# Patient Record
Sex: Male | Born: 1945
Health system: Southern US, Community
[De-identification: ages and names within clinical notes are randomized; demographics above are authoritative.]

## PROBLEM LIST (undated history)

## (undated) DIAGNOSIS — M1990 Unspecified osteoarthritis, unspecified site: Secondary | ICD-10-CM

## (undated) DIAGNOSIS — M109 Gout, unspecified: Secondary | ICD-10-CM

## (undated) DIAGNOSIS — Z8619 Personal history of other infectious and parasitic diseases: Secondary | ICD-10-CM

## (undated) DIAGNOSIS — H1850 Unspecified hereditary corneal dystrophies: Secondary | ICD-10-CM

## (undated) DIAGNOSIS — E119 Type 2 diabetes mellitus without complications: Secondary | ICD-10-CM

## (undated) DIAGNOSIS — E785 Hyperlipidemia, unspecified: Secondary | ICD-10-CM

## (undated) DIAGNOSIS — G629 Polyneuropathy, unspecified: Secondary | ICD-10-CM

## (undated) DIAGNOSIS — K219 Gastro-esophageal reflux disease without esophagitis: Secondary | ICD-10-CM

## (undated) DIAGNOSIS — I509 Heart failure, unspecified: Secondary | ICD-10-CM

## (undated) DIAGNOSIS — H18509 Unspecified hereditary corneal dystrophies, unspecified eye: Secondary | ICD-10-CM

## (undated) DIAGNOSIS — I219 Acute myocardial infarction, unspecified: Secondary | ICD-10-CM

## (undated) DIAGNOSIS — K5090 Crohn's disease, unspecified, without complications: Secondary | ICD-10-CM

## (undated) DIAGNOSIS — G4730 Sleep apnea, unspecified: Secondary | ICD-10-CM

## (undated) DIAGNOSIS — H18459 Nodular corneal degeneration, unspecified eye: Secondary | ICD-10-CM

## (undated) DIAGNOSIS — I2510 Atherosclerotic heart disease of native coronary artery without angina pectoris: Secondary | ICD-10-CM

## (undated) DIAGNOSIS — S301XXA Contusion of abdominal wall, initial encounter: Secondary | ICD-10-CM

## (undated) DIAGNOSIS — I701 Atherosclerosis of renal artery: Secondary | ICD-10-CM

## (undated) DIAGNOSIS — K635 Polyp of colon: Secondary | ICD-10-CM

## (undated) DIAGNOSIS — U071 COVID-19: Secondary | ICD-10-CM

## (undated) DIAGNOSIS — M4800 Spinal stenosis, site unspecified: Secondary | ICD-10-CM

## (undated) DIAGNOSIS — K279 Peptic ulcer, site unspecified, unspecified as acute or chronic, without hemorrhage or perforation: Secondary | ICD-10-CM

## (undated) DIAGNOSIS — Z87442 Personal history of urinary calculi: Secondary | ICD-10-CM

## (undated) DIAGNOSIS — I4891 Unspecified atrial fibrillation: Secondary | ICD-10-CM

## (undated) DIAGNOSIS — I499 Cardiac arrhythmia, unspecified: Secondary | ICD-10-CM

## (undated) DIAGNOSIS — N200 Calculus of kidney: Secondary | ICD-10-CM

## (undated) DIAGNOSIS — I10 Essential (primary) hypertension: Secondary | ICD-10-CM

## (undated) DIAGNOSIS — B019 Varicella without complication: Secondary | ICD-10-CM

## (undated) HISTORY — PX: TONSILLECTOMY: SUR1361

## (undated) HISTORY — PX: RENAL ARTERY STENT: SHX2321

## (undated) HISTORY — PX: JOINT REPLACEMENT: SHX530

## (undated) HISTORY — PX: CARDIAC CATHETERIZATION: SHX172

## (undated) HISTORY — PX: KNEE ARTHROSCOPY: SUR90

## (undated) HISTORY — DX: Varicella without complication: B01.9

## (undated) HISTORY — PX: CARDIOVERSION: SHX1299

## (undated) HISTORY — DX: Atherosclerotic heart disease of native coronary artery without angina pectoris: I25.10

## (undated) HISTORY — PX: CORONARY ANGIOPLASTY: SHX604

## (undated) HISTORY — PX: BACK SURGERY: SHX140

## (undated) HISTORY — DX: Contusion of abdominal wall, initial encounter: S30.1XXA

## (undated) HISTORY — PX: CARDIAC SURGERY: SHX584

## (undated) HISTORY — PX: CHOLECYSTECTOMY: SHX55

## (undated) HISTORY — DX: Polyp of colon: K63.5

## (undated) HISTORY — PX: CARDIAC ELECTROPHYSIOLOGY STUDY AND ABLATION: SHX1294

## (undated) HISTORY — DX: Calculus of kidney: N20.0

## (undated) HISTORY — PX: EYE SURGERY: SHX253

## (undated) HISTORY — DX: COVID-19: U07.1

## (undated) HISTORY — PX: FRACTURE SURGERY: SHX138

## (undated) HISTORY — DX: Type 2 diabetes mellitus without complications: E11.9

## (undated) HISTORY — PX: OTHER SURGICAL HISTORY: SHX169

## (undated) HISTORY — PX: CORONARY ARTERY BYPASS GRAFT: SHX141

---

## 2000-09-13 DIAGNOSIS — E119 Type 2 diabetes mellitus without complications: Secondary | ICD-10-CM

## 2000-09-13 HISTORY — DX: Type 2 diabetes mellitus without complications: E11.9

## 2003-09-14 HISTORY — PX: OTHER SURGICAL HISTORY: SHX169

## 2004-09-13 HISTORY — PX: ROTATOR CUFF REPAIR: SHX139

## 2009-12-17 ENCOUNTER — Ambulatory Visit: Payer: Self-pay | Admitting: Unknown Physician Specialty

## 2010-06-03 ENCOUNTER — Ambulatory Visit: Payer: Self-pay | Admitting: Gastroenterology

## 2010-09-13 DIAGNOSIS — H18459 Nodular corneal degeneration, unspecified eye: Secondary | ICD-10-CM

## 2010-09-13 HISTORY — DX: Nodular corneal degeneration, unspecified eye: H18.459

## 2010-09-13 HISTORY — PX: ABLATION: SHX5711

## 2010-11-16 ENCOUNTER — Ambulatory Visit: Payer: Self-pay

## 2010-12-13 ENCOUNTER — Ambulatory Visit: Payer: Self-pay

## 2011-05-10 ENCOUNTER — Encounter: Payer: Self-pay | Admitting: Internal Medicine

## 2011-05-12 ENCOUNTER — Encounter: Payer: Self-pay | Admitting: Internal Medicine

## 2011-05-15 ENCOUNTER — Encounter: Payer: Self-pay | Admitting: Internal Medicine

## 2011-06-14 ENCOUNTER — Encounter: Payer: Self-pay | Admitting: Internal Medicine

## 2011-06-30 DIAGNOSIS — I10 Essential (primary) hypertension: Secondary | ICD-10-CM | POA: Insufficient documentation

## 2011-06-30 DIAGNOSIS — I2510 Atherosclerotic heart disease of native coronary artery without angina pectoris: Secondary | ICD-10-CM | POA: Insufficient documentation

## 2011-06-30 DIAGNOSIS — E785 Hyperlipidemia, unspecified: Secondary | ICD-10-CM | POA: Insufficient documentation

## 2011-06-30 DIAGNOSIS — I4891 Unspecified atrial fibrillation: Secondary | ICD-10-CM | POA: Insufficient documentation

## 2011-07-01 DIAGNOSIS — I701 Atherosclerosis of renal artery: Secondary | ICD-10-CM | POA: Insufficient documentation

## 2011-07-15 ENCOUNTER — Encounter: Payer: Self-pay | Admitting: Internal Medicine

## 2011-07-29 DIAGNOSIS — Z7901 Long term (current) use of anticoagulants: Secondary | ICD-10-CM | POA: Insufficient documentation

## 2011-08-14 ENCOUNTER — Encounter: Payer: Self-pay | Admitting: Internal Medicine

## 2012-09-08 ENCOUNTER — Emergency Department: Payer: Self-pay | Admitting: Emergency Medicine

## 2013-04-23 ENCOUNTER — Ambulatory Visit: Payer: Self-pay

## 2013-04-23 LAB — CREATININE, SERUM: Creatinine: 1.16 mg/dL (ref 0.60–1.30)

## 2013-05-01 HISTORY — PX: OTHER SURGICAL HISTORY: SHX169

## 2013-05-03 HISTORY — PX: OTHER SURGICAL HISTORY: SHX169

## 2013-05-04 DIAGNOSIS — M109 Gout, unspecified: Secondary | ICD-10-CM | POA: Insufficient documentation

## 2013-07-03 ENCOUNTER — Other Ambulatory Visit: Payer: Self-pay | Admitting: Rheumatology

## 2013-07-03 LAB — SYNOVIAL CELL COUNT + DIFF, W/ CRYSTALS
Basophil: 0 %
Eosinophil: 0 %
Neutrophils: 7 %
Nucleated Cell Count: 470 /mm3
Other Cells BF: 0 %
Other Mononuclear Cells: 27 %

## 2014-06-18 ENCOUNTER — Ambulatory Visit: Payer: Self-pay | Admitting: Rheumatology

## 2014-06-18 LAB — SYNOVIAL CELL COUNT + DIFF, W/ CRYSTALS
Basophil: 0 %
Eosinophil: 0 %
Lymphocytes: 24 %
NEUTROS PCT: 52 %
Nucleated Cell Count: 94 /mm3
OTHER MONONUCLEAR CELLS: 24 %
Other Cells BF: 0 %

## 2014-07-15 DIAGNOSIS — E1142 Type 2 diabetes mellitus with diabetic polyneuropathy: Secondary | ICD-10-CM | POA: Insufficient documentation

## 2014-10-11 ENCOUNTER — Ambulatory Visit: Payer: Self-pay | Admitting: Psychiatry

## 2015-01-18 ENCOUNTER — Emergency Department
Admission: EM | Admit: 2015-01-18 | Discharge: 2015-01-18 | Disposition: A | Discharge status: Home / Self Care | Payer: Medicare Other | Attending: Emergency Medicine | Admitting: Emergency Medicine

## 2015-01-18 ENCOUNTER — Emergency Department: Payer: Medicare Other

## 2015-01-18 ENCOUNTER — Encounter: Payer: Self-pay | Admitting: Emergency Medicine

## 2015-01-18 DIAGNOSIS — Z794 Long term (current) use of insulin: Secondary | ICD-10-CM | POA: Insufficient documentation

## 2015-01-18 DIAGNOSIS — R202 Paresthesia of skin: Secondary | ICD-10-CM | POA: Diagnosis not present

## 2015-01-18 DIAGNOSIS — Z7901 Long term (current) use of anticoagulants: Secondary | ICD-10-CM | POA: Insufficient documentation

## 2015-01-18 DIAGNOSIS — R002 Palpitations: Secondary | ICD-10-CM

## 2015-01-18 DIAGNOSIS — Z79899 Other long term (current) drug therapy: Secondary | ICD-10-CM | POA: Diagnosis not present

## 2015-01-18 DIAGNOSIS — Z791 Long term (current) use of non-steroidal anti-inflammatories (NSAID): Secondary | ICD-10-CM | POA: Insufficient documentation

## 2015-01-18 DIAGNOSIS — Z7952 Long term (current) use of systemic steroids: Secondary | ICD-10-CM | POA: Diagnosis not present

## 2015-01-18 DIAGNOSIS — Z7982 Long term (current) use of aspirin: Secondary | ICD-10-CM | POA: Insufficient documentation

## 2015-01-18 HISTORY — DX: Acute myocardial infarction, unspecified: I21.9

## 2015-01-18 LAB — CBC
HEMATOCRIT: 37.7 % — AB (ref 40.0–52.0)
HEMOGLOBIN: 12.8 g/dL — AB (ref 13.0–18.0)
MCH: 33.8 pg (ref 26.0–34.0)
MCHC: 34 g/dL (ref 32.0–36.0)
MCV: 99.4 fL (ref 80.0–100.0)
Platelets: 214 10*3/uL (ref 150–440)
RBC: 3.8 MIL/uL — AB (ref 4.40–5.90)
RDW: 14.1 % (ref 11.5–14.5)
WBC: 9.2 10*3/uL (ref 3.8–10.6)

## 2015-01-18 LAB — BASIC METABOLIC PANEL
Anion gap: 12 (ref 5–15)
BUN: 23 mg/dL — ABNORMAL HIGH (ref 6–20)
CHLORIDE: 97 mmol/L — AB (ref 101–111)
CO2: 25 mmol/L (ref 22–32)
Calcium: 9 mg/dL (ref 8.9–10.3)
Creatinine, Ser: 0.98 mg/dL (ref 0.61–1.24)
GFR calc Af Amer: >60 mL/min (ref >60)
GFR calc non Af Amer: >60 mL/min (ref >60)
Glucose, Bld: 172 mg/dL — ABNORMAL HIGH (ref 65–99)
Potassium: 3.8 mmol/L (ref 3.5–5.1)
Sodium: 134 mmol/L — ABNORMAL LOW (ref 135–145)

## 2015-01-18 LAB — PROTIME-INR
INR: 2.32
Prothrombin Time: 25.6 seconds — ABNORMAL HIGH (ref 11.4–15.0)

## 2015-01-18 LAB — TROPONIN I: Troponin I: <0.03 ng/mL (ref <0.031)

## 2015-01-18 MED ORDER — ASPIRIN 81 MG PO CHEW
CHEWABLE_TABLET | ORAL | Status: AC
  Administered 2015-01-18: 324 mg via ORAL
  Filled 2015-01-18: qty 4

## 2015-01-18 MED ORDER — ASPIRIN 81 MG PO CHEW
324.0000 mg | CHEWABLE_TABLET | Freq: Once | ORAL | Status: AC
  Administered 2015-01-18: 324 mg via ORAL

## 2015-01-18 NOTE — ED Provider Notes (Signed)
[facility] Emergency Department Provider Note    ____________________________________________  Time seen: 5 AM  I have reviewed the triage vital signs and the nursing notes.   HISTORY  Chief Complaint Palpitations and Tingling       HPI Chase Cabrera is a 69 y.o. male who states she went to bed at 2 AM this morning, while getting into bed he noticed that his heart felt like it was pounding slightly hard but not fast. He then developed a feeling of tingling in both hands and around his lips. He denies chest pain or trouble breathing. He feels improved now in the ER. Patient does state that he had a prior MI approximately 10 years ago where he had tingling in his left arm, but he is also had MI where he felt pressure in his chest and does not feel those symptoms today.He denies trouble with shortness of breath while lying flat, he denies swelling in the legs. He occasionally feels winded while walking, but this is not unusual for him.  Patient notes that he follows up regularly with cardiology at [facility].   Patient denies fever, cough. No pain.  Past Medical History  Diagnosis Date  . Myocardial infarction    non-ST elevation MI, atrial fibrillation status post ablation, diabetes  There are no active problems to display for this patient.   History reviewed. No pertinent past surgical history.  Current Outpatient Rx  Name  Route  Sig  Dispense  Refill  . aspirin EC 81 MG tablet   Oral   Take 1 tablet by mouth daily.         Marland Kitchen loratadine (CLARITIN) 10 MG tablet   Oral   Take 1 tablet by mouth daily before breakfast.         . Multiple Vitamin (MULTI-VITAMINS) TABS   Oral   Take 1 tablet by mouth daily.         . nitroGLYCERIN (NITROSTAT) 0.4 MG SL tablet   Sublingual   Place 1 tablet under the tongue every 5 (five) minutes x 3 doses as needed. For chest pain.  Call 911 if no relief after 3 tablets         . vitamin C  (ASCORBIC ACID) 500 MG tablet   Oral   Take 1,000 mg by mouth daily.         Marland Kitchen amLODipine (NORVASC) 10 MG tablet   Oral   Take 1 tablet by mouth daily.         . calcium carbonate (OS-CAL - DOSED IN MG OF ELEMENTAL CALCIUM) 1250 (500 CA) MG tablet   Oral   Take 500 mg by mouth 2 (two) times daily.         . carvedilol (COREG) 25 MG tablet            1   . colchicine 0.6 MG tablet   Oral   Take 1 tablet by mouth daily.      0   . diclofenac sodium (VOLTAREN) 1 % GEL   Topical   Apply 2 g topically 4 (four) times daily.         Marland Kitchen diltiazem (CARDIZEM) 30 MG tablet   Oral   Take 30 mg by mouth daily as needed. For afib         . furosemide (LASIX) 20 MG tablet   Oral   Take 1 tablet by mouth daily.      0   . gabapentin (  NEURONTIN) 300 MG capsule   Oral   Take 1 capsule by mouth 3 (three) times daily.         Marland Kitchen HUMALOG MIX 75/25 KWIKPEN (75-25) 100 UNIT/ML Kwikpen   Subcutaneous   Inject 20-40 Units into the skin 2 (two) times daily. 20 units subcutaneous before breakfast and 40 units before supper.      0     Dispense as written.   . hydrALAZINE (APRESOLINE) 100 MG tablet   Oral   Take 1 tablet by mouth 3 (three) times daily.         . hydrochlorothiazide (HYDRODIURIL) 25 MG tablet   Oral   Take 1 tablet by mouth daily.         Marland Kitchen HYDROcodone-acetaminophen (NORCO/VICODIN) 5-325 MG per tablet   Oral   Take 1 tablet by mouth every 6 (six) hours as needed. pain      0   . isosorbide mononitrate (IMDUR) 120 MG 24 hr tablet   Oral   Take 1 tablet by mouth daily.         Marland Kitchen JANUVIA 100 MG tablet   Oral   Take 1 tablet by mouth daily.           Dispense as written.   Marland Kitchen LYRICA 75 MG capsule   Oral   Take 75 mg by mouth 2 (two) times daily.      0     Dispense as written.   . metFORMIN (GLUCOPHAGE) 1000 MG tablet   Oral   Take 1 tablet by mouth 2 (two) times daily.      0   . Omega-3 Fatty Acids (FISH OIL) 1000 MG CAPS    Oral   Take 2 capsules by mouth 2 (two) times daily.         Marland Kitchen omeprazole (PRILOSEC) 40 MG capsule   Oral   Take 1 capsule by mouth 2 (two) times daily.         . potassium chloride (K-DUR,KLOR-CON) 10 MEQ tablet               . pravastatin (PRAVACHOL) 40 MG tablet   Oral   Take 40 mg by mouth daily.      0   . predniSONE (DELTASONE) 10 MG tablet   Oral   Take 10-60 mg by mouth daily. 6 tabs by mouth, day 1, then Decrease by 34m (1 tab) daily until goned      0   . probenecid (BENEMID) 500 MG tablet   Oral   Take 1 tablet by mouth 2 (two) times daily.      0   . tamsulosin (FLOMAX) 0.4 MG CAPS capsule   Oral   Take 1 capsule by mouth daily.         . valACYclovir (VALTREX) 1000 MG tablet   Oral   Take 1,000 mg by mouth 3 (three) times daily.      0   . warfarin (COUMADIN) 3 MG tablet   Oral   Take 1 tablet by mouth daily.      0     Allergies Allopurinol; Atenolol; Atorvastatin; Ramipril; Simvastatin; and Valsartan  History reviewed. No pertinent family history.  Social History History  Substance Use Topics  . Smoking status: Never Smoker   . Smokeless tobacco: Never Used  . Alcohol Use: No    Review of Systems  Constitutional: Negative for fever. Eyes: Negative for visual changes. ENT: Negative for sore throat. Cardiovascular: Negative  for chest pain. Respiratory: Negative for shortness of breath. Gastrointestinal: Negative for abdominal pain, vomiting and diarrhea. Genitourinary: Negative for dysuria. Musculoskeletal: Negative for back pain. Skin: Negative for rash. Neurological: Negative for headaches, focal weakness. Felt tingling in both hands which lasted approximately one hour and around his lips, this is resolved. There is no weakness in any one arm or leg or in his face. No problems speaking. No headache.  10-point ROS otherwise negative.  ____________________________________________   PHYSICAL EXAM:  VITAL SIGNS: ED  Triage Vitals  Enc Vitals Group     BP 01/18/15 0415 144/69 mmHg     Pulse Rate 01/18/15 0415 55     Resp 01/18/15 0415 17     Temp 01/18/15 0415 97.6 F (36.4 C)     Temp Source 01/18/15 0415 Oral     SpO2 01/18/15 0415 93 %     Weight 01/18/15 0415 190 lb (86.183 kg)     Height 01/18/15 0415 5' 8"  (1.727 m)     Head Cir --      Peak Flow --      Pain Score 01/18/15 0416 0     Pain Loc --      Pain Edu? --      Excl. in [place]? --      Constitutional: Alert and oriented. Well appearing and in no distress. Eyes: Conjunctivae are normal. PERRL. Normal extraocular movements. ENT   Head: Normocephalic and atraumatic.    Neck: No stridor. Cardiovascular: Normal rate, regular rhythm. Normal and symmetric distal pulses are present in all extremities. No murmurs, rubs, or gallops. Respiratory: Normal respiratory effort without tachypnea nor retractions. Breath sounds are clear and equal bilaterally. No wheezes/rales/rhonchi. Gastrointestinal: Soft and nontender. No distention. No abdominal bruits. There is no CVA tenderness. Genitourinary:  Musculoskeletal: Nontender with normal range of motion in all extremities. No joint effusions.  No lower extremity tenderness nor edema. Neurologic:  Normal speech and language. No gross focal neurologic deficits are appreciated. Speech is normal. No gait instability. Skin:  Skin is warm, dry and intact. No rash noted. Psychiatric: Mood and affect are normal. Speech and behavior are normal. Patient exhibits appropriate insight and judgment.  ____________________________________________    LABS (pertinent positives/negatives)  Labs Reviewed  CBC - Abnormal; Notable for the following:    RBC 3.80 (*)    Hemoglobin 12.8 (*)    HCT 37.7 (*)    All other components within normal limits  BASIC METABOLIC PANEL - Abnormal; Notable for the following:    Sodium 134 (*)    Chloride 97 (*)    Glucose, Bld 172 (*)    BUN 23 (*)    All other  components within normal limits  PROTIME-INR - Abnormal; Notable for the following:    Prothrombin Time 25.6 (*)    All other components within normal limits  TROPONIN I  TROPONIN I     ____________________________________________   EKG   Date: 01/18/2015  Rate: 58  Rhythm: normal sinus rhythm  QRS Axis: normal  Intervals: normal  ST/T Wave abnormalities: normal  Conduction Disutrbances: none  Narrative Interpretation: unremarkable      ____________________________________________    RADIOLOGY  Cardiomegaly, clear lungs  ____________________________________________   PROCEDURES  Procedure(s) performed: None  Critical Care performed: No  ____________________________________________   INITIAL IMPRESSION / ASSESSMENT AND PLAN / ED COURSE  Pertinent labs & imaging results that were available during my care of the patient were reviewed by me and considered in  my medical decision making (see chart for details).  Patient presents with palpitations. In the ER his symptoms are improved. He denies rapid heart rate or other associated symptoms other than a feeling of tingling in bilateral arms and face. His neurologic and cardiac exam at present are normal. He doesn't along history of cardiac disease, but these seem very atypical for ACS symptoms. Given his history I did discuss with on-call Dr. Cipriano Bunker at [facility] for cardiology. Patient does have a history of non-ST elevation MI, atrial fibrillation, and is a long-standing [facility] cardiology patient. After our discussion and given the patient's symptoms normal EKG and first negative troponin decision was made that it would be reasonable to check a second troponin, if negative we will discharge the patient given he has no chest pain or other ACS symptoms at this time. His palpitations are resolved.  I discussed with the patient, he is comfortable with this plan and will follow return precautions which I have discussed with him. In  addition he will call [facility] cardiology tomorrow to set up follow-up appointment this week in the clinic.  ____________________________________________   FINAL CLINICAL IMPRESSION(S) / ED DIAGNOSES  Final diagnoses:  None     Delman Kitten, MD 01/18/15 [phone]

## 2015-01-18 NOTE — ED Notes (Signed)
Pt to rm 3 via EMS.  EMS reports pt c/o heart palpitations and tingling in fingers since 2am.  Pt hx of 6 MIs.  EKG performed.

## 2015-01-18 NOTE — Discharge Instructions (Signed)
As we have discussed please follow up with [facility] cardiology by calling to set up an appointment for this week tomorrow.  Please return to the ER right away if he developed chest pain, difficulty breathing, rapid heart rate, weakness, nausea, vomiting or other symptoms which are concerning.  Palpitations A palpitation is the feeling that your heartbeat is irregular or is faster than normal. It may feel like your heart is fluttering or skipping a beat. Palpitations are usually not a serious problem. However, in some cases, you may need further medical evaluation. CAUSES  Palpitations can be caused by:  Smoking.  Caffeine or other stimulants, such as diet pills or energy drinks.  Alcohol.  Stress and anxiety.  Strenuous physical activity.  Fatigue.  Certain medicines.  Heart disease, especially if you have a history of irregular heart rhythms (arrhythmias), such as atrial fibrillation, atrial flutter, or supraventricular tachycardia.  An improperly working pacemaker or defibrillator. DIAGNOSIS  To find the cause of your palpitations, your health care provider will take your medical history and perform a physical exam. Your health care provider may also have you take a test called an ambulatory electrocardiogram (ECG). An ECG records your heartbeat patterns over a 24-hour period. You may also have other tests, such as:  Transthoracic echocardiogram (TTE). During echocardiography, sound waves are used to evaluate how blood flows through your heart.  Transesophageal echocardiogram (TEE).  Cardiac monitoring. This allows your health care provider to monitor your heart rate and rhythm in real time.  Holter monitor. This is a portable device that records your heartbeat and can help diagnose heart arrhythmias. It allows your health care provider to track your heart activity for several days, if needed.  Stress tests by exercise or by giving medicine that makes the heart beat  faster. TREATMENT  Treatment of palpitations depends on the cause of your symptoms and can vary greatly. Most cases of palpitations do not require any treatment other than time, relaxation, and monitoring your symptoms. Other causes, such as atrial fibrillation, atrial flutter, or supraventricular tachycardia, usually require further treatment. HOME CARE INSTRUCTIONS   Avoid:  Caffeinated coffee, tea, soft drinks, diet pills, and energy drinks.  Chocolate.  Alcohol.  Stop smoking if you smoke.  Reduce your stress and anxiety. Things that can help you relax include:  A method of controlling things in your body, such as your heartbeats, with your mind (biofeedback).  Yoga.  Meditation.  Physical activity such as swimming, jogging, or walking.  Get plenty of rest and sleep. SEEK MEDICAL CARE IF:   You continue to have a fast or irregular heartbeat beyond 24 hours.  Your palpitations occur more often. SEEK IMMEDIATE MEDICAL CARE IF:  You have chest pain or shortness of breath.  You have a severe headache.  You feel dizzy or you faint. MAKE SURE YOU:  Understand these instructions.  Will watch your condition.  Will get help right away if you are not doing well or get worse. Document Released: 08/27/2000 Document Revised: 09/04/2013 Document Reviewed: 10/29/2011 [employer] Patient Information 2015 [place], [place]. This information is not intended to replace advice given to you by your health care provider. Make sure you discuss any questions you have with your health care provider.

## 2015-01-18 NOTE — ED Provider Notes (Signed)
Patient's second troponin has not yet returned.  I have signed out plan of care to follow-up on second troponin with Dr. Rip Harbour. If negative and patient remains asymptomatic we will discharge the patient to follow-up with his cardiology team at [facility].  Delman Kitten, MD 01/18/15 [phone]

## 2015-03-14 HISTORY — PX: OTHER SURGICAL HISTORY: SHX169

## 2015-03-28 HISTORY — PX: CORONARY ANGIOPLASTY WITH STENT PLACEMENT: SHX49

## 2015-05-08 ENCOUNTER — Emergency Department
Admission: EM | Admit: 2015-05-08 | Discharge: 2015-05-09 | Disposition: A | Discharge status: Home / Self Care | Payer: Medicare Other | Attending: Student | Admitting: Student

## 2015-05-08 ENCOUNTER — Encounter: Payer: Self-pay | Admitting: Emergency Medicine

## 2015-05-08 DIAGNOSIS — Z79899 Other long term (current) drug therapy: Secondary | ICD-10-CM | POA: Diagnosis not present

## 2015-05-08 DIAGNOSIS — Z79811 Long term (current) use of aromatase inhibitors: Secondary | ICD-10-CM | POA: Diagnosis not present

## 2015-05-08 DIAGNOSIS — Y9389 Activity, other specified: Secondary | ICD-10-CM | POA: Insufficient documentation

## 2015-05-08 DIAGNOSIS — Z791 Long term (current) use of non-steroidal anti-inflammatories (NSAID): Secondary | ICD-10-CM | POA: Diagnosis not present

## 2015-05-08 DIAGNOSIS — Z7982 Long term (current) use of aspirin: Secondary | ICD-10-CM | POA: Diagnosis not present

## 2015-05-08 DIAGNOSIS — E119 Type 2 diabetes mellitus without complications: Secondary | ICD-10-CM | POA: Insufficient documentation

## 2015-05-08 DIAGNOSIS — X58XXXA Exposure to other specified factors, initial encounter: Secondary | ICD-10-CM | POA: Insufficient documentation

## 2015-05-08 DIAGNOSIS — Z794 Long term (current) use of insulin: Secondary | ICD-10-CM | POA: Diagnosis not present

## 2015-05-08 DIAGNOSIS — Z7901 Long term (current) use of anticoagulants: Secondary | ICD-10-CM | POA: Insufficient documentation

## 2015-05-08 DIAGNOSIS — S01512A Laceration without foreign body of oral cavity, initial encounter: Secondary | ICD-10-CM | POA: Diagnosis present

## 2015-05-08 DIAGNOSIS — Y9289 Other specified places as the place of occurrence of the external cause: Secondary | ICD-10-CM | POA: Insufficient documentation

## 2015-05-08 DIAGNOSIS — Y998 Other external cause status: Secondary | ICD-10-CM | POA: Diagnosis not present

## 2015-05-08 HISTORY — DX: Unspecified osteoarthritis, unspecified site: M19.90

## 2015-05-08 LAB — CBC WITH DIFFERENTIAL/PLATELET
BASOS ABS: 0 10*3/uL (ref 0–0.1)
BASOS PCT: 1 %
Eosinophils Absolute: 0.2 10*3/uL (ref 0–0.7)
Eosinophils Relative: 4 %
HEMATOCRIT: 38.2 % — AB (ref 40.0–52.0)
HEMOGLOBIN: 13 g/dL (ref 13.0–18.0)
Lymphocytes Relative: 28 %
Lymphs Abs: 1.8 10*3/uL (ref 1.0–3.6)
MCH: 32.5 pg (ref 26.0–34.0)
MCHC: 34 g/dL (ref 32.0–36.0)
MCV: 95.4 fL (ref 80.0–100.0)
Monocytes Absolute: 0.8 10*3/uL (ref 0.2–1.0)
Monocytes Relative: 12 %
NEUTROS ABS: 3.8 10*3/uL (ref 1.4–6.5)
NEUTROS PCT: 57 %
Platelets: 214 10*3/uL (ref 150–440)
RBC: 4 MIL/uL — ABNORMAL LOW (ref 4.40–5.90)
RDW: 13.8 % (ref 11.5–14.5)
WBC: 6.7 10*3/uL (ref 3.8–10.6)

## 2015-05-08 LAB — PROTIME-INR
INR: 1.89
Prothrombin Time: 21.9 seconds — ABNORMAL HIGH (ref 11.4–15.0)

## 2015-05-08 LAB — APTT: APTT: 35 s (ref 24–36)

## 2015-05-08 MED ORDER — LIDOCAINE-EPINEPHRINE (PF) 1 %-1:200000 IJ SOLN
INTRAMUSCULAR | Status: AC
  Administered 2015-05-08: 5 mL
  Filled 2015-05-08: qty 30

## 2015-05-08 MED ORDER — LIDOCAINE-EPINEPHRINE 2 %-1:100000 IJ SOLN
30.0000 mL | Freq: Once | INTRAMUSCULAR | Status: AC
  Administered 2015-05-09: 30 mL via INTRADERMAL

## 2015-05-08 NOTE — ED Notes (Signed)
Labs collected via right AC using butterfly needle. Patient tolerated well. Bandage applied.

## 2015-05-08 NOTE — Discharge Instructions (Signed)
Return if you develop recurrent uncontrolled bleeding from your tongue, if your tongue becomes very swollen, if your stitches are draining pus, you're having fevers, you have difficulty breathing, chest pain, vomiting, or for any other concerns.

## 2015-05-08 NOTE — ED Provider Notes (Signed)
[facility] Emergency Department Provider Note  ____________________________________________  Time seen: Approximately 9:23 PM  I have reviewed the triage vital signs and the nursing notes.   HISTORY  Chief Complaint Facial Laceration    HPI Chase Cabrera is a 69 y.o. male with history of coronary artery disease on warfarin, diabetes who presents for evaluation of tongue laceration. Patient was eating pizza at approximately 12:30 PM when he bit his tongue. He has had bleeding from the tongue ever since. This happened suddenly, bleeding has been constant. Currently his symptoms are moderate. No modifying factors. He is otherwise been in his usual state of health. No chest pain, no difficulty breathing, no recent illness.   Past Medical History  Diagnosis Date  . Myocardial infarction   . Arthritis   . Diabetes mellitus without complication   . Cancer     There are no active problems to display for this patient.   Past Surgical History  Procedure Laterality Date  . Cardiac surgery    . Gallblader      Current Outpatient Rx  Name  Route  Sig  Dispense  Refill  . amLODipine (NORVASC) 10 MG tablet   Oral   Take 1 tablet by mouth daily.         Marland Kitchen aspirin EC 81 MG tablet   Oral   Take 1 tablet by mouth daily.         . calcium carbonate (OS-CAL - DOSED IN MG OF ELEMENTAL CALCIUM) 1250 (500 CA) MG tablet   Oral   Take 500 mg by mouth 2 (two) times daily.         . carvedilol (COREG) 25 MG tablet            1   . colchicine 0.6 MG tablet   Oral   Take 1 tablet by mouth daily.      0   . diclofenac sodium (VOLTAREN) 1 % GEL   Topical   Apply 2 g topically 4 (four) times daily.         Marland Kitchen diltiazem (CARDIZEM) 30 MG tablet   Oral   Take 30 mg by mouth daily as needed. For afib         . furosemide (LASIX) 20 MG tablet   Oral   Take 1 tablet by mouth daily.      0   . gabapentin (NEURONTIN) 300 MG capsule   Oral   Take  1 capsule by mouth 3 (three) times daily.         Marland Kitchen HUMALOG MIX 75/25 KWIKPEN (75-25) 100 UNIT/ML Kwikpen   Subcutaneous   Inject 20-40 Units into the skin 2 (two) times daily. 20 units subcutaneous before breakfast and 40 units before supper.      0     Dispense as written.   . hydrALAZINE (APRESOLINE) 100 MG tablet   Oral   Take 1 tablet by mouth 3 (three) times daily.         . hydrochlorothiazide (HYDRODIURIL) 25 MG tablet   Oral   Take 1 tablet by mouth daily.         Marland Kitchen HYDROcodone-acetaminophen (NORCO/VICODIN) 5-325 MG per tablet   Oral   Take 1 tablet by mouth every 6 (six) hours as needed. pain      0   . isosorbide mononitrate (IMDUR) 120 MG 24 hr tablet   Oral   Take 1 tablet by mouth daily.         Marland Kitchen  JANUVIA 100 MG tablet   Oral   Take 1 tablet by mouth daily.           Dispense as written.   . loratadine (CLARITIN) 10 MG tablet   Oral   Take 1 tablet by mouth daily before breakfast.         . LYRICA 75 MG capsule   Oral   Take 75 mg by mouth 2 (two) times daily.      0     Dispense as written.   . metFORMIN (GLUCOPHAGE) 1000 MG tablet   Oral   Take 1 tablet by mouth 2 (two) times daily.      0   . Multiple Vitamin (MULTI-VITAMINS) TABS   Oral   Take 1 tablet by mouth daily.         Marland Kitchen EXPIRED: nitroGLYCERIN (NITROSTAT) 0.4 MG SL tablet   Sublingual   Place 1 tablet under the tongue every 5 (five) minutes x 3 doses as needed. For chest pain.  Call 911 if no relief after 3 tablets         . Omega-3 Fatty Acids (FISH OIL) 1000 MG CAPS   Oral   Take 2 capsules by mouth 2 (two) times daily.         Marland Kitchen omeprazole (PRILOSEC) 40 MG capsule   Oral   Take 1 capsule by mouth 2 (two) times daily.         . potassium chloride (K-DUR,KLOR-CON) 10 MEQ tablet               . pravastatin (PRAVACHOL) 40 MG tablet   Oral   Take 40 mg by mouth daily.      0   . predniSONE (DELTASONE) 10 MG tablet   Oral   Take 10-60 mg by  mouth daily. 6 tabs by mouth, day 1, then Decrease by 81m (1 tab) daily until goned      0   . probenecid (BENEMID) 500 MG tablet   Oral   Take 1 tablet by mouth 2 (two) times daily.      0   . tamsulosin (FLOMAX) 0.4 MG CAPS capsule   Oral   Take 1 capsule by mouth daily.         . valACYclovir (VALTREX) 1000 MG tablet   Oral   Take 1,000 mg by mouth 3 (three) times daily.      0   . vitamin C (ASCORBIC ACID) 500 MG tablet   Oral   Take 1,000 mg by mouth daily.         .Marland Kitchenwarfarin (COUMADIN) 3 MG tablet   Oral   Take 1 tablet by mouth daily.      0     Allergies Allopurinol; Atenolol; Atorvastatin; Ramipril; Simvastatin; and Valsartan  No family history on file.  Social History Social History  Substance Use Topics  . Smoking status: Never Smoker   . Smokeless tobacco: Never Used  . Alcohol Use: No    Review of Systems Constitutional: No fever/chills Eyes: No visual changes. ENT: No sore throat. Cardiovascular: Denies chest pain. Respiratory: Denies shortness of breath. Gastrointestinal: No abdominal pain.  No nausea, no vomiting.  No diarrhea.  No constipation. Genitourinary: Negative for dysuria. Musculoskeletal: Negative for back pain. Skin: Negative for rash. Neurological: Negative for headaches, focal weakness or numbness.  10-point ROS otherwise negative.  ____________________________________________   PHYSICAL EXAM:  VITAL SIGNS: ED Triage Vitals  Enc Vitals Group  BP 05/08/15 2123 155/66 mmHg     Pulse Rate 05/08/15 2123 59     Resp --      Temp 05/08/15 2123 98.2 F (36.8 C)     Temp Source 05/08/15 2123 Oral     SpO2 05/08/15 2123 94 %     Weight 05/08/15 2123 185 lb (83.915 kg)     Height 05/08/15 2123 5' 8"  (1.727 m)     Head Cir --      Peak Flow --      Pain Score 05/08/15 2124 0     Pain Loc --      Pain Edu? --      Excl. in [place]? --     Constitutional: Alert and oriented. Well appearing and in no acute  distress. Eyes: Conjunctivae are normal. PERRL. EOMI. Head: Atraumatic. Nose: No congestion/rhinnorhea. Mouth/Throat: many superficial tiny small bleeding tongue lacerations, there is a 0.5 cm deeper tongue laceration in the midline but no lacerations are through and through Neck: No stridor.  Cardiovascular: Normal rate, regular rhythm. Grossly normal heart sounds.  Good peripheral circulation. Respiratory: Normal respiratory effort.  No retractions. Lungs CTAB. Gastrointestinal: Soft and nontender. No distention. No abdominal bruits. No CVA tenderness. Genitourinary: deferred Musculoskeletal: No lower extremity tenderness nor edema.  No joint effusions. Neurologic:  Normal speech and language. No gross focal neurologic deficits are appreciated. No gait instability. Skin:  Skin is warm, dry and intact. No rash noted. Psychiatric: Mood and affect are normal. Speech and behavior are normal.  ____________________________________________   LABS (all labs ordered are listed, but only abnormal results are displayed)  Labs Reviewed  CBC WITH DIFFERENTIAL/PLATELET - Abnormal; Notable for the following:    RBC 4.00 (*)    HCT 38.2 (*)    All other components within normal limits  PROTIME-INR - Abnormal; Notable for the following:    Prothrombin Time 21.9 (*)    All other components within normal limits  APTT   ____________________________________________  EKG  none ____________________________________________  RADIOLOGY  none ____________________________________________   PROCEDURES  Procedure(s) performed:   LACERATION REPAIR Performed by: Joanne Gavel Authorized by: Loura Pardon A Consent: Verbal consent obtained. Risks and benefits: risks, benefits and alternatives were discussed Consent given by: patient Patient identity confirmed: provided demographic data Prepped and Draped in normal sterile fashion Wound explored  Laceration Location: tongue  Laceration  Length: multiple lacs less than 0.25 cm, 1 larger lac 0.5 cm  No Foreign Bodies seen or palpated  Anesthesia: local infiltration  Local anesthetic: lidocaine 1% with epinephrine  Anesthetic total: 4 ml  Irrigation method: syringe Amount of cleaning: standard  Skin closure: 4-0 and 5-0 vicryl  Number of sutures: 10  Technique: simple interrupted  Patient tolerance: Patient tolerated the procedure well with no immediate complications.  Critical Care performed: No  ____________________________________________   INITIAL IMPRESSION / ASSESSMENT AND PLAN / ED COURSE  Pertinent labs & imaging results that were available during my care of the patient were reviewed by me and considered in my medical decision making (see chart for details).  Hasnain Manheim is a 70 y.o. male with history of coronary artery disease on warfarin, diabetes who presents for evaluation of tongue laceration. On exam, he has steady oozing of blood from several superficial tongue lacerations as well as a deeper tongue laceration though none are through and through. I placed multiple simple interrupted stitches with 4-0 and 5-0 Vicryl suture. Bleeding controlled at this time. We'll check CBC,  PT/INR, PTT.  ----------------------------------------- 11:53 PM on 05/08/2015 -----------------------------------------  Patient is resting comfortably. No bleeding from his tongue at this time. Hemoglobin 13.0. INR not supratherapeutic at 1.89. Discussed return precautions and he is comfortable with the discharge plan. Will have him follow-up with ENT as needed. ____________________________________________   FINAL CLINICAL IMPRESSION(S) / ED DIAGNOSES  Final diagnoses:  Tongue laceration, initial encounter      Joanne Gavel, MD 05/08/15 2355

## 2015-05-08 NOTE — ED Notes (Signed)
PT EATING PIZZA FOR LUNCH AROUND NOON AND BIT HIS TONGUE. HE IS ON BLOOD THINNER. BLEEDING STEADLIY SINCE NOON.

## 2015-05-12 ENCOUNTER — Ambulatory Visit: Payer: Medicare Other

## 2015-06-05 ENCOUNTER — Encounter: Payer: Medicare Other | Attending: Internal Medicine | Admitting: *Deleted

## 2015-06-05 VITALS — Ht 66.5 in | Wt 190.4 lb

## 2015-06-05 DIAGNOSIS — Z9889 Other specified postprocedural states: Secondary | ICD-10-CM | POA: Diagnosis not present

## 2015-06-05 DIAGNOSIS — Z9861 Coronary angioplasty status: Secondary | ICD-10-CM | POA: Insufficient documentation

## 2015-06-05 LAB — GLUCOSE, CAPILLARY: GLUCOSE-CAPILLARY: 235 mg/dL — AB (ref 65–99)

## 2015-06-06 NOTE — Patient Instructions (Signed)
Patient Instructions  Patient Details  Name: Chase Cabrera MRN: [id number] Date of Birth: 03-Jun-1946 Referring Provider:  Concha Pyo, MD  Below are the personal goals you chose as well as exercise and nutrition goals. Our goal is to help you keep on track towards obtaining and maintaining your goals. We will be discussing your progress on these goals with you throughout the program.  Initial Exercise Prescription:     Initial Exercise Prescription - 06/05/15 1400    Date of Initial Exercise Prescription   Date 06/05/15   Treadmill   MPH 0.8   Grade 0   Minutes 10   Recumbant Bike   Level 1   RPM 40   Watts 20   Minutes 10   NuStep   Level 2   Watts 40   Minutes 10   Arm Ergometer   Level 1   Watts 10   Minutes 10   REL-XR   Level 1   Watts 40   Minutes 10   Prescription Details   Frequency (times per week) 3   Duration Progress to 30 minutes of continuous aerobic without signs/symptoms of physical distress   Intensity   THRR REST +  30   Ratings of Perceived Exertion 11-15   Perceived Dyspnea 2-4   Progression Continue progressive overload as per policy without signs/symptoms or physical distress.   Resistance Training   Training Prescription Yes   Weight 2   Reps 10-15      Exercise Goals: Frequency: Be able to perform aerobic exercise three times per week working toward 3-5 days per week.  Intensity: Work with a perceived exertion of 11 (fairly light) - 15 (hard) as tolerated. Follow your new exercise prescription and watch for changes in prescription as you progress with the program. Changes will be reviewed with you when they are made.  Duration: You should be able to do 30 minutes of continuous aerobic exercise in addition to a 5 minute warm-up and a 5 minute cool-down routine.  Nutrition Goals: Your personal nutrition goals will be established when you do your nutrition analysis with the dietician.  The following are nutrition guidelines to  follow: Cholesterol < 238m/day Sodium < 15051mday Fiber: Men over 50 yrs - 30 grams per day  Personal Goals:     Personal Goals and Risk Factors at Admission - 06/06/15 1145    Personal Goals and Risk Factors on Admission    Weight Management Yes   Intervention Learn and follow the exercise and diet guidelines while in the program. Utilize the nutrition and education classes to help gain knowledge of the diet and exercise expectations in the program   Increase Aerobic Exercise and Physical Activity Yes   Intervention While in program, learn and follow the exercise prescription taught. Start at a low level workload and increase workload after able to maintain previous level for 30 minutes. Increase time before increasing intensity.   Understand more about Heart/Pulmonary Disease. Yes   Intervention While in program utilize professionals for any questions, and attend the education sessions. Great websites to use are www.americanheart.org or www.lung.org for reliable information.   Increase knowledge of respiratory medications and ability to use respiratory devices properly.  Yes   Diabetes Yes   Goal Blood glucose control identified by blood glucose values, HgbA1C. Participant verbalizes understanding of the signs/symptoms of hyper/hypo glycemia, proper foot care and importance of medication and nutrition plan for blood glucose control.   Intervention Provide nutrition & aerobic exercise along  with prescribed medications to achieve blood glucose in normal ranges: Fasting 65-99 mg/dL   Hypertension Yes   Goal Participant will see blood pressure controlled within the values of 140/74m/Hg or within value directed by their physician.   Intervention Provide nutrition & aerobic exercise along with prescribed medications to achieve BP 140/90 or less.      Tobacco Use Initial Evaluation: History  Smoking status  . Never Smoker   Smokeless tobacco  . Never Used    Copy of goals given to  participant.

## 2015-06-06 NOTE — Progress Notes (Signed)
Cardiac Individual Treatment Plan  Patient Details  Name: Chase Cabrera MRN: [id number] Date of Birth: Nov 22, 1945 Referring Quantez Schnyder:  Concha Pyo, MD  Initial Encounter Date: Date: 06/05/15  Visit Diagnosis: No diagnosis found.  Patient's Home Medications on Admission:  Current outpatient prescriptions:  .  amLODipine (NORVASC) 10 MG tablet, Take 1 tablet by mouth daily., Disp: , Rfl:  .  aspirin EC 81 MG tablet, Take 1 tablet by mouth daily., Disp: , Rfl:  .  calcium carbonate (OS-CAL - DOSED IN MG OF ELEMENTAL CALCIUM) 1250 (500 CA) MG tablet, Take 500 mg by mouth 2 (two) times daily., Disp: , Rfl:  .  carvedilol (COREG) 25 MG tablet, , Disp: , Rfl: 1 .  colchicine 0.6 MG tablet, Take 1 tablet by mouth daily., Disp: , Rfl: 0 .  diclofenac sodium (VOLTAREN) 1 % GEL, Apply 2 g topically 4 (four) times daily., Disp: , Rfl:  .  diltiazem (CARDIZEM) 30 MG tablet, Take 30 mg by mouth daily as needed. For afib, Disp: , Rfl:  .  furosemide (LASIX) 20 MG tablet, Take 1 tablet by mouth daily., Disp: , Rfl: 0 .  gabapentin (NEURONTIN) 300 MG capsule, Take 1 capsule by mouth 3 (three) times daily., Disp: , Rfl:  .  HUMALOG MIX 75/25 KWIKPEN (75-25) 100 UNIT/ML Kwikpen, Inject 20-40 Units into the skin 2 (two) times daily. 20 units subcutaneous before breakfast and 40 units before supper., Disp: , Rfl: 0 .  hydrALAZINE (APRESOLINE) 100 MG tablet, Take 1 tablet by mouth 3 (three) times daily., Disp: , Rfl:  .  hydrochlorothiazide (HYDRODIURIL) 25 MG tablet, Take 1 tablet by mouth daily., Disp: , Rfl:  .  HYDROcodone-acetaminophen (NORCO/VICODIN) 5-325 MG per tablet, Take 1 tablet by mouth every 6 (six) hours as needed. pain, Disp: , Rfl: 0 .  isosorbide mononitrate (IMDUR) 120 MG 24 hr tablet, Take 1 tablet by mouth daily., Disp: , Rfl:  .  JANUVIA 100 MG tablet, Take 1 tablet by mouth daily., Disp: , Rfl:  .  loratadine (CLARITIN) 10 MG tablet, Take 1 tablet by mouth daily before breakfast.,  Disp: , Rfl:  .  LYRICA 75 MG capsule, Take 75 mg by mouth 2 (two) times daily., Disp: , Rfl: 0 .  metFORMIN (GLUCOPHAGE) 1000 MG tablet, Take 1 tablet by mouth 2 (two) times daily., Disp: , Rfl: 0 .  Multiple Vitamin (MULTI-VITAMINS) TABS, Take 1 tablet by mouth daily., Disp: , Rfl:  .  nitroGLYCERIN (NITROSTAT) 0.4 MG SL tablet, Place 1 tablet under the tongue every 5 (five) minutes x 3 doses as needed. For chest pain.  Call 911 if no relief after 3 tablets, Disp: , Rfl:  .  Omega-3 Fatty Acids (FISH OIL) 1000 MG CAPS, Take 2 capsules by mouth 2 (two) times daily., Disp: , Rfl:  .  omeprazole (PRILOSEC) 40 MG capsule, Take 1 capsule by mouth 2 (two) times daily., Disp: , Rfl:  .  potassium chloride (K-DUR,KLOR-CON) 10 MEQ tablet, , Disp: , Rfl:  .  pravastatin (PRAVACHOL) 40 MG tablet, Take 40 mg by mouth daily., Disp: , Rfl: 0 .  predniSONE (DELTASONE) 10 MG tablet, Take 10-60 mg by mouth daily. 6 tabs by mouth, day 1, then Decrease by 8m (1 tab) daily until goned, Disp: , Rfl: 0 .  probenecid (BENEMID) 500 MG tablet, Take 1 tablet by mouth 2 (two) times daily., Disp: , Rfl: 0 .  tamsulosin (FLOMAX) 0.4 MG CAPS capsule, Take 1 capsule by mouth daily.,  Disp: , Rfl:  .  valACYclovir (VALTREX) 1000 MG tablet, Take 1,000 mg by mouth 3 (three) times daily., Disp: , Rfl: 0 .  vitamin C (ASCORBIC ACID) 500 MG tablet, Take 1,000 mg by mouth daily., Disp: , Rfl:  .  warfarin (COUMADIN) 3 MG tablet, Take 1 tablet by mouth daily., Disp: , Rfl: 0  Past Medical History: Past Medical History  Diagnosis Date  . Myocardial infarction   . Arthritis   . Diabetes mellitus without complication   . Cancer     Tobacco Use: History  Smoking status  . Never Smoker   Smokeless tobacco  . Never Used    Labs: Recent Review Flowsheet Data    There is no flowsheet data to display.       Exercise Target Goals: Date: 06/05/15  Exercise Program Goal: Individual exercise prescription set with THRR,  safety & activity barriers. Participant demonstrates ability to understand and report RPE using BORG scale, to self-measure pulse accurately, and to acknowledge the importance of the exercise prescription.  Exercise Prescription Goal: Starting with aerobic activity 30 plus minutes a day, 3 days per week for initial exercise prescription. Provide home exercise prescription and guidelines that participant acknowledges understanding prior to discharge.  Activity Barriers & Risk Stratification:     Activity Barriers & Risk Stratification - 06/06/15 1146    Activity Barriers & Risk Stratification   Activity Barriers Arthritis;Joint Problems   Risk Stratification High      6 Minute Walk:     6 Minute Walk      06/05/15 1440       6 Minute Walk   Phase Initial     Distance 512 feet     Walk Time 4 minutes     Resting HR 54 bpm     Resting BP 122/52 mmHg     Max Ex. HR 86 bpm     Max Ex. BP 140/58 mmHg     RPE 11     Symptoms Yes (comment)     Comments SOB        Initial Exercise Prescription:     Initial Exercise Prescription - 06/05/15 1400    Date of Initial Exercise Prescription   Date 06/05/15   Treadmill   MPH 0.8   Grade 0   Minutes 10   Recumbant Bike   Level 1   RPM 40   Watts 20   Minutes 10   NuStep   Level 2   Watts 40   Minutes 10   Arm Ergometer   Level 1   Watts 10   Minutes 10   REL-XR   Level 1   Watts 40   Minutes 10   Prescription Details   Frequency (times per week) 3   Duration Progress to 30 minutes of continuous aerobic without signs/symptoms of physical distress   Intensity   THRR REST +  30   Ratings of Perceived Exertion 11-15   Perceived Dyspnea 2-4   Progression Continue progressive overload as per policy without signs/symptoms or physical distress.   Resistance Training   Training Prescription Yes   Weight 2   Reps 10-15      Exercise Prescription Changes:   Discharge Exercise Prescription (Final Exercise  Prescription Changes):   Nutrition:  Target Goals: Understanding of nutrition guidelines, daily intake of sodium <1557m, cholesterol <2050m calories 30% from fat and 7% or less from saturated fats, daily to have 5 or more servings of fruits  and vegetables.  Biometrics:     Pre Biometrics - 06/05/15 1443    Pre Biometrics   Height 5' 6.5" (1.689 m)   Weight 190 lb 6.4 oz (86.365 kg)   Waist Circumference 43.5 inches   Hip Circumference 47 inches   Waist to Hip Ratio 0.93 %   BMI (Calculated) 30.3       Nutrition Therapy Plan and Nutrition Goals:     Nutrition Therapy & Goals - 06/06/15 1512    Nutrition Therapy   Drug/Food Interactions Statins/Certain Fruits;Coumadin/Vit K   Intervention Plan   Intervention Using nutrition plan and personal goals to gain a healthy nutrition lifestyle. Add exercise as prescribed.      Nutrition Discharge: Rate Your Plate Scores:   Nutrition Goals Re-Evaluation:   Psychosocial: Target Goals: Acknowledge presence or absence of depression, maximize coping skills, provide positive support system. Participant is able to verbalize types and ability to use techniques and skills needed for reducing stress and depression.  Initial Review & Psychosocial Screening:     Initial Psych Review & Screening - 06/06/15 [place]? Yes   Barriers   Psychosocial barriers to participate in program There are no identifiable barriers or psychosocial needs.      Quality of Life Scores:     Quality of Life - 06/06/15 1144    Quality of Life Scores   Health/Function Pre 22.17 %   Socioeconomic Pre 25.21 %   Psych/Spiritual Pre 28.21 %   Family Pre 28 %   GLOBAL Pre 24.9 %      PHQ-9:     Recent Review Flowsheet Data    Depression screen [facility] 2/9 06/06/2015   Decreased Interest 0   Down, Depressed, Hopeless 0   PHQ - 2 Score 0   Altered sleeping 2   Tired, decreased energy 0   Change in appetite 0    Feeling bad or failure about yourself  0   Trouble concentrating 0   Moving slowly or fidgety/restless 0   Suicidal thoughts 0   PHQ-9 Score 2   Difficult doing work/chores Not difficult at all      Psychosocial Evaluation and Intervention:   Psychosocial Re-Evaluation:   Vocational Rehabilitation: Provide vocational rehab assistance to qualifying candidates.   Vocational Rehab Evaluation & Intervention:     Vocational Rehab - 06/06/15 1510    Initial Vocational Rehab Evaluation & Intervention   Assessment shows need for Vocational Rehabilitation No      Education: Education Goals: Education classes will be provided on a weekly basis, covering required topics. Participant will state understanding/return demonstration of topics presented.  Learning Barriers/Preferences:     Learning Barriers/Preferences - 06/06/15 1510    Learning Barriers/Preferences   Learning Barriers None   Learning Preferences Group Instruction      Education Topics: General Nutrition Guidelines/Fats and Fiber: -Group instruction provided by verbal, written material, models and posters to present the general guidelines for heart healthy nutrition. Gives an explanation and review of dietary fats and fiber.   Controlling Sodium/Reading Food Labels: -Group verbal and written material supporting the discussion of sodium use in heart healthy nutrition. Review and explanation with models, verbal and written materials for utilization of the food label.   Exercise Physiology & Risk Factors: - Group verbal and written instruction with models to review the exercise physiology of the cardiovascular system and associated critical values. Details cardiovascular disease risk factors and the goals  associated with each risk factor.   Aerobic Exercise & Resistance Training: - Gives group verbal and written discussion on the health impact of inactivity. On the components of aerobic and resistive training  programs and the benefits of this training and how to safely progress through these programs.   Flexibility, Balance, General Exercise Guidelines: - Provides group verbal and written instruction on the benefits of flexibility and balance training programs. Provides general exercise guidelines with specific guidelines to those with heart or lung disease. Demonstration and skill practice provided.   Stress Management: - Provides group verbal and written instruction about the health risks of elevated stress, cause of high stress, and healthy ways to reduce stress.   Depression: - Provides group verbal and written instruction on the correlation between heart/lung disease and depressed mood, treatment options, and the stigmas associated with seeking treatment.   Anatomy & Physiology of the Heart: - Group verbal and written instruction and models provide basic cardiac anatomy and physiology, with the coronary electrical and arterial systems. Review of: AMI, Angina, Valve disease, Heart Failure, Cardiac Arrhythmia, Pacemakers, and the ICD.   Cardiac Procedures: - Group verbal and written instruction and models to describe the testing methods done to diagnose heart disease. Reviews the outcomes of the test results. Describes the treatment choices: Medical Management, Angioplasty, or Coronary Bypass Surgery.   Cardiac Medications: - Group verbal and written instruction to review commonly prescribed medications for heart disease. Reviews the medication, class of the drug, and side effects. Includes the steps to properly store meds and maintain the prescription regimen.   Go Sex-Intimacy & Heart Disease, Get SMART - Goal Setting: - Group verbal and written instruction through game format to discuss heart disease and the return to sexual intimacy. Provides group verbal and written material to discuss and apply goal setting through the application of the S.M.A.R.T. Method.   Other Matters of the  Heart: - Provides group verbal, written materials and models to describe Heart Failure, Angina, Valve Disease, and Diabetes in the realm of heart disease. Includes description of the disease process and treatment options available to the cardiac patient.   Exercise & Equipment Safety: - Individual verbal instruction and demonstration of equipment use and safety with use of the equipment.          Cardiac Rehab from 06/05/2015 in [facility] Cardiac Rehab   Date  06/06/15   Educator  C. Enterkin,RN      Infection Prevention: - Provides verbal and written material to individual with discussion of infection control including proper hand washing and proper equipment cleaning during exercise session.      Cardiac Rehab from 06/05/2015 in [facility] Cardiac Rehab   Date  06/06/15   Educator  C. EnterkinRN      Falls Prevention: - Provides verbal and written material to individual with discussion of falls prevention and safety.      Cardiac Rehab from 06/05/2015 in [facility] Cardiac Rehab   Date  06/06/15   Educator  C. Enterkin,RN   Instruction Review Code  1- partially meets, needs review/practice      Diabetes: - Individual verbal and written instruction to review signs/symptoms of diabetes, desired ranges of glucose level fasting, after meals and with exercise. Advice that pre and post exercise glucose checks will be done for 3 sessions at entry of program.      Cardiac Rehab from 06/05/2015 in [facility] Cardiac Rehab   Date  06/06/15   Educator  Zena   Instruction Review  Code  1- partially meets, needs review/practice       Knowledge Questionnaire Score:     Knowledge Questionnaire Score - 06/06/15 1504    Knowledge Questionnaire Score   Pre Score 20      Personal Goals and Risk Factors at Admission:     Personal Goals and Risk Factors at Admission - 06/06/15 1145    Personal Goals and Risk Factors on Admission    Weight Management Yes   Intervention Learn and follow the exercise  and diet guidelines while in the program. Utilize the nutrition and education classes to help gain knowledge of the diet and exercise expectations in the program   Increase Aerobic Exercise and Physical Activity Yes   Intervention While in program, learn and follow the exercise prescription taught. Start at a low level workload and increase workload after able to maintain previous level for 30 minutes. Increase time before increasing intensity.   Understand more about Heart/Pulmonary Disease. Yes   Intervention While in program utilize professionals for any questions, and attend the education sessions. Great websites to use are www.americanheart.org or www.lung.org for reliable information.   Increase knowledge of respiratory medications and ability to use respiratory devices properly.  Yes   Diabetes Yes   Goal Blood glucose control identified by blood glucose values, HgbA1C. Participant verbalizes understanding of the signs/symptoms of hyper/hypo glycemia, proper foot care and importance of medication and nutrition plan for blood glucose control.   Intervention Provide nutrition & aerobic exercise along with prescribed medications to achieve blood glucose in normal ranges: Fasting 65-99 mg/dL   Hypertension Yes   Goal Participant will see blood pressure controlled within the values of 140/59m/Hg or within value directed by their physician.   Intervention Provide nutrition & aerobic exercise along with prescribed medications to achieve BP 140/90 or less.      Personal Goals and Risk Factors Review:    Personal Goals Discharge (Final Personal Goals and Risk Factors Review):     Comments:Ready to start Cardiac Rehab except Ger or his wife has MD appt on 9/28, and 9/29 so will start 06/16/2015.

## 2015-06-09 ENCOUNTER — Encounter: Payer: Medicare Other | Attending: Internal Medicine

## 2015-06-09 DIAGNOSIS — Z9889 Other specified postprocedural states: Secondary | ICD-10-CM | POA: Insufficient documentation

## 2015-06-16 ENCOUNTER — Encounter: Payer: Medicare Other | Attending: Internal Medicine | Admitting: *Deleted

## 2015-06-16 DIAGNOSIS — Z794 Long term (current) use of insulin: Secondary | ICD-10-CM | POA: Diagnosis not present

## 2015-06-16 DIAGNOSIS — Z9889 Other specified postprocedural states: Secondary | ICD-10-CM | POA: Insufficient documentation

## 2015-06-16 DIAGNOSIS — Z792 Long term (current) use of antibiotics: Secondary | ICD-10-CM | POA: Insufficient documentation

## 2015-06-16 DIAGNOSIS — Z79899 Other long term (current) drug therapy: Secondary | ICD-10-CM | POA: Diagnosis not present

## 2015-06-16 DIAGNOSIS — Z9861 Coronary angioplasty status: Secondary | ICD-10-CM | POA: Diagnosis not present

## 2015-06-16 LAB — GLUCOSE, CAPILLARY
Glucose-Capillary: 182 mg/dL — ABNORMAL HIGH (ref 65–99)
Glucose-Capillary: 163 mg/dL — ABNORMAL HIGH (ref 65–99)

## 2015-06-18 ENCOUNTER — Encounter: Payer: Medicare Other | Admitting: *Deleted

## 2015-06-18 DIAGNOSIS — Z9861 Coronary angioplasty status: Secondary | ICD-10-CM

## 2015-06-18 LAB — GLUCOSE, CAPILLARY
Glucose-Capillary: 190 mg/dL — ABNORMAL HIGH (ref 65–99)
Glucose-Capillary: 93 mg/dL (ref 65–99)

## 2015-06-18 NOTE — Progress Notes (Signed)
Daily Session Note  Patient Details  Name: Chase Cabrera MRN: [id number] Date of Birth: 05/01/1946 Referring Provider:  Concha Pyo, MD  Encounter Date: 06/18/2015  Check In:     Session Check In - 06/18/15 1808    Check-In   Staff Present Diane Joya Gaskins RN, BSN;Renne Cornick Dillard Essex MS, ACSM CEP Exercise Physiologist;Carroll Enterkin RN, BSN   ER physicians immediately available to respond to emergencies See telemetry face sheet for immediately available ER MD   Medication changes reported     No   Fall or balance concerns reported    No   Warm-up and Cool-down Performed on first and last piece of equipment   VAD Patient? No   Pain Assessment   Currently in Pain? No/denies   Multiple Pain Sites No           Exercise Prescription Changes - 06/18/15 1800    Response to Exercise   Comments Third day of exercise. Patient was oriented to the gym and the equipment functions and settings. Procedures and policies of the gym were outlined and explained. The patient's individual exercise prescription and treatment plan were reviewed with them. All starting workloads were established based on the results of the functional testing  done at the initial intake visit. The plan for exercise progression was also introduced and progression will be customized based on the patient's performance and goals.    Duration Progress to 30 minutes of continuous aerobic without signs/symptoms of physical distress   Intensity Rest + 30   Progression Continue progressive overload as per policy without signs/symptoms or physical distress.   Resistance Training   Training Prescription Yes   Weight 2   Reps 10-15      Goals Met:  Independence with exercise equipment Exercise tolerated well Personal goals reviewed No report of cardiac concerns or symptoms Strength training completed today  Goals Unmet:  Not Applicable  Goals Comments: Did well with all exercise goals.    Dr. Emily Filbert is Medical  Director for [facility].

## 2015-06-18 NOTE — Progress Notes (Signed)
Cardiac Individual Treatment Plan  Patient Details  Name: Chase Cabrera MRN: [id number] Date of Birth: 1946-08-02 Referring Provider:  Concha Pyo, MD  Initial Encounter Date:    Visit Diagnosis: S/P angioplasty with stent  Patient's Home Medications on Admission:  Current outpatient prescriptions:  .  amLODipine (NORVASC) 10 MG tablet, Take 1 tablet by mouth daily., Disp: , Rfl:  .  aspirin EC 81 MG tablet, Take 1 tablet by mouth daily., Disp: , Rfl:  .  calcium carbonate (OS-CAL - DOSED IN MG OF ELEMENTAL CALCIUM) 1250 (500 CA) MG tablet, Take 500 mg by mouth 2 (two) times daily., Disp: , Rfl:  .  carvedilol (COREG) 25 MG tablet, , Disp: , Rfl: 1 .  colchicine 0.6 MG tablet, Take 1 tablet by mouth daily., Disp: , Rfl: 0 .  diclofenac sodium (VOLTAREN) 1 % GEL, Apply 2 g topically 4 (four) times daily., Disp: , Rfl:  .  diltiazem (CARDIZEM) 30 MG tablet, Take 30 mg by mouth daily as needed. For afib, Disp: , Rfl:  .  furosemide (LASIX) 20 MG tablet, Take 1 tablet by mouth daily., Disp: , Rfl: 0 .  gabapentin (NEURONTIN) 300 MG capsule, Take 1 capsule by mouth 3 (three) times daily., Disp: , Rfl:  .  HUMALOG MIX 75/25 KWIKPEN (75-25) 100 UNIT/ML Kwikpen, Inject 20-40 Units into the skin 2 (two) times daily. 20 units subcutaneous before breakfast and 40 units before supper., Disp: , Rfl: 0 .  hydrALAZINE (APRESOLINE) 100 MG tablet, Take 1 tablet by mouth 3 (three) times daily., Disp: , Rfl:  .  hydrochlorothiazide (HYDRODIURIL) 25 MG tablet, Take 1 tablet by mouth daily., Disp: , Rfl:  .  HYDROcodone-acetaminophen (NORCO/VICODIN) 5-325 MG per tablet, Take 1 tablet by mouth every 6 (six) hours as needed. pain, Disp: , Rfl: 0 .  isosorbide mononitrate (IMDUR) 120 MG 24 hr tablet, Take 1 tablet by mouth daily., Disp: , Rfl:  .  JANUVIA 100 MG tablet, Take 1 tablet by mouth daily., Disp: , Rfl:  .  loratadine (CLARITIN) 10 MG tablet, Take 1 tablet by mouth daily before breakfast., Disp: ,  Rfl:  .  LYRICA 75 MG capsule, Take 75 mg by mouth 2 (two) times daily., Disp: , Rfl: 0 .  metFORMIN (GLUCOPHAGE) 1000 MG tablet, Take 1 tablet by mouth 2 (two) times daily., Disp: , Rfl: 0 .  Multiple Vitamin (MULTI-VITAMINS) TABS, Take 1 tablet by mouth daily., Disp: , Rfl:  .  nitroGLYCERIN (NITROSTAT) 0.4 MG SL tablet, Place 1 tablet under the tongue every 5 (five) minutes x 3 doses as needed. For chest pain.  Call 911 if no relief after 3 tablets, Disp: , Rfl:  .  Omega-3 Fatty Acids (FISH OIL) 1000 MG CAPS, Take 2 capsules by mouth 2 (two) times daily., Disp: , Rfl:  .  omeprazole (PRILOSEC) 40 MG capsule, Take 1 capsule by mouth 2 (two) times daily., Disp: , Rfl:  .  potassium chloride (K-DUR,KLOR-CON) 10 MEQ tablet, , Disp: , Rfl:  .  pravastatin (PRAVACHOL) 40 MG tablet, Take 40 mg by mouth daily., Disp: , Rfl: 0 .  predniSONE (DELTASONE) 10 MG tablet, Take 10-60 mg by mouth daily. 6 tabs by mouth, day 1, then Decrease by 15m (1 tab) daily until goned, Disp: , Rfl: 0 .  probenecid (BENEMID) 500 MG tablet, Take 1 tablet by mouth 2 (two) times daily., Disp: , Rfl: 0 .  tamsulosin (FLOMAX) 0.4 MG CAPS capsule, Take 1 capsule by mouth  daily., Disp: , Rfl:  .  valACYclovir (VALTREX) 1000 MG tablet, Take 1,000 mg by mouth 3 (three) times daily., Disp: , Rfl: 0 .  vitamin C (ASCORBIC ACID) 500 MG tablet, Take 1,000 mg by mouth daily., Disp: , Rfl:  .  warfarin (COUMADIN) 3 MG tablet, Take 1 tablet by mouth daily., Disp: , Rfl: 0  Past Medical History: Past Medical History  Diagnosis Date  . Myocardial infarction   . Arthritis   . Diabetes mellitus without complication   . Cancer     Tobacco Use: History  Smoking status  . Never Smoker   Smokeless tobacco  . Never Used    Labs: Recent Review Flowsheet Data    There is no flowsheet data to display.       Exercise Target Goals:    Exercise Program Goal: Individual exercise prescription set with THRR, safety & activity  barriers. Participant demonstrates ability to understand and report RPE using BORG scale, to self-measure pulse accurately, and to acknowledge the importance of the exercise prescription.  Exercise Prescription Goal: Starting with aerobic activity 30 plus minutes a day, 3 days per week for initial exercise prescription. Provide home exercise prescription and guidelines that participant acknowledges understanding prior to discharge.  Activity Barriers & Risk Stratification:     Activity Barriers & Risk Stratification - 06/06/15 1146    Activity Barriers & Risk Stratification   Activity Barriers Arthritis;Joint Problems   Risk Stratification High      6 Minute Walk:     6 Minute Walk      06/05/15 1440       6 Minute Walk   Phase Initial     Distance 512 feet     Walk Time 4 minutes     Resting HR 54 bpm     Resting BP 122/52 mmHg     Max Ex. HR 86 bpm     Max Ex. BP 140/58 mmHg     RPE 11     Symptoms Yes (comment)     Comments SOB        Initial Exercise Prescription:     Initial Exercise Prescription - 06/05/15 1400    Date of Initial Exercise Prescription   Date 06/05/15   Treadmill   MPH 0.8   Grade 0   Minutes 10   Recumbant Bike   Level 1   RPM 40   Watts 20   Minutes 10   NuStep   Level 2   Watts 40   Minutes 10   Arm Ergometer   Level 1   Watts 10   Minutes 10   REL-XR   Level 1   Watts 40   Minutes 10   Prescription Details   Frequency (times per week) 3   Duration Progress to 30 minutes of continuous aerobic without signs/symptoms of physical distress   Intensity   THRR REST +  30   Ratings of Perceived Exertion 11-15   Perceived Dyspnea 2-4   Progression Continue progressive overload as per policy without signs/symptoms or physical distress.   Resistance Training   Training Prescription Yes   Weight 2   Reps 10-15      Exercise Prescription Changes:   Discharge Exercise Prescription (Final Exercise Prescription  Changes):   Nutrition:  Target Goals: Understanding of nutrition guidelines, daily intake of sodium <1551m, cholesterol <2060m calories 30% from fat and 7% or less from saturated fats, daily to have 5 or more servings of  fruits and vegetables.  Biometrics:     Pre Biometrics - 06/05/15 1443    Pre Biometrics   Height 5' 6.5" (1.689 m)   Weight 190 lb 6.4 oz (86.365 kg)   Waist Circumference 43.5 inches   Hip Circumference 47 inches   Waist to Hip Ratio 0.93 %   BMI (Calculated) 30.3       Nutrition Therapy Plan and Nutrition Goals:     Nutrition Therapy & Goals - 06/06/15 1512    Nutrition Therapy   Drug/Food Interactions Statins/Certain Fruits;Coumadin/Vit K   Intervention Plan   Intervention Using nutrition plan and personal goals to gain a healthy nutrition lifestyle. Add exercise as prescribed.      Nutrition Discharge: Rate Your Plate Scores:   Nutrition Goals Re-Evaluation:     Nutrition Goals Re-Evaluation      06/18/15 1359           Personal Goal #1 Re-Evaluation   Personal Goal #1 WAs in Cardiac Rehab 3 years ago cont to try to eat healthy.        Goal Progress Seen Yes          Psychosocial: Target Goals: Acknowledge presence or absence of depression, maximize coping skills, provide positive support system. Participant is able to verbalize types and ability to use techniques and skills needed for reducing stress and depression.  Initial Review & Psychosocial Screening:     Initial Psych Review & Screening - 06/06/15 [place]? Yes   Barriers   Psychosocial barriers to participate in program There are no identifiable barriers or psychosocial needs.      Quality of Life Scores:     Quality of Life - 06/06/15 1144    Quality of Life Scores   Health/Function Pre 22.17 %   Socioeconomic Pre 25.21 %   Psych/Spiritual Pre 28.21 %   Family Pre 28 %   GLOBAL Pre 24.9 %      PHQ-9:     Recent Review  Flowsheet Data    Depression screen [facility] 2/9 06/06/2015   Decreased Interest 0   Down, Depressed, Hopeless 0   PHQ - 2 Score 0   Altered sleeping 2   Tired, decreased energy 0   Change in appetite 0   Feeling bad or failure about yourself  0   Trouble concentrating 0   Moving slowly or fidgety/restless 0   Suicidal thoughts 0   PHQ-9 Score 2   Difficult doing work/chores Not difficult at all      Psychosocial Evaluation and Intervention:   Psychosocial Re-Evaluation:     Psychosocial Re-Evaluation      06/18/15 1400           Psychosocial Re-Evaluation   Interventions Encouraged to attend Cardiac Rehabilitation for the exercise       Comments Angell said he knew he needed to exerise more and things just kept coming up.           Vocational Rehabilitation: Provide vocational rehab assistance to qualifying candidates.   Vocational Rehab Evaluation & Intervention:     Vocational Rehab - 06/06/15 1510    Initial Vocational Rehab Evaluation & Intervention   Assessment shows need for Vocational Rehabilitation No      Education: Education Goals: Education classes will be provided on a weekly basis, covering required topics. Participant will state understanding/return demonstration of topics presented.  Learning Barriers/Preferences:     Learning Barriers/Preferences -  06/06/15 1510    Learning Barriers/Preferences   Learning Barriers None   Learning Preferences Group Instruction      Education Topics: General Nutrition Guidelines/Fats and Fiber: -Group instruction provided by verbal, written material, models and posters to present the general guidelines for heart healthy nutrition. Gives an explanation and review of dietary fats and fiber.   Controlling Sodium/Reading Food Labels: -Group verbal and written material supporting the discussion of sodium use in heart healthy nutrition. Review and explanation with models, verbal and written materials for  utilization of the food label.   Exercise Physiology & Risk Factors: - Group verbal and written instruction with models to review the exercise physiology of the cardiovascular system and associated critical values. Details cardiovascular disease risk factors and the goals associated with each risk factor.   Aerobic Exercise & Resistance Training: - Gives group verbal and written discussion on the health impact of inactivity. On the components of aerobic and resistive training programs and the benefits of this training and how to safely progress through these programs.   Flexibility, Balance, General Exercise Guidelines: - Provides group verbal and written instruction on the benefits of flexibility and balance training programs. Provides general exercise guidelines with specific guidelines to those with heart or lung disease. Demonstration and skill practice provided.   Stress Management: - Provides group verbal and written instruction about the health risks of elevated stress, cause of high stress, and healthy ways to reduce stress.   Depression: - Provides group verbal and written instruction on the correlation between heart/lung disease and depressed mood, treatment options, and the stigmas associated with seeking treatment.   Anatomy & Physiology of the Heart: - Group verbal and written instruction and models provide basic cardiac anatomy and physiology, with the coronary electrical and arterial systems. Review of: AMI, Angina, Valve disease, Heart Failure, Cardiac Arrhythmia, Pacemakers, and the ICD.   Cardiac Procedures: - Group verbal and written instruction and models to describe the testing methods done to diagnose heart disease. Reviews the outcomes of the test results. Describes the treatment choices: Medical Management, Angioplasty, or Coronary Bypass Surgery.   Cardiac Medications: - Group verbal and written instruction to review commonly prescribed medications for heart  disease. Reviews the medication, class of the drug, and side effects. Includes the steps to properly store meds and maintain the prescription regimen.   Go Sex-Intimacy & Heart Disease, Get SMART - Goal Setting: - Group verbal and written instruction through game format to discuss heart disease and the return to sexual intimacy. Provides group verbal and written material to discuss and apply goal setting through the application of the S.M.A.R.T. Method.   Other Matters of the Heart: - Provides group verbal, written materials and models to describe Heart Failure, Angina, Valve Disease, and Diabetes in the realm of heart disease. Includes description of the disease process and treatment options available to the cardiac patient.   Exercise & Equipment Safety: - Individual verbal instruction and demonstration of equipment use and safety with use of the equipment.          Cardiac Rehab from 06/05/2015 in [facility] Cardiac Rehab   Date  06/06/15   Educator  C. Travares Nelles,RN      Infection Prevention: - Provides verbal and written material to individual with discussion of infection control including proper hand washing and proper equipment cleaning during exercise session.      Cardiac Rehab from 06/05/2015 in [facility] Cardiac Rehab   Date  06/06/15   Educator  C.  EnterkinRN      Falls Prevention: - Provides verbal and written material to individual with discussion of falls prevention and safety.      Cardiac Rehab from 06/05/2015 in [facility] Cardiac Rehab   Date  06/06/15   Educator  C. Magnum Lunde,RN   Instruction Review Code  1- partially meets, needs review/practice      Diabetes: - Individual verbal and written instruction to review signs/symptoms of diabetes, desired ranges of glucose level fasting, after meals and with exercise. Advice that pre and post exercise glucose checks will be done for 3 sessions at entry of program.      Cardiac Rehab from 06/05/2015 in [facility] Cardiac Rehab   Date   06/06/15   Educator  Victoria   Instruction Review Code  1- partially meets, needs review/practice       Knowledge Questionnaire Score:     Knowledge Questionnaire Score - 06/06/15 1504    Knowledge Questionnaire Score   Pre Score 20      Personal Goals and Risk Factors at Admission:     Personal Goals and Risk Factors at Admission - 06/06/15 1145    Personal Goals and Risk Factors on Admission    Weight Management Yes   Intervention Learn and follow the exercise and diet guidelines while in the program. Utilize the nutrition and education classes to help gain knowledge of the diet and exercise expectations in the program   Increase Aerobic Exercise and Physical Activity Yes   Intervention While in program, learn and follow the exercise prescription taught. Start at a low level workload and increase workload after able to maintain previous level for 30 minutes. Increase time before increasing intensity.   Understand more about Heart/Pulmonary Disease. Yes   Intervention While in program utilize professionals for any questions, and attend the education sessions. Great websites to use are www.americanheart.org or www.lung.org for reliable information.   Increase knowledge of respiratory medications and ability to use respiratory devices properly.  Yes   Diabetes Yes   Goal Blood glucose control identified by blood glucose values, HgbA1C. Participant verbalizes understanding of the signs/symptoms of hyper/hypo glycemia, proper foot care and importance of medication and nutrition plan for blood glucose control.   Intervention Provide nutrition & aerobic exercise along with prescribed medications to achieve blood glucose in normal ranges: Fasting 65-99 mg/dL   Hypertension Yes   Goal Participant will see blood pressure controlled within the values of 140/75m/Hg or within value directed by their physician.   Intervention Provide nutrition & aerobic exercise along with prescribed  medications to achieve BP 140/90 or less.      Personal Goals and Risk Factors Review:    Personal Goals Discharge (Final Personal Goals and Risk Factors Review):     Comments: RWanelikes the new [employer] got since he said it is a smoother ride and easier on his legs.

## 2015-06-19 DIAGNOSIS — Z9861 Coronary angioplasty status: Secondary | ICD-10-CM

## 2015-06-19 LAB — GLUCOSE, CAPILLARY
Glucose-Capillary: 176 mg/dL — ABNORMAL HIGH (ref 65–99)
Glucose-Capillary: 169 mg/dL — ABNORMAL HIGH (ref 65–99)

## 2015-06-19 NOTE — Progress Notes (Signed)
Daily Session Note  Patient Details  Name: Chase Cabrera MRN: [id number] Date of Birth: 11/16/1945 Referring Provider:  Concha Pyo, MD  Encounter Date: 06/19/2015  Check In:     Session Check In - 06/19/15 1623    Check-In   Staff Present [name] RN;Diane Joya Gaskins RN, BSN;Jonte Shiller BS, ACSM EP-C, Exercise Physiologist   ER physicians immediately available to respond to emergencies See telemetry face sheet for immediately available ER MD   Medication changes reported     No   Fall or balance concerns reported    No   Warm-up and Cool-down Performed on first and last piece of equipment   VAD Patient? No   Pain Assessment   Currently in Pain? No/denies           Exercise Prescription Changes - 06/18/15 1800    Response to Exercise   Comments Third day of exercise. Patient was oriented to the gym and the equipment functions and settings. Procedures and policies of the gym were outlined and explained. The patient's individual exercise prescription and treatment plan were reviewed with them. All starting workloads were established based on the results of the functional testing  done at the initial intake visit. The plan for exercise progression was also introduced and progression will be customized based on the patient's performance and goals.    Duration Progress to 30 minutes of continuous aerobic without signs/symptoms of physical distress   Intensity Rest + 30   Progression Continue progressive overload as per policy without signs/symptoms or physical distress.   Resistance Training   Training Prescription Yes   Weight 2   Reps 10-15      Goals Met:  Proper associated with RPD/PD & O2 Sat Exercise tolerated well No report of cardiac concerns or symptoms Strength training completed today  Goals Unmet:  Not Applicable  Goals Comments:    Dr. Emily Filbert is Medical Director for [facility].

## 2015-06-23 ENCOUNTER — Encounter: Payer: Self-pay | Admitting: *Deleted

## 2015-06-23 DIAGNOSIS — Z9861 Coronary angioplasty status: Secondary | ICD-10-CM

## 2015-06-23 LAB — GLUCOSE, CAPILLARY
Glucose-Capillary: 131 mg/dL — ABNORMAL HIGH (ref 65–99)
Glucose-Capillary: 140 mg/dL — ABNORMAL HIGH (ref 65–99)

## 2015-06-23 NOTE — Progress Notes (Signed)
Daily Session Note  Patient Details  Name: Chase Cabrera MRN: [id number] Date of Birth: Mar 22, 1946 Referring Provider:  Concha Pyo, MD  Encounter Date: 06/23/2015  Check In:     Session Check In - 06/23/15 1740    Check-In   Staff Present Gerlene Burdock RN, BSN;Steven Way BS, ACSM EP-C, Exercise Physiologist;Other   ER physicians immediately available to respond to emergencies See telemetry face sheet for immediately available ER MD   Medication changes reported     No   Fall or balance concerns reported    No   Warm-up and Cool-down Performed on first and last piece of equipment   VAD Patient? No   Pain Assessment   Currently in Pain? No/denies           Exercise Prescription Changes - 06/23/15 0700    Exercise Review   Progression Yes   Response to Exercise   Blood Pressure (Admit) 122/60 mmHg   Blood Pressure (Exercise) 132/78 mmHg   Blood Pressure (Exit) 116/62 mmHg   Heart Rate (Admit) 62 bpm   Heart Rate (Exercise) 63 bpm   Heart Rate (Exit) 59 bpm   Rating of Perceived Exertion (Exercise) 12   Symptoms None   Comments Reviewed individualized exercise prescription and made increases per departmental policy. Exercise increases were discussed with the patient and they were able to perform the new work loads without issue (no signs or symptoms).    Duration Progress to 30 minutes of continuous aerobic without signs/symptoms of physical distress   Intensity Rest + 30   Progression Continue progressive overload as per policy without signs/symptoms or physical distress.   Resistance Training   Training Prescription Yes   Weight 2   Reps 10-15   Interval Training   Interval Training No   NuStep   Level 2   Watts 30   Minutes 20   Recumbant Elliptical   Level 4  BioStep   Watts 30   Minutes 25      Goals Met:  Proper associated with RPD/PD & O2 Sat Exercise tolerated well No report of cardiac concerns or symptoms Strength training completed  today  Goals Unmet:  Not Applicable  Goals Comments:   Dr. Emily Filbert is Medical Director for [facility] and [facility].

## 2015-06-25 NOTE — Addendum Note (Signed)
Addended by: Gerlene Burdock on: 06/25/2015 06:21 PM   Modules accepted: Orders

## 2015-06-25 NOTE — Progress Notes (Signed)
Cardiac Individual Treatment Plan  Patient Details  Name: Chase Cabrera MRN: [id number] Date of Birth: 1946/03/02 Referring Provider:  Concha Pyo, MD  Initial Encounter Date:    Visit Diagnosis: S/P angioplasty with stent  Patient's Home Medications on Admission:  Current outpatient prescriptions:  .  amLODipine (NORVASC) 10 MG tablet, Take 1 tablet by mouth daily., Disp: , Rfl:  .  aspirin EC 81 MG tablet, Take 1 tablet by mouth daily., Disp: , Rfl:  .  calcium carbonate (OS-CAL - DOSED IN MG OF ELEMENTAL CALCIUM) 1250 (500 CA) MG tablet, Take 500 mg by mouth 2 (two) times daily., Disp: , Rfl:  .  carvedilol (COREG) 25 MG tablet, , Disp: , Rfl: 1 .  colchicine 0.6 MG tablet, Take 1 tablet by mouth daily., Disp: , Rfl: 0 .  diclofenac sodium (VOLTAREN) 1 % GEL, Apply 2 g topically 4 (four) times daily., Disp: , Rfl:  .  diltiazem (CARDIZEM) 30 MG tablet, Take 30 mg by mouth daily as needed. For afib, Disp: , Rfl:  .  furosemide (LASIX) 20 MG tablet, Take 1 tablet by mouth daily., Disp: , Rfl: 0 .  gabapentin (NEURONTIN) 300 MG capsule, Take 1 capsule by mouth 3 (three) times daily., Disp: , Rfl:  .  HUMALOG MIX 75/25 KWIKPEN (75-25) 100 UNIT/ML Kwikpen, Inject 20-40 Units into the skin 2 (two) times daily. 20 units subcutaneous before breakfast and 40 units before supper., Disp: , Rfl: 0 .  hydrALAZINE (APRESOLINE) 100 MG tablet, Take 1 tablet by mouth 3 (three) times daily., Disp: , Rfl:  .  hydrochlorothiazide (HYDRODIURIL) 25 MG tablet, Take 1 tablet by mouth daily., Disp: , Rfl:  .  HYDROcodone-acetaminophen (NORCO/VICODIN) 5-325 MG per tablet, Take 1 tablet by mouth every 6 (six) hours as needed. pain, Disp: , Rfl: 0 .  isosorbide mononitrate (IMDUR) 120 MG 24 hr tablet, Take 1 tablet by mouth daily., Disp: , Rfl:  .  JANUVIA 100 MG tablet, Take 1 tablet by mouth daily., Disp: , Rfl:  .  loratadine (CLARITIN) 10 MG tablet, Take 1 tablet by mouth daily before breakfast., Disp: ,  Rfl:  .  LYRICA 75 MG capsule, Take 75 mg by mouth 2 (two) times daily., Disp: , Rfl: 0 .  metFORMIN (GLUCOPHAGE) 1000 MG tablet, Take 1 tablet by mouth 2 (two) times daily., Disp: , Rfl: 0 .  Multiple Vitamin (MULTI-VITAMINS) TABS, Take 1 tablet by mouth daily., Disp: , Rfl:  .  nitroGLYCERIN (NITROSTAT) 0.4 MG SL tablet, Place 1 tablet under the tongue every 5 (five) minutes x 3 doses as needed. For chest pain.  Call 911 if no relief after 3 tablets, Disp: , Rfl:  .  Omega-3 Fatty Acids (FISH OIL) 1000 MG CAPS, Take 2 capsules by mouth 2 (two) times daily., Disp: , Rfl:  .  omeprazole (PRILOSEC) 40 MG capsule, Take 1 capsule by mouth 2 (two) times daily., Disp: , Rfl:  .  potassium chloride (K-DUR,KLOR-CON) 10 MEQ tablet, , Disp: , Rfl:  .  pravastatin (PRAVACHOL) 40 MG tablet, Take 40 mg by mouth daily., Disp: , Rfl: 0 .  predniSONE (DELTASONE) 10 MG tablet, Take 10-60 mg by mouth daily. 6 tabs by mouth, day 1, then Decrease by 54m (1 tab) daily until goned, Disp: , Rfl: 0 .  probenecid (BENEMID) 500 MG tablet, Take 1 tablet by mouth 2 (two) times daily., Disp: , Rfl: 0 .  tamsulosin (FLOMAX) 0.4 MG CAPS capsule, Take 1 capsule by mouth  daily., Disp: , Rfl:  .  valACYclovir (VALTREX) 1000 MG tablet, Take 1,000 mg by mouth 3 (three) times daily., Disp: , Rfl: 0 .  vitamin C (ASCORBIC ACID) 500 MG tablet, Take 1,000 mg by mouth daily., Disp: , Rfl:  .  warfarin (COUMADIN) 3 MG tablet, Take 1 tablet by mouth daily., Disp: , Rfl: 0  Past Medical History: Past Medical History  Diagnosis Date  . Myocardial infarction   . Arthritis   . Diabetes mellitus without complication   . Cancer     Tobacco Use: History  Smoking status  . Never Smoker   Smokeless tobacco  . Never Used    Labs: Recent Review Flowsheet Data    There is no flowsheet data to display.       Exercise Target Goals:    Exercise Program Goal: Individual exercise prescription set with THRR, safety & activity  barriers. Participant demonstrates ability to understand and report RPE using BORG scale, to self-measure pulse accurately, and to acknowledge the importance of the exercise prescription.  Exercise Prescription Goal: Starting with aerobic activity 30 plus minutes a day, 3 days per week for initial exercise prescription. Provide home exercise prescription and guidelines that participant acknowledges understanding prior to discharge.  Activity Barriers & Risk Stratification:     Activity Barriers & Risk Stratification - 06/06/15 1146    Activity Barriers & Risk Stratification   Activity Barriers Arthritis;Joint Problems   Risk Stratification High      6 Minute Walk:     6 Minute Walk      06/05/15 1440       6 Minute Walk   Phase Initial     Distance 512 feet     Walk Time 4 minutes     Resting HR 54 bpm     Resting BP 122/52 mmHg     Max Ex. HR 86 bpm     Max Ex. BP 140/58 mmHg     RPE 11     Symptoms Yes (comment)     Comments SOB        Initial Exercise Prescription:     Initial Exercise Prescription - 06/05/15 1400    Date of Initial Exercise Prescription   Date 06/05/15   Treadmill   MPH 0.8   Grade 0   Minutes 10   Recumbant Bike   Level 1   RPM 40   Watts 20   Minutes 10   NuStep   Level 2   Watts 40   Minutes 10   Arm Ergometer   Level 1   Watts 10   Minutes 10   REL-XR   Level 1   Watts 40   Minutes 10   Prescription Details   Frequency (times per week) 3   Duration Progress to 30 minutes of continuous aerobic without signs/symptoms of physical distress   Intensity   THRR REST +  30   Ratings of Perceived Exertion 11-15   Perceived Dyspnea 2-4   Progression Continue progressive overload as per policy without signs/symptoms or physical distress.   Resistance Training   Training Prescription Yes   Weight 2   Reps 10-15      Exercise Prescription Changes:     Exercise Prescription Changes      06/18/15 1800 06/23/15 0700          Exercise Review   Progression  Yes      Response to Exercise   Blood Pressure (Admit)  122/60 mmHg      Blood Pressure (Exercise)  132/78 mmHg      Blood Pressure (Exit)  116/62 mmHg      Heart Rate (Admit)  62 bpm      Heart Rate (Exercise)  63 bpm      Heart Rate (Exit)  59 bpm      Rating of Perceived Exertion (Exercise)  12      Symptoms  None      Comments Third day of exercise. Patient was oriented to the gym and the equipment functions and settings. Procedures and policies of the gym were outlined and explained. The patient's individual exercise prescription and treatment plan were reviewed with them. All starting workloads were established based on the results of the functional testing  done at the initial intake visit. The plan for exercise progression was also introduced and progression will be customized based on the patient's performance and goals.  Reviewed individualized exercise prescription and made increases per departmental policy. Exercise increases were discussed with the patient and they were able to perform the new work loads without issue (no signs or symptoms).       Duration Progress to 30 minutes of continuous aerobic without signs/symptoms of physical distress Progress to 30 minutes of continuous aerobic without signs/symptoms of physical distress      Intensity Rest + 30 Rest + 30      Progression Continue progressive overload as per policy without signs/symptoms or physical distress. Continue progressive overload as per policy without signs/symptoms or physical distress.      Resistance Training   Training Prescription Yes Yes      Weight 2 2      Reps 10-15 10-15      Interval Training   Interval Training  No      NuStep   Level  2      Watts  30      Minutes  20      Recumbant Elliptical   Level  4  BioStep      Watts  30      Minutes  25         Discharge Exercise Prescription (Final Exercise Prescription Changes):     Exercise Prescription  Changes - 06/23/15 0700    Exercise Review   Progression Yes   Response to Exercise   Blood Pressure (Admit) 122/60 mmHg   Blood Pressure (Exercise) 132/78 mmHg   Blood Pressure (Exit) 116/62 mmHg   Heart Rate (Admit) 62 bpm   Heart Rate (Exercise) 63 bpm   Heart Rate (Exit) 59 bpm   Rating of Perceived Exertion (Exercise) 12   Symptoms None   Comments Reviewed individualized exercise prescription and made increases per departmental policy. Exercise increases were discussed with the patient and they were able to perform the new work loads without issue (no signs or symptoms).    Duration Progress to 30 minutes of continuous aerobic without signs/symptoms of physical distress   Intensity Rest + 30   Progression Continue progressive overload as per policy without signs/symptoms or physical distress.   Resistance Training   Training Prescription Yes   Weight 2   Reps 10-15   Interval Training   Interval Training No   NuStep   Level 2   Watts 30   Minutes 20   Recumbant Elliptical   Level 4  BioStep   Watts 30   Minutes 25      Nutrition:  Target Goals: Understanding  of nutrition guidelines, daily intake of sodium <1562m, cholesterol <2083m calories 30% from fat and 7% or less from saturated fats, daily to have 5 or more servings of fruits and vegetables.  Biometrics:     Pre Biometrics - 06/05/15 1443    Pre Biometrics   Height 5' 6.5" (1.689 m)   Weight 190 lb 6.4 oz (86.365 kg)   Waist Circumference 43.5 inches   Hip Circumference 47 inches   Waist to Hip Ratio 0.93 %   BMI (Calculated) 30.3       Nutrition Therapy Plan and Nutrition Goals:     Nutrition Therapy & Goals - 06/06/15 1512    Nutrition Therapy   Drug/Food Interactions Statins/Certain Fruits;Coumadin/Vit K   Intervention Plan   Intervention Using nutrition plan and personal goals to gain a healthy nutrition lifestyle. Add exercise as prescribed.      Nutrition Discharge: Rate Your Plate  Scores:   Nutrition Goals Re-Evaluation:     Nutrition Goals Re-Evaluation      06/18/15 1359           Personal Goal #1 Re-Evaluation   Personal Goal #1 WAs in Cardiac Rehab 3 years ago cont to try to eat healthy.        Goal Progress Seen Yes          Psychosocial: Target Goals: Acknowledge presence or absence of depression, maximize coping skills, provide positive support system. Participant is able to verbalize types and ability to use techniques and skills needed for reducing stress and depression.  Initial Review & Psychosocial Screening:     Initial Psych Review & Screening - 06/06/15 15ProspectYes   Barriers   Psychosocial barriers to participate in program There are no identifiable barriers or psychosocial needs.      Quality of Life Scores:     Quality of Life - 06/06/15 1144    Quality of Life Scores   Health/Function Pre 22.17 %   Socioeconomic Pre 25.21 %   Psych/Spiritual Pre 28.21 %   Family Pre 28 %   GLOBAL Pre 24.9 %      PHQ-9:     Recent Review Flowsheet Data    [facility]   Decreased Interest 0   Down, Depressed, Hopeless 0   PHQ - 2 Score 0   Altered sleeping 2   Tired, decreased energy 0   Change in appetite 0   Feeling bad or failure about yourself  0   Trouble concentrating 0   Moving slowly or fidgety/restless 0   Suicidal thoughts 0   PHQ-9 Score 2   Difficult doing work/chores Not difficult at all      Psychosocial Evaluation and Intervention:   Psychosocial Re-Evaluation:     Psychosocial Re-Evaluation      06/18/15 1400           Psychosocial Re-Evaluation   Interventions Encouraged to attend Cardiac Rehabilitation for the exercise       Comments RiGibranaid he knew he needed to exerise more and things just kept coming up.           Vocational Rehabilitation: Provide vocational rehab assistance to qualifying candidates.   Vocational Rehab  Evaluation & Intervention:     Vocational Rehab - 06/06/15 1510    Initial Vocational Rehab Evaluation & Intervention   Assessment shows need for Vocational Rehabilitation No      Education: Education Goals:  Education classes will be provided on a weekly basis, covering required topics. Participant will state understanding/return demonstration of topics presented.  Learning Barriers/Preferences:     Learning Barriers/Preferences - 06/06/15 1510    Learning Barriers/Preferences   Learning Barriers None   Learning Preferences Group Instruction      Education Topics: General Nutrition Guidelines/Fats and Fiber: -Group instruction provided by verbal, written material, models and posters to present the general guidelines for heart healthy nutrition. Gives an explanation and review of dietary fats and fiber.          Cardiac Rehab from 06/23/2015 in [facility]   Date  06/23/15   Educator  PI   Instruction Review Code  2- meets goals/outcomes      Controlling Sodium/Reading Food Labels: -Group verbal and written material supporting the discussion of sodium use in heart healthy nutrition. Review and explanation with models, verbal and written materials for utilization of the food label.   Exercise Physiology & Risk Factors: - Group verbal and written instruction with models to review the exercise physiology of the cardiovascular system and associated critical values. Details cardiovascular disease risk factors and the goals associated with each risk factor.   Aerobic Exercise & Resistance Training: - Gives group verbal and written discussion on the health impact of inactivity. On the components of aerobic and resistive training programs and the benefits of this training and how to safely progress through these programs.   Flexibility, Balance, General Exercise Guidelines: - Provides group verbal and written instruction on the benefits of flexibility and balance training  programs. Provides general exercise guidelines with specific guidelines to those with heart or lung disease. Demonstration and skill practice provided.   Stress Management: - Provides group verbal and written instruction about the health risks of elevated stress, cause of high stress, and healthy ways to reduce stress.   Depression: - Provides group verbal and written instruction on the correlation between heart/lung disease and depressed mood, treatment options, and the stigmas associated with seeking treatment.   Anatomy & Physiology of the Heart: - Group verbal and written instruction and models provide basic cardiac anatomy and physiology, with the coronary electrical and arterial systems. Review of: AMI, Angina, Valve disease, Heart Failure, Cardiac Arrhythmia, Pacemakers, and the ICD.   Cardiac Procedures: - Group verbal and written instruction and models to describe the testing methods done to diagnose heart disease. Reviews the outcomes of the test results. Describes the treatment choices: Medical Management, Angioplasty, or Coronary Bypass Surgery.   Cardiac Medications: - Group verbal and written instruction to review commonly prescribed medications for heart disease. Reviews the medication, class of the drug, and side effects. Includes the steps to properly store meds and maintain the prescription regimen.   Go Sex-Intimacy & Heart Disease, Get SMART - Goal Setting: - Group verbal and written instruction through game format to discuss heart disease and the return to sexual intimacy. Provides group verbal and written material to discuss and apply goal setting through the application of the S.M.A.R.T. Method.   Other Matters of the Heart: - Provides group verbal, written materials and models to describe Heart Failure, Angina, Valve Disease, and Diabetes in the realm of heart disease. Includes description of the disease process and treatment options available to the cardiac  patient.      Cardiac Rehab from 06/23/2015 in [facility]   Date  06/18/15   Educator  [place]   Instruction Review Code  2- meets goals/outcomes  Exercise & Equipment Safety: - Individual verbal instruction and demonstration of equipment use and safety with use of the equipment.      Cardiac Rehab from 06/23/2015 in [facility] Card[facility] Rehab   Date  06/06/15   Educator  C. Orlen Leedy,RN      Infection Prevention: - Provides verbal and written material to individual with discussion of infection control including proper hand washing and proper equipment cleaning during exercise session.      Cardiac Rehab from 06/23/2015 in [facility] Cardiac Rehab   Date  06/06/15   Educator  C. EnterkinRN      Falls Prevention: - Provides verbal and written material to individual with discussion of falls prevention and safety.      Cardiac Rehab from 06/23/2015 in [facility] Cardiac Rehab   Date  06/06/15   Educator  C. Johanna Stafford,RN   Instruction Review Code  1- partially meets, needs review/practice      Diabetes: - Individual verbal and written instruction to review signs/symptoms of diabetes, desired ranges of glucose level fasting, after meals and with exercise. Advice that pre and post exercise glucose checks will be done for 3 sessions at entry of program.      Cardiac Rehab from 06/23/2015 in [facility] Cardiac Rehab   Date  06/06/15   Educator  [place]   Instruction Review Code  1- partially meets, needs review/practice       Knowledge Questionnaire Score:     Knowledge Questionnaire Score - 06/06/15 1504    Knowledge Questionnaire Score   Pre Score 20      Personal Goals and Risk Factors at Admission:     Personal Goals and Risk Factors at Admission - 06/06/15 1145    Personal Goals and Risk Factors on Admission    Weight Management Yes   Intervention Learn and follow the exercise and diet guidelines while in the program. Utilize the nutrition and education classes to help gain  knowledge of the diet and exercise expectations in the program   Increase Aerobic Exercise and Physical Activity Yes   Intervention While in program, learn and follow the exercise prescription taught. Start at a low level workload and increase workload after able to maintain previous level for 30 minutes. Increase time before increasing intensity.   Understand more about Heart/Pulmonary Disease. Yes   Intervention While in program utilize professionals for any questions, and attend the education sessions. Great websites to use are www.americanheart.org or www.lung.org for reliable information.   Increase knowledge of respiratory medications and ability to use respiratory devices properly.  Yes   Diabetes Yes   Goal Blood glucose control identified by blood glucose values, HgbA1C. Participant verbalizes understanding of the signs/symptoms of hyper/hypo glycemia, proper foot care and importance of medication and nutrition plan for blood glucose control.   Intervention Provide nutrition & aerobic exercise along with prescribed medications to achieve blood glucose in normal ranges: Fasting 65-99 mg/dL   Hypertension Yes   Goal Participant will see blood pressure controlled within the values of 140/42m/Hg or within value directed by their physician.   Intervention Provide nutrition & aerobic exercise along with prescribed medications to achieve BP 140/90 or less.      Personal Goals and Risk Factors Review:      Goals and Risk Factor Review      06/18/15 1402           Increase Aerobic Exercise and Physical Activity   Goals Progress/Improvement seen  Yes  Comments Hubbert likes the [employer] Exercise piece of equipment and said it is easier for him to exercise on.           Personal Goals Discharge (Final Personal Goals and Risk Factors Review):      Goals and Risk Factor Review - 06/18/15 1402    Increase Aerobic Exercise and Physical Activity   Goals Progress/Improvement seen   Yes   Comments Jiovany likes the new [employer] Exercise piece of equipment and said it is easier for him to exercise on.        Comments: Exercising well.

## 2015-07-02 ENCOUNTER — Encounter: Payer: Medicare Other | Admitting: *Deleted

## 2015-07-02 DIAGNOSIS — Z9861 Coronary angioplasty status: Secondary | ICD-10-CM

## 2015-07-02 LAB — GLUCOSE, CAPILLARY
GLUCOSE-CAPILLARY: 202 mg/dL — AB (ref 65–99)
GLUCOSE-CAPILLARY: 136 mg/dL — AB (ref 65–99)

## 2015-07-02 NOTE — Progress Notes (Signed)
Daily Session Note  Patient Details  Name: Exzavier Ruderman MRN: [id number] Date of Birth: [birth year] Referring Provider:  Concha Pyo, MD  Encounter Date: 07/02/2015  Check In:     Session Check In - 07/02/15 1736    Check-In   Staff Present Candiss Norse MS, ACSM CEP Exercise Physiologist;Diane Joya Gaskins RN, BSN;Carroll Enterkin RN, BSN   ER physicians immediately available to respond to emergencies See telemetry face sheet for immediately available ER MD   Medication changes reported     No   Fall or balance concerns reported    No   Warm-up and Cool-down Performed on first and last piece of equipment   VAD Patient? No   Pain Assessment   Currently in Pain? No/denies   Multiple Pain Sites No         Goals Met:  Independence with exercise equipment Exercise tolerated well No report of cardiac concerns or symptoms Strength training completed today  Goals Unmet:  Not Applicable  Goals Comments: Patient completed exercise prescription and all exercise goals during rehab session. The exercise was tolerated well and the patient is progressing in the program.    Dr. Emily Filbert is Medical Director for [facility].

## 2015-07-03 ENCOUNTER — Emergency Department
Admission: EM | Admit: 2015-07-03 | Discharge: 2015-07-03 | Disposition: A | Discharge status: Home / Self Care | Payer: Medicare Other | Attending: Emergency Medicine | Admitting: Emergency Medicine

## 2015-07-03 ENCOUNTER — Emergency Department: Payer: Medicare Other

## 2015-07-03 ENCOUNTER — Encounter: Payer: Self-pay | Admitting: Emergency Medicine

## 2015-07-03 ENCOUNTER — Other Ambulatory Visit: Payer: Self-pay

## 2015-07-03 ENCOUNTER — Encounter: Payer: Medicare Other | Admitting: *Deleted

## 2015-07-03 DIAGNOSIS — Z7982 Long term (current) use of aspirin: Secondary | ICD-10-CM | POA: Insufficient documentation

## 2015-07-03 DIAGNOSIS — I10 Essential (primary) hypertension: Secondary | ICD-10-CM | POA: Insufficient documentation

## 2015-07-03 DIAGNOSIS — Z7901 Long term (current) use of anticoagulants: Secondary | ICD-10-CM | POA: Insufficient documentation

## 2015-07-03 DIAGNOSIS — E119 Type 2 diabetes mellitus without complications: Secondary | ICD-10-CM | POA: Diagnosis not present

## 2015-07-03 DIAGNOSIS — I493 Ventricular premature depolarization: Secondary | ICD-10-CM | POA: Insufficient documentation

## 2015-07-03 DIAGNOSIS — Z79899 Other long term (current) drug therapy: Secondary | ICD-10-CM | POA: Diagnosis not present

## 2015-07-03 DIAGNOSIS — F329 Major depressive disorder, single episode, unspecified: Secondary | ICD-10-CM | POA: Insufficient documentation

## 2015-07-03 DIAGNOSIS — R002 Palpitations: Secondary | ICD-10-CM

## 2015-07-03 DIAGNOSIS — I499 Cardiac arrhythmia, unspecified: Secondary | ICD-10-CM | POA: Diagnosis present

## 2015-07-03 HISTORY — DX: Essential (primary) hypertension: I10

## 2015-07-03 HISTORY — DX: Heart failure, unspecified: I50.9

## 2015-07-03 LAB — BASIC METABOLIC PANEL
ANION GAP: 10 (ref 5–15)
BUN: 21 mg/dL — ABNORMAL HIGH (ref 6–20)
CO2: 24 mmol/L (ref 22–32)
Calcium: 9 mg/dL (ref 8.9–10.3)
Chloride: 101 mmol/L (ref 101–111)
Creatinine, Ser: 1.45 mg/dL — ABNORMAL HIGH (ref 0.61–1.24)
GFR calc Af Amer: 55 mL/min — ABNORMAL LOW (ref >60)
GFR, EST NON AFRICAN AMERICAN: 47 mL/min — AB (ref >60)
GLUCOSE: 149 mg/dL — AB (ref 65–99)
POTASSIUM: 3.9 mmol/L (ref 3.5–5.1)
Sodium: 135 mmol/L (ref 135–145)

## 2015-07-03 LAB — PROTIME-INR
INR: 1.91
PROTHROMBIN TIME: 22 s — AB (ref 11.4–15.0)

## 2015-07-03 LAB — CBC
HEMATOCRIT: 37.8 % — AB (ref 40.0–52.0)
HEMOGLOBIN: 13.2 g/dL (ref 13.0–18.0)
MCH: 33 pg (ref 26.0–34.0)
MCHC: 35.1 g/dL (ref 32.0–36.0)
MCV: 94.1 fL (ref 80.0–100.0)
Platelets: 186 10*3/uL (ref 150–440)
RBC: 4.01 MIL/uL — ABNORMAL LOW (ref 4.40–5.90)
RDW: 14.6 % — ABNORMAL HIGH (ref 11.5–14.5)
WBC: 8.4 10*3/uL (ref 3.8–10.6)

## 2015-07-03 LAB — TROPONIN I: Troponin I: <0.03 ng/mL (ref <0.031)

## 2015-07-03 NOTE — ED Notes (Signed)
md in with pt again.  D/c inst to pt.  Family with pt

## 2015-07-03 NOTE — ED Notes (Signed)
Pt sent from cardiac rehab, reports he was having PVCs while exercising yesterday, today was having them while at rest. Pt denies any chest pain. Pt reports intermittent shortness of breath.

## 2015-07-03 NOTE — Progress Notes (Signed)
Cardiac Individual Treatment Plan  Patient Details  Name: Chase Cabrera MRN: [id number] Date of Birth: Mar 21, 1946 Referring Provider:  Kirk Ruths, MD  Initial Encounter Date:    Visit Diagnosis: S/P angioplasty with stent  Patient's Home Medications on Admission:  Current outpatient prescriptions:  .  amLODipine (NORVASC) 10 MG tablet, Take 1 tablet by mouth daily., Disp: , Rfl:  .  aspirin EC 81 MG tablet, Take 1 tablet by mouth daily., Disp: , Rfl:  .  calcium carbonate (OS-CAL - DOSED IN MG OF ELEMENTAL CALCIUM) 1250 (500 CA) MG tablet, Take 500 mg by mouth 2 (two) times daily., Disp: , Rfl:  .  carvedilol (COREG) 25 MG tablet, , Disp: , Rfl: 1 .  colchicine 0.6 MG tablet, Take 1 tablet by mouth daily., Disp: , Rfl: 0 .  diclofenac sodium (VOLTAREN) 1 % GEL, Apply 2 g topically 4 (four) times daily., Disp: , Rfl:  .  diltiazem (CARDIZEM) 30 MG tablet, Take 30 mg by mouth daily as needed. For afib, Disp: , Rfl:  .  furosemide (LASIX) 20 MG tablet, Take 1 tablet by mouth daily., Disp: , Rfl: 0 .  gabapentin (NEURONTIN) 300 MG capsule, Take 1 capsule by mouth 3 (three) times daily., Disp: , Rfl:  .  HUMALOG MIX 75/25 KWIKPEN (75-25) 100 UNIT/ML Kwikpen, Inject 20-40 Units into the skin 2 (two) times daily. 20 units subcutaneous before breakfast and 40 units before supper., Disp: , Rfl: 0 .  hydrALAZINE (APRESOLINE) 100 MG tablet, Take 1 tablet by mouth 3 (three) times daily., Disp: , Rfl:  .  hydrochlorothiazide (HYDRODIURIL) 25 MG tablet, Take 1 tablet by mouth daily., Disp: , Rfl:  .  HYDROcodone-acetaminophen (NORCO/VICODIN) 5-325 MG per tablet, Take 1 tablet by mouth every 6 (six) hours as needed. pain, Disp: , Rfl: 0 .  isosorbide mononitrate (IMDUR) 120 MG 24 hr tablet, Take 1 tablet by mouth daily., Disp: , Rfl:  .  JANUVIA 100 MG tablet, Take 1 tablet by mouth daily., Disp: , Rfl:  .  loratadine (CLARITIN) 10 MG tablet, Take 1 tablet by mouth daily before breakfast.,  Disp: , Rfl:  .  LYRICA 75 MG capsule, Take 75 mg by mouth 2 (two) times daily., Disp: , Rfl: 0 .  metFORMIN (GLUCOPHAGE) 1000 MG tablet, Take 1 tablet by mouth 2 (two) times daily., Disp: , Rfl: 0 .  Multiple Vitamin (MULTI-VITAMINS) TABS, Take 1 tablet by mouth daily., Disp: , Rfl:  .  nitroGLYCERIN (NITROSTAT) 0.4 MG SL tablet, Place 1 tablet under the tongue every 5 (five) minutes x 3 doses as needed. For chest pain.  Call 911 if no relief after 3 tablets, Disp: , Rfl:  .  Omega-3 Fatty Acids (FISH OIL) 1000 MG CAPS, Take 2 capsules by mouth 2 (two) times daily., Disp: , Rfl:  .  omeprazole (PRILOSEC) 40 MG capsule, Take 1 capsule by mouth 2 (two) times daily., Disp: , Rfl:  .  potassium chloride (K-DUR,KLOR-CON) 10 MEQ tablet, , Disp: , Rfl:  .  pravastatin (PRAVACHOL) 40 MG tablet, Take 40 mg by mouth daily., Disp: , Rfl: 0 .  predniSONE (DELTASONE) 10 MG tablet, Take 10-60 mg by mouth daily. 6 tabs by mouth, day 1, then Decrease by 51m (1 tab) daily until goned, Disp: , Rfl: 0 .  probenecid (BENEMID) 500 MG tablet, Take 1 tablet by mouth 2 (two) times daily., Disp: , Rfl: 0 .  tamsulosin (FLOMAX) 0.4 MG CAPS capsule, Take 1 capsule by  mouth daily., Disp: , Rfl:  .  valACYclovir (VALTREX) 1000 MG tablet, Take 1,000 mg by mouth 3 (three) times daily., Disp: , Rfl: 0 .  vitamin C (ASCORBIC ACID) 500 MG tablet, Take 1,000 mg by mouth daily., Disp: , Rfl:  .  warfarin (COUMADIN) 3 MG tablet, Take 1 tablet by mouth daily., Disp: , Rfl: 0  Past Medical History: Past Medical History  Diagnosis Date  . Myocardial infarction ([place])   . Arthritis   . Diabetes mellitus without complication ([place])   . Cancer ([place])   . Low blood potassium   . Hypertension   . CHF (congestive heart failure) ([facility])     Tobacco Use: History  Smoking status  . Never Smoker   Smokeless tobacco  . Never Used    Labs: Recent Review Flowsheet Data    There is no flowsheet data to display.       Exercise  Target Goals:    Exercise Program Goal: Individual exercise prescription set with THRR, safety & activity barriers. Participant demonstrates ability to understand and report RPE using BORG scale, to self-measure pulse accurately, and to acknowledge the importance of the exercise prescription.  Exercise Prescription Goal: Starting with aerobic activity 30 plus minutes a day, 3 days per week for initial exercise prescription. Provide home exercise prescription and guidelines that participant acknowledges understanding prior to discharge.  Activity Barriers & Risk Stratification:     Activity Barriers & Risk Stratification - 06/06/15 1146    Activity Barriers & Risk Stratification   Activity Barriers Arthritis;Joint Problems   Risk Stratification High      6 Minute Walk:     6 Minute Walk      06/05/15 1440       6 Minute Walk   Phase Initial     Distance 512 feet     Walk Time 4 minutes     Resting HR 54 bpm     Resting BP 122/52 mmHg     Max Ex. HR 86 bpm     Max Ex. BP 140/58 mmHg     RPE 11     Symptoms Yes (comment)     Comments SOB        Initial Exercise Prescription:     Initial Exercise Prescription - 06/05/15 1400    Date of Initial Exercise Prescription   Date 06/05/15   Treadmill   MPH 0.8   Grade 0   Minutes 10   Recumbant Bike   Level 1   RPM 40   Watts 20   Minutes 10   NuStep   Level 2   Watts 40   Minutes 10   Arm Ergometer   Level 1   Watts 10   Minutes 10   REL-XR   Level 1   Watts 40   Minutes 10   Prescription Details   Frequency (times per week) 3   Duration Progress to 30 minutes of continuous aerobic without signs/symptoms of physical distress   Intensity   THRR REST +  30   Ratings of Perceived Exertion 11-15   Perceived Dyspnea 2-4   Progression Continue progressive overload as per policy without signs/symptoms or physical distress.   Resistance Training   Training Prescription Yes   Weight 2   Reps 10-15       Exercise Prescription Changes:     Exercise Prescription Changes      06/18/15 1800 06/23/15 0700  Exercise Review   Progression  Yes      Response to Exercise   Blood Pressure (Admit)  122/60 mmHg      Blood Pressure (Exercise)  132/78 mmHg      Blood Pressure (Exit)  116/62 mmHg      Heart Rate (Admit)  62 bpm      Heart Rate (Exercise)  63 bpm      Heart Rate (Exit)  59 bpm      Rating of Perceived Exertion (Exercise)  12      Symptoms  None      Comments Third day of exercise. Patient was oriented to the gym and the equipment functions and settings. Procedures and policies of the gym were outlined and explained. The patient's individual exercise prescription and treatment plan were reviewed with them. All starting workloads were established based on the results of the functional testing  done at the initial intake visit. The plan for exercise progression was also introduced and progression will be customized based on the patient's performance and goals.  Reviewed individualized exercise prescription and made increases per departmental policy. Exercise increases were discussed with the patient and they were able to perform the new work loads without issue (no signs or symptoms).       Duration Progress to 30 minutes of continuous aerobic without signs/symptoms of physical distress Progress to 30 minutes of continuous aerobic without signs/symptoms of physical distress      Intensity Rest + 30 Rest + 30      Progression Continue progressive overload as per policy without signs/symptoms or physical distress. Continue progressive overload as per policy without signs/symptoms or physical distress.      Resistance Training   Training Prescription Yes Yes      Weight 2 2      Reps 10-15 10-15      Interval Training   Interval Training  No      NuStep   Level  2      Watts  30      Minutes  20      Recumbant Elliptical   Level  4  BioStep      Watts  30      Minutes  25          Discharge Exercise Prescription (Final Exercise Prescription Changes):     Exercise Prescription Changes - 06/23/15 0700    Exercise Review   Progression Yes   Response to Exercise   Blood Pressure (Admit) 122/60 mmHg   Blood Pressure (Exercise) 132/78 mmHg   Blood Pressure (Exit) 116/62 mmHg   Heart Rate (Admit) 62 bpm   Heart Rate (Exercise) 63 bpm   Heart Rate (Exit) 59 bpm   Rating of Perceived Exertion (Exercise) 12   Symptoms None   Comments Reviewed individualized exercise prescription and made increases per departmental policy. Exercise increases were discussed with the patient and they were able to perform the new work loads without issue (no signs or symptoms).    Duration Progress to 30 minutes of continuous aerobic without signs/symptoms of physical distress   Intensity Rest + 30   Progression Continue progressive overload as per policy without signs/symptoms or physical distress.   Resistance Training   Training Prescription Yes   Weight 2   Reps 10-15   Interval Training   Interval Training No   NuStep   Level 2   Watts 30   Minutes 20   Recumbant Elliptical   Level  4  BioStep   Watts 30   Minutes 25      Nutrition:  Target Goals: Understanding of nutrition guidelines, daily intake of sodium <1560m, cholesterol <2045m calories 30% from fat and 7% or less from saturated fats, daily to have 5 or more servings of fruits and vegetables.  Biometrics:     Pre Biometrics - 06/05/15 1443    Pre Biometrics   Height 5' 6.5" (1.689 m)   Weight 190 lb 6.4 oz (86.365 kg)   Waist Circumference 43.5 inches   Hip Circumference 47 inches   Waist to Hip Ratio 0.93 %   BMI (Calculated) 30.3       Nutrition Therapy Plan and Nutrition Goals:     Nutrition Therapy & Goals - 06/06/15 1512    Nutrition Therapy   Drug/Food Interactions Statins/Certain Fruits;Coumadin/Vit K   Intervention Plan   Intervention Using nutrition plan and personal goals to  gain a healthy nutrition lifestyle. Add exercise as prescribed.      Nutrition Discharge: Rate Your Plate Scores:   Nutrition Goals Re-Evaluation:     Nutrition Goals Re-Evaluation      06/18/15 1359           Personal Goal #1 Re-Evaluation   Personal Goal #1 WAs in Cardiac Rehab 3 years ago cont to try to eat healthy.        Goal Progress Seen Yes          Psychosocial: Target Goals: Acknowledge presence or absence of depression, maximize coping skills, provide positive support system. Participant is able to verbalize types and ability to use techniques and skills needed for reducing stress and depression.  Initial Review & Psychosocial Screening:     Initial Psych Review & Screening - 06/06/15 [place]   Barriers   Psychosocial barriers to participate in program There are no identifiable barriers or psychosocial needs.      Quality of Life Scores:     Quality of Life - 06/06/15 1144    Quality of Life Scores   Health/Function Pre 22.17 %   Socioeconomic Pre 25.21 %   Psych/Spiritual Pre 28.21 %   Family Pre 28 %   GLOBAL Pre 24.9 %      PHQ-9:     Recent Review Flowsheet Data    [facility]   Decreased Interest 0   Down, Depressed, Hopeless 0   PHQ - 2 Score 0   Altered sleeping 2   Tired, decreased energy 0   Change in appetite 0   Feeling bad or failure about yourself  0   Trouble concentrating 0   Moving slowly or fidgety/restless 0   Suicidal thoughts 0   PHQ-9 Score 2   Difficult doing work/chores Not difficult at all      Psychosocial Evaluation and Intervention:   Psychosocial Re-Evaluation:     Psychosocial Re-Evaluation      06/18/15 1400           Psychosocial Re-Evaluation   Interventions Encouraged to attend Cardiac Rehabilitation for the exercise       Comments RiKhiaid he knew he needed to exerise more and things just kept coming up.            Vocational Rehabilitation: Provide vocational rehab assistance to qualifying candidates.   Vocational Rehab Evaluation & Intervention:     Vocational Rehab - 06/06/15 1510    Initial Vocational  Rehab Evaluation & Intervention   Assessment shows need for Vocational Rehabilitation No      Education: Education Goals: Education classes will be provided on a weekly basis, covering required topics. Participant will state understanding/return demonstration of topics presented.  Learning Barriers/Preferences:     Learning Barriers/Preferences - 06/06/15 1510    Learning Barriers/Preferences   Learning Barriers None   Learning Preferences Group Instruction      Education Topics: General Nutrition Guidelines/Fats and Fiber: -Group instruction provided by verbal, written material, models and posters to present the general guidelines for heart healthy nutrition. Gives an explanation and review of dietary fats and fiber.          Cardiac Rehab from 06/23/2015 in [facility] Cardiac Rehab   Date  06/23/15   Educator  PI   Instruction Review Code  2- meets goals/outcomes      Controlling Sodium/Reading Food Labels: -Group verbal and written material supporting the discussion of sodium use in heart healthy nutrition. Review and explanation with models, verbal and written materials for utilization of the food label.   Exercise Physiology & Risk Factors: - Group verbal and written instruction with models to review the exercise physiology of the cardiovascular system and associated critical values. Details cardiovascular disease risk factors and the goals associated with each risk factor.   Aerobic Exercise & Resistance Training: - Gives group verbal and written discussion on the health impact of inactivity. On the components of aerobic and resistive training programs and the benefits of this training and how to safely progress through these programs.   Flexibility, Balance, General  Exercise Guidelines: - Provides group verbal and written instruction on the benefits of flexibility and balance training programs. Provides general exercise guidelines with specific guidelines to those with heart or lung disease. Demonstration and skill practice provided.   Stress Management: - Provides group verbal and written instruction about the health risks of elevated stress, cause of high stress, and healthy ways to reduce stress.   Depression: - Provides group verbal and written instruction on the correlation between heart/lung disease and depressed mood, treatment options, and the stigmas associated with seeking treatment.   Anatomy & Physiology of the Heart: - Group verbal and written instruction and models provide basic cardiac anatomy and physiology, with the coronary electrical and arterial systems. Review of: AMI, Angina, Valve disease, Heart Failure, Cardiac Arrhythmia, Pacemakers, and the ICD.   Cardiac Procedures: - Group verbal and written instruction and models to describe the testing methods done to diagnose heart disease. Reviews the outcomes of the test results. Describes the treatment choices: Medical Management, Angioplasty, or Coronary Bypass Surgery.   Cardiac Medications: - Group verbal and written instruction to review commonly prescribed medications for heart disease. Reviews the medication, class of the drug, and side effects. Includes the steps to properly store meds and maintain the prescription regimen.   Go Sex-Intimacy & Heart Disease, Get SMART - Goal Setting: - Group verbal and written instruction through game format to discuss heart disease and the return to sexual intimacy. Provides group verbal and written material to discuss and apply goal setting through the application of the S.M.A.R.T. Method.   Other Matters of the Heart: - Provides group verbal, written materials and models to describe Heart Failure, Angina, Valve Disease, and Diabetes in the  realm of heart disease. Includes description of the disease process and treatment options available to the cardiac patient.      Cardiac Rehab from 06/23/2015 in [facility] Cardiac Rehab  Date  06/18/15   Educator  [place]   Instruction Review Code  2- meets goals/outcomes      Exercise & Equipment Safety: - Individual verbal instruction and demonstration of equipment use and safety with use of the equipment.      Cardiac Rehab from 06/23/2015 in [facility] Cardiac Rehab   Date  06/06/15   Educator  C. Aamina Skiff,RN      Infection Prevention: - Provides verbal and written material to individual with discussion of infection control including proper hand washing and proper equipment cleaning during exercise session.      Cardiac Rehab from 06/23/2015 in [facility] Cardiac Rehab   Date  06/06/15   Educator  C. EnterkinRN      Falls Prevention: - Provides verbal and written material to individual with discussion of falls prevention and safety.      Cardiac Rehab from 06/23/2015 in [facility] Cardiac Rehab   Date  06/06/15   Educator  C. Rolla Servidio,RN   Instruction Review Code  1- partially meets, needs review/practice      Diabetes: - Individual verbal and written instruction to review signs/symptoms of diabetes, desired ranges of glucose level fasting, after meals and with exercise. Advice that pre and post exercise glucose checks will be done for 3 sessions at entry of program.      Cardiac Rehab from 06/23/2015 in [facility] Cardiac Rehab   Date  06/06/15   Educator  [place]   Instruction Review Code  1- partially meets, needs review/practice       Knowledge Questionnaire Score:     Knowledge Questionnaire Score - 06/06/15 1504    Knowledge Questionnaire Score   Pre Score 20      Personal Goals and Risk Factors at Admission:     Personal Goals and Risk Factors at Admission - 06/06/15 1145    Personal Goals and Risk Factors on Admission    Weight Management Yes   Intervention Learn and follow  the exercise and diet guidelines while in the program. Utilize the nutrition and education classes to help gain knowledge of the diet and exercise expectations in the program   Increase Aerobic Exercise and Physical Activity Yes   Intervention While in program, learn and follow the exercise prescription taught. Start at a low level workload and increase workload after able to maintain previous level for 30 minutes. Increase time before increasing intensity.   Understand more about Heart/Pulmonary Disease. Yes   Intervention While in program utilize professionals for any questions, and attend the education sessions. Great websites to use are www.americanheart.org or www.lung.org for reliable information.   Increase knowledge of respiratory medications and ability to use respiratory devices properly.  Yes   Diabetes Yes   Goal Blood glucose control identified by blood glucose values, HgbA1C. Participant verbalizes understanding of the signs/symptoms of hyper/hypo glycemia, proper foot care and importance of medication and nutrition plan for blood glucose control.   Intervention Provide nutrition & aerobic exercise along with prescribed medications to achieve blood glucose in normal ranges: Fasting 65-99 mg/dL   Hypertension Yes   Goal Participant will see blood pressure controlled within the values of 140/46m/Hg or within value directed by their physician.   Intervention Provide nutrition & aerobic exercise along with prescribed medications to achieve BP 140/90 or less.      Personal Goals and Risk Factors Review:      Goals and Risk Factor Review      06/18/15 1402 07/03/15 1703  Increase Aerobic Exercise and Physical Activity   Goals Progress/Improvement seen  Yes No      Comments Elery likes the new Biostep Exercise piece of equipment and said it is easier for him to exercise on.  Robby Bornemann did not complete his rehab session.  Mrs. Posten walked in at the beginning of Cardiac  REhab and said here is my phone number and I want to take him to the Emer[facility]. Dept to be checked out since he told me you had him quit exercising last evening in Cardiac Rehab when he had a lot of irregular beats after exercesing a while sitting on the Nustep. Today when he arrived and before any exercise when he was just sitting a chair for approx at least 5-10 minutes he started having a lot of PVC's every other beat. Blood pressure was stable. No c/o. Recently Lasix dose was doubled but he is on potassium also. So taken to [place] Dept via wheelchair and report given to triage RN with copy of rhythm strip      Diabetes   Goal  Blood glucose control identified by blood glucose values, HgbA1C. Participant verbalizes understanding of the signs/symptoms of hyper/hypo glycemia, proper foot care and importance of medication and nutrition plan for blood glucose control.  Stable blood glucose readings in CR.       Hypertension   Goal  --  Orthostatic bp done by Roanna Epley ,RN last evening and was good. since [place] c//o of a little bit if dizziness.          Personal Goals Discharge (Final Personal Goals and Risk Factors Review):      Goals and Risk Factor Review - 07/03/15 1703    Increase Aerobic Exercise and Physical Activity   Goals Progress/Improvement seen  No   Comments Moustafa Mossa did not complete his rehab session.  Mrs. Branam walked in at the beginning of Cardiac REhab and said here is my phone number and I want to take him to the Emerg. Dept to be checked out since he told me you had him quit exercising last evening in Cardiac Rehab when he had a lot of irregular beats after exercesing a while sitting on the Nustep. Today when he arrived and before any exercise when he was just sitting a chair for approx at least 5-10 minutes he started having a lot of PVC's every other beat. Blood pressure was stable. No c/o. Recently Lasix dose was doubled but he is on potassium also. So taken to [place] Dept via wheelchair and report given to triage RN with copy of rhythm strip   Diabetes   Goal Blood glucose control identified by blood glucose values, HgbA1C. Participant verbalizes understanding of the signs/symptoms of hyper/hypo glycemia, proper foot care and importance of medication and nutrition plan for blood glucose control.  Stable blood glucose readings in CR.    Hypertension   Goal --  Orthostatic bp done by Roanna Epley ,RN last evening and was good. since [place] c//o of a little bit if dizziness.        Comments: Wiley Magan did not complete his rehab session.  Mrs. Bellisario walked in at the beginning of Cardiac REhab and said here is my phone number and I want to take him to the Emerg. Dept to be checked out since he told me you had him quit exercising last evening in Cardiac Rehab when he had a lot of irregular beats after exercesing a while sitting  on the Nustep. Today when he arrived and before any exercise when he was just sitting a chair for approx at least 5-10 minutes he started having a lot of PVC's every other beat. Blood pressure was stable. No c/o. Recently Lasix dose was doubled but he is on potassium also. So taken to [place] via wheelchair and report given to triage RN with copy of rhythm strip. Blood pressure was 116/60.

## 2015-07-03 NOTE — ED Provider Notes (Signed)
[facility] Emergency Department Provider Note  ____________________________________________  Time seen: 1626  I have reviewed the triage vital signs and the nursing notes.   HISTORY  Chief Complaint Irregular Heart Beat     HPI Chase Cabrera is a 69 y.o. male with an extensive cardiac history including quadruple bypass and multiple stents. He last had a stent placed proximally 4 months ago due to dyspnea on exertion. He went to cardiac rehabilitation today, where they noted multiple PVCs. The patient reports he has had additional PVCs over the past2 days, although he has had some PVCs in the past as well. He does have a history of atrial fibrillation and underwent an ablation at one point. He does not have atrial fibrillation currently.  The patient denies having had any chest pain yesterday or today. He does report having a small amount of chest pain in his left chest currently.  He does report that he has had intermittent shortness of breath. This seems to be worse over the past few days. This includes dyspnea on exertion as well as episodes of shortness of breath simply while sitting.  The patient did not undergo any exercise or cardiac rehabilitation work today. The frequent PVCs are noted on a monitor at cardiac rehabilitation prior to beginning any activity. The patient does feel these PVCs. Here in the emergency department, he frequently comments "there it goes again" as I can see PVC is noted on the monitor.  Past Medical History  Diagnosis Date  . Myocardial infarction ([place])   . Arthritis   . Diabetes mellitus without complication ([place])   . Cancer ([place])   . Low blood potassium   . Hypertension   . CHF (congestive heart failure) ([place])     There are no active problems to display for this patient.   Past Surgical History  Procedure Laterality Date  . Cardiac surgery    . Gallblader    . Cardiac catheterization    . Coronary angioplasty    .  Coronary angioplasty with stent placement  03/28/2015    Distal 80% to normal Stent, Dilation Balloon    Current Outpatient Rx  Name  Route  Sig  Dispense  Refill  . amLODipine (NORVASC) 10 MG tablet   Oral   Take 1 tablet by mouth daily.         Marland Kitchen aspirin EC 81 MG tablet   Oral   Take 1 tablet by mouth daily.         . calcium carbonate (OS-CAL - DOSED IN MG OF ELEMENTAL CALCIUM) 1250 (500 CA) MG tablet   Oral   Take 500 mg by mouth 2 (two) times daily.         . carvedilol (COREG) 25 MG tablet   Oral   Take 25 mg by mouth 2 (two) times daily with a meal.       1   . clopidogrel (PLAVIX) 75 MG tablet   Oral   Take 75 mg by mouth daily.         . colchicine 0.6 MG tablet   Oral   Take 1 tablet by mouth daily.      0   . diclofenac sodium (VOLTAREN) 1 % GEL   Topical   Apply 2 g topically 4 (four) times daily.         Marland Kitchen diltiazem (CARDIZEM) 30 MG tablet   Oral   Take 30 mg by mouth daily as needed. For afib         .  furosemide (LASIX) 20 MG tablet   Oral   Take 20-40 mg by mouth daily. 20 one day and 40 the next day. Alternate daily. Had 31m on 07-02-2015      0   . gabapentin (NEURONTIN) 300 MG capsule   Oral   Take 1 capsule by mouth 3 (three) times daily.         .Marland KitchenHUMALOG MIX 75/25 KWIKPEN (75-25) 100 UNIT/ML Kwikpen   Subcutaneous   Inject 30-44 Units into the skin 2 (two) times daily. 30 units every morning and 44 units in the evening.      0     Dispense as written.   . hydrALAZINE (APRESOLINE) 100 MG tablet   Oral   Take 1 tablet by mouth 3 (three) times daily.         .Marland KitchenHYDROcodone-acetaminophen (NORCO/VICODIN) 5-325 MG per tablet   Oral   Take 1 tablet by mouth every 6 (six) hours as needed. pain      0   . isosorbide mononitrate (IMDUR) 120 MG 24 hr tablet   Oral   Take 1 tablet by mouth daily.         .Marland KitchenJANUVIA 100 MG tablet   Oral   Take 1 tablet by mouth daily.           Dispense as written.   .  loratadine (CLARITIN) 10 MG tablet   Oral   Take 1 tablet by mouth daily before breakfast.         . LYRICA 75 MG capsule   Oral   Take 75 mg by mouth 2 (two) times daily.      0     Dispense as written.   . metFORMIN (GLUCOPHAGE) 1000 MG tablet   Oral   Take 1 tablet by mouth 2 (two) times daily.      0   . Multiple Vitamin (MULTI-VITAMINS) TABS   Oral   Take 1 tablet by mouth daily.         .Marland Kitchenomeprazole (PRILOSEC) 40 MG capsule   Oral   Take 1 capsule by mouth 2 (two) times daily.         . potassium chloride (K-DUR,KLOR-CON) 10 MEQ tablet   Oral   Take 10 mEq by mouth 2 (two) times daily.          . pravastatin (PRAVACHOL) 40 MG tablet   Oral   Take 40 mg by mouth daily.      0   . probenecid (BENEMID) 500 MG tablet   Oral   Take 1 tablet by mouth 2 (two) times daily.      0   . tamsulosin (FLOMAX) 0.4 MG CAPS capsule   Oral   Take 1 capsule by mouth daily.         . vitamin C (ASCORBIC ACID) 500 MG tablet   Oral   Take 1,000 mg by mouth daily.         .Marland Kitchenwarfarin (COUMADIN) 3 MG tablet   Oral   Take 4 mg by mouth daily.       0   . EXPIRED: nitroGLYCERIN (NITROSTAT) 0.4 MG SL tablet   Sublingual   Place 1 tablet under the tongue every 5 (five) minutes x 3 doses as needed. For chest pain.  Call 911 if no relief after 3 tablets           Allergies Allopurinol; Atenolol; Atorvastatin; Ramipril; Simvastatin; Valsartan; and Rosuvastatin  No family  history on file.  Social History Social History  Substance Use Topics  . Smoking status: Never Smoker   . Smokeless tobacco: Never Used  . Alcohol Use: No    Review of Systems  Constitutional: Negative for fever. ENT: Negative for sore throat. Cardiovascular: Palpitations, PVCs. See history of present illness. Patient has a small amount of focal left chest pain.Marland Kitchen Respiratory: Negative for cough. Positive for intermittent shortness of breath, both on exertion as well as at  rest. Gastrointestinal: Negative for abdominal pain, vomiting and diarrhea. Genitourinary: Negative for dysuria. Musculoskeletal: No myalgias or injuries. Skin: Negative for rash. Neurological: Negative for paresthesia or weakness   10-point ROS otherwise negative.  ____________________________________________   PHYSICAL EXAM:  VITAL SIGNS: ED Triage Vitals  Enc Vitals Group     BP 07/03/15 1607 128/57 mmHg     Pulse Rate 07/03/15 1607 58     Resp 07/03/15 1607 16     Temp 07/03/15 1607 97.5 F (36.4 C)     Temp Source 07/03/15 1607 Oral     SpO2 07/03/15 1607 94 %     Weight 07/03/15 1607 190 lb (86.183 kg)     Height 07/03/15 1607 5' 8"  (1.727 m)     Head Cir --      Peak Flow --      Pain Score 07/03/15 1608 0     Pain Loc --      Pain Edu? --      Excl. in [place]? --     Constitutional: Alert and oriented. Patient appears somewhat subdued, quiet, almost a depressed affect. ENT   Head: Normocephalic and atraumatic.   Nose: No congestion/rhinnorhea.    Cardiovascular: Normal rate, but with intermittent PVCs with a short on his following the PVC. no murmur noted Respiratory:  Normal respiratory effort, no tachypnea.    Breath sounds are clear and equal bilaterally.  Gastrointestinal: Soft and nontender. No distention.  Back: No muscle spasm, no tenderness, no CVA tenderness. Musculoskeletal: No deformity noted. Nontender with normal range of motion in all extremities.  No noted edema. Neurologic:  Normal speech and language. No gross focal neurologic deficits are appreciated.  Skin:  Skin is warm, dry. No rash noted. Psychiatric: Alert and communicative. Cognition intact. Somewhat subdued, slightly depressed affect. ____________________________________________    LABS (pertinent positives/negatives)  Labs Reviewed  BASIC METABOLIC PANEL - Abnormal; Notable for the following:    Glucose, Bld 149 (*)    BUN 21 (*)    Creatinine, Ser 1.45 (*)    GFR calc non  Af Amer 47 (*)    GFR calc Af Amer 55 (*)    All other components within normal limits  CBC - Abnormal; Notable for the following:    RBC 4.01 (*)    HCT 37.8 (*)    RDW 14.6 (*)    All other components within normal limits  PROTIME-INR - Abnormal; Notable for the following:    Prothrombin Time 22.0 (*)    All other components within normal limits  TROPONIN I     ____________________________________________   EKG ED ECG REPORT I, Malyiah Fellows W, the attending physician, personally viewed and interpreted this ECG.   Date: 07/03/2015  EKG Time: 1612  Rate: 58  Rhythm: Sinus bradycardia with multiple PVCs, 3 noted on this 12 second EKG.  Axis: -27  Intervals: Normal  ST&T Change: None noted   ____________________________________________    RADIOLOGY  Chest x-ray: FINDINGS: Prior median sternotomy. Lumbar spine fixation,  incompletely imaged. Right rotator cuff repair. Midline trachea. Mild cardiomegaly. No pleural effusion or pneumothorax. No congestive failure. A probable calcified granuloma projecting over the left lung base on the frontal. There may be scattered smaller right lung calcified granulomas.  IMPRESSION: Cardiomegaly without congestive failure.  ____________________________________________   PROCEDURES  ____________________________________________   INITIAL IMPRESSION / ASSESSMENT AND PLAN / ED COURSE  Pertinent labs & imaging results that were available during my care of the patient were reviewed by me and considered in my medical decision making (see chart for details).  69 year old male with a complex past cardiac history now with PVCs, palpitations, and intermittent shortness of breath. The shortness of breath does seem to coincide with the PVCs. He also reports a small amount of chest pain in the left chest. This is in a fairly focal area, approximately 4-5 inches in diameter over the left pectoralis muscle. No tenderness on  palpation.  Labs are pending. We will assess electrolytes for any abnormality that could increase the frequency of these PVCs. Troponin is pending as well. The patient is on Coumadin, Plavix, and aspirin. An INR is currently pending as well.  ----------------------------------------- 6:24 PM on 07/03/2015 -----------------------------------------  Electrolytes are reasonable with potassium at 3.9 and sodium of 135. Renal function is mildly elevated at BUN of 21 and creatinine of 1.45. Hemoglobin is normal at 13.2. White blood cells normal at 8.4. Troponin is negative. INR of 1.91  The patient appears to be stable and in no acute distress. He is a little bit anxious, understandably, about the palpitations and PVCs.    ----------------------------------------- 6:47 PM on 07/03/2015 -----------------------------------------  I've called and spoken with cardiology at [facility], Dr. Lillia Corporal.  She agrees with discharge and patient home and will arrange for close follow-up. Should [facility] cardiology office will call the patient tomorrow to arrange for him to be seen.  ____________________________________________   FINAL CLINICAL IMPRESSION(S) / ED DIAGNOSES  Final diagnoses:  Palpitations  PVC's (premature ventricular contractions)      Ahmed Prima, MD 07/03/15 [phone]

## 2015-07-03 NOTE — Discharge Instructions (Signed)
Your EKG looked good. Your blood pressure is good. Your episodes of shortness of breath correlate with the PVCs you're having. Overall this is stable. I discussed this with Dr. Lillia Corporal at [facility]. She will have the [facility] cardiology office call you tomorrow morning so you can be seen tomorrow. If you have worsening symptoms or chest pain or other urgent concerns, return to the emergency department.  Premature Ventricular Contraction A premature ventricular contraction is an irregularity in the normal heart rhythm. These contractions are extra heartbeats that occur too early in the normal sequence. In most cases, these contractions are harmless and do not require treatment. CAUSES Premature ventricular contractions may occur without a known cause. In healthy people, the extra contractions may be caused by:  Smoking.  Drinking alcohol.  Caffeine.  Certain medicines.  Some illegal drugs.  Stress. Sometimes, changes in chemicals in the blood (electrolytes) can also cause premature ventricular contractions. They can also occur in people with heart diseases that cause a decrease in blood flow to the heart. SIGNS AND SYMPTOMS Premature ventricular contractions often do not cause any symptoms. In some cases, you may have a feeling of your heart beating fast or skipping a beat (palpitations). DIAGNOSIS Your health care provider will take your medical history and do a physical exam. During the exam, the health care provider will check for irregular heartbeats. Various tests may be done to help diagnose premature ventricular contractions. These tests may include:  An ECG (electrocardiogram) to monitor the electrical activity of your heart.  Holter monitor testing. A Holter monitor is a portable device that can monitor the electrical activity of your heart over longer periods of time.  Stress tests to see how exercise affects your heart rhythm.  Echocardiogram. This test uses sound waves (ultrasound) to  produce an image of your heart.  Electrophysiology study. This is used to evaluate the electrical conduction system of your heart. TREATMENT Usually, no treatment is needed. You may be advised to avoid things that can trigger the premature contractions, such as caffeine or alcohol. Medicines are sometimes given if symptoms are severe or if the extra heartbeats are very frequent. Treatment may also be needed for an underlying cause of the contractions if one is found. HOME CARE INSTRUCTIONS  Take medicines only as directed by your health care provider.  Make any lifestyle changes recommended by your health care provider. These may include:  Quitting smoking.  Avoiding or limiting caffeine or alcohol.  Exercising. Talk to your health care provider about what type of exercise is safe for you.  Trying to reduce stress.  Keep all follow-up visits with your health care provider. This is important. SEEK IMMEDIATE MEDICAL CARE IF:  You feel palpitations that are frequent or continual.  You have chest pain.  You have shortness of breath.  You have sweating for no reason.  You have nausea and vomiting.  You become light-headed or faint.   This information is not intended to replace advice given to you by your health care provider. Make sure you discuss any questions you have with your health care provider.   Document Released: 04/16/2004 Document Revised: 09/20/2014 Document Reviewed: 01/31/2014 Elsevier Interactive Patient Education [employer].

## 2015-07-03 NOTE — ED Notes (Signed)
Pt resting with eyes closed.  Iv in place.  Family with pt.  Sinus brady on monitor with occ pvc.

## 2015-07-03 NOTE — ED Notes (Signed)
Pt was in cardiac cath rehab this afternoon.  Pt felt irregular heartbeat and began having sob.  Pt recently got stents at [place].  States sob is worse today.  pvc's on monitor.  Iv strted stat, md at bedside with pt and family.  Pt placed on 2 liters oxygen.

## 2015-07-03 NOTE — Progress Notes (Signed)
Incomplete Session Note  Patient Details  Name: Si Jachim MRN: [id number] Date of Birth: October 14, 1945 Referring Provider:  Kirk Ruths, MD  Hilton Sinclair did not complete his rehab session.  Mrs. Reisch walked in at the beginning of Cardiac REhab and said here is my phone number and I want to take him to the Emerg. Dept to be checked out since he told me you had him quit exercising last evening in Cardiac Rehab when he had a lot of irregular beats after exercesing a while sitting on the [place]. Today when he arrived and before any exercise when he was just sitting a chair for approx at least 5-10 minutes he started having a lot of PVC's every other beat. Blood pressure was stable. No c/o. Recently Lasix dose was doubled but he is on potassium also. So taken to [place] via wheelchair and report given to triage RN with copy of rhythm strips.

## 2015-07-04 ENCOUNTER — Encounter: Payer: Self-pay | Admitting: *Deleted

## 2015-07-04 NOTE — Progress Notes (Unsigned)
Cardiac Individual Treatment Plan  Patient Details  Name: Chase Cabrera MRN: [id number] Date of Birth: 07-Oct-1945 Referring Provider:  No ref. provider found  Initial Encounter Date:    Visit Diagnosis: No diagnosis found.  Patient's Home Medications on Admission:  Current outpatient prescriptions:  .  amLODipine (NORVASC) 10 MG tablet, Take 1 tablet by mouth daily., Disp: , Rfl:  .  aspirin EC 81 MG tablet, Take 1 tablet by mouth daily., Disp: , Rfl:  .  calcium carbonate (OS-CAL - DOSED IN MG OF ELEMENTAL CALCIUM) 1250 (500 CA) MG tablet, Take 500 mg by mouth 2 (two) times daily., Disp: , Rfl:  .  carvedilol (COREG) 25 MG tablet, Take 25 mg by mouth 2 (two) times daily with a meal. , Disp: , Rfl: 1 .  clopidogrel (PLAVIX) 75 MG tablet, Take 75 mg by mouth daily., Disp: , Rfl:  .  colchicine 0.6 MG tablet, Take 1 tablet by mouth daily., Disp: , Rfl: 0 .  diclofenac sodium (VOLTAREN) 1 % GEL, Apply 2 g topically 4 (four) times daily., Disp: , Rfl:  .  diltiazem (CARDIZEM) 30 MG tablet, Take 30 mg by mouth daily as needed. For afib, Disp: , Rfl:  .  furosemide (LASIX) 20 MG tablet, Take 20-40 mg by mouth daily. 20 one day and 40 the next day. Alternate daily. Had 56m on 07-02-2015, Disp: , Rfl: 0 .  gabapentin (NEURONTIN) 300 MG capsule, Take 1 capsule by mouth 3 (three) times daily., Disp: , Rfl:  .  HUMALOG MIX 75/25 KWIKPEN (75-25) 100 UNIT/ML Kwikpen, Inject 30-44 Units into the skin 2 (two) times daily. 30 units every morning and 44 units in the evening., Disp: , Rfl: 0 .  hydrALAZINE (APRESOLINE) 100 MG tablet, Take 1 tablet by mouth 3 (three) times daily., Disp: , Rfl:  .  HYDROcodone-acetaminophen (NORCO/VICODIN) 5-325 MG per tablet, Take 1 tablet by mouth every 6 (six) hours as needed. pain, Disp: , Rfl: 0 .  isosorbide mononitrate (IMDUR) 120 MG 24 hr tablet, Take 1 tablet by mouth daily., Disp: , Rfl:  .  JANUVIA 100 MG tablet, Take 1 tablet by mouth daily., Disp: , Rfl:  .   loratadine (CLARITIN) 10 MG tablet, Take 1 tablet by mouth daily before breakfast., Disp: , Rfl:  .  LYRICA 75 MG capsule, Take 75 mg by mouth 2 (two) times daily., Disp: , Rfl: 0 .  metFORMIN (GLUCOPHAGE) 1000 MG tablet, Take 1 tablet by mouth 2 (two) times daily., Disp: , Rfl: 0 .  Multiple Vitamin (MULTI-VITAMINS) TABS, Take 1 tablet by mouth daily., Disp: , Rfl:  .  nitroGLYCERIN (NITROSTAT) 0.4 MG SL tablet, Place 1 tablet under the tongue every 5 (five) minutes x 3 doses as needed. For chest pain.  Call 911 if no relief after 3 tablets, Disp: , Rfl:  .  omeprazole (PRILOSEC) 40 MG capsule, Take 1 capsule by mouth 2 (two) times daily., Disp: , Rfl:  .  potassium chloride (K-DUR,KLOR-CON) 10 MEQ tablet, Take 10 mEq by mouth 2 (two) times daily. , Disp: , Rfl:  .  pravastatin (PRAVACHOL) 40 MG tablet, Take 40 mg by mouth daily., Disp: , Rfl: 0 .  probenecid (BENEMID) 500 MG tablet, Take 1 tablet by mouth 2 (two) times daily., Disp: , Rfl: 0 .  tamsulosin (FLOMAX) 0.4 MG CAPS capsule, Take 1 capsule by mouth daily., Disp: , Rfl:  .  vitamin C (ASCORBIC ACID) 500 MG tablet, Take 1,000 mg by mouth daily.,  Disp: , Rfl:  .  warfarin (COUMADIN) 3 MG tablet, Take 4 mg by mouth daily. , Disp: , Rfl: 0  Past Medical History: Past Medical History  Diagnosis Date  . Myocardial infarction ([place])   . Arthritis   . Diabetes mellitus without complication ([place])   . Cancer ([place])   . Low blood potassium   . Hypertension   . CHF (congestive heart failure) ([facility])     Tobacco Use: History  Smoking status  . Never Smoker   Smokeless tobacco  . Never Used    Labs: Recent Review Flowsheet Data    There is no flowsheet data to display.       Exercise Target Goals:    Exercise Program Goal: Individual exercise prescription set with THRR, safety & activity barriers. Participant demonstrates ability to understand and report RPE using BORG scale, to self-measure pulse accurately, and to acknowledge  the importance of the exercise prescription.  Exercise Prescription Goal: Starting with aerobic activity 30 plus minutes a day, 3 days per week for initial exercise prescription. Provide home exercise prescription and guidelines that participant acknowledges understanding prior to discharge.  Activity Barriers & Risk Stratification:     Activity Barriers & Risk Stratification - 06/06/15 1146    Activity Barriers & Risk Stratification   Activity Barriers Arthritis;Joint Problems   Risk Stratification High      6 Minute Walk:     6 Minute Walk      06/05/15 1440       6 Minute Walk   Phase Initial     Distance 512 feet     Walk Time 4 minutes     Resting HR 54 bpm     Resting BP 122/52 mmHg     Max Ex. HR 86 bpm     Max Ex. BP 140/58 mmHg     RPE 11     Symptoms Yes (comment)     Comments SOB        Initial Exercise Prescription:     Initial Exercise Prescription - 06/05/15 1400    Date of Initial Exercise Prescription   Date 06/05/15   Treadmill   MPH 0.8   Grade 0   Minutes 10   Recumbant Bike   Level 1   RPM 40   Watts 20   Minutes 10   NuStep   Level 2   Watts 40   Minutes 10   Arm Ergometer   Level 1   Watts 10   Minutes 10   REL-XR   Level 1   Watts 40   Minutes 10   Prescription Details   Frequency (times per week) 3   Duration Progress to 30 minutes of continuous aerobic without signs/symptoms of physical distress   Intensity   THRR REST +  30   Ratings of Perceived Exertion 11-15   Perceived Dyspnea 2-4   Progression Continue progressive overload as per policy without signs/symptoms or physical distress.   Resistance Training   Training Prescription Yes   Weight 2   Reps 10-15      Exercise Prescription Changes:     Exercise Prescription Changes      06/18/15 1800 06/23/15 0700         Exercise Review   Progression  Yes      Response to Exercise   Blood Pressure (Admit)  122/60 mmHg      Blood Pressure (Exercise)  132/78  mmHg  Blood Pressure (Exit)  116/62 mmHg      Heart Rate (Admit)  62 bpm      Heart Rate (Exercise)  63 bpm      Heart Rate (Exit)  59 bpm      Rating of Perceived Exertion (Exercise)  12      Symptoms  None      Comments Third day of exercise. Patient was oriented to the gym and the equipment functions and settings. Procedures and policies of the gym were outlined and explained. The patient's individual exercise prescription and treatment plan were reviewed with them. All starting workloads were established based on the results of the functional testing  done at the initial intake visit. The plan for exercise progression was also introduced and progression will be customized based on the patient's performance and goals.  Reviewed individualized exercise prescription and made increases per departmental policy. Exercise increases were discussed with the patient and they were able to perform the new work loads without issue (no signs or symptoms).       Duration Progress to 30 minutes of continuous aerobic without signs/symptoms of physical distress Progress to 30 minutes of continuous aerobic without signs/symptoms of physical distress      Intensity Rest + 30 Rest + 30      Progression Continue progressive overload as per policy without signs/symptoms or physical distress. Continue progressive overload as per policy without signs/symptoms or physical distress.      Resistance Training   Training Prescription Yes Yes      Weight 2 2      Reps 10-15 10-15      Interval Training   Interval Training  No      NuStep   Level  2      Watts  30      Minutes  20      Recumbant Elliptical   Level  4  BioStep      Watts  30      Minutes  25         Discharge Exercise Prescription (Final Exercise Prescription Changes):     Exercise Prescription Changes - 06/23/15 0700    Exercise Review   Progression Yes   Response to Exercise   Blood Pressure (Admit) 122/60 mmHg   Blood Pressure  (Exercise) 132/78 mmHg   Blood Pressure (Exit) 116/62 mmHg   Heart Rate (Admit) 62 bpm   Heart Rate (Exercise) 63 bpm   Heart Rate (Exit) 59 bpm   Rating of Perceived Exertion (Exercise) 12   Symptoms None   Comments Reviewed individualized exercise prescription and made increases per departmental policy. Exercise increases were discussed with the patient and they were able to perform the new work loads without issue (no signs or symptoms).    Duration Progress to 30 minutes of continuous aerobic without signs/symptoms of physical distress   Intensity Rest + 30   Progression Continue progressive overload as per policy without signs/symptoms or physical distress.   Resistance Training   Training Prescription Yes   Weight 2   Reps 10-15   Interval Training   Interval Training No   NuStep   Level 2   Watts 30   Minutes 20   Recumbant Elliptical   Level 4  BioStep   Watts 30   Minutes 25      Nutrition:  Target Goals: Understanding of nutrition guidelines, daily intake of sodium <1515m, cholesterol <2049m calories 30% from fat and 7% or less  from saturated fats, daily to have 5 or more servings of fruits and vegetables.  Biometrics:     Pre Biometrics - 06/05/15 1443    Pre Biometrics   Height 5' 6.5" (1.689 m)   Weight 190 lb 6.4 oz (86.365 kg)   Waist Circumference 43.5 inches   Hip Circumference 47 inches   Waist to Hip Ratio 0.93 %   BMI (Calculated) 30.3       Nutrition Therapy Plan and Nutrition Goals:     Nutrition Therapy & Goals - 06/06/15 1512    Nutrition Therapy   Drug/Food Interactions Statins/Certain Fruits;Coumadin/Vit K   Intervention Plan   Intervention Using nutrition plan and personal goals to gain a healthy nutrition lifestyle. Add exercise as prescribed.      Nutrition Discharge: Rate Your Plate Scores:   Nutrition Goals Re-Evaluation:     Nutrition Goals Re-Evaluation      06/18/15 1359           Personal Goal #1 Re-Evaluation    Personal Goal #1 WAs in Cardiac Rehab 3 years ago cont to try to eat healthy.        Goal Progress Seen Yes          Psychosocial: Target Goals: Acknowledge presence or absence of depression, maximize coping skills, provide positive support system. Participant is able to verbalize types and ability to use techniques and skills needed for reducing stress and depression.  Initial Review & Psychosocial Screening:     Initial Psych Review & Screening - 06/06/15 [place]? Yes   Barriers   Psychosocial barriers to participate in program There are no identifiable barriers or psychosocial needs.      Quality of Life Scores:     Quality of Life - 06/06/15 1144    Quality of Life Scores   Health/Function Pre 22.17 %   Socioeconomic Pre 25.21 %   Psych/Spiritual Pre 28.21 %   Family Pre 28 %   GLOBAL Pre 24.9 %      PHQ-9:     Recent Review Flowsheet Data    Depression screen [facility] 2/9 06/06/2015   Decreased Interest 0   Down, Depressed, Hopeless 0   PHQ - 2 Score 0   Altered sleeping 2   Tired, decreased energy 0   Change in appetite 0   Feeling bad or failure about yourself  0   Trouble concentrating 0   Moving slowly or fidgety/restless 0   Suicidal thoughts 0   PHQ-9 Score 2   Difficult doing work/chores Not difficult at all      Psychosocial Evaluation and Intervention:   Psychosocial Re-Evaluation:     Psychosocial Re-Evaluation      06/18/15 1400 07/03/15 1707         Psychosocial Re-Evaluation   Interventions Encouraged to attend Cardiac Rehabilitation for the exercise Stress management education      Comments Roderick said he knew he needed to exerise more and things just kept coming up.  Harvie was taken via wheelchair to [employer] [facility]. He is tired of going to the MD.          Vocational Rehabilitation: Provide vocational rehab assistance to qualifying candidates.   Vocational Rehab Evaluation & Intervention:      Vocational Rehab - 06/06/15 1510    Initial Vocational Rehab Evaluation & Intervention   Assessment shows need for Vocational Rehabilitation No      Education: Education Goals:  Education classes will be provided on a weekly basis, covering required topics. Participant will state understanding/return demonstration of topics presented.  Learning Barriers/Preferences:     Learning Barriers/Preferences - 06/06/15 1510    Learning Barriers/Preferences   Learning Barriers None   Learning Preferences Group Instruction      Education Topics: General Nutrition Guidelines/Fats and Fiber: -Group instruction provided by verbal, written material, models and posters to present the general guidelines for heart healthy nutrition. Gives an explanation and review of dietary fats and fiber.          Cardiac Rehab from 06/23/2015 in [facility] Cardiac Rehab   Date  06/23/15   Educator  PI   Instruction Review Code  2- meets goals/outcomes      Controlling Sodium/Reading Food Labels: -Group verbal and written material supporting the discussion of sodium use in heart healthy nutrition. Review and explanation with models, verbal and written materials for utilization of the food label.   Exercise Physiology & Risk Factors: - Group verbal and written instruction with models to review the exercise physiology of the cardiovascular system and associated critical values. Details cardiovascular disease risk factors and the goals associated with each risk factor.   Aerobic Exercise & Resistance Training: - Gives group verbal and written discussion on the health impact of inactivity. On the components of aerobic and resistive training programs and the benefits of this training and how to safely progress through these programs.   Flexibility, Balance, General Exercise Guidelines: - Provides group verbal and written instruction on the benefits of flexibility and balance training programs. Provides general  exercise guidelines with specific guidelines to those with heart or lung disease. Demonstration and skill practice provided.   Stress Management: - Provides group verbal and written instruction about the health risks of elevated stress, cause of high stress, and healthy ways to reduce stress.   Depression: - Provides group verbal and written instruction on the correlation between heart/lung disease and depressed mood, treatment options, and the stigmas associated with seeking treatment.   Anatomy & Physiology of the Heart: - Group verbal and written instruction and models provide basic cardiac anatomy and physiology, with the coronary electrical and arterial systems. Review of: AMI, Angina, Valve disease, Heart Failure, Cardiac Arrhythmia, Pacemakers, and the ICD.   Cardiac Procedures: - Group verbal and written instruction and models to describe the testing methods done to diagnose heart disease. Reviews the outcomes of the test results. Describes the treatment choices: Medical Management, Angioplasty, or Coronary Bypass Surgery.   Cardiac Medications: - Group verbal and written instruction to review commonly prescribed medications for heart disease. Reviews the medication, class of the drug, and side effects. Includes the steps to properly store meds and maintain the prescription regimen.   Go Sex-Intimacy & Heart Disease, Get SMART - Goal Setting: - Group verbal and written instruction through game format to discuss heart disease and the return to sexual intimacy. Provides group verbal and written material to discuss and apply goal setting through the application of the S.M.A.R.T. Method.   Other Matters of the Heart: - Provides group verbal, written materials and models to describe Heart Failure, Angina, Valve Disease, and Diabetes in the realm of heart disease. Includes description of the disease process and treatment options available to the cardiac patient.      Cardiac Rehab from  06/23/2015 in [facility] Cardiac Rehab   Date  06/18/15   Educator  [place]   Instruction Review Code  2- meets goals/outcomes  Exercise & Equipment Safety: - Individual verbal instruction and demonstration of equipment use and safety with use of the equipment.      Cardiac Rehab from 06/23/2015 in [facility] Cardiac Rehab   Date  06/06/15   Educator  C. Zya Finkle,RN      Infection Prevention: - Provides verbal and written material to individual with discussion of infection control including proper hand washing and proper equipment cleaning during exercise session.      Cardiac Rehab from 06/23/2015 in [facility] Cardiac Rehab   Date  06/06/15   Educator  C. EnterkinRN      Falls Prevention: - Provides verbal and written material to individual with discussion of falls prevention and safety.      Cardiac Rehab from 06/23/2015 in [facility] Cardiac Rehab   Date  06/06/15   Educator  C. Julion Gatt,RN   Instruction Review Code  1- partially meets, needs review/practice      Diabetes: - Individual verbal and written instruction to review signs/symptoms of diabetes, desired ranges of glucose level fasting, after meals and with exercise. Advice that pre and post exercise glucose checks will be done for 3 sessions at entry of program.      Cardiac Rehab from 06/23/2015 in [facility] Cardiac Rehab   Date  06/06/15   Educator  [place]   Instruction Review Code  1- partially meets, needs review/practice       Knowledge Questionnaire Score:     Knowledge Questionnaire Score - 06/06/15 1504    Knowledge Questionnaire Score   Pre Score 20      Personal Goals and Risk Factors at Admission:     Personal Goals and Risk Factors at Admission - 06/06/15 1145    Personal Goals and Risk Factors on Admission    Weight Management Yes   Intervention Learn and follow the exercise and diet guidelines while in the program. Utilize the nutrition and education classes to help gain knowledge of the diet and exercise  expectations in the program   Increase Aerobic Exercise and Physical Activity Yes   Intervention While in program, learn and follow the exercise prescription taught. Start at a low level workload and increase workload after able to maintain previous level for 30 minutes. Increase time before increasing intensity.   Understand more about Heart/Pulmonary Disease. Yes   Intervention While in program utilize professionals for any questions, and attend the education sessions. Great websites to use are www.americanheart.org or www.lung.org for reliable information.   Increase knowledge of respiratory medications and ability to use respiratory devices properly.  Yes   Diabetes Yes   Goal Blood glucose control identified by blood glucose values, HgbA1C. Participant verbalizes understanding of the signs/symptoms of hyper/hypo glycemia, proper foot care and importance of medication and nutrition plan for blood glucose control.   Intervention Provide nutrition & aerobic exercise along with prescribed medications to achieve blood glucose in normal ranges: Fasting 65-99 mg/dL   Hypertension Yes   Goal Participant will see blood pressure controlled within the values of 140/8m/Hg or within value directed by their physician.   Intervention Provide nutrition & aerobic exercise along with prescribed medications to achieve BP 140/90 or less.      Personal Goals and Risk Factors Review:      Goals and Risk Factor Review      06/18/15 1402 07/03/15 1703 07/04/15 1029       Increase Aerobic Exercise and Physical Activity   Goals Progress/Improvement seen  Yes No No  Comments Loic likes the [employer] Exercise piece of equipment and said it is easier for him to exercise on.  Cleavon Bartmess did not complete his rehab session.  Mrs. Plate walked in at the beginning of Cardiac REhab and said here is my phone number and I want to take him to the Emerg. Dept to be checked out since he told me you had him quit  exercising last evening in Cardiac Rehab when he had a lot of irregular beats after exercesing a while sitting on the [place]. Today when he arrived and before any exercise when he was just sitting a chair for approx at least 5-10 minutes he started having a lot of PVC's every other beat. Blood pressure was stable. No c/o. Recently Lasix dose was doubled but he is on potassium also. So taken to [place] via wheelchair and report given to triage RN with copy of rhythm strip See Emerg Dept note (discharged from [place] after several hours to be followed up by Dr. Lillia Corporal at [facility] Cardiology).     Diabetes   Goal  Blood glucose control identified by blood glucose values, HgbA1C. Participant verbalizes understanding of the signs/symptoms of hyper/hypo glycemia, proper foot care and importance of medication and nutrition plan for blood glucose control.  Stable blood glucose readings in CR.       Hypertension   Goal  --  Orthostatic bp done by Roanna Epley ,RN last evening and was good. since Antwyne c//o of a little bit if dizziness.          Personal Goals Discharge (Final Personal Goals and Risk Factors Review):      Goals and Risk Factor Review - 07/04/15 1029    Increase Aerobic Exercise and Physical Activity   Goals Progress/Improvement seen  No   Comments See Emerg Dept note (discharged from [facility] after several hours to be followed up by Dr. Lillia Corporal at [facility] Cardiology).       Comments: See [facility] Emerg Dept note (discharged from [place] after several hours to be followed up by Dr. Lillia Corporal at [facility] Cardiology).

## 2015-07-09 ENCOUNTER — Encounter: Payer: Medicare Other | Admitting: *Deleted

## 2015-07-09 DIAGNOSIS — Z9861 Coronary angioplasty status: Secondary | ICD-10-CM

## 2015-07-09 LAB — GLUCOSE, CAPILLARY: GLUCOSE-CAPILLARY: 144 mg/dL — AB (ref 65–99)

## 2015-07-09 NOTE — Progress Notes (Signed)
Daily Session Note  Patient Details  Name: Chase Cabrera MRN: [id number] Date of Birth: October 06, 1945 Referring Provider:  Concha Pyo, MD  Encounter Date: 07/09/2015  Check In:     Session Check In - 07/09/15 1823    Check-In   Staff Present Gerlene Burdock RN, BSN;Renee Dillard [place] MS, ACSM CEP Exercise Physiologist;Diane Mariana Arn, BSN   ER physicians immediately available to respond to emergencies See telemetry face sheet for immediately available ER MD   Medication changes reported     No   Fall or balance concerns reported    No   Warm-up and Cool-down Performed on first and last piece of equipment   VAD Patient? No   Pain Assessment   Currently in Pain? No/denies         Goals Met:  Independence with exercise equipment Exercise tolerated well No report of cardiac concerns or symptoms Strength training completed today  Goals Unmet:  Not Applicable  Goals Comments: Patient's wife accompanied him to educational session on Depression today.  FSBS 171 at beginning of educational session and 144 at the end of exercise.  Patient tolerated well the exercise component of the rehab session.  Patient is progressing.     Dr. Emily Filbert is Medical Director for [place] and [facility].

## 2015-07-10 DIAGNOSIS — Z9861 Coronary angioplasty status: Secondary | ICD-10-CM

## 2015-07-10 LAB — GLUCOSE, CAPILLARY
Glucose-Capillary: 192 mg/dL — ABNORMAL HIGH (ref 65–99)
Glucose-Capillary: 156 mg/dL — ABNORMAL HIGH (ref 65–99)

## 2015-07-10 NOTE — Progress Notes (Signed)
Daily Session Note  Patient Details  Name: Chase Cabrera MRN: [id number] Date of Birth: 12/25/1945 Referring Provider:  Kirk Ruths, MD  Encounter Date: 07/10/2015  Check In:     Session Check In - 07/10/15 1630    Check-In   Staff Present Lestine Box BS, ACSM EP-C, Exercise Physiologist;Carroll Enterkin RN, BSN;Diane Joya Gaskins RN, BSN   ER physicians immediately available to respond to emergencies See telemetry face sheet for immediately available ER MD   Medication changes reported     No   Fall or balance concerns reported    No   Warm-up and Cool-down Performed on first and last piece of equipment   VAD Patient? No   Pain Assessment   Currently in Pain? No/denies         Goals Met:  Proper associated with RPD/PD & O2 Sat Exercise tolerated well No report of cardiac concerns or symptoms Strength training completed today  Goals Unmet:  Not Applicable  Goals Comments:    Dr. Emily Filbert is Medical Director for [place] and [facility].

## 2015-07-14 ENCOUNTER — Encounter: Payer: Self-pay | Admitting: *Deleted

## 2015-07-14 DIAGNOSIS — Z9861 Coronary angioplasty status: Secondary | ICD-10-CM

## 2015-07-14 NOTE — Progress Notes (Signed)
Daily Session Note  Patient Details  Name: Chase Cabrera MRN: [id number] Date of Birth: 01/23/1946 Referring Provider:  Concha Pyo, MD  Encounter Date: 07/14/2015  Check In:     Session Check In - 07/14/15 1629    Check-In   Staff Present Lestine Box BS, ACSM EP-C, Exercise Physiologist;Susanne Bice RN, BSN, CCRP;Carroll Enterkin RN, BSN   ER physicians immediately available to respond to emergencies See telemetry face sheet for immediately available ER MD   Medication changes reported     No   Fall or balance concerns reported    No   Warm-up and Cool-down Performed on first and last piece of equipment   VAD Patient? No   Pain Assessment   Currently in Pain? No/denies           Exercise Prescription Changes - 07/14/15 1100    Exercise Review   Progression Yes   Response to Exercise   Blood Pressure (Admit) 114/66 mmHg   Blood Pressure (Exercise) 156/64 mmHg   Blood Pressure (Exit) 114/58 mmHg   Heart Rate (Admit) 60 bpm   Heart Rate (Exercise) 69 bpm   Heart Rate (Exit) 83 bpm   Rating of Perceived Exertion (Exercise) 13   Symptoms Went to ER on 10/20 due to bigeminy heart rhythm. HE was cleared by MD to return and hasn't had any issues since.   Comments Made increases to the level on the BioStep. Limuel had dropped his level after his episode of bigeminy and we have now increased it back to Level 4. Monroe completed this with no issue.    Duration Progress to 30 minutes of continuous aerobic without signs/symptoms of physical distress   Intensity Rest + 30   Progression Continue progressive overload as per policy without signs/symptoms or physical distress.   Resistance Training   Training Prescription Yes   Weight 2   Reps 10-15   Interval Training   Interval Training No   NuStep   Level 2   Watts 30   Minutes 20   Recumbant Elliptical   Level 4  BioStep   Watts 35   Minutes 25      Goals Met:  Proper associated with RPD/PD & O2 Sat Exercise  tolerated well No report of cardiac concerns or symptoms Strength training completed today  Goals Unmet:  Not Applicable  Goals Comments:   Dr. Emily Filbert is Medical Director for [facility] and [facility].

## 2015-07-16 ENCOUNTER — Encounter: Payer: Medicare Other | Attending: Internal Medicine

## 2015-07-16 DIAGNOSIS — Z79899 Other long term (current) drug therapy: Secondary | ICD-10-CM | POA: Insufficient documentation

## 2015-07-16 DIAGNOSIS — Z792 Long term (current) use of antibiotics: Secondary | ICD-10-CM | POA: Insufficient documentation

## 2015-07-16 DIAGNOSIS — Z9861 Coronary angioplasty status: Secondary | ICD-10-CM | POA: Insufficient documentation

## 2015-07-16 DIAGNOSIS — Z794 Long term (current) use of insulin: Secondary | ICD-10-CM | POA: Insufficient documentation

## 2015-07-16 DIAGNOSIS — Z9889 Other specified postprocedural states: Secondary | ICD-10-CM | POA: Insufficient documentation

## 2015-07-17 DIAGNOSIS — Z79899 Other long term (current) drug therapy: Secondary | ICD-10-CM | POA: Diagnosis not present

## 2015-07-17 DIAGNOSIS — Z9889 Other specified postprocedural states: Secondary | ICD-10-CM | POA: Diagnosis present

## 2015-07-17 DIAGNOSIS — Z792 Long term (current) use of antibiotics: Secondary | ICD-10-CM | POA: Diagnosis not present

## 2015-07-17 DIAGNOSIS — Z9861 Coronary angioplasty status: Secondary | ICD-10-CM

## 2015-07-17 DIAGNOSIS — Z794 Long term (current) use of insulin: Secondary | ICD-10-CM | POA: Diagnosis not present

## 2015-07-17 NOTE — Progress Notes (Signed)
Daily Session Note  Patient Details  Name: Chase Cabrera MRN: [id number] Date of Birth: 16-Mar-1946 Referring Provider:  Concha Pyo, MD  Encounter Date: 07/17/2015  Check In:     Session Check In - 07/17/15 1701    Check-In   Staff Present Gerlene Burdock RN, BSN;Steven Way BS, ACSM EP-C, Exercise Physiologist;Diane Mariana Arn, BSN   ER physicians immediately available to respond to emergencies See telemetry face sheet for immediately available ER MD   Medication changes reported     No   Fall or balance concerns reported    No   Warm-up and Cool-down Performed on first and last piece of equipment   VAD Patient? No   Pain Assessment   Currently in Pain? No/denies         Goals Met:  Proper associated with RPD/PD & O2 Sat Exercise tolerated well No report of cardiac concerns or symptoms  Goals Unmet:  Not Applicable  Goals Comments:    Dr. Emily Filbert is Medical Director for [facility] and [facility].

## 2015-07-21 ENCOUNTER — Encounter: Payer: Medicare Other | Admitting: *Deleted

## 2015-07-21 DIAGNOSIS — Z9861 Coronary angioplasty status: Secondary | ICD-10-CM | POA: Diagnosis not present

## 2015-07-21 NOTE — Progress Notes (Signed)
Cardiac Individual Treatment Plan  Patient Details  Name: Chase Cabrera MRN: [id number] Date of Birth: 03/08/1946 Referring Provider:  Concha Pyo, MD  Initial Encounter Date:    Visit Diagnosis: S/P angioplasty with stent  Patient's Home Medications on Admission:  Current outpatient prescriptions:  .  amLODipine (NORVASC) 10 MG tablet, Take 1 tablet by mouth daily., Disp: , Rfl:  .  aspirin EC 81 MG tablet, Take 1 tablet by mouth daily., Disp: , Rfl:  .  calcium carbonate (OS-CAL - DOSED IN MG OF ELEMENTAL CALCIUM) 1250 (500 CA) MG tablet, Take 500 mg by mouth 2 (two) times daily., Disp: , Rfl:  .  carvedilol (COREG) 25 MG tablet, Take 25 mg by mouth 2 (two) times daily with a meal. , Disp: , Rfl: 1 .  clopidogrel (PLAVIX) 75 MG tablet, Take 75 mg by mouth daily., Disp: , Rfl:  .  colchicine 0.6 MG tablet, Take 1 tablet by mouth daily., Disp: , Rfl: 0 .  diclofenac sodium (VOLTAREN) 1 % GEL, Apply 2 g topically 4 (four) times daily., Disp: , Rfl:  .  diltiazem (CARDIZEM) 30 MG tablet, Take 30 mg by mouth daily as needed. For afib, Disp: , Rfl:  .  furosemide (LASIX) 20 MG tablet, Take 20-40 mg by mouth daily. 20 one day and 40 the next day. Alternate daily. Had 15m on 07-02-2015, Disp: , Rfl: 0 .  gabapentin (NEURONTIN) 300 MG capsule, Take 1 capsule by mouth 3 (three) times daily., Disp: , Rfl:  .  HUMALOG MIX 75/25 KWIKPEN (75-25) 100 UNIT/ML Kwikpen, Inject 30-44 Units into the skin 2 (two) times daily. 30 units every morning and 44 units in the evening., Disp: , Rfl: 0 .  hydrALAZINE (APRESOLINE) 100 MG tablet, Take 1 tablet by mouth 3 (three) times daily., Disp: , Rfl:  .  HYDROcodone-acetaminophen (NORCO/VICODIN) 5-325 MG per tablet, Take 1 tablet by mouth every 6 (six) hours as needed. pain, Disp: , Rfl: 0 .  isosorbide mononitrate (IMDUR) 120 MG 24 hr tablet, Take 1 tablet by mouth daily., Disp: , Rfl:  .  JANUVIA 100 MG tablet, Take 1 tablet by mouth daily., Disp: , Rfl:  .   loratadine (CLARITIN) 10 MG tablet, Take 1 tablet by mouth daily before breakfast., Disp: , Rfl:  .  LYRICA 75 MG capsule, Take 75 mg by mouth 2 (two) times daily., Disp: , Rfl: 0 .  metFORMIN (GLUCOPHAGE) 1000 MG tablet, Take 1 tablet by mouth 2 (two) times daily., Disp: , Rfl: 0 .  Multiple Vitamin (MULTI-VITAMINS) TABS, Take 1 tablet by mouth daily., Disp: , Rfl:  .  nitroGLYCERIN (NITROSTAT) 0.4 MG SL tablet, Place 1 tablet under the tongue every 5 (five) minutes x 3 doses as needed. For chest pain.  Call 911 if no relief after 3 tablets, Disp: , Rfl:  .  omeprazole (PRILOSEC) 40 MG capsule, Take 1 capsule by mouth 2 (two) times daily., Disp: , Rfl:  .  potassium chloride (K-DUR,KLOR-CON) 10 MEQ tablet, Take 10 mEq by mouth 2 (two) times daily. , Disp: , Rfl:  .  pravastatin (PRAVACHOL) 40 MG tablet, Take 40 mg by mouth daily., Disp: , Rfl: 0 .  probenecid (BENEMID) 500 MG tablet, Take 1 tablet by mouth 2 (two) times daily., Disp: , Rfl: 0 .  tamsulosin (FLOMAX) 0.4 MG CAPS capsule, Take 1 capsule by mouth daily., Disp: , Rfl:  .  vitamin C (ASCORBIC ACID) 500 MG tablet, Take 1,000 mg by mouth daily.,  Disp: , Rfl:  .  warfarin (COUMADIN) 3 MG tablet, Take 4 mg by mouth daily. , Disp: , Rfl: 0  Past Medical History: Past Medical History  Diagnosis Date  . Myocardial infarction ([place])   . Arthritis   . Diabetes mellitus without complication ([place])   . Cancer ([place])   . Low blood potassium   . Hypertension   . CHF (congestive heart failure) ([facility])     Tobacco Use: History  Smoking status  . Never Smoker   Smokeless tobacco  . Never Used    Labs: Recent Review Flowsheet Data    There is no flowsheet data to display.       Exercise Target Goals:    Exercise Program Goal: Individual exercise prescription set with THRR, safety & activity barriers. Participant demonstrates ability to understand and report RPE using BORG scale, to self-measure pulse accurately, and to acknowledge  the importance of the exercise prescription.  Exercise Prescription Goal: Starting with aerobic activity 30 plus minutes a day, 3 days per week for initial exercise prescription. Provide home exercise prescription and guidelines that participant acknowledges understanding prior to discharge.  Activity Barriers & Risk Stratification:     Activity Barriers & Risk Stratification - 06/06/15 1146    Activity Barriers & Risk Stratification   Activity Barriers Arthritis;Joint Problems   Risk Stratification High      6 Minute Walk:     6 Minute Walk      06/05/15 1440       6 Minute Walk   Phase Initial     Distance 512 feet     Walk Time 4 minutes     Resting HR 54 bpm     Resting BP 122/52 mmHg     Max Ex. HR 86 bpm     Max Ex. BP 140/58 mmHg     RPE 11     Symptoms Yes (comment)     Comments SOB        Initial Exercise Prescription:     Initial Exercise Prescription - 06/05/15 1400    Date of Initial Exercise Prescription   Date 06/05/15   Treadmill   MPH 0.8   Grade 0   Minutes 10   Recumbant Bike   Level 1   RPM 40   Watts 20   Minutes 10   NuStep   Level 2   Watts 40   Minutes 10   Arm Ergometer   Level 1   Watts 10   Minutes 10   REL-XR   Level 1   Watts 40   Minutes 10   Prescription Details   Frequency (times per week) 3   Duration Progress to 30 minutes of continuous aerobic without signs/symptoms of physical distress   Intensity   THRR REST +  30   Ratings of Perceived Exertion 11-15   Perceived Dyspnea 2-4   Progression Continue progressive overload as per policy without signs/symptoms or physical distress.   Resistance Training   Training Prescription Yes   Weight 2   Reps 10-15      Exercise Prescription Changes:     Exercise Prescription Changes      06/18/15 1800 06/23/15 0700 07/14/15 1100       Exercise Review   Progression  Yes Yes     Response to Exercise   Blood Pressure (Admit)  122/60 mmHg 114/66 mmHg     Blood  Pressure (Exercise)  132/78 mmHg 156/64 mmHg  Blood Pressure (Exit)  116/62 mmHg 114/58 mmHg     Heart Rate (Admit)  62 bpm 60 bpm     Heart Rate (Exercise)  63 bpm 69 bpm     Heart Rate (Exit)  59 bpm 83 bpm     Rating of Perceived Exertion (Exercise)  12 13     Symptoms  None Went to ER on 10/20 due to bigeminy heart rhythm. HE was cleared by MD to return and hasn't had any issues since.     Comments Third day of exercise. Patient was oriented to the gym and the equipment functions and settings. Procedures and policies of the gym were outlined and explained. The patient's individual exercise prescription and treatment plan were reviewed with them. All starting workloads were established based on the results of the functional testing  done at the initial intake visit. The plan for exercise progression was also introduced and progression will be customized based on the patient's performance and goals.  Reviewed individualized exercise prescription and made increases per departmental policy. Exercise increases were discussed with the patient and they were able to perform the new work loads without issue (no signs or symptoms).  Made increases to the level on the BioStep. Chase Cabrera had dropped his level after his episode of bigeminy and we have now increased it back to Level 4. Chase Cabrera completed this with no issue.      Duration Progress to 30 minutes of continuous aerobic without signs/symptoms of physical distress Progress to 30 minutes of continuous aerobic without signs/symptoms of physical distress Progress to 30 minutes of continuous aerobic without signs/symptoms of physical distress     Intensity Rest + 30 Rest + 30 Rest + 30     Progression Continue progressive overload as per policy without signs/symptoms or physical distress. Continue progressive overload as per policy without signs/symptoms or physical distress. Continue progressive overload as per policy without signs/symptoms or physical  distress.     Resistance Training   Training Prescription Yes Yes Yes     Weight 2 2 2      Reps 10-15 10-15 10-15     Interval Training   Interval Training  No No     NuStep   Level  2 2     Watts  30 30     Minutes  20 20     Recumbant Elliptical   Level  4  BioStep 4  BioStep     Watts  30 35     Minutes  25 25        Discharge Exercise Prescription (Final Exercise Prescription Changes):     Exercise Prescription Changes - 07/14/15 1100    Exercise Review   Progression Yes   Response to Exercise   Blood Pressure (Admit) 114/66 mmHg   Blood Pressure (Exercise) 156/64 mmHg   Blood Pressure (Exit) 114/58 mmHg   Heart Rate (Admit) 60 bpm   Heart Rate (Exercise) 69 bpm   Heart Rate (Exit) 83 bpm   Rating of Perceived Exertion (Exercise) 13   Symptoms Went to ER on 10/20 due to bigeminy heart rhythm. HE was cleared by MD to return and hasn't had any issues since.   Comments Made increases to the level on the BioStep. Chase Cabrera had dropped his level after his episode of bigeminy and we have now increased it back to Level 4. Chase Cabrera completed this with no issue.    Duration Progress to 30 minutes of continuous aerobic without signs/symptoms of physical  distress   Intensity Rest + 30   Progression Continue progressive overload as per policy without signs/symptoms or physical distress.   Resistance Training   Training Prescription Yes   Weight 2   Reps 10-15   Interval Training   Interval Training No   NuStep   Level 2   Watts 30   Minutes 20   Recumbant Elliptical   Level 4  BioStep   Watts 35   Minutes 25      Nutrition:  Target Goals: Understanding of nutrition guidelines, daily intake of sodium <1563m, cholesterol <2073m calories 30% from fat and 7% or less from saturated fats, daily to have 5 or more servings of fruits and vegetables.  Biometrics:     Pre Biometrics - 06/05/15 1443    Pre Biometrics   Height 5' 6.5" (1.689 m)   Weight 190 lb 6.4 oz  (86.365 kg)   Waist Circumference 43.5 inches   Hip Circumference 47 inches   Waist to Hip Ratio 0.93 %   BMI (Calculated) 30.3       Nutrition Therapy Plan and Nutrition Goals:     Nutrition Therapy & Goals - 06/06/15 1512    Nutrition Therapy   Drug/Food Interactions Statins/Certain Fruits;Coumadin/Vit K   Intervention Plan   Intervention Using nutrition plan and personal goals to gain a healthy nutrition lifestyle. Add exercise as prescribed.      Nutrition Discharge: Rate Your Plate Scores:   Nutrition Goals Re-Evaluation:     Nutrition Goals Re-Evaluation      06/18/15 1359 07/21/15 1816         Personal Goal #1 Re-Evaluation   Personal Goal #1 WAs in Cardiac Rehab 3 years ago cont to try to eat healthy.        Goal Progress Seen Yes Yes      Comments  Chase Cabrera and his wife are very knowledgable about salt, sodium saturated fats etc what not to eat.          Psychosocial: Target Goals: Acknowledge presence or absence of depression, maximize coping skills, provide positive support system. Participant is able to verbalize types and ability to use techniques and skills needed for reducing stress and depression.  Initial Review & Psychosocial Screening:     Initial Psych Review & Screening - 06/06/15 15VilliscaYes   Barriers   Psychosocial barriers to participate in program There are no identifiable barriers or psychosocial needs.      Quality of Life Scores:     Quality of Life - 06/06/15 1144    Quality of Life Scores   Health/Function Pre 22.17 %   Socioeconomic Pre 25.21 %   Psych/Spiritual Pre 28.21 %   Family Pre 28 %   GLOBAL Pre 24.9 %      PHQ-9:     Recent Review Flowsheet Data    Depression screen [employer]9 06/06/2015   Decreased Interest 0   Down, Depressed, Hopeless 0   PHQ - 2 Score 0   Altered sleeping 2   Tired, decreased energy 0   Change in appetite 0   Feeling bad or failure about yourself   0   Trouble concentrating 0   Moving slowly or fidgety/restless 0   Suicidal thoughts 0   PHQ-9 Score 2   Difficult doing work/chores Not difficult at all      Psychosocial Evaluation and Intervention:   Psychosocial Re-Evaluation:  Psychosocial Re-Evaluation      06/18/15 1400 07/03/15 1707 07/21/15 1817       Psychosocial Re-Evaluation   Interventions Encouraged to attend Cardiac Rehabilitation for the exercise Stress management education Encouraged to attend Cardiac Rehabilitation for the exercise     Comments Chase Cabrera said he knew he needed to exerise more and things just kept coming up.  Chase Cabrera was taken via wheelchair to [employer] [facility]. He is tired of going to the MD.  Chase Cabrera has some stress in his life with 7 stents and his wife is having ankle problems which is bothering her exercise and walking.         Vocational Rehabilitation: Provide vocational rehab assistance to qualifying candidates.   Vocational Rehab Evaluation & Intervention:     Vocational Rehab - 06/06/15 1510    Initial Vocational Rehab Evaluation & Intervention   Assessment shows need for Vocational Rehabilitation No      Education: Education Goals: Education classes will be provided on a weekly basis, covering required topics. Participant will state understanding/return demonstration of topics presented.  Learning Barriers/Preferences:     Learning Barriers/Preferences - 06/06/15 1510    Learning Barriers/Preferences   Learning Barriers None   Learning Preferences Group Instruction      Education Topics: General Nutrition Guidelines/Fats and Fiber: -Group instruction provided by verbal, written material, models and posters to present the general guidelines for heart healthy nutrition. Gives an explanation and review of dietary fats and fiber.          Cardiac Rehab from 07/21/2015 in [facility] Cardiac Rehab   Date  06/23/15   Educator  PI   Instruction Review Code  2- meets goals/outcomes       Controlling Sodium/Reading Food Labels: -Group verbal and written material supporting the discussion of sodium use in heart healthy nutrition. Review and explanation with models, verbal and written materials for utilization of the food label.   Exercise Physiology & Risk Factors: - Group verbal and written instruction with models to review the exercise physiology of the cardiovascular system and associated critical values. Details cardiovascular disease risk factors and the goals associated with each risk factor.   Aerobic Exercise & Resistance Training: - Gives group verbal and written discussion on the health impact of inactivity. On the components of aerobic and resistive training programs and the benefits of this training and how to safely progress through these programs.      Cardiac Rehab from 07/21/2015 in [facility] Cardiac Rehab   Date  07/21/15   Educator  S. Way   Instruction Review Code  2- meets goals/outcomes      Flexibility, Balance, General Exercise Guidelines: - Provides group verbal and written instruction on the benefits of flexibility and balance training programs. Provides general exercise guidelines with specific guidelines to those with heart or lung disease. Demonstration and skill practice provided.   Stress Management: - Provides group verbal and written instruction about the health risks of elevated stress, cause of high stress, and healthy ways to reduce stress.   Depression: - Provides group verbal and written instruction on the correlation between heart/lung disease and depressed mood, treatment options, and the stigmas associated with seeking treatment.      Cardiac Rehab from 07/21/2015 in [facility] Cardiac Rehab   Date  07/09/15   Educator  Kathreen Cornfield   Instruction Review Code  2- meets goals/outcomes      Anatomy & Physiology of the Heart: - Group verbal and written instruction and models provide  basic cardiac anatomy and physiology, with the coronary  electrical and arterial systems. Review of: AMI, Angina, Valve disease, Heart Failure, Cardiac Arrhythmia, Pacemakers, and the ICD.   Cardiac Procedures: - Group verbal and written instruction and models to describe the testing methods done to diagnose heart disease. Reviews the outcomes of the test results. Describes the treatment choices: Medical Management, Angioplasty, or Coronary Bypass Surgery.   Cardiac Medications: - Group verbal and written instruction to review commonly prescribed medications for heart disease. Reviews the medication, class of the drug, and side effects. Includes the steps to properly store meds and maintain the prescription regimen.      Cardiac Rehab from 07/21/2015 in [facility] Cardiac Rehab   Date  07/14/15   Educator  SB   Instruction Review Code  2- meets goals/outcomes      Go Sex-Intimacy & Heart Disease, Get SMART - Goal Setting: - Group verbal and written instruction through game format to discuss heart disease and the return to sexual intimacy. Provides group verbal and written material to discuss and apply goal setting through the application of the S.M.A.R.T. Method.   Other Matters of the Heart: - Provides group verbal, written materials and models to describe Heart Failure, Angina, Valve Disease, and Diabetes in the realm of heart disease. Includes description of the disease process and treatment options available to the cardiac patient.      Cardiac Rehab from 07/21/2015 in [facility] Cardiac Rehab   Date  06/18/15   Educator  DW   Instruction Review Code  2- meets goals/outcomes      Exercise & Equipment Safety: - Individual verbal instruction and demonstration of equipment use and safety with use of the equipment.      Cardiac Rehab from 07/21/2015 in [facility] Cardiac Rehab   Date  06/06/15   Educator  C. Mersadies Petree,Chase Cabrera      Infection Prevention: - Provides verbal and written material to individual with discussion of infection control including proper  hand washing and proper equipment cleaning during exercise session.      Cardiac Rehab from 07/21/2015 in [facility] Cardiac Rehab   Date  06/06/15   Educator  C. EnterkinRN      Falls Prevention: - Provides verbal and written material to individual with discussion of falls prevention and safety.      Cardiac Rehab from 07/21/2015 in [facility] Cardiac Rehab   Date  06/06/15   Educator  C. Odilia Damico,Chase Cabrera   Instruction Review Code  1- partially meets, needs review/practice      Diabetes: - Individual verbal and written instruction to review signs/symptoms of diabetes, desired ranges of glucose level fasting, after meals and with exercise. Advice that pre and post exercise glucose checks will be done for 3 sessions at entry of program.      Cardiac Rehab from 07/21/2015 in [facility] Cardiac Rehab   Date  06/06/15   Educator  [place]   Instruction Review Code  1- partially meets, needs review/practice       Knowledge Questionnaire Score:     Knowledge Questionnaire Score - 06/06/15 1504    Knowledge Questionnaire Score   Pre Score 20      Personal Goals and Risk Factors at Admission:     Personal Goals and Risk Factors at Admission - 06/06/15 1145    Personal Goals and Risk Factors on Admission    Weight Management Yes   Intervention Learn and follow the exercise and diet guidelines while in the program. Utilize the nutrition  and education classes to help gain knowledge of the diet and exercise expectations in the program   Increase Aerobic Exercise and Physical Activity Yes   Intervention While in program, learn and follow the exercise prescription taught. Start at a low level workload and increase workload after able to maintain previous level for 30 minutes. Increase time before increasing intensity.   Understand more about Heart/Pulmonary Disease. Yes   Intervention While in program utilize professionals for any questions, and attend the education sessions. Great websites to use are  www.americanheart.org or www.lung.org for reliable information.   Increase knowledge of respiratory medications and ability to use respiratory devices properly.  Yes   Diabetes Yes   Goal Blood glucose control identified by blood glucose values, HgbA1C. Participant verbalizes understanding of the signs/symptoms of hyper/hypo glycemia, proper foot care and importance of medication and nutrition plan for blood glucose control.   Intervention Provide nutrition & aerobic exercise along with prescribed medications to achieve blood glucose in normal ranges: Fasting 65-99 mg/dL   Hypertension Yes   Goal Participant will see blood pressure controlled within the values of 140/3m/Hg or within value directed by their physician.   Intervention Provide nutrition & aerobic exercise along with prescribed medications to achieve BP 140/90 or less.      Personal Goals and Risk Factors Review:      Goals and Risk Factor Review      06/18/15 1402 07/03/15 1703 07/04/15 1029 07/21/15 1816     Increase Aerobic Exercise and Physical Activity   Goals Progress/Improvement seen  Yes No No Yes    Comments Chase Cabrera likes the new Biostep Exercise piece of equipment and said it is easier for him to exercise on.  Chase Cabrera Chase Cabrera not complete his rehab session.  Chase Cabrera walked in at the beginning of Cardiac REhab and said here is my phone number and I want to take him to the Emerg. Dept to be checked out since he told me you had him quit exercising last evening in Cardiac Rehab when he had a lot of irregular beats after exercesing a while sitting on the Nustep. Today when he arrived and before any exercise when he was just sitting a chair for approx at least 5-10 minutes he started having a lot of PVC's every other beat. Blood pressure was stable. No c/o. Recently Lasix dose was doubled but he is on potassium also. [place] and report given to triage Chase Cabrera with copy of rhythm strip See Emerg  Dept note ([facility] from [facility] to be followed up by Chase Cabrera). Chase Cabrera has had stable heart rhythms lately.     Diabetes   Goal  Blood glucose control identified by blood glucose values, HgbA1C. Participant verbalizes understanding of the signs/symptoms of hyper/hypo glycemia, proper foot care and importance of medication and nutrition plan for blood glucose control.  Stable blood glucose readings in CR.   Blood glucose control identified by blood glucose values, HgbA1C. Participant verbalizes understanding of the signs/symptoms of hyper/hypo glycemia, proper foot care and importance of medication and nutrition plan for blood glucose control.  Stable blood sugars.     Hypertension   Goal  --  Orthostatic bp done by Chase Epley,Chase Cabrera last evening and was good. since Chase Cabrera c//o of a little bit if dizziness.   --  Chase Cabrera's blood pressures are good.        Personal Goals Discharge (Final Personal  Goals and Risk Factors Review):      Goals and Risk Factor Review - 07/21/15 1816    Increase Aerobic Exercise and Physical Activity   Goals Progress/Improvement seen  Yes   Comments Federick has had stable heart rhythms lately.    Diabetes   Goal Blood glucose control identified by blood glucose values, HgbA1C. Participant verbalizes understanding of the signs/symptoms of hyper/hypo glycemia, proper foot care and importance of medication and nutrition plan for blood glucose control.  Stable blood sugars.    Hypertension   Goal --  Christien's blood pressures are good.        Comments: Jarris has had stable heart rhythms lately. He does well on the Biostep exercise equipment.

## 2015-07-21 NOTE — Progress Notes (Signed)
Daily Session Note  Patient Details  Name: Chase Cabrera MRN: [id number] Date of Birth: [birth year] Referring Provider:  Concha Pyo, MD  Encounter Date: 07/21/2015  Check In:     Session Check In - 07/21/15 1622    Check-In   Staff Present Heath Lark RN, BSN, CCRP;Russie Gulledge RN, BSN;Steven Way BS, ACSM EP-C, Exercise Physiologist   ER physicians immediately available to respond to emergencies See telemetry face sheet for immediately available ER MD   Medication changes reported     No   Fall or balance concerns reported    No   Warm-up and Cool-down Performed on first and last piece of equipment   VAD Patient? No   Pain Assessment   Currently in Pain? No/denies         Goals Met:  Proper associated with RPD/PD & O2 Sat Exercise tolerated well No report of cardiac concerns or symptoms  Goals Unmet:  Not Applicable  Goals Comments:    Dr. Emily Filbert is Medical Director for [place] and [facility].

## 2015-07-23 ENCOUNTER — Other Ambulatory Visit: Payer: Self-pay | Admitting: *Deleted

## 2015-07-23 ENCOUNTER — Encounter: Payer: Medicare Other | Admitting: *Deleted

## 2015-07-23 DIAGNOSIS — Z9861 Coronary angioplasty status: Secondary | ICD-10-CM | POA: Diagnosis not present

## 2015-07-23 DIAGNOSIS — Z95820 Peripheral vascular angioplasty status with implants and grafts: Secondary | ICD-10-CM

## 2015-07-23 LAB — GLUCOSE, CAPILLARY: GLUCOSE-CAPILLARY: 178 mg/dL — AB (ref 65–99)

## 2015-07-23 NOTE — Progress Notes (Signed)
Daily Session Note  Patient Details  Name: Chase Cabrera MRN: [id number] Date of Birth: October 13, 1945 Referring Provider:  Concha Pyo, MD  Encounter Date: 07/23/2015  Check In:     Session Check In - 07/23/15 1709    Check-In   Staff Present Gerlene Burdock RN, BSN;Sevan Mcbroom Dillard Essex MS, ACSM CEP Exercise Physiologist;Diane Mariana Arn, BSN   ER physicians immediately available to respond to emergencies See telemetry face sheet for immediately available ER MD   Medication changes reported     No   Fall or balance concerns reported    No   Warm-up and Cool-down Performed on first and last piece of equipment   VAD Patient? No   Pain Assessment   Currently in Pain? No/denies   Multiple Pain Sites No           Exercise Prescription Changes - 07/23/15 1700    Exercise Review   Progression Yes   Response to Exercise   Symptoms Was experiencing dizziness on the BioStep towards the end of his 30 minutes. He stopped and rested and was given water. He did not complete the weights portion of the exercises and rested instead. The dizziness went away after several minutes of rest.    Duration Progress to 30 minutes of continuous aerobic without signs/symptoms of physical distress   Intensity Rest + 30   Progression Continue progressive overload as per policy without signs/symptoms or physical distress.   Resistance Training   Training Prescription Yes   Weight 2   Reps 10-15   Interval Training   Interval Training No   NuStep   Level 2   Watts 30   Minutes 20   Recumbant Elliptical   Level 4  BioStep   Watts 35   Minutes 25      Goals Met:  Independence with exercise equipment Exercise tolerated well Personal goals reviewed  Goals Unmet:  Some dizziness with exercise. Dizziness subsided after several minutes of rest and fluids.   Goals Comments:    Dr. Emily Filbert is Medical Director for [facility] and [facility].

## 2015-08-06 ENCOUNTER — Telehealth: Payer: Self-pay | Admitting: *Deleted

## 2015-08-06 ENCOUNTER — Encounter: Payer: Self-pay | Admitting: *Deleted

## 2015-08-06 NOTE — Progress Notes (Signed)
Cardiac Individual Treatment Plan  Patient Details  Name: Demontre Padin MRN: [id number] Date of Birth: Dec 12, 1945 Referring Provider:  Concha Pyo, MD  Initial Encounter Date:  06/05/2015  Visit Diagnosis: MI, CHF  Patient's Home Medications on Admission:  Current outpatient prescriptions:  .  amLODipine (NORVASC) 10 MG tablet, Take 1 tablet by mouth daily., Disp: , Rfl:  .  aspirin EC 81 MG tablet, Take 1 tablet by mouth daily., Disp: , Rfl:  .  calcium carbonate (OS-CAL - DOSED IN MG OF ELEMENTAL CALCIUM) 1250 (500 CA) MG tablet, Take 500 mg by mouth 2 (two) times daily., Disp: , Rfl:  .  carvedilol (COREG) 25 MG tablet, Take 25 mg by mouth 2 (two) times daily with a meal. , Disp: , Rfl: 1 .  clopidogrel (PLAVIX) 75 MG tablet, Take 75 mg by mouth daily., Disp: , Rfl:  .  colchicine 0.6 MG tablet, Take 1 tablet by mouth daily., Disp: , Rfl: 0 .  diclofenac sodium (VOLTAREN) 1 % GEL, Apply 2 g topically 4 (four) times daily., Disp: , Rfl:  .  diltiazem (CARDIZEM) 30 MG tablet, Take 30 mg by mouth daily as needed. For afib, Disp: , Rfl:  .  furosemide (LASIX) 20 MG tablet, Take 20-40 mg by mouth daily. 20 one day and 40 the next day. Alternate daily. Had 43m on 07-02-2015, Disp: , Rfl: 0 .  gabapentin (NEURONTIN) 300 MG capsule, Take 1 capsule by mouth 3 (three) times daily., Disp: , Rfl:  .  HUMALOG MIX 75/25 KWIKPEN (75-25) 100 UNIT/ML Kwikpen, Inject 30-44 Units into the skin 2 (two) times daily. 30 units every morning and 44 units in the evening., Disp: , Rfl: 0 .  hydrALAZINE (APRESOLINE) 100 MG tablet, Take 1 tablet by mouth 3 (three) times daily., Disp: , Rfl:  .  HYDROcodone-acetaminophen (NORCO/VICODIN) 5-325 MG per tablet, Take 1 tablet by mouth every 6 (six) hours as needed. pain, Disp: , Rfl: 0 .  isosorbide mononitrate (IMDUR) 120 MG 24 hr tablet, Take 1 tablet by mouth daily., Disp: , Rfl:  .  JANUVIA 100 MG tablet, Take 1 tablet by mouth daily., Disp: , Rfl:  .  loratadine  (CLARITIN) 10 MG tablet, Take 1 tablet by mouth daily before breakfast., Disp: , Rfl:  .  LYRICA 75 MG capsule, Take 75 mg by mouth 2 (two) times daily., Disp: , Rfl: 0 .  metFORMIN (GLUCOPHAGE) 1000 MG tablet, Take 1 tablet by mouth 2 (two) times daily., Disp: , Rfl: 0 .  Multiple Vitamin (MULTI-VITAMINS) TABS, Take 1 tablet by mouth daily., Disp: , Rfl:  .  nitroGLYCERIN (NITROSTAT) 0.4 MG SL tablet, Place 1 tablet under the tongue every 5 (five) minutes x 3 doses as needed. For chest pain.  Call 911 if no relief after 3 tablets, Disp: , Rfl:  .  omeprazole (PRILOSEC) 40 MG capsule, Take 1 capsule by mouth 2 (two) times daily., Disp: , Rfl:  .  potassium chloride (K-DUR,KLOR-CON) 10 MEQ tablet, Take 10 mEq by mouth 2 (two) times daily. , Disp: , Rfl:  .  pravastatin (PRAVACHOL) 40 MG tablet, Take 40 mg by mouth daily., Disp: , Rfl: 0 .  probenecid (BENEMID) 500 MG tablet, Take 1 tablet by mouth 2 (two) times daily., Disp: , Rfl: 0 .  tamsulosin (FLOMAX) 0.4 MG CAPS capsule, Take 1 capsule by mouth daily., Disp: , Rfl:  .  vitamin C (ASCORBIC ACID) 500 MG tablet, Take 1,000 mg by mouth daily., Disp: ,  Rfl:  .  warfarin (COUMADIN) 3 MG tablet, Take 4 mg by mouth daily. , Disp: , Rfl: 0  Past Medical History: Past Medical History  Diagnosis Date  . Myocardial infarction ([place])   . Arthritis   . Diabetes mellitus without complication ([place])   . Cancer ([place])   . Low blood potassium   . Hypertension   . CHF (congestive heart failure) ([facility])     Tobacco Use: History  Smoking status  . Never Smoker   Smokeless tobacco  . Never Used    Labs: Recent Review Flowsheet Data    There is no flowsheet data to display.       Exercise Target Goals:    Exercise Program Goal: Individual exercise prescription set with THRR, safety & activity barriers. Participant demonstrates ability to understand and report RPE using BORG scale, to self-measure pulse accurately, and to acknowledge the  importance of the exercise prescription.  Exercise Prescription Goal: Starting with aerobic activity 30 plus minutes a day, 3 days per week for initial exercise prescription. Provide home exercise prescription and guidelines that participant acknowledges understanding prior to discharge.  Activity Barriers & Risk Stratification:     Activity Barriers & Risk Stratification - 06/06/15 1146    Activity Barriers & Risk Stratification   Activity Barriers Arthritis;Joint Problems   Risk Stratification High      6 Minute Walk:     6 Minute Walk      06/05/15 1440       6 Minute Walk   Phase Initial     Distance 512 feet     Walk Time 4 minutes     Resting HR 54 bpm     Resting BP 122/52 mmHg     Max Ex. HR 86 bpm     Max Ex. BP 140/58 mmHg     RPE 11     Symptoms Yes (comment)     Comments SOB        Initial Exercise Prescription:     Initial Exercise Prescription - 06/05/15 1400    Date of Initial Exercise Prescription   Date 06/05/15   Treadmill   MPH 0.8   Grade 0   Minutes 10   Recumbant Bike   Level 1   RPM 40   Watts 20   Minutes 10   NuStep   Level 2   Watts 40   Minutes 10   Arm Ergometer   Level 1   Watts 10   Minutes 10   REL-XR   Level 1   Watts 40   Minutes 10   Prescription Details   Frequency (times per week) 3   Duration Progress to 30 minutes of continuous aerobic without signs/symptoms of physical distress   Intensity   THRR REST +  30   Ratings of Perceived Exertion 11-15   Perceived Dyspnea 2-4   Progression Continue progressive overload as per policy without signs/symptoms or physical distress.   Resistance Training   Training Prescription Yes   Weight 2   Reps 10-15      Exercise Prescription Changes:     Exercise Prescription Changes      06/18/15 1800 06/23/15 0700 07/14/15 1100 07/23/15 1700     Exercise Review   Progression  Yes Yes Yes    Response to Exercise   Blood Pressure (Admit)  122/60 mmHg 114/66 mmHg      Blood Pressure (Exercise)  132/78 mmHg 156/64 mmHg  Blood Pressure (Exit)  116/62 mmHg 114/58 mmHg     Heart Rate (Admit)  62 bpm 60 bpm     Heart Rate (Exercise)  63 bpm 69 bpm     Heart Rate (Exit)  59 bpm 83 bpm     Rating of Perceived Exertion (Exercise)  12 13     Symptoms  None Went to ER on 10/20 due to bigeminy heart rhythm. HE was cleared by MD to return and hasn't had any issues since. Was experiencing dizziness on the BioStep towards the end of his 30 minutes. He stopped and rested and was given water. He did not complete the weights portion of the exercises and rested instead. The dizziness went away after several minutes of rest.     Comments Third day of exercise. Patient was oriented to the gym and the equipment functions and settings. Procedures and policies of the gym were outlined and explained. The patient's individual exercise prescription and treatment plan were reviewed with them. All starting workloads were established based on the results of the functional testing  done at the initial intake visit. The plan for exercise progression was also introduced and progression will be customized based on the patient's performance and goals.  Reviewed individualized exercise prescription and made increases per departmental policy. Exercise increases were discussed with the patient and they were able to perform the new work loads without issue (no signs or symptoms).  Made increases to the level on the BioStep. Tareek had dropped his level after his episode of bigeminy and we have now increased it back to Level 4. Jasmond completed this with no issue.      Duration Progress to 30 minutes of continuous aerobic without signs/symptoms of physical distress Progress to 30 minutes of continuous aerobic without signs/symptoms of physical distress Progress to 30 minutes of continuous aerobic without signs/symptoms of physical distress Progress to 30 minutes of continuous aerobic without  signs/symptoms of physical distress    Intensity Rest + 30 Rest + 30 Rest + 30 Rest + 30    Progression Continue progressive overload as per policy without signs/symptoms or physical distress. Continue progressive overload as per policy without signs/symptoms or physical distress. Continue progressive overload as per policy without signs/symptoms or physical distress. Continue progressive overload as per policy without signs/symptoms or physical distress.    Resistance Training   Training Prescription Yes Yes Yes Yes    Weight 2 2 2 2     Reps 10-15 10-15 10-15 10-15    Interval Training   Interval Training  No No No    NuStep   Level  2 2 2     Watts  30 30 30     Minutes  20 20 20     Recumbant Elliptical   Level  4  BioStep 4  BioStep 4  BioStep    Watts  30 35 35    Minutes  25 25 25        Discharge Exercise Prescription (Final Exercise Prescription Changes):     Exercise Prescription Changes - 07/23/15 1700    Exercise Review   Progression Yes   Response to Exercise   Symptoms Was experiencing dizziness on the BioStep towards the end of his 30 minutes. He stopped and rested and was given water. He did not complete the weights portion of the exercises and rested instead. The dizziness went away after several minutes of rest.    Duration Progress to 30 minutes of continuous aerobic without signs/symptoms of  physical distress   Intensity Rest + 30   Progression Continue progressive overload as per policy without signs/symptoms or physical distress.   Resistance Training   Training Prescription Yes   Weight 2   Reps 10-15   Interval Training   Interval Training No   NuStep   Level 2   Watts 30   Minutes 20   Recumbant Elliptical   Level 4  BioStep   Watts 35   Minutes 25      Nutrition:  Target Goals: Understanding of nutrition guidelines, daily intake of sodium <1548m, cholesterol <2028m calories 30% from fat and 7% or less from saturated fats, daily to have 5 or  more servings of fruits and vegetables.  Biometrics:     Pre Biometrics - 06/05/15 1443    Pre Biometrics   Height 5' 6.5" (1.689 m)   Weight 190 lb 6.4 oz (86.365 kg)   Waist Circumference 43.5 inches   Hip Circumference 47 inches   Waist to Hip Ratio 0.93 %   BMI (Calculated) 30.3       Nutrition Therapy Plan and Nutrition Goals:     Nutrition Therapy & Goals - 06/06/15 1512    Nutrition Therapy   Drug/Food Interactions Statins/Certain Fruits;Coumadin/Vit K   Intervention Plan   Intervention Using nutrition plan and personal goals to gain a healthy nutrition lifestyle. Add exercise as prescribed.      Nutrition Discharge: Rate Your Plate Scores:   Nutrition Goals Re-Evaluation:     Nutrition Goals Re-Evaluation      06/18/15 1359 07/21/15 1816         Personal Goal #1 Re-Evaluation   Personal Goal #1 WAs in Cardiac Rehab 3 years ago cont to try to eat healthy.        Goal Progress Seen Yes Yes      Comments  Brayn and his wife are very knowledgable about salt, sodium saturated fats etc what not to eat.          Psychosocial: Target Goals: Acknowledge presence or absence of depression, maximize coping skills, provide positive support system. Participant is able to verbalize types and ability to use techniques and skills needed for reducing stress and depression.  Initial Review & Psychosocial Screening:     Initial Psych Review & Screening - 06/06/15 15SoldierYes   Barriers   Psychosocial barriers to participate in program There are no identifiable barriers or psychosocial needs.      Quality of Life Scores:     Quality of Life - 06/06/15 1144    Quality of Life Scores   Health/Function Pre 22.17 %   Socioeconomic Pre 25.21 %   Psych/Spiritual Pre 28.21 %   Family Pre 28 %   GLOBAL Pre 24.9 %      PHQ-9:     Recent Review Flowsheet Data    [facility]   Decreased Interest 0    Down, Depressed, Hopeless 0   PHQ - 2 Score 0   Altered sleeping 2   Tired, decreased energy 0   Change in appetite 0   Feeling bad or failure about yourself  0   Trouble concentrating 0   Moving slowly or fidgety/restless 0   Suicidal thoughts 0   PHQ-9 Score 2   Difficult doing work/chores Not difficult at all      Psychosocial Evaluation and Intervention:   Psychosocial Re-Evaluation:  Psychosocial Re-Evaluation      06/18/15 1400 07/03/15 1707 07/21/15 1817       Psychosocial Re-Evaluation   Interventions Encouraged to attend Cardiac Rehabilitation for the exercise Stress management education Encouraged to attend Cardiac Rehabilitation for the exercise     Comments Cobey said he knew he needed to exerise more and things just kept coming up.  Kycen was taken via wheelchair to [employer] [facility]. He is tired of going to the MD.  Delfino Lovett has some stress in his life with 7 stents and his wife is having ankle problems which is bothering her exercise and walking.         Vocational Rehabilitation: Provide vocational rehab assistance to qualifying candidates.   Vocational Rehab Evaluation & Intervention:     Vocational Rehab - 06/06/15 1510    Initial Vocational Rehab Evaluation & Intervention   Assessment shows need for Vocational Rehabilitation No      Education: Education Goals: Education classes will be provided on a weekly basis, covering required topics. Participant will state understanding/return demonstration of topics presented.  Learning Barriers/Preferences:     Learning Barriers/Preferences - 06/06/15 1510    Learning Barriers/Preferences   Learning Barriers None   Learning Preferences Group Instruction      Education Topics: General Nutrition Guidelines/Fats and Fiber: -Group instruction provided by verbal, written material, models and posters to present the general guidelines for heart healthy nutrition. Gives an explanation and review of dietary  fats and fiber.          Cardiac Rehab from 07/21/2015 in [facility] Cardiac Rehab   Date  06/23/15   Educator  PI   Instruction Review Code  2- meets goals/outcomes      Controlling Sodium/Reading Food Labels: -Group verbal and written material supporting the discussion of sodium use in heart healthy nutrition. Review and explanation with models, verbal and written materials for utilization of the food label.   Exercise Physiology & Risk Factors: - Group verbal and written instruction with models to review the exercise physiology of the cardiovascular system and associated critical values. Details cardiovascular disease risk factors and the goals associated with each risk factor.   Aerobic Exercise & Resistance Training: - Gives group verbal and written discussion on the health impact of inactivity. On the components of aerobic and resistive training programs and the benefits of this training and how to safely progress through these programs.      Cardiac Rehab from 07/21/2015 in [facility] Cardiac Rehab   Date  07/21/15   Educator  [address]   Instruction Review Code  2- meets goals/outcomes      Flexibility, Balance, General Exercise Guidelines: - Provides group verbal and written instruction on the benefits of flexibility and balance training programs. Provides general exercise guidelines with specific guidelines to those with heart or lung disease. Demonstration and skill practice provided.   Stress Management: - Provides group verbal and written instruction about the health risks of elevated stress, cause of high stress, and healthy ways to reduce stress.   Depression: - Provides group verbal and written instruction on the correlation between heart/lung disease and depressed mood, treatment options, and the stigmas associated with seeking treatment.      Cardiac Rehab from 07/21/2015 in [facility] Cardiac Rehab   Date  07/09/15   Educator  Kathreen Cornfield   Instruction Review Code  2- meets  goals/outcomes      Anatomy & Physiology of the Heart: - Group verbal and written instruction and models  provide basic cardiac anatomy and physiology, with the coronary electrical and arterial systems. Review of: AMI, Angina, Valve disease, Heart Failure, Cardiac Arrhythmia, Pacemakers, and the ICD.   Cardiac Procedures: - Group verbal and written instruction and models to describe the testing methods done to diagnose heart disease. Reviews the outcomes of the test results. Describes the treatment choices: Medical Management, Angioplasty, or Coronary Bypass Surgery.   Cardiac Medications: - Group verbal and written instruction to review commonly prescribed medications for heart disease. Reviews the medication, class of the drug, and side effects. Includes the steps to properly store meds and maintain the prescription regimen.      Cardiac Rehab from 07/21/2015 in [facility] Cardiac Rehab   Date  07/14/15   Educator  SB   Instruction Review Code  2- meets goals/outcomes      Go Sex-Intimacy & Heart Disease, Get SMART - Goal Setting: - Group verbal and written instruction through game format to discuss heart disease and the return to sexual intimacy. Provides group verbal and written material to discuss and apply goal setting through the application of the S.M.A.R.T. Method.   Other Matters of the Heart: - Provides group verbal, written materials and models to describe Heart Failure, Angina, Valve Disease, and Diabetes in the realm of heart disease. Includes description of the disease process and treatment options available to the cardiac patient.      Cardiac Rehab from 07/21/2015 in [facility] Cardiac Rehab   Date  06/18/15   Educator  DW   Instruction Review Code  2- meets goals/outcomes      Exercise & Equipment Safety: - Individual verbal instruction and demonstration of equipment use and safety with use of the equipment.      Cardiac Rehab from 07/21/2015 in [facility] Cardiac Rehab   Date   06/06/15   Educator  C. Aqil Goetting,RN      Infection Prevention: - Provides verbal and written material to individual with discussion of infection control including proper hand washing and proper equipment cleaning during exercise session.      Cardiac Rehab from 07/21/2015 in [facility] Cardiac Rehab   Date  06/06/15   Educator  C. EnterkinRN      Falls Prevention: - Provides verbal and written material to individual with discussion of falls prevention and safety.      Cardiac Rehab from 07/21/2015 in [facility] Cardiac Rehab   Date  06/06/15   Educator  C. Babetta Paterson,RN   Instruction Review Code  1- partially meets, needs review/practice      Diabetes: - Individual verbal and written instruction to review signs/symptoms of diabetes, desired ranges of glucose level fasting, after meals and with exercise. Advice that pre and post exercise glucose checks will be done for 3 sessions at entry of program.      Cardiac Rehab from 07/21/2015 in [facility] Cardiac Rehab   Date  06/06/15   Educator  [place]   Instruction Review Code  1- partially meets, needs review/practice       Knowledge Questionnaire Score:     Knowledge Questionnaire Score - 06/06/15 1504    Knowledge Questionnaire Score   Pre Score 20      Personal Goals and Risk Factors at Admission:     Personal Goals and Risk Factors at Admission - 06/06/15 1145    Personal Goals and Risk Factors on Admission    Weight Management Yes   Intervention Learn and follow the exercise and diet guidelines while in the program. Utilize the nutrition  and education classes to help gain knowledge of the diet and exercise expectations in the program   Increase Aerobic Exercise and Physical Activity Yes   Intervention While in program, learn and follow the exercise prescription taught. Start at a low level workload and increase workload after able to maintain previous level for 30 minutes. Increase time before increasing intensity.   Understand more  about Heart/Pulmonary Disease. Yes   Intervention While in program utilize professionals for any questions, and attend the education sessions. Great websites to use are www.americanheart.org or www.lung.org for reliable information.   Increase knowledge of respiratory medications and ability to use respiratory devices properly.  Yes   Diabetes Yes   Goal Blood glucose control identified by blood glucose values, HgbA1C. Participant verbalizes understanding of the signs/symptoms of hyper/hypo glycemia, proper foot care and importance of medication and nutrition plan for blood glucose control.   Intervention Provide nutrition & aerobic exercise along with prescribed medications to achieve blood glucose in normal ranges: Fasting 65-99 mg/dL   Hypertension Yes   Goal Participant will see blood pressure controlled within the values of 140/17m/Hg or within value directed by their physician.   Intervention Provide nutrition & aerobic exercise along with prescribed medications to achieve BP 140/90 or less.      Personal Goals and Risk Factors Review:      Goals and Risk Factor Review      06/18/15 1402 07/03/15 1703 07/04/15 1029 07/21/15 1816 08/06/15 1500   Increase Aerobic Exercise and Physical Activity   Goals Progress/Improvement seen  Yes No No Yes No   Comments Robyn likes the new Biostep Exercise piece of equipment and said it is easier for him to exercise on.  Shabazz TEppsdid not complete his rehab session.  Mrs. Jamar walked in at the beginning of Cardiac REhab and said here is my phone number and I want to take him to the Emerg. Dept to be checked out since he told me you had him quit exercising last evening in Cardiac Rehab when he had a lot of irregular beats after exercesing a while sitting on the Nustep. Today when he arrived and before any exercise when he was just sitting a chair for approx at least 5-10 minutes he started having a lot of PVC's every other beat. Blood pressure was  stable. No c/o. Recently Lasix dose was doubled but he is on potassium also. [place] and report given to triage RN with copy of rhythm strip See Emerg Dept note (discharged from EGonzale[facility] several hours to be [facility] Terelle has had stable heart rhythms lately.  I called Amjad and he has been unable to attend [facility] due to "breathing problems and even had a breathing test". Pulse ox on room air last time he was here was 96%. Will cont. to assess.    Diabetes   Goal  Blood glucose control identified by blood glucose values, HgbA1C. Participant verbalizes understanding of the signs/symptoms of hyper/hypo glycemia, proper foot care and importance of medication and nutrition plan for blood glucose control.  Stable blood glucose readings in CR.   Blood glucose control identified by blood glucose values, HgbA1C. Participant verbalizes understanding of the signs/symptoms of hyper/hypo glycemia, proper foot care and importance of medication and nutrition plan for blood glucose control.  Stable blood sugars.     Hypertension   Goal  --  Orthostatic bp done by  Roanna Epley ,RN last evening and was good. since Ledarrius c//o of a little bit if dizziness.   --  Demarqus's blood pressures are good.        Personal Goals Discharge (Final Personal Goals and Risk Factors Review):      Goals and Risk Factor Review - 08/06/15 1500    Increase Aerobic Exercise and Physical Activity   Goals Progress/Improvement seen  No   Comments I called Daxx and he has been unable to attend [facility] due to "breathing problems and even had a breathing test". Pulse ox on room air last time he was here was 96%. Will cont. to assess.       ITP Comments:   Comments: I called Miciah and he has been unable to attend [facility] due to "breathing problems and even had a breathing test". Pulse ox on room air last time he was here was 96%.  Will cont. to assess.

## 2015-08-06 NOTE — Telephone Encounter (Signed)
I called Chase Cabrera and he has been unable to attend [facility] due to "breathing problems and even had a breathing test". Pulse ox on room air last time he was here was 96%. Will cont. to assess.

## 2015-08-14 ENCOUNTER — Encounter: Payer: Medicare Other | Attending: Internal Medicine

## 2015-08-14 DIAGNOSIS — Z9889 Other specified postprocedural states: Secondary | ICD-10-CM | POA: Insufficient documentation

## 2015-08-19 ENCOUNTER — Telehealth: Payer: Self-pay | Admitting: *Deleted

## 2015-08-19 NOTE — Progress Notes (Signed)
Cardiac Individual Treatment Plan  Patient Details  Name: Chase Cabrera MRN: [id number] Date of Birth: 1945/11/25 Referring Provider:  Concha Pyo, MD  Initial Encounter Date:    Visit Diagnosis: S/P angioplasty with stent  Patient's Home Medications on Admission:  Current outpatient prescriptions:  .  amLODipine (NORVASC) 10 MG tablet, Take 1 tablet by mouth daily., Disp: , Rfl:  .  aspirin EC 81 MG tablet, Take 1 tablet by mouth daily., Disp: , Rfl:  .  calcium carbonate (OS-CAL - DOSED IN MG OF ELEMENTAL CALCIUM) 1250 (500 CA) MG tablet, Take 500 mg by mouth 2 (two) times daily., Disp: , Rfl:  .  carvedilol (COREG) 25 MG tablet, Take 25 mg by mouth 2 (two) times daily with a meal. , Disp: , Rfl: 1 .  clopidogrel (PLAVIX) 75 MG tablet, Take 75 mg by mouth daily., Disp: , Rfl:  .  colchicine 0.6 MG tablet, Take 1 tablet by mouth daily., Disp: , Rfl: 0 .  diclofenac sodium (VOLTAREN) 1 % GEL, Apply 2 g topically 4 (four) times daily., Disp: , Rfl:  .  diltiazem (CARDIZEM) 30 MG tablet, Take 30 mg by mouth daily as needed. For afib, Disp: , Rfl:  .  furosemide (LASIX) 20 MG tablet, Take 20-40 mg by mouth daily. 20 one day and 40 the next day. Alternate daily. Had 4m on 07-02-2015, Disp: , Rfl: 0 .  gabapentin (NEURONTIN) 300 MG capsule, Take 1 capsule by mouth 3 (three) times daily., Disp: , Rfl:  .  HUMALOG MIX 75/25 KWIKPEN (75-25) 100 UNIT/ML Kwikpen, Inject 30-44 Units into the skin 2 (two) times daily. 30 units every morning and 44 units in the evening., Disp: , Rfl: 0 .  hydrALAZINE (APRESOLINE) 100 MG tablet, Take 1 tablet by mouth 3 (three) times daily., Disp: , Rfl:  .  HYDROcodone-acetaminophen (NORCO/VICODIN) 5-325 MG per tablet, Take 1 tablet by mouth every 6 (six) hours as needed. pain, Disp: , Rfl: 0 .  isosorbide mononitrate (IMDUR) 120 MG 24 hr tablet, Take 1 tablet by mouth daily., Disp: , Rfl:  .  JANUVIA 100 MG tablet, Take 1 tablet by mouth daily., Disp: , Rfl:  .   loratadine (CLARITIN) 10 MG tablet, Take 1 tablet by mouth daily before breakfast., Disp: , Rfl:  .  LYRICA 75 MG capsule, Take 75 mg by mouth 2 (two) times daily., Disp: , Rfl: 0 .  metFORMIN (GLUCOPHAGE) 1000 MG tablet, Take 1 tablet by mouth 2 (two) times daily., Disp: , Rfl: 0 .  Multiple Vitamin (MULTI-VITAMINS) TABS, Take 1 tablet by mouth daily., Disp: , Rfl:  .  nitroGLYCERIN (NITROSTAT) 0.4 MG SL tablet, Place 1 tablet under the tongue every 5 (five) minutes x 3 doses as needed. For chest pain.  Call 911 if no relief after 3 tablets, Disp: , Rfl:  .  omeprazole (PRILOSEC) 40 MG capsule, Take 1 capsule by mouth 2 (two) times daily., Disp: , Rfl:  .  potassium chloride (K-DUR,KLOR-CON) 10 MEQ tablet, Take 10 mEq by mouth 2 (two) times daily. , Disp: , Rfl:  .  pravastatin (PRAVACHOL) 40 MG tablet, Take 40 mg by mouth daily., Disp: , Rfl: 0 .  probenecid (BENEMID) 500 MG tablet, Take 1 tablet by mouth 2 (two) times daily., Disp: , Rfl: 0 .  tamsulosin (FLOMAX) 0.4 MG CAPS capsule, Take 1 capsule by mouth daily., Disp: , Rfl:  .  vitamin C (ASCORBIC ACID) 500 MG tablet, Take 1,000 mg by mouth daily.,  Disp: , Rfl:  .  warfarin (COUMADIN) 3 MG tablet, Take 4 mg by mouth daily. , Disp: , Rfl: 0  Past Medical History: Past Medical History  Diagnosis Date  . Myocardial infarction ([place])   . Arthritis   . Diabetes mellitus without complication ([place])   . Cancer ([place])   . Low blood potassium   . Hypertension   . CHF (congestive heart failure) ([facility])     Tobacco Use: History  Smoking status  . Never Smoker   Smokeless tobacco  . Never Used    Labs: Recent Review Flowsheet Data    There is no flowsheet data to display.       Exercise Target Goals:    Exercise Program Goal: Individual exercise prescription set with THRR, safety & activity barriers. Participant demonstrates ability to understand and report RPE using BORG scale, to self-measure pulse accurately, and to acknowledge  the importance of the exercise prescription.  Exercise Prescription Goal: Starting with aerobic activity 30 plus minutes a day, 3 days per week for initial exercise prescription. Provide home exercise prescription and guidelines that participant acknowledges understanding prior to discharge.  Activity Barriers & Risk Stratification:     Activity Barriers & Risk Stratification - 06/06/15 1146    Activity Barriers & Risk Stratification   Activity Barriers Arthritis;Joint Problems   Risk Stratification High      6 Minute Walk:     6 Minute Walk      06/05/15 1440       6 Minute Walk   Phase Initial     Distance 512 feet     Walk Time 4 minutes     Resting HR 54 bpm     Resting BP 122/52 mmHg     Max Ex. HR 86 bpm     Max Ex. BP 140/58 mmHg     RPE 11     Symptoms Yes (comment)     Comments SOB        Initial Exercise Prescription:     Initial Exercise Prescription - 06/05/15 1400    Date of Initial Exercise Prescription   Date 06/05/15   Treadmill   MPH 0.8   Grade 0   Minutes 10   Recumbant Bike   Level 1   RPM 40   Watts 20   Minutes 10   NuStep   Level 2   Watts 40   Minutes 10   Arm Ergometer   Level 1   Watts 10   Minutes 10   REL-XR   Level 1   Watts 40   Minutes 10   Prescription Details   Frequency (times per week) 3   Duration Progress to 30 minutes of continuous aerobic without signs/symptoms of physical distress   Intensity   THRR REST +  30   Ratings of Perceived Exertion 11-15   Perceived Dyspnea 2-4   Progression Continue progressive overload as per policy without signs/symptoms or physical distress.   Resistance Training   Training Prescription Yes   Weight 2   Reps 10-15      Exercise Prescription Changes:     Exercise Prescription Changes      06/18/15 1800 06/23/15 0700 07/14/15 1100 07/23/15 1700     Exercise Review   Progression  Yes Yes Yes    Response to Exercise   Blood Pressure (Admit)  122/60 mmHg 114/66 mmHg  144/70 mmHg    Blood Pressure (Exercise)  132/78 mmHg 156/64 mmHg 144/62  mmHg    Blood Pressure (Exit)  116/62 mmHg 114/58 mmHg 118/80 mmHg    Heart Rate (Admit)  62 bpm 60 bpm 63 bpm    Heart Rate (Exercise)  63 bpm 69 bpm 87 bpm    Heart Rate (Exit)  59 bpm 83 bpm 62 bpm    Rating of Perceived Exertion (Exercise)  12 13 13     Symptoms  None Went to ER on 10/20 due to bigeminy heart rhythm. HE was cleared by MD to return and hasn't had any issues since. Was experiencing dizziness on the BioStep towards the end of his 30 minutes. He stopped and rested and was given water. He did not complete the weights portion of the exercises and rested instead. The dizziness went away after several minutes of rest.     Comments Third day of exercise. Patient was oriented to the gym and the equipment functions and settings. Procedures and policies of the gym were outlined and explained. The patient's individual exercise prescription and treatment plan were reviewed with them. All starting workloads were established based on the results of the functional testing  done at the initial intake visit. The plan for exercise progression was also introduced and progression will be customized based on the patient's performance and goals.  Reviewed individualized exercise prescription and made increases per departmental policy. Exercise increases were discussed with the patient and they were able to perform the new work loads without issue (no signs or symptoms).  Made increases to the level on the BioStep. Darrold had dropped his level after his episode of bigeminy and we have now increased it back to Level 4. Ganesh completed this with no issue.  Making only small increases due to reoccurring symptoms when exercising in rehab.    Duration Progress to 30 minutes of continuous aerobic without signs/symptoms of physical distress Progress to 30 minutes of continuous aerobic without signs/symptoms of physical distress Progress to 30  minutes of continuous aerobic without signs/symptoms of physical distress Progress to 50 minutes of aerobic without signs/symptoms of physical distress    Intensity Rest + 30 Rest + 30 Rest + 30 Rest + 30    Progression Continue progressive overload as per policy without signs/symptoms or physical distress. Continue progressive overload as per policy without signs/symptoms or physical distress. Continue progressive overload as per policy without signs/symptoms or physical distress. Continue progressive overload as per policy without signs/symptoms or physical distress.    Resistance Training   Training Prescription Yes Yes Yes Yes    Weight 2 2 2 3     Reps 10-15 10-15 10-15 10-15    Interval Training   Interval Training  No No No    NuStep   Level  2 2 2     Watts  30 30 30     Minutes  20 20 20     Recumbant Elliptical   Level  4  BioStep 4  BioStep 5  BioStep    Watts  30 35 40    Minutes  25 25 25        Discharge Exercise Prescription (Final Exercise Prescription Changes):     Exercise Prescription Changes - 07/23/15 1700    Exercise Review   Progression Yes   Response to Exercise   Blood Pressure (Admit) 144/70 mmHg   Blood Pressure (Exercise) 144/62 mmHg   Blood Pressure (Exit) 118/80 mmHg   Heart Rate (Admit) 63 bpm   Heart Rate (Exercise) 87 bpm   Heart Rate (Exit) 62 bpm  Rating of Perceived Exertion (Exercise) 13   Symptoms Was experiencing dizziness on the BioStep towards the end of his 30 minutes. He stopped and rested and was given water. He did not complete the weights portion of the exercises and rested instead. The dizziness went away after several minutes of rest.    Comments Making only small increases due to reoccurring symptoms when exercising in rehab.   Duration Progress to 50 minutes of aerobic without signs/symptoms of physical distress   Intensity Rest + 30   Progression Continue progressive overload as per policy without signs/symptoms or physical  distress.   Resistance Training   Training Prescription Yes   Weight 3   Reps 10-15   Interval Training   Interval Training No   NuStep   Level 2   Watts 30   Minutes 20   Recumbant Elliptical   Level 5  BioStep   Watts 40   Minutes 25      Nutrition:  Target Goals: Understanding of nutrition guidelines, daily intake of sodium <1548m, cholesterol <2030m calories 30% from fat and 7% or less from saturated fats, daily to have 5 or more servings of fruits and vegetables.  Biometrics:     Pre Biometrics - 06/05/15 1443    Pre Biometrics   Height 5' 6.5" (1.689 m)   Weight 190 lb 6.4 oz (86.365 kg)   Waist Circumference 43.5 inches   Hip Circumference 47 inches   Waist to Hip Ratio 0.93 %   BMI (Calculated) 30.3       Nutrition Therapy Plan and Nutrition Goals:     Nutrition Therapy & Goals - 06/06/15 1512    Nutrition Therapy   Drug/Food Interactions Statins/Certain Fruits;Coumadin/Vit K   Intervention Plan   Intervention Using nutrition plan and personal goals to gain a healthy nutrition lifestyle. Add exercise as prescribed.      Nutrition Discharge: Rate Your Plate Scores:   Nutrition Goals Re-Evaluation:     Nutrition Goals Re-Evaluation      06/18/15 1359 07/21/15 1816         Personal Goal #1 Re-Evaluation   Personal Goal #1 WAs in Cardiac Rehab 3 years ago cont to try to eat healthy.        Goal Progress Seen Yes Yes      Comments  Warwick and his wife are very knowledgable about salt, sodium saturated fats etc what not to eat.          Psychosocial: Target Goals: Acknowledge presence or absence of depression, maximize coping skills, provide positive support system. Participant is able to verbalize types and ability to use techniques and skills needed for reducing stress and depression.  Initial Review & Psychosocial Screening:     Initial Psych Review & Screening - 06/06/15 [place]   Barriers    Psychosocial barriers to participate in program There are no identifiable barriers or psychosocial needs.      Quality of Life Scores:     Quality of Life - 06/06/15 1144    Quality of Life Scores   Health/Function Pre 22.17 %   Socioeconomic Pre 25.21 %   Psych/Spiritual Pre 28.21 %   Family Pre 28 %   GLOBAL Pre 24.9 %      PHQ-9:     Recent Review Flowsheet Data    [facility]   Decreased Interest 0   Down, Depressed, Hopeless 0  PHQ - 2 Score 0   Altered sleeping 2   Tired, decreased energy 0   Change in appetite 0   Feeling bad or failure about yourself  0   Trouble concentrating 0   Moving slowly or fidgety/restless 0   Suicidal thoughts 0   PHQ-9 Score 2   Difficult doing work/chores Not difficult at all      Psychosocial Evaluation and Intervention:   Psychosocial Re-Evaluation:     Psychosocial Re-Evaluation      06/18/15 1400 07/03/15 1707 07/21/15 1817       Psychosocial Re-Evaluation   Interventions Encouraged to attend Cardiac Rehabilitation for the exercise Stress management education Encouraged to attend Cardiac Rehabilitation for the exercise     Comments Rjay said he knew he needed to exerise more and things just kept coming up.  Clara was taken via wheelchair to [employer]. He is tired of going to the MD.  Delfino Lovett has some stress in his life with 7 stents and his wife is having ankle problems which is bothering her exercise and walking.         Vocational Rehabilitation: Provide vocational rehab assistance to qualifying candidates.   Vocational Rehab Evaluation & Intervention:     Vocational Rehab - 06/06/15 1510    Initial Vocational Rehab Evaluation & Intervention   Assessment shows need for Vocational Rehabilitation No      Education: Education Goals: Education classes will be provided on a weekly basis, covering required topics. Participant will state understanding/return demonstration of topics  presented.  Learning Barriers/Preferences:     Learning Barriers/Preferences - 06/06/15 1510    Learning Barriers/Preferences   Learning Barriers None   Learning Preferences Group Instruction      Education Topics: General Nutrition Guidelines/Fats and Fiber: -Group instruction provided by verbal, written material, models and posters to present the general guidelines for heart healthy nutrition. Gives an explanation and review of dietary fats and fiber.          Cardiac Rehab from 07/21/2015 in [facility] Cardiac Rehab   Date  06/23/15   Educator  PI   Instruction Review Code  2- meets goals/outcomes      Controlling Sodium/Reading Food Labels: -Group verbal and written material supporting the discussion of sodium use in heart healthy nutrition. Review and explanation with models, verbal and written materials for utilization of the food label.   Exercise Physiology & Risk Factors: - Group verbal and written instruction with models to review the exercise physiology of the cardiovascular system and associated critical values. Details cardiovascular disease risk factors and the goals associated with each risk factor.   Aerobic Exercise & Resistance Training: - Gives group verbal and written discussion on the health impact of inactivity. On the components of aerobic and resistive training programs and the benefits of this training and how to safely progress through these programs.      Cardiac Rehab from 07/21/2015 in [facility] Cardiac Rehab   Date  07/21/15   Educator  S. [address]   Instruction Review Code  2- meets goals/outcomes      Flexibility, Balance, General Exercise Guidelines: - Provides group verbal and written instruction on the benefits of flexibility and balance training programs. Provides general exercise guidelines with specific guidelines to those with heart or lung disease. Demonstration and skill practice provided.   Stress Management: - Provides group verbal and written  instruction about the health risks of elevated stress, cause of high stress, and healthy ways to reduce stress.  Depression: - Provides group verbal and written instruction on the correlation between heart/lung disease and depressed mood, treatment options, and the stigmas associated with seeking treatment.      Cardiac Rehab from 07/21/2015 in [facility] Cardiac Rehab   Date  07/09/15   Educator  Kathreen Cornfield   Instruction Review Code  2- meets goals/outcomes      Anatomy & Physiology of the Heart: - Group verbal and written instruction and models provide basic cardiac anatomy and physiology, with the coronary electrical and arterial systems. Review of: AMI, Angina, Valve disease, Heart Failure, Cardiac Arrhythmia, Pacemakers, and the ICD.   Cardiac Procedures: - Group verbal and written instruction and models to describe the testing methods done to diagnose heart disease. Reviews the outcomes of the test results. Describes the treatment choices: Medical Management, Angioplasty, or Coronary Bypass Surgery.   Cardiac Medications: - Group verbal and written instruction to review commonly prescribed medications for heart disease. Reviews the medication, class of the drug, and side effects. Includes the steps to properly store meds and maintain the prescription regimen.      Cardiac Rehab from 07/21/2015 in [facility] Cardiac Rehab   Date  07/14/15   Educator  SB   Instruction Review Code  2- meets goals/outcomes      Go Sex-Intimacy & Heart Disease, Get SMART - Goal Setting: - Group verbal and written instruction through game format to discuss heart disease and the return to sexual intimacy. Provides group verbal and written material to discuss and apply goal setting through the application of the S.M.A.R.T. Method.   Other Matters of the Heart: - Provides group verbal, written materials and models to describe Heart Failure, Angina, Valve Disease, and Diabetes in the realm of heart disease. Includes  description of the disease process and treatment options available to the cardiac patient.      Cardiac Rehab from 07/21/2015 in [facility] Cardiac Rehab   Date  06/18/15   Educator  DW   Instruction Review Code  2- meets goals/outcomes      Exercise & Equipment Safety: - Individual verbal instruction and demonstration of equipment use and safety with use of the equipment.      Cardiac Rehab from 07/21/2015 in [facility] Cardiac Rehab   Date  06/06/15   Educator  C. Enterkin,RN      Infection Prevention: - Provides verbal and written material to individual with discussion of infection control including proper hand washing and proper equipment cleaning during exercise session.      Cardiac Rehab from 07/21/2015 in [facility] Cardiac Rehab   Date  06/06/15   Educator  C. EnterkinRN      Falls Prevention: - Provides verbal and written material to individual with discussion of falls prevention and safety.      Cardiac Rehab from 07/21/2015 in [facility] Cardiac Rehab   Date  06/06/15   Educator  C. Enterkin,RN   Instruction Review Code  1- partially meets, needs review/practice      Diabetes: - Individual verbal and written instruction to review signs/symptoms of diabetes, desired ranges of glucose level fasting, after meals and with exercise. Advice that pre and post exercise glucose checks will be done for 3 sessions at entry of program.      Cardiac Rehab from 07/21/2015 in [facility] Cardiac Rehab   Date  06/06/15   Educator  [place]   Instruction Review Code  1- partially meets, needs review/practice       Knowledge Questionnaire Score:  Knowledge Questionnaire Score - 06/06/15 1504    Knowledge Questionnaire Score   Pre Score 20      Personal Goals and Risk Factors at Admission:     Personal Goals and Risk Factors at Admission - 06/06/15 1145    Personal Goals and Risk Factors on Admission    Weight Management Yes   Intervention Learn and follow the exercise and diet guidelines while  in the program. Utilize the nutrition and education classes to help gain knowledge of the diet and exercise expectations in the program   Increase Aerobic Exercise and Physical Activity Yes   Intervention While in program, learn and follow the exercise prescription taught. Start at a low level workload and increase workload after able to maintain previous level for 30 minutes. Increase time before increasing intensity.   Understand more about Heart/Pulmonary Disease. Yes   Intervention While in program utilize professionals for any questions, and attend the education sessions. Great websites to use are www.americanheart.org or www.lung.org for reliable information.   Increase knowledge of respiratory medications and ability to use respiratory devices properly.  Yes   Diabetes Yes   Goal Blood glucose control identified by blood glucose values, HgbA1C. Participant verbalizes understanding of the signs/symptoms of hyper/hypo glycemia, proper foot care and importance of medication and nutrition plan for blood glucose control.   Intervention Provide nutrition & aerobic exercise along with prescribed medications to achieve blood glucose in normal ranges: Fasting 65-99 mg/dL   Hypertension Yes   Goal Participant will see blood pressure controlled within the values of 140/45m/Hg or within value directed by their physician.   Intervention Provide nutrition & aerobic exercise along with prescribed medications to achieve BP 140/90 or less.      Personal Goals and Risk Factors Review:      Goals and Risk Factor Review      06/18/15 1402 07/03/15 1703 07/04/15 1029 07/21/15 1816 08/06/15 1500   Increase Aerobic Exercise and Physical Activity   Goals Progress/Improvement seen  Yes No No Yes No   Comments Jamiah likes the new Biostep Exercise piece of equipment and said it is easier for him to exercise on.  Zalman TLisenbeedid not complete his rehab session.  Mrs. Waldrop walked in at the beginning of  Cardiac REhab and said here is my phone number and I want to take him to the Emerg. Dept to be checked out since he told me you had him quit exercising last evening in Cardiac Rehab when he had a lot of irregular beats after exercesing a while sitting on the Nustep. Today when he arrived and before any exercise when he was just sitting a chair for approx at least 5-10 minutes he started having a lot of PVC's every other beat. Blood pressure was stable. No c/o. Recently Lasix dose was doubled but he is on potassium also. So taken to [place] [facility] and report given to triage RN with copy of rhythm strip See Emerg Dept note ([place] [facility] Kier has had stable heart rhythms lately.  I called Habib and he has been unable to attend [facility] due to "breathing problems and even had a breathing test". Pulse ox on room air last time he was here was 96%. Will cont. to assess.    Diabetes   Goal  Blood glucose control identified by blood glucose values, HgbA1C. Participant verbalizes understanding of the signs/symptoms  of hyper/hypo glycemia, proper foot care and importance of medication and nutrition plan for blood glucose control.  Stable blood glucose readings in CR.   Blood glucose control identified by blood glucose values, HgbA1C. Participant verbalizes understanding of the signs/symptoms of hyper/hypo glycemia, proper foot care and importance of medication and nutrition plan for blood glucose control.  Stable blood sugars.     Hypertension   Goal  --  Orthostatic bp done by Roanna Epley ,RN last evening and was good. since Allon c//o of a little bit if dizziness.   --  Zarius's blood pressures are good.        Personal Goals Discharge (Final Personal Goals and Risk Factors Review):      Goals and Risk Factor Review - 08/06/15 1500    Increase Aerobic Exercise and Physical Activity   Goals  Progress/Improvement seen  No   Comments I called Tarrance and he has been unable to attend [facility] due to "breathing problems and even had a breathing test". Pulse ox on room air last time he was here was 96%. Will cont. to assess.       ITP Comments:     ITP Comments      08/19/15 1305 08/19/15 1306         ITP Comments 30 day review preparation  Continue with ITP Deagan os out with medical concern. IS taking new medications to help resolve the concern. Plans to return as soon as he is better.         Comments:

## 2015-08-19 NOTE — Telephone Encounter (Signed)
Called to check on status to return to program. Chase Cabrera has been having health issues that keep him running to the bathroom. Unable to attend program until this is resolved. He has seen doctor and is on several medications to help resolve his GI issues.  Unable to have a Colonoscopy until later in 2017 because he is on Plavix.

## 2015-08-20 NOTE — Addendum Note (Signed)
Addended by: Lynford Humphrey on: 08/20/2015 11:18 AM   Modules accepted: Orders

## 2015-09-09 ENCOUNTER — Encounter: Payer: Self-pay | Admitting: *Deleted

## 2015-09-12 ENCOUNTER — Telehealth: Payer: Self-pay | Admitting: *Deleted

## 2015-09-12 NOTE — Telephone Encounter (Signed)
Called to check on status to return to program. Last visit  07/23/2015 States is doing somewhat better.  Will return Wednesday the 4th to the 3:45 class.

## 2015-09-14 NOTE — Progress Notes (Signed)
Cardiac Individual Treatment Plan  Patient Details  Name: Chase Cabrera MRN: [id number] Date of Birth: [birth year] Referring Provider:  Concha Pyo, MD  Initial Encounter Date:    Visit Diagnosis: S/P PTCA (percutaneous transluminal coronary angioplasty) - Plan: CARDIAC REHAB 30 DAY REVIEW  Patient's Home Medications on Admission:  Current outpatient prescriptions:  .  amLODipine (NORVASC) 10 MG tablet, Take 1 tablet by mouth daily., Disp: , Rfl:  .  aspirin EC 81 MG tablet, Take 1 tablet by mouth daily., Disp: , Rfl:  .  calcium carbonate (OS-CAL - DOSED IN MG OF ELEMENTAL CALCIUM) 1250 (500 CA) MG tablet, Take 500 mg by mouth 2 (two) times daily., Disp: , Rfl:  .  carvedilol (COREG) 25 MG tablet, Take 25 mg by mouth 2 (two) times daily with a meal. , Disp: , Rfl: 1 .  clopidogrel (PLAVIX) 75 MG tablet, Take 75 mg by mouth daily., Disp: , Rfl:  .  colchicine 0.6 MG tablet, Take 1 tablet by mouth daily., Disp: , Rfl: 0 .  diclofenac sodium (VOLTAREN) 1 % GEL, Apply 2 g topically 4 (four) times daily., Disp: , Rfl:  .  diltiazem (CARDIZEM) 30 MG tablet, Take 30 mg by mouth daily as needed. For afib, Disp: , Rfl:  .  furosemide (LASIX) 20 MG tablet, Take 20-40 mg by mouth daily. 20 one day and 40 the next day. Alternate daily. Had 26m on 07-02-2015, Disp: , Rfl: 0 .  gabapentin (NEURONTIN) 300 MG capsule, Take 1 capsule by mouth 3 (three) times daily., Disp: , Rfl:  .  HUMALOG MIX 75/25 KWIKPEN (75-25) 100 UNIT/ML Kwikpen, Inject 30-44 Units into the skin 2 (two) times daily. 30 units every morning and 44 units in the evening., Disp: , Rfl: 0 .  hydrALAZINE (APRESOLINE) 100 MG tablet, Take 1 tablet by mouth 3 (three) times daily., Disp: , Rfl:  .  HYDROcodone-acetaminophen (NORCO/VICODIN) 5-325 MG per tablet, Take 1 tablet by mouth every 6 (six) hours as needed. pain, Disp: , Rfl: 0 .  isosorbide mononitrate (IMDUR) 120 MG 24 hr tablet, Take 1 tablet by mouth daily., Disp: , Rfl:  .   JANUVIA 100 MG tablet, Take 1 tablet by mouth daily., Disp: , Rfl:  .  loratadine (CLARITIN) 10 MG tablet, Take 1 tablet by mouth daily before breakfast., Disp: , Rfl:  .  LYRICA 75 MG capsule, Take 75 mg by mouth 2 (two) times daily., Disp: , Rfl: 0 .  metFORMIN (GLUCOPHAGE) 1000 MG tablet, Take 1 tablet by mouth 2 (two) times daily., Disp: , Rfl: 0 .  Multiple Vitamin (MULTI-VITAMINS) TABS, Take 1 tablet by mouth daily., Disp: , Rfl:  .  nitroGLYCERIN (NITROSTAT) 0.4 MG SL tablet, Place 1 tablet under the tongue every 5 (five) minutes x 3 doses as needed. For chest pain.  Call 911 if no relief after 3 tablets, Disp: , Rfl:  .  omeprazole (PRILOSEC) 40 MG capsule, Take 1 capsule by mouth 2 (two) times daily., Disp: , Rfl:  .  potassium chloride (K-DUR,KLOR-CON) 10 MEQ tablet, Take 10 mEq by mouth 2 (two) times daily. , Disp: , Rfl:  .  pravastatin (PRAVACHOL) 40 MG tablet, Take 40 mg by mouth daily., Disp: , Rfl: 0 .  probenecid (BENEMID) 500 MG tablet, Take 1 tablet by mouth 2 (two) times daily., Disp: , Rfl: 0 .  tamsulosin (FLOMAX) 0.4 MG CAPS capsule, Take 1 capsule by mouth daily., Disp: , Rfl:  .  vitamin C (ASCORBIC ACID)  500 MG tablet, Take 1,000 mg by mouth daily., Disp: , Rfl:  .  warfarin (COUMADIN) 3 MG tablet, Take 4 mg by mouth daily. , Disp: , Rfl: 0  Past Medical History: Past Medical History  Diagnosis Date  . Myocardial infarction ([place])   . Arthritis   . Diabetes mellitus without complication ([place])   . Cancer ([place])   . Low blood potassium   . Hypertension   . CHF (congestive heart failure) ([facility])     Tobacco Use: History  Smoking status  . Never Smoker   Smokeless tobacco  . Never Used    Labs: Recent Review Flowsheet Data    There is no flowsheet data to display.       Exercise Target Goals:    Exercise Program Goal: Individual exercise prescription set with THRR, safety & activity barriers. Participant demonstrates ability to understand and report RPE  using BORG scale, to self-measure pulse accurately, and to acknowledge the importance of the exercise prescription.  Exercise Prescription Goal: Starting with aerobic activity 30 plus minutes a day, 3 days per week for initial exercise prescription. Provide home exercise prescription and guidelines that participant acknowledges understanding prior to discharge.  Activity Barriers & Risk Stratification:     Activity Barriers & Risk Stratification - 06/06/15 1146    Activity Barriers & Risk Stratification   Activity Barriers Arthritis;Joint Problems   Risk Stratification High      6 Minute Walk:     6 Minute Walk      06/05/15 1440       6 Minute Walk   Phase Initial     Distance 512 feet     Walk Time 4 minutes     Resting HR 54 bpm     Resting BP 122/52 mmHg     Max Ex. HR 86 bpm     Max Ex. BP 140/58 mmHg     RPE 11     Symptoms Yes (comment)     Comments SOB        Initial Exercise Prescription:     Initial Exercise Prescription - 06/05/15 1400    Date of Initial Exercise Prescription   Date 06/05/15   Treadmill   MPH 0.8   Grade 0   Minutes 10   Recumbant Bike   Level 1   RPM 40   Watts 20   Minutes 10   NuStep   Level 2   Watts 40   Minutes 10   Arm Ergometer   Level 1   Watts 10   Minutes 10   REL-XR   Level 1   Watts 40   Minutes 10   Prescription Details   Frequency (times per week) 3   Duration Progress to 30 minutes of continuous aerobic without signs/symptoms of physical distress   Intensity   THRR REST +  30   Ratings of Perceived Exertion 11-15   Perceived Dyspnea 2-4   Progression Continue progressive overload as per policy without signs/symptoms or physical distress.   Resistance Training   Training Prescription Yes   Weight 2   Reps 10-15      Exercise Prescription Changes:     Exercise Prescription Changes      06/18/15 1800 06/23/15 0700 07/14/15 1100 07/23/15 1700 09/09/15 0800   Exercise Review   Progression  Yes  Yes Yes No  No visit since 07/23/15   Response to Exercise   Blood Pressure (Admit)  122/60 mmHg 114/66 mmHg  144/70 mmHg    Blood Pressure (Exercise)  132/78 mmHg 156/64 mmHg 144/62 mmHg    Blood Pressure (Exit)  116/62 mmHg 114/58 mmHg 118/80 mmHg    Heart Rate (Admit)  62 bpm 60 bpm 63 bpm    Heart Rate (Exercise)  63 bpm 69 bpm 87 bpm    Heart Rate (Exit)  59 bpm 83 bpm 62 bpm    Rating of Perceived Exertion (Exercise)  12 13 13     Symptoms  None Went to ER on 10/20 due to bigeminy heart rhythm. HE was cleared by MD to return and hasn't had any issues since. Was experiencing dizziness on the BioStep towards the end of his 30 minutes. He stopped and rested and was given water. He did not complete the weights portion of the exercises and rested instead. The dizziness went away after several minutes of rest.     Comments Third day of exercise. Patient was oriented to the gym and the equipment functions and settings. Procedures and policies of the gym were outlined and explained. The patient's individual exercise prescription and treatment plan were reviewed with them. All starting workloads were established based on the results of the functional testing  done at the initial intake visit. The plan for exercise progression was also introduced and progression will be customized based on the patient's performance and goals.  Reviewed individualized exercise prescription and made increases per departmental policy. Exercise increases were discussed with the patient and they were able to perform the new work loads without issue (no signs or symptoms).  Made increases to the level on the BioStep. Dontreal had dropped his level after his episode of bigeminy and we have now increased it back to Level 4. [place] completed this with no issue.  Making only small increases due to reoccurring symptoms when exercising in rehab. No progression until he returns and his [place] evaluated.    Duration Progress to 30 minutes  of continuous aerobic without signs/symptoms of physical distress Progress to 30 minutes of continuous aerobic without signs/symptoms of physical distress Progress to 30 minutes of continuous aerobic without signs/symptoms of physical distress Progress to 50 minutes of aerobic without signs/symptoms of physical distress Progress to 50 minutes of aerobic without signs/symptoms of physical distress   Intensity Rest + 30 Rest + 30 Rest + 30 Rest + 30 Rest + 30   Progression Continue progressive overload as per policy without signs/symptoms or physical distress. Continue progressive overload as per policy without signs/symptoms or physical distress. Continue progressive overload as per policy without signs/symptoms or physical distress. Continue progressive overload as per policy without signs/symptoms or physical distress. Continue progressive overload as per policy without signs/symptoms or physical distress.   Resistance Training   Training Prescription Yes Yes Yes Yes Yes   Weight 2 2 2 3 3    Reps 10-15 10-15 10-15 10-15 10-15   Interval Training   Interval Training  No No No No   NuStep   Level  2 2 2 2    Watts  30 30 30 30    Minutes  20 20 20 20    Recumbant Elliptical   Level  4  BioStep 4  BioStep 5  BioStep 5  BioStep   Watts  30 35 40 40   Minutes  25 25 25 25       Discharge Exercise Prescription (Final Exercise Prescription Changes):     Exercise Prescription Changes - 09/09/15 0800    Exercise Review   Progression No  No visit since 07/23/15   Response to Exercise   Comments No progression until he returns and his [place] evaluated.    Duration Progress to 50 minutes of aerobic without signs/symptoms of physical distress   Intensity Rest + 30   Progression Continue progressive overload as per policy without signs/symptoms or physical distress.   Resistance Training   Training Prescription Yes   Weight 3   Reps 10-15   Interval Training   Interval Training No   NuStep    Level 2   Watts 30   Minutes 20   Recumbant Elliptical   Level 5  BioStep   Watts 40   Minutes 25      Nutrition:  Target Goals: Understanding of nutrition guidelines, daily intake of sodium <1540m, cholesterol <209m calories 30% from fat and 7% or less from saturated fats, daily to have 5 or more servings of fruits and vegetables.  Biometrics:     Pre Biometrics - 06/05/15 1443    Pre Biometrics   Height 5' 6.5" (1.689 m)   Weight 190 lb 6.4 oz (86.365 kg)   Waist Circumference 43.5 inches   Hip Circumference 47 inches   Waist to Hip Ratio 0.93 %   BMI (Calculated) 30.3       Nutrition Therapy Plan and Nutrition Goals:     Nutrition Therapy & Goals - 06/06/15 1512    Nutrition Therapy   Drug/Food Interactions Statins/Certain Fruits;Coumadin/Vit K   Intervention Plan   Intervention Using nutrition plan and personal goals to gain a healthy nutrition lifestyle. Add exercise as prescribed.      Nutrition Discharge: Rate Your Plate Scores:     Rate Your Plate - 1268/11/5702620  Rate Your Plate Scores   Pre Score 72   Pre Score % 80 %      Nutrition Goals Re-Evaluation:     Nutrition Goals Re-Evaluation      06/18/15 1359 07/21/15 1816         Personal Goal #1 Re-Evaluation   Personal Goal #1 WAs in Cardiac Rehab 3 years ago cont to try to eat healthy.        Goal Progress Seen Yes Yes      Comments  Salbador and his wife are very knowledgable about salt, sodium saturated fats etc what not to eat.          Psychosocial: Target Goals: Acknowledge presence or absence of depression, maximize coping skills, provide positive support system. Participant is able to verbalize types and ability to use techniques and skills needed for reducing stress and depression.  Initial Review & Psychosocial Screening:     Initial Psych Review & Screening - 06/06/15 [place]   Barriers   Psychosocial barriers to  participate in program There are no identifiable barriers or psychosocial needs.      Quality of Life Scores:     Quality of Life - 06/06/15 1144    Quality of Life Scores   Health/Function Pre 22.17 %   Socioeconomic Pre 25.21 %   Psych/Spiritual Pre 28.21 %   Family Pre 28 %   GLOBAL Pre 24.9 %      PHQ-9:     Recent Review Flowsheet Data    [facility]   Decreased Interest 0   Down, Depressed, Hopeless 0   PHQ - 2 Score 0   Altered sleeping 2   Tired,  decreased energy 0   Change in appetite 0   Feeling bad or failure about yourself  0   Trouble concentrating 0   Moving slowly or fidgety/restless 0   Suicidal thoughts 0   PHQ-9 Score 2   Difficult doing work/chores Not difficult at all      Psychosocial Evaluation and Intervention:   Psychosocial Re-Evaluation:     Psychosocial Re-Evaluation      06/18/15 1400 07/03/15 1707 07/21/15 1817       Psychosocial Re-Evaluation   Interventions Encouraged to attend Cardiac Rehabilitation for the exercise Stress management education Encouraged to attend Cardiac Rehabilitation for the exercise     Comments Landen said he knew he needed to exerise more and things just kept coming up.  Creg was taken via wheelchair to [employer] [facility]. He is tired of going to the MD.  Delfino Lovett has some stress in his life with 7 stents and his wife is having ankle problems which is bothering her exercise and walking.         Vocational Rehabilitation: Provide vocational rehab assistance to qualifying candidates.   Vocational Rehab Evaluation & Intervention:     Vocational Rehab - 06/06/15 1510    Initial Vocational Rehab Evaluation & Intervention   Assessment shows need for Vocational Rehabilitation No      Education: Education Goals: Education classes will be provided on a weekly basis, covering required topics. Participant will state understanding/return demonstration of topics presented.  Learning  Barriers/Preferences:     Learning Barriers/Preferences - 06/06/15 1510    Learning Barriers/Preferences   Learning Barriers None   Learning Preferences Group Instruction      Education Topics: General Nutrition Guidelines/Fats and Fiber: -Group instruction provided by verbal, written material, models and posters to present the general guidelines for heart healthy nutrition. Gives an explanation and review of dietary fats and fiber.          Cardiac Rehab from 07/21/2015 in [facility] Cardiac and Pulmonary Rehab   Date  06/23/15   Educator  PI   Instruction Review Code  2- meets goals/outcomes      Controlling Sodium/Reading Food Labels: -Group verbal and written material supporting the discussion of sodium use in heart healthy nutrition. Review and explanation with models, verbal and written materials for utilization of the food label.   Exercise Physiology & Risk Factors: - Group verbal and written instruction with models to review the exercise physiology of the cardiovascular system and associated critical values. Details cardiovascular disease risk factors and the goals associated with each risk factor.   Aerobic Exercise & Resistance Training: - Gives group verbal and written discussion on the health impact of inactivity. On the components of aerobic and resistive training programs and the benefits of this training and how to safely progress through these programs.      Cardiac Rehab from 07/21/2015 in [facility] Cardiac and Pulmonary Rehab   Date  07/21/15   Educator  [address]. [address]   Instruction Review Code  2- meets goals/outcomes      Flexibility, Balance, General Exercise Guidelines: - Provides group verbal and written instruction on the benefits of flexibility and balance training programs. Provides general exercise guidelines with specific guidelines to those with heart or lung disease. Demonstration and skill practice provided.   Stress Management: - Provides group verbal and  written instruction about the health risks of elevated stress, cause of high stress, and healthy ways to reduce stress.   Depression: - Provides group verbal and written  instruction on the correlation between heart/lung disease and depressed mood, treatment options, and the stigmas associated with seeking treatment.      Cardiac Rehab from 07/21/2015 in [facility] Cardiac and Pulmonary Rehab   Date  07/09/15   Educator  Kathreen Cornfield   Instruction Review Code  2- meets goals/outcomes      Anatomy & Physiology of the Heart: - Group verbal and written instruction and models provide basic cardiac anatomy and physiology, with the coronary electrical and arterial systems. Review of: AMI, Angina, Valve disease, Heart Failure, Cardiac Arrhythmia, Pacemakers, and the ICD.   Cardiac Procedures: - Group verbal and written instruction and models to describe the testing methods done to diagnose heart disease. Reviews the outcomes of the test results. Describes the treatment choices: Medical Management, Angioplasty, or Coronary Bypass Surgery.   Cardiac Medications: - Group verbal and written instruction to review commonly prescribed medications for heart disease. Reviews the medication, class of the drug, and side effects. Includes the steps to properly store meds and maintain the prescription regimen.      Cardiac Rehab from 07/21/2015 in [facility] Cardiac and Pulmonary Rehab   Date  07/14/15   Educator  SB   Instruction Review Code  2- meets goals/outcomes      Go Sex-Intimacy & Heart Disease, Get SMART - Goal Setting: - Group verbal and written instruction through game format to discuss heart disease and the return to sexual intimacy. Provides group verbal and written material to discuss and apply goal setting through the application of the S.M.A.R.T. Method.   Other Matters of the Heart: - Provides group verbal, written materials and models to describe Heart Failure, Angina, Valve Disease, and Diabetes in  the realm of heart disease. Includes description of the disease process and treatment options available to the cardiac patient.      Cardiac Rehab from 07/21/2015 in [facility] Cardiac and Pulmonary Rehab   Date  06/18/15   Educator  DW   Instruction Review Code  2- meets goals/outcomes      Exercise & Equipment Safety: - Individual verbal instruction and demonstration of equipment use and safety with use of the equipment.      Cardiac Rehab from 07/21/2015 in [place] Cardiac and Pulmonary Rehab   Date  06/06/15   Educator  C. Enterkin,RN      Infection Prevention: - Provides verbal and written material to individual with discussion of infection control including proper hand washing and proper equipment cleaning during exercise session.      Cardiac Rehab from 07/21/2015 in [facility] Cardiac and Pulmonary Rehab   Date  06/06/15   Educator  C. EnterkinRN      Falls Prevention: - Provides verbal and written material to individual with discussion of falls prevention and safety.      Cardiac Rehab from 07/21/2015 in [facility] Cardiac and Pulmonary Rehab   Date  06/06/15   Educator  C. Enterkin,RN   Instruction Review Code  1- partially meets, needs review/practice      Diabetes: - Individual verbal and written instruction to review signs/symptoms of diabetes, desired ranges of glucose level fasting, after meals and with exercise. Advice that pre and post exercise glucose checks will be done for 3 sessions at entry of program.      Cardiac Rehab from 07/21/2015 in [facility] Cardiac and Pulmonary Rehab   Date  06/06/15   Educator  [place]   Instruction Review Code  1- partially meets, needs review/practice  Knowledge Questionnaire Score:     Knowledge Questionnaire Score - 06/06/15 1504    Knowledge Questionnaire Score   Pre Score 20      Personal Goals and Risk Factors at Admission:     Personal Goals and Risk Factors at Admission - 06/06/15 1145    Personal Goals and Risk Factors on  Admission    Weight Management Yes   Intervention Learn and follow the exercise and diet guidelines while in the program. Utilize the nutrition and education classes to help gain knowledge of the diet and exercise expectations in the program   Increase Aerobic Exercise and Physical Activity Yes   Intervention While in program, learn and follow the exercise prescription taught. Start at a low level workload and increase workload after able to maintain previous level for 30 minutes. Increase time before increasing intensity.   Understand more about Heart/Pulmonary Disease. Yes   Intervention While in program utilize professionals for any questions, and attend the education sessions. Great websites to use are www.americanheart.org or www.lung.org for reliable information.   Increase knowledge of respiratory medications and ability to use respiratory devices properly.  Yes   Diabetes Yes   Goal Blood glucose control identified by blood glucose values, HgbA1C. Participant verbalizes understanding of the signs/symptoms of hyper/hypo glycemia, proper foot care and importance of medication and nutrition plan for blood glucose control.   Intervention Provide nutrition & aerobic exercise along with prescribed medications to achieve blood glucose in normal ranges: Fasting 65-99 mg/dL   Hypertension Yes   Goal Participant will see blood pressure controlled within the values of 140/25m/Hg or within value directed by their physician.   Intervention Provide nutrition & aerobic exercise along with prescribed medications to achieve BP 140/90 or less.      Personal Goals and Risk Factors Review:      Goals and Risk Factor Review      06/18/15 1402 07/03/15 1703 07/04/15 1029 07/21/15 1816 08/06/15 1500   Increase Aerobic Exercise and Physical Activity   Goals Progress/Improvement seen  Yes No No Yes No   Comments Huckleberry likes the new Biostep Exercise piece of equipment and said it is easier for him to  exercise on.  Roth TColledgedid not complete his rehab session.  Mrs. Placzek walked in at the beginning of Cardiac REhab and said here is my phone number and I want to take him to the Emerg. Dept to be checked out since he told me you had him quit exercising last evening in Cardiac Rehab when he had a lot of irregular beats after exercesing a while sitting on the Nustep. Today when he arrived and before any exercise when he was just sitting a chair for approx at least 5-10 minutes he started having a lot of PVC's every other beat. Blood pressure was stable. No c/o. Recently Lasix dose was doubled but he is on potassium also. [place] and report given to triage RN with copy of rhythm strip See Emerg Dept note (discharged from [place] to be followed up [facility] Jaymz has had stable heart rhythms lately.  I called Kiano and he has been unable to attend [facility]di[facility] Rehab due to "breathing problems and even had a breathing test". Pulse ox on room air last time he was here was 96%. Will cont. to assess.    Diabetes   Goal  Blood glucose control identified by blood glucose values,  HgbA1C. Participant verbalizes understanding of the signs/symptoms of hyper/hypo glycemia, proper foot care and importance of medication and nutrition plan for blood glucose control.  Stable blood glucose readings in CR.   Blood glucose control identified by blood glucose values, HgbA1C. Participant verbalizes understanding of the signs/symptoms of hyper/hypo glycemia, proper foot care and importance of medication and nutrition plan for blood glucose control.  Stable blood sugars.     Hypertension   Goal  --  Orthostatic bp done by Roanna Epley ,RN last evening and was good. since Keshun c//o of a little bit if dizziness.   --  Harrison's blood pressures are good.        Personal Goals Discharge (Final Personal Goals and Risk Factors Review):       Goals and Risk Factor Review - 08/06/15 1500    Increase Aerobic Exercise and Physical Activity   Goals Progress/Improvement seen  No   Comments I called Osmel and he has been unable to attend [facility] Rehab due to "breathing problems and even had a breathing test". Pulse ox on room air last time he was here was 96%. Will cont. to assess.       ITP Comments:     ITP Comments      08/19/15 1305 08/19/15 1306 09/14/15 1417       ITP Comments 30 day review preparation  Continue with ITP [place] os out with medical concern. IS taking new medications to help resolve the concern. Plans to return as soon as he is better. Ready for 30 day review.  Continue with ITP  HAs been absent since 07/23/2015. Plans to return this week.        Comments:

## 2015-09-17 NOTE — Addendum Note (Signed)
Addended by: Lynford Humphrey on: 09/17/2015 11:41 AM   Modules accepted: Orders

## 2015-09-18 DIAGNOSIS — I480 Paroxysmal atrial fibrillation: Secondary | ICD-10-CM | POA: Diagnosis not present

## 2015-09-18 DIAGNOSIS — I10 Essential (primary) hypertension: Secondary | ICD-10-CM | POA: Diagnosis not present

## 2015-09-18 DIAGNOSIS — I2510 Atherosclerotic heart disease of native coronary artery without angina pectoris: Secondary | ICD-10-CM | POA: Diagnosis not present

## 2015-09-18 DIAGNOSIS — Z8679 Personal history of other diseases of the circulatory system: Secondary | ICD-10-CM | POA: Diagnosis not present

## 2015-09-18 DIAGNOSIS — Z87891 Personal history of nicotine dependence: Secondary | ICD-10-CM | POA: Diagnosis not present

## 2015-09-18 DIAGNOSIS — R5381 Other malaise: Secondary | ICD-10-CM | POA: Diagnosis not present

## 2015-09-18 DIAGNOSIS — R079 Chest pain, unspecified: Secondary | ICD-10-CM | POA: Diagnosis not present

## 2015-09-18 DIAGNOSIS — Z79899 Other long term (current) drug therapy: Secondary | ICD-10-CM | POA: Diagnosis not present

## 2015-09-18 DIAGNOSIS — Z7901 Long term (current) use of anticoagulants: Secondary | ICD-10-CM | POA: Diagnosis not present

## 2015-09-18 DIAGNOSIS — J9811 Atelectasis: Secondary | ICD-10-CM | POA: Diagnosis not present

## 2015-09-18 DIAGNOSIS — I517 Cardiomegaly: Secondary | ICD-10-CM | POA: Diagnosis not present

## 2015-09-18 DIAGNOSIS — R109 Unspecified abdominal pain: Secondary | ICD-10-CM | POA: Diagnosis not present

## 2015-09-18 DIAGNOSIS — Z7982 Long term (current) use of aspirin: Secondary | ICD-10-CM | POA: Diagnosis not present

## 2015-09-18 DIAGNOSIS — R112 Nausea with vomiting, unspecified: Secondary | ICD-10-CM | POA: Diagnosis not present

## 2015-09-18 DIAGNOSIS — R4182 Altered mental status, unspecified: Secondary | ICD-10-CM | POA: Diagnosis not present

## 2015-09-19 DIAGNOSIS — R4182 Altered mental status, unspecified: Secondary | ICD-10-CM | POA: Diagnosis not present

## 2015-09-19 DIAGNOSIS — R112 Nausea with vomiting, unspecified: Secondary | ICD-10-CM | POA: Diagnosis not present

## 2015-09-26 DIAGNOSIS — M25552 Pain in left hip: Secondary | ICD-10-CM | POA: Diagnosis not present

## 2015-09-26 DIAGNOSIS — M1612 Unilateral primary osteoarthritis, left hip: Secondary | ICD-10-CM | POA: Diagnosis not present

## 2015-09-29 ENCOUNTER — Encounter: Payer: Self-pay | Admitting: *Deleted

## 2015-09-29 DIAGNOSIS — R197 Diarrhea, unspecified: Secondary | ICD-10-CM | POA: Diagnosis not present

## 2015-09-30 DIAGNOSIS — M1612 Unilateral primary osteoarthritis, left hip: Secondary | ICD-10-CM | POA: Diagnosis not present

## 2015-10-01 DIAGNOSIS — R0789 Other chest pain: Secondary | ICD-10-CM | POA: Diagnosis not present

## 2015-10-01 DIAGNOSIS — N63 Unspecified lump in breast: Secondary | ICD-10-CM | POA: Diagnosis not present

## 2015-10-03 ENCOUNTER — Encounter: Admission: RE | Payer: Self-pay | Source: Ambulatory Visit

## 2015-10-03 ENCOUNTER — Other Ambulatory Visit: Payer: Self-pay | Admitting: Physician Assistant

## 2015-10-03 ENCOUNTER — Ambulatory Visit: Admission: RE | Admit: 2015-10-03 | Payer: Medicare Other | Source: Ambulatory Visit | Admitting: Gastroenterology

## 2015-10-03 DIAGNOSIS — R0789 Other chest pain: Secondary | ICD-10-CM

## 2015-10-03 DIAGNOSIS — N63 Unspecified lump in unspecified breast: Secondary | ICD-10-CM

## 2015-10-03 SURGERY — COLONOSCOPY WITH PROPOFOL
Anesthesia: General

## 2015-10-08 DIAGNOSIS — M1612 Unilateral primary osteoarthritis, left hip: Secondary | ICD-10-CM | POA: Diagnosis not present

## 2015-10-10 ENCOUNTER — Ambulatory Visit
Admission: RE | Admit: 2015-10-10 | Discharge: 2015-10-10 | Disposition: A | Discharge status: Home / Self Care | Payer: Medicare Other | Source: Ambulatory Visit | Attending: Physician Assistant | Admitting: Physician Assistant

## 2015-10-10 ENCOUNTER — Other Ambulatory Visit: Payer: Self-pay | Admitting: Physician Assistant

## 2015-10-10 ENCOUNTER — Telehealth: Payer: Self-pay | Admitting: *Deleted

## 2015-10-10 DIAGNOSIS — R0789 Other chest pain: Secondary | ICD-10-CM

## 2015-10-10 DIAGNOSIS — N63 Unspecified lump in unspecified breast: Secondary | ICD-10-CM

## 2015-10-10 DIAGNOSIS — N644 Mastodynia: Secondary | ICD-10-CM | POA: Insufficient documentation

## 2015-10-10 NOTE — Telephone Encounter (Signed)
Called to check on status to return to program. Requires a hip replacement, waiting for cardiologist to give clearance for the surgery.  Will discharge from [facility].

## 2015-10-10 NOTE — Progress Notes (Signed)
Cardiac Individual Treatment Plan  Patient Details  Name: Chase Cabrera MRN: [id number] Date of Birth: [birth year] Referring Provider:  Concha Pyo, MD  Initial Encounter Date:    Visit Diagnosis: S/P PTCA (percutaneous transluminal coronary angioplasty) - Plan: CARDIAC REHAB 30 DAY REVIEW, CARDIAC REHAB 30 DAY REVIEW  Patient's Home Medications on Admission:  Current outpatient prescriptions:  .  amLODipine (NORVASC) 10 MG tablet, Take 1 tablet by mouth daily., Disp: , Rfl:  .  aspirin EC 81 MG tablet, Take 1 tablet by mouth daily., Disp: , Rfl:  .  calcium carbonate (OS-CAL - DOSED IN MG OF ELEMENTAL CALCIUM) 1250 (500 CA) MG tablet, Take 500 mg by mouth 2 (two) times daily., Disp: , Rfl:  .  carvedilol (COREG) 25 MG tablet, Take 25 mg by mouth 2 (two) times daily with a meal. , Disp: , Rfl: 1 .  clopidogrel (PLAVIX) 75 MG tablet, Take 75 mg by mouth daily., Disp: , Rfl:  .  colchicine 0.6 MG tablet, Take 1 tablet by mouth daily., Disp: , Rfl: 0 .  diclofenac sodium (VOLTAREN) 1 % GEL, Apply 2 g topically 4 (four) times daily., Disp: , Rfl:  .  diltiazem (CARDIZEM) 30 MG tablet, Take 30 mg by mouth daily as needed. For afib, Disp: , Rfl:  .  furosemide (LASIX) 20 MG tablet, Take 20-40 mg by mouth daily. 20 one day and 40 the next day. Alternate daily. Had 70m on 07-02-2015, Disp: , Rfl: 0 .  gabapentin (NEURONTIN) 300 MG capsule, Take 1 capsule by mouth 3 (three) times daily., Disp: , Rfl:  .  HUMALOG MIX 75/25 KWIKPEN (75-25) 100 UNIT/ML Kwikpen, Inject 30-44 Units into the skin 2 (two) times daily. 30 units every morning and 44 units in the evening., Disp: , Rfl: 0 .  hydrALAZINE (APRESOLINE) 100 MG tablet, Take 1 tablet by mouth 3 (three) times daily., Disp: , Rfl:  .  HYDROcodone-acetaminophen (NORCO/VICODIN) 5-325 MG per tablet, Take 1 tablet by mouth every 6 (six) hours as needed. pain, Disp: , Rfl: 0 .  isosorbide mononitrate (IMDUR) 120 MG 24 hr tablet, Take 1 tablet by mouth  daily., Disp: , Rfl:  .  JANUVIA 100 MG tablet, Take 1 tablet by mouth daily., Disp: , Rfl:  .  loratadine (CLARITIN) 10 MG tablet, Take 1 tablet by mouth daily before breakfast., Disp: , Rfl:  .  LYRICA 75 MG capsule, Take 75 mg by mouth 2 (two) times daily., Disp: , Rfl: 0 .  metFORMIN (GLUCOPHAGE) 1000 MG tablet, Take 1 tablet by mouth 2 (two) times daily., Disp: , Rfl: 0 .  Multiple Vitamin (MULTI-VITAMINS) TABS, Take 1 tablet by mouth daily., Disp: , Rfl:  .  nitroGLYCERIN (NITROSTAT) 0.4 MG SL tablet, Place 1 tablet under the tongue every 5 (five) minutes x 3 doses as needed. For chest pain.  Call 911 if no relief after 3 tablets, Disp: , Rfl:  .  omeprazole (PRILOSEC) 40 MG capsule, Take 1 capsule by mouth 2 (two) times daily., Disp: , Rfl:  .  potassium chloride (K-DUR,KLOR-CON) 10 MEQ tablet, Take 10 mEq by mouth 2 (two) times daily. , Disp: , Rfl:  .  pravastatin (PRAVACHOL) 40 MG tablet, Take 40 mg by mouth daily., Disp: , Rfl: 0 .  probenecid (BENEMID) 500 MG tablet, Take 1 tablet by mouth 2 (two) times daily., Disp: , Rfl: 0 .  tamsulosin (FLOMAX) 0.4 MG CAPS capsule, Take 1 capsule by mouth daily., Disp: , Rfl:  .  vitamin C (ASCORBIC ACID) 500 MG tablet, Take 1,000 mg by mouth daily., Disp: , Rfl:  .  warfarin (COUMADIN) 3 MG tablet, Take 4 mg by mouth daily. , Disp: , Rfl: 0  Past Medical History: Past Medical History  Diagnosis Date  . Myocardial infarction ([place])   . Arthritis   . Diabetes mellitus without complication ([place])   . Cancer ([place])   . Low blood potassium   . Hypertension   . CHF (congestive heart failure) ([facility])     Tobacco Use: History  Smoking status  . Never Smoker   Smokeless tobacco  . Never Used    Labs: Recent Review Flowsheet Data    There is no flowsheet data to display.       Exercise Target Goals:    Exercise Program Goal: Individual exercise prescription set with THRR, safety & activity barriers. Participant demonstrates ability to  understand and report RPE using BORG scale, to self-measure pulse accurately, and to acknowledge the importance of the exercise prescription.  Exercise Prescription Goal: Starting with aerobic activity 30 plus minutes a day, 3 days per week for initial exercise prescription. Provide home exercise prescription and guidelines that participant acknowledges understanding prior to discharge.  Activity Barriers & Risk Stratification:     Activity Barriers & Risk Stratification - 06/06/15 1146    Activity Barriers & Risk Stratification   Activity Barriers Arthritis;Joint Problems   Risk Stratification High      6 Minute Walk:     6 Minute Walk      06/05/15 1440       6 Minute Walk   Phase Initial     Distance 512 feet     Walk Time 4 minutes     Resting HR 54 bpm     Resting BP 122/52 mmHg     Max Ex. HR 86 bpm     Max Ex. BP 140/58 mmHg     RPE 11     Symptoms Yes (comment)     Comments SOB        Initial Exercise Prescription:     Initial Exercise Prescription - 06/05/15 1400    Date of Initial Exercise Prescription   Date 06/05/15   Treadmill   MPH 0.8   Grade 0   Minutes 10   Recumbant Bike   Level 1   RPM 40   Watts 20   Minutes 10   NuStep   Level 2   Watts 40   Minutes 10   Arm Ergometer   Level 1   Watts 10   Minutes 10   REL-XR   Level 1   Watts 40   Minutes 10   Prescription Details   Frequency (times per week) 3   Duration Progress to 30 minutes of continuous aerobic without signs/symptoms of physical distress   Intensity   THRR REST +  30   Ratings of Perceived Exertion 11-15   Perceived Dyspnea 2-4   Progression Continue progressive overload as per policy without signs/symptoms or physical distress.   Resistance Training   Training Prescription Yes   Weight 2   Reps 10-15      Exercise Prescription Changes:     Exercise Prescription Changes      06/18/15 1800 06/23/15 0700 07/14/15 1100 07/23/15 1700 09/09/15 0800   Exercise  Review   Progression  Yes Yes Yes No  No visit since 07/23/15   Response to Exercise   Blood Pressure (Admit)  122/60 mmHg 114/66 mmHg 144/70 mmHg    Blood Pressure (Exercise)  132/78 mmHg 156/64 mmHg 144/62 mmHg    Blood Pressure (Exit)  116/62 mmHg 114/58 mmHg 118/80 mmHg    Heart Rate (Admit)  62 bpm 60 bpm 63 bpm    Heart Rate (Exercise)  63 bpm 69 bpm 87 bpm    Heart Rate (Exit)  59 bpm 83 bpm 62 bpm    Rating of Perceived Exertion (Exercise)  12 13 13     Symptoms  None Went to ER on 10/20 due to bigeminy heart rhythm. HE was cleared by MD to return and hasn't had any issues since. Was experiencing dizziness on the BioStep towards the end of his 30 minutes. He stopped and rested and was given water. He did not complete the weights portion of the exercises and rested instead. The dizziness went away after several minutes of rest.     Comments Third day of exercise. Patient was oriented to the gym and the equipment functions and settings. Procedures and policies of the gym were outlined and explained. The patient's individual exercise prescription and treatment plan were reviewed with them. All starting workloads were established based on the results of the functional testing  done at the initial intake visit. The plan for exercise progression was also introduced and progression will be customized based on the patient's performance and goals.  Reviewed individualized exercise prescription and made increases per departmental policy. Exercise increases were discussed with the patient and they were able to perform the new work loads without issue (no signs or symptoms).  Made increases to the level on the BioStep. Chase Cabrera had dropped his level after his episode of bigeminy and we have now increased it back to Level 4. Chase Cabrera completed this with no issue.  Making only small increases due to reoccurring symptoms when exercising in rehab. No progression until he returns and his [place] evaluated.     Duration Progress to 30 minutes of continuous aerobic without signs/symptoms of physical distress Progress to 30 minutes of continuous aerobic without signs/symptoms of physical distress Progress to 30 minutes of continuous aerobic without signs/symptoms of physical distress Progress to 50 minutes of aerobic without signs/symptoms of physical distress Progress to 50 minutes of aerobic without signs/symptoms of physical distress   Intensity Rest + 30 Rest + 30 Rest + 30 Rest + 30 Rest + 30   Progression Continue progressive overload as per policy without signs/symptoms or physical distress. Continue progressive overload as per policy without signs/symptoms or physical distress. Continue progressive overload as per policy without signs/symptoms or physical distress. Continue progressive overload as per policy without signs/symptoms or physical distress. Continue progressive overload as per policy without signs/symptoms or physical distress.   Resistance Training   Training Prescription Yes Yes Yes Yes Yes   Weight 2 2 2 3 3    Reps 10-15 10-15 10-15 10-15 10-15   Interval Training   Interval Training  No No No No   NuStep   Level  2 2 2 2    Watts  30 30 30 30    Minutes  20 20 20 20    Recumbant Elliptical   Level  4  BioStep 4  BioStep 5  BioStep 5  BioStep   Watts  30 35 40 40   Minutes  25 25 25 25      09/29/15 1000           Exercise Review   Progression No  No visit since 07/23/15  Response to Exercise   Comments No progression until he returns and his [place] evaluated.        Duration Progress to 50 minutes of aerobic without signs/symptoms of physical distress       Intensity Rest + 30       Progression Continue progressive overload as per policy without signs/symptoms or physical distress.       Resistance Training   Training Prescription Yes       Weight 3       Reps 10-15       Interval Training   Interval Training No       NuStep   Level 2       Watts 30        Minutes 20       Recumbant Elliptical   Level 5  BioStep       Watts 40       Minutes 25          Discharge Exercise Prescription (Final Exercise Prescription Changes):     Exercise Prescription Changes - 09/29/15 1000    Exercise Review   Progression No  No visit since 07/23/15   Response to Exercise   Comments No progression until he returns and his [place] evaluated.    Duration Progress to 50 minutes of aerobic without signs/symptoms of physical distress   Intensity Rest + 30   Progression Continue progressive overload as per policy without signs/symptoms or physical distress.   Resistance Training   Training Prescription Yes   Weight 3   Reps 10-15   Interval Training   Interval Training No   NuStep   Level 2   Watts 30   Minutes 20   Recumbant Elliptical   Level 5  BioStep   Watts 40   Minutes 25      Nutrition:  Target Goals: Understanding of nutrition guidelines, daily intake of sodium <1537m, cholesterol <2066m calories 30% from fat and 7% or less from saturated fats, daily to have 5 or more servings of fruits and vegetables.  Biometrics:     Pre Biometrics - 06/05/15 1443    Pre Biometrics   Height 5' 6.5" (1.689 m)   Weight 190 lb 6.4 oz (86.365 kg)   Waist Circumference 43.5 inches   Hip Circumference 47 inches   Waist to Hip Ratio 0.93 %   BMI (Calculated) 30.3       Nutrition Therapy Plan and Nutrition Goals:     Nutrition Therapy & Goals - 06/06/15 1512    Nutrition Therapy   Drug/Food Interactions Statins/Certain Fruits;Coumadin/Vit K   Intervention Plan   Intervention Using nutrition plan and personal goals to gain a healthy nutrition lifestyle. Add exercise as prescribed.      Nutrition Discharge: Rate Your Plate Scores:     Rate Your Plate - 1212/24/8205003  Rate Your Plate Scores   Pre Score 72   Pre Score % 80 %      Nutrition Goals Re-Evaluation:     Nutrition Goals Re-Evaluation      06/18/15 1359 07/21/15 1816          Personal Goal #1 Re-Evaluation   Personal Goal #1 WAs in Cardiac Rehab 3 years ago cont to try to eat healthy.        Goal Progress Seen Yes Yes      Comments  Chase Cabrera and his wife are very knowledgable about salt, sodium saturated fats etc what not to  eat.          Psychosocial: Target Goals: Acknowledge presence or absence of depression, maximize coping skills, provide positive support system. Participant is able to verbalize types and ability to use techniques and skills needed for reducing stress and depression.  Initial Review & Psychosocial Screening:     Initial Psych Review & Screening - 06/06/15 [place]? Yes   Barriers   Psychosocial barriers to participate in program There are no identifiable barriers or psychosocial needs.      Quality of Life Scores:     Quality of Life - 06/06/15 1144    Quality of Life Scores   Health/Function Pre 22.17 %   Socioeconomic Pre 25.21 %   Psych/Spiritual Pre 28.21 %   Family Pre 28 %   GLOBAL Pre 24.9 %      PHQ-9:     Recent Review Flowsheet Data    Depression screen [facility] 2/9 06/06/2015   Decreased Interest 0   Down, Depressed, Hopeless 0   PHQ - 2 Score 0   Altered sleeping 2   Tired, decreased energy 0   Change in appetite 0   Feeling bad or failure about yourself  0   Trouble concentrating 0   Moving slowly or fidgety/restless 0   Suicidal thoughts 0   PHQ-9 Score 2   Difficult doing work/chores Not difficult at all      Psychosocial Evaluation and Intervention:   Psychosocial Re-Evaluation:     Psychosocial Re-Evaluation      06/18/15 1400 07/03/15 1707 07/21/15 1817       Psychosocial Re-Evaluation   Interventions Encouraged to attend Cardiac Rehabilitation for the exercise Stress management education Encouraged to attend Cardiac Rehabilitation for the exercise     Comments Chase Cabrera said he knew he needed to exerise more and things just kept coming up.   Chase Cabrera was taken via wheelchair to [facility]. He is tired of going to the MD.  Chase Cabrera has some stress in his life with 7 stents and his wife is having ankle problems which is bothering her exercise and walking.         Vocational Rehabilitation: Provide vocational rehab assistance to qualifying candidates.   Vocational Rehab Evaluation & Intervention:     Vocational Rehab - 06/06/15 1510    Initial Vocational Rehab Evaluation & Intervention   Assessment shows need for Vocational Rehabilitation No      Education: Education Goals: Education classes will be provided on a weekly basis, covering required topics. Participant will state understanding/return demonstration of topics presented.  Learning Barriers/Preferences:     Learning Barriers/Preferences - 06/06/15 1510    Learning Barriers/Preferences   Learning Barriers None   Learning Preferences Group Instruction      Education Topics: General Nutrition Guidelines/Fats and Fiber: -Group instruction provided by verbal, written material, models and posters to present the general guidelines for heart healthy nutrition. Gives an explanation and review of dietary fats and fiber.          Cardiac Rehab from 07/21/2015 in [facility] Cardiac and Pulmonary Rehab   Date  06/23/15   Educator  PI   Instruction Review Code  2- meets goals/outcomes      Controlling Sodium/Reading Food Labels: -Group verbal and written material supporting the discussion of sodium use in heart healthy nutrition. Review and explanation with models, verbal and written materials for utilization of the food label.   Exercise Physiology &  Risk Factors: - Group verbal and written instruction with models to review the exercise physiology of the cardiovascular system and associated critical values. Details cardiovascular disease risk factors and the goals associated with each risk factor.   Aerobic Exercise & Resistance Training: - Gives group verbal and written  discussion on the health impact of inactivity. On the components of aerobic and resistive training programs and the benefits of this training and how to safely progress through these programs.      Cardiac Rehab from 07/21/2015 in [facility] Cardiac and Pulmonary Rehab   Date  07/21/15   Educator  S. [address]   Instruction Review Code  2- meets goals/outcomes      Flexibility, Balance, General Exercise Guidelines: - Provides group verbal and written instruction on the benefits of flexibility and balance training programs. Provides general exercise guidelines with specific guidelines to those with heart or lung disease. Demonstration and skill practice provided.   Stress Management: - Provides group verbal and written instruction about the health risks of elevated stress, cause of high stress, and healthy ways to reduce stress.   Depression: - Provides group verbal and written instruction on the correlation between heart/lung disease and depressed mood, treatment options, and the stigmas associated with seeking treatment.      Cardiac Rehab from 07/21/2015 in [facility] Cardiac and Pulmonary Rehab   Date  07/09/15   Educator  Kathreen Cornfield   Instruction Review Code  2- meets goals/outcomes      Anatomy & Physiology of the Heart: - Group verbal and written instruction and models provide basic cardiac anatomy and physiology, with the coronary electrical and arterial systems. Review of: AMI, Angina, Valve disease, Heart Failure, Cardiac Arrhythmia, Pacemakers, and the ICD.   Cardiac Procedures: - Group verbal and written instruction and models to describe the testing methods done to diagnose heart disease. Reviews the outcomes of the test results. Describes the treatment choices: Medical Management, Angioplasty, or Coronary Bypass Surgery.   Cardiac Medications: - Group verbal and written instruction to review commonly prescribed medications for heart disease. Reviews the medication, class of the drug, and  side effects. Includes the steps to properly store meds and maintain the prescription regimen.      Cardiac Rehab from 07/21/2015 in [facility] Cardiac and Pulmonary Rehab   Date  07/14/15   Educator  SB   Instruction Review Code  2- meets goals/outcomes      Go Sex-Intimacy & Heart Disease, Get SMART - Goal Setting: - Group verbal and written instruction through game format to discuss heart disease and the return to sexual intimacy. Provides group verbal and written material to discuss and apply goal setting through the application of the S.M.A.R.T. Method.   Other Matters of the Heart: - Provides group verbal, written materials and models to describe Heart Failure, Angina, Valve Disease, and Diabetes in the realm of heart disease. Includes description of the disease process and treatment options available to the cardiac patient.      Cardiac Rehab from 07/21/2015 in [facility] Cardiac and Pulmonary Rehab   Date  06/18/15   Educator  DW   Instruction Review Code  2- meets goals/outcomes      Exercise & Equipment Safety: - Individual verbal instruction and demonstration of equipment use and safety with use of the equipment.      Cardiac Rehab from 07/21/2015 in [facility] Cardiac and Pulmonary Rehab   Date  06/06/15   Educator  C. Enterkin,RN      Infection  Prevention: - Provides verbal and written material to individual with discussion of infection control including proper hand washing and proper equipment cleaning during exercise session.      Cardiac Rehab from 07/21/2015 in [facility] Cardiac and Pulmonary Rehab   Date  06/06/15   Educator  C. EnterkinRN      Falls Prevention: - Provides verbal and written material to individual with discussion of falls prevention and safety.      Cardiac Rehab from 07/21/2015 in [facility] Cardiac and Pulmonary Rehab   Date  06/06/15   Educator  C. Enterkin,RN   Instruction Review Code  1- partially meets, needs review/practice      Diabetes: - Individual verbal and  written instruction to review signs/symptoms of diabetes, desired ranges of glucose level fasting, after meals and with exercise. Advice that pre and post exercise glucose checks will be done for 3 sessions at entry of program.      Cardiac Rehab from 07/21/2015 in [facility] Cardiac and Pulmonary Rehab   Date  06/06/15   Educator  [place]   Instruction Review Code  1- partially meets, needs review/practice       Knowledge Questionnaire Score:     Knowledge Questionnaire Score - 06/06/15 1504    Knowledge Questionnaire Score   Pre Score 20      Personal Goals and Risk Factors at Admission:     Personal Goals and Risk Factors at Admission - 06/06/15 1145    Personal Goals and Risk Factors on Admission    Weight Management Yes   Intervention Learn and follow the exercise and diet guidelines while in the program. Utilize the nutrition and education classes to help gain knowledge of the diet and exercise expectations in the program   Increase Aerobic Exercise and Physical Activity Yes   Intervention While in program, learn and follow the exercise prescription taught. Start at a low level workload and increase workload after able to maintain previous level for 30 minutes. Increase time before increasing intensity.   Understand more about Heart/Pulmonary Disease. Yes   Intervention While in program utilize professionals for any questions, and attend the education sessions. Great websites to use are www.americanheart.org or www.lung.org for reliable information.   Increase knowledge of respiratory medications and ability to use respiratory devices properly.  Yes   Diabetes Yes   Goal Blood glucose control identified by blood glucose values, HgbA1C. Participant verbalizes understanding of the signs/symptoms of hyper/hypo glycemia, proper foot care and importance of medication and nutrition plan for blood glucose control.   Intervention Provide nutrition & aerobic exercise along with prescribed  medications to achieve blood glucose in normal ranges: Fasting 65-99 mg/dL   Hypertension Yes   Goal Participant will see blood pressure controlled within the values of 140/66m/Hg or within value directed by their physician.   Intervention Provide nutrition & aerobic exercise along with prescribed medications to achieve BP 140/90 or less.      Personal Goals and Risk Factors Review:      Goals and Risk Factor Review      06/18/15 1402 07/03/15 1703 07/04/15 1029 07/21/15 1816 08/06/15 1500   Increase Aerobic Exercise and Physical Activity   Goals Progress/Improvement seen  Yes No No Yes No   Comments Chase Cabrera likes the new Biostep Exercise piece of equipment and said it is easier for him to exercise on.  Chase Cabrera not complete his rehab session.  Chase Cabrera walked in at the beginning of Cardiac REhab and said here  is my phone number and I want to take him to the Emerg. Dept to be checked out since he told me you had him quit exercising last evening in Cardiac Rehab when he had a lot of irregular beats after exercesing a while sitting on the Nustep. Today when he arrived and before any exercise when he was just sitting a chair for approx at least 5-10 minutes he started having a lot of PVC's every other beat. Blood pressure was stable. No c/o. Recently Lasix dose was doubled but he is on potassium also. So taken to [facility] via wheelchair and report given to triage RN with copy of rhythm strip See Emerg Dept note (discharged from [facility] after several hours to be followed up by Dr. Lillia Corporal at [facility]). Chase Cabrera has had stable heart rhythms lately.  I called Chase Cabrera and he has been unable to attend [facility] Rehab due to "breathing problems and even had a breathing test". Pulse ox on room air last time he was here was 96%. Will cont. to assess.    Diabetes   Goal  Blood glucose control identified by blood glucose values, HgbA1C. Participant verbalizes understanding of the  signs/symptoms of hyper/hypo glycemia, proper foot care and importance of medication and nutrition plan for blood glucose control.  Stable blood glucose readings in CR.   Blood glucose control identified by blood glucose values, HgbA1C. Participant verbalizes understanding of the signs/symptoms of hyper/hypo glycemia, proper foot care and importance of medication and nutrition plan for blood glucose control.  Stable blood sugars.     Hypertension   Goal  --  Orthostatic bp done by Chase Cabrera ,RN last evening and was good. since Chase Cabrera c//o of a little bit if dizziness.   --  Chase Cabrera's blood pressures are good.       09/29/15 1042           Increase Aerobic Exercise and Physical Activity   Goals Progress/Improvement seen  No       Comments Ex. Rx. will be re-evaluated due to extended absence          Personal Goals Discharge (Final Personal Goals and Risk Factors Review):      Goals and Risk Factor Review - 09/29/15 1042    Increase Aerobic Exercise and Physical Activity   Goals Progress/Improvement seen  No   Comments Ex. Rx. will be re-evaluated due to extended absence      ITP Comments:     ITP Comments      08/19/15 1305 08/19/15 1306 09/14/15 1417 10/10/15 1246 10/10/15 1247   ITP Comments 30 day review preparation  Continue with ITP Chase Cabrera os out with medical concern. IS taking new medications to help resolve the concern. Plans to return as soon as he is better. Ready for 30 day review.  Continue with ITP  HAs been absent since 07/23/2015. Plans to return this week. discharged Needs total hip replacement, waiting for cardiologist to give clearance for the surgery.       Comments:

## 2015-10-10 NOTE — Progress Notes (Signed)
Discharge Summary  Patient Details  Name: Chase Cabrera MRN: [id number] Date of Birth: May 28, 1946 Referring Provider:  Concha Pyo, MD   Number of Visits: 16/36  Reason for Discharge:  Early Exit:  Needs total hip replacement. Discharged  Smoking History:  History  Smoking status  . Never Smoker   Smokeless tobacco  . Never Used    Diagnosis:  S/P PTCA (percutaneous transluminal coronary angioplasty) - Plan: CARDIAC REHAB 30 DAY REVIEW, CARDIAC REHAB 30 DAY REVIEW, CARDIAC REHAB 30 DAY REVIEW  ADL UCSD:   Initial Exercise Prescription:     Initial Exercise Prescription - 06/05/15 1400    Date of Initial Exercise Prescription   Date 06/05/15   Treadmill   MPH 0.8   Grade 0   Minutes 10   Recumbant Bike   Level 1   RPM 40   Watts 20   Minutes 10   NuStep   Level 2   Watts 40   Minutes 10   Arm Ergometer   Level 1   Watts 10   Minutes 10   REL-XR   Level 1   Watts 40   Minutes 10   Prescription Details   Frequency (times per week) 3   Duration Progress to 30 minutes of continuous aerobic without signs/symptoms of physical distress   Intensity   THRR REST +  30   Ratings of Perceived Exertion 11-15   Perceived Dyspnea 2-4   Progression Continue progressive overload as per policy without signs/symptoms or physical distress.   Resistance Training   Training Prescription Yes   Weight 2   Reps 10-15      Discharge Exercise Prescription (Final Exercise Prescription Changes):     Exercise Prescription Changes - 09/29/15 1000    Exercise Review   Progression No  No visit since 07/23/15   Response to Exercise   Comments No progression until he returns and his [place] evaluated.    Duration Progress to 50 minutes of aerobic without signs/symptoms of physical distress   Intensity Rest + 30   Progression Continue progressive overload as per policy without signs/symptoms or physical distress.   Resistance Training   Training Prescription Yes   Weight 3   Reps 10-15   Interval Training   Interval Training No   NuStep   Level 2   Watts 30   Minutes 20   Recumbant Elliptical   Level 5  BioStep   Watts 40   Minutes 25      Functional Capacity:     6 Minute Walk      06/05/15 1440       6 Minute Walk   Phase Initial     Distance 512 feet     Walk Time 4 minutes     Resting HR 54 bpm     Resting BP 122/52 mmHg     Max Ex. HR 86 bpm     Max Ex. BP 140/58 mmHg     RPE 11     Symptoms Yes (comment)     Comments SOB        Psychological, QOL, Others - Outcomes: PHQ 2/9: Depression screen PHQ 2/9 06/06/2015  Decreased Interest 0  Down, Depressed, Hopeless 0  PHQ - 2 Score 0  Altered sleeping 2  Tired, decreased energy 0  Change in appetite 0  Feeling bad or failure about yourself  0  Trouble concentrating 0  Moving slowly or fidgety/restless 0  Suicidal thoughts 0  PHQ-9  Score 2  Difficult doing work/chores Not difficult at all    Quality of Life:     Quality of Life - 06/06/15 1144    Quality of Life Scores   Health/Function Pre 22.17 %   Socioeconomic Pre 25.21 %   Psych/Spiritual Pre 28.21 %   Family Pre 28 %   GLOBAL Pre 24.9 %      Personal Goals: Goals established at orientation with interventions provided to work toward goal.     Personal Goals and Risk Factors at Admission - 06/06/15 1145    Personal Goals and Risk Factors on Admission    Weight Management Yes   Intervention Learn and follow the exercise and diet guidelines while in the program. Utilize the nutrition and education classes to help gain knowledge of the diet and exercise expectations in the program   Increase Aerobic Exercise and Physical Activity Yes   Intervention While in program, learn and follow the exercise prescription taught. Start at a low level workload and increase workload after able to maintain previous level for 30 minutes. Increase time before increasing intensity.   Understand more about Heart/Pulmonary  Disease. Yes   Intervention While in program utilize professionals for any questions, and attend the education sessions. Great websites to use are www.americanheart.org or www.lung.org for reliable information.   Increase knowledge of respiratory medications and ability to use respiratory devices properly.  Yes   Diabetes Yes   Goal Blood glucose control identified by blood glucose values, HgbA1C. Participant verbalizes understanding of the signs/symptoms of hyper/hypo glycemia, proper foot care and importance of medication and nutrition plan for blood glucose control.   Intervention Provide nutrition & aerobic exercise along with prescribed medications to achieve blood glucose in normal ranges: Fasting 65-99 mg/dL   Hypertension Yes   Goal Participant will see blood pressure controlled within the values of 140/67m/Hg or within value directed by their physician.   Intervention Provide nutrition & aerobic exercise along with prescribed medications to achieve BP 140/90 or less.       Personal Goals Discharge:     Goals and Risk Factor Review      06/18/15 1402 07/03/15 1703 07/04/15 1029 07/21/15 1816 08/06/15 1500   Increase Aerobic Exercise and Physical Activity   Goals Progress/Improvement seen  Yes No No Yes No   Comments Shown likes the new Biostep Exercise piece of equipment and said it is easier for him to exercise on.  Chase Cabrera not complete his rehab session.  Chase Cabrera walked in at the beginning of Cardiac REhab and said here is my phone number and I want to take him to the Emerg. Dept to be checked out since he told me you had him quit exercising last evening in Cardiac Rehab when he had a lot of irregular beats after exercesing a while sitting on the Nustep. Today when he arrived and before any exercise when he was just sitting a chair for approx at least 5-10 minutes he started having a lot of PVC's every other beat. Blood pressure was stable. No c/o. Recently Lasix dose  was doubled but he is on potassium also. So taken to Chase Cabrera wheelchair and report given to triage RN with copy of rhythm strip See Emerg Dept note ([facility] Chase Cabrera has had stable heart rhythms lately.  I called Faizan and he has been unable to attend [facility]  due to "breathing problems and even had a breathing test". Pulse ox on room air last time he was here was 96%. Will cont. to assess.    Diabetes   Goal  Blood glucose control identified by blood glucose values, HgbA1C. Participant verbalizes understanding of the signs/symptoms of hyper/hypo glycemia, proper foot care and importance of medication and nutrition plan for blood glucose control.  Stable blood glucose readings in CR.   Blood glucose control identified by blood glucose values, HgbA1C. Participant verbalizes understanding of the signs/symptoms of hyper/hypo glycemia, proper foot care and importance of medication and nutrition plan for blood glucose control.  Stable blood sugars.     Hypertension   Goal  --  Orthostatic bp done by Roanna Epley ,RN last evening and was good. since Calob c//o of a little bit if dizziness.   --  Braydan's blood pressures are good.       09/29/15 1042           Increase Aerobic Exercise and Physical Activity   Goals Progress/Improvement seen  No       Comments Ex. Rx. will be re-evaluated due to extended absence          Nutrition & Weight - Outcomes:     Pre Biometrics - 06/05/15 1443    Pre Biometrics   Height 5' 6.5" (1.689 m)   Weight 190 lb 6.4 oz (86.365 kg)   Waist Circumference 43.5 inches   Hip Circumference 47 inches   Waist to Hip Ratio 0.93 %   BMI (Calculated) 30.3       Nutrition:     Nutrition Therapy & Goals - 06/06/15 1512    Nutrition Therapy   Drug/Food Interactions Statins/Certain Fruits;Coumadin/Vit K   Intervention Plan   Intervention Using nutrition  plan and personal goals to gain a healthy nutrition lifestyle. Add exercise as prescribed.      Nutrition Discharge:     Rate Your Plate - 28/83/37 4451    Rate Your Plate Scores   Pre Score 72   Pre Score % 80 %      Education Questionnaire Score:     Knowledge Questionnaire Score - 06/06/15 1504    Knowledge Questionnaire Score   Pre Score 20      Goals reviewed with patient; copy given to patient.

## 2015-10-10 NOTE — Addendum Note (Signed)
Addended by: Lynford Humphrey on: 10/10/2015 12:50 PM   Modules accepted: Orders

## 2015-10-15 DIAGNOSIS — Z7901 Long term (current) use of anticoagulants: Secondary | ICD-10-CM | POA: Diagnosis not present

## 2015-10-21 DIAGNOSIS — I10 Essential (primary) hypertension: Secondary | ICD-10-CM | POA: Diagnosis not present

## 2015-10-21 DIAGNOSIS — I2510 Atherosclerotic heart disease of native coronary artery without angina pectoris: Secondary | ICD-10-CM | POA: Diagnosis not present

## 2015-10-21 DIAGNOSIS — I480 Paroxysmal atrial fibrillation: Secondary | ICD-10-CM | POA: Diagnosis not present

## 2015-10-21 DIAGNOSIS — E782 Mixed hyperlipidemia: Secondary | ICD-10-CM | POA: Diagnosis not present

## 2015-10-28 ENCOUNTER — Encounter
Admission: RE | Admit: 2015-10-28 | Discharge: 2015-10-28 | Disposition: A | Discharge status: Home / Self Care | Payer: Medicare Other | Source: Ambulatory Visit | Attending: Orthopedic Surgery | Admitting: Orthopedic Surgery

## 2015-10-28 DIAGNOSIS — I509 Heart failure, unspecified: Secondary | ICD-10-CM | POA: Insufficient documentation

## 2015-10-28 DIAGNOSIS — I4891 Unspecified atrial fibrillation: Secondary | ICD-10-CM | POA: Insufficient documentation

## 2015-10-28 DIAGNOSIS — E785 Hyperlipidemia, unspecified: Secondary | ICD-10-CM | POA: Insufficient documentation

## 2015-10-28 DIAGNOSIS — Z01812 Encounter for preprocedural laboratory examination: Secondary | ICD-10-CM | POA: Insufficient documentation

## 2015-10-28 DIAGNOSIS — E119 Type 2 diabetes mellitus without complications: Secondary | ICD-10-CM | POA: Insufficient documentation

## 2015-10-28 DIAGNOSIS — I10 Essential (primary) hypertension: Secondary | ICD-10-CM | POA: Insufficient documentation

## 2015-10-28 HISTORY — DX: Unspecified atrial fibrillation: I48.91

## 2015-10-28 HISTORY — DX: Unspecified hereditary corneal dystrophies: H18.50

## 2015-10-28 HISTORY — DX: Atherosclerosis of renal artery: I70.1

## 2015-10-28 HISTORY — DX: Gout, unspecified: M10.9

## 2015-10-28 HISTORY — DX: Hyperlipidemia, unspecified: E78.5

## 2015-10-28 HISTORY — DX: Unspecified hereditary corneal dystrophies, unspecified eye: H18.509

## 2015-10-28 HISTORY — DX: Spinal stenosis, site unspecified: M48.00

## 2015-10-28 LAB — COMPREHENSIVE METABOLIC PANEL
ALBUMIN: 4.1 g/dL (ref 3.5–5.0)
ALK PHOS: 85 U/L (ref 38–126)
ALT: 26 U/L (ref 17–63)
ANION GAP: 9 (ref 5–15)
AST: 29 U/L (ref 15–41)
BILIRUBIN TOTAL: 0.5 mg/dL (ref 0.3–1.2)
BUN: 11 mg/dL (ref 6–20)
CALCIUM: 9.4 mg/dL (ref 8.9–10.3)
CO2: 30 mmol/L (ref 22–32)
Chloride: 100 mmol/L — ABNORMAL LOW (ref 101–111)
Creatinine, Ser: 0.86 mg/dL (ref 0.61–1.24)
GFR calc Af Amer: >60 mL/min (ref >60)
GFR calc non Af Amer: >60 mL/min (ref >60)
Glucose, Bld: 127 mg/dL — ABNORMAL HIGH (ref 65–99)
POTASSIUM: 3.6 mmol/L (ref 3.5–5.1)
SODIUM: 139 mmol/L (ref 135–145)
TOTAL PROTEIN: 7.9 g/dL (ref 6.5–8.1)

## 2015-10-28 LAB — TYPE AND SCREEN
ABO/RH(D): A POS
ANTIBODY SCREEN: NEGATIVE

## 2015-10-28 LAB — URINALYSIS COMPLETE WITH MICROSCOPIC (ARMC ONLY)
Bacteria, UA: NONE SEEN
Bilirubin Urine: NEGATIVE
GLUCOSE, UA: NEGATIVE mg/dL
Hgb urine dipstick: NEGATIVE
KETONES UR: NEGATIVE mg/dL
Leukocytes, UA: NEGATIVE
Nitrite: NEGATIVE
Protein, ur: NEGATIVE mg/dL
SPECIFIC GRAVITY, URINE: 1.006 (ref 1.005–1.030)
SQUAMOUS EPITHELIAL / LPF: NONE SEEN
pH: 5 (ref 5.0–8.0)

## 2015-10-28 LAB — CBC
HEMATOCRIT: 41.2 % (ref 40.0–52.0)
HEMOGLOBIN: 14.1 g/dL (ref 13.0–18.0)
MCH: 31.6 pg (ref 26.0–34.0)
MCHC: 34.1 g/dL (ref 32.0–36.0)
MCV: 92.6 fL (ref 80.0–100.0)
Platelets: 243 10*3/uL (ref 150–440)
RBC: 4.45 MIL/uL (ref 4.40–5.90)
RDW: 15.6 % — ABNORMAL HIGH (ref 11.5–14.5)
WBC: 7.6 10*3/uL (ref 3.8–10.6)

## 2015-10-28 LAB — SEDIMENTATION RATE: Sed Rate: 32 mm/hr — ABNORMAL HIGH (ref 0–20)

## 2015-10-28 LAB — PROTIME-INR
INR: 1.98
PROTHROMBIN TIME: 22.4 s — AB (ref 11.4–15.0)

## 2015-10-28 LAB — SURGICAL PCR SCREEN
MRSA, PCR: NEGATIVE
STAPHYLOCOCCUS AUREUS: NEGATIVE

## 2015-10-28 LAB — APTT: APTT: 30 s (ref 24–36)

## 2015-10-28 LAB — ABO/RH: ABO/RH(D): A POS

## 2015-10-28 NOTE — Patient Instructions (Addendum)
  Your procedure is scheduled on: 11/11/15 Report to Day Surgery. To find out your arrival time please call [phone] between 1PM - 3PM on 11/10/15.  Remember: Instructions that are not followed completely may result in serious medical risk, up to and including death, or upon the discretion of your surgeon and anesthesiologist your surgery may need to be rescheduled.    __x__ 1. Do not eat food or drink liquids after midnight. No gum chewing or hard candies.     __x__ 2. No Alcohol for 24 hours before or after surgery.   ____ 3. Bring all medications with you on the day of surgery if instructed.    __x__ 4. Notify your doctor if there is any change in your medical condition     (cold, fever, infections).     Do not wear jewelry, make-up, hairpins, clips or nail polish.  Do not wear lotions, powders, or perfumes. You may wear deodorant.  Do not shave 48 hours prior to surgery. Men may shave face and neck.  Do not bring valuables to the hospital.    [facility] is not responsible for any belongings or valuables.               Contacts, dentures or bridgework may not be worn into surgery.  Leave your suitcase in the car. After surgery it may be brought to your room.  For patients admitted to the hospital, discharge time is determined by your                treatment team.   Patients discharged the day of surgery will not be allowed to drive home.   Please read over the following fact sheets that you were given:   MRSA Information and Surgical Site Infection Prevention   ____ Take these medicines the morning of surgery with A SIP OF WATER:    1. carvedilol  2. gabapentin  3. lyrica  4. ompeprazole  5. isosorbide  6. Pain med  ____ Fleet Enema (as directed)   __x__ Use CHG Soap as directed  ____ Use inhalers on the day of surgery  __x__ Stop metformin 2 days prior to surgery last dose on 2/25    __x__ Take 1/2 of usual insulin dose the night before surgery and none on the  morning of surgery.   __x__ Stop Coumadin/Plavix/aspirin on      Coumadin last dose on 2/22    Continue aspirin Stop Plavix__________     ____  Stop Anti-inflammatories on    __x_ Stop supplements on 2/21  ____ Bring C-Pap to the hospital.

## 2015-10-29 LAB — URINE CULTURE
CULTURE: NO GROWTH
Special Requests: NORMAL

## 2015-10-31 DIAGNOSIS — M1612 Unilateral primary osteoarthritis, left hip: Secondary | ICD-10-CM | POA: Diagnosis not present

## 2015-11-03 DIAGNOSIS — N182 Chronic kidney disease, stage 2 (mild): Secondary | ICD-10-CM | POA: Diagnosis not present

## 2015-11-03 DIAGNOSIS — E1122 Type 2 diabetes mellitus with diabetic chronic kidney disease: Secondary | ICD-10-CM | POA: Diagnosis not present

## 2015-11-04 DIAGNOSIS — E782 Mixed hyperlipidemia: Secondary | ICD-10-CM | POA: Diagnosis not present

## 2015-11-04 DIAGNOSIS — Z794 Long term (current) use of insulin: Secondary | ICD-10-CM | POA: Diagnosis not present

## 2015-11-04 DIAGNOSIS — E1142 Type 2 diabetes mellitus with diabetic polyneuropathy: Secondary | ICD-10-CM | POA: Diagnosis not present

## 2015-11-11 ENCOUNTER — Inpatient Hospital Stay: Payer: Medicare Other | Admitting: Anesthesiology

## 2015-11-11 ENCOUNTER — Encounter: Payer: Self-pay | Admitting: *Deleted

## 2015-11-11 ENCOUNTER — Inpatient Hospital Stay: Payer: Medicare Other

## 2015-11-11 ENCOUNTER — Inpatient Hospital Stay
Admission: RE | Admit: 2015-11-11 | Discharge: 2015-11-14 | DRG: 470 | Disposition: A | Discharge status: Skilled Nursing Facility | Payer: Medicare Other | Source: Ambulatory Visit | Attending: Orthopedic Surgery | Admitting: Orthopedic Surgery

## 2015-11-11 ENCOUNTER — Encounter
Admission: RE | Disposition: A | Discharge status: Skilled Nursing Facility | Payer: Self-pay | Source: Ambulatory Visit | Attending: Orthopedic Surgery

## 2015-11-11 DIAGNOSIS — E119 Type 2 diabetes mellitus without complications: Secondary | ICD-10-CM | POA: Diagnosis not present

## 2015-11-11 DIAGNOSIS — I252 Old myocardial infarction: Secondary | ICD-10-CM | POA: Diagnosis not present

## 2015-11-11 DIAGNOSIS — J189 Pneumonia, unspecified organism: Secondary | ICD-10-CM | POA: Diagnosis not present

## 2015-11-11 DIAGNOSIS — Z794 Long term (current) use of insulin: Secondary | ICD-10-CM

## 2015-11-11 DIAGNOSIS — M6281 Muscle weakness (generalized): Secondary | ICD-10-CM | POA: Diagnosis not present

## 2015-11-11 DIAGNOSIS — K219 Gastro-esophageal reflux disease without esophagitis: Secondary | ICD-10-CM | POA: Diagnosis present

## 2015-11-11 DIAGNOSIS — Z981 Arthrodesis status: Secondary | ICD-10-CM | POA: Diagnosis not present

## 2015-11-11 DIAGNOSIS — E1151 Type 2 diabetes mellitus with diabetic peripheral angiopathy without gangrene: Secondary | ICD-10-CM | POA: Diagnosis present

## 2015-11-11 DIAGNOSIS — I10 Essential (primary) hypertension: Secondary | ICD-10-CM | POA: Diagnosis present

## 2015-11-11 DIAGNOSIS — Z7984 Long term (current) use of oral hypoglycemic drugs: Secondary | ICD-10-CM

## 2015-11-11 DIAGNOSIS — Z79899 Other long term (current) drug therapy: Secondary | ICD-10-CM

## 2015-11-11 DIAGNOSIS — I4891 Unspecified atrial fibrillation: Secondary | ICD-10-CM | POA: Diagnosis present

## 2015-11-11 DIAGNOSIS — M159 Polyosteoarthritis, unspecified: Secondary | ICD-10-CM | POA: Diagnosis present

## 2015-11-11 DIAGNOSIS — Z8673 Personal history of transient ischemic attack (TIA), and cerebral infarction without residual deficits: Secondary | ICD-10-CM | POA: Diagnosis not present

## 2015-11-11 DIAGNOSIS — I2510 Atherosclerotic heart disease of native coronary artery without angina pectoris: Secondary | ICD-10-CM | POA: Diagnosis present

## 2015-11-11 DIAGNOSIS — M4800 Spinal stenosis, site unspecified: Secondary | ICD-10-CM | POA: Diagnosis present

## 2015-11-11 DIAGNOSIS — Z7982 Long term (current) use of aspirin: Secondary | ICD-10-CM | POA: Diagnosis not present

## 2015-11-11 DIAGNOSIS — E1142 Type 2 diabetes mellitus with diabetic polyneuropathy: Secondary | ICD-10-CM | POA: Diagnosis present

## 2015-11-11 DIAGNOSIS — H1850 Unspecified hereditary corneal dystrophies: Secondary | ICD-10-CM | POA: Diagnosis present

## 2015-11-11 DIAGNOSIS — R339 Retention of urine, unspecified: Secondary | ICD-10-CM | POA: Diagnosis not present

## 2015-11-11 DIAGNOSIS — I701 Atherosclerosis of renal artery: Secondary | ICD-10-CM | POA: Diagnosis present

## 2015-11-11 DIAGNOSIS — I509 Heart failure, unspecified: Secondary | ICD-10-CM | POA: Diagnosis present

## 2015-11-11 DIAGNOSIS — G8918 Other acute postprocedural pain: Secondary | ICD-10-CM

## 2015-11-11 DIAGNOSIS — Z471 Aftercare following joint replacement surgery: Secondary | ICD-10-CM | POA: Diagnosis not present

## 2015-11-11 DIAGNOSIS — Z96642 Presence of left artificial hip joint: Secondary | ICD-10-CM | POA: Diagnosis not present

## 2015-11-11 DIAGNOSIS — M1612 Unilateral primary osteoarthritis, left hip: Principal | ICD-10-CM | POA: Diagnosis present

## 2015-11-11 DIAGNOSIS — M109 Gout, unspecified: Secondary | ICD-10-CM | POA: Diagnosis not present

## 2015-11-11 DIAGNOSIS — N401 Enlarged prostate with lower urinary tract symptoms: Secondary | ICD-10-CM | POA: Diagnosis not present

## 2015-11-11 DIAGNOSIS — Z419 Encounter for procedure for purposes other than remedying health state, unspecified: Secondary | ICD-10-CM

## 2015-11-11 DIAGNOSIS — Z951 Presence of aortocoronary bypass graft: Secondary | ICD-10-CM | POA: Diagnosis not present

## 2015-11-11 DIAGNOSIS — E785 Hyperlipidemia, unspecified: Secondary | ICD-10-CM | POA: Diagnosis present

## 2015-11-11 DIAGNOSIS — Z4789 Encounter for other orthopedic aftercare: Secondary | ICD-10-CM | POA: Diagnosis not present

## 2015-11-11 DIAGNOSIS — M1990 Unspecified osteoarthritis, unspecified site: Secondary | ICD-10-CM | POA: Diagnosis not present

## 2015-11-11 HISTORY — PX: TOTAL HIP ARTHROPLASTY: SHX124

## 2015-11-11 LAB — CBC
HCT: 34.3 % — ABNORMAL LOW (ref 40.0–52.0)
HEMOGLOBIN: 11.7 g/dL — AB (ref 13.0–18.0)
MCH: 31.8 pg (ref 26.0–34.0)
MCHC: 34.2 g/dL (ref 32.0–36.0)
MCV: 93.1 fL (ref 80.0–100.0)
Platelets: 169 10*3/uL (ref 150–440)
RBC: 3.69 MIL/uL — ABNORMAL LOW (ref 4.40–5.90)
RDW: 15.8 % — ABNORMAL HIGH (ref 11.5–14.5)
WBC: 10.8 10*3/uL — ABNORMAL HIGH (ref 3.8–10.6)

## 2015-11-11 LAB — PROTIME-INR
INR: 1
Prothrombin Time: 13.4 seconds (ref 11.4–15.0)

## 2015-11-11 LAB — GLUCOSE, CAPILLARY
GLUCOSE-CAPILLARY: 190 mg/dL — AB (ref 65–99)
GLUCOSE-CAPILLARY: 210 mg/dL — AB (ref 65–99)
Glucose-Capillary: 148 mg/dL — ABNORMAL HIGH (ref 65–99)
Glucose-Capillary: 160 mg/dL — ABNORMAL HIGH (ref 65–99)

## 2015-11-11 SURGERY — ARTHROPLASTY, HIP, TOTAL, ANTERIOR APPROACH
Anesthesia: Spinal | Site: Hip | Laterality: Left | Wound class: Clean

## 2015-11-11 MED ORDER — METOCLOPRAMIDE HCL 5 MG/ML IJ SOLN: 5.0000 mg | Freq: Three times a day (TID) | INTRAMUSCULAR | Status: DC | PRN

## 2015-11-11 MED ORDER — PHENYLEPHRINE HCL 10 MG/ML IJ SOLN
INTRAMUSCULAR | Status: DC | PRN
  Administered 2015-11-11: 100 ug via INTRAVENOUS

## 2015-11-11 MED ORDER — CEFAZOLIN SODIUM-DEXTROSE 2-3 GM-% IV SOLR
2.0000 g | Freq: Four times a day (QID) | INTRAVENOUS | Status: AC
  Administered 2015-11-11 – 2015-11-12 (×3): 2 g via INTRAVENOUS
  Filled 2015-11-11 (×3): qty 50

## 2015-11-11 MED ORDER — BUPIVACAINE LIPOSOME 1.3 % IJ SUSP
INTRAMUSCULAR | Status: AC
  Filled 2015-11-11: qty 20

## 2015-11-11 MED ORDER — PRAVASTATIN SODIUM 20 MG PO TABS: 40.0000 mg | ORAL_TABLET | Freq: Every day | ORAL | Status: DC

## 2015-11-11 MED ORDER — ADULT MULTIVITAMIN W/MINERALS CH
1.0000 | ORAL_TABLET | Freq: Every day | ORAL | Status: DC
  Administered 2015-11-12 – 2015-11-14 (×3): 1 via ORAL
  Filled 2015-11-11 (×3): qty 1

## 2015-11-11 MED ORDER — ONDANSETRON HCL 4 MG PO TABS: 4.0000 mg | ORAL_TABLET | Freq: Four times a day (QID) | ORAL | Status: DC | PRN

## 2015-11-11 MED ORDER — AMLODIPINE BESYLATE 10 MG PO TABS
10.0000 mg | ORAL_TABLET | Freq: Every day | ORAL | Status: DC
  Administered 2015-11-11 – 2015-11-13 (×3): 10 mg via ORAL
  Filled 2015-11-11 (×4): qty 1

## 2015-11-11 MED ORDER — PROPOFOL 500 MG/50ML IV EMUL
INTRAVENOUS | Status: DC | PRN
  Administered 2015-11-11: 50 ug/kg/min via INTRAVENOUS

## 2015-11-11 MED ORDER — ACETAMINOPHEN 325 MG PO TABS
650.0000 mg | ORAL_TABLET | Freq: Four times a day (QID) | ORAL | Status: DC | PRN
  Administered 2015-11-13: 650 mg via ORAL
  Filled 2015-11-11 (×2): qty 2

## 2015-11-11 MED ORDER — TAMSULOSIN HCL 0.4 MG PO CAPS
0.4000 mg | ORAL_CAPSULE | Freq: Every day | ORAL | Status: DC
  Administered 2015-11-12 – 2015-11-13 (×2): 0.4 mg via ORAL
  Filled 2015-11-11 (×5): qty 1

## 2015-11-11 MED ORDER — FUROSEMIDE 20 MG PO TABS
20.0000 mg | ORAL_TABLET | Freq: Every day | ORAL | Status: DC
  Administered 2015-11-12 – 2015-11-14 (×3): 20 mg via ORAL
  Filled 2015-11-11 (×3): qty 1

## 2015-11-11 MED ORDER — CARVEDILOL 25 MG PO TABS: 25.0000 mg | ORAL_TABLET | Freq: Two times a day (BID) | ORAL | Status: DC

## 2015-11-11 MED ORDER — OXYCODONE HCL 5 MG PO TABS
5.0000 mg | ORAL_TABLET | ORAL | Status: DC | PRN
  Administered 2015-11-11: 5 mg via ORAL
  Administered 2015-11-11 – 2015-11-12 (×2): 10 mg via ORAL
  Administered 2015-11-12: 5 mg via ORAL
  Administered 2015-11-12 (×2): 10 mg via ORAL
  Administered 2015-11-13 (×2): 5 mg via ORAL
  Administered 2015-11-13: 10 mg via ORAL
  Administered 2015-11-14: 5 mg via ORAL
  Filled 2015-11-11 (×2): qty 2
  Filled 2015-11-11 (×2): qty 1
  Filled 2015-11-11: qty 2
  Filled 2015-11-11 (×4): qty 1
  Filled 2015-11-11 (×2): qty 2

## 2015-11-11 MED ORDER — GLYCOPYRROLATE 0.2 MG/ML IJ SOLN
INTRAMUSCULAR | Status: DC | PRN
  Administered 2015-11-11: 0.2 mg via INTRAVENOUS

## 2015-11-11 MED ORDER — BUPIVACAINE-EPINEPHRINE (PF) 0.25% -1:200000 IJ SOLN
INTRAMUSCULAR | Status: AC
  Filled 2015-11-11: qty 30

## 2015-11-11 MED ORDER — VITAMIN C 500 MG PO TABS
1000.0000 mg | ORAL_TABLET | Freq: Every day | ORAL | Status: DC
  Administered 2015-11-12 – 2015-11-13 (×2): 1000 mg via ORAL
  Filled 2015-11-11 (×5): qty 2

## 2015-11-11 MED ORDER — BUPIVACAINE-EPINEPHRINE 0.25% -1:200000 IJ SOLN
INTRAMUSCULAR | Status: DC | PRN
  Administered 2015-11-11: 30 mL

## 2015-11-11 MED ORDER — ACETAMINOPHEN 650 MG RE SUPP: 650.0000 mg | Freq: Four times a day (QID) | RECTAL | Status: DC | PRN

## 2015-11-11 MED ORDER — WARFARIN SODIUM 3 MG PO TABS: 3.0000 mg | ORAL_TABLET | Freq: Once | ORAL | Status: DC

## 2015-11-11 MED ORDER — SODIUM CHLORIDE 0.9 % IV SOLN
INTRAVENOUS | Status: DC
  Administered 2015-11-11 (×2): via INTRAVENOUS

## 2015-11-11 MED ORDER — ALUM & MAG HYDROXIDE-SIMETH 200-200-20 MG/5ML PO SUSP: 30.0000 mL | ORAL | Status: DC | PRN

## 2015-11-11 MED ORDER — ISOSORBIDE MONONITRATE ER 60 MG PO TB24
120.0000 mg | ORAL_TABLET | Freq: Every day | ORAL | Status: DC
  Administered 2015-11-12 – 2015-11-14 (×3): 120 mg via ORAL
  Filled 2015-11-11 (×3): qty 2

## 2015-11-11 MED ORDER — HYDRALAZINE HCL 50 MG PO TABS
100.0000 mg | ORAL_TABLET | Freq: Three times a day (TID) | ORAL | Status: DC
  Administered 2015-11-11 – 2015-11-14 (×9): 100 mg via ORAL
  Filled 2015-11-11: qty 2
  Filled 2015-11-11: qty 1
  Filled 2015-11-11 (×6): qty 2
  Filled 2015-11-11: qty 1
  Filled 2015-11-11 (×2): qty 2

## 2015-11-11 MED ORDER — DICYCLOMINE HCL 10 MG PO CAPS
10.0000 mg | ORAL_CAPSULE | Freq: Four times a day (QID) | ORAL | Status: DC
  Administered 2015-11-11 – 2015-11-14 (×11): 10 mg via ORAL
  Filled 2015-11-11 (×12): qty 1

## 2015-11-11 MED ORDER — SODIUM CHLORIDE FLUSH 0.9 % IV SOLN
INTRAVENOUS | Status: AC
Start: 2015-11-11 — End: 2015-11-11
  Filled 2015-11-11: qty 10

## 2015-11-11 MED ORDER — FENTANYL CITRATE (PF) 100 MCG/2ML IJ SOLN: 25.0000 ug | INTRAMUSCULAR | Status: DC | PRN

## 2015-11-11 MED ORDER — ONDANSETRON HCL 4 MG/2ML IJ SOLN: 4.0000 mg | Freq: Once | INTRAMUSCULAR | Status: DC | PRN

## 2015-11-11 MED ORDER — BUPIVACAINE HCL (PF) 0.5 % IJ SOLN
INTRAMUSCULAR | Status: DC | PRN
  Administered 2015-11-11: 3 mL via INTRATHECAL

## 2015-11-11 MED ORDER — INSULIN ASPART PROT & ASPART (70-30 MIX) 100 UNIT/ML ~~LOC~~ SUSP
30.0000 [IU] | Freq: Every day | SUBCUTANEOUS | Status: DC
  Administered 2015-11-12 – 2015-11-14 (×3): 30 [IU] via SUBCUTANEOUS
  Filled 2015-11-11 (×3): qty 30

## 2015-11-11 MED ORDER — LIDOCAINE HCL (PF) 2 % IJ SOLN
INTRAMUSCULAR | Status: DC | PRN
  Administered 2015-11-11: 50 mg

## 2015-11-11 MED ORDER — PANTOPRAZOLE SODIUM 40 MG PO TBEC
80.0000 mg | DELAYED_RELEASE_TABLET | Freq: Every day | ORAL | Status: DC
  Administered 2015-11-12 – 2015-11-14 (×3): 80 mg via ORAL
  Filled 2015-11-11 (×3): qty 2

## 2015-11-11 MED ORDER — VITAMIN E 180 MG (400 UNIT) PO CAPS
400.0000 [IU] | ORAL_CAPSULE | Freq: Two times a day (BID) | ORAL | Status: DC
  Administered 2015-11-11 – 2015-11-14 (×6): 400 [IU] via ORAL
  Filled 2015-11-11 (×7): qty 1

## 2015-11-11 MED ORDER — ONDANSETRON HCL 4 MG/2ML IJ SOLN: 4.0000 mg | Freq: Four times a day (QID) | INTRAMUSCULAR | Status: DC | PRN

## 2015-11-11 MED ORDER — NEOMYCIN-POLYMYXIN B GU 40-200000 IR SOLN
Status: DC | PRN
  Administered 2015-11-11: 4 mL

## 2015-11-11 MED ORDER — EPHEDRINE SULFATE 50 MG/ML IJ SOLN
INTRAMUSCULAR | Status: DC | PRN
  Administered 2015-11-11: 10 mg via INTRAVENOUS

## 2015-11-11 MED ORDER — CLOPIDOGREL BISULFATE 75 MG PO TABS
75.0000 mg | ORAL_TABLET | Freq: Every day | ORAL | Status: DC
  Administered 2015-11-12 – 2015-11-14 (×3): 75 mg via ORAL
  Filled 2015-11-11 (×3): qty 1

## 2015-11-11 MED ORDER — AMLODIPINE BESYLATE 10 MG PO TABS: 10.0000 mg | ORAL_TABLET | Freq: Every day | ORAL | Status: DC

## 2015-11-11 MED ORDER — CEFAZOLIN SODIUM-DEXTROSE 2-3 GM-% IV SOLR
2.0000 g | Freq: Once | INTRAVENOUS | Status: AC
  Administered 2015-11-11: 2 g via INTRAVENOUS

## 2015-11-11 MED ORDER — KETAMINE HCL 10 MG/ML IJ SOLN
INTRAMUSCULAR | Status: DC | PRN
  Administered 2015-11-11: 20 mg via INTRAVENOUS

## 2015-11-11 MED ORDER — INSULIN LISPRO PROT & LISPRO (75-25 MIX) 100 UNIT/ML KWIKPEN: 30.0000 [IU] | PEN_INJECTOR | Freq: Two times a day (BID) | SUBCUTANEOUS | Status: DC

## 2015-11-11 MED ORDER — SODIUM CHLORIDE 0.9 % IV SOLN
INTRAVENOUS | Status: DC
  Administered 2015-11-11 – 2015-11-12 (×2): via INTRAVENOUS

## 2015-11-11 MED ORDER — TRANEXAMIC ACID 1000 MG/10ML IV SOLN
1000.0000 mg | INTRAVENOUS | Status: DC | PRN
  Administered 2015-11-11: 1000 mg via INTRAVENOUS

## 2015-11-11 MED ORDER — CARVEDILOL 25 MG PO TABS
25.0000 mg | ORAL_TABLET | Freq: Two times a day (BID) | ORAL | Status: DC
  Administered 2015-11-11 – 2015-11-14 (×6): 25 mg via ORAL
  Filled 2015-11-11 (×8): qty 1

## 2015-11-11 MED ORDER — PREGABALIN 75 MG PO CAPS
75.0000 mg | ORAL_CAPSULE | Freq: Two times a day (BID) | ORAL | Status: DC
  Administered 2015-11-11 – 2015-11-14 (×7): 75 mg via ORAL
  Filled 2015-11-11 (×7): qty 1

## 2015-11-11 MED ORDER — POTASSIUM CHLORIDE CRYS ER 10 MEQ PO TBCR
10.0000 meq | EXTENDED_RELEASE_TABLET | Freq: Two times a day (BID) | ORAL | Status: DC
  Administered 2015-11-11 – 2015-11-14 (×6): 10 meq via ORAL
  Filled 2015-11-11 (×6): qty 1

## 2015-11-11 MED ORDER — SODIUM CHLORIDE 0.9 % IJ SOLN
INTRAMUSCULAR | Status: AC
  Filled 2015-11-11: qty 50

## 2015-11-11 MED ORDER — PRAVASTATIN SODIUM 20 MG PO TABS
40.0000 mg | ORAL_TABLET | ORAL | Status: DC
  Administered 2015-11-11 – 2015-11-13 (×3): 40 mg via ORAL
  Filled 2015-11-11 (×4): qty 2

## 2015-11-11 MED ORDER — INSULIN ASPART 100 UNIT/ML ~~LOC~~ SOLN
0.0000 [IU] | Freq: Three times a day (TID) | SUBCUTANEOUS | Status: DC
  Administered 2015-11-11: 5 [IU] via SUBCUTANEOUS
  Administered 2015-11-12: 3 [IU] via SUBCUTANEOUS
  Administered 2015-11-12: 5 [IU] via SUBCUTANEOUS
  Administered 2015-11-12 – 2015-11-14 (×4): 3 [IU] via SUBCUTANEOUS
  Filled 2015-11-11: qty 3
  Filled 2015-11-11: qty 5
  Filled 2015-11-11: qty 3
  Filled 2015-11-11 (×2): qty 5
  Filled 2015-11-11: qty 3
  Filled 2015-11-11: qty 2
  Filled 2015-11-11: qty 3

## 2015-11-11 MED ORDER — OMEGA-3-ACID ETHYL ESTERS 1 G PO CAPS
2.0000 g | ORAL_CAPSULE | Freq: Every day | ORAL | Status: DC
  Administered 2015-11-12: 2 g via ORAL
  Filled 2015-11-11 (×3): qty 2

## 2015-11-11 MED ORDER — MENTHOL 3 MG MT LOZG
1.0000 | LOZENGE | OROMUCOSAL | Status: DC | PRN
  Filled 2015-11-11: qty 9

## 2015-11-11 MED ORDER — TRANEXAMIC ACID 1000 MG/10ML IV SOLN
INTRAVENOUS | Status: AC
  Filled 2015-11-11: qty 10

## 2015-11-11 MED ORDER — ASPIRIN EC 81 MG PO TBEC
81.0000 mg | DELAYED_RELEASE_TABLET | Freq: Every day | ORAL | Status: DC
  Administered 2015-11-11 – 2015-11-14 (×4): 81 mg via ORAL
  Filled 2015-11-11 (×4): qty 1

## 2015-11-11 MED ORDER — WARFARIN SODIUM 2 MG PO TABS
4.0000 mg | ORAL_TABLET | Freq: Once | ORAL | Status: AC
  Administered 2015-11-11: 4 mg via ORAL
  Filled 2015-11-11: qty 2

## 2015-11-11 MED ORDER — RISAQUAD PO CAPS
1.0000 | ORAL_CAPSULE | Freq: Every day | ORAL | Status: DC
  Administered 2015-11-12 – 2015-11-14 (×3): 1 via ORAL
  Filled 2015-11-11 (×3): qty 1

## 2015-11-11 MED ORDER — MIDAZOLAM HCL 5 MG/5ML IJ SOLN
INTRAMUSCULAR | Status: DC | PRN
  Administered 2015-11-11: 2 mg via INTRAVENOUS

## 2015-11-11 MED ORDER — FUROSEMIDE 20 MG PO TABS: 20.0000 mg | ORAL_TABLET | Freq: Every day | ORAL | Status: DC

## 2015-11-11 MED ORDER — GABAPENTIN 300 MG PO CAPS
300.0000 mg | ORAL_CAPSULE | Freq: Three times a day (TID) | ORAL | Status: DC
  Administered 2015-11-11 – 2015-11-14 (×9): 300 mg via ORAL
  Filled 2015-11-11 (×9): qty 1

## 2015-11-11 MED ORDER — COLCHICINE 0.6 MG PO TABS
0.6000 mg | ORAL_TABLET | Freq: Every day | ORAL | Status: DC
  Administered 2015-11-12 – 2015-11-14 (×3): 0.6 mg via ORAL
  Filled 2015-11-11 (×3): qty 1

## 2015-11-11 MED ORDER — PHENOL 1.4 % MT LIQD
1.0000 | OROMUCOSAL | Status: DC | PRN
  Filled 2015-11-11: qty 177

## 2015-11-11 MED ORDER — METOCLOPRAMIDE HCL 5 MG PO TABS
5.0000 mg | ORAL_TABLET | Freq: Three times a day (TID) | ORAL | Status: DC | PRN
Start: 2015-11-11 — End: 2015-11-14

## 2015-11-11 MED ORDER — ASPIRIN EC 81 MG PO TBEC: 81.0000 mg | DELAYED_RELEASE_TABLET | Freq: Every day | ORAL | Status: DC

## 2015-11-11 MED ORDER — METFORMIN HCL 500 MG PO TABS
1000.0000 mg | ORAL_TABLET | Freq: Two times a day (BID) | ORAL | Status: DC
  Administered 2015-11-11 – 2015-11-14 (×6): 1000 mg via ORAL
  Filled 2015-11-11 (×6): qty 2

## 2015-11-11 MED ORDER — PROBENECID 500 MG PO TABS: 500.0000 mg | ORAL_TABLET | Freq: Two times a day (BID) | ORAL | Status: DC

## 2015-11-11 MED ORDER — NEOMYCIN-POLYMYXIN B GU 40-200000 IR SOLN
Status: AC
  Filled 2015-11-11: qty 4

## 2015-11-11 MED ORDER — MORPHINE SULFATE (PF) 2 MG/ML IV SOLN
2.0000 mg | INTRAVENOUS | Status: DC | PRN
  Administered 2015-11-11: 2 mg via INTRAVENOUS
  Filled 2015-11-11: qty 1

## 2015-11-11 MED ORDER — LINAGLIPTIN 5 MG PO TABS
5.0000 mg | ORAL_TABLET | Freq: Every day | ORAL | Status: DC
  Administered 2015-11-12 – 2015-11-14 (×3): 5 mg via ORAL
  Filled 2015-11-11 (×3): qty 1

## 2015-11-11 MED ORDER — WARFARIN SODIUM 2 MG PO TABS
1.0000 mg | ORAL_TABLET | Freq: Every day | ORAL | Status: DC
Start: 2015-11-11 — End: 2015-11-11

## 2015-11-11 MED ORDER — CEFAZOLIN SODIUM-DEXTROSE 2-3 GM-% IV SOLR
INTRAVENOUS | Status: AC
Start: 2015-11-11 — End: 2015-11-11
  Filled 2015-11-11: qty 50

## 2015-11-11 MED ORDER — SODIUM CHLORIDE 0.9 % IV SOLN
INTRAVENOUS | Status: DC | PRN
  Administered 2015-11-11: 60 mL

## 2015-11-11 MED ORDER — INSULIN ASPART PROT & ASPART (70-30 MIX) 100 UNIT/ML ~~LOC~~ SUSP
44.0000 [IU] | Freq: Every day | SUBCUTANEOUS | Status: DC
  Administered 2015-11-11 – 2015-11-12 (×2): 44 [IU] via SUBCUTANEOUS
  Filled 2015-11-11 (×3): qty 44

## 2015-11-11 SURGICAL SUPPLY — 44 items
BLADE SAW SAG 18.5X105 (BLADE) ×3 IMPLANT
BNDG COHESIVE 6X5 TAN STRL LF (GAUZE/BANDAGES/DRESSINGS) ×6 IMPLANT
CANISTER SUCT 1200ML W/VALVE (MISCELLANEOUS) ×3 IMPLANT
CAPT HIP TOTAL 3 ×3 IMPLANT
CATH FOL LEG HOLDER (MISCELLANEOUS) ×3 IMPLANT
CATH TRAY METER 16FR LF (MISCELLANEOUS) ×3 IMPLANT
CHLORAPREP W/TINT 26ML (MISCELLANEOUS) ×3 IMPLANT
DRAPE C-ARM XRAY 36X54 (DRAPES) ×3 IMPLANT
DRAPE INCISE IOBAN 66X60 STRL (DRAPES) IMPLANT
DRAPE POUCH INSTRU U-SHP 10X18 (DRAPES) ×3 IMPLANT
DRAPE SHEET LG 3/4 BI-LAMINATE (DRAPES) ×9 IMPLANT
DRAPE STERI IOBAN 125X83 (DRAPES) ×3 IMPLANT
DRAPE TABLE BACK 80X90 (DRAPES) ×3 IMPLANT
DRSG OPSITE POSTOP 4X8 (GAUZE/BANDAGES/DRESSINGS) ×6 IMPLANT
ELECT BLADE 6.5 EXT (BLADE) ×3 IMPLANT
GAUZE SPONGE 4X4 12PLY STRL (GAUZE/BANDAGES/DRESSINGS) ×3 IMPLANT
GLOVE BIOGEL PI IND STRL 9 (GLOVE) ×1 IMPLANT
GLOVE BIOGEL PI INDICATOR 9 (GLOVE) ×2
GLOVE SURG ORTHO 9.0 STRL STRW (GLOVE) ×3 IMPLANT
GOWN SPECIALTY ULTRA XL (MISCELLANEOUS) ×3 IMPLANT
GOWN STRL REUS W/ TWL LRG LVL3 (GOWN DISPOSABLE) ×1 IMPLANT
GOWN STRL REUS W/TWL LRG LVL3 (GOWN DISPOSABLE) ×2
HEMOVAC 400CC 10FR (MISCELLANEOUS) ×3 IMPLANT
HOOD PEEL AWAY FLYTE STAYCOOL (MISCELLANEOUS) ×3 IMPLANT
MAT BLUE FLOOR 46X72 FLO (MISCELLANEOUS) ×3 IMPLANT
NDL SAFETY 18GX1.5 (NEEDLE) ×3 IMPLANT
NEEDLE SPNL 18GX3.5 QUINCKE PK (NEEDLE) ×3 IMPLANT
NS IRRIG 1000ML POUR BTL (IV SOLUTION) ×3 IMPLANT
PACK HIP COMPR (MISCELLANEOUS) ×3 IMPLANT
SOL PREP PVP 2OZ (MISCELLANEOUS) ×3
SOLUTION PREP PVP 2OZ (MISCELLANEOUS) ×1 IMPLANT
STAPLER SKIN PROX 35W (STAPLE) ×3 IMPLANT
STRAP SAFETY BODY (MISCELLANEOUS) ×3 IMPLANT
SUT DVC 2 QUILL PDO  T11 36X36 (SUTURE) ×2
SUT DVC 2 QUILL PDO T11 36X36 (SUTURE) ×1 IMPLANT
SUT DVC QUILL MONODERM 30X30 (SUTURE) ×3 IMPLANT
SUT ETHIBOND NAB CT1 #1 30IN (SUTURE) ×3 IMPLANT
SUT SILK 0 (SUTURE) ×2
SUT SILK 0 30XBRD TIE 6 (SUTURE) ×1 IMPLANT
SUT VIC AB 1 CT1 36 (SUTURE) ×3 IMPLANT
SYR 20CC LL (SYRINGE) ×3 IMPLANT
SYR 30ML LL (SYRINGE) ×3 IMPLANT
TAPE MICROFOAM 4IN (TAPE) ×3 IMPLANT
TUBE KAMVAC SUCTION (TUBING) ×3 IMPLANT

## 2015-11-11 NOTE — Anesthesia Preprocedure Evaluation (Signed)
Anesthesia Evaluation  Patient identified by MRN, date of birth, ID band Patient awake    Reviewed: Allergy & Precautions, NPO status , Patient's Chart, lab work & pertinent test results  History of Anesthesia Complications Negative for: history of anesthetic complications  Airway Mallampati: III       Dental   Pulmonary neg pulmonary ROS,           Cardiovascular hypertension, Pt. on medications and Pt. on home beta blockers + Past MI, + Cardiac Stents, + CABG, + Peripheral Vascular Disease and +CHF       Neuro/Psych TIA (left eyelid droopy x months, no residual)negative neurological ROS     GI/Hepatic Neg liver ROS, GERD  Medicated and Controlled,  Endo/Other  diabetes, Oral Hypoglycemic Agents  Renal/GU Renal disease     Musculoskeletal  (+) Arthritis , Osteoarthritis,    Abdominal   Peds  Hematology   Anesthesia Other Findings   Reproductive/Obstetrics                             Anesthesia Physical Anesthesia Plan  ASA: III  Anesthesia Plan: Spinal   Post-op Pain Management:    Induction:   Airway Management Planned:   Additional Equipment:   Intra-op Plan:   Post-operative Plan:   Informed Consent: I have reviewed the patients History and Physical, chart, labs and discussed the procedure including the risks, benefits and alternatives for the proposed anesthesia with the patient or authorized representative who has indicated his/her understanding and acceptance.     Plan Discussed with:   Anesthesia Plan Comments:         Anesthesia Quick Evaluation

## 2015-11-11 NOTE — Anesthesia Procedure Notes (Signed)
Spinal Patient location during procedure: OR Staffing Anesthesiologist: Gunnar Fusi Resident/CRNA: Rolla Plate Performed by: resident/CRNA  Preanesthetic Checklist Completed: patient identified, site marked, surgical consent, pre-op evaluation, timeout performed, IV checked, risks and benefits discussed and monitors and equipment checked Spinal Block Patient position: sitting Prep: Betadine and site prepped and draped Patient monitoring: heart rate, continuous pulse ox, blood pressure and cardiac monitor Approach: midline Location: L3-4 Injection technique: single-shot Needle Needle type: Whitacre and Introducer  Needle gauge: 24 G Needle length: 9 cm Additional Notes Negative paresthesia. Negative blood return. Positive free-flowing CSF. Expiration date of kit checked and confirmed. Patient tolerated procedure well, without complications.

## 2015-11-11 NOTE — Op Note (Signed)
11/11/2015  10:03 AM  PATIENT:  Chase Cabrera  70 y.o. male  PRE-OPERATIVE DIAGNOSIS:  OSTEOARTHRITIS left hip  POST-OPERATIVE DIAGNOSIS:  OSTEOARTHRITIS left hip  PROCEDURE:  Procedure(s): TOTAL HIP ARTHROPLASTY ANTERIOR APPROACH (Left)  SURGEON: Laurene Footman, MD  ASSISTANTS: None  ANESTHESIA:   general  EBL:  Total I/O In: 1900 [I.V.:1900] Out: 600 [Urine:100; Blood:500]  BLOOD ADMINISTERED:none  DRAINS: (Medium) Hemovact drain(s) in the Subcutaneous layer with  Suction Open   LOCAL MEDICATIONS USED:  MARCAINE    and OTHER Exparel  SPECIMEN:  Source of Specimen:  Left femoral head  DISPOSITION OF SPECIMEN:  PATHOLOGY  COUNTS:  YES  TOURNIQUET:  * No tourniquets in log *  IMPLANTS: Medacta Amis 7 lateralized stem, 52 mm Mpact cup DM with liner and S 28 mm head  DICTATION: .Dragon Dictation   The patient was brought to the operating room and after general anesthesia was obtained patient was placed on the operative table with the ipsilateral foot into the Medacta attachment, contralateral leg on a well-padded table. C-arm was brought in and preop template x-ray taken. After prepping and draping in usual sterile fashion appropriate patient identification and timeout procedures were completed. Anterior approach to the hip was obtained and centered over the greater trochanter and TFL muscle. The subcutaneous tissue was incised hemostasis being achieved by electrocautery. TFL fascia was incised and the muscle retracted laterally deep retractor placed. The lateral femoral circumflex vessels were identified and ligated. The anterior capsule was exposed and a capsulotomy performed. The neck was identified and a femoral neck cut carried out with a saw. The head was removed without difficulty and showed mildly arthritic femoral head and extensive degenerative changes within the acetabulum. Reaming was carried out to 50 mm and a 52 mm cup trial gave appropriate tightness to the  acetabular component a 52 mm Mpact cup DM  was impacted into position. The leg was then externally rotated and ischiofemoral and pubofemoral releases carried out. The femur was sequentially broached to a size 7, size 7 femur lateralized neck S head trials were placed and the final components chosen. The 7 lateralized stem was inserted along with a S 28 mm head and 52 mm liner. The hip was reduced and was stable the wound was thoroughly irrigated with a dilute Betadine solution. The deep fascia was closed using a heavy Quill after infiltration of 30 cc of quarter percent Sensorcaine with epinephrine and dilute Exparel. Subcutaneous drains were then inserted, 2-0 Quill to close the skin with skin staples Xeroform drain sponge and honeycomb dressing  PLAN OF CARE: Admit to inpatient

## 2015-11-11 NOTE — H&P (Signed)
Reviewed paper H+P, will be scanned into chart. No changes noted.  

## 2015-11-11 NOTE — Progress Notes (Signed)
ANTICOAGULATION CONSULT NOTE - Initial Consult  Pharmacy Consult for warfarin Indication: VTE prophylaxis  Allergies  Allergen Reactions  . Allopurinol Diarrhea, Other (See Comments) and Nausea And Vomiting    Other Reaction: GI Upset  . Atenolol Other (See Comments)    Other Reaction: bradycardia  . Atorvastatin Other (See Comments)    Other Reaction: muscle aches  . Ramipril Rash  . Simvastatin Other (See Comments)    Other Reaction: MUSCLE ACHES (Zocor)  . Valsartan Other (See Comments)    Other Reaction: facial swelling  . Rosuvastatin Other (See Comments)    Muscle aches    Patient Measurements: Height: 5' 6"  (167.6 cm) Weight: 203 lb (92.08 kg) IBW/kg (Calculated) : 63.8  Vital Signs: Temp: 97.7 F (36.5 C) (02/28 1414) Temp Source: Oral (02/28 1414) BP: 127/61 mmHg (02/28 1414) Pulse Rate: 61 (02/28 1414)  Labs:  Recent Labs  11/11/15 0629 11/11/15 1252  HGB  --  11.7*  HCT  --  34.3*  PLT  --  169  LABPROT 13.4  --   INR 1.00  --     Estimated Creatinine Clearance: 83.7 mL/min (by C-G formula based on Cr of 0.86).  Assessment: Pharmacy consulted to dose warfarin in this 70 year old male who is post-op from total hip arthroplasty today. Patient was taking warfarin prior to admission for atrial fibrillation with a home dose of 4 mg daily.   INR today is subtherapeutic after surgery with an INR of 1  Goal of Therapy:  INR 2-3 Monitor platelets by anticoagulation protocol: Yes   Plan:  Give warfarin 4 mg dose tonight INR ordered with AM labs tomorrow  Lenis Noon, PharmD 11/11/2015,2:52 PM

## 2015-11-11 NOTE — Transfer of Care (Signed)
Immediate Anesthesia Transfer of Care Note  Patient: Chase Cabrera  Procedure(s) Performed: Procedure(s): TOTAL HIP ARTHROPLASTY ANTERIOR APPROACH (Left)  Patient Location: PACU  Anesthesia Type:Spinal  Level of Consciousness: awake  Airway & Oxygen Therapy: Patient Spontanous Breathing and Patient connected to face mask oxygen  Post-op Assessment: Report given to RN  Post vital signs: Reviewed  Last Vitals:  Filed Vitals:   11/11/15 0616 11/11/15 0957  BP: 110/55 98/57  Pulse: 59 55  Temp: 36.8 C 37.6 C  Resp: 14 18    Complications: No apparent anesthesia complications

## 2015-11-11 NOTE — Evaluation (Signed)
Physical Therapy Evaluation Patient Details Name: Chase Cabrera  MRN: [id number] DOB: February 20, 1946 Today's Date: 11/11/2015   History of Present Illness  Pt is admitted for L THR.   Clinical Impression  Pt is a pleasant 70 year old male who was admitted for L THR. Pt performs bed mobility with mod assist + 2 and transfers/ambulation with min assist + 2 and RW. Pt demonstrates deficits with strength/mobility/endurance/pain. Pt motivated to perform therapy this date. Would benefit from skilled PT to address above deficits and promote optimal return to PLOF.      Follow Up Recommendations SNF    Equipment Recommendations       Recommendations for Other Services       Precautions / Restrictions Precautions Precautions: Fall Restrictions Weight Bearing Restrictions: Yes LLE Weight Bearing: Weight bearing as tolerated      Mobility  Bed Mobility Overal bed mobility: Needs Assistance Bed Mobility: Supine to Sit     Supine to sit: Mod assist;+2 for physical assistance     General bed mobility comments: assist for sliding L LE off bed. +2 assist for trunk support. Once seated at EOB, pt able to sit with cga  Transfers Overall transfer level: Needs assistance Equipment used: Rolling walker (2 wheeled) Transfers: Sit to/from Stand Sit to Stand: Min assist;+2 physical assistance         General transfer comment: assist for upright posture. Cues required for hand placement prior to transfer  Ambulation/Gait Ambulation/Gait assistance: Min assist;+2 physical assistance Ambulation Distance (Feet): 3 Feet Assistive device: Rolling walker (2 wheeled) Gait Pattern/deviations: Step-to pattern     General Gait Details: ambulated with step to gait pattern. Pt with antalgic gait pattern, however able to follow commands for cues for sequencing.  Stairs            Wheelchair Mobility    Modified Rankin (Stroke Patients Only)       Balance Overall balance  assessment: Needs assistance Sitting-balance support: Feet supported Sitting balance-Leahy Scale: Good     Standing balance support: Bilateral upper extremity supported Standing balance-Leahy Scale: Good                               Pertinent Vitals/Pain Pain Assessment: 0-10 Pain Score: 3  Pain Location: L hip Pain Descriptors / Indicators: Operative site guarding Pain Intervention(s): Limited activity within patient's tolerance;Premedicated before session    Home Living Family/patient expects to be discharged to:: Private residence Living Arrangements: Spouse/significant other Available Help at Discharge: Family;Available 24 hours/day Type of Home: House Home Access: Ramped entrance     Home Layout: One level Home Equipment: Walker - 2 wheels;Electric scooter      Prior Function Level of Independence: Independent with assistive device(s)         Comments: uses electric scooter for majority of mobility, however was able to walk about 30-40' with rw at baseline     Hand Dominance        Extremity/Trunk Assessment   Upper Extremity Assessment: Overall WFL for tasks assessed           Lower Extremity Assessment: Generalized weakness (L LE grossly 3/5; R LE grossly 5/5)         Communication   Communication: No difficulties  Cognition Arousal/Alertness: Awake/alert Behavior During Therapy: WFL for tasks assessed/performed Overall Cognitive Status: Within Functional Limits for tasks assessed  General Comments      Exercises Other Exercises Other Exercises: L LE exercises performed x 10 reps including ankle pumps, quad sets, hip abd/add, and glut squeezes. All ther-ex performed x min assist      Assessment/Plan    PT Assessment Patient needs continued PT services  PT Diagnosis Difficulty walking;Generalized weakness;Acute pain   PT Problem List Decreased strength;Decreased range of motion;Decreased  activity tolerance;Decreased balance;Decreased mobility;Decreased knowledge of use of DME;Pain  PT Treatment Interventions Gait training;DME instruction;Therapeutic exercise   PT Goals (Current goals can be found in the Care Plan section) Acute Rehab PT Goals Patient Stated Goal: to get stronger PT Goal Formulation: With patient Time For Goal Achievement: 11/25/15 Potential to Achieve Goals: Good    Frequency BID   Barriers to discharge        Co-evaluation               End of Session Equipment Utilized During Treatment: Gait belt Activity Tolerance: Patient tolerated treatment well Patient left: in chair;with chair alarm set Nurse Communication: Mobility status         Time: 1595-3967 PT Time Calculation (min) (ACUTE ONLY): 29 min   Charges:   PT Evaluation $PT Eval Moderate Complexity: 1 Procedure PT Treatments $Therapeutic Exercise: 8-22 mins   PT G Codes:        Charolette Bultman 11/28/2015, 4:51 PM  Greggory Stallion, PT, DPT [phone]

## 2015-11-11 NOTE — NC FL2 (Signed)
[place] LEVEL OF CARE SCREENING TOOL     IDENTIFICATION  Patient Name: Chase Cabrera Birthdate: 1945-09-27 Sex: male Admission Date (Current Location): 11/11/2015  [place] and [place] Number:  Engineering geologist and Address:  [facility], [address], [place], [place] [address]      Provider Number: [id number]  Attending Physician Name and Address:  Hessie Knows, MD  Relative Name and Phone Number:       Current Level of Care: Hospital Recommended Level of Care: [place] Prior Approval Number:    Date Approved/Denied:   PASRR Number:  ( [id number] )  Discharge Plan: SNF    Current Diagnoses: Patient Active Problem List   Diagnosis Date Noted  . 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 First occurred post CABG in 1996. b. Second episode in 04/2007: Spontaneously converted to normal sinus rhythm. c. Third episode in 2011: Spontaneously converted. d. Fourth episode 07/2010: Spontaneously converted. e. Admitted on August 27, 2010 for atrial fibrillation and dofetilide loading but felt to be intolerant to dofetilide due to QT prolongation at a dose of 500 q12. f. Ablation, 2012.   Renal artery stenosis (CMS-HCC) 07/01/2011 S/p stent Right 3/10, has residual left RAS.   Anticoagulant long-term use 07/29/2011   Lumbar stenosis 02/24/2012   Nuclear sclerotic cataract of both eyes - Both Eyes 03/30/2012   Endothelial corneal dystrophy - Both Eyes 03/30/2012   Arthritis    Gout    Long-term insulin use in type 2 diabetes (CMS-HCC)    PUD (peptic ulcer disease)    Myocardial infarction (CMS-HCC)  x4  History of gout    History of shingles    Degenerative arthritis  multiple joints   Diabetic peripheral neuropathy (CMS-HCC)    Type 2 diabetes mellitus with neurologic complication (CMS-HCC) Diabetes diagnosed 2005   Renal artery stenosis (CMS-HCC)    Arrhythmia  afib  PAF (paroxysmal atrial fibrillation) (CMS-HCC       Orientation RESPIRATION BLADDER Height & Weight     Self, Time, Situation, Place  Normal Continent Weight: 203 lb (92.08 kg) Height:  5' 6"  (167.6 cm)  BEHAVIORAL SYMPTOMS/MOOD NEUROLOGICAL BOWEL NUTRITION STATUS   (none )  (none ) Continent Diet (Diet: Card Modified )  AMBULATORY STATUS COMMUNICATION OF NEEDS Skin   Extensive Assist Verbally Surgical wounds (Incision: Left Hip. )                       Personal Care Assistance Level of Assistance  Bathing, Feeding, Dressing Bathing Assistance: Limited assistance Feeding assistance: Independent Dressing Assistance: Limited assistance     Functional Limitations Info  Sight, Hearing, Speech Sight Info: Adequate Hearing Info: Adequate Speech Info: Adequate    SPECIAL CARE FACTORS FREQUENCY  PT (By licensed PT), OT (By licensed OT)     PT Frequency:  (5) OT Frequency:  (5)            Contractures      Additional Factors Info  Code Status, Allergies, Insulin Sliding Scale Code Status Info:  (Full Code. ) Allergies Info:  (Allopurinol, Atenolol, Atorvastatin, Ramipril, Simvastatin, Valsartan, Rosuvastatin)   Insulin Sliding Scale Info:  (NovoLog Insulin Injections 3 times daily. )       Current Medications (  11/11/2015):  This is the current hospital active medication list Current Facility-Administered Medications  Medication Dose Route Frequency Provider Last Rate Last Dose  . 0.9 %  sodium chloride infusion   Intravenous Continuous Hessie Knows, MD 75 mL/hr at 11/11/15 1208    . acetaminophen (TYLENOL) tablet 650 mg  650 mg Oral Q6H PRN Hessie Knows, MD       Or  . acetaminophen (TYLENOL) suppository 650 mg  650 mg Rectal Q6H PRN Hessie Knows, MD      . acidophilus  (RISAQUAD) capsule 1 capsule  1 capsule Oral Daily Lenis Noon, [facility]   1 capsule at 11/11/15 1333  . alum & mag hydroxide-simeth (MAALOX/MYLANTA) 200-200-20 MG/5ML suspension 30 mL  30 mL Oral Q4H PRN Hessie Knows, MD      . amLODipine (NORVASC) tablet 10 mg  10 mg Oral Daily Darylene Price Kreamer, [facility]      . aspirin EC tablet 81 mg  81 mg Oral Daily Lenis Noon, RPH      . carvedilol (COREG) tablet 25 mg  25 mg Oral BID WC Hessie Knows, MD      . ceFAZolin (ANCEF) 2-3 GM-% IVPB SOLR           . ceFAZolin (ANCEF) IVPB 2 g/50 mL premix  2 g Intravenous Q6H Hessie Knows, MD   2 g at 11/11/15 1340  . [START ON 11/12/2015] clopidogrel (PLAVIX) tablet 75 mg  75 mg Oral Daily Hessie Knows, MD      . colchicine tablet 0.6 mg  0.6 mg Oral Daily Hessie Knows, MD   0.6 mg at 11/11/15 1212  . dicyclomine (BENTYL) capsule 10 mg  10 mg Oral QID Hessie Knows, MD   10 mg at 11/11/15 1339  . furosemide (LASIX) tablet 20 mg  20 mg Oral Daily Lenis Noon, RPH   20 mg at 11/11/15 1333  . gabapentin (NEURONTIN) capsule 300 mg  300 mg Oral TID Hessie Knows, MD      . hydrALAZINE (APRESOLINE) tablet 100 mg  100 mg Oral TID Hessie Knows, MD   100 mg at 11/11/15 1214  . insulin aspart (novoLOG) injection 0-15 Units  0-15 Units Subcutaneous TID WC Hessie Knows, MD   0 Units at 11/11/15 1200  . [START ON 11/12/2015] insulin aspart protamine- aspart (NOVOLOG MIX 70/30) injection 30 Units  30 Units Subcutaneous Q breakfast Lenis Noon, [facility]      . insulin aspart protamine- aspart (NOVOLOG MIX 70/30) injection 44 Units  44 Units Subcutaneous Q supper Lenis Noon, [facility]      . isosorbide mononitrate (IMDUR) 24 hr tablet 120 mg  120 mg Oral Daily Hessie Knows, MD   120 mg at 11/11/15 1216  . linagliptin (TRADJENTA) tablet 5 mg  5 mg Oral Daily Hessie Knows, MD   5 mg at 11/11/15 1216  . menthol-cetylpyridinium (CEPACOL) lozenge 3 mg  1 lozenge Oral PRN Hessie Knows, MD       Or  . phenol [facility]) mouth spray 1 spray  1 spray  Mouth/Throat PRN Hessie Knows, MD      . metFORMIN (GLUCOPHAGE) tablet 1,000 mg  1,000 mg Oral BID Hessie Knows, MD   1,000 mg at 11/11/15 1217  . metoCLOPramide (REGLAN) tablet 5-10 mg  5-10 mg Oral Q8H PRN Hessie Knows, MD       Or  . metoCLOPramide (REGLAN) injection 5-10 mg  5-10 mg Intravenous Q8H PRN Hessie Knows, MD      .  morphine 2 MG/ML injection 2 mg  2 mg Intravenous Q1H PRN Hessie Knows, MD   2 mg at 11/11/15 1412  . multivitamin with minerals tablet 1 tablet  1 tablet Oral Daily Hessie Knows, MD   1 tablet at 11/11/15 1217  . omega-3 acid ethyl esters (LOVAZA) capsule 2 g  2 g Oral Daily Hessie Knows, MD   2 g at 11/11/15 1218  . ondansetron (ZOFRAN) tablet 4 mg  4 mg Oral Q6H PRN Hessie Knows, MD       Or  . ondansetron [facility]) injection 4 mg  4 mg Intravenous Q6H PRN Hessie Knows, MD      . oxyCODONE (Oxy IR/ROXICODONE) immediate release tablet 5-10 mg  5-10 mg Oral Q3H PRN Hessie Knows, MD   5 mg at 11/11/15 1252  . pantoprazole (PROTONIX) EC tablet 80 mg  80 mg Oral Daily Hessie Knows, MD   80 mg at 11/11/15 1334  . potassium chloride (K-DUR,KLOR-CON) CR tablet 10 mEq  10 mEq Oral BID Hessie Knows, MD   10 mEq at 11/11/15 1334  . pravastatin (PRAVACHOL) tablet 40 mg  40 mg Oral Q24H Lenis Noon, [facility]      . pregabalin (LYRICA) capsule 75 mg  75 mg Oral BID Hessie Knows, MD   75 mg at 11/11/15 1339  . sodium chloride flush 0.9 % injection           . tamsulosin (FLOMAX) capsule 0.4 mg  0.4 mg Oral Daily Hessie Knows, MD   0.4 mg at 11/11/15 1220  . vitamin C (ASCORBIC ACID) tablet 1,000 mg  1,000 mg Oral Daily Hessie Knows, MD   1,000 mg at 11/11/15 1221  . vitamin E capsule 400 Units  400 Units Oral BID Hessie Knows, MD   400 Units at 11/11/15 1222  . warfarin (COUMADIN) tablet 4 mg  4 mg Oral ONCE-1800 Lenis Noon, [facility]         Discharge Medications: Please see discharge summary for a list of discharge medications.  Relevant Imaging Results:  Relevant Lab  Results:   Additional Information  (SSN: [id number])  Loralyn Freshwater, LCSW

## 2015-11-12 ENCOUNTER — Encounter: Payer: Self-pay | Admitting: Orthopedic Surgery

## 2015-11-12 LAB — GLUCOSE, CAPILLARY
GLUCOSE-CAPILLARY: 259 mg/dL — AB (ref 65–99)
GLUCOSE-CAPILLARY: 178 mg/dL — AB (ref 65–99)
Glucose-Capillary: 203 mg/dL — ABNORMAL HIGH (ref 65–99)
Glucose-Capillary: 134 mg/dL — ABNORMAL HIGH (ref 65–99)

## 2015-11-12 LAB — CBC
HEMATOCRIT: 32.1 % — AB (ref 40.0–52.0)
HEMOGLOBIN: 11.2 g/dL — AB (ref 13.0–18.0)
MCH: 32.1 pg (ref 26.0–34.0)
MCHC: 35 g/dL (ref 32.0–36.0)
MCV: 91.8 fL (ref 80.0–100.0)
Platelets: 159 10*3/uL (ref 150–440)
RBC: 3.49 MIL/uL — AB (ref 4.40–5.90)
RDW: 15.9 % — ABNORMAL HIGH (ref 11.5–14.5)
WBC: 9.9 10*3/uL (ref 3.8–10.6)

## 2015-11-12 LAB — PROTIME-INR
INR: 1.14
Prothrombin Time: 14.8 seconds (ref 11.4–15.0)

## 2015-11-12 MED ORDER — WARFARIN - PHARMACIST DOSING INPATIENT: Freq: Every day | Status: DC

## 2015-11-12 MED ORDER — WARFARIN SODIUM 2 MG PO TABS
4.0000 mg | ORAL_TABLET | Freq: Once | ORAL | Status: AC
  Administered 2015-11-12: 4 mg via ORAL
  Filled 2015-11-12: qty 2

## 2015-11-12 NOTE — Care Management Note (Signed)
Case Management Note  Patient Details  Name: Chase Cabrera MRN: [id number] Date of Birth: 04/30/1946  Subjective/Objective:                  Met with patient and his wife to discuss discharge planning. His wife would not allow patient to speak so I had to encourage him to answer assessment questions. He wants to go to [facility] for SNF like before. He claims his "wife can not take care of him". He has his rolling walker her at the hospital. He has used [facility] in the past. He uses [facility] in [place] for Rx.   Action/Plan: List of home health providers left with patient. Referral will be made to [facility] if patient progresses to HHPT. RNCM will continue to follow.   Expected Discharge Date:  11/13/15               Expected Discharge Plan:     In-House Referral:     Discharge planning Services  CM Consult  Post Acute Care Choice:  Home Health Choice offered to:  Patient  DME Arranged:    DME Agency:     HH Arranged:    HH Agency:  [facility]  Status of Service:  In process, will continue to follow  Medicare Important Message Given:    Date Medicare IM Given:    Medicare IM give by:    Date Additional Medicare IM Given:    Additional Medicare Important Message give by:     If discussed at Long Length of Stay Meetings, dates discussed:    Additional Comments:   , RN 11/12/2015, 8:41 AM  

## 2015-11-12 NOTE — Discharge Instructions (Addendum)
Information on my medicine - Coumadin   (Warfarin)  Why was Coumadin prescribed for you? Coumadin was prescribed for you because you have a blood clot or a medical condition that can cause an increased risk of forming blood clots. Blood clots can cause serious health problems by blocking the flow of blood to the heart, lung, or brain. Coumadin can prevent harmful blood clots from forming. As a reminder your indication for Coumadin is:   Stroke Prevention Because Of Atrial Fibrillation  What test will check on my response to Coumadin? While on Coumadin (warfarin) you will need to have an INR test regularly to ensure that your dose is keeping you in the desired range. The INR (international normalized ratio) number is calculated from the result of the laboratory test called prothrombin time (PT).  If an INR APPOINTMENT HAS NOT ALREADY BEEN MADE FOR YOU please schedule an appointment to have this lab work done by your health care provider within 7 days. Your INR goal is usually a number between:  2 to 3 or your provider may give you a more narrow range like 2-2.5.  Ask your health care provider during an office visit what your goal INR is.  What  do you need to  know  About  COUMADIN? Take Coumadin (warfarin) exactly as prescribed by your healthcare provider about the same time each day.  DO NOT stop taking without talking to the doctor who prescribed the medication.  Stopping without other blood clot prevention medication to take the place of Coumadin may increase your risk of developing a new clot or stroke.  Get refills before you run out.  What do you do if you miss a dose? If you miss a dose, take it as soon as you remember on the same day then continue your regularly scheduled regimen the next day.  Do not take two doses of Coumadin at the same time.  Important Safety Information A possible side effect of Coumadin (Warfarin) is an increased risk of bleeding. You should call your healthcare  provider right away if you experience any of the following: ? Bleeding from an injury or your nose that does not stop. ? Unusual colored urine (red or dark brown) or unusual colored stools (red or black). ? Unusual bruising for unknown reasons. ? A serious fall or if you hit your head (even if there is no bleeding).  Some foods or medicines interact with Coumadin (warfarin) and might alter your response to warfarin. To help avoid this: ? Eat a balanced diet, maintaining a consistent amount of Vitamin K. ? Notify your provider about major diet changes you plan to make. ? Avoid alcohol or limit your intake to 1 drink for women and 2 drinks for men per day. (1 drink is 5 oz. wine, 12 oz. beer, or 1.5 oz. liquor.)  Make sure that ANY health care provider who prescribes medication for you knows that you are taking Coumadin (warfarin).  Also make sure the healthcare provider who is monitoring your Coumadin knows when you have started a new medication including herbals and non-prescription products.  Coumadin (Warfarin)  Major Drug Interactions  Increased Warfarin Effect Decreased Warfarin Effect  Alcohol (large quantities) Antibiotics (esp. Septra/Bactrim, Flagyl, Cipro) Amiodarone (Cordarone) Aspirin (ASA) Cimetidine (Tagamet) Megestrol (Megace) NSAIDs (ibuprofen, naproxen, etc.) Piroxicam (Feldene) Propafenone (Rythmol SR) Propranolol (Inderal) Isoniazid (INH) Posaconazole (Noxafil) Barbiturates (Phenobarbital) Carbamazepine (Tegretol) Chlordiazepoxide (Librium) Cholestyramine (Questran) Griseofulvin Oral Contraceptives Rifampin Sucralfate (Carafate) Vitamin K   Coumadin (Warfarin) Major Herbal  Interactions  Increased Warfarin Effect Decreased Warfarin Effect  Garlic Ginseng Ginkgo biloba Coenzyme Q10 Green tea St. Johns wort    Coumadin (Warfarin) FOOD Interactions  Eat a consistent number of servings per week of foods HIGH in Vitamin K (1 serving =  cup)  Collards  (cooked, or boiled & drained) Kale (cooked, or boiled & drained) Mustard greens (cooked, or boiled & drained) Parsley *serving size only =  cup Spinach (cooked, or boiled & drained) Swiss chard (cooked, or boiled & drained) Turnip greens (cooked, or boiled & drained)  Eat a consistent number of servings per week of foods MEDIUM-HIGH in Vitamin K (1 serving = 1 cup)  Asparagus (cooked, or boiled & drained) Broccoli (cooked, boiled & drained, or raw & chopped) Brussel sprouts (cooked, or boiled & drained) *serving size only =  cup Lettuce, raw (green leaf, endive, romaine) Spinach, raw Turnip greens, raw & chopped   These websites have more information on Coumadin (warfarin):  FailFactory.se; VeganReport.com.au;                 ANTERIOR APPROACH TOTAL HIP REPLACEMENT POSTOPERATIVE DIRECTIONS   Hip Rehabilitation, Guidelines Following Surgery  The results of a hip operation are greatly improved after range of motion and muscle strengthening exercises. Follow all safety measures which are given to protect your hip. If any of these exercises cause increased pain or swelling in your joint, decrease the amount until you are comfortable again. Then slowly increase the exercises. Call your caregiver if you have problems or questions.   HOME CARE INSTRUCTIONS  Remove items at home which could result in a fall. This includes throw rugs or furniture in walking pathways.   ICE to the affected hip every three hours for 30 minutes at a time and then as needed for pain and swelling.  Continue to use ice on the hip for pain and swelling from surgery. You may notice swelling that will progress down to the foot and ankle.  This is normal after surgery.  Elevate the leg when you are not up walking on it.    Continue to use the breathing machine which will help keep your temperature down.  It is common for your temperature to cycle up and down following surgery, especially  at night when you are not up moving around and exerting yourself.  The breathing machine keeps your lungs expanded and your temperature down.  Do not place pillow under knee, focus on keeping the knee straight while resting Patient will need daily INR checks. Adjust Coumadin as needed.  DIET You may resume your previous home diet once your are discharged from the hospital.  DRESSING / WOUND CARE / SHOWERING Keep dressing clean and dry. Change dressing only as needed. You may shower after your first follow up appointment with [facility].  ACTIVITY Walk with your walker as instructed. Use walker as long as suggested by your caregivers. Avoid periods of inactivity such as sitting longer than an hour when not asleep. This helps prevent blood clots.  You may resume a sexual relationship in one month or when given the OK by your doctor.  You may return to work once you are cleared by your doctor.  Do not drive a car for 6 weeks or until released by you surgeon.  Do not drive while taking narcotics.  WEIGHT BEARING Weight bearing as tolerated. Use walker/cane as needed for at least 4 weeks post op.  POSTOPERATIVE CONSTIPATION PROTOCOL Constipation - defined medically as  fewer than three stools per week and severe constipation as less than one stool per week.  One of the most common issues patients have following surgery is constipation.  Even if you have a regular bowel pattern at home, your normal regimen is likely to be disrupted due to multiple reasons following surgery.  Combination of anesthesia, postoperative narcotics, change in appetite and fluid intake all can affect your bowels.  In order to avoid complications following surgery, here are some recommendations in order to help you during your recovery period.  Colace (docusate) - Pick up an over-the-counter form of Colace or another stool softener and take twice a day as long as you are requiring postoperative pain  medications.  Take with a full glass of water daily.  If you experience loose stools or diarrhea, hold the colace until you stool forms back up.  If your symptoms do not get better within 1 week or if they get worse, check with your doctor.  Dulcolax (bisacodyl) - Pick up over-the-counter and take as directed by the product packaging as needed to assist with the movement of your bowels.  Take with a full glass of water.  Use this product as needed if not relieved by Colace only.   MiraLax (polyethylene glycol) - Pick up over-the-counter to have on hand.  MiraLax is a solution that will increase the amount of water in your bowels to assist with bowel movements.  Take as directed and can mix with a glass of water, juice, soda, coffee, or tea.  Take if you go more than two days without a movement. Do not use MiraLax more than once per day. Call your doctor if you are still constipated or irregular after using this medication for 7 days in a row.  If you continue to have problems with postoperative constipation, please contact the office for further assistance and recommendations.  If you experience "the worst abdominal pain ever" or develop nausea or vomiting, please contact the office immediatly for further recommendations for treatment.  ITCHING  If you experience itching with your medications, try taking only a single pain pill, or even half a pain pill at a time.  You can also use Benadryl over the counter for itching or also to help with sleep.   TED HOSE STOCKINGS Wear the elastic stockings on both legs for six weeks following surgery during the day but you may remove then at night for sleeping.  MEDICATIONS See your medication summary on the After Visit Summary that the nursing staff will review with you prior to discharge.  You may have some home medications which will be placed on hold until you complete the course of blood thinner medication.  It is important for you to complete the blood  thinner medication as prescribed by your surgeon.  Continue your approved medications as instructed at time of discharge.  PRECAUTIONS If you experience chest pain or shortness of breath - call 911 immediately for transfer to the hospital emergency department.  If you develop a fever greater that 101 F, purulent drainage from wound, increased redness or drainage from wound, foul odor from the wound/dressing, or calf pain - CONTACT YOUR SURGEON.                                                   FOLLOW-UP APPOINTMENTS Make sure  you keep all of your appointments after your operation with your surgeon and caregivers. You should call the office at the above phone number and make an appointment for approximately two weeks after the date of your surgery or on the date instructed by your surgeon outlined in the "After Visit Summary".  RANGE OF MOTION AND STRENGTHENING EXERCISES  These exercises are designed to help you keep full movement of your hip joint. Follow your caregiver's or physical therapist's instructions. Perform all exercises about fifteen times, three times per day or as directed. Exercise both hips, even if you have had only one joint replacement. These exercises can be done on a training (exercise) mat, on the floor, on a table or on a bed. Use whatever works the best and is most comfortable for you. Use music or television while you are exercising so that the exercises are a pleasant break in your day. This will make your life better with the exercises acting as a break in routine you can look forward to.  Lying on your back, slowly slide your foot toward your buttocks, raising your knee up off the floor. Then slowly slide your foot back down until your leg is straight again.  Lying on your back spread your legs as far apart as you can without causing discomfort.  Lying on your side, raise your upper leg and foot straight up from the floor as far as is comfortable. Slowly lower the leg and  repeat.  Lying on your back, tighten up the muscle in the front of your thigh (quadriceps muscles). You can do this by keeping your leg straight and trying to raise your heel off the floor. This helps strengthen the largest muscle supporting your knee.  Lying on your back, tighten up the muscles of your buttocks both with the legs straight and with the knee bent at a comfortable angle while keeping your heel on the floor.   IF YOU ARE TRANSFERRED TO A SKILLED REHAB FACILITY If the patient is transferred to a skilled rehab facility following release from the hospital, a list of the current medications will be sent to the facility for the patient to continue.  When discharged from the skilled rehab facility, please have the facility set up the patient's New Home prior to being released. Also, the skilled facility will be responsible for providing the patient with their medications at time of release from the facility to include their pain medication, the muscle relaxants, and their blood thinner medication. If the patient is still at the rehab facility at time of the two week follow up appointment, the skilled rehab facility will also need to assist the patient in arranging follow up appointment in our office and any transportation needs.  MAKE SURE YOU:  Understand these instructions.  Get help right away if you are not doing well or get worse.  Patient will need daily INR checks. Adjust Coumadin as needed.   Pick up stool softner and laxative for home use following surgery while on pain medications. Continue to use ice for pain and swelling after surgery. Do not use any lotions or creams on the incision until instructed by your surgeon.

## 2015-11-12 NOTE — Progress Notes (Signed)
   Subjective: 1 Day Post-Op Procedure(s) (LRB): TOTAL HIP ARTHROPLASTY ANTERIOR APPROACH (Left) Patient reports pain as mild.   Patient is well, and has had no acute complaints or problems Denies any CP, SOB, ABD pain. We will continue therapy today.  Plan is to go Rehab after hospital stay.  Objective: Vital signs in last 24 hours: Temp:  [97 F (36.1 C)-99.7 F (37.6 C)] 99.5 F (37.5 C) (03/01 0317) Pulse Rate:  [54-67] 67 (03/01 0317) Resp:  [15-20] 20 (03/01 0317) BP: (98-167)/(50-63) 143/52 mmHg (03/01 0317) SpO2:  [90 %-100 %] 90 % (03/01 0317) Weight:  [92.08 kg (203 lb)] 92.08 kg (203 lb) (02/28 1200)  Intake/Output from previous day: 02/28 0701 - 03/01 0700 In: 2875 [P.O.:660; I.V.:2115; IV Piggyback:100] Out: 2875 [Urine:2375; Blood:500] Intake/Output this shift:     Recent Labs  11/11/15 1252 11/12/15 0448  HGB 11.7* 11.2*    Recent Labs  11/11/15 1252 11/12/15 0448  WBC 10.8* 9.9  RBC 3.69* 3.49*  HCT 34.3* 32.1*  PLT 169 159   No results for input(s): NA, K, CL, CO2, BUN, CREATININE, GLUCOSE, CALCIUM in the last 72 hours.  Recent Labs  11/11/15 0629 11/12/15 0448  INR 1.00 1.14    EXAM General - Patient is Alert, Appropriate and Oriented Extremity - Neurovascular intact Sensation intact distally Intact pulses distally Dorsiflexion/Plantar flexion intact Dressing - dressing C/D/I, scant drainage and hemovac intact Motor Function - intact, moving foot and toes well on exam.   Past Medical History  Diagnosis Date  . Myocardial infarction ([place])   . Arthritis   . Diabetes mellitus without complication ([place])   . Low blood potassium   . Hypertension   . CHF (congestive heart failure) ([place])   . Cancer ([place])     skin face  . Hyperlipemia   . Atrial fibrillation ([place])   . Spinal stenosis   . Renal artery stenosis ([place])   . Corneal dystrophy     bilateral  . Hyponatremia   . Urinary retention   . Gout   . Cough, persistent      Assessment/Plan:   1 Day Post-Op Procedure(s) (LRB): TOTAL HIP ARTHROPLASTY ANTERIOR APPROACH (Left) Active Problems:   Primary osteoarthritis of left hip  Estimated body mass index is 32.78 kg/(m^2) as calculated from the following:   Height as of this encounter: 5' 6"  (1.676 m).   Weight as of this encounter: 92.08 kg (203 lb). Advance diet Up with therapy  Needs BM Pharmacy assisting with managing Coumadin, recheck INR in the am Recheck labs in the am  DVT Prophylaxis - Aspirin and Plavix, coumadin Weight-Bearing as tolerated to left leg D/C O2 and Pulse OX and try on Room Air  T. Rachelle Hora, PA-C [place] 11/12/2015, 8:05 AM

## 2015-11-12 NOTE — Progress Notes (Addendum)
ANTICOAGULATION CONSULT NOTE - Initial Consult  Pharmacy Consult for warfarin Indication: VTE prophylaxis  Allergies  Allergen Reactions  . Allopurinol Diarrhea, Other (See Comments) and Nausea And Vomiting    Other Reaction: GI Upset  . Atenolol Other (See Comments)    Other Reaction: bradycardia  . Atorvastatin Other (See Comments)    Other Reaction: muscle aches  . Ramipril Rash  . Simvastatin Other (See Comments)    Other Reaction: MUSCLE ACHES (Zocor)  . Valsartan Other (See Comments)    Other Reaction: facial swelling  . Rosuvastatin Other (See Comments)    Muscle aches    Patient Measurements: Height: 5' 6"  (167.6 cm) Weight: 203 lb (92.08 kg) IBW/kg (Calculated) : 63.8  Vital Signs: Temp: 99.5 F (37.5 C) (03/01 0317) Temp Source: Oral (03/01 0317) BP: 143/52 mmHg (03/01 0317) Pulse Rate: 67 (03/01 0317)  Labs:  Recent Labs  11/11/15 0629 11/11/15 1252 11/12/15 0448  HGB  --  11.7* 11.2*  HCT  --  34.3* 32.1*  PLT  --  169 159  LABPROT 13.4  --  14.8  INR 1.00  --  1.14    Estimated Creatinine Clearance: 83.7 mL/min (by C-G formula based on Cr of 0.86).  Assessment: Pharmacy consulted to dose warfarin in this 70 year old male who is post-op from total hip arthroplasty today. Patient was taking warfarin prior to admission for atrial fibrillation with a home dose of 4 mg daily. Patient held warfarin since 2/22. Does not appear as though Vit K was given.   INR post surgery is 1 3/1: INR has increased slightly to 1.14 after 1 dose  Goal of Therapy:  INR 2-3 Monitor platelets by anticoagulation protocol: Yes   Plan:  Will continue patient home dose of 4 mg tonight INR ordered with AM labs tomorrow Per Dr. Rudene Christians- pt is on ASA, plavix- had increased bleeding during surgery. Does not want to bridge with lovenox as he is concerned about bleeding complications.  Ramond Dial, PharmD Clinical Pharmacist 11/12/2015,7:29 AM

## 2015-11-12 NOTE — Progress Notes (Signed)
Physical Therapy Treatment Patient Details Name: Chase Cabrera MRN: [id number] DOB: 03/03/1946 Today's Date: 11/12/2015    History of Present Illness This patient is a 70 year old male who came to [facility] for a L THR (anterior aproach).    PT Comments    Pt with decreased progress this date and is impulsive with all mobility. Pt tries multiple times for sit<>Stand from [facility] with 1 attempt with uncontrolled descent back to [facility]. Pt assisted in ambulation back to bed. Pt is not safe for continued ambulation at this time. Good endurance with there-ex this date.  Follow Up Recommendations  SNF     Equipment Recommendations       Recommendations for Other Services       Precautions / Restrictions Precautions Precautions: Fall;Anterior Hip Precaution Booklet Issued: Yes (comment) Restrictions Weight Bearing Restrictions: Yes LLE Weight Bearing: Weight bearing as tolerated    Mobility  Bed Mobility Overal bed mobility: Needs Assistance Bed Mobility: Sit to Supine     Supine to sit: Mod assist Sit to supine: Mod assist   General bed mobility comments: assist for moving B LE onto bed. Pt able to follow commands for sequencing.  Transfers Overall transfer level: Needs assistance Equipment used: Rolling walker (2 wheeled) Transfers: Sit to/from Stand Sit to Stand: Mod assist         General transfer comment: assist for correct technique. Several attempts at standing performed, one with abrupt uncontrolled descent back to [facility] as B LE gave out. Further attempts performed with mod assist with cues for upright posture.  Ambulation/Gait Ambulation/Gait assistance: Min assist Ambulation Distance (Feet): 5 Feet Assistive device: Rolling walker (2 wheeled) Gait Pattern/deviations: Step-to pattern     General Gait Details: ambulated with step to gait pattern including cues for keeping rw close to body. Pt with forward flexed posture, however able to stand upright when cued. Pt  very impulsive this date   Stairs            Wheelchair Mobility    Modified Rankin (Stroke Patients Only)       Balance                                    Cognition Arousal/Alertness: Awake/alert Behavior During Therapy: WFL for tasks assessed/performed Overall Cognitive Status: Within Functional Limits for tasks assessed                      Exercises Other Exercises Other Exercises: supine ther-ex performed including L LE ankle pumps, quad sets, hip abd/add, glut squeezes, SAQ, and SLRs. All ther-ex performed x 12 reps with min assist for correct technique. Other Exercises: Pt required assist for toileting including min assist for hygiene. Pt impulsive and tries to stand without cues from therapist.    General Comments        Pertinent Vitals/Pain Pain Assessment: 0-10 Pain Score: 6  Pain Location: L hip Pain Descriptors / Indicators: Operative site guarding Pain Intervention(s): Limited activity within patient's tolerance;Premedicated before session    Home Living                      Prior Function            PT Goals (current goals can now be found in the care plan section) Acute Rehab PT Goals Patient Stated Goal: to get stronger PT Goal Formulation: With patient  Time For Goal Achievement: 11/25/15 Potential to Achieve Goals: Good Progress towards PT goals: Progressing toward goals    Frequency  BID    PT Plan Current plan remains appropriate    Co-evaluation             End of Session Equipment Utilized During Treatment: Gait belt Activity Tolerance: Patient tolerated treatment well Patient left: in bed;with call bell/phone within reach;with bed alarm set;with family/visitor present;with SCD's reapplied     Time: 5001-6429 PT Time Calculation (min) (ACUTE ONLY): 25 min  Charges:  $Gait Training: 8-22 mins $Therapeutic Exercise: 8-22 mins $Therapeutic Activity: 8-22 mins                    G  Codes:      Kieu Quiggle 18-Nov-2015, 3:48 PM  Greggory Stallion, PT, DPT [phone]

## 2015-11-12 NOTE — Evaluation (Signed)
Occupational Therapy Evaluation Patient Details Name: Chase Cabrera MRN: [id number] DOB: 1946-02-12 Today's Date: 11/12/2015    History of Present Illness This patient is a 70 year old male who came to [facility] for a L THR (anterior approach).   Clinical Impression   This patient is a 70 year old male who came to [facility] for a left total hip replacement (anterior approach).  Patient lives with his wife in a one story home with a ramp to enter.  He had been modified independent with ADL and functional mobility using a scooter at times. He now has deficits with pain, mobility and activities of daily living and would benefit from Occupational Therapy for ADL/functional mobility training.      Follow Up Recommendations  SNF    Equipment Recommendations       Recommendations for Other Services       Precautions / Restrictions Precautions Precautions: Fall Restrictions LLE Weight Bearing: Weight bearing as tolerated      Mobility Bed Mobility                  Transfers                      Balance                                            ADL                                         General ADL Comments: Had been modified independent. Today, practiced techniques for lower body dressing. Patient unable to reach feet so practiced Donned/doffed socks and pants to knees (drain still in place). Needed set up and minimal assist with verbal cues for technique and safety.      Vision     Perception     Praxis      Pertinent Vitals/Pain Pain Score: 2  Pain Location: left hip Pain Descriptors / Indicators: Operative site guarding     Hand Dominance     Extremity/Trunk Assessment Upper Extremity Assessment Upper Extremity Assessment: Overall WFL for tasks assessed   Lower Extremity Assessment Lower Extremity Assessment: Defer to PT evaluation       Communication  Communication Communication: No difficulties   Cognition Arousal/Alertness: Awake/alert Behavior During Therapy: WFL for tasks assessed/performed Overall Cognitive Status: Within Functional Limits for tasks assessed                     General Comments       Exercises       Shoulder Instructions      Home Living Family/patient expects to be discharged to:: Skilled nursing facility Living Arrangements: Spouse/significant other Available Help at Discharge: Family;Available 24 hours/day Type of Home: House Home Access: Ramped entrance     Home Layout: One level               Home Equipment: Walker - 2 wheels;Electric scooter          Prior Functioning/Environment Level of Independence: Independent with assistive device(s)             OT Diagnosis: Acute pain   OT Problem List:     OT Treatment/Interventions: Self-care/ADL training  OT Goals(Current goals can be found in the care plan section) Acute Rehab OT Goals Patient Stated Goal: to get stronger OT Goal Formulation: With patient/family Time For Goal Achievement: 11/26/15 Potential to Achieve Goals: Good  OT Frequency: Min 1X/week   Barriers to D/C:            Co-evaluation              End of Session Equipment Utilized During Treatment:  (hip kit)  Activity Tolerance:   Patient left: in chair;with call bell/phone within reach;with chair alarm set;with family/visitor present   Time: 1046-1106 OT Time Calculation (min): 20 min Charges:  OT General Charges $OT Visit: 1 Procedure OT Evaluation $OT Eval Low Complexity: 1 Procedure OT Treatments $Self Care/Home Management : 8-22 mins G-Codes:    Myrene Galas, MS/OTR/L  11/12/2015, 11:17 AM

## 2015-11-12 NOTE — Clinical Social Work Note (Signed)
Clinical Social Work Assessment  Patient Details  Name: Chase Cabrera MRN: [id number] Date of Birth: 07/19/1946  Date of referral:  11/12/15               Reason for consult:  Facility Placement                Permission sought to share information with:  [name] granted to share information::  Yes, Verbal Permission Granted  Name::      [place]::   [place]   Relationship::     Contact Information:     Housing/Transportation Living arrangements for the past 2 months:  [place] of Information:  Patient, Spouse Patient Interpreter Needed:  None Criminal Activity/Legal Involvement Pertinent to Current Situation/Hospitalization:  No - Comment as needed Significant Relationships:  Adult Children, Spouse Lives with:  Spouse Do you feel safe going back to the place where you live?  Yes Need for family participation in patient care:  Yes (Comment)  Care giving concerns:  Patient lives in [place] with his wife Chase Cabrera [phone].    Social Worker assessment / plan:  Holiday representative (CSW) received SNF consult. PT is recommending SNF. CSW met with patient and his wife Chase Cabrera was at bedside. Patient's pastor Chase Cabrera was also at bedside. CSW introduced self and explained role of CSW department. Patient was alert and oriented and sitting up in the chair. Patient reported that he lives in [place] with his wife. Wife reported that their 45 y.o grandson Chase Cabrera also lives in the home. CSW explained that PT is recommending SNF. Patient and wife are agreeable to SNF search and prefer [facility]. CSW explained that patient will need a 3 night inpatient qualifying stay in order for Medicare to pay for rehab. Wife verbalized her understanding.   CSW presented bed offers to patient and his wife. They chose [facility]. Doug admissions coordinator at [facility] is aware of accepted bed offer. CSW will  continue to follow and assist as needed.   Employment status:  Retired Forensic scientist:  Medicare PT Recommendations:  [place] / Referral to community resources:  [place]  Patient/Family's Response to care:  Patient and wife are agreeable for patient to go to [facility] for rehab.   Patient/Family's Understanding of and Emotional Response to Diagnosis, Current Treatment, and Prognosis:  Patient and wife were pleasant throughout assessment and thanked CSW for visit.   Emotional Assessment Appearance:  Appears stated age Attitude/Demeanor/Rapport:    Affect (typically observed):  Accepting, Adaptable, Pleasant Orientation:  Oriented to Self, Oriented to Place, Oriented to  Time, Oriented to Situation Alcohol / Substance use:  Not Applicable Psych involvement (Current and /or in the community):  No (Comment)  Discharge Needs  Concerns to be addressed:  Discharge Planning Concerns Readmission within the last 30 days:  No Current discharge risk:  Dependent with Mobility Barriers to Discharge:  Continued Medical Work up   Loralyn Freshwater, LCSW 11/12/2015, 1:48 PM

## 2015-11-12 NOTE — Anesthesia Postprocedure Evaluation (Signed)
Anesthesia Post Note  Patient: Chase Cabrera  Procedure(s) Performed: Procedure(s) (LRB): TOTAL HIP ARTHROPLASTY ANTERIOR APPROACH (Left)  Patient location during evaluation: Other Anesthesia Type: Spinal Level of consciousness: oriented and awake and alert Pain management: pain level controlled Vital Signs Assessment: post-procedure vital signs reviewed and stable Respiratory status: spontaneous breathing, respiratory function stable and patient connected to nasal cannula oxygen Cardiovascular status: blood pressure returned to baseline and stable Postop Assessment: no headache and no backache Anesthetic complications: no    Last Vitals:  Filed Vitals:   11/12/15 0317 11/12/15 0847  BP: 143/52 151/57  Pulse: 67 66  Temp: 37.5 C 36.9 C  Resp: 20 18    Last Pain:  Filed Vitals:   11/12/15 1234  PainSc: 5                  Doreen Salvage A

## 2015-11-12 NOTE — Progress Notes (Signed)
Physical Therapy Treatment Patient Details Name: Chase Cabrera MRN: [id number] DOB: Nov 12, 1945 Today's Date: 11/12/2015    History of Present Illness This patient is a 70 year old male who came to [facility] for a L THR (anterior aproach).    PT Comments    Pt is making good progress towards goals with increased distance noted this session. Pt with unsafe technique and requires min assist for correct use of rw. Pt able to increase reps on there-ex this date. Pt is limited by pain this date, however continues to be motivated to perform therapy.  Follow Up Recommendations  SNF     Equipment Recommendations       Recommendations for Other Services       Precautions / Restrictions Precautions Precautions: Fall;Anterior Hip Precaution Booklet Issued: No Restrictions Weight Bearing Restrictions: Yes LLE Weight Bearing: Weight bearing as tolerated    Mobility  Bed Mobility Overal bed mobility: Needs Assistance Bed Mobility: Supine to Sit     Supine to sit: Mod assist     General bed mobility comments: assist for sliding B LE off bed and heavy cues for sequencing using B UEs. Once seated at EOB, assist required for propping out L LE for comfort  Transfers Overall transfer level: Needs assistance Equipment used: Rolling walker (2 wheeled) Transfers: Sit to/from Stand Sit to Stand: Min assist         General transfer comment: assist for correct technique and once standing, pt cued for upright posture.   Ambulation/Gait Ambulation/Gait assistance: Min assist Ambulation Distance (Feet): 40 Feet Assistive device: Rolling walker (2 wheeled) Gait Pattern/deviations: Step-to pattern     General Gait Details: ambulated with step to gait pattern. Pt used own rw for assistance and therapist adjusted to correct height. Pt fatigues with increased distance. Pt keeps rw too far away from body with min assist for assistance. Pt also with very slow gait pattern   Stairs             Wheelchair Mobility    Modified Rankin (Stroke Patients Only)       Balance                                    Cognition Arousal/Alertness: Awake/alert Behavior During Therapy: WFL for tasks assessed/performed Overall Cognitive Status: Within Functional Limits for tasks assessed                      Exercises Other Exercises Other Exercises: supine ther-ex performed on L LE including 12 reps of ankle pumps, quad sets, glut sets, SLRs, SAQ, and hip abd/add. All ther-ex performed with min assist. Other Exercises: Pt ambulated to bathroom with min assist for safety as pt tries to sit prior to therapist instruction. Pt also tries to stand prior to therapist in bathroom. Min assist required for hygiene    General Comments        Pertinent Vitals/Pain Pain Assessment: 0-10 Pain Score: 10-Worst pain ever Pain Location: L hip Pain Descriptors / Indicators: Operative site guarding Pain Intervention(s): Limited activity within patient's tolerance;RN gave pain meds during session    [place] expects to be discharged to:: Skilled nursing facility Living Arrangements: Spouse/significant other Available Help at Discharge: Family;Available 24 hours/day Type of Home: House Home Access: Ramped entrance   Home Layout: One level Home Equipment: Walker - 2 wheels;Electric scooter      Prior  Function Level of Independence: Independent with assistive device(s)          PT Goals (current goals can now be found in the care plan section) Acute Rehab PT Goals Patient Stated Goal: to get stronger PT Goal Formulation: With patient Time For Goal Achievement: 11/25/15 Potential to Achieve Goals: Good Progress towards PT goals: Progressing toward goals    Frequency  BID    PT Plan Current plan remains appropriate    Co-evaluation             End of Session Equipment Utilized During Treatment: Gait belt Activity Tolerance: Patient  tolerated treatment well Patient left: in chair;with chair alarm set     Time: 0915-1000 PT Time Calculation (min) (ACUTE ONLY): 45 min  Charges:  $Gait Training: 8-22 mins $Therapeutic Exercise: 8-22 mins $Therapeutic Activity: 8-22 mins                    G Codes:      Abel Hageman 12/10/15, 12:02 PM  Greggory Stallion, PT, DPT [phone]

## 2015-11-12 NOTE — Clinical Social Work Placement (Signed)
   CLINICAL SOCIAL WORK PLACEMENT  NOTE  Date:  11/12/2015  Patient Details  Name: SKYLAN LARA MRN: [id number] Date of Birth: October 28, 1945  Clinical Social Work is seeking post-discharge placement for this patient at the [place] level of care (*CSW will initial, date and re-position this form in  chart as items are completed):  Yes   Patient/family provided with [place] Work Department's list of facilities offering this level of care within the geographic area requested by the patient (or if unable, by the patient's family).  Yes   Patient/family informed of their freedom to choose among providers that offer the needed level of care, that participate in Medicare, Medicaid or managed care program needed by the patient, have an available bed and are willing to accept the patient.  Yes   Patient/family informed of [facility]'s ownership interest in [facility] and [facility], as well as of the fact that they are under no obligation to receive care at these facilities.  PASRR submitted to EDS on 11/12/15     PASRR number received on 11/12/15     Existing PASRR number confirmed on       FL2 transmitted to all facilities in geographic area requested by pt/family on 11/12/15     FL2 transmitted to all facilities within larger geographic area on       Patient informed that his/her managed care company has contracts with or will negotiate with certain facilities, including the following:        Yes   Patient/family informed of bed offers received.  Patient chooses bed at  [facility] )     Physician recommends and patient chooses bed at      Patient to be transferred to   on  .  Patient to be transferred to facility by       Patient family notified on   of transfer.  Name of family member notified:        PHYSICIAN       Additional Comment:    _______________________________________________ Loralyn Freshwater,  LCSW 11/12/2015, 1:41 PM

## 2015-11-13 LAB — PROTIME-INR
INR: 1.2
PROTHROMBIN TIME: 15.4 s — AB (ref 11.4–15.0)

## 2015-11-13 LAB — GLUCOSE, CAPILLARY
GLUCOSE-CAPILLARY: 211 mg/dL — AB (ref 65–99)
GLUCOSE-CAPILLARY: 143 mg/dL — AB (ref 65–99)
Glucose-Capillary: 170 mg/dL — ABNORMAL HIGH (ref 65–99)
Glucose-Capillary: 191 mg/dL — ABNORMAL HIGH (ref 65–99)
Glucose-Capillary: 212 mg/dL — ABNORMAL HIGH (ref 65–99)

## 2015-11-13 LAB — BASIC METABOLIC PANEL
ANION GAP: 8 (ref 5–15)
BUN: 15 mg/dL (ref 6–20)
CALCIUM: 8.3 mg/dL — AB (ref 8.9–10.3)
CO2: 25 mmol/L (ref 22–32)
CREATININE: 0.98 mg/dL (ref 0.61–1.24)
Chloride: 101 mmol/L (ref 101–111)
GFR calc non Af Amer: >60 mL/min (ref >60)
Glucose, Bld: 185 mg/dL — ABNORMAL HIGH (ref 65–99)
Potassium: 3.9 mmol/L (ref 3.5–5.1)
SODIUM: 134 mmol/L — AB (ref 135–145)

## 2015-11-13 LAB — CBC
HCT: 30.1 % — ABNORMAL LOW (ref 40.0–52.0)
HEMOGLOBIN: 10.5 g/dL — AB (ref 13.0–18.0)
MCH: 31.9 pg (ref 26.0–34.0)
MCHC: 34.8 g/dL (ref 32.0–36.0)
MCV: 91.6 fL (ref 80.0–100.0)
Platelets: 147 10*3/uL — ABNORMAL LOW (ref 150–440)
RBC: 3.28 MIL/uL — ABNORMAL LOW (ref 4.40–5.90)
RDW: 15.7 % — AB (ref 11.5–14.5)
WBC: 9.1 10*3/uL (ref 3.8–10.6)

## 2015-11-13 LAB — SURGICAL PATHOLOGY

## 2015-11-13 MED ORDER — WARFARIN SODIUM 3 MG PO TABS
6.0000 mg | ORAL_TABLET | Freq: Once | ORAL | Status: AC
  Administered 2015-11-13: 6 mg via ORAL
  Filled 2015-11-13 (×2): qty 2

## 2015-11-13 MED ORDER — MAGNESIUM HYDROXIDE 400 MG/5ML PO SUSP
30.0000 mL | Freq: Every day | ORAL | Status: DC | PRN
  Administered 2015-11-13: 30 mL via ORAL
  Filled 2015-11-13: qty 30

## 2015-11-13 MED ORDER — PREGABALIN 75 MG PO CAPS: 75.0000 mg | ORAL_CAPSULE | Freq: Two times a day (BID) | ORAL | Status: DC

## 2015-11-13 MED ORDER — OXYCODONE HCL 5 MG PO TABS: 5.0000 mg | ORAL_TABLET | ORAL | Status: DC | PRN

## 2015-11-13 MED ORDER — BISACODYL 10 MG RE SUPP
10.0000 mg | Freq: Every day | RECTAL | Status: DC | PRN
  Administered 2015-11-13: 10 mg via RECTAL
  Filled 2015-11-13: qty 1

## 2015-11-13 NOTE — Progress Notes (Signed)
ANTICOAGULATION CONSULT NOTE - Initial Consult  Pharmacy Consult for warfarin Indication: VTE prophylaxis  Allergies  Allergen Reactions  . Allopurinol Diarrhea, Other (See Comments) and Nausea And Vomiting    Other Reaction: GI Upset  . Atenolol Other (See Comments)    Other Reaction: bradycardia  . Atorvastatin Other (See Comments)    Other Reaction: muscle aches  . Ramipril Rash  . Simvastatin Other (See Comments)    Other Reaction: MUSCLE ACHES (Zocor)  . Valsartan Other (See Comments)    Other Reaction: facial swelling  . Rosuvastatin Other (See Comments)    Muscle aches    Patient Measurements: Height: 5' 6"  (167.6 cm) Weight: 203 lb (92.08 kg) IBW/kg (Calculated) : 63.8  Vital Signs: Temp: 99.9 F (37.7 C) (03/02 0737) Temp Source: Oral (03/02 0737) BP: 152/52 mmHg (03/02 0737) Pulse Rate: 69 (03/02 0737)  Labs:  Recent Labs  11/11/15 0629  11/11/15 1252 11/12/15 0448 11/13/15 0513  HGB  --   < > 11.7* 11.2* 10.5*  HCT  --   --  34.3* 32.1* 30.1*  PLT  --   --  169 159 147*  LABPROT 13.4  --   --  14.8 15.4*  INR 1.00  --   --  1.14 1.20  CREATININE  --   --   --   --  0.98  < > = values in this interval not displayed.  Estimated Creatinine Clearance: 73.4 mL/min (by C-G formula based on Cr of 0.98).  Assessment: Pharmacy consulted to dose warfarin in this 70 year old male who is post-op from total hip arthroplasty today. Patient was taking warfarin prior to admission for atrial fibrillation with a home dose of 4 mg daily. Patient held warfarin since 2/22. Does not appear as though Vit K was given.   INR post surgery is 1 3/1: INR has increased slightly to 1.14 after 1 dose 3/2: INR 1.2  Goal of Therapy:  INR 2-3 Monitor platelets by anticoagulation protocol: Yes   Plan:  Will boost pt with 1.5 times home dose (68m) tonight then resume normal home dose. INR ordered with AM labs tomorrow Per Dr. MRudene Christians pt is on ASA, plavix- had increased bleeding  during surgery. Does not want to bridge with lovenox as he is concerned about bleeding complications.  MRamond Dial PharmD Clinical Pharmacist 11/13/2015,11:24 AM

## 2015-11-13 NOTE — Progress Notes (Signed)
Patient has no acute event overnight. He remained hemodynamically stable with VS WDL for patient. Patient c/o of pain on the left leg was relieved with PRN pain med.He rested for most of the night, and wife was at the bedside

## 2015-11-13 NOTE — Discharge Summary (Signed)
Physician Discharge Summary  Patient ID: Chase Cabrera MRN: [id number] DOB/AGE: 72-Jul-1947 72 y.o.  Admit date: 11/11/2015 Discharge date: 11/14/2015 Admission Diagnoses:  OSTEOARTHRITIS   Discharge Diagnoses: Patient Active Problem List   Diagnosis Date Noted  . Primary osteoarthritis of left hip 11/11/2015    Past Medical History  Diagnosis Date  . Myocardial infarction ([place])   . Arthritis   . Diabetes mellitus without complication ([place])   . Low blood potassium   . Hypertension   . CHF (congestive heart failure) ([place])   . Cancer ([place])     skin face  . Hyperlipemia   . Atrial fibrillation ([place])   . Spinal stenosis   . Renal artery stenosis ([place])   . Corneal dystrophy     bilateral  . Hyponatremia   . Urinary retention   . Gout   . Cough, persistent      Transfusion: none   Consultants (if any):    Discharged Condition: Improved  Hospital Course: Chase Cabrera is an 70 y.o. male who was admitted 11/11/2015 with a diagnosis of left total hip arthroplasty and went to the operating room on 11/11/2015 and underwent the above named procedures.    Surgeries: Procedure(s): TOTAL HIP ARTHROPLASTY ANTERIOR APPROACH on 11/11/2015 Patient tolerated the surgery well. Taken to PACU where she was stabilized and then transferred to the orthopedic floor.  Started on Coumadin, Plavix. Foot pumps applied bilaterally at 80 mm. Heels elevated on bed with rolled towels. No evidence of DVT. Negative Homan. Physical therapy started on day #1 for gait training and transfer. OT started day #1 for ADL and assisted devices.  Patient's foley was d/c on day #1. Patient's IV and hemovac was d/c on day #2.  On post op day #3 patient was stable and ready for discharge to SNF.  Implants: Medacta Amis 7 lateralized stem, 52 mm Mpact cup DM with liner and S 28 mm head  He was given perioperative antibiotics:  Anti-infectives    Start     Dose/Rate Route Frequency Ordered Stop   11/11/15  1400  ceFAZolin (ANCEF) IVPB 2 g/50 mL premix     2 g 100 mL/hr over 30 Minutes Intravenous Every 6 hours 11/11/15 1139 11/12/15 0123   11/11/15 0635  ceFAZolin (ANCEF) 2-3 GM-% IVPB SOLR    Comments:  Rexanne Mano: cabinet override      11/11/15 0635 11/11/15 1844   11/11/15 0245  ceFAZolin (ANCEF) IVPB 2 g/50 mL premix     2 g 100 mL/hr over 30 Minutes Intravenous  Once 11/11/15 0230 11/11/15 0800    .  He was given sequential compression devices, early ambulation, and coumadin for DVT prophylaxis.  He benefited maximally from the hospital stay and there were no complications.    Recent vital signs:  Filed Vitals:   11/13/15 0737 11/13/15 1507  BP: 152/52 131/53  Pulse: 69 72  Temp: 99.9 F (37.7 C) 99.7 F (37.6 C)  Resp: 20 18    Recent laboratory studies:  Lab Results  Component Value Date   HGB 10.5* 11/13/2015   HGB 11.2* 11/12/2015   HGB 11.7* 11/11/2015   Lab Results  Component Value Date   WBC 9.1 11/13/2015   PLT 147* 11/13/2015   Lab Results  Component Value Date   INR 1.20 11/13/2015   Lab Results  Component Value Date   NA 134* 11/13/2015   K 3.9 11/13/2015   CL 101 11/13/2015   CO2 25 11/13/2015  BUN 15 11/13/2015   CREATININE 0.98 11/13/2015   GLUCOSE 185* 11/13/2015    Discharge Medications:     Medication List    STOP taking these medications        HYDROcodone-acetaminophen 10-325 MG tablet  Commonly known as:  NORCO      TAKE these medications        amLODipine 10 MG tablet  Commonly known as:  NORVASC  Take 1 tablet by mouth daily.     aspirin EC 81 MG tablet  Take 1 tablet by mouth daily.     BENTYL 10 MG capsule  Generic drug:  dicyclomine  Take 10 mg by mouth 4 (four) times daily.     carvedilol 25 MG tablet  Commonly known as:  COREG  Take 25 mg by mouth 2 (two) times daily with a meal.     clopidogrel 75 MG tablet  Commonly known as:  PLAVIX  Take 75 mg by mouth daily.     colchicine 0.6 MG tablet   Take 1 tablet by mouth daily.     diclofenac sodium 1 % Gel  Commonly known as:  VOLTAREN  Apply 2 g topically 4 (four) times daily.     Fish Oil 1000 MG Caps  Take 1 capsule by mouth at bedtime.     furosemide 20 MG tablet  Commonly known as:  LASIX  Take 20-40 mg by mouth daily. 20 one day and 40 the next day. Alternate daily. Had 34m on 07-02-2015     gabapentin 300 MG capsule  Commonly known as:  NEURONTIN  Take 1 capsule by mouth 3 (three) times daily.     HUMALOG MIX 75/25 KWIKPEN (75-25) 100 UNIT/ML Kwikpen  Generic drug:  Insulin Lispro Prot & Lispro  Inject 30-44 Units into the skin 2 (two) times daily. 30 units every morning and 44 units in the evening.     hydrALAZINE 100 MG tablet  Commonly known as:  APRESOLINE  Take 1 tablet by mouth 3 (three) times daily.     isosorbide mononitrate 120 MG 24 hr tablet  Commonly known as:  IMDUR  Take 1 tablet by mouth daily.     JANUVIA 100 MG tablet  Generic drug:  sitaGLIPtin  Take 1 tablet by mouth daily.     LYRICA 75 MG capsule  Generic drug:  pregabalin  Take 75 mg by mouth 2 (two) times daily.     pregabalin 75 MG capsule  Commonly known as:  LYRICA  Take 1 capsule (75 mg total) by mouth 2 (two) times daily.     metFORMIN 1000 MG tablet  Commonly known as:  GLUCOPHAGE  Take 1 tablet by mouth 2 (two) times daily.     MULTI-VITAMINS Tabs  Take 1 tablet by mouth daily.     nitroGLYCERIN 0.4 MG SL tablet  Commonly known as:  NITROSTAT  Place 1 tablet under the tongue every 5 (five) minutes x 3 doses as needed. For chest pain.  Call 911 if no relief after 3 tablets     omeprazole 40 MG capsule  Commonly known as:  PRILOSEC  Take 1 capsule by mouth 2 (two) times daily.     oxyCODONE 5 MG immediate release tablet  Commonly known as:  Oxy IR/ROXICODONE  Take 1-2 tablets (5-10 mg total) by mouth every 3 (three) hours as needed for breakthrough pain.     potassium chloride 10 MEQ tablet  Commonly known as:   K-DUR,KLOR-CON  Take  10 mEq by mouth 2 (two) times daily.     pravastatin 40 MG tablet  Commonly known as:  PRAVACHOL  Take 40 mg by mouth daily.     probenecid 500 MG tablet  Commonly known as:  BENEMID  Take 1 tablet by mouth 2 (two) times daily.     tamsulosin 0.4 MG Caps capsule  Commonly known as:  FLOMAX  Take 1 capsule by mouth daily.     vitamin C 500 MG tablet  Commonly known as:  ASCORBIC ACID  Take 1,000 mg by mouth daily.     Vitamin E 400 units Tabs  Take 400 Units by mouth 2 (two) times daily.     warfarin 1 MG tablet  Commonly known as:  COUMADIN  Take 1 mg by mouth daily.     warfarin 3 MG tablet  Commonly known as:  COUMADIN  Take 3 mg by mouth one time only at 6 PM. Total of 4 mg daily        Diagnostic Studies: Dg Hip Operative Unilat W Or W/o Pelvis Left  11/11/2015  CLINICAL DATA:  Left hip replacement EXAM: OPERATIVE LEFT HIP WITH PELVIS COMPARISON:  None. FLUOROSCOPY TIME:  Radiation Exposure Index (as provided by the fluoroscopic device): Not available If the device does not provide the exposure index: Fluoroscopy Time:  35 seconds Number of Acquired Images:  2 FINDINGS: A left hip replacement is noted in satisfactory position. No acute bony abnormality or soft tissue abnormality is seen. IMPRESSION: Left hip replacement Electronically Signed   By: Inez Catalina M.D.   On: 11/11/2015 10:13   Dg Hip Unilat W Or W/o Pelvis 2-3 Views Left  11/11/2015  CLINICAL DATA:  Left hip replacement EXAM: DG HIP (WITH OR WITHOUT PELVIS) 2-3V LEFT COMPARISON:  Intraoperative film same day FINDINGS: Two views of the left hip submitted. Again noted left hip prosthesis with anatomic alignment. Postsurgical changes are noted with postsurgical drain. Anterolateral skin staples are noted. IMPRESSION: Left hip prosthesis with anatomic alignment. Postsurgical changes are noted Electronically Signed   By: Lahoma Crocker M.D.   On: 11/11/2015 10:46    Disposition: SNF- Patient will  need daily INR checks. Adjust Coumadin as needed.       Follow-up Information    Follow up with [facility] .   Specialty:  [place] information:   [place] [phone]      Follow up with MENZ,MICHAEL, MD In 2 weeks.   Specialty:  Orthopedic Surgery   Why:  For staple removal and skin check   Contact information:   [address] [place] [address] [phone]        Signed: Feliberto Gottron 11/13/2015, 3:22 PM

## 2015-11-13 NOTE — Progress Notes (Signed)
Plan is for patient to D/C to [facility] tomorrow 11/14/15 pending medical clearance. [facility] admissions coordinator at [facility] is aware of above. Clinical Social Worker (CSW) will continue to follow and assist as needed.   Blima Rich, LCSW [phone]

## 2015-11-13 NOTE — Progress Notes (Signed)
Physical Therapy Treatment Patient Details Name: Chase Cabrera MRN: [id number] DOB: [birth year] Today's Date: 11/13/2015    History of Present Illness This patient is a 70 year old male who came to [facility] for a L THR (anterior aproach).    PT Comments    Pt is making gradual progress with therapy goals with increased reps on supine there-ex. Pt limited in distance at this time and required education on benefit of mobility. Pt "gives up" during ambulation, needing recliner brought closer to him to sit. Pt also with unsafe technique during ambulation despite verbal/tactile cues for sequencing of rw. Pt reports decreased pain this date, however increases with exertion.  Follow Up Recommendations  SNF     Equipment Recommendations       Recommendations for Other Services       Precautions / Restrictions Precautions Precautions: Fall;Anterior Hip Precaution Booklet Issued: Yes (comment) Restrictions Weight Bearing Restrictions: Yes LLE Weight Bearing: Weight bearing as tolerated    Mobility  Bed Mobility Overal bed mobility: Needs Assistance Bed Mobility: Supine to Sit     Supine to sit: Mod assist     General bed mobility comments: assist for L LE. Pt needs heavy cues to assist with trunk support. Once seated, pt able to scoot towards EOB with cga.   Transfers Overall transfer level: Needs assistance Equipment used: Rolling walker (2 wheeled) Transfers: Sit to/from Stand Sit to Stand: Mod assist         General transfer comment: assist for standing including technique for hand placement. Once standing, pt demonstrates forward flexed posture, however is able to self correct with min assist. Pt stands for 1 min and then requests to sit. Heavy cues/encouragement for further standing attempts.   Ambulation/Gait Ambulation/Gait assistance: Min assist Ambulation Distance (Feet): 10 Feet Assistive device: Rolling walker (2 wheeled) Gait Pattern/deviations: Step-to  pattern     General Gait Details: ambulated with step to gait pattern and forward flexed posture. Loud verbal cues required for gait sequencing as he tries to move feet before rw. RN needed for +2 assist as pt just "gives up" and requests to sit in recliner. Chair brought up to pt.   Stairs            Wheelchair Mobility    Modified Rankin (Stroke Patients Only)       Balance                                    Cognition Arousal/Alertness: Awake/alert Behavior During Therapy: WFL for tasks assessed/performed Overall Cognitive Status: Within Functional Limits for tasks assessed                      Exercises Other Exercises Other Exercises: supine ther-ex performed including L hip 15 reps on ankle pumps, quad sets, SLRs, hip abd/add, SAQ, and LAQ. All ther-ex performed with min assist for technique    General Comments        Pertinent Vitals/Pain Pain Assessment: 0-10 Pain Score: 2  Pain Location: L hip at rest and increases to 10/10 with exertion Pain Descriptors / Indicators: Operative site guarding Pain Intervention(s): Limited activity within patient's tolerance    Home Living                      Prior Function            PT Goals (current  goals can now be found in the care plan section) Acute Rehab PT Goals Patient Stated Goal: to get stronger PT Goal Formulation: With patient Time For Goal Achievement: 11/25/15 Potential to Achieve Goals: Good Progress towards PT goals: Progressing toward goals    Frequency  BID    PT Plan Current plan remains appropriate    Co-evaluation             End of Session Equipment Utilized During Treatment: Gait belt Activity Tolerance: Patient limited by pain Patient left: in chair;with chair alarm set     Time: 8118-8677 PT Time Calculation (min) (ACUTE ONLY): 25 min  Charges:  $Gait Training: 8-22 mins $Therapeutic Exercise: 8-22 mins                    G Codes:       Analilia Geddis 12/09/2015, 12:57 PM  Greggory Stallion, PT, DPT [phone]

## 2015-11-13 NOTE — Progress Notes (Addendum)
   Subjective: 2 Days Post-Op Procedure(s) (LRB): TOTAL HIP ARTHROPLASTY ANTERIOR APPROACH (Left) Patient reports pain as 2 on 0-10 scale.   Patient is well, and has had no acute complaints or problems Denies any CP, SOB, ABD pain. We will continue therapy today.  Plan is to go Rehab after hospital stay.  Objective: Vital signs in last 24 hours: Temp:  [98.4 F (36.9 C)-100.7 F (38.2 C)] 99.9 F (37.7 C) (03/02 0737) Pulse Rate:  [66-72] 69 (03/02 0737) Resp:  [18-20] 20 (03/02 0737) BP: (128-152)/(52-62) 152/52 mmHg (03/02 0737) SpO2:  [94 %-95 %] 94 % (03/02 0737)  Intake/Output from previous day: 03/01 0701 - 03/02 0700 In: 750 [P.O.:750] Out: 755 [Urine:725; Drains:30] Intake/Output this shift:     Recent Labs  11/11/15 1252 11/12/15 0448 11/13/15 0513  HGB 11.7* 11.2* 10.5*    Recent Labs  11/12/15 0448 11/13/15 0513  WBC 9.9 9.1  RBC 3.49* 3.28*  HCT 32.1* 30.1*  PLT 159 147*    Recent Labs  11/13/15 0513  NA 134*  K 3.9  CL 101  CO2 25  BUN 15  CREATININE 0.98  GLUCOSE 185*  CALCIUM 8.3*    Recent Labs  11/12/15 0448 11/13/15 0513  INR 1.14 1.20    EXAM General - Patient is Alert, Appropriate and Oriented Extremity - Neurovascular intact Sensation intact distally Intact pulses distally Dorsiflexion/Plantar flexion intact Dressing - dressing C/D/I, scant drainage and hemovac removed Motor Function - intact, moving foot and toes well on exam.   Past Medical History  Diagnosis Date  . Myocardial infarction ([place])   . Arthritis   . Diabetes mellitus without complication ([place])   . Low blood potassium   . Hypertension   . CHF (congestive heart failure) ([place])   . Cancer ([place])     skin face  . Hyperlipemia   . Atrial fibrillation ([place])   . Spinal stenosis   . Renal artery stenosis ([place])   . Corneal dystrophy     bilateral  . Hyponatremia   . Urinary retention   . Gout   . Cough, persistent     Assessment/Plan:   2 Days  Post-Op Procedure(s) (LRB): TOTAL HIP ARTHROPLASTY ANTERIOR APPROACH (Left) Active Problems:   Primary osteoarthritis of left hip  Estimated body mass index is 32.78 kg/(m^2) as calculated from the following:   Height as of this encounter: 5' 6"  (1.676 m).   Weight as of this encounter: 203 lb (92.08 kg). Advance diet Up with therapy  Needs BM Pharmacy assisting with managing Coumadin, recheck INR in the am   DVT Prophylaxis - Aspirin and Plavix, coumadin Weight-Bearing as tolerated to left leg D/C O2 and Pulse OX and try on Room Air  T. Rachelle Hora, PA-C [place] 11/13/2015, 8:38 AM

## 2015-11-13 NOTE — Progress Notes (Signed)
Physical Therapy Treatment Patient Details Name: Chase Cabrera MRN: [id number] DOB: 1946/07/10 Today's Date: 11/13/2015    History of Present Illness This patient is a 70 year old male who came to [facility] for a L THR (anterior aproach).    PT Comments    Pt is making good progress this session with improved ambulation distance noted. Pt requires +2 stand by-assist for safety and actually does better than with +1. Pt required 1 standing rest break and needs constant cues for sequencing of rw. Good endurance with there-ex.  Follow Up Recommendations  SNF     Equipment Recommendations       Recommendations for Other Services       Precautions / Restrictions Precautions Precautions: Fall;Anterior Hip Precaution Booklet Issued: Yes (comment) Restrictions Weight Bearing Restrictions: Yes LLE Weight Bearing: Weight bearing as tolerated    Mobility  Bed Mobility Overal bed mobility: Needs Assistance Bed Mobility: Sit to Supine     Supine to sit: Mod assist     General bed mobility comments: assist for L LE. +2 assist for trunk support  Transfers Overall transfer level: Needs assistance Equipment used: Rolling walker (2 wheeled) Transfers: Sit to/from Stand Sit to Stand: Mod assist         General transfer comment: assist for rw and correct hand placement.Once standing, pt cued for upright posture.  Ambulation/Gait Ambulation/Gait assistance: Min assist Ambulation Distance (Feet): 40 Feet Assistive device: Rolling walker (2 wheeled) Gait Pattern/deviations: Step-to pattern     General Gait Details: ambulated using step to gait pattern and safe technique. Pt demonstrates slow gait speed with 1 standing rest break. Pt requires heavy encouragement for participation. Pt also able to perform side stepping up towards [facility] for improve positioning.   Stairs            Wheelchair Mobility    Modified Rankin (Stroke Patients Only)       Balance                                    Cognition Arousal/Alertness: Awake/alert Behavior During Therapy: WFL for tasks assessed/performed Overall Cognitive Status: Within Functional Limits for tasks assessed                      Exercises Other Exercises Other Exercises: seated ther-ex performed on L LE including ankle pumps, quad sets, glut sets, SAQ, LAQ, SLRs, and hip abd/add. All ther-ex performed x 15 reps with min assist for correct technique    General Comments        Pertinent Vitals/Pain Pain Assessment: 0-10 Pain Score: 10-Worst pain ever Pain Location: L hip Pain Descriptors / Indicators: Operative site guarding Pain Intervention(s): Limited activity within patient's tolerance    Home Living                      Prior Function            PT Goals (current goals can now be found in the care plan section) Acute Rehab PT Goals Patient Stated Goal: to get stronger PT Goal Formulation: With patient Time For Goal Achievement: 11/25/15 Potential to Achieve Goals: Good Progress towards PT goals: Progressing toward goals    Frequency  BID    PT Plan Current plan remains appropriate    Co-evaluation             End of Session  Equipment Utilized During Treatment: Gait belt Activity Tolerance: Patient limited by pain Patient left: in bed;with bed alarm set     Time: 6244-6950 PT Time Calculation (min) (ACUTE ONLY): 27 min  Charges:  $Gait Training: 8-22 mins $Therapeutic Exercise: 8-22 mins                    G Codes:      Davonne Baby December 05, 2015, 5:00 PM  Greggory Stallion, PT, DPT [phone]

## 2015-11-13 NOTE — Care Management Important Message (Signed)
Important Message  Patient Details  Name: Chase Cabrera MRN: [id number] Date of Birth: 12-14-1945   Medicare Important Message Given:  Yes    Juliann Pulse A Arma Reining 11/13/2015, 10:26 AM

## 2015-11-13 NOTE — Progress Notes (Signed)
rn spoke with chris gaines,pa  Re; meds for constipation. md ordered dulcolax supp 11m pr qd prn, and milk of magnesia 330mqd prn

## 2015-11-14 DIAGNOSIS — I2510 Atherosclerotic heart disease of native coronary artery without angina pectoris: Secondary | ICD-10-CM | POA: Diagnosis present

## 2015-11-14 DIAGNOSIS — Z7982 Long term (current) use of aspirin: Secondary | ICD-10-CM | POA: Diagnosis not present

## 2015-11-14 DIAGNOSIS — M25559 Pain in unspecified hip: Secondary | ICD-10-CM | POA: Diagnosis not present

## 2015-11-14 DIAGNOSIS — M4800 Spinal stenosis, site unspecified: Secondary | ICD-10-CM | POA: Diagnosis not present

## 2015-11-14 DIAGNOSIS — J181 Lobar pneumonia, unspecified organism: Secondary | ICD-10-CM | POA: Diagnosis not present

## 2015-11-14 DIAGNOSIS — W19XXXA Unspecified fall, initial encounter: Secondary | ICD-10-CM | POA: Diagnosis not present

## 2015-11-14 DIAGNOSIS — N400 Enlarged prostate without lower urinary tract symptoms: Secondary | ICD-10-CM | POA: Diagnosis present

## 2015-11-14 DIAGNOSIS — E119 Type 2 diabetes mellitus without complications: Secondary | ICD-10-CM | POA: Diagnosis not present

## 2015-11-14 DIAGNOSIS — I4891 Unspecified atrial fibrillation: Secondary | ICD-10-CM | POA: Diagnosis not present

## 2015-11-14 DIAGNOSIS — Z801 Family history of malignant neoplasm of trachea, bronchus and lung: Secondary | ICD-10-CM | POA: Diagnosis not present

## 2015-11-14 DIAGNOSIS — Z79899 Other long term (current) drug therapy: Secondary | ICD-10-CM | POA: Diagnosis not present

## 2015-11-14 DIAGNOSIS — Z800 Family history of malignant neoplasm of digestive organs: Secondary | ICD-10-CM | POA: Diagnosis not present

## 2015-11-14 DIAGNOSIS — R339 Retention of urine, unspecified: Secondary | ICD-10-CM | POA: Diagnosis not present

## 2015-11-14 DIAGNOSIS — Z951 Presence of aortocoronary bypass graft: Secondary | ICD-10-CM | POA: Diagnosis not present

## 2015-11-14 DIAGNOSIS — Z794 Long term (current) use of insulin: Secondary | ICD-10-CM | POA: Diagnosis not present

## 2015-11-14 DIAGNOSIS — E1140 Type 2 diabetes mellitus with diabetic neuropathy, unspecified: Secondary | ICD-10-CM | POA: Diagnosis present

## 2015-11-14 DIAGNOSIS — J189 Pneumonia, unspecified organism: Secondary | ICD-10-CM | POA: Diagnosis present

## 2015-11-14 DIAGNOSIS — Z471 Aftercare following joint replacement surgery: Secondary | ICD-10-CM | POA: Diagnosis not present

## 2015-11-14 DIAGNOSIS — Z4789 Encounter for other orthopedic aftercare: Secondary | ICD-10-CM | POA: Diagnosis not present

## 2015-11-14 DIAGNOSIS — I252 Old myocardial infarction: Secondary | ICD-10-CM | POA: Diagnosis not present

## 2015-11-14 DIAGNOSIS — J9601 Acute respiratory failure with hypoxia: Secondary | ICD-10-CM | POA: Diagnosis present

## 2015-11-14 DIAGNOSIS — I701 Atherosclerosis of renal artery: Secondary | ICD-10-CM | POA: Diagnosis present

## 2015-11-14 DIAGNOSIS — Z87891 Personal history of nicotine dependence: Secondary | ICD-10-CM | POA: Diagnosis not present

## 2015-11-14 DIAGNOSIS — I482 Chronic atrial fibrillation: Secondary | ICD-10-CM | POA: Diagnosis present

## 2015-11-14 DIAGNOSIS — R05 Cough: Secondary | ICD-10-CM | POA: Diagnosis not present

## 2015-11-14 DIAGNOSIS — I10 Essential (primary) hypertension: Secondary | ICD-10-CM | POA: Diagnosis present

## 2015-11-14 DIAGNOSIS — M6281 Muscle weakness (generalized): Secondary | ICD-10-CM | POA: Diagnosis not present

## 2015-11-14 DIAGNOSIS — Z96642 Presence of left artificial hip joint: Secondary | ICD-10-CM | POA: Diagnosis present

## 2015-11-14 DIAGNOSIS — I509 Heart failure, unspecified: Secondary | ICD-10-CM | POA: Diagnosis present

## 2015-11-14 DIAGNOSIS — Z96641 Presence of right artificial hip joint: Secondary | ICD-10-CM | POA: Diagnosis not present

## 2015-11-14 DIAGNOSIS — M109 Gout, unspecified: Secondary | ICD-10-CM | POA: Diagnosis present

## 2015-11-14 DIAGNOSIS — Y95 Nosocomial condition: Secondary | ICD-10-CM | POA: Diagnosis present

## 2015-11-14 DIAGNOSIS — M1990 Unspecified osteoarthritis, unspecified site: Secondary | ICD-10-CM | POA: Diagnosis present

## 2015-11-14 DIAGNOSIS — Z823 Family history of stroke: Secondary | ICD-10-CM | POA: Diagnosis not present

## 2015-11-14 DIAGNOSIS — N401 Enlarged prostate with lower urinary tract symptoms: Secondary | ICD-10-CM | POA: Diagnosis not present

## 2015-11-14 DIAGNOSIS — M25552 Pain in left hip: Secondary | ICD-10-CM | POA: Diagnosis present

## 2015-11-14 DIAGNOSIS — Z955 Presence of coronary angioplasty implant and graft: Secondary | ICD-10-CM | POA: Diagnosis not present

## 2015-11-14 DIAGNOSIS — E785 Hyperlipidemia, unspecified: Secondary | ICD-10-CM | POA: Diagnosis present

## 2015-11-14 DIAGNOSIS — K219 Gastro-esophageal reflux disease without esophagitis: Secondary | ICD-10-CM | POA: Diagnosis present

## 2015-11-14 LAB — GLUCOSE, CAPILLARY: GLUCOSE-CAPILLARY: 181 mg/dL — AB (ref 65–99)

## 2015-11-14 LAB — PROTIME-INR
INR: 1.18
PROTHROMBIN TIME: 15.2 s — AB (ref 11.4–15.0)

## 2015-11-14 NOTE — Plan of Care (Signed)
Problem: Pain Management: Goal: Pain level will decrease with appropriate interventions Outcome: Progressing Patient rested well. Difficulty moving when trying to position him on his side. Two person assist to the bedside commode with a walker.

## 2015-11-14 NOTE — Progress Notes (Signed)
Physical Therapy Treatment Patient Details Name: Chase Cabrera MRN: [id number] DOB: Jan 16, 1946 Today's Date: 11/14/2015    History of Present Illness This patient is a 70 year old male who came to [facility] for a L THR (anterior aproach).    PT Comments    Pt is making gradual progress towards goals. Limited ambulation performed this date secondary to increased pain. +2 assist required for safety and heavy cues needed for correct gait sequencing. Good endurance noted with there-ex.  Follow Up Recommendations  SNF     Equipment Recommendations       Recommendations for Other Services       Precautions / Restrictions Precautions Precautions: Fall;Anterior Hip Precaution Booklet Issued: Yes (comment) Restrictions Weight Bearing Restrictions: No LLE Weight Bearing: Weight bearing as tolerated    Mobility  Bed Mobility               General bed mobility comments: not performed as pt received in recliner  Transfers Overall transfer level: Needs assistance Equipment used: Rolling walker (2 wheeled) Transfers: Sit to/from Stand Sit to Stand: Mod assist         General transfer comment: assist for rw and +2 needed for safety. Once standing, pt cued for upright posture  Ambulation/Gait Ambulation/Gait assistance: Min assist Ambulation Distance (Feet): 10 Feet Assistive device: Rolling walker (2 wheeled) Gait Pattern/deviations: Step-to pattern     General Gait Details: Pt needs heavy cues for sequencing of gait pattern. Pt stops mid way through ambulation and reports he needs to sit down. Recliner brought up towards pt. Unable to further progress ambulation distance.   Stairs            Wheelchair Mobility    Modified Rankin (Stroke Patients Only)       Balance                                    Cognition Arousal/Alertness: Awake/alert Behavior During Therapy: WFL for tasks assessed/performed Overall Cognitive Status: Within  Functional Limits for tasks assessed                      Exercises Other Exercises Other Exercises: Seated ther-ex performed on L LE including ankle pumps, quad sets, glut sets, hip abd/add, SLRs, SAQ, and LAQ. All ther-ex performed x 15 reps with cues for sequencing    General Comments        Pertinent Vitals/Pain Pain Assessment: 0-10 Pain Score: 10-Worst pain ever Pain Location: L hip Pain Descriptors / Indicators: Operative site guarding Pain Intervention(s): Limited activity within patient's tolerance    Home Living                      Prior Function            PT Goals (current goals can now be found in the care plan section) Acute Rehab PT Goals Patient Stated Goal: to get stronger PT Goal Formulation: With patient Time For Goal Achievement: 11/25/15 Potential to Achieve Goals: Good Progress towards PT goals: Progressing toward goals    Frequency  BID    PT Plan Current plan remains appropriate    Co-evaluation             End of Session Equipment Utilized During Treatment: Gait belt Activity Tolerance: Patient limited by pain Patient left: in chair;with chair alarm set     Time: [phone]  PT Time Calculation (min) (ACUTE ONLY): 15 min  Charges:  $Gait Training: 8-22 mins $Therapeutic Exercise: 8-22 mins                    G Codes:      Taima Rada 11/19/2015, 10:12 AM Greggory Stallion, PT, DPT [phone]

## 2015-11-14 NOTE — Progress Notes (Signed)
Patient is medically stable for D/C to [facility] today. Per [facility] admissions coordinator at [facility] patient will go to room 502. RN will call report to 500 hall RN and arrange EMS for transport. Clinical Education officer, museum (CSW) sent D/C Summary, FL2 and D/C Packet to [employer] via [employer]. Patient is aware of above. Patient's wife [name] is at bedside and aware of above. Please reconsult it future social work needs arise. CSW signing off.   Blima Rich, LCSW [phone]

## 2015-11-14 NOTE — Progress Notes (Addendum)
Patient is alert and oriented, VSS. Minimal pain at surgical site.  Covered with oxycodone.    Able to ambulate with walker to bedside commode and chair.    Changed dressing on Left Hip, 19 staples underneath, covered with honeycomb dressing.    Wife at bedside.  Called report to [facility] and called EMS.    EMS transferring to facility.

## 2015-11-14 NOTE — Clinical Social Work Placement (Addendum)
   CLINICAL SOCIAL WORK PLACEMENT  NOTE  Date:  11/14/2015  Patient Details  Name: NIGUEL MOURE MRN: [id number] Date of Birth: 1946-07-28  Clinical Social Work is seeking post-discharge placement for this patient at the [place] level of care (*CSW will initial, date and re-position this form in  chart as items are completed):  Yes   Patient/family provided with [place] Work Department's list of facilities offering this level of care within the geographic area requested by the patient (or if unable, by the patient's family).  Yes   Patient/family informed of their freedom to choose among providers that offer the needed level of care, that participate in Medicare, Medicaid or managed care program needed by the patient, have an available bed and are willing to accept the patient.  Yes   Patient/family informed of [facility]'s ownership interest in [facility] and [facility], as well as of the fact that they are under no obligation to receive care at these facilities.  PASRR submitted to EDS on 11/12/15     PASRR number received on 11/12/15     Existing PASRR number confirmed on       FL2 transmitted to all facilities in geographic area requested by pt/family on 11/12/15     FL2 transmitted to all facilities within larger geographic area on       Patient informed that his/her managed care company has contracts with or will negotiate with certain facilities, including the following:        Yes   Patient/family informed of bed offers received.  Patient chooses bed at  [facility] )     Physician recommends and patient chooses bed at      Patient to be transferred to  [facility] ) on 11/14/15.  Patient to be transferred to facility by  [facility] )     Patient family notified on 11/14/15 of transfer.  Name of family member notified: Patient's wife [name] is at bedside and aware of D/C today.      PHYSICIAN    Additional Comment:    _______________________________________________ Loralyn Freshwater, LCSW 11/14/2015, 9:34 AM

## 2015-11-14 NOTE — Progress Notes (Signed)
   Subjective: 3 Days Post-Op Procedure(s) (LRB): TOTAL HIP ARTHROPLASTY ANTERIOR APPROACH (Left) Patient reports pain as 2 on 0-10 scale.   Patient is well, and has had no acute complaints or problems Denies any CP, SOB, ABD pain. We will continue therapy today.  Plan is to go Rehab after hospital stay.  Objective: Vital signs in last 24 hours: Temp:  [98.8 F (37.1 C)-100.7 F (38.2 C)] 98.8 F (37.1 C) (03/03 0736) Pulse Rate:  [63-72] 63 (03/03 0736) Resp:  [18-34] 18 (03/03 0736) BP: (130-146)/(49-53) 131/49 mmHg (03/03 0736) SpO2:  [92 %-94 %] 93 % (03/03 0736)  Intake/Output from previous day: 03/02 0701 - 03/03 0700 In: 480 [P.O.:480] Out: 500 [Urine:500] Intake/Output this shift:     Recent Labs  11/11/15 1252 11/12/15 0448 11/13/15 0513  HGB 11.7* 11.2* 10.5*    Recent Labs  11/12/15 0448 11/13/15 0513  WBC 9.9 9.1  RBC 3.49* 3.28*  HCT 32.1* 30.1*  PLT 159 147*    Recent Labs  11/13/15 0513  NA 134*  K 3.9  CL 101  CO2 25  BUN 15  CREATININE 0.98  GLUCOSE 185*  CALCIUM 8.3*    Recent Labs  11/13/15 0513 11/14/15 0516  INR 1.20 1.18    EXAM General - Patient is Alert, Appropriate and Oriented Extremity - Neurovascular intact Sensation intact distally Intact pulses distally Dorsiflexion/Plantar flexion intact Dressing - dressing C/D/I and scant drainage Motor Function - intact, moving foot and toes well on exam.   Past Medical History  Diagnosis Date  . Myocardial infarction ([place])   . Arthritis   . Diabetes mellitus without complication ([place])   . Low blood potassium   . Hypertension   . CHF (congestive heart failure) ([place])   . Cancer ([place])     skin face  . Hyperlipemia   . Atrial fibrillation ([place])   . Spinal stenosis   . Renal artery stenosis ([place])   . Corneal dystrophy     bilateral  . Hyponatremia   . Urinary retention   . Gout   . Cough, persistent     Assessment/Plan:   3 Days Post-Op Procedure(s)  (LRB): TOTAL HIP ARTHROPLASTY ANTERIOR APPROACH (Left) Active Problems:   Primary osteoarthritis of left hip  Estimated body mass index is 32.78 kg/(m^2) as calculated from the following:   Height as of this encounter: 5' 6"  (1.676 m).   Weight as of this encounter: 92.08 kg (203 lb). Advance diet Up with therapy  Discharge to SNF today Follow up with [facility] ortho in 2 weeks Patient will need daily INR until therapeutic at SNF   DVT Prophylaxis - Aspirin and Plavix, coumadin Weight-Bearing as tolerated to left leg D/C O2 and Pulse OX and try on Room Air  T. Rachelle Hora, PA-C [place] 11/14/2015, 7:42 AM

## 2015-11-17 ENCOUNTER — Encounter: Payer: Self-pay | Admitting: Emergency Medicine

## 2015-11-17 ENCOUNTER — Emergency Department: Payer: Medicare Other

## 2015-11-17 ENCOUNTER — Inpatient Hospital Stay
Admission: EM | Admit: 2015-11-17 | Discharge: 2015-11-18 | DRG: 193 | Disposition: A | Discharge status: Skilled Nursing Facility | Payer: Medicare Other | Attending: Specialist | Admitting: Specialist

## 2015-11-17 DIAGNOSIS — J189 Pneumonia, unspecified organism: Secondary | ICD-10-CM | POA: Diagnosis present

## 2015-11-17 DIAGNOSIS — Z955 Presence of coronary angioplasty implant and graft: Secondary | ICD-10-CM

## 2015-11-17 DIAGNOSIS — J9601 Acute respiratory failure with hypoxia: Secondary | ICD-10-CM | POA: Diagnosis present

## 2015-11-17 DIAGNOSIS — I2510 Atherosclerotic heart disease of native coronary artery without angina pectoris: Secondary | ICD-10-CM | POA: Diagnosis present

## 2015-11-17 DIAGNOSIS — N400 Enlarged prostate without lower urinary tract symptoms: Secondary | ICD-10-CM | POA: Diagnosis present

## 2015-11-17 DIAGNOSIS — I701 Atherosclerosis of renal artery: Secondary | ICD-10-CM | POA: Diagnosis present

## 2015-11-17 DIAGNOSIS — M25552 Pain in left hip: Secondary | ICD-10-CM | POA: Diagnosis present

## 2015-11-17 DIAGNOSIS — M1990 Unspecified osteoarthritis, unspecified site: Secondary | ICD-10-CM | POA: Diagnosis present

## 2015-11-17 DIAGNOSIS — Z87891 Personal history of nicotine dependence: Secondary | ICD-10-CM | POA: Diagnosis not present

## 2015-11-17 DIAGNOSIS — I509 Heart failure, unspecified: Secondary | ICD-10-CM | POA: Diagnosis present

## 2015-11-17 DIAGNOSIS — W19XXXA Unspecified fall, initial encounter: Secondary | ICD-10-CM | POA: Diagnosis present

## 2015-11-17 DIAGNOSIS — R339 Retention of urine, unspecified: Secondary | ICD-10-CM | POA: Diagnosis not present

## 2015-11-17 DIAGNOSIS — M25559 Pain in unspecified hip: Secondary | ICD-10-CM | POA: Diagnosis not present

## 2015-11-17 DIAGNOSIS — R05 Cough: Secondary | ICD-10-CM | POA: Diagnosis not present

## 2015-11-17 DIAGNOSIS — E1140 Type 2 diabetes mellitus with diabetic neuropathy, unspecified: Secondary | ICD-10-CM | POA: Diagnosis present

## 2015-11-17 DIAGNOSIS — E785 Hyperlipidemia, unspecified: Secondary | ICD-10-CM | POA: Diagnosis present

## 2015-11-17 DIAGNOSIS — E119 Type 2 diabetes mellitus without complications: Secondary | ICD-10-CM | POA: Diagnosis not present

## 2015-11-17 DIAGNOSIS — I252 Old myocardial infarction: Secondary | ICD-10-CM

## 2015-11-17 DIAGNOSIS — Z96641 Presence of right artificial hip joint: Secondary | ICD-10-CM | POA: Diagnosis not present

## 2015-11-17 DIAGNOSIS — M109 Gout, unspecified: Secondary | ICD-10-CM | POA: Diagnosis present

## 2015-11-17 DIAGNOSIS — M6281 Muscle weakness (generalized): Secondary | ICD-10-CM | POA: Diagnosis not present

## 2015-11-17 DIAGNOSIS — Z794 Long term (current) use of insulin: Secondary | ICD-10-CM | POA: Diagnosis not present

## 2015-11-17 DIAGNOSIS — Y95 Nosocomial condition: Secondary | ICD-10-CM | POA: Diagnosis present

## 2015-11-17 DIAGNOSIS — Z800 Family history of malignant neoplasm of digestive organs: Secondary | ICD-10-CM | POA: Diagnosis not present

## 2015-11-17 DIAGNOSIS — Z4789 Encounter for other orthopedic aftercare: Secondary | ICD-10-CM | POA: Diagnosis not present

## 2015-11-17 DIAGNOSIS — I482 Chronic atrial fibrillation: Secondary | ICD-10-CM | POA: Diagnosis present

## 2015-11-17 DIAGNOSIS — Z7982 Long term (current) use of aspirin: Secondary | ICD-10-CM | POA: Diagnosis not present

## 2015-11-17 DIAGNOSIS — K219 Gastro-esophageal reflux disease without esophagitis: Secondary | ICD-10-CM | POA: Diagnosis present

## 2015-11-17 DIAGNOSIS — Z951 Presence of aortocoronary bypass graft: Secondary | ICD-10-CM | POA: Diagnosis not present

## 2015-11-17 DIAGNOSIS — Z801 Family history of malignant neoplasm of trachea, bronchus and lung: Secondary | ICD-10-CM | POA: Diagnosis not present

## 2015-11-17 DIAGNOSIS — J181 Lobar pneumonia, unspecified organism: Secondary | ICD-10-CM | POA: Diagnosis not present

## 2015-11-17 DIAGNOSIS — Z471 Aftercare following joint replacement surgery: Secondary | ICD-10-CM | POA: Diagnosis not present

## 2015-11-17 DIAGNOSIS — N401 Enlarged prostate with lower urinary tract symptoms: Secondary | ICD-10-CM | POA: Diagnosis not present

## 2015-11-17 DIAGNOSIS — Z79899 Other long term (current) drug therapy: Secondary | ICD-10-CM | POA: Diagnosis not present

## 2015-11-17 DIAGNOSIS — Z823 Family history of stroke: Secondary | ICD-10-CM

## 2015-11-17 DIAGNOSIS — Z96642 Presence of left artificial hip joint: Secondary | ICD-10-CM | POA: Diagnosis present

## 2015-11-17 DIAGNOSIS — I10 Essential (primary) hypertension: Secondary | ICD-10-CM | POA: Diagnosis present

## 2015-11-17 DIAGNOSIS — M4800 Spinal stenosis, site unspecified: Secondary | ICD-10-CM | POA: Diagnosis not present

## 2015-11-17 DIAGNOSIS — I4891 Unspecified atrial fibrillation: Secondary | ICD-10-CM | POA: Diagnosis not present

## 2015-11-17 LAB — COMPREHENSIVE METABOLIC PANEL
ALBUMIN: 3.1 g/dL — AB (ref 3.5–5.0)
ALK PHOS: 90 U/L (ref 38–126)
ALT: 42 U/L (ref 17–63)
AST: 41 U/L (ref 15–41)
Anion gap: 11 (ref 5–15)
BILIRUBIN TOTAL: 1 mg/dL (ref 0.3–1.2)
BUN: 17 mg/dL (ref 6–20)
CALCIUM: 8.5 mg/dL — AB (ref 8.9–10.3)
CO2: 24 mmol/L (ref 22–32)
CREATININE: 0.98 mg/dL (ref 0.61–1.24)
Chloride: 98 mmol/L — ABNORMAL LOW (ref 101–111)
GFR calc Af Amer: >60 mL/min (ref >60)
GLUCOSE: 202 mg/dL — AB (ref 65–99)
POTASSIUM: 3.8 mmol/L (ref 3.5–5.1)
Sodium: 133 mmol/L — ABNORMAL LOW (ref 135–145)
TOTAL PROTEIN: 7.1 g/dL (ref 6.5–8.1)

## 2015-11-17 LAB — CBC
HEMATOCRIT: 30.3 % — AB (ref 40.0–52.0)
Hemoglobin: 10.4 g/dL — ABNORMAL LOW (ref 13.0–18.0)
MCH: 31.9 pg (ref 26.0–34.0)
MCHC: 34.2 g/dL (ref 32.0–36.0)
MCV: 93.2 fL (ref 80.0–100.0)
PLATELETS: 215 10*3/uL (ref 150–440)
RBC: 3.25 MIL/uL — AB (ref 4.40–5.90)
RDW: 16.1 % — ABNORMAL HIGH (ref 11.5–14.5)
WBC: 8.7 10*3/uL (ref 3.8–10.6)

## 2015-11-17 LAB — GLUCOSE, CAPILLARY
GLUCOSE-CAPILLARY: 213 mg/dL — AB (ref 65–99)
GLUCOSE-CAPILLARY: 131 mg/dL — AB (ref 65–99)
Glucose-Capillary: 204 mg/dL — ABNORMAL HIGH (ref 65–99)

## 2015-11-17 LAB — PROTIME-INR
INR: 1.3
PROTHROMBIN TIME: 16.3 s — AB (ref 11.4–15.0)

## 2015-11-17 LAB — TROPONIN I: Troponin I: <0.03 ng/mL (ref <0.031)

## 2015-11-17 MED ORDER — VANCOMYCIN HCL IN DEXTROSE 1-5 GM/200ML-% IV SOLN
1000.0000 mg | Freq: Two times a day (BID) | INTRAVENOUS | Status: DC
  Administered 2015-11-17 – 2015-11-18 (×2): 1000 mg via INTRAVENOUS
  Filled 2015-11-17 (×4): qty 200

## 2015-11-17 MED ORDER — MORPHINE SULFATE (PF) 4 MG/ML IV SOLN
4.0000 mg | Freq: Once | INTRAVENOUS | Status: AC
  Administered 2015-11-17: 4 mg via INTRAVENOUS

## 2015-11-17 MED ORDER — MORPHINE SULFATE (PF) 2 MG/ML IV SOLN: 2.0000 mg | INTRAVENOUS | Status: DC | PRN

## 2015-11-17 MED ORDER — ONDANSETRON HCL 4 MG/2ML IJ SOLN: 4.0000 mg | Freq: Four times a day (QID) | INTRAMUSCULAR | Status: DC | PRN

## 2015-11-17 MED ORDER — DICYCLOMINE HCL 10 MG PO CAPS
10.0000 mg | ORAL_CAPSULE | Freq: Four times a day (QID) | ORAL | Status: DC
  Administered 2015-11-17 – 2015-11-18 (×4): 10 mg via ORAL
  Filled 2015-11-17 (×4): qty 1

## 2015-11-17 MED ORDER — PRAVASTATIN SODIUM 20 MG PO TABS
40.0000 mg | ORAL_TABLET | Freq: Every day | ORAL | Status: DC
  Administered 2015-11-17: 40 mg via ORAL
  Filled 2015-11-17 (×2): qty 2

## 2015-11-17 MED ORDER — WARFARIN SODIUM 4 MG PO TABS
4.0000 mg | ORAL_TABLET | Freq: Every day | ORAL | Status: DC
  Filled 2015-11-17: qty 1

## 2015-11-17 MED ORDER — OMEGA-3-ACID ETHYL ESTERS 1 G PO CAPS
1.0000 g | ORAL_CAPSULE | Freq: Every day | ORAL | Status: DC
  Administered 2015-11-17: 1 g via ORAL
  Filled 2015-11-17: qty 1

## 2015-11-17 MED ORDER — INSULIN LISPRO PROT & LISPRO (75-25 MIX) 100 UNIT/ML KWIKPEN: 30.0000 [IU] | PEN_INJECTOR | Freq: Two times a day (BID) | SUBCUTANEOUS | Status: DC

## 2015-11-17 MED ORDER — PANTOPRAZOLE SODIUM 40 MG PO TBEC
40.0000 mg | DELAYED_RELEASE_TABLET | Freq: Every day | ORAL | Status: DC
  Administered 2015-11-17 – 2015-11-18 (×2): 40 mg via ORAL
  Filled 2015-11-17 (×2): qty 1

## 2015-11-17 MED ORDER — PIPERACILLIN-TAZOBACTAM 3.375 G IVPB
3.3750 g | Freq: Three times a day (TID) | INTRAVENOUS | Status: DC
Start: 2015-11-17 — End: 2015-11-18
  Administered 2015-11-17 – 2015-11-18 (×4): 3.375 g via INTRAVENOUS
  Filled 2015-11-17 (×5): qty 50

## 2015-11-17 MED ORDER — NITROGLYCERIN 0.4 MG SL SUBL: 0.4000 mg | SUBLINGUAL_TABLET | SUBLINGUAL | Status: DC | PRN

## 2015-11-17 MED ORDER — INSULIN ASPART 100 UNIT/ML ~~LOC~~ SOLN
0.0000 [IU] | Freq: Three times a day (TID) | SUBCUTANEOUS | Status: DC
  Administered 2015-11-17 – 2015-11-18 (×3): 5 [IU] via SUBCUTANEOUS
  Administered 2015-11-18: 3 [IU] via SUBCUTANEOUS
  Filled 2015-11-17 (×3): qty 5

## 2015-11-17 MED ORDER — FUROSEMIDE 40 MG PO TABS: 40.0000 mg | ORAL_TABLET | ORAL | Status: DC

## 2015-11-17 MED ORDER — HYDRALAZINE HCL 50 MG PO TABS
100.0000 mg | ORAL_TABLET | Freq: Three times a day (TID) | ORAL | Status: DC
  Filled 2015-11-17: qty 2

## 2015-11-17 MED ORDER — CLOPIDOGREL BISULFATE 75 MG PO TABS
75.0000 mg | ORAL_TABLET | Freq: Every day | ORAL | Status: DC
  Administered 2015-11-17 – 2015-11-18 (×2): 75 mg via ORAL
  Filled 2015-11-17 (×2): qty 1

## 2015-11-17 MED ORDER — ONDANSETRON HCL 4 MG/2ML IJ SOLN
4.0000 mg | Freq: Once | INTRAMUSCULAR | Status: AC
  Administered 2015-11-17: 4 mg via INTRAVENOUS

## 2015-11-17 MED ORDER — INSULIN ASPART PROT & ASPART (70-30 MIX) 100 UNIT/ML ~~LOC~~ SUSP
44.0000 [IU] | Freq: Every day | SUBCUTANEOUS | Status: DC
  Administered 2015-11-17: 44 [IU] via SUBCUTANEOUS
  Filled 2015-11-17: qty 44

## 2015-11-17 MED ORDER — PROBENECID 500 MG PO TABS
500.0000 mg | ORAL_TABLET | Freq: Two times a day (BID) | ORAL | Status: DC
  Administered 2015-11-17 – 2015-11-18 (×2): 500 mg via ORAL
  Filled 2015-11-17 (×4): qty 1

## 2015-11-17 MED ORDER — VITAMIN C 500 MG PO TABS
1000.0000 mg | ORAL_TABLET | Freq: Every day | ORAL | Status: DC
  Administered 2015-11-17: 1000 mg via ORAL
  Filled 2015-11-17 (×2): qty 2

## 2015-11-17 MED ORDER — ASPIRIN EC 81 MG PO TBEC
81.0000 mg | DELAYED_RELEASE_TABLET | Freq: Every day | ORAL | Status: DC
  Administered 2015-11-17 – 2015-11-18 (×2): 81 mg via ORAL
  Filled 2015-11-17 (×2): qty 1

## 2015-11-17 MED ORDER — PREGABALIN 75 MG PO CAPS
75.0000 mg | ORAL_CAPSULE | Freq: Two times a day (BID) | ORAL | Status: DC
  Administered 2015-11-17 – 2015-11-18 (×3): 75 mg via ORAL
  Filled 2015-11-17 (×3): qty 1

## 2015-11-17 MED ORDER — LEVOFLOXACIN IN D5W 750 MG/150ML IV SOLN
750.0000 mg | Freq: Once | INTRAVENOUS | Status: AC
  Administered 2015-11-17: 750 mg via INTRAVENOUS
  Filled 2015-11-17: qty 150

## 2015-11-17 MED ORDER — FUROSEMIDE 20 MG PO TABS
20.0000 mg | ORAL_TABLET | Freq: Every day | ORAL | Status: DC
  Administered 2015-11-17 – 2015-11-18 (×2): 20 mg via ORAL
  Filled 2015-11-17 (×2): qty 1

## 2015-11-17 MED ORDER — ONDANSETRON HCL 4 MG PO TABS: 4.0000 mg | ORAL_TABLET | Freq: Four times a day (QID) | ORAL | Status: DC | PRN

## 2015-11-17 MED ORDER — CARVEDILOL 25 MG PO TABS
25.0000 mg | ORAL_TABLET | Freq: Two times a day (BID) | ORAL | Status: DC
  Administered 2015-11-18: 25 mg via ORAL
  Filled 2015-11-17 (×3): qty 1

## 2015-11-17 MED ORDER — ENOXAPARIN SODIUM 40 MG/0.4ML ~~LOC~~ SOLN
40.0000 mg | SUBCUTANEOUS | Status: DC
  Administered 2015-11-17: 40 mg via SUBCUTANEOUS
  Filled 2015-11-17: qty 0.4

## 2015-11-17 MED ORDER — COLCHICINE 0.6 MG PO TABS
0.6000 mg | ORAL_TABLET | Freq: Every day | ORAL | Status: DC
  Administered 2015-11-17 – 2015-11-18 (×2): 0.6 mg via ORAL
  Filled 2015-11-17 (×3): qty 1

## 2015-11-17 MED ORDER — VANCOMYCIN HCL IN DEXTROSE 1-5 GM/200ML-% IV SOLN
1000.0000 mg | Freq: Once | INTRAVENOUS | Status: AC
  Administered 2015-11-17: 1000 mg via INTRAVENOUS
  Filled 2015-11-17: qty 200

## 2015-11-17 MED ORDER — FUROSEMIDE 20 MG PO TABS: 20.0000 mg | ORAL_TABLET | Freq: Every day | ORAL | Status: DC

## 2015-11-17 MED ORDER — ADULT MULTIVITAMIN W/MINERALS CH
1.0000 | ORAL_TABLET | Freq: Every day | ORAL | Status: DC
  Administered 2015-11-17 – 2015-11-18 (×2): 1 via ORAL
  Filled 2015-11-17 (×3): qty 1

## 2015-11-17 MED ORDER — MORPHINE SULFATE (PF) 4 MG/ML IV SOLN
INTRAVENOUS | Status: AC
  Administered 2015-11-17: 4 mg
  Filled 2015-11-17: qty 1

## 2015-11-17 MED ORDER — POTASSIUM CHLORIDE CRYS ER 10 MEQ PO TBCR
10.0000 meq | EXTENDED_RELEASE_TABLET | Freq: Two times a day (BID) | ORAL | Status: DC
  Administered 2015-11-17 – 2015-11-18 (×3): 10 meq via ORAL
  Filled 2015-11-17 (×3): qty 1

## 2015-11-17 MED ORDER — ONDANSETRON HCL 4 MG/2ML IJ SOLN
INTRAMUSCULAR | Status: AC
  Filled 2015-11-17: qty 2

## 2015-11-17 MED ORDER — GABAPENTIN 300 MG PO CAPS
300.0000 mg | ORAL_CAPSULE | Freq: Three times a day (TID) | ORAL | Status: DC
  Administered 2015-11-17 – 2015-11-18 (×3): 300 mg via ORAL
  Filled 2015-11-17 (×3): qty 1

## 2015-11-17 MED ORDER — WARFARIN SODIUM 3 MG PO TABS
6.0000 mg | ORAL_TABLET | Freq: Once | ORAL | Status: AC
  Administered 2015-11-17: 6 mg via ORAL
  Filled 2015-11-17: qty 2

## 2015-11-17 MED ORDER — DICLOFENAC SODIUM 1 % TD GEL
2.0000 g | Freq: Four times a day (QID) | TRANSDERMAL | Status: DC
  Filled 2015-11-17: qty 100

## 2015-11-17 MED ORDER — WARFARIN - PHYSICIAN DOSING INPATIENT: Freq: Every day | Status: DC

## 2015-11-17 MED ORDER — AMLODIPINE BESYLATE 10 MG PO TABS: 10.0000 mg | ORAL_TABLET | Freq: Every day | ORAL | Status: DC

## 2015-11-17 MED ORDER — HYDROCODONE-ACETAMINOPHEN 5-325 MG PO TABS
1.0000 | ORAL_TABLET | ORAL | Status: DC | PRN
  Administered 2015-11-17 – 2015-11-18 (×3): 1 via ORAL
  Filled 2015-11-17 (×3): qty 1
  Filled 2015-11-17: qty 2

## 2015-11-17 MED ORDER — TAMSULOSIN HCL 0.4 MG PO CAPS
0.4000 mg | ORAL_CAPSULE | Freq: Every day | ORAL | Status: DC
  Administered 2015-11-17: 0.4 mg via ORAL
  Filled 2015-11-17: qty 1

## 2015-11-17 MED ORDER — ACETAMINOPHEN 650 MG RE SUPP: 650.0000 mg | Freq: Four times a day (QID) | RECTAL | Status: DC | PRN

## 2015-11-17 MED ORDER — MORPHINE SULFATE (PF) 4 MG/ML IV SOLN
4.0000 mg | Freq: Once | INTRAVENOUS | Status: DC
  Filled 2015-11-17: qty 1

## 2015-11-17 MED ORDER — ACETAMINOPHEN 325 MG PO TABS
650.0000 mg | ORAL_TABLET | Freq: Four times a day (QID) | ORAL | Status: DC | PRN
  Administered 2015-11-17: 650 mg via ORAL
  Filled 2015-11-17: qty 2

## 2015-11-17 MED ORDER — FUROSEMIDE 20 MG PO TABS: 20.0000 mg | ORAL_TABLET | ORAL | Status: DC

## 2015-11-17 MED ORDER — ISOSORBIDE MONONITRATE ER 60 MG PO TB24
120.0000 mg | ORAL_TABLET | Freq: Every day | ORAL | Status: DC
  Administered 2015-11-17 – 2015-11-18 (×2): 120 mg via ORAL
  Filled 2015-11-17 (×2): qty 2

## 2015-11-17 MED ORDER — INSULIN ASPART PROT & ASPART (70-30 MIX) 100 UNIT/ML ~~LOC~~ SUSP
30.0000 [IU] | Freq: Every day | SUBCUTANEOUS | Status: DC
  Administered 2015-11-18: 30 [IU] via SUBCUTANEOUS
  Filled 2015-11-17: qty 30

## 2015-11-17 MED ORDER — VITAMIN E 180 MG (400 UNIT) PO CAPS
400.0000 [IU] | ORAL_CAPSULE | Freq: Two times a day (BID) | ORAL | Status: DC
  Administered 2015-11-17 – 2015-11-18 (×2): 400 [IU] via ORAL
  Filled 2015-11-17 (×2): qty 1

## 2015-11-17 MED ORDER — INSULIN ASPART 100 UNIT/ML ~~LOC~~ SOLN
0.0000 [IU] | Freq: Every day | SUBCUTANEOUS | Status: DC
  Filled 2015-11-17: qty 3

## 2015-11-17 NOTE — ED Provider Notes (Signed)
[facility] Emergency Department Provider Note  ____________________________________________  Time seen: Approximately 508 AM  I have reviewed the triage vital signs and the nursing notes.   HISTORY  Chief Complaint Hip Pain    HPI Chase Cabrera is a 70 y.o. male who comes into the hospital today with some left hip pain. The patient had hip replacement 6 days ago. He reports that he was at his nursing facility when he went to the bathroom without assistance. The patient reports that he fell and heard a pop in his left hip. He is unsure if the pop occurred before he fell or after he fell. He reports that he does not know if he fell on his hip but he does not think he hit his head. He did not pass out. He noticed some swelling to his left thigh and is also unsure if that was there prior to the fall or after the fall. Since then his pain has been worse. He rates the pain a 10 out of 10 or greater than a 10 out of 10 in intensity. He reports that it was not too bad after surgery but it come and gone. He has had some cough as well since surgery. His orthopedic surgeon is Dr. Youlanda Mighty who performed his surgery.   Past Medical History  Diagnosis Date  . Myocardial infarction ([place])   . Arthritis   . Diabetes mellitus without complication ([place])   . Low blood potassium   . Hypertension   . CHF (congestive heart failure) ([place])   . Cancer ([place])     skin face  . Hyperlipemia   . Atrial fibrillation ([place])   . Spinal stenosis   . Renal artery stenosis ([place])   . Corneal dystrophy     bilateral  . Hyponatremia   . Urinary retention   . Gout   . Cough, persistent     Patient Active Problem List   Diagnosis Date Noted  . Primary osteoarthritis of left hip 11/11/2015    Past Surgical History  Procedure Laterality Date  . Cardiac surgery    . Gallblader    . Cardiac catheterization    . Coronary angioplasty    . Coronary angioplasty with stent placement   03/28/2015    Distal 80% to normal Stent, Dilation Balloon  . Cardiac electrophysiology study and ablation    . Cardioversion    . Eye surgery      bilateral cataract  . Back surgery      lumbar fusion  . Total hip arthroplasty Left 11/11/2015    Procedure: TOTAL HIP ARTHROPLASTY ANTERIOR APPROACH;  Surgeon: Hessie Knows, MD;  Location: [facility] ORS;  Service: Orthopedics;  Laterality: Left;    Current Outpatient Rx  Name  Route  Sig  Dispense  Refill  . amLODipine (NORVASC) 10 MG tablet   Oral   Take 1 tablet by mouth daily.         Marland Kitchen aspirin EC 81 MG tablet   Oral   Take 1 tablet by mouth daily.         . carvedilol (COREG) 25 MG tablet   Oral   Take 25 mg by mouth 2 (two) times daily with a meal.       1   . clopidogrel (PLAVIX) 75 MG tablet   Oral   Take 75 mg by mouth daily.         . colchicine 0.6 MG tablet   Oral  Take 1 tablet by mouth daily.      0   . diclofenac sodium (VOLTAREN) 1 % GEL   Topical   Apply 2 g topically 4 (four) times daily.         Marland Kitchen dicyclomine (BENTYL) 10 MG capsule   Oral   Take 10 mg by mouth 4 (four) times daily.         . furosemide (LASIX) 20 MG tablet   Oral   Take 20-40 mg by mouth daily. 20 one day and 40 the next day. Alternate daily. Had 30m on 07-02-2015      0   . gabapentin (NEURONTIN) 300 MG capsule   Oral   Take 1 capsule by mouth 3 (three) times daily.         .Marland KitchenHUMALOG MIX 75/25 KWIKPEN (75-25) 100 UNIT/ML Kwikpen   Subcutaneous   Inject 30-44 Units into the skin 2 (two) times daily. 30 units every morning and 44 units in the evening.      0     Dispense as written.   . hydrALAZINE (APRESOLINE) 100 MG tablet   Oral   Take 1 tablet by mouth 3 (three) times daily.         . isosorbide mononitrate (IMDUR) 120 MG 24 hr tablet   Oral   Take 1 tablet by mouth daily.         .Marland KitchenJANUVIA 100 MG tablet   Oral   Take 1 tablet by mouth daily.           Dispense as written.   .Marland KitchenLYRICA 75 MG  capsule   Oral   Take 75 mg by mouth 2 (two) times daily.      0     Dispense as written.   . metFORMIN (GLUCOPHAGE) 1000 MG tablet   Oral   Take 1 tablet by mouth 2 (two) times daily.      0   . Multiple Vitamin (MULTI-VITAMINS) TABS   Oral   Take 1 tablet by mouth daily.         .Marland KitchenEXPIRED: nitroGLYCERIN (NITROSTAT) 0.4 MG SL tablet   Sublingual   Place 1 tablet under the tongue every 5 (five) minutes x 3 doses as needed. For chest pain.  Call 911 if no relief after 3 tablets         . Omega-3 Fatty Acids (FISH OIL) 1000 MG CAPS   Oral   Take 1 capsule by mouth at bedtime.         .Marland Kitchenomeprazole (PRILOSEC) 40 MG capsule   Oral   Take 1 capsule by mouth 2 (two) times daily.         .Marland KitchenoxyCODONE (OXY IR/ROXICODONE) 5 MG immediate release tablet   Oral   Take 1-2 tablets (5-10 mg total) by mouth every 3 (three) hours as needed for breakthrough pain.   30 tablet   0   . potassium chloride (K-DUR,KLOR-CON) 10 MEQ tablet   Oral   Take 10 mEq by mouth 2 (two) times daily.          . pravastatin (PRAVACHOL) 40 MG tablet   Oral   Take 40 mg by mouth daily.      0   . pregabalin (LYRICA) 75 MG capsule   Oral   Take 1 capsule (75 mg total) by mouth 2 (two) times daily.   30 capsule   0   . probenecid (BENEMID) 500 MG tablet  Oral   Take 1 tablet by mouth 2 (two) times daily.      0   . tamsulosin (FLOMAX) 0.4 MG CAPS capsule   Oral   Take 1 capsule by mouth daily.         . vitamin C (ASCORBIC ACID) 500 MG tablet   Oral   Take 1,000 mg by mouth daily.         . Vitamin E 400 units TABS   Oral   Take 400 Units by mouth 2 (two) times daily.         Marland Kitchen warfarin (COUMADIN) 1 MG tablet   Oral   Take 1 mg by mouth daily.         Marland Kitchen warfarin (COUMADIN) 3 MG tablet   Oral   Take 3 mg by mouth one time only at 6 PM. Total of 4 mg daily           Allergies Allopurinol; Atenolol; Atorvastatin; Ramipril; Simvastatin; Valsartan; and  Rosuvastatin  History reviewed. No pertinent family history.  Social History Social History  Substance Use Topics  . Smoking status: Never Smoker   . Smokeless tobacco: Never Used  . Alcohol Use: No    Review of Systems Constitutional: No fever/chills Eyes: No visual changes. ENT: No sore throat. Cardiovascular: Denies chest pain. Respiratory: Cough but Denies shortness of breath. Gastrointestinal: No abdominal pain.  No nausea, no vomiting.  No diarrhea.  No constipation. Genitourinary: Negative for dysuria. Musculoskeletal: Left hip pain  Skin: Negative for rash. Neurological: Negative for headaches, focal weakness or numbness.  10-point ROS otherwise negative.  ____________________________________________   PHYSICAL EXAM:  VITAL SIGNS: ED Triage Vitals  Enc Vitals Group     BP 11/17/15 0413 160/65 mmHg     Pulse Rate 11/17/15 0413 70     Resp 11/17/15 0413 18     Temp 11/17/15 0413 99.3 F (37.4 C)     Temp Source 11/17/15 0413 Oral     SpO2 11/17/15 0413 87 %     Weight 11/17/15 0413 190 lb (86.183 kg)     Height 11/17/15 0413 5' 8"  (1.727 m)     Head Cir --      Peak Flow --      Pain Score 11/17/15 0415 9     Pain Loc --      Pain Edu? --      Excl. in [place]? --     Constitutional: Alert and oriented. Well appearing and in no acute distress. Eyes: Conjunctivae are normal. PERRL. EOMI. Head: Atraumatic. Nose: No congestion/rhinnorhea. Mouth/Throat: Mucous membranes are moist.  Oropharynx non-erythematous. Cardiovascular: Normal rate, regular rhythm. Grossly normal heart sounds.  Good peripheral circulation. Respiratory: Normal respiratory effort.  No retractions. Crackles at bilateral bases Gastrointestinal: Soft and nontender. No distention. Positive bowel sounds Musculoskeletal: left hip with some tenderness to palpation and some mild thigh swelling. Pain with passive range of motion.  Neurologic:  Normal speech and language.  Skin:  Skin is warm, dry  and intact.  Psychiatric: Mood and affect are normal.   ____________________________________________   LABS (all labs ordered are listed, but only abnormal results are displayed)  Labs Reviewed  CBC - Abnormal; Notable for the following:    RBC 3.25 (*)    Hemoglobin 10.4 (*)    HCT 30.3 (*)    RDW 16.1 (*)    All other components within normal limits  COMPREHENSIVE METABOLIC PANEL - Abnormal; Notable for the following:  Sodium 133 (*)    Chloride 98 (*)    Glucose, Bld 202 (*)    Calcium 8.5 (*)    Albumin 3.1 (*)    All other components within normal limits  CULTURE, BLOOD (ROUTINE X 2)  CULTURE, BLOOD (ROUTINE X 2)  TROPONIN I   ____________________________________________  EKG  ED ECG REPORT I, Loney Hering, the attending physician, personally viewed and interpreted this ECG.   Date: 11/17/2015  EKG Time: 550  Rate: 83  Rhythm: normal sinus rhythm, frequent PVC's noted  Axis: normal  Intervals:none  ST&T Change: none  ____________________________________________  RADIOLOGY  Right hip x-ray: Left hip arthroplasty in place without complication, no acute fracture or dislocation  Chest x-ray: Cardiac enlargement with vascular congestion, no edema, superimposed infiltration in the left lung base behind the heart may indicate pneumonia. ____________________________________________   PROCEDURES  Procedure(s) performed: None  Critical Care performed: No  ____________________________________________   INITIAL IMPRESSION / ASSESSMENT AND PLAN / ED COURSE  Pertinent labs & imaging results that were available during my care of the patient were reviewed by me and considered in my medical decision making (see chart for details).  This is a 70 year old male who comes into the hospital today with some right hip pain after a fall. The patient was hypoxic so we did add on a chest x-ray and do some blood work. The patient's blood work is unremarkable and  does not appear as though his hip is broken but he does still have some pain. Along with that the patient appears to have a pneumonia which is postoperative. I will do some blood cultures and give the patient some vancomycin and levofloxacin and I will admit the patient to the hospitalist service. The patient did receive 2 doses of morphine while in the emergency department. ____________________________________________   FINAL CLINICAL IMPRESSION(S) / ED DIAGNOSES  Final diagnoses:  Healthcare-associated pneumonia  Hip pain, left      Loney Hering, MD 11/17/15 [phone]

## 2015-11-17 NOTE — ED Notes (Addendum)
Assisted pt with using the urinal; Dr Dahlia Client in to see pt; rates pain 10/10

## 2015-11-17 NOTE — Clinical Social Work Note (Signed)
Clinical Social Work Assessment  Patient Details  Name: Chase Cabrera MRN: [id number] Date of Birth: [birth year]  Date of referral:  11/17/15               Reason for consult:  Facility Placement, Other (Comment Required) (From [employer] )                Permission sought to share information with:  [name] granted to share information::  Yes, Verbal Permission Granted  Name::      Chase Cabrera::   Friendship   Relationship::     Contact Information:     Housing/Transportation Living arrangements for the past 2 months:  [place], [place] of Information:  Patient, Spouse Patient Interpreter Needed:  None Criminal Activity/Legal Involvement Pertinent to Current Situation/Hospitalization:  No - Comment as needed Significant Relationships:  Adult Children, Spouse Lives with:  Spouse Do you feel safe going back to the place where you live?  Yes Need for family participation in patient care:  Yes (Comment)  Care giving concerns:  Patient came to [facility] from [facility], where he is at for STR.    Social Worker assessment / plan:  Holiday representative ([place]) received verbal consult from RN that patient is from [facility]. CSW is familiar with patient and wife because he was recently discharged from [facility] to [facility]. Patient is a readmit. Per [facility] admissions coordinator at [facility] patient can return when he is medically cleared. CSW met with patient and his pastor was at bedside. Patient remembered CSW from last admission. Per patient he fell at [facility] because he was not following the rules. Per patient it was not [facility] fault that he fell. Patient wants to return to [facility]. Patient gave CSW permission to call his wife.   CSW contacted patient's wife Chase Cabrera. Per wife she would like for patient to return to [facility]. Wife inquired  about the Medicare rule for 3 night inpatient stay. CSW explained to wife that patient does not need another 3 night inpatient stay because it is within 30 days from his last 3 night qualifying inpatient stay at [facility]. Wife verbalized her understanding.   FL2 complete and faxed out. CSW will continue to follow and assist as needed.   Employment status:  Retired Forensic scientist:  Medicare PT Recommendations:  Not assessed at this time Information / Referral to community resources:  [place]  Patient/Family's Response to care:  Patient and wife prefer for patient to return to [facility].   Patient/Family's Understanding of and Emotional Response to Diagnosis, Current Treatment, and Prognosis:  Patient was pleasant throughout assessment and thanked CSW for visit.   Emotional Assessment Appearance:  Appears stated age Attitude/Demeanor/Rapport:    Affect (typically observed):  Accepting, Adaptable, Pleasant Orientation:  Oriented to Self, Oriented to Place, Oriented to  Time, Oriented to Situation Alcohol / Substance use:  Not Applicable Psych involvement (Current and /or in the community):  No (Comment)  Discharge Needs  Concerns to be addressed:  Discharge Planning Concerns Readmission within the last 30 days:  Yes Current discharge risk:  Dependent with Mobility Barriers to Discharge:  Continued Medical Work up   Loralyn Freshwater, LCSW 11/17/2015, 2:26 PM

## 2015-11-17 NOTE — ED Notes (Signed)
Pt's oxygen 87% on room air; placed on 2L via [place];

## 2015-11-17 NOTE — H&P (Signed)
[facility] NAME: Chase Cabrera    MR#:  [id number]  DATE OF BIRTH:  April 16, 1946  DATE OF ADMISSION:  11/17/2015  PRIMARY CARE PHYSICIAN: Kirk Ruths., MD   REQUESTING/REFERRING PHYSICIAN: Dr. Dahlia Client  CHIEF COMPLAINT:   Chief Complaint  Patient presents with  . Hip Pain    HISTORY OF PRESENT ILLNESS:  Chase Cabrera  is a 70 y.o. male with a known history of coronary artery disease status post stents, diabetes, hypertension, history of CHF, hx of a. Fib,  osteoarthritis, gout, history of urinary retention, hyperlipidemia, history of renal artery stenosis status post stent who presents to the hospital after a fall noted to have left hip pain and also noted to be hypoxic. Had recent left hip surgery and is postop day #4 today and had a fall at the skilled nursing facility and was complaining of left hip pain and sent to the ER for further evaluation. In the emergency room patient was noted to be hypoxic with room air O2 sats in the 80s. Patient also had a low-grade fever prior to being discharged. His chest x-ray findings suggestive of possible left-sided pneumonia and therefore hospice services were contacted further treatment and evaluation. Patient admits to a cough which is productive. He denies any nausea vomiting abdominal pain or any other associated symptoms presently.  PAST MEDICAL HISTORY:   Past Medical History  Diagnosis Date  . Myocardial infarction ([place])   . Arthritis   . Diabetes mellitus without complication ([place])   . Low blood potassium   . Hypertension   . CHF (congestive heart failure) ([place])   . Cancer ([place])     skin face  . Hyperlipemia   . Atrial fibrillation ([place])   . Spinal stenosis   . Renal artery stenosis ([place])   . Corneal dystrophy     bilateral  . Hyponatremia   . Urinary retention   . Gout   . Cough, persistent     PAST SURGICAL HISTORY:   Past Surgical History  Procedure  Laterality Date  . Cardiac surgery    . Gallblader    . Cardiac catheterization    . Coronary angioplasty    . Coronary angioplasty with stent placement  03/28/2015    Distal 80% to normal Stent, Dilation Balloon  . Cardiac electrophysiology study and ablation    . Cardioversion    . Eye surgery      bilateral cataract  . Back surgery      lumbar fusion  . Total hip arthroplasty Left 11/11/2015    Procedure: TOTAL HIP ARTHROPLASTY ANTERIOR APPROACH;  Surgeon: Hessie Knows, MD;  Location: [facility] ORS;  Service: Orthopedics;  Laterality: Left;    SOCIAL HISTORY:   Social History  Substance Use Topics  . Smoking status: Former Research scientist (life sciences)  . Smokeless tobacco: Never Used  . Alcohol Use: No    FAMILY HISTORY:   Family History  Problem Relation Age of Onset  . CVA Father   . Lung cancer Mother   . Stomach cancer Sister     DRUG ALLERGIES:   Allergies  Allergen Reactions  . Allopurinol Diarrhea, Other (See Comments) and Nausea And Vomiting    Other Reaction: GI Upset  . Atenolol Other (See Comments)    Other Reaction: bradycardia  . Atorvastatin Other (See Comments)    Other Reaction: muscle aches  . Ramipril Rash  . Simvastatin Other (See Comments)    Other Reaction: MUSCLE  ACHES (Zocor)  . Valsartan Other (See Comments)    Other Reaction: facial swelling  . Rosuvastatin Other (See Comments)    Muscle aches    REVIEW OF SYSTEMS:   Review of Systems  Constitutional: Negative for fever and chills.  HENT: Negative for congestion and tinnitus.   Eyes: Negative for blurred vision and double vision.  Respiratory: Negative for cough, shortness of breath and wheezing.   Cardiovascular: Negative for chest pain, orthopnea and PND.  Gastrointestinal: Negative for nausea, vomiting, abdominal pain and diarrhea.  Genitourinary: Negative for dysuria and hematuria.  Musculoskeletal: Positive for joint pain (left hip) and falls.  Neurological: Positive for weakness. Negative for  dizziness, sensory change and focal weakness.  All other systems reviewed and are negative.   MEDICATIONS AT HOME:   Prior to Admission medications   Medication Sig Start Date End Date Taking? Authorizing Provider  amLODipine (NORVASC) 10 MG tablet Take 1 tablet by mouth daily. 01/03/15  Yes Historical Provider, MD  aspirin EC 81 MG tablet Take 1 tablet by mouth daily. 12/25/10  Yes Historical Provider, MD  carvedilol (COREG) 25 MG tablet Take 25 mg by mouth 2 (two) times daily with a meal.  12/17/14  Yes Historical Provider, MD  clopidogrel (PLAVIX) 75 MG tablet Take 75 mg by mouth daily.   Yes Historical Provider, MD  colchicine 0.6 MG tablet Take 1 tablet by mouth daily. 12/10/14  Yes Historical Provider, MD  diclofenac sodium (VOLTAREN) 1 % GEL Apply 2 g topically 4 (four) times daily.   Yes Historical Provider, MD  dicyclomine (BENTYL) 10 MG capsule Take 10 mg by mouth 4 (four) times daily.   Yes Historical Provider, MD  furosemide (LASIX) 20 MG tablet Take 20-40 mg by mouth daily. 20 one day and 40 the next day. Alternate daily. Had 38m on 07-02-2015 12/30/14  Yes Historical Provider, MD  gabapentin (NEURONTIN) 300 MG capsule Take 1 capsule by mouth 3 (three) times daily. 01/03/15  Yes Historical Provider, MD  HUMALOG MIX 75/25 KWIKPEN (75-25) 100 UNIT/ML Kwikpen Inject 30-44 Units into the skin 2 (two) times daily. 30 units every morning and 44 units in the evening. 10/17/14  Yes Historical Provider, MD  hydrALAZINE (APRESOLINE) 100 MG tablet Take 1 tablet by mouth 3 (three) times daily. 01/03/15  Yes Historical Provider, MD  isosorbide mononitrate (IMDUR) 120 MG 24 hr tablet Take 1 tablet by mouth daily. 01/03/15  Yes Historical Provider, MD  JANUVIA 100 MG tablet Take 1 tablet by mouth daily. 01/03/15  Yes Historical Provider, MD  LYRICA 75 MG capsule Take 75 mg by mouth 2 (two) times daily. 01/03/15  Yes Historical Provider, MD  metFORMIN (GLUCOPHAGE) 1000 MG tablet Take 1 tablet by mouth 2 (two)  times daily. 01/01/15  Yes Historical Provider, MD  Multiple Vitamin (MULTI-VITAMINS) TABS Take 1 tablet by mouth daily. 04/15/09  Yes Historical Provider, MD  nitroGLYCERIN (NITROSTAT) 0.4 MG SL tablet Place 0.4 mg under the tongue every 5 (five) minutes as needed for chest pain.   Yes Historical Provider, MD  Omega-3 Fatty Acids (FISH OIL) 1000 MG CAPS Take 1 capsule by mouth at bedtime.   Yes Historical Provider, MD  omeprazole (PRILOSEC) 40 MG capsule Take 1 capsule by mouth 2 (two) times daily. 01/03/15  Yes Historical Provider, MD  oxyCODONE (OXY IR/ROXICODONE) 5 MG immediate release tablet Take 1-2 tablets (5-10 mg total) by mouth every 3 (three) hours as needed for breakthrough pain. 11/13/15  Yes TDuanne Guess PA-C  potassium chloride (K-DUR,KLOR-CON) 10 MEQ tablet Take 10 mEq by mouth 2 (two) times daily.  01/03/15  Yes Historical Provider, MD  pravastatin (PRAVACHOL) 40 MG tablet Take 40 mg by mouth daily. 12/02/14  Yes Historical Provider, MD  probenecid (BENEMID) 500 MG tablet Take 1 tablet by mouth 2 (two) times daily. 01/09/15  Yes Historical Provider, MD  tamsulosin (FLOMAX) 0.4 MG CAPS capsule Take 1 capsule by mouth daily. 01/03/15  Yes Historical Provider, MD  vitamin C (ASCORBIC ACID) 500 MG tablet Take 1,000 mg by mouth daily.   Yes Historical Provider, MD  Vitamin E 400 units TABS Take 400 Units by mouth 2 (two) times daily.   Yes Historical Provider, MD  diclofenac sodium (VOLTAREN) 1 % GEL Apply 2 g topically 4 (four) times daily.    Historical Provider, MD  nitroGLYCERIN (NITROSTAT) 0.4 MG SL tablet Place 1 tablet under the tongue every 5 (five) minutes x 3 doses as needed. For chest pain.  Call 911 if no relief after 3 tablets 09/28/11 10/28/15  Historical Provider, MD  pregabalin (LYRICA) 75 MG capsule Take 1 capsule (75 mg total) by mouth 2 (two) times daily. 11/13/15   Duanne Guess, PA-C      VITAL SIGNS:  Blood pressure 144/60, pulse 67, temperature 99.3 F (37.4 C),  temperature source Oral, resp. rate 28, height 5' 8"  (1.727 m), weight 86.183 kg (190 lb), SpO2 95 %.  PHYSICAL EXAMINATION:  Physical Exam  GENERAL:  70 y.o.-year-old patient lying in the bed in no acute distress.  EYES: Pupils equal, round, reactive to light and accommodation. No scleral icterus. Extraocular muscles intact.  HEENT: Head atraumatic, normocephalic. Oropharynx and nasopharynx clear. No oropharyngeal erythema, moist oral mucosa  NECK:  Supple, no jugular venous distention. No thyroid enlargement, no tenderness.  LUNGS: Normal breath sounds bilaterally, no wheezing, rales, rhonchi. No use of accessory muscles of respiration.  CARDIOVASCULAR: S1, S2 Irregular. II/VI SEM at base, No rubs, gallops, clicks.  ABDOMEN: Soft, nontender, nondistended. Bowel sounds present. No organomegaly or mass.  EXTREMITIES: No pedal edema, cyanosis, or clubbing. + 2 pedal & radial pulses b/l.   NEUROLOGIC: Cranial nerves II through XII are intact. No focal Motor or sensory deficits appreciated b/l PSYCHIATRIC: The patient is alert and oriented x 3.  SKIN: No obvious rash, lesion, or ulcer.   LABORATORY PANEL:   CBC  Recent Labs Lab 11/17/15 0425  WBC 8.7  HGB 10.4*  HCT 30.3*  PLT 215   ------------------------------------------------------------------------------------------------------------------  Chemistries   Recent Labs Lab 11/17/15 0425  NA 133*  K 3.8  CL 98*  CO2 24  GLUCOSE 202*  BUN 17  CREATININE 0.98  CALCIUM 8.5*  AST 41  ALT 42  ALKPHOS 90  BILITOT 1.0   ------------------------------------------------------------------------------------------------------------------  Cardiac Enzymes  Recent Labs Lab 11/17/15 0425  TROPONINI <0.03   ------------------------------------------------------------------------------------------------------------------  RADIOLOGY:  Dg Chest 1 View  11/17/2015  CLINICAL DATA:  Sinus drainage and productive cough.  On  oxygen. EXAM: CHEST 1 VIEW COMPARISON:  07/03/2015 FINDINGS: Postoperative changes in the mediastinum. Cardiac enlargement with mild vascular congestion. No significant edema. Superimposed infiltration demonstrated in the left lung base behind the heart may represent superimposed pneumonia. Calcified granulomas in the lungs. No blunting of costophrenic angles. No pneumothorax. IMPRESSION: Cardiac enlargement with vascular congestion. No edema. Superimposed infiltration in the left lung base behind the heart may indicate pneumonia. Electronically Signed   By: Lucienne Capers M.D.   On: 11/17/2015 05:19  Dg Hip Unilat With Pelvis 2-3 Views Left  11/17/2015  CLINICAL DATA:  Initial evaluation for acute hip pain. EXAM: DG HIP (WITH OR WITHOUT PELVIS) 2-3V LEFT COMPARISON:  Prior study from 11/11/2015. FINDINGS: Skin staples remain in place. Left total hip arthroplasty in place and appears to be appropriately positioned. The acetabular and femoral components articulate normally with 1 another. No periprosthetic lucency to suggest loosening or infection. No fracture or dislocation. Remainder of the pelvis is intact. No acute abnormality about the right hip. Spinal fusion hardware present within the lower lumbar spine. Osteopenia noted. No acute soft tissue abnormality. IMPRESSION: Left hip arthroplasty in place without complication. No acute fracture or dislocation. Electronically Signed   By: Jeannine Boga M.D.   On: 11/17/2015 05:28     IMPRESSION AND PLAN:   70 year old male with past medical history of coronary artery disease status post stents, osteoarthritis, history of chronic afibrillation, history of CHF, gout, urinary retention, diabetes, diabetic neuropathy who presents to the hospital after a fall and noted to have some left hip pain and also noted to be hypoxic.  #1 acute respiratory failure with hypoxia-this is suspected to be secondary to pneumonia. -I will start patient on IV  vancomycin, Zosyn. Continue oxygen supplementation and weaning as tolerated.  #2 pneumonia-IV vancomycin, Zosyn. -Follow blood, sputum cultures. Patient afebrile, normal white cell count.  #3 type 2 diabetes without complication-continue now on 7030 and also sliding scale insulin. -Carb-controlled diet, follow blood sugars.  #4 diabetic neuropathy-continue gabapentin, Lyrica.  #5 history of BPH-continue Flomax.  #6 left hip pain-status post recent left hip hemiarthroplasty. X-ray showing no evidence of acute fracture. -Consult orthopedics. PT eval  #7 history of chronic atrial fibrillation-rate controlled. Continue Coreg. -Continue Coumadin. Follow INR.  #8 history of gout-continue colchicine, probenecid. No acute attack.  #9 history of hypertension-continue Norvasc, Coreg, Imdur, hydralazine.  #10 hyperlipidemia-continue Pravachol.  #11 GERD-continue Protonix.   All the records are reviewed and case discussed with ED provider. Management plans discussed with the patient, family and they are in agreement.  CODE STATUS: Full  TOTAL TIME TAKING CARE OF THIS PATIENT: 50 minutes.    Henreitta Leber M.D on 11/17/2015 at 8:35 AM  Between 7am to 6pm - Pager - [phone]  After 6pm go to [email or web address] - password [email or web address] [facility]  Office  [phone]  CC: Primary care physician; Kirk Ruths., MD

## 2015-11-17 NOTE — Care Management Note (Signed)
Case Management Note  Patient Details  Name: Chase Cabrera MRN: [id number] Date of Birth: [birth year]  Subjective/Objective:    70yo Chase Chase Cabrera was admitted 11/17/15 after a fall at [facility] and was diagnosed with pneumonia and left hip pain in the [facility]ED. (Chase Chase Cabrera received a left THA on 11/11/15 by Dr Rudene Christians and was discharged to [facility] on 11/14/15). Chase Chase Cabrera is on chronic coumadin per A-Fib. PCP=Dr Frazier Richards. Pharmacy=[facility] in [place]. Already has a rolling walker at home. Resides at home with his wife. Chase Chase Cabrera verbalized a desire to return to [facility] after this hospital discharge. Case management will follow for discharge planning if Chase Cabrera is discharged home. Otherwise, [facility] will follow for return to [facility].                  Action/Plan:   Expected Discharge Date:  11/20/15               Expected Discharge Plan:     In-House Referral:     Discharge planning Services     Post Acute Care Choice:    Choice offered to:     DME Arranged:    DME Agency:     HH Arranged:    HH Agency:     Status of Service:     Medicare Important Message Given:    Date Medicare IM Given:    Medicare IM give by:    Date Additional Medicare IM Given:    Additional Medicare Important Message give by:     If discussed at [place] of Stay Meetings, dates discussed:    Additional Comments:  Chondra Boyde A, RN 11/17/2015, 2:07 PM

## 2015-11-17 NOTE — Consult Note (Signed)
Pharmacy Antibiotic Note  Chase Cabrera is a 70 y.o. male admitted on 11/17/2015 with pneumonia.  Pharmacy has been consulted for vancomycin and zosyn dosing.  Plan: Vancomycin 1000 mg IV every 12 hours.  Goal trough 15-20 mcg/mL. Zosyn 3.375g IV q8h (4 hour infusion). Will give next dose of vancomycin 6 hours after initial dose in ED for stacked dosing. Trough at steady state 3/8 @ 0300.  Height: 5' 8"  (172.7 cm) Weight: 190 lb (86.183 kg) IBW/kg (Calculated) : 68.4  Temp (24hrs), Avg:99.3 F (37.4 C), Min:99.2 F (37.3 C), Max:99.3 F (37.4 C)   Recent Labs Lab 11/11/15 1252 11/12/15 0448 11/13/15 0513 11/17/15 0425  WBC 10.8* 9.9 9.1 8.7  CREATININE  --   --  0.98 0.98    Estimated Creatinine Clearance: 73.8 mL/min (by C-G formula based on Cr of 0.98).    Allergies  Allergen Reactions  . Allopurinol Diarrhea, Other (See Comments) and Nausea And Vomiting    Other Reaction: GI Upset  . Atenolol Other (See Comments)    Other Reaction: bradycardia  . Atorvastatin Other (See Comments)    Other Reaction: muscle aches  . Ramipril Rash  . Simvastatin Other (See Comments)    Other Reaction: MUSCLE ACHES (Zocor)  . Valsartan Other (See Comments)    Other Reaction: facial swelling  . Rosuvastatin Other (See Comments)    Muscle aches    Antimicrobials this admission: levofloxacin 3/6 >> 3/6 one dose vancomycin 3/6 >>  Zosyn 3/6>>  Dose adjustments this admission:   Microbiology results: 3/6 BCx: pending   Thank you for allowing pharmacy to be a part of this patient's care.  Ramond Dial 11/17/2015 11:03 AM

## 2015-11-17 NOTE — ED Notes (Signed)
Wife and grandson have arrived and are waiting in room for pt to return from xray

## 2015-11-17 NOTE — ED Notes (Signed)
Chase Cabrera from [facility] called to make sure fax was received; she said pt had a temp earlier on Sunday of 100.1; will notify MD of her report

## 2015-11-17 NOTE — Progress Notes (Signed)
ANTICOAGULATION CONSULT NOTE - Initial Consult  Pharmacy Consult for warfarin Indication: VTE prophylaxis  Allergies  Allergen Reactions  . Allopurinol Diarrhea, Other (See Comments) and Nausea And Vomiting    Other Reaction: GI Upset  . Atenolol Other (See Comments)    Other Reaction: bradycardia  . Atorvastatin Other (See Comments)    Other Reaction: muscle aches  . Ramipril Rash  . Simvastatin Other (See Comments)    Other Reaction: MUSCLE ACHES (Zocor)  . Valsartan Other (See Comments)    Other Reaction: facial swelling  . Rosuvastatin Other (See Comments)    Muscle aches  . Cardizem [Diltiazem] Rash    Patient Measurements: Height: 5' 8"  (172.7 cm) Weight: 190 lb (86.183 kg) IBW/kg (Calculated) : 68.4  Vital Signs: Temp: 99.2 F (37.3 C) (03/06 1029) Temp Source: Oral (03/06 1029) BP: 119/80 mmHg (03/06 1029) Pulse Rate: 58 (03/06 1029)  Labs:  Recent Labs  11/17/15 0425  HGB 10.4*  HCT 30.3*  PLT 215  LABPROT 16.3*  INR 1.30  CREATININE 0.98  TROPONINI <0.03    Estimated Creatinine Clearance: 73.8 mL/min (by C-G formula based on Cr of 0.98).  Assessment: Pharmacy consulted to dose warfarin in this 70 year old male who is post-op from total hip arthroplasty today. Patient was taking warfarin prior to admission for atrial fibrillation with a home dose of 4 mg daily. Patient held warfarin since 2/22.Resumed 2/28. Patient has gotten 6 doses. INR on admission is 1.3.    Goal of Therapy:  INR 2-3 Monitor platelets by anticoagulation protocol: Yes   Plan:  Will boost pt with 1.5 times home dose (52m) tonight. Follow up INR in the AM  MRamond Dial PharmD Clinical Pharmacist 11/17/2015,3:28 PM

## 2015-11-17 NOTE — ED Notes (Signed)
Patient transported back from xray

## 2015-11-17 NOTE — ED Notes (Signed)
Patient transported to X-ray 

## 2015-11-17 NOTE — ED Notes (Signed)
Resumed care from laur[facility]. Pt reports that his pain is unchanged or only slightly improved since arrival. Pt resting quietly. Gave pt information on deep breathing since it has been less than an hour since his last medication dose.

## 2015-11-17 NOTE — NC FL2 (Signed)
[place] LEVEL OF CARE SCREENING TOOL     IDENTIFICATION  Patient Name: Chase Cabrera Birthdate: Mar 07, 1946 Sex: male Admission Date (Current Location): 11/17/2015  [place] and [place] Number:  Engineering geologist and Address:  [facility], [address] [place], [place] [address]      Provider Number: [id number]  Attending Physician Name and Address:  Henreitta Leber, MD  Relative Name and Phone Number:       Current Level of Care: Hospital Recommended Level of Care: [place] Prior Approval Number:    Date Approved/Denied:   PASRR Number:  ( [id number] )  Discharge Plan: SNF    Current Diagnoses: Patient Active Problem List   Diagnosis Date Noted  . Pneumonia 11/17/2015  . Primary osteoarthritis of left hip 11/11/2015   Myocardial infarction ([place])    . Arthritis   . Diabetes mellitus without complication ([place])   . Low blood potassium   . Hypertension   . CHF (congestive heart failure) ([place])   . Cancer ([place])     skin face  . Hyperlipemia   . Atrial fibrillation ([place])   . Spinal stenosis   . Renal artery stenosis ([place])   . Corneal dystrophy     bilateral  . Hyponatremia   . Urinary retention   . Gout   . Cough, persistent          Orientation RESPIRATION BLADDER Height & Weight     Self, Time, Situation, Place  O2 (2 Liters Oxygen ) Continent Weight: 190 lb (86.183 kg) Height:  5' 8"  (172.7 cm)  BEHAVIORAL SYMPTOMS/MOOD NEUROLOGICAL BOWEL NUTRITION STATUS   (none )  (none ) Continent Diet (Diet: Heart Healthy/ Carb Modified )  AMBULATORY STATUS COMMUNICATION OF NEEDS Skin   Extensive Assist Verbally Surgical wounds (11/17/15: Left Hip Incision. )                       Personal Care Assistance Level of Assistance  Bathing, Feeding, Dressing Bathing Assistance: Limited assistance Feeding assistance: Independent Dressing Assistance:  Limited assistance     Functional Limitations Info    Sight Info: Impaired Hearing Info: Impaired Speech Info: Adequate    SPECIAL CARE FACTORS FREQUENCY  PT (By licensed PT), OT (By licensed OT)     PT Frequency:  (5) OT Frequency:  (5)            Contractures      Additional Factors Info  Code Status, Allergies, Insulin Sliding Scale Code Status Info:  (Full Code. ) Allergies Info:  (Allopurinol, Atenolol, Atorvastatin, Ramipril, Simvastatin, Valsartan, Rosuvastatin, Cardizem)   Insulin Sliding Scale Info:  (NovoLog Insulin Injections 3 times daily )       Current Medications (11/17/2015):  This is the current hospital active medication list Current Facility-Administered Medications  Medication Dose Route Frequency Provider Last Rate Last Dose  . acetaminophen (TYLENOL) tablet 650 mg  650 mg Oral Q6H PRN Henreitta Leber, MD       Or  . acetaminophen (TYLENOL) suppository 650 mg  650 mg Rectal Q6H PRN Henreitta Leber, MD      . amLODipine (NORVASC) tablet 10 mg  10 mg Oral Daily Henreitta Leber, MD   Stopped at 11/17/15 1214  . aspirin EC tablet 81 mg  81 mg Oral Daily Henreitta Leber, MD   81 mg at 11/17/15 1416  . carvedilol (COREG) tablet 25 mg  25 mg  Oral BID WC Henreitta Leber, MD      . clopidogrel (PLAVIX) tablet 75 mg  75 mg Oral Daily Henreitta Leber, MD   75 mg at 11/17/15 1414  . colchicine tablet 0.6 mg  0.6 mg Oral Daily Henreitta Leber, MD   0.6 mg at 11/17/15 1414  . dicyclomine (BENTYL) capsule 10 mg  10 mg Oral QID Henreitta Leber, MD   10 mg at 11/17/15 1415  . furosemide (LASIX) tablet 20 mg  20 mg Oral Daily Henreitta Leber, MD   20 mg at 11/17/15 1414  . gabapentin (NEURONTIN) capsule 300 mg  300 mg Oral TID Henreitta Leber, MD   300 mg at 11/17/15 1413  . hydrALAZINE (APRESOLINE) tablet 100 mg  100 mg Oral TID Henreitta Leber, MD   Stopped at 11/17/15 1352  . HYDROcodone-acetaminophen (NORCO/VICODIN) 5-325 MG per tablet 1-2 tablet  1-2 tablet Oral  Q4H PRN Henreitta Leber, MD   1 tablet at 11/17/15 1414  . insulin aspart (novoLOG) injection 0-15 Units  0-15 Units Subcutaneous TID WC Henreitta Leber, MD   5 Units at 11/17/15 1230  . insulin aspart (novoLOG) injection 0-5 Units  0-5 Units Subcutaneous QHS Henreitta Leber, MD      . Derrill Memo ON 11/18/2015] insulin aspart protamine- aspart (NOVOLOG MIX 70/30) injection 30 Units  30 Units Subcutaneous Q breakfast Henreitta Leber, MD      . insulin aspart protamine- aspart (NOVOLOG MIX 70/30) injection 44 Units  44 Units Subcutaneous Q supper Henreitta Leber, MD      . isosorbide mononitrate (IMDUR) 24 hr tablet 120 mg  120 mg Oral Daily Henreitta Leber, MD   120 mg at 11/17/15 1415  . morphine 2 MG/ML injection 2 mg  2 mg Intravenous Q4H PRN Henreitta Leber, MD      . morphine 4 MG/ML injection 4 mg  4 mg Intravenous Once Loney Hering, MD   4 mg at 11/17/15 0439  . multivitamin with minerals tablet 1 tablet  1 tablet Oral Daily Henreitta Leber, MD   1 tablet at 11/17/15 1415  . nitroGLYCERIN (NITROSTAT) SL tablet 0.4 mg  0.4 mg Sublingual Q5 min PRN Henreitta Leber, MD      . omega-3 acid ethyl esters (LOVAZA) capsule 1 g  1 g Oral Daily Henreitta Leber, MD   1 g at 11/17/15 1413  . ondansetron (ZOFRAN) 4 MG/2ML injection           . ondansetron (ZOFRAN) tablet 4 mg  4 mg Oral Q6H PRN Henreitta Leber, MD       Or  . ondansetron (ZOFRAN) injection 4 mg  4 mg Intravenous Q6H PRN Henreitta Leber, MD      . pantoprazole (PROTONIX) EC tablet 40 mg  40 mg Oral Daily Henreitta Leber, MD   40 mg at 11/17/15 1413  . piperacillin-tazobactam (ZOSYN) IVPB 3.375 g  3.375 g Intravenous 3 times per day Henreitta Leber, MD      . potassium chloride (K-DUR,KLOR-CON) CR tablet 10 mEq  10 mEq Oral BID Henreitta Leber, MD   10 mEq at 11/17/15 1415  . pravastatin (PRAVACHOL) tablet 40 mg  40 mg Oral Daily Henreitta Leber, MD      . pregabalin (LYRICA) capsule 75 mg  75 mg Oral BID Henreitta Leber, MD   75 mg at  11/17/15 1414  .  probenecid (BENEMID) tablet 500 mg  500 mg Oral BID Henreitta Leber, MD   500 mg at 11/17/15 1100  . tamsulosin (FLOMAX) capsule 0.4 mg  0.4 mg Oral Daily Henreitta Leber, MD      . vancomycin (VANCOCIN) IVPB 1000 mg/200 mL premix  1,000 mg Intravenous Q12H Henreitta Leber, MD      . vitamin C (ASCORBIC ACID) tablet 1,000 mg  1,000 mg Oral Daily Henreitta Leber, MD   1,000 mg at 11/17/15 1415  . vitamin E capsule 400 Units  400 Units Oral BID Henreitta Leber, MD      . warfarin (COUMADIN) tablet 4 mg  4 mg Oral q1800 Henreitta Leber, MD      . Warfarin - Physician Dosing Inpatient   Does not apply [id number] Henreitta Leber, MD         Discharge Medications: Please see discharge summary for a list of discharge medications.  Relevant Imaging Results:  Relevant Lab Results:   Additional Information  (SSN: [id number])  Loralyn Freshwater, LCSW

## 2015-11-17 NOTE — Progress Notes (Signed)
Pt with blood pressure of 111/59 and pt has hydralazine ordered. MD order to hold for tonight.

## 2015-11-17 NOTE — ED Notes (Signed)
Pt arrived via EMS from [facility] where he has been since he had left hip surgery this past Tuesday; pt says he was supposed to be there for short period of time for rehab; pt says he got up to go to the restroom; isn't sure if he felt a pop and then fell or if the pop happened after the fall; "it all happened so fast"; pt arrives awake and alert; talking in complete coherent sentences; denies any other injuries;

## 2015-11-17 NOTE — ED Notes (Signed)
Spoke with Lorriane Shire at [facility]; she had already called to give a report-called her back to have her fax over list of pt's medications;

## 2015-11-17 NOTE — ED Notes (Signed)
Nurse Stanton Kidney in room with patient at his time.

## 2015-11-17 NOTE — Evaluation (Signed)
Physical Therapy Evaluation Patient Details Name: Chase Cabrera MRN: [id number] DOB: Jan 23, 1946 Today's Date: 11/17/2015   History of Present Illness  Patient is a 70 y/o male that presents after fall at rehab facility, noted to be hypoxic as well. Patient recently had L THR.   Clinical Impression  Patient recently discharged to SNF after L THR. While at SNF he sustained a fall, noted to by hypoxic. He appears rather self limiting at this time as he was able to bear weight on LLE, however he declined any further attempt at mobility after 2 steps secondary to increased L thigh pain. No bruising noted, and mild tenderness to palpation over L thigh. L THR wound appears intact without visible signs of infection. Prior to this fall patient had been rather limited with his mobility, and displays decreased bed mobility due to poor command following and trunkal weakness. Patient would benefit from additional time at short term rehabilitation after discharge to facilitate safe return home.     Follow Up Recommendations SNF    Equipment Recommendations       Recommendations for Other Services       Precautions / Restrictions Precautions Precautions: Fall;Anterior Hip Precaution Booklet Issued: Yes (comment) Restrictions Weight Bearing Restrictions: Yes LLE Weight Bearing: Weight bearing as tolerated      Mobility  Bed Mobility Overal bed mobility: Needs Assistance Bed Mobility: Supine to Sit     Supine to sit: Min assist     General bed mobility comments: Patient requires cuign for technique, use of handrails, movement of LEs to the edge of the bed, and assistance with elevation of trunk off bed surface.   Transfers Overall transfer level: Needs assistance Equipment used: Rolling walker (2 wheeled) Transfers: Sit to/from Stand Sit to Stand: Min assist         General transfer comment: Patient able to slowly perform transfer, though he complains of pain shortly afterwards.  Heavy reliance on UEs through transfer.   Ambulation/Gait Ambulation/Gait assistance: Min guard;Min assist Ambulation Distance (Feet): 3 Feet Assistive device: Rolling walker (2 wheeled) Gait Pattern/deviations: Step-to pattern;Trunk flexed   Gait velocity interpretation: Below normal speed for age/gender General Gait Details: Patient took roughly 2 steps then reports he needs to sit down secondary to L thigh pain. Unable to progress distance as recliner brought forward to receive patient.   Stairs            Wheelchair Mobility    Modified Rankin (Stroke Patients Only)       Balance Overall balance assessment: Needs assistance Sitting-balance support: No upper extremity supported Sitting balance-Leahy Scale: Good     Standing balance support: Bilateral upper extremity supported Standing balance-Leahy Scale: Fair                               Pertinent Vitals/Pain Pain Assessment: Faces Faces Pain Scale: Hurts even more Pain Location: L thigh Pain Descriptors / Indicators: Aching Pain Intervention(s): Limited activity within patient's tolerance;Monitored during session;Repositioned;Patient requesting pain meds-RN notified    Home Living Family/patient expects to be discharged to:: Skilled nursing facility                      Prior Function Level of Independence: Needs assistance   Gait / Transfers Assistance Needed: Patient reports he has been ambulating limited distances at rehab with RW.      Comments: uses Transport planner for majority of mobility,  however was able to walk about 30-40' with rw at baseline -- prior to THA.      Hand Dominance        Extremity/Trunk Assessment   Upper Extremity Assessment: Overall WFL for tasks assessed           Lower Extremity Assessment: LLE deficits/detail   LLE Deficits / Details: Able to complete SLRs, LAQs, however difficulty/pain with WBing with minimal knee flexion noted afterwards.       Communication   Communication: No difficulties  Cognition Arousal/Alertness: Awake/alert Behavior During Therapy: WFL for tasks assessed/performed;Flat affect Overall Cognitive Status: Within Functional Limits for tasks assessed (Inconsistent with command following, appears to have slight bits of confusion.)                      General Comments General comments (skin integrity, edema, etc.): No obvious signs of bruising/swelling around L thigh. From marginal vantage point, no indications of infection seen around L incision.     Exercises        Assessment/Plan    PT Assessment Patient needs continued PT services  PT Diagnosis Difficulty walking;Generalized weakness;Acute pain   PT Problem List Decreased strength;Decreased range of motion;Decreased activity tolerance;Decreased balance;Decreased mobility;Decreased knowledge of use of DME;Pain  PT Treatment Interventions Gait training;DME instruction;Therapeutic exercise;Therapeutic activities;Balance training   PT Goals (Current goals can be found in the Care Plan section) Acute Rehab PT Goals Patient Stated Goal: to get stronger PT Goal Formulation: With patient Time For Goal Achievement: 12/01/15 Potential to Achieve Goals: Fair    Frequency 7X/week   Barriers to discharge        Co-evaluation               End of Session Equipment Utilized During Treatment: Gait belt Activity Tolerance: Patient limited by pain Patient left: in chair;with chair alarm set;with family/visitor present;with call bell/phone within reach           Time: 1340-1357 PT Time Calculation (min) (ACUTE ONLY): 17 min   Charges:   PT Evaluation $PT Eval Moderate Complexity: 1 Procedure     PT G Codes:        Kerman Passey, PT, DPT    11/17/2015, 6:13 PM

## 2015-11-17 NOTE — Consult Note (Signed)
Patient is 6 days status post total hip replacement. His x-rays have been reviewed and appear stable. He will continue with therapy as tolerated We'll follow with you he is hospitalized

## 2015-11-18 DIAGNOSIS — J9601 Acute respiratory failure with hypoxia: Secondary | ICD-10-CM | POA: Diagnosis not present

## 2015-11-18 DIAGNOSIS — M4800 Spinal stenosis, site unspecified: Secondary | ICD-10-CM | POA: Diagnosis not present

## 2015-11-18 DIAGNOSIS — I482 Chronic atrial fibrillation: Secondary | ICD-10-CM | POA: Diagnosis not present

## 2015-11-18 DIAGNOSIS — J181 Lobar pneumonia, unspecified organism: Secondary | ICD-10-CM | POA: Diagnosis not present

## 2015-11-18 DIAGNOSIS — M25552 Pain in left hip: Secondary | ICD-10-CM | POA: Diagnosis not present

## 2015-11-18 DIAGNOSIS — Z96642 Presence of left artificial hip joint: Secondary | ICD-10-CM | POA: Diagnosis not present

## 2015-11-18 DIAGNOSIS — I252 Old myocardial infarction: Secondary | ICD-10-CM | POA: Diagnosis not present

## 2015-11-18 DIAGNOSIS — R339 Retention of urine, unspecified: Secondary | ICD-10-CM | POA: Diagnosis not present

## 2015-11-18 DIAGNOSIS — I2510 Atherosclerotic heart disease of native coronary artery without angina pectoris: Secondary | ICD-10-CM | POA: Diagnosis not present

## 2015-11-18 DIAGNOSIS — Z7982 Long term (current) use of aspirin: Secondary | ICD-10-CM | POA: Diagnosis not present

## 2015-11-18 DIAGNOSIS — I4891 Unspecified atrial fibrillation: Secondary | ICD-10-CM | POA: Diagnosis not present

## 2015-11-18 DIAGNOSIS — I10 Essential (primary) hypertension: Secondary | ICD-10-CM | POA: Diagnosis not present

## 2015-11-18 DIAGNOSIS — K219 Gastro-esophageal reflux disease without esophagitis: Secondary | ICD-10-CM | POA: Diagnosis not present

## 2015-11-18 DIAGNOSIS — N401 Enlarged prostate with lower urinary tract symptoms: Secondary | ICD-10-CM | POA: Diagnosis not present

## 2015-11-18 DIAGNOSIS — M6281 Muscle weakness (generalized): Secondary | ICD-10-CM | POA: Diagnosis not present

## 2015-11-18 DIAGNOSIS — M1990 Unspecified osteoarthritis, unspecified site: Secondary | ICD-10-CM | POA: Diagnosis not present

## 2015-11-18 DIAGNOSIS — E785 Hyperlipidemia, unspecified: Secondary | ICD-10-CM | POA: Diagnosis not present

## 2015-11-18 DIAGNOSIS — Z951 Presence of aortocoronary bypass graft: Secondary | ICD-10-CM | POA: Diagnosis not present

## 2015-11-18 DIAGNOSIS — Z4789 Encounter for other orthopedic aftercare: Secondary | ICD-10-CM | POA: Diagnosis not present

## 2015-11-18 DIAGNOSIS — Z794 Long term (current) use of insulin: Secondary | ICD-10-CM | POA: Diagnosis not present

## 2015-11-18 DIAGNOSIS — M109 Gout, unspecified: Secondary | ICD-10-CM | POA: Diagnosis not present

## 2015-11-18 DIAGNOSIS — D649 Anemia, unspecified: Secondary | ICD-10-CM | POA: Diagnosis not present

## 2015-11-18 DIAGNOSIS — I509 Heart failure, unspecified: Secondary | ICD-10-CM | POA: Diagnosis not present

## 2015-11-18 DIAGNOSIS — I701 Atherosclerosis of renal artery: Secondary | ICD-10-CM | POA: Diagnosis not present

## 2015-11-18 DIAGNOSIS — J189 Pneumonia, unspecified organism: Secondary | ICD-10-CM | POA: Diagnosis not present

## 2015-11-18 DIAGNOSIS — E119 Type 2 diabetes mellitus without complications: Secondary | ICD-10-CM | POA: Diagnosis not present

## 2015-11-18 LAB — GLUCOSE, CAPILLARY
Glucose-Capillary: 181 mg/dL — ABNORMAL HIGH (ref 65–99)
Glucose-Capillary: 204 mg/dL — ABNORMAL HIGH (ref 65–99)

## 2015-11-18 LAB — BASIC METABOLIC PANEL
Anion gap: 10 (ref 5–15)
BUN: 15 mg/dL (ref 6–20)
CALCIUM: 8.2 mg/dL — AB (ref 8.9–10.3)
CO2: 27 mmol/L (ref 22–32)
CREATININE: 1.07 mg/dL (ref 0.61–1.24)
Chloride: 98 mmol/L — ABNORMAL LOW (ref 101–111)
GFR calc Af Amer: >60 mL/min (ref >60)
GLUCOSE: 163 mg/dL — AB (ref 65–99)
Potassium: 3.6 mmol/L (ref 3.5–5.1)
Sodium: 135 mmol/L (ref 135–145)

## 2015-11-18 LAB — CBC
HCT: 30 % — ABNORMAL LOW (ref 40.0–52.0)
Hemoglobin: 10.6 g/dL — ABNORMAL LOW (ref 13.0–18.0)
MCH: 33.6 pg (ref 26.0–34.0)
MCHC: 35.5 g/dL (ref 32.0–36.0)
MCV: 94.6 fL (ref 80.0–100.0)
Platelets: 226 10*3/uL (ref 150–440)
RBC: 3.17 MIL/uL — ABNORMAL LOW (ref 4.40–5.90)
RDW: 15.8 % — AB (ref 11.5–14.5)
WBC: 5.3 10*3/uL (ref 3.8–10.6)

## 2015-11-18 LAB — PROTIME-INR
INR: 1.37
PROTHROMBIN TIME: 17 s — AB (ref 11.4–15.0)

## 2015-11-18 MED ORDER — HYDROCODONE-ACETAMINOPHEN 10-325 MG PO TABS: 1.0000 | ORAL_TABLET | Freq: Four times a day (QID) | ORAL | Status: DC | PRN

## 2015-11-18 MED ORDER — WARFARIN SODIUM 4 MG PO TABS: 4.0000 mg | ORAL_TABLET | Freq: Every day | ORAL | Status: DC

## 2015-11-18 MED ORDER — WARFARIN - PHARMACIST DOSING INPATIENT: Freq: Every day | Status: DC

## 2015-11-18 MED ORDER — LEVOFLOXACIN 750 MG PO TABS: 750.0000 mg | ORAL_TABLET | Freq: Every day | ORAL | Status: AC

## 2015-11-18 NOTE — Progress Notes (Signed)
ANTICOAGULATION CONSULT NOTE - Initial Consult  Pharmacy Consult for warfarin Indication: VTE prophylaxis  Allergies  Allergen Reactions  . Allopurinol Diarrhea, Other (See Comments) and Nausea And Vomiting    Other Reaction: GI Upset  . Atenolol Other (See Comments)    Other Reaction: bradycardia  . Atorvastatin Other (See Comments)    Other Reaction: muscle aches  . Ramipril Rash  . Simvastatin Other (See Comments)    Other Reaction: MUSCLE ACHES (Zocor)  . Valsartan Other (See Comments)    Other Reaction: facial swelling  . Rosuvastatin Other (See Comments)    Muscle aches  . Cardizem [Diltiazem] Rash    Patient Measurements: Height: 5' 8"  (172.7 cm) Weight: 190 lb (86.183 kg) IBW/kg (Calculated) : 68.4  Vital Signs: Temp: 99 F (37.2 C) (03/07 0404) Temp Source: Oral (03/07 0404) BP: 130/61 mmHg (03/07 0404) Pulse Rate: 59 (03/07 0404)  Labs:  Recent Labs  11/17/15 0425 11/18/15 0523  HGB 10.4* 10.6*  HCT 30.3* 30.0*  PLT 215 226  LABPROT 16.3* 17.0*  INR 1.30 1.37  CREATININE 0.98 1.07  TROPONINI <0.03  --     Estimated Creatinine Clearance: 67.6 mL/min (by C-G formula based on Cr of 1.07).  Assessment: Pharmacy consulted to dose warfarin in this 70 year old male who is post-op from total hip arthroplasty today. Patient was taking warfarin prior to admission for atrial fibrillation with a home dose of 4 mg daily. Patient held warfarin since 2/22.Resumed 2/28. Patient has gotten 6 doses. INR on admission is 1.3.  3/6 INR 1.3 (41m warfarin given) 3/7 0500 INR: 1.37    Goal of Therapy:  INR 2-3 Monitor platelets by anticoagulation protocol: Yes   Plan:  Will resume patient home dose 42mdaily tonight. If INR not close to therapeutic in a couple days will need to consider increasing daily dose. Recheck INR in AM.   MeRamond DialPharmD Clinical Pharmacist 11/18/2015,7:23 AM

## 2015-11-18 NOTE — Progress Notes (Signed)
Physical Therapy Treatment Patient Details Name: Chase Cabrera MRN: [id number] DOB: 23-Nov-1945 Today's Date: 11/18/2015    History of Present Illness Patient is a 70 y/o male that presents after fall at rehab facility, noted to be hypoxic as well. Patient recently had L THR.     PT Comments    Pt requires strong encouragement/cueing to complete mobility tasks, yet demonstrating improvement today. Increased time/effort for mobility activities; however, less physical assist required. Pt improving ambulation distance; requires heavy technique and sequencing cueing. Pt wished back to bed, but encouraged to sit up in chair for and hour or two; pt agreeable and comfortable up in chair. Encouraged basic exercises of ankle pumps, quad and glut sets. At the time of this documentation, pt now has discharge orders back to rehab today.   Follow Up Recommendations  SNF     Equipment Recommendations       Recommendations for Other Services       Precautions / Restrictions Restrictions Weight Bearing Restrictions: Yes LLE Weight Bearing: Weight bearing as tolerated    Mobility  Bed Mobility Overal bed mobility: Needs Assistance Bed Mobility: Supine to Sit     Supine to sit: Min assist     General bed mobility comments: Requires strong encouragement cueing for sequence; Min A to LLE to edge of bed; encouraged to scoot on his own. Significantly increased time  Transfers Overall transfer level: Needs assistance Equipment used: Rolling walker (2 wheeled) Transfers: Sit to/from Stand Sit to Stand: Min guard         General transfer comment: cues for safe hand placement. Strong encouragement; slow to rise/sit  Ambulation/Gait Ambulation/Gait assistance: Min guard Ambulation Distance (Feet): 35 Feet Assistive device: Rolling walker (2 wheeled) Gait Pattern/deviations: Step-through pattern;Step-to pattern;Trunk flexed (partial step through) Gait velocity: slow Gait velocity  interpretation: <1.8 ft/sec, indicative of risk for recurrent falls General Gait Details: Rw lowered to for appropriate height and to allow greater push through UEs. Initially LLE step too large creating increased pain with R step. Encouraged smaller equal steps.Improving. Poor forward flexed posture that pt can correct with cues.    Stairs            Wheelchair Mobility    Modified Rankin (Stroke Patients Only)       Balance Overall balance assessment: Needs assistance Sitting-balance support: No upper extremity supported;Feet supported Sitting balance-Leahy Scale: Good     Standing balance support: Bilateral upper extremity supported Standing balance-Leahy Scale: Fair                      Cognition Arousal/Alertness: Awake/alert Behavior During Therapy: WFL for tasks assessed/performed;Flat affect Overall Cognitive Status: Within Functional Limits for tasks assessed                      Exercises General Exercises - Lower Extremity Ankle Circles/Pumps: AROM;Both;20 reps (long sit) Quad Sets: Strengthening;Both;20 reps (long sit) Gluteal Sets: Strengthening;Both;20 reps (long sit)    General Comments        Pertinent Vitals/Pain Pain Assessment: 0-10 Pain Score: 5  Pain Location: L knee Pain Descriptors / Indicators: Aching;Sharp Pain Intervention(s): Premedicated before session;Repositioned;Monitored during session    Home Living                      Prior Function            PT Goals (current goals can now be found in the care plan  section) Progress towards PT goals: Progressing toward goals    Frequency  7X/week    PT Plan Current plan remains appropriate    Co-evaluation             End of Session Equipment Utilized During Treatment: Gait belt Activity Tolerance: Patient limited by pain;Patient limited by fatigue Patient left: in chair;with call bell/phone within reach;with chair alarm set;with family/visitor  present     Time: 1216-2446 PT Time Calculation (min) (ACUTE ONLY): 33 min  Charges:  $Gait Training: 8-22 mins $Therapeutic Exercise: 8-22 mins                    G Codes:      Charlaine Dalton, PTA 11/18/2015, 11:15 AM

## 2015-11-18 NOTE — Progress Notes (Signed)
Report called to julie at [facility] . I/s nurse to have pt continue incentive spirometer  Hourly  And start levaquin this evening . Understanding voiced.awaiting ems

## 2015-11-18 NOTE — Progress Notes (Signed)
Pt left via ems for [facility]

## 2015-11-18 NOTE — Discharge Summary (Signed)
[facility] [place] NAME: Chase Cabrera    MR#:  [id number]  DATE OF BIRTH:  1946/01/27  DATE OF ADMISSION:  11/17/2015 ADMITTING PHYSICIAN: Henreitta Leber, MD  DATE OF DISCHARGE: 11/18/2015  PRIMARY CARE PHYSICIAN: Kirk Ruths., MD    ADMISSION DIAGNOSIS:  Healthcare-associated pneumonia [J18.9] Hip pain, left [M25.552]  DISCHARGE DIAGNOSIS:  Active Problems:   Pneumonia   SECONDARY DIAGNOSIS:   Past Medical History  Diagnosis Date  . Myocardial infarction ([place])   . Arthritis   . Diabetes mellitus without complication ([place])   . Low blood potassium   . Hypertension   . CHF (congestive heart failure) ([place])   . Cancer ([place])     skin face  . Hyperlipemia   . Atrial fibrillation ([place])   . Spinal stenosis   . Renal artery stenosis ([place])   . Corneal dystrophy     bilateral  . Hyponatremia   . Urinary retention   . Gout   . Cough, persistent     HOSPITAL COURSE:   70 year old male with past medical history of coronary artery disease status post stents, osteoarthritis, history of chronic afibrillation, history of CHF, gout, urinary retention, diabetes, diabetic neuropathy who presents to the hospital after a fall and noted to have some left hip pain and also noted to be hypoxic.  #1 acute respiratory failure with hypoxia-this was due to pneumonia.  Patient was admitted to the hospital started on aggressive treatment with IV vancomycin, Zosyn. He has clinically improved and is now weaned off oxygen and O2 saturations are stable.  #2 pneumonia-probably the hospital patient was treated with IV vancomycin, Zosyn. He is afebrile, normal white cell count is normal. He is now being discharged on oral Levaquin.  #3 type 2 diabetes without complication- his BS remained stable.  He will cont. His 75/25 Novolin, Metformin, Januvia upon discharge.   #4 diabetic neuropathy- pt. Will cont. gabapentin, Lyrica.  #5 history  of BPH- pt. Will continue Flomax.  #6 left hip pain-status post recent left hip hemiarthroplasty. X-ray showing no evidence of acute fracture. - pt. Was seen by Orthopedics and recommended continuing PT and pain management.   #7 history of chronic atrial fibrillation- patient remained rate controlled while in the hospital. He will Continue Coreg. - he will Continue Coumadin. INR was subtherapeutic but can be further followed up as an outpatient.  #8 history of gout- will continue colchicine, probenecid. No acute attack.  #9 history of hypertension- he will continue Norvasc, Coreg, Imdur, hydralazine.  #10 hyperlipidemia- he will continue Pravachol.  #11 GERD- he will continue Protonix.  DISCHARGE CONDITIONS:   Stable.   CONSULTS OBTAINED:  Treatment Team:  Hessie Knows, MD  DRUG ALLERGIES:   Allergies  Allergen Reactions  . Allopurinol Diarrhea, Other (See Comments) and Nausea And Vomiting    Other Reaction: GI Upset  . Atenolol Other (See Comments)    Other Reaction: bradycardia  . Atorvastatin Other (See Comments)    Other Reaction: muscle aches  . Ramipril Rash  . Simvastatin Other (See Comments)    Other Reaction: MUSCLE ACHES (Zocor)  . Valsartan Other (See Comments)    Other Reaction: facial swelling  . Rosuvastatin Other (See Comments)    Muscle aches  . Cardizem [Diltiazem] Rash    DISCHARGE MEDICATIONS:   Current Discharge Medication List    START taking these medications   Details  levofloxacin (LEVAQUIN) 750 MG tablet Take 1 tablet (750  mg total) by mouth daily. Qty: 5 tablet, Refills: 0      CONTINUE these medications which have CHANGED   Details  HYDROcodone-acetaminophen (NORCO) 10-325 MG tablet Take 1 tablet by mouth every 6 (six) hours as needed. Qty: 30 tablet, Refills: 0      CONTINUE these medications which have NOT CHANGED   Details  amLODipine (NORVASC) 10 MG tablet Take 1 tablet by mouth daily.    aspirin EC 81 MG tablet Take 1  tablet by mouth daily.    carvedilol (COREG) 25 MG tablet Take 25 mg by mouth 2 (two) times daily with a meal.  Refills: 1    clopidogrel (PLAVIX) 75 MG tablet Take 75 mg by mouth daily.    colchicine 0.6 MG tablet Take 1 tablet by mouth daily. Refills: 0    diclofenac sodium (VOLTAREN) 1 % GEL Apply 2 g topically 4 (four) times daily.    dicyclomine (BENTYL) 10 MG capsule Take 10 mg by mouth 4 (four) times daily.    furosemide (LASIX) 20 MG tablet Take 20 mg by mouth daily.    gabapentin (NEURONTIN) 300 MG capsule Take 1 capsule by mouth 3 (three) times daily.    HUMALOG MIX 75/25 KWIKPEN (75-25) 100 UNIT/ML Kwikpen Inject 30-44 Units into the skin 2 (two) times daily. 30 units every morning and 44 units in the evening. Refills: 0    hydrALAZINE (APRESOLINE) 100 MG tablet Take 1 tablet by mouth 3 (three) times daily.    isosorbide mononitrate (IMDUR) 120 MG 24 hr tablet Take 1 tablet by mouth daily.    JANUVIA 100 MG tablet Take 1 tablet by mouth daily.    LYRICA 75 MG capsule Take 75 mg by mouth 2 (two) times daily. Refills: 0    metFORMIN (GLUCOPHAGE) 1000 MG tablet Take 1 tablet by mouth 2 (two) times daily. Refills: 0    Multiple Vitamin (MULTI-VITAMINS) TABS Take 1 tablet by mouth daily.    nitroGLYCERIN (NITROSTAT) 0.4 MG SL tablet Place 0.4 mg under the tongue every 5 (five) minutes as needed for chest pain.    Omega-3 Fatty Acids (FISH OIL) 1000 MG CAPS Take 1 capsule by mouth at bedtime.    omeprazole (PRILOSEC) 40 MG capsule Take 1 capsule by mouth 2 (two) times daily.    potassium chloride (K-DUR,KLOR-CON) 10 MEQ tablet Take 10 mEq by mouth 2 (two) times daily.     pravastatin (PRAVACHOL) 40 MG tablet Take 40 mg by mouth at bedtime.  Refills: 0    probenecid (BENEMID) 500 MG tablet Take 1 tablet by mouth 2 (two) times daily. Refills: 0    tamsulosin (FLOMAX) 0.4 MG CAPS capsule Take 1 capsule by mouth at bedtime.     vitamin C (ASCORBIC ACID) 500 MG  tablet Take 1,000 mg by mouth daily.    Vitamin E 400 units TABS Take 400 Units by mouth 2 (two) times daily.    warfarin (COUMADIN) 4 MG tablet Take 4 mg by mouth daily at 6 PM.         DISCHARGE INSTRUCTIONS:   DIET:  Cardiac diet and Diabetic diet  DISCHARGE CONDITION:  Stable  ACTIVITY:  Activity as tolerated  OXYGEN:  Home Oxygen: No.   Oxygen Delivery: room air  DISCHARGE LOCATION:  nursing home   If you experience worsening of your admission symptoms, develop shortness of breath, life threatening emergency, suicidal or homicidal thoughts you must seek medical attention immediately by calling 911 or calling your MD  immediately  if symptoms less severe.  You Must read complete instructions/literature along with all the possible adverse reactions/side effects for all the Medicines you take and that have been prescribed to you. Take any new Medicines after you have completely understood and accpet all the possible adverse reactions/side effects.   Please note  You were cared for by a hospitalist during your hospital stay. If you have any questions about your discharge medications or the care you received while you were in the hospital after you are discharged, you can call the unit and asked to speak with the hospitalist on call if the hospitalist that took care of you is not available. Once you are discharged, your primary care physician will handle any further medical issues. Please note that NO REFILLS for any discharge medications will be authorized once you are discharged, as it is imperative that you return to your primary care physician (or establish a relationship with a primary care physician if you do not have one) for your aftercare needs so that they can reassess your need for medications and monitor your lab values.     Today   Pt. Complaining of some wheezing, cough.  Afebrile.  Hemodynamically stable.  Wife at bedside.   VITAL SIGNS:  Blood pressure  119/45, pulse 65, temperature 99.7 F (37.6 C), temperature source Oral, resp. rate 18, height 5' 8"  (1.727 m), weight 86.183 kg (190 lb), SpO2 97 %.  I/O:   Intake/Output Summary (Last 24 hours) at 11/18/15 1100 Last data filed at 11/18/15 0900  Gross per 24 hour  Intake   1100 ml  Output   1125 ml  Net    -25 ml    PHYSICAL EXAMINATION:  GENERAL:  70 y.o.-year-old patient lying in the bed with no acute distress.  EYES: Pupils equal, round, reactive to light and accommodation. No scleral icterus. Extraocular muscles intact.  HEENT: Head atraumatic, normocephalic. Oropharynx and nasopharynx clear.  NECK:  Supple, no jugular venous distention. No thyroid enlargement, no tenderness.  LUNGS: Normal breath sounds bilaterally, no wheezing, rales,rhonchi. No use of accessory muscles of respiration.  CARDIOVASCULAR: S1, S2 normal. No murmurs, rubs, or gallops.  ABDOMEN: Soft, non-tender, non-distended. Bowel sounds present. No organomegaly or mass.  EXTREMITIES: No pedal edema, cyanosis, or clubbing.  Left Hip dressing in place with no drainage.  NEUROLOGIC: Cranial nerves II through XII are intact. No focal motor or sensory defecits b/l.  PSYCHIATRIC: The patient is alert and oriented x 3.   SKIN: No obvious rash, lesion, or ulcer.   DATA REVIEW:   CBC  Recent Labs Lab 11/18/15 0523  WBC 5.3  HGB 10.6*  HCT 30.0*  PLT 226    Chemistries   Recent Labs Lab 11/17/15 0425 11/18/15 0523  NA 133* 135  K 3.8 3.6  CL 98* 98*  CO2 24 27  GLUCOSE 202* 163*  BUN 17 15  CREATININE 0.98 1.07  CALCIUM 8.5* 8.2*  AST 41  --   ALT 42  --   ALKPHOS 90  --   BILITOT 1.0  --     Cardiac Enzymes  Recent Labs Lab 11/17/15 0425  TROPONINI <0.03    Microbiology Results  Results for orders placed or performed during the hospital encounter of 11/17/15  Blood culture (routine x 2)     Status: None (Preliminary result)   Collection Time: 11/17/15  7:19 AM  Result Value Ref Range  Status   Specimen Description BLOOD RIGHT HAND  Final  Special Requests BOTTLES DRAWN AEROBIC AND ANAEROBIC  2CC  Final   Culture NO GROWTH 1 DAY  Final   Report Status PENDING  Incomplete  Blood culture (routine x 2)     Status: None (Preliminary result)   Collection Time: 11/17/15  7:24 AM  Result Value Ref Range Status   Specimen Description BLOOD RIGHT HAND  Final   Special Requests BOTTLES DRAWN AEROBIC AND ANAEROBIC  1CC  Final   Culture NO GROWTH 1 DAY  Final   Report Status PENDING  Incomplete    RADIOLOGY:  Dg Chest 1 View  11/17/2015  CLINICAL DATA:  Sinus drainage and productive cough.  On oxygen. EXAM: CHEST 1 VIEW COMPARISON:  07/03/2015 FINDINGS: Postoperative changes in the mediastinum. Cardiac enlargement with mild vascular congestion. No significant edema. Superimposed infiltration demonstrated in the left lung base behind the heart may represent superimposed pneumonia. Calcified granulomas in the lungs. No blunting of costophrenic angles. No pneumothorax. IMPRESSION: Cardiac enlargement with vascular congestion. No edema. Superimposed infiltration in the left lung base behind the heart may indicate pneumonia. Electronically Signed   By: Lucienne Capers M.D.   On: 11/17/2015 05:19   Dg Hip Unilat With Pelvis 2-3 Views Left  11/17/2015  CLINICAL DATA:  Initial evaluation for acute hip pain. EXAM: DG HIP (WITH OR WITHOUT PELVIS) 2-3V LEFT COMPARISON:  Prior study from 11/11/2015. FINDINGS: Skin staples remain in place. Left total hip arthroplasty in place and appears to be appropriately positioned. The acetabular and femoral components articulate normally with 1 another. No periprosthetic lucency to suggest loosening or infection. No fracture or dislocation. Remainder of the pelvis is intact. No acute abnormality about the right hip. Spinal fusion hardware present within the lower lumbar spine. Osteopenia noted. No acute soft tissue abnormality. IMPRESSION: Left hip arthroplasty in  place without complication. No acute fracture or dislocation. Electronically Signed   By: Jeannine Boga M.D.   On: 11/17/2015 05:28      Management plans discussed with the patient, family and they are in agreement.  CODE STATUS:     Code Status Orders        Start     Ordered   11/17/15 1048  Full code   Continuous     11/17/15 1047    Code Status History    Date Active Date Inactive Code Status Order ID Comments User Context   This patient has a current code status but no historical code status.      TOTAL TIME TAKING CARE OF THIS PATIENT: 40 minutes.    Henreitta Leber M.D on 11/18/2015 at 11:00 AM  Between 7am to 6pm - Pager - [phone]  After 6pm go to [email or web address] - password [email or web address] [facility]  Office  [phone]  CC: Primary care physician; Kirk Ruths., MD

## 2015-11-18 NOTE — Progress Notes (Signed)
Patient is medically stable for D/C back to [facility] today. Per [facility] admissions coordinator at [facility] patient will go to room 502. RN will call report to 500 hall and arrange EMS for transport. Clinical Education officer, museum (CSW) sent D/C Summary, FL2 and D/C Packet to [employer] via [employer]. Patient is aware of above. Patient's wife [name] is at bedside and aware of above. Please reconsult if future social work needs arise. CSW signing off.   Blima Rich, LCSW [phone]

## 2015-11-19 DIAGNOSIS — E119 Type 2 diabetes mellitus without complications: Secondary | ICD-10-CM | POA: Diagnosis not present

## 2015-11-19 DIAGNOSIS — I4891 Unspecified atrial fibrillation: Secondary | ICD-10-CM | POA: Diagnosis not present

## 2015-11-19 DIAGNOSIS — D649 Anemia, unspecified: Secondary | ICD-10-CM | POA: Diagnosis not present

## 2015-11-19 DIAGNOSIS — I482 Chronic atrial fibrillation: Secondary | ICD-10-CM | POA: Diagnosis not present

## 2015-11-19 DIAGNOSIS — I10 Essential (primary) hypertension: Secondary | ICD-10-CM | POA: Diagnosis not present

## 2015-11-19 DIAGNOSIS — J189 Pneumonia, unspecified organism: Secondary | ICD-10-CM | POA: Diagnosis not present

## 2015-11-19 DIAGNOSIS — I2510 Atherosclerotic heart disease of native coronary artery without angina pectoris: Secondary | ICD-10-CM | POA: Diagnosis not present

## 2015-11-19 DIAGNOSIS — Z4789 Encounter for other orthopedic aftercare: Secondary | ICD-10-CM | POA: Diagnosis not present

## 2015-11-22 LAB — CULTURE, BLOOD (ROUTINE X 2)
CULTURE: NO GROWTH
CULTURE: NO GROWTH

## 2015-11-25 DIAGNOSIS — I482 Chronic atrial fibrillation: Secondary | ICD-10-CM | POA: Diagnosis not present

## 2015-11-25 DIAGNOSIS — J189 Pneumonia, unspecified organism: Secondary | ICD-10-CM | POA: Diagnosis not present

## 2015-11-25 DIAGNOSIS — Z4789 Encounter for other orthopedic aftercare: Secondary | ICD-10-CM | POA: Diagnosis not present

## 2015-12-02 DIAGNOSIS — Z4789 Encounter for other orthopedic aftercare: Secondary | ICD-10-CM | POA: Diagnosis not present

## 2015-12-02 DIAGNOSIS — J189 Pneumonia, unspecified organism: Secondary | ICD-10-CM | POA: Diagnosis not present

## 2015-12-02 DIAGNOSIS — I482 Chronic atrial fibrillation: Secondary | ICD-10-CM | POA: Diagnosis not present

## 2015-12-09 DIAGNOSIS — I2510 Atherosclerotic heart disease of native coronary artery without angina pectoris: Secondary | ICD-10-CM | POA: Diagnosis not present

## 2015-12-09 DIAGNOSIS — M4806 Spinal stenosis, lumbar region: Secondary | ICD-10-CM | POA: Diagnosis not present

## 2015-12-09 DIAGNOSIS — Z471 Aftercare following joint replacement surgery: Secondary | ICD-10-CM | POA: Diagnosis not present

## 2015-12-09 DIAGNOSIS — I110 Hypertensive heart disease with heart failure: Secondary | ICD-10-CM | POA: Diagnosis not present

## 2015-12-09 DIAGNOSIS — E1142 Type 2 diabetes mellitus with diabetic polyneuropathy: Secondary | ICD-10-CM | POA: Diagnosis not present

## 2015-12-09 DIAGNOSIS — M1990 Unspecified osteoarthritis, unspecified site: Secondary | ICD-10-CM | POA: Diagnosis not present

## 2015-12-12 DIAGNOSIS — E1142 Type 2 diabetes mellitus with diabetic polyneuropathy: Secondary | ICD-10-CM | POA: Diagnosis not present

## 2015-12-12 DIAGNOSIS — I110 Hypertensive heart disease with heart failure: Secondary | ICD-10-CM | POA: Diagnosis not present

## 2015-12-12 DIAGNOSIS — Z471 Aftercare following joint replacement surgery: Secondary | ICD-10-CM | POA: Diagnosis not present

## 2015-12-12 DIAGNOSIS — I2510 Atherosclerotic heart disease of native coronary artery without angina pectoris: Secondary | ICD-10-CM | POA: Diagnosis not present

## 2015-12-12 DIAGNOSIS — M4806 Spinal stenosis, lumbar region: Secondary | ICD-10-CM | POA: Diagnosis not present

## 2015-12-12 DIAGNOSIS — M1990 Unspecified osteoarthritis, unspecified site: Secondary | ICD-10-CM | POA: Diagnosis not present

## 2015-12-15 DIAGNOSIS — I2510 Atherosclerotic heart disease of native coronary artery without angina pectoris: Secondary | ICD-10-CM | POA: Diagnosis not present

## 2015-12-15 DIAGNOSIS — Z471 Aftercare following joint replacement surgery: Secondary | ICD-10-CM | POA: Diagnosis not present

## 2015-12-15 DIAGNOSIS — I110 Hypertensive heart disease with heart failure: Secondary | ICD-10-CM | POA: Diagnosis not present

## 2015-12-15 DIAGNOSIS — M4806 Spinal stenosis, lumbar region: Secondary | ICD-10-CM | POA: Diagnosis not present

## 2015-12-15 DIAGNOSIS — E1142 Type 2 diabetes mellitus with diabetic polyneuropathy: Secondary | ICD-10-CM | POA: Diagnosis not present

## 2015-12-15 DIAGNOSIS — M1990 Unspecified osteoarthritis, unspecified site: Secondary | ICD-10-CM | POA: Diagnosis not present

## 2015-12-16 DIAGNOSIS — M4806 Spinal stenosis, lumbar region: Secondary | ICD-10-CM | POA: Diagnosis not present

## 2015-12-16 DIAGNOSIS — I110 Hypertensive heart disease with heart failure: Secondary | ICD-10-CM | POA: Diagnosis not present

## 2015-12-16 DIAGNOSIS — Z471 Aftercare following joint replacement surgery: Secondary | ICD-10-CM | POA: Diagnosis not present

## 2015-12-16 DIAGNOSIS — I2510 Atherosclerotic heart disease of native coronary artery without angina pectoris: Secondary | ICD-10-CM | POA: Diagnosis not present

## 2015-12-16 DIAGNOSIS — M1990 Unspecified osteoarthritis, unspecified site: Secondary | ICD-10-CM | POA: Diagnosis not present

## 2015-12-16 DIAGNOSIS — E1142 Type 2 diabetes mellitus with diabetic polyneuropathy: Secondary | ICD-10-CM | POA: Diagnosis not present

## 2015-12-17 DIAGNOSIS — M1990 Unspecified osteoarthritis, unspecified site: Secondary | ICD-10-CM | POA: Diagnosis not present

## 2015-12-17 DIAGNOSIS — I2510 Atherosclerotic heart disease of native coronary artery without angina pectoris: Secondary | ICD-10-CM | POA: Diagnosis not present

## 2015-12-17 DIAGNOSIS — E1142 Type 2 diabetes mellitus with diabetic polyneuropathy: Secondary | ICD-10-CM | POA: Diagnosis not present

## 2015-12-17 DIAGNOSIS — I110 Hypertensive heart disease with heart failure: Secondary | ICD-10-CM | POA: Diagnosis not present

## 2015-12-17 DIAGNOSIS — M4806 Spinal stenosis, lumbar region: Secondary | ICD-10-CM | POA: Diagnosis not present

## 2015-12-17 DIAGNOSIS — Z471 Aftercare following joint replacement surgery: Secondary | ICD-10-CM | POA: Diagnosis not present

## 2015-12-19 DIAGNOSIS — M4806 Spinal stenosis, lumbar region: Secondary | ICD-10-CM | POA: Diagnosis not present

## 2015-12-19 DIAGNOSIS — M1990 Unspecified osteoarthritis, unspecified site: Secondary | ICD-10-CM | POA: Diagnosis not present

## 2015-12-19 DIAGNOSIS — E1142 Type 2 diabetes mellitus with diabetic polyneuropathy: Secondary | ICD-10-CM | POA: Diagnosis not present

## 2015-12-19 DIAGNOSIS — Z471 Aftercare following joint replacement surgery: Secondary | ICD-10-CM | POA: Diagnosis not present

## 2015-12-19 DIAGNOSIS — I2510 Atherosclerotic heart disease of native coronary artery without angina pectoris: Secondary | ICD-10-CM | POA: Diagnosis not present

## 2015-12-19 DIAGNOSIS — I110 Hypertensive heart disease with heart failure: Secondary | ICD-10-CM | POA: Diagnosis not present

## 2015-12-22 DIAGNOSIS — Z471 Aftercare following joint replacement surgery: Secondary | ICD-10-CM | POA: Diagnosis not present

## 2015-12-22 DIAGNOSIS — I110 Hypertensive heart disease with heart failure: Secondary | ICD-10-CM | POA: Diagnosis not present

## 2015-12-22 DIAGNOSIS — M4806 Spinal stenosis, lumbar region: Secondary | ICD-10-CM | POA: Diagnosis not present

## 2015-12-22 DIAGNOSIS — M1990 Unspecified osteoarthritis, unspecified site: Secondary | ICD-10-CM | POA: Diagnosis not present

## 2015-12-22 DIAGNOSIS — I2510 Atherosclerotic heart disease of native coronary artery without angina pectoris: Secondary | ICD-10-CM | POA: Diagnosis not present

## 2015-12-22 DIAGNOSIS — E1142 Type 2 diabetes mellitus with diabetic polyneuropathy: Secondary | ICD-10-CM | POA: Diagnosis not present

## 2015-12-23 DIAGNOSIS — Z471 Aftercare following joint replacement surgery: Secondary | ICD-10-CM | POA: Diagnosis not present

## 2015-12-23 DIAGNOSIS — E1142 Type 2 diabetes mellitus with diabetic polyneuropathy: Secondary | ICD-10-CM | POA: Diagnosis not present

## 2015-12-23 DIAGNOSIS — I110 Hypertensive heart disease with heart failure: Secondary | ICD-10-CM | POA: Diagnosis not present

## 2015-12-23 DIAGNOSIS — M1990 Unspecified osteoarthritis, unspecified site: Secondary | ICD-10-CM | POA: Diagnosis not present

## 2015-12-23 DIAGNOSIS — M4806 Spinal stenosis, lumbar region: Secondary | ICD-10-CM | POA: Diagnosis not present

## 2015-12-23 DIAGNOSIS — I2510 Atherosclerotic heart disease of native coronary artery without angina pectoris: Secondary | ICD-10-CM | POA: Diagnosis not present

## 2015-12-24 DIAGNOSIS — M4806 Spinal stenosis, lumbar region: Secondary | ICD-10-CM | POA: Diagnosis not present

## 2015-12-24 DIAGNOSIS — Z96642 Presence of left artificial hip joint: Secondary | ICD-10-CM | POA: Diagnosis not present

## 2015-12-24 DIAGNOSIS — E1142 Type 2 diabetes mellitus with diabetic polyneuropathy: Secondary | ICD-10-CM | POA: Diagnosis not present

## 2015-12-24 DIAGNOSIS — Z471 Aftercare following joint replacement surgery: Secondary | ICD-10-CM | POA: Diagnosis not present

## 2015-12-24 DIAGNOSIS — I2510 Atherosclerotic heart disease of native coronary artery without angina pectoris: Secondary | ICD-10-CM | POA: Diagnosis not present

## 2015-12-24 DIAGNOSIS — I110 Hypertensive heart disease with heart failure: Secondary | ICD-10-CM | POA: Diagnosis not present

## 2015-12-24 DIAGNOSIS — M1990 Unspecified osteoarthritis, unspecified site: Secondary | ICD-10-CM | POA: Diagnosis not present

## 2015-12-26 DIAGNOSIS — M1990 Unspecified osteoarthritis, unspecified site: Secondary | ICD-10-CM | POA: Diagnosis not present

## 2015-12-26 DIAGNOSIS — M4806 Spinal stenosis, lumbar region: Secondary | ICD-10-CM | POA: Diagnosis not present

## 2015-12-26 DIAGNOSIS — E1142 Type 2 diabetes mellitus with diabetic polyneuropathy: Secondary | ICD-10-CM | POA: Diagnosis not present

## 2015-12-26 DIAGNOSIS — I110 Hypertensive heart disease with heart failure: Secondary | ICD-10-CM | POA: Diagnosis not present

## 2015-12-26 DIAGNOSIS — Z471 Aftercare following joint replacement surgery: Secondary | ICD-10-CM | POA: Diagnosis not present

## 2015-12-26 DIAGNOSIS — I2510 Atherosclerotic heart disease of native coronary artery without angina pectoris: Secondary | ICD-10-CM | POA: Diagnosis not present

## 2015-12-29 DIAGNOSIS — I110 Hypertensive heart disease with heart failure: Secondary | ICD-10-CM | POA: Diagnosis not present

## 2015-12-29 DIAGNOSIS — M4806 Spinal stenosis, lumbar region: Secondary | ICD-10-CM | POA: Diagnosis not present

## 2015-12-29 DIAGNOSIS — Z471 Aftercare following joint replacement surgery: Secondary | ICD-10-CM | POA: Diagnosis not present

## 2015-12-29 DIAGNOSIS — M1990 Unspecified osteoarthritis, unspecified site: Secondary | ICD-10-CM | POA: Diagnosis not present

## 2015-12-29 DIAGNOSIS — I2510 Atherosclerotic heart disease of native coronary artery without angina pectoris: Secondary | ICD-10-CM | POA: Diagnosis not present

## 2015-12-29 DIAGNOSIS — E1142 Type 2 diabetes mellitus with diabetic polyneuropathy: Secondary | ICD-10-CM | POA: Diagnosis not present

## 2015-12-30 DIAGNOSIS — E1142 Type 2 diabetes mellitus with diabetic polyneuropathy: Secondary | ICD-10-CM | POA: Diagnosis not present

## 2015-12-30 DIAGNOSIS — I2510 Atherosclerotic heart disease of native coronary artery without angina pectoris: Secondary | ICD-10-CM | POA: Diagnosis not present

## 2015-12-30 DIAGNOSIS — M4806 Spinal stenosis, lumbar region: Secondary | ICD-10-CM | POA: Diagnosis not present

## 2015-12-30 DIAGNOSIS — Z471 Aftercare following joint replacement surgery: Secondary | ICD-10-CM | POA: Diagnosis not present

## 2015-12-30 DIAGNOSIS — M1990 Unspecified osteoarthritis, unspecified site: Secondary | ICD-10-CM | POA: Diagnosis not present

## 2015-12-30 DIAGNOSIS — I110 Hypertensive heart disease with heart failure: Secondary | ICD-10-CM | POA: Diagnosis not present

## 2015-12-31 DIAGNOSIS — I110 Hypertensive heart disease with heart failure: Secondary | ICD-10-CM | POA: Diagnosis not present

## 2015-12-31 DIAGNOSIS — M1990 Unspecified osteoarthritis, unspecified site: Secondary | ICD-10-CM | POA: Diagnosis not present

## 2015-12-31 DIAGNOSIS — E1142 Type 2 diabetes mellitus with diabetic polyneuropathy: Secondary | ICD-10-CM | POA: Diagnosis not present

## 2015-12-31 DIAGNOSIS — I2510 Atherosclerotic heart disease of native coronary artery without angina pectoris: Secondary | ICD-10-CM | POA: Diagnosis not present

## 2015-12-31 DIAGNOSIS — Z471 Aftercare following joint replacement surgery: Secondary | ICD-10-CM | POA: Diagnosis not present

## 2015-12-31 DIAGNOSIS — M4806 Spinal stenosis, lumbar region: Secondary | ICD-10-CM | POA: Diagnosis not present

## 2016-01-01 DIAGNOSIS — E1122 Type 2 diabetes mellitus with diabetic chronic kidney disease: Secondary | ICD-10-CM | POA: Diagnosis not present

## 2016-01-01 DIAGNOSIS — I2510 Atherosclerotic heart disease of native coronary artery without angina pectoris: Secondary | ICD-10-CM | POA: Diagnosis not present

## 2016-01-01 DIAGNOSIS — I480 Paroxysmal atrial fibrillation: Secondary | ICD-10-CM | POA: Diagnosis not present

## 2016-01-01 DIAGNOSIS — I700 Atherosclerosis of aorta: Secondary | ICD-10-CM | POA: Diagnosis not present

## 2016-01-01 DIAGNOSIS — N182 Chronic kidney disease, stage 2 (mild): Secondary | ICD-10-CM | POA: Diagnosis not present

## 2016-01-01 DIAGNOSIS — R350 Frequency of micturition: Secondary | ICD-10-CM | POA: Diagnosis not present

## 2016-01-01 DIAGNOSIS — I10 Essential (primary) hypertension: Secondary | ICD-10-CM | POA: Diagnosis not present

## 2016-01-01 DIAGNOSIS — E1140 Type 2 diabetes mellitus with diabetic neuropathy, unspecified: Secondary | ICD-10-CM | POA: Diagnosis not present

## 2016-01-02 DIAGNOSIS — I110 Hypertensive heart disease with heart failure: Secondary | ICD-10-CM | POA: Diagnosis not present

## 2016-01-02 DIAGNOSIS — M4806 Spinal stenosis, lumbar region: Secondary | ICD-10-CM | POA: Diagnosis not present

## 2016-01-02 DIAGNOSIS — Z471 Aftercare following joint replacement surgery: Secondary | ICD-10-CM | POA: Diagnosis not present

## 2016-01-02 DIAGNOSIS — I2510 Atherosclerotic heart disease of native coronary artery without angina pectoris: Secondary | ICD-10-CM | POA: Diagnosis not present

## 2016-01-02 DIAGNOSIS — M1990 Unspecified osteoarthritis, unspecified site: Secondary | ICD-10-CM | POA: Diagnosis not present

## 2016-01-02 DIAGNOSIS — E1142 Type 2 diabetes mellitus with diabetic polyneuropathy: Secondary | ICD-10-CM | POA: Diagnosis not present

## 2016-01-05 DIAGNOSIS — I2510 Atherosclerotic heart disease of native coronary artery without angina pectoris: Secondary | ICD-10-CM | POA: Diagnosis not present

## 2016-01-05 DIAGNOSIS — M1990 Unspecified osteoarthritis, unspecified site: Secondary | ICD-10-CM | POA: Diagnosis not present

## 2016-01-05 DIAGNOSIS — Z471 Aftercare following joint replacement surgery: Secondary | ICD-10-CM | POA: Diagnosis not present

## 2016-01-05 DIAGNOSIS — M4806 Spinal stenosis, lumbar region: Secondary | ICD-10-CM | POA: Diagnosis not present

## 2016-01-05 DIAGNOSIS — I110 Hypertensive heart disease with heart failure: Secondary | ICD-10-CM | POA: Diagnosis not present

## 2016-01-05 DIAGNOSIS — E1142 Type 2 diabetes mellitus with diabetic polyneuropathy: Secondary | ICD-10-CM | POA: Diagnosis not present

## 2016-01-07 DIAGNOSIS — M1990 Unspecified osteoarthritis, unspecified site: Secondary | ICD-10-CM | POA: Diagnosis not present

## 2016-01-07 DIAGNOSIS — E1142 Type 2 diabetes mellitus with diabetic polyneuropathy: Secondary | ICD-10-CM | POA: Diagnosis not present

## 2016-01-07 DIAGNOSIS — Z471 Aftercare following joint replacement surgery: Secondary | ICD-10-CM | POA: Diagnosis not present

## 2016-01-07 DIAGNOSIS — M4806 Spinal stenosis, lumbar region: Secondary | ICD-10-CM | POA: Diagnosis not present

## 2016-01-07 DIAGNOSIS — I2510 Atherosclerotic heart disease of native coronary artery without angina pectoris: Secondary | ICD-10-CM | POA: Diagnosis not present

## 2016-01-07 DIAGNOSIS — I110 Hypertensive heart disease with heart failure: Secondary | ICD-10-CM | POA: Diagnosis not present

## 2016-01-08 DIAGNOSIS — E1142 Type 2 diabetes mellitus with diabetic polyneuropathy: Secondary | ICD-10-CM | POA: Diagnosis not present

## 2016-01-08 DIAGNOSIS — M1990 Unspecified osteoarthritis, unspecified site: Secondary | ICD-10-CM | POA: Diagnosis not present

## 2016-01-08 DIAGNOSIS — I110 Hypertensive heart disease with heart failure: Secondary | ICD-10-CM | POA: Diagnosis not present

## 2016-01-08 DIAGNOSIS — M4806 Spinal stenosis, lumbar region: Secondary | ICD-10-CM | POA: Diagnosis not present

## 2016-01-08 DIAGNOSIS — Z471 Aftercare following joint replacement surgery: Secondary | ICD-10-CM | POA: Diagnosis not present

## 2016-01-08 DIAGNOSIS — I2510 Atherosclerotic heart disease of native coronary artery without angina pectoris: Secondary | ICD-10-CM | POA: Diagnosis not present

## 2016-01-14 DIAGNOSIS — Z471 Aftercare following joint replacement surgery: Secondary | ICD-10-CM | POA: Diagnosis not present

## 2016-01-14 DIAGNOSIS — E1142 Type 2 diabetes mellitus with diabetic polyneuropathy: Secondary | ICD-10-CM | POA: Diagnosis not present

## 2016-01-14 DIAGNOSIS — I110 Hypertensive heart disease with heart failure: Secondary | ICD-10-CM | POA: Diagnosis not present

## 2016-01-14 DIAGNOSIS — I2510 Atherosclerotic heart disease of native coronary artery without angina pectoris: Secondary | ICD-10-CM | POA: Diagnosis not present

## 2016-01-14 DIAGNOSIS — M1990 Unspecified osteoarthritis, unspecified site: Secondary | ICD-10-CM | POA: Diagnosis not present

## 2016-01-14 DIAGNOSIS — M4806 Spinal stenosis, lumbar region: Secondary | ICD-10-CM | POA: Diagnosis not present

## 2016-01-15 DIAGNOSIS — M4806 Spinal stenosis, lumbar region: Secondary | ICD-10-CM | POA: Diagnosis not present

## 2016-01-15 DIAGNOSIS — E1142 Type 2 diabetes mellitus with diabetic polyneuropathy: Secondary | ICD-10-CM | POA: Diagnosis not present

## 2016-01-15 DIAGNOSIS — I110 Hypertensive heart disease with heart failure: Secondary | ICD-10-CM | POA: Diagnosis not present

## 2016-01-15 DIAGNOSIS — M1990 Unspecified osteoarthritis, unspecified site: Secondary | ICD-10-CM | POA: Diagnosis not present

## 2016-01-15 DIAGNOSIS — Z471 Aftercare following joint replacement surgery: Secondary | ICD-10-CM | POA: Diagnosis not present

## 2016-01-15 DIAGNOSIS — I2510 Atherosclerotic heart disease of native coronary artery without angina pectoris: Secondary | ICD-10-CM | POA: Diagnosis not present

## 2016-01-16 DIAGNOSIS — I110 Hypertensive heart disease with heart failure: Secondary | ICD-10-CM | POA: Diagnosis not present

## 2016-01-16 DIAGNOSIS — Z471 Aftercare following joint replacement surgery: Secondary | ICD-10-CM | POA: Diagnosis not present

## 2016-01-16 DIAGNOSIS — M4806 Spinal stenosis, lumbar region: Secondary | ICD-10-CM | POA: Diagnosis not present

## 2016-01-16 DIAGNOSIS — I2510 Atherosclerotic heart disease of native coronary artery without angina pectoris: Secondary | ICD-10-CM | POA: Diagnosis not present

## 2016-01-16 DIAGNOSIS — M1990 Unspecified osteoarthritis, unspecified site: Secondary | ICD-10-CM | POA: Diagnosis not present

## 2016-01-16 DIAGNOSIS — E1142 Type 2 diabetes mellitus with diabetic polyneuropathy: Secondary | ICD-10-CM | POA: Diagnosis not present

## 2016-01-19 DIAGNOSIS — Z471 Aftercare following joint replacement surgery: Secondary | ICD-10-CM | POA: Diagnosis not present

## 2016-01-19 DIAGNOSIS — M4806 Spinal stenosis, lumbar region: Secondary | ICD-10-CM | POA: Diagnosis not present

## 2016-01-19 DIAGNOSIS — I2510 Atherosclerotic heart disease of native coronary artery without angina pectoris: Secondary | ICD-10-CM | POA: Diagnosis not present

## 2016-01-19 DIAGNOSIS — I110 Hypertensive heart disease with heart failure: Secondary | ICD-10-CM | POA: Diagnosis not present

## 2016-01-19 DIAGNOSIS — E1142 Type 2 diabetes mellitus with diabetic polyneuropathy: Secondary | ICD-10-CM | POA: Diagnosis not present

## 2016-01-19 DIAGNOSIS — M1990 Unspecified osteoarthritis, unspecified site: Secondary | ICD-10-CM | POA: Diagnosis not present

## 2016-01-21 DIAGNOSIS — M1990 Unspecified osteoarthritis, unspecified site: Secondary | ICD-10-CM | POA: Diagnosis not present

## 2016-01-21 DIAGNOSIS — R350 Frequency of micturition: Secondary | ICD-10-CM | POA: Diagnosis not present

## 2016-01-21 DIAGNOSIS — E1142 Type 2 diabetes mellitus with diabetic polyneuropathy: Secondary | ICD-10-CM | POA: Diagnosis not present

## 2016-01-21 DIAGNOSIS — I2510 Atherosclerotic heart disease of native coronary artery without angina pectoris: Secondary | ICD-10-CM | POA: Diagnosis not present

## 2016-01-21 DIAGNOSIS — Z471 Aftercare following joint replacement surgery: Secondary | ICD-10-CM | POA: Diagnosis not present

## 2016-01-21 DIAGNOSIS — I110 Hypertensive heart disease with heart failure: Secondary | ICD-10-CM | POA: Diagnosis not present

## 2016-01-21 DIAGNOSIS — Z794 Long term (current) use of insulin: Secondary | ICD-10-CM | POA: Diagnosis not present

## 2016-01-21 DIAGNOSIS — M4806 Spinal stenosis, lumbar region: Secondary | ICD-10-CM | POA: Diagnosis not present

## 2016-01-21 DIAGNOSIS — I480 Paroxysmal atrial fibrillation: Secondary | ICD-10-CM | POA: Diagnosis not present

## 2016-01-22 DIAGNOSIS — M4806 Spinal stenosis, lumbar region: Secondary | ICD-10-CM | POA: Diagnosis not present

## 2016-01-22 DIAGNOSIS — M1990 Unspecified osteoarthritis, unspecified site: Secondary | ICD-10-CM | POA: Diagnosis not present

## 2016-01-22 DIAGNOSIS — I110 Hypertensive heart disease with heart failure: Secondary | ICD-10-CM | POA: Diagnosis not present

## 2016-01-22 DIAGNOSIS — I2510 Atherosclerotic heart disease of native coronary artery without angina pectoris: Secondary | ICD-10-CM | POA: Diagnosis not present

## 2016-01-22 DIAGNOSIS — E1142 Type 2 diabetes mellitus with diabetic polyneuropathy: Secondary | ICD-10-CM | POA: Diagnosis not present

## 2016-01-22 DIAGNOSIS — Z471 Aftercare following joint replacement surgery: Secondary | ICD-10-CM | POA: Diagnosis not present

## 2016-01-23 DIAGNOSIS — M1712 Unilateral primary osteoarthritis, left knee: Secondary | ICD-10-CM | POA: Diagnosis not present

## 2016-01-26 DIAGNOSIS — R197 Diarrhea, unspecified: Secondary | ICD-10-CM | POA: Diagnosis not present

## 2016-01-26 DIAGNOSIS — R194 Change in bowel habit: Secondary | ICD-10-CM | POA: Diagnosis not present

## 2016-01-26 DIAGNOSIS — K219 Gastro-esophageal reflux disease without esophagitis: Secondary | ICD-10-CM | POA: Diagnosis not present

## 2016-01-27 DIAGNOSIS — E1142 Type 2 diabetes mellitus with diabetic polyneuropathy: Secondary | ICD-10-CM | POA: Diagnosis not present

## 2016-01-27 DIAGNOSIS — E782 Mixed hyperlipidemia: Secondary | ICD-10-CM | POA: Diagnosis not present

## 2016-01-27 DIAGNOSIS — Z794 Long term (current) use of insulin: Secondary | ICD-10-CM | POA: Diagnosis not present

## 2016-02-27 DIAGNOSIS — M7989 Other specified soft tissue disorders: Secondary | ICD-10-CM | POA: Diagnosis not present

## 2016-02-27 DIAGNOSIS — Z7901 Long term (current) use of anticoagulants: Secondary | ICD-10-CM | POA: Diagnosis not present

## 2016-03-10 ENCOUNTER — Telehealth: Payer: Self-pay

## 2016-03-10 ENCOUNTER — Telehealth: Payer: Self-pay | Admitting: *Deleted

## 2016-03-10 NOTE — Telephone Encounter (Signed)
Received rx refill request for Clopidogrel 58m tablet. Pt has an appt to establish care in July with Dr. CLacinda Axon Please advise on fill prior to appointment. Pt has never had this rx filled from our facility.   [facility] in [place] [address]

## 2016-03-10 NOTE — Telephone Encounter (Signed)
Please advise, Patient needs a refill on his Plavix.  He is not our patient at this time.  Can you assist with this? thanks

## 2016-03-10 NOTE — Telephone Encounter (Signed)
Needs to come from current PCP. I have never seen him.

## 2016-03-23 ENCOUNTER — Other Ambulatory Visit: Payer: Self-pay | Admitting: Family Medicine

## 2016-03-24 ENCOUNTER — Encounter: Payer: Self-pay | Admitting: Family Medicine

## 2016-03-24 ENCOUNTER — Ambulatory Visit (INDEPENDENT_AMBULATORY_CARE_PROVIDER_SITE_OTHER): Payer: Medicare Other | Admitting: Family Medicine

## 2016-03-24 VITALS — BP 150/64 | HR 59 | Temp 97.8°F | Ht 66.25 in | Wt 190.2 lb

## 2016-03-24 DIAGNOSIS — E1142 Type 2 diabetes mellitus with diabetic polyneuropathy: Secondary | ICD-10-CM

## 2016-03-24 DIAGNOSIS — I2510 Atherosclerotic heart disease of native coronary artery without angina pectoris: Secondary | ICD-10-CM

## 2016-03-24 DIAGNOSIS — I4891 Unspecified atrial fibrillation: Secondary | ICD-10-CM | POA: Diagnosis not present

## 2016-03-24 DIAGNOSIS — E785 Hyperlipidemia, unspecified: Secondary | ICD-10-CM | POA: Diagnosis not present

## 2016-03-24 DIAGNOSIS — I10 Essential (primary) hypertension: Secondary | ICD-10-CM

## 2016-03-24 LAB — COMPREHENSIVE METABOLIC PANEL
ALT: 27 U/L (ref 0–53)
AST: 23 U/L (ref 0–37)
Albumin: 4.1 g/dL (ref 3.5–5.2)
Alkaline Phosphatase: 95 U/L (ref 39–117)
BILIRUBIN TOTAL: 0.4 mg/dL (ref 0.2–1.2)
BUN: 18 mg/dL (ref 6–23)
CALCIUM: 9.9 mg/dL (ref 8.4–10.5)
CHLORIDE: 102 meq/L (ref 96–112)
CO2: 24 meq/L (ref 19–32)
CREATININE: 0.94 mg/dL (ref 0.40–1.50)
GFR: 83.92 mL/min (ref >60.00)
GLUCOSE: 134 mg/dL — AB (ref 70–99)
Potassium: 4.3 mEq/L (ref 3.5–5.1)
SODIUM: 136 meq/L (ref 135–145)
Total Protein: 7.7 g/dL (ref 6.0–8.3)

## 2016-03-24 LAB — LIPID PANEL
CHOLESTEROL: 124 mg/dL (ref 0–200)
HDL: 25.6 mg/dL — ABNORMAL LOW (ref >39.00)
NonHDL: 98.28
Total CHOL/HDL Ratio: 5
Triglycerides: 389 mg/dL — ABNORMAL HIGH (ref 0.0–149.0)
VLDL: 77.8 mg/dL — AB (ref 0.0–40.0)

## 2016-03-24 LAB — LDL CHOLESTEROL, DIRECT: Direct LDL: 51 mg/dL

## 2016-03-24 LAB — PROTIME-INR
INR: 2.4 ratio — AB (ref 0.8–1.0)
Prothrombin Time: 26.3 s — ABNORMAL HIGH (ref 9.6–13.1)

## 2016-03-24 NOTE — Progress Notes (Signed)
Pre visit review using our clinic review tool, if applicable. No additional management support is needed unless otherwise documented below in the visit note. 

## 2016-03-24 NOTE — Patient Instructions (Signed)
Continue your current medications.  Follow up in 6 months or sooner if needed.  Take care  Dr. Lacinda Axon   Health Maintenance, Male A healthy lifestyle and preventative care can promote health and wellness.  Maintain regular health, dental, and eye exams.  Eat a healthy diet. Foods like vegetables, fruits, whole grains, low-fat dairy products, and lean protein foods contain the nutrients you need and are low in calories. Decrease your intake of foods high in solid fats, added sugars, and salt. Get information about a proper diet from your health care provider, if necessary.  Regular physical exercise is one of the most important things you can do for your health. Most adults should get at least 150 minutes of moderate-intensity exercise (any activity that increases your heart rate and causes you to sweat) each week. In addition, most adults need muscle-strengthening exercises on 2 or more days a week.   Maintain a healthy weight. The body mass index (BMI) is a screening tool to identify possible weight problems. It provides an estimate of body fat based on height and weight. Your health care provider can find your BMI and can help you achieve or maintain a healthy weight. For males 20 years and older:  A BMI below 18.5 is considered underweight.  A BMI of 18.5 to 24.9 is normal.  A BMI of 25 to 29.9 is considered overweight.  A BMI of 30 and above is considered obese.  Maintain normal blood lipids and cholesterol by exercising and minimizing your intake of saturated fat. Eat a balanced diet with plenty of fruits and vegetables. Blood tests for lipids and cholesterol should begin at age 54 and be repeated every 5 years. If your lipid or cholesterol levels are high, you are over age 57, or you are at high risk for heart disease, you may need your cholesterol levels checked more frequently.Ongoing high lipid and cholesterol levels should be treated with medicines if diet and exercise are not  working.  If you smoke, find out from your health care provider how to quit. If you do not use tobacco, do not start.  Lung cancer screening is recommended for adults aged 27-80 years who are at high risk for developing lung cancer because of a history of smoking. A yearly low-dose CT scan of the lungs is recommended for people who have at least a 30-pack-year history of smoking and are current smokers or have quit within the past 15 years. A pack year of smoking is smoking an average of 1 pack of cigarettes a day for 1 year (for example, a 30-pack-year history of smoking could mean smoking 1 pack a day for 30 years or 2 packs a day for 15 years). Yearly screening should continue until the smoker has stopped smoking for at least 15 years. Yearly screening should be stopped for people who develop a health problem that would prevent them from having lung cancer treatment.  If you choose to drink alcohol, do not have more than 2 drinks per day. One drink is considered to be 12 oz (360 mL) of beer, 5 oz (150 mL) of wine, or 1.5 oz (45 mL) of liquor.  Avoid the use of street drugs. Do not share needles with anyone. Ask for help if you need support or instructions about stopping the use of drugs.  High blood pressure causes heart disease and increases the risk of stroke. High blood pressure is more likely to develop in:  People who have blood pressure in the  end of the normal range (100-139/85-89 mm Hg).  People who are overweight or obese.  People who are African American.  If you are 70-73 years of age, have your blood pressure checked every 3-5 years. If you are 37 years of age or older, have your blood pressure checked every year. You should have your blood pressure measured twice--once when you are at a hospital or clinic, and once when you are not at a hospital or clinic. Record the average of the two measurements. To check your blood pressure when you are not at a hospital or clinic, you can  use:  An automated blood pressure machine at a pharmacy.  A home blood pressure monitor.  If you are 9-5 years old, ask your health care provider if you should take aspirin to prevent heart disease.  Diabetes screening involves taking a blood sample to check your fasting blood sugar level. This should be done once every 3 years after age 82 if you are at a normal weight and without risk factors for diabetes. Testing should be considered at a younger age or be carried out more frequently if you are overweight and have at least 1 risk factor for diabetes.  Colorectal cancer can be detected and often prevented. Most routine colorectal cancer screening begins at the age of 9 and continues through age 58. However, your health care provider may recommend screening at an earlier age if you have risk factors for colon cancer. On a yearly basis, your health care provider may provide home test kits to check for hidden blood in the stool. A small camera at the end of a tube may be used to directly examine the colon (sigmoidoscopy or colonoscopy) to detect the earliest forms of colorectal cancer. Talk to your health care provider about this at age 27 when routine screening begins. A direct exam of the colon should be repeated every 5-10 years through age 41, unless early forms of precancerous polyps or small growths are found.  People who are at an increased risk for hepatitis B should be screened for this virus. You are considered at high risk for hepatitis B if:  You were born in a country where hepatitis B occurs often. Talk with your health care provider about which countries are considered high risk.  Your parents were born in a high-risk country and you have not received a shot to protect against hepatitis B (hepatitis B vaccine).  You have HIV or AIDS.  You use needles to inject street drugs.  You live with, or have sex with, someone who has hepatitis B.  You are a man who has sex with other  men (MSM).  You get hemodialysis treatment.  You take certain medicines for conditions like cancer, organ transplantation, and autoimmune conditions.  Hepatitis C blood testing is recommended for all people born from [birth year] through 1967 and any individual with known risk factors for hepatitis C.  Healthy men should no longer receive prostate-specific antigen (PSA) blood tests as part of routine cancer screening. Talk to your health care provider about prostate cancer screening.  Testicular cancer screening is not recommended for adolescents or adult males who have no symptoms. Screening includes self-exam, a health care provider exam, and other screening tests. Consult with your health care provider about any symptoms you have or any concerns you have about testicular cancer.  Practice safe sex. Use condoms and avoid high-risk sexual practices to reduce the spread of sexually transmitted infections (STIs).  You should  be screened for STIs, including gonorrhea and chlamydia if:  You are sexually active and are younger than 24 years.  You are older than 24 years, and your health care provider tells you that you are at risk for this type of infection.  Your sexual activity has changed since you were last screened, and you are at an increased risk for chlamydia or gonorrhea. Ask your health care provider if you are at risk.  If you are at risk of being infected with HIV, it is recommended that you take a prescription medicine daily to prevent HIV infection. This is called pre-exposure prophylaxis (PrEP). You are considered at risk if:  You are a man who has sex with other men (MSM).  You are a heterosexual man who is sexually active with multiple partners.  You take drugs by injection.  You are sexually active with a partner who has HIV.  Talk with your health care provider about whether you are at high risk of being infected with HIV. If you choose to begin PrEP, you should first be tested  for HIV. You should then be tested every 3 months for as long as you are taking PrEP.  Use sunscreen. Apply sunscreen liberally and repeatedly throughout the day. You should seek shade when your shadow is shorter than you. Protect yourself by wearing long sleeves, pants, a wide-brimmed hat, and sunglasses year round whenever you are outdoors.  Tell your health care provider of new moles or changes in moles, especially if there is a change in shape or color. Also, tell your health care provider if a mole is larger than the size of a pencil eraser.  A one-time screening for abdominal aortic aneurysm (AAA) and surgical repair of large AAAs by ultrasound is recommended for men aged 19-75 years who are current or former smokers.  Stay current with your vaccines (immunizations).   This information is not intended to replace advice given to you by your health care provider. Make sure you discuss any questions you have with your health care provider.   Document Released: 02/26/2008 Document Revised: 09/20/2014 Document Reviewed: 01/25/2011 Elsevier Interactive Patient Education [employer].

## 2016-03-25 ENCOUNTER — Encounter: Payer: Self-pay | Admitting: Family Medicine

## 2016-03-25 DIAGNOSIS — M48061 Spinal stenosis, lumbar region without neurogenic claudication: Secondary | ICD-10-CM | POA: Insufficient documentation

## 2016-03-25 NOTE — Assessment & Plan Note (Signed)
Stable. On insulin, januvia, metformin. Followed by Endo.

## 2016-03-25 NOTE — Assessment & Plan Note (Signed)
BP elevated today but at goal (<150/90 per JNC 8). Continue Coreg, Hydralazine, Lasix, Norvasc.

## 2016-03-25 NOTE — Assessment & Plan Note (Signed)
Lipid panel today. Continue Pravastatin.

## 2016-03-25 NOTE — Assessment & Plan Note (Signed)
Stable.  Followed by cardiology. Compliant with Aspirin, Plavix, Hydralazine/Imdur, Coreg, Statin.

## 2016-03-25 NOTE — Assessment & Plan Note (Signed)
Stable. INR today.

## 2016-03-25 NOTE — Progress Notes (Signed)
Subjective:  Patient ID: Chase Cabrera, male    DOB: [birth year]  Age: 70 y.o. MRN: [id number]  CC: Establish care  HPI Chase Cabrera is a 70 y.o. male presents to the clinic today to establish care.   CAD  Extensive history. Has several stents and CABG.  Stable currently.  No recent chest pain or SOB.  Followed by Cardiology Dr. Melven Sartorius.  Currently on Aspirin, Plavix, Imdur/Hydralazine, Coreg, Statin (listed allergies to ACE and ARB's).  HTN  Has been stable.   BP elevated today (see below).  Compliant with Coreg, Hydralazine, Lasix, Norvasc.  Hyperlipidemia  Controlled except for elevated triglycerides.  LDL not available from recent lipid panel due to hypertriglyceridemia.   Lipid panel today.  Currently on Pravastatin.  Afib  S/p ablation in 2012.  Has been stable on Coreg.  Needs INR checked.  DM-2  Stable.  Most recent A1C 6.5.  Followed by Endo.  PMH, Surgical Hx, Family Hx, Social History reviewed and updated as below.  Past Medical History  Diagnosis Date  . CAD (coronary artery disease)   . Diabetes ([place])   . Hypertension   . CHF (congestive heart failure) ([place])   . Hyperlipemia   . Atrial fibrillation ([place])   . Spinal stenosis   . Renal artery stenosis ([place])   . Corneal dystrophy     bilateral  . Gout   . Chicken pox    Past Surgical History  Procedure Laterality Date  . Cardiac surgery    . Gallblader    . Cardiac catheterization    . Coronary angioplasty    . Coronary angioplasty with stent placement  03/28/2015    Distal 80% to normal Stent, Dilation Balloon  . Cardiac electrophysiology study and ablation    . Cardioversion    . Eye surgery      bilateral cataract  . Back surgery      lumbar fusion  . Total hip arthroplasty Left 11/11/2015    Procedure: TOTAL HIP ARTHROPLASTY ANTERIOR APPROACH;  Surgeon: Hessie Knows, MD;  Location: [facility] ORS;  Service: Orthopedics;  Laterality: Left;   Family History  Problem  Relation Age of Onset  . CVA Father   . Stroke Father   . Lung cancer Mother   . Arthritis Mother   . Stomach cancer Sister   . Colon cancer Sister   . Diabetes Brother    Social History  Substance Use Topics  . Smoking status: Former Research scientist (life sciences)  . Smokeless tobacco: Never Used  . Alcohol Use: No   Review of Systems  HENT: Positive for hearing loss.   Respiratory: Positive for cough.   Gastrointestinal: Positive for diarrhea.  Genitourinary: Positive for frequency.  Musculoskeletal: Positive for arthralgias.  All other systems reviewed and are negative.  Objective:   Today's Vitals: BP 150/64 mmHg  Pulse 59  Temp(Src) 97.8 F (36.6 C) (Oral)  Ht 5' 6.25" (1.683 m)  Wt 190 lb 4 oz (86.297 kg)  BMI 30.47 kg/m2  SpO2 94%  Physical Exam  Constitutional: He is oriented to person, place, and time. He appears well-developed. No distress.  HENT:  Head: Normocephalic and atraumatic.  Mouth/Throat: Oropharynx is clear and moist.  Eyes: Conjunctivae are normal. No scleral icterus.  Neck: Neck supple.  Cardiovascular: Normal rate and regular rhythm.   Pulmonary/Chest: Effort normal. He has no wheezes.  Abdominal: Soft. He exhibits no distension. There is no tenderness.  Musculoskeletal:  Decreased ROM of the hips and knees.  Neurological: He is alert and oriented to person, place, and time.  Skin: Skin is warm and dry.  Psychiatric: He has a normal mood and affect.  Vitals reviewed.  Assessment & Plan:   Problem List Items Addressed This Visit    Atrial fibrillation ([place])    Stable. INR today.      Relevant Orders   INR/PT (Completed)   CAD (coronary artery disease) - Primary    Stable.  Followed by cardiology. Compliant with Aspirin, Plavix, Hydralazine/Imdur, Coreg, Statin.       DM type 2 with diabetic peripheral neuropathy (HCC)    Stable. On insulin, januvia, metformin. Followed by Endo.      Hyperlipidemia    Lipid panel today. Continue Pravastatin.       Relevant Orders   Lipid Profile (Completed)   Direct LDL (Completed)   Essential hypertension    BP elevated today but at goal (<150/90 per JNC 8). Continue Coreg, Hydralazine, Lasix, Norvasc.      Relevant Orders   Comp Met (CMET) (Completed)      Outpatient Encounter Prescriptions as of 03/24/2016  Medication Sig  . amLODipine (NORVASC) 10 MG tablet Take 1 tablet by mouth daily.  Marland Kitchen aspirin EC 81 MG tablet Take 1 tablet by mouth daily.  . carvedilol (COREG) 25 MG tablet Take 25 mg by mouth 2 (two) times daily with a meal.   . clopidogrel (PLAVIX) 75 MG tablet Take 75 mg by mouth daily.  . colchicine 0.6 MG tablet Take 1 tablet by mouth daily.  . diclofenac sodium (VOLTAREN) 1 % GEL Apply 2 g topically 4 (four) times daily.  Marland Kitchen dicyclomine (BENTYL) 10 MG capsule Take 10 mg by mouth 4 (four) times daily.  . furosemide (LASIX) 20 MG tablet Take 20 mg by mouth daily.  Marland Kitchen gabapentin (NEURONTIN) 300 MG capsule Take 1 capsule by mouth 3 (three) times daily.  Marland Kitchen HUMALOG MIX 75/25 KWIKPEN (75-25) 100 UNIT/ML Kwikpen Inject 30-44 Units into the skin 2 (two) times daily. 30 units every morning and 44 units in the evening.  . hydrALAZINE (APRESOLINE) 100 MG tablet Take 1 tablet by mouth 3 (three) times daily.  . isosorbide mononitrate (IMDUR) 120 MG 24 hr tablet Take 1 tablet by mouth daily.  Marland Kitchen JANUVIA 100 MG tablet Take 1 tablet by mouth daily.  Marland Kitchen LYRICA 75 MG capsule Take 75 mg by mouth 2 (two) times daily.  . metFORMIN (GLUCOPHAGE) 1000 MG tablet Take 1 tablet by mouth 2 (two) times daily.  . Multiple Vitamin (MULTI-VITAMINS) TABS Take 1 tablet by mouth daily.  . nitroGLYCERIN (NITROSTAT) 0.4 MG SL tablet Place 0.4 mg under the tongue every 5 (five) minutes as needed for chest pain.  . Omega-3 Fatty Acids (FISH OIL) 1000 MG CAPS Take 1 capsule by mouth at bedtime.  Marland Kitchen omeprazole (PRILOSEC) 40 MG capsule Take 1 capsule by mouth 2 (two) times daily.  . potassium chloride (K-DUR,KLOR-CON) 10  MEQ tablet Take 10 mEq by mouth 2 (two) times daily.   . pravastatin (PRAVACHOL) 40 MG tablet Take 40 mg by mouth at bedtime.   . probenecid (BENEMID) 500 MG tablet Take 1 tablet by mouth 2 (two) times daily.  . tamsulosin (FLOMAX) 0.4 MG CAPS capsule Take 1 capsule by mouth at bedtime.   . vitamin C (ASCORBIC ACID) 500 MG tablet Take 1,000 mg by mouth daily.  . Vitamin E 400 units TABS Take 400 Units by mouth 2 (two) times daily.  Marland Kitchen warfarin (COUMADIN)  4 MG tablet Take 4 mg by mouth daily at 6 PM.  . [DISCONTINUED] HYDROcodone-acetaminophen (NORCO) 10-325 MG tablet Take 1 tablet by mouth every 6 (six) hours as needed.  . nitroGLYCERIN (NITROSTAT) 0.4 MG SL tablet Place 1 tablet under the tongue every 5 (five) minutes x 3 doses as needed. For chest pain.  Call 911 if no relief after 3 tablets   No facility-administered encounter medications on file as of 03/24/2016.    Follow-up: 6 months.  [place]

## 2016-04-06 ENCOUNTER — Telehealth: Payer: Self-pay | Admitting: Family Medicine

## 2016-04-06 NOTE — Telephone Encounter (Signed)
Form was not received, thanks

## 2016-04-06 NOTE — Telephone Encounter (Signed)
Christina [phone] and pt wife called from [employer] wheelchair. Pt is scheduled to come in tomorrow for a face to face mobility assessment. Form was faxed today by Margreta Journey from [employer]. Thank you!

## 2016-04-06 NOTE — Telephone Encounter (Signed)
Talked with Chase Cabrera at [employer], she is refaxing the form. Please let me know if it is received, thanks

## 2016-04-06 NOTE — Telephone Encounter (Signed)
[place]. See number below. :)

## 2016-04-06 NOTE — Telephone Encounter (Signed)
I have not received anything in Dr.Cook's mailbox.

## 2016-04-06 NOTE — Telephone Encounter (Signed)
Please advise if this form was received. thanks

## 2016-04-07 ENCOUNTER — Ambulatory Visit (INDEPENDENT_AMBULATORY_CARE_PROVIDER_SITE_OTHER): Payer: Medicare Other | Admitting: Family Medicine

## 2016-04-07 ENCOUNTER — Encounter: Payer: Self-pay | Admitting: Family Medicine

## 2016-04-07 ENCOUNTER — Encounter (INDEPENDENT_AMBULATORY_CARE_PROVIDER_SITE_OTHER): Payer: Self-pay

## 2016-04-07 DIAGNOSIS — I2510 Atherosclerotic heart disease of native coronary artery without angina pectoris: Secondary | ICD-10-CM | POA: Diagnosis not present

## 2016-04-07 DIAGNOSIS — Z7409 Other reduced mobility: Secondary | ICD-10-CM

## 2016-04-07 NOTE — Telephone Encounter (Signed)
Called company they are re-faxing for the third time to [phone]

## 2016-04-07 NOTE — Patient Instructions (Signed)
We will call when we have sent it in.  INR at the end of August.  Take care  Dr. Lacinda Axon

## 2016-04-07 NOTE — Telephone Encounter (Signed)
Paperwork received and given to provider to fill out.

## 2016-04-08 ENCOUNTER — Encounter: Payer: Self-pay | Admitting: *Deleted

## 2016-04-08 DIAGNOSIS — Z7409 Other reduced mobility: Secondary | ICD-10-CM | POA: Insufficient documentation

## 2016-04-08 DIAGNOSIS — M1990 Unspecified osteoarthritis, unspecified site: Secondary | ICD-10-CM | POA: Insufficient documentation

## 2016-04-08 NOTE — Progress Notes (Addendum)
Subjective:  Patient ID: Chase Cabrera, male    DOB: 24-May-1946  Age: 70 y.o. MRN: [id number]  CC: Mobility assessment  HPI:  12 -year-old male with a complicated past medical history including atrial fibrillation, CAD, DM 2 with neuropathy, OA, lumbar stenosis, HTN, HLD presents for a mobility assessment.   Patient presents for mobility assessment. He already has a power wheelchair and this is essential a recertification. He desires a new power wheelchair as the current one's battery is dying.  Patient has several medical conditions that impact his mobility - DM-2 with neuropathy, OA s/p hip replacement, Lumbar stenosis (s/p back surgery)/chronic back pain. The PMD is needed to aid with getting to the kitchen, restroom and other areas of his home. Patient cannot use a cane to meet these needs as he has poor balance due to neuropathy. Manual wheelchair will not meet these needs either as he is unable to operate it effectively due to arthritis (cannot propel wheelchair effectively due to pain in shoulder, hands). He cannot use it quickly enough to get to the restroom on time. He cannot use a scooter/POV to fulfill his needs as he cannot easily operate the tiller. Patient can successfully operate the power mobility device (mentally and physically) and is willing and motivated to use the device.  Social Hx   Social History   Social History  . Marital status: Married    Spouse name: N/A  . Number of children: N/A  . Years of education: N/A   Social History Main Topics  . Smoking status: Former Research scientist (life sciences)  . Smokeless tobacco: Never Used  . Alcohol use No  . Drug use: No  . Sexual activity: Not Asked   Other Topics Concern  . None   Social History Narrative  . None   Review of Systems  Constitutional: Negative.   Respiratory: Negative.   Cardiovascular: Negative.   Musculoskeletal:       Difficulty ambulating.    Objective:  BP 128/60 (BP Location: Left Arm, Patient Position:  Sitting, Cuff Size: Normal)   Pulse 64   Temp 98.1 F (36.7 C) (Oral)   Wt 193 lb (87.5 kg)   SpO2 93%   BMI 30.92 kg/m   BP/Weight 04/07/2016 5/78/4696 10/22/5282  Systolic BP 132 440 102  Diastolic BP 60 64 55  Wt. (Lbs) 193 190.25 -  BMI 30.92 30.47 -   Physical Exam  Constitutional: He is oriented to person, place, and time. He appears well-developed. No distress.  Cardiovascular: Normal rate and regular rhythm.   Pulmonary/Chest: Effort normal. He has no wheezes. He has no rales.  Musculoskeletal:  Upper extremity strength - 4/5. Lower extremity strength - 4/5. Shuffling gait. Mild decreased range of motion of the left knee and left hip. Markedly decreased range of motion of the back. Patient currently states that he is in no pain. When this worsens it is 10/10 in severity.  Neurological: He is alert and oriented to person, place, and time.  Psychiatric: He has a normal mood and affect.  Vitals reviewed.  Lab Results  Component Value Date   WBC 5.3 11/18/2015   HGB 10.6 (L) 11/18/2015   HCT 30.0 (L) 11/18/2015   PLT 226 11/18/2015   GLUCOSE 134 (H) 03/24/2016   CHOL 124 03/24/2016   TRIG 389.0 (H) 03/24/2016   HDL 25.60 (L) 03/24/2016   LDLDIRECT 51.0 03/24/2016   ALT 27 03/24/2016   AST 23 03/24/2016   NA 136 03/24/2016   K  4.3 03/24/2016   CL 102 03/24/2016   CREATININE 0.94 03/24/2016   BUN 18 03/24/2016   CO2 24 03/24/2016   INR 2.4 (H) 03/24/2016    Assessment & Plan:   Problem List Items Addressed This Visit    Encounter related to need for assistance due to reduced mobility    Patient presented for mobility assessment related to his power mobility device.  Assessment was done today and appropriate documentation was done per requirements in the history of present illness. Patient appears to have a need for a power chair given his comorbidities. Will send this to [employer].       Other Visit Diagnoses   None.    Follow-up: August for INR  15   minutes were spent face-to-face with the patient during this encounter and all of that time was spent discussing his limitations and needs for a PMD device as well as the physical assessment.   [place]

## 2016-04-08 NOTE — Assessment & Plan Note (Signed)
Patient presented for mobility assessment related to his power mobility device.  Assessment was done today and appropriate documentation was done per requirements in the history of present illness. Patient appears to have a need for a power chair given his comorbidities. Will send this to [employer].

## 2016-04-09 ENCOUNTER — Ambulatory Visit
Admission: RE | Admit: 2016-04-09 | Discharge: 2016-04-09 | Disposition: A | Discharge status: Home / Self Care | Payer: Medicare Other | Source: Ambulatory Visit | Attending: Gastroenterology | Admitting: Gastroenterology

## 2016-04-09 ENCOUNTER — Ambulatory Visit: Payer: Medicare Other | Admitting: Anesthesiology

## 2016-04-09 ENCOUNTER — Encounter: Payer: Self-pay | Admitting: *Deleted

## 2016-04-09 ENCOUNTER — Encounter
Admission: RE | Disposition: A | Discharge status: Home / Self Care | Payer: Self-pay | Source: Ambulatory Visit | Attending: Gastroenterology

## 2016-04-09 DIAGNOSIS — K648 Other hemorrhoids: Secondary | ICD-10-CM | POA: Diagnosis not present

## 2016-04-09 DIAGNOSIS — E785 Hyperlipidemia, unspecified: Secondary | ICD-10-CM | POA: Insufficient documentation

## 2016-04-09 DIAGNOSIS — K529 Noninfective gastroenteritis and colitis, unspecified: Secondary | ICD-10-CM | POA: Insufficient documentation

## 2016-04-09 DIAGNOSIS — K5790 Diverticulosis of intestine, part unspecified, without perforation or abscess without bleeding: Secondary | ICD-10-CM | POA: Diagnosis not present

## 2016-04-09 DIAGNOSIS — R197 Diarrhea, unspecified: Secondary | ICD-10-CM | POA: Diagnosis not present

## 2016-04-09 DIAGNOSIS — I110 Hypertensive heart disease with heart failure: Secondary | ICD-10-CM | POA: Insufficient documentation

## 2016-04-09 DIAGNOSIS — Z7982 Long term (current) use of aspirin: Secondary | ICD-10-CM | POA: Diagnosis not present

## 2016-04-09 DIAGNOSIS — E1142 Type 2 diabetes mellitus with diabetic polyneuropathy: Secondary | ICD-10-CM | POA: Diagnosis not present

## 2016-04-09 DIAGNOSIS — M4800 Spinal stenosis, site unspecified: Secondary | ICD-10-CM | POA: Insufficient documentation

## 2016-04-09 DIAGNOSIS — K5289 Other specified noninfective gastroenteritis and colitis: Secondary | ICD-10-CM | POA: Diagnosis not present

## 2016-04-09 DIAGNOSIS — I4891 Unspecified atrial fibrillation: Secondary | ICD-10-CM | POA: Diagnosis not present

## 2016-04-09 DIAGNOSIS — K640 First degree hemorrhoids: Secondary | ICD-10-CM | POA: Insufficient documentation

## 2016-04-09 DIAGNOSIS — M1990 Unspecified osteoarthritis, unspecified site: Secondary | ICD-10-CM | POA: Insufficient documentation

## 2016-04-09 DIAGNOSIS — K5730 Diverticulosis of large intestine without perforation or abscess without bleeding: Secondary | ICD-10-CM | POA: Diagnosis not present

## 2016-04-09 DIAGNOSIS — Z7901 Long term (current) use of anticoagulants: Secondary | ICD-10-CM | POA: Diagnosis not present

## 2016-04-09 DIAGNOSIS — M109 Gout, unspecified: Secondary | ICD-10-CM | POA: Diagnosis not present

## 2016-04-09 DIAGNOSIS — I2510 Atherosclerotic heart disease of native coronary artery without angina pectoris: Secondary | ICD-10-CM | POA: Insufficient documentation

## 2016-04-09 DIAGNOSIS — I252 Old myocardial infarction: Secondary | ICD-10-CM | POA: Insufficient documentation

## 2016-04-09 DIAGNOSIS — Z79899 Other long term (current) drug therapy: Secondary | ICD-10-CM | POA: Diagnosis not present

## 2016-04-09 DIAGNOSIS — Z7984 Long term (current) use of oral hypoglycemic drugs: Secondary | ICD-10-CM | POA: Diagnosis not present

## 2016-04-09 DIAGNOSIS — I509 Heart failure, unspecified: Secondary | ICD-10-CM | POA: Insufficient documentation

## 2016-04-09 DIAGNOSIS — R194 Change in bowel habit: Secondary | ICD-10-CM | POA: Diagnosis not present

## 2016-04-09 HISTORY — DX: Polyneuropathy, unspecified: G62.9

## 2016-04-09 HISTORY — PX: COLONOSCOPY WITH PROPOFOL: SHX5780

## 2016-04-09 LAB — PROTIME-INR
INR: 1.13
PROTHROMBIN TIME: 14.6 s (ref 11.4–15.2)

## 2016-04-09 LAB — CBC
HEMATOCRIT: 39.7 % — AB (ref 40.0–52.0)
Hemoglobin: 13.8 g/dL (ref 13.0–18.0)
MCH: 31.2 pg (ref 26.0–34.0)
MCHC: 34.8 g/dL (ref 32.0–36.0)
MCV: 89.8 fL (ref 80.0–100.0)
PLATELETS: 279 10*3/uL (ref 150–440)
RBC: 4.43 MIL/uL (ref 4.40–5.90)
RDW: 16.7 % — ABNORMAL HIGH (ref 11.5–14.5)
WBC: 7.5 10*3/uL (ref 3.8–10.6)

## 2016-04-09 LAB — HM COLONOSCOPY

## 2016-04-09 LAB — GLUCOSE, CAPILLARY: Glucose-Capillary: 154 mg/dL — ABNORMAL HIGH (ref 65–99)

## 2016-04-09 SURGERY — COLONOSCOPY WITH PROPOFOL
Anesthesia: General

## 2016-04-09 MED ORDER — PROPOFOL 500 MG/50ML IV EMUL
INTRAVENOUS | Status: DC | PRN
  Administered 2016-04-09: 100 ug/kg/min via INTRAVENOUS

## 2016-04-09 MED ORDER — PHENYLEPHRINE HCL 10 MG/ML IJ SOLN
INTRAMUSCULAR | Status: DC | PRN
  Administered 2016-04-09: 100 ug via INTRAVENOUS

## 2016-04-09 MED ORDER — PROPOFOL 10 MG/ML IV BOLUS
INTRAVENOUS | Status: DC | PRN
  Administered 2016-04-09: 90 mg via INTRAVENOUS

## 2016-04-09 MED ORDER — SODIUM CHLORIDE 0.9 % IV SOLN
INTRAVENOUS | Status: DC
  Administered 2016-04-09: 10:00:00 via INTRAVENOUS

## 2016-04-09 MED ORDER — SODIUM CHLORIDE 0.9 % IV SOLN
2.0000 g | Freq: Once | INTRAVENOUS | Status: AC
  Administered 2016-04-09: 2 g via INTRAVENOUS
  Filled 2016-04-09: qty 2000

## 2016-04-09 MED ORDER — SODIUM CHLORIDE 0.9 % IV SOLN: INTRAVENOUS | Status: DC

## 2016-04-09 MED ORDER — GLYCOPYRROLATE 0.2 MG/ML IJ SOLN
INTRAMUSCULAR | Status: DC | PRN
  Administered 2016-04-09: 0.2 mg via INTRAVENOUS

## 2016-04-09 NOTE — Transfer of Care (Signed)
Immediate Anesthesia Transfer of Care Note  Patient: Chase Cabrera  Procedure(s) Performed: Procedure(s): COLONOSCOPY WITH PROPOFOL (N/A)  Patient Location: PACU and Endoscopy Unit  Anesthesia Type:General  Level of Consciousness: patient cooperative and lethargic  Airway & Oxygen Therapy: Patient Spontanous Breathing  Post-op Assessment: Report given to RN and Post -op Vital signs reviewed and stable  Post vital signs: Reviewed and stable  Last Vitals:  Vitals:   04/09/16 0827 04/09/16 1018  BP: (!) 115/58 (!) 100/42  Pulse: 63 (!) 58  Resp: 18 (!) 22  Temp: 36.9 C (!) 35.8 C    Last Pain:  Vitals:   04/09/16 0827  TempSrc: Tympanic         Complications: No apparent anesthesia complications

## 2016-04-09 NOTE — H&P (Signed)
Outpatient short stay form Pre-procedure 04/09/2016 9:18 AM Lollie Sails MD  Primary Physician: Dr. Frazier Richards  Reason for visit:  Colonoscopy  History of present illness:  Patient is a 70 year old male presenting today as above. He has a personal history of chronic diarrhea going on for at least 2 years to 3 years. He has had biopsy several years ago that indicated lymphocytic colitis. He tolerated his prep well. Does take warfarin and Plavix both of which she stopped 6 days ago. Also takes an 81 mg aspirin which he held today. He takes no other aspirin products. He does take colchicine daily for issues of gout. He is unable to take allopurinol.    Current Facility-Administered Medications:  .  0.9 %  sodium chloride infusion, , Intravenous, Continuous, Lollie Sails, MD .  0.9 %  sodium chloride infusion, , Intravenous, Continuous, Lollie Sails, MD .  ampicillin (OMNIPEN) 2 g in sodium chloride 0.9 % 50 mL IVPB, 2 g, Intravenous, Once, Lollie Sails, MD  Prescriptions Prior to Admission  Medication Sig Dispense Refill Last Dose  . amLODipine (NORVASC) 10 MG tablet Take 1 tablet by mouth daily.   04/08/2016 at Unknown time  . aspirin EC 81 MG tablet Take 1 tablet by mouth daily.   04/08/2016 at Unknown time  . carvedilol (COREG) 25 MG tablet Take 25 mg by mouth 2 (two) times daily with a meal.   1 04/08/2016 at 2100  . colchicine 0.6 MG tablet Take 1 tablet by mouth daily.  0 04/08/2016 at Unknown time  . dicyclomine (BENTYL) 10 MG capsule Take 10 mg by mouth 4 (four) times daily.   04/08/2016 at 2100  . furosemide (LASIX) 20 MG tablet Take 20 mg by mouth daily.   04/08/2016 at Unknown time  . gabapentin (NEURONTIN) 300 MG capsule Take 1 capsule by mouth 3 (three) times daily.   04/08/2016 at 2100  . HUMALOG MIX 75/25 KWIKPEN (75-25) 100 UNIT/ML Kwikpen Inject 30-44 Units into the skin 2 (two) times daily. 30 units every morning and 44 units in the evening.  0 04/08/2016 at  2100  . hydrALAZINE (APRESOLINE) 100 MG tablet Take 1 tablet by mouth 3 (three) times daily.   04/09/2016 at 0615  . HYDROcodone-acetaminophen (NORCO) 10-325 MG tablet Take 1 tablet by mouth every 6 (six) hours as needed (pain).     . isosorbide mononitrate (IMDUR) 120 MG 24 hr tablet Take 1 tablet by mouth daily.   04/09/2016 at 0615  . JANUVIA 100 MG tablet Take 1 tablet by mouth daily.   04/08/2016 at Unknown time  . LYRICA 75 MG capsule Take 75 mg by mouth 2 (two) times daily.  0 04/08/2016 at 2100  . Multiple Vitamin (MULTI-VITAMINS) TABS Take 1 tablet by mouth daily.   Past Week at Unknown time  . Omega-3 Fatty Acids (FISH OIL) 1000 MG CAPS Take 1 capsule by mouth at bedtime.   Past Week at Unknown time  . omeprazole (PRILOSEC) 40 MG capsule Take 1 capsule by mouth 2 (two) times daily.   04/09/2016 at 0615  . oxycodone (OXY-IR) 5 MG capsule Take 5 mg by mouth.     . potassium chloride (K-DUR,KLOR-CON) 10 MEQ tablet Take 10 mEq by mouth 2 (two) times daily.    04/08/2016 at Unknown time  . pravastatin (PRAVACHOL) 40 MG tablet Take 40 mg by mouth at bedtime.   0 04/08/2016 at 2100  . probenecid (BENEMID) 500 MG tablet Take 1  tablet by mouth 2 (two) times daily.  0 04/08/2016 at 2100  . tamsulosin (FLOMAX) 0.4 MG CAPS capsule Take 1 capsule by mouth at bedtime.    04/08/2016 at 2100  . vitamin C (ASCORBIC ACID) 500 MG tablet Take 1,000 mg by mouth daily.   Past Week at Unknown time  . Vitamin E 400 units TABS Take 400 Units by mouth 2 (two) times daily.   Past Week at Unknown time  . clopidogrel (PLAVIX) 75 MG tablet Take 75 mg by mouth daily.   Taking  . diclofenac sodium (VOLTAREN) 1 % GEL Apply 2 g topically 4 (four) times daily.   Taking  . metFORMIN (GLUCOPHAGE) 1000 MG tablet Take 1 tablet by mouth 2 (two) times daily.  0 04/07/2016  . nitroGLYCERIN (NITROSTAT) 0.4 MG SL tablet Place 1 tablet under the tongue every 5 (five) minutes x 3 doses as needed. For chest pain.  Call 911 if no relief after 3  tablets     . nitroGLYCERIN (NITROSTAT) 0.4 MG SL tablet Place 0.4 mg under the tongue every 5 (five) minutes as needed for chest pain.   Taking  . pantoprazole (PROTONIX) 40 MG tablet Take 40 mg by mouth daily.     Marland Kitchen warfarin (COUMADIN) 4 MG tablet Take 4 mg by mouth daily at 6 PM.   04/03/2016     Allergies  Allergen Reactions  . Allopurinol Diarrhea, Other (See Comments) and Nausea And Vomiting    Other Reaction: GI Upset  . Atenolol Other (See Comments)    Other Reaction: bradycardia  . Atorvastatin Other (See Comments)    Other Reaction: muscle aches  . Ramipril Rash  . Simvastatin Other (See Comments)    Other Reaction: MUSCLE ACHES (Zocor)  . Valsartan Other (See Comments)    Other Reaction: facial swelling  . Rosuvastatin Other (See Comments)    Muscle aches  . Cardizem [Diltiazem] Rash     Past Medical History:  Diagnosis Date  . Arthritis   . Atrial fibrillation ([place])   . CAD (coronary artery disease)   . CHF (congestive heart failure) ([place])   . Chicken pox   . Corneal dystrophy    bilateral  . Diabetes ([place])   . Gout   . Hyperlipemia   . Hypertension   . Myocardial infarction (HCC)    x4  . Peripheral neuropathy ([place])   . Peripheral neuropathy ([place])   . Renal artery stenosis ([place])   . Spinal stenosis     Review of systems:      Physical Exam    Heart and lungs: Regular rate and rhythm without rub or gallop, lungs are bilaterally clear.    HEENT: Normocephalic atraumatic eyes are anicteric    Other:     Pertinant exam for procedure: Soft nontender nondistended bowel sounds positive normoactive.    Planned proceedures: Colonoscopy and indicated procedures. I have discussed the risks benefits and complications of procedures to include not limited to bleeding, infection, perforation and the risk of sedation and the patient wishes to proceed.    Lollie Sails, MD Gastroenterology 04/09/2016  9:18 AM

## 2016-04-09 NOTE — Anesthesia Preprocedure Evaluation (Signed)
Anesthesia Evaluation  Patient identified by MRN, date of birth, ID band Patient awake    Reviewed: Allergy & Precautions, H&P , NPO status , Patient's Chart, lab work & pertinent test results, reviewed documented beta blocker date and time   Airway Mallampati: II   Neck ROM: full    Dental  (+) Poor Dentition   Pulmonary neg pulmonary ROS, former smoker,    Pulmonary exam normal        Cardiovascular hypertension, + CAD, + Past MI, + Peripheral Vascular Disease and +CHF  negative cardio ROS Normal cardiovascular exam Rate:Normal     Neuro/Psych  Neuromuscular disease negative neurological ROS  negative psych ROS   GI/Hepatic negative GI ROS, Neg liver ROS,   Endo/Other  negative endocrine ROSdiabetes  Renal/GU CRFRenal diseasenegative Renal ROS  negative genitourinary   Musculoskeletal   Abdominal   Peds  Hematology negative hematology ROS (+)   Anesthesia Other Findings Past Medical History: No date: Arthritis No date: Atrial fibrillation (HCC) No date: CAD (coronary artery disease) No date: CHF (congestive heart failure) (HCC) No date: Chicken pox No date: Corneal dystrophy     Comment: bilateral No date: Diabetes ([place]) No date: Gout No date: Hyperlipemia No date: Hypertension No date: Myocardial infarction ([place])     Comment: x4 No date: Peripheral neuropathy (HCC) No date: Peripheral neuropathy (HCC) No date: Renal artery stenosis (HCC) No date: Spinal stenosis Past Surgical History: No date: BACK SURGERY     Comment: lumbar fusion No date: CARDIAC CATHETERIZATION No date: CARDIAC ELECTROPHYSIOLOGY STUDY AND ABLATION No date: CARDIAC SURGERY No date: CARDIOVERSION No date: CORONARY ANGIOPLASTY 03/28/2015: CORONARY ANGIOPLASTY WITH STENT PLACEMENT     Comment: Distal 80% to normal Stent, Dilation Balloon No date: EYE SURGERY     Comment: bilateral cataract 2005: foot and ankle repair Right No  date: GALLBLADER No date: KNEE ARTHROSCOPY Right No date: RENAL ARTERY STENT Right 2006: ROTATOR CUFF REPAIR Right No date: TONSILLECTOMY 11/11/2015: TOTAL HIP ARTHROPLASTY Left     Comment: Procedure: TOTAL HIP ARTHROPLASTY ANTERIOR               APPROACH;  Surgeon: Hessie Knows, MD;                Location: [facility] ORS;  Service: Orthopedics;                Laterality: Left; BMI    Body Mass Index:  30.92 kg/m     Reproductive/Obstetrics                             Anesthesia Physical Anesthesia Plan  ASA: IV  Anesthesia Plan: General   Post-op Pain Management:    Induction:   Airway Management Planned:   Additional Equipment:   Intra-op Plan:   Post-operative Plan:   Informed Consent: I have reviewed the patients History and Physical, chart, labs and discussed the procedure including the risks, benefits and alternatives for the proposed anesthesia with the patient or authorized representative who has indicated his/her understanding and acceptance.   Dental Advisory Given  Plan Discussed with: CRNA  Anesthesia Plan Comments:         Anesthesia Quick Evaluation

## 2016-04-09 NOTE — Op Note (Signed)
[facility] Patient Name: Chase Cabrera Procedure Date: 04/09/2016 9:47 AM MRN: [id number] Account #: [id number] Date of Birth: 03-17-1946 Admit Type: Outpatient Age: 70 Room: [facility] ENDO ROOM 4 Gender: Male Note Status: Finalized Procedure:            Colonoscopy Indications:          Chronic diarrhea, Change in bowel habits Providers:            Lollie Sails, MD Referring MD:         Barnie Del. Lacinda Axon (Referring MD) Medicines:            Monitored Anesthesia Care Complications:        No immediate complications. Procedure:            Pre-Anesthesia Assessment:                       - ASA Grade Assessment: III - A patient with severe                        systemic disease.                       After obtaining informed consent, the colonoscope was                        passed under direct vision. Throughout the procedure,                        the patient's blood pressure, pulse, and oxygen                        saturations were monitored continuously. The                        Colonoscope was introduced through the anus and                        advanced to the the cecum, identified by appendiceal                        orifice and ileocecal valve. The colonoscopy was                        performed without difficulty. The patient tolerated the                        procedure well. The quality of the bowel preparation                        was fair. Findings:      Multiple small-mouthed diverticula were found in the sigmoid colon and       descending colon. The IC valve was turned open and found to be normal.      Biopsies for histology were taken with a cold forceps from the right       colon and left colon for evaluation of microscopic colitis.      Non-bleeding internal hemorrhoids were found during retroflexion. The       hemorrhoids were small and Grade I (internal hemorrhoids that do not       prolapse).      The digital  rectal exam was normal. Impression:           - Preparation of the colon was fair.                       - Diverticulosis in the sigmoid colon and in the                        descending colon.                       - Non-bleeding internal hemorrhoids.                       - Biopsies were taken with a cold forceps from the                        right colon and left colon for evaluation of                        microscopic colitis. Recommendation:       - Discharge patient to home.                       - Await pathology results.                       - Return to GI clinic in 3 weeks. Procedure Code(s):    --- Professional ---                       [phone], Colonoscopy, flexible; with biopsy, single or                        multiple Diagnosis Code(s):    --- Professional ---                       K64.0, First degree hemorrhoids                       K52.9, Noninfective gastroenteritis and colitis,                        unspecified                       R19.4, Change in bowel habit                       K57.30, Diverticulosis of large intestine without                        perforation or abscess without bleeding CPT copyright 2016 American Medical Association. All rights reserved. The codes documented in this report are preliminary and upon coder review may  be revised to meet current compliance requirements. Lollie Sails, MD 04/09/2016 10:18:02 AM This report has been signed electronically. Number of Addenda: 0 Note Initiated On: 04/09/2016 9:47 AM Scope Withdrawal Time: 0 hours 13 minutes 26 seconds  Total Procedure Duration: 0 hours 19 minutes 1 second       [facility]

## 2016-04-10 NOTE — Anesthesia Postprocedure Evaluation (Signed)
Anesthesia Post Note  Patient: Chase Cabrera  Procedure(s) Performed: Procedure(s) (LRB): COLONOSCOPY WITH PROPOFOL (N/A)  Patient location during evaluation: PACU Anesthesia Type: General Level of consciousness: awake and alert Pain management: pain level controlled Vital Signs Assessment: post-procedure vital signs reviewed and stable Respiratory status: spontaneous breathing, nonlabored ventilation, respiratory function stable and patient connected to nasal cannula oxygen Cardiovascular status: blood pressure returned to baseline and stable Postop Assessment: no signs of nausea or vomiting Anesthetic complications: no    Last Vitals:  Vitals:   04/09/16 1030 04/09/16 1050  BP: (!) 110/45 (!) 144/71  Pulse:    Resp:    Temp:      Last Pain:  Vitals:   04/09/16 1021  TempSrc: Tympanic                 Molli Barrows

## 2016-04-12 ENCOUNTER — Encounter: Payer: Self-pay | Admitting: Gastroenterology

## 2016-04-12 LAB — SURGICAL PATHOLOGY

## 2016-04-18 ENCOUNTER — Other Ambulatory Visit: Payer: Self-pay | Admitting: Family Medicine

## 2016-04-19 DIAGNOSIS — I10 Essential (primary) hypertension: Secondary | ICD-10-CM | POA: Diagnosis not present

## 2016-04-19 DIAGNOSIS — G4730 Sleep apnea, unspecified: Secondary | ICD-10-CM | POA: Diagnosis not present

## 2016-04-19 DIAGNOSIS — I2510 Atherosclerotic heart disease of native coronary artery without angina pectoris: Secondary | ICD-10-CM | POA: Diagnosis not present

## 2016-04-19 DIAGNOSIS — E782 Mixed hyperlipidemia: Secondary | ICD-10-CM | POA: Diagnosis not present

## 2016-04-19 DIAGNOSIS — R0683 Snoring: Secondary | ICD-10-CM | POA: Diagnosis not present

## 2016-04-19 NOTE — Telephone Encounter (Signed)
Historical medication. patient last seen 04/07/16. Please advise?

## 2016-04-21 DIAGNOSIS — E1142 Type 2 diabetes mellitus with diabetic polyneuropathy: Secondary | ICD-10-CM | POA: Diagnosis not present

## 2016-04-21 DIAGNOSIS — Z7901 Long term (current) use of anticoagulants: Secondary | ICD-10-CM | POA: Diagnosis not present

## 2016-04-21 DIAGNOSIS — E782 Mixed hyperlipidemia: Secondary | ICD-10-CM | POA: Diagnosis not present

## 2016-04-23 ENCOUNTER — Other Ambulatory Visit: Payer: Self-pay

## 2016-04-23 DIAGNOSIS — Z7901 Long term (current) use of anticoagulants: Secondary | ICD-10-CM

## 2016-04-26 ENCOUNTER — Other Ambulatory Visit: Payer: Medicare Other

## 2016-05-03 DIAGNOSIS — E1142 Type 2 diabetes mellitus with diabetic polyneuropathy: Secondary | ICD-10-CM | POA: Diagnosis not present

## 2016-05-03 DIAGNOSIS — E1165 Type 2 diabetes mellitus with hyperglycemia: Secondary | ICD-10-CM | POA: Diagnosis not present

## 2016-05-03 DIAGNOSIS — Z794 Long term (current) use of insulin: Secondary | ICD-10-CM | POA: Diagnosis not present

## 2016-05-03 DIAGNOSIS — E782 Mixed hyperlipidemia: Secondary | ICD-10-CM | POA: Diagnosis not present

## 2016-05-06 ENCOUNTER — Ambulatory Visit: Payer: Medicare Other | Attending: Internal Medicine

## 2016-05-07 ENCOUNTER — Other Ambulatory Visit (INDEPENDENT_AMBULATORY_CARE_PROVIDER_SITE_OTHER): Payer: Medicare Other

## 2016-05-07 DIAGNOSIS — Z7901 Long term (current) use of anticoagulants: Secondary | ICD-10-CM | POA: Diagnosis not present

## 2016-05-07 LAB — PROTIME-INR
INR: 2 ratio — ABNORMAL HIGH (ref 0.8–1.0)
Prothrombin Time: 21.3 s — ABNORMAL HIGH (ref 9.6–13.1)

## 2016-05-10 ENCOUNTER — Other Ambulatory Visit: Payer: Medicare Other

## 2016-05-14 DIAGNOSIS — E1142 Type 2 diabetes mellitus with diabetic polyneuropathy: Secondary | ICD-10-CM | POA: Diagnosis not present

## 2016-05-14 DIAGNOSIS — E1165 Type 2 diabetes mellitus with hyperglycemia: Secondary | ICD-10-CM | POA: Diagnosis not present

## 2016-05-14 DIAGNOSIS — Z794 Long term (current) use of insulin: Secondary | ICD-10-CM | POA: Diagnosis not present

## 2016-05-24 ENCOUNTER — Other Ambulatory Visit: Payer: Self-pay | Admitting: Family Medicine

## 2016-06-15 ENCOUNTER — Ambulatory Visit: Payer: Medicare Other

## 2016-06-15 ENCOUNTER — Other Ambulatory Visit
Admission: RE | Admit: 2016-06-15 | Discharge: 2016-06-15 | Disposition: A | Discharge status: Home / Self Care | Payer: Medicare Other | Source: Ambulatory Visit | Attending: Gastroenterology | Admitting: Gastroenterology

## 2016-06-15 DIAGNOSIS — R933 Abnormal findings on diagnostic imaging of other parts of digestive tract: Secondary | ICD-10-CM | POA: Diagnosis not present

## 2016-06-15 DIAGNOSIS — G4733 Obstructive sleep apnea (adult) (pediatric): Secondary | ICD-10-CM | POA: Insufficient documentation

## 2016-06-15 DIAGNOSIS — Z79899 Other long term (current) drug therapy: Secondary | ICD-10-CM | POA: Diagnosis not present

## 2016-06-15 DIAGNOSIS — I10 Essential (primary) hypertension: Secondary | ICD-10-CM | POA: Insufficient documentation

## 2016-06-15 DIAGNOSIS — K523 Indeterminate colitis: Secondary | ICD-10-CM | POA: Diagnosis not present

## 2016-06-15 DIAGNOSIS — R197 Diarrhea, unspecified: Secondary | ICD-10-CM | POA: Diagnosis not present

## 2016-06-15 DIAGNOSIS — K5090 Crohn's disease, unspecified, without complications: Secondary | ICD-10-CM | POA: Insufficient documentation

## 2016-06-15 DIAGNOSIS — G4730 Sleep apnea, unspecified: Secondary | ICD-10-CM | POA: Diagnosis not present

## 2016-06-15 DIAGNOSIS — K219 Gastro-esophageal reflux disease without esophagitis: Secondary | ICD-10-CM | POA: Diagnosis not present

## 2016-06-19 DIAGNOSIS — G4733 Obstructive sleep apnea (adult) (pediatric): Secondary | ICD-10-CM | POA: Diagnosis not present

## 2016-06-22 ENCOUNTER — Ambulatory Visit: Payer: Medicare Other | Attending: Internal Medicine

## 2016-06-22 DIAGNOSIS — E1122 Type 2 diabetes mellitus with diabetic chronic kidney disease: Secondary | ICD-10-CM | POA: Diagnosis not present

## 2016-06-22 DIAGNOSIS — I10 Essential (primary) hypertension: Secondary | ICD-10-CM | POA: Insufficient documentation

## 2016-06-22 DIAGNOSIS — G4733 Obstructive sleep apnea (adult) (pediatric): Secondary | ICD-10-CM | POA: Insufficient documentation

## 2016-06-22 DIAGNOSIS — N182 Chronic kidney disease, stage 2 (mild): Secondary | ICD-10-CM | POA: Diagnosis not present

## 2016-06-22 DIAGNOSIS — E781 Pure hyperglyceridemia: Secondary | ICD-10-CM | POA: Diagnosis not present

## 2016-06-22 DIAGNOSIS — E1165 Type 2 diabetes mellitus with hyperglycemia: Secondary | ICD-10-CM | POA: Diagnosis not present

## 2016-06-22 DIAGNOSIS — E1142 Type 2 diabetes mellitus with diabetic polyneuropathy: Secondary | ICD-10-CM | POA: Diagnosis not present

## 2016-06-22 DIAGNOSIS — Z794 Long term (current) use of insulin: Secondary | ICD-10-CM | POA: Diagnosis not present

## 2016-06-28 ENCOUNTER — Encounter: Payer: Self-pay | Admitting: Gastroenterology

## 2016-07-07 ENCOUNTER — Other Ambulatory Visit: Payer: Self-pay | Admitting: Family Medicine

## 2016-07-07 NOTE — Telephone Encounter (Signed)
Historical medication. Pt last seen 04/07/16. Please advise?

## 2016-07-08 DIAGNOSIS — Z961 Presence of intraocular lens: Secondary | ICD-10-CM | POA: Diagnosis not present

## 2016-07-08 DIAGNOSIS — H1851 Endothelial corneal dystrophy: Secondary | ICD-10-CM | POA: Diagnosis not present

## 2016-07-08 DIAGNOSIS — E119 Type 2 diabetes mellitus without complications: Secondary | ICD-10-CM | POA: Diagnosis not present

## 2016-07-14 DIAGNOSIS — Z23 Encounter for immunization: Secondary | ICD-10-CM | POA: Diagnosis not present

## 2016-07-19 ENCOUNTER — Other Ambulatory Visit: Payer: Self-pay | Admitting: Family Medicine

## 2016-07-19 ENCOUNTER — Other Ambulatory Visit (INDEPENDENT_AMBULATORY_CARE_PROVIDER_SITE_OTHER): Payer: Medicare Other

## 2016-07-19 ENCOUNTER — Other Ambulatory Visit: Payer: Self-pay

## 2016-07-19 DIAGNOSIS — Z7901 Long term (current) use of anticoagulants: Secondary | ICD-10-CM | POA: Diagnosis not present

## 2016-07-19 DIAGNOSIS — Z5181 Encounter for therapeutic drug level monitoring: Secondary | ICD-10-CM | POA: Diagnosis not present

## 2016-07-19 DIAGNOSIS — Z794 Long term (current) use of insulin: Secondary | ICD-10-CM | POA: Diagnosis not present

## 2016-07-19 DIAGNOSIS — E1165 Type 2 diabetes mellitus with hyperglycemia: Secondary | ICD-10-CM | POA: Diagnosis not present

## 2016-07-19 LAB — PROTIME-INR
INR: 2.3 ratio — ABNORMAL HIGH (ref 0.8–1.0)
Prothrombin Time: 24.9 s — ABNORMAL HIGH (ref 9.6–13.1)

## 2016-07-20 ENCOUNTER — Other Ambulatory Visit: Payer: Self-pay | Admitting: Family Medicine

## 2016-07-20 MED ORDER — COLCHICINE 0.6 MG PO TABS: 0.6000 mg | ORAL_TABLET | Freq: Every day | ORAL | 1 refills | Status: DC

## 2016-07-20 NOTE — Telephone Encounter (Signed)
Historical medication. Pt last seen 04/07/16. Please advise?

## 2016-07-21 DIAGNOSIS — E1142 Type 2 diabetes mellitus with diabetic polyneuropathy: Secondary | ICD-10-CM | POA: Diagnosis not present

## 2016-07-21 DIAGNOSIS — E1165 Type 2 diabetes mellitus with hyperglycemia: Secondary | ICD-10-CM | POA: Diagnosis not present

## 2016-07-21 DIAGNOSIS — Z794 Long term (current) use of insulin: Secondary | ICD-10-CM | POA: Diagnosis not present

## 2016-08-09 ENCOUNTER — Other Ambulatory Visit: Payer: Self-pay | Admitting: Family Medicine

## 2016-08-09 NOTE — Telephone Encounter (Signed)
Please advise on refills. Historical provider

## 2016-08-17 ENCOUNTER — Other Ambulatory Visit: Payer: Self-pay

## 2016-08-17 DIAGNOSIS — Z5181 Encounter for therapeutic drug level monitoring: Secondary | ICD-10-CM

## 2016-08-17 DIAGNOSIS — G4733 Obstructive sleep apnea (adult) (pediatric): Secondary | ICD-10-CM | POA: Diagnosis not present

## 2016-08-17 DIAGNOSIS — I480 Paroxysmal atrial fibrillation: Secondary | ICD-10-CM | POA: Diagnosis not present

## 2016-08-17 DIAGNOSIS — I2510 Atherosclerotic heart disease of native coronary artery without angina pectoris: Secondary | ICD-10-CM | POA: Diagnosis not present

## 2016-08-17 DIAGNOSIS — Z7901 Long term (current) use of anticoagulants: Principal | ICD-10-CM

## 2016-08-18 ENCOUNTER — Other Ambulatory Visit (INDEPENDENT_AMBULATORY_CARE_PROVIDER_SITE_OTHER): Payer: Medicare Other

## 2016-08-18 DIAGNOSIS — Z7901 Long term (current) use of anticoagulants: Secondary | ICD-10-CM

## 2016-08-18 DIAGNOSIS — Z5181 Encounter for therapeutic drug level monitoring: Secondary | ICD-10-CM

## 2016-08-18 LAB — PROTIME-INR
INR: 2.8 ratio — ABNORMAL HIGH (ref 0.8–1.0)
Prothrombin Time: 30.3 s — ABNORMAL HIGH (ref 9.6–13.1)

## 2016-08-31 ENCOUNTER — Other Ambulatory Visit: Payer: Self-pay | Admitting: Family Medicine

## 2016-09-01 DIAGNOSIS — R05 Cough: Secondary | ICD-10-CM | POA: Diagnosis not present

## 2016-09-01 DIAGNOSIS — J069 Acute upper respiratory infection, unspecified: Secondary | ICD-10-CM | POA: Diagnosis not present

## 2016-09-01 NOTE — Telephone Encounter (Signed)
Historical medication. Pt last seen 04/07/16. Please advise?

## 2016-09-01 NOTE — Telephone Encounter (Signed)
faxed

## 2016-09-02 DIAGNOSIS — R05 Cough: Secondary | ICD-10-CM | POA: Diagnosis not present

## 2016-09-09 ENCOUNTER — Ambulatory Visit (INDEPENDENT_AMBULATORY_CARE_PROVIDER_SITE_OTHER): Payer: Medicare Other | Admitting: Family Medicine

## 2016-09-09 ENCOUNTER — Encounter: Payer: Self-pay | Admitting: Family Medicine

## 2016-09-09 DIAGNOSIS — I2510 Atherosclerotic heart disease of native coronary artery without angina pectoris: Secondary | ICD-10-CM

## 2016-09-09 DIAGNOSIS — R0982 Postnasal drip: Secondary | ICD-10-CM | POA: Diagnosis not present

## 2016-09-09 DIAGNOSIS — J069 Acute upper respiratory infection, unspecified: Secondary | ICD-10-CM | POA: Diagnosis not present

## 2016-09-09 MED ORDER — AZELASTINE HCL 0.1 % NA SOLN: 2.0000 | Freq: Two times a day (BID) | NASAL | 1 refills | Status: DC

## 2016-09-09 NOTE — Progress Notes (Signed)
   Subjective:  Patient ID: Chase Cabrera, male    DOB: May 15, 1946  Age: 70 y.o. MRN: [id number]  CC: URI, Post nasal drip  HPI:  70 year old male with hypertension, hyper lipidemia, DM 2, CAD, A. fib presents with the above complaints.  Patient has been sick for the past 10 days. His wife has had similar symptoms. He has been experiencing cough, congestion, and postnasal drip. He was seen at [facility] walk-in clinic and was diagnosed with URI. He was treated with Augmentin and Hycodan. He states that he's had significant improvement. He still has difficulty with postnasal drip. No associated fevers or chills. No other complaints or concerns at this time.  Social Hx   Social History   Social History  . Marital status: Married    Spouse name: N/A  . Number of children: N/A  . Years of education: N/A   Social History Main Topics  . Smoking status: Former Research scientist (life sciences)  . Smokeless tobacco: Never Used  . Alcohol use No  . Drug use: No  . Sexual activity: Not Asked   Other Topics Concern  . None   Social History Narrative  . None   Review of Systems  Constitutional: Negative for fever.  HENT: Positive for congestion and postnasal drip.   Respiratory: Positive for cough.    Objective:  BP 135/61   Pulse (!) 59   Temp 98.3 F (36.8 C) (Oral)   Resp 14   Wt 195 lb (88.5 kg)   SpO2 96%   BMI 31.24 kg/m   BP/Weight 09/09/2016 04/09/2016 7/97/2820  Systolic BP 601 561 537  Diastolic BP 61 71 60  Wt. (Lbs) 195 193 193  BMI 31.24 30.92 30.92   Physical Exam  Constitutional: He is oriented to person, place, and time. He appears well-developed. No distress.  Cardiovascular: Normal rate and regular rhythm.   Pulmonary/Chest: Effort normal and breath sounds normal.  Neurological: He is alert and oriented to person, place, and time.  Psychiatric: He has a normal mood and affect.  Vitals reviewed.  Lab Results  Component Value Date   WBC 7.5 04/09/2016   HGB 13.8 04/09/2016     HCT 39.7 (L) 04/09/2016   PLT 279 04/09/2016   GLUCOSE 134 (H) 03/24/2016   CHOL 124 03/24/2016   TRIG 389.0 (H) 03/24/2016   HDL 25.60 (L) 03/24/2016   LDLDIRECT 51.0 03/24/2016   ALT 27 03/24/2016   AST 23 03/24/2016   NA 136 03/24/2016   K 4.3 03/24/2016   CL 102 03/24/2016   CREATININE 0.94 03/24/2016   BUN 18 03/24/2016   CO2 24 03/24/2016   INR 2.8 (H) 08/18/2016   Assessment & Plan:   Problem List Items Addressed This Visit    URI (upper respiratory infection)    New acute problem. Improving. Finish augmentin.      Post-nasal drip    New problem. Treating with Azelastine.        Meds ordered this encounter  Medications  . azelastine (ASTELIN) 0.1 % nasal spray    Sig: Place 2 sprays into both nostrils 2 (two) times daily. Use in each nostril as directed    Dispense:  30 mL    Refill:  1    Follow-up: PRN  [place]

## 2016-09-09 NOTE — Assessment & Plan Note (Signed)
New problem. Treating with Azelastine.

## 2016-09-09 NOTE — Patient Instructions (Signed)
Finish the antibiotic.  Continue the cough syrup.  Use the nasal spray as prescribed.  Take care  Dr. Lacinda Axon

## 2016-09-09 NOTE — Progress Notes (Signed)
Pre visit review using our clinic review tool, if applicable. No additional management support is needed unless otherwise documented below in the visit note. 

## 2016-09-09 NOTE — Assessment & Plan Note (Signed)
New acute problem. Improving. Finish augmentin.

## 2016-09-10 ENCOUNTER — Ambulatory Visit: Payer: Medicare Other | Admitting: Podiatry

## 2016-09-15 DIAGNOSIS — R197 Diarrhea, unspecified: Secondary | ICD-10-CM | POA: Diagnosis not present

## 2016-09-15 DIAGNOSIS — K219 Gastro-esophageal reflux disease without esophagitis: Secondary | ICD-10-CM | POA: Diagnosis not present

## 2016-09-15 DIAGNOSIS — K50119 Crohn's disease of large intestine with unspecified complications: Secondary | ICD-10-CM | POA: Diagnosis not present

## 2016-09-17 ENCOUNTER — Other Ambulatory Visit: Payer: Self-pay

## 2016-09-17 DIAGNOSIS — Z7901 Long term (current) use of anticoagulants: Principal | ICD-10-CM

## 2016-09-17 DIAGNOSIS — Z5181 Encounter for therapeutic drug level monitoring: Secondary | ICD-10-CM

## 2016-09-20 ENCOUNTER — Other Ambulatory Visit (INDEPENDENT_AMBULATORY_CARE_PROVIDER_SITE_OTHER): Payer: Medicare Other

## 2016-09-20 ENCOUNTER — Telehealth: Payer: Self-pay | Admitting: Family Medicine

## 2016-09-20 ENCOUNTER — Other Ambulatory Visit: Payer: Self-pay | Admitting: Family Medicine

## 2016-09-20 ENCOUNTER — Telehealth: Payer: Self-pay | Admitting: *Deleted

## 2016-09-20 DIAGNOSIS — Z7901 Long term (current) use of anticoagulants: Secondary | ICD-10-CM | POA: Diagnosis not present

## 2016-09-20 DIAGNOSIS — Z5181 Encounter for therapeutic drug level monitoring: Secondary | ICD-10-CM

## 2016-09-20 LAB — PROTIME-INR
INR: 5 ratio — AB (ref 0.8–1.0)
Prothrombin Time: 55.2 s (ref 9.6–13.1)

## 2016-09-20 NOTE — Telephone Encounter (Signed)
Pt is requesting to have orders put in for his next protine, (labs).

## 2016-09-20 NOTE — Telephone Encounter (Signed)
Pt was called given labs and given medication directions. He is scheduled for Wednesday at 2 p.m.

## 2016-09-20 NOTE — Telephone Encounter (Signed)
Critical lab result:  PT: 55.2 (Critical) INR: 5.0

## 2016-09-20 NOTE — Telephone Encounter (Signed)
Hold today and tomorrow (2 days). Recheck on Wed.

## 2016-09-20 NOTE — Telephone Encounter (Signed)
Please place.

## 2016-09-21 ENCOUNTER — Other Ambulatory Visit: Payer: Self-pay | Admitting: Radiology

## 2016-09-21 DIAGNOSIS — Z5181 Encounter for therapeutic drug level monitoring: Secondary | ICD-10-CM

## 2016-09-21 DIAGNOSIS — Z7901 Long term (current) use of anticoagulants: Principal | ICD-10-CM

## 2016-09-22 ENCOUNTER — Other Ambulatory Visit (INDEPENDENT_AMBULATORY_CARE_PROVIDER_SITE_OTHER): Payer: Medicare Other

## 2016-09-22 DIAGNOSIS — Z7901 Long term (current) use of anticoagulants: Secondary | ICD-10-CM | POA: Diagnosis not present

## 2016-09-22 DIAGNOSIS — Z5181 Encounter for therapeutic drug level monitoring: Secondary | ICD-10-CM

## 2016-09-22 LAB — PROTIME-INR
INR: 1.8 ratio — AB (ref 0.8–1.0)
Prothrombin Time: 19.6 s — ABNORMAL HIGH (ref 9.6–13.1)

## 2016-09-23 ENCOUNTER — Telehealth: Payer: Self-pay | Admitting: Family Medicine

## 2016-09-23 NOTE — Telephone Encounter (Signed)
Pt wife wanted to follow up on pt INR from 09/22/16. Please advise? Thank you!  Call pt @ [phone].

## 2016-09-23 NOTE — Telephone Encounter (Signed)
Wife was called and given results. Per DPR this is okay.

## 2016-09-28 ENCOUNTER — Ambulatory Visit (INDEPENDENT_AMBULATORY_CARE_PROVIDER_SITE_OTHER): Payer: Medicare Other | Admitting: Podiatry

## 2016-09-28 ENCOUNTER — Encounter: Payer: Self-pay | Admitting: Podiatry

## 2016-09-28 DIAGNOSIS — E089 Diabetes mellitus due to underlying condition without complications: Secondary | ICD-10-CM

## 2016-09-28 DIAGNOSIS — M2042 Other hammer toe(s) (acquired), left foot: Secondary | ICD-10-CM | POA: Diagnosis not present

## 2016-09-28 DIAGNOSIS — M2041 Other hammer toe(s) (acquired), right foot: Secondary | ICD-10-CM | POA: Diagnosis not present

## 2016-09-28 MED ORDER — CLOBETASOL PROPIONATE 0.05 % EX CREA: 1.0000 "application " | TOPICAL_CREAM | Freq: Two times a day (BID) | CUTANEOUS | 1 refills | Status: DC

## 2016-10-03 NOTE — Progress Notes (Signed)
   Subjective:  Patient presents today as a new patient with a history of diabetes mellitus. Patient presents today for diabetic lower extremity evaluation. Patient also has a complaint of rashes to the bilateral lower extremity legs. These rashes have been ongoing for several months and they itch on occasion.    Objective/Physical Exam General: The patient is alert and oriented x3 in no acute distress.  Dermatology: Skin is warm, dry and supple bilateral lower extremities. Negative for open lesions or macerations.  Vascular: Palpable pedal pulses bilaterally. No edema or erythema noted. Capillary refill within normal limits.  Neurological: Epicritic and protective threshold grossly intact bilaterally.   Musculoskeletal Exam: Range of motion within normal limits to all pedal and ankle joints bilateral. Muscle strength 5/5 in all groups bilateral.    Assessment: #1 diabetes mellitus, controlled w/out complication #2 hammertoes second digits bilateral #3 dermatitis bilateral lower extremity legs  Plan of Care:  #1 Patient was evaluated. #2 prescription for temovate cream #3 authorization for diabetic shoes initiated today  Edrick Kins, DPM [facility]  Dr. Edrick Kins, [address]                                        [place], [place] [address]                Office [phone]  Fax [phone]

## 2016-10-08 ENCOUNTER — Ambulatory Visit (INDEPENDENT_AMBULATORY_CARE_PROVIDER_SITE_OTHER): Payer: Medicare Other | Admitting: Family Medicine

## 2016-10-08 ENCOUNTER — Encounter: Payer: Self-pay | Admitting: Family Medicine

## 2016-10-08 VITALS — BP 138/73 | HR 57 | Temp 97.4°F | Wt 201.2 lb

## 2016-10-08 DIAGNOSIS — I10 Essential (primary) hypertension: Secondary | ICD-10-CM

## 2016-10-08 DIAGNOSIS — I4891 Unspecified atrial fibrillation: Secondary | ICD-10-CM

## 2016-10-08 DIAGNOSIS — I2510 Atherosclerotic heart disease of native coronary artery without angina pectoris: Secondary | ICD-10-CM | POA: Diagnosis not present

## 2016-10-08 DIAGNOSIS — E1142 Type 2 diabetes mellitus with diabetic polyneuropathy: Secondary | ICD-10-CM | POA: Diagnosis not present

## 2016-10-08 DIAGNOSIS — E785 Hyperlipidemia, unspecified: Secondary | ICD-10-CM

## 2016-10-08 NOTE — Patient Instructions (Signed)
Continue your meds.  Follow up in 6 months.  Take care  Dr. Lacinda Axon

## 2016-10-09 NOTE — Assessment & Plan Note (Signed)
Reasonable control. Continue close follow up with Endo. Continue current meds.

## 2016-10-09 NOTE — Assessment & Plan Note (Signed)
Stable. Continue Coreg, Lasix, Hydralazine.

## 2016-10-09 NOTE — Progress Notes (Signed)
   Subjective:  Patient ID: [name], male    DOB: Apr 13, 1946  Age: 71 y.o. MRN: [id number]  CC: Follow up  HPI:  71 year old male with HTN, HLD, DM-2, CAD, Atrial fib presents for follow up.  HTN  Stable on Coreg, Lasix, Hydralazine.  HLD  At goal on Pravastatin.  CAD  Followed by cardiology.  Stable at this time.  No reports of chest pain or SOB.  DM-2  Most recent A1C was 7.5. Reasonable control at patient's age.  Followed by Endo. On Levemir, Humalog, Januvia, Metformin.  Atrial Fib   Stable on Coreg and Warfarin.  Social Hx   Social History   Social History  . Marital status: Married    Spouse name: N/A  . Number of children: N/A  . Years of education: N/A   Social History Main Topics  . Smoking status: Former Research scientist (life sciences)  . Smokeless tobacco: Never Used  . Alcohol use No  . Drug use: No  . Sexual activity: Not Asked   Other Topics Concern  . None   Social History Narrative  . None    Review of Systems  Respiratory: Negative.   Cardiovascular: Negative.    Objective:  BP 138/73   Pulse (!) 57   Temp 97.4 F (36.3 C) (Oral)   Wt 201 lb 3.2 oz (91.3 kg)   SpO2 93%   BMI 32.23 kg/m   BP/Weight 10/08/2016 09/09/2016 12/28/3843  Systolic BP 364 680 321  Diastolic BP 73 61 71  Wt. (Lbs) 201.2 195 193  BMI 32.23 31.24 30.92    Physical Exam  Constitutional: He is oriented to person, place, and time. He appears well-developed. No distress.  Cardiovascular: An irregularly irregular rhythm present.  Pulmonary/Chest: Effort normal and breath sounds normal.  Neurological: He is alert and oriented to person, place, and time.  Psychiatric: He has a normal mood and affect.  Vitals reviewed.  Lab Results  Component Value Date   WBC 7.5 04/09/2016   HGB 13.8 04/09/2016   HCT 39.7 (L) 04/09/2016   PLT 279 04/09/2016   GLUCOSE 134 (H) 03/24/2016   CHOL 124 03/24/2016   TRIG 389.0 (H) 03/24/2016   HDL 25.60 (L) 03/24/2016   LDLDIRECT  51.0 03/24/2016   ALT 27 03/24/2016   AST 23 03/24/2016   NA 136 03/24/2016   K 4.3 03/24/2016   CL 102 03/24/2016   CREATININE 0.94 03/24/2016   BUN 18 03/24/2016   CO2 24 03/24/2016   INR 1.8 (H) 09/22/2016    Assessment & Plan:   Problem List Items Addressed This Visit    Hyperlipidemia    At goal on Pravastatin.      Essential hypertension - Primary    Stable. Continue Coreg, Lasix, Hydralazine.      DM type 2 with diabetic peripheral neuropathy (HCC)    Reasonable control. Continue close follow up with Endo. Continue current meds.      Relevant Medications   insulin lispro (HUMALOG) 100 UNIT/ML KiwkPen   CAD (coronary artery disease)    Stable.       Atrial fibrillation (HCC)    Stable on Coreg and Warfarin.         Meds ordered this encounter  Medications  . insulin lispro (HUMALOG) 100 UNIT/ML KiwkPen    Sig: Take 15 - 25 units up to three times daily as directed    Follow-up: 6 months  [place]

## 2016-10-09 NOTE — Assessment & Plan Note (Signed)
At goal on Pravastatin.

## 2016-10-09 NOTE — Assessment & Plan Note (Signed)
Stable

## 2016-10-09 NOTE — Assessment & Plan Note (Signed)
Stable on Coreg and Warfarin.

## 2016-10-15 ENCOUNTER — Other Ambulatory Visit: Payer: Self-pay | Admitting: Family Medicine

## 2016-10-15 NOTE — Telephone Encounter (Signed)
Historical medication. Pt last seen 10/08/16. Please advise?

## 2016-10-18 DIAGNOSIS — Z85828 Personal history of other malignant neoplasm of skin: Secondary | ICD-10-CM | POA: Diagnosis not present

## 2016-10-18 DIAGNOSIS — L570 Actinic keratosis: Secondary | ICD-10-CM | POA: Diagnosis not present

## 2016-10-21 ENCOUNTER — Other Ambulatory Visit: Payer: Self-pay | Admitting: Radiology

## 2016-10-21 DIAGNOSIS — Z7901 Long term (current) use of anticoagulants: Principal | ICD-10-CM

## 2016-10-21 DIAGNOSIS — Z5181 Encounter for therapeutic drug level monitoring: Secondary | ICD-10-CM

## 2016-10-22 ENCOUNTER — Other Ambulatory Visit: Payer: Medicare Other

## 2016-10-22 DIAGNOSIS — Z794 Long term (current) use of insulin: Secondary | ICD-10-CM | POA: Diagnosis not present

## 2016-10-22 DIAGNOSIS — E1122 Type 2 diabetes mellitus with diabetic chronic kidney disease: Secondary | ICD-10-CM | POA: Diagnosis not present

## 2016-10-22 DIAGNOSIS — N182 Chronic kidney disease, stage 2 (mild): Secondary | ICD-10-CM | POA: Diagnosis not present

## 2016-10-22 DIAGNOSIS — E1142 Type 2 diabetes mellitus with diabetic polyneuropathy: Secondary | ICD-10-CM | POA: Diagnosis not present

## 2016-10-22 DIAGNOSIS — E781 Pure hyperglyceridemia: Secondary | ICD-10-CM | POA: Diagnosis not present

## 2016-10-25 ENCOUNTER — Telehealth: Payer: Self-pay | Admitting: Podiatry

## 2016-10-25 ENCOUNTER — Other Ambulatory Visit (INDEPENDENT_AMBULATORY_CARE_PROVIDER_SITE_OTHER): Payer: Medicare Other

## 2016-10-25 DIAGNOSIS — Z7901 Long term (current) use of anticoagulants: Secondary | ICD-10-CM | POA: Diagnosis not present

## 2016-10-25 DIAGNOSIS — Z5181 Encounter for therapeutic drug level monitoring: Secondary | ICD-10-CM

## 2016-10-25 LAB — PROTIME-INR
INR: 4.4 ratio — AB (ref 0.8–1.0)
PROTHROMBIN TIME: 48.6 s — AB (ref 9.6–13.1)

## 2016-10-25 NOTE — Telephone Encounter (Signed)
Patient called Chase Cabrera and was asking about the status of his diabetic shoe order. Last seen on 09/28/16. Caryl Pina checked the safe steps system and no paperwork was ever started for this patient. Pt would like a call back about the status. Can you start the paperwork for him?

## 2016-10-25 NOTE — Telephone Encounter (Signed)
Pt. Wife called checking on the status of his diabetic shoes.

## 2016-10-26 ENCOUNTER — Telehealth: Payer: Self-pay | Admitting: *Deleted

## 2016-10-26 DIAGNOSIS — Z7901 Long term (current) use of anticoagulants: Secondary | ICD-10-CM

## 2016-10-26 NOTE — Telephone Encounter (Signed)
Standing order placed for PT/INR

## 2016-10-28 ENCOUNTER — Other Ambulatory Visit (INDEPENDENT_AMBULATORY_CARE_PROVIDER_SITE_OTHER): Payer: Medicare Other

## 2016-10-28 DIAGNOSIS — Z7901 Long term (current) use of anticoagulants: Secondary | ICD-10-CM

## 2016-10-28 LAB — PROTIME-INR
INR: 1.6 ratio — ABNORMAL HIGH (ref 0.8–1.0)
Prothrombin Time: 16.9 s — ABNORMAL HIGH (ref 9.6–13.1)

## 2016-11-02 ENCOUNTER — Ambulatory Visit (INDEPENDENT_AMBULATORY_CARE_PROVIDER_SITE_OTHER): Payer: Medicare Other | Admitting: Family Medicine

## 2016-11-02 ENCOUNTER — Encounter: Payer: Self-pay | Admitting: Family Medicine

## 2016-11-02 VITALS — BP 153/66 | HR 62 | Temp 98.1°F | Wt 204.2 lb

## 2016-11-02 DIAGNOSIS — I2510 Atherosclerotic heart disease of native coronary artery without angina pectoris: Secondary | ICD-10-CM | POA: Diagnosis not present

## 2016-11-02 DIAGNOSIS — M48061 Spinal stenosis, lumbar region without neurogenic claudication: Secondary | ICD-10-CM

## 2016-11-02 MED ORDER — HYDROCODONE-ACETAMINOPHEN 5-325 MG PO TABS: 1.0000 | ORAL_TABLET | Freq: Four times a day (QID) | ORAL | 0 refills | Status: DC | PRN

## 2016-11-02 NOTE — Progress Notes (Signed)
   Subjective:  Patient ID: Chase Cabrera, male    DOB: Dec 18, 1945  Age: 71 y.o. MRN: [id number]  CC: Low back pain  HPI:  71 year old male with an extensive past medical history including CAD, atrial fibrillation, DM 2, hypertension, hyperlipidemia, renal artery stenosis, lumbar stenosis presents with complaints of low back pain.  Patient has long-standing history of lumbar stenosis. He reports a recent worsening in his back pain over the past 1.5 weeks. Pain is located in the low back. Radiates anteriorly. He reports it is sharp and severe. No known relieving factors. No medications or interventions tried as he is on Warfarin and should not taking anti-inflammatories. Exacerbated by activity. No relieving factors.  Social Hx   Social History   Social History  . Marital status: Married    Spouse name: N/A  . Number of children: N/A  . Years of education: N/A   Social History Main Topics  . Smoking status: Former Research scientist (life sciences)  . Smokeless tobacco: Never Used  . Alcohol use No  . Drug use: No  . Sexual activity: Not Asked   Other Topics Concern  . None   Social History Narrative  . None   Review of Systems  Musculoskeletal: Positive for back pain.  Neurological: Positive for numbness.   Objective:  BP (!) 153/66   Pulse 62   Temp 98.1 F (36.7 C) (Oral)   Wt 204 lb 3.2 oz (92.6 kg)   SpO2 98%   BMI 32.71 kg/m   BP/Weight 11/02/2016 10/08/2016 00/93/8182  Systolic BP 993 716 967  Diastolic BP 66 73 61  Wt. (Lbs) 204.2 201.2 195  BMI 32.71 32.23 31.24   Physical Exam  Constitutional: He is oriented to person, place, and time. He appears well-developed. No distress.  Cardiovascular: Normal rate and regular rhythm.   Pulmonary/Chest: Effort normal and breath sounds normal.  Musculoskeletal:  Lumbar spine - midline and paraspinal muscle tenderness bilaterally. Severe tenderness to palpation.  Neurological: He is alert and oriented to person, place, and time.    Psychiatric: He has a normal mood and affect.  Vitals reviewed.  Lab Results  Component Value Date   WBC 7.5 04/09/2016   HGB 13.8 04/09/2016   HCT 39.7 (L) 04/09/2016   PLT 279 04/09/2016   GLUCOSE 134 (H) 03/24/2016   CHOL 124 03/24/2016   TRIG 389.0 (H) 03/24/2016   HDL 25.60 (L) 03/24/2016   LDLDIRECT 51.0 03/24/2016   ALT 27 03/24/2016   AST 23 03/24/2016   NA 136 03/24/2016   K 4.3 03/24/2016   CL 102 03/24/2016   CREATININE 0.94 03/24/2016   BUN 18 03/24/2016   CO2 24 03/24/2016   INR 1.6 (H) 10/28/2016   Assessment & Plan:   Problem List Items Addressed This Visit    Lumbar canal stenosis - Primary    Established problem, worsening. Treating with Vicodin. 5 day supply given per [place] STOP act. Arranging referral to Neurosurgery/ortho (has seen Dr. Owens Shark in the past).      Relevant Orders   Ambulatory referral to Neurosurgery     Meds ordered this encounter  Medications  . HYDROcodone-acetaminophen (NORCO/VICODIN) 5-325 MG tablet    Sig: Take 1-2 tablets by mouth every 6 (six) hours as needed for moderate pain.    Dispense:  40 tablet    Refill:  0    Follow-up: PRN  [place]

## 2016-11-02 NOTE — Patient Instructions (Signed)
Use the pain medication as prescribed.   We will set up the appt.  Take care  Dr. Lacinda Axon

## 2016-11-02 NOTE — Progress Notes (Signed)
Pre visit review using our clinic review tool, if applicable. No additional management support is needed unless otherwise documented below in the visit note. 

## 2016-11-02 NOTE — Assessment & Plan Note (Signed)
Established problem, worsening. Treating with Vicodin. 5 day supply given per [place] STOP act. Arranging referral to Neurosurgery/ortho (has seen Dr. Owens Shark in the past).

## 2016-11-03 ENCOUNTER — Encounter: Payer: Self-pay | Admitting: Family Medicine

## 2016-11-08 ENCOUNTER — Other Ambulatory Visit: Payer: Self-pay | Admitting: Family Medicine

## 2016-11-12 ENCOUNTER — Other Ambulatory Visit (INDEPENDENT_AMBULATORY_CARE_PROVIDER_SITE_OTHER): Payer: Medicare Other

## 2016-11-12 ENCOUNTER — Other Ambulatory Visit: Payer: Medicare Other

## 2016-11-12 DIAGNOSIS — Z7901 Long term (current) use of anticoagulants: Secondary | ICD-10-CM | POA: Diagnosis not present

## 2016-11-12 LAB — PROTIME-INR
INR: 2 ratio — AB (ref 0.8–1.0)
PROTHROMBIN TIME: 21.2 s — AB (ref 9.6–13.1)

## 2016-11-15 ENCOUNTER — Other Ambulatory Visit: Payer: Self-pay | Admitting: Family Medicine

## 2016-11-15 MED ORDER — WARFARIN SODIUM 4 MG PO TABS: ORAL_TABLET | ORAL | 1 refills | Status: DC

## 2016-11-15 NOTE — Telephone Encounter (Signed)
Historical medication. Pt last seen 11/02/16. Please advise?

## 2016-11-16 ENCOUNTER — Telehealth: Payer: Self-pay | Admitting: *Deleted

## 2016-11-16 ENCOUNTER — Other Ambulatory Visit: Payer: Self-pay | Admitting: Family Medicine

## 2016-11-16 MED ORDER — WARFARIN SODIUM 4 MG PO TABS: ORAL_TABLET | ORAL | 3 refills | Status: DC

## 2016-11-16 MED ORDER — WARFARIN SODIUM 3 MG PO TABS: ORAL_TABLET | ORAL | 3 refills | Status: DC

## 2016-11-16 NOTE — Telephone Encounter (Signed)
[facility] in [place] has requested clarity on the Rx for warfarin  Contact [phone]

## 2016-11-16 NOTE — Telephone Encounter (Signed)
PCP sending a new rx with clarified directions.

## 2016-11-18 DIAGNOSIS — M48061 Spinal stenosis, lumbar region without neurogenic claudication: Secondary | ICD-10-CM | POA: Diagnosis not present

## 2016-11-18 DIAGNOSIS — Z981 Arthrodesis status: Secondary | ICD-10-CM | POA: Diagnosis not present

## 2016-11-29 ENCOUNTER — Other Ambulatory Visit (INDEPENDENT_AMBULATORY_CARE_PROVIDER_SITE_OTHER): Payer: Medicare Other

## 2016-11-29 ENCOUNTER — Other Ambulatory Visit: Payer: Medicare Other

## 2016-11-29 DIAGNOSIS — Z7901 Long term (current) use of anticoagulants: Secondary | ICD-10-CM

## 2016-11-29 DIAGNOSIS — Z5181 Encounter for therapeutic drug level monitoring: Secondary | ICD-10-CM | POA: Diagnosis not present

## 2016-11-30 ENCOUNTER — Telehealth: Payer: Self-pay | Admitting: Family Medicine

## 2016-11-30 DIAGNOSIS — Z981 Arthrodesis status: Secondary | ICD-10-CM | POA: Diagnosis not present

## 2016-11-30 DIAGNOSIS — G8929 Other chronic pain: Secondary | ICD-10-CM | POA: Diagnosis not present

## 2016-11-30 DIAGNOSIS — M545 Low back pain: Secondary | ICD-10-CM | POA: Diagnosis not present

## 2016-11-30 LAB — PROTIME-INR
INR: 2 — AB
PROTHROMBIN TIME: 21 s — AB (ref 9.0–11.5)

## 2016-11-30 NOTE — Telephone Encounter (Signed)
Seth Bake called back and was given pts PT/INR results of 2.0.

## 2016-11-30 NOTE — Telephone Encounter (Signed)
Seth Bake from [place] is calling looking for pt/inr results. Pt is having a procedure done today. Please advise, thank you!  Fax @[phone] Phone @ [phone]

## 2016-11-30 NOTE — Telephone Encounter (Signed)
Called to give INR/PT results but had to leave a voicemail.

## 2016-12-02 DIAGNOSIS — I2510 Atherosclerotic heart disease of native coronary artery without angina pectoris: Secondary | ICD-10-CM | POA: Diagnosis not present

## 2016-12-02 DIAGNOSIS — I10 Essential (primary) hypertension: Secondary | ICD-10-CM | POA: Diagnosis not present

## 2016-12-02 DIAGNOSIS — G4739 Other sleep apnea: Secondary | ICD-10-CM | POA: Diagnosis not present

## 2016-12-02 DIAGNOSIS — I480 Paroxysmal atrial fibrillation: Secondary | ICD-10-CM | POA: Diagnosis not present

## 2016-12-03 ENCOUNTER — Encounter: Payer: Self-pay | Admitting: Emergency Medicine

## 2016-12-03 ENCOUNTER — Emergency Department
Admission: EM | Admit: 2016-12-03 | Discharge: 2016-12-03 | Discharge status: Against Medical Advice | Payer: Medicare Other | Attending: Emergency Medicine | Admitting: Emergency Medicine

## 2016-12-03 DIAGNOSIS — I509 Heart failure, unspecified: Secondary | ICD-10-CM | POA: Diagnosis not present

## 2016-12-03 DIAGNOSIS — Z87891 Personal history of nicotine dependence: Secondary | ICD-10-CM | POA: Insufficient documentation

## 2016-12-03 DIAGNOSIS — Z79899 Other long term (current) drug therapy: Secondary | ICD-10-CM | POA: Diagnosis not present

## 2016-12-03 DIAGNOSIS — Y929 Unspecified place or not applicable: Secondary | ICD-10-CM | POA: Insufficient documentation

## 2016-12-03 DIAGNOSIS — Z7984 Long term (current) use of oral hypoglycemic drugs: Secondary | ICD-10-CM | POA: Diagnosis not present

## 2016-12-03 DIAGNOSIS — Y9389 Activity, other specified: Secondary | ICD-10-CM | POA: Insufficient documentation

## 2016-12-03 DIAGNOSIS — Z7901 Long term (current) use of anticoagulants: Secondary | ICD-10-CM | POA: Diagnosis not present

## 2016-12-03 DIAGNOSIS — X58XXXA Exposure to other specified factors, initial encounter: Secondary | ICD-10-CM | POA: Diagnosis not present

## 2016-12-03 DIAGNOSIS — Y999 Unspecified external cause status: Secondary | ICD-10-CM | POA: Diagnosis not present

## 2016-12-03 DIAGNOSIS — S01532A Puncture wound without foreign body of oral cavity, initial encounter: Secondary | ICD-10-CM | POA: Insufficient documentation

## 2016-12-03 DIAGNOSIS — I2510 Atherosclerotic heart disease of native coronary artery without angina pectoris: Secondary | ICD-10-CM | POA: Insufficient documentation

## 2016-12-03 DIAGNOSIS — I110 Hypertensive heart disease with heart failure: Secondary | ICD-10-CM | POA: Diagnosis not present

## 2016-12-03 NOTE — ED Notes (Signed)
See triage note   states he bit his tongue last pm  And it is still bleeding since last pm..

## 2016-12-03 NOTE — ED Provider Notes (Signed)
[facility] Emergency Department Provider Note  ____________________________________________   First MD Initiated Contact with Patient 12/03/16 1331     (approximate)  I have reviewed the triage vital signs and the nursing notes.   HISTORY  Chief Complaint bit tongue    HPI Chase Cabrera is a 71 y.o. male patient is here with complaint of puncture wound to his tongue. Patient states that he put his tongue last evening while eating. He states this has happened to him in the past. He is taking Coumadin and recently had his INR checked. He was not told to change the dose of his medication. Patient states that off and on his tongue has bled. He states it was not bleeding today when he ate some oatmeal but bled shortly after that.Patient states that he has a sharp point on his tooth but has not been filed down by his dentist thus far.   Past Medical History:  Diagnosis Date  . Arthritis   . Atrial fibrillation ([place])   . CAD (coronary artery disease)   . CHF (congestive heart failure) ([place])   . Chicken pox   . Corneal dystrophy    bilateral  . Diabetes ([place])   . Gout   . Hyperlipemia   . Hypertension   . Myocardial infarction    x4  . Peripheral neuropathy ([place])   . Peripheral neuropathy ([place])   . Renal artery stenosis ([place])   . Spinal stenosis     Patient Active Problem List   Diagnosis Date Noted  . Osteoarthritis 04/08/2016  . Encounter related to need for assistance due to reduced mobility 04/08/2016  . Lumbar canal stenosis 03/25/2016  . DM type 2 with diabetic peripheral neuropathy ([place]) 07/15/2014  . Gout 05/04/2013  . Long term current use of anticoagulant 07/29/2011  . Renal artery stenosis ([place]) 07/01/2011  . Atrial fibrillation ([place]) 06/30/2011  . CAD (coronary artery disease) 06/30/2011  . Hyperlipidemia 06/30/2011  . Essential hypertension 06/30/2011    Past Surgical History:  Procedure Laterality Date  . BACK SURGERY       lumbar fusion  . CARDIAC CATHETERIZATION    . CARDIAC ELECTROPHYSIOLOGY STUDY AND ABLATION    . CARDIAC SURGERY    . CARDIOVERSION    . COLONOSCOPY WITH PROPOFOL N/A 04/09/2016   Procedure: COLONOSCOPY WITH PROPOFOL;  Surgeon: Lollie Sails, MD;  Location: [facility] ENDOSCOPY;  Service: Endoscopy;  Laterality: N/A;  . CORONARY ANGIOPLASTY    . CORONARY ANGIOPLASTY WITH STENT PLACEMENT  03/28/2015   Distal 80% to normal Stent, Dilation Balloon  . EYE SURGERY     bilateral cataract  . foot and ankle repair Right 2005  . GALLBLADER    . KNEE ARTHROSCOPY Right   . RENAL ARTERY STENT Right   . ROTATOR CUFF REPAIR Right 2006  . TONSILLECTOMY    . TOTAL HIP ARTHROPLASTY Left 11/11/2015   Procedure: TOTAL HIP ARTHROPLASTY ANTERIOR APPROACH;  Surgeon: Hessie Knows, MD;  Location: [facility] ORS;  Service: Orthopedics;  Laterality: Left;    Prior to Admission medications   Medication Sig Start Date End Date Taking? Authorizing Provider  amLODipine (NORVASC) 10 MG tablet take 1 tablet by mouth once daily 11/09/16   Coral Spikes, DO  azelastine (ASTELIN) 0.1 % nasal spray Place 2 sprays into both nostrils 2 (two) times daily. Use in each nostril as directed 09/09/16   Coral Spikes, DO  carvedilol (COREG) 25 MG tablet take 1 tablet by mouth  twice a day 08/10/16   Coral Spikes, DO  clobetasol cream (TEMOVATE) 2.70 % Apply 1 application topically 2 (two) times daily. 09/28/16   Edrick Kins, DPM  clopidogrel (PLAVIX) 75 MG tablet Take 75 mg by mouth daily.    Historical Provider, MD  colchicine 0.6 MG tablet Take 1 tablet (0.6 mg total) by mouth daily. 07/20/16   Coral Spikes, DO  diclofenac sodium (VOLTAREN) 1 % GEL Apply 2 g topically 4 (four) times daily.    Historical Provider, MD  dicyclomine (BENTYL) 10 MG capsule Take 10 mg by mouth 4 (four) times daily.    Historical Provider, MD  furosemide (LASIX) 20 MG tablet take 1 tablet by mouth once daily 07/19/16   Coral Spikes, DO  gabapentin (NEURONTIN) 300  MG capsule take 1 capsule by mouth three times a day 04/19/16   Coral Spikes, DO  hydrALAZINE (APRESOLINE) 100 MG tablet take 1 tablet by mouth three times a day 07/07/16   Coral Spikes, DO  HYDROcodone-acetaminophen (NORCO/VICODIN) 5-325 MG tablet Take 1-2 tablets by mouth every 6 (six) hours as needed for moderate pain. 11/02/16   Coral Spikes, DO  insulin lispro (HUMALOG) 100 UNIT/ML KiwkPen Take 15 - 25 units up to three times daily as directed 05/03/16   Historical Provider, MD  isosorbide mononitrate (IMDUR) 120 MG 24 hr tablet take 1 tablet by mouth once daily 10/15/16   Coral Spikes, DO  LEVEMIR FLEXTOUCH 100 UNIT/ML Pen  08/24/16   Historical Provider, MD  LYRICA 75 MG capsule take 1 capsule by mouth twice a day 09/01/16   Coral Spikes, DO  metFORMIN (GLUCOPHAGE) 1000 MG tablet Take 1 tablet by mouth 2 (two) times daily. 01/01/15   Historical Provider, MD  Multiple Vitamin (MULTI-VITAMINS) TABS Take 1 tablet by mouth daily. 04/15/09   Historical Provider, MD  nitroGLYCERIN (NITROSTAT) 0.4 MG SL tablet Place 1 tablet under the tongue every 5 (five) minutes x 3 doses as needed. For chest pain.  Call 911 if no relief after 3 tablets 09/28/11 10/28/15  Historical Provider, MD  pantoprazole (PROTONIX) 40 MG tablet Take 40 mg by mouth daily.    Historical Provider, MD  potassium chloride SA (K-DUR,KLOR-CON) 20 MEQ tablet take 2 tablets by mouth twice a day 05/24/16   Coral Spikes, DO  pravastatin (PRAVACHOL) 40 MG tablet take 1 tablet by mouth once daily 11/09/16   Coral Spikes, DO  probenecid (BENEMID) 500 MG tablet Take 1 tablet by mouth 2 (two) times daily. 01/09/15   Historical Provider, MD  tamsulosin (FLOMAX) 0.4 MG CAPS capsule take 1 capsule by mouth once daily 08/10/16   Coral Spikes, DO  vitamin C (ASCORBIC ACID) 500 MG tablet Take 1,000 mg by mouth daily.    Historical Provider, MD  Vitamin E 400 units TABS Take 400 Units by mouth 2 (two) times daily.    Historical Provider, MD  warfarin (COUMADIN)  3 MG tablet take 1 tablet by mouth once daily WITH 1/2 OF 1 MG TABLET TO EQUAL 3.5 MG ONCE DAILY. 05/24/16   Coral Spikes, DO  warfarin (COUMADIN) 3 MG tablet 1 tablet  Monday, Wednesday, Friday. 11/16/16   Coral Spikes, DO  warfarin (COUMADIN) 4 MG tablet Per physician recommendations regarding dosing. 11/15/16   Coral Spikes, DO  warfarin (COUMADIN) 4 MG tablet 1 tablet Tuesday, Thursday, Saturday, and Sunday. 11/16/16   Coral Spikes, DO  Allergies Allopurinol; Atenolol; Atorvastatin; Ramipril; Simvastatin; Valsartan; Rosuvastatin; and Cardizem [diltiazem]  Family History  Problem Relation Age of Onset  . CVA Father   . Stroke Father   . Lung cancer Mother   . Arthritis Mother   . Stomach cancer Sister   . Colon cancer Sister   . Diabetes Brother     Social History Social History  Substance Use Topics  . Smoking status: Former Research scientist (life sciences)  . Smokeless tobacco: Never Used  . Alcohol use No    Review of Systems Constitutional: No fever/chills ENT: Puncture wound to tongue. Cardiovascular: Denies chest pain. Positive history for atrial fib. Respiratory: Denies shortness of breath. Gastrointestinal:   No nausea, no vomiting.  Skin: Negative for rash.   10-point ROS otherwise negative.  ____________________________________________   PHYSICAL EXAM:  VITAL SIGNS: ED Triage Vitals  Enc Vitals Group     BP 12/03/16 1253 (!) 118/54     Pulse Rate 12/03/16 1253 61     Resp 12/03/16 1253 16     Temp 12/03/16 1253 97.5 F (36.4 C)     Temp Source 12/03/16 1253 Oral     SpO2 12/03/16 1253 97 %     Weight 12/03/16 1254 190 lb (86.2 kg)     Height 12/03/16 1254 5' 8"  (1.727 m)     Head Circumference --      Peak Flow --      Pain Score --      Pain Loc --      Pain Edu? --      Excl. in [place]? --     Constitutional: Alert and oriented. Well appearing and in no acute distress. Eyes: Conjunctivae are normal. PERRL. EOMI. Head: Atraumatic. Nose: No  congestion/rhinnorhea. Mouth/Throat: Mucous membranes are moist.  Oropharynx non-erythematous. On  examination of the tongue there is a single puncture wound that is not actively bleeding. This provider used a gauze to wipe the tongue to remove some old blood and still there was no active bleeding. Neck: No stridor.   Cardiovascular: Normal rate, regular rhythm. Grossly normal heart sounds.  Good peripheral circulation. Respiratory: Normal respiratory effort.  No retractions. Lungs CTAB. Musculoskeletal: Moves upper and lower extremities without any difficulty. Neurologic:  Normal speech and language. No gross focal neurologic deficits are appreciated.  Skin:  Skin is warm, dry and intact. No rash noted. Psychiatric: Mood and affect are normal. Speech and behavior are normal.  ____________________________________________   LABS (all labs ordered are listed, but only abnormal results are displayed)  Labs Reviewed - No data to display   PROCEDURES  Procedure(s) performed: None  Procedures  Critical Care performed: No  ____________________________________________   INITIAL IMPRESSION / ASSESSMENT AND PLAN / ED COURSE  Pertinent labs & imaging results that were available during my care of the patient were reviewed by me and considered in my medical decision making (see chart for details).  ----------------------------------------- 2:54 PM on 12/03/2016 ----------------------------------------- INR was performed on 11/29/16 and was 2.0. Review of his chart shows that this has been his average INR. There continues to be no active bleeding. Patient and family member were made aware that there was no reason to suture the tongue as there is no active bleeding. Male person in the room was adamant that he get sutured. I explained that suturing the tongue where it cause more bleeding and was not necessary for a puncture wound that was not actively bleeding. Dr. Dineen Kid also saw this  patient. Patient and male  accompaniment eloped from the room prior to discharge.       ____________________________________________   FINAL CLINICAL IMPRESSION(S) / ED DIAGNOSES  Final diagnoses:  Puncture wound of tongue, initial encounter      NEW MEDICATIONS STARTED DURING THIS VISIT:  Discharge Medication List as of 12/03/2016  2:57 PM       Note:  This document was prepared using Dragon voice recognition software and may include unintentional dictation errors.    Johnn Hai, PA-C 12/03/16 [place], MD 12/03/16 [phone]

## 2016-12-03 NOTE — ED Notes (Signed)
Pt was examined by PA and Dr Clearnce Hasten .Marland Kitchen

## 2016-12-03 NOTE — ED Provider Notes (Signed)
Asked to evaluate the patient by PA Madalyn Rob.  Patient bit his tongue last night and is on Coumadin. He has had intermittent bleeding but has not bled here in the emergency department. However, the patient and his accompanying party have insisted on the patient having his tongue lacerations sewed.  He recently had an INR done on March 19 which was 2.0.  On my examination I am unable to see definitive lack but it looks more like an abrasion to the midpoint of the tongue at the midline. There is no active bleeding at this time. I agree with the assessment of Ms. Summers. The patient as well as a complaining party were frustrated/agitated with this decision. However, I feel that the risk of suturing in further bleeding is more than letting this lesion clotted on its own.   Orbie Pyo, MD 12/03/16 [phone]

## 2016-12-03 NOTE — ED Triage Notes (Signed)
Pt states that he was eating and bit his tongue. Pt is on coumadin. Pt states that this has happened previous and required stiches. Pt's tongue currently oozing blood.

## 2016-12-14 DIAGNOSIS — R933 Abnormal findings on diagnostic imaging of other parts of digestive tract: Secondary | ICD-10-CM | POA: Diagnosis not present

## 2016-12-14 DIAGNOSIS — Z7902 Long term (current) use of antithrombotics/antiplatelets: Secondary | ICD-10-CM | POA: Diagnosis not present

## 2016-12-25 ENCOUNTER — Other Ambulatory Visit: Payer: Self-pay | Admitting: Family Medicine

## 2016-12-30 ENCOUNTER — Ambulatory Visit: Payer: Medicare Other

## 2017-01-01 ENCOUNTER — Other Ambulatory Visit: Payer: Self-pay | Admitting: Family Medicine

## 2017-01-03 NOTE — Telephone Encounter (Signed)
Refilled: 07/19/16 Last OV: 11/02/16 Last Labs: 11/29/16 Future OV: 04/07/17 Please advise?

## 2017-01-06 ENCOUNTER — Other Ambulatory Visit (INDEPENDENT_AMBULATORY_CARE_PROVIDER_SITE_OTHER): Payer: Medicare Other

## 2017-01-06 DIAGNOSIS — Z7901 Long term (current) use of anticoagulants: Secondary | ICD-10-CM | POA: Diagnosis not present

## 2017-01-06 DIAGNOSIS — Z5181 Encounter for therapeutic drug level monitoring: Secondary | ICD-10-CM

## 2017-01-06 LAB — PROTIME-INR
INR: 2.6 ratio — ABNORMAL HIGH (ref 0.8–1.0)
PROTHROMBIN TIME: 28.4 s — AB (ref 9.6–13.1)

## 2017-01-07 DIAGNOSIS — Z794 Long term (current) use of insulin: Secondary | ICD-10-CM | POA: Diagnosis not present

## 2017-01-07 DIAGNOSIS — E1165 Type 2 diabetes mellitus with hyperglycemia: Secondary | ICD-10-CM | POA: Diagnosis not present

## 2017-01-13 ENCOUNTER — Ambulatory Visit (INDEPENDENT_AMBULATORY_CARE_PROVIDER_SITE_OTHER): Payer: Medicare Other | Admitting: Podiatry

## 2017-01-13 DIAGNOSIS — M2042 Other hammer toe(s) (acquired), left foot: Secondary | ICD-10-CM

## 2017-01-13 DIAGNOSIS — M2041 Other hammer toe(s) (acquired), right foot: Secondary | ICD-10-CM

## 2017-01-13 NOTE — Progress Notes (Signed)
Patient presents today for diabetic shoe measurements and inserts...Dr. Amalia Hailey reports patient has DM2, polyneuropathy, and hammertoes (2nd bilat).   Assessment today in office concur  Patient chose [employer]pex [id number].....  Size 8X measured on brannock and also current shoe size.  Angie to order and fup to PUDS 5 weeks.

## 2017-01-14 DIAGNOSIS — E1122 Type 2 diabetes mellitus with diabetic chronic kidney disease: Secondary | ICD-10-CM | POA: Diagnosis not present

## 2017-01-14 DIAGNOSIS — Z794 Long term (current) use of insulin: Secondary | ICD-10-CM | POA: Diagnosis not present

## 2017-01-14 DIAGNOSIS — G4733 Obstructive sleep apnea (adult) (pediatric): Secondary | ICD-10-CM | POA: Diagnosis not present

## 2017-01-14 DIAGNOSIS — E1142 Type 2 diabetes mellitus with diabetic polyneuropathy: Secondary | ICD-10-CM | POA: Diagnosis not present

## 2017-01-14 DIAGNOSIS — N182 Chronic kidney disease, stage 2 (mild): Secondary | ICD-10-CM | POA: Diagnosis not present

## 2017-01-14 DIAGNOSIS — R5383 Other fatigue: Secondary | ICD-10-CM | POA: Diagnosis not present

## 2017-02-01 ENCOUNTER — Other Ambulatory Visit: Payer: Self-pay | Admitting: Family Medicine

## 2017-02-08 ENCOUNTER — Other Ambulatory Visit: Payer: Medicare Other

## 2017-02-17 ENCOUNTER — Ambulatory Visit: Payer: Medicare Other

## 2017-02-21 ENCOUNTER — Other Ambulatory Visit (INDEPENDENT_AMBULATORY_CARE_PROVIDER_SITE_OTHER): Payer: Medicare Other

## 2017-02-21 DIAGNOSIS — Z7901 Long term (current) use of anticoagulants: Secondary | ICD-10-CM

## 2017-02-21 LAB — PROTIME-INR
INR: 2.6 ratio — ABNORMAL HIGH (ref 0.8–1.0)
PROTHROMBIN TIME: 27.5 s — AB (ref 9.6–13.1)

## 2017-03-03 ENCOUNTER — Ambulatory Visit: Payer: Medicare Other

## 2017-03-04 DIAGNOSIS — G4739 Other sleep apnea: Secondary | ICD-10-CM | POA: Diagnosis not present

## 2017-03-04 DIAGNOSIS — I480 Paroxysmal atrial fibrillation: Secondary | ICD-10-CM | POA: Diagnosis not present

## 2017-03-04 DIAGNOSIS — I10 Essential (primary) hypertension: Secondary | ICD-10-CM | POA: Diagnosis not present

## 2017-03-04 DIAGNOSIS — I2510 Atherosclerotic heart disease of native coronary artery without angina pectoris: Secondary | ICD-10-CM | POA: Diagnosis not present

## 2017-03-07 ENCOUNTER — Encounter: Payer: Self-pay | Admitting: Family Medicine

## 2017-03-07 ENCOUNTER — Ambulatory Visit (INDEPENDENT_AMBULATORY_CARE_PROVIDER_SITE_OTHER): Payer: Medicare Other | Admitting: Family Medicine

## 2017-03-07 DIAGNOSIS — R2681 Unsteadiness on feet: Secondary | ICD-10-CM

## 2017-03-07 DIAGNOSIS — I2510 Atherosclerotic heart disease of native coronary artery without angina pectoris: Secondary | ICD-10-CM | POA: Diagnosis not present

## 2017-03-07 DIAGNOSIS — R0782 Intercostal pain: Secondary | ICD-10-CM

## 2017-03-07 DIAGNOSIS — R0781 Pleurodynia: Secondary | ICD-10-CM | POA: Insufficient documentation

## 2017-03-07 NOTE — Patient Instructions (Signed)
We will call with the PT appt.  Take care  Dr. Lacinda Axon

## 2017-03-07 NOTE — Assessment & Plan Note (Signed)
New problem. MSK in origin. Sending to PT.

## 2017-03-07 NOTE — Progress Notes (Signed)
Subjective:  Patient ID: Chase Cabrera, male    DOB: 1946/04/17  Age: 71 y.o. MRN: [id number]  CC: Gait instability/neuropathy, chest/rib pain  HPI:  71 year old male with an extensive past medical history including coronary artery disease status post CABG, age or fibrillation, hypertension, DM 2 with neuropathy, gout, hyperlipidemia, low back pain/lumbar stenosis presents with the above complaints.  Patient states that for the past 3 weeks his neuropathy has been worsening. Pain is worse intermittently. He states that his primary concern is the fact that he feels that he is developing right foot drop. He has difficulty ambulating secondary to his neuropathy. He feels as if he is going to fall. He does use a walker which helps but he has significant difficulty ambulating.  Additionally, patient reports a 3 day history of worsening right anterior rib pain (middle, just below the level of the nipple). No known inciting factor. Patient states that he's had this before and had a mammogram which was unrevealing. Pain is severe at times. Radiates towards the back. Worse with certain movements. No known relieving factors. No reports of chest pain or increasing shortness of breath. No other complaints of concerns this time.  Social Hx   Social History   Social History  . Marital status: Married    Spouse name: N/A  . Number of children: N/A  . Years of education: N/A   Social History Main Topics  . Smoking status: Former Research scientist (life sciences)  . Smokeless tobacco: Never Used  . Alcohol use No  . Drug use: No  . Sexual activity: Not Asked   Other Topics Concern  . None   Social History Narrative  . None    Review of Systems  Constitutional: Negative.   Musculoskeletal: Positive for arthralgias and gait problem.   Objective:  BP 122/62 (BP Location: Left Arm, Patient Position: Sitting, Cuff Size: Large)   Pulse (!) 59   Temp 98.3 F (36.8 C) (Oral)   Resp 18   Wt 203 lb 2 oz (92.1 kg)    SpO2 93%   BMI 30.89 kg/m   BP/Weight 03/07/2017 12/03/2016 2/40/9735  Systolic BP 329 924 268  Diastolic BP 62 54 66  Wt. (Lbs) 203.13 190 204.2  BMI 30.89 28.89 32.71   Physical Exam  Constitutional: He is oriented to person, place, and time. He appears well-developed. No distress.  Cardiovascular: Normal rate and regular rhythm.   Pulmonary/Chest: Effort normal and breath sounds normal. He has no wheezes. He has no rales.  Abdominal: Soft. He exhibits no distension. There is no tenderness.  Musculoskeletal:  Mild tenderness of the ribs just below the nipple and the midclavicular line.  Neurological: He is alert and oriented to person, place, and time.  Psychiatric: He has a normal mood and affect.  Vitals reviewed.   Lab Results  Component Value Date   WBC 7.5 04/09/2016   HGB 13.8 04/09/2016   HCT 39.7 (L) 04/09/2016   PLT 279 04/09/2016   GLUCOSE 134 (H) 03/24/2016   CHOL 124 03/24/2016   TRIG 389.0 (H) 03/24/2016   HDL 25.60 (L) 03/24/2016   LDLDIRECT 51.0 03/24/2016   ALT 27 03/24/2016   AST 23 03/24/2016   NA 136 03/24/2016   K 4.3 03/24/2016   CL 102 03/24/2016   CREATININE 0.94 03/24/2016   BUN 18 03/24/2016   CO2 24 03/24/2016   INR 2.6 (H) 02/21/2017    Assessment & Plan:   Problem List Items Addressed This Visit  Other   Gait instability    New problem. Sending to PT.      Relevant Orders   Ambulatory referral to Physical Therapy   Intercostal pain    New problem. MSK in origin. Sending to PT.      Relevant Orders   Ambulatory referral to Physical Therapy     Follow-up: Return in about 3 months (around 06/07/2017).  [place]

## 2017-03-07 NOTE — Assessment & Plan Note (Signed)
New problem. Sending to PT.

## 2017-03-11 ENCOUNTER — Other Ambulatory Visit: Payer: Self-pay | Admitting: Family Medicine

## 2017-03-11 ENCOUNTER — Telehealth: Payer: Self-pay

## 2017-03-11 NOTE — Telephone Encounter (Signed)
His issues are urinary frequencey and unable to control when and were he voids.  Patient is currently wearing depends due to [place].

## 2017-03-11 NOTE — Telephone Encounter (Signed)
What kind of issues is he having?

## 2017-03-11 NOTE — Telephone Encounter (Signed)
Patients wife is requesting if her husband can receive a referral to Urology due to his Prostate issues. Please advise

## 2017-03-15 ENCOUNTER — Other Ambulatory Visit: Payer: Self-pay | Admitting: Family Medicine

## 2017-03-15 DIAGNOSIS — R32 Unspecified urinary incontinence: Secondary | ICD-10-CM

## 2017-03-17 DIAGNOSIS — R2681 Unsteadiness on feet: Secondary | ICD-10-CM | POA: Diagnosis not present

## 2017-03-17 DIAGNOSIS — M6281 Muscle weakness (generalized): Secondary | ICD-10-CM | POA: Diagnosis not present

## 2017-03-18 DIAGNOSIS — N182 Chronic kidney disease, stage 2 (mild): Secondary | ICD-10-CM | POA: Diagnosis not present

## 2017-03-18 DIAGNOSIS — E1142 Type 2 diabetes mellitus with diabetic polyneuropathy: Secondary | ICD-10-CM | POA: Diagnosis not present

## 2017-03-18 DIAGNOSIS — E1122 Type 2 diabetes mellitus with diabetic chronic kidney disease: Secondary | ICD-10-CM | POA: Diagnosis not present

## 2017-03-18 DIAGNOSIS — E1165 Type 2 diabetes mellitus with hyperglycemia: Secondary | ICD-10-CM | POA: Diagnosis not present

## 2017-03-18 DIAGNOSIS — Z794 Long term (current) use of insulin: Secondary | ICD-10-CM | POA: Diagnosis not present

## 2017-03-23 ENCOUNTER — Other Ambulatory Visit: Payer: Medicare Other

## 2017-03-25 DIAGNOSIS — R2681 Unsteadiness on feet: Secondary | ICD-10-CM | POA: Diagnosis not present

## 2017-03-25 DIAGNOSIS — M6281 Muscle weakness (generalized): Secondary | ICD-10-CM | POA: Diagnosis not present

## 2017-03-28 ENCOUNTER — Other Ambulatory Visit: Payer: Self-pay | Admitting: Radiology

## 2017-03-28 ENCOUNTER — Other Ambulatory Visit (INDEPENDENT_AMBULATORY_CARE_PROVIDER_SITE_OTHER): Payer: Medicare Other

## 2017-03-28 DIAGNOSIS — Z7901 Long term (current) use of anticoagulants: Principal | ICD-10-CM

## 2017-03-28 DIAGNOSIS — Z5181 Encounter for therapeutic drug level monitoring: Secondary | ICD-10-CM

## 2017-03-30 DIAGNOSIS — R2681 Unsteadiness on feet: Secondary | ICD-10-CM | POA: Diagnosis not present

## 2017-03-30 DIAGNOSIS — M6281 Muscle weakness (generalized): Secondary | ICD-10-CM | POA: Diagnosis not present

## 2017-03-31 ENCOUNTER — Ambulatory Visit (INDEPENDENT_AMBULATORY_CARE_PROVIDER_SITE_OTHER): Payer: Self-pay | Admitting: Podiatry

## 2017-03-31 DIAGNOSIS — E1142 Type 2 diabetes mellitus with diabetic polyneuropathy: Secondary | ICD-10-CM

## 2017-03-31 DIAGNOSIS — R2681 Unsteadiness on feet: Secondary | ICD-10-CM | POA: Diagnosis not present

## 2017-03-31 DIAGNOSIS — M6281 Muscle weakness (generalized): Secondary | ICD-10-CM | POA: Diagnosis not present

## 2017-03-31 DIAGNOSIS — M2041 Other hammer toe(s) (acquired), right foot: Secondary | ICD-10-CM

## 2017-03-31 DIAGNOSIS — M2042 Other hammer toe(s) (acquired), left foot: Secondary | ICD-10-CM

## 2017-03-31 NOTE — Progress Notes (Signed)
Patient ID: Chase Cabrera, male   DOB: 1945-11-03, 71 y.o.   MRN: [id number] Patient presents for diabetic shoe pick up, shoes are tried on for good fit.  Patient received 1 Pair and 3 pairs custom molded diabetic inserts.  Verbal and written break in and wear instructions given.  Patient will follow up for scheduled routine care.

## 2017-03-31 NOTE — Patient Instructions (Signed)

## 2017-04-04 ENCOUNTER — Other Ambulatory Visit (INDEPENDENT_AMBULATORY_CARE_PROVIDER_SITE_OTHER): Payer: Medicare Other

## 2017-04-04 DIAGNOSIS — Z7901 Long term (current) use of anticoagulants: Secondary | ICD-10-CM

## 2017-04-04 DIAGNOSIS — Z5181 Encounter for therapeutic drug level monitoring: Secondary | ICD-10-CM

## 2017-04-04 DIAGNOSIS — R2681 Unsteadiness on feet: Secondary | ICD-10-CM | POA: Diagnosis not present

## 2017-04-04 DIAGNOSIS — M6281 Muscle weakness (generalized): Secondary | ICD-10-CM | POA: Diagnosis not present

## 2017-04-04 LAB — PROTIME-INR
INR: 2.2 ratio — ABNORMAL HIGH (ref 0.8–1.0)
Prothrombin Time: 23.8 s — ABNORMAL HIGH (ref 9.6–13.1)

## 2017-04-06 DIAGNOSIS — M6281 Muscle weakness (generalized): Secondary | ICD-10-CM | POA: Diagnosis not present

## 2017-04-06 DIAGNOSIS — R2681 Unsteadiness on feet: Secondary | ICD-10-CM | POA: Diagnosis not present

## 2017-04-07 ENCOUNTER — Ambulatory Visit: Payer: Medicare Other | Admitting: Family Medicine

## 2017-04-07 DIAGNOSIS — J310 Chronic rhinitis: Secondary | ICD-10-CM | POA: Diagnosis not present

## 2017-04-07 DIAGNOSIS — J329 Chronic sinusitis, unspecified: Secondary | ICD-10-CM | POA: Diagnosis not present

## 2017-04-11 ENCOUNTER — Encounter: Payer: Self-pay | Admitting: *Deleted

## 2017-04-12 ENCOUNTER — Encounter: Payer: Self-pay | Admitting: Certified Registered"

## 2017-04-12 ENCOUNTER — Encounter
Admission: RE | Disposition: A | Discharge status: Home / Self Care | Payer: Self-pay | Source: Ambulatory Visit | Attending: Gastroenterology

## 2017-04-12 ENCOUNTER — Ambulatory Visit
Admission: RE | Admit: 2017-04-12 | Discharge: 2017-04-12 | Disposition: A | Discharge status: Home / Self Care | Payer: Medicare Other | Source: Ambulatory Visit | Attending: Gastroenterology | Admitting: Gastroenterology

## 2017-04-12 ENCOUNTER — Encounter: Payer: Self-pay | Admitting: *Deleted

## 2017-04-12 DIAGNOSIS — R7309 Other abnormal glucose: Secondary | ICD-10-CM | POA: Diagnosis not present

## 2017-04-12 DIAGNOSIS — K5090 Crohn's disease, unspecified, without complications: Secondary | ICD-10-CM | POA: Insufficient documentation

## 2017-04-12 DIAGNOSIS — R791 Abnormal coagulation profile: Secondary | ICD-10-CM | POA: Insufficient documentation

## 2017-04-12 DIAGNOSIS — Z5309 Procedure and treatment not carried out because of other contraindication: Secondary | ICD-10-CM | POA: Insufficient documentation

## 2017-04-12 HISTORY — DX: Cardiac arrhythmia, unspecified: I49.9

## 2017-04-12 HISTORY — DX: Gastro-esophageal reflux disease without esophagitis: K21.9

## 2017-04-12 HISTORY — DX: Nodular corneal degeneration, unspecified eye: H18.459

## 2017-04-12 HISTORY — DX: Personal history of other infectious and parasitic diseases: Z86.19

## 2017-04-12 HISTORY — DX: Peptic ulcer, site unspecified, unspecified as acute or chronic, without hemorrhage or perforation: K27.9

## 2017-04-12 LAB — CBC WITH DIFFERENTIAL/PLATELET
BASOS ABS: 0 10*3/uL (ref 0–0.1)
BASOS PCT: 1 %
EOS ABS: 0.2 10*3/uL (ref 0–0.7)
EOS PCT: 4 %
HCT: 39.9 % — ABNORMAL LOW (ref 40.0–52.0)
HEMOGLOBIN: 13.8 g/dL (ref 13.0–18.0)
Lymphocytes Relative: 24 %
Lymphs Abs: 1.5 10*3/uL (ref 1.0–3.6)
MCH: 33.3 pg (ref 26.0–34.0)
MCHC: 34.6 g/dL (ref 32.0–36.0)
MCV: 96.1 fL (ref 80.0–100.0)
Monocytes Absolute: 0.7 10*3/uL (ref 0.2–1.0)
Monocytes Relative: 10 %
NEUTROS PCT: 61 %
Neutro Abs: 3.9 10*3/uL (ref 1.4–6.5)
PLATELETS: 208 10*3/uL (ref 150–440)
RBC: 4.16 MIL/uL — AB (ref 4.40–5.90)
RDW: 14.1 % (ref 11.5–14.5)
WBC: 6.4 10*3/uL (ref 3.8–10.6)

## 2017-04-12 LAB — PROTIME-INR
INR: 1.54
PROTHROMBIN TIME: 18.6 s — AB (ref 11.4–15.2)

## 2017-04-12 LAB — GLUCOSE, CAPILLARY: GLUCOSE-CAPILLARY: 164 mg/dL — AB (ref 65–99)

## 2017-04-12 SURGERY — COLONOSCOPY WITH PROPOFOL
Anesthesia: General

## 2017-04-12 MED ORDER — PROPOFOL 500 MG/50ML IV EMUL
INTRAVENOUS | Status: AC
  Filled 2017-04-12: qty 50

## 2017-04-12 MED ORDER — SODIUM CHLORIDE 0.9 % IV SOLN: INTRAVENOUS | Status: DC

## 2017-04-12 MED ORDER — MIDAZOLAM HCL 2 MG/2ML IJ SOLN
INTRAMUSCULAR | Status: AC
  Filled 2017-04-12: qty 2

## 2017-04-12 NOTE — H&P (Signed)
Patient is a 71 year old male presenting today in regards to his personal history of Crohn's disease for colonoscopy. However he also does take Coumadin. On check of his pro time today this was above a normal limits that we would use for elective procedure. As such we will arrange for him to be rescheduled for this. We will let him have full liquids today clear liquids tonight and repeat her prep this evening for tomorrow case. Discussed with patient.

## 2017-04-13 ENCOUNTER — Ambulatory Visit: Payer: Medicare Other | Admitting: Anesthesiology

## 2017-04-13 ENCOUNTER — Encounter: Payer: Self-pay | Admitting: *Deleted

## 2017-04-13 ENCOUNTER — Encounter
Admission: RE | Disposition: A | Discharge status: Home / Self Care | Payer: Self-pay | Source: Ambulatory Visit | Attending: Gastroenterology

## 2017-04-13 ENCOUNTER — Ambulatory Visit
Admission: RE | Admit: 2017-04-13 | Discharge: 2017-04-13 | Disposition: A | Discharge status: Home / Self Care | Payer: Medicare Other | Source: Ambulatory Visit | Attending: Gastroenterology | Admitting: Gastroenterology

## 2017-04-13 DIAGNOSIS — K5730 Diverticulosis of large intestine without perforation or abscess without bleeding: Secondary | ICD-10-CM | POA: Diagnosis not present

## 2017-04-13 DIAGNOSIS — K644 Residual hemorrhoidal skin tags: Secondary | ICD-10-CM | POA: Diagnosis not present

## 2017-04-13 DIAGNOSIS — E785 Hyperlipidemia, unspecified: Secondary | ICD-10-CM | POA: Insufficient documentation

## 2017-04-13 DIAGNOSIS — I701 Atherosclerosis of renal artery: Secondary | ICD-10-CM | POA: Diagnosis not present

## 2017-04-13 DIAGNOSIS — Z8711 Personal history of peptic ulcer disease: Secondary | ICD-10-CM | POA: Insufficient documentation

## 2017-04-13 DIAGNOSIS — I4891 Unspecified atrial fibrillation: Secondary | ICD-10-CM | POA: Diagnosis not present

## 2017-04-13 DIAGNOSIS — Z87891 Personal history of nicotine dependence: Secondary | ICD-10-CM | POA: Diagnosis not present

## 2017-04-13 DIAGNOSIS — Z79899 Other long term (current) drug therapy: Secondary | ICD-10-CM | POA: Diagnosis not present

## 2017-04-13 DIAGNOSIS — I110 Hypertensive heart disease with heart failure: Secondary | ICD-10-CM | POA: Diagnosis not present

## 2017-04-13 DIAGNOSIS — Z888 Allergy status to other drugs, medicaments and biological substances status: Secondary | ICD-10-CM | POA: Diagnosis not present

## 2017-04-13 DIAGNOSIS — I2510 Atherosclerotic heart disease of native coronary artery without angina pectoris: Secondary | ICD-10-CM | POA: Diagnosis not present

## 2017-04-13 DIAGNOSIS — M109 Gout, unspecified: Secondary | ICD-10-CM | POA: Diagnosis not present

## 2017-04-13 DIAGNOSIS — I509 Heart failure, unspecified: Secondary | ICD-10-CM | POA: Diagnosis not present

## 2017-04-13 DIAGNOSIS — R194 Change in bowel habit: Secondary | ICD-10-CM | POA: Diagnosis not present

## 2017-04-13 DIAGNOSIS — Z794 Long term (current) use of insulin: Secondary | ICD-10-CM | POA: Diagnosis not present

## 2017-04-13 DIAGNOSIS — I252 Old myocardial infarction: Secondary | ICD-10-CM | POA: Diagnosis not present

## 2017-04-13 DIAGNOSIS — Q438 Other specified congenital malformations of intestine: Secondary | ICD-10-CM | POA: Diagnosis not present

## 2017-04-13 DIAGNOSIS — M1990 Unspecified osteoarthritis, unspecified site: Secondary | ICD-10-CM | POA: Diagnosis not present

## 2017-04-13 DIAGNOSIS — R197 Diarrhea, unspecified: Secondary | ICD-10-CM | POA: Diagnosis not present

## 2017-04-13 DIAGNOSIS — I10 Essential (primary) hypertension: Secondary | ICD-10-CM | POA: Diagnosis not present

## 2017-04-13 DIAGNOSIS — K529 Noninfective gastroenteritis and colitis, unspecified: Secondary | ICD-10-CM | POA: Diagnosis not present

## 2017-04-13 DIAGNOSIS — K219 Gastro-esophageal reflux disease without esophagitis: Secondary | ICD-10-CM | POA: Insufficient documentation

## 2017-04-13 DIAGNOSIS — K648 Other hemorrhoids: Secondary | ICD-10-CM | POA: Diagnosis not present

## 2017-04-13 DIAGNOSIS — E1142 Type 2 diabetes mellitus with diabetic polyneuropathy: Secondary | ICD-10-CM | POA: Insufficient documentation

## 2017-04-13 DIAGNOSIS — Z7901 Long term (current) use of anticoagulants: Secondary | ICD-10-CM | POA: Diagnosis not present

## 2017-04-13 DIAGNOSIS — Z8619 Personal history of other infectious and parasitic diseases: Secondary | ICD-10-CM | POA: Insufficient documentation

## 2017-04-13 LAB — PROTIME-INR
INR: 1.25
PROTHROMBIN TIME: 15.8 s — AB (ref 11.4–15.2)

## 2017-04-13 SURGERY — COLONOSCOPY WITH PROPOFOL
Anesthesia: General

## 2017-04-13 MED ORDER — EPHEDRINE SULFATE 50 MG/ML IJ SOLN
INTRAMUSCULAR | Status: DC | PRN
  Administered 2017-04-13: 5 mg via INTRAVENOUS
  Administered 2017-04-13: 10 mg via INTRAVENOUS
  Administered 2017-04-13: 5 mg via INTRAVENOUS
  Administered 2017-04-13: 10 mg via INTRAVENOUS

## 2017-04-13 MED ORDER — PROPOFOL 10 MG/ML IV BOLUS
INTRAVENOUS | Status: AC
  Filled 2017-04-13: qty 20

## 2017-04-13 MED ORDER — FENTANYL CITRATE (PF) 100 MCG/2ML IJ SOLN
INTRAMUSCULAR | Status: DC | PRN
  Administered 2017-04-13: 50 ug via INTRAVENOUS

## 2017-04-13 MED ORDER — MIDAZOLAM HCL 2 MG/2ML IJ SOLN
INTRAMUSCULAR | Status: AC
  Filled 2017-04-13: qty 2

## 2017-04-13 MED ORDER — PROPOFOL 500 MG/50ML IV EMUL
INTRAVENOUS | Status: DC | PRN
  Administered 2017-04-13: 120 ug/kg/min via INTRAVENOUS

## 2017-04-13 MED ORDER — SODIUM CHLORIDE 0.9 % IV SOLN
INTRAVENOUS | Status: DC
  Administered 2017-04-13: 12:00:00 via INTRAVENOUS

## 2017-04-13 MED ORDER — MIDAZOLAM HCL 2 MG/2ML IJ SOLN
INTRAMUSCULAR | Status: DC | PRN
  Administered 2017-04-13: 1 mg via INTRAVENOUS

## 2017-04-13 MED ORDER — SODIUM CHLORIDE 0.9 % IV SOLN
INTRAVENOUS | Status: DC
  Administered 2017-04-13: 1000 mL via INTRAVENOUS

## 2017-04-13 MED ORDER — EPHEDRINE SULFATE 50 MG/ML IJ SOLN
INTRAMUSCULAR | Status: AC
  Filled 2017-04-13: qty 1

## 2017-04-13 MED ORDER — PROPOFOL 500 MG/50ML IV EMUL
INTRAVENOUS | Status: AC
  Filled 2017-04-13: qty 50

## 2017-04-13 MED ORDER — FENTANYL CITRATE (PF) 100 MCG/2ML IJ SOLN
INTRAMUSCULAR | Status: AC
  Filled 2017-04-13: qty 2

## 2017-04-13 NOTE — Op Note (Signed)
[facility] Patient Name: Chase Cabrera Procedure Date: 04/13/2017 12:00 PM MRN: [id number] Account #: [id number] Date of Birth: [birth year] Admit Type: Outpatient Age: 71 Room: [facility] ENDO ROOM 1 Gender: Male Note Status: Finalized Procedure:            Colonoscopy Indications:          Chronic diarrhea, Suspected ulcerative colitis, Change                        in bowel habits Providers:            Lollie Sails, MD Referring MD:         Barnie Del. Lacinda Axon MD, MD (Referring MD) Medicines:            Monitored Anesthesia Care Complications:        No immediate complications. Procedure:            Pre-Anesthesia Assessment:                       - ASA Grade Assessment: III - A patient with severe                        systemic disease.                       After obtaining informed consent, the colonoscope was                        passed under direct vision. Throughout the procedure,                        the patient's blood pressure, pulse, and oxygen                        saturations were monitored continuously. The                        Colonoscope was introduced through the anus and                        advanced to the the terminal ileum. The colonoscopy was                        unusually difficult due to poor bowel prep and                        significant looping. Successful completion of the                        procedure was aided by changing the patient to a prone                        position, using manual pressure and lavage. The patient                        tolerated the procedure well. The quality of the bowel                        preparation was fair. Findings:      Multiple small-mouthed diverticula were found in the sigmoid colon and  descending colon.      The exam was otherwise normal throughout the examined colon.      The terminal ileum contained a single small diverticulum.      The terminal ileum  appeared normal otherwise. Biopsies were taken with a       cold forceps for histology.      Biopsies for histology were taken with a cold forceps from the cecum,       ascending colon, transverse colon, descending colon, sigmoid colon and       rectum for evaluation of microscopic colitis.      The retroflexed view of the distal rectum and anal verge was normal and       showed no anal or rectal abnormalities. Impression:           - Preparation of the colon was fair.                       - Diverticulosis in the sigmoid colon and in the                        descending colon.                       - Ileal diverticulum.                       - The examined portion of the ileum was normal.                        Biopsied.                       - The distal rectum and anal verge are normal on                        retroflexion view.                       - Biopsies were taken with a cold forceps from the                        cecum, ascending colon, transverse colon, descending                        colon, sigmoid colon and rectum for evaluation of                        microscopic colitis. Recommendation:       - Discharge patient to home.                       - Perform a small bowel follow through at appointment                        to be scheduled, re evaluate for diverticulosis of the                        small bowel.                       - Return to GI clinic in 3 weeks. Procedure Code(s):    --- Professional ---  45380, Colonoscopy, flexible; with biopsy, single or                        multiple Diagnosis Code(s):    --- Professional ---                       K52.9, Noninfective gastroenteritis and colitis,                        unspecified                       R19.4, Change in bowel habit                       K57.50, Diverticulosis of both small and large                        intestine without perforation or abscess without                         bleeding CPT copyright 2016 American Medical Association. All rights reserved. The codes documented in this report are preliminary and upon coder review may  be revised to meet current compliance requirements. Lollie Sails, MD 04/13/2017 1:24:29 PM This report has been signed electronically. Number of Addenda: 0 Note Initiated On: 04/13/2017 12:00 PM Scope Withdrawal Time: 0 hours 36 minutes 47 seconds  Total Procedure Duration: 0 hours 57 minutes 8 seconds       [facility]

## 2017-04-13 NOTE — Anesthesia Post-op Follow-up Note (Cosign Needed)
Anesthesia QCDR form completed.        

## 2017-04-13 NOTE — Anesthesia Postprocedure Evaluation (Signed)
Anesthesia Post Note  Patient: Chase Cabrera  Procedure(s) Performed: Procedure(s) (LRB): COLONOSCOPY WITH PROPOFOL (N/A)  Patient location during evaluation: Endoscopy Anesthesia Type: General Level of consciousness: awake and alert Pain management: pain level controlled Vital Signs Assessment: post-procedure vital signs reviewed and stable Respiratory status: spontaneous breathing, nonlabored ventilation, respiratory function stable and patient connected to nasal cannula oxygen Cardiovascular status: blood pressure returned to baseline and stable Postop Assessment: no signs of nausea or vomiting Anesthetic complications: no     Last Vitals:  Vitals:   04/13/17 1108 04/13/17 1319  BP:  (!) 111/58  Pulse: (!) 58 (!) 44  Resp: 20 20  Temp:  (!) 36.2 C    Last Pain:  Vitals:   04/13/17 1319  TempSrc: Tympanic                 Janai Maudlin S

## 2017-04-13 NOTE — Anesthesia Preprocedure Evaluation (Signed)
Anesthesia Evaluation  Patient identified by MRN, date of birth, ID band Patient awake    Reviewed: Allergy & Precautions, NPO status , Patient's Chart, lab work & pertinent test results, reviewed documented beta blocker date and time   Airway Mallampati: III  TM Distance: >3 FB     Dental  (+) Chipped   Pulmonary former smoker,           Cardiovascular hypertension, Pt. on medications and Pt. on home beta blockers + CAD, + Past MI, + Cardiac Stents, + Peripheral Vascular Disease and +CHF  + dysrhythmias Atrial Fibrillation      Neuro/Psych  Neuromuscular disease    GI/Hepatic PUD, GERD  ,  Endo/Other  diabetes, Type 2  Renal/GU Renal disease     Musculoskeletal  (+) Arthritis ,   Abdominal   Peds  Hematology   Anesthesia Other Findings Gout.  Reproductive/Obstetrics                             Anesthesia Physical Anesthesia Plan  ASA: III  Anesthesia Plan: General   Post-op Pain Management:    Induction: Intravenous  PONV Risk Score and Plan:   Airway Management Planned:   Additional Equipment:   Intra-op Plan:   Post-operative Plan:   Informed Consent: I have reviewed the patients History and Physical, chart, labs and discussed the procedure including the risks, benefits and alternatives for the proposed anesthesia with the patient or authorized representative who has indicated his/her understanding and acceptance.     Plan Discussed with: CRNA  Anesthesia Plan Comments:         Anesthesia Quick Evaluation

## 2017-04-13 NOTE — Transfer of Care (Signed)
Immediate Anesthesia Transfer of Care Note  Patient: Chase Cabrera  Procedure(s) Performed: Procedure(s): COLONOSCOPY WITH PROPOFOL (N/A)  Patient Location: PACU  Anesthesia Type:General  Level of Consciousness: awake and sedated  Airway & Oxygen Therapy: Patient Spontanous Breathing and Patient connected to nasal cannula oxygen  Post-op Assessment: Report given to RN and Post -op Vital signs reviewed and stable  Post vital signs: Reviewed and stable  Last Vitals:  Vitals:   04/13/17 1102 04/13/17 1108  BP: (!) 107/57   Pulse:  (!) 58  Resp:  20  Temp: (!) 36.2 C     Last Pain:  Vitals:   04/13/17 1102  TempSrc: Tympanic         Complications: No apparent anesthesia complications

## 2017-04-13 NOTE — H&P (Signed)
Outpatient short stay form Pre-procedure 04/13/2017 11:59 AM Chase Cabrera  Primary Physician: Dr. Jason Fila  Reason for visit:  Colonoscopy  History of present illness:  Patient is a 71 year old male presenting today as above. A colonoscopy on 04/09/2016. This procedure was done for change of bowel habits and diarrhea. Random biopsies in the right and left colons indicated some evidence of possible inflammatory bowel disease and serologies were consistent with Crohn's. Since that time he has been on mesalamine. He is presenting today for recheck. The mesalamine his been taking is not seem to have made a difference in stabilizing his bowel habits. He continues to have variable bowel habits mostly loose. He has seen no blood or mucus in the stools.    Current Facility-Administered Medications:  .  0.9 %  sodium chloride infusion, , Intravenous, Continuous, Chase Sails, Cabrera .  0.9 %  sodium chloride infusion, , Intravenous, Continuous, Chase Sails, Cabrera  Prescriptions Prior to Admission  Medication Sig Dispense Refill Last Dose  . carvedilol (COREG) 25 MG tablet take 1 tablet by mouth twice a day 60 tablet 9 04/13/2017 at Unknown time  . dicyclomine (BENTYL) 10 MG capsule Take 10 mg by mouth 4 (four) times daily.   04/13/2017 at Unknown time  . doxycycline (VIBRAMYCIN) 100 MG capsule Take 100 mg by mouth 2 (two) times daily.   04/13/2017 at Unknown time  . gabapentin (NEURONTIN) 300 MG capsule take 1 capsule by mouth three times a day 90 capsule 1 04/13/2017 at Unknown time  . hydrALAZINE (APRESOLINE) 100 MG tablet take 1 tablet by mouth three times a day 90 tablet 5 04/13/2017 at Unknown time  . isosorbide mononitrate (IMDUR) 120 MG 24 hr tablet take 1 tablet by mouth once daily 90 tablet 2 04/13/2017 at Unknown time  . metFORMIN (GLUCOPHAGE) 1000 MG tablet Take 1 tablet by mouth 2 (two) times daily.  0 Past Week at Unknown time  . omeprazole (PRILOSEC) 40 MG capsule Take 40 mg by mouth  daily.   04/13/2017 at Unknown time  . tamsulosin (FLOMAX) 0.4 MG CAPS capsule take 1 capsule by mouth once daily 30 capsule 9 Past Week at Unknown time  . vitamin C (ASCORBIC ACID) 500 MG tablet Take 1,000 mg by mouth daily.   Past Week at Unknown time  . Vitamin E 400 units TABS Take 400 Units by mouth 2 (two) times daily.   Past Week at Unknown time  . warfarin (COUMADIN) 3 MG tablet 1 tablet  Monday, Wednesday, Friday. 90 tablet 3 Past Week at Unknown time  . warfarin (COUMADIN) 4 MG tablet 1 tablet Tuesday, Thursday, Saturday, and Sunday. 90 tablet 3 Past Week at Unknown time  . amLODipine (NORVASC) 10 MG tablet Take 10 mg by mouth daily.   04/11/2017 at Unknown time  . azelastine (ASTELIN) 0.1 % nasal spray Place 2 sprays into both nostrils 2 (two) times daily. Use in each nostril as directed 30 mL 1 04/11/2017 at Unknown time  . cetirizine (ZYRTEC) 10 MG tablet Take 10 mg by mouth daily.   Not Taking at Unknown time  . clopidogrel (PLAVIX) 75 MG tablet Take 75 mg by mouth daily.   04/01/2017  . colchicine 0.6 MG tablet take 1 tablet by mouth once daily 60 tablet 0 04/11/2017 at Unknown time  . diclofenac sodium (VOLTAREN) 1 % GEL Apply 2 g topically 4 (four) times daily.   Past Month at Unknown time  . furosemide (LASIX) 20  MG tablet take 1 tablet by mouth once daily 30 tablet 5 04/11/2017 at Unknown time  . guaiFENesin (MUCINEX) 600 MG 12 hr tablet Take by mouth 2 (two) times daily.     Marland Kitchen HYDROcodone-acetaminophen (NORCO) 10-325 MG tablet Take 1 tablet by mouth every 6 (six) hours as needed.   Past Month at Unknown time  . HYDROcodone-homatropine (HYCODAN) 5-1.5 MG/5ML syrup Take 5 mLs by mouth every 6 (six) hours as needed for cough.   Past Month at Unknown time  . insulin lispro (HUMALOG) 100 UNIT/ML KiwkPen Take 15 - 25 units up to three times daily as directed   04/12/2017 at Unknown time  . LEVEMIR FLEXTOUCH 100 UNIT/ML Pen   1 Taking  . mesalamine (LIALDA) 1.2 g EC tablet Take 1.2 g by mouth  daily with breakfast.   04/11/2017 at Unknown time  . Multiple Vitamin (MULTI-VITAMINS) TABS Take 1 tablet by mouth daily.   04/08/2017  . nitroGLYCERIN (NITROSTAT) 0.4 MG SL tablet Place 1 tablet under the tongue every 5 (five) minutes x 3 doses as needed. For chest pain.  Call 911 if no relief after 3 tablets     . potassium chloride SA (K-DUR,KLOR-CON) 20 MEQ tablet take 2 tablets by mouth twice a day 360 tablet 1 04/11/2017 at Unknown time  . pravastatin (PRAVACHOL) 40 MG tablet take 1 tablet by mouth once daily 90 tablet 2 04/11/2017 at Unknown time  . probenecid (BENEMID) 500 MG tablet Take 1 tablet by mouth 2 (two) times daily.  0 04/11/2017 at Unknown time     Allergies  Allergen Reactions  . Allopurinol Diarrhea, Other (See Comments) and Nausea And Vomiting    Other Reaction: GI Upset  . Atenolol Other (See Comments)    Other Reaction: bradycardia  . Atorvastatin Other (See Comments)    Other Reaction: muscle aches  . Ramipril Rash  . Simvastatin Other (See Comments)    Other Reaction: MUSCLE ACHES (Zocor)  . Valsartan Other (See Comments)    Other Reaction: facial swelling  . Rosuvastatin Other (See Comments)    Muscle aches  . Cardizem [Diltiazem] Rash     Past Medical History:  Diagnosis Date  . Arthritis   . Atrial fibrillation ([place])   . CAD (coronary artery disease)   . CHF (congestive heart failure) ([place])   . Chicken pox   . Corneal dystrophy    bilateral  . Diabetes ([place])   . Dysrhythmia    A-FIB AND PAF  . GERD (gastroesophageal reflux disease)   . Gout   . History of shingles   . Hyperlipemia   . Hypertension   . Myocardial infarction (HCC)    x4  . Peripheral neuropathy   . Peripheral neuropathy   . Peripheral neuropathy   . PUD (peptic ulcer disease)   . Renal artery stenosis ([place])   . Renal artery stenosis ([place])   . Salzmann's nodular dystrophy 2012  . Spinal stenosis     Review of systems:      Physical Exam    Heart and lungs: Regular  rate and rhythm without rub or gallop, lungs are bilaterally clear.    HEENT: Normocephalic atraumatic eyes are anicteric    Other:     Pertinant exam for procedure: Soft nontender nondistended bowel sounds positive normoactive.    Planned proceedures: Colonoscopy and indicated procedures. I have discussed the risks benefits and complications of procedures to include not limited to bleeding, infection, perforation and the risk of sedation  and the patient wishes to proceed.    Chase Sails, [facility] 04/13/2017  11:59 AM

## 2017-04-13 NOTE — Anesthesia Procedure Notes (Signed)
Performed by: Vaughan Sine Pre-anesthesia Checklist: Emergency Drugs available, Patient identified, Suction available, Patient being monitored and Timeout performed Patient Re-evaluated:Patient Re-evaluated prior to induction Oxygen Delivery Method: Simple face mask Preoxygenation: Pre-oxygenation with 100% oxygen Induction Type: IV induction Ventilation: Oral airway inserted - appropriate to patient size Placement Confirmation: positive ETCO2 and CO2 detector

## 2017-04-14 ENCOUNTER — Ambulatory Visit (INDEPENDENT_AMBULATORY_CARE_PROVIDER_SITE_OTHER): Payer: Medicare Other | Admitting: Urology

## 2017-04-14 ENCOUNTER — Encounter: Payer: Self-pay | Admitting: Urology

## 2017-04-14 VITALS — BP 92/44 | HR 58 | Ht 68.0 in | Wt 197.3 lb

## 2017-04-14 DIAGNOSIS — I2510 Atherosclerotic heart disease of native coronary artery without angina pectoris: Secondary | ICD-10-CM | POA: Diagnosis not present

## 2017-04-14 DIAGNOSIS — R32 Unspecified urinary incontinence: Secondary | ICD-10-CM

## 2017-04-14 LAB — BLADDER SCAN AMB NON-IMAGING: Scan Result: 19

## 2017-04-14 LAB — MICROSCOPIC EXAMINATION
BACTERIA UA: NONE SEEN
Epithelial Cells (non renal): NONE SEEN /hpf (ref 0–10)

## 2017-04-14 LAB — URINALYSIS, COMPLETE
Bilirubin, UA: NEGATIVE
GLUCOSE, UA: NEGATIVE
Ketones, UA: NEGATIVE
NITRITE UA: NEGATIVE
PROTEIN UA: NEGATIVE
Specific Gravity, UA: 1.015 (ref 1.005–1.030)
Urobilinogen, Ur: 0.2 mg/dL (ref 0.2–1.0)
pH, UA: 5 (ref 5.0–7.5)

## 2017-04-14 MED ORDER — MIRABEGRON ER 25 MG PO TB24: 25.0000 mg | ORAL_TABLET | Freq: Every day | ORAL | 11 refills | Status: DC

## 2017-04-14 NOTE — Addendum Note (Signed)
Addended by: Kyra Manges on: 04/14/2017 03:04 PM   Modules accepted: Orders

## 2017-04-14 NOTE — Progress Notes (Signed)
04/14/2017 2:56 PM   Chase Cabrera [birth year] [id number]  Referring provider: Coral Spikes, DO [address] [place], [place] [address]  Chief Complaint  Patient presents with  . Urinary Incontinence    HPI: The patient is a 71 year old gentleman with past medical history of BPH on Flomax, coronary artery disease status post CABG, diabetes, and peripheral neuropathy presents today for evaluation of urinary incontinence.  This has been going on approximately 2 years. He does not associate the urge to urinate, but he does note that he frequently has episodes of incontinence. He has to wear depends. He was originally started on Flomax for this issue by another urologist though it did not help it. He feels he has a good stream and empties his bladder. He has no hesitancy. His PVR today is 19 cc.  Note, the patient has severe neuropathy in his lower extremities and requires a walker for ambulation.   PMH: Past Medical History:  Diagnosis Date  . Arthritis   . Atrial fibrillation ([place])   . CAD (coronary artery disease)   . CHF (congestive heart failure) ([place])   . Chicken pox   . Corneal dystrophy    bilateral  . Diabetes ([place])   . Dysrhythmia    A-FIB AND PAF  . GERD (gastroesophageal reflux disease)   . Gout   . History of shingles   . Hyperlipemia   . Hypertension   . Myocardial infarction (HCC)    x4  . Peripheral neuropathy   . Peripheral neuropathy   . Peripheral neuropathy   . PUD (peptic ulcer disease)   . Renal artery stenosis ([place])   . Renal artery stenosis ([place])   . Salzmann's nodular dystrophy 2012  . Spinal stenosis     Surgical History: Past Surgical History:  Procedure Laterality Date  . APPLICATION VERTERBRAL DEFECT PROSTHETIC  05/01/2013  . ARTHRODESIS ANTERIOR LUMBAR SPINE  05/01/2013  . BACK SURGERY     lumbar fusion  . CARDIAC CATHETERIZATION    . CARDIAC ELECTROPHYSIOLOGY STUDY AND ABLATION    . CARDIAC SURGERY    .  CARDIOVERSION    . CHOLECYSTECTOMY    . COLONOSCOPY WITH PROPOFOL N/A 04/09/2016   Procedure: COLONOSCOPY WITH PROPOFOL;  Surgeon: Lollie Sails, MD;  Location: [facility];  Service: Endoscopy;  Laterality: N/A;  . CORNEAL EYE SURGERY Bilateral 07/28/11    09/22/2011  . CORONARY ANGIOPLASTY    . CORONARY ANGIOPLASTY WITH STENT PLACEMENT  03/28/2015   Distal 80% to normal Stent, Dilation Balloon  . CORONARY ARTERY BYPASS GRAFT    . EYE SURGERY     bilateral cataract  . foot and ankle repair Right 2005  . FRACTURE SURGERY     ANKLE PLATE AND SCREWS  . GALLBLADER    . JOINT REPLACEMENT    . KNEE ARTHROSCOPY Right   . LUMBAR SPINE FUSION ONE LEVEL  05/03/2013  . RENAL ARTERY STENT Right   . ROTATOR CUFF REPAIR Right 2006  . TONSILLECTOMY    . TOTAL HIP ARTHROPLASTY Left 11/11/2015   Procedure: TOTAL HIP ARTHROPLASTY ANTERIOR APPROACH;  Surgeon: Hessie Knows, MD;  Location: [facility] ORS;  Service: Orthopedics;  Laterality: Left;  . TRANSCATH PLACEMENT INTRAVASCULAR STENT LEG  03/2015    Home Medications:  Allergies as of 04/14/2017      Reactions   Allopurinol Diarrhea, Other (See Comments), Nausea And Vomiting   Other Reaction: GI Upset   Atenolol Other (See Comments)   Other  Reaction: bradycardia   Atorvastatin Other (See Comments)   Other Reaction: muscle aches   Ramipril Rash   Simvastatin Other (See Comments)   Other Reaction: MUSCLE ACHES (Zocor)   Valsartan Other (See Comments)   Other Reaction: facial swelling   Rosuvastatin Other (See Comments)   Muscle aches   Cardizem [diltiazem] Rash      Medication List       Accurate as of 04/14/17  2:56 PM. Always use your most recent med list.          amLODipine 10 MG tablet Commonly known as:  NORVASC Take 10 mg by mouth daily.   azelastine 0.1 % nasal spray Commonly known as:  ASTELIN Place 2 sprays into both nostrils 2 (two) times daily. Use in each nostril as directed   BENTYL 10 MG capsule Generic drug:   dicyclomine Take 10 mg by mouth 4 (four) times daily.   carvedilol 25 MG tablet Commonly known as:  COREG take 1 tablet by mouth twice a day   cetirizine 10 MG tablet Commonly known as:  ZYRTEC Take 10 mg by mouth daily.   clopidogrel 75 MG tablet Commonly known as:  PLAVIX Take 75 mg by mouth daily.   colchicine 0.6 MG tablet take 1 tablet by mouth once daily   diclofenac sodium 1 % Gel Commonly known as:  VOLTAREN Apply 2 g topically 4 (four) times daily.   doxycycline 100 MG capsule Commonly known as:  VIBRAMYCIN Take 100 mg by mouth 2 (two) times daily.   furosemide 20 MG tablet Commonly known as:  LASIX take 1 tablet by mouth once daily   gabapentin 300 MG capsule Commonly known as:  NEURONTIN take 1 capsule by mouth three times a day   guaiFENesin 600 MG 12 hr tablet Commonly known as:  MUCINEX Take by mouth 2 (two) times daily.   hydrALAZINE 100 MG tablet Commonly known as:  APRESOLINE take 1 tablet by mouth three times a day   HYDROcodone-acetaminophen 10-325 MG tablet Commonly known as:  NORCO Take 1 tablet by mouth every 6 (six) hours as needed.   HYDROcodone-homatropine 5-1.5 MG/5ML syrup Commonly known as:  HYCODAN Take 5 mLs by mouth every 6 (six) hours as needed for cough.   insulin lispro 100 UNIT/ML KiwkPen Commonly known as:  HUMALOG Take 15 - 25 units up to three times daily as directed   isosorbide mononitrate 120 MG 24 hr tablet Commonly known as:  IMDUR take 1 tablet by mouth once daily   LEVEMIR FLEXTOUCH 100 UNIT/ML Pen Generic drug:  Insulin Detemir   mesalamine 1.2 g EC tablet Commonly known as:  LIALDA Take 1.2 g by mouth daily with breakfast.   metFORMIN 1000 MG tablet Commonly known as:  GLUCOPHAGE Take 1 tablet by mouth 2 (two) times daily.   mirabegron ER 25 MG Tb24 tablet Commonly known as:  MYRBETRIQ Take 1 tablet (25 mg total) by mouth daily.   MULTI-VITAMINS Tabs Take 1 tablet by mouth daily.   nitroGLYCERIN  0.4 MG SL tablet Commonly known as:  NITROSTAT Place 1 tablet under the tongue every 5 (five) minutes x 3 doses as needed. For chest pain.  Call 911 if no relief after 3 tablets   omeprazole 40 MG capsule Commonly known as:  PRILOSEC Take 40 mg by mouth daily.   potassium chloride SA 20 MEQ tablet Commonly known as:  K-DUR,KLOR-CON take 2 tablets by mouth twice a day   pravastatin 40 MG tablet  Commonly known as:  PRAVACHOL take 1 tablet by mouth once daily   probenecid 500 MG tablet Commonly known as:  BENEMID Take 1 tablet by mouth 2 (two) times daily.   tamsulosin 0.4 MG Caps capsule Commonly known as:  FLOMAX take 1 capsule by mouth once daily   vitamin C 500 MG tablet Commonly known as:  ASCORBIC ACID Take 1,000 mg by mouth daily.   Vitamin E 400 units Tabs Take 400 Units by mouth 2 (two) times daily.   warfarin 4 MG tablet Commonly known as:  COUMADIN 1 tablet Tuesday, Thursday, Saturday, and Sunday.   warfarin 3 MG tablet Commonly known as:  COUMADIN 1 tablet  Monday, Wednesday, Friday.       Allergies:  Allergies  Allergen Reactions  . Allopurinol Diarrhea, Other (See Comments) and Nausea And Vomiting    Other Reaction: GI Upset  . Atenolol Other (See Comments)    Other Reaction: bradycardia  . Atorvastatin Other (See Comments)    Other Reaction: muscle aches  . Ramipril Rash  . Simvastatin Other (See Comments)    Other Reaction: MUSCLE ACHES (Zocor)  . Valsartan Other (See Comments)    Other Reaction: facial swelling  . Rosuvastatin Other (See Comments)    Muscle aches  . Cardizem [Diltiazem] Rash    Family History: Family History  Problem Relation Age of Onset  . CVA Father   . Stroke Father   . Lung cancer Mother   . Arthritis Mother   . Stomach cancer Sister   . Colon cancer Sister   . Diabetes Brother     Social History:  reports that he quit smoking about 54 years ago. His smoking use included Cigarettes. He has a 3.00 pack-year  smoking history. He quit smokeless tobacco use about 48 years ago. His smokeless tobacco use included Chew. He reports that he does not drink alcohol or use drugs.  ROS: UROLOGY Frequent Urination?: Yes Hard to postpone urination?: Yes Burning/pain with urination?: No Get up at night to urinate?: Yes Leakage of urine?: Yes Urine stream starts and stops?: Yes Trouble starting stream?: Yes Do you have to strain to urinate?: No Blood in urine?: No Urinary tract infection?: No Sexually transmitted disease?: No Injury to kidneys or bladder?: No Painful intercourse?: No Weak stream?: No Erection problems?: No Penile pain?: No  Gastrointestinal Nausea?: Yes Vomiting?: No Indigestion/heartburn?: No Diarrhea?: Yes Constipation?: No  Constitutional Fever: Yes Night sweats?: No Weight loss?: No Fatigue?: No  Skin Skin rash/lesions?: No Itching?: No  Eyes Blurred vision?: Yes Double vision?: No  Ears/Nose/Throat Sore throat?: No Sinus problems?: No  Hematologic/Lymphatic Swollen glands?: Yes Easy bruising?: No  Cardiovascular Leg swelling?: No Chest pain?: Yes  Respiratory Cough?: No Shortness of breath?: Yes  Endocrine Excessive thirst?: Yes  Musculoskeletal Back pain?: Yes Joint pain?: Yes  Neurological Headaches?: No Dizziness?: No  Psychologic Depression?: No Anxiety?: No  Physical Exam: BP (!) 92/44 (BP Location: Left Arm, Patient Position: Sitting, Cuff Size: Normal)   Pulse (!) 58   Ht 5' 8"  (1.727 m)   Wt 197 lb 4.8 oz (89.5 kg)   BMI 30.00 kg/m   Constitutional:  Alert and oriented, No acute distress. HEENT: [place] AT, moist mucus membranes.  Trachea midline, no masses. Cardiovascular: No clubbing, cyanosis, or edema. Respiratory: Normal respiratory effort, no increased work of breathing. GI: Abdomen is soft, nontender, nondistended, no abdominal masses GU: No CVA tenderness. DRE: 2+ smooth benign per Skin: No rashes, bruises or  suspicious lesions. Lymph: No cervical or inguinal adenopathy. Neurologic: Grossly intact, no focal deficits, moving all 4 extremities. Psychiatric: Normal mood and affect.  Laboratory Data: Lab Results  Component Value Date   WBC 6.4 04/12/2017   HGB 13.8 04/12/2017   HCT 39.9 (L) 04/12/2017   MCV 96.1 04/12/2017   PLT 208 04/12/2017    Lab Results  Component Value Date   CREATININE 0.94 03/24/2016    No results found for: PSA  No results found for: TESTOSTERONE  No results found for: HGBA1C  Urinalysis    Component Value Date/Time   COLORURINE YELLOW (A) 10/28/2015 1425   APPEARANCEUR CLEAR (A) 10/28/2015 1425   LABSPEC 1.006 10/28/2015 1425   PHURINE 5.0 10/28/2015 1425   GLUCOSEU NEGATIVE 10/28/2015 [place] 10/28/2015 [place] 10/28/2015 [place] 10/28/2015 [place] 10/28/2015 1425   NITRITE NEGATIVE 10/28/2015 1425   LEUKOCYTESUR NEGATIVE 10/28/2015 1425    Assessment & Plan:    1. Urge incontinence I think the patient's suffering from urge incontinence though not experiencing urgency is much due to his severe neuropathy. We'll try him on Myrebtriq at this time. We can switch him to an anticholinergic if this is cost prohibitive. We'll him follow-up in 3 months to assess his progress. We may need to consider cystoscopy and urodynamics at that time if she fails to improve.  2. BPH Continue Flomax for now. We ca consider stopping this medication after getting the above symptoms under control.  Return in about 3 months (around 07/15/2017).  Nickie Retort, MD  [facility] [address], [place], [place] [address] [phone]

## 2017-04-15 LAB — SURGICAL PATHOLOGY

## 2017-04-19 ENCOUNTER — Other Ambulatory Visit: Payer: Self-pay | Admitting: Gastroenterology

## 2017-04-19 DIAGNOSIS — K5710 Diverticulosis of small intestine without perforation or abscess without bleeding: Secondary | ICD-10-CM

## 2017-04-20 ENCOUNTER — Ambulatory Visit (INDEPENDENT_AMBULATORY_CARE_PROVIDER_SITE_OTHER): Payer: Medicare Other | Admitting: Family Medicine

## 2017-04-20 ENCOUNTER — Encounter: Payer: Self-pay | Admitting: Family Medicine

## 2017-04-20 DIAGNOSIS — M6281 Muscle weakness (generalized): Secondary | ICD-10-CM | POA: Diagnosis not present

## 2017-04-20 DIAGNOSIS — R2681 Unsteadiness on feet: Secondary | ICD-10-CM | POA: Diagnosis not present

## 2017-04-20 DIAGNOSIS — R0781 Pleurodynia: Secondary | ICD-10-CM

## 2017-04-20 DIAGNOSIS — I2510 Atherosclerotic heart disease of native coronary artery without angina pectoris: Secondary | ICD-10-CM | POA: Diagnosis not present

## 2017-04-20 NOTE — Assessment & Plan Note (Signed)
Established problem, worsening. Sending to [facility] Dr. Tamala Julian.

## 2017-04-20 NOTE — Patient Instructions (Signed)
We will call with the referral to see Dr. Tamala Julian.  Take care  Dr. Lacinda Axon

## 2017-04-20 NOTE — Progress Notes (Signed)
Subjective:  Patient ID: Chase Cabrera, male    DOB: [birth year]  Age: 71 y.o. MRN: [id number]  CC: Follow up - still having rib pain  HPI:  71 year old male with an extensive past medical history including atrial fibrillation, coronary artery disease, DM 2 followed by endocrinology, hypertension, hyperlipidemia presents for follow-up. He continues to have rib/intercostal pain.  Rib/intercostal pain  Was seen previously by me for this a few months ago.  Continues to persist.  Located on the right side. Right lower ribs/intercostal region. Radiates to the back.  Severe. Worse with certain ranges of motion as well as lying on that side.  He doesn't appear that he attended physical therapy.  No other interventions tried.  He reports that he is very bothered by this and wants this gone.  He has attempted to see a walk-in clinic but was told that they don't treat this.  Social Hx   Social History   Social History  . Marital status: Married    Spouse name: N/A  . Number of children: N/A  . Years of education: N/A   Social History Main Topics  . Smoking status: Former Smoker    Packs/day: 1.00    Years: 3.00    Types: Cigarettes    Quit date: 06/30/1962  . Smokeless tobacco: Former Systems developer    Types: Chew    Quit date: 12/05/1968  . Alcohol use No  . Drug use: No  . Sexual activity: Not Asked   Other Topics Concern  . None   Social History Narrative  . None    Review of Systems  Constitutional: Negative.   Musculoskeletal:       Rib/intercostal pain, right.   Objective:  BP (!) 150/60 (BP Location: Right Arm, Patient Position: Sitting, Cuff Size: Normal)   Pulse 64   Temp 98 F (36.7 C) (Oral)   Resp 18   Wt 199 lb 2 oz (90.3 kg)   SpO2 94%   BMI 30.28 kg/m   BP/Weight 04/20/2017 03/21/4800 02/15/5373  Systolic BP 827 92 078  Diastolic BP 60 44 66  Wt. (Lbs) 199.13 197.3 194  BMI 30.28 30 29.5    Physical Exam  Constitutional: He is oriented to  person, place, and time. He appears well-developed. No distress.  HENT:  Head: Normocephalic and atraumatic.  Eyes: Conjunctivae are normal. No scleral icterus.  Cardiovascular: Normal rate and regular rhythm.   Pulmonary/Chest: Effort normal. No respiratory distress. He has no wheezes. He has no rales.  Musculoskeletal:       Arms: Neurological: He is alert and oriented to person, place, and time.  Patient has severe tenderness to palpation of the rib and intercostal region just below the right breast/pec.  Psychiatric: He has a normal mood and affect.  Vitals reviewed.   Lab Results  Component Value Date   WBC 6.4 04/12/2017   HGB 13.8 04/12/2017   HCT 39.9 (L) 04/12/2017   PLT 208 04/12/2017   GLUCOSE 134 (H) 03/24/2016   CHOL 124 03/24/2016   TRIG 389.0 (H) 03/24/2016   HDL 25.60 (L) 03/24/2016   LDLDIRECT 51.0 03/24/2016   ALT 27 03/24/2016   AST 23 03/24/2016   NA 136 03/24/2016   K 4.3 03/24/2016   CL 102 03/24/2016   CREATININE 0.94 03/24/2016   BUN 18 03/24/2016   CO2 24 03/24/2016   INR 1.25 04/13/2017    Assessment & Plan:   Problem List Items Addressed This Visit  Rib pain on right side    Established problem, worsening. Sending to [facility] Dr. Tamala Julian.       Relevant Orders   AMB referral to sports medicine     Follow-up: PRN  [place]

## 2017-04-22 ENCOUNTER — Encounter: Payer: Self-pay | Admitting: Family Medicine

## 2017-04-22 DIAGNOSIS — M6281 Muscle weakness (generalized): Secondary | ICD-10-CM | POA: Diagnosis not present

## 2017-04-22 DIAGNOSIS — R2681 Unsteadiness on feet: Secondary | ICD-10-CM | POA: Diagnosis not present

## 2017-04-25 ENCOUNTER — Ambulatory Visit: Payer: Medicare Other | Attending: Gastroenterology

## 2017-04-26 DIAGNOSIS — M6281 Muscle weakness (generalized): Secondary | ICD-10-CM | POA: Diagnosis not present

## 2017-04-26 DIAGNOSIS — R2681 Unsteadiness on feet: Secondary | ICD-10-CM | POA: Diagnosis not present

## 2017-04-28 DIAGNOSIS — M6281 Muscle weakness (generalized): Secondary | ICD-10-CM | POA: Diagnosis not present

## 2017-04-28 DIAGNOSIS — R2681 Unsteadiness on feet: Secondary | ICD-10-CM | POA: Diagnosis not present

## 2017-05-03 DIAGNOSIS — M6281 Muscle weakness (generalized): Secondary | ICD-10-CM | POA: Diagnosis not present

## 2017-05-03 DIAGNOSIS — R2681 Unsteadiness on feet: Secondary | ICD-10-CM | POA: Diagnosis not present

## 2017-05-05 DIAGNOSIS — J310 Chronic rhinitis: Secondary | ICD-10-CM | POA: Diagnosis not present

## 2017-05-05 DIAGNOSIS — J329 Chronic sinusitis, unspecified: Secondary | ICD-10-CM | POA: Diagnosis not present

## 2017-05-06 DIAGNOSIS — M6281 Muscle weakness (generalized): Secondary | ICD-10-CM | POA: Diagnosis not present

## 2017-05-06 DIAGNOSIS — R2681 Unsteadiness on feet: Secondary | ICD-10-CM | POA: Diagnosis not present

## 2017-05-07 NOTE — Progress Notes (Signed)
[facility] [place], [place] [address] Phone: [phone] Subjective:    I'm seeing this patient by the request  of:  Coral Spikes, DO   CC: rib pain  [id number]  Chase Cabrera is a 71 y.o. male coming in with complaint of rib pain. Patient is on this on and off for multiple months. Located mostly on the right side in the intercostal region. Seems to radiate towards his back. Worse with certain range of motion as well as lying on that side. Does not state that his been any type of rash. Onset- 2 months Location- right ribs Duration- constant  Character- sharp and dull Aggravating factors- any movements Reliving factors- Nothing patient states Therapies tried- Nothing over-the-counter. Does have pain medications from another doctor that didn't even seem to help Severity- 6/10     Past Medical History:  Diagnosis Date  . Arthritis   . Atrial fibrillation ([place])   . CAD (coronary artery disease)   . CHF (congestive heart failure) ([place])   . Chicken pox   . Corneal dystrophy    bilateral  . Diabetes ([place])   . Dysrhythmia    A-FIB AND PAF  . GERD (gastroesophageal reflux disease)   . Gout   . History of shingles   . Hyperlipemia   . Hypertension   . Myocardial infarction (H[facility])    x4  . Peripheral neuropathy   . Peripheral neuropathy   . Peripheral neuropathy   . PUD (peptic ulcer disease)   . Renal artery stenosis ([place])   . Renal artery stenosis ([place])   . Salzmann's nodular dystrophy 2012  . Spinal stenosis    Past Surgical History:  Procedure Laterality Date  . APPLICATION VERTERBRAL DEFECT PROSTHETIC  05/01/2013  . ARTHRODESIS ANTERIOR LUMBAR SPINE  05/01/2013  . BACK SURGERY     lumbar fusion  . CARDIAC CATHETERIZATION    . CARDIAC ELECTROPHYSIOLOGY STUDY AND ABLATION    . CARDIAC SURGERY    . CARDIOVERSION    . CHOLECYSTECTOMY    . COLONOSCOPY WITH PROPOFOL N/A 04/09/2016   Procedure: COLONOSCOPY WITH  PROPOFOL;  Surgeon: Lollie Sails, MD;  [facility] ENDOSCOPY;  Service: Endoscopy;  Laterality: N/A;  . CORNEAL EYE SURGERY Bilateral 07/28/11    09/22/2011  . CORONARY ANGIOPLASTY    . CORONARY ANGIOPLASTY WITH STENT PLACEMENT  03/28/2015   Distal 80% to normal Stent, Dilation Balloon  . CORONARY ARTERY BYPASS GRAFT    . EYE SURGERY     bilateral cataract  . foot and ankle repair Right 2005  . FRACTURE SURGERY     ANKLE PLATE AND SCREWS  . GALLBLADER    . JOINT REPLACEMENT    . KNEE ARTHROSCOPY Right   . LUMBAR SPINE FUSION ONE LEVEL  05/03/2013  . RENAL ARTERY STENT Right   . ROTATOR CUFF REPAIR Right 2006  . TONSILLECTOMY    . TOTAL HIP ARTHROPLASTY Left 11/11/2015   Procedure: TOTAL HIP ARTHROPLASTY ANTERIOR APPROACH;  Surgeon: Hessie Knows, MD;  Location: [facility] ORS;  Service: Orthopedics;  Laterality: Left;  . [place] INTRAVASCULAR STENT LEG  03/2015   Social History   Social History  . Marital status: Married    Spouse name: N/A  . Number of children: N/A  . Years of education: N/A   Social History Main Topics  . Smoking status: Former Smoker    Packs/day: 1.00    Years: 3.00    Types:  Cigarettes    Quit date: 06/30/1962  . Smokeless tobacco: Former Systems developer    Types: Chew    Quit date: 12/05/1968  . Alcohol use No  . Drug use: No  . Sexual activity: Not Asked   Other Topics Concern  . None   Social History Narrative  . None   Allergies  Allergen Reactions  . Allopurinol Diarrhea, Other (See Comments) and Nausea And Vomiting    Other Reaction: GI Upset  . Atenolol Other (See Comments)    Other Reaction: bradycardia  . Atorvastatin Other (See Comments)    Other Reaction: muscle aches  . Ramipril Rash  . Simvastatin Other (See Comments)    Other Reaction: MUSCLE ACHES (Zocor)  . Valsartan Other (See Comments)    Other Reaction: facial swelling  . Rosuvastatin Other (See Comments)    Muscle aches  . Cardizem [Diltiazem] Rash   Family  History  Problem Relation Age of Onset  . CVA Father   . Stroke Father   . Lung cancer Mother   . Arthritis Mother   . Stomach cancer Sister   . Colon cancer Sister   . Diabetes Brother      Past medical history, social, surgical and family history all reviewed in electronic medical record.  No pertanent information unless stated regarding to the chief complaint.   Review of Systems:Review of systems updated and as accurate as of 05/09/17  No  visual changes, nausea, vomiting, diarrhea, constipation, dizziness, abdominal pain, skin rash, fevers, chills, night sweats, weight loss, swollen lymph nodes, , chest pain, shortness of breath, mood changes. Positive headaches, muscle aches, body aches  Objective  Blood pressure 110/60, pulse 82, weight 193 lb (87.5 kg). Systems examined below as of 05/09/17   General: No apparent distress alert and oriented x3 mood and affect normal, dressed appropriately.  HEENT: Pupils equal, extraocular movements intact  Respiratory: Patient's speak in full sentences and does not appear short of breath  Cardiovascular: No lower extremity edema, non tender, no erythema  Skin: Warm dry intact with no signs of infection or rash on extremities or on axial skeleton.  Abdomen: Soft And patient's costal ankle inferiorly on the right patient is significantly tender even to light palpation. Pain in the right upper quadrant of the abdomen. Patient does have some diffuse tenderness in all quadrants of the abdomen. Neuro: Cranial nerves II through XII are intact, neurovascularly intact in all extremities with 2+ DTRs and 2+ pulses.  Lymph: No lymphadenopathy of posterior or anterior cervical chain or axillae bilaterally.  Gait normal with good balance and coordination.  MSK:  Non tender with full range of motion and good stability and symmetric strength and tone of shoulders, elbows, wrist, hip, knee and ankles bilaterally. Arthritic changes of multiple joints. Back  exam shows the patient does have some kyphosis and tenderness in the paraspinal musculature of the lumbar spine    Impression and Recommendations:     This case required medical decision making of moderate complexity.      Note: This dictation was prepared with Dragon dictation along with smaller phrase technology. Any transcriptional errors that result from this process are unintentional.

## 2017-05-09 ENCOUNTER — Encounter: Payer: Self-pay | Admitting: Family Medicine

## 2017-05-09 ENCOUNTER — Ambulatory Visit (INDEPENDENT_AMBULATORY_CARE_PROVIDER_SITE_OTHER)
Admission: RE | Admit: 2017-05-09 | Discharge: 2017-05-09 | Disposition: A | Discharge status: Home / Self Care | Payer: Medicare Other | Source: Ambulatory Visit | Attending: Family Medicine | Admitting: Family Medicine

## 2017-05-09 ENCOUNTER — Ambulatory Visit (INDEPENDENT_AMBULATORY_CARE_PROVIDER_SITE_OTHER): Payer: Medicare Other | Admitting: Family Medicine

## 2017-05-09 ENCOUNTER — Telehealth: Payer: Self-pay | Admitting: Radiology

## 2017-05-09 ENCOUNTER — Other Ambulatory Visit: Payer: Self-pay | Admitting: Radiology

## 2017-05-09 VITALS — BP 110/60 | HR 82 | Wt 193.0 lb

## 2017-05-09 DIAGNOSIS — J9811 Atelectasis: Secondary | ICD-10-CM | POA: Diagnosis not present

## 2017-05-09 DIAGNOSIS — Z7901 Long term (current) use of anticoagulants: Principal | ICD-10-CM

## 2017-05-09 DIAGNOSIS — R0781 Pleurodynia: Secondary | ICD-10-CM

## 2017-05-09 DIAGNOSIS — Z5181 Encounter for therapeutic drug level monitoring: Secondary | ICD-10-CM

## 2017-05-09 DIAGNOSIS — I2510 Atherosclerotic heart disease of native coronary artery without angina pectoris: Secondary | ICD-10-CM | POA: Diagnosis not present

## 2017-05-09 MED ORDER — ACYCLOVIR 400 MG PO TABS: 400.0000 mg | ORAL_TABLET | Freq: Two times a day (BID) | ORAL | 0 refills | Status: DC

## 2017-05-09 MED ORDER — VITAMIN D (ERGOCALCIFEROL) 1.25 MG (50000 UNIT) PO CAPS: 50000.0000 [IU] | ORAL_CAPSULE | ORAL | 0 refills | Status: DC

## 2017-05-09 NOTE — Patient Instructions (Addendum)
Good to see you  Ice could be helpful  Once weekly vitamin D for next 8 week.s Acyclovir 2 times daily for 7 days.  Arnica lotion 2 times a day get this over the counter See if any association with food.  If no improvement in 1 week call and we will get CT scan of chest and abdomen.  Otherwise see me again in 3 weeks.

## 2017-05-09 NOTE — Telephone Encounter (Signed)
Opened in error

## 2017-05-09 NOTE — Assessment & Plan Note (Signed)
Patient has pain in the wrist but seems to even have pain to light palpation. Does not make sense and likely more neurologic such as a neuralgia. No rashes the moment but posterior padding neuralgia is within the differential. Acyclovir given today. X-rays ordered today to make sure there is no bony normality like to be contributing. Patient has had significant number of comorbidities and we may need to consider further workup. This would include possible advance imaging as well as laboratory workup. Patient will try conservative therapy first and then see how patient response.

## 2017-05-11 ENCOUNTER — Other Ambulatory Visit: Payer: Medicare Other

## 2017-05-13 ENCOUNTER — Other Ambulatory Visit: Payer: Self-pay | Admitting: Family Medicine

## 2017-05-14 ENCOUNTER — Other Ambulatory Visit: Payer: Self-pay | Admitting: Family Medicine

## 2017-05-17 DIAGNOSIS — Z794 Long term (current) use of insulin: Secondary | ICD-10-CM | POA: Diagnosis not present

## 2017-05-17 DIAGNOSIS — E1165 Type 2 diabetes mellitus with hyperglycemia: Secondary | ICD-10-CM | POA: Diagnosis not present

## 2017-05-19 DIAGNOSIS — M6281 Muscle weakness (generalized): Secondary | ICD-10-CM | POA: Diagnosis not present

## 2017-05-19 DIAGNOSIS — R2681 Unsteadiness on feet: Secondary | ICD-10-CM | POA: Diagnosis not present

## 2017-05-20 DIAGNOSIS — N182 Chronic kidney disease, stage 2 (mild): Secondary | ICD-10-CM | POA: Diagnosis not present

## 2017-05-20 DIAGNOSIS — E1142 Type 2 diabetes mellitus with diabetic polyneuropathy: Secondary | ICD-10-CM | POA: Diagnosis not present

## 2017-05-20 DIAGNOSIS — E1122 Type 2 diabetes mellitus with diabetic chronic kidney disease: Secondary | ICD-10-CM | POA: Diagnosis not present

## 2017-05-20 DIAGNOSIS — Z794 Long term (current) use of insulin: Secondary | ICD-10-CM | POA: Diagnosis not present

## 2017-05-20 DIAGNOSIS — E1159 Type 2 diabetes mellitus with other circulatory complications: Secondary | ICD-10-CM | POA: Diagnosis not present

## 2017-05-25 DIAGNOSIS — M6281 Muscle weakness (generalized): Secondary | ICD-10-CM | POA: Diagnosis not present

## 2017-05-25 DIAGNOSIS — R2681 Unsteadiness on feet: Secondary | ICD-10-CM | POA: Diagnosis not present

## 2017-05-27 DIAGNOSIS — M6281 Muscle weakness (generalized): Secondary | ICD-10-CM | POA: Diagnosis not present

## 2017-05-27 DIAGNOSIS — R2681 Unsteadiness on feet: Secondary | ICD-10-CM | POA: Diagnosis not present

## 2017-05-30 DIAGNOSIS — M6281 Muscle weakness (generalized): Secondary | ICD-10-CM | POA: Diagnosis not present

## 2017-05-30 DIAGNOSIS — R2681 Unsteadiness on feet: Secondary | ICD-10-CM | POA: Diagnosis not present

## 2017-06-02 DIAGNOSIS — R2681 Unsteadiness on feet: Secondary | ICD-10-CM | POA: Diagnosis not present

## 2017-06-02 DIAGNOSIS — M6281 Muscle weakness (generalized): Secondary | ICD-10-CM | POA: Diagnosis not present

## 2017-06-03 DIAGNOSIS — J310 Chronic rhinitis: Secondary | ICD-10-CM | POA: Diagnosis not present

## 2017-06-21 ENCOUNTER — Telehealth: Payer: Self-pay | Admitting: *Deleted

## 2017-06-21 MED ORDER — GABAPENTIN 400 MG PO CAPS: 400.0000 mg | ORAL_CAPSULE | Freq: Three times a day (TID) | ORAL | 5 refills | Status: DC

## 2017-06-21 NOTE — Telephone Encounter (Signed)
Received refill request for Gabapentin 416m #[phone]. Dr. RPaulla Dollyordered refill +5additional, needs an appt prior to future refills.

## 2017-07-10 ENCOUNTER — Other Ambulatory Visit: Payer: Self-pay | Admitting: Family Medicine

## 2017-07-11 NOTE — Telephone Encounter (Signed)
Patient needs appointment scheduled to establish care with a provider here. Please confirm that he is taking these medications as the Imdur is not listed in his most recent cardiology note.

## 2017-07-11 NOTE — Telephone Encounter (Signed)
Last OV was 04/20/17, no follow up on file.  Last refills were 05/13/2017, and 10/15/16, please advise, thanks

## 2017-07-12 NOTE — Telephone Encounter (Signed)
Spoke with patient, plans to establish with Dr. Aundra Dubin.  Scheduled for the 20th.  Refilled medications till then only.  Confirmed that he is taking both medications that were requested. thanks

## 2017-07-13 DIAGNOSIS — H1851 Endothelial corneal dystrophy: Secondary | ICD-10-CM | POA: Diagnosis not present

## 2017-07-13 DIAGNOSIS — Z961 Presence of intraocular lens: Secondary | ICD-10-CM | POA: Diagnosis not present

## 2017-07-13 DIAGNOSIS — E119 Type 2 diabetes mellitus without complications: Secondary | ICD-10-CM | POA: Diagnosis not present

## 2017-07-13 DIAGNOSIS — H524 Presbyopia: Secondary | ICD-10-CM | POA: Diagnosis not present

## 2017-07-19 ENCOUNTER — Telehealth: Payer: Self-pay

## 2017-07-19 NOTE — Telephone Encounter (Signed)
Noted  

## 2017-07-19 NOTE — Telephone Encounter (Signed)
There is already an INR ordered.  Patient needs to have this done.  Please find out what his Coumadin regimen is currently as well.  Thanks.

## 2017-07-19 NOTE — Telephone Encounter (Signed)
Patients wife calls stating patient is overdue for Protime would you be willing to order it looks like future order is placed would you be willing to review.   Patient  is going to establish with Dr Aundra Dubin.

## 2017-07-19 NOTE — Telephone Encounter (Signed)
Patients wife advised of below , labs already ordered . Lab appointment scheduled.   Current regimen Tues, Thurs, Sat, Sun Coumadin 4m.   All other days Coumadin 3 mg.  Denies bleeding or bruising.

## 2017-07-20 ENCOUNTER — Other Ambulatory Visit: Payer: Medicare Other

## 2017-07-21 ENCOUNTER — Ambulatory Visit (INDEPENDENT_AMBULATORY_CARE_PROVIDER_SITE_OTHER): Payer: Medicare Other | Admitting: Urology

## 2017-07-21 ENCOUNTER — Encounter: Payer: Self-pay | Admitting: Urology

## 2017-07-21 VITALS — BP 119/56 | HR 55 | Ht 68.0 in | Wt 191.6 lb

## 2017-07-21 DIAGNOSIS — I2510 Atherosclerotic heart disease of native coronary artery without angina pectoris: Secondary | ICD-10-CM | POA: Diagnosis not present

## 2017-07-21 DIAGNOSIS — N3941 Urge incontinence: Secondary | ICD-10-CM | POA: Diagnosis not present

## 2017-07-21 MED ORDER — FESOTERODINE FUMARATE ER 4 MG PO TB24: 4.0000 mg | ORAL_TABLET | Freq: Every day | ORAL | 11 refills | Status: DC

## 2017-07-21 NOTE — Progress Notes (Signed)
07/21/2017 3:08 PM   Chase Cabrera Nov 27, 1945 [id number]  Referring provider: Coral Spikes, DO [address] [place], [place] [address]  Chief Complaint  Patient presents with  . Urinary Incontinence    HPI: The patient is a 71 year old gentleman with past medical history of BPH on Flomax, coronary artery disease status post CABG, diabetes, and peripheral neuropathy presents today for follow up of urinary incontinence.  This has been going on approximately 2 years. He does ssociate the urge to urinate with the incontinence. He has to wear depends. He was originally started on Flomax for this issue by another urologist though it did not help. He feels he has a good stream and empties his bladder. He has no hesitancy. His PVR was 19 cc at his last follow up. DRE was 2 + benign.  He was started on Myrbetriq at his last visit for the above issues.  It does not help him. No change in his previous symptoms.  Note, the patient has severe neuropathy in his lower extremities and requires a walker for ambulation.     PMH: Past Medical History:  Diagnosis Date  . Arthritis   . Atrial fibrillation ([place])   . CAD (coronary artery disease)   . CHF (congestive heart failure) ([employer])   . Chicken pox   . Corneal dystrophy    bilateral  . Diabetes ([place])   . Dysrhythmia    A-FIB AND PAF  . GERD (gastroesophageal reflux disease)   . Gout   . History of shingles   . Hyperlipemia   . Hypertension   . Myocardial infarction (H[facility])    x4  . Peripheral neuropathy   . Peripheral neuropathy   . Peripheral neuropathy   . PUD (peptic ulcer disease)   . Renal artery stenosis ([place])   . Renal artery stenosis ([place])   . Salzmann's nodular dystrophy 2012  . Spinal stenosis     Surgical History: Past Surgical History:  Procedure Laterality Date  . APPLICATION VERTERBRAL DEFECT PROSTHETIC  05/01/2013  . ARTHRODESIS ANTERIOR LUMBAR SPINE  05/01/2013  . BACK SURGERY     lumbar fusion    . CARDIAC CATHETERIZATION    . CARDIAC ELECTROPHYSIOLOGY STUDY AND ABLATION    . CARDIAC SURGERY    . CARDIOVERSION    . CHOLECYSTECTOMY    . CORNEAL EYE SURGERY Bilateral 07/28/11    09/22/2011  . CORONARY ANGIOPLASTY    . CORONARY ANGIOPLASTY WITH STENT PLACEMENT  03/28/2015   Distal 80% to normal Stent, Dilation Balloon  . CORONARY ARTERY BYPASS GRAFT    . EYE SURGERY     bilateral cataract  . foot and ankle repair Right 2005  . FRACTURE SURGERY     ANKLE PLATE AND SCREWS  . GALLBLADER    . JOINT REPLACEMENT    . KNEE ARTHROSCOPY Right   . LUMBAR SPINE FUSION ONE LEVEL  05/03/2013  . RENAL ARTERY STENT Right   . ROTATOR CUFF REPAIR Right 2006  . TONSILLECTOMY    . TRANSCATH PLACEMENT INTRAVASCULAR STENT LEG  03/2015    Home Medications:  Allergies as of 07/21/2017      Reactions   Allopurinol Diarrhea, Other (See Comments), Nausea And Vomiting   Other Reaction: GI Upset   Atenolol Other (See Comments)   Other Reaction: bradycardia   Atorvastatin Other (See Comments)   Other Reaction: muscle aches   Ramipril Rash   Simvastatin Other (See Comments)   Other Reaction: MUSCLE ACHES (  Zocor)   Valsartan Other (See Comments)   Other Reaction: facial swelling   Rosuvastatin Other (See Comments)   Muscle aches   Cardizem [diltiazem] Rash      Medication List        Accurate as of 07/21/17  3:08 PM. Always use your most recent med list.          amLODipine 10 MG tablet Commonly known as:  NORVASC Take 10 mg by mouth daily.   azelastine 0.1 % nasal spray Commonly known as:  ASTELIN Place 2 sprays into both nostrils 2 (two) times daily. Use in each nostril as directed   BENTYL 10 MG capsule Generic drug:  dicyclomine Take 10 mg by mouth 4 (four) times daily.   carvedilol 25 MG tablet Commonly known as:  COREG take 1 tablet by mouth twice a day   cetirizine 10 MG tablet Commonly known as:  ZYRTEC Take 10 mg by mouth daily.   clopidogrel 75 MG tablet Commonly  known as:  PLAVIX Take 75 mg by mouth daily.   colchicine 0.6 MG tablet TAKE 1 TABLET BY MOUTH ONCE DAILY   diclofenac sodium 1 % Gel Commonly known as:  VOLTAREN Apply 2 g topically 4 (four) times daily.   fesoterodine 4 MG Tb24 tablet Commonly known as:  TOVIAZ Take 1 tablet (4 mg total) daily by mouth.   furosemide 20 MG tablet Commonly known as:  LASIX take 1 tablet by mouth once daily   gabapentin 300 MG capsule Commonly known as:  NEURONTIN take 1 capsule by mouth three times a day   hydrALAZINE 100 MG tablet Commonly known as:  APRESOLINE take 1 tablet by mouth three times a day   insulin lispro 100 UNIT/ML KiwkPen Commonly known as:  HUMALOG Take 15 - 25 units up to three times daily as directed   isosorbide mononitrate 120 MG 24 hr tablet Commonly known as:  IMDUR TAKE 1 TABLET BY MOUTH ONCE DAILY   LEVEMIR FLEXTOUCH 100 UNIT/ML Pen Generic drug:  Insulin Detemir   mesalamine 1.2 g EC tablet Commonly known as:  LIALDA Take 1.2 g by mouth daily with breakfast.   metFORMIN 1000 MG tablet Commonly known as:  GLUCOPHAGE Take 1 tablet by mouth 2 (two) times daily.   mirabegron ER 25 MG Tb24 tablet Commonly known as:  MYRBETRIQ Take 1 tablet (25 mg total) by mouth daily.   MULTI-VITAMINS Tabs Take 1 tablet by mouth daily.   nitroGLYCERIN 0.4 MG SL tablet Commonly known as:  NITROSTAT Place 1 tablet under the tongue every 5 (five) minutes x 3 doses as needed. For chest pain.  Call 911 if no relief after 3 tablets   omeprazole 40 MG capsule Commonly known as:  PRILOSEC Take 40 mg by mouth daily.   potassium chloride SA 20 MEQ tablet Commonly known as:  K-DUR,KLOR-CON take 2 tablets by mouth twice a day   pravastatin 40 MG tablet Commonly known as:  PRAVACHOL take 1 tablet by mouth once daily   probenecid 500 MG tablet Commonly known as:  BENEMID Take 1 tablet by mouth 2 (two) times daily.   tamsulosin 0.4 MG Caps capsule Commonly known as:   FLOMAX take 1 capsule by mouth once daily   vitamin C 500 MG tablet Commonly known as:  ASCORBIC ACID Take 1,000 mg by mouth daily.   Vitamin D (Ergocalciferol) 50000 units Caps capsule Commonly known as:  DRISDOL Take 1 capsule (50,000 Units total) by mouth every 7 (  seven) days.   Vitamin E 400 units Tabs Take 400 Units by mouth 2 (two) times daily.   warfarin 4 MG tablet Commonly known as:  COUMADIN 1 tablet Tuesday, Thursday, Saturday, and Sunday.   warfarin 3 MG tablet Commonly known as:  COUMADIN 1 tablet  Monday, Wednesday, Friday.       Allergies:  Allergies  Allergen Reactions  . Allopurinol Diarrhea, Other (See Comments) and Nausea And Vomiting    Other Reaction: GI Upset  . Atenolol Other (See Comments)    Other Reaction: bradycardia  . Atorvastatin Other (See Comments)    Other Reaction: muscle aches  . Ramipril Rash  . Simvastatin Other (See Comments)    Other Reaction: MUSCLE ACHES (Zocor)  . Valsartan Other (See Comments)    Other Reaction: facial swelling  . Rosuvastatin Other (See Comments)    Muscle aches  . Cardizem [Diltiazem] Rash    Family History: Family History  Problem Relation Age of Onset  . CVA Father   . Stroke Father   . Lung cancer Mother   . Arthritis Mother   . Stomach cancer Sister   . Colon cancer Sister   . Diabetes Brother     Social History:  reports that he quit smoking about 55 years ago. His smoking use included cigarettes. He has a 3.00 pack-year smoking history. He quit smokeless tobacco use about 48 years ago. His smokeless tobacco use included chew. He reports that he does not drink alcohol or use drugs.  ROS: UROLOGY Frequent Urination?: Yes Hard to postpone urination?: No Burning/pain with urination?: No Get up at night to urinate?: Yes Leakage of urine?: Yes Urine stream starts and stops?: No Trouble starting stream?: No Do you have to strain to urinate?: No Blood in urine?: No Urinary tract  infection?: No Sexually transmitted disease?: No Injury to kidneys or bladder?: No Painful intercourse?: No Weak stream?: No Erection problems?: Yes Penile pain?: No  Gastrointestinal Nausea?: No Vomiting?: No Indigestion/heartburn?: No Diarrhea?: No Constipation?: No  Constitutional Fever: No Night sweats?: No Weight loss?: No Fatigue?: No  Skin Skin rash/lesions?: No Itching?: No  Eyes Blurred vision?: No Double vision?: No  Ears/Nose/Throat Sore throat?: No Sinus problems?: No  Hematologic/Lymphatic Swollen glands?: No Easy bruising?: Yes  Cardiovascular Leg swelling?: No Chest pain?: No  Respiratory Cough?: Yes Shortness of breath?: No  Endocrine Excessive thirst?: No  Musculoskeletal Back pain?: No Joint pain?: No  Neurological Headaches?: No Dizziness?: No  Psychologic Depression?: No Anxiety?: No  Physical Exam: BP (!) 119/56 (BP Location: Right Arm, Patient Position: Sitting, Cuff Size: Normal)   Pulse (!) 55   Ht 5' 8"  (1.727 m)   Wt 191 lb 9.6 oz (86.9 kg)   BMI 29.13 kg/m   Constitutional:  Alert and oriented, No acute distress. HEENT: [place] AT, moist mucus membranes.  Trachea midline, no masses. Cardiovascular: No clubbing, cyanosis, or edema. Respiratory: Normal respiratory effort, no increased work of breathing. GI: Abdomen is soft, nontender, nondistended, no abdominal masses GU: No CVA tenderness.  Skin: No rashes, bruises or suspicious lesions. Lymph: No cervical or inguinal adenopathy. Neurologic: Grossly intact, no focal deficits, moving all 4 extremities. Psychiatric: Normal mood and affect.  Laboratory Data: Lab Results  Component Value Date   WBC 6.4 04/12/2017   HGB 13.8 04/12/2017   HCT 39.9 (L) 04/12/2017   MCV 96.1 04/12/2017   PLT 208 04/12/2017    Lab Results  Component Value Date   CREATININE 0.94 03/24/2016  No results found for: PSA  No results found for: TESTOSTERONE  No results found for:  HGBA1C  Urinalysis    Component Value Date/Time   COLORURINE YELLOW (A) 10/28/2015 1425   APPEARANCEUR Clear 04/14/2017 1428   LABSPEC 1.006 10/28/2015 1425   PHURINE 5.0 10/28/2015 1425   GLUCOSEU Negative 04/14/2017 1428   HGBUR NEGATIVE 10/28/2015 1425   BILIRUBINUR Negative 04/14/2017 1428   KETONESUR NEGATIVE 10/28/2015 1425   PROTEINUR Negative 04/14/2017 [place] 10/28/2015 1425   NITRITE Negative 04/14/2017 1428   NITRITE NEGATIVE 10/28/2015 1425   LEUKOCYTESUR Trace (A) 04/14/2017 1428     Assessment & Plan:    1. Urge incontinence Patient has failed Myrbetriq so we will stop this medication at this time.  We will try him on Toviaz 4 mg daily.  If this is cost prohibitive, he will ask the pharmacist what anticholinergics are covered under his insurance.  He will follow-up in 3 months to assess his progress.  He is still having symptoms at that time, I think he would benefit from urodynamic studies followed by office cystoscopy.  His peripheral neuropathy may be making his actual clinical picture a little more difficult to understand.  2. BPH Continue Flomax for now. We ca consider stopping this medication after getting the above symptoms under control.  Return in about 3 months (around 10/21/2017).  Nickie Retort, MD  [facility] [address] [place], [place] [address] [phone]

## 2017-07-29 ENCOUNTER — Other Ambulatory Visit: Payer: Self-pay | Admitting: Family Medicine

## 2017-08-02 ENCOUNTER — Ambulatory Visit: Payer: Medicare Other | Admitting: Internal Medicine

## 2017-08-03 ENCOUNTER — Telehealth: Payer: Self-pay

## 2017-08-03 NOTE — Telephone Encounter (Signed)
Pt insurance will not cover toviaz. Insurance company wants pt to try oxybutynin, tolterodine, or trospium. Please advise.

## 2017-08-08 ENCOUNTER — Ambulatory Visit (INDEPENDENT_AMBULATORY_CARE_PROVIDER_SITE_OTHER): Payer: Medicare Other | Admitting: *Deleted

## 2017-08-08 ENCOUNTER — Other Ambulatory Visit: Payer: Medicare Other

## 2017-08-08 DIAGNOSIS — Z23 Encounter for immunization: Secondary | ICD-10-CM

## 2017-08-08 NOTE — Addendum Note (Signed)
Addended by: Arby Barrette on: 08/08/2017 04:03 PM   Modules accepted: Orders

## 2017-08-09 DIAGNOSIS — Z23 Encounter for immunization: Secondary | ICD-10-CM

## 2017-08-09 MED ORDER — TOLTERODINE TARTRATE 2 MG PO TABS: 2.0000 mg | ORAL_TABLET | Freq: Two times a day (BID) | ORAL | 11 refills | Status: DC

## 2017-08-09 NOTE — Telephone Encounter (Signed)
LMOM- medication sent to pharmacy

## 2017-08-10 NOTE — Telephone Encounter (Signed)
Pt returned call and I read message that Rx was sent to pharmacy.

## 2017-08-10 NOTE — Telephone Encounter (Signed)
LMOM

## 2017-08-11 ENCOUNTER — Other Ambulatory Visit: Payer: Medicare Other

## 2017-08-12 ENCOUNTER — Other Ambulatory Visit (INDEPENDENT_AMBULATORY_CARE_PROVIDER_SITE_OTHER): Payer: Medicare Other

## 2017-08-12 DIAGNOSIS — Z7901 Long term (current) use of anticoagulants: Secondary | ICD-10-CM | POA: Diagnosis not present

## 2017-08-12 DIAGNOSIS — Z5181 Encounter for therapeutic drug level monitoring: Secondary | ICD-10-CM | POA: Diagnosis not present

## 2017-08-12 LAB — PROTIME-INR
INR: 2.3 ratio — ABNORMAL HIGH (ref 0.8–1.0)
Prothrombin Time: 24.4 s — ABNORMAL HIGH (ref 9.6–13.1)

## 2017-08-17 ENCOUNTER — Ambulatory Visit (INDEPENDENT_AMBULATORY_CARE_PROVIDER_SITE_OTHER): Payer: Medicare Other | Admitting: Urology

## 2017-08-17 ENCOUNTER — Encounter: Payer: Self-pay | Admitting: Urology

## 2017-08-17 VITALS — BP 133/60 | HR 68 | Ht 68.0 in | Wt 188.8 lb

## 2017-08-17 DIAGNOSIS — R3129 Other microscopic hematuria: Secondary | ICD-10-CM

## 2017-08-17 DIAGNOSIS — I2510 Atherosclerotic heart disease of native coronary artery without angina pectoris: Secondary | ICD-10-CM

## 2017-08-17 DIAGNOSIS — N401 Enlarged prostate with lower urinary tract symptoms: Secondary | ICD-10-CM

## 2017-08-17 DIAGNOSIS — R102 Pelvic and perineal pain: Secondary | ICD-10-CM | POA: Diagnosis not present

## 2017-08-17 DIAGNOSIS — N138 Other obstructive and reflux uropathy: Secondary | ICD-10-CM

## 2017-08-17 DIAGNOSIS — N3941 Urge incontinence: Secondary | ICD-10-CM

## 2017-08-17 DIAGNOSIS — R300 Dysuria: Secondary | ICD-10-CM

## 2017-08-17 LAB — URINALYSIS, COMPLETE
Bilirubin, UA: NEGATIVE
GLUCOSE, UA: NEGATIVE
Ketones, UA: NEGATIVE
Leukocytes, UA: NEGATIVE
Nitrite, UA: NEGATIVE
PH UA: 5.5 (ref 5.0–7.5)
RBC, UA: NEGATIVE
Specific Gravity, UA: 1.025 (ref 1.005–1.030)
UUROB: 0.2 mg/dL (ref 0.2–1.0)

## 2017-08-17 LAB — MICROSCOPIC EXAMINATION: WBC, UA: NONE SEEN /hpf (ref 0–?)

## 2017-08-17 LAB — BLADDER SCAN AMB NON-IMAGING

## 2017-08-17 NOTE — Progress Notes (Signed)
08/17/2017 12:03 PM   Chase Cabrera [birth year] [id number]  Referring provider: McLean-Scocuzza, Nino Glow, MD [place], [place] [address]  Chief Complaint  Patient presents with  . Dysuria    HPI: 71 yo WM with BPH with LU TS and urge incontinence who presents today for an urgent appointment for pain with urination and urinary hesitancy with his wife, [name].  Patient was seen by Dr. Pilar Jarvis on 07/21/2017.   See below.   The patient is a 71 year old gentleman with past medical history of BPH on Flomax, coronary artery disease status post CABG, diabetes, and peripheral neuropathy presents today for follow up of urinary incontinence.  This has been going on approximately 2 years. He does associate the urge to urinate with the incontinence. He has to wear depends. He was originally started on Flomax for this issue by another urologist though it did not help. He feels he has a good stream and empties his bladder. He has no hesitancy. His PVR was 19 cc at his last follow up. DRE was 2 + benign.  He was started on Myrbetriq at his last visit for the above issues.  It does not help him. No change in his previous symptoms.  Note, the patient has severe neuropathy in his lower extremities and requires a walker for ambulation.  He was given samples of Toviaz at that visit and continue the tamsulosin.  He was to follow up in 3 months.  His insurance would not cover the Toviaz, so a prescription was sent in for Detrol.  He will start that medication once he has finished the [place] sample.    Today, he is having urgency, painful urination, nocturia, leakage of urine, intermittency and hesitancy.  He states his urine has a foul smell to it.  He has not seen blood in his urine.  This has been occurring for the last 3 to 4 days.  His UA is positive for 11-30 RBC's.   His PVR is 94 mL.  He has not had fevers, chills, nausea or vomiting.    His I PSS score is 25/5.    IPSS    Row Name  08/17/17 1100         International Prostate Symptom Score   How often have you had the sensation of not emptying your bladder?  About half the time     How often have you had to urinate less than every two hours?  Almost always     How often have you found you stopped and started again several times when you urinated?  Almost always     How often have you found it difficult to postpone urination?  Almost always     How often have you had a weak urinary stream?  Almost always     How often have you had to strain to start urination?  Not at All     How many times did you typically get up at night to urinate?  2 Times     Total IPSS Score  25       Quality of Life due to urinary symptoms   If you were to spend the rest of your life with your urinary condition just the way it is now how would you feel about that?  Unhappy        Score:  1-7 Mild 8-19 Moderate 20-35 Severe       PMH: Past Medical History:  Diagnosis Date  . Arthritis   .  Atrial fibrillation ([facility])   . CAD (coronary artery disease)   . CHF (congestive heart failure) ([place])   . Chicken pox   . Corneal dystrophy    bilateral  . Diabetes ([facility])   . Dysrhythmia    A-FIB AND PAF  . GERD (gastroesophageal reflux disease)   . Gout   . History of shingles   . Hyperlipemia   . Hypertension   . Myocardial infarction ([facility])    x4  . Peripheral neuropathy   . Peripheral neuropathy   . Peripheral neuropathy   . PUD (peptic ulcer disease)   . Renal artery stenosis ([facility])   . Renal artery stenosis ([place])   . Salzmann's nodular dystrophy 2012  . Spinal stenosis     Surgical History: Past Surgical History:  Procedure Laterality Date  . APPLICATION VERTERBRAL DEFECT PROSTHETIC  05/01/2013  . ARTHRODESIS ANTERIOR LUMBAR SPINE  05/01/2013  . BACK SURGERY     lumbar fusion  . CARDIAC CATHETERIZATION    . CARDIAC ELECTROPHYSIOLOGY STUDY AND ABLATION    . CARDIAC SURGERY    . CARDIOVERSION    . CHOLECYSTECTOMY    .  COLONOSCOPY WITH PROPOFOL N/A 04/09/2016   Procedure: COLONOSCOPY WITH PROPOFOL;  Surgeon: Lollie Sails, MD;  Location: [facility] ENDOSCOPY;  Service: Endoscopy;  Laterality: N/A;  . CORNEAL EYE SURGERY Bilateral 07/28/11    09/22/2011  . CORONARY ANGIOPLASTY    . CORONARY ANGIOPLASTY WITH STENT PLACEMENT  03/28/2015   Distal 80% to normal Stent, Dilation Balloon  . CORONARY ARTERY BYPASS GRAFT    . EYE SURGERY     bilateral cataract  . foot and ankle repair Right 2005  . FRACTURE SURGERY     ANKLE PLATE AND SCREWS  . GALLBLADER    . JOINT REPLACEMENT    . KNEE ARTHROSCOPY Right   . LUMBAR SPINE FUSION ONE LEVEL  05/03/2013  . RENAL ARTERY STENT Right   . ROTATOR CUFF REPAIR Right 2006  . TONSILLECTOMY    . TOTAL HIP ARTHROPLASTY Left 11/11/2015   Procedure: TOTAL HIP ARTHROPLASTY ANTERIOR APPROACH;  Surgeon: Hessie Knows, MD;  Location: [facility] ORS;  Service: Orthopedics;  Laterality: Left;  . TRANSCATH PLACEMENT INTRAVASCULAR STENT LEG  03/2015    Home Medications:  Allergies as of 08/17/2017      Reactions   Allopurinol Diarrhea, Other (See Comments), Nausea And Vomiting   Other Reaction: GI Upset   Atenolol Other (See Comments)   Other Reaction: bradycardia   Atorvastatin Other (See Comments)   Other Reaction: muscle aches   Ramipril Rash   Rosuvastatin Other (See Comments)   Muscle aches   Simvastatin Other (See Comments)   Other Reaction: MUSCLE ACHES (Zocor)   Valsartan Other (See Comments)   Other Reaction: facial swelling   Cardizem [diltiazem] Rash      Medication List        Accurate as of 08/17/17 12:03 PM. Always use your most recent med list.          amLODipine 10 MG tablet Commonly known as:  NORVASC Take 10 mg by mouth daily.   azelastine 0.1 % nasal spray Commonly known as:  ASTELIN Place 2 sprays into both nostrils 2 (two) times daily. Use in each nostril as directed   BENTYL 10 MG capsule Generic drug:  dicyclomine Take 10 mg by mouth 3 (three)  times daily before meals.   carvedilol 25 MG tablet Commonly known as:  COREG take 1 tablet by mouth twice  a day   cetirizine 10 MG tablet Commonly known as:  ZYRTEC Take 10 mg by mouth daily.   clopidogrel 75 MG tablet Commonly known as:  PLAVIX Take 75 mg by mouth daily.   colchicine 0.6 MG tablet TAKE 1 TABLET BY MOUTH ONCE DAILY   diclofenac sodium 1 % Gel Commonly known as:  VOLTAREN Apply 2 g topically 4 (four) times daily as needed.   fesoterodine 4 MG Tb24 tablet Commonly known as:  TOVIAZ Take 1 tablet (4 mg total) daily by mouth.   furosemide 20 MG tablet Commonly known as:  LASIX take 1 tablet by mouth once daily   gabapentin 300 MG capsule Commonly known as:  NEURONTIN take 1 capsule by mouth three times a day   hydrALAZINE 100 MG tablet Commonly known as:  APRESOLINE take 1 tablet by mouth three times a day   insulin lispro 100 UNIT/ML KiwkPen Commonly known as:  HUMALOG Take 15 - 25 units up to three times daily as directed   isosorbide mononitrate 120 MG 24 hr tablet Commonly known as:  IMDUR TAKE 1 TABLET BY MOUTH ONCE DAILY   LEVEMIR FLEXTOUCH 100 UNIT/ML Pen Generic drug:  Insulin Detemir Inject 90 Units into the skin daily at 10 pm.   mesalamine 1.2 g EC tablet Commonly known as:  LIALDA Take 2.4 g by mouth daily with breakfast.   metFORMIN 1000 MG tablet Commonly known as:  GLUCOPHAGE Take 1 tablet by mouth 2 (two) times daily.   MULTI-VITAMINS Tabs Take 1 tablet by mouth daily.   nitroGLYCERIN 0.4 MG SL tablet Commonly known as:  NITROSTAT Place 1 tablet under the tongue every 5 (five) minutes x 3 doses as needed. For chest pain.  Call 911 if no relief after 3 tablets   omeprazole 40 MG capsule Commonly known as:  PRILOSEC Take 40 mg by mouth daily.   potassium chloride SA 20 MEQ tablet Commonly known as:  K-DUR,KLOR-CON take 2 tablets by mouth twice a day   pravastatin 40 MG tablet Commonly known as:  PRAVACHOL take 1  tablet by mouth once daily   probenecid 500 MG tablet Commonly known as:  BENEMID Take 1 tablet by mouth 2 (two) times daily.   tamsulosin 0.4 MG Caps capsule Commonly known as:  FLOMAX take 1 capsule by mouth once daily   tolterodine 2 MG tablet Commonly known as:  DETROL Take 1 tablet (2 mg total) by mouth 2 (two) times daily.   vitamin C 500 MG tablet Commonly known as:  ASCORBIC ACID Take 1,000 mg by mouth daily.   Vitamin D (Ergocalciferol) 50000 units Caps capsule Commonly known as:  DRISDOL Take 1 capsule (50,000 Units total) by mouth every 7 (seven) days.   Vitamin E 400 units Tabs Take 400 Units by mouth 2 (two) times daily.   warfarin 4 MG tablet Commonly known as:  COUMADIN 1 tablet Tuesday, Thursday, Saturday, and Sunday.   warfarin 3 MG tablet Commonly known as:  COUMADIN 1 tablet  Monday, Wednesday, Friday.       Allergies:  Allergies  Allergen Reactions  . Allopurinol Diarrhea, Other (See Comments) and Nausea And Vomiting    Other Reaction: GI Upset  . Atenolol Other (See Comments)    Other Reaction: bradycardia  . Atorvastatin Other (See Comments)    Other Reaction: muscle aches  . Ramipril Rash  . Rosuvastatin Other (See Comments)    Muscle aches  . Simvastatin Other (See Comments)  Other Reaction: MUSCLE ACHES (Zocor)  . Valsartan Other (See Comments)    Other Reaction: facial swelling  . Cardizem [Diltiazem] Rash    Family History: Family History  Problem Relation Age of Onset  . CVA Father   . Stroke Father   . Lung cancer Mother   . Arthritis Mother   . Stomach cancer Sister   . Colon cancer Sister   . Diabetes Brother     Social History:  reports that he quit smoking about 55 years ago. His smoking use included cigarettes. He has a 3.00 pack-year smoking history. He quit smokeless tobacco use about 48 years ago. His smokeless tobacco use included chew. He reports that he does not drink alcohol or use  drugs.  ROS: UROLOGY Frequent Urination?: No Hard to postpone urination?: Yes Burning/pain with urination?: Yes Get up at night to urinate?: Yes Leakage of urine?: Yes Urine stream starts and stops?: Yes Trouble starting stream?: Yes Do you have to strain to urinate?: No Blood in urine?: No Urinary tract infection?: No Sexually transmitted disease?: No Injury to kidneys or bladder?: No Painful intercourse?: No Weak stream?: No Erection problems?: No Penile pain?: No  Gastrointestinal Nausea?: No Vomiting?: No Indigestion/heartburn?: No Diarrhea?: No Constipation?: No  Constitutional Fever: No Night sweats?: No Weight loss?: No Fatigue?: No  Skin Skin rash/lesions?: No Itching?: No  Eyes Blurred vision?: No Double vision?: No  Ears/Nose/Throat Sore throat?: No Sinus problems?: No  Hematologic/Lymphatic Swollen glands?: No Easy bruising?: No  Cardiovascular Leg swelling?: No Chest pain?: No  Respiratory Cough?: No Shortness of breath?: No  Endocrine Excessive thirst?: No  Musculoskeletal Back pain?: No Joint pain?: No  Neurological Headaches?: No Dizziness?: No  Psychologic Depression?: No Anxiety?: No  Physical Exam: BP 133/60   Pulse 68   Ht 5' 8"  (1.727 m)   Wt 188 lb 12.8 oz (85.6 kg)   BMI 28.71 kg/m   Constitutional: Well nourished. Alert and oriented, No acute distress. HEENT: [place] AT, moist mucus membranes. Trachea midline, no masses. Cardiovascular: No clubbing, cyanosis, or edema. Respiratory: Normal respiratory effort, no increased work of breathing. GI: Abdomen is soft, non tender, non distended, no abdominal masses. Liver and spleen not palpable.  No hernias appreciated.  Stool sample for occult testing is not indicated.   GU: No CVA tenderness.  No bladder fullness or masses.  Patient with uncircumcised phallus.  Foreskin easily retracted Urethral meatus is patent.  No penile discharge. No penile lesions or rashes.  Scrotum without lesions, cysts, rashes and/or edema.  Testicles are located scrotally bilaterally. No masses are appreciated in the testicles. Left and right epididymis are normal. Rectal: Patient with  normal sphincter tone. Anus and perineum without scarring or rashes. No rectal masses are appreciated. Prostate is approximately 55 grams, no nodules are appreciated. Seminal vesicles are normal. Skin: No rashes, bruises or suspicious lesions. Lymph: No cervical or inguinal adenopathy. Neurologic: Grossly intact, no focal deficits, moving all 4 extremities. Psychiatric: Normal mood and affect.  Laboratory Data: Lab Results  Component Value Date   WBC 6.4 04/12/2017   HGB 13.8 04/12/2017   HCT 39.9 (L) 04/12/2017   MCV 96.1 04/12/2017   PLT 208 04/12/2017     Urinalysis CATH UA positive for 11-30 RBC's.  See Epic.   Assessment & Plan:    1. Dysuria  - UA is positive for 11-30 RBC's - will send for culture  2. Suprapubic pain  - may be due to infection - urine culture  is pending  3. Microscopic hematuria  - may be due to infection and/or trauma of cath, but patient is having suprapubic discomfort and has failed OAB medications so far for his symptoms of urgency   - if urine culture is negative, will pursue hematuria work up  - I explained to the patient that there are a number of causes that can be associated with blood in the urine, such as stones,  BPH, UTI's, damage to the urinary tract and/or cancer.  - The AUA guidelines state that a CT urogram is the preferred imaging study to evaluate hematuria.  - I explained to the patient that a contrast material will be injected into a vein and that in rare instances, an allergic reaction can result and may even life threatening   The patient denies any allergies to contrast, iodine and/or seafood and is taking metformin.  - Following the imaging study,  I've recommended a cystoscopy. I described how this is performed, typically in an  office setting with a flexible cystoscope. We described the risks, benefits, and possible side effects, the most common of which is a minor amount of blood in the urine and/or burning which usually resolves in 24 to 48 hours.    - The patient had the opportunity to ask questions which were answered.   - UA  - Urine culture  - he is willing to undergo these studies if urine culture is negative  4. Urge incontinence                      - continue Toviaz 4 mg daily and pick up the Detrol  If this is cost prohibitive, he will ask the pharmacist what anticholinergics are covered under his insurance.                  - He is scheduled to follow-up in 3 months pending urine culture results and/or hematuria work up results                  -He may benefit from urodynamic studies if the above studies are negative as his peripheral neuropathy may be making his actual clinical picture a little more difficult to understand.  5. BPH Continue Flomax for now. We can consider stopping this medication after getting the above symptoms under control.  Return for pending urine culture.  Zara Council, [facility] [address], [place], [place] [address] [phone]

## 2017-08-17 NOTE — Progress Notes (Signed)
In and Out Catheterization  Patient is present today for a I & O catheterization due to inability to void for specimen. Patient was cleaned and prepped in a sterile fashion with betadine and Lidocaine 2% jelly was instilled into the urethra.  A 14FR cath was inserted no complications were noted , 57m of urine return was noted, urine was Dark Yellow in color. A clean urine sample was collected for UA. Bladder was drained  And catheter was removed with out difficulty.    Performed by: AReece Packer RN

## 2017-08-20 LAB — CULTURE, URINE COMPREHENSIVE

## 2017-08-22 ENCOUNTER — Other Ambulatory Visit: Payer: Self-pay | Admitting: Urology

## 2017-08-22 DIAGNOSIS — R3129 Other microscopic hematuria: Secondary | ICD-10-CM

## 2017-08-22 NOTE — Progress Notes (Signed)
Orders in for CTU.  Needs to be scheduled for a cystoscopy.

## 2017-08-23 ENCOUNTER — Telehealth: Payer: Self-pay | Admitting: Urology

## 2017-08-23 ENCOUNTER — Other Ambulatory Visit: Payer: Self-pay | Admitting: Urology

## 2017-08-23 DIAGNOSIS — R3129 Other microscopic hematuria: Secondary | ICD-10-CM

## 2017-08-23 DIAGNOSIS — N200 Calculus of kidney: Secondary | ICD-10-CM

## 2017-08-23 NOTE — Progress Notes (Unsigned)
Orders in for RUS.

## 2017-08-23 NOTE — Telephone Encounter (Signed)
Patient called the office and left a message that he passed a kidney stone on Saturday night.    Is there anything else they need to do?

## 2017-08-23 NOTE — Telephone Encounter (Signed)
I will need an order for the RUS please    thanks

## 2017-08-23 NOTE — Telephone Encounter (Signed)
Do you want pt to still have hematuria work up?

## 2017-08-23 NOTE — Telephone Encounter (Signed)
We will put that on hold for now pending RUS and UA results.

## 2017-08-23 NOTE — Telephone Encounter (Signed)
We will need a RUS in one month and an UA.

## 2017-08-24 NOTE — Telephone Encounter (Signed)
Orders placed.

## 2017-09-01 ENCOUNTER — Ambulatory Visit: Payer: Medicare Other | Admitting: Internal Medicine

## 2017-09-06 ENCOUNTER — Other Ambulatory Visit: Payer: Self-pay | Admitting: Family Medicine

## 2017-09-09 ENCOUNTER — Telehealth: Payer: Self-pay

## 2017-09-09 NOTE — Telephone Encounter (Signed)
PA for tolterodine has been DENIED.

## 2017-09-13 ENCOUNTER — Other Ambulatory Visit: Payer: Self-pay | Admitting: Family Medicine

## 2017-09-16 NOTE — Telephone Encounter (Signed)
Last OV 04/20/17 last filed under historical

## 2017-09-19 DIAGNOSIS — R197 Diarrhea, unspecified: Secondary | ICD-10-CM | POA: Diagnosis not present

## 2017-09-20 DIAGNOSIS — R197 Diarrhea, unspecified: Secondary | ICD-10-CM | POA: Diagnosis not present

## 2017-09-23 ENCOUNTER — Ambulatory Visit
Admission: RE | Admit: 2017-09-23 | Discharge: 2017-09-23 | Disposition: A | Discharge status: Home / Self Care | Payer: Medicare Other | Source: Ambulatory Visit | Attending: Urology | Admitting: Urology

## 2017-09-23 DIAGNOSIS — N281 Cyst of kidney, acquired: Secondary | ICD-10-CM | POA: Insufficient documentation

## 2017-09-23 DIAGNOSIS — R3129 Other microscopic hematuria: Secondary | ICD-10-CM | POA: Diagnosis not present

## 2017-09-25 NOTE — Progress Notes (Signed)
09/26/2017 1:45 PM   Chase Cabrera [birth year] [id number]  Referring provider: McLean-Scocuzza, Nino Glow, MD [place], [place] [address]  Chief Complaint  Patient presents with  . Results  . Benign Prostatic Hypertrophy    HPI: 72 yo WM with BPH with LU TS and a history of nephrolithiasis who presents today for discussion on his RUS results and recheck UA for hematuria.  Patient was seen by Dr. Pilar Jarvis on 07/21/2017.   See below.   The patient is a 72 year old gentleman with past medical history of BPH on Flomax, coronary artery disease status post CABG, diabetes, and peripheral neuropathy presents today for follow up of urinary incontinence.  This has been going on approximately 2 years. He does associate the urge to urinate with the incontinence. He has to wear depends. He was originally started on Flomax for this issue by another urologist though it did not help. He feels he has a good stream and empties his bladder. He has no hesitancy. His PVR was 19 cc at his last follow up. DRE was 2 + benign.  He was started on Myrbetriq at his last visit for the above issues.  It does not help him. No change in his previous symptoms.  Note, the patient has severe neuropathy in his lower extremities and requires a walker for ambulation.  He was given samples of Toviaz at that visit and continue the tamsulosin.  He was to follow up in 3 months.  His insurance would not cover the Toviaz, so a prescription was sent in for Detrol.  He will start that medication once he has finished the [place] sample.    He then was having significant urinary symptoms.  He was having urgency, painful urination, nocturia, leakage of urine, intermittency and hesitancy.  He stated his urine has a foul smell to it.  He had not seen blood in his urine.  This had been occurring for the last 3 to 4 days.  His UA was positive for 11-30 RBC's.   His PVR was 94 mL.  He had not had fevers, chills, nausea or vomiting.   His urine culture was negative.  He was scheduled for a hematuria workup with CTU and cystoscopy, but he passed a stone in the interim.    His I PSS previous score was 25/5.   His I PSS score today is 23/4.    His complaints today are urgency, nocturia, incontinence and hesitancy.   He is drinking 2 L of diet colas everyday.    RUS performed on 09/23/2017 noted a 15 mm cyst in the lower pole the right kidney. No other abnormalities.  His UA today is negative.    IPSS    Row Name 08/17/17 1100 09/26/17 1300       International Prostate Symptom Score   How often have you had the sensation of not emptying your bladder?  About half the time  Almost always    How often have you had to urinate less than every two hours?  Almost always  About half the time    How often have you found you stopped and started again several times when you urinated?  Almost always  Almost always    How often have you found it difficult to postpone urination?  Almost always  Almost always    How often have you had a weak urinary stream?  Almost always  About half the time    How often have you had  to strain to start urination?  Not at All  Not at All    How many times did you typically get up at night to urinate?  2 Times  2 Times    Total IPSS Score  25  23      Quality of Life due to urinary symptoms   If you were to spend the rest of your life with your urinary condition just the way it is now how would you feel about that?  Unhappy  Mostly Disatisfied       Score:  1-7 Mild 8-19 Moderate 20-35 Severe       PMH: Past Medical History:  Diagnosis Date  . Arthritis   . Atrial fibrillation ([facility])   . CAD (coronary artery disease)   . CHF (congestive heart failure) ([facility])   . Chicken pox   . Corneal dystrophy    bilateral  . Diabetes ([facility])   . Dysrhythmia    A-FIB AND PAF  . GERD (gastroesophageal reflux disease)   . Gout   . History of shingles   . Hyperlipemia   . Hypertension   . Myocardial  infarction ([facility])    x4  . Peripheral neuropathy   . Peripheral neuropathy   . Peripheral neuropathy   . PUD (peptic ulcer disease)   . Renal artery stenosis ([facility])   . Renal artery stenosis ([facility])   . Salzmann's nodular dystrophy 2012  . Spinal stenosis     Surgical History: Past Surgical History:  Procedure Laterality Date  . APPLICATION VERTERBRAL DEFECT PROSTHETIC  05/01/2013  . ARTHRODESIS ANTERIOR LUMBAR SPINE  05/01/2013  . BACK SURGERY     lumbar fusion  . CARDIAC CATHETERIZATION    . CARDIAC ELECTROPHYSIOLOGY STUDY AND ABLATION    . CARDIAC SURGERY    . CARDIOVERSION    . CHOLECYSTECTOMY    . COLONOSCOPY WITH PROPOFOL N/A 04/09/2016   Procedure: COLONOSCOPY WITH PROPOFOL;  Surgeon: Lollie Sails, MD;  Location: [facility] ENDOSCOPY;  Service: Endoscopy;  Laterality: N/A;  . CORNEAL EYE SURGERY Bilateral 07/28/11    09/22/2011  . CORONARY ANGIOPLASTY    . CORONARY ANGIOPLASTY WITH STENT PLACEMENT  03/28/2015   Distal 80% to normal Stent, Dilation Balloon  . CORONARY ARTERY BYPASS GRAFT    . EYE SURGERY     bilateral cataract  . foot and ankle repair Right 2005  . FRACTURE SURGERY     ANKLE PLATE AND SCREWS  . GALLBLADER    . JOINT REPLACEMENT    . KNEE ARTHROSCOPY Right   . LUMBAR SPINE FUSION ONE LEVEL  05/03/2013  . RENAL ARTERY STENT Right   . ROTATOR CUFF REPAIR Right 2006  . TONSILLECTOMY    . TOTAL HIP ARTHROPLASTY Left 11/11/2015   Procedure: TOTAL HIP ARTHROPLASTY ANTERIOR APPROACH;  Surgeon: Hessie Knows, MD;  Location: [facility] ORS;  Service: Orthopedics;  Laterality: Left;  . TRANSCATH PLACEMENT INTRAVASCULAR STENT LEG  03/2015    Home Medications:  Allergies as of 09/26/2017      Reactions   Allopurinol Diarrhea, Other (See Comments), Nausea And Vomiting   Other Reaction: GI Upset   Atenolol Other (See Comments)   Other Reaction: bradycardia   Atorvastatin Other (See Comments)   Other Reaction: muscle aches   Ramipril Rash   Rosuvastatin Other (See  Comments)   Muscle aches   Simvastatin Other (See Comments)   Other Reaction: MUSCLE ACHES (Zocor)   Valsartan Other (See Comments)   Other Reaction: facial swelling  Cardizem [diltiazem] Rash      Medication List        Accurate as of 09/26/17  1:45 PM. Always use your most recent med list.          amLODipine 10 MG tablet Commonly known as:  NORVASC TAKE 1 TABLET BY MOUTH ONCE DAILY   azelastine 0.1 % nasal spray Commonly known as:  ASTELIN Place 2 sprays into both nostrils 2 (two) times daily. Use in each nostril as directed   BENTYL 10 MG capsule Generic drug:  dicyclomine Take 10 mg by mouth 3 (three) times daily before meals.   carvedilol 25 MG tablet Commonly known as:  COREG Take 1 tablet (25 mg total) by mouth 2 (two) times daily. MUST KEEP UPCOMING APPT FOR REFILLS   cetirizine 10 MG tablet Commonly known as:  ZYRTEC Take 10 mg by mouth daily.   clopidogrel 75 MG tablet Commonly known as:  PLAVIX Take 75 mg by mouth daily.   colchicine 0.6 MG tablet TAKE 1 TABLET BY MOUTH ONCE DAILY   diclofenac sodium 1 % Gel Commonly known as:  VOLTAREN Apply 2 g topically 4 (four) times daily as needed.   fesoterodine 4 MG Tb24 tablet Commonly known as:  TOVIAZ Take 1 tablet (4 mg total) daily by mouth.   furosemide 20 MG tablet Commonly known as:  LASIX take 1 tablet by mouth once daily   gabapentin 300 MG capsule Commonly known as:  NEURONTIN take 1 capsule by mouth three times a day   hydrALAZINE 100 MG tablet Commonly known as:  APRESOLINE take 1 tablet by mouth three times a day   insulin lispro 100 UNIT/ML KiwkPen Commonly known as:  HUMALOG Take 15 - 25 units up to three times daily as directed   isosorbide mononitrate 120 MG 24 hr tablet Commonly known as:  IMDUR TAKE 1 TABLET BY MOUTH ONCE DAILY   LEVEMIR FLEXTOUCH 100 UNIT/ML Pen Generic drug:  Insulin Detemir Inject 90 Units into the skin daily at 10 pm.   metFORMIN 1000 MG  tablet Commonly known as:  GLUCOPHAGE Take 1 tablet by mouth 2 (two) times daily.   MULTI-VITAMINS Tabs Take 1 tablet by mouth daily.   nitroGLYCERIN 0.4 MG SL tablet Commonly known as:  NITROSTAT Place 1 tablet under the tongue every 5 (five) minutes x 3 doses as needed. For chest pain.  Call 911 if no relief after 3 tablets   omeprazole 40 MG capsule Commonly known as:  PRILOSEC Take 40 mg by mouth daily.   potassium chloride SA 20 MEQ tablet Commonly known as:  K-DUR,KLOR-CON take 2 tablets by mouth twice a day   pravastatin 40 MG tablet Commonly known as:  PRAVACHOL take 1 tablet by mouth once daily   probenecid 500 MG tablet Commonly known as:  BENEMID Take 1 tablet by mouth 2 (two) times daily.   tamsulosin 0.4 MG Caps capsule Commonly known as:  FLOMAX take 1 capsule by mouth once daily   tolterodine 2 MG tablet Commonly known as:  DETROL Take 1 tablet (2 mg total) by mouth 2 (two) times daily.   vitamin C 500 MG tablet Commonly known as:  ASCORBIC ACID Take 1,000 mg by mouth daily.   Vitamin D (Ergocalciferol) 50000 units Caps capsule Commonly known as:  DRISDOL Take 1 capsule (50,000 Units total) by mouth every 7 (seven) days.   Vitamin E 400 units Tabs Take 400 Units by mouth 2 (two) times daily.  warfarin 4 MG tablet Commonly known as:  COUMADIN 1 tablet Tuesday, Thursday, Saturday, and Sunday.   warfarin 3 MG tablet Commonly known as:  COUMADIN 1 tablet  Monday, Wednesday, Friday.       Allergies:  Allergies  Allergen Reactions  . Allopurinol Diarrhea, Other (See Comments) and Nausea And Vomiting    Other Reaction: GI Upset  . Atenolol Other (See Comments)    Other Reaction: bradycardia  . Atorvastatin Other (See Comments)    Other Reaction: muscle aches  . Ramipril Rash  . Rosuvastatin Other (See Comments)    Muscle aches  . Simvastatin Other (See Comments)    Other Reaction: MUSCLE ACHES (Zocor)  . Valsartan Other (See Comments)     Other Reaction: facial swelling  . Cardizem [Diltiazem] Rash    Family History: Family History  Problem Relation Age of Onset  . CVA Father   . Stroke Father   . Lung cancer Mother   . Arthritis Mother   . Stomach cancer Sister   . Colon cancer Sister   . Diabetes Brother     Social History:  reports that he quit smoking about 55 years ago. His smoking use included cigarettes. He has a 3.00 pack-year smoking history. He quit smokeless tobacco use about 48 years ago. His smokeless tobacco use included chew. He reports that he does not drink alcohol or use drugs.  ROS: UROLOGY Frequent Urination?: No Hard to postpone urination?: Yes Burning/pain with urination?: No Get up at night to urinate?: Yes Leakage of urine?: Yes Urine stream starts and stops?: Yes Trouble starting stream?: Yes Do you have to strain to urinate?: No Blood in urine?: No Urinary tract infection?: No Sexually transmitted disease?: No Injury to kidneys or bladder?: No Painful intercourse?: No Weak stream?: No Erection problems?: No Penile pain?: No  Gastrointestinal Nausea?: No Vomiting?: No Indigestion/heartburn?: No Diarrhea?: No Constipation?: No  Constitutional Fever: No Night sweats?: Yes Weight loss?: No Fatigue?: No  Skin Skin rash/lesions?: No Itching?: No  Eyes Blurred vision?: No Double vision?: No  Ears/Nose/Throat Sore throat?: No Sinus problems?: Yes  Hematologic/Lymphatic Swollen glands?: No Easy bruising?: Yes  Cardiovascular Leg swelling?: No Chest pain?: No  Respiratory Cough?: No Shortness of breath?: No  Endocrine Excessive thirst?: Yes  Musculoskeletal Back pain?: No Joint pain?: No  Neurological Headaches?: No Dizziness?: No  Psychologic Depression?: No Anxiety?: No  Physical Exam: BP (!) 156/66   Pulse 69   Ht 5' 8"  (1.727 m)   Wt 193 lb (87.5 kg)   BMI 29.35 kg/m   Constitutional: Well nourished. Alert and oriented, No acute  distress. HEENT:  AT, moist mucus membranes. Trachea midline, no masses. Cardiovascular: No clubbing, cyanosis, or edema. Respiratory: Normal respiratory effort, no increased work of breathing. Skin: No rashes, bruises or suspicious lesions. Lymph: No cervical or inguinal adenopathy. Neurologic: Grossly intact, no focal deficits, moving all 4 extremities. Psychiatric: Normal mood and affect.   Laboratory Data: Lab Results  Component Value Date   WBC 6.4 04/12/2017   HGB 13.8 04/12/2017   HCT 39.9 (L) 04/12/2017   MCV 96.1 04/12/2017   PLT 208 04/12/2017   Urinalysis Negative.    See Epic.   I have reviewed the labs.  Pertinent Imaging CLINICAL DATA:  Microscopic hematuria.  EXAM: RENAL / URINARY TRACT ULTRASOUND COMPLETE  COMPARISON:  None.  FINDINGS: Right Kidney:  Length: 11 cm. There is a 14 x 13 x 15 mm cyst in the lower pole of the  right kidney. No hydronephrosis.  Left Kidney:  Length: 11.7 cm. Echogenicity within normal limits. No mass or hydronephrosis visualized.  Bladder:  Appears normal for degree of bladder distention.  IMPRESSION: 1. There is a 15 mm cyst in the lower pole the right kidney. No other abnormalities.   Electronically Signed   By: Dorise Bullion III M.D   On: 09/23/2017 17:36  I have independently reviewed the films  Assessment & Plan:    1. History of nephrolithiasis  - stone composition is unknown  - RUS negative for further nephrolithiasis and hydronephrosis   2. Microscopic hematuria  - likely the result of the passage of the stone  - UA today is negative  3. Urge incontinence  - failed Myrbetriq and Toviaz  - patient will discontinue sodas and drink only water for the next 6 weeks                               - will pursue cystoscopy and UDS if no improvement after 6 weeks  - RTC in 6 weeks for I PSS and PVR  4. BPH with LUTS  - IPSS score is 23/4, it is slightly improved  - Continue  conservative management, avoiding bladder irritants and timed voiding's - no more sodas  - most bothersome symptoms is/are urgency  - discontinue tamsulosin 0.4 mg    - RTC in 12  months for IPSS, PVR and exam     Return in about 6 weeks (around 11/07/2017) for IPSS and PVR.  Zara Council, [facility] [address] [place], [place] [address] [phone]

## 2017-09-26 ENCOUNTER — Encounter: Payer: Self-pay | Admitting: Urology

## 2017-09-26 ENCOUNTER — Ambulatory Visit (INDEPENDENT_AMBULATORY_CARE_PROVIDER_SITE_OTHER): Payer: Medicare Other | Admitting: Urology

## 2017-09-26 VITALS — BP 156/66 | HR 69 | Ht 68.0 in | Wt 193.0 lb

## 2017-09-26 DIAGNOSIS — Z87442 Personal history of urinary calculi: Secondary | ICD-10-CM | POA: Diagnosis not present

## 2017-09-26 DIAGNOSIS — N401 Enlarged prostate with lower urinary tract symptoms: Secondary | ICD-10-CM

## 2017-09-26 DIAGNOSIS — Z87448 Personal history of other diseases of urinary system: Secondary | ICD-10-CM | POA: Diagnosis not present

## 2017-09-26 DIAGNOSIS — N138 Other obstructive and reflux uropathy: Secondary | ICD-10-CM | POA: Diagnosis not present

## 2017-09-26 DIAGNOSIS — R3915 Urgency of urination: Secondary | ICD-10-CM | POA: Diagnosis not present

## 2017-09-26 LAB — URINALYSIS, COMPLETE
Bilirubin, UA: NEGATIVE
Glucose, UA: NEGATIVE
Ketones, UA: NEGATIVE
Leukocytes, UA: NEGATIVE
Nitrite, UA: NEGATIVE
Protein, UA: NEGATIVE
RBC, UA: NEGATIVE
Specific Gravity, UA: 1.025 (ref 1.005–1.030)
Urobilinogen, Ur: 0.2 mg/dL (ref 0.2–1.0)
pH, UA: 5 (ref 5.0–7.5)

## 2017-09-27 ENCOUNTER — Telehealth: Payer: Self-pay

## 2017-09-27 NOTE — Telephone Encounter (Signed)
Pt was seen yesterday.

## 2017-09-27 NOTE — Telephone Encounter (Signed)
-----   Message from Nori Riis, PA-C sent at 09/25/2017  3:22 PM EST ----- Patient's RUS demonstrates a renal cyst.  I still need an UA to check for hematuria.

## 2017-10-03 ENCOUNTER — Ambulatory Visit (INDEPENDENT_AMBULATORY_CARE_PROVIDER_SITE_OTHER): Payer: Medicare Other | Admitting: Internal Medicine

## 2017-10-03 VITALS — BP 128/64 | HR 58 | Temp 97.8°F | Resp 18 | Ht 68.0 in | Wt 198.0 lb

## 2017-10-03 DIAGNOSIS — R911 Solitary pulmonary nodule: Secondary | ICD-10-CM

## 2017-10-03 DIAGNOSIS — I10 Essential (primary) hypertension: Secondary | ICD-10-CM | POA: Diagnosis not present

## 2017-10-03 DIAGNOSIS — Z7901 Long term (current) use of anticoagulants: Secondary | ICD-10-CM

## 2017-10-03 DIAGNOSIS — Z1329 Encounter for screening for other suspected endocrine disorder: Secondary | ICD-10-CM

## 2017-10-03 DIAGNOSIS — Z1159 Encounter for screening for other viral diseases: Secondary | ICD-10-CM | POA: Diagnosis not present

## 2017-10-03 DIAGNOSIS — I2510 Atherosclerotic heart disease of native coronary artery without angina pectoris: Secondary | ICD-10-CM

## 2017-10-03 DIAGNOSIS — E118 Type 2 diabetes mellitus with unspecified complications: Secondary | ICD-10-CM | POA: Diagnosis not present

## 2017-10-03 DIAGNOSIS — L309 Dermatitis, unspecified: Secondary | ICD-10-CM | POA: Diagnosis not present

## 2017-10-03 DIAGNOSIS — Z23 Encounter for immunization: Secondary | ICD-10-CM

## 2017-10-03 DIAGNOSIS — Z1283 Encounter for screening for malignant neoplasm of skin: Secondary | ICD-10-CM | POA: Diagnosis not present

## 2017-10-03 DIAGNOSIS — Z794 Long term (current) use of insulin: Secondary | ICD-10-CM | POA: Diagnosis not present

## 2017-10-03 DIAGNOSIS — I701 Atherosclerosis of renal artery: Secondary | ICD-10-CM

## 2017-10-03 DIAGNOSIS — Z5181 Encounter for therapeutic drug level monitoring: Secondary | ICD-10-CM | POA: Diagnosis not present

## 2017-10-03 DIAGNOSIS — E559 Vitamin D deficiency, unspecified: Secondary | ICD-10-CM | POA: Diagnosis not present

## 2017-10-03 DIAGNOSIS — Z85828 Personal history of other malignant neoplasm of skin: Secondary | ICD-10-CM

## 2017-10-03 DIAGNOSIS — Z13818 Encounter for screening for other digestive system disorders: Secondary | ICD-10-CM

## 2017-10-03 DIAGNOSIS — Z125 Encounter for screening for malignant neoplasm of prostate: Secondary | ICD-10-CM

## 2017-10-03 DIAGNOSIS — E1142 Type 2 diabetes mellitus with diabetic polyneuropathy: Secondary | ICD-10-CM

## 2017-10-03 DIAGNOSIS — E785 Hyperlipidemia, unspecified: Secondary | ICD-10-CM

## 2017-10-03 DIAGNOSIS — M48061 Spinal stenosis, lumbar region without neurogenic claudication: Secondary | ICD-10-CM

## 2017-10-03 MED ORDER — NITROGLYCERIN 0.4 MG SL SUBL: 0.4000 mg | SUBLINGUAL_TABLET | SUBLINGUAL | 11 refills | Status: AC | PRN

## 2017-10-03 MED ORDER — HYDROCORTISONE 2.5 % EX CREA: TOPICAL_CREAM | Freq: Two times a day (BID) | CUTANEOUS | 0 refills | Status: DC

## 2017-10-03 MED ORDER — WARFARIN SODIUM 4 MG PO TABS: ORAL_TABLET | ORAL | 1 refills | Status: DC

## 2017-10-03 NOTE — Patient Instructions (Addendum)
Please talk to Dr. Gabriel Carina about your low blood sugars less than 70 Try hydrocortisone 2.5 2x per day to left ear  Will refer you to Dr. Clemon Chambers dermatology for skin check  [place]  [phone]   Follow up in 3-4 months sooner if needed  Monthly coumadin checks   Eczema Eczema is a broad term for a group of skin conditions that cause skin to become rough and inflamed. Each type of eczema has different triggers, symptoms, and treatments. Eczema of any type is usually itchy and symptoms range from mild to severe. Eczema and its symptoms are not spread from person to person (are not contagious). It can appear on different parts of the body at different times. Your eczema may not look the same as someone else's eczema. What are the types of eczema? Atopic dermatitis This is a long-term (chronic) skin disease that keeps coming back (recurring). Usual symptoms are dry skin and small, solid pimples that may swell and leak fluid (weep). Contact dermatitis This happens when something irritates the skin and causes a rash. The irritation can come from substances that you are allergic to (allergens), such as poison ivy, chemicals, or medicines that were applied to your skin. Dyshidrotic eczema This is a form of eczema on the hands and feet. It shows up as very itchy, fluid-filled blisters. It can affect people of any age, but is more common before age 28. Hand eczema This causes very itchy areas of skin on the palms and sides of the hands and fingers. This type of eczema is common in industrial jobs where you may be exposed to many different types of irritants. Lichen simplex chronicus This type of eczema occurs when a person constantly scratches one area of the body. Repeated scratching of the area leads to thickened skin (lichenification). Lichen simplex chronicus can occur along with other types of eczema. It is more common in adults, but may be seen in children as well. Nummular  eczema This is a common type of eczema. It has no known cause. It typically causes a red, circular, crusty lesion (plaque) that may be itchy. Scratching may become a habit and can cause bleeding. Nummular eczema occurs most often in people of middle-age or older. It most often affects the hands. Seborrheic dermatitis This is a common skin disease that mainly affects the scalp. It may also affect any oily areas of the body, such as the face, sides of nose, eyebrows, ears, eyelids, and chest. It is marked by small scaling and redness of the skin (erythema). This can affect people of all ages. In infants, this condition is known as [name]." Stasis dermatitis This is a common skin disease that usually appears on the legs and feet. It most often occurs in people who have a condition that prevents blood from being pumped through the veins in the legs (chronic venous insufficiency). Stasis dermatitis is a chronic condition that needs long-term management. How is eczema diagnosed? Your health care provider will examine your skin and review your medical history. He or she may also give you skin patch tests. These tests involve taking patches that contain possible allergens and placing them on your back. He or she will then check in a few days to see if an allergic reaction occurred. What are the common treatments? Treatment for eczema is based on the type of eczema you have. Hydrocortisone steroid medicine can relieve itching quickly and help reduce inflammation. This medicine may be prescribed or  obtained over-the-counter, depending on the strength of the medicine that is needed. Follow these instructions at home:  Take over-the-counter and prescription medicines only as told by your health care provider.  Use creams or ointments to moisturize your skin. Do not use lotions.  Learn what triggers or irritates your symptoms. Avoid these things.  Treat symptom flare-ups quickly.  Do not itch your skin.  This can make your rash worse.  Keep all follow-up visits as told by your health care provider. This is important. Where to find more information:  The American Academy of Dermatology: http://jones-macias.info/  The National Eczema Association: www.nationaleczema.org Contact a health care provider if:  You have serious itching, even with treatment.  You regularly scratch your skin until it bleeds.  Your rash looks different than usual.  Your skin is painful, swollen, or more red than usual.  You have a fever. Summary  There are eight general types of eczema. Each type has different triggers.  Eczema of any type causes itching that may range from mild to severe.  Treatment varies based on the type of eczema you have. Hydrocortisone steroid medicine can help with itching and inflammation.  Protecting your skin is the best way to prevent eczema. Use moisturizers and lotions. Avoid triggers and irritants, and treat flare-ups quickly. This information is not intended to replace advice given to you by your health care provider. Make sure you discuss any questions you have with your health care provider. Document Released: 01/13/2017 Document Revised: 01/13/2017 Document Reviewed: 01/13/2017 Elsevier Interactive Patient Education  2018 [employer].   Hypoglycemia Hypoglycemia is when the sugar (glucose) level in the blood is too low. Symptoms of low blood sugar may include:  Feeling: ? Hungry. ? Worried or nervous (anxious). ? Sweaty and clammy. ? Confused. ? Dizzy. ? Sleepy. ? Sick to your stomach (nauseous).  Having: ? A fast heartbeat. ? A headache. ? A change in your vision. ? Jerky movements that you cannot control (seizure). ? Nightmares. ? Tingling or no feeling (numbness) around the mouth, lips, or tongue.  Having trouble with: ? Talking. ? Paying attention (concentrating). ? Moving (coordination). ? Sleeping.  Shaking.  Passing out (fainting).  Getting upset  easily (irritability).  Low blood sugar can happen to people who have diabetes and people who do not have diabetes. Low blood sugar can happen quickly, and it can be an emergency. Treating Low Blood Sugar Low blood sugar is often treated by eating or drinking something sugary right away. If you can think clearly and swallow safely, follow the 15:15 rule:  Take 15 grams of a fast-acting carb (carbohydrate). Some fast-acting carbs are: ? 1 tube of glucose gel. ? 3 sugar tablets (glucose pills). ? 6-8 pieces of hard candy. ? 4 oz (120 mL) of fruit juice. ? 4 oz (120 mL) of regular (not diet) soda.  Check your blood sugar 15 minutes after you take the carb.  If your blood sugar is still at or below 70 mg/dL (3.9 mmol/L), take 15 grams of a carb again.  If your blood sugar does not go above 70 mg/dL (3.9 mmol/L) after 3 tries, get help right away.  After your blood sugar goes back to normal, eat a meal or a snack within 1 hour.  Treating Very Low Blood Sugar If your blood sugar is at or below 54 mg/dL (3 mmol/L), you have very low blood sugar (severe hypoglycemia). This is an emergency. Do not wait to see if the symptoms will  go away. Get medical help right away. Call your local emergency services (911 in the [place] Do not drive yourself to the hospital. If you have very low blood sugar and you cannot eat or drink, you may need a glucagon shot (injection). A family member or friend should learn how to check your blood sugar and how to give you a glucagon shot. Ask your doctor if you need to have a glucagon shot kit at home. Follow these instructions at home: General instructions  Avoid any diets that cause you to not eat enough food. Talk with your doctor before you start any new diet.  Take over-the-counter and prescription medicines only as told by your doctor.  Limit alcohol to no more than 1 drink per day for nonpregnant women and 2 drinks per day for men. One drink equals 12 oz of  beer, 5 oz of wine, or 1 oz of hard liquor.  Keep all follow-up visits as told by your doctor. This is important. If You Have Diabetes:   Make sure you know the symptoms of low blood sugar.  Always keep a source of sugar with you, such as: ? Sugar. ? Sugar tablets. ? Glucose gel. ? Fruit juice. ? Regular soda (not diet soda). ? Milk. ? Hard candy. ? Honey.  Take your medicines as told.  Follow your exercise and meal plan. ? Eat on time. Do not skip meals. ? Follow your sick day plan when you cannot eat or drink normally. Make this plan ahead of time with your doctor.  Check your blood sugar as often as told by your doctor. Always check before and after exercise.  Share your diabetes care plan with: ? Your work or school. ? People you live with.  Check your pee (urine) for ketones: ? When you are sick. ? As told by your doctor.  Carry a card or wear jewelry that says you have diabetes. If You Have Low Blood Sugar From Other Causes:   Check your blood sugar as often as told by your doctor.  Follow instructions from your doctor about what you cannot eat or drink. Contact a doctor if:  You have trouble keeping your blood sugar in your target range.  You have low blood sugar often. Get help right away if:  You still have symptoms after you eat or drink something sugary.  Your blood sugar is at or below 54 mg/dL (3 mmol/L).  You have jerky movements that you cannot control.  You pass out. These symptoms may be an emergency. Do not wait to see if the symptoms will go away. Get medical help right away. Call your local emergency services (911 in the [place] Do not drive yourself to the hospital. This information is not intended to replace advice given to you by your health care provider. Make sure you discuss any questions you have with your health care provider. Document Released: 11/24/2009 Document Revised: 02/05/2016 Document Reviewed: 10/03/2015 Elsevier  Interactive Patient Education  [employer].

## 2017-10-04 ENCOUNTER — Other Ambulatory Visit: Payer: Self-pay | Admitting: Internal Medicine

## 2017-10-04 ENCOUNTER — Other Ambulatory Visit: Payer: Self-pay | Admitting: Family Medicine

## 2017-10-04 DIAGNOSIS — G4739 Other sleep apnea: Secondary | ICD-10-CM | POA: Diagnosis not present

## 2017-10-04 DIAGNOSIS — E782 Mixed hyperlipidemia: Secondary | ICD-10-CM | POA: Diagnosis not present

## 2017-10-04 DIAGNOSIS — I2510 Atherosclerotic heart disease of native coronary artery without angina pectoris: Secondary | ICD-10-CM | POA: Diagnosis not present

## 2017-10-04 DIAGNOSIS — I10 Essential (primary) hypertension: Secondary | ICD-10-CM

## 2017-10-04 DIAGNOSIS — I480 Paroxysmal atrial fibrillation: Secondary | ICD-10-CM | POA: Diagnosis not present

## 2017-10-04 DIAGNOSIS — R609 Edema, unspecified: Secondary | ICD-10-CM

## 2017-10-04 DIAGNOSIS — I700 Atherosclerosis of aorta: Secondary | ICD-10-CM | POA: Diagnosis not present

## 2017-10-04 LAB — CBC WITH DIFFERENTIAL/PLATELET
Basophils Absolute: 41 cells/uL (ref 0–200)
Basophils Relative: 0.5 %
EOS ABS: 397 {cells}/uL (ref 15–500)
Eosinophils Relative: 4.9 %
HCT: 37.1 % — ABNORMAL LOW (ref 38.5–50.0)
Hemoglobin: 13.3 g/dL (ref 13.2–17.1)
Lymphs Abs: 1960 cells/uL (ref 850–3900)
MCH: 34.5 pg — AB (ref 27.0–33.0)
MCHC: 35.8 g/dL (ref 32.0–36.0)
MCV: 96.1 fL (ref 80.0–100.0)
MONOS PCT: 9.6 %
MPV: 10.6 fL (ref 7.5–12.5)
NEUTROS PCT: 60.8 %
Neutro Abs: 4925 cells/uL (ref 1500–7800)
PLATELETS: 235 10*3/uL (ref 140–400)
RBC: 3.86 10*6/uL — AB (ref 4.20–5.80)
RDW: 12.8 % (ref 11.0–15.0)
TOTAL LYMPHOCYTE: 24.2 %
WBC: 8.1 10*3/uL (ref 3.8–10.8)
WBCMIX: 778 {cells}/uL (ref 200–950)

## 2017-10-04 LAB — PSA: PSA: 0.5 ng/mL (ref <=4.0)

## 2017-10-04 LAB — HEPATITIS C ANTIBODY
Hepatitis C Ab: NONREACTIVE
SIGNAL TO CUT-OFF: 0.03 (ref <1.00)

## 2017-10-04 LAB — COMPREHENSIVE METABOLIC PANEL
AG Ratio: 1.2 (calc) (ref 1.0–2.5)
ALKALINE PHOSPHATASE (APISO): 77 U/L (ref 40–115)
ALT: 20 U/L (ref 9–46)
AST: 18 U/L (ref 10–35)
Albumin: 4.1 g/dL (ref 3.6–5.1)
BUN: 14 mg/dL (ref 7–25)
CO2: 24 mmol/L (ref 20–32)
CREATININE: 0.95 mg/dL (ref 0.70–1.18)
Calcium: 9.3 mg/dL (ref 8.6–10.3)
Chloride: 100 mmol/L (ref 98–110)
GLUCOSE: 103 mg/dL — AB (ref 65–99)
Globulin: 3.3 g/dL (calc) (ref 1.9–3.7)
Potassium: 4.2 mmol/L (ref 3.5–5.3)
Sodium: 138 mmol/L (ref 135–146)
Total Bilirubin: 0.5 mg/dL (ref 0.2–1.2)
Total Protein: 7.4 g/dL (ref 6.1–8.1)

## 2017-10-04 LAB — HEMOGLOBIN A1C
EAG (MMOL/L): 7.7 (calc)
HEMOGLOBIN A1C: 6.5 %{Hb} — AB (ref <5.7)
MEAN PLASMA GLUCOSE: 140 (calc)

## 2017-10-04 LAB — HEPATITIS B SURFACE ANTIBODY, QUANTITATIVE: Hepatitis B-Post: <5 m[IU]/mL — ABNORMAL LOW (ref >=10)

## 2017-10-04 LAB — VITAMIN D 25 HYDROXY (VIT D DEFICIENCY, FRACTURES): Vit D, 25-Hydroxy: 21 ng/mL — ABNORMAL LOW (ref 30–100)

## 2017-10-04 LAB — PROTIME-INR

## 2017-10-04 LAB — HEPATITIS B SURFACE ANTIGEN: Hepatitis B Surface Ag: NONREACTIVE

## 2017-10-04 LAB — TSH: TSH: 3.54 mIU/L (ref 0.40–4.50)

## 2017-10-04 MED ORDER — FUROSEMIDE 20 MG PO TABS: 20.0000 mg | ORAL_TABLET | Freq: Every day | ORAL | 1 refills | Status: DC

## 2017-10-04 NOTE — Telephone Encounter (Signed)
Pt was seen yesterday & needs refill on Lasix.

## 2017-10-05 ENCOUNTER — Other Ambulatory Visit: Payer: Self-pay

## 2017-10-05 NOTE — Telephone Encounter (Signed)
Last office visit 10/03/16 Next office visit  02/27/18

## 2017-10-06 ENCOUNTER — Other Ambulatory Visit: Payer: Self-pay | Admitting: Internal Medicine

## 2017-10-06 DIAGNOSIS — I2510 Atherosclerotic heart disease of native coronary artery without angina pectoris: Secondary | ICD-10-CM

## 2017-10-06 DIAGNOSIS — I10 Essential (primary) hypertension: Secondary | ICD-10-CM

## 2017-10-06 MED ORDER — CARVEDILOL 25 MG PO TABS: 25.0000 mg | ORAL_TABLET | Freq: Two times a day (BID) | ORAL | 1 refills | Status: DC

## 2017-10-07 ENCOUNTER — Other Ambulatory Visit: Payer: Self-pay | Admitting: Family Medicine

## 2017-10-07 ENCOUNTER — Encounter: Payer: Self-pay | Admitting: Internal Medicine

## 2017-10-07 ENCOUNTER — Other Ambulatory Visit: Payer: Medicare Other

## 2017-10-07 ENCOUNTER — Other Ambulatory Visit: Payer: Self-pay | Admitting: Internal Medicine

## 2017-10-07 ENCOUNTER — Encounter: Payer: Self-pay | Admitting: *Deleted

## 2017-10-07 DIAGNOSIS — E559 Vitamin D deficiency, unspecified: Secondary | ICD-10-CM

## 2017-10-07 DIAGNOSIS — M25559 Pain in unspecified hip: Secondary | ICD-10-CM | POA: Diagnosis not present

## 2017-10-07 DIAGNOSIS — Z85828 Personal history of other malignant neoplasm of skin: Secondary | ICD-10-CM | POA: Insufficient documentation

## 2017-10-07 DIAGNOSIS — R911 Solitary pulmonary nodule: Secondary | ICD-10-CM | POA: Insufficient documentation

## 2017-10-07 MED ORDER — CHOLECALCIFEROL 1.25 MG (50000 UT) PO TABS
1.0000 | ORAL_TABLET | ORAL | 1 refills | Status: DC
Start: 2017-10-07 — End: 2019-07-12

## 2017-10-07 NOTE — Progress Notes (Signed)
Chief Complaint  Patient presents with  . Follow-up   Establish care with wife today  1. DM 2 follows with Endocrine Dr. Gabriel Carina appt due 10/2017. He checks cbgs 3x per day and has had a low of 68. He gives himself Humalog 14-28 units 2x per day and Levemir 45 units in each leg daily., Metformin 1000 mg bid 2. He reports he is choking on K 55mq and and wants to take 2-141m due to size of pill being so large will check blood work 1st to see if he needs to take K 3. H/o CABG x 4 needs refill NTG SL 4. H/o AF on Coumadin 3 mg MWF and 4 mg T, Thurs, Sat Sunday gets monthly INR checks 5. He c/o irritation scale behind ears  6. HTN controlled on Norvasc 10, coreg 25 bid, lasix 20, hydralazine 100 mg tid, Imdur 120 EX 7. HLD uncontrolled pt stopped statin pravastatin 40 mg qhs    Review of Systems  Constitutional: Negative for weight loss.  HENT: Negative for hearing loss.   Eyes:       No vision problems today  Respiratory: Negative for shortness of breath.   Cardiovascular: Negative for chest pain.  Gastrointestinal: Negative for abdominal pain.  Musculoskeletal: Positive for back pain.  Skin: Positive for rash.  Neurological: Negative for headaches.  Psychiatric/Behavioral: Negative for memory loss.   Past Medical History:  Diagnosis Date  . Arthritis   . Atrial fibrillation ([facility]  . CAD (coronary artery disease)   . CHF (congestive heart failure) ([facility]  . Chicken pox   . Corneal dystrophy    bilateral  . Diabetes ([place]  . Dysrhythmia    A-FIB AND PAF  . GERD (gastroesophageal reflux disease)   . Gout   . History of shingles   . Hyperlipemia   . Hypertension   . Myocardial infarction (HCC)    x4  . Peripheral neuropathy   . Peripheral neuropathy   . Peripheral neuropathy   . PUD (peptic ulcer disease)   . Renal artery stenosis ([facility]  . Renal artery stenosis (HCK[place]  . Salzmann's nodular dystrophy 2012  . Spinal stenosis    Past Surgical History:  Procedure  Laterality Date  . APPLICATION VERTERBRAL DEFECT PROSTHETIC  05/01/2013  . ARTHRODESIS ANTERIOR LUMBAR SPINE  05/01/2013  . BACK SURGERY     lumbar fusion  . CARDIAC CATHETERIZATION    . CARDIAC ELECTROPHYSIOLOGY STUDY AND ABLATION    . CARDIAC SURGERY    . CARDIOVERSION    . CHOLECYSTECTOMY    . COLONOSCOPY WITH PROPOFOL N/A 04/09/2016   Procedure: COLONOSCOPY WITH PROPOFOL;  Surgeon: MaLollie SailsMD;  [facility];  Service: Endoscopy;  Laterality: N/A;  . CORNEAL EYE SURGERY Bilateral 07/28/11    09/22/2011  . CORONARY ANGIOPLASTY    . CORONARY ANGIOPLASTY WITH STENT PLACEMENT  03/28/2015   Distal 80% to normal Stent, Dilation Balloon  . CORONARY ARTERY BYPASS GRAFT    . EYE SURGERY     bilateral cataract  . foot and ankle repair Right 2005  . FRACTURE SURGERY     ANKLE PLATE AND SCREWS  . GALLBLADER    . JOINT REPLACEMENT    . KNEE ARTHROSCOPY Right   . LUMBAR SPINE FUSION ONE LEVEL  05/03/2013  . RENAL ARTERY STENT Right   . ROTATOR CUFF REPAIR Right 2006  . TONSILLECTOMY    . TOTAL HIP ARTHROPLASTY Left 11/11/2015   Procedure:  TOTAL HIP ARTHROPLASTY ANTERIOR APPROACH;  Surgeon: Hessie Knows, MD;  Location: [facility];  Service: Orthopedics;  Laterality: Left;  . TRANSCATH PLACEMENT INTRAVASCULAR STENT LEG  03/2015   Family History  Problem Relation Age of Onset  . CVA Father   . Stroke Father   . Lung cancer Mother   . Arthritis Mother   . Stomach cancer Sister   . Colon cancer Sister   . Diabetes Brother    Social History   Socioeconomic History  . Marital status: Married    Spouse name: Not on file  . Number of children: Not on file  . Years of education: Not on file  . Highest education level: Not on file  Social Needs  . Financial resource strain: Not on file  . Food insecurity - worry: Not on file  . Food insecurity - inability: Not on file  . Transportation needs - medical: Not on file  . Transportation needs - non-medical: Not on file   Occupational History  . Not on file  Tobacco Use  . Smoking status: Former Smoker    Packs/day: 1.00    Years: 3.00    Pack years: 3.00    Types: Cigarettes    Last attempt to quit: 06/30/1962    Years since quitting: 55.3  . Smokeless tobacco: Former Systems developer    Types: [place] date: 12/05/1968  Substance and Sexual Activity  . Alcohol use: No  . Drug use: No  . Sexual activity: Not on file  Other Topics Concern  . Not on file  Social History Narrative  . Not on file   Current Meds  Medication Sig  . amLODipine (NORVASC) 10 MG tablet TAKE 1 TABLET BY MOUTH ONCE DAILY  . azelastine (ASTELIN) 0.1 % nasal spray Place 2 sprays into both nostrils 2 (two) times daily. Use in each nostril as directed (Patient taking differently: Place 2 sprays into both nostrils daily. Use in each nostril as directed)  . clopidogrel (PLAVIX) 75 MG tablet Take 75 mg by mouth daily.  . colchicine 0.6 MG tablet TAKE 1 TABLET BY MOUTH ONCE DAILY  . diclofenac sodium (VOLTAREN) 1 % GEL Apply 2 g topically 4 (four) times daily as needed.   . dicyclomine (BENTYL) 10 MG capsule Take 10 mg by mouth 3 (three) times daily before meals.   . gabapentin (NEURONTIN) 300 MG capsule take 1 capsule by mouth three times a day (Patient taking differently: 600 mg TID)  . hydrALAZINE (APRESOLINE) 100 MG tablet take 1 tablet by mouth three times a day  . insulin lispro (HUMALOG) 100 UNIT/ML KiwkPen Take 15 - 25 units up to three times daily as directed  . isosorbide mononitrate (IMDUR) 120 MG 24 hr tablet TAKE 1 TABLET BY MOUTH ONCE DAILY  . LEVEMIR FLEXTOUCH 100 UNIT/ML Pen Inject 90 Units into the skin daily at 10 pm.   . metFORMIN (GLUCOPHAGE) 1000 MG tablet Take 1 tablet by mouth 2 (two) times daily.  . Multiple Vitamin (MULTI-VITAMINS) TABS Take 1 tablet by mouth daily.  Marland Kitchen omeprazole (PRILOSEC) 40 MG capsule Take 40 mg by mouth daily.  . potassium chloride SA (K-DUR,KLOR-CON) 20 MEQ tablet take 2 tablets by mouth  twice a day  . probenecid (BENEMID) 500 MG tablet Take 1 tablet by mouth 2 (two) times daily.  . vitamin C (ASCORBIC ACID) 500 MG tablet Take 1,000 mg by mouth daily.  . Vitamin D, Ergocalciferol, (DRISDOL) 50000 units CAPS capsule Take 1  capsule (50,000 Units total) by mouth every 7 (seven) days.  . Vitamin E 400 units TABS Take 400 Units by mouth 2 (two) times daily.  Marland Kitchen warfarin (COUMADIN) 3 MG tablet 1 tablet  Monday, Wednesday, Friday.  . warfarin (COUMADIN) 4 MG tablet 1 tablet Tuesday, Thursday, Saturday, and Sunday.  . [DISCONTINUED] carvedilol (COREG) 25 MG tablet Take 1 tablet (25 mg total) by mouth 2 (two) times daily. MUST KEEP UPCOMING APPT FOR REFILLS  . [DISCONTINUED] furosemide (LASIX) 20 MG tablet take 1 tablet by mouth once daily  . [DISCONTINUED] warfarin (COUMADIN) 4 MG tablet 1 tablet Tuesday, Thursday, Saturday, and Sunday.   Allergies  Allergen Reactions  . Allopurinol Diarrhea, Other (See Comments) and Nausea And Vomiting    Other Reaction: GI Upset  . Atenolol Other (See Comments)    Other Reaction: bradycardia  . Atorvastatin Other (See Comments)    Other Reaction: muscle aches  . Ramipril Rash  . Rosuvastatin Other (See Comments)    Muscle aches  . Simvastatin Other (See Comments)    Other Reaction: MUSCLE ACHES (Zocor)  . Valsartan Other (See Comments)    Other Reaction: facial swelling  . Cardizem [Diltiazem] Rash   Recent Results (from the past 2160 hour(s))  INR/PT     Status: Abnormal   Collection Time: 08/12/17  3:18 PM  Result Value Ref Range   INR 2.3 (H) 0.8 - 1.0 ratio   Prothrombin Time 24.4 (H) 9.6 - 13.1 sec  Urinalysis, Complete     Status: Abnormal   Collection Time: 08/17/17 10:52 AM  Result Value Ref Range   Specific Gravity, UA 1.025 1.005 - 1.030   pH, UA 5.5 5.0 - 7.5   Color, UA Yellow Yellow   Appearance Ur Clear Clear   Leukocytes, UA Negative Negative   Protein, UA Trace (A) Negative/Trace   Glucose, UA Negative Negative    Ketones, UA Negative Negative   RBC, UA Negative Negative   Bilirubin, UA Negative Negative   Urobilinogen, Ur 0.2 0.2 - 1.0 mg/dL   Nitrite, UA Negative Negative   Microscopic Examination See below:   Microscopic Examination     Status: Abnormal   Collection Time: 08/17/17 10:52 AM  Result Value Ref Range   WBC, UA None seen 0 - 5 /hpf   RBC, UA 11-30 (A) 0 - 2 /hpf   Epithelial Cells (non renal) 0-10 0 - 10 /hpf   Casts Present (A) None seen /lpf   Cast Type Hyaline casts N/A   Mucus, UA Present (A) Not Estab.   Bacteria, UA Few (A) None seen/Few  Bladder Scan (Post Void Residual) in office     Status: None   Collection Time: 08/17/17 11:20 AM  Result Value Ref Range   Scan Result 94 ml   CULTURE, URINE COMPREHENSIVE     Status: None   Collection Time: 08/17/17 12:13 PM  Result Value Ref Range   Urine Culture, Comprehensive Final report    Organism ID, Bacteria Comment     Comment: No growth in 36 - 48 hours.  Urinalysis, Complete     Status: None   Collection Time: 09/26/17  1:25 PM  Result Value Ref Range   Specific Gravity, UA 1.025 1.005 - 1.030   pH, UA 5.0 5.0 - 7.5   Color, UA Yellow Yellow   Appearance Ur Clear Clear   Leukocytes, UA Negative Negative   Protein, UA Negative Negative/Trace   Glucose, UA Negative Negative   Ketones,  UA Negative Negative   RBC, UA Negative Negative   Bilirubin, UA Negative Negative   Urobilinogen, Ur 0.2 0.2 - 1.0 mg/dL   Nitrite, UA Negative Negative  Hepatitis B surface antibody     Status: Abnormal   Collection Time: 10/03/17  4:56 PM  Result Value Ref Range   Hepatitis B-Post <5 (L) > OR = 10 mIU/mL    Comment: . Patient does not have immunity to hepatitis B virus. . For additional information, please refer to http://education.questdiagnostics.com/faq/FAQ105 (This link is being provided for informational/ educational purposes only).   Hepatitis B surface antigen     Status: None   Collection Time: 10/03/17  4:56 PM   Result Value Ref Range   Hepatitis B Surface Ag NON-REACTIVE NON-REACTI  Hepatitis C antibody     Status: None   Collection Time: 10/03/17  4:56 PM  Result Value Ref Range   Hepatitis C Ab NON-REACTIVE NON-REACTI   SIGNAL TO CUT-OFF 0.03 <1.00  CBC with Differential/Platelet     Status: Abnormal   Collection Time: 10/03/17  4:56 PM  Result Value Ref Range   WBC 8.1 3.8 - 10.8 Thousand/uL   RBC 3.86 (L) 4.20 - 5.80 Million/uL   Hemoglobin 13.3 13.2 - 17.1 g/dL   HCT 37.1 (L) 38.5 - 50.0 %   MCV 96.1 80.0 - 100.0 fL   MCH 34.5 (H) 27.0 - 33.0 pg   MCHC 35.8 32.0 - 36.0 g/dL   RDW 12.8 11.0 - 15.0 %   Platelets 235 140 - 400 Thousand/uL   MPV 10.6 7.5 - 12.5 fL   Neutro Abs 4,925 1,500 - 7,800 cells/uL   Lymphs Abs 1,960 850 - 3,900 cells/uL   WBC mixed population 778 200 - 950 cells/uL   Eosinophils Absolute 397 15 - 500 cells/uL   Basophils Absolute 41 0 - 200 cells/uL   Neutrophils Relative % 60.8 %   Total Lymphocyte 24.2 %   Monocytes Relative 9.6 %   Eosinophils Relative 4.9 %   Basophils Relative 0.5 %  Comprehensive metabolic panel     Status: Abnormal   Collection Time: 10/03/17  4:56 PM  Result Value Ref Range   Glucose, Bld 103 (H) 65 - 99 mg/dL    Comment: .            Fasting reference interval . For someone without known diabetes, a glucose value between 100 and 125 mg/dL is consistent with prediabetes and should be confirmed with a follow-up test. .    BUN 14 7 - 25 mg/dL   Creat 0.95 0.70 - 1.18 mg/dL    Comment: For patients >32 years of age, the reference limit for Creatinine is approximately 13% higher for people identified as African-American. .    BUN/Creatinine Ratio NOT APPLICABLE 6 - 22 (calc)   Sodium 138 135 - 146 mmol/L   Potassium 4.2 3.5 - 5.3 mmol/L   Chloride 100 98 - 110 mmol/L   CO2 24 20 - 32 mmol/L   Calcium 9.3 8.6 - 10.3 mg/dL   Total Protein 7.4 6.1 - 8.1 g/dL   Albumin 4.1 3.6 - 5.1 g/dL   Globulin 3.3 1.9 - 3.7 g/dL  (calc)   AG Ratio 1.2 1.0 - 2.5 (calc)   Total Bilirubin 0.5 0.2 - 1.2 mg/dL   Alkaline phosphatase (APISO) 77 40 - 115 U/L   AST 18 10 - 35 U/L   ALT 20 9 - 46 U/L  Hemoglobin A1C  Status: Abnormal   Collection Time: 10/03/17  4:56 PM  Result Value Ref Range   Hgb A1c MFr Bld 6.5 (H) <5.7 % of total Hgb    Comment: For someone without known diabetes, a hemoglobin A1c value of 6.5% or greater indicates that they may have  diabetes and this should be confirmed with a follow-up  test. . For someone with known diabetes, a value <7% indicates  that their diabetes is well controlled and a value  greater than or equal to 7% indicates suboptimal  control. A1c targets should be individualized based on  duration of diabetes, age, comorbid conditions, and  other considerations. . Currently, no consensus exists regarding use of hemoglobin A1c for diagnosis of diabetes for children. .    Mean Plasma Glucose 140 (calc)   eAG (mmol/L) 7.7 (calc)  INR/PT     Status: None   Collection Time: 10/03/17  4:56 PM  Result Value Ref Range   INR CANCELED     Comment:   Result canceled by the ancillary.   TSH     Status: None   Collection Time: 10/03/17  4:56 PM  Result Value Ref Range   TSH 3.54 0.40 - 4.50 mIU/L  VITAMIN D 25 Hydroxy (Vit-D Deficiency, Fractures)     Status: Abnormal   Collection Time: 10/03/17  4:56 PM  Result Value Ref Range   Vit D, 25-Hydroxy 21 (L) 30 - 100 ng/mL    Comment: Vitamin D Status         25-OH Vitamin D: . Deficiency:                    <20 ng/mL Insufficiency:             20 - 29 ng/mL Optimal:                 > or = 30 ng/mL . For 25-OH Vitamin D testing on patients on  D2-supplementation and patients for whom quantitation  of D2 and D3 fractions is required, the QuestAssureD(TM) 25-OH VIT D, (D2,D3), LC/MS/MS is recommended: order  code [id number] (patients >36yr). . For more information on this test, go  to: http://education.questdiagnostics.com/faq/FAQ163 (This link is being provided for  informational/educational purposes only.)   PSA     Status: None   Collection Time: 10/03/17  4:56 PM  Result Value Ref Range   PSA 0.5 < OR = 4.0 ng/mL    Comment: The total PSA value from this assay system is  standardized against the WHO standard. The test  result will be approximately 20% lower when compared  to the equimolar-standardized total PSA (Beckman  Coulter). Comparison of serial PSA results should be  interpreted with this fact in mind. . This test was performed using the Siemens  chemiluminescent method. Values obtained from  different assay methods cannot be used interchangeably. PSA levels, regardless of value, should not be interpreted as absolute evidence of the presence or absence of disease.    Objective  Body mass index is 30.11 kg/m. Wt Readings from Last 3 Encounters:  10/03/17 198 lb (89.8 kg)  09/26/17 193 lb (87.5 kg)  08/17/17 188 lb 12.8 oz (85.6 kg)   Temp Readings from Last 3 Encounters:  10/03/17 97.8 F (36.6 C) (Oral)  04/20/17 98 F (36.7 C) (Oral)  04/13/17 (!) 97.2 F (36.2 C) (Tympanic)   BP Readings from Last 3 Encounters:  10/03/17 128/64  09/26/17 (!) 156/66  08/17/17 133/60   Pulse  Readings from Last 3 Encounters:  10/03/17 (!) 58  09/26/17 69  08/17/17 68   O2 sat room air 95% Physical Exam  Constitutional: He is oriented to person, place, and time and well-developed, well-nourished, and in no distress.  HENT:  Head: Normocephalic and atraumatic.  Mouth/Throat: Oropharynx is clear and moist and mucous membranes are normal.  Scale behind b/l ears ? Eczema vs seb derm  Eyes: Conjunctivae are normal. Pupils are equal, round, and reactive to light.  Cardiovascular: Regular rhythm and normal heart sounds. Bradycardia present.  Pulmonary/Chest: Effort normal and breath sounds normal. He has no wheezes.  Abdominal: Soft. Bowel sounds  are normal. There is no tenderness.  Neurological: He is alert and oriented to person, place, and time. Gait normal. Gait normal.  Skin: Skin is warm and dry.  Psychiatric: Mood, memory, affect and judgment normal.  Nursing note and vitals reviewed.   Assessment   1. DM 2 with hypoglycemia x 1  2. HTN controlled/CAD s/p CABG x 4, Afib on Coumadin  Reviewed echo 03/21/02 nl RV systolic function, mild LV dysfunction EF 45% with mild LVH, valvular regurgitation, trivial MR, mild TR no valvular stenosis dilated pulmonary artery  3. HLD uncontrolled  4. seb derm vs eczema b/l ears 5. HM   Plan  1. On levemir 45 units x 2 gives to himself in both legs, Metformin 1000 mg bid, Humalog SSI gives himself 14-28 units 2x per day  Upcoming f/u with Endocrine Dr. Gabriel Carina 10/2017 will have him disc with her hypoglycemia advised likely needs to reduce dose  Check labs CMET, CBC, TSH, A1C, PSA, vitamin D,INR, urine for protein due 01/07/18, hep B/C Follows with Dr. Lyndel Safe in [facility] need to get eye exam records  Do foot exam at f/u  2.  Cont meds in future consider reduce coreg to 12.5 mg bid 2/2 bradycardia  Will encourage pt to take statin Check INR today  F/u with cardiology Dr. Roberto Scales   3. Encourage pt to take statin, check lipid fasting in future  4. Trial HC to ears.  5.  Had flu  pna 23 had 02/2014  Given prevnar today  Disc Tdap and shingrix in future   Check PSA consider DRE in future Colonoscopy had 04/2017 [facility] Consider repeat DEXA reviewed DEXA +osteoporosis noted 06/07/04  Consider repeat CT chest h/o pulm nodules on CXR and CT chest and former smoker  Refer to Derm Dr. Kellie Moor tbse c/w left temple and right hand Aks. H/o skin cancer   Of note reviewed meds pt note taking Flomax, Detrol, Toviaz 4 mg qd, Pravastatin  Will ask pt at f/u if has sleep apnea as had prior sleep study unable to view   Greater than 40 minutes spent with pt and chart review coordinating care   Provider:  Dr. Olivia Mackie McLean-Scocuzza-Internal Medicine

## 2017-10-07 NOTE — Telephone Encounter (Signed)
Last OV 10/03/17 last filled 07/12/17 90 0rf

## 2017-10-10 ENCOUNTER — Other Ambulatory Visit (INDEPENDENT_AMBULATORY_CARE_PROVIDER_SITE_OTHER): Payer: Medicare Other

## 2017-10-10 DIAGNOSIS — Z7901 Long term (current) use of anticoagulants: Secondary | ICD-10-CM | POA: Diagnosis not present

## 2017-10-10 DIAGNOSIS — Z5181 Encounter for therapeutic drug level monitoring: Secondary | ICD-10-CM

## 2017-10-10 LAB — PROTIME-INR
INR: 1.9 ratio — ABNORMAL HIGH (ref 0.8–1.0)
PROTHROMBIN TIME: 20.7 s — AB (ref 9.6–13.1)

## 2017-10-11 ENCOUNTER — Other Ambulatory Visit: Payer: Self-pay | Admitting: Internal Medicine

## 2017-10-11 NOTE — Progress Notes (Signed)
INR low  I want him to do 4 mg on all 5 days except Wednesday and Friday do 3 mg on those days,   Thanks [place]

## 2017-10-12 ENCOUNTER — Emergency Department
Admission: EM | Admit: 2017-10-12 | Discharge: 2017-10-13 | Disposition: A | Discharge status: Home / Self Care | Payer: Medicare Other | Attending: Emergency Medicine | Admitting: Emergency Medicine

## 2017-10-12 ENCOUNTER — Emergency Department: Payer: Medicare Other

## 2017-10-12 ENCOUNTER — Other Ambulatory Visit: Payer: Self-pay

## 2017-10-12 ENCOUNTER — Ambulatory Visit: Payer: Self-pay

## 2017-10-12 DIAGNOSIS — R531 Weakness: Secondary | ICD-10-CM

## 2017-10-12 DIAGNOSIS — Z951 Presence of aortocoronary bypass graft: Secondary | ICD-10-CM | POA: Insufficient documentation

## 2017-10-12 DIAGNOSIS — Z85828 Personal history of other malignant neoplasm of skin: Secondary | ICD-10-CM | POA: Insufficient documentation

## 2017-10-12 DIAGNOSIS — Z794 Long term (current) use of insulin: Secondary | ICD-10-CM | POA: Diagnosis not present

## 2017-10-12 DIAGNOSIS — M6281 Muscle weakness (generalized): Secondary | ICD-10-CM | POA: Diagnosis not present

## 2017-10-12 DIAGNOSIS — I2510 Atherosclerotic heart disease of native coronary artery without angina pectoris: Secondary | ICD-10-CM | POA: Insufficient documentation

## 2017-10-12 DIAGNOSIS — Z955 Presence of coronary angioplasty implant and graft: Secondary | ICD-10-CM | POA: Diagnosis not present

## 2017-10-12 DIAGNOSIS — Z7902 Long term (current) use of antithrombotics/antiplatelets: Secondary | ICD-10-CM | POA: Diagnosis not present

## 2017-10-12 DIAGNOSIS — I509 Heart failure, unspecified: Secondary | ICD-10-CM | POA: Insufficient documentation

## 2017-10-12 DIAGNOSIS — Z87891 Personal history of nicotine dependence: Secondary | ICD-10-CM | POA: Diagnosis not present

## 2017-10-12 DIAGNOSIS — I110 Hypertensive heart disease with heart failure: Secondary | ICD-10-CM | POA: Diagnosis not present

## 2017-10-12 DIAGNOSIS — E1140 Type 2 diabetes mellitus with diabetic neuropathy, unspecified: Secondary | ICD-10-CM | POA: Insufficient documentation

## 2017-10-12 DIAGNOSIS — N3000 Acute cystitis without hematuria: Secondary | ICD-10-CM | POA: Diagnosis not present

## 2017-10-12 DIAGNOSIS — R110 Nausea: Secondary | ICD-10-CM | POA: Diagnosis not present

## 2017-10-12 DIAGNOSIS — Z7901 Long term (current) use of anticoagulants: Secondary | ICD-10-CM | POA: Insufficient documentation

## 2017-10-12 DIAGNOSIS — Z96642 Presence of left artificial hip joint: Secondary | ICD-10-CM | POA: Insufficient documentation

## 2017-10-12 DIAGNOSIS — Z79899 Other long term (current) drug therapy: Secondary | ICD-10-CM | POA: Diagnosis not present

## 2017-10-12 DIAGNOSIS — R05 Cough: Secondary | ICD-10-CM | POA: Diagnosis not present

## 2017-10-12 DIAGNOSIS — R509 Fever, unspecified: Secondary | ICD-10-CM | POA: Diagnosis not present

## 2017-10-12 LAB — CBC
HEMATOCRIT: 42.2 % (ref 40.0–52.0)
HEMOGLOBIN: 14.7 g/dL (ref 13.0–18.0)
MCH: 34.5 pg — AB (ref 26.0–34.0)
MCHC: 34.8 g/dL (ref 32.0–36.0)
MCV: 99.2 fL (ref 80.0–100.0)
Platelets: 241 10*3/uL (ref 150–440)
RBC: 4.25 MIL/uL — ABNORMAL LOW (ref 4.40–5.90)
RDW: 14 % (ref 11.5–14.5)
WBC: 10.8 10*3/uL — ABNORMAL HIGH (ref 3.8–10.6)

## 2017-10-12 LAB — COMPREHENSIVE METABOLIC PANEL
ALK PHOS: 78 U/L (ref 38–126)
ALT: 26 U/L (ref 17–63)
ANION GAP: 14 (ref 5–15)
AST: 36 U/L (ref 15–41)
Albumin: 3.9 g/dL (ref 3.5–5.0)
BILIRUBIN TOTAL: 1.1 mg/dL (ref 0.3–1.2)
BUN: 24 mg/dL — ABNORMAL HIGH (ref 6–20)
CALCIUM: 8.7 mg/dL — AB (ref 8.9–10.3)
CO2: 21 mmol/L — ABNORMAL LOW (ref 22–32)
Chloride: 102 mmol/L (ref 101–111)
Creatinine, Ser: 1 mg/dL (ref 0.61–1.24)
Glucose, Bld: 192 mg/dL — ABNORMAL HIGH (ref 65–99)
Potassium: 4.1 mmol/L (ref 3.5–5.1)
Sodium: 137 mmol/L (ref 135–145)
TOTAL PROTEIN: 8 g/dL (ref 6.5–8.1)

## 2017-10-12 LAB — URINALYSIS, COMPLETE (UACMP) WITH MICROSCOPIC
BACTERIA UA: NONE SEEN
Bilirubin Urine: NEGATIVE
Glucose, UA: NEGATIVE mg/dL
Hgb urine dipstick: NEGATIVE
Ketones, ur: NEGATIVE mg/dL
NITRITE: NEGATIVE
PH: 5 (ref 5.0–8.0)
Protein, ur: NEGATIVE mg/dL
SPECIFIC GRAVITY, URINE: 1.021 (ref 1.005–1.030)

## 2017-10-12 LAB — INFLUENZA PANEL BY PCR (TYPE A & B)
INFLBPCR: NEGATIVE
Influenza A By PCR: NEGATIVE

## 2017-10-12 LAB — PROTIME-INR
INR: 1.8
PROTHROMBIN TIME: 20.7 s — AB (ref 11.4–15.2)

## 2017-10-12 LAB — TROPONIN I

## 2017-10-12 MED ORDER — SODIUM CHLORIDE 0.9 % IV BOLUS (SEPSIS)
1000.0000 mL | Freq: Once | INTRAVENOUS | Status: AC
  Administered 2017-10-12: 1000 mL via INTRAVENOUS

## 2017-10-12 MED ORDER — OXYCODONE HCL 5 MG PO TABS
5.0000 mg | ORAL_TABLET | Freq: Once | ORAL | Status: AC
  Administered 2017-10-12: 5 mg via ORAL
  Filled 2017-10-12: qty 1

## 2017-10-12 MED ORDER — DEXTROSE 5 % IV SOLN
1.0000 g | Freq: Once | INTRAVENOUS | Status: AC
  Administered 2017-10-12: 1 g via INTRAVENOUS
  Filled 2017-10-12: qty 10

## 2017-10-12 MED ORDER — ONDANSETRON HCL 4 MG/2ML IJ SOLN
4.0000 mg | Freq: Once | INTRAMUSCULAR | Status: AC
  Administered 2017-10-12: 4 mg via INTRAVENOUS
  Filled 2017-10-12: qty 2

## 2017-10-12 MED ORDER — CEPHALEXIN 500 MG PO CAPS: 500.0000 mg | ORAL_CAPSULE | Freq: Three times a day (TID) | ORAL | 0 refills | Status: AC

## 2017-10-12 MED ORDER — ACETAMINOPHEN 500 MG PO TABS
1000.0000 mg | ORAL_TABLET | Freq: Once | ORAL | Status: AC
  Administered 2017-10-12: 1000 mg via ORAL
  Filled 2017-10-12: qty 2

## 2017-10-12 NOTE — ED Notes (Signed)
First RN note:  Patient comes in via [facility] from home c/o generalized weakness and fevers at home.  VSS with EMS and CBG 203. H/o HTN, renal artery stents, cardiac stents, DM.

## 2017-10-12 NOTE — Telephone Encounter (Signed)
Patient's wife called in, I asked is the patient able to talk to me, she said "no, he's asleep and has been in the bed all day." I asked what is going on, she says "this morning, he only ate less then a handful of oatmeal, which is not like him. I didn't give him his metformin because he didn't eat that much. He got up out of the bed, went to the BR, took off his depends and came back to sit on the bed. He sat there and I asked what was he doing. He took the insulin pen and said it was his depends and he was going to put it on. She said she told him it wasn't. He's been confused all day, she says." I asked did she check a temp, she said "it has steady gone up all day from 98-100 now. His BP 147/61, P72 and erratic, his respirations are fast, his blood sugar is 210."  I asked is he unconscious, she said "no, he will arouse when you call his name, but goes right back to sleep." I advised her to call 911 to take him to the ED to be evaluated, I offered to call for her, she said "I can do it when I get dressed to go with him." I asked is she alone, she said "no, my grandson and granddaugher are here with me." She verbalized understanding of care advice to call 911.   Reason for Disposition . Mental status altered (confusion) now  Protocols used: 911 [facility]

## 2017-10-12 NOTE — ED Provider Notes (Signed)
[facility] Emergency Department Provider Note  ____________________________________________  Time seen: Approximately 10:30 PM  I have reviewed the triage vital signs and the nursing notes.   HISTORY  Chief Complaint Weakness   HPI Chase Cabrera is a 72 y.o. male with a history of A. Fib on coumadin, CAD, CHF, diabetes, hypertension, hyperlipidemia, peptic ulcer diseasewho presents for evaluation of generalized weakness.patient reports 2 days of generalized weakness, decreased appetite, and fatigue. Has been sleeping for most of the day. symptoms have been getting progressively worse and were more severe today. Symptoms have been constant.No fevers at home. He does have a cough productive of yellow sputum for the last week. He denies chest pain or shortness of breath. No dysuria or hematuria, no abdominal pain, no vomiting or diarrhea. This morning he felt nauseated.patient is complaining of right hip pain which is chronic, he has had it for 2 months, the pain is constant moderate sharp and radiates down to his knee, worse with movement, he is able to ambulate, has seen an orthopedic surgeon for this and he was told he has arthritis.  Past Medical History:  Diagnosis Date  . Arthritis   . Atrial fibrillation ([place])   . CAD (coronary artery disease)   . CHF (congestive heart failure) ([place])   . Chicken pox   . Corneal dystrophy    bilateral  . Diabetes ([place])   . Dysrhythmia    A-FIB AND PAF  . GERD (gastroesophageal reflux disease)   . Gout   . History of shingles   . Hyperlipemia   . Hypertension   . Kidney stones   . Myocardial infarction (HCC)    x4  . Peripheral neuropathy   . Peripheral neuropathy   . Peripheral neuropathy   . PUD (peptic ulcer disease)   . Renal artery stenosis ([place])   . Renal artery stenosis ([place])   . Salzmann's nodular dystrophy 2012  . Spinal stenosis     Patient Active Problem List   Diagnosis Date Noted  .  Pulmonary nodule 10/07/2017  . History of skin cancer 10/07/2017  . Rib pain on right side 03/07/2017  . Osteoarthritis 04/08/2016  . Spinal stenosis, lumbar 03/25/2016  . DM type 2 with diabetic peripheral neuropathy ([place]) 07/15/2014  . Gout 05/04/2013  . Long term current use of anticoagulant 07/29/2011  . Renal artery stenosis ([place]) 07/01/2011  . Atrial fibrillation ([place]) 06/30/2011  . CAD (coronary artery disease) 06/30/2011  . Hyperlipidemia 06/30/2011  . Essential hypertension 06/30/2011    Past Surgical History:  Procedure Laterality Date  . APPLICATION VERTERBRAL DEFECT PROSTHETIC  05/01/2013  . ARTHRODESIS ANTERIOR LUMBAR SPINE  05/01/2013  . BACK SURGERY     lumbar fusion  . CARDIAC CATHETERIZATION    . CARDIAC ELECTROPHYSIOLOGY STUDY AND ABLATION    . CARDIAC SURGERY    . CARDIOVERSION    . CHOLECYSTECTOMY    . COLONOSCOPY WITH PROPOFOL N/A 04/09/2016   Procedure: COLONOSCOPY WITH PROPOFOL;  Surgeon: Lollie Sails, MD;  Location: [facility] ENDOSCOPY;  Service: Endoscopy;  Laterality: N/A;  . CORNEAL EYE SURGERY Bilateral 07/28/11    09/22/2011  . CORONARY ANGIOPLASTY    . CORONARY ANGIOPLASTY WITH STENT PLACEMENT  03/28/2015   Distal 80% to normal Stent, Dilation Balloon  . CORONARY ARTERY BYPASS GRAFT    . EYE SURGERY     bilateral cataract  . foot and ankle repair Right 2005  . FRACTURE SURGERY     ANKLE PLATE  AND SCREWS  . GALLBLADER    . JOINT REPLACEMENT     left total hip 11/11/15  . KNEE ARTHROSCOPY Right   . LUMBAR SPINE FUSION ONE LEVEL  05/03/2013  . RENAL ARTERY STENT Right   . ROTATOR CUFF REPAIR Right 2006  . TONSILLECTOMY    . TOTAL HIP ARTHROPLASTY Left 11/11/2015   Procedure: TOTAL HIP ARTHROPLASTY ANTERIOR APPROACH;  Surgeon: Hessie Knows, MD;  Location: [facility] ORS;  Service: Orthopedics;  Laterality: Left;  . TRANSCATH PLACEMENT INTRAVASCULAR STENT LEG  03/2015    Prior to Admission medications   Medication Sig Start Date End Date Taking?  Authorizing Provider  amLODipine (NORVASC) 10 MG tablet TAKE 1 TABLET BY MOUTH ONCE DAILY 09/16/17   McLean-Scocuzza, Nino Glow, MD  azelastine (ASTELIN) 0.1 % nasal spray Place 2 sprays into both nostrils 2 (two) times daily. Use in each nostril as directed Patient taking differently: Place 2 sprays into both nostrils daily. Use in each nostril as directed 09/09/16   Coral Spikes, DO  carvedilol (COREG) 25 MG tablet Take 1 tablet (25 mg total) by mouth 2 (two) times daily with a meal. 10/06/17   McLean-Scocuzza, Nino Glow, MD  cephALEXin (KEFLEX) 500 MG capsule Take 1 capsule (500 mg total) by mouth 3 (three) times daily for 7 days. 10/12/17 10/19/17  Rudene Re, MD  Cholecalciferol 50000 units TABS Take 1 tablet by mouth once a week. 10/07/17   McLean-Scocuzza, Nino Glow, MD  clopidogrel (PLAVIX) 75 MG tablet Take 75 mg by mouth daily.    [provider]  colchicine 0.6 MG tablet TAKE 1 TABLET BY MOUTH ONCE DAILY 07/12/17   Leone Haven, MD  diclofenac sodium (VOLTAREN) 1 % GEL Apply 2 g topically 4 (four) times daily as needed.     [provider]  dicyclomine (BENTYL) 10 MG capsule Take 10 mg by mouth 3 (three) times daily before meals.     [provider]  fesoterodine (TOVIAZ) 4 MG TB24 tablet Take 1 tablet (4 mg total) daily by mouth. Patient not taking: Reported on 09/26/2017 07/21/17   Nickie Retort, MD  furosemide (LASIX) 20 MG tablet Take 1 tablet (20 mg total) by mouth daily. 10/04/17   McLean-Scocuzza, Nino Glow, MD  gabapentin (NEURONTIN) 300 MG capsule take 1 capsule by mouth three times a day Patient taking differently: 600 mg TID 04/19/16   Coral Spikes, DO  hydrALAZINE (APRESOLINE) 100 MG tablet take 1 tablet by mouth three times a day 12/27/16   Coral Spikes, DO  hydrocortisone 2.5 % cream Apply topically 2 (two) times daily. 10/03/17   McLean-Scocuzza, Nino Glow, MD  insulin lispro (HUMALOG) 100 UNIT/ML KiwkPen Take 15 - 25 units up to three times daily  as directed 05/03/16   [provider]  isosorbide mononitrate (IMDUR) 120 MG 24 hr tablet TAKE 1 TABLET BY MOUTH EVERY DAY 10/07/17   McLean-Scocuzza, Nino Glow, MD  LEVEMIR FLEXTOUCH 100 UNIT/ML Pen Inject 90 Units into the skin daily at 10 pm.  08/24/16   [provider]  metFORMIN (GLUCOPHAGE) 1000 MG tablet Take 1 tablet by mouth 2 (two) times daily. 01/01/15   [provider]  Multiple Vitamin (MULTI-VITAMINS) TABS Take 1 tablet by mouth daily. 04/15/09   [provider]  nitroGLYCERIN (NITROSTAT) 0.4 MG SL tablet Place 1 tablet (0.4 mg total) under the tongue every 5 (five) minutes x 3 doses as needed. For chest pain.  Call 911 if no relief  after 3 tablets 10/03/17 11/02/21  McLean-Scocuzza, Nino Glow, MD  omeprazole (PRILOSEC) 40 MG capsule Take 40 mg by mouth daily.    [provider]  pravastatin (PRAVACHOL) 40 MG tablet TAKE 1 TABLET BY MOUTH ONCE DAILY 10/07/17   McLean-Scocuzza, Nino Glow, MD  probenecid (BENEMID) 500 MG tablet Take 1 tablet by mouth 2 (two) times daily. 01/09/15   [provider]  tamsulosin (FLOMAX) 0.4 MG CAPS capsule take 1 capsule by mouth once daily Patient not taking: Reported on 10/03/2017 08/10/16   Coral Spikes, DO  tolterodine (DETROL) 2 MG tablet Take 1 tablet (2 mg total) by mouth 2 (two) times daily. Patient not taking: Reported on 08/17/2017 08/09/17   Nickie Retort, MD  vitamin C (ASCORBIC ACID) 500 MG tablet Take 1,000 mg by mouth daily.    [provider]  Vitamin D, Ergocalciferol, (DRISDOL) 50000 units CAPS capsule Take 1 capsule (50,000 Units total) by mouth every 7 (seven) days. 05/09/17   Lyndal Pulley, DO  Vitamin E 400 units TABS Take 400 Units by mouth 2 (two) times daily.    [provider]  warfarin (COUMADIN) 3 MG tablet 1 tablet  Monday, Wednesday, Friday. 11/16/16   Coral Spikes, DO  warfarin (COUMADIN) 4 MG tablet 1 tablet Tuesday, Thursday, Saturday, and Sunday. 10/03/17    McLean-Scocuzza, Nino Glow, MD    Allergies Allopurinol; Atenolol; Atorvastatin; Ramipril; Rosuvastatin; Simvastatin; Valsartan; and Cardizem [diltiazem]  Family History  Problem Relation Age of Onset  . CVA Father   . Stroke Father   . Lung cancer Mother   . Arthritis Mother   . Stomach cancer Sister   . Colon cancer Sister   . Diabetes Brother     Social History Social History   Tobacco Use  . Smoking status: Former Smoker    Packs/day: 1.00    Years: 3.00    Pack years: 3.00    Types: Cigarettes    Last attempt to quit: 06/30/1962    Years since quitting: 55.3  . Smokeless tobacco: Former Systems developer    Types: Chew    Quit date: 12/05/1968  Substance Use Topics  . Alcohol use: No  . Drug use: No    Review of Systems  Constitutional: Negative for fever. + generalized weakness Eyes: Negative for visual changes. ENT: Negative for sore throat. Neck: No neck pain  Cardiovascular: Negative for chest pain. Respiratory: Negative for shortness of breath. + productive cough Gastrointestinal: Negative for abdominal pain, vomiting or diarrhea. + nausea Genitourinary: Negative for dysuria. Musculoskeletal: Negative for back pain. + R hip pain Skin: Negative for rash. Neurological: Negative for headaches, weakness or numbness. Psych: No SI or HI  ____________________________________________   PHYSICAL EXAM:  VITAL SIGNS: ED Triage Vitals  Enc Vitals Group     BP 10/12/17 1910 (!) 142/60     Pulse Rate 10/12/17 1910 67     Resp 10/12/17 1910 20     Temp 10/12/17 1910 98 F (36.7 C)     Temp Source 10/12/17 1910 Oral     SpO2 10/12/17 1910 94 %     Weight 10/12/17 1906 198 lb (89.8 kg)     Height 10/12/17 1911 5' 8"  (1.727 m)     Head Circumference --      Peak Flow --      Pain Score 10/12/17 1911 0     Pain Loc --      Pain Edu? --  Excl. in [place]? --     Constitutional: Alert and oriented. Well appearing and in no apparent distress. HEENT:      Head:  Normocephalic and atraumatic.         Eyes: Conjunctivae are normal. Sclera is non-icteric.       Mouth/Throat: Mucous membranes are moist.       Neck: Supple with no signs of meningismus. Cardiovascular: Regular rate and rhythm. No murmurs, gallops, or rubs. 2+ symmetrical distal pulses are present in all extremities. No JVD. Respiratory: Normal respiratory effort. Lungs are clear to auscultation bilaterally. No wheezes, crackles, or rhonchi.  Gastrointestinal: Soft, non tender, and non distended with positive bowel sounds. No rebound or guarding. Genitourinary: No CVA tenderness. Musculoskeletal: full range of motion of the right hip with some tenderness to range of motion is preserved. No redness or warmth.Nontender with normal range of motion in all extremities. No edema, cyanosis, or erythema of extremities. Neurologic: Normal speech and language. Face is symmetric. Moving all extremities. No gross focal neurologic deficits are appreciated. Skin: Skin is warm, dry and intact. No rash noted. Psychiatric: Mood and affect are normal. Speech and behavior are normal.  ____________________________________________   LABS (all labs ordered are listed, but only abnormal results are displayed)  Labs Reviewed  CBC - Abnormal; Notable for the following components:      Result Value   WBC 10.8 (*)    RBC 4.25 (*)    MCH 34.5 (*)    All other components within normal limits  COMPREHENSIVE METABOLIC PANEL - Abnormal; Notable for the following components:   CO2 21 (*)    Glucose, Bld 192 (*)    BUN 24 (*)    Calcium 8.7 (*)    All other components within normal limits  URINALYSIS, COMPLETE (UACMP) WITH MICROSCOPIC - Abnormal; Notable for the following components:   Color, Urine YELLOW (*)    APPearance CLEAR (*)    Leukocytes, UA TRACE (*)    Squamous Epithelial / LPF 0-5 (*)    All other components within normal limits  PROTIME-INR - Abnormal; Notable for the following components:    Prothrombin Time 20.7 (*)    All other components within normal limits  URINE CULTURE  TROPONIN I  INFLUENZA PANEL BY PCR (TYPE A & B)   ____________________________________________  EKG  ED ECG REPORT I, Rudene Re, the attending physician, personally viewed and interpreted this ECG.  Normal sinus rhythm, rate of 68, normal intervals, left axis deviation, no ST elevations or depressions. Unchanged from prior ____________________________________________  RADIOLOGY  Interpreted by me: CXR: no pulmonary edema or pneumonia   Interpretation by Radiologist:  Dg Chest 2 View  Result Date: 10/12/2017 CLINICAL DATA:  Fever and weakness for 2 days EXAM: CHEST  2 VIEW COMPARISON:  05/09/2017 FINDINGS: Cardiac shadow is mildly enlarged. Postsurgical changes are again seen and stable. Calcified granulomas are noted in the right apex as well as the left lung base stable from the prior study. No focal infiltrate or sizable effusion is seen. No acute bony abnormality is noted. IMPRESSION: No active cardiopulmonary disease. Electronically Signed   By: Inez Catalina M.D.   On: 10/12/2017 20:10     ____________________________________________   PROCEDURES  Procedure(s) performed: None Procedures Critical Care performed:  None ____________________________________________   INITIAL IMPRESSION / ASSESSMENT AND PLAN / ED COURSE   72 y.o. male with a history of A. Fib on coumadin, CAD, CHF, diabetes, hypertension, hyperlipidemia, peptic ulcer diseasewho presents for  evaluation of generalized weakness, productive cough, decreased appetite x 2 days. patient looks slightly dry with dry mucous membranes, lungs are clear to auscultation, abdomen is soft and nontender, there is no evidence of infection on the hip where he has chronic pain. Labs showed slight leukocytosis with white count of 10.8, stable hemoglobin, CMP with stable creatinine, no evidence of DKA, flu swab negative. UA concerning for  possible mild UTI. Since patient has a white count how we'll give him IV Rocephin. Urine culture has been sent. We'll give IV fluids. Chest x-ray shows no pneumonia. EKG with no ischemic changes. Anticipate discharge.  Clinical Course as of Oct 12 2349  Wed Oct 12, 2017  2349 patient reports that he feels better after the fluids. He is going be discharged home on Keflex, recommended close follow-up with primary care doctor, discussed return precautions with patient and his wife. Patient is not stable for outpatient management.  [CV]    Clinical Course User Index [CV] Alfred Levins, [place], MD   _________________________ 11:52 PM on 10/12/2017 -----------------------------------------  Subtherapeutic INR. Patient is aware as his doctor had already checked yesterday and has already made the appropriate adjustments to increased the dose.   As part of my medical decision making, I reviewed the following data within the [place] History obtained from family, Nursing notes reviewed and incorporated, Labs reviewed , EKG interpreted , Old EKG reviewed, Radiograph reviewed , Notes from prior ED visits and [place] Controlled Substance Database    Pertinent labs & imaging results that were available during my care of the patient were reviewed by me and considered in my medical decision making (see chart for details).    ____________________________________________   FINAL CLINICAL IMPRESSION(S) / ED DIAGNOSES  Final diagnoses:  Generalized weakness  Acute cystitis without hematuria      NEW MEDICATIONS STARTED DURING THIS VISIT:  ED Discharge Orders        Ordered    cephALEXin (KEFLEX) 500 MG capsule  3 times daily     10/12/17 2350       Note:  This document was prepared using Dragon voice recognition software and may include unintentional dictation errors.    Rudene Re, MD 10/12/17 [id number]

## 2017-10-12 NOTE — ED Triage Notes (Signed)
Pt in with co generalized weakness for 2 days states unsure of fever. Denies any cold symptoms, no n,v,d or dysuria. Pt co chronic right knee and right hip pain denies any recent injury.

## 2017-10-13 NOTE — Telephone Encounter (Signed)
FYI

## 2017-10-13 NOTE — Telephone Encounter (Signed)
Patient was seen in ED 10/12/17

## 2017-10-13 NOTE — Telephone Encounter (Signed)
Please sch ED f/u with me   Thanks TSM

## 2017-10-13 NOTE — Telephone Encounter (Signed)
ED follow up scheduled for 10/18/17,

## 2017-10-14 LAB — URINE CULTURE

## 2017-10-17 DIAGNOSIS — Z794 Long term (current) use of insulin: Secondary | ICD-10-CM | POA: Diagnosis not present

## 2017-10-17 DIAGNOSIS — E1142 Type 2 diabetes mellitus with diabetic polyneuropathy: Secondary | ICD-10-CM | POA: Diagnosis not present

## 2017-10-17 DIAGNOSIS — E1122 Type 2 diabetes mellitus with diabetic chronic kidney disease: Secondary | ICD-10-CM | POA: Diagnosis not present

## 2017-10-17 DIAGNOSIS — E1129 Type 2 diabetes mellitus with other diabetic kidney complication: Secondary | ICD-10-CM | POA: Diagnosis not present

## 2017-10-17 DIAGNOSIS — R809 Proteinuria, unspecified: Secondary | ICD-10-CM | POA: Diagnosis not present

## 2017-10-17 DIAGNOSIS — N182 Chronic kidney disease, stage 2 (mild): Secondary | ICD-10-CM | POA: Diagnosis not present

## 2017-10-17 DIAGNOSIS — E1159 Type 2 diabetes mellitus with other circulatory complications: Secondary | ICD-10-CM | POA: Diagnosis not present

## 2017-10-18 ENCOUNTER — Ambulatory Visit (INDEPENDENT_AMBULATORY_CARE_PROVIDER_SITE_OTHER): Payer: Medicare Other | Admitting: Internal Medicine

## 2017-10-18 ENCOUNTER — Encounter: Payer: Self-pay | Admitting: Internal Medicine

## 2017-10-18 VITALS — BP 114/58 | HR 59 | Temp 98.1°F | Ht 68.0 in | Wt 194.6 lb

## 2017-10-18 DIAGNOSIS — Z7901 Long term (current) use of anticoagulants: Secondary | ICD-10-CM

## 2017-10-18 DIAGNOSIS — M1990 Unspecified osteoarthritis, unspecified site: Secondary | ICD-10-CM | POA: Diagnosis not present

## 2017-10-18 DIAGNOSIS — K5090 Crohn's disease, unspecified, without complications: Secondary | ICD-10-CM

## 2017-10-18 DIAGNOSIS — R58 Hemorrhage, not elsewhere classified: Secondary | ICD-10-CM

## 2017-10-18 DIAGNOSIS — I4891 Unspecified atrial fibrillation: Secondary | ICD-10-CM

## 2017-10-18 DIAGNOSIS — Z9229 Personal history of other drug therapy: Secondary | ICD-10-CM | POA: Diagnosis not present

## 2017-10-18 LAB — PROTIME-INR
INR: 1.9 ratio — ABNORMAL HIGH (ref 0.8–1.0)
Prothrombin Time: 20.9 s — ABNORMAL HIGH (ref 9.6–13.1)

## 2017-10-18 NOTE — Patient Instructions (Addendum)
Follow up in 2 weeks for inr F/u with me as scheduled.   Hip Pain The hip is the joint between the upper legs and the lower pelvis. The bones, cartilage, tendons, and muscles of your hip joint support your body and allow you to move around. Hip pain can range from a minor ache to severe pain in one or both of your hips. The pain may be felt on the inside of the hip joint near the groin, or the outside near the buttocks and upper thigh. You may also have swelling or stiffness. Follow these instructions at home: Managing pain, stiffness, and swelling  If directed, apply ice to the injured area. ? Put ice in a plastic bag. ? Place a towel between your skin and the bag. ? Leave the ice on for 20 minutes, 2-3 times a day  Sleep with a pillow between your legs on your most comfortable side.  Avoid any activities that cause pain. General instructions  Take over-the-counter and prescription medicines only as told by your health care provider.  Do any exercises as told by your health care provider.  Record the following: ? How often you have hip pain. ? The location of your pain. ? What the pain feels like. ? What makes the pain worse.  Keep all follow-up visits as told by your health care provider. This is important. Contact a health care provider if:  You cannot put weight on your leg.  Your pain or swelling continues or gets worse after one week.  It gets harder to walk.  You have a fever. Get help right away if:  You fall.  You have a sudden increase in pain and swelling in your hip.  Your hip is red or swollen or very tender to touch. Summary  Hip pain can range from a minor ache to severe pain in one or both of your hips.  The pain may be felt on the inside of the hip joint near the groin, or the outside near the buttocks and upper thigh.  Avoid any activities that cause pain.  Record how often you have hip pain, the location of the pain, what makes it worse and  what it feels like. This information is not intended to replace advice given to you by your health care provider. Make sure you discuss any questions you have with your health care provider. Document Released: 02/17/2010 Document Revised: 08/02/2016 Document Reviewed: 08/02/2016 Elsevier Interactive Patient Education  [employer].

## 2017-10-18 NOTE — Progress Notes (Signed)
Pre visit review using our clinic review tool, if applicable. No additional management support is needed unless otherwise documented below in the visit note. 

## 2017-10-19 DIAGNOSIS — M1611 Unilateral primary osteoarthritis, right hip: Secondary | ICD-10-CM | POA: Diagnosis not present

## 2017-10-20 ENCOUNTER — Encounter: Payer: Self-pay | Admitting: Internal Medicine

## 2017-10-20 DIAGNOSIS — K5090 Crohn's disease, unspecified, without complications: Secondary | ICD-10-CM | POA: Insufficient documentation

## 2017-10-20 NOTE — Progress Notes (Addendum)
Chief Complaint  Patient presents with  . Follow-up   Follow up ED he went for fatigue, generalized weakness and reduce appetite 10/12/17 x 2 days. Urine was checked and he was tx'ed with keflex 500 mg tid and will finish tomorrow. He reports feeling better since being on keflex urine culture grew multiple species. He reports he passed a kidney stone recently with a h/o kidney stones but his wife lost the kidney stone.  He was given Rocephin in the ED. CXR negative  Right hip is bothering him today and he is walking with a walker. Tomorrow he has injection to hip with Dr. Rudene Christians. He also c/o right knee pain both chronic.   H/o Afib on Coumadin subtherapeutic INR pt reports he cant get steroid injection tomorrow if INR is elevated. Advised pt INR needs to be more at goal 2-3 will give further instructions on Friday and given list of vit K foods which and effect INR.  He feels like his blood was thinner today when he checked his blood sugar    Review of Systems  Constitutional: Negative for weight loss.  HENT: Negative for hearing loss.   Eyes: Negative for blurred vision.  Respiratory: Negative for cough.   Cardiovascular: Negative for chest pain.  Gastrointestinal:       +crohns stable for now   Genitourinary: Negative for dysuria.  Musculoskeletal: Positive for joint pain.  Skin: Negative for rash.  Neurological: Negative for headaches.  Psychiatric/Behavioral: Negative for memory loss.   Past Medical History:  Diagnosis Date  . Arthritis   . Atrial fibrillation ([place])   . CAD (coronary artery disease)   . CHF (congestive heart failure) ([place])   . Chicken pox   . Corneal dystrophy    bilateral  . Diabetes ([place])   . Dysrhythmia    A-FIB AND PAF  . GERD (gastroesophageal reflux disease)   . Gout   . History of shingles   . Hyperlipemia   . Hypertension   . Kidney stones   . Myocardial infarction ([facility])    x4  . Peripheral neuropathy   . Peripheral neuropathy   . Peripheral  neuropathy   . PUD (peptic ulcer disease)   . Renal artery stenosis ([place])   . Renal artery stenosis ([place])   . Salzmann's nodular dystrophy 2012  . Spinal stenosis    Past Surgical History:  Procedure Laterality Date  . APPLICATION VERTERBRAL DEFECT PROSTHETIC  05/01/2013  . ARTHRODESIS ANTERIOR LUMBAR SPINE  05/01/2013  . BACK SURGERY     lumbar fusion  . CARDIAC CATHETERIZATION    . CARDIAC ELECTROPHYSIOLOGY STUDY AND ABLATION    . CARDIAC SURGERY    . CARDIOVERSION    . CHOLECYSTECTOMY    . COLONOSCOPY WITH PROPOFOL N/A 04/09/2016   Procedure: COLONOSCOPY WITH PROPOFOL;  Surgeon: Lollie Sails, MD;  Location: [facility] ENDOSCOPY;  Service: Endoscopy;  Laterality: N/A;  . CORNEAL EYE SURGERY Bilateral 07/28/11    09/22/2011  . CORONARY ANGIOPLASTY    . CORONARY ANGIOPLASTY WITH STENT PLACEMENT  03/28/2015   Distal 80% to normal Stent, Dilation Balloon  . CORONARY ARTERY BYPASS GRAFT    . EYE SURGERY     bilateral cataract  . foot and ankle repair Right 2005  . FRACTURE SURGERY     ANKLE PLATE AND SCREWS  . GALLBLADER    . JOINT REPLACEMENT     left total hip 11/11/15  . KNEE ARTHROSCOPY Right   . LUMBAR SPINE FUSION ONE  LEVEL  05/03/2013  . RENAL ARTERY STENT Right   . ROTATOR CUFF REPAIR Right 2006  . TONSILLECTOMY    . TOTAL HIP ARTHROPLASTY Left 11/11/2015   Procedure: TOTAL HIP ARTHROPLASTY ANTERIOR APPROACH;  Surgeon: Hessie Knows, MD;  Location: [facility] ORS;  Service: Orthopedics;  Laterality: Left;  . TRANSCATH PLACEMENT INTRAVASCULAR STENT LEG  03/2015   Family History  Problem Relation Age of Onset  . CVA Father   . Stroke Father   . Lung cancer Mother   . Arthritis Mother   . Stomach cancer Sister   . Colon cancer Sister   . Diabetes Brother    Social History   Socioeconomic History  . Marital status: Married    Spouse name: Not on file  . Number of children: Not on file  . Years of education: Not on file  . Highest education level: Not on file  Social  Needs  . Financial resource strain: Not on file  . Food insecurity - worry: Not on file  . Food insecurity - inability: Not on file  . Transportation needs - medical: Not on file  . Transportation needs - non-medical: Not on file  Occupational History  . Not on file  Tobacco Use  . Smoking status: Former Smoker    Packs/day: 1.00    Years: 3.00    Pack years: 3.00    Types: Cigarettes    Last attempt to quit: 06/30/1962    Years since quitting: 55.3  . Smokeless tobacco: Former Systems developer    Types: [place] date: 12/05/1968  Substance and Sexual Activity  . Alcohol use: No  . Drug use: No  . Sexual activity: Not on file  Other Topics Concern  . Not on file  Social History Narrative  . Not on file   Current Meds  Medication Sig  . amLODipine (NORVASC) 10 MG tablet TAKE 1 TABLET BY MOUTH ONCE DAILY  . azelastine (ASTELIN) 0.1 % nasal spray Place 2 sprays into both nostrils 2 (two) times daily. Use in each nostril as directed (Patient taking differently: Place 2 sprays into both nostrils daily. Use in each nostril as directed)  . carvedilol (COREG) 25 MG tablet Take 1 tablet (25 mg total) by mouth 2 (two) times daily with a meal.  . [EXPIRED] cephALEXin (KEFLEX) 500 MG capsule Take 1 capsule (500 mg total) by mouth 3 (three) times daily for 7 days.  . Cholecalciferol 50000 units TABS Take 1 tablet by mouth once a week.  . clopidogrel (PLAVIX) 75 MG tablet Take 75 mg by mouth daily.  . colchicine 0.6 MG tablet TAKE 1 TABLET BY MOUTH ONCE DAILY  . diclofenac sodium (VOLTAREN) 1 % GEL Apply 2 g topically 4 (four) times daily as needed.   . dicyclomine (BENTYL) 10 MG capsule Take 10 mg by mouth 3 (three) times daily before meals.   . fesoterodine (TOVIAZ) 4 MG TB24 tablet Take 1 tablet (4 mg total) daily by mouth.  . furosemide (LASIX) 20 MG tablet Take 1 tablet (20 mg total) by mouth daily.  Marland Kitchen gabapentin (NEURONTIN) 300 MG capsule take 1 capsule by mouth three times a day (Patient  taking differently: 600 mg TID)  . hydrALAZINE (APRESOLINE) 100 MG tablet take 1 tablet by mouth three times a day  . hydrocortisone 2.5 % cream Apply topically 2 (two) times daily.  . insulin lispro (HUMALOG) 100 UNIT/ML KiwkPen Take 15 - 25 units up to three times daily as directed  .  isosorbide mononitrate (IMDUR) 120 MG 24 hr tablet TAKE 1 TABLET BY MOUTH EVERY DAY  . LEVEMIR FLEXTOUCH 100 UNIT/ML Pen Inject 90 Units into the skin daily at 10 pm.   . mesalamine (LIALDA) 1.2 g EC tablet Take 2.4 g by mouth daily with breakfast. [facility]  . metFORMIN (GLUCOPHAGE) 1000 MG tablet Take 1 tablet by mouth 2 (two) times daily.  . Multiple Vitamin (MULTI-VITAMINS) TABS Take 1 tablet by mouth daily.  . nitroGLYCERIN (NITROSTAT) 0.4 MG SL tablet Place 1 tablet (0.4 mg total) under the tongue every 5 (five) minutes x 3 doses as needed. For chest pain.  Call 911 if no relief after 3 tablets  . omeprazole (PRILOSEC) 40 MG capsule Take 40 mg by mouth daily.  . pravastatin (PRAVACHOL) 40 MG tablet TAKE 1 TABLET BY MOUTH ONCE DAILY  . probenecid (BENEMID) 500 MG tablet Take 1 tablet by mouth 2 (two) times daily.  . tamsulosin (FLOMAX) 0.4 MG CAPS capsule take 1 capsule by mouth once daily  . tolterodine (DETROL) 2 MG tablet Take 1 tablet (2 mg total) by mouth 2 (two) times daily.  . vitamin C (ASCORBIC ACID) 500 MG tablet Take 1,000 mg by mouth daily.  . Vitamin D, Ergocalciferol, (DRISDOL) 50000 units CAPS capsule Take 1 capsule (50,000 Units total) by mouth every 7 (seven) days.  . Vitamin E 400 units TABS Take 400 Units by mouth 2 (two) times daily.  Marland Kitchen warfarin (COUMADIN) 3 MG tablet 1 tablet  Monday, Wednesday, Friday. (Patient taking differently: 1 tablet  Monday, Wednesday)  . warfarin (COUMADIN) 4 MG tablet 1 tablet Tuesday, Thursday, Saturday, and Sunday. (Patient taking differently: Take 4 mg by mouth one time only at 6 PM. 1 tablet Tuesday, Thursday, Friday, Saturday, and Sunday.)   Allergies   Allergen Reactions  . Allopurinol Diarrhea, Other (See Comments) and Nausea And Vomiting    Other Reaction: GI Upset  . Atenolol Other (See Comments)    Other Reaction: bradycardia  . Atorvastatin Other (See Comments)    Other Reaction: muscle aches  . Ramipril Rash  . Rosuvastatin Other (See Comments)    Muscle aches  . Simvastatin Other (See Comments)    Other Reaction: MUSCLE ACHES (Zocor)  . Valsartan Other (See Comments)    Other Reaction: facial swelling  . Cardizem [Diltiazem] Rash   Recent Results (from the past 2160 hour(s))  INR/PT     Status: Abnormal   Collection Time: 08/12/17  3:18 PM  Result Value Ref Range   INR 2.3 (H) 0.8 - 1.0 ratio   Prothrombin Time 24.4 (H) 9.6 - 13.1 sec  Urinalysis, Complete     Status: Abnormal   Collection Time: 08/17/17 10:52 AM  Result Value Ref Range   Specific Gravity, UA 1.025 1.005 - 1.030   pH, UA 5.5 5.0 - 7.5   Color, UA Yellow Yellow   Appearance Ur Clear Clear   Leukocytes, UA Negative Negative   Protein, UA Trace (A) Negative/Trace   Glucose, UA Negative Negative   Ketones, UA Negative Negative   RBC, UA Negative Negative   Bilirubin, UA Negative Negative   Urobilinogen, Ur 0.2 0.2 - 1.0 mg/dL   Nitrite, UA Negative Negative   Microscopic Examination See below:   Microscopic Examination     Status: Abnormal   Collection Time: 08/17/17 10:52 AM  Result Value Ref Range   WBC, UA None seen 0 - 5 /hpf   RBC, UA 11-30 (A) 0 - 2 /hpf  Epithelial Cells (non renal) 0-10 0 - 10 /hpf   Casts Present (A) None seen /lpf   Cast Type Hyaline casts N/A   Mucus, UA Present (A) Not Estab.   Bacteria, UA Few (A) None seen/Few  Bladder Scan (Post Void Residual) in office     Status: None   Collection Time: 08/17/17 11:20 AM  Result Value Ref Range   Scan Result 94 ml   CULTURE, URINE COMPREHENSIVE     Status: None   Collection Time: 08/17/17 12:13 PM  Result Value Ref Range   Urine Culture, Comprehensive Final report     Organism ID, Bacteria Comment     Comment: No growth in 36 - 48 hours.  Urinalysis, Complete     Status: None   Collection Time: 09/26/17  1:25 PM  Result Value Ref Range   Specific Gravity, UA 1.025 1.005 - 1.030   pH, UA 5.0 5.0 - 7.5   Color, UA Yellow Yellow   Appearance Ur Clear Clear   Leukocytes, UA Negative Negative   Protein, UA Negative Negative/Trace   Glucose, UA Negative Negative   Ketones, UA Negative Negative   RBC, UA Negative Negative   Bilirubin, UA Negative Negative   Urobilinogen, Ur 0.2 0.2 - 1.0 mg/dL   Nitrite, UA Negative Negative  Hepatitis B surface antibody     Status: Abnormal   Collection Time: 10/03/17  4:56 PM  Result Value Ref Range   Hepatitis B-Post <5 (L) > OR = 10 mIU/mL    Comment: . Patient does not have immunity to hepatitis B virus. . For additional information, please refer to http://education.questdiagnostics.com/faq/FAQ105 (This link is being provided for informational/ educational purposes only).   Hepatitis B surface antigen     Status: None   Collection Time: 10/03/17  4:56 PM  Result Value Ref Range   Hepatitis B Surface Ag NON-REACTIVE NON-REACTI  Hepatitis C antibody     Status: None   Collection Time: 10/03/17  4:56 PM  Result Value Ref Range   Hepatitis C Ab NON-REACTIVE NON-REACTI   SIGNAL TO CUT-OFF 0.03 <1.00  CBC with Differential/Platelet     Status: Abnormal   Collection Time: 10/03/17  4:56 PM  Result Value Ref Range   WBC 8.1 3.8 - 10.8 Thousand/uL   RBC 3.86 (L) 4.20 - 5.80 Million/uL   Hemoglobin 13.3 13.2 - 17.1 g/dL   HCT 37.1 (L) 38.5 - 50.0 %   MCV 96.1 80.0 - 100.0 fL   MCH 34.5 (H) 27.0 - 33.0 pg   MCHC 35.8 32.0 - 36.0 g/dL   RDW 12.8 11.0 - 15.0 %   Platelets 235 140 - 400 Thousand/uL   MPV 10.6 7.5 - 12.5 fL   Neutro Abs 4,925 1,500 - 7,800 cells/uL   Lymphs Abs 1,960 850 - 3,900 cells/uL   WBC mixed population 778 200 - 950 cells/uL   Eosinophils Absolute 397 15 - 500 cells/uL   Basophils  Absolute 41 0 - 200 cells/uL   Neutrophils Relative % 60.8 %   Total Lymphocyte 24.2 %   Monocytes Relative 9.6 %   Eosinophils Relative 4.9 %   Basophils Relative 0.5 %  Comprehensive metabolic panel     Status: Abnormal   Collection Time: 10/03/17  4:56 PM  Result Value Ref Range   Glucose, Bld 103 (H) 65 - 99 mg/dL    Comment: .            Fasting reference interval . For someone  without known diabetes, a glucose value between 100 and 125 mg/dL is consistent with prediabetes and should be confirmed with a follow-up test. .    BUN 14 7 - 25 mg/dL   Creat 0.95 0.70 - 1.18 mg/dL    Comment: For patients >33 years of age, the reference limit for Creatinine is approximately 13% higher for people identified as African-American. .    BUN/Creatinine Ratio NOT APPLICABLE 6 - 22 (calc)   Sodium 138 135 - 146 mmol/L   Potassium 4.2 3.5 - 5.3 mmol/L   Chloride 100 98 - 110 mmol/L   CO2 24 20 - 32 mmol/L   Calcium 9.3 8.6 - 10.3 mg/dL   Total Protein 7.4 6.1 - 8.1 g/dL   Albumin 4.1 3.6 - 5.1 g/dL   Globulin 3.3 1.9 - 3.7 g/dL (calc)   AG Ratio 1.2 1.0 - 2.5 (calc)   Total Bilirubin 0.5 0.2 - 1.2 mg/dL   Alkaline phosphatase (APISO) 77 40 - 115 U/L   AST 18 10 - 35 U/L   ALT 20 9 - 46 U/L  Hemoglobin A1C     Status: Abnormal   Collection Time: 10/03/17  4:56 PM  Result Value Ref Range   Hgb A1c MFr Bld 6.5 (H) <5.7 % of total Hgb    Comment: For someone without known diabetes, a hemoglobin A1c value of 6.5% or greater indicates that they may have  diabetes and this should be confirmed with a follow-up  test. . For someone with known diabetes, a value <7% indicates  that their diabetes is well controlled and a value  greater than or equal to 7% indicates suboptimal  control. A1c targets should be individualized based on  duration of diabetes, age, comorbid conditions, and  other considerations. . Currently, no consensus exists regarding use of hemoglobin A1c for  diagnosis of diabetes for children. .    Mean Plasma Glucose 140 (calc)   eAG (mmol/L) 7.7 (calc)  INR/PT     Status: None   Collection Time: 10/03/17  4:56 PM  Result Value Ref Range   INR      Comment:   TSH     Status: None   Collection Time: 10/03/17  4:56 PM  Result Value Ref Range   TSH 3.54 0.40 - 4.50 mIU/L  VITAMIN D 25 Hydroxy (Vit-D Deficiency, Fractures)     Status: Abnormal   Collection Time: 10/03/17  4:56 PM  Result Value Ref Range   Vit D, 25-Hydroxy 21 (L) 30 - 100 ng/mL    Comment: Vitamin D Status         25-OH Vitamin D: . Deficiency:                    <20 ng/mL Insufficiency:             20 - 29 ng/mL Optimal:                 > or = 30 ng/mL . For 25-OH Vitamin D testing on patients on  D2-supplementation and patients for whom quantitation  of D2 and D3 fractions is required, the QuestAssureD(TM) 25-OH VIT D, (D2,D3), LC/MS/MS is recommended: order  code [phone] (patients >74yr). . For more information on this test, go to: http://education.questdiagnostics.com/faq/FAQ163 (This link is being provided for  informational/educational purposes only.)   PSA     Status: None   Collection Time: 10/03/17  4:56 PM  Result Value Ref Range   PSA 0.5 <  OR = 4.0 ng/mL    Comment: The total PSA value from this assay system is  standardized against the WHO standard. The test  result will be approximately 20% lower when compared  to the equimolar-standardized total PSA (Beckman  Coulter). Comparison of serial PSA results should be  interpreted with this fact in mind. . This test was performed using the Siemens  chemiluminescent method. Values obtained from  different assay methods cannot be used interchangeably. PSA levels, regardless of value, should not be interpreted as absolute evidence of the presence or absence of disease.   INR/PT     Status: Abnormal   Collection Time: 10/10/17  3:02 PM  Result Value Ref Range   INR 1.9 (H) 0.8 - 1.0 ratio    Prothrombin Time 20.7 (H) 9.6 - 13.1 sec  CBC     Status: Abnormal   Collection Time: 10/12/17  7:09 PM  Result Value Ref Range   WBC 10.8 (H) 3.8 - 10.6 K/uL   RBC 4.25 (L) 4.40 - 5.90 MIL/uL   Hemoglobin 14.7 13.0 - 18.0 g/dL   HCT 42.2 40.0 - 52.0 %   MCV 99.2 80.0 - 100.0 fL   MCH 34.5 (H) 26.0 - 34.0 pg   MCHC 34.8 32.0 - 36.0 g/dL   RDW 14.0 11.5 - 14.5 %   Platelets 241 150 - 440 K/uL    Comment: Performed at [facility], [address] [place], [place] [address]  Comprehensive metabolic panel     Status: Abnormal   Collection Time: 10/12/17  7:09 PM  Result Value Ref Range   Sodium 137 135 - 145 mmol/L   Potassium 4.1 3.5 - 5.1 mmol/L   Chloride 102 101 - 111 mmol/L   CO2 21 (L) 22 - 32 mmol/L   Glucose, Bld 192 (H) 65 - 99 mg/dL   BUN 24 (H) 6 - 20 mg/dL   Creatinine, Ser 1.00 0.61 - 1.24 mg/dL   Calcium 8.7 (L) 8.9 - 10.3 mg/dL   Total Protein 8.0 6.5 - 8.1 g/dL   Albumin 3.9 3.5 - 5.0 g/dL   AST 36 15 - 41 U/L   ALT 26 17 - 63 U/L   Alkaline Phosphatase 78 38 - 126 U/L   Total Bilirubin 1.1 0.3 - 1.2 mg/dL   GFR calc non Af Amer >60 >60 mL/min   GFR calc Af Amer >60 >60 mL/min    Comment: (NOTE) The eGFR has been calculated using the CKD EPI equation. This calculation has not been validated in all clinical situations. eGFR's persistently <60 mL/min signify possible Chronic Kidney Disease.    Anion gap 14 5 - 15    Comment: Performed at [facility], [address] [place], [place] [address]  Troponin I     Status: None   Collection Time: 10/12/17  7:09 PM  Result Value Ref Range   Troponin I <0.03 <0.03 ng/mL    Comment: Performed at [facility], [address] [place], [place] [address]  Urinalysis, Complete w Microscopic     Status: Abnormal   Collection Time: 10/12/17  7:09 PM  Result Value Ref Range   Color, Urine YELLOW (A) YELLOW   APPearance CLEAR (A) CLEAR   Specific Gravity, Urine 1.021 1.005 - 1.030   pH 5.0  5.0 - 8.0   Glucose, UA NEGATIVE NEGATIVE mg/dL   Hgb urine dipstick NEGATIVE NEGATIVE   Bilirubin Urine NEGATIVE NEGATIVE   Ketones, ur NEGATIVE NEGATIVE mg/dL  Protein, ur NEGATIVE NEGATIVE mg/dL   Nitrite NEGATIVE NEGATIVE   Leukocytes, UA TRACE (A) NEGATIVE   RBC / HPF 0-5 0 - 5 RBC/hpf   WBC, UA 6-30 0 - 5 WBC/hpf   Bacteria, UA NONE SEEN NONE SEEN   Squamous Epithelial / LPF 0-5 (A) NONE SEEN    Comment: Performed at [facility], [address] [place], [place] [address]  Influenza panel by PCR (type A & B)     Status: None   Collection Time: 10/12/17  7:09 PM  Result Value Ref Range   Influenza A By PCR NEGATIVE NEGATIVE   Influenza B By PCR NEGATIVE NEGATIVE    Comment: (NOTE) The Xpert Xpress Flu assay is intended as an aid in the diagnosis of  influenza and should not be used as a sole basis for treatment.  This  assay is FDA approved for nasopharyngeal swab specimens only. Nasal  washings and aspirates are unacceptable for Xpert Xpress Flu testing. Performed at [facility], [address] [place], [place] [address]   Urine Culture     Status: Abnormal   Collection Time: 10/12/17  7:09 PM  Result Value Ref Range   Specimen Description      URINE, RANDOM Performed at [facility], [address] [place], [place] [address]    Special Requests      NONE Performed at [facility], [address] [place], [place] [address]    Culture MULTIPLE SPECIES PRESENT, SUGGEST RECOLLECTION (A)    Report Status 10/14/2017 FINAL   Protime-INR     Status: Abnormal   Collection Time: 10/12/17 10:15 PM  Result Value Ref Range   Prothrombin Time 20.7 (H) 11.4 - 15.2 seconds   INR 1.80     Comment: Performed at [facility], [address] [place], [place] [address]  INR/PT     Status: Abnormal   Collection Time: 10/18/17  2:52 PM  Result Value Ref Range   INR 1.9 (H) 0.8 - 1.0 ratio   Prothrombin Time 20.9 (H) 9.6 - 13.1  sec   Objective  Body mass index is 29.59 kg/m. Wt Readings from Last 3 Encounters:  10/18/17 194 lb 9.6 oz (88.3 kg)  10/12/17 192 lb (87.1 kg)  10/03/17 198 lb (89.8 kg)   Temp Readings from Last 3 Encounters:  10/18/17 98.1 F (36.7 C) (Oral)  10/13/17 97.8 F (36.6 C) (Oral)  10/03/17 97.8 F (36.6 C) (Oral)   BP Readings from Last 3 Encounters:  10/18/17 (!) 114/58  10/13/17 (!) 116/59  10/03/17 128/64   Pulse Readings from Last 3 Encounters:  10/18/17 (!) 59  10/12/17 66  10/03/17 (!) 58   O2 sat room air 96%   Physical Exam  Constitutional: He is oriented to person, place, and time and well-developed, well-nourished, and in no distress.  HENT:  Head: Normocephalic and atraumatic.  Mouth/Throat: Oropharynx is clear and moist and mucous membranes are normal.  Eyes: Conjunctivae are normal. Pupils are equal, round, and reactive to light.  Cardiovascular: Regular rhythm and normal heart sounds. Bradycardia present.  Pulmonary/Chest: Effort normal and breath sounds normal.  Musculoskeletal:       Right hip: He exhibits tenderness.  Moderate to severe ttp right hip   Neurological: He is alert and oriented to person, place, and time. Gait normal.  Walking with walker today   Skin: Skin is warm, dry and intact.  Psychiatric: Mood, memory, affect and judgment normal.  Nursing note and vitals reviewed.   Assessment   1. Right hip pain possibly OA, worse  2. Afib on coumadin subtherapeutic  3. Generalized weakness and reduced po improving. No clear etiology CXR neg, urine culture multiple species 4. HM Plan  1. Steroid injection tomorrow Dr. Rudene Christians  2. INR checked 1.9 will have pt take Coumadin 4 mg all days except M, W take 3 mg  Check INR in 2 weeks  3. Increase po intake and fluid intake doing better on keflex to complete tomorrow given in the ED 4.  Had flu shot  pna 23 had 02/2014  Given prevnar today  Disc Tdap and shingrix in future   PSA had nl  10/03/17  0.5 consider DRE in future Colonoscopy had 04/2017 [facility] h/o crohns on mesalamine 2.4 mg qd  Consider repeat DEXA reviewed DEXA +osteoporosis noted 06/07/04  Consider repeat CT chest h/o pulm nodules on CXR and CT chest 06/24/15 and former smoker  Refer to Derm Dr. Kellie Moor tbse c/w left temple and right hand Aks. H/o skin cancer   Of note reviewed meds last visit pt not taking Flomax, Detrol, Toviaz 4 mg qd, Pravastatin  Consider check lipid fasting in future unable to tolerate pravastatin consider zetia in future  Will ask pt at f/u if has sleep apnea as had prior sleep study unable to view   Provider: Dr. Olivia Mackie McLean-Scocuzza-Internal Medicine

## 2017-10-21 ENCOUNTER — Ambulatory Visit: Payer: Medicare Other

## 2017-10-24 ENCOUNTER — Telehealth: Payer: Self-pay

## 2017-10-24 ENCOUNTER — Other Ambulatory Visit: Payer: Self-pay | Admitting: Internal Medicine

## 2017-10-24 NOTE — Progress Notes (Signed)
W, Thurs, Fr, Sat, Sunday 4 mg qd coumadin  Mon ,Tuesday 3 mg coumadin   Thanks [place]

## 2017-10-24 NOTE — Telephone Encounter (Signed)
Copied from [place]. Topic: General - Other >> Oct 24, 2017  1:58 PM Bea Graff, NT wrote: Reason for CRM: Pts wife calling to let office know pt has a dermatology appt on 11/07/17 at 2:20pm in [place].

## 2017-10-25 ENCOUNTER — Other Ambulatory Visit: Payer: Medicare Other

## 2017-10-25 NOTE — Telephone Encounter (Signed)
FYI

## 2017-10-27 ENCOUNTER — Other Ambulatory Visit (INDEPENDENT_AMBULATORY_CARE_PROVIDER_SITE_OTHER): Payer: Medicare Other

## 2017-10-27 ENCOUNTER — Other Ambulatory Visit: Payer: Self-pay | Admitting: Gastroenterology

## 2017-10-27 ENCOUNTER — Telehealth: Payer: Self-pay | Admitting: Radiology

## 2017-10-27 DIAGNOSIS — R933 Abnormal findings on diagnostic imaging of other parts of digestive tract: Secondary | ICD-10-CM

## 2017-10-27 DIAGNOSIS — Z9229 Personal history of other drug therapy: Secondary | ICD-10-CM | POA: Diagnosis not present

## 2017-10-27 DIAGNOSIS — E118 Type 2 diabetes mellitus with unspecified complications: Secondary | ICD-10-CM | POA: Diagnosis not present

## 2017-10-27 DIAGNOSIS — R58 Hemorrhage, not elsewhere classified: Secondary | ICD-10-CM

## 2017-10-27 DIAGNOSIS — R197 Diarrhea, unspecified: Secondary | ICD-10-CM

## 2017-10-27 LAB — PROTIME-INR
INR: 2.1 ratio — AB (ref 0.8–1.0)
PROTHROMBIN TIME: 22.7 s — AB (ref 9.6–13.1)

## 2017-10-27 LAB — GLUCOSE, POCT (MANUAL RESULT ENTRY): POC Glucose: 241 mg/dl — AB (ref 70–99)

## 2017-10-27 NOTE — Telephone Encounter (Signed)
Just glucose not A1C too early    Thanks tMS

## 2017-10-27 NOTE — Telephone Encounter (Signed)
Pt came in today for labs for PT/INR. Pt asked if he could have his control sent off and checked. Advised pt I would ask. A1C was last checked on 10/03/17. Drew extra lavender tube incase. Would you like to do a POC glucose?

## 2017-11-04 DIAGNOSIS — R0989 Other specified symptoms and signs involving the circulatory and respiratory systems: Secondary | ICD-10-CM | POA: Diagnosis not present

## 2017-11-04 DIAGNOSIS — I493 Ventricular premature depolarization: Secondary | ICD-10-CM | POA: Diagnosis not present

## 2017-11-04 DIAGNOSIS — H539 Unspecified visual disturbance: Secondary | ICD-10-CM | POA: Diagnosis not present

## 2017-11-04 DIAGNOSIS — I2510 Atherosclerotic heart disease of native coronary artery without angina pectoris: Secondary | ICD-10-CM | POA: Diagnosis not present

## 2017-11-04 DIAGNOSIS — I10 Essential (primary) hypertension: Secondary | ICD-10-CM | POA: Diagnosis not present

## 2017-11-07 DIAGNOSIS — L720 Epidermal cyst: Secondary | ICD-10-CM | POA: Diagnosis not present

## 2017-11-07 DIAGNOSIS — C4441 Basal cell carcinoma of skin of scalp and neck: Secondary | ICD-10-CM | POA: Diagnosis not present

## 2017-11-07 DIAGNOSIS — Z85828 Personal history of other malignant neoplasm of skin: Secondary | ICD-10-CM | POA: Diagnosis not present

## 2017-11-07 DIAGNOSIS — L570 Actinic keratosis: Secondary | ICD-10-CM | POA: Diagnosis not present

## 2017-11-08 ENCOUNTER — Ambulatory Visit (INDEPENDENT_AMBULATORY_CARE_PROVIDER_SITE_OTHER): Payer: Medicare Other | Admitting: Urology

## 2017-11-08 ENCOUNTER — Encounter: Payer: Self-pay | Admitting: Urology

## 2017-11-08 VITALS — BP 120/62 | HR 63 | Ht 68.0 in | Wt 195.0 lb

## 2017-11-08 DIAGNOSIS — Z87448 Personal history of other diseases of urinary system: Secondary | ICD-10-CM

## 2017-11-08 DIAGNOSIS — I701 Atherosclerosis of renal artery: Secondary | ICD-10-CM

## 2017-11-08 DIAGNOSIS — N138 Other obstructive and reflux uropathy: Secondary | ICD-10-CM | POA: Diagnosis not present

## 2017-11-08 DIAGNOSIS — N3941 Urge incontinence: Secondary | ICD-10-CM | POA: Diagnosis not present

## 2017-11-08 DIAGNOSIS — Z87442 Personal history of urinary calculi: Secondary | ICD-10-CM

## 2017-11-08 DIAGNOSIS — N401 Enlarged prostate with lower urinary tract symptoms: Secondary | ICD-10-CM | POA: Diagnosis not present

## 2017-11-08 LAB — BLADDER SCAN AMB NON-IMAGING: Scan Result: 23

## 2017-11-08 NOTE — Progress Notes (Signed)
11/08/2017 1:55 PM   Chase Cabrera 12-16-1945 [id number]  Referring provider: McLean-Scocuzza, Chase Glow, Chase Cabrera [place], [place] [address]  Chief Complaint  Patient presents with  . Follow-up    HPI: 72 yo WM with BPH with LU TS and a history of nephrolithiasis who presents today for recheck on his urinary symptoms.    Patient was seen by Dr. Pilar Jarvis on 07/21/2017.   See below.   The patient is a 72 year old gentleman with past medical history of BPH on Flomax, coronary artery disease status post CABG, diabetes, and peripheral neuropathy presents today for follow up of urinary incontinence.  This has been going on approximately 2 years. He does associate the urge to urinate with the incontinence. He has to wear depends. He was originally started on Flomax for this issue by another urologist though it did not help. He feels he has a good stream and empties his bladder. He has no hesitancy. His PVR was 19 cc at his last follow up. DRE was 2 + benign.  He was started on Myrbetriq at his last visit for the above issues.  It does not help him. No change in his previous symptoms.  Note, the patient has severe neuropathy in his lower extremities and requires a walker for ambulation.  He was given samples of Toviaz at that visit and continue the tamsulosin.  He was to follow up in 3 months.  His insurance would not cover the Toviaz, so a prescription was sent in for Detrol.  He will start that medication once he has finished the [place] sample.    He then was having significant urinary symptoms.  He was having urgency, painful urination, nocturia, leakage of urine, intermittency and hesitancy.  He stated his urine has a foul smell to it.  He had not seen blood in his urine.  This had been occurring for the last 3 to 4 days.  His UA was positive for 11-30 RBC's.   His PVR was 94 mL.  He had not had fevers, chills, nausea or vomiting.  His urine culture was negative.  He was scheduled for a  hematuria workup with CTU and cystoscopy, but he passed a stone in the interim.    RUS performed on 09/23/2017 noted a 15 mm cyst in the lower pole the right kidney. No other abnormalities.  His UA was negative.    His I PSS previous score was 12/5.   His I PSS score today is 25/5.    His complaints today are frequency, urgency, nocturia, incontinence, intermittency and hesitancy.   He states he only drank water for the last 6 weeks, but his wife states he sneaked diet [place].  Patient denies any gross hematuria, dysuria or suprapubic/flank pain.  Patient denies any fevers, chills, nausea or vomiting. He states his biggest bother is when he stands up the urine just pours out.  He did not find tamsulosin, Myrbetriq or Toviaz helpful.  His PVR is 23 mL.    He spends a majority of his time recumbent in a recliner chair due to his hip pain.  He states when he stands up to go to the rest room, the urine just pours out.  He states he is not going longer than two hours in between trips to the restroom.   IPSS    Row Name 09/26/17 1300 11/08/17 1300       International Prostate Symptom Score   How often have you had the sensation  of not emptying your bladder?  Almost always  Less than 1 in 5    How often have you had to urinate less than every two hours?  About half the time  Less than half the time    How often have you found you stopped and started again several times when you urinated?  Almost always  About half the time    How often have you found it difficult to postpone urination?  Almost always  More than half the time    How often have you had a weak urinary stream?  About half the time  Not at All    How often have you had to strain to start urination?  Not at All  Not at All    How many times did you typically get up at night to urinate?  2 Times  2 Times    Total IPSS Score  23  12      Quality of Life due to urinary symptoms   If you were to spend the rest of your life with your urinary  condition just the way it is now how would you feel about that?  Mostly Disatisfied  Unhappy       Score:  1-7 Mild 8-19 Moderate 20-35 Severe       PMH: Past Medical History:  Diagnosis Date  . Arthritis   . Atrial fibrillation ([place])   . CAD (coronary artery disease)   . CHF (congestive heart failure) ([place])   . Chicken pox   . Corneal dystrophy    bilateral  . Diabetes ([place])   . Dysrhythmia    A-FIB AND PAF  . GERD (gastroesophageal reflux disease)   . Gout   . History of shingles   . Hyperlipemia   . Hypertension   . Kidney stones   . Myocardial infarction ([facility])    x4  . Peripheral neuropathy   . Peripheral neuropathy   . Peripheral neuropathy   . PUD (peptic ulcer disease)   . Renal artery stenosis ([place])   . Renal artery stenosis ([place])   . Salzmann's nodular dystrophy 2012  . Spinal stenosis     Surgical History: Past Surgical History:  Procedure Laterality Date  . APPLICATION VERTERBRAL DEFECT PROSTHETIC  05/01/2013  . ARTHRODESIS ANTERIOR LUMBAR SPINE  05/01/2013  . BACK SURGERY     lumbar fusion  . CARDIAC CATHETERIZATION    . CARDIAC ELECTROPHYSIOLOGY STUDY AND ABLATION    . CARDIAC SURGERY    . CARDIOVERSION    . CHOLECYSTECTOMY    . COLONOSCOPY WITH PROPOFOL N/A 04/09/2016   Procedure: COLONOSCOPY WITH PROPOFOL;  Surgeon: Lollie Sails, Chase Cabrera;  [facility]: [facility] ENDOSCOPY;  Service: Endoscopy;  Laterality: N/A;  . CORNEAL EYE SURGERY Bilateral 07/28/11    09/22/2011  . CORONARY ANGIOPLASTY    . CORONARY ANGIOPLASTY WITH STENT PLACEMENT  03/28/2015   Distal 80% to normal Stent, Dilation Balloon  . CORONARY ARTERY BYPASS GRAFT    . EYE SURGERY     bilateral cataract  . foot and ankle repair Right 2005  . FRACTURE SURGERY     ANKLE PLATE AND SCREWS  . GALLBLADER    . JOINT REPLACEMENT     left total hip 11/11/15  . KNEE ARTHROSCOPY Right   . LUMBAR SPINE FUSION ONE LEVEL  05/03/2013  . RENAL ARTERY STENT Right   . ROTATOR CUFF REPAIR Right 2006    . TONSILLECTOMY    . TOTAL HIP  ARTHROPLASTY Left 11/11/2015   Procedure: TOTAL HIP ARTHROPLASTY ANTERIOR APPROACH;  Surgeon: Hessie Knows, Chase Cabrera;  Location: [facility] ORS;  Service: Orthopedics;  Laterality: Left;  . TRANSCATH PLACEMENT INTRAVASCULAR STENT LEG  03/2015    Home Medications:  Allergies as of 11/08/2017      Reactions   Allopurinol Diarrhea, Other (See Comments), Nausea And Vomiting   Other Reaction: GI Upset   Atenolol Other (See Comments)   Other Reaction: bradycardia   Atorvastatin Other (See Comments)   Other Reaction: muscle aches   Ramipril Rash   Rosuvastatin Other (See Comments)   Muscle aches   Simvastatin Other (See Comments)   Other Reaction: MUSCLE ACHES (Zocor)   Valsartan Other (See Comments)   Other Reaction: facial swelling   Cardizem [diltiazem] Rash      Medication List        Accurate as of 11/08/17  1:55 PM. Always use your most recent med list.          amLODipine 10 MG tablet Commonly known as:  NORVASC TAKE 1 TABLET BY MOUTH ONCE DAILY   azelastine 0.1 % nasal spray Commonly known as:  ASTELIN Place 2 sprays into both nostrils 2 (two) times daily. Use in each nostril as directed   BENTYL 10 MG capsule Generic drug:  dicyclomine Take 10 mg by mouth 3 (three) times daily before meals.   carvedilol 25 MG tablet Commonly known as:  COREG Take 1 tablet (25 mg total) by mouth 2 (two) times daily with a meal.   Cholecalciferol 50000 units Tabs Take 1 tablet by mouth once a week.   clopidogrel 75 MG tablet Commonly known as:  PLAVIX Take 75 mg by mouth daily.   colchicine 0.6 MG tablet TAKE 1 TABLET BY MOUTH ONCE DAILY   diclofenac sodium 1 % Gel Commonly known as:  VOLTAREN Apply 2 g topically 4 (four) times daily as needed.   fesoterodine 4 MG Tb24 tablet Commonly known as:  TOVIAZ Take 1 tablet (4 mg total) daily by mouth.   furosemide 20 MG tablet Commonly known as:  LASIX Take 1 tablet (20 mg total) by mouth daily.    gabapentin 300 MG capsule Commonly known as:  NEURONTIN take 1 capsule by mouth three times a day   hydrALAZINE 100 MG tablet Commonly known as:  APRESOLINE take 1 tablet by mouth three times a day   hydrocortisone 2.5 % cream Apply topically 2 (two) times daily.   insulin lispro 100 UNIT/ML KiwkPen Commonly known as:  HUMALOG Take 15 - 25 units up to three times daily as directed   isosorbide mononitrate 120 MG 24 hr tablet Commonly known as:  IMDUR TAKE 1 TABLET BY MOUTH EVERY DAY   LEVEMIR FLEXTOUCH 100 UNIT/ML Pen Generic drug:  Insulin Detemir Inject 90 Units into the skin daily at 10 pm.   mesalamine 1.2 g EC tablet Commonly known as:  LIALDA Take 2.4 g by mouth daily with breakfast. [facility]   metFORMIN 1000 MG tablet Commonly known as:  GLUCOPHAGE Take 1 tablet by mouth 2 (two) times daily.   MULTI-VITAMINS Tabs Take 1 tablet by mouth daily.   nitroGLYCERIN 0.4 MG SL tablet Commonly known as:  NITROSTAT Place 1 tablet (0.4 mg total) under the tongue every 5 (five) minutes x 3 doses as needed. For chest pain.  Call 911 if no relief after 3 tablets   omeprazole 40 MG capsule Commonly known as:  PRILOSEC Take 40 mg by mouth daily.  pravastatin 40 MG tablet Commonly known as:  PRAVACHOL TAKE 1 TABLET BY MOUTH ONCE DAILY   probenecid 500 MG tablet Commonly known as:  BENEMID Take 1 tablet by mouth 2 (two) times daily.   tamsulosin 0.4 MG Caps capsule Commonly known as:  FLOMAX take 1 capsule by mouth once daily   tolterodine 2 MG tablet Commonly known as:  DETROL Take 1 tablet (2 mg total) by mouth 2 (two) times daily.   vitamin C 500 MG tablet Commonly known as:  ASCORBIC ACID Take 1,000 mg by mouth daily.   Vitamin D (Ergocalciferol) 50000 units Caps capsule Commonly known as:  DRISDOL Take 1 capsule (50,000 Units total) by mouth every 7 (seven) days.   Vitamin E 400 units Tabs Take 400 Units by mouth 2 (two) times daily.   warfarin 3 MG  tablet Commonly known as:  COUMADIN 1 tablet  Monday, Wednesday, Friday.   warfarin 4 MG tablet Commonly known as:  COUMADIN 1 tablet Tuesday, Thursday, Saturday, and Sunday.       Allergies:  Allergies  Allergen Reactions  . Allopurinol Diarrhea, Other (See Comments) and Nausea And Vomiting    Other Reaction: GI Upset  . Atenolol Other (See Comments)    Other Reaction: bradycardia  . Atorvastatin Other (See Comments)    Other Reaction: muscle aches  . Ramipril Rash  . Rosuvastatin Other (See Comments)    Muscle aches  . Simvastatin Other (See Comments)    Other Reaction: MUSCLE ACHES (Zocor)  . Valsartan Other (See Comments)    Other Reaction: facial swelling  . Cardizem [Diltiazem] Rash    Family History: Family History  Problem Relation Age of Onset  . CVA Father   . Stroke Father   . Lung cancer Mother   . Arthritis Mother   . Stomach cancer Sister   . Colon cancer Sister   . Diabetes Brother     Social History:  reports that he quit smoking about 55 years ago. His smoking use included cigarettes. He has a 3.00 pack-year smoking history. He quit smokeless tobacco use about 48 years ago. His smokeless tobacco use included chew. He reports that he does not drink alcohol or use drugs.  ROS: UROLOGY Frequent Urination?: Yes Hard to postpone urination?: Yes Burning/pain with urination?: No Get up at night to urinate?: Yes Leakage of urine?: Yes Urine stream starts and stops?: Yes Trouble starting stream?: Yes Do you have to strain to urinate?: No Blood in urine?: No Urinary tract infection?: No Sexually transmitted disease?: No Injury to kidneys or bladder?: No Painful intercourse?: No Weak stream?: No Erection problems?: Yes Penile pain?: No  Gastrointestinal Nausea?: No Vomiting?: No Indigestion/heartburn?: No Diarrhea?: Yes Constipation?: No  Constitutional Fever: No Night sweats?: Yes Weight loss?: No Fatigue?: No  Skin Skin  rash/lesions?: No Itching?: No  Eyes Blurred vision?: No Double vision?: No  Ears/Nose/Throat Sore throat?: No Sinus problems?: Yes  Hematologic/Lymphatic Swollen glands?: No Easy bruising?: Yes  Cardiovascular Leg swelling?: No Chest pain?: No  Respiratory Cough?: Yes Shortness of breath?: No  Endocrine Excessive thirst?: Yes  Musculoskeletal Back pain?: No Joint pain?: Yes  Neurological Headaches?: No Dizziness?: No  Psychologic Depression?: No Anxiety?: No  Physical Exam: BP 120/62   Pulse 63   Ht 5' 8"  (1.727 m)   Wt 195 lb (88.5 kg)   BMI 29.65 kg/m   Constitutional: Well nourished. Alert and oriented, No acute distress. HEENT: [place] AT, moist mucus membranes. Trachea midline, no  masses. Cardiovascular: No clubbing, cyanosis, or edema. Respiratory: Normal respiratory effort, no increased work of breathing. Skin: No rashes, bruises or suspicious lesions. Lymph: No cervical or inguinal adenopathy. Neurologic: Grossly intact, no focal deficits, moving all 4 extremities. Psychiatric: Normal mood and affect.  Laboratory Data: Lab Results  Component Value Date   WBC 10.8 (H) 10/12/2017   HGB 14.7 10/12/2017   HCT 42.2 10/12/2017   MCV 99.2 10/12/2017   PLT 241 10/12/2017   Urinalysis n/a  I have reviewed the labs.  Pertinent Imaging Results for Chase Cabrera, Chase Cabrera (MRN [id number]) as of 11/10/2017 08:36  Ref. Range 11/08/2017 13:46  Scan Result Unknown 23     Assessment & Plan:    1. Urge incontinence  - failed Myrbetriq and Toviaz and dietary changes (no sodas)  - will schedule for cystoscopy to rule out stricture but I am suspicious that his incontinence is due to his weight and non ambulatory state - may consider UDS if cystoscopy is negative  2.  BPH with LUTS  - IPSS score is 12/5, it is slightly improved  - Continue conservative management, avoiding bladder irritants and timed voiding's - no more sodas  - most bothersome symptoms  is/are incontinence  - discontinue tamsulosin 0.4 mg    - cystoscopy pending  3. History of nephrolithiasis  - stone composition is unknown  - RUS negative for further nephrolithiasis and hydronephrosis   4. History of hematuria  - likely the result of the passage of the stone  - UA at last visit was negative - no further reports of gross hematuria   Return for cystoscopy .  Zara Council, [facility] [address] [place], [place] [address] [phone]

## 2017-11-08 NOTE — Patient Instructions (Addendum)
Cystoscopy  Cystoscopy is a procedure that is used to help diagnose and sometimes treat conditions that affect that lower urinary tract. The lower urinary tract includes the bladder and the tube that drains urine from the bladder out of the body (urethra). Cystoscopy is performed with a thin, tube-shaped instrument with a light and camera at the end (cystoscope). The cystoscope may be hard (rigid) or flexible, depending on the goal of the procedure.The cystoscope is inserted through the urethra, into the bladder.  Cystoscopy may be recommended if you have:   Urinary tractinfections that keep coming back (recurring).   Blood in the urine (hematuria).   Loss of bladder control (urinary incontinence) or an overactive bladder.   Unusual cells found in a urine sample.   A blockage in the urethra.   Painful urination.   An abnormality in the bladder found during an intravenous pyelogram (IVP) or CT scan.    Cystoscopy may also be done to remove a sample of tissue to be examined under a microscope (biopsy).  Tell a health care provider about:   Any allergies you have.   All medicines you are taking, including vitamins, herbs, eye drops, creams, and over-the-counter medicines.   Any problems you or family members have had with anesthetic medicines.   Any blood disorders you have.   Any surgeries you have had.   Any medical conditions you have.   Whether you are pregnant or may be pregnant.  What are the risks?  Generally, this is a safe procedure. However, problems may occur, including:   Infection.   Bleeding.   Allergic reactions to medicines.   Damage to other structures or organs.    What happens before the procedure?   Ask your health care provider about:  ? Changing or stopping your regular medicines. This is especially important if you are taking diabetes medicines or blood thinners.  ? Taking medicines such as aspirin and ibuprofen. These medicines can thin your blood. Do not take these medicines  before your procedure if your health care provider instructs you not to.   Follow instructions from your health care provider about eating or drinking restrictions.   You may be given antibiotic medicine to help prevent infection.   You may have an exam or testing, such as X-rays of the bladder, urethra, or kidneys.   You may have urine tests to check for signs of infection.   Plan to have someone take you home after the procedure.  What happens during the procedure?   To reduce your risk of infection,your health care team will wash or sanitize their hands.   You will be given one or more of the following:  ? A medicine to help you relax (sedative).  ? A medicine to numb the area (local anesthetic).   The area around the opening of your urethra will be cleaned.   The cystoscope will be passed through your urethra into your bladder.   Germ-free (sterile)fluid will flow through the cystoscope to fill your bladder. The fluid will stretch your bladder so that your surgeon can clearly examine your bladder walls.   The cystoscope will be removed and your bladder will be emptied.  The procedure may vary among health care providers and hospitals.  What happens after the procedure?   You may have some soreness or pain in your abdomen and urethra. Medicines will be available to help you.   You may have some blood in your urine.   Do not   drive for 24 hours if you received a sedative.  This information is not intended to replace advice given to you by your health care provider. Make sure you discuss any questions you have with your health care provider.  Document Released: 08/27/2000 Document Revised: 01/08/2016 Document Reviewed: 07/17/2015  Elsevier Interactive Patient Education  2018 [employer]

## 2017-11-10 ENCOUNTER — Other Ambulatory Visit: Payer: Medicare Other

## 2017-11-10 ENCOUNTER — Emergency Department
Admission: EM | Admit: 2017-11-10 | Discharge: 2017-11-10 | Disposition: A | Discharge status: Home / Self Care | Payer: Medicare Other | Attending: Emergency Medicine | Admitting: Emergency Medicine

## 2017-11-10 ENCOUNTER — Other Ambulatory Visit: Payer: Self-pay

## 2017-11-10 ENCOUNTER — Telehealth: Payer: Self-pay | Admitting: Internal Medicine

## 2017-11-10 DIAGNOSIS — Z7901 Long term (current) use of anticoagulants: Secondary | ICD-10-CM | POA: Insufficient documentation

## 2017-11-10 DIAGNOSIS — Z794 Long term (current) use of insulin: Secondary | ICD-10-CM | POA: Diagnosis not present

## 2017-11-10 DIAGNOSIS — E119 Type 2 diabetes mellitus without complications: Secondary | ICD-10-CM | POA: Diagnosis not present

## 2017-11-10 DIAGNOSIS — T148XXA Other injury of unspecified body region, initial encounter: Secondary | ICD-10-CM

## 2017-11-10 DIAGNOSIS — L7621 Postprocedural hemorrhage and hematoma of skin and subcutaneous tissue following a dermatologic procedure: Secondary | ICD-10-CM | POA: Diagnosis not present

## 2017-11-10 DIAGNOSIS — I2510 Atherosclerotic heart disease of native coronary artery without angina pectoris: Secondary | ICD-10-CM | POA: Diagnosis not present

## 2017-11-10 DIAGNOSIS — Z4800 Encounter for change or removal of nonsurgical wound dressing: Secondary | ICD-10-CM | POA: Diagnosis not present

## 2017-11-10 DIAGNOSIS — Z79899 Other long term (current) drug therapy: Secondary | ICD-10-CM | POA: Insufficient documentation

## 2017-11-10 DIAGNOSIS — Z48817 Encounter for surgical aftercare following surgery on the skin and subcutaneous tissue: Secondary | ICD-10-CM | POA: Diagnosis not present

## 2017-11-10 DIAGNOSIS — Z951 Presence of aortocoronary bypass graft: Secondary | ICD-10-CM | POA: Insufficient documentation

## 2017-11-10 DIAGNOSIS — Z87891 Personal history of nicotine dependence: Secondary | ICD-10-CM | POA: Diagnosis not present

## 2017-11-10 DIAGNOSIS — I119 Hypertensive heart disease without heart failure: Secondary | ICD-10-CM | POA: Insufficient documentation

## 2017-11-10 DIAGNOSIS — Z9889 Other specified postprocedural states: Secondary | ICD-10-CM

## 2017-11-10 LAB — PROTIME-INR
INR: 2.06
PROTHROMBIN TIME: 23 s — AB (ref 11.4–15.2)

## 2017-11-10 LAB — APTT: aPTT: 37 seconds — ABNORMAL HIGH (ref 24–36)

## 2017-11-10 NOTE — Discharge Instructions (Signed)
Surgicel was placed to help with creating a clot/scab and an Ace wrap was placed to create some extra pressure.  Please loosen the Ace wrap if you have numb or tingling fingers, blue or cold fingers, or if it is uncomfortable.  We discussed, being on a blood thinner is a risk/benefit situation.  We discussed go ahead and stop your warfarin tonight and tomorrow night to help your body make a stronger blood clot so we will stop bleeding.  Then you may re-start until you are presurgical time.  Return to the emergency department immediately if you have any worsening concerns in terms of bleeding that will not stop with pressure, or any signs of stroke including numbness, weakness, confusion or altered mental status, or any other symptoms concerning to you.

## 2017-11-10 NOTE — Telephone Encounter (Signed)
Copied from [place]. Topic: General - Other >> Nov 10, 2017  3:40 PM Yvette Rack wrote: Reason for CRM: patient wife calling stating that her husband was in the ED today and they did an INR on him with reading of 2.1  she just wanted to let provider know that lab was done

## 2017-11-10 NOTE — Telephone Encounter (Signed)
fyi

## 2017-11-10 NOTE — ED Triage Notes (Signed)
Pt states he had a skin biopsy of the right hand on Monday and since yesterday can not get the bleeding to stop. States he takes coumadin. Bandage in place on arrival. Pt uses a walker to ambulate.

## 2017-11-10 NOTE — ED Provider Notes (Signed)
[facility] Department Provider Note ____________________________________________   I have reviewed the triage vital signs and the triage nursing note.  HISTORY  Chief Complaint Wound Check (bleeding wound)   Historian Patient (hard of hearing) and wife  HPI Chase Cabrera is a 72 y.o. male presents for bleeding from wound site where he had a skin biopsy on Monday.  He does take warfarin it sounds like as a result of prior history of A. fib.  He has a history of cardiac bypass many many years ago.  Patient states the wound has been intermittently bleeding and blood all over the sheets last night.  It is still oozing through the bandage that they had placed.  No other areas of easy bruising or bleeding.  No pain.  Symptoms are mild.   Past Medical History:  Diagnosis Date  . Arthritis   . Atrial fibrillation ([place])   . CAD (coronary artery disease)   . CHF (congestive heart failure) ([place])   . Chicken pox   . Corneal dystrophy    bilateral  . Diabetes ([place])   . Dysrhythmia    A-FIB AND PAF  . GERD (gastroesophageal reflux disease)   . Gout   . History of shingles   . Hyperlipemia   . Hypertension   . Kidney stones   . Myocardial infarction (HCC)    x4  . Peripheral neuropathy   . Peripheral neuropathy   . Peripheral neuropathy   . PUD (peptic ulcer disease)   . Renal artery stenosis ([place])   . Renal artery stenosis ([place])   . Salzmann's nodular dystrophy 2012  . Spinal stenosis     Patient Active Problem List   Diagnosis Date Noted  . Crohn's disease ([place]) 10/20/2017  . Pulmonary nodule 10/07/2017  . History of skin cancer 10/07/2017  . Rib pain on right side 03/07/2017  . Osteoarthritis 04/08/2016  . Spinal stenosis, lumbar 03/25/2016  . DM type 2 with diabetic peripheral neuropathy ([place]) 07/15/2014  . Gout 05/04/2013  . Long term current use of anticoagulant 07/29/2011  . Renal artery stenosis ([place]) 07/01/2011  .  Atrial fibrillation ([place]) 06/30/2011  . CAD (coronary artery disease) 06/30/2011  . Hyperlipidemia 06/30/2011  . Essential hypertension 06/30/2011    Past Surgical History:  Procedure Laterality Date  . APPLICATION VERTERBRAL DEFECT PROSTHETIC  05/01/2013  . ARTHRODESIS ANTERIOR LUMBAR SPINE  05/01/2013  . BACK SURGERY     lumbar fusion  . CARDIAC CATHETERIZATION    . CARDIAC ELECTROPHYSIOLOGY STUDY AND ABLATION    . CARDIAC SURGERY    . CARDIOVERSION    . CHOLECYSTECTOMY    . COLONOSCOPY WITH PROPOFOL N/A 04/09/2016   Procedure: COLONOSCOPY WITH PROPOFOL;  Surgeon: Lollie Sails, MD;  Location: [facility] ENDOSCOPY;  Service: Endoscopy;  Laterality: N/A;  . CORNEAL EYE SURGERY Bilateral 07/28/11    09/22/2011  . CORONARY ANGIOPLASTY    . CORONARY ANGIOPLASTY WITH STENT PLACEMENT  03/28/2015   Distal 80% to normal Stent, Dilation Balloon  . CORONARY ARTERY BYPASS GRAFT    . EYE SURGERY     bilateral cataract  . foot and ankle repair Right 2005  . FRACTURE SURGERY     ANKLE PLATE AND SCREWS  . GALLBLADER    . JOINT REPLACEMENT     left total hip 11/11/15  . KNEE ARTHROSCOPY Right   . LUMBAR SPINE FUSION ONE LEVEL  05/03/2013  . RENAL ARTERY STENT Right   . ROTATOR CUFF REPAIR  Right 2006  . TONSILLECTOMY    . TOTAL HIP ARTHROPLASTY Left 11/11/2015   Procedure: TOTAL HIP ARTHROPLASTY ANTERIOR APPROACH;  Surgeon: Hessie Knows, MD;  Location: [facility] ORS;  Service: Orthopedics;  Laterality: Left;  . TRANSCATH PLACEMENT INTRAVASCULAR STENT LEG  03/2015    Prior to Admission medications   Medication Sig Start Date End Date Taking? Authorizing Provider  amLODipine (NORVASC) 10 MG tablet TAKE 1 TABLET BY MOUTH ONCE DAILY Patient taking differently: Take 5 mg by mouth twice daily 09/16/17   McLean-Scocuzza, Nino Glow, MD  azelastine (ASTELIN) 0.1 % nasal spray Place 2 sprays into both nostrils 2 (two) times daily. Use in each nostril as directed 09/09/16   Coral Spikes, DO  carvedilol (COREG)  25 MG tablet Take 1 tablet (25 mg total) by mouth 2 (two) times daily with a meal. 10/06/17   McLean-Scocuzza, Nino Glow, MD  Cholecalciferol 50000 units TABS Take 1 tablet by mouth once a week. 10/07/17   McLean-Scocuzza, Nino Glow, MD  clopidogrel (PLAVIX) 75 MG tablet Take 75 mg by mouth daily.    [provider]  colchicine 0.6 MG tablet TAKE 1 TABLET BY MOUTH ONCE DAILY 07/12/17   Leone Haven, MD  diclofenac sodium (VOLTAREN) 1 % GEL Apply 2 g topically 4 (four) times daily as needed (pain).     [provider]  dicyclomine (BENTYL) 10 MG capsule Take 10 mg by mouth 3 (three) times daily before meals.     [provider]  fesoterodine (TOVIAZ) 4 MG TB24 tablet Take 1 tablet (4 mg total) daily by mouth. Patient not taking: Reported on 11/09/2017 07/21/17   Nickie Retort, MD  furosemide (LASIX) 20 MG tablet Take 1 tablet (20 mg total) by mouth daily. Patient not taking: Reported on 11/09/2017 10/04/17   McLean-Scocuzza, Nino Glow, MD  gabapentin (NEURONTIN) 300 MG capsule take 1 capsule by mouth three times a day Patient taking differently: Take 600 mg by mouth 3 times daily 04/19/16   Coral Spikes, DO  hydrALAZINE (APRESOLINE) 100 MG tablet take 1 tablet by mouth three times a day 12/27/16   Coral Spikes, DO  hydrocortisone 2.5 % cream Apply topically 2 (two) times daily. Patient taking differently: Apply 1 application topically daily as needed (rash).  10/03/17   McLean-Scocuzza, Nino Glow, MD  insulin lispro (HUMALOG) 100 UNIT/ML KiwkPen Take 15 - 30 units up to three times daily as directed 05/03/16   [provider]  isosorbide mononitrate (IMDUR) 120 MG 24 hr tablet TAKE 1 TABLET BY MOUTH EVERY DAY 10/07/17   McLean-Scocuzza, Nino Glow, MD  LEVEMIR FLEXTOUCH 100 UNIT/ML Pen Inject 90 Units into the skin daily at 10 pm.  08/24/16   [provider]  mesalamine (LIALDA) 1.2 g EC tablet Take 2.4 g by mouth daily with breakfast. [facility]    [provider]  metFORMIN (GLUCOPHAGE) 1000 MG tablet Take 1,000 mg by mouth 2 (two) times daily.  01/01/15   [provider]  Multiple Vitamin (MULTI-VITAMINS) TABS Take 1 tablet by mouth daily. 04/15/09   [provider]  nitroGLYCERIN (NITROSTAT) 0.4 MG SL tablet Place 1 tablet (0.4 mg total) under the tongue every 5 (five) minutes x 3 doses as needed. For chest pain.  Call 911 if no relief after 3 tablets 10/03/17 11/02/21  McLean-Scocuzza, Nino Glow, MD  omeprazole (PRILOSEC) 40 MG capsule Take 40 mg by mouth 2 (two) times daily.     [provider]  pravastatin (PRAVACHOL) 40 MG tablet TAKE 1 TABLET BY MOUTH ONCE DAILY 10/07/17   McLean-Scocuzza, Nino Glow, MD  probenecid (BENEMID) 500 MG tablet Take 500 mg by mouth 2 (two) times daily.  01/09/15   [provider]  tamsulosin (FLOMAX) 0.4 MG CAPS capsule take 1 capsule by mouth once daily Patient not taking: Reported on 11/09/2017 08/10/16   Coral Spikes, DO  tolterodine (DETROL) 2 MG tablet Take 1 tablet (2 mg total) by mouth 2 (two) times daily. Patient not taking: Reported on 11/09/2017 08/09/17   Nickie Retort, MD  vitamin C (ASCORBIC ACID) 500 MG tablet Take 1,000 mg by mouth daily.    [provider]  Vitamin E 400 units TABS Take 400 Units by mouth 2 (two) times daily.    [provider]  warfarin (COUMADIN) 3 MG tablet 1 tablet  Monday, Wednesday, Friday. Patient taking differently: Take 3 mg by mouth 2 (two) times a week. Mondays and Tuesdays 11/16/16   Coral Spikes, DO  warfarin (COUMADIN) 4 MG tablet 1 tablet Tuesday, Thursday, Saturday, and Sunday. Patient taking differently: Take 4 mg by mouth See admin instructions. Take 4 mg once daily Wednesday through Sunday 10/03/17   McLean-Scocuzza, Nino Glow, MD    Allergies  Allergen Reactions  . Allopurinol Diarrhea, Other (See Comments) and Nausea And Vomiting    Other Reaction: GI Upset  . Atenolol Other (See Comments)    Other  Reaction: bradycardia  . Atorvastatin Other (See Comments)    Other Reaction: muscle aches  . Ramipril Rash  . Rosuvastatin Other (See Comments)    Muscle aches  . Simvastatin Other (See Comments)    Other Reaction: MUSCLE ACHES (Zocor)  . Valsartan Other (See Comments)    Other Reaction: facial swelling  . Cardizem [Diltiazem] Rash    Family History  Problem Relation Age of Onset  . CVA Father   . Stroke Father   . Lung cancer Mother   . Arthritis Mother   . Stomach cancer Sister   . Colon cancer Sister   . Diabetes Brother     Social History Social History   Tobacco Use  . Smoking status: Former Smoker    Packs/day: 1.00    Years: 3.00    Pack years: 3.00    Types: Cigarettes    Last attempt to quit: 06/30/1962    Years since quitting: 55.4  . Smokeless tobacco: Former Systems developer    Types: Chew    Quit date: 12/05/1968  Substance Use Topics  . Alcohol use: No  . Drug use: No    Review of Systems  Constitutional: Negative for recent illnesses. Eyes: Negative for red eyes. ENT: Negative for nosebleed. Cardiovascular: Negative for chest pain. Respiratory: Negative for shortness of breath. Gastrointestinal: Negative for abdominal pain.. Genitourinary:  Musculoskeletal: Negative for hand pain. Skin: Negative for rash. Neurological: Negative for headache.  ____________________________________________   PHYSICAL EXAM:  VITAL SIGNS: ED Triage Vitals  Enc Vitals Group     BP 11/10/17 0751 (!) 149/54     Pulse Rate 11/10/17 0751 62     Resp 11/10/17 0751 18     Temp 11/10/17 0751 (!) 97.5 F (36.4 C)     Temp Source 11/10/17 0751 Oral     SpO2 11/10/17 0751 96 %     Weight 11/10/17 0748 195 lb (88.5 kg)     Height 11/10/17 0748 5' 8"  (1.727 m)     Head Circumference --  Peak Flow --      Pain Score 11/10/17 0748 0     Pain Loc --      Pain Edu? --      Excl. in [place]? --      Constitutional: Alert and oriented. Well appearing and in no  distress. HEENT   Head: Normocephalic and atraumatic.      Eyes: Conjunctivae are normal. Pupils equal and round.       Ears:   Hard of hearing.      Nose: No congestion/rhinnorhea.   Mouth/Throat: Mucous membranes are moist.   Neck: No stridor. Cardiovascular/Chest: Normal rate, regular rhythm.  No murmurs, rubs, or gallops. Respiratory: Normal respiratory effort without tachypnea nor retractions. Gastrointestinal:  Genitourinary/rectal:Deferred Musculoskeletal: Nontender with normal range of motion in all extremities.  Neurologic:  Normal speech and language. No gross or focal neurologic deficits are appreciated. Skin: Right hand top of the hand, punctate wound that is slowly continuing to use.  Nontender.  No evidence for infection. Psychiatric: Mood and affect are normal. Speech and behavior are normal. Patient exhibits appropriate insight and judgment.   ____________________________________________  LABS (pertinent positives/negatives) I, Lisa Roca, MD the attending physician have reviewed the labs noted below.  Labs Reviewed  PROTIME-INR - Abnormal; Notable for the following components:      Result Value   Prothrombin Time 23.0 (*)    All other components within normal limits  APTT - Abnormal; Notable for the following components:   aPTT 37 (*)    All other components within normal limits    __________________________________________  PROCEDURES  Procedure(s) performed: Dressed wound with Surgicel, followed by Ace wrap to the left hand over top of the punctate oozing wound.  Form by myself, Dr. Reita Cliche MD.  Critical Care performed: None   ____________________________________________  ED COURSE / ASSESSMENT AND PLAN  Pertinent labs & imaging results that were available during my care of the patient were reviewed by me and considered in my medical decision making (see chart for details).    Patient's wound is continuing to ooze, his INR is therapeutic,  but is most likely the reason why continues to ooze.  We tried simple pressure, it does not seem like that is going to be enough.  I Did place Surgicel with a bit of a pressure dressing with an Ace wrap and that seems to be providing adequate hemostatic control.  We discussed in the case of risk and benefit for bleeding, versus stroke prevention, to be off the Coumadin for 2 days to aid creating a good hemostatic control and clot and scab to the right hand wound.      Patient / Family / Caregiver informed of clinical course, medical decision-making process, and agree with plan.   I discussed return precautions, follow-up instructions, and discharge instructions with patient and/or family.  Discharge Instructions : Surgicel was placed to help with creating a clot/scab and an Ace wrap was placed to create some extra pressure.  Please loosen the Ace wrap if you have numb or tingling fingers, blue or cold fingers, or if it is uncomfortable.  We discussed, being on a blood thinner is a risk/benefit situation.  We discussed go ahead and stop your warfarin tonight and tomorrow night to help your body make a stronger blood clot so we will stop bleeding.  Then you may re-start until you are presurgical time.  Return to the emergency department immediately if you have any worsening concerns in terms of bleeding  that will not stop with pressure, or any signs of stroke including numbness, weakness, confusion or altered mental status, or any other symptoms concerning to you.    ___________________________________________   FINAL CLINICAL IMPRESSION(S) / ED DIAGNOSES   Final diagnoses:  Status post biopsy of skin  Bleeding from wound      ___________________________________________        Note: This dictation was prepared with Dragon dictation. Any transcriptional errors that result from this process are unintentional    Lisa Roca, MD 11/10/17 [phone]

## 2017-11-10 NOTE — ED Notes (Signed)

## 2017-11-14 ENCOUNTER — Telehealth: Payer: Self-pay | Admitting: Internal Medicine

## 2017-11-14 NOTE — Telephone Encounter (Signed)
Called patient to schedule appointment with Dr. Olivia Mackie.  Patient can be placed in any 30 minute slot.  [facility] may schedule patient if he return call back.

## 2017-11-14 NOTE — Telephone Encounter (Signed)
Copied from [place]. Topic: General - Other >> Nov 14, 2017  9:31 AM Carolyn Stare wrote:  Pt wife call to say pt has a deep chest cough and maybe a upper resp infection. Wife said he is having surgery on 11/29/16 and needs to get rip of this mess , would like a call back about an appt   [phone]  [phone]

## 2017-11-16 ENCOUNTER — Encounter
Admission: RE | Admit: 2017-11-16 | Discharge: 2017-11-16 | Disposition: A | Discharge status: Home / Self Care | Payer: Medicare Other | Source: Ambulatory Visit | Attending: Orthopedic Surgery | Admitting: Orthopedic Surgery

## 2017-11-16 ENCOUNTER — Other Ambulatory Visit: Payer: Self-pay

## 2017-11-16 ENCOUNTER — Ambulatory Visit
Admission: RE | Admit: 2017-11-16 | Discharge: 2017-11-16 | Disposition: A | Discharge status: Home / Self Care | Payer: Medicare Other | Source: Ambulatory Visit | Attending: Gastroenterology | Admitting: Gastroenterology

## 2017-11-16 DIAGNOSIS — N210 Calculus in bladder: Secondary | ICD-10-CM | POA: Diagnosis not present

## 2017-11-16 DIAGNOSIS — Z01818 Encounter for other preprocedural examination: Secondary | ICD-10-CM | POA: Diagnosis not present

## 2017-11-16 DIAGNOSIS — K669 Disorder of peritoneum, unspecified: Secondary | ICD-10-CM | POA: Insufficient documentation

## 2017-11-16 DIAGNOSIS — N200 Calculus of kidney: Secondary | ICD-10-CM | POA: Insufficient documentation

## 2017-11-16 DIAGNOSIS — R197 Diarrhea, unspecified: Secondary | ICD-10-CM | POA: Diagnosis not present

## 2017-11-16 DIAGNOSIS — Z01812 Encounter for preprocedural laboratory examination: Secondary | ICD-10-CM | POA: Insufficient documentation

## 2017-11-16 DIAGNOSIS — K760 Fatty (change of) liver, not elsewhere classified: Secondary | ICD-10-CM | POA: Insufficient documentation

## 2017-11-16 DIAGNOSIS — N400 Enlarged prostate without lower urinary tract symptoms: Secondary | ICD-10-CM | POA: Insufficient documentation

## 2017-11-16 DIAGNOSIS — R933 Abnormal findings on diagnostic imaging of other parts of digestive tract: Secondary | ICD-10-CM | POA: Diagnosis not present

## 2017-11-16 DIAGNOSIS — Z0183 Encounter for blood typing: Secondary | ICD-10-CM | POA: Diagnosis not present

## 2017-11-16 HISTORY — DX: Personal history of urinary calculi: Z87.442

## 2017-11-16 HISTORY — DX: Sleep apnea, unspecified: G47.30

## 2017-11-16 HISTORY — DX: Crohn's disease, unspecified, without complications: K50.90

## 2017-11-16 LAB — BASIC METABOLIC PANEL
ANION GAP: 14 (ref 5–15)
BUN: 16 mg/dL (ref 6–20)
CHLORIDE: 102 mmol/L (ref 101–111)
CO2: 23 mmol/L (ref 22–32)
Calcium: 9.9 mg/dL (ref 8.9–10.3)
Creatinine, Ser: 0.91 mg/dL (ref 0.61–1.24)
Glucose, Bld: 175 mg/dL — ABNORMAL HIGH (ref 65–99)
POTASSIUM: 3.8 mmol/L (ref 3.5–5.1)
SODIUM: 139 mmol/L (ref 135–145)

## 2017-11-16 LAB — URINALYSIS, ROUTINE W REFLEX MICROSCOPIC
BILIRUBIN URINE: NEGATIVE
Glucose, UA: NEGATIVE mg/dL
HGB URINE DIPSTICK: NEGATIVE
KETONES UR: NEGATIVE mg/dL
Leukocytes, UA: NEGATIVE
NITRITE: NEGATIVE
PROTEIN: NEGATIVE mg/dL
SPECIFIC GRAVITY, URINE: 1.023 (ref 1.005–1.030)
pH: 5 (ref 5.0–8.0)

## 2017-11-16 LAB — TYPE AND SCREEN
ABO/RH(D): A POS
Antibody Screen: NEGATIVE

## 2017-11-16 LAB — CBC
HCT: 40.4 % (ref 40.0–52.0)
HEMOGLOBIN: 13.9 g/dL (ref 13.0–18.0)
MCH: 34.6 pg — ABNORMAL HIGH (ref 26.0–34.0)
MCHC: 34.5 g/dL (ref 32.0–36.0)
MCV: 100.5 fL — ABNORMAL HIGH (ref 80.0–100.0)
Platelets: 260 10*3/uL (ref 150–440)
RBC: 4.01 MIL/uL — AB (ref 4.40–5.90)
RDW: 14 % (ref 11.5–14.5)
WBC: 7.8 10*3/uL (ref 3.8–10.6)

## 2017-11-16 LAB — PROTIME-INR
INR: 1.42
PROTHROMBIN TIME: 17.2 s — AB (ref 11.4–15.2)

## 2017-11-16 LAB — APTT: APTT: 33 s (ref 24–36)

## 2017-11-16 LAB — SURGICAL PCR SCREEN
MRSA, PCR: NEGATIVE
STAPHYLOCOCCUS AUREUS: NEGATIVE

## 2017-11-16 LAB — SEDIMENTATION RATE: Sed Rate: 49 mm/hr — ABNORMAL HIGH (ref 0–20)

## 2017-11-16 MED ORDER — IOPAMIDOL (ISOVUE-300) INJECTION 61%
100.0000 mL | Freq: Once | INTRAVENOUS | Status: AC | PRN
  Administered 2017-11-16: 100 mL via INTRAVENOUS

## 2017-11-16 NOTE — Telephone Encounter (Signed)
Pt has an appt on 3.8.19 @ 9:30am

## 2017-11-16 NOTE — Patient Instructions (Addendum)
Your procedure is scheduled on: Tuesday November 29, 2017  To find out your arrival time, please call [phone] between 1PM - 3PM on Monday November 28, 2017  Report to [place]: Instructions that are not followed completely may result in serious medical risk, up to and including death; or upon the discretion of your surgeon and anesthesiologist your surgery may need to be rescheduled.  Do not eat food after midnight the night before your procedure.  No gum chewing, lozengers or hard candies.  You may however, drink water up to 2 hours before you are scheduled to arrive at the hospital for your procedure. Do not drink water within 2 hours of the start of your surgery.  No Alcohol for 24 hours before or after surgery.  No Smoking including e-cigarettes for 24 hours prior to surgery. No chewable      tobacco products for at least 6 hours prior to surgery.        No nicotine patches on the day of surgery.  On the morning of surgery brush your teeth with toothpaste and water,     You may rinse your mouth with mouthwash if you wish.         Do not swallow any toothpaste of mouthwash.  Notify your doctor if there is any change in your medical condition              (cold, fever, infection).  Do not wear jewelry, make-up, hairpins, clips or nail polish.  Do not wear lotions, powders, or perfumes. You may wear deodorant.   Do not use any creams after using the special surgical soap scrub.  Do not shave 48 hours prior to surgery. Men may shave face and neck.  Contacts and dentures may not be worn into surgery.  Do not bring valuables to the hospital. [facility] is not responsible for any belongings or valuables.  This includes license, insurance card or credit cards.  TAKE THESE MEDICATIONS THE MORNING OF SURGERY:  1.  AMLODIPINE 2.  CARVEDILOL 3.  GABAPENTIN 4.  HYDRALAZINE 5.  ISOSORBIDE MONONITRATE 6.  OMEPRAZOLE  Use CHG Soap as directed  on instruction sheet.  Stop Metformin 2 days prior to surgery. LAST DAY TO TAKE IS Saturday, MARCH 16.  Take 1/2 of usual insulin dose the night before surgery and        none on the morning of surgery. ONLY TAKE 45 UNITS OF LEMEMIR THE NIGHT BEFORE SURGERY.  Follow recommendations from Cardiologist, Pulmonologist or PCP regarding stopping         Aspirin, Coumadin AND Plavix.  HOLD COUMADIN 5 DAYS PRIOR TO SURGERY AND RESTART THE DAY AFTER. (NO COUMADIN ON MARCH 14 THROUGH MARCH 19; RESTART ON MARCH 20 WITH USUAL DOSE.  HOLD PLAVIX 7 DAYS PRIOR TO SURGERY AND RESTART 2 DAYS AFTER. (NO PLAVIX ON MARCH 12 THROUGH MARCH 20; RESTART ON MARCH 21.    START ASPIRIN 81 MG ON MARCH 12 AND CONTINUE ONCE A DAY ASPIRIN UNTIL RESTARTING THE PLAVIX; SO NO MORE ASPIRIN STARTING ON MARCH 21.            Stop Aspirin based products such as Excedrin / Goodys Powder / BC Powder  ON MARCH 12 - Stop Anti-inflammatories such as Advil, Aleve, Ibuprofen, Motrin, VOLTAREN GEL, MESALAMINE (May take Tylenol or Acetaminophen if needed.)  ON MARCH 12 - Stop ANY OVER THE COUNTER supplements (VITAMIN C AND VITAMIN E) until after surgery. (  May continue     Vitamin D, Vitamin B, and multivitamin.)   If you are being admitted to the hospital overnight, leave your suitcase in the car.      After surgery it may be brought to your room.  If you are taking public transportation, you will need to have a responsible adult with you.    Please confirm with your physician that it is acceptable to use public transportation.   Please call [phone] if you have any questions about these instructions.  Wear comfortable clothing (specific to your surgery type) to the hospital.

## 2017-11-17 LAB — URINE CULTURE: Culture: NO GROWTH

## 2017-11-18 ENCOUNTER — Ambulatory Visit (INDEPENDENT_AMBULATORY_CARE_PROVIDER_SITE_OTHER): Payer: Medicare Other | Admitting: Internal Medicine

## 2017-11-18 ENCOUNTER — Encounter: Payer: Self-pay | Admitting: Internal Medicine

## 2017-11-18 VITALS — BP 126/50 | HR 63 | Temp 98.0°F | Ht 68.0 in | Wt 197.2 lb

## 2017-11-18 DIAGNOSIS — K639 Disease of intestine, unspecified: Secondary | ICD-10-CM | POA: Diagnosis not present

## 2017-11-18 DIAGNOSIS — J069 Acute upper respiratory infection, unspecified: Secondary | ICD-10-CM | POA: Diagnosis not present

## 2017-11-18 DIAGNOSIS — K6389 Other specified diseases of intestine: Secondary | ICD-10-CM | POA: Insufficient documentation

## 2017-11-18 MED ORDER — DOXYCYCLINE HYCLATE 100 MG PO TABS: 100.0000 mg | ORAL_TABLET | Freq: Two times a day (BID) | ORAL | 0 refills | Status: DC

## 2017-11-18 NOTE — Progress Notes (Signed)
Chief Complaint  Patient presents with  . Cough   Follow up wife wife  1. Cough with yellow phelgm x 1 week and wheezing no sob, fever, myalgias, throat was sore but not any longer, have worse sx's at night. Wife tried to offer albuterol tx at home but pt stated she was not a doctor  2. Reviewed CT entero + colon mass and lymph node and rec PET/CT will disc with GI Laurine Blazer as also she referred him to surgery wife states. Pt just had colonoscopy 04/2017 normal with multiple bxs.    Review of Systems  Constitutional: Negative for fever and weight loss.  HENT: Negative for sore throat.   Eyes: Negative for blurred vision.  Respiratory: Positive for cough, sputum production and wheezing. Negative for shortness of breath.   Cardiovascular: Negative for chest pain.  Gastrointestinal: Positive for diarrhea.  Musculoskeletal: Negative for myalgias.  Skin: Negative for rash.   Past Medical History:  Diagnosis Date  . Arthritis   . Atrial fibrillation ([place])   . CAD (coronary artery disease)    6 STENTS  . CHF (congestive heart failure) ([place])   . Chicken pox   . Corneal dystrophy    bilateral  . Crohn's disease ([place])   . Diabetes ([place])   . Dysrhythmia    A-FIB AND PAF  . GERD (gastroesophageal reflux disease)   . Gout   . History of kidney stones   . History of shingles   . Hyperlipemia   . Hypertension   . Kidney stones   . Myocardial infarction ([facility])    x4  . Peripheral neuropathy   . Peripheral neuropathy   . Peripheral neuropathy   . PUD (peptic ulcer disease)   . Renal artery stenosis ([place])   . Renal artery stenosis ([place])   . Salzmann's nodular dystrophy 2012  . Sleep apnea   . Spinal stenosis    Past Surgical History:  Procedure Laterality Date  . APPLICATION VERTERBRAL DEFECT PROSTHETIC  05/01/2013  . ARTHRODESIS ANTERIOR LUMBAR SPINE  05/01/2013  . BACK SURGERY     lumbar fusion  . CARDIAC CATHETERIZATION    . CARDIAC ELECTROPHYSIOLOGY STUDY AND ABLATION     . CARDIAC SURGERY    . CARDIOVERSION    . CHOLECYSTECTOMY    . COLONOSCOPY WITH PROPOFOL N/A 04/09/2016   Procedure: COLONOSCOPY WITH PROPOFOL;  Surgeon: Lollie Sails, MD;  Location: [facility];  Service: Endoscopy;  Laterality: N/A;  . CORNEAL EYE SURGERY Bilateral 07/28/11    09/22/2011  . CORONARY ANGIOPLASTY    . CORONARY ANGIOPLASTY WITH STENT PLACEMENT  03/28/2015   Distal 80% to normal Stent, Dilation Balloon  . CORONARY ARTERY BYPASS GRAFT     4 VESSELS  . EYE SURGERY     bilateral cataract  . foot and ankle repair Right 2005  . FRACTURE SURGERY     ANKLE PLATE AND SCREWS  . GALLBLADER    . JOINT REPLACEMENT     left total hip 11/11/15  . KNEE ARTHROSCOPY Right   . LUMBAR SPINE FUSION ONE LEVEL  05/03/2013  . RENAL ARTERY STENT Right   . ROTATOR CUFF REPAIR Right 2006  . TONSILLECTOMY    . TOTAL HIP ARTHROPLASTY Left 11/11/2015   Procedure: TOTAL HIP ARTHROPLASTY ANTERIOR APPROACH;  Surgeon: Hessie Knows, MD;  Location: [facility] ORS;  Service: Orthopedics;  Laterality: Left;  . TRANSCATH PLACEMENT INTRAVASCULAR STENT LEG  03/2015   Family History  Problem Relation Age of Onset  .  CVA Father   . Stroke Father   . Lung cancer Mother   . Arthritis Mother   . Stomach cancer Sister   . Colon cancer Sister   . Diabetes Brother    Social History   Socioeconomic History  . Marital status: Married    Spouse name: Not on file  . Number of children: Not on file  . Years of education: Not on file  . Highest education level: Not on file  Social Needs  . Financial resource strain: Not on file  . Food insecurity - worry: Not on file  . Food insecurity - inability: Not on file  . Transportation needs - medical: Not on file  . Transportation needs - non-medical: Not on file  Occupational History  . Not on file  Tobacco Use  . Smoking status: Former Smoker    Packs/day: 1.00    Years: 3.00    Pack years: 3.00    Types: Cigarettes    Last attempt to quit: 06/30/1962     Years since quitting: 55.4  . Smokeless tobacco: Former Systems developer    Types: [place] date: 12/05/1968  Substance and Sexual Activity  . Alcohol use: No  . Drug use: No  . Sexual activity: Not on file  Other Topics Concern  . Not on file  Social History Narrative   Married    No outpatient medications have been marked as taking for the 11/18/17 encounter (Office Visit) with McLean-Scocuzza, Nino Glow, MD.   Allergies  Allergen Reactions  . Allopurinol Diarrhea, Other (See Comments) and Nausea And Vomiting    Other Reaction: GI Upset  . Atenolol Other (See Comments)    Other Reaction: bradycardia  . Atorvastatin Other (See Comments)    Other Reaction: muscle aches  . Ramipril Rash  . Rosuvastatin Other (See Comments)    Muscle aches  . Simvastatin Other (See Comments)    Other Reaction: MUSCLE ACHES (Zocor)  . Valsartan Other (See Comments)    Other Reaction: facial swelling  . Cardizem [Diltiazem] Rash   Recent Results (from the past 2160 hour(s))  Urinalysis, Complete     Status: None   Collection Time: 09/26/17  1:25 PM  Result Value Ref Range   Specific Gravity, UA 1.025 1.005 - 1.030   pH, UA 5.0 5.0 - 7.5   Color, UA Yellow Yellow   Appearance Ur Clear Clear   Leukocytes, UA Negative Negative   Protein, UA Negative Negative/Trace   Glucose, UA Negative Negative   Ketones, UA Negative Negative   RBC, UA Negative Negative   Bilirubin, UA Negative Negative   Urobilinogen, Ur 0.2 0.2 - 1.0 mg/dL   Nitrite, UA Negative Negative  Hepatitis B surface antibody     Status: Abnormal   Collection Time: 10/03/17  4:56 PM  Result Value Ref Range   Hepatitis B-Post <5 (L) > OR = 10 mIU/mL    Comment: . Patient does not have immunity to hepatitis B virus. . For additional information, please refer to http://education.questdiagnostics.com/faq/FAQ105 (This link is being provided for informational/ educational purposes only).   Hepatitis B surface antigen     Status: None    Collection Time: 10/03/17  4:56 PM  Result Value Ref Range   Hepatitis B Surface Ag NON-REACTIVE NON-REACTI  Hepatitis C antibody     Status: None   Collection Time: 10/03/17  4:56 PM  Result Value Ref Range   Hepatitis C Ab NON-REACTIVE NON-REACTI   SIGNAL  TO CUT-OFF 0.03 <1.00  CBC with Differential/Platelet     Status: Abnormal   Collection Time: 10/03/17  4:56 PM  Result Value Ref Range   WBC 8.1 3.8 - 10.8 Thousand/uL   RBC 3.86 (L) 4.20 - 5.80 Million/uL   Hemoglobin 13.3 13.2 - 17.1 g/dL   HCT 37.1 (L) 38.5 - 50.0 %   MCV 96.1 80.0 - 100.0 fL   MCH 34.5 (H) 27.0 - 33.0 pg   MCHC 35.8 32.0 - 36.0 g/dL   RDW 12.8 11.0 - 15.0 %   Platelets 235 140 - 400 Thousand/uL   MPV 10.6 7.5 - 12.5 fL   Neutro Abs 4,925 1,500 - 7,800 cells/uL   Lymphs Abs 1,960 850 - 3,900 cells/uL   WBC mixed population 778 200 - 950 cells/uL   Eosinophils Absolute 397 15 - 500 cells/uL   Basophils Absolute 41 0 - 200 cells/uL   Neutrophils Relative % 60.8 %   Total Lymphocyte 24.2 %   Monocytes Relative 9.6 %   Eosinophils Relative 4.9 %   Basophils Relative 0.5 %  Comprehensive metabolic panel     Status: Abnormal   Collection Time: 10/03/17  4:56 PM  Result Value Ref Range   Glucose, Bld 103 (H) 65 - 99 mg/dL    Comment: .            Fasting reference interval . For someone without known diabetes, a glucose value between 100 and 125 mg/dL is consistent with prediabetes and should be confirmed with a follow-up test. .    BUN 14 7 - 25 mg/dL   Creat 0.95 0.70 - 1.18 mg/dL    Comment: For patients >52 years of age, the reference limit for Creatinine is approximately 13% higher for people identified as African-American. .    BUN/Creatinine Ratio NOT APPLICABLE 6 - 22 (calc)   Sodium 138 135 - 146 mmol/L   Potassium 4.2 3.5 - 5.3 mmol/L   Chloride 100 98 - 110 mmol/L   CO2 24 20 - 32 mmol/L   Calcium 9.3 8.6 - 10.3 mg/dL   Total Protein 7.4 6.1 - 8.1 g/dL   Albumin 4.1 3.6 - 5.1  g/dL   Globulin 3.3 1.9 - 3.7 g/dL (calc)   AG Ratio 1.2 1.0 - 2.5 (calc)   Total Bilirubin 0.5 0.2 - 1.2 mg/dL   Alkaline phosphatase (APISO) 77 40 - 115 U/L   AST 18 10 - 35 U/L   ALT 20 9 - 46 U/L  Hemoglobin A1C     Status: Abnormal   Collection Time: 10/03/17  4:56 PM  Result Value Ref Range   Hgb A1c MFr Bld 6.5 (H) <5.7 % of total Hgb    Comment: For someone without known diabetes, a hemoglobin A1c value of 6.5% or greater indicates that they may have  diabetes and this should be confirmed with a follow-up  test. . For someone with known diabetes, a value <7% indicates  that their diabetes is well controlled and a value  greater than or equal to 7% indicates suboptimal  control. A1c targets should be individualized based on  duration of diabetes, age, comorbid conditions, and  other considerations. . Currently, no consensus exists regarding use of hemoglobin A1c for diagnosis of diabetes for children. .    Mean Plasma Glucose 140 (calc)   eAG (mmol/L) 7.7 (calc)  INR/PT     Status: None   Collection Time: 10/03/17  4:56 PM  Result Value Ref Range  INR      Comment:   TSH     Status: None   Collection Time: 10/03/17  4:56 PM  Result Value Ref Range   TSH 3.54 0.40 - 4.50 mIU/L  VITAMIN D 25 Hydroxy (Vit-D Deficiency, Fractures)     Status: Abnormal   Collection Time: 10/03/17  4:56 PM  Result Value Ref Range   Vit D, 25-Hydroxy 21 (L) 30 - 100 ng/mL    Comment: Vitamin D Status         25-OH Vitamin D: . Deficiency:                    <20 ng/mL Insufficiency:             20 - 29 ng/mL Optimal:                 > or = 30 ng/mL . For 25-OH Vitamin D testing on patients on  D2-supplementation and patients for whom quantitation  of D2 and D3 fractions is required, the QuestAssureD(TM) 25-OH VIT D, (D2,D3), LC/MS/MS is recommended: order  code [phone] (patients >5yr). . For more information on this test, go  to: http://education.questdiagnostics.com/faq/FAQ163 (This link is being provided for  informational/educational purposes only.)   PSA     Status: None   Collection Time: 10/03/17  4:56 PM  Result Value Ref Range   PSA 0.5 < OR = 4.0 ng/mL    Comment: The total PSA value from this assay system is  standardized against the WHO standard. The test  result will be approximately 20% lower when compared  to the equimolar-standardized total PSA (Beckman  Coulter). Comparison of serial PSA results should be  interpreted with this fact in mind. . This test was performed using the Siemens  chemiluminescent method. Values obtained from  different assay methods cannot be used interchangeably. PSA levels, regardless of value, should not be interpreted as absolute evidence of the presence or absence of disease.   INR/PT     Status: Abnormal   Collection Time: 10/10/17  3:02 PM  Result Value Ref Range   INR 1.9 (H) 0.8 - 1.0 ratio   Prothrombin Time 20.7 (H) 9.6 - 13.1 sec  CBC     Status: Abnormal   Collection Time: 10/12/17  7:09 PM  Result Value Ref Range   WBC 10.8 (H) 3.8 - 10.6 K/uL   RBC 4.25 (L) 4.40 - 5.90 MIL/uL   Hemoglobin 14.7 13.0 - 18.0 g/dL   HCT 42.2 40.0 - 52.0 %   MCV 99.2 80.0 - 100.0 fL   MCH 34.5 (H) 26.0 - 34.0 pg   MCHC 34.8 32.0 - 36.0 g/dL   RDW 14.0 11.5 - 14.5 %   Platelets 241 150 - 440 K/uL    Comment: Performed at [facility] [address], [place] [address] Comprehensive metabolic panel     Status: Abnormal   Collection Time: 10/12/17  7:09 PM  Result Value Ref Range   Sodium 137 135 - 145 mmol/L   Potassium 4.1 3.5 - 5.1 mmol/L   Chloride 102 101 - 111 mmol/L   CO2 21 (L) 22 - 32 mmol/L   Glucose, Bld 192 (H) 65 - 99 mg/dL   BUN 24 (H) 6 - 20 mg/dL   Creatinine, Ser 1.00 0.61 - 1.24 mg/dL   Calcium 8.7 (L) 8.9 - 10.3 mg/dL   Total Protein 8.0 6.5 - 8.1 g/dL   Albumin 3.9 3.5 -  5.0 g/dL   AST 36 15 - 41 U/L   ALT 26 17 -  63 U/L   Alkaline Phosphatase 78 38 - 126 U/L   Total Bilirubin 1.1 0.3 - 1.2 mg/dL   GFR calc non Af Amer >60 >60 mL/min   GFR calc Af Amer >60 >60 mL/min    Comment: (NOTE) The eGFR has been calculated using the CKD EPI equation. This calculation has not been validated in all clinical situations. eGFR's persistently <60 mL/min signify possible Chronic Kidney Disease.    Anion gap 14 5 - 15    Comment: Performed at [facility], [place], [address] [place], [place] [address]  Troponin I     Status: None   Collection Time: 10/12/17  7:09 PM  Result Value Ref Range   Troponin I <0.03 <0.03 ng/mL    Comment: Performed at [facility], [address] [place], [address] [address]  Urinalysis, Complete w Microscopic     Status: Abnormal   Collection Time: 10/12/17  7:09 PM  Result Value Ref Range   Color, Urine YELLOW (A) YELLOW   APPearance CLEAR (A) CLEAR   Specific Gravity, Urine 1.021 1.005 - 1.030   pH 5.0 5.0 - 8.0   Glucose, UA NEGATIVE NEGATIVE mg/dL   Hgb urine dipstick NEGATIVE NEGATIVE   Bilirubin Urine NEGATIVE NEGATIVE   Ketones, ur NEGATIVE NEGATIVE mg/dL   Protein, ur NEGATIVE NEGATIVE mg/dL   Nitrite NEGATIVE NEGATIVE   Leukocytes, UA TRACE (A) NEGATIVE   RBC / HPF 0-5 0 - 5 RBC/hpf   WBC, UA 6-30 0 - 5 WBC/hpf   Bacteria, UA NONE SEEN NONE SEEN   Squamous Epithelial / LPF 0-5 (A) NONE SEEN    Comment: Performed at [facility], [address], [place], [place] [address]  Influenza panel by PCR (type A & B)     Status: None   Collection Time: 10/12/17  7:09 PM  Result Value Ref Range   Influenza A By PCR NEGATIVE NEGATIVE   Influenza B By PCR NEGATIVE NEGATIVE    Comment: (NOTE) The Xpert Xpress Flu assay is intended as an aid in the diagnosis of  influenza and should not be used as a sole basis for treatment.  This  assay is FDA approved for nasopharyngeal swab specimens only. Nasal  washings and aspirates are unacceptable for Xpert  Xpress Flu testing. Performed at [facility], [address] [place], [place] [address]   Urine Culture     Status: Abnormal   Collection Time: 10/12/17  7:09 PM  Result Value Ref Range   Specimen Description      URINE, RANDOM Performed at [facility], [address] [place], [place] [address]    Special Requests      NONE Performed at [facility], [address] [place],  [address]    Culture MULTIPLE SPECIES PRESENT, SUGGEST RECOLLECTION (A)    Report Status 10/14/2017 FINAL   Protime-INR     Status: Abnormal   Collection Time: 10/12/17 10:15 PM  Result Value Ref Range   Prothrombin Time 20.7 (H) 11.4 - 15.2 seconds   INR 1.80     Comment: Performed at [facility], [address] [place],  [address]  INR/PT     Status: Abnormal   Collection Time: 10/18/17  2:52 PM  Result Value Ref Range   INR 1.9 (H) 0.8 - 1.0 ratio   Prothrombin Time 20.9 (H) 9.6 -  13.1 sec  INR/PT     Status: Abnormal   Collection Time: 10/27/17  2:29 PM  Result Value Ref Range   INR 2.1 (H) 0.8 - 1.0 ratio   Prothrombin Time 22.7 (H) 9.6 - 13.1 sec  POCT Glucose (CBG)     Status: Abnormal   Collection Time: 10/27/17  3:17 PM  Result Value Ref Range   POC Glucose 241 (A) 70 - 99 mg/dl  Bladder Scan (Post Void Residual) in office     Status: None   Collection Time: 11/08/17  1:46 PM  Result Value Ref Range   Scan Result 23   Protime-INR     Status: Abnormal   Collection Time: 11/10/17  7:48 AM  Result Value Ref Range   Prothrombin Time 23.0 (H) 11.4 - 15.2 seconds   INR 2.06     Comment: Performed at [facility], [address] [place], [place] [address]  APTT     Status: Abnormal   Collection Time: 11/10/17  7:48 AM  Result Value Ref Range   aPTT 37 (H) 24 - 36 seconds    Comment:        IF BASELINE aPTT IS ELEVATED, SUGGEST PATIENT RISK ASSESSMENT BE USED TO DETERMINE APPROPRIATE ANTICOAGULANT THERAPY. Performed at  [facility], [address] [place], [place] [address]   CBC     Status: Abnormal   Collection Time: 11/16/17  9:05 AM  Result Value Ref Range   WBC 7.8 3.8 - 10.6 K/uL   RBC 4.01 (L) 4.40 - 5.90 MIL/uL   Hemoglobin 13.9 13.0 - 18.0 g/dL   HCT 40.4 40.0 - 52.0 %   MCV 100.5 (H) 80.0 - 100.0 fL   MCH 34.6 (H) 26.0 - 34.0 pg   MCHC 34.5 32.0 - 36.0 g/dL   RDW 14.0 11.5 - 14.5 %   Platelets 260 150 - 440 K/uL    Comment: Performed at [facility], [address] [place], [place] [address]  Sedimentation rate     Status: Abnormal   Collection Time: 11/16/17  9:05 AM  Result Value Ref Range   Sed Rate 49 (H) 0 - 20 mm/hr    Comment: Performed at [facility], [address] [place], [place] [address]  Basic metabolic panel     Status: Abnormal   Collection Time: 11/16/17  9:05 AM  Result Value Ref Range   Sodium 139 135 - 145 mmol/L   Potassium 3.8 3.5 - 5.1 mmol/L   Chloride 102 101 - 111 mmol/L   CO2 23 22 - 32 mmol/L   Glucose, Bld 175 (H) 65 - 99 mg/dL   BUN 16 6 - 20 mg/dL   Creatinine, Ser 0.91 0.61 - 1.24 mg/dL   Calcium 9.9 8.9 - 10.3 mg/dL   GFR calc non Af Amer >60 >60 mL/min   GFR calc Af Amer >60 >60 mL/min    Comment: (NOTE) The eGFR has been calculated using the CKD EPI equation. This calculation has not been validated in all clinical situations. eGFR's persistently <60 mL/min signify possible Chronic Kidney Disease.    Anion gap 14 5 - 15    Comment: Performed at [facility], [address] [place], [place] [address]  APTT     Status: None   Collection Time: 11/16/17  9:05 AM  Result Value Ref Range   aPTT 33 24 - 36 seconds    Comment: Performed at [facility], [address] [place], [place] [address]  Protime-INR     Status: Abnormal   Collection Time: 11/16/17  9:05 AM  Result Value Ref Range   Prothrombin Time 17.2 (H) 11.4 - 15.2 seconds   INR 1.42     Comment: Performed at [facility], [address] [place], [place] [address]  Urinalysis, Routine w reflex microscopic     Status: Abnormal   Collection Time: 11/16/17  9:05 AM  Result Value Ref Range   Color, Urine AMBER (A) YELLOW    Comment: BIOCHEMICALS MAY BE AFFECTED BY COLOR   APPearance HAZY (A) CLEAR   Specific Gravity, Urine 1.023 1.005 - 1.030   pH 5.0 5.0 - 8.0   Glucose, UA NEGATIVE NEGATIVE mg/dL   Hgb urine dipstick NEGATIVE NEGATIVE   Bilirubin Urine NEGATIVE NEGATIVE   Ketones, ur NEGATIVE NEGATIVE mg/dL   Protein, ur NEGATIVE NEGATIVE mg/dL   Nitrite NEGATIVE NEGATIVE   Leukocytes, UA NEGATIVE NEGATIVE    Comment: Performed at [facility], [address], [place], [place] [address]  Urine culture     Status: None   Collection Time: 11/16/17  9:05 AM  Result Value Ref Range   Specimen Description      URINE, RANDOM Performed at [facility], [address], [place], [place] [address]    Special Requests      NONE Performed at [facility], [address], [place], [place] [address]    Culture      NO GROWTH Performed at [facility], [place] [address] [place], [place] [address]    Report Status 11/17/2017 FINAL   Surgical pcr screen     Status: None   Collection Time: 11/16/17  9:05 AM  Result Value Ref Range   MRSA, PCR NEGATIVE NEGATIVE   Staphylococcus aureus NEGATIVE NEGATIVE    Comment: (NOTE) The Xpert SA Assay (FDA approved for NASAL specimens in patients 75 years of age and older), is one component of a comprehensive surveillance program. It is not intended to diagnose infection nor to guide or monitor treatment. Performed at [facility], [address] [place], [place] [address]   Type and screen [place]     Status: None   Collection Time: 11/16/17  9:05 AM  Result Value Ref Range   ABO/RH(D) A POS    Antibody Screen NEG    Sample Expiration 11/30/2017    Extend sample reason      NO  TRANSFUSIONS OR PREGNANCY IN THE PAST 3 MONTHS Performed at [facility], [address] [place], [place] [address]    Objective  Body mass index is 29.98 kg/m. Wt Readings from Last 3 Encounters:  11/18/17 197 lb 3.2 oz (89.4 kg)  11/16/17 194 lb (88 kg)  11/10/17 194 lb (88 kg)   Temp Readings from Last 3 Encounters:  11/18/17 98 F (36.7 C) (Oral)  11/10/17 (!) 97.5 F (36.4 C) (Oral)  10/18/17 98.1 F (36.7 C) (Oral)   BP Readings from Last 3 Encounters:  11/18/17 (!) 126/50  11/16/17 (!) 147/64  11/10/17 125/63   Pulse Readings from Last 3 Encounters:  11/18/17 63  11/16/17 60  11/10/17 (!) 58   O2 sat room air  Physical Exam  Constitutional: He is oriented to person, place, and time and well-developed, well-nourished, and in no distress. Vital signs are normal.  HENT:  Head: Normocephalic and atraumatic.  Mouth/Throat: Oropharynx is clear and moist and mucous  membranes are normal.  Eyes: Conjunctivae are normal. Pupils are equal, round, and reactive to light.  Cardiovascular: Normal rate, regular rhythm and normal heart sounds.  Pulmonary/Chest: Effort normal and breath sounds normal. He has no wheezes.  Neurological: He is alert and oriented to person, place, and time. Gait normal.  Skin: Skin is warm, dry and intact.  Psychiatric: Mood, memory, affect and judgment normal.  Nursing note and vitals reviewed.   Assessment   1. Cough ddx URI, pna though O2 sat normal, former smoker w/o h/o COPD 2. Colon mass with 1 cm lymph node  3. HM Plan  1. Trial of doxycycline 100 mg bid x 1 week  Mucinex/robitussin DM  2. Reach out to GI to see plan of care surgery, PET/CT given copy and reviewed CT entero today with pt and wife rec pt hold ortho procedure for now sch 11/29/17 3.  Had flu shot  pna 21 had 02/2014  Given prevnar today  Disc Tdap and shingrix in future   PSA had nl 10/03/17  0.5 consider DRE in future Colonoscopy had 04/2017 [facility] h/o  crohns on mesalamine 2.4 mg qd-negative colonoscopy with multiple bxs  Consider repeat DEXA reviewed DEXA +osteoporosis noted 06/07/04  Consider repeat CT chest h/o pulm nodules on CXR and CT chest 06/24/15 and former smoker  Refer to Derm Dr. Kellie Moor tbse c/w left temple and right hand Aks. H/o skin cancer  -wife changed appt to [facility] dermatologist due to availability   Of note reviewed meds last visit pt not taking Flomax, Detrol, Toviaz 4 mg qd, Pravastatin  Consider check lipid fasting in future unable to tolerate pravastatin consider zetia in future  Will ask pt at f/u if has sleep apnea as had prior sleep study unable to view    Provider: Dr. Olivia Mackie McLean-Scocuzza-Internal Medicine

## 2017-11-18 NOTE — Patient Instructions (Signed)
Call back Monday to let me know how you are  Follow up in 1 week if needed otherwise as scheduled  Cough, Adult Coughing is a reflex that clears your throat and your airways. Coughing helps to heal and protect your lungs. It is normal to cough occasionally, but a cough that happens with other symptoms or lasts a long time may be a sign of a condition that needs treatment. A cough may last only 2-3 weeks (acute), or it may last longer than 8 weeks (chronic). What are the causes? Coughing is commonly caused by:  Breathing in substances that irritate your lungs.  A viral or bacterial respiratory infection.  Allergies.  Asthma.  Postnasal drip.  Smoking.  Acid backing up from the stomach into the esophagus (gastroesophageal reflux).  Certain medicines.  Chronic lung problems, including COPD (or rarely, lung cancer).  Other medical conditions such as heart failure.  Follow these instructions at home: Pay attention to any changes in your symptoms. Take these actions to help with your discomfort:  Take medicines only as told by your health care provider. ? If you were prescribed an antibiotic medicine, take it as told by your health care provider. Do not stop taking the antibiotic even if you start to feel better. ? Talk with your health care provider before you take a cough suppressant medicine.  Drink enough fluid to keep your urine clear or pale yellow.  If the air is dry, use a cold steam vaporizer or humidifier in your bedroom or your home to help loosen secretions.  Avoid anything that causes you to cough at work or at home.  If your cough is worse at night, try sleeping in a semi-upright position.  Avoid cigarette smoke. If you smoke, quit smoking. If you need help quitting, ask your health care provider.  Avoid caffeine.  Avoid alcohol.  Rest as needed.  Contact a health care provider if:  You have new symptoms.  You cough up pus.  Your cough does not get  better after 2-3 weeks, or your cough gets worse.  You cannot control your cough with suppressant medicines and you are losing sleep.  You develop pain that is getting worse or pain that is not controlled with pain medicines.  You have a fever.  You have unexplained weight loss.  You have night sweats. Get help right away if:  You cough up blood.  You have difficulty breathing.  Your heartbeat is very fast. This information is not intended to replace advice given to you by your health care provider. Make sure you discuss any questions you have with your health care provider. Document Released: 02/26/2011 Document Revised: 02/05/2016 Document Reviewed: 11/06/2014 Elsevier Interactive Patient Education  [employer].

## 2017-11-18 NOTE — Progress Notes (Signed)
Pre visit review using our clinic review tool, if applicable. No additional management support is needed unless otherwise documented below in the visit note. 

## 2017-11-21 ENCOUNTER — Telehealth: Payer: Self-pay | Admitting: Internal Medicine

## 2017-11-21 NOTE — Telephone Encounter (Signed)
Copied from [place]. Topic: Quick Communication - See Telephone Encounter >> Nov 21, 2017 12:19 PM Burnis Medin, NT wrote: CRM for notification. See Telephone encounter for: Patient's wife called and wanted to see if the Dr. Aundra Dubin could give her a call back regarding patient. Pt only wants to speak to the doctor.   11/21/17.

## 2017-11-22 ENCOUNTER — Encounter: Payer: Self-pay | Admitting: General Surgery

## 2017-11-22 ENCOUNTER — Ambulatory Visit (INDEPENDENT_AMBULATORY_CARE_PROVIDER_SITE_OTHER): Payer: Medicare Other | Admitting: General Surgery

## 2017-11-22 VITALS — BP 126/68 | HR 62 | Resp 22 | Ht 68.0 in | Wt 196.0 lb

## 2017-11-22 DIAGNOSIS — I701 Atherosclerosis of renal artery: Secondary | ICD-10-CM | POA: Diagnosis not present

## 2017-11-22 DIAGNOSIS — R1900 Intra-abdominal and pelvic swelling, mass and lump, unspecified site: Secondary | ICD-10-CM

## 2017-11-22 NOTE — Patient Instructions (Signed)
The patient is aware to call back for any questions or new concerns.

## 2017-11-22 NOTE — Progress Notes (Signed)
Approximately 45 minutes was spent reviewing patient ID: Chase Cabrera, male   DOB: 1946/07/16, 72 y.o.   MRN: [id number]  Chief Complaint  Patient presents with  . Other    HPI Chase Cabrera is a 72 y.o. male.  Here today for discussion regarding possible colon surgery referred by Dr Donnella Cabrera.He does admit to chronic diarrhea for 3 years and lower abdominal pain. The pain has been on and off for at least a year. He states at times it is left sided and others right sided. Occurring twice a week. He states the milk causes a lot of pain and diarrhea "watery and "pudding", pain relived by the diraahea. The longest the pain has lasted has been 2 hours other times it is short lived. Denies bloody BM. Bowels move 3 times day for the past few years. He postponed having right hip surgery because of this appointment. He states the diarrhea can be triggered by dairy products and OTC lactaid has helped significantly.  Colonoscopy was 04-13-17. Ct was 11-16-17. He states he fell about 6 months ago. He is here with his wife, Chase Cabrera, of 36 years. He is a retired Optometrist. Cardiologists Dr Chase Cabrera at [facility]. The patient has had no cardiac or CNS events in about 3-4 years.  Has been maintained on long-term antiplatelet therapy.  HPI  Past Medical History:  Diagnosis Date  . Arthritis   . Atrial fibrillation ([place])   . CAD (coronary artery disease)    6 STENTS  . CHF (congestive heart failure) ([place])   . Chicken pox   . Colon polyp   . Corneal dystrophy    bilateral  . Crohn's disease ([place])   . Diabetes ([place]) 2002  . Dysrhythmia    A-FIB AND PAF  . GERD (gastroesophageal reflux disease)   . Gout   . History of kidney stones   . History of shingles   . Hyperlipemia   . Hypertension   . Kidney stones   . Myocardial infarction ([place])    x4, last one in 2010  . Peripheral neuropathy   . Peripheral neuropathy   . Peripheral neuropathy   . PUD (peptic ulcer disease)   . Renal artery stenosis  ([place])   . Renal artery stenosis ([place])   . Salzmann's nodular dystrophy 2012  . Sleep apnea   . Spinal stenosis     Past Surgical History:  Procedure Laterality Date  . ABLATION  2012  . APPLICATION VERTERBRAL DEFECT PROSTHETIC  05/01/2013  . ARTHRODESIS ANTERIOR LUMBAR SPINE  05/01/2013  . BACK SURGERY     lumbar fusion  . CARDIAC CATHETERIZATION    . CARDIAC ELECTROPHYSIOLOGY STUDY AND ABLATION    . CARDIAC SURGERY    . CARDIOVERSION    . CHOLECYSTECTOMY    . COLONOSCOPY WITH PROPOFOL N/A 04/09/2016   Procedure: COLONOSCOPY WITH PROPOFOL;  Surgeon: Lollie Sails, MD;  Location: [facility] ENDOSCOPY;  Service: Endoscopy;  Laterality: N/A;  . CORNEAL EYE SURGERY Bilateral 07/28/11    09/22/2011  . CORONARY ANGIOPLASTY    . CORONARY ANGIOPLASTY WITH STENT PLACEMENT  03/28/2015   Distal 80% to normal Stent, Dilation Balloon  . CORONARY ARTERY BYPASS GRAFT     4 VESSELS  . EYE SURGERY     bilateral cataract  . foot and ankle repair Right 2005  . FRACTURE SURGERY     ANKLE PLATE AND SCREWS  . GALLBLADER    . JOINT REPLACEMENT     left  total hip 11/11/15  . KNEE ARTHROSCOPY Right   . LUMBAR SPINE FUSION ONE LEVEL  05/03/2013  . RENAL ARTERY STENT Right   . ROTATOR CUFF REPAIR Right 2006  . TONSILLECTOMY    . TOTAL HIP ARTHROPLASTY Left 11/11/2015   Procedure: TOTAL HIP ARTHROPLASTY ANTERIOR APPROACH;  Surgeon: Hessie Knows, MD;  Location: [facility] ORS;  Service: Orthopedics;  Laterality: Left;  . TRANSCATH PLACEMENT INTRAVASCULAR STENT LEG  03/2015    Family History  Problem Relation Age of Onset  . CVA Father   . Stroke Father   . Lung cancer Mother   . Arthritis Mother   . Stomach cancer Sister   . Colon cancer Sister   . Diabetes Brother     Social History Social History   Tobacco Use  . Smoking status: Former Smoker    Packs/day: 1.00    Years: 3.00    Pack years: 3.00    Types: Cigarettes    Last attempt to quit: 06/30/1962    Years since quitting: 55.4  .  Smokeless tobacco: Former Systems developer    Types: Chew    Quit date: 12/05/1968  Substance Use Topics  . Alcohol use: No  . Drug use: No    Allergies  Allergen Reactions  . Allopurinol Diarrhea, Other (See Comments) and Nausea And Vomiting    Other Reaction: GI Upset  . Atenolol Other (See Comments)    Other Reaction: bradycardia  . Atorvastatin Other (See Comments)    Other Reaction: muscle aches  . Ramipril Rash  . Rosuvastatin Other (See Comments)    Muscle aches  . Simvastatin Other (See Comments)    Other Reaction: MUSCLE ACHES (Zocor)  . Valsartan Other (See Comments)    Other Reaction: facial swelling  . Cardizem [Diltiazem] Rash    Current Outpatient Medications  Medication Sig Dispense Refill  . amLODipine (NORVASC) 10 MG tablet TAKE 1 TABLET BY MOUTH ONCE DAILY (Patient taking differently: Take 5 mg by mouth twice daily) 30 tablet 0  . azelastine (ASTELIN) 0.1 % nasal spray Place 2 sprays into both nostrils 2 (two) times daily. Use in each nostril as directed (Patient taking differently: Place 2 sprays into both nostrils daily. Use in each nostril as directed) 30 mL 1  . carvedilol (COREG) 25 MG tablet Take 1 tablet (25 mg total) by mouth 2 (two) times daily with a meal. 180 tablet 1  . Cholecalciferol 50000 units TABS Take 1 tablet by mouth once a week. 13 tablet 1  . clopidogrel (PLAVIX) 75 MG tablet Take 75 mg by mouth daily.    . colchicine 0.6 MG tablet TAKE 1 TABLET BY MOUTH ONCE DAILY 60 tablet 0  . diclofenac sodium (VOLTAREN) 1 % GEL Apply 2 g topically 4 (four) times daily as needed (pain).     Marland Kitchen dicyclomine (BENTYL) 10 MG capsule Take 10 mg by mouth 3 (three) times daily before meals.     Marland Kitchen doxycycline (VIBRA-TABS) 100 MG tablet Take 1 tablet (100 mg total) by mouth 2 (two) times daily. With food 14 tablet 0  . gabapentin (NEURONTIN) 300 MG capsule take 1 capsule by mouth three times a day (Patient taking differently: Take 600 mg by mouth 3 times daily) 90 capsule 1   . hydrALAZINE (APRESOLINE) 100 MG tablet take 1 tablet by mouth three times a day 90 tablet 5  . hydrocortisone 2.5 % cream Apply topically 2 (two) times daily. (Patient taking differently: Apply 1 application topically daily as needed (  rash behind ear). ) 30 g 0  . insulin lispro (HUMALOG) 100 UNIT/ML KiwkPen Take 15 - 30 units up to three times daily as directed    . isosorbide mononitrate (IMDUR) 120 MG 24 hr tablet TAKE 1 TABLET BY MOUTH EVERY DAY 90 tablet 1  . LEVEMIR FLEXTOUCH 100 UNIT/ML Pen Inject 90 Units into the skin daily at 10 pm.   1  . mesalamine (LIALDA) 1.2 g EC tablet Take 2.4 g by mouth daily with breakfast. [facility]    . metFORMIN (GLUCOPHAGE) 1000 MG tablet Take 1,000 mg by mouth 2 (two) times daily.   0  . Multiple Vitamin (MULTI-VITAMINS) TABS Take 1 tablet by mouth daily.    . nitroGLYCERIN (NITROSTAT) 0.4 MG SL tablet Place 1 tablet (0.4 mg total) under the tongue every 5 (five) minutes x 3 doses as needed. For chest pain.  Call 911 if no relief after 3 tablets 30 tablet 11  . omeprazole (PRILOSEC) 40 MG capsule Take 40 mg by mouth 2 (two) times daily.     Marland Kitchen oxyCODONE-acetaminophen (PERCOCET/ROXICET) 5-325 MG tablet Take 1-2 tablets by mouth as needed for severe pain.    . pravastatin (PRAVACHOL) 40 MG tablet TAKE 1 TABLET BY MOUTH ONCE DAILY 90 tablet 0  . probenecid (BENEMID) 500 MG tablet Take 500 mg by mouth 2 (two) times daily.   0  . vitamin C (ASCORBIC ACID) 500 MG tablet Take 1,000 mg by mouth daily.    . Vitamin E 400 units TABS Take 400 Units by mouth 2 (two) times daily.    Marland Kitchen warfarin (COUMADIN) 3 MG tablet 1 tablet  Monday, Wednesday, Friday. (Patient taking differently: Take 3 mg by mouth 2 (two) times a week. Sundays and Mondays) 90 tablet 3  . warfarin (COUMADIN) 4 MG tablet 1 tablet Tuesday, Thursday, Saturday, and Sunday. (Patient taking differently: Take 4 mg by mouth See admin instructions. Take 4 mg once daily Tuesday through Saturday) 90 tablet 1   No  current facility-administered medications for this visit.     Review of Systems Review of Systems  Constitutional: Negative.   Respiratory: Positive for cough.   Cardiovascular: Negative.   Gastrointestinal: Positive for abdominal pain and diarrhea. Negative for blood in stool.    Blood pressure 126/68, pulse 62, resp. rate (!) 22, height 5' 8"  (1.727 m), weight 196 lb (88.9 kg), SpO2 96 %.  Physical Exam Physical Exam  Constitutional: He is oriented to person, place, and time. He appears well-developed and well-nourished.  HENT:  Mouth/Throat: Oropharynx is clear and moist.  Eyes: Conjunctivae are normal. No scleral icterus.  Neck: Neck supple.  Cardiovascular: Normal rate, regular rhythm, normal heart sounds and intact distal pulses.  Pulses:      Femoral pulses are 2+ on the right side, and 2+ on the left side. Pulmonary/Chest: Effort normal and breath sounds normal.  Abdominal: Soft. Normal appearance and bowel sounds are normal. There is no tenderness.  Lymphadenopathy:    He has no cervical adenopathy.  Neurological: He is alert and oriented to person, place, and time.  Skin: Skin is warm and dry.  Psychiatric: His behavior is normal.    Data Reviewed Colonic and terminal ileal biopsies of April 13, 2017 failed to show evidence of inflammatory bowel disease.  No infectious process noted.  CT of the abdomen of November 16, 2017 showed a 2 cm mass medial to the descending colon and a few small 1 cm lymph nodes in the mesentery.  Possibility of colon cancer with lymph node and peritoneal metastasis raised.  Consideration for PET scan.  Assessment    Chronic diarrhea, likely related to dietary intake with improvement making use of Lactaid.  Indeterminant CT findings.    Plan    Films were reviewed with the radiologist.  Candidate for CT-guided biopsy.  Possibility of an occult diverticulum and injury during biopsy reviewed.  PET scan would be ideal to rule out a  metabolically active lesion.  The patient has multiple medical comorbidities, and while I diagnostic laparoscopy would be low risk in the average patient, this individual is certainly at higher risk especially if a hand-assisted procedure was needed to resect the index lesion.  Will see if a PET scan can be obtained based on his CT enterography findings.    Approximately 45 minutes was spent reviewing options.  HPI, Physical Exam, Assessment and Plan have been scribed under the direction and in the presence of Robert Bellow, MD. Karie Fetch, RN  I have completed the exam and reviewed the above documentation for accuracy and completeness.  I agree with the above.  Haematologist has been used and any errors in dictation or transcription are unintentional.  Hervey Ard, M.D., F.A.C.S.  Forest Gleason Katrianna Friesenhahn 11/23/2017, 9:38 PM

## 2017-11-23 ENCOUNTER — Encounter: Payer: Self-pay | Admitting: General Surgery

## 2017-11-23 DIAGNOSIS — R1900 Intra-abdominal and pelvic swelling, mass and lump, unspecified site: Secondary | ICD-10-CM | POA: Insufficient documentation

## 2017-11-23 NOTE — Telephone Encounter (Signed)
Spoke with PA C/NP Ellin Mayhew she will disc case with Dr. Gustavo Lah about abnormal CT entero Disc with wife today and Dr. Bary Castilla saw pt 11/22/17 given 4 options  1) PET scan, watch, biospy or remove and surgery will disc with GI   They rec go ahead and do hip surgery pt sch 12/13/17 and [facility]   [place]

## 2017-11-24 ENCOUNTER — Telehealth: Payer: Self-pay | Admitting: *Deleted

## 2017-11-24 ENCOUNTER — Other Ambulatory Visit: Payer: Self-pay | Admitting: *Deleted

## 2017-11-24 DIAGNOSIS — R933 Abnormal findings on diagnostic imaging of other parts of digestive tract: Secondary | ICD-10-CM

## 2017-11-24 DIAGNOSIS — R1900 Intra-abdominal and pelvic swelling, mass and lump, unspecified site: Secondary | ICD-10-CM

## 2017-11-24 NOTE — Telephone Encounter (Signed)
Patient has been scheduled for a PET scan at [facility] for 11-30-17 at 10:30 am (arrive 10 am). Per Abby in the Nuclear Medicine Department, they will call patient the day before study to review instructions.   Patient's wife, [name], was notified of the above and verbalizes understanding. She will notify the patient as he was unavailable to speak with this morning.

## 2017-11-24 NOTE — Telephone Encounter (Signed)
-----   Message from Robert Bellow, MD sent at 11/23/2017  9:45 PM EDT ----- See if we get a PET/CT on this patient with a diagnosis of intra-abdominal mass identified on CT enterography.  Possible carcinoid.

## 2017-11-29 ENCOUNTER — Other Ambulatory Visit: Payer: Self-pay | Admitting: Family Medicine

## 2017-11-29 ENCOUNTER — Inpatient Hospital Stay: Admission: RE | Admit: 2017-11-29 | Payer: Medicare Other | Source: Ambulatory Visit | Admitting: Orthopedic Surgery

## 2017-11-29 ENCOUNTER — Encounter: Admission: RE | Payer: Self-pay | Source: Ambulatory Visit

## 2017-11-29 SURGERY — ARTHROPLASTY, HIP, TOTAL, ANTERIOR APPROACH
Anesthesia: Choice | Laterality: Right

## 2017-11-30 ENCOUNTER — Ambulatory Visit
Admission: RE | Admit: 2017-11-30 | Discharge: 2017-11-30 | Disposition: A | Discharge status: Home / Self Care | Payer: Medicare Other | Source: Ambulatory Visit | Attending: General Surgery | Admitting: General Surgery

## 2017-11-30 DIAGNOSIS — K668 Other specified disorders of peritoneum: Secondary | ICD-10-CM | POA: Diagnosis not present

## 2017-11-30 DIAGNOSIS — R1900 Intra-abdominal and pelvic swelling, mass and lump, unspecified site: Secondary | ICD-10-CM | POA: Diagnosis not present

## 2017-11-30 DIAGNOSIS — K6389 Other specified diseases of intestine: Secondary | ICD-10-CM | POA: Diagnosis not present

## 2017-11-30 LAB — GLUCOSE, CAPILLARY: Glucose-Capillary: 113 mg/dL — ABNORMAL HIGH (ref 65–99)

## 2017-11-30 MED ORDER — FLUDEOXYGLUCOSE F - 18 (FDG) INJECTION
10.9700 | Freq: Once | INTRAVENOUS | Status: AC | PRN
  Administered 2017-11-30: 10.97 via INTRAVENOUS

## 2017-12-01 ENCOUNTER — Telehealth: Payer: Self-pay

## 2017-12-01 NOTE — Telephone Encounter (Signed)
-----   Message from Robert Bellow, MD sent at 12/01/2017 12:38 PM EDT ----- PET CT results reviewed.  Likely not any type of cancer.  Recommend repeat CT of the abdomen and pelvis in six months. Welcome to come in to discuss if he desires.

## 2017-12-01 NOTE — Telephone Encounter (Signed)
Notified patient as instructed, patient pleased. Discussed follow-up appointments, patient agrees. Patient placed in recalls for follow up CT abdomen/pelvis in 6 months.

## 2017-12-06 ENCOUNTER — Ambulatory Visit: Payer: Medicare Other | Admitting: General Surgery

## 2017-12-10 ENCOUNTER — Other Ambulatory Visit: Payer: Self-pay | Admitting: General Surgery

## 2017-12-12 MED ORDER — CEFAZOLIN SODIUM-DEXTROSE 2-4 GM/100ML-% IV SOLN
2.0000 g | Freq: Once | INTRAVENOUS | Status: AC
  Administered 2017-12-13: 2 g via INTRAVENOUS

## 2017-12-13 ENCOUNTER — Other Ambulatory Visit: Payer: Self-pay

## 2017-12-13 ENCOUNTER — Inpatient Hospital Stay: Payer: Medicare Other | Admitting: Anesthesiology

## 2017-12-13 ENCOUNTER — Inpatient Hospital Stay
Admission: RE | Admit: 2017-12-13 | Discharge: 2017-12-17 | DRG: 469 | Disposition: A | Discharge status: Skilled Nursing Facility | Payer: Medicare Other | Attending: Orthopedic Surgery | Admitting: Orthopedic Surgery

## 2017-12-13 ENCOUNTER — Inpatient Hospital Stay: Payer: Medicare Other

## 2017-12-13 ENCOUNTER — Encounter: Payer: Self-pay | Admitting: *Deleted

## 2017-12-13 ENCOUNTER — Encounter
Admission: RE | Disposition: A | Discharge status: Skilled Nursing Facility | Payer: Self-pay | Source: Home / Self Care | Attending: Orthopedic Surgery

## 2017-12-13 DIAGNOSIS — D6859 Other primary thrombophilia: Secondary | ICD-10-CM | POA: Diagnosis present

## 2017-12-13 DIAGNOSIS — Z7901 Long term (current) use of anticoagulants: Secondary | ICD-10-CM

## 2017-12-13 DIAGNOSIS — J9811 Atelectasis: Secondary | ICD-10-CM | POA: Diagnosis not present

## 2017-12-13 DIAGNOSIS — I110 Hypertensive heart disease with heart failure: Secondary | ICD-10-CM | POA: Diagnosis not present

## 2017-12-13 DIAGNOSIS — Z471 Aftercare following joint replacement surgery: Secondary | ICD-10-CM | POA: Diagnosis not present

## 2017-12-13 DIAGNOSIS — K219 Gastro-esophageal reflux disease without esophagitis: Secondary | ICD-10-CM | POA: Diagnosis present

## 2017-12-13 DIAGNOSIS — M1611 Unilateral primary osteoarthritis, right hip: Secondary | ICD-10-CM | POA: Diagnosis present

## 2017-12-13 DIAGNOSIS — Z96642 Presence of left artificial hip joint: Secondary | ICD-10-CM | POA: Diagnosis present

## 2017-12-13 DIAGNOSIS — G92 Toxic encephalopathy: Secondary | ICD-10-CM | POA: Diagnosis not present

## 2017-12-13 DIAGNOSIS — G8918 Other acute postprocedural pain: Secondary | ICD-10-CM

## 2017-12-13 DIAGNOSIS — I2510 Atherosclerotic heart disease of native coronary artery without angina pectoris: Secondary | ICD-10-CM | POA: Diagnosis present

## 2017-12-13 DIAGNOSIS — I10 Essential (primary) hypertension: Secondary | ICD-10-CM | POA: Diagnosis present

## 2017-12-13 DIAGNOSIS — K5090 Crohn's disease, unspecified, without complications: Secondary | ICD-10-CM | POA: Diagnosis present

## 2017-12-13 DIAGNOSIS — Z8619 Personal history of other infectious and parasitic diseases: Secondary | ICD-10-CM

## 2017-12-13 DIAGNOSIS — Z955 Presence of coronary angioplasty implant and graft: Secondary | ICD-10-CM | POA: Diagnosis not present

## 2017-12-13 DIAGNOSIS — Z419 Encounter for procedure for purposes other than remedying health state, unspecified: Secondary | ICD-10-CM

## 2017-12-13 DIAGNOSIS — S71001D Unspecified open wound, right hip, subsequent encounter: Secondary | ICD-10-CM | POA: Diagnosis not present

## 2017-12-13 DIAGNOSIS — E1142 Type 2 diabetes mellitus with diabetic polyneuropathy: Secondary | ICD-10-CM | POA: Diagnosis present

## 2017-12-13 DIAGNOSIS — Z9049 Acquired absence of other specified parts of digestive tract: Secondary | ICD-10-CM

## 2017-12-13 DIAGNOSIS — J189 Pneumonia, unspecified organism: Secondary | ICD-10-CM | POA: Diagnosis not present

## 2017-12-13 DIAGNOSIS — Z87442 Personal history of urinary calculi: Secondary | ICD-10-CM

## 2017-12-13 DIAGNOSIS — J9690 Respiratory failure, unspecified, unspecified whether with hypoxia or hypercapnia: Secondary | ICD-10-CM | POA: Diagnosis not present

## 2017-12-13 DIAGNOSIS — G459 Transient cerebral ischemic attack, unspecified: Secondary | ICD-10-CM | POA: Diagnosis not present

## 2017-12-13 DIAGNOSIS — Z96641 Presence of right artificial hip joint: Secondary | ICD-10-CM | POA: Diagnosis not present

## 2017-12-13 DIAGNOSIS — Z8249 Family history of ischemic heart disease and other diseases of the circulatory system: Secondary | ICD-10-CM

## 2017-12-13 DIAGNOSIS — Z7902 Long term (current) use of antithrombotics/antiplatelets: Secondary | ICD-10-CM

## 2017-12-13 DIAGNOSIS — E785 Hyperlipidemia, unspecified: Secondary | ICD-10-CM | POA: Diagnosis present

## 2017-12-13 DIAGNOSIS — J9601 Acute respiratory failure with hypoxia: Secondary | ICD-10-CM | POA: Diagnosis not present

## 2017-12-13 DIAGNOSIS — A419 Sepsis, unspecified organism: Secondary | ICD-10-CM | POA: Diagnosis not present

## 2017-12-13 DIAGNOSIS — R4020 Unspecified coma: Secondary | ICD-10-CM | POA: Diagnosis not present

## 2017-12-13 DIAGNOSIS — I639 Cerebral infarction, unspecified: Secondary | ICD-10-CM

## 2017-12-13 DIAGNOSIS — I4891 Unspecified atrial fibrillation: Secondary | ICD-10-CM | POA: Diagnosis not present

## 2017-12-13 DIAGNOSIS — I63233 Cerebral infarction due to unspecified occlusion or stenosis of bilateral carotid arteries: Secondary | ICD-10-CM | POA: Diagnosis not present

## 2017-12-13 DIAGNOSIS — I252 Old myocardial infarction: Secondary | ICD-10-CM

## 2017-12-13 DIAGNOSIS — I480 Paroxysmal atrial fibrillation: Secondary | ICD-10-CM | POA: Diagnosis present

## 2017-12-13 DIAGNOSIS — Z7401 Bed confinement status: Secondary | ICD-10-CM | POA: Diagnosis not present

## 2017-12-13 DIAGNOSIS — Z136 Encounter for screening for cardiovascular disorders: Secondary | ICD-10-CM | POA: Diagnosis not present

## 2017-12-13 DIAGNOSIS — R29709 NIHSS score 9: Secondary | ICD-10-CM | POA: Diagnosis not present

## 2017-12-13 DIAGNOSIS — Z8261 Family history of arthritis: Secondary | ICD-10-CM

## 2017-12-13 DIAGNOSIS — R4182 Altered mental status, unspecified: Secondary | ICD-10-CM | POA: Diagnosis not present

## 2017-12-13 DIAGNOSIS — I6389 Other cerebral infarction: Secondary | ICD-10-CM | POA: Diagnosis not present

## 2017-12-13 DIAGNOSIS — R6520 Severe sepsis without septic shock: Secondary | ICD-10-CM | POA: Diagnosis not present

## 2017-12-13 DIAGNOSIS — Z79899 Other long term (current) drug therapy: Secondary | ICD-10-CM

## 2017-12-13 DIAGNOSIS — R509 Fever, unspecified: Secondary | ICD-10-CM

## 2017-12-13 DIAGNOSIS — R2689 Other abnormalities of gait and mobility: Secondary | ICD-10-CM | POA: Diagnosis not present

## 2017-12-13 DIAGNOSIS — Z5189 Encounter for other specified aftercare: Secondary | ICD-10-CM | POA: Diagnosis not present

## 2017-12-13 DIAGNOSIS — M25551 Pain in right hip: Secondary | ICD-10-CM | POA: Diagnosis not present

## 2017-12-13 DIAGNOSIS — Z87891 Personal history of nicotine dependence: Secondary | ICD-10-CM

## 2017-12-13 DIAGNOSIS — Z9842 Cataract extraction status, left eye: Secondary | ICD-10-CM

## 2017-12-13 DIAGNOSIS — I509 Heart failure, unspecified: Secondary | ICD-10-CM | POA: Diagnosis not present

## 2017-12-13 DIAGNOSIS — Z981 Arthrodesis status: Secondary | ICD-10-CM

## 2017-12-13 DIAGNOSIS — E119 Type 2 diabetes mellitus without complications: Secondary | ICD-10-CM | POA: Diagnosis not present

## 2017-12-13 DIAGNOSIS — E1151 Type 2 diabetes mellitus with diabetic peripheral angiopathy without gangrene: Secondary | ICD-10-CM | POA: Diagnosis present

## 2017-12-13 DIAGNOSIS — Z833 Family history of diabetes mellitus: Secondary | ICD-10-CM

## 2017-12-13 DIAGNOSIS — I482 Chronic atrial fibrillation: Secondary | ICD-10-CM | POA: Diagnosis present

## 2017-12-13 DIAGNOSIS — Z961 Presence of intraocular lens: Secondary | ICD-10-CM | POA: Diagnosis present

## 2017-12-13 DIAGNOSIS — Z86010 Personal history of colonic polyps: Secondary | ICD-10-CM

## 2017-12-13 DIAGNOSIS — E569 Vitamin deficiency, unspecified: Secondary | ICD-10-CM | POA: Diagnosis not present

## 2017-12-13 DIAGNOSIS — G4733 Obstructive sleep apnea (adult) (pediatric): Secondary | ICD-10-CM | POA: Diagnosis present

## 2017-12-13 DIAGNOSIS — Z794 Long term (current) use of insulin: Secondary | ICD-10-CM

## 2017-12-13 DIAGNOSIS — Z8711 Personal history of peptic ulcer disease: Secondary | ICD-10-CM

## 2017-12-13 DIAGNOSIS — Z9841 Cataract extraction status, right eye: Secondary | ICD-10-CM

## 2017-12-13 DIAGNOSIS — Z951 Presence of aortocoronary bypass graft: Secondary | ICD-10-CM

## 2017-12-13 DIAGNOSIS — R531 Weakness: Secondary | ICD-10-CM | POA: Diagnosis not present

## 2017-12-13 DIAGNOSIS — M109 Gout, unspecified: Secondary | ICD-10-CM | POA: Diagnosis present

## 2017-12-13 DIAGNOSIS — R918 Other nonspecific abnormal finding of lung field: Secondary | ICD-10-CM | POA: Diagnosis not present

## 2017-12-13 DIAGNOSIS — M6281 Muscle weakness (generalized): Secondary | ICD-10-CM | POA: Diagnosis not present

## 2017-12-13 DIAGNOSIS — J9600 Acute respiratory failure, unspecified whether with hypoxia or hypercapnia: Secondary | ICD-10-CM

## 2017-12-13 DIAGNOSIS — Z888 Allergy status to other drugs, medicaments and biological substances status: Secondary | ICD-10-CM

## 2017-12-13 HISTORY — PX: TOTAL HIP ARTHROPLASTY: SHX124

## 2017-12-13 LAB — TYPE AND SCREEN
ABO/RH(D): A POS
ANTIBODY SCREEN: NEGATIVE

## 2017-12-13 LAB — PROTIME-INR
INR: 1.12
PROTHROMBIN TIME: 14.3 s (ref 11.4–15.2)

## 2017-12-13 LAB — GLUCOSE, CAPILLARY
GLUCOSE-CAPILLARY: 254 mg/dL — AB (ref 65–99)
GLUCOSE-CAPILLARY: 223 mg/dL — AB (ref 65–99)
Glucose-Capillary: 263 mg/dL — ABNORMAL HIGH (ref 65–99)
Glucose-Capillary: 168 mg/dL — ABNORMAL HIGH (ref 65–99)

## 2017-12-13 SURGERY — ARTHROPLASTY, HIP, TOTAL, ANTERIOR APPROACH
Anesthesia: Spinal | Laterality: Right

## 2017-12-13 MED ORDER — COLCHICINE 0.6 MG PO TABS
0.6000 mg | ORAL_TABLET | Freq: Every day | ORAL | Status: DC
  Administered 2017-12-14 – 2017-12-17 (×4): 0.6 mg via ORAL
  Filled 2017-12-13 (×4): qty 1

## 2017-12-13 MED ORDER — WARFARIN - PHARMACIST DOSING INPATIENT
Freq: Every day | Status: DC
  Administered 2017-12-15: 19:00:00

## 2017-12-13 MED ORDER — CEFAZOLIN SODIUM-DEXTROSE 2-4 GM/100ML-% IV SOLN
INTRAVENOUS | Status: AC
  Filled 2017-12-13: qty 100

## 2017-12-13 MED ORDER — METHOCARBAMOL 1000 MG/10ML IJ SOLN
500.0000 mg | Freq: Four times a day (QID) | INTRAVENOUS | Status: DC | PRN
  Filled 2017-12-13: qty 5

## 2017-12-13 MED ORDER — PRAVASTATIN SODIUM 20 MG PO TABS
40.0000 mg | ORAL_TABLET | Freq: Every day | ORAL | Status: DC
  Administered 2017-12-15 – 2017-12-16 (×2): 40 mg via ORAL
  Filled 2017-12-13 (×3): qty 2

## 2017-12-13 MED ORDER — METFORMIN HCL 500 MG PO TABS
1000.0000 mg | ORAL_TABLET | Freq: Two times a day (BID) | ORAL | Status: DC
  Administered 2017-12-14 – 2017-12-17 (×6): 1000 mg via ORAL
  Filled 2017-12-13 (×8): qty 2

## 2017-12-13 MED ORDER — AZELASTINE HCL 0.1 % NA SOLN
2.0000 | Freq: Every day | NASAL | Status: DC
  Administered 2017-12-13 – 2017-12-17 (×5): 2 via NASAL
  Filled 2017-12-13: qty 30

## 2017-12-13 MED ORDER — MAGNESIUM CITRATE PO SOLN
1.0000 | Freq: Once | ORAL | Status: DC | PRN
  Filled 2017-12-13: qty 296

## 2017-12-13 MED ORDER — TRANEXAMIC ACID 1000 MG/10ML IV SOLN
1000.0000 mg | INTRAVENOUS | Status: DC
  Filled 2017-12-13: qty 10

## 2017-12-13 MED ORDER — INSULIN ASPART 100 UNIT/ML ~~LOC~~ SOLN
4.0000 [IU] | Freq: Three times a day (TID) | SUBCUTANEOUS | Status: DC
  Administered 2017-12-13 – 2017-12-15 (×6): 4 [IU] via SUBCUTANEOUS
  Filled 2017-12-13 (×6): qty 1

## 2017-12-13 MED ORDER — PROBENECID 500 MG PO TABS
500.0000 mg | ORAL_TABLET | Freq: Two times a day (BID) | ORAL | Status: DC
  Administered 2017-12-15 – 2017-12-17 (×5): 500 mg via ORAL
  Filled 2017-12-13 (×9): qty 1

## 2017-12-13 MED ORDER — ACETAMINOPHEN 10 MG/ML IV SOLN
INTRAVENOUS | Status: DC | PRN
  Administered 2017-12-13: 1000 mg via INTRAVENOUS

## 2017-12-13 MED ORDER — WARFARIN SODIUM 3 MG PO TABS: 3.0000 mg | ORAL_TABLET | ORAL | Status: DC

## 2017-12-13 MED ORDER — PROPOFOL 500 MG/50ML IV EMUL
INTRAVENOUS | Status: DC | PRN
  Administered 2017-12-13: 75 ug/kg/min via INTRAVENOUS

## 2017-12-13 MED ORDER — CLOPIDOGREL BISULFATE 75 MG PO TABS
75.0000 mg | ORAL_TABLET | Freq: Every day | ORAL | Status: DC
  Administered 2017-12-13 – 2017-12-17 (×5): 75 mg via ORAL
  Filled 2017-12-13 (×5): qty 1

## 2017-12-13 MED ORDER — MIDAZOLAM HCL 2 MG/2ML IJ SOLN
INTRAMUSCULAR | Status: AC
  Filled 2017-12-13: qty 2

## 2017-12-13 MED ORDER — MAGNESIUM HYDROXIDE 400 MG/5ML PO SUSP
30.0000 mL | Freq: Every day | ORAL | Status: DC | PRN
  Administered 2017-12-16: 30 mL via ORAL
  Filled 2017-12-13: qty 30

## 2017-12-13 MED ORDER — ALUM & MAG HYDROXIDE-SIMETH 200-200-20 MG/5ML PO SUSP
30.0000 mL | ORAL | Status: DC | PRN
  Filled 2017-12-13: qty 30

## 2017-12-13 MED ORDER — HYDRALAZINE HCL 50 MG PO TABS
100.0000 mg | ORAL_TABLET | Freq: Three times a day (TID) | ORAL | Status: DC
  Administered 2017-12-13 – 2017-12-17 (×8): 100 mg via ORAL
  Filled 2017-12-13 (×10): qty 2

## 2017-12-13 MED ORDER — ACETAMINOPHEN 10 MG/ML IV SOLN
INTRAVENOUS | Status: AC
  Filled 2017-12-13: qty 100

## 2017-12-13 MED ORDER — PROPOFOL 10 MG/ML IV BOLUS
INTRAVENOUS | Status: AC
  Filled 2017-12-13: qty 60

## 2017-12-13 MED ORDER — WARFARIN SODIUM 4 MG PO TABS: 4.0000 mg | ORAL_TABLET | ORAL | Status: DC

## 2017-12-13 MED ORDER — ONDANSETRON HCL 4 MG/2ML IJ SOLN
INTRAMUSCULAR | Status: DC | PRN
  Administered 2017-12-13: 4 mg via INTRAVENOUS

## 2017-12-13 MED ORDER — CARVEDILOL 25 MG PO TABS
25.0000 mg | ORAL_TABLET | Freq: Two times a day (BID) | ORAL | Status: DC
  Administered 2017-12-14 – 2017-12-17 (×6): 25 mg via ORAL
  Filled 2017-12-13 (×4): qty 1
  Filled 2017-12-13 (×2): qty 2
  Filled 2017-12-13: qty 1

## 2017-12-13 MED ORDER — TRANEXAMIC ACID 1000 MG/10ML IV SOLN
INTRAVENOUS | Status: DC | PRN
  Administered 2017-12-13: 1000 mg via INTRAVENOUS

## 2017-12-13 MED ORDER — HYDROCORTISONE 2.5 % EX CREA
1.0000 "application " | TOPICAL_CREAM | Freq: Every day | CUTANEOUS | Status: DC | PRN
  Filled 2017-12-13: qty 30

## 2017-12-13 MED ORDER — METOCLOPRAMIDE HCL 10 MG PO TABS
5.0000 mg | ORAL_TABLET | Freq: Three times a day (TID) | ORAL | Status: DC | PRN
  Filled 2017-12-13: qty 1

## 2017-12-13 MED ORDER — ADULT MULTIVITAMIN W/MINERALS CH
1.0000 | ORAL_TABLET | Freq: Every day | ORAL | Status: DC
  Administered 2017-12-14 – 2017-12-17 (×4): 1 via ORAL
  Filled 2017-12-13 (×4): qty 1

## 2017-12-13 MED ORDER — VITAMIN E 180 MG (400 UNIT) PO CAPS
400.0000 [IU] | ORAL_CAPSULE | Freq: Two times a day (BID) | ORAL | Status: DC
  Administered 2017-12-13 – 2017-12-17 (×7): 400 [IU] via ORAL
  Filled 2017-12-13 (×9): qty 1

## 2017-12-13 MED ORDER — DOCUSATE SODIUM 100 MG PO CAPS
100.0000 mg | ORAL_CAPSULE | Freq: Two times a day (BID) | ORAL | Status: DC
  Administered 2017-12-13 – 2017-12-17 (×6): 100 mg via ORAL
  Filled 2017-12-13 (×7): qty 1

## 2017-12-13 MED ORDER — VITAMIN D (ERGOCALCIFEROL) 1.25 MG (50000 UNIT) PO CAPS
50000.0000 [IU] | ORAL_CAPSULE | ORAL | Status: DC
  Administered 2017-12-17: 50000 [IU] via ORAL
  Filled 2017-12-13 (×2): qty 1

## 2017-12-13 MED ORDER — SODIUM CHLORIDE 0.9 % IV SOLN
500.0000 mL | INTRAVENOUS | Status: DC
  Administered 2017-12-13: 1000 mL via INTRAVENOUS

## 2017-12-13 MED ORDER — INSULIN ASPART 100 UNIT/ML ~~LOC~~ SOLN
0.0000 [IU] | Freq: Three times a day (TID) | SUBCUTANEOUS | Status: DC
  Administered 2017-12-13 – 2017-12-14 (×2): 8 [IU] via SUBCUTANEOUS
  Administered 2017-12-14 – 2017-12-16 (×6): 5 [IU] via SUBCUTANEOUS
  Administered 2017-12-16: 3 [IU] via SUBCUTANEOUS
  Administered 2017-12-16: 5 [IU] via SUBCUTANEOUS
  Administered 2017-12-17: 3 [IU] via SUBCUTANEOUS
  Administered 2017-12-17: 5 [IU] via SUBCUTANEOUS
  Filled 2017-12-13 (×12): qty 1

## 2017-12-13 MED ORDER — CEFAZOLIN SODIUM-DEXTROSE 2-4 GM/100ML-% IV SOLN
2.0000 g | Freq: Four times a day (QID) | INTRAVENOUS | Status: AC
  Administered 2017-12-13 – 2017-12-14 (×3): 2 g via INTRAVENOUS
  Filled 2017-12-13 (×3): qty 100

## 2017-12-13 MED ORDER — AMLODIPINE BESYLATE 10 MG PO TABS
10.0000 mg | ORAL_TABLET | Freq: Every day | ORAL | Status: DC
  Administered 2017-12-15 – 2017-12-16 (×2): 10 mg via ORAL
  Filled 2017-12-13: qty 2
  Filled 2017-12-13 (×2): qty 1

## 2017-12-13 MED ORDER — DICYCLOMINE HCL 10 MG PO CAPS
10.0000 mg | ORAL_CAPSULE | Freq: Three times a day (TID) | ORAL | Status: DC
  Administered 2017-12-14 – 2017-12-17 (×10): 10 mg via ORAL
  Filled 2017-12-13 (×12): qty 1

## 2017-12-13 MED ORDER — ACETAMINOPHEN 500 MG PO TABS
500.0000 mg | ORAL_TABLET | Freq: Four times a day (QID) | ORAL | Status: AC
  Administered 2017-12-14 (×2): 500 mg via ORAL
  Filled 2017-12-13 (×2): qty 1

## 2017-12-13 MED ORDER — PROPOFOL 10 MG/ML IV BOLUS
INTRAVENOUS | Status: DC | PRN
  Administered 2017-12-13: 40 mg via INTRAVENOUS

## 2017-12-13 MED ORDER — METOCLOPRAMIDE HCL 5 MG/ML IJ SOLN: 5.0000 mg | Freq: Three times a day (TID) | INTRAMUSCULAR | Status: DC | PRN

## 2017-12-13 MED ORDER — PANTOPRAZOLE SODIUM 40 MG PO TBEC
80.0000 mg | DELAYED_RELEASE_TABLET | Freq: Every day | ORAL | Status: DC
  Administered 2017-12-14 – 2017-12-17 (×4): 80 mg via ORAL
  Filled 2017-12-13 (×4): qty 2

## 2017-12-13 MED ORDER — ONDANSETRON HCL 4 MG/2ML IJ SOLN
4.0000 mg | Freq: Four times a day (QID) | INTRAMUSCULAR | Status: DC | PRN
  Administered 2017-12-13 – 2017-12-14 (×2): 4 mg via INTRAVENOUS
  Filled 2017-12-13 (×2): qty 2

## 2017-12-13 MED ORDER — ZOLPIDEM TARTRATE 5 MG PO TABS: 5.0000 mg | ORAL_TABLET | Freq: Every evening | ORAL | Status: DC | PRN

## 2017-12-13 MED ORDER — MESALAMINE 1.2 G PO TBEC
2.4000 g | DELAYED_RELEASE_TABLET | Freq: Every day | ORAL | Status: DC
  Administered 2017-12-14 – 2017-12-17 (×4): 2.4 g via ORAL
  Filled 2017-12-13 (×4): qty 2

## 2017-12-13 MED ORDER — METHOCARBAMOL 500 MG PO TABS
500.0000 mg | ORAL_TABLET | Freq: Four times a day (QID) | ORAL | Status: DC | PRN
  Administered 2017-12-13 – 2017-12-17 (×2): 500 mg via ORAL
  Filled 2017-12-13 (×3): qty 1

## 2017-12-13 MED ORDER — BUPIVACAINE-EPINEPHRINE 0.25% -1:200000 IJ SOLN
INTRAMUSCULAR | Status: DC | PRN
  Administered 2017-12-13: 30 mL

## 2017-12-13 MED ORDER — NEOMYCIN-POLYMYXIN B GU 40-200000 IR SOLN
Status: DC | PRN
  Administered 2017-12-13: 4 mL

## 2017-12-13 MED ORDER — MIDAZOLAM HCL 5 MG/5ML IJ SOLN
INTRAMUSCULAR | Status: DC | PRN
  Administered 2017-12-13: 2 mg via INTRAVENOUS

## 2017-12-13 MED ORDER — ISOSORBIDE MONONITRATE ER 30 MG PO TB24
120.0000 mg | ORAL_TABLET | Freq: Every day | ORAL | Status: DC
  Administered 2017-12-14 – 2017-12-17 (×4): 120 mg via ORAL
  Filled 2017-12-13 (×4): qty 4

## 2017-12-13 MED ORDER — ONDANSETRON HCL 4 MG PO TABS: 4.0000 mg | ORAL_TABLET | Freq: Four times a day (QID) | ORAL | Status: DC | PRN

## 2017-12-13 MED ORDER — ACETAMINOPHEN 325 MG PO TABS
325.0000 mg | ORAL_TABLET | Freq: Four times a day (QID) | ORAL | Status: DC | PRN
  Administered 2017-12-16 (×2): 650 mg via ORAL
  Filled 2017-12-13 (×3): qty 2

## 2017-12-13 MED ORDER — NITROGLYCERIN 0.4 MG SL SUBL: 0.4000 mg | SUBLINGUAL_TABLET | SUBLINGUAL | Status: DC | PRN

## 2017-12-13 MED ORDER — WARFARIN SODIUM 4 MG PO TABS
4.0000 mg | ORAL_TABLET | ORAL | Status: DC
  Administered 2017-12-15: 4 mg via ORAL
  Filled 2017-12-13 (×3): qty 1

## 2017-12-13 MED ORDER — VITAMIN C 500 MG PO TABS
500.0000 mg | ORAL_TABLET | Freq: Every day | ORAL | Status: DC
  Administered 2017-12-15 – 2017-12-16 (×2): 500 mg via ORAL
  Filled 2017-12-13 (×5): qty 1

## 2017-12-13 MED ORDER — MORPHINE SULFATE (PF) 2 MG/ML IV SOLN
0.5000 mg | INTRAVENOUS | Status: DC | PRN
  Administered 2017-12-13 – 2017-12-15 (×3): 1 mg via INTRAVENOUS
  Filled 2017-12-13 (×4): qty 1

## 2017-12-13 MED ORDER — BISACODYL 10 MG RE SUPP
10.0000 mg | Freq: Every day | RECTAL | Status: DC | PRN
  Administered 2017-12-16: 10 mg via RECTAL
  Filled 2017-12-13: qty 1

## 2017-12-13 MED ORDER — MENTHOL 3 MG MT LOZG
1.0000 | LOZENGE | OROMUCOSAL | Status: DC | PRN
  Filled 2017-12-13: qty 9

## 2017-12-13 MED ORDER — GABAPENTIN 300 MG PO CAPS
600.0000 mg | ORAL_CAPSULE | Freq: Three times a day (TID) | ORAL | Status: DC
  Administered 2017-12-13 – 2017-12-17 (×9): 600 mg via ORAL
  Filled 2017-12-13 (×10): qty 2

## 2017-12-13 MED ORDER — DIPHENHYDRAMINE HCL 12.5 MG/5ML PO ELIX
12.5000 mg | ORAL_SOLUTION | ORAL | Status: DC | PRN
  Filled 2017-12-13: qty 10

## 2017-12-13 MED ORDER — INSULIN DETEMIR 100 UNIT/ML ~~LOC~~ SOLN
90.0000 [IU] | Freq: Every day | SUBCUTANEOUS | Status: DC
  Administered 2017-12-13 – 2017-12-16 (×4): 90 [IU] via SUBCUTANEOUS
  Filled 2017-12-13 (×6): qty 0.9

## 2017-12-13 MED ORDER — SODIUM CHLORIDE 0.9 % IV SOLN
INTRAVENOUS | Status: DC | PRN
  Administered 2017-12-13: 50 ug/min via INTRAVENOUS

## 2017-12-13 MED ORDER — BUPIVACAINE-EPINEPHRINE (PF) 0.25% -1:200000 IJ SOLN
INTRAMUSCULAR | Status: AC
  Filled 2017-12-13: qty 30

## 2017-12-13 MED ORDER — PHENOL 1.4 % MT LIQD
1.0000 | OROMUCOSAL | Status: DC | PRN
  Filled 2017-12-13: qty 177

## 2017-12-13 MED ORDER — SODIUM CHLORIDE 0.9 % IV SOLN
INTRAVENOUS | Status: DC
  Administered 2017-12-13 – 2017-12-15 (×2): via INTRAVENOUS

## 2017-12-13 MED ORDER — HYDROCODONE-ACETAMINOPHEN 5-325 MG PO TABS
1.0000 | ORAL_TABLET | ORAL | Status: DC | PRN
  Administered 2017-12-15: 1 via ORAL
  Administered 2017-12-16 – 2017-12-17 (×3): 2 via ORAL
  Filled 2017-12-13: qty 2
  Filled 2017-12-13 (×3): qty 1
  Filled 2017-12-13: qty 2
  Filled 2017-12-13: qty 1

## 2017-12-13 MED ORDER — NEOMYCIN-POLYMYXIN B GU 40-200000 IR SOLN
Status: AC
  Filled 2017-12-13: qty 4

## 2017-12-13 MED ORDER — HYDROCODONE-ACETAMINOPHEN 7.5-325 MG PO TABS
1.0000 | ORAL_TABLET | ORAL | Status: DC | PRN
  Administered 2017-12-13: 1 via ORAL
  Administered 2017-12-14 (×2): 2 via ORAL
  Administered 2017-12-16: 1 via ORAL
  Filled 2017-12-13 (×2): qty 2
  Filled 2017-12-13 (×2): qty 1

## 2017-12-13 MED ORDER — BUPIVACAINE HCL (PF) 0.5 % IJ SOLN
INTRAMUSCULAR | Status: DC | PRN
  Administered 2017-12-13: 3 mL

## 2017-12-13 SURGICAL SUPPLY — 55 items
BLADE SAW SAG 18.5X105 (BLADE) ×3 IMPLANT
BNDG COHESIVE 6X5 TAN STRL LF (GAUZE/BANDAGES/DRESSINGS) ×9 IMPLANT
CANISTER SUCT 1200ML W/VALVE (MISCELLANEOUS) ×3 IMPLANT
CHLORAPREP W/TINT 26ML (MISCELLANEOUS) ×3 IMPLANT
DRAPE C-ARM XRAY 36X54 (DRAPES) ×3 IMPLANT
DRAPE INCISE IOBAN 66X60 STRL (DRAPES) IMPLANT
DRAPE POUCH INSTRU U-SHP 10X18 (DRAPES) ×3 IMPLANT
DRAPE SHEET LG 3/4 BI-LAMINATE (DRAPES) ×9 IMPLANT
DRAPE TABLE BACK 80X90 (DRAPES) ×3 IMPLANT
DRESSING SURGICEL FIBRLLR 1X2 (HEMOSTASIS) ×2 IMPLANT
DRSG OPSITE POSTOP 4X8 (GAUZE/BANDAGES/DRESSINGS) ×6 IMPLANT
DRSG SURGICEL FIBRILLAR 1X2 (HEMOSTASIS) ×6
ELECT BLADE 6.5 EXT (BLADE) ×3 IMPLANT
ELECT REM PT RETURN 9FT ADLT (ELECTROSURGICAL) ×3
ELECTRODE REM PT RTRN 9FT ADLT (ELECTROSURGICAL) ×1 IMPLANT
EVACUATOR 1/8 PVC DRAIN (DRAIN) IMPLANT
GLOVE BIOGEL PI IND STRL 9 (GLOVE) ×1 IMPLANT
GLOVE BIOGEL PI INDICATOR 9 (GLOVE) ×2
GLOVE SURG SYN 9.0  PF PI (GLOVE) ×4
GLOVE SURG SYN 9.0 PF PI (GLOVE) ×2 IMPLANT
GOWN SRG 2XL LVL 4 RGLN SLV (GOWNS) ×1 IMPLANT
GOWN STRL NON-REIN 2XL LVL4 (GOWNS) ×2
GOWN STRL REUS W/ TWL LRG LVL3 (GOWN DISPOSABLE) ×1 IMPLANT
GOWN STRL REUS W/TWL LRG LVL3 (GOWN DISPOSABLE) ×2
HIP FEM HD S 28 (Head) ×3 IMPLANT
HOLDER FOLEY CATH W/STRAP (MISCELLANEOUS) ×3 IMPLANT
HOOD PEEL AWAY FLYTE STAYCOOL (MISCELLANEOUS) ×3 IMPLANT
KIT PREVENA INCISION MGT 13 (CANNISTER) ×3 IMPLANT
LINER DBL MOB SZ 0 52MM (Liner) ×3 IMPLANT
MAT BLUE FLOOR 46X72 FLO (MISCELLANEOUS) ×3 IMPLANT
NDL SAFETY ECLIPSE 18X1.5 (NEEDLE) ×1 IMPLANT
NEEDLE HYPO 18GX1.5 SHARP (NEEDLE) ×2
NEEDLE SPNL 18GX3.5 QUINCKE PK (NEEDLE) ×3 IMPLANT
NS IRRIG 1000ML POUR BTL (IV SOLUTION) ×3 IMPLANT
PACK HIP COMPR (MISCELLANEOUS) ×3 IMPLANT
SHELL ACETABULAR SZ 52 DM (Shell) ×3 IMPLANT
SOL PREP PVP 2OZ (MISCELLANEOUS) ×3
SOLUTION PREP PVP 2OZ (MISCELLANEOUS) ×1 IMPLANT
SPONGE DRAIN TRACH 4X4 STRL 2S (GAUZE/BANDAGES/DRESSINGS) ×3 IMPLANT
STAPLER SKIN PROX 35W (STAPLE) ×3 IMPLANT
STEM FEMORAL SZ6 LAT COLLARED (Stem) ×3 IMPLANT
STRAP SAFETY 5IN WIDE (MISCELLANEOUS) ×3 IMPLANT
SUT DVC 2 QUILL PDO  T11 36X36 (SUTURE) ×2
SUT DVC 2 QUILL PDO T11 36X36 (SUTURE) ×1 IMPLANT
SUT SILK 0 (SUTURE) ×2
SUT SILK 0 30XBRD TIE 6 (SUTURE) ×1 IMPLANT
SUT V-LOC 90 ABS DVC 3-0 CL (SUTURE) ×3 IMPLANT
SUT VIC AB 1 CT1 36 (SUTURE) ×3 IMPLANT
SYR 20CC LL (SYRINGE) ×3 IMPLANT
SYR 30ML LL (SYRINGE) ×3 IMPLANT
SYR BULB IRRIG 60ML STRL (SYRINGE) ×3 IMPLANT
TAPE MICROFOAM 4IN (TAPE) ×3 IMPLANT
TOWEL OR 17X26 4PK STRL BLUE (TOWEL DISPOSABLE) ×3 IMPLANT
TRAY FOLEY W/METER SILVER 16FR (SET/KITS/TRAYS/PACK) ×3 IMPLANT
WND VAC CANISTER 500ML (MISCELLANEOUS) ×3 IMPLANT

## 2017-12-13 NOTE — Consult Note (Addendum)
ANTICOAGULATION CONSULT NOTE - Initial Consult  Pharmacy Consult for Warfarin Dosing  Indication: atrial fibrillation and VTE prophylaxis  Allergies  Allergen Reactions  . Allopurinol Diarrhea, Other (See Comments) and Nausea And Vomiting    Other Reaction: GI Upset  . Atenolol Other (See Comments)    Other Reaction: bradycardia  . Atorvastatin Other (See Comments)    Other Reaction: muscle aches  . Ramipril Rash  . Rosuvastatin Other (See Comments)    Muscle aches  . Simvastatin Other (See Comments)    Other Reaction: MUSCLE ACHES (Zocor)  . Valsartan Other (See Comments)    Other Reaction: facial swelling  . Cardizem [Diltiazem] Rash   Patient Measurements: Height: 5' 8"  (172.7 cm) Weight: 196 lb (88.9 kg) IBW/kg (Calculated) : 68.4  Vital Signs: Temp: 98 F (36.7 C) (04/02 1633) Temp Source: Tympanic (04/02 1633) BP: 101/50 (04/02 1703) Pulse Rate: 53 (04/02 1703)  Labs: Recent Labs    12/13/17 1232  LABPROT 14.3  INR 1.12   CrCl cannot be calculated (Patient's most recent lab result is older than the maximum 21 days allowed.).  Medical History: Past Medical History:  Diagnosis Date  . Arthritis   . Atrial fibrillation ([place])   . CAD (coronary artery disease)    6 STENTS  . CHF (congestive heart failure) ([place])   . Chicken pox   . Colon polyp   . Corneal dystrophy    bilateral  . Crohn's disease ([place])   . Diabetes ([place]) 2002  . Dysrhythmia    A-FIB AND PAF  . GERD (gastroesophageal reflux disease)   . Gout   . History of kidney stones   . History of shingles   . Hyperlipemia   . Hypertension   . Kidney stones   . Myocardial infarction ([place])    x4, last one in 2010  . Peripheral neuropathy   . Peripheral neuropathy   . Peripheral neuropathy   . PUD (peptic ulcer disease)   . Renal artery stenosis ([place])   . Renal artery stenosis ([place])   . Salzmann's nodular dystrophy 2012  . Sleep apnea   . Spinal stenosis     Assessment: Pharmacy  consulted for Warfarin dosing and monitoring for VTE prophylaxis in 72 yo male with PMH of A. Fib. Patient admitted S/P right total hip arthoplasty.   Home Regimen: Warfarin 31m: T,W, Th, F, Sa                             Warfarin 39m Su, M  DATE INR DOSE 4/2 1.12  Goal of Therapy:  INR 2-3 Monitor platelets by anticoagulation protocol: Yes   Plan:  Will start patient home regimen, with warfarin 60m81monight.   INR daily per protocol Pharmacy will continue to follow and adjust per consult.   ShePernell DupreharmD, BCPS Clinical Pharmacist 12/13/2017 5:31 PM

## 2017-12-13 NOTE — Anesthesia Preprocedure Evaluation (Signed)
Anesthesia Evaluation  Patient identified by MRN, date of birth, ID band Patient awake    Reviewed: Allergy & Precautions, NPO status , Patient's Chart, lab work & pertinent test results, reviewed documented beta blocker date and time   Airway Mallampati: III  TM Distance: >3 FB     Dental  (+) Chipped   Pulmonary sleep apnea , former smoker,           Cardiovascular hypertension, Pt. on medications and Pt. on home beta blockers + CAD, + Past MI, + Cardiac Stents, + CABG, + Peripheral Vascular Disease and +CHF  + dysrhythmias Atrial Fibrillation      Neuro/Psych  Neuromuscular disease    GI/Hepatic PUD, GERD  ,  Endo/Other  diabetes, Type 2  Renal/GU Renal disease     Musculoskeletal  (+) Arthritis ,   Abdominal   Peds  Hematology   Anesthesia Other Findings Spinal stnosis. Off plavix for 7 days. No NTG,\. Cannot use CPAP, so stays sleepy all day. Ablation for AF. Renal a stent.  Reproductive/Obstetrics                             Anesthesia Physical Anesthesia Plan  ASA: III  Anesthesia Plan: General   Post-op Pain Management:    Induction: Intravenous  PONV Risk Score and Plan:   Airway Management Planned:   Additional Equipment:   Intra-op Plan:   Post-operative Plan:   Informed Consent: I have reviewed the patients History and Physical, chart, labs and discussed the procedure including the risks, benefits and alternatives for the proposed anesthesia with the patient or authorized representative who has indicated his/her understanding and acceptance.     Plan Discussed with: CRNA  Anesthesia Plan Comments:         Anesthesia Quick Evaluation

## 2017-12-13 NOTE — NC FL2 (Signed)
  [place] LEVEL OF CARE SCREENING TOOL     IDENTIFICATION  Patient Name: Chase Cabrera Birthdate: [birth year] Sex: male Admission Date (Current Location): 12/13/2017  [place] and [place] Number:  Engineering geologist and Address:  [facility], [address], [place], [place] [address]      Provider Number: [id number]  Attending Physician Name and Address:  Hessie Knows, MD  Relative Name and Phone Number:       Current Level of Care: Hospital Recommended Level of Care: [place] Prior Approval Number:    Date Approved/Denied:   PASRR Number: [id number] )  Discharge Plan: SNF    Current Diagnoses: Patient Active Problem List   Diagnosis Date Noted  . Osteoarthritis of right hip 12/13/2017  . Intraabdominal mass 11/23/2017  . Colonic mass 11/18/2017  . Crohn's disease ([place]) 10/20/2017  . Pulmonary nodule 10/07/2017  . History of skin cancer 10/07/2017  . Rib pain on right side 03/07/2017  . Upper respiratory tract infection 09/09/2016  . Osteoarthritis 04/08/2016  . Spinal stenosis, lumbar 03/25/2016  . DM type 2 with diabetic peripheral neuropathy ([place]) 07/15/2014  . Gout 05/04/2013  . Long term current use of anticoagulant 07/29/2011  . Renal artery stenosis ([place]) 07/01/2011  . Atrial fibrillation ([place]) 06/30/2011  . CAD (coronary artery disease) 06/30/2011  . Hyperlipidemia 06/30/2011  . Essential hypertension 06/30/2011    Orientation RESPIRATION BLADDER Height & Weight     Self, Time, Situation, Place  Normal Continent Weight: 196 lb (88.9 kg) Height:  5' 8"  (172.7 cm)  BEHAVIORAL SYMPTOMS/MOOD NEUROLOGICAL BOWEL NUTRITION STATUS      Continent Diet(Diet: Regular )  AMBULATORY STATUS COMMUNICATION OF NEEDS Skin   Extensive Assist Verbally Surgical wounds, Wound Vac(Incision: Right Hip, provena wound vac. )                       Personal Care Assistance Level of Assistance  Bathing,  Feeding, Dressing Bathing Assistance: Limited assistance Feeding assistance: Independent Dressing Assistance: Limited assistance     Functional Limitations Info  Sight, Hearing, Speech Sight Info: Adequate Hearing Info: Adequate Speech Info: Adequate    SPECIAL CARE FACTORS FREQUENCY  PT (By licensed PT), OT (By licensed OT)     PT Frequency: (5) OT Frequency: (5)            Contractures      Additional Factors Info  Code Status, Allergies Code Status Info: (Full Code. ) Allergies Info: (Allopurinol, Atenolol, Atorvastatin, Ramipril, Rosuvastatin, Simvastatin, Valsartan, Cardizem Diltiazem)           Current Medications (12/13/2017):  This is the current hospital active medication list Current Facility-Administered Medications  Medication Dose Route Frequency Provider Last Rate Last Dose  . 0.9 %  sodium chloride infusion  500 mL Intravenous Continuous Gunnar Bulla, MD 25 mL/hr at 12/13/17 1231 1,000 mL at 12/13/17 1231  . tranexamic acid (CYKLOKAPRON) 1,000 mg in sodium chloride 0.9 % 100 mL IVPB  1,000 mg Intravenous To OR Hessie Knows, MD         Discharge Medications: Please see discharge summary for a list of discharge medications.  Relevant Imaging Results:  Relevant Lab Results:   Additional Information (SSN: [id number])  Roberta Angell, Veronia Beets, LCSW

## 2017-12-13 NOTE — Anesthesia Procedure Notes (Signed)
Spinal  Patient location during procedure: OR Start time: 12/13/2017 3:07 PM End time: 12/13/2017 3:12 PM Staffing Resident/CRNA: Nelda Marseille, CRNA Performed: resident/CRNA  Preanesthetic Checklist Completed: patient identified, site marked, surgical consent, pre-op evaluation, timeout performed, IV checked, risks and benefits discussed and monitors and equipment checked Spinal Block Patient position: sitting Prep: Betadine Patient monitoring: heart rate, continuous pulse ox, blood pressure and cardiac monitor Approach: midline Location: L4-5 Injection technique: single-shot Needle Needle type: [place] and Introducer  Needle gauge: 25 G Needle length: 9 cm Assessment Sensory level: T10 Additional Notes Negative paresthesia. Negative blood return. Positive free-flowing CSF. Expiration date of kit checked and confirmed. Patient tolerated procedure well, without complications.

## 2017-12-13 NOTE — Op Note (Signed)
12/13/2017  4:32 PM  PATIENT:  Chase Cabrera  72 y.o. male  PRE-OPERATIVE DIAGNOSIS:  primary osteoarthritis of right hip  POST-OPERATIVE DIAGNOSIS:  primary osteoarthritis of right hip  PROCEDURE:  Procedure(s): TOTAL HIP ARTHROPLASTY ANTERIOR APPROACH (Right)  SURGEON: Laurene Footman, MD  ASSISTANTS: None  ANESTHESIA:   spinal  EBL:  Total I/O In: 200 [I.V.:200] Out: 550 [Urine:300; Blood:250]  BLOOD ADMINISTERED:none  DRAINS: none   LOCAL MEDICATIONS USED:  MARCAINE     SPECIMEN:  Source of Specimen:  Right femoral head  DISPOSITION OF SPECIMEN:  PATHOLOGY  COUNTS:  YES  TOURNIQUET:  * No tourniquets in log *  IMPLANTS: Medacta AMIS 6 lat stem, 52 mm Mpact cup DM with liner, S 28 mm metal head  DICTATION: .Dragon Dictation   The patient was brought to the operating room and after spinal anesthesia was obtained patient was placed on the operative table with the ipsilateral foot into the Medacta attachment, contralateral leg on a well-padded table. C-arm was brought in and preop template x-ray taken. After prepping and draping in usual sterile fashion appropriate patient identification and timeout procedures were completed. Anterior approach to the hip was obtained and centered over the greater trochanter and TFL muscle. The subcutaneous tissue was incised hemostasis being achieved by electrocautery. TFL fascia was incised and the muscle retracted laterally deep retractor placed. The lateral femoral circumflex vessels were identified and ligated. The anterior capsule was exposed and a capsulotomy performed. The neck was identified and a femoral neck cut carried out with a saw. The head was removed without difficulty and showed sclerotic femoral head and acetabulum. Reaming was carried out to 52 mm and a 52 mm cup trial gave appropriate tightness to the acetabular component a 52 cup was impacted into position. The leg was then externally rotated and ischiofemoral and  patellofemoral releases carried out. The femur was sequentially broached to a size 6, size 6 lateralized trials were placed and the final components chosen. The 6 lateralized stem was inserted along with a metal S 28 mm head and 52 mm liner. The hip was reduced and was stable the wound was thoroughly irrigated with fibrillar placed on the posterior capsule and medial neck. The deep fascia was closed using a heavy Quill after infiltration of 30 cc of quarter percent Sensorcaine with epinephrine. 3-0 v-loc subcutaneously followed by skin staples and incisional wound VAC  PLAN OF CARE: Admit to inpatient

## 2017-12-13 NOTE — Anesthesia Post-op Follow-up Note (Signed)
Anesthesia QCDR form completed.        

## 2017-12-13 NOTE — Transfer of Care (Signed)
Immediate Anesthesia Transfer of Care Note  Patient: Chase Cabrera  Procedure(s) Performed: TOTAL HIP ARTHROPLASTY ANTERIOR APPROACH (Right )  Patient Location: PACU  Anesthesia Type:Spinal  Level of Consciousness: drowsy  Airway & Oxygen Therapy: Patient Spontanous Breathing and Patient connected to face mask oxygen  Post-op Assessment: Report given to RN and Post -op Vital signs reviewed and stable  Post vital signs: Reviewed and stable  Last Vitals:  Vitals Value Taken Time  BP 105/52 12/13/2017  4:33 PM  Temp 36.7 C 12/13/2017  4:33 PM  Pulse 50 12/13/2017  4:33 PM  Resp 22 12/13/2017  4:33 PM  SpO2 97 % 12/13/2017  4:33 PM  Vitals shown include unvalidated device data.  Last Pain:  Vitals:   12/13/17 1633  TempSrc: Tympanic  PainSc:          Complications: No apparent anesthesia complications

## 2017-12-13 NOTE — H&P (Signed)
Reviewed paper H+P, will be scanned into chart. No changes noted.  

## 2017-12-14 ENCOUNTER — Inpatient Hospital Stay: Payer: Medicare Other

## 2017-12-14 ENCOUNTER — Encounter: Payer: Self-pay | Admitting: Orthopedic Surgery

## 2017-12-14 DIAGNOSIS — A419 Sepsis, unspecified organism: Secondary | ICD-10-CM

## 2017-12-14 DIAGNOSIS — J9601 Acute respiratory failure with hypoxia: Secondary | ICD-10-CM

## 2017-12-14 LAB — BLOOD GAS, ARTERIAL
ACID-BASE EXCESS: 3.2 mmol/L — AB (ref 0.0–2.0)
BICARBONATE: 26.9 mmol/L (ref 20.0–28.0)
FIO2: 0.36
O2 Saturation: 96.7 %
PATIENT TEMPERATURE: 38.1
pCO2 arterial: 37 mmHg (ref 32.0–48.0)
pH, Arterial: 7.45 (ref 7.350–7.450)
pO2, Arterial: 88 mmHg (ref 83.0–108.0)

## 2017-12-14 LAB — BASIC METABOLIC PANEL
ANION GAP: 9 (ref 5–15)
BUN: 14 mg/dL (ref 6–20)
CALCIUM: 8.3 mg/dL — AB (ref 8.9–10.3)
CHLORIDE: 100 mmol/L — AB (ref 101–111)
CO2: 24 mmol/L (ref 22–32)
CREATININE: 1.06 mg/dL (ref 0.61–1.24)
GFR calc non Af Amer: >60 mL/min (ref >60)
Glucose, Bld: 279 mg/dL — ABNORMAL HIGH (ref 65–99)
Potassium: 3.6 mmol/L (ref 3.5–5.1)
SODIUM: 133 mmol/L — AB (ref 135–145)

## 2017-12-14 LAB — BASIC METABOLIC PANEL WITH GFR
Anion gap: 10 (ref 5–15)
BUN: 13 mg/dL (ref 6–20)
CO2: 24 mmol/L (ref 22–32)
Calcium: 8.7 mg/dL — ABNORMAL LOW (ref 8.9–10.3)
Chloride: 100 mmol/L — ABNORMAL LOW (ref 101–111)
Creatinine, Ser: 0.93 mg/dL (ref 0.61–1.24)
GFR calc Af Amer: >60 mL/min (ref >60)
GFR calc non Af Amer: >60 mL/min (ref >60)
Glucose, Bld: 224 mg/dL — ABNORMAL HIGH (ref 65–99)
Potassium: 4 mmol/L (ref 3.5–5.1)
Sodium: 134 mmol/L — ABNORMAL LOW (ref 135–145)

## 2017-12-14 LAB — BLOOD GAS, VENOUS
ACID-BASE EXCESS: 2.2 mmol/L — AB (ref 0.0–2.0)
Bicarbonate: 26.5 mmol/L (ref 20.0–28.0)
O2 SAT: 78.4 %
PCO2 VEN: 39 mmHg — AB (ref 44.0–60.0)
Patient temperature: 37
pH, Ven: 7.44 — ABNORMAL HIGH (ref 7.250–7.430)
pO2, Ven: 41 mmHg (ref 32.0–45.0)

## 2017-12-14 LAB — URINALYSIS, COMPLETE (UACMP) WITH MICROSCOPIC
Bacteria, UA: NONE SEEN
Bilirubin Urine: NEGATIVE
GLUCOSE, UA: NEGATIVE mg/dL
HGB URINE DIPSTICK: NEGATIVE
Ketones, ur: NEGATIVE mg/dL
Leukocytes, UA: NEGATIVE
NITRITE: NEGATIVE
PH: 5 (ref 5.0–8.0)
Protein, ur: NEGATIVE mg/dL
RBC / HPF: NONE SEEN RBC/hpf (ref 0–5)
SPECIFIC GRAVITY, URINE: 1.006 (ref 1.005–1.030)
Squamous Epithelial / LPF: NONE SEEN

## 2017-12-14 LAB — GLUCOSE, CAPILLARY
GLUCOSE-CAPILLARY: 241 mg/dL — AB (ref 65–99)
GLUCOSE-CAPILLARY: 213 mg/dL — AB (ref 65–99)
Glucose-Capillary: 260 mg/dL — ABNORMAL HIGH (ref 65–99)

## 2017-12-14 LAB — CBC
HEMATOCRIT: 33.8 % — AB (ref 40.0–52.0)
HEMATOCRIT: 34.2 % — AB (ref 40.0–52.0)
HEMOGLOBIN: 11.5 g/dL — AB (ref 13.0–18.0)
HEMOGLOBIN: 11.7 g/dL — AB (ref 13.0–18.0)
MCH: 34.2 pg — ABNORMAL HIGH (ref 26.0–34.0)
MCH: 33.9 pg (ref 26.0–34.0)
MCHC: 33.9 g/dL (ref 32.0–36.0)
MCHC: 34.2 g/dL (ref 32.0–36.0)
MCV: 101.1 fL — ABNORMAL HIGH (ref 80.0–100.0)
MCV: 99.1 fL (ref 80.0–100.0)
Platelets: 222 10*3/uL (ref 150–440)
Platelets: 204 10*3/uL (ref 150–440)
RBC: 3.35 MIL/uL — ABNORMAL LOW (ref 4.40–5.90)
RBC: 3.45 MIL/uL — ABNORMAL LOW (ref 4.40–5.90)
RDW: 13.9 % (ref 11.5–14.5)
RDW: 13.6 % (ref 11.5–14.5)
WBC: 8.8 10*3/uL (ref 3.8–10.6)
WBC: 8.6 10*3/uL (ref 3.8–10.6)

## 2017-12-14 LAB — PROTIME-INR
INR: 1.15
Prothrombin Time: 14.6 seconds (ref 11.4–15.2)

## 2017-12-14 LAB — APTT: aPTT: 30 s (ref 24–36)

## 2017-12-14 MED ORDER — VANCOMYCIN HCL IN DEXTROSE 1-5 GM/200ML-% IV SOLN
1000.0000 mg | Freq: Once | INTRAVENOUS | Status: AC
  Administered 2017-12-14: 1000 mg via INTRAVENOUS
  Filled 2017-12-14: qty 200

## 2017-12-14 MED ORDER — VANCOMYCIN HCL IN DEXTROSE 1-5 GM/200ML-% IV SOLN
1000.0000 mg | Freq: Two times a day (BID) | INTRAVENOUS | Status: DC
  Administered 2017-12-15 – 2017-12-16 (×3): 1000 mg via INTRAVENOUS
  Filled 2017-12-14 (×5): qty 200

## 2017-12-14 MED ORDER — IPRATROPIUM-ALBUTEROL 0.5-2.5 (3) MG/3ML IN SOLN: 3.0000 mL | Freq: Four times a day (QID) | RESPIRATORY_TRACT | Status: DC | PRN

## 2017-12-14 MED ORDER — ACETAMINOPHEN 650 MG RE SUPP
650.0000 mg | RECTAL | Status: DC | PRN
  Administered 2017-12-14: 650 mg via RECTAL
  Filled 2017-12-14: qty 1

## 2017-12-14 MED ORDER — FUROSEMIDE 10 MG/ML IJ SOLN
40.0000 mg | INTRAMUSCULAR | Status: AC
  Administered 2017-12-14: 40 mg via INTRAVENOUS
  Filled 2017-12-14: qty 4

## 2017-12-14 MED ORDER — PIPERACILLIN-TAZOBACTAM 3.375 G IVPB
3.3750 g | Freq: Three times a day (TID) | INTRAVENOUS | Status: DC
  Administered 2017-12-14 – 2017-12-16 (×5): 3.375 g via INTRAVENOUS
  Filled 2017-12-14 (×6): qty 50

## 2017-12-14 MED ORDER — SODIUM CHLORIDE 0.9 % IV SOLN
INTRAVENOUS | Status: DC
  Administered 2017-12-14: 20:00:00 via INTRAVENOUS

## 2017-12-14 NOTE — Progress Notes (Signed)
   Subjective: 1 Day Post-Op Procedure(s) (LRB): TOTAL HIP ARTHROPLASTY ANTERIOR APPROACH (Right) Patient reports pain as moderate.   Patient is well, and has had no acute complaints or problems Denies any CP, SOB, ABD pain. We will continue therapy today.  Plan is to go Skilled nursing facility after hospital stay.  Objective: Vital signs in last 24 hours: Temp:  [97 F (36.1 C)-101.1 F (38.4 C)] 98.3 F (36.8 C) (04/03 0758) Pulse Rate:  [49-67] 61 (04/03 0758) Resp:  [16-23] 20 (04/03 0758) BP: (100-177)/(48-73) 163/61 (04/03 0758) SpO2:  [93 %-98 %] 95 % (04/03 0758) Weight:  [88.9 kg (196 lb)] 88.9 kg (196 lb) (04/02 1202)  Intake/Output from previous day: 04/02 0701 - 04/03 0700 In: 3220 [I.V.:1195] Out: 1250 [Urine:1000; Blood:250] Intake/Output this shift: No intake/output data recorded.  No results for input(s): HGB in the last 72 hours. No results for input(s): WBC, RBC, HCT, PLT in the last 72 hours. No results for input(s): NA, K, CL, CO2, BUN, CREATININE, GLUCOSE, CALCIUM in the last 72 hours. Recent Labs    12/13/17 1232 12/14/17 0448  INR 1.12 1.15    EXAM General - Patient is Alert, Appropriate and Oriented Extremity - Neurovascular intact Sensation intact distally Intact pulses distally Dorsiflexion/Plantar flexion intact No cellulitis present Compartment soft Dressing - dressing C/D/I and wound vac intact with out drainage Motor Function - intact, moving foot and toes well on exam.   Past Medical History:  Diagnosis Date  . Arthritis   . Atrial fibrillation ([place])   . CAD (coronary artery disease)    6 STENTS  . CHF (congestive heart failure) ([place])   . Chicken pox   . Colon polyp   . Corneal dystrophy    bilateral  . Crohn's disease ([place])   . Diabetes ([place]) 2002  . Dysrhythmia    A-FIB AND PAF  . GERD (gastroesophageal reflux disease)   . Gout   . History of kidney stones   . History of shingles   . Hyperlipemia   . Hypertension    . Kidney stones   . Myocardial infarction ([place])    x4, last one in 2010  . Peripheral neuropathy   . Peripheral neuropathy   . Peripheral neuropathy   . PUD (peptic ulcer disease)   . Renal artery stenosis ([place])   . Renal artery stenosis ([place])   . Salzmann's nodular dystrophy 2012  . Sleep apnea   . Spinal stenosis     Assessment/Plan:   1 Day Post-Op Procedure(s) (LRB): TOTAL HIP ARTHROPLASTY ANTERIOR APPROACH (Right) Active Problems:   Osteoarthritis of right hip  Estimated body mass index is 29.8 kg/m as calculated from the following:   Height as of this encounter: 5' 8"  (1.727 m).   Weight as of this encounter: 88.9 kg (196 lb). Advance diet Up with therapy  Needs BM VSS Labs pending for this am CM to assist with discharge to SNF     DVT Prophylaxis - Plavix, coumadin Weight-Bearing as tolerated to right leg   T. Rachelle Hora, PA-C [place] 12/14/2017, 8:59 AM

## 2017-12-14 NOTE — Progress Notes (Signed)
Inpatient Diabetes Program Recommendations  AACE/ADA: New Consensus Statement on Inpatient Glycemic Control (2015)  Target Ranges:  Prepandial:   less than 140 mg/dL      Peak postprandial:   less than 180 mg/dL (1-2 hours)      Critically ill patients:  140 - 180 mg/dL   Lab Results  Component Value Date   GLUCAP 241 (H) 12/14/2017   HGBA1C 6.5 (H) 10/03/2017    Review of Glycemic ControlResults for ROLLINS, WRIGHTSON (MRN [id number]) as of 12/14/2017 11:51  Ref. Range 12/13/2017 12:00 12/13/2017 16:42 12/13/2017 19:01 12/13/2017 21:00 12/14/2017 07:57  Glucose-Capillary Latest Ref Range: 65 - 99 mg/dL 263 (H) 168 (H) 254 (H) 223 (H) 241 (H)   Diabetes history: Type 2 DM Outpatient Diabetes medications:  Humalog 15-30 units tid with meals, Levemir 90 units q HS, Metformin 1000 mg bid Current orders for Inpatient glycemic control:  Novolog moderate tid with meals, Novolog 4 units tid with meals, Levemir 90 units q HS  Inpatient Diabetes Program Recommendations:   May consider increasing Novolog meal coverage to 10 units tid with meals. Will follow.   Thanks,  Adah Perl, RN, BC-ADM Inpatient Diabetes Coordinator Pager [phone] (8a-5p)

## 2017-12-14 NOTE — Consult Note (Signed)
Name: Chase Cabrera MRN: [id number] DOB: [birth year]    ADMISSION DATE:  12/13/2017 CONSULTATION DATE: 12/14/2017  REFERRING MD : Dr. Rudene Christians   CHIEF COMPLAINT: AMS  BRIEF PATIENT DESCRIPTION:  72 yo male admitted to orthopedic unit 04/2 for an elective right hip arthoplasty developed acute encephalopathy 04/3 concerning for possible stroke transferred to stepdown unit for closer observation  SIGNIFICANT EVENTS/STUDIES  04/2-Pt admitted to orthopedic unit s/p elective right hip arthoplasty  04/3-CT Head-No acute intracranial abnormality identified. Stable mild chronic  microvascular ischemic changes and parenchymal volume loss of the brain.  Mild paranasal sinus disease. 04/3-Pt developed acute encephalopathy code stroke initiated pt transferred to stepdown unit.  Tele Neurology consulted concluded presentation is not suggestive of large vessel occlusive disease. Thrombectomy would not be recommended and pt is not a candidate for Altepase due to recent major surgery.  HISTORY OF PRESENT ILLNESS:   This is a 72 yo male with a PMH of OSA, Salzmann's Nodular Dystrophy, Renal Artery Stenosis, Peptic Ulcer Disease, Peripheral Neuropathy, MI x4, HTN, Hyperlipidemia, Crohn's Disease, CHF, CAD, Atrial Fibrillation (on Coumadin), and Arthritis.  He presented to [facility] on 04/2 for an elective right hip arthoplasty due to osteoarthritis.  He is currently POD #1 today, while working with physical therapy at 1630 on 12/14/17 he became lethargic, and noted to be febrile with a temp of 103 F.  His last known well time was 1400 per Conservation officer, historic buildings.  It was also noted the pt had left sided weakness, therefore rapid response called then a code stroke initiated and pt transferred to stepdown unit.  Tele Neurology consulted and CT Head ordered.  He has a known hx of atrial fibrillation and was on Coumadin, however several days prior to surgery he was instructed to stop taking coumadin he is currently taking plavix INR today  1.15.  Upon arrival to the stepdown unit pt evaluated by tele neurologist Dr. Tommi Rumps who concluded presentation is not suggestive of large vessel occlusive disease. Thrombectomy would not be recommended and pt is not a candidate for Altepase due to recent major surgery.  Dr. Tommi Rumps recommended daily antithrombotics to be continued and consult neurology for routine follow-up.    PAST MEDICAL HISTORY :   has a past medical history of Arthritis, Atrial fibrillation ([place]), CAD (coronary artery disease), CHF (congestive heart failure) ([place]), Chicken pox, Colon polyp, Corneal dystrophy, Crohn's disease ([place]), Diabetes ([place]) (2002), Dysrhythmia, GERD (gastroesophageal reflux disease), Gout, History of kidney stones, History of shingles, Hyperlipemia, Hypertension, Kidney stones, Myocardial infarction [facility]), Peripheral neuropathy, Peripheral neuropathy, Peripheral neuropathy, PUD (peptic ulcer disease), Renal artery stenosis ([place]), Renal artery stenosis ([place]), Salzmann's nodular dystrophy (2012), Sleep apnea, and Spinal stenosis.  has a past surgical history that includes GALLBLADER; Cardiac catheterization; Coronary angioplasty; Coronary angioplasty with stent (03/28/2015); Cardiac electrophysiology study and ablation; Cardioversion; Back surgery; Total hip arthroplasty (Left, 11/11/2015); Rotator cuff repair (Right, 2006); Renal artery stent (Right); foot and ankle repair (Right, 2005); Tonsillectomy; Knee arthroscopy (Right); Colonoscopy with propofol (N/A, 04/09/2016); Fracture surgery; APPLICATION VERTERBRAL DEFECT PROSTHETIC (05/01/2013); ARTHRODESIS ANTERIOR LUMBAR SPINE (05/01/2013); Cholecystectomy; Eye surgery; CORNEAL EYE SURGERY (Bilateral, 07/28/11    09/22/2011); LUMBAR SPINE FUSION ONE LEVEL (05/03/2013); TRANSCATH PLACEMENT INTRAVASCULAR STENT LEG (03/2015); Joint replacement; Coronary artery bypass graft; Cardiac surgery; Ablation (2012); and Total hip arthroplasty (Right, 12/13/2017). Prior to Admission  medications   Medication Sig Start Date End Date Taking? Authorizing Provider  amLODipine (NORVASC) 10 MG tablet TAKE 1 TABLET BY MOUTH ONCE DAILY Patient taking  differently: Take 10 mg by mouth at bedtime 09/16/17  Yes McLean-Scocuzza, Nino Glow, MD  azelastine (ASTELIN) 0.1 % nasal spray Place 2 sprays into both nostrils 2 (two) times daily. Use in each nostril as directed Patient taking differently: Place 2 sprays into both nostrils daily. Use in each nostril as directed 09/09/16  Yes Cook, Jayce G, DO  carvedilol (COREG) 25 MG tablet Take 1 tablet (25 mg total) by mouth 2 (two) times daily with a meal. 10/06/17  Yes McLean-Scocuzza, Nino Glow, MD  Cholecalciferol 50000 units TABS Take 1 tablet by mouth once a week. Patient taking differently: Take 50,000 Units by mouth every Saturday.  10/07/17  Yes McLean-Scocuzza, Nino Glow, MD  clopidogrel (PLAVIX) 75 MG tablet Take 75 mg by mouth daily.   Yes [provider]  colchicine 0.6 MG tablet TAKE 1 TABLET BY MOUTH ONCE DAILY Patient taking differently: TAKE 0.6 MG BY MOUTH ONCE DAILY 07/12/17  Yes Leone Haven, MD  diclofenac sodium (VOLTAREN) 1 % GEL Apply 2 g topically 4 (four) times daily as needed (for pain).    Yes [provider]  dicyclomine (BENTYL) 10 MG capsule Take 10 mg by mouth 3 (three) times daily before meals.    Yes [provider]  gabapentin (NEURONTIN) 300 MG capsule take 1 capsule by mouth three times a day Patient taking differently: Take 600 mg by mouth 3 times daily 04/19/16  Yes Cook, Jayce G, DO  hydrALAZINE (APRESOLINE) 100 MG tablet TAKE 1 TABLET BY MOUTH THREE TIMES A DAY Patient taking differently: TAKE 100 MG BY MOUTH THREE TIMES A DAY 11/30/17  Yes McLean-Scocuzza, Nino Glow, MD  hydrocortisone 2.5 % cream Apply topically 2 (two) times daily. Patient taking differently: Apply 1 application topically daily as needed (rash behind ear).  10/03/17  Yes McLean-Scocuzza, Nino Glow, MD  insulin lispro  (HUMALOG) 100 UNIT/ML KiwkPen Inject 15-30 units SQ up to three times daily per sliding scale 05/03/16  Yes [provider]  isosorbide mononitrate (IMDUR) 120 MG 24 hr tablet TAKE 1 TABLET BY MOUTH EVERY DAY Patient taking differently: TAKE 120 MG BY MOUTH EVERY DAY 10/07/17  Yes McLean-Scocuzza, Nino Glow, MD  LEVEMIR FLEXTOUCH 100 UNIT/ML Pen Inject 90 Units into the skin daily at 10 pm.  08/24/16  Yes [provider]  mesalamine (LIALDA) 1.2 g EC tablet Take 2.4 g by mouth daily with breakfast. [facility]   Yes [provider]  metFORMIN (GLUCOPHAGE) 1000 MG tablet Take 1,000 mg by mouth 2 (two) times daily.  01/01/15  Yes [provider]  Multiple Vitamin (MULTI-VITAMINS) TABS Take 1 tablet by mouth daily. 04/15/09  Yes [provider]  omeprazole (PRILOSEC) 40 MG capsule Take 40 mg by mouth 2 (two) times daily.    Yes [provider]  oxyCODONE-acetaminophen (PERCOCET/ROXICET) 5-325 MG tablet Take 1-2 tablets by mouth every 6 (six) hours as needed for severe pain.    Yes [provider]  pravastatin (PRAVACHOL) 40 MG tablet TAKE 1 TABLET BY MOUTH ONCE DAILY Patient taking differently: TAKE 40 MG BY MOUTH ONCE DAILY 10/07/17  Yes McLean-Scocuzza, Nino Glow, MD  probenecid (BENEMID) 500 MG tablet Take 500 mg by mouth 2 (two) times daily.  01/09/15  Yes [provider]  vitamin C (ASCORBIC ACID) 500 MG tablet Take 500 mg by mouth daily.    Yes [provider]  Vitamin E 400 units TABS Take 400 Units by mouth 2 (two) times daily.   Yes  [provider]  warfarin (COUMADIN) 3 MG tablet 1 tablet  Monday, Wednesday, Friday. Patient taking differently: Take 3 mg by mouth See admin instructions. Take 3 mg by mouth daily on Sundays and Mondays 11/16/16  Yes Lacinda Axon, Jayce G, DO  warfarin (COUMADIN) 4 MG tablet 1 tablet Tuesday, Thursday, Saturday, and Sunday. Patient taking differently: Take 4 mg by mouth See admin instructions. Take 4  mg by mouth daily on Tuesday, Wednesday, Thursday, Friday and Saturday 10/03/17  Yes McLean-Scocuzza, Nino Glow, MD  nitroGLYCERIN (NITROSTAT) 0.4 MG SL tablet Place 1 tablet (0.4 mg total) under the tongue every 5 (five) minutes x 3 doses as needed. For chest pain.  Call 911 if no relief after 3 tablets 10/03/17 11/02/21  McLean-Scocuzza, Nino Glow, MD   Allergies  Allergen Reactions  . Allopurinol Diarrhea, Other (See Comments) and Nausea And Vomiting    Other Reaction: GI Upset  . Atenolol Other (See Comments)    Other Reaction: bradycardia  . Atorvastatin Other (See Comments)    Other Reaction: muscle aches  . Ramipril Rash  . Rosuvastatin Other (See Comments)    Muscle aches  . Simvastatin Other (See Comments)    Other Reaction: MUSCLE ACHES (Zocor)  . Valsartan Other (See Comments)    Other Reaction: facial swelling  . Cardizem [Diltiazem] Rash    FAMILY HISTORY:  family history includes Arthritis in his mother; CVA in his father; Colon cancer in his sister; Diabetes in his brother; Lung cancer in his mother; Stomach cancer in his sister; Stroke in his father. SOCIAL HISTORY:  reports that he quit smoking about 55 years ago. His smoking use included cigarettes. He has a 3.00 pack-year smoking history. He quit smokeless tobacco use about 49 years ago. His smokeless tobacco use included chew. He reports that he does not drink alcohol or use drugs.  REVIEW OF SYSTEMS: Positives in BOLD  Gen: Denies fever, chills, weight change, fatigue, night sweats HEENT: Denies blurred vision, double vision, hearing loss, tinnitus, sinus congestion, rhinorrhea, sore throat, neck stiffness, dysphagia PULM: Denies shortness of breath, cough, sputum production, hemoptysis, wheezing CV: Denies chest pain, edema, orthopnea, paroxysmal nocturnal dyspnea, palpitations GI: Denies abdominal pain, nausea, vomiting, diarrhea, hematochezia, melena, constipation, change in bowel habits GU: Denies dysuria, hematuria,  polyuria, oliguria, urethral discharge Endocrine: Denies hot or cold intolerance, polyuria, polyphagia or appetite change Derm: Denies rash, dry skin, scaling or peeling skin change Heme: Denies easy bruising, bleeding, bleeding gums Neuro: Denies headache, numbness, weakness, slurred speech, loss of memory or consciousness  SUBJECTIVE:  No complaints at this time   VITAL SIGNS: Temp:  [98 F (36.7 C)-103 F (39.4 C)] 100.6 F (38.1 C) (04/03 1717) Pulse Rate:  [56-71] 69 (04/03 1717) Resp:  [19-26] 26 (04/03 1717) BP: (133-184)/(56-73) 184/67 (04/03 1717) SpO2:  [93 %-96 %] 93 % (04/03 1717)  PHYSICAL EXAMINATION: General: well developed, well nourished male, NAD  Neuro: oriented to self, lethargic, follows commands, bilateral hand grip moderate, bilateral dorsiflexion weak, PERRLA  HEENT: supple, no JVD  Cardiovascular: nsr, rrr, no R/G Lungs: faint crackles throughout, mildly tachypneic  Abdomen: +BS x4, soft, non tender, non distended  Musculoskeletal: normal bulk and tone, no edema  Skin: right hip wound vac in place dry and intact   Recent Labs  Lab 12/14/17 1108  NA 133*  K 3.6  CL 100*  CO2 24  BUN 14  CREATININE 1.06  GLUCOSE 279*   Recent Labs  Lab 12/14/17 1016 12/14/17 1832  HGB 11.5* 11.7*  HCT 33.8* 34.2*  WBC 8.8 8.6  PLT 222 204   Dg Chest 1 View  Result Date: 12/14/2017 CLINICAL DATA:  Code stroke.  Febrile. EXAM: CHEST  1 VIEW COMPARISON:  Chest radiograph October 12, 2017, PET CT December 08, 2017 FINDINGS: The cardiac silhouette is moderately enlarged, increased from prior examination. Pulmonary vascular congestion interstitial prominence increased from prior examination. Multiple pulmonary nodules/granulomas. Status post median sternotomy for CABG, coronary artery stent. No pneumothorax. Osteopenia. Soft tissue planes are unchanged. IMPRESSION: Worsening cardiomegaly with pulmonary vascular congestion/interstitial prominence compatible with  pulmonary edema. Multiple pulmonary nodules, please see PET CT November 30, 2017 for dedicated findings. Electronically Signed   By: Elon Alas M.D.   On: 12/14/2017 18:41   Ct Head Wo Contrast  Result Date: 12/14/2017 CLINICAL DATA:  71 y/o M; increased blood pressure and decreased level of consciousness. Inability to swallow. Tremor in arms. EXAM: CT HEAD WITHOUT CONTRAST TECHNIQUE: Contiguous axial images were obtained from the base of the skull through the vertex without intravenous contrast. COMPARISON:  10/11/2014 MRI of the head. FINDINGS: Brain: No evidence of acute infarction, hemorrhage, hydrocephalus, extra-axial collection or mass lesion/mass effect. Stable mild chronic microvascular ischemic changes and parenchymal volume loss of the brain. Vascular: Calcific atherosclerosis of the carotid siphons. No hyperdense vessel. Skull: Normal. Negative for fracture or focal lesion. Sinuses/Orbits: Mild diffuse paranasal sinus mucosal thickening and partial opacification of left anterior ethmoid air cells. Normal aeration of mastoid air cells. Bilateral intra-ocular lens replacement. Other: None. IMPRESSION: 1. No acute intracranial abnormality identified. 2. Stable mild chronic microvascular ischemic changes and parenchymal volume loss of the brain. 3. Mild paranasal sinus disease. Electronically Signed   By: Kristine Garbe M.D.   On: 12/14/2017 18:06   Dg Hip Operative Unilat W Or W/o Pelvis Right  Result Date: 12/13/2017 CLINICAL DATA:  Status post right total hip joint replacing using an anterior approach. Reported fluoro time is 12 seconds. EXAM: OPERATIVE right HIP (WITH PELVIS IF PERFORMED) 2 VIEWS TECHNIQUE: Fluoroscopic spot image(s) were submitted for interpretation post-operatively. COMPARISON:  CT scan of the abdomen and pelvis of November 16, 2017 which included coronal and sagittal reconstructions through the pelvis. FINDINGS: The patient has undergone anterior approach right hip  joint prosthesis placement. The distal aspect of the femoral component is not included in the field of view. No immediate postprocedure complication is observed. IMPRESSION: Patient has undergone anterior approach right total hip joint prosthesis placement. Electronically Signed   By: David  Martinique M.D.   On: 12/13/2017 16:27   Dg Hip Unilat W Or W/o Pelvis 2-3 Views Right  Result Date: 12/13/2017 CLINICAL DATA:  Status post right hip replacement today. EXAM: DG HIP (WITH OR WITHOUT PELVIS) 2-3V RIGHT COMPARISON:  None. FINDINGS: Right total hip arthroplasty is in place. The device is located. No fracture. Surgical staples and drain noted. IMPRESSION: Status post right hip replacement.  No acute finding. Electronically Signed   By: Inge Rise M.D.   On: 12/13/2017 17:10    ASSESSMENT / PLAN: Acute encephalopathy secondary to sepsis vs. CVA Acute respiratory failure secondary to pulmonary edema  Elective Right Hip Arthoplasty secondary to Osteoarthritis POD #1 Malignant HTN  Sepsis although source unknown  Diabetes Mellitus Hx: OSA (does not wear CPAP), CHF, Hyperlipidemia, GERD, Atrial Fibrillation  P: Supplemental O2 for dyspnea and/or hypoxia  IV lasix x1 dose Repeat CXR in am  Continue antithrombotics per tele neurology recommendations  US Carotid pending  Neurology  consulted appreciate input  Continue neuro checks per code stroke protocol  Avoid sedating medications  Continuous telemetry monitoring  Trend WBC and monitor fever curve Trend lactic acid and PCT Follow cultures Continue broad spectrum abx  Continuous telemetry monitoring  Allow for permissive HTN in setting of possible CVA Trend CBC Monitor for s/sx of bleeding  Transfuse for hgb <7 Trend BMP Replace electrolytes as indicated  Monitor UOP  Orthopedic Surgery primary   Marda Stalker, [place] Pager [phone] (please enter 7 digits) PCCM Consult Pager [phone] (please enter 7  digits)  [facility]

## 2017-12-14 NOTE — Progress Notes (Signed)
Patient's Temp noted to be 101.3. This nurse reassessed temp to be 103. PA [place] paged. Whole set of vitals done and found BP 184/67 at this time , patient noted to be breathing rapidly 26 per min but O2 Sats stable at 93 on 2 L O2. Patient responding to voice but not following commands or answering questions Patient also unable to swallow.  Rapid response was called followed by code stroke. Patient was transfer off the floor to CT. MD Rudene Christians paged and made aware of above.

## 2017-12-14 NOTE — Clinical Social Work Note (Signed)
Clinical Social Work Assessment  Patient Details  Name: Chase Cabrera MRN: [id number] Date of Birth: 1945/10/18  Date of referral:  12/14/17               Reason for consult:  Facility Placement                Permission sought to share information with:  [name] granted to share information::  Yes, Verbal Permission Granted  Name::      [name]   Relationship::     Contact Information:     Housing/Transportation Living arrangements for the past 2 months:  [place] of Information:  Patient, Spouse Patient Interpreter Needed:  None Criminal Activity/Legal Involvement Pertinent to Current Situation/Hospitalization:  No - Comment as needed Significant Relationships:  Spouse Lives with:  Spouse Do you feel safe going back to the place where you live?  Yes Need for family participation in patient care:  Yes (Comment)  Care giving concerns:  Patient lives in [place] with his wife [name] [phone].    Social Worker assessment / plan:  Holiday representative (CSW) received SNF consult. PT is recommending SNF. CSW met with patient and his wife [name] was at bedside. Patient was alert and oriented X4 and was sitting up in the chair at bedside. CSW introduced self and explained role of CSW department. Patient reported that he lives with his wife in [place]. CSW explained SNF process and that medicare requires a 3 night qualifying inpatient stay in a hospital in order to pay for SNF. Patient was admitted to inpatient on 12/13/17. Patient and his wife verbalized their understanding and are agreeable to SNF search in [place]. They requested [facility]. FL2 complete and faxed out.   CSW presented bed offers to patient and his wife. They chose [facility]. Plan is for patient to D/C to [facility] Friday 12/16/17 pending medical clearance. [name] is aware of above. CSW will continue to  follow and assist as needed.    Employment status:  Retired Forensic scientist:  Medicare PT Recommendations:  [name]  Patient/Family's Response to care:  Patient and his wife accepted bed offer from [facility].   Patient/Family's Understanding of and Emotional Response to Diagnosis, Current Treatment, and Prognosis:  Patient and his wife were very pleasant and thanked CSW for assistance.   Emotional Assessment Appearance:  Appears stated age Attitude/Demeanor/Rapport:    Affect (typically observed):  Accepting, Adaptable, Pleasant Orientation:  Oriented to Self, Oriented to Place, Oriented to  Time, Oriented to Situation Alcohol / Substance use:  Not Applicable Psych involvement (Current and /or in the community):  No (Comment)  Discharge Needs  Concerns to be addressed:  Discharge Planning Concerns Readmission within the last 30 days:  No Current discharge risk:  Dependent with Mobility Barriers to Discharge:  Continued Medical Work up   [employer], Veronia Beets, LCSW 12/14/2017, 1:50 PM

## 2017-12-14 NOTE — Clinical Social Work Placement (Signed)
   CLINICAL SOCIAL WORK PLACEMENT  NOTE  Date:  12/14/2017  Patient Details  Name: Chase Cabrera MRN: [id number] Date of Birth: 1946-08-05  Clinical Social Work is seeking post-discharge placement for this patient at the [place] level of care (*CSW will initial, date and re-position this form in  chart as items are completed):  Yes   Patient/family provided with [place] Work Department's list of facilities offering this level of care within the geographic area requested by the patient (or if unable, by the patient's family).  Yes   Patient/family informed of their freedom to choose among providers that offer the needed level of care, that participate in Medicare, Medicaid or managed care program needed by the patient, have an available bed and are willing to accept the patient.  Yes   Patient/family informed of [facility]'s ownership interest in [facility] and [facility], as well as of the fact that they are under no obligation to receive care at these facilities.  PASRR submitted to EDS on       PASRR number received on       Existing PASRR number confirmed on 12/13/17     FL2 transmitted to all facilities in geographic area requested by pt/family on 12/14/17     FL2 transmitted to all facilities within larger geographic area on       Patient informed that his/her managed care company has contracts with or will negotiate with certain facilities, including the following:        Yes   Patient/family informed of bed offers received.  Patient chooses bed at ([facility] )     Physician recommends and patient chooses bed at      Patient to be transferred to   on  .  Patient to be transferred to facility by       Patient family notified on   of transfer.  Name of family member notified:        PHYSICIAN       Additional Comment:    _______________________________________________ Linnaea Ahn, Veronia Beets, LCSW 12/14/2017, 1:49 PM

## 2017-12-14 NOTE — Consult Note (Signed)
Pharmacy Antibiotic Note  Chase Cabrera is a 72 y.o. male admitted on 12/13/2017 for an elective right hip arthroplasty. Pharmacy has been consulted for Zosyn and Vancomycin dosing for Sepsis.  Plan: Ke: 0.060   T1/2: 11.55  Vd: 62.23     Start Vancomycin 1g IV every 12 hours with 6 hour stack dosing. Calculated trough at Css is 15.26. Trough level ordered prior to 4th dose. Will monitor renal function and adjust dose as needed.   Start Zosyn 3.375 IV EI every 8 hours.   Height: 5' 8"  (172.7 cm) Weight: 196 lb (88.9 kg) IBW/kg (Calculated) : 68.4  Temp (24hrs), Avg:99.9 F (37.7 C), Min:98 F (36.7 C), Max:103 F (39.4 C)  Recent Labs  Lab 12/14/17 1016 12/14/17 1108  WBC 8.8  --   CREATININE  --  1.06    Estimated Creatinine Clearance: 67.2 mL/min (by C-G formula based on SCr of 1.06 mg/dL).    Allergies  Allergen Reactions  . Allopurinol Diarrhea, Other (See Comments) and Nausea And Vomiting    Other Reaction: GI Upset  . Atenolol Other (See Comments)    Other Reaction: bradycardia  . Atorvastatin Other (See Comments)    Other Reaction: muscle aches  . Ramipril Rash  . Rosuvastatin Other (See Comments)    Muscle aches  . Simvastatin Other (See Comments)    Other Reaction: MUSCLE ACHES (Zocor)  . Valsartan Other (See Comments)    Other Reaction: facial swelling  . Cardizem [Diltiazem] Rash    Antimicrobials this admission: 4/3 vancomycin  >> 4/3 Zosyn  >>   Dose adjustments this admission:  Microbiology results: 4/3 BCx: pending   Thank you for allowing pharmacy to be a part of this patient's care.  Pernell Dupre, PharmD, BCPS Clinical Pharmacist 12/14/2017 6:57 PM

## 2017-12-14 NOTE — Evaluation (Signed)
Physical Therapy Evaluation Patient Details Name: Chase Cabrera MRN: [id number] DOB: Mar 24, 1946 Today's Date: 12/14/2017   History of Present Illness  Pt admitted for R hip THR.   Clinical Impression  Pt is a pleasant 72 year old male who was admitted for R THR. Pt performs bed mobility with min/mod, transfers with min assist, and ambulation with min assist and RW. Pt demonstrates deficits with strength/endurance/mobility. Pt appears motivated to perform therapy. Would benefit from skilled PT to address above deficits and promote optimal return to PLOF; recommend transition to STR upon discharge from acute hospitalization.       Follow Up Recommendations SNF    Equipment Recommendations  None recommended by PT    Recommendations for Other Services       Precautions / Restrictions Precautions Precautions: Fall;Anterior Hip Precaution Booklet Issued: No Restrictions Weight Bearing Restrictions: Yes RLE Weight Bearing: Weight bearing as tolerated      Mobility  Bed Mobility Overal bed mobility: Needs Assistance Bed Mobility: Supine to Sit     Supine to sit: Min assist;Mod assist     General bed mobility comments: needs assist bringing B LEs off bed and scooting out towards EOB. ONce seated at edge, sat for a few minutes as he was slightly dizzy  Transfers Overall transfer level: Needs assistance Equipment used: Rolling walker (2 wheeled) Transfers: Sit to/from Stand Sit to Stand: Min assist         General transfer comment: upright posture with slow transfer noted. RW used  Ambulation/Gait Ambulation/Gait assistance: Min Wellsite geologist (Feet): 3 Feet Assistive device: Rolling walker (2 wheeled) Gait Pattern/deviations: Step-to pattern     General Gait Details: slow antalgic gait noted to recliner. RW used for safety. Fatigues quickly  Financial trader [place] (Stroke Patients Only)       Balance  Overall balance assessment: Needs assistance Sitting-balance support: Feet supported Sitting balance-Leahy Scale: Good     Standing balance support: Bilateral upper extremity supported Standing balance-Leahy Scale: Good                               Pertinent Vitals/Pain Pain Assessment: 0-10 Pain Score: 5  Pain Location: R hip Pain Descriptors / Indicators: Operative site guarding Pain Intervention(s): Limited activity within patient's tolerance;Ice applied;Premedicated before session    Home Living Family/patient expects to be discharged to:: Private residence Living Arrangements: Spouse/significant other;Other relatives Available Help at Discharge: Family;Available 24 hours/day Type of Home: House Home Access: Ramped entrance     Home Layout: One level Home Equipment: Walker - 2 wheels;Electric scooter      Prior Function Level of Independence: Independent with assistive device(s)         Comments: was ambulatory short distances with RW and then uses electric scooter with long distances     Hand Dominance        Extremity/Trunk Assessment   Upper Extremity Assessment Upper Extremity Assessment: Generalized weakness(B UE grossly 4/5)    Lower Extremity Assessment Lower Extremity Assessment: Generalized weakness(R LE grossly 3/5; L LE grossly 4+/5)       Communication   Communication: No difficulties  Cognition Arousal/Alertness: Awake/alert Behavior During Therapy: WFL for tasks assessed/performed Overall Cognitive Status: Within Functional Limits for tasks assessed  General Comments      Exercises Other Exercises Other Exercises: supine ther-ex performed on R LE including ankle pumps, quad sets, SLRS, SAQ, and glut sets. ALl ther-ex performed x 10 reps with min assist with cues for technique   Assessment/Plan    PT Assessment Patient needs continued PT services  PT Problem List  Decreased strength;Decreased activity tolerance;Decreased balance;Decreased mobility;Pain       PT Treatment Interventions DME instruction;Gait training;Therapeutic exercise;Balance training    PT Goals (Current goals can be found in the Care Plan section)  Acute Rehab PT Goals Patient Stated Goal: to get stronger PT Goal Formulation: With patient Time For Goal Achievement: 12/28/17 Potential to Achieve Goals: Good    Frequency BID   Barriers to discharge        Co-evaluation               AM-PAC PT "6 Clicks" Daily Activity  Outcome Measure Difficulty turning over in bed (including adjusting bedclothes, sheets and blankets)?: Unable Difficulty moving from lying on back to sitting on the side of the bed? : Unable Difficulty sitting down on and standing up from a chair with arms (e.g., wheelchair, bedside commode, etc,.)?: Unable Help needed moving to and from a bed to chair (including a wheelchair)?: A Little Help needed walking in hospital room?: A Lot Help needed climbing 3-5 steps with a railing? : A Lot 6 Click Score: 10    End of Session Equipment Utilized During Treatment: Gait belt Activity Tolerance: Patient tolerated treatment well Patient left: in chair;with chair alarm set;with SCD's reapplied;with family/visitor present Nurse Communication: Mobility status PT Visit Diagnosis: Muscle weakness (generalized) (M62.81);Difficulty in walking, not elsewhere classified (R26.2);Pain Pain - Right/Left: Right Pain - part of body: Hip    Time: 0900-0930 PT Time Calculation (min) (ACUTE ONLY): 30 min   Charges:   PT Evaluation $PT Eval Low Complexity: 1 Low PT Treatments $Therapeutic Exercise: 8-22 mins   PT G CodesGreggory Stallion, PT, DPT [phone]   Durenda Pechacek 12/14/2017, 12:03 PM

## 2017-12-14 NOTE — Progress Notes (Signed)
   12/14/17 1725  Clinical Encounter Type  Visited With Patient and family together  Visit Type Code  Referral From Nurse  Consult/Referral To Chaplain  Spiritual Encounters  Spiritual Needs Emotional   CH received a PG for a RR that was changed to a Code Stroke. [place] reported to the PT's RM and offered ministry of presences. Escorted PT's wife to ICU waiting area.

## 2017-12-14 NOTE — Consult Note (Addendum)
[place] Consultation  Chase Cabrera WCH:[id number] DOB: [birth year] DOA: 12/13/2017 PCP: McLean-Scocuzza, Nino Glow, MD   Requesting physician: Rudene Christians MD Date of consultation: 12/14/2017 Reason for consultation: Unresponsiveness  CHIEF COMPLAINT: Lethargy and decreased responsiveness  HISTORY OF PRESENT ILLNESS: Chase Cabrera  is a 72 y.o. male with a known history of atrial fibrillation, coronary artery disease, cardiac stents, congestive heart failure, Crohn's disease, diabetes mellitus type 2, GERD, gout, hyperlipidemia, hypertension, renal artery stenosis, peptic ulcer disease, spinal stenosis, severe sleep apnea currently under orthopedic service.  Patient had a total hip arthroplasty for osteoarthritis of the right hip.  Today emergency consultation was done by orthopedic service as patient was lethargic with decreased responsiveness.  Rapid response was called and patient was found to have a left-sided weakness code stroke was called patient had emergent CT head which showed no acute abnormality patient.  Moved to the ICU.  Tel neurology consultation was done patient was seen and evaluated he was febrile.. Temperature was around 103 F blood pressure was elevated had weakness in the left upper extremity.  Awake and responsive to verbal commands.  PAST MEDICAL HISTORY:   Past Medical History:  Diagnosis Date  . Arthritis   . Atrial fibrillation ([place])   . CAD (coronary artery disease)    6 STENTS  . CHF (congestive heart failure) ([place])   . Chicken pox   . Colon polyp   . Corneal dystrophy    bilateral  . Crohn's disease ([place])   . Diabetes ([place]) 2002  . Dysrhythmia    A-FIB AND PAF  . GERD (gastroesophageal reflux disease)   . Gout   . History of kidney stones   . History of shingles   . Hyperlipemia   . Hypertension   . Kidney stones   . Myocardial infarction ([place])    x4, last one in 2010  . Peripheral neuropathy   . Peripheral neuropathy   . Peripheral  neuropathy   . PUD (peptic ulcer disease)   . Renal artery stenosis ([place])   . Renal artery stenosis ([place])   . Salzmann's nodular dystrophy 2012  . Sleep apnea   . Spinal stenosis     PAST SURGICAL HISTORY:  Past Surgical History:  Procedure Laterality Date  . ABLATION  2012  . APPLICATION VERTERBRAL DEFECT PROSTHETIC  05/01/2013  . ARTHRODESIS ANTERIOR LUMBAR SPINE  05/01/2013  . BACK SURGERY     lumbar fusion  . CARDIAC CATHETERIZATION    . CARDIAC ELECTROPHYSIOLOGY STUDY AND ABLATION    . CARDIAC SURGERY    . CARDIOVERSION    . CHOLECYSTECTOMY    . COLONOSCOPY WITH PROPOFOL N/A 04/09/2016   Completed for chronic diarrhea.  Few scattered diverticuli.  No mucosal lesions.  Surgeon: Lollie Sails, MD;  Location: [facility] ENDOSCOPY;  Service: Endoscopy;  Laterality: N/A;  . CORNEAL EYE SURGERY Bilateral 07/28/11    09/22/2011  . CORONARY ANGIOPLASTY    . CORONARY ANGIOPLASTY WITH STENT PLACEMENT  03/28/2015   Distal 80% to normal Stent, Dilation Balloon  . CORONARY ARTERY BYPASS GRAFT     4 VESSELS  . EYE SURGERY     bilateral cataract  . foot and ankle repair Right 2005  . FRACTURE SURGERY     ANKLE PLATE AND SCREWS  . GALLBLADER    . JOINT REPLACEMENT     left total hip 11/11/15  . KNEE ARTHROSCOPY Right   . LUMBAR SPINE FUSION ONE LEVEL  05/03/2013  . RENAL ARTERY  STENT Right   . ROTATOR CUFF REPAIR Right 2006  . TONSILLECTOMY    . TOTAL HIP ARTHROPLASTY Left 11/11/2015   Procedure: TOTAL HIP ARTHROPLASTY ANTERIOR APPROACH;  Surgeon: Hessie Knows, MD;  Location: [facility] ORS;  Service: Orthopedics;  Laterality: Left;  . TOTAL HIP ARTHROPLASTY Right 12/13/2017   Procedure: TOTAL HIP ARTHROPLASTY ANTERIOR APPROACH;  Surgeon: Hessie Knows, MD;  Location: [facility] ORS;  Service: Orthopedics;  Laterality: Right;  . TRANSCATH PLACEMENT INTRAVASCULAR STENT LEG  03/2015    SOCIAL HISTORY:  Social History   Tobacco Use  . Smoking status: Former Smoker    Packs/day: 1.00    Years:  3.00    Pack years: 3.00    Types: Cigarettes    Last attempt to quit: 06/30/1962    Years since quitting: 55.4  . Smokeless tobacco: Former Systems developer    Types: Chew    Quit date: 12/05/1968  Substance Use Topics  . Alcohol use: No    FAMILY HISTORY:  Family History  Problem Relation Age of Onset  . CVA Father   . Stroke Father   . Lung cancer Mother   . Arthritis Mother   . Stomach cancer Sister   . Colon cancer Sister   . Diabetes Brother     DRUG ALLERGIES:  Allergies  Allergen Reactions  . Allopurinol Diarrhea, Other (See Comments) and Nausea And Vomiting    Other Reaction: GI Upset  . Atenolol Other (See Comments)    Other Reaction: bradycardia  . Atorvastatin Other (See Comments)    Other Reaction: muscle aches  . Ramipril Rash  . Rosuvastatin Other (See Comments)    Muscle aches  . Simvastatin Other (See Comments)    Other Reaction: MUSCLE ACHES (Zocor)  . Valsartan Other (See Comments)    Other Reaction: facial swelling  . Cardizem [Diltiazem] Rash    REVIEW OF SYSTEMS:   CONSTITUTIONAL: Had fever, has fatigue and weakness.  EYES: No blurred or double vision.  EARS, NOSE, AND THROAT: No tinnitus or ear pain.  RESPIRATORY: No cough, shortness of breath, wheezing or hemoptysis.  CARDIOVASCULAR: No chest pain, orthopnea, edema.  GASTROINTESTINAL: No nausea, vomiting, diarrhea or abdominal pain.  GENITOURINARY: No dysuria, hematuria.  ENDOCRINE: No polyuria, nocturia,  HEMATOLOGY: No anemia, easy bruising or bleeding SKIN: No rash or lesion. MUSCULOSKELETAL: Right hip pain NEUROLOGIC: No tingling, numbness,  weakness left upper extremity PSYCHIATRY: No anxiety or depression.   MEDICATIONS AT HOME:  Prior to Admission medications   Medication Sig Start Date End Date Taking? Authorizing Provider  amLODipine (NORVASC) 10 MG tablet TAKE 1 TABLET BY MOUTH ONCE DAILY Patient taking differently: Take 10 mg by mouth at bedtime 09/16/17  Yes McLean-Scocuzza, Nino Glow, MD  azelastine (ASTELIN) 0.1 % nasal spray Place 2 sprays into both nostrils 2 (two) times daily. Use in each nostril as directed Patient taking differently: Place 2 sprays into both nostrils daily. Use in each nostril as directed 09/09/16  Yes Cook, Jayce G, DO  carvedilol (COREG) 25 MG tablet Take 1 tablet (25 mg total) by mouth 2 (two) times daily with a meal. 10/06/17  Yes McLean-Scocuzza, Nino Glow, MD  Cholecalciferol 50000 units TABS Take 1 tablet by mouth once a week. Patient taking differently: Take 50,000 Units by mouth every Saturday.  10/07/17  Yes McLean-Scocuzza, Nino Glow, MD  clopidogrel (PLAVIX) 75 MG tablet Take 75 mg by mouth daily.   Yes [provider]  colchicine 0.6 MG tablet TAKE 1 TABLET BY  MOUTH ONCE DAILY Patient taking differently: TAKE 0.6 MG BY MOUTH ONCE DAILY 07/12/17  Yes Leone Haven, MD  diclofenac sodium (VOLTAREN) 1 % GEL Apply 2 g topically 4 (four) times daily as needed (for pain).    Yes [provider]  dicyclomine (BENTYL) 10 MG capsule Take 10 mg by mouth 3 (three) times daily before meals.    Yes [provider]  gabapentin (NEURONTIN) 300 MG capsule take 1 capsule by mouth three times a day Patient taking differently: Take 600 mg by mouth 3 times daily 04/19/16  Yes Cook, Jayce G, DO  hydrALAZINE (APRESOLINE) 100 MG tablet TAKE 1 TABLET BY MOUTH THREE TIMES A DAY Patient taking differently: TAKE 100 MG BY MOUTH THREE TIMES A DAY 11/30/17  Yes McLean-Scocuzza, Nino Glow, MD  hydrocortisone 2.5 % cream Apply topically 2 (two) times daily. Patient taking differently: Apply 1 application topically daily as needed (rash behind ear).  10/03/17  Yes McLean-Scocuzza, Nino Glow, MD  insulin lispro (HUMALOG) 100 UNIT/ML KiwkPen Inject 15-30 units SQ up to three times daily per sliding scale 05/03/16  Yes [provider]  isosorbide mononitrate (IMDUR) 120 MG 24 hr tablet TAKE 1 TABLET BY MOUTH EVERY DAY Patient taking differently:  TAKE 120 MG BY MOUTH EVERY DAY 10/07/17  Yes McLean-Scocuzza, Nino Glow, MD  LEVEMIR FLEXTOUCH 100 UNIT/ML Pen Inject 90 Units into the skin daily at 10 pm.  08/24/16  Yes [provider]  mesalamine (LIALDA) 1.2 g EC tablet Take 2.4 g by mouth daily with breakfast. [facility]   Yes [provider]  metFORMIN (GLUCOPHAGE) 1000 MG tablet Take 1,000 mg by mouth 2 (two) times daily.  01/01/15  Yes [provider]  Multiple Vitamin (MULTI-VITAMINS) TABS Take 1 tablet by mouth daily. 04/15/09  Yes [provider]  omeprazole (PRILOSEC) 40 MG capsule Take 40 mg by mouth 2 (two) times daily.    Yes [provider]  oxyCODONE-acetaminophen (PERCOCET/ROXICET) 5-325 MG tablet Take 1-2 tablets by mouth every 6 (six) hours as needed for severe pain.    Yes [provider]  pravastatin (PRAVACHOL) 40 MG tablet TAKE 1 TABLET BY MOUTH ONCE DAILY Patient taking differently: TAKE 40 MG BY MOUTH ONCE DAILY 10/07/17  Yes McLean-Scocuzza, Nino Glow, MD  probenecid (BENEMID) 500 MG tablet Take 500 mg by mouth 2 (two) times daily.  01/09/15  Yes [provider]  vitamin C (ASCORBIC ACID) 500 MG tablet Take 500 mg by mouth daily.    Yes [provider]  Vitamin E 400 units TABS Take 400 Units by mouth 2 (two) times daily.   Yes [provider]  warfarin (COUMADIN) 3 MG tablet 1 tablet  Monday, Wednesday, Friday. Patient taking differently: Take 3 mg by mouth See admin instructions. Take 3 mg by mouth daily on Sundays and Mondays 11/16/16  Yes Lacinda Axon, Jayce G, DO  warfarin (COUMADIN) 4 MG tablet 1 tablet Tuesday, Thursday, Saturday, and Sunday. Patient taking differently: Take 4 mg by mouth See admin instructions. Take 4 mg by mouth daily on Tuesday, Wednesday, Thursday, Friday and Saturday 10/03/17  Yes McLean-Scocuzza, Nino Glow, MD  nitroGLYCERIN (NITROSTAT) 0.4 MG SL tablet Place 1 tablet (0.4 mg total) under the tongue every 5 (five) minutes x 3 doses as  needed. For chest pain.  Call 911 if no relief after 3 tablets 10/03/17 11/02/21  McLean-Scocuzza, Nino Glow, MD      PHYSICAL EXAMINATION:   VITAL SIGNS: Blood pressure (!) 184/67,  pulse 69, temperature (!) 100.6 F (38.1 C), temperature source Oral, resp. rate (!) 26, height 5' 8"  (1.727 m), weight 88.9 kg (196 lb), SpO2 93 %.  GENERAL:  72 y.o.-year-old patient lying in the bed awake and responds to verbal commands EYES: Pupils equal, round, reactive to light and accommodation. No scleral icterus. Extraocular muscles intact.  HEENT: Head atraumatic, normocephalic. Oropharynx dry and nasopharynx clear.  NECK:  Supple, no jugular venous distention. No thyroid enlargement, no tenderness.  LUNGS: Normal breath sounds bilaterally, no wheezing, rales,rhonchi or crepitation. No use of accessory muscles of respiration.  CARDIOVASCULAR: S1, S2 normal. No murmurs, rubs, or gallops.  ABDOMEN: Soft, nontender, nondistended. Bowel sounds present. No organomegaly or mass.  EXTREMITIES: No pedal edema, cyanosis, or clubbing.  Right hip bandage noted NEUROLOGIC: Cranial nerves II through XII are intact. Muscle strength 3/5 in left upper extremity and left lower extremity . Strength 5/5 other extremities. Sensation intact. Gait not checked.  PSYCHIATRIC: The patient is alert and oriented x 2 SKIN: No obvious rash, lesion, or ulcer.   LABORATORY PANEL:   CBC Recent Labs  Lab 12/14/17 1016  WBC 8.8  HGB 11.5*  HCT 33.8*  PLT 222  MCV 101.1*  MCH 34.2*  MCHC 33.9  RDW 13.9   ------------------------------------------------------------------------------------------------------------------  Chemistries  Recent Labs  Lab 12/14/17 1108  NA 133*  K 3.6  CL 100*  CO2 24  GLUCOSE 279*  BUN 14  CREATININE 1.06  CALCIUM 8.3*   ------------------------------------------------------------------------------------------------------------------ estimated creatinine clearance is 67.2 mL/min (by C-G  formula based on SCr of 1.06 mg/dL). ------------------------------------------------------------------------------------------------------------------ No results for input(s): TSH, T4TOTAL, T3FREE, THYROIDAB in the last 72 hours.  Invalid input(s): FREET3   Coagulation profile Recent Labs  Lab 12/13/17 1232 12/14/17 0448  INR 1.12 1.15   ------------------------------------------------------------------------------------------------------------------- No results for input(s): DDIMER in the last 72 hours. -------------------------------------------------------------------------------------------------------------------  Cardiac Enzymes No results for input(s): CKMB, TROPONINI, MYOGLOBIN in the last 168 hours.  Invalid input(s): CK ------------------------------------------------------------------------------------------------------------------ Invalid input(s): POCBNP  ---------------------------------------------------------------------------------------------------------------  Urinalysis    Component Value Date/Time   COLORURINE AMBER (A) 11/16/2017 0905   APPEARANCEUR HAZY (A) 11/16/2017 0905   APPEARANCEUR Clear 09/26/2017 1325   LABSPEC 1.023 11/16/2017 0905   PHURINE 5.0 11/16/2017 0905   GLUCOSEU NEGATIVE 11/16/2017 0905   HGBUR NEGATIVE 11/16/2017 0905   BILIRUBINUR NEGATIVE 11/16/2017 0905   BILIRUBINUR Negative 09/26/2017 1325   KETONESUR NEGATIVE 11/16/2017 0905   PROTEINUR NEGATIVE 11/16/2017 0905   NITRITE NEGATIVE 11/16/2017 0905   LEUKOCYTESUR NEGATIVE 11/16/2017 0905   LEUKOCYTESUR Negative 09/26/2017 1325     RADIOLOGY: Ct Head Wo Contrast  Result Date: 12/14/2017 CLINICAL DATA:  72 y/o M; increased blood pressure and decreased level of consciousness. Inability to swallow. Tremor in arms. EXAM: CT HEAD WITHOUT CONTRAST TECHNIQUE: Contiguous axial images were obtained from the base of the skull through the vertex without intravenous contrast.  COMPARISON:  10/11/2014 MRI of the head. FINDINGS: Brain: No evidence of acute infarction, hemorrhage, hydrocephalus, extra-axial collection or mass lesion/mass effect. Stable mild chronic microvascular ischemic changes and parenchymal volume loss of the brain. Vascular: Calcific atherosclerosis of the carotid siphons. No hyperdense vessel. Skull: Normal. Negative for fracture or focal lesion. Sinuses/Orbits: Mild diffuse paranasal sinus mucosal thickening and partial opacification of left anterior ethmoid air cells. Normal aeration of mastoid air cells. Bilateral intra-ocular lens replacement. Other: None. IMPRESSION: 1. No acute intracranial abnormality identified. 2. Stable mild chronic microvascular ischemic changes and parenchymal volume loss of the brain. 3.  Mild paranasal sinus disease. Electronically Signed   By: Kristine Garbe M.D.   On: 12/14/2017 18:06   Dg Hip Operative Unilat W Or W/o Pelvis Right  Result Date: 12/13/2017 CLINICAL DATA:  Status post right total hip joint replacing using an anterior approach. Reported fluoro time is 12 seconds. EXAM: OPERATIVE right HIP (WITH PELVIS IF PERFORMED) 2 VIEWS TECHNIQUE: Fluoroscopic spot image(s) were submitted for interpretation post-operatively. COMPARISON:  CT scan of the abdomen and pelvis of November 16, 2017 which included coronal and sagittal reconstructions through the pelvis. FINDINGS: The patient has undergone anterior approach right hip joint prosthesis placement. The distal aspect of the femoral component is not included in the field of view. No immediate postprocedure complication is observed. IMPRESSION: Patient has undergone anterior approach right total hip joint prosthesis placement. Electronically Signed   By: David  Martinique M.D.   On: 12/13/2017 16:27   Dg Hip Unilat W Or W/o Pelvis 2-3 Views Right  Result Date: 12/13/2017 CLINICAL DATA:  Status post right hip replacement today. EXAM: DG HIP (WITH OR WITHOUT PELVIS) 2-3V RIGHT  COMPARISON:  None. FINDINGS: Right total hip arthroplasty is in place. The device is located. No fracture. Surgical staples and drain noted. IMPRESSION: Status post right hip replacement.  No acute finding. Electronically Signed   By: Inge Rise M.D.   On: 12/13/2017 17:10    EKG: Orders placed or performed during the hospital encounter of 12/13/17  . EKG 12-Lead  . EKG 12-Lead    IMPRESSION AND PLAN:  72 year old male patient currently under orthopedic service status post a right hip arthroplasty for osteoarthritis, spinal stenosis, coronary artery disease, atrial fibrillation, peripheral neuropathy, diabetes mellitus, hypertension, hyperlipidemia with lethargy and confusion.  1.  Altered mental status could be secondary to sepsis  2.Left-sided weakness rule out CVA Telemetry neuro consultation Start aspirin Neurovascular checks every 4 hourly for 24 hours Check MRI brain, MRA brain, carotid ultrasound and echocardiogram as per neurology recommendations Neurology consultation Currently on Plavix  3.  Sepsis  blood cultures Check lactic acid level Start patient on IV vancomycin and IV Zosyn antibiotics Intensivist consultation  discussed case with ICU attending Dr. Senaida Ores  4.  Uncontrolled hypertension Optimize blood pressure medications  5.  Status post right hip arthroplasty for osteoarthritis Orthopedic follow-up  6.  Appreciate consultation we will follow the patient  7.  DVT prophylaxis Currently on oral Coumadin follow-up INR  8.  Follow-up chest x-ray for any aspiration pneumonia     All the records are reviewed and case discussed with ED provider. Management plans discussed with the patient, family and they are in agreement.  CODE STATUS:FULL CODE    Code Status Orders  (From admission, onward)        Start     Ordered   12/13/17 1814  Full code  Continuous     12/13/17 1814    Code Status History    Date Active Date Inactive Code Status  Order ID Comments User Context   11/17/2015 1047 11/18/2015 1859 Full Code [id number]  Henreitta Leber, MD Inpatient    Advance Directive Documentation     Most Recent Value  Type of Advance Directive  Living will, Healthcare Power of Attorney  Pre-existing out of facility DNR order (yellow form or pink MOST form)  -  "MOST" Form in Place?  -       TOTAL TIME TAKING CARE OF THIS PATIENT: 55 minutes.    Saundra Shelling M.D on 12/14/2017  at 6:37 PM  Between 7am to 6pm - Pager - [phone]  After 6pm go to [email or web address] - password [email or web address] [facility]  [phone]  CC: Primary care physician; McLean-Scocuzza, Nino Glow, MD

## 2017-12-14 NOTE — Consult Note (Signed)
[facility] Tele[facility]urology Consult Services  Asked to see this patient in telemedicine consultation. Consultation was performed with assistance of ancillary / medical staff at bedside.  Impression: Altered mental status Differential Diagnosis: 1. Cardioembolic stroke 2. Small vessel disease/lacune 3. Thromboembolic, artery-to-artery mechanism 4. Hypercoagulable state-related infarct 5. Transient ischemic attack 6. Thrombotic mechanism, large artery disease 7. Early metabolic-toxic encephalopathy  Presentation:  Symptoms: 72 year old man, admitted on 12/13/17 for an elective right hip arthroplasty, POD #1 today.  Physical therapist noted patient was lethargic at 1630 and had a fever of 103 this afternoon.  PMH: atrial fibrillation; was on Coumadin until days prior to hip surgery.  Currently taking Plavix.  INR 1.15 today.   Last Known Well: 1400  History provided by: Nursing staff  History of anticoagulation or other contraindications for thrombolytics: Yes.  Patient had major surgery yesterday.  Telestroke Assessment:  Patient appears as stated age. No obvious acute respiratory or cardiac distress. Patient is well groomed and well-nourished.  NIH Stroke Scale/Score (NIHSS) on 12/14/2017  RESULT SUMMARY: 9 points NIH Stroke Scale   INPUTS: 1A: Level of consciousness -> 1 = Arouses to minor stimulation 1B: Ask month and age -> 2 = 0 questions right  1C: 'Blink eyes' & 'squeeze hands' -> 1 = Performs 1 task 2: Horizontal extraocular movements -> 0 = Normal 3: Visual fields -> 0 = No visual loss 4: Facial palsy -> 0 = Normal symmetry 5A: Left arm motor drift -> 0 = No drift for 10 seconds 5B: Right arm motor drift -> 0 = No drift for 10 seconds 6A: Left leg motor drift -> 2 = Some effort against gravity 6B: Right leg motor drift -> 3 = No effort against gravity 7: Limb Ataxia -> 0 = No ataxia 8: Sensation -> 0 = Normal; no sensory loss 9: Language/aphasia -> 0 = Normal; no  aphasia 10: Dysarthria -> 0 = Normal 11: Extinction/inattention -> 0 = No abnormality  Comments: Door time: NA - patient an Admit. [employer] contacted: [id number] [employer] first log in: [id number] NIHSS assessment time: 3845  Blood glucose and blood pressure within acceptable parameters  CT head reviewed, and there is no indication of acute hemorrhage or mass effect.   Medical Decision Making:  Patient is not candidate for Alteplase due to recent major surgery.  Presentation is not suggestive of large vessel occlusive disease. Thrombectomy would not be recommended.  Recommendations: 1. Daily antithrombotics to be continued 2. Please consult with Neurology to see for routine follow up.  Discuss with hospitalist.  Call placed to Dr. Rudene Christians. Please call with questions.  Dr. Collins [place]. Tommi Rumps [facility]  Medical Decision Making: - Extensive number of diagnosis or management options are considered above. - Extensive amount of complex data reviewed. - High risk of complication and/or morbidity or mortality are associated with differential diagnostic considerations above. - There may be uncertain outcome and increased probability of prolonged functional impairment or high probability of severe prolonged functional impairment associated with some of these differential diagnosis. Medical Data Reviewed: 1. Data reviewed include clinical labs, radiology, medical tests; 2. Tests results discussed w/performing or interpreting physician; 3. Obtaining/reviewing old medical records; 4. Obtaining case history from another source; 5. Independent review of images.

## 2017-12-14 NOTE — Progress Notes (Signed)
Physical Therapy Treatment Patient Details Name: Chase Cabrera MRN: [id number] DOB: 11/01/1945 Today's Date: 12/14/2017    History of Present Illness Pt admitted for R hip THR.     PT Comments    Pt is making slow progress towards goals and is very lethargic this date. Pt seated in recliner sleeping without O2 donned. O2 sats at 87%, donned 2L of O2 with sats at 97%. Attempted there-ex while seated, however takes increased time due to lethargy. Mobility efforts required increased assist as pt struggled with balance and upright posture. Able to ambulate from chair -> bed this session, however greatly fatigued from exertion. Fell asleep once repositioned in bed. Will continue to progress.  Follow Up Recommendations  SNF     Equipment Recommendations  None recommended by PT    Recommendations for Other Services       Precautions / Restrictions Precautions Precautions: Fall;Anterior Hip Precaution Booklet Issued: No Restrictions Weight Bearing Restrictions: Yes RLE Weight Bearing: Weight bearing as tolerated    Mobility  Bed Mobility Overal bed mobility: Needs Assistance Bed Mobility: Sit to Supine       Sit to supine: Mod assist;+2 for physical assistance   General bed mobility comments: pt lethargic this session, needed increased assist for returning back supine and sliding up towards HOB.  Transfers Overall transfer level: Needs assistance Equipment used: Rolling walker (2 wheeled) Transfers: Sit to/from Stand Sit to Stand: Max assist;+2 physical assistance         General transfer comment: 1st attempt with max assist and pt only able to get partial standing. +2 required for further attempts with pt able to stand with slightly improved posture, however needs constant cues for posture.   Ambulation/Gait Ambulation/Gait assistance: Max assist;+2 physical assistance Ambulation Distance (Feet): 3 Feet Assistive device: Rolling walker (2 wheeled) Gait  Pattern/deviations: Step-to pattern     General Gait Details: very slow antalgic gait with pt having increased difficulty taking steps over to bed. Kept trying to sit prior to infront of safe surface. Needed assist and heavy cues. +2 required   Stairs            Wheelchair Mobility    Modified Rankin (Stroke Patients Only)       Balance                                            Cognition Arousal/Alertness: Lethargic Behavior During Therapy: WFL for tasks assessed/performed Overall Cognitive Status: Within Functional Limits for tasks assessed                                        Exercises Other Exercises Other Exercises: Seated ther-ex performed on R LE including LAQ, quad sets, ankle pumps, and glut sets. Too lethargic for further ther-ex once supine in bed. 10 reps with mod assist. Kept requiring cues to stay awake for performance.    General Comments        Pertinent Vitals/Pain Pain Assessment: 0-10 Pain Score: 4  Pain Location: R hip Pain Descriptors / Indicators: Operative site guarding Pain Intervention(s): Limited activity within patient's tolerance;Ice applied;Repositioned    Home Living                      Prior Function  PT Goals (current goals can now be found in the care plan section) Acute Rehab PT Goals Patient Stated Goal: to get stronger PT Goal Formulation: With patient Time For Goal Achievement: 12/28/17 Potential to Achieve Goals: Good Progress towards PT goals: Progressing toward goals    Frequency    BID      PT Plan Current plan remains appropriate    Co-evaluation              AM-PAC PT "6 Clicks" Daily Activity  Outcome Measure  Difficulty turning over in bed (including adjusting bedclothes, sheets and blankets)?: Unable Difficulty moving from lying on back to sitting on the side of the bed? : Unable Difficulty sitting down on and standing up from a chair  with arms (e.g., wheelchair, bedside commode, etc,.)?: Unable Help needed moving to and from a bed to chair (including a wheelchair)?: A Lot Help needed walking in hospital room?: A Lot Help needed climbing 3-5 steps with a railing? : Total 6 Click Score: 8    End of Session Equipment Utilized During Treatment: Gait belt;Oxygen Activity Tolerance: Patient limited by lethargy Patient left: in bed;with bed alarm set;with family/visitor present;with SCD's reapplied Nurse Communication: Mobility status PT Visit Diagnosis: Muscle weakness (generalized) (M62.81);Difficulty in walking, not elsewhere classified (R26.2);Pain Pain - Right/Left: Right Pain - part of body: Hip     Time: 1340-1418 PT Time Calculation (min) (ACUTE ONLY): 38 min  Charges:  $Gait Training: 8-22 mins $Therapeutic Exercise: 23-37 mins                    G Codes:       Greggory Stallion, PT, DPT [phone]    Parissa Chiao 12/14/2017, 4:37 PM

## 2017-12-14 NOTE — Consult Note (Signed)
ANTICOAGULATION CONSULT NOTE - Initial Consult  Pharmacy Consult for Warfarin Dosing  Indication: atrial fibrillation and VTE prophylaxis  Allergies  Allergen Reactions  . Allopurinol Diarrhea, Other (See Comments) and Nausea And Vomiting    Other Reaction: GI Upset  . Atenolol Other (See Comments)    Other Reaction: bradycardia  . Atorvastatin Other (See Comments)    Other Reaction: muscle aches  . Ramipril Rash  . Rosuvastatin Other (See Comments)    Muscle aches  . Simvastatin Other (See Comments)    Other Reaction: MUSCLE ACHES (Zocor)  . Valsartan Other (See Comments)    Other Reaction: facial swelling  . Cardizem [Diltiazem] Rash   Patient Measurements: Height: 5' 8"  (172.7 cm) Weight: 196 lb (88.9 kg) IBW/kg (Calculated) : 68.4  Vital Signs: Temp: 98 F (36.7 C) (04/03 1158) Temp Source: Oral (04/03 1158) BP: 133/59 (04/03 1158) Pulse Rate: 63 (04/03 1158)  Labs: Recent Labs    12/13/17 1232 12/14/17 0448 12/14/17 1016 12/14/17 1108  HGB  --   --  11.5*  --   HCT  --   --  33.8*  --   PLT  --   --  222  --   LABPROT 14.3 14.6  --   --   INR 1.12 1.15  --   --   CREATININE  --   --   --  1.06   Estimated Creatinine Clearance: 67.2 mL/min (by C-G formula based on SCr of 1.06 mg/dL).  Medical History: Past Medical History:  Diagnosis Date  . Arthritis   . Atrial fibrillation ([place])   . CAD (coronary artery disease)    6 STENTS  . CHF (congestive heart failure) ([place])   . Chicken pox   . Colon polyp   . Corneal dystrophy    bilateral  . Crohn's disease ([place])   . Diabetes ([place]) 2002  . Dysrhythmia    A-FIB AND PAF  . GERD (gastroesophageal reflux disease)   . Gout   . History of kidney stones   . History of shingles   . Hyperlipemia   . Hypertension   . Kidney stones   . Myocardial infarction ([place])    x4, last one in 2010  . Peripheral neuropathy   . Peripheral neuropathy   . Peripheral neuropathy   . PUD (peptic ulcer disease)   . Renal  artery stenosis ([place])   . Renal artery stenosis ([place])   . Salzmann's nodular dystrophy 2012  . Sleep apnea   . Spinal stenosis     Assessment: Pharmacy consulted for Warfarin dosing and monitoring for VTE prophylaxis in 72 yo male with PMH of A. Fib. Patient admitted S/P right total hip arthoplasty.   Home Regimen: Warfarin 8m: T,W, Th, F, Sa                             Warfarin 336m Su, M  DATE INR DOSE 4/2 1.12       4 mg 4/3       1.15         Goal of Therapy:  INR 2-3 Monitor platelets by anticoagulation protocol: Yes   Plan:  Will start patient home regimen, with warfarin 20m39monight.   INR daily per protocol Pharmacy will continue to follow and adjust per consult.   LisPaulina FusiharmD, BCPS 12/14/2017 4:09 PM

## 2017-12-14 NOTE — Progress Notes (Signed)
Pt arrived to ICU as a code stroke from floor. Pt went to CT prior to arrival. Pt arrived with elevated BP and o2 sats in low 90's. Pt placed on 4L 02 via [place]. Temp 103F and warm to the touch. Pt A&OX1 with significant left sided weakness. Pt lethargic and and requiring stimulation to stay awake. Tele neuro called and NIH scale performed. Labs drawn and POC BG tested. Pt incontinent of urine, external male catheter placed. Pt with wound vac from surgery to right hip. Pt reporting pain with movement, no medication given at this time. Family in waiting room awaiting update. Md at the bedside, awaiting further orders.

## 2017-12-15 ENCOUNTER — Encounter: Payer: Self-pay | Admitting: Orthopedic Surgery

## 2017-12-15 ENCOUNTER — Inpatient Hospital Stay: Payer: Medicare Other

## 2017-12-15 LAB — GLUCOSE, CAPILLARY
GLUCOSE-CAPILLARY: 195 mg/dL — AB (ref 65–99)
GLUCOSE-CAPILLARY: 212 mg/dL — AB (ref 65–99)
GLUCOSE-CAPILLARY: 232 mg/dL — AB (ref 65–99)
Glucose-Capillary: 237 mg/dL — ABNORMAL HIGH (ref 65–99)

## 2017-12-15 LAB — LACTIC ACID, PLASMA: Lactic Acid, Venous: 1.2 mmol/L (ref 0.5–1.9)

## 2017-12-15 LAB — PROCALCITONIN
PROCALCITONIN: 0.23 ng/mL
Procalcitonin: 0.21 ng/mL

## 2017-12-15 LAB — PROTIME-INR
INR: 1.05
Prothrombin Time: 13.6 seconds (ref 11.4–15.2)

## 2017-12-15 MED ORDER — GADOBENATE DIMEGLUMINE 529 MG/ML IV SOLN
20.0000 mL | Freq: Once | INTRAVENOUS | Status: AC | PRN
  Administered 2017-12-15: 18 mL via INTRAVENOUS

## 2017-12-15 MED ORDER — INSULIN ASPART 100 UNIT/ML ~~LOC~~ SOLN
6.0000 [IU] | Freq: Three times a day (TID) | SUBCUTANEOUS | Status: DC
  Administered 2017-12-15 – 2017-12-17 (×6): 6 [IU] via SUBCUTANEOUS
  Filled 2017-12-15 (×6): qty 1

## 2017-12-15 NOTE — Progress Notes (Signed)
Subjective: 2 Days Post-Op Procedure(s) (LRB): TOTAL HIP ARTHROPLASTY ANTERIOR APPROACH (Right) Patient reports pain as mild.  Improving. Patient is in ICU. Receiving IV abx. Fevers improved. Patient states hes feeling better Denies any CP, SOB, ABD pain. We will continue therapy today.  Plan is to go Skilled nursing facility after hospital stay.  Objective: Vital signs in last 24 hours: Temp:  [98 F (36.7 C)-103 F (39.4 C)] 98.9 F (37.2 C) (04/04 0400) Pulse Rate:  [61-76] 74 (04/04 0700) Resp:  [16-32] 28 (04/04 0700) BP: (133-190)/(56-105) 171/62 (04/04 0700) SpO2:  [90 %-100 %] 100 % (04/04 0700)  Intake/Output from previous day: 04/03 0701 - 04/04 0700 In: 1883.3 [P.O.:480; I.V.:903.3; IV Piggyback:500] Out: 770 [Urine:770] Intake/Output this shift: Total I/O In: 240 [P.O.:240] Out: -   Recent Labs    12/14/17 1016 12/14/17 1832  HGB 11.5* 11.7*   Recent Labs    12/14/17 1016 12/14/17 1832  WBC 8.8 8.6  RBC 3.35* 3.45*  HCT 33.8* 34.2*  PLT 222 204   Recent Labs    12/14/17 1108 12/14/17 1832  NA 133* 134*  K 3.6 4.0  CL 100* 100*  CO2 24 24  BUN 14 13  CREATININE 1.06 0.93  GLUCOSE 279* 224*  CALCIUM 8.3* 8.7*   Recent Labs    12/14/17 0448 12/15/17 0728  INR 1.15 1.05    EXAM General - Patient is Alert, Appropriate and Oriented Extremity - Neurovascular intact Sensation intact distally Intact pulses distally Dorsiflexion/Plantar flexion intact No cellulitis present Compartment soft  No edema lower extremities Dressing - dressing C/D/I and wound vac intact with out drainage Motor Function - intact, moving foot and toes well on exam.   Past Medical History:  Diagnosis Date  . Arthritis   . Atrial fibrillation ([place])   . CAD (coronary artery disease)    6 STENTS  . CHF (congestive heart failure) ([place])   . Chicken pox   . Colon polyp   . Corneal dystrophy    bilateral  . Crohn's disease ([place])   . Diabetes ([place]) 2002  .  Dysrhythmia    A-FIB AND PAF  . GERD (gastroesophageal reflux disease)   . Gout   . History of kidney stones   . History of shingles   . Hyperlipemia   . Hypertension   . Kidney stones   . Myocardial infarction ([place])    x4, last one in 2010  . Peripheral neuropathy   . Peripheral neuropathy   . Peripheral neuropathy   . PUD (peptic ulcer disease)   . Renal artery stenosis ([place])   . Renal artery stenosis ([place])   . Salzmann's nodular dystrophy 2012  . Sleep apnea   . Spinal stenosis     Assessment/Plan:   2 Days Post-Op Procedure(s) (LRB): TOTAL HIP ARTHROPLASTY ANTERIOR APPROACH (Right) Active Problems:   Osteoarthritis of right hip  Estimated body mass index is 29.8 kg/m as calculated from the following:   Height as of this encounter: 5' 8"  (1.727 m).   Weight as of this encounter: 88.9 kg (196 lb). Advance diet Up with therapy  Needs BM Right hip pain improving. Pain controlled. Wound vac intact with out drainage.  Recheck INR in the am Recheck CBC, BMP in the am        DVT Prophylaxis - Plavix, coumadin Weight-Bearing as tolerated to right leg   T. Rachelle Hora, PA-C [place] 12/15/2017, 9:53 AM

## 2017-12-15 NOTE — Progress Notes (Signed)
Spoke with MD Rudene Christians and he recommended to continue with PT and restart scheduled meds.

## 2017-12-15 NOTE — Progress Notes (Signed)
   12/14/17 1855  Clinical Encounter Type  Visited With Patient and family together  Visit Type Follow-up  Referral From Nurse  Consult/Referral To Chaplain  Spiritual Encounters  Spiritual Needs Prayer;Emotional   Latta returned to check on PT's status and check on PT's spouse. ICU was working through shift change and spouse was waiting to see PT. When shift change ended [place] escorted family back to PT's RM and informed the nurse that PT's family had not been updated of PT's status. Nurse facilitated Dr updating family. CH offered silent prayer and will follow up as needed.

## 2017-12-15 NOTE — Consult Note (Signed)
ANTICOAGULATION CONSULT NOTE - Initial Consult  Pharmacy Consult for Warfarin Dosing  Indication: atrial fibrillation and VTE prophylaxis  Allergies  Allergen Reactions  . Allopurinol Diarrhea, Other (See Comments) and Nausea And Vomiting    Other Reaction: GI Upset  . Atenolol Other (See Comments)    Other Reaction: bradycardia  . Atorvastatin Other (See Comments)    Other Reaction: muscle aches  . Ramipril Rash  . Rosuvastatin Other (See Comments)    Muscle aches  . Simvastatin Other (See Comments)    Other Reaction: MUSCLE ACHES (Zocor)  . Valsartan Other (See Comments)    Other Reaction: facial swelling  . Cardizem [Diltiazem] Rash   Patient Measurements: Height: 5' 8"  (172.7 cm) Weight: 196 lb (88.9 kg) IBW/kg (Calculated) : 68.4  Vital Signs: Temp: 98 F (36.7 C) (04/04 1400) Temp Source: Axillary (04/04 1400) BP: 133/56 (04/04 1500) Pulse Rate: 72 (04/04 1500)  Labs: Recent Labs    12/13/17 1232 12/14/17 0448 12/14/17 1016 12/14/17 1108 12/14/17 1832 12/15/17 0728  HGB  --   --  11.5*  --  11.7*  --   HCT  --   --  33.8*  --  34.2*  --   PLT  --   --  222  --  204  --   APTT  --   --   --   --  30  --   LABPROT 14.3 14.6  --   --   --  13.6  INR 1.12 1.15  --   --   --  1.05  CREATININE  --   --   --  1.06 0.93  --    Estimated Creatinine Clearance: 76.6 mL/min (by C-G formula based on SCr of 0.93 mg/dL).  Medical History: Past Medical History:  Diagnosis Date  . Arthritis   . Atrial fibrillation ([place])   . CAD (coronary artery disease)    6 STENTS  . CHF (congestive heart failure) ([place])   . Chicken pox   . Colon polyp   . Corneal dystrophy    bilateral  . Crohn's disease ([place])   . Diabetes ([place]) 2002  . Dysrhythmia    A-FIB AND PAF  . GERD (gastroesophageal reflux disease)   . Gout   . History of kidney stones   . History of shingles   . Hyperlipemia   . Hypertension   . Kidney stones   . Myocardial infarction ([place])    x4, last one  in 2010  . Peripheral neuropathy   . Peripheral neuropathy   . Peripheral neuropathy   . PUD (peptic ulcer disease)   . Renal artery stenosis ([place])   . Renal artery stenosis ([place])   . Salzmann's nodular dystrophy 2012  . Sleep apnea   . Spinal stenosis     Assessment: Pharmacy consulted for Warfarin dosing and monitoring for VTE prophylaxis in 72 yo male with PMH of A. Fib. Patient admitted S/P right total hip arthoplasty.   Home Regimen: Warfarin 41m: T,W, Th, F, Sa                             Warfarin 339m Su, M  DATE INR DOSE 4/2 1.12       4 mg 4/3       1.15       Not given? 4/4 1.05  Goal of Therapy:  INR 2-3 Monitor platelets by anticoagulation protocol: Yes   Plan:  Will continue patient home regimen, with warfarin 85m tonight.  Patient did not receive warfarin last night.  INR daily per protocol Pharmacy will continue to follow and adjust per consult.   SUlice Dash PharmD Clinical Pharmacist  12/15/2017 4:08 PM

## 2017-12-15 NOTE — Progress Notes (Signed)
Physical Therapy Treatment Patient Details Name: Chase Cabrera MRN: [id number] DOB: 1945/12/02 Today's Date: 12/15/2017    History of Present Illness 72 y/o male s/p R hip THR (anterior approprach) 4/3.  Pt had a bout of lethargy and decreased responsiveness - transfered to CCU.      PT Comments    Pt able to participate somewhat better this afternoon.  He is still struggling with any ROM with the R LE, but did have better/more consistent quad control and showed some AROM with SAQ.  Attempt to get to sitting was short lived secondary to c/o severe pain, did discuss with nursing about trying to coordinate some pain meds before session tomorrow to hopefully allow more participation w/o worsening of mental status.  Follow Up Recommendations  SNF     Equipment Recommendations  None recommended by PT    Recommendations for Other Services       Precautions / Restrictions Precautions Precautions: Fall;Anterior Hip Restrictions RLE Weight Bearing: Weight bearing as tolerated    Mobility  Bed Mobility Overal bed mobility: Needs Assistance Bed Mobility: Supine to Sit     Supine to sit: Max assist     General bed mobility comments: Pt was keen to try and sit up, but ultimately needed too much assist and had too much pain with the effort to get to sitting position.   Transfers                    Ambulation/Gait                 Stairs            Wheelchair Mobility    Modified Rankin (Stroke Patients Only)       Balance                                            Cognition Arousal/Alertness: Lethargic Behavior During Therapy: WFL for tasks assessed/performed Overall Cognitive Status: Impaired/Different from baseline Area of Impairment: Attention;Awareness                               General Comments: Pt fell asleep multiple times t/o the session, showed good effort but had medical and pain limitations t/o        Exercises Total Joint Exercises Ankle Circles/Pumps: Strengthening;10 reps Quad Sets: Strengthening;10 reps Gluteal Sets: Strengthening;10 reps Short Arc Quad: AAROM;10 reps Heel Slides: PROM;5 reps;AAROM Hip ABduction/ADduction: AAROM;10 reps Knee Flexion: AAROM;10 reps    General Comments        Pertinent Vitals/Pain Pain Assessment: 0-10 Pain Score: (mild pain at rest, severe c/o pai nwith most movement) Pain Location: Pt highly reactive to any mvt/palpation on the R LE    Home Living                      Prior Function            PT Goals (current goals can now be found in the care plan section) Progress towards PT goals: Progressing toward goals    Frequency    BID      PT Plan Current plan remains appropriate    Co-evaluation              AM-PAC PT "6 Clicks" Daily Activity  Outcome Measure  Difficulty turning over in bed (including adjusting bedclothes, sheets and blankets)?: Unable Difficulty moving from lying on back to sitting on the side of the bed? : Unable Difficulty sitting down on and standing up from a chair with arms (e.g., wheelchair, bedside commode, etc,.)?: Unable Help needed moving to and from a bed to chair (including a wheelchair)?: Total Help needed walking in hospital room?: Total Help needed climbing 3-5 steps with a railing? : Total 6 Click Score: 6    End of Session Equipment Utilized During Treatment: Oxygen Activity Tolerance: Patient limited by lethargy Patient left: in bed;with call bell/phone within reach;with family/visitor present   PT Visit Diagnosis: Muscle weakness (generalized) (M62.81);Difficulty in walking, not elsewhere classified (R26.2);Pain Pain - Right/Left: Right Pain - part of body: Hip     Time: 9753-0051 PT Time Calculation (min) (ACUTE ONLY): 32 min  Charges:  $Therapeutic Exercise: 23-37 mins                    G Codes:       Kreg Shropshire, DPT 12/15/2017, 3:46 PM

## 2017-12-15 NOTE — Progress Notes (Addendum)
Name: Chase Cabrera MRN: [id number] DOB: 08-Aug-1946     CONSULTATION DATE: 12/13/2017  No major issues last night   HISTORY OF PRESENT ILLNESS:    PAST MEDICAL HISTORY :   has a past medical history of Arthritis, Atrial fibrillation ([place]), CAD (coronary artery disease), CHF (congestive heart failure) ([place]), Chicken pox, Colon polyp, Corneal dystrophy, Crohn's disease ([place]), Diabetes ([place]) (2002), Dysrhythmia, GERD (gastroesophageal reflux disease), Gout, History of kidney stones, History of shingles, Hyperlipemia, Hypertension, Kidney stones, Myocardial infarction [facility]), Peripheral neuropathy, Peripheral neuropathy, Peripheral neuropathy, PUD (peptic ulcer disease), Renal artery stenosis ([place]), Renal artery stenosis ([place]), Salzmann's nodular dystrophy (2012), Sleep apnea, and Spinal stenosis.  has a past surgical history that includes GALLBLADER; Cardiac catheterization; Coronary angioplasty; Coronary angioplasty with stent (03/28/2015); Cardiac electrophysiology study and ablation; Cardioversion; Back surgery; Total hip arthroplasty (Left, 11/11/2015); Rotator cuff repair (Right, 2006); Renal artery stent (Right); foot and ankle repair (Right, 2005); Tonsillectomy; Knee arthroscopy (Right); Colonoscopy with propofol (N/A, 04/09/2016); Fracture surgery; APPLICATION VERTERBRAL DEFECT PROSTHETIC (05/01/2013); ARTHRODESIS ANTERIOR LUMBAR SPINE (05/01/2013); Cholecystectomy; Eye surgery; CORNEAL EYE SURGERY (Bilateral, 07/28/11    09/22/2011); LUMBAR SPINE FUSION ONE LEVEL (05/03/2013); TRANSCATH PLACEMENT INTRAVASCULAR STENT LEG (03/2015); Joint replacement; Coronary artery bypass graft; Cardiac surgery; Ablation (2012); and Total hip arthroplasty (Right, 12/13/2017). Prior to Admission medications   Medication Sig Start Date End Date Taking? Authorizing Provider  amLODipine (NORVASC) 10 MG tablet TAKE 1 TABLET BY MOUTH ONCE DAILY Patient taking differently: Take 10 mg by mouth at bedtime 09/16/17  Yes  McLean-Scocuzza, Nino Glow, MD  azelastine (ASTELIN) 0.1 % nasal spray Place 2 sprays into both nostrils 2 (two) times daily. Use in each nostril as directed Patient taking differently: Place 2 sprays into both nostrils daily. Use in each nostril as directed 09/09/16  Yes Cook, Jayce G, DO  carvedilol (COREG) 25 MG tablet Take 1 tablet (25 mg total) by mouth 2 (two) times daily with a meal. 10/06/17  Yes McLean-Scocuzza, Nino Glow, MD  Cholecalciferol 50000 units TABS Take 1 tablet by mouth once a week. Patient taking differently: Take 50,000 Units by mouth every Saturday.  10/07/17  Yes McLean-Scocuzza, Nino Glow, MD  clopidogrel (PLAVIX) 75 MG tablet Take 75 mg by mouth daily.   Yes [provider]  colchicine 0.6 MG tablet TAKE 1 TABLET BY MOUTH ONCE DAILY Patient taking differently: TAKE 0.6 MG BY MOUTH ONCE DAILY 07/12/17  Yes Leone Haven, MD  diclofenac sodium (VOLTAREN) 1 % GEL Apply 2 g topically 4 (four) times daily as needed (for pain).    Yes [provider]  dicyclomine (BENTYL) 10 MG capsule Take 10 mg by mouth 3 (three) times daily before meals.    Yes [provider]  gabapentin (NEURONTIN) 300 MG capsule take 1 capsule by mouth three times a day Patient taking differently: Take 600 mg by mouth 3 times daily 04/19/16  Yes Cook, Jayce G, DO  hydrALAZINE (APRESOLINE) 100 MG tablet TAKE 1 TABLET BY MOUTH THREE TIMES A DAY Patient taking differently: TAKE 100 MG BY MOUTH THREE TIMES A DAY 11/30/17  Yes McLean-Scocuzza, Nino Glow, MD  hydrocortisone 2.5 % cream Apply topically 2 (two) times daily. Patient taking differently: Apply 1 application topically daily as needed (rash behind ear).  10/03/17  Yes McLean-Scocuzza, Nino Glow, MD  insulin lispro (HUMALOG) 100 UNIT/ML KiwkPen Inject 15-30 units SQ up to three times daily per sliding scale 05/03/16  Yes [provider]  isosorbide mononitrate (IMDUR) 120 MG  24 hr tablet TAKE 1 TABLET BY MOUTH EVERY DAY Patient  taking differently: TAKE 120 MG BY MOUTH EVERY DAY 10/07/17  Yes McLean-Scocuzza, Nino Glow, MD  LEVEMIR FLEXTOUCH 100 UNIT/ML Pen Inject 90 Units into the skin daily at 10 pm.  08/24/16  Yes [provider]  mesalamine (LIALDA) 1.2 g EC tablet Take 2.4 g by mouth daily with breakfast. [facility]   Yes [provider]  metFORMIN (GLUCOPHAGE) 1000 MG tablet Take 1,000 mg by mouth 2 (two) times daily.  01/01/15  Yes [provider]  Multiple Vitamin (MULTI-VITAMINS) TABS Take 1 tablet by mouth daily. 04/15/09  Yes [provider]  omeprazole (PRILOSEC) 40 MG capsule Take 40 mg by mouth 2 (two) times daily.    Yes [provider]  oxyCODONE-acetaminophen (PERCOCET/ROXICET) 5-325 MG tablet Take 1-2 tablets by mouth every 6 (six) hours as needed for severe pain.    Yes [provider]  pravastatin (PRAVACHOL) 40 MG tablet TAKE 1 TABLET BY MOUTH ONCE DAILY Patient taking differently: TAKE 40 MG BY MOUTH ONCE DAILY 10/07/17  Yes McLean-Scocuzza, Nino Glow, MD  probenecid (BENEMID) 500 MG tablet Take 500 mg by mouth 2 (two) times daily.  01/09/15  Yes [provider]  vitamin C (ASCORBIC ACID) 500 MG tablet Take 500 mg by mouth daily.    Yes [provider]  Vitamin E 400 units TABS Take 400 Units by mouth 2 (two) times daily.   Yes [provider]  warfarin (COUMADIN) 3 MG tablet 1 tablet  Monday, Wednesday, Friday. Patient taking differently: Take 3 mg by mouth See admin instructions. Take 3 mg by mouth daily on Sundays and Mondays 11/16/16  Yes Lacinda Axon, Jayce G, DO  warfarin (COUMADIN) 4 MG tablet 1 tablet Tuesday, Thursday, Saturday, and Sunday. Patient taking differently: Take 4 mg by mouth See admin instructions. Take 4 mg by mouth daily on Tuesday, Wednesday, Thursday, Friday and Saturday 10/03/17  Yes McLean-Scocuzza, Nino Glow, MD  nitroGLYCERIN (NITROSTAT) 0.4 MG SL tablet Place 1 tablet (0.4 mg total) under the tongue every 5 (five) minutes  x 3 doses as needed. For chest pain.  Call 911 if no relief after 3 tablets 10/03/17 11/02/21  McLean-Scocuzza, Nino Glow, MD   Allergies  Allergen Reactions  . Allopurinol Diarrhea, Other (See Comments) and Nausea And Vomiting    Other Reaction: GI Upset  . Atenolol Other (See Comments)    Other Reaction: bradycardia  . Atorvastatin Other (See Comments)    Other Reaction: muscle aches  . Ramipril Rash  . Rosuvastatin Other (See Comments)    Muscle aches  . Simvastatin Other (See Comments)    Other Reaction: MUSCLE ACHES (Zocor)  . Valsartan Other (See Comments)    Other Reaction: facial swelling  . Cardizem [Diltiazem] Rash    FAMILY HISTORY:  family history includes Arthritis in his mother; CVA in his father; Colon cancer in his sister; Diabetes in his brother; Lung cancer in his mother; Stomach cancer in his sister; Stroke in his father. SOCIAL HISTORY:  reports that he quit smoking about 55 years ago. His smoking use included cigarettes. He has a 3.00 pack-year smoking history. He quit smokeless tobacco use about 49 years ago. His smokeless tobacco use included chew. He reports that he does not drink alcohol or use drugs.  REVIEW OF SYSTEMS:   Unable to obtain due to critical illness   VITAL SIGNS: Temp:  [98 F (36.7 C)-103 F (39.4 C)] 98.9 F (37.2  C) (04/04 0400) Pulse Rate:  [61-76] 74 (04/04 0700) Resp:  [16-32] 28 (04/04 0700) BP: (133-190)/(56-105) 171/62 (04/04 0700) SpO2:  [90 %-100 %] 100 % (04/04 0700)  Physical Examination:  Awake, hard of hearing, oriented, Rt. Neglect and no motor weakness On [place], no distress, BEAE and no rales S1 & S2 audible and no murmur Benign abdomen with normal peristalses S/p Rt. Hip surgery.  No edema  ASSESSMENT / PLAN: Acute hypoxic respiratory failure tolerating [place] -Monitor work of breathing and O2 sat.  TIA. CT head -ve, No CVA as per tele neruo consult. (Rt. Side neglect and improved Lt. Sided weakness) -MRI brain and  management as per neurology.   POD#2 Rt. Hip arthroplasty. Management as per ortho.  Atelectasis  and pneumonia (improved bibasilar airspace disease) -Zosyn + Vanc. Monitor CXR + CBC + FIO2.  Sepsis (improved LA 1.2) -Zosyn + Vanc. Cultures remain -ve.  A fib s/p ablation and cardioversion, CAD s/p PCI and CABG, CHF -Rate controlled. -On Coumadin with subtherapeutic INR.  DM -Glycemic control  GERD, peptic ulcer disease -PPI  HTN with renal artery stenosis -Optimize antihypertensives and monitor hemodynamics  Dyslipidemia -Resume Statin once allowed PO  OSA -Nocturnal CPAP  Anemia -Keep Hb > 7 gm/dl.  Full code  DVT & GI prophylaxis. Continue with supportive care  Critical care time 35 min

## 2017-12-15 NOTE — Progress Notes (Signed)
Physical Therapy Treatment Patient Details Name: Chase Cabrera MRN: [id number] DOB: [birth year] Today's Date: 12/15/2017    History of Present Illness 72 y/o male s/p R hip THR (anterior approprach) 4/3.  Pt had a bout of lethargy and decreased responsiveness - transfered to CCU.      PT Comments    Pt has continue on transfer to CCU orders for PT, still having significant AMS. Pt showed good effort when he was able to stay awake, but was clearly lethargic. He fell asleep multiple time t/o the session and needed heavy cuing to stay on tasks and perform exercises consistently.  He reported minimal pain at rest, but was hypersensitive with most activities with R LE.  Pt generally was willing to participate, but ultimately very functionally limited.     Follow Up Recommendations  SNF     Equipment Recommendations  None recommended by PT    Recommendations for Other Services       Precautions / Restrictions Precautions Precautions: Fall;Anterior Hip Restrictions RLE Weight Bearing: Weight bearing as tolerated    Mobility  Bed Mobility               General bed mobility comments: deferred mobility secondary to lethargy and exquisit pain  Transfers                    Ambulation/Gait                 Stairs            Wheelchair Mobility    Modified Rankin (Stroke Patients Only)       Balance                                            Cognition Arousal/Alertness: Lethargic Behavior During Therapy: WFL for tasks assessed/performed Overall Cognitive Status: Impaired/Different from baseline Area of Impairment: Attention;Awareness                               General Comments: Pt fell asleep multiple times t/o the session, showed good effort but had medical and pain limitations t/o       Exercises Total Joint Exercises Ankle Circles/Pumps: Strengthening;10 reps(pt even pain reactive with dorsum of foot  pressure) Quad Sets: Strengthening;10 reps Gluteal Sets: Strengthening;10 reps Short Arc Quad: AAROM;10 reps Heel Slides: AROM;10 reps Hip ABduction/ADduction: AAROM;10 reps Knee Flexion: AAROM;10 reps    General Comments        Pertinent Vitals/Pain Pain Assessment: 0-10 Pain Score: 6  Pain Location: Pt highly reactive to any mvt/palpation on the R LE    Home Living                      Prior Function            PT Goals (current goals can now be found in the care plan section) Progress towards PT goals: Progressing toward goals    Frequency    BID      PT Plan Current plan remains appropriate    Co-evaluation              AM-PAC PT "6 Clicks" Daily Activity  Outcome Measure  Difficulty turning over in bed (including adjusting bedclothes, sheets and blankets)?: Unable Difficulty moving from lying on back to  sitting on the side of the bed? : Unable Difficulty sitting down on and standing up from a chair with arms (e.g., wheelchair, bedside commode, etc,.)?: Unable Help needed moving to and from a bed to chair (including a wheelchair)?: Total Help needed walking in hospital room?: Total Help needed climbing 3-5 steps with a railing? : Total 6 Click Score: 6    End of Session Equipment Utilized During Treatment: Gait belt;Oxygen Activity Tolerance: Patient limited by lethargy Patient left: in bed;with call bell/phone within reach;with family/visitor present   PT Visit Diagnosis: Muscle weakness (generalized) (M62.81);Difficulty in walking, not elsewhere classified (R26.2);Pain Pain - Right/Left: Right Pain - part of body: Hip     Time: 9584-4171 PT Time Calculation (min) (ACUTE ONLY): 26 min  Charges:  $Therapeutic Exercise: 23-37 mins                    G Codes:       Kreg Shropshire, DPT 12/15/2017, 1:07 PM

## 2017-12-15 NOTE — Anesthesia Postprocedure Evaluation (Signed)
Anesthesia Post Note  Patient: Chase Cabrera  Procedure(s) Performed: TOTAL HIP ARTHROPLASTY ANTERIOR APPROACH (Right )  Patient location during evaluation: ICU Anesthesia Type: Spinal Level of consciousness: awake Pain management: pain level controlled Vital Signs Assessment: post-procedure vital signs reviewed and stable Respiratory status: spontaneous breathing, respiratory function stable and patient connected to nasal cannula oxygen Cardiovascular status: blood pressure returned to baseline and stable Postop Assessment: no headache, no backache and no apparent nausea or vomiting Anesthetic complications: no     Last Vitals:  Vitals:   12/15/17 0600 12/15/17 0700  BP: (!) 182/67 (!) 171/62  Pulse: 76 74  Resp: (!) 27 (!) 28  Temp:    SpO2: 99% 100%    Last Pain:  Vitals:   12/15/17 0400  TempSrc: Axillary  PainSc: Asleep                 Brantley Fling

## 2017-12-15 NOTE — Progress Notes (Signed)
Inpatient Diabetes Program Recommendations  AACE/ADA: New Consensus Statement on Inpatient Glycemic Control (2015)  Target Ranges:  Prepandial:   less than 140 mg/dL      Peak postprandial:   less than 180 mg/dL (1-2 hours)      Critically ill patients:  140 - 180 mg/dL   Lab Results  Component Value Date   GLUCAP 212 (H) 12/15/2017   HGBA1C 6.5 (H) 10/03/2017    Review of Glycemic ControlResults for MAYNARD, DAVID (MRN [id number]) as of 12/15/2017 10:28  Ref. Range 12/14/2017 07:57 12/14/2017 11:57 12/14/2017 16:14 12/14/2017 18:01 12/15/2017 08:22  Glucose-Capillary Latest Ref Range: 65 - 99 mg/dL 241 (H) 260 (H) 213 (H) 195 (H) 212 (H)   Diabetes history: Type 2 DM Outpatient Diabetes medications:  Humalog 15-30 units tid with meals, Levemir 90 units q HS, Metformin 1000 mg bid Current orders for Inpatient glycemic control:  Novolog moderate tid with meals, Novolog 4 units tid with meals, Levemir 90 units q HS  Inpatient Diabetes Program Recommendations:    May consider increasing Levemir to 95 units q HS.  Also may consider increasing Novolog meal coverage to 8 units tid with meals.  While in ICU, may consider increasing Novolog correction to q 4 hours as well.   Thanks,  Adah Perl, RN, BC-ADM Inpatient Diabetes Coordinator Pager [phone] (8a-5p)

## 2017-12-16 ENCOUNTER — Inpatient Hospital Stay: Payer: Medicare Other

## 2017-12-16 ENCOUNTER — Inpatient Hospital Stay
Admission: RE | Admit: 2017-12-16 | Discharge: 2017-12-16 | Disposition: A | Discharge status: Home / Self Care | Payer: Medicare Other | Source: Ambulatory Visit | Attending: Internal Medicine | Admitting: Internal Medicine

## 2017-12-16 DIAGNOSIS — G459 Transient cerebral ischemic attack, unspecified: Secondary | ICD-10-CM

## 2017-12-16 LAB — ECHOCARDIOGRAM COMPLETE
HEIGHTINCHES: 68 in
WEIGHTICAEL: 3326.3 [oz_av]

## 2017-12-16 LAB — URINE CULTURE: CULTURE: NO GROWTH

## 2017-12-16 LAB — CBC WITH DIFFERENTIAL/PLATELET
BASOS PCT: 0 %
Basophils Absolute: 0 10*3/uL (ref 0–0.1)
Eosinophils Absolute: 0.4 10*3/uL (ref 0–0.7)
Eosinophils Relative: 4 %
HEMATOCRIT: 27.5 % — AB (ref 40.0–52.0)
HEMOGLOBIN: 9.6 g/dL — AB (ref 13.0–18.0)
LYMPHS ABS: 1.1 10*3/uL (ref 1.0–3.6)
LYMPHS PCT: 10 %
MCH: 34.2 pg — AB (ref 26.0–34.0)
MCHC: 34.7 g/dL (ref 32.0–36.0)
MCV: 98.5 fL (ref 80.0–100.0)
MONO ABS: 1.3 10*3/uL — AB (ref 0.2–1.0)
MONOS PCT: 13 %
NEUTROS ABS: 7.6 10*3/uL — AB (ref 1.4–6.5)
Neutrophils Relative %: 73 %
Platelets: 190 10*3/uL (ref 150–440)
RBC: 2.8 MIL/uL — ABNORMAL LOW (ref 4.40–5.90)
RDW: 13.4 % (ref 11.5–14.5)
WBC: 10.4 10*3/uL (ref 3.8–10.6)

## 2017-12-16 LAB — GLUCOSE, CAPILLARY
GLUCOSE-CAPILLARY: 225 mg/dL — AB (ref 65–99)
GLUCOSE-CAPILLARY: 182 mg/dL — AB (ref 65–99)
Glucose-Capillary: 247 mg/dL — ABNORMAL HIGH (ref 65–99)
Glucose-Capillary: 210 mg/dL — ABNORMAL HIGH (ref 65–99)
Glucose-Capillary: 194 mg/dL — ABNORMAL HIGH (ref 65–99)

## 2017-12-16 LAB — BASIC METABOLIC PANEL
Anion gap: 8 (ref 5–15)
BUN: 15 mg/dL (ref 6–20)
CALCIUM: 8 mg/dL — AB (ref 8.9–10.3)
CHLORIDE: 96 mmol/L — AB (ref 101–111)
CO2: 28 mmol/L (ref 22–32)
CREATININE: 0.96 mg/dL (ref 0.61–1.24)
GFR calc Af Amer: >60 mL/min (ref >60)
GFR calc non Af Amer: >60 mL/min (ref >60)
GLUCOSE: 208 mg/dL — AB (ref 65–99)
Potassium: 3.5 mmol/L (ref 3.5–5.1)
Sodium: 132 mmol/L — ABNORMAL LOW (ref 135–145)

## 2017-12-16 LAB — PROTIME-INR
INR: 1.22
Prothrombin Time: 15.3 seconds — ABNORMAL HIGH (ref 11.4–15.2)

## 2017-12-16 LAB — SURGICAL PATHOLOGY

## 2017-12-16 LAB — PROCALCITONIN: Procalcitonin: 0.36 ng/mL

## 2017-12-16 MED ORDER — ENOXAPARIN SODIUM 40 MG/0.4ML ~~LOC~~ SOLN
40.0000 mg | SUBCUTANEOUS | Status: DC
  Administered 2017-12-16: 40 mg via SUBCUTANEOUS
  Filled 2017-12-16: qty 0.4

## 2017-12-16 MED ORDER — AMOXICILLIN-POT CLAVULANATE 875-125 MG PO TABS
1.0000 | ORAL_TABLET | Freq: Two times a day (BID) | ORAL | Status: DC
  Administered 2017-12-16 – 2017-12-17 (×3): 1 via ORAL
  Filled 2017-12-16 (×3): qty 1

## 2017-12-16 MED ORDER — WARFARIN SODIUM 4 MG PO TABS
4.0000 mg | ORAL_TABLET | Freq: Every day | ORAL | Status: DC
  Administered 2017-12-16: 4 mg via ORAL
  Filled 2017-12-16 (×2): qty 1

## 2017-12-16 MED ORDER — HYDROCODONE-ACETAMINOPHEN 7.5-325 MG PO TABS: 1.0000 | ORAL_TABLET | ORAL | 0 refills | Status: DC | PRN

## 2017-12-16 MED ORDER — DOCUSATE SODIUM 100 MG PO CAPS: 100.0000 mg | ORAL_CAPSULE | Freq: Two times a day (BID) | ORAL | 0 refills | Status: DC

## 2017-12-16 NOTE — Progress Notes (Signed)
*  PRELIMINARY RESULTS* Echocardiogram 2D Echocardiogram has been performed.  Chase Cabrera Chase Cabrera 12/16/2017, 9:47 AM

## 2017-12-16 NOTE — Progress Notes (Signed)
Inpatient Diabetes Program Recommendations  AACE/ADA: New Consensus Statement on Inpatient Glycemic Control (2015)  Target Ranges:  Prepandial:   less than 140 mg/dL      Peak postprandial:   less than 180 mg/dL (1-2 hours)      Critically ill patients:  140 - 180 mg/dL   Results for Chase Cabrera, Chase Cabrera (MRN [id number]) as of 12/16/2017 08:46  Ref. Range 12/15/2017 08:22 12/15/2017 11:41 12/15/2017 17:43  Glucose-Capillary Latest Ref Range: 65 - 99 mg/dL 212 (H)  9 units NOVOLOG  232 (H)  9 units NOVOLOG  237 (H)  11 units NOVOLOG +  90 units Levemir given at 11pm   Results for Chase Cabrera, Chase Cabrera (MRN [id number]) as of 12/16/2017 08:46  Ref. Range 12/16/2017 07:45  Glucose-Capillary Latest Ref Range: 65 - 99 mg/dL 210 (H)  11 units NOVOLOG      Home DM Meds: Levemir 90 units QHS       Humalog 15-30 units TID with meals       Metformin 1000 mg BID  Current Orders: Levemir 90 units QHS       Novolog Moderate Correction Scale/ SSI (0-15 units) TID AC      Novolog 6 units TID with meals       Metformin 1000 mg BID      MD- Please consider the following in-hospital insulin adjustments:  1. Increase Levemir to 95 units QHS  2. Increase Novolog Meal Coverage to: Novolog 10 units TID with meals (hold if pt eats <50% of meal)      --Will follow patient during hospitalization--  Wyn Quaker RN, MSN, CDE Diabetes Coordinator Inpatient Glycemic Control Team Team Pager: [phone] (8a-5p)

## 2017-12-16 NOTE — Progress Notes (Signed)
[facility] NAME: Chase Cabrera    MR#:  [id number]  DATE OF BIRTH:  Sep 20, 1945  SUBJECTIVE:  CHIEF COMPLAINT:  No chief complaint on file. No complains today, more alert. R hip pain.  REVIEW OF SYSTEMS:  CONSTITUTIONAL: No fever, fatigue or weakness.  EYES: No blurred or double vision.  EARS, NOSE, AND THROAT: No tinnitus or ear pain.  RESPIRATORY: No cough, shortness of breath, wheezing or hemoptysis.  CARDIOVASCULAR: No chest pain, orthopnea, edema.  GASTROINTESTINAL: No nausea, vomiting, diarrhea or abdominal pain.  GENITOURINARY: No dysuria, hematuria.  ENDOCRINE: No polyuria, nocturia,  HEMATOLOGY: No anemia, easy bruising or bleeding SKIN: No rash or lesion. MUSCULOSKELETAL: No joint pain or arthritis.   NEUROLOGIC: No tingling, numbness, weakness.  PSYCHIATRY: No anxiety or depression.   ROS  DRUG ALLERGIES:   Allergies  Allergen Reactions  . Allopurinol Diarrhea, Other (See Comments) and Nausea And Vomiting    Other Reaction: GI Upset  . Atenolol Other (See Comments)    Other Reaction: bradycardia  . Atorvastatin Other (See Comments)    Other Reaction: muscle aches  . Ramipril Rash  . Rosuvastatin Other (See Comments)    Muscle aches  . Simvastatin Other (See Comments)    Other Reaction: MUSCLE ACHES (Zocor)  . Valsartan Other (See Comments)    Other Reaction: facial swelling  . Cardizem [Diltiazem] Rash    VITALS:  Blood pressure (!) 145/48, pulse 72, temperature (!) 100.4 F (38 C), temperature source Oral, resp. rate 19, height 5' 8"  (1.727 m), weight 94.3 kg (207 lb 14.3 oz), SpO2 97 %.  PHYSICAL EXAMINATION:  GENERAL:  72 y.o.-year-old patient lying in the bed with no acute distress.  EYES: Pupils equal, round, reactive to light and accommodation. No scleral icterus. Extraocular muscles intact.  HEENT: Head atraumatic, normocephalic. Oropharynx and nasopharynx clear.  NECK:  Supple, no jugular  venous distention. No thyroid enlargement, no tenderness.  LUNGS: Normal breath sounds bilaterally, no wheezing, rales,rhonchi or crepitation. No use of accessory muscles of respiration.  CARDIOVASCULAR: S1, S2 normal. No murmurs, rubs, or gallops.  ABDOMEN: Soft, nontender, nondistended. Bowel sounds present. No organomegaly or mass.  EXTREMITIES: No pedal edema, cyanosis, or clubbing.  NEUROLOGIC: Cranial nerves II through XII are intact. Muscle strength 4/5 in all extremities. Right hip pain. Sensation intact. Gait not checked.  PSYCHIATRIC: The patient is alert and oriented x 3.  SKIN: No obvious rash, lesion, or ulcer.   Physical Exam LABORATORY PANEL:   CBC Recent Labs  Lab 12/16/17 0440  WBC 10.4  HGB 9.6*  HCT 27.5*  PLT 190   ------------------------------------------------------------------------------------------------------------------  Chemistries  Recent Labs  Lab 12/16/17 0440  NA 132*  K 3.5  CL 96*  CO2 28  GLUCOSE 208*  BUN 15  CREATININE 0.96  CALCIUM 8.0*   ------------------------------------------------------------------------------------------------------------------  Cardiac Enzymes No results for input(s): TROPONINI in the last 168 hours. ------------------------------------------------------------------------------------------------------------------  RADIOLOGY:  Dg Chest 1 View  Result Date: 12/14/2017 CLINICAL DATA:  Code stroke.  Febrile. EXAM: CHEST  1 VIEW COMPARISON:  Chest radiograph October 12, 2017, PET CT December 08, 2017 FINDINGS: The cardiac silhouette is moderately enlarged, increased from prior examination. Pulmonary vascular congestion interstitial prominence increased from prior examination. Multiple pulmonary nodules/granulomas. Status post median sternotomy for CABG, coronary artery stent. No pneumothorax. Osteopenia. Soft tissue planes are unchanged. IMPRESSION: Worsening cardiomegaly with pulmonary vascular  congestion/interstitial prominence compatible with pulmonary edema. Multiple pulmonary nodules, please see PET  CT November 30, 2017 for dedicated findings. Electronically Signed   By: Elon Alas M.D.   On: 12/14/2017 18:41   Ct Head Wo Contrast  Result Date: 12/14/2017 CLINICAL DATA:  72 y/o M; increased blood pressure and decreased level of consciousness. Inability to swallow. Tremor in arms. EXAM: CT HEAD WITHOUT CONTRAST TECHNIQUE: Contiguous axial images were obtained from the base of the skull through the vertex without intravenous contrast. COMPARISON:  10/11/2014 MRI of the head. FINDINGS: Brain: No evidence of acute infarction, hemorrhage, hydrocephalus, extra-axial collection or mass lesion/mass effect. Stable mild chronic microvascular ischemic changes and parenchymal volume loss of the brain. Vascular: Calcific atherosclerosis of the carotid siphons. No hyperdense vessel. Skull: Normal. Negative for fracture or focal lesion. Sinuses/Orbits: Mild diffuse paranasal sinus mucosal thickening and partial opacification of left anterior ethmoid air cells. Normal aeration of mastoid air cells. Bilateral intra-ocular lens replacement. Other: None. IMPRESSION: 1. No acute intracranial abnormality identified. 2. Stable mild chronic microvascular ischemic changes and parenchymal volume loss of the brain. 3. Mild paranasal sinus disease. Electronically Signed   By: Kristine Garbe M.D.   On: 12/14/2017 18:06   Mr Jeri Cos JA Contrast  Result Date: 12/15/2017 CLINICAL DATA:  Follow-up stroke. History of calcified mesenteric lesion, atrial fibrillation, hypertension, hyperlipidemia. EXAM: MRI HEAD WITHOUT AND WITH CONTRAST TECHNIQUE: Multiplanar, multiecho pulse sequences of the brain and surrounding structures were obtained without and with intravenous contrast. CONTRAST:  47m MULTIHANCE GADOBENATE DIMEGLUMINE 529 MG/ML IV SOLN COMPARISON:  CT HEAD December 14, 2017 and MRI of the head October 11, 2014  FINDINGS: INTRACRANIAL CONTENTS: No reduced diffusion to suggest acute ischemia or hypercellular tumor. No susceptibility artifact to suggest hemorrhage. Moderate parenchymal brain volume loss. No hydrocephalus. Patchy to confluent supratentorial white matter FLAIR T2 hyperintensities increased from prior MRI. No suspicious parenchymal signal, masses, mass effect. RIGHT temporal lobe developmental venous anomaly suggested on coronal post gadolinium image 19, however not present on post gadolinium axial T1 or prior MRI, most compatible with artifact. No abnormal intraparenchymal or extra-axial enhancement. No abnormal extra-axial fluid collections. No extra-axial masses. VASCULAR: Normal major intracranial vascular flow voids present at skull base. SKULL AND UPPER CERVICAL SPINE: No abnormal sellar expansion. No suspicious calvarial bone marrow signal. Craniocervical junction maintained. SINUSES/ORBITS: Mild paranasal sinus mucosal thickening without air-fluid levels. Small bilateral mastoid effusions.The included ocular globes and orbital contents are non-suspicious. Status post bilateral ocular lens implants. OTHER: None. IMPRESSION: 1. No acute intracranial process. 2. Moderate parenchymal brain volume loss, progressed from 2016. 3. Mild-to-moderate chronic small vessel ischemic disease, progressed from 2016. Electronically Signed   By: CElon AlasM.D.   On: 12/15/2017 17:10   UKoreaCarotid Bilateral  Result Date: 12/15/2017 CLINICAL DATA:  Stroke. History of CAD (post CABG), hypertension and diabetes. EXAM: BILATERAL CAROTID DUPLEX ULTRASOUND TECHNIQUE: GPearline Cablesscale imaging, color Doppler and duplex ultrasound were performed of bilateral carotid and vertebral arteries in the neck. COMPARISON:  None. FINDINGS: Criteria: Quantification of carotid stenosis is based on velocity parameters that correlate the residual internal carotid diameter with NASCET-based stenosis levels, using the diameter of the distal  internal carotid lumen as the denominator for stenosis measurement. The following velocity measurements were obtained: RIGHT ICA:  100/21 cm/sec CCA:  1250/53cm/sec SYSTOLIC ICA/CCA RATIO:  0.7 ECA:  180 cm/sec LEFT ICA:  126/36 cm/sec CCA:  1976/73cm/sec SYSTOLIC ICA/CCA RATIO:  0.9 ECA:  132 cm/sec RIGHT CAROTID ARTERY: There is a minimal amount of mixed echogenic plaque involving the proximal  aspects of the right internal carotid artery (image 22), not resulting in elevated peak systolic velocities within the interrogated course the right internal carotid artery to suggest a hemodynamically significant stenosis. Borderline elevated peak systolic velocity within distal aspect the right internal carotid artery is felt to be factitiously elevated due to sampling at a location of turbulent flow. RIGHT VERTEBRAL ARTERY:  Antegrade Flow LEFT CAROTID ARTERY: There is a minimal amount of atherosclerotic plaque with the left carotid bulb (image 47). There is a minimal to moderate amount of mixed echogenic partially shadowing plaque involving the origin and proximal aspects of the left internal carotid artery (image 54), which results in borderline elevated peak systolic velocities within the proximal and mid aspects of the left internal carotid artery. Borderline elevated peak systolic velocity with the proximal ICA measures 120 cm/sec - image 56. LEFT VERTEBRAL ARTERY:  Antegrade flow IMPRESSION: 1. Minimal to moderate amount of left-sided atherosclerotic plaque results in borderline elevated peak systolic velocities within the left internal carotid artery which approach the 50% luminal narrowing range. Further evaluation with CTA could performed as clinically indicated. 2. Minimal amount of right-sided atherosclerotic plaque, not resulting in a hemodynamically significant stenosis. Electronically Signed   By: Sandi Mariscal M.D.   On: 12/15/2017 17:35   Dg Chest Port 1 View  Result Date: 12/16/2017 CLINICAL DATA:   Difficulty breathing EXAM: PORTABLE CHEST 1 VIEW COMPARISON:  December 15, 2017 FINDINGS: There is patchy atelectasis in the lung bases. There is no frank edema or consolidation. There is cardiomegaly with pulmonary vascularity within normal limits. No adenopathy. Patient is status post median sternotomy. There is postoperative change in the right shoulder. No adenopathy. There is aortic atherosclerosis. IMPRESSION: Patchy bibasilar atelectasis. No edema or consolidation. Stable cardiomegaly. Aortic atherosclerosis. Aortic Atherosclerosis (ICD10-I70.0). Electronically Signed   By: Lowella Grip III M.D.   On: 12/16/2017 07:34   Dg Chest Port 1 View  Result Date: 12/15/2017 CLINICAL DATA:  Acute respiratory failure EXAM: PORTABLE CHEST 1 VIEW COMPARISON:  12/14/2017 FINDINGS: Postoperative changes in the mediastinum. Shallow inspiration. Cardiac enlargement with pulmonary vascular congestion appears improving since previous study. Linear atelectasis in the left lung base. No definite edema or effusion. No pneumothorax. Calcification of the aorta. Postoperative changes in the right shoulder. IMPRESSION: Cardiac enlargement with improving pulmonary vascularity and congestive changes since previous study. Atelectasis in the left lung base. Aortic atherosclerosis. Electronically Signed   By: Lucienne Capers M.D.   On: 12/15/2017 05:12    ASSESSMENT AND PLAN:   Active Problems:   Osteoarthritis of right hip  72 year old male patient currently under orthopedic service status post a right hip arthroplasty for osteoarthritis, spinal stenosis, coronary artery disease, atrial fibrillation, peripheral neuropathy, diabetes mellitus, hypertension, hyperlipidemia with lethargy and confusion.  1.  Altered mental status could be secondary to sepsis  2.Left-sided weakness rule out CVA Tele neuro consultation. Start aspirin Check MRI brain, carotid ultrasound and echocardiogram as per neurology  recommendations Neurology consultation Currently on Plavix   Weakness improved, pt is oriented X3.  3.  Sepsis - pneumonia blood cultures- negative so far.  patient on IV vancomycin and IV Zosyn antibiotics Intensivist consultation  discussed case with ICU attending Dr. Senaida Ores  4.  Uncontrolled hypertension Optimize blood pressure medications  5.  Status post right hip arthroplasty for osteoarthritis Orthopedic follow-up  6.  DVT prophylaxis Currently on oral Coumadin follow-up INR- pharmacy to dose.  7.A fib s/p ablation and cardioversion, CAD s/p PCI and CABG, CHF -Rate  controlled. -On Coumadin with subtherapeutic INR.      All the records are reviewed and case discussed with Care Management/Social Workerr. Management plans discussed with the patient, family and they are in agreement.  CODE STATUS: Full.  TOTAL TIME TAKING CARE OF THIS PATIENT: 45 minutes.     POSSIBLE D/C IN 1-2 DAYS, DEPENDING ON CLINICAL CONDITION.   Vaughan Basta M.D on 12/16/2017   Between 7am to 6pm - Pager - [phone]  After 6pm go to [email or web address] - password [email or web address] [facility]  Office  [phone]  CC: Primary care physician; Cassandria Santee, MD  Note: This dictation was prepared with Dragon dictation along with smaller phrase technology. Any transcriptional errors that result from this process are unintentional.

## 2017-12-16 NOTE — Progress Notes (Signed)
Subjective: 3 Days Post-Op Procedure(s) (LRB): TOTAL HIP ARTHROPLASTY ANTERIOR APPROACH (Right) Patient reports pain as mild.  Improving.  Denies any other complaints.  Wife states patient had an episode of dry cough last night but this improved. Patient continuing to receive IV antibiotics.  Low-grade fever 100.4. Denies any CP, SOB, ABD pain. We will continue therapy today.  Plan is to go Skilled nursing facility after hospital stay.  Objective: Vital signs in last 24 hours: Temp:  [98 F (36.7 C)-100.4 F (38 C)] 100.4 F (38 C) (04/05 0525) Pulse Rate:  [64-75] 72 (04/04 2130) Resp:  [19-31] 19 (04/04 2130) BP: (112-153)/(48-98) 145/48 (04/04 2130) SpO2:  [91 %-100 %] 97 % (04/04 2130) Weight:  [94.3 kg (207 lb 14.3 oz)] 94.3 kg (207 lb 14.3 oz) (04/04 2130)  Intake/Output from previous day: 04/04 0701 - 04/05 0700 In: 2033.3 [P.O.:480; I.V.:1053.3; IV Piggyback:500] Out: -  Intake/Output this shift: No intake/output data recorded.  Recent Labs    12/14/17 1016 12/14/17 1832 12/16/17 0440  HGB 11.5* 11.7* 9.6*   Recent Labs    12/14/17 1832 12/16/17 0440  WBC 8.6 10.4  RBC 3.45* 2.80*  HCT 34.2* 27.5*  PLT 204 190   Recent Labs    12/14/17 1832 12/16/17 0440  NA 134* 132*  K 4.0 3.5  CL 100* 96*  CO2 24 28  BUN 13 15  CREATININE 0.93 0.96  GLUCOSE 224* 208*  CALCIUM 8.7* 8.0*   Recent Labs    12/15/17 0728 12/16/17 0440  INR 1.05 1.22    EXAM General - Patient is Alert, Appropriate and Oriented Extremity - Neurovascular intact Sensation intact distally Intact pulses distally Dorsiflexion/Plantar flexion intact No cellulitis present Compartment soft  No edema lower extremities Dressing - dressing C/D/I and wound vac intact with out drainage Motor Function - intact, moving foot and toes well on exam.   Past Medical History:  Diagnosis Date  . Arthritis   . Atrial fibrillation ([place])   . CAD (coronary artery disease)    6 STENTS  . CHF  (congestive heart failure) ([place])   . Chicken pox   . Colon polyp   . Corneal dystrophy    bilateral  . Crohn's disease ([place])   . Diabetes ([place]) 2002  . Dysrhythmia    A-FIB AND PAF  . GERD (gastroesophageal reflux disease)   . Gout   . History of kidney stones   . History of shingles   . Hyperlipemia   . Hypertension   . Kidney stones   . Myocardial infarction (DISH)    x4, last one in 2010  . Peripheral neuropathy   . Peripheral neuropathy   . Peripheral neuropathy   . PUD (peptic ulcer disease)   . Renal artery stenosis ([place])   . Renal artery stenosis ([place])   . Salzmann's nodular dystrophy 2012  . Sleep apnea   . Spinal stenosis     Assessment/Plan:   3 Days Post-Op Procedure(s) (LRB): TOTAL HIP ARTHROPLASTY ANTERIOR APPROACH (Right) Active Problems:   Osteoarthritis of right hip  Estimated body mass index is 31.61 kg/m as calculated from the following:   Height as of this encounter: 5' 8"  (1.727 m).   Weight as of this encounter: 94.3 kg (207 lb 14.3 oz). Advance diet Up with therapy  Needs BM Right hip pain improving. Pain controlled. Wound vac intact with out drainage.  INR subtherapeutic at 1.22. Hemoglobin and hematocrit stable Patient with continued low-grade fever.  Currently receiving IV  antibiotics.  We will continue to monitor. Care management to assist with discharge to rehab facility pending medical clearance         DVT Prophylaxis - Plavix, coumadin Weight-Bearing as tolerated to right leg   T. Rachelle Hora, PA-C [place] 12/16/2017, 7:42 AM

## 2017-12-16 NOTE — Progress Notes (Signed)
[facility] [place] NAME: Chase Cabrera    MR#:  [id number]  DATE OF BIRTH:  06/05/1946  SUBJECTIVE:  CHIEF COMPLAINT:  No chief complaint on file. No complains today, more alert. R hip pain.  Off oxygen, sitting in chair.  REVIEW OF SYSTEMS:  CONSTITUTIONAL: No fever, fatigue or weakness.  EYES: No blurred or double vision.  EARS, NOSE, AND THROAT: No tinnitus or ear pain.  RESPIRATORY: No cough, shortness of breath, wheezing or hemoptysis.  CARDIOVASCULAR: No chest pain, orthopnea, edema.  GASTROINTESTINAL: No nausea, vomiting, diarrhea or abdominal pain.  GENITOURINARY: No dysuria, hematuria.  ENDOCRINE: No polyuria, nocturia,  HEMATOLOGY: No anemia, easy bruising or bleeding SKIN: No rash or lesion. MUSCULOSKELETAL: No joint pain or arthritis.   NEUROLOGIC: No tingling, numbness, weakness.  PSYCHIATRY: No anxiety or depression.   ROS  DRUG ALLERGIES:   Allergies  Allergen Reactions  . Allopurinol Diarrhea, Other (See Comments) and Nausea And Vomiting    Other Reaction: GI Upset  . Atenolol Other (See Comments)    Other Reaction: bradycardia  . Atorvastatin Other (See Comments)    Other Reaction: muscle aches  . Ramipril Rash  . Rosuvastatin Other (See Comments)    Muscle aches  . Simvastatin Other (See Comments)    Other Reaction: MUSCLE ACHES (Zocor)  . Valsartan Other (See Comments)    Other Reaction: facial swelling  . Cardizem [Diltiazem] Rash    VITALS:  Blood pressure 129/60, pulse 70, temperature 98.8 F (37.1 C), temperature source Oral, resp. rate 19, height 5' 8"  (1.727 m), weight 94.3 kg (207 lb 14.3 oz), SpO2 94 %.  PHYSICAL EXAMINATION:  GENERAL:  72 y.o.-year-old patient lying in the bed with no acute distress.  EYES: Pupils equal, round, reactive to light and accommodation. No scleral icterus. Extraocular muscles intact.  HEENT: Head atraumatic, normocephalic. Oropharynx and nasopharynx clear.   NECK:  Supple, no jugular venous distention. No thyroid enlargement, no tenderness.  LUNGS: Normal breath sounds bilaterally, no wheezing, rales,rhonchi or crepitation. No use of accessory muscles of respiration.  CARDIOVASCULAR: S1, S2 normal. No murmurs, rubs, or gallops.  ABDOMEN: Soft, nontender, nondistended. Bowel sounds present. No organomegaly or mass.  EXTREMITIES: No pedal edema, cyanosis, or clubbing.  NEUROLOGIC: Cranial nerves II through XII are intact. Muscle strength 4/5 in all extremities. Right hip pain. Sensation intact. Gait not checked.  PSYCHIATRIC: The patient is alert and oriented x 3.  SKIN: No obvious rash, lesion, or ulcer.   Physical Exam LABORATORY PANEL:   CBC Recent Labs  Lab 12/16/17 0440  WBC 10.4  HGB 9.6*  HCT 27.5*  PLT 190   ------------------------------------------------------------------------------------------------------------------  Chemistries  Recent Labs  Lab 12/16/17 0440  NA 132*  K 3.5  CL 96*  CO2 28  GLUCOSE 208*  BUN 15  CREATININE 0.96  CALCIUM 8.0*   ------------------------------------------------------------------------------------------------------------------  Cardiac Enzymes No results for input(s): TROPONINI in the last 168 hours. ------------------------------------------------------------------------------------------------------------------  RADIOLOGY:  Mr Jeri Cos Wo Contrast  Result Date: 12/15/2017 CLINICAL DATA:  Follow-up stroke. History of calcified mesenteric lesion, atrial fibrillation, hypertension, hyperlipidemia. EXAM: MRI HEAD WITHOUT AND WITH CONTRAST TECHNIQUE: Multiplanar, multiecho pulse sequences of the brain and surrounding structures were obtained without and with intravenous contrast. CONTRAST:  20m MULTIHANCE GADOBENATE DIMEGLUMINE 529 MG/ML IV SOLN COMPARISON:  CT HEAD December 14, 2017 and MRI of the head October 11, 2014 FINDINGS: INTRACRANIAL CONTENTS: No reduced diffusion to suggest acute  ischemia or hypercellular tumor.  No susceptibility artifact to suggest hemorrhage. Moderate parenchymal brain volume loss. No hydrocephalus. Patchy to confluent supratentorial white matter FLAIR T2 hyperintensities increased from prior MRI. No suspicious parenchymal signal, masses, mass effect. RIGHT temporal lobe developmental venous anomaly suggested on coronal post gadolinium image 19, however not present on post gadolinium axial T1 or prior MRI, most compatible with artifact. No abnormal intraparenchymal or extra-axial enhancement. No abnormal extra-axial fluid collections. No extra-axial masses. VASCULAR: Normal major intracranial vascular flow voids present at skull base. SKULL AND UPPER CERVICAL SPINE: No abnormal sellar expansion. No suspicious calvarial bone marrow signal. Craniocervical junction maintained. SINUSES/ORBITS: Mild paranasal sinus mucosal thickening without air-fluid levels. Small bilateral mastoid effusions.The included ocular globes and orbital contents are non-suspicious. Status post bilateral ocular lens implants. OTHER: None. IMPRESSION: 1. No acute intracranial process. 2. Moderate parenchymal brain volume loss, progressed from 2016. 3. Mild-to-moderate chronic small vessel ischemic disease, progressed from 2016. Electronically Signed   By: Elon Alas M.D.   On: 12/15/2017 17:10   US Carotid Bilateral  Result Date: 12/15/2017 CLINICAL DATA:  Stroke. History of CAD (post CABG), hypertension and diabetes. EXAM: BILATERAL CAROTID DUPLEX ULTRASOUND TECHNIQUE: Pearline Cables scale imaging, color Doppler and duplex ultrasound were performed of bilateral carotid and vertebral arteries in the neck. COMPARISON:  None. FINDINGS: Criteria: Quantification of carotid stenosis is based on velocity parameters that correlate the residual internal carotid diameter with NASCET-based stenosis levels, using the diameter of the distal internal carotid lumen as the denominator for stenosis measurement. The  following velocity measurements were obtained: RIGHT ICA:  100/21 cm/sec CCA:  622/63 cm/sec SYSTOLIC ICA/CCA RATIO:  0.7 ECA:  180 cm/sec LEFT ICA:  126/36 cm/sec CCA:  335/45 cm/sec SYSTOLIC ICA/CCA RATIO:  0.9 ECA:  132 cm/sec RIGHT CAROTID ARTERY: There is a minimal amount of mixed echogenic plaque involving the proximal aspects of the right internal carotid artery (image 22), not resulting in elevated peak systolic velocities within the interrogated course the right internal carotid artery to suggest a hemodynamically significant stenosis. Borderline elevated peak systolic velocity within distal aspect the right internal carotid artery is felt to be factitiously elevated due to sampling at a location of turbulent flow. RIGHT VERTEBRAL ARTERY:  Antegrade Flow LEFT CAROTID ARTERY: There is a minimal amount of atherosclerotic plaque with the left carotid bulb (image 47). There is a minimal to moderate amount of mixed echogenic partially shadowing plaque involving the origin and proximal aspects of the left internal carotid artery (image 54), which results in borderline elevated peak systolic velocities within the proximal and mid aspects of the left internal carotid artery. Borderline elevated peak systolic velocity with the proximal ICA measures 120 cm/sec - image 56. LEFT VERTEBRAL ARTERY:  Antegrade flow IMPRESSION: 1. Minimal to moderate amount of left-sided atherosclerotic plaque results in borderline elevated peak systolic velocities within the left internal carotid artery which approach the 50% luminal narrowing range. Further evaluation with CTA could performed as clinically indicated. 2. Minimal amount of right-sided atherosclerotic plaque, not resulting in a hemodynamically significant stenosis. Electronically Signed   By: Sandi Mariscal M.D.   On: 12/15/2017 17:35   Dg Chest Port 1 View  Result Date: 12/16/2017 CLINICAL DATA:  Difficulty breathing EXAM: PORTABLE CHEST 1 VIEW COMPARISON:  December 15, 2017  FINDINGS: There is patchy atelectasis in the lung bases. There is no frank edema or consolidation. There is cardiomegaly with pulmonary vascularity within normal limits. No adenopathy. Patient is status post median sternotomy. There is postoperative change in the  right shoulder. No adenopathy. There is aortic atherosclerosis. IMPRESSION: Patchy bibasilar atelectasis. No edema or consolidation. Stable cardiomegaly. Aortic atherosclerosis. Aortic Atherosclerosis (ICD10-I70.0). Electronically Signed   By: Lowella Grip III M.D.   On: 12/16/2017 07:34   Dg Chest Port 1 View  Result Date: 12/15/2017 CLINICAL DATA:  Acute respiratory failure EXAM: PORTABLE CHEST 1 VIEW COMPARISON:  12/14/2017 FINDINGS: Postoperative changes in the mediastinum. Shallow inspiration. Cardiac enlargement with pulmonary vascular congestion appears improving since previous study. Linear atelectasis in the left lung base. No definite edema or effusion. No pneumothorax. Calcification of the aorta. Postoperative changes in the right shoulder. IMPRESSION: Cardiac enlargement with improving pulmonary vascularity and congestive changes since previous study. Atelectasis in the left lung base. Aortic atherosclerosis. Electronically Signed   By: Lucienne Capers M.D.   On: 12/15/2017 05:12    ASSESSMENT AND PLAN:   Active Problems:   Osteoarthritis of right hip  72 year old male patient currently under orthopedic service status post a right hip arthroplasty for osteoarthritis, spinal stenosis, coronary artery disease, atrial fibrillation, peripheral neuropathy, diabetes mellitus, hypertension, hyperlipidemia with lethargy and confusion.  1.  Altered mental status could be secondary to sepsis-metabolic encephalopathy.     Still ruled out by negative MRI, patient completely improved and back to baseline.  2.Left-sided weakness rule out CVA Tele neuro consultation. Start aspirin Checked MRI brain, carotid ultrasound and  echocardiogram as per neurology recommendations Currently on Plavix   Weakness improved, pt is oriented X3. MRI brain negative  3.  Sepsis - pneumonia blood cultures- negative so far.  patient on IV vancomycin and IV Zosyn antibiotics Intensivist consultation Is improved, switch antibiotics to oral.  4.  Uncontrolled hypertension Optimize blood pressure medications  5.  Status post right hip arthroplasty for osteoarthritis Orthopedic follow-up  6.  DVT prophylaxis Currently on oral Coumadin follow-up INR- pharmacy to dose.  7.A fib s/p ablation and cardioversion, CAD s/p PCI and CABG, CHF -Rate controlled. -On Coumadin with subtherapeutic INR. -Started on Lovenox as bridging therapy.      All the records are reviewed and case discussed with Care Management/Social Workerr. Management plans discussed with the patient, family and they are in agreement.  CODE STATUS: Full.  TOTAL TIME TAKING CARE OF THIS PATIENT: 45 minutes.     POSSIBLE D/C IN 1-2 DAYS, DEPENDING ON CLINICAL CONDITION.   Vaughan Basta M.D on 12/16/2017   Between 7am to 6pm - Pager - [phone]  After 6pm go to [email or web address] - password [email or web address]  Office  [phone]  CC: Primary care physician; Cassandria Santee, MD  Note: This dictation was prepared with Dragon dictation along with smaller phrase technology. Any transcriptional errors that result from this process are unintentional.

## 2017-12-16 NOTE — Progress Notes (Signed)
Physical Therapy Treatment Patient Details Name: Chase Cabrera MRN: [id number] DOB: Aug 06, 1946 Today's Date: 12/16/2017    History of Present Illness 72 y/o male s/p R hip THR (anterior approprach) 4/3.  Pt had a bout of lethargy and decreased responsiveness - transfered to CCU, is now back on the ortho floor.    PT Comments    Pt continues to be functionally limited but showed good effort with mobility and exercises despite pain and fatigue.  He still is needing excessive effort to move R LE even a little in standing, but is able to do so with heavy encouragement.  Pt very tired with the session, though HR and O2 remain stable and appropriate t/o the effort.  Pt eager to get to rehab and continue working on strength, mobility, etc.     Follow Up Recommendations  SNF     Equipment Recommendations  None recommended by PT    Recommendations for Other Services       Precautions / Restrictions Precautions Precautions: Fall;Anterior Hip Restrictions RLE Weight Bearing: Weight bearing as tolerated    Mobility  Bed Mobility Overal bed mobility: Needs Assistance Bed Mobility: Supine to Sit     Supine to sit: Max assist     General bed mobility comments: Pt needed heavy assist to get back into bed, especially with lifting R LE, heavy help to scoot up in bed as well.  Transfers Overall transfer level: Needs assistance Equipment used: Rolling walker (2 wheeled) Transfers: Sit to/from Stand Sit to Stand: Mod assist;Max assist         General transfer comment: Pt showed great effort in getting to standing and with heavy assist he was able to shift enough to get to standing, still forward bent at hips with inability to achieve fully upright posture  Ambulation/Gait Ambulation/Gait assistance: Mod assist;+2 physical assistance Ambulation Distance (Feet): 3 Feet Assistive device: Rolling walker (2 wheeled)       General Gait Details: Pt highly reliant on UEs/walker and  still having a lot of difficulty with moving/advancing R LE but was able to clear toes and do some movement w/o assist.  Pt very fatigued with the effort, vitals remained stable.   Stairs            Wheelchair Mobility    Modified Rankin (Stroke Patients Only)       Balance Overall balance assessment: Needs assistance Sitting-balance support: Feet supported;Bilateral upper extremity supported Sitting balance-Leahy Scale: Good     Standing balance support: Bilateral upper extremity supported Standing balance-Leahy Scale: Poor Standing balance comment: highly reliant on the walker                            Cognition Arousal/Alertness: Lethargic;Awake/alert Behavior During Therapy: WFL for tasks assessed/performed Overall Cognitive Status: Impaired/Different from baseline(improved and nearing baseline)                                        Exercises Total Joint Exercises Quad Sets: Strengthening;10 reps Gluteal Sets: Strengthening;10 reps Short Arc Quad: Strengthening;AROM;10 reps Heel Slides: AAROM;10 reps Hip ABduction/ADduction: 10 reps;AROM    General Comments        Pertinent Vitals/Pain Pain Score: 5     Home Living  Prior Function            PT Goals (current goals can now be found in the care plan section) Progress towards PT goals: Progressing toward goals    Frequency    BID      PT Plan Current plan remains appropriate    Co-evaluation              AM-PAC PT "6 Clicks" Daily Activity  Outcome Measure  Difficulty turning over in bed (including adjusting bedclothes, sheets and blankets)?: Unable Difficulty moving from lying on back to sitting on the side of the bed? : Unable Difficulty sitting down on and standing up from a chair with arms (e.g., wheelchair, bedside commode, etc,.)?: Unable Help needed moving to and from a bed to chair (including a wheelchair)?: A  Lot Help needed walking in hospital room?: Total Help needed climbing 3-5 steps with a railing? : Total 6 Click Score: 7    End of Session Equipment Utilized During Treatment: Gait belt Activity Tolerance: Patient limited by fatigue;Patient limited by lethargy Patient left: with bed alarm set;with call bell/phone within reach;with family/visitor present Nurse Communication: Mobility status PT Visit Diagnosis: Muscle weakness (generalized) (M62.81);Difficulty in walking, not elsewhere classified (R26.2);Pain Pain - Right/Left: Right Pain - part of body: Hip     Time: 8676-7209 PT Time Calculation (min) (ACUTE ONLY): 31 min  Charges:  $Gait Training: 8-22 mins $Therapeutic Exercise: 8-22 mins                    G Codes:       Kreg Shropshire, DPT 12/16/2017, 4:49 PM

## 2017-12-16 NOTE — Progress Notes (Signed)
Physical Therapy Treatment Patient Details Name: Chase Cabrera MRN: [id number] DOB: [birth year] Today's Date: 12/16/2017    History of Present Illness 72 y/o male s/p R hip THR (anterior approprach) 4/3.  Pt had a bout of lethargy and decreased responsiveness - transfered to CCU, is now back on the ortho floor.    PT Comments    Pt still somewhat lethargic but vastly better than yesterday. He showed good effort t/o the session and despite some pain was well motivated.  He still required some assist with supine exercises (most markedly with hip flexion tasks) but showed more control and strength with these as well as much better tolerance with mobility, sitting and was actually able to take a few small steps with assist and heavy reliance on the walker.  Pt still very limited, but per recent hospital course is doing relatively well.  Follow Up Recommendations  SNF     Equipment Recommendations  None recommended by PT    Recommendations for Other Services       Precautions / Restrictions Precautions Precautions: Fall;Anterior Hip Restrictions RLE Weight Bearing: Weight bearing as tolerated    Mobility  Bed Mobility Overal bed mobility: Needs Assistance Bed Mobility: Supine to Sit     Supine to sit: Mod assist     General bed mobility comments: Pt showed good effort in getting to EOB, he ultimately needed considerable assist but was not so pain limited as to abort the effort today  Transfers Overall transfer level: Needs assistance Equipment used: Rolling walker (2 wheeled) Transfers: Sit to/from Stand Sit to Stand: Mod assist;+2 physical assistance         General transfer comment: Pt with definite need of assist to initiate upward movement, unable to get hips underneath shoulders but using the walker he was able to maintain standing w/o heavy assist  Ambulation/Gait Ambulation/Gait assistance: Mod assist;+2 physical assistance Ambulation Distance (Feet): 5  Feet Assistive device: Rolling walker (2 wheeled)       General Gait Details: Pt very much struggled with any AROM with the R LE but was able to do a few side/turning steps as well as 3 forward steps advancing R LE a few inches w/o direct assist   Stairs            Wheelchair Mobility    Modified Rankin (Stroke Patients Only)       Balance Overall balance assessment: Needs assistance Sitting-balance support: Feet supported;Bilateral upper extremity supported Sitting balance-Leahy Scale: Good     Standing balance support: Bilateral upper extremity supported Standing balance-Leahy Scale: Poor Standing balance comment: highly reliant on the walker                            Cognition Arousal/Alertness: Awake/alert Behavior During Therapy: WFL for tasks assessed/performed Overall Cognitive Status: (much closer to normal this date)                                        Exercises Total Joint Exercises Ankle Circles/Pumps: Strengthening;10 reps Quad Sets: Strengthening;10 reps Gluteal Sets: Strengthening;10 reps Short Arc Quad: AAROM;10 reps;AROM Heel Slides: AAROM;10 reps Hip ABduction/ADduction: 10 reps;AROM    General Comments        Pertinent Vitals/Pain Pain Assessment: 0-10 Pain Score: 6  Pain Location: reports minimal pain at rest, is sensitive to most movement of R  LE    Home Living                      Prior Function            PT Goals (current goals can now be found in the care plan section) Progress towards PT goals: Progressing toward goals    Frequency    BID      PT Plan Current plan remains appropriate    Co-evaluation              AM-PAC PT "6 Clicks" Daily Activity  Outcome Measure  Difficulty turning over in bed (including adjusting bedclothes, sheets and blankets)?: Unable Difficulty moving from lying on back to sitting on the side of the bed? : Unable Difficulty sitting down on  and standing up from a chair with arms (e.g., wheelchair, bedside commode, etc,.)?: Unable Help needed moving to and from a bed to chair (including a wheelchair)?: A Lot Help needed walking in hospital room?: Total Help needed climbing 3-5 steps with a railing? : Total 6 Click Score: 7    End of Session Equipment Utilized During Treatment: Gait belt(pt in the mid 90s t/o session, on 1L then no O2, 95% on RA) Activity Tolerance: Patient limited by lethargy;Patient tolerated treatment well Patient left: with chair alarm set;with call bell/phone within reach;with family/visitor present Nurse Communication: Mobility status PT Visit Diagnosis: Muscle weakness (generalized) (M62.81);Difficulty in walking, not elsewhere classified (R26.2);Pain Pain - Right/Left: Right Pain - part of body: Hip     Time: 3435-6861 PT Time Calculation (min) (ACUTE ONLY): 40 min  Charges:  $Gait Training: 8-22 mins $Therapeutic Exercise: 8-22 mins $Therapeutic Activity: 8-22 mins                    G Codes:       Kreg Shropshire, DPT 12/16/2017, 12:41 PM

## 2017-12-16 NOTE — Consult Note (Signed)
ANTICOAGULATION CONSULT NOTE - Initial Consult  Pharmacy Consult for Warfarin Dosing  Indication: atrial fibrillation and VTE prophylaxis  Allergies  Allergen Reactions  . Allopurinol Diarrhea, Other (See Comments) and Nausea And Vomiting    Other Reaction: GI Upset  . Atenolol Other (See Comments)    Other Reaction: bradycardia  . Atorvastatin Other (See Comments)    Other Reaction: muscle aches  . Ramipril Rash  . Rosuvastatin Other (See Comments)    Muscle aches  . Simvastatin Other (See Comments)    Other Reaction: MUSCLE ACHES (Zocor)  . Valsartan Other (See Comments)    Other Reaction: facial swelling  . Cardizem [Diltiazem] Rash   Patient Measurements: Height: 5' 8"  (172.7 cm) Weight: 207 lb 14.3 oz (94.3 kg) IBW/kg (Calculated) : 68.4  Vital Signs: Temp: 98.7 F (37.1 C) (04/05 0742) Temp Source: Oral (04/05 0742) BP: 139/56 (04/05 0742) Pulse Rate: 64 (04/05 0742)  Labs: Recent Labs    12/14/17 0448  12/14/17 1016 12/14/17 1108 12/14/17 1832 12/15/17 0728 12/16/17 0440  HGB  --    < > 11.5*  --  11.7*  --  9.6*  HCT  --   --  33.8*  --  34.2*  --  27.5*  PLT  --   --  222  --  204  --  190  APTT  --   --   --   --  30  --   --   LABPROT 14.6  --   --   --   --  13.6 15.3*  INR 1.15  --   --   --   --  1.05 1.22  CREATININE  --   --   --  1.06 0.93  --  0.96   < > = values in this interval not displayed.   Estimated Creatinine Clearance: 76.4 mL/min (by C-G formula based on SCr of 0.96 mg/dL).  Medical History: Past Medical History:  Diagnosis Date  . Arthritis   . Atrial fibrillation ([place])   . CAD (coronary artery disease)    6 STENTS  . CHF (congestive heart failure) ([place])   . Chicken pox   . Colon polyp   . Corneal dystrophy    bilateral  . Crohn's disease ([place])   . Diabetes ([place]) 2002  . Dysrhythmia    A-FIB AND PAF  . GERD (gastroesophageal reflux disease)   . Gout   . History of kidney stones   . History of shingles   .  Hyperlipemia   . Hypertension   . Kidney stones   . Myocardial infarction ([place])    x4, last one in 2010  . Peripheral neuropathy   . Peripheral neuropathy   . Peripheral neuropathy   . PUD (peptic ulcer disease)   . Renal artery stenosis ([place])   . Renal artery stenosis ([place])   . Salzmann's nodular dystrophy 2012  . Sleep apnea   . Spinal stenosis     Assessment: Pharmacy consulted for Warfarin dosing and monitoring for VTE prophylaxis in 72 yo male with PMH of A. Fib. Patient admitted S/P right total hip arthoplasty.   Home Regimen: Warfarin 29m: T,W, Th, F, Sa                             Warfarin 357m Su, M  DATE INR DOSE 4/2 1.12       Not given?  4/3  1.15       Not given? 4/4 1.05       85m  4/5       1.22     Goal of Therapy:  INR 2-3 Monitor platelets by anticoagulation protocol: Yes   Plan:  Patient did not receive warfarin last night. Will order warfarin 447mdaily and monitor closely. Will switch to home regimen if patient is becoming supratherapeutic on warfarin 74m73mo daily.  INR daily per protocol Pharmacy will continue to follow and adjust per consult.   GarThomasenia SalesharmD, MBA, [facility] 12/16/2017 8:06 AM

## 2017-12-16 NOTE — Progress Notes (Addendum)
Plan is for patient to D/C to [facility] tomorrow pending medical clearance. [name] is aware of above. Patient and his wife [name] are aware of above. Clinical Education officer, museum (CSW) sent D/C summary to [facility] via [facility] today.   [employer], LCSW [phone]

## 2017-12-16 NOTE — Care Management Important Message (Signed)
Important Message  Patient Details  Name: Chase Cabrera MRN: [id number] Date of Birth: [birth year]   Medicare Important Message Given:  Yes    Juliann Pulse A Tabatha Razzano 12/16/2017, 10:27 AM

## 2017-12-16 NOTE — Discharge Summary (Addendum)
Physician Discharge Summary  Patient ID: Chase Cabrera MRN: [id number] DOB/AGE: 74-31-1947 74 y.o.  Admit date: 12/13/2017 Discharge date: 12/17/2017  Admission Diagnoses:  primary osteoarthritis of right hip  Discharge Diagnoses: Patient Active Problem List   Diagnosis Date Noted  . Osteoarthritis of right hip 12/13/2017  . Intraabdominal mass 11/23/2017  . Colonic mass 11/18/2017  . Crohn's disease ([place]) 10/20/2017  . Pulmonary nodule 10/07/2017  . History of skin cancer 10/07/2017  . Rib pain on right side 03/07/2017  . Upper respiratory tract infection 09/09/2016  . Osteoarthritis 04/08/2016  . Spinal stenosis, lumbar 03/25/2016  . DM type 2 with diabetic peripheral neuropathy ([place]) 07/15/2014  . Gout 05/04/2013  . Long term current use of anticoagulant 07/29/2011  . Renal artery stenosis ([place]) 07/01/2011  . Atrial fibrillation ([place]) 06/30/2011  . CAD (coronary artery disease) 06/30/2011  . Hyperlipidemia 06/30/2011  . Essential hypertension 06/30/2011    Past Medical History:  Diagnosis Date  . Arthritis   . Atrial fibrillation ([place])   . CAD (coronary artery disease)    6 STENTS  . CHF (congestive heart failure) ([place])   . Chicken pox   . Colon polyp   . Corneal dystrophy    bilateral  . Crohn's disease ([place])   . Diabetes ([place]) 2002  . Dysrhythmia    A-FIB AND PAF  . GERD (gastroesophageal reflux disease)   . Gout   . History of kidney stones   . History of shingles   . Hyperlipemia   . Hypertension   . Kidney stones   . Myocardial infarction ([place])    x4, last one in 2010  . Peripheral neuropathy   . Peripheral neuropathy   . Peripheral neuropathy   . PUD (peptic ulcer disease)   . Renal artery stenosis ([place])   . Renal artery stenosis ([place])   . Salzmann's nodular dystrophy 2012  . Sleep apnea   . Spinal stenosis      Transfusion: none   Consultants (if any): Treatment Team:  Alexis Goodell, MD  Discharged Condition: Improved  Hospital  Course: Chase Cabrera is an 72 y.o. male who was admitted 12/13/2017 with a diagnosis of right hip osteoarthritis and went to the operating room on 12/13/2017 and underwent the above named procedures.    Surgeries: Procedure(s): TOTAL HIP ARTHROPLASTY ANTERIOR APPROACH on 12/13/2017 Patient tolerated the surgery well. Taken to PACU where she was stabilized and then transferred to the orthopedic floor.  Started on plavix and coumadin. Foot pumps applied bilaterally at 80 mm. Heels elevated on bed with rolled towels. No evidence of DVT. Negative [place]. Physical therapy started on day #1 for gait training and transfer. OT started day #1 for ADL and assisted devices.  Patient's foley was d/c on day #1. Patient's IV  was d/c on day #2. Patient noted to be febrile on post op day 2. He later developed decreased responsiveness and was sent to ICU. Stroke work up negative. CXR showed atelectasis. Patient started on IV abx.   On post op day #3 patient was moved to orthopedic floor. Patient alert. Pain well controlled. IV abx continued.  On 12/17/17, the patient was cleared for discharge to rehab, recommended continued to bridge with Lovenox 25m daily following surgery and recheck PT/INR on Monday.  Will continue oral antibiotics for 5 days following discharge from the hospital.  Implants: Medacta AMIS 6 lat stem, 52 mm Mpact cup DM with liner, S 28 mm metal head  Please remove provena  negative pressure dressing on 12/25/2017 and apply honey comb dressing. Keep dressing clean and dry at all times.  Recheck PT/INR on Monday, 12/19/17  He was given perioperative antibiotics:  Anti-infectives (From admission, onward)   Start     Dose/Rate Route Frequency Ordered Stop   12/17/17 0000  amoxicillin-clavulanate (AUGMENTIN) 875-125 MG tablet  Status:  Discontinued     1 tablet Oral Every 12 hours 12/17/17 0909 12/17/17    12/17/17 0000  amoxicillin-clavulanate (AUGMENTIN) 875-125 MG tablet     1 tablet Oral Every  12 hours 12/17/17 0935 12/22/17 2359   12/16/17 1500  amoxicillin-clavulanate (AUGMENTIN) 875-125 MG per tablet 1 tablet     1 tablet Oral Every 12 hours 12/16/17 1446     12/15/17 0200  vancomycin (VANCOCIN) IVPB 1000 mg/200 mL premix  Status:  Discontinued     1,000 mg 200 mL/hr over 60 Minutes Intravenous Every 12 hours 12/14/17 2015 12/16/17 1446   12/14/17 2000  piperacillin-tazobactam (ZOSYN) IVPB 3.375 g  Status:  Discontinued     3.375 g 12.5 mL/hr over 240 Minutes Intravenous Every 8 hours 12/14/17 1831 12/16/17 1446   12/14/17 1845  vancomycin (VANCOCIN) IVPB 1000 mg/200 mL premix     1,000 mg 200 mL/hr over 60 Minutes Intravenous  Once 12/14/17 1831 12/14/17 2052   12/13/17 2100  ceFAZolin (ANCEF) IVPB 2g/100 mL premix     2 g 200 mL/hr over 30 Minutes Intravenous Every 6 hours 12/13/17 1814 12/14/17 0950   12/13/17 1143  ceFAZolin (ANCEF) 2-4 GM/100ML-% IVPB    Note to Pharmacy:  Milinda Cave   : cabinet override      12/13/17 1143 12/13/17 1513   12/12/17 2230  ceFAZolin (ANCEF) IVPB 2g/100 mL premix     2 g 200 mL/hr over 30 Minutes Intravenous  Once 12/12/17 2220 12/13/17 1543    .  He was given sequential compression devices, early ambulation, and coumadin/plavix for DVT prophylaxis.  He benefited maximally from the hospital stay and there were no complications.    Recent vital signs:  Vitals:   12/17/17 0100 12/17/17 0734  BP:  134/71  Pulse:  65  Resp:  18  Temp: 99 F (37.2 C) 99.2 F (37.3 C)  SpO2:  95%    Recent laboratory studies:  Lab Results  Component Value Date   HGB 9.6 (L) 12/16/2017   HGB 11.7 (L) 12/14/2017   HGB 11.5 (L) 12/14/2017   Lab Results  Component Value Date   WBC 10.4 12/16/2017   PLT 190 12/16/2017   Lab Results  Component Value Date   INR 1.25 12/17/2017   Lab Results  Component Value Date   NA 132 (L) 12/16/2017   K 3.5 12/16/2017   CL 96 (L) 12/16/2017   CO2 28 12/16/2017   BUN 15 12/16/2017   CREATININE  0.96 12/16/2017   GLUCOSE 208 (H) 12/16/2017    Discharge Medications:      Reactions   Allopurinol Diarrhea, Other (See Comments), Nausea And Vomiting   Other Reaction: GI Upset   Atenolol Other (See Comments)   Other Reaction: bradycardia   Atorvastatin Other (See Comments)   Other Reaction: muscle aches   Ramipril Rash   Rosuvastatin Other (See Comments)   Muscle aches   Simvastatin Other (See Comments)   Other Reaction: MUSCLE ACHES (Zocor)   Valsartan Other (See Comments)   Other Reaction: facial swelling   Cardizem [diltiazem] Rash      Medication List  STOP taking these medications   oxyCODONE-acetaminophen 5-325 MG tablet Commonly known as:  PERCOCET/ROXICET     TAKE these medications   amLODipine 10 MG tablet Commonly known as:  NORVASC TAKE 1 TABLET BY MOUTH ONCE DAILY What changed:    how much to take  how to take this  when to take this   amoxicillin-clavulanate 875-125 MG tablet Commonly known as:  AUGMENTIN Take 1 tablet by mouth every 12 (twelve) hours for 5 days.   azelastine 0.1 % nasal spray Commonly known as:  ASTELIN Place 2 sprays into both nostrils 2 (two) times daily. Use in each nostril as directed What changed:    when to take this  additional instructions   BENTYL 10 MG capsule Generic drug:  dicyclomine Take 10 mg by mouth 3 (three) times daily before meals.   carvedilol 25 MG tablet Commonly known as:  COREG Take 1 tablet (25 mg total) by mouth 2 (two) times daily with a meal.   Cholecalciferol 50000 units Tabs Take 1 tablet by mouth once a week. What changed:    how much to take  when to take this   clopidogrel 75 MG tablet Commonly known as:  PLAVIX Take 75 mg by mouth daily.   colchicine 0.6 MG tablet TAKE 1 TABLET BY MOUTH ONCE DAILY What changed:    how much to take  how to take this  when to take this   diclofenac sodium 1 % Gel Commonly known as:  VOLTAREN Apply 2 g topically 4 (four) times  daily as needed (for pain).   docusate sodium 100 MG capsule Commonly known as:  COLACE Take 1 capsule (100 mg total) by mouth 2 (two) times daily.   enoxaparin 40 MG/0.4ML injection Commonly known as:  LOVENOX Inject 0.4 mLs (40 mg total) into the skin daily.   gabapentin 300 MG capsule Commonly known as:  NEURONTIN take 1 capsule by mouth three times a day What changed:  See the new instructions.   hydrALAZINE 100 MG tablet Commonly known as:  APRESOLINE TAKE 1 TABLET BY MOUTH THREE TIMES A DAY What changed:    how much to take  how to take this  when to take this   HYDROcodone-acetaminophen 7.5-325 MG tablet Commonly known as:  NORCO Take 1-2 tablets by mouth every 4 (four) hours as needed for severe pain (pain score 7-10).   hydrocortisone 2.5 % cream Apply topically 2 (two) times daily. What changed:    how much to take  when to take this  reasons to take this   insulin lispro 100 UNIT/ML KiwkPen Commonly known as:  HUMALOG Inject 15-30 units SQ up to three times daily per sliding scale   isosorbide mononitrate 120 MG 24 hr tablet Commonly known as:  IMDUR TAKE 1 TABLET BY MOUTH EVERY DAY What changed:    how much to take  how to take this  when to take this   LEVEMIR FLEXTOUCH 100 UNIT/ML Pen Generic drug:  Insulin Detemir Inject 90 Units into the skin daily at 10 pm.   mesalamine 1.2 g EC tablet Commonly known as:  LIALDA Take 2.4 g by mouth daily with breakfast. [facility]   metFORMIN 1000 MG tablet Commonly known as:  GLUCOPHAGE Take 1,000 mg by mouth 2 (two) times daily.   MULTI-VITAMINS Tabs Take 1 tablet by mouth daily.   nitroGLYCERIN 0.4 MG SL tablet Commonly known as:  NITROSTAT Place 1 tablet (0.4 mg total) under the tongue  every 5 (five) minutes x 3 doses as needed. For chest pain.  Call 911 if no relief after 3 tablets   omeprazole 40 MG capsule Commonly known as:  PRILOSEC Take 40 mg by mouth 2 (two) times daily.    pravastatin 40 MG tablet Commonly known as:  PRAVACHOL TAKE 1 TABLET BY MOUTH ONCE DAILY What changed:    how much to take  how to take this  when to take this   probenecid 500 MG tablet Commonly known as:  BENEMID Take 500 mg by mouth 2 (two) times daily.   vitamin C 500 MG tablet Commonly known as:  ASCORBIC ACID Take 500 mg by mouth daily.   Vitamin E 400 units Tabs Take 400 Units by mouth 2 (two) times daily.   warfarin 3 MG tablet Commonly known as:  COUMADIN 1 tablet  Monday, Wednesday, Friday. What changed:    how much to take  how to take this  when to take this  additional instructions   warfarin 4 MG tablet Commonly known as:  COUMADIN 1 tablet Tuesday, Thursday, Saturday, and Sunday. What changed:    how much to take  how to take this  when to take this  additional instructions       Diagnostic Studies: Dg Chest 1 View  Result Date: 12/14/2017 CLINICAL DATA:  Code stroke.  Febrile. EXAM: CHEST  1 VIEW COMPARISON:  Chest radiograph October 12, 2017, PET CT December 08, 2017 FINDINGS: The cardiac silhouette is moderately enlarged, increased from prior examination. Pulmonary vascular congestion interstitial prominence increased from prior examination. Multiple pulmonary nodules/granulomas. Status post median sternotomy for CABG, coronary artery stent. No pneumothorax. Osteopenia. Soft tissue planes are unchanged. IMPRESSION: Worsening cardiomegaly with pulmonary vascular congestion/interstitial prominence compatible with pulmonary edema. Multiple pulmonary nodules, please see PET CT November 30, 2017 for dedicated findings. Electronically Signed   By: Elon Alas M.D.   On: 12/14/2017 18:41   Ct Head Wo Contrast  Result Date: 12/14/2017 CLINICAL DATA:  72 y/o M; increased blood pressure and decreased level of consciousness. Inability to swallow. Tremor in arms. EXAM: CT HEAD WITHOUT CONTRAST TECHNIQUE: Contiguous axial images were obtained from the  base of the skull through the vertex without intravenous contrast. COMPARISON:  10/11/2014 MRI of the head. FINDINGS: Brain: No evidence of acute infarction, hemorrhage, hydrocephalus, extra-axial collection or mass lesion/mass effect. Stable mild chronic microvascular ischemic changes and parenchymal volume loss of the brain. Vascular: Calcific atherosclerosis of the carotid siphons. No hyperdense vessel. Skull: Normal. Negative for fracture or focal lesion. Sinuses/Orbits: Mild diffuse paranasal sinus mucosal thickening and partial opacification of left anterior ethmoid air cells. Normal aeration of mastoid air cells. Bilateral intra-ocular lens replacement. Other: None. IMPRESSION: 1. No acute intracranial abnormality identified. 2. Stable mild chronic microvascular ischemic changes and parenchymal volume loss of the brain. 3. Mild paranasal sinus disease. Electronically Signed   By: Kristine Garbe M.D.   On: 12/14/2017 18:06   Mr Jeri Cos YI Contrast  Result Date: 12/15/2017 CLINICAL DATA:  Follow-up stroke. History of calcified mesenteric lesion, atrial fibrillation, hypertension, hyperlipidemia. EXAM: MRI HEAD WITHOUT AND WITH CONTRAST TECHNIQUE: Multiplanar, multiecho pulse sequences of the brain and surrounding structures were obtained without and with intravenous contrast. CONTRAST:  69m MULTIHANCE GADOBENATE DIMEGLUMINE 529 MG/ML IV SOLN COMPARISON:  CT HEAD December 14, 2017 and MRI of the head October 11, 2014 FINDINGS: INTRACRANIAL CONTENTS: No reduced diffusion to suggest acute ischemia or hypercellular tumor. No susceptibility artifact to suggest  hemorrhage. Moderate parenchymal brain volume loss. No hydrocephalus. Patchy to confluent supratentorial white matter FLAIR T2 hyperintensities increased from prior MRI. No suspicious parenchymal signal, masses, mass effect. RIGHT temporal lobe developmental venous anomaly suggested on coronal post gadolinium image 19, however not present on post  gadolinium axial T1 or prior MRI, most compatible with artifact. No abnormal intraparenchymal or extra-axial enhancement. No abnormal extra-axial fluid collections. No extra-axial masses. VASCULAR: Normal major intracranial vascular flow voids present at skull base. SKULL AND UPPER CERVICAL SPINE: No abnormal sellar expansion. No suspicious calvarial bone marrow signal. Craniocervical junction maintained. SINUSES/ORBITS: Mild paranasal sinus mucosal thickening without air-fluid levels. Small bilateral mastoid effusions.The included ocular globes and orbital contents are non-suspicious. Status post bilateral ocular lens implants. OTHER: None. IMPRESSION: 1. No acute intracranial process. 2. Moderate parenchymal brain volume loss, progressed from 2016. 3. Mild-to-moderate chronic small vessel ischemic disease, progressed from 2016. Electronically Signed   By: Elon Alas M.D.   On: 12/15/2017 17:10   US Carotid Bilateral  Result Date: 12/15/2017 CLINICAL DATA:  Stroke. History of CAD (post CABG), hypertension and diabetes. EXAM: BILATERAL CAROTID DUPLEX ULTRASOUND TECHNIQUE: Pearline Cables scale imaging, color Doppler and duplex ultrasound were performed of bilateral carotid and vertebral arteries in the neck. COMPARISON:  None. FINDINGS: Criteria: Quantification of carotid stenosis is based on velocity parameters that correlate the residual internal carotid diameter with NASCET-based stenosis levels, using the diameter of the distal internal carotid lumen as the denominator for stenosis measurement. The following velocity measurements were obtained: RIGHT ICA:  100/21 cm/sec CCA:  580/99 cm/sec SYSTOLIC ICA/CCA RATIO:  0.7 ECA:  180 cm/sec LEFT ICA:  126/36 cm/sec CCA:  833/82 cm/sec SYSTOLIC ICA/CCA RATIO:  0.9 ECA:  132 cm/sec RIGHT CAROTID ARTERY: There is a minimal amount of mixed echogenic plaque involving the proximal aspects of the right internal carotid artery (image 22), not resulting in elevated peak  systolic velocities within the interrogated course the right internal carotid artery to suggest a hemodynamically significant stenosis. Borderline elevated peak systolic velocity within distal aspect the right internal carotid artery is felt to be factitiously elevated due to sampling at a location of turbulent flow. RIGHT VERTEBRAL ARTERY:  Antegrade Flow LEFT CAROTID ARTERY: There is a minimal amount of atherosclerotic plaque with the left carotid bulb (image 47). There is a minimal to moderate amount of mixed echogenic partially shadowing plaque involving the origin and proximal aspects of the left internal carotid artery (image 54), which results in borderline elevated peak systolic velocities within the proximal and mid aspects of the left internal carotid artery. Borderline elevated peak systolic velocity with the proximal ICA measures 120 cm/sec - image 56. LEFT VERTEBRAL ARTERY:  Antegrade flow IMPRESSION: 1. Minimal to moderate amount of left-sided atherosclerotic plaque results in borderline elevated peak systolic velocities within the left internal carotid artery which approach the 50% luminal narrowing range. Further evaluation with CTA could performed as clinically indicated. 2. Minimal amount of right-sided atherosclerotic plaque, not resulting in a hemodynamically significant stenosis. Electronically Signed   By: Sandi Mariscal M.D.   On: 12/15/2017 17:35   Nm Pet Image Initial (pi) Skull Base To Thigh  Result Date: 11/30/2017 CLINICAL DATA:  Initial treatment strategy for partially calcified mesenteric lesion. EXAM: NUCLEAR MEDICINE PET SKULL BASE TO THIGH TECHNIQUE: 11.0 mCi F-18 FDG was injected intravenously. Full-ring PET imaging was performed from the skull base to thigh after the radiotracer. CT data was obtained and used for attenuation correction and anatomic localization. Fasting blood  glucose: 113 mg/dl Mediastinal blood pool activity: SUV max 2.4 COMPARISON:  CT abdomen/pelvis dated  11/16/2017 FINDINGS: NECK: No hypermetabolic cervical lymphadenopathy. Incidental CT findings: none CHEST: No hypermetabolic thoracic lymphadenopathy. Calcified mediastinal and left perihilar nodes. No hypermetabolic pulmonary nodules. Calcified granulomata bilaterally, benign. Incidental CT findings: Cardiomegaly. Atherosclerotic calcifications of the aortic arch. Three vessel coronary atherosclerosis. Postsurgical changes related to prior CABG. ABDOMEN/PELVIS: 2.1 cm partially calcified mesenteric lesion adjacent to the proximal sigmoid colon (series 3/image 215), with mild hypermetabolism, max SUV 2.5. Additional 11 mm partially calcified soft tissue nodule beneath the anterior abdominal wall (series 3/image 166) and 8 mm soft tissue nodule beneath the left lateral abdominal wall (series 3/image 21), without appreciable hypermetabolism. No hypermetabolic abdominopelvic lymphadenopathy. No abnormal hypermetabolism in the liver, spleen, pancreas, or adrenal glands. Incidental CT findings: Hepatic steatosis. Prior cholecystectomy. Calcified hepatic and splenic granulomata. 10 mm medial right upper pole renal cyst (series 3/image 168). 1.8 cm right lower pole renal cyst. 4 mm nonobstructing right lower pole renal calculus. Prostatomegaly. Thick-walled bladder. SKELETON: No focal hypermetabolic activity to suggest skeletal metastasis. Incidental CT findings: Degenerative changes of the visualized thoracolumbar spine. Status post L3-S1 PLIF. Status post left hip arthroplasty, without evidence of complication. IMPRESSION: 2.1 cm partially calcified lesion adjacent to the proximal sigmoid colon demonstrates only mild hypermetabolism. As such, peritoneal disease is not favored. Carcinoid remains possible, although this typically is located in the small bowel mesentery; correlation with laboratory evaluation is suggested. Given the additional findings, a benign etiology related to granulomatous disease should also be  considered; assuming laboratory workup is negative, consider follow-up CT in 6 months to document stability. Two additional peritoneal nodules measuring up to 11 mm are present, as described above. These do not demonstrate appreciable hypermetabolism. Attention on follow-up is likely sufficient. Additional ancillary findings as above. Electronically Signed   By: Julian Hy M.D.   On: 11/30/2017 16:53   Dg Chest Port 1 View  Result Date: 12/16/2017 CLINICAL DATA:  Difficulty breathing EXAM: PORTABLE CHEST 1 VIEW COMPARISON:  December 15, 2017 FINDINGS: There is patchy atelectasis in the lung bases. There is no frank edema or consolidation. There is cardiomegaly with pulmonary vascularity within normal limits. No adenopathy. Patient is status post median sternotomy. There is postoperative change in the right shoulder. No adenopathy. There is aortic atherosclerosis. IMPRESSION: Patchy bibasilar atelectasis. No edema or consolidation. Stable cardiomegaly. Aortic atherosclerosis. Aortic Atherosclerosis (ICD10-I70.0). Electronically Signed   By: Lowella Grip III M.D.   On: 12/16/2017 07:34   Dg Chest Port 1 View  Result Date: 12/15/2017 CLINICAL DATA:  Acute respiratory failure EXAM: PORTABLE CHEST 1 VIEW COMPARISON:  12/14/2017 FINDINGS: Postoperative changes in the mediastinum. Shallow inspiration. Cardiac enlargement with pulmonary vascular congestion appears improving since previous study. Linear atelectasis in the left lung base. No definite edema or effusion. No pneumothorax. Calcification of the aorta. Postoperative changes in the right shoulder. IMPRESSION: Cardiac enlargement with improving pulmonary vascularity and congestive changes since previous study. Atelectasis in the left lung base. Aortic atherosclerosis. Electronically Signed   By: Lucienne Capers M.D.   On: 12/15/2017 05:12   Dg Hip Operative Unilat W Or W/o Pelvis Right  Result Date: 12/13/2017 CLINICAL DATA:  Status post right total  hip joint replacing using an anterior approach. Reported fluoro time is 12 seconds. EXAM: OPERATIVE right HIP (WITH PELVIS IF PERFORMED) 2 VIEWS TECHNIQUE: Fluoroscopic spot image(s) were submitted for interpretation post-operatively. COMPARISON:  CT scan of the abdomen and pelvis of November 16, 2017 which included coronal and sagittal reconstructions through the pelvis. FINDINGS: The patient has undergone anterior approach right hip joint prosthesis placement. The distal aspect of the femoral component is not included in the field of view. No immediate postprocedure complication is observed. IMPRESSION: Patient has undergone anterior approach right total hip joint prosthesis placement. Electronically Signed   By: David  Martinique M.D.   On: 12/13/2017 16:27   Dg Hip Unilat W Or W/o Pelvis 2-3 Views Right  Result Date: 12/13/2017 CLINICAL DATA:  Status post right hip replacement today. EXAM: DG HIP (WITH OR WITHOUT PELVIS) 2-3V RIGHT COMPARISON:  None. FINDINGS: Right total hip arthroplasty is in place. The device is located. No fracture. Surgical staples and drain noted. IMPRESSION: Status post right hip replacement.  No acute finding. Electronically Signed   By: Inge Rise M.D.   On: 12/13/2017 17:10    Disposition:      Contact information for follow-up providers    Hessie Knows, MD On 12/28/2017.   Specialty:  Orthopedic Surgery Why:  @ 2:45 pm Contact information: [place] [id number] [phone]            Contact information for after-discharge care    Destination    [facility] .   Service:  Skilled Nursing Contact information: [address] [place] [phone]                 Signed: Judson Roch PA-C 12/17/2017, 9:38 AM

## 2017-12-16 NOTE — Consult Note (Signed)
Referring Physician: Anselm Jungling    Chief Complaint: AMS  HPI: Chase Cabrera is an 72 y.o. male s/p elective right hip arthroplasty who on post-op day#1 was noted to have a change in mental status and fever.  Patient was unable to follow commands and was not answering questions.  Was unable to swallow.  Code stroke was called at that time.  Initial NIHSS of 9.   Date last known well: Date: 12/14/2017 Time last known well: Time: 14:00 tPA Given: No: Recent surgery  Past Medical History:  Diagnosis Date  . Arthritis   . Atrial fibrillation ([place])   . CAD (coronary artery disease)    6 STENTS  . CHF (congestive heart failure) ([place])   . Chicken pox   . Colon polyp   . Corneal dystrophy    bilateral  . Crohn's disease ([place])   . Diabetes ([place]) 2002  . Dysrhythmia    A-FIB AND PAF  . GERD (gastroesophageal reflux disease)   . Gout   . History of kidney stones   . History of shingles   . Hyperlipemia   . Hypertension   . Kidney stones   . Myocardial infarction ([place])    x4, last one in 2010  . Peripheral neuropathy   . Peripheral neuropathy   . Peripheral neuropathy   . PUD (peptic ulcer disease)   . Renal artery stenosis ([place])   . Renal artery stenosis ([place])   . Salzmann's nodular dystrophy 2012  . Sleep apnea   . Spinal stenosis     Past Surgical History:  Procedure Laterality Date  . ABLATION  2012  . APPLICATION VERTERBRAL DEFECT PROSTHETIC  05/01/2013  . ARTHRODESIS ANTERIOR LUMBAR SPINE  05/01/2013  . BACK SURGERY     lumbar fusion  . CARDIAC CATHETERIZATION    . CARDIAC ELECTROPHYSIOLOGY STUDY AND ABLATION    . CARDIAC SURGERY    . CARDIOVERSION    . CHOLECYSTECTOMY    . COLONOSCOPY WITH PROPOFOL N/A 04/09/2016   Completed for chronic diarrhea.  Few scattered diverticuli.  No mucosal lesions.  Surgeon: Lollie Sails, MD;  Location: [facility] ENDOSCOPY;  Service: Endoscopy;  Laterality: N/A;  . CORNEAL EYE SURGERY Bilateral 07/28/11    09/22/2011  . CORONARY  ANGIOPLASTY    . CORONARY ANGIOPLASTY WITH STENT PLACEMENT  03/28/2015   Distal 80% to normal Stent, Dilation Balloon  . CORONARY ARTERY BYPASS GRAFT     4 VESSELS  . EYE SURGERY     bilateral cataract  . foot and ankle repair Right 2005  . FRACTURE SURGERY     ANKLE PLATE AND SCREWS  . GALLBLADER    . JOINT REPLACEMENT     left total hip 11/11/15  . KNEE ARTHROSCOPY Right   . LUMBAR SPINE FUSION ONE LEVEL  05/03/2013  . RENAL ARTERY STENT Right   . ROTATOR CUFF REPAIR Right 2006  . TONSILLECTOMY    . TOTAL HIP ARTHROPLASTY Left 11/11/2015   Procedure: TOTAL HIP ARTHROPLASTY ANTERIOR APPROACH;  Surgeon: Hessie Knows, MD;  Location: [facility] ORS;  Service: Orthopedics;  Laterality: Left;  . TOTAL HIP ARTHROPLASTY Right 12/13/2017   Procedure: TOTAL HIP ARTHROPLASTY ANTERIOR APPROACH;  Surgeon: Hessie Knows, MD;  Location: [facility] ORS;  Service: Orthopedics;  Laterality: Right;  . TRANSCATH PLACEMENT INTRAVASCULAR STENT LEG  03/2015    Family History  Problem Relation Age of Onset  . CVA Father   . Stroke Father   . Lung cancer Mother   .  Arthritis Mother   . Stomach cancer Sister   . Colon cancer Sister   . Diabetes Brother    Social History:  reports that he quit smoking about 55 years ago. His smoking use included cigarettes. He has a 3.00 pack-year smoking history. He quit smokeless tobacco use about 49 years ago. His smokeless tobacco use included chew. He reports that he does not drink alcohol or use drugs.  Allergies:  Allergies  Allergen Reactions  . Allopurinol Diarrhea, Other (See Comments) and Nausea And Vomiting    Other Reaction: GI Upset  . Atenolol Other (See Comments)    Other Reaction: bradycardia  . Atorvastatin Other (See Comments)    Other Reaction: muscle aches  . Ramipril Rash  . Rosuvastatin Other (See Comments)    Muscle aches  . Simvastatin Other (See Comments)    Other Reaction: MUSCLE ACHES (Zocor)  . Valsartan Other (See Comments)    Other Reaction:  facial swelling  . Cardizem [Diltiazem] Rash    Medications:  I have reviewed the patient's current medications. Prior to Admission:  Medications Prior to Admission  Medication Sig Dispense Refill Last Dose  . amLODipine (NORVASC) 10 MG tablet TAKE 1 TABLET BY MOUTH ONCE DAILY (Patient taking differently: Take 10 mg by mouth at bedtime) 30 tablet 0 12/13/2017 at Unknown time  . azelastine (ASTELIN) 0.1 % nasal spray Place 2 sprays into both nostrils 2 (two) times daily. Use in each nostril as directed (Patient taking differently: Place 2 sprays into both nostrils daily. Use in each nostril as directed) 30 mL 1 12/12/2017 at Unknown time  . carvedilol (COREG) 25 MG tablet Take 1 tablet (25 mg total) by mouth 2 (two) times daily with a meal. 180 tablet 1 12/13/2017 at 0930  . Cholecalciferol 50000 units TABS Take 1 tablet by mouth once a week. (Patient taking differently: Take 50,000 Units by mouth every Saturday. ) 13 tablet 1 12/12/2017 at Unknown time  . clopidogrel (PLAVIX) 75 MG tablet Take 75 mg by mouth daily.   12/06/2017  . colchicine 0.6 MG tablet TAKE 1 TABLET BY MOUTH ONCE DAILY (Patient taking differently: TAKE 0.6 MG BY MOUTH ONCE DAILY) 60 tablet 0 Taking  . diclofenac sodium (VOLTAREN) 1 % GEL Apply 2 g topically 4 (four) times daily as needed (for pain).    12/12/2017 at Unknown time  . dicyclomine (BENTYL) 10 MG capsule Take 10 mg by mouth 3 (three) times daily before meals.    12/12/2017 at Unknown time  . gabapentin (NEURONTIN) 300 MG capsule take 1 capsule by mouth three times a day (Patient taking differently: Take 600 mg by mouth 3 times daily) 90 capsule 1 12/13/2017 at Unknown time  . hydrALAZINE (APRESOLINE) 100 MG tablet TAKE 1 TABLET BY MOUTH THREE TIMES A DAY (Patient taking differently: TAKE 100 MG BY MOUTH THREE TIMES A DAY) 90 tablet 0 12/12/2017 at Unknown time  . hydrocortisone 2.5 % cream Apply topically 2 (two) times daily. (Patient taking differently: Apply 1 application  topically daily as needed (rash behind ear). ) 30 g 0 Past Week at Unknown time  . insulin lispro (HUMALOG) 100 UNIT/ML KiwkPen Inject 15-30 units SQ up to three times daily per sliding scale   Taking  . isosorbide mononitrate (IMDUR) 120 MG 24 hr tablet TAKE 1 TABLET BY MOUTH EVERY DAY (Patient taking differently: TAKE 120 MG BY MOUTH EVERY DAY) 90 tablet 1 Taking  . LEVEMIR FLEXTOUCH 100 UNIT/ML Pen Inject 90 Units into the  skin daily at 10 pm.   1 12/12/2017 at Unknown time  . mesalamine (LIALDA) 1.2 g EC tablet Take 2.4 g by mouth daily with breakfast. [facility]   12/12/2017 at Unknown time  . metFORMIN (GLUCOPHAGE) 1000 MG tablet Take 1,000 mg by mouth 2 (two) times daily.   0 12/11/2017  . Multiple Vitamin (MULTI-VITAMINS) TABS Take 1 tablet by mouth daily.   12/12/2017 at Unknown time  . omeprazole (PRILOSEC) 40 MG capsule Take 40 mg by mouth 2 (two) times daily.    12/13/2017 at Unknown time  . oxyCODONE-acetaminophen (PERCOCET/ROXICET) 5-325 MG tablet Take 1-2 tablets by mouth every 6 (six) hours as needed for severe pain.    Past Week at Unknown time  . pravastatin (PRAVACHOL) 40 MG tablet TAKE 1 TABLET BY MOUTH ONCE DAILY (Patient taking differently: TAKE 40 MG BY MOUTH ONCE DAILY) 90 tablet 0 12/12/2017 at Unknown time  . probenecid (BENEMID) 500 MG tablet Take 500 mg by mouth 2 (two) times daily.   0 12/12/2017 at Unknown time  . vitamin C (ASCORBIC ACID) 500 MG tablet Take 500 mg by mouth daily.    12/12/2017 at Unknown time  . Vitamin E 400 units TABS Take 400 Units by mouth 2 (two) times daily.   12/12/2017 at Unknown time  . warfarin (COUMADIN) 3 MG tablet 1 tablet  Monday, Wednesday, Friday. (Patient taking differently: Take 3 mg by mouth See admin instructions. Take 3 mg by mouth daily on Sundays and Mondays) 90 tablet 3 12/08/2017  . warfarin (COUMADIN) 4 MG tablet 1 tablet Tuesday, Thursday, Saturday, and Sunday. (Patient taking differently: Take 4 mg by mouth See admin instructions. Take 4 mg by mouth  daily on Tuesday, Wednesday, Thursday, Friday and Saturday) 90 tablet 1 12/08/2017  . nitroGLYCERIN (NITROSTAT) 0.4 MG SL tablet Place 1 tablet (0.4 mg total) under the tongue every 5 (five) minutes x 3 doses as needed. For chest pain.  Call 911 if no relief after 3 tablets 30 tablet 11 Taking   Scheduled: . amLODipine  10 mg Oral Daily  . amoxicillin-clavulanate  1 tablet Oral Q12H  . azelastine  2 spray Each Nare Daily  . carvedilol  25 mg Oral BID WC  . clopidogrel  75 mg Oral Daily  . colchicine  0.6 mg Oral Daily  . dicyclomine  10 mg Oral TID AC  . docusate sodium  100 mg Oral BID  . enoxaparin (LOVENOX) injection  40 mg Subcutaneous Q24H  . gabapentin  600 mg Oral TID  . hydrALAZINE  100 mg Oral TID  . insulin aspart  0-15 Units Subcutaneous TID WC  . insulin aspart  6 Units Subcutaneous TID WC  . insulin detemir  90 Units Subcutaneous Q2200  . isosorbide mononitrate  120 mg Oral Daily  . mesalamine  2.4 g Oral Q breakfast  . metFORMIN  1,000 mg Oral BID WC  . multivitamin with minerals  1 tablet Oral Daily  . pantoprazole  80 mg Oral Daily  . pravastatin  40 mg Oral Daily  . probenecid  500 mg Oral BID  . vitamin C  500 mg Oral Daily  . [START ON 12/17/2017] Vitamin D (Ergocalciferol)  50,000 Units Oral Q Sat  . vitamin E  400 Units Oral BID  . warfarin  4 mg Oral q1800  . Warfarin - Pharmacist Dosing Inpatient   Does not apply q1800    ROS: History obtained from the patient  General ROS: fever Psychological  ROS: confiusion Ophthalmic ROS: negative for - blurry vision, double vision, eye pain or loss of vision ENT ROS: negative for - epistaxis, nasal discharge, oral lesions, sore throat, tinnitus or vertigo Allergy and Immunology ROS: negative for - hives or itchy/watery eyes Hematological and Lymphatic ROS: negative for - bleeding problems, bruising or swollen lymph nodes Endocrine ROS: negative for - galactorrhea, hair pattern changes, polydipsia/polyuria or  temperature intolerance Respiratory ROS: negative for - cough, hemoptysis, shortness of breath or wheezing Cardiovascular ROS: negative for - chest pain, dyspnea on exertion, edema or irregular heartbeat Gastrointestinal ROS: negative for - abdominal pain, diarrhea, hematemesis, nausea/vomiting or stool incontinence Genito-Urinary ROS: negative for - dysuria, hematuria, incontinence or urinary frequency/urgency Musculoskeletal ROS: right hip pain Neurological ROS: as noted in HPI Dermatological ROS: negative for rash and skin lesion changes  Physical Examination: Blood pressure (!) 128/51, pulse 68, temperature 99 F (37.2 C), temperature source Oral, resp. rate 18, height 5' 8"  (1.727 m), weight 94.3 kg (207 lb 14.3 oz), SpO2 93 %.  HEENT-  Normocephalic, no lesions, without obvious abnormality.  Normal external eye and conjunctiva.  Normal TM's bilaterally.  Normal auditory canals and external ears. Normal external nose, mucus membranes and septum.  Normal pharynx. Cardiovascular- S1, S2 normal, pulses palpable throughout   Lungs- chest clear, no wheezing, rales, normal symmetric air entry Abdomen- soft, non-tender; bowel sounds normal; no masses,  no organomegaly Extremities- RLE swelling Lymph-no adenopathy palpable Musculoskeletal-right hip tenderness Skin-warm and dry, no hyperpigmentation, vitiligo, or suspicious lesions  Neurological Examination   Mental Status: Alert, oriented, thought content appropriate.  Speech fluent without evidence of aphasia.  Able to follow 3 step commands without difficulty. Cranial Nerves: II: Discs flat bilaterally; Visual fields grossly normal, pupils equal, round, reactive to light and accommodation III,IV, VI: ptosis not present, extra-ocular motions intact bilaterally V,VII: smile symmetric, facial light touch sensation normal bilaterally VIII: hearing normal bilaterally IX,X: gag reflex present XI: bilateral shoulder shrug XII: midline tongue  extension Motor: 5/5 in the BUE's and LLE.  RLE not tested due to recent surgery.   Sensory: Pinprick and light touch intact throughout, bilaterally Deep Tendon Reflexes: 2+ in the upper extremities and absent in the lower extremities Plantars: Right: mute   Left: mute Cerebellar: Normal finger-to-nose testing bilaterally Gait: not tested due to safety concerns    Laboratory Studies:  Basic Metabolic Panel: Recent Labs  Lab 12/14/17 1108 12/14/17 1832 12/16/17 0440  NA 133* 134* 132*  K 3.6 4.0 3.5  CL 100* 100* 96*  CO2 24 24 28   GLUCOSE 279* 224* 208*  BUN 14 13 15   CREATININE 1.06 0.93 0.96  CALCIUM 8.3* 8.7* 8.0*    Liver Function Tests: No results for input(s): AST, ALT, ALKPHOS, BILITOT, PROT, ALBUMIN in the last 168 hours. No results for input(s): LIPASE, AMYLASE in the last 168 hours. No results for input(s): AMMONIA in the last 168 hours.  CBC: Recent Labs  Lab 12/14/17 1016 12/14/17 1832 12/16/17 0440  WBC 8.8 8.6 10.4  NEUTROABS  --   --  7.6*  HGB 11.5* 11.7* 9.6*  HCT 33.8* 34.2* 27.5*  MCV 101.1* 99.1 98.5  PLT 222 204 190    Cardiac Enzymes: No results for input(s): CKTOTAL, CKMB, CKMBINDEX, TROPONINI in the last 168 hours.  BNP: Invalid input(s): POCBNP  CBG: Recent Labs  Lab 12/15/17 1141 12/15/17 1743 12/15/17 2138 12/16/17 0745 12/16/17 1220  GLUCAP 232* 237* 247* 210* 225*    Microbiology: Results for orders placed  or performed during the hospital encounter of 12/13/17  CULTURE, BLOOD (ROUTINE X 2) w Reflex to ID Panel     Status: None (Preliminary result)   Collection Time: 12/14/17  6:08 PM  Result Value Ref Range Status   Specimen Description BLOOD BLOOD RIGHT ARM  Final   Special Requests   Final    BOTTLES DRAWN AEROBIC AND ANAEROBIC Blood Culture adequate volume   Culture   Final    NO GROWTH 2 DAYS Performed at [facility], [address] [place], [place] [address]    Report Status PENDING  Incomplete   Urine Culture     Status: None   Collection Time: 12/14/17 10:12 PM  Result Value Ref Range Status   Specimen Description   Final    URINE, RANDOM Performed at [facility], [address], [place], [place] [address]    Special Requests   Final    NONE Performed at [facility], [address] [place], [place] [address]    Culture   Final    NO GROWTH Performed at [facility], [place] [address] [place], [place] [address]    Report Status 12/16/2017 FINAL  Final  CULTURE, BLOOD (ROUTINE X 2) w Reflex to ID Panel     Status: None (Preliminary result)   Collection Time: 12/15/17 12:24 AM  Result Value Ref Range Status   Specimen Description BLOOD RIGHT HAND  Final   Special Requests   Final    BOTTLES DRAWN AEROBIC AND ANAEROBIC Blood Culture results may not be optimal due to an excessive volume of blood received in culture bottles   Culture   Final    NO GROWTH 1 DAY Performed at [facility], [address] [place], [place] [address]    Report Status PENDING  Incomplete    Coagulation Studies: Recent Labs    12/14/17 0448 12/15/17 0728 12/16/17 0440  LABPROT 14.6 13.6 15.3*  INR 1.15 1.05 1.22    Urinalysis:  Recent Labs  Lab 12/14/17 2212  COLORURINE STRAW*  LABSPEC 1.006  PHURINE 5.0  GLUCOSEU NEGATIVE  HGBUR NEGATIVE  BILIRUBINUR NEGATIVE  KETONESUR NEGATIVE  PROTEINUR NEGATIVE  NITRITE NEGATIVE  LEUKOCYTESUR NEGATIVE    Lipid Panel:    Component Value Date/Time   CHOL 124 03/24/2016 1450   TRIG 389.0 (H) 03/24/2016 1450   HDL 25.60 (L) 03/24/2016 1450   CHOLHDL 5 03/24/2016 1450   VLDL 77.8 (H) 03/24/2016 1450    HgbA1C:  Lab Results  Component Value Date   HGBA1C 6.5 (H) 10/03/2017    Urine Drug Screen:  No results found for: LABOPIA, COCAINSCRNUR, LABBENZ, AMPHETMU, THCU, LABBARB  Alcohol Level: No results for input(s): ETH in the last 168 hours.  Other results: EKG: normal sinus rhythm at 74  bpm.  Imaging: Dg Chest 1 View  Result Date: 12/14/2017 CLINICAL DATA:  Code stroke.  Febrile. EXAM: CHEST  1 VIEW COMPARISON:  Chest radiograph October 12, 2017, PET CT December 08, 2017 FINDINGS: The cardiac silhouette is moderately enlarged, increased from prior examination. Pulmonary vascular congestion interstitial prominence increased from prior examination. Multiple pulmonary nodules/granulomas. Status post median sternotomy for CABG, coronary artery stent. No pneumothorax. Osteopenia. Soft tissue planes are unchanged. IMPRESSION: Worsening cardiomegaly with pulmonary vascular congestion/interstitial prominence compatible with pulmonary edema. Multiple pulmonary nodules, please see PET CT November 30, 2017 for dedicated findings. Electronically Signed   By: Elon Alas M.D.   On: 12/14/2017 18:41   Ct  Head Wo Contrast  Result Date: 12/14/2017 CLINICAL DATA:  72 y/o M; increased blood pressure and decreased level of consciousness. Inability to swallow. Tremor in arms. EXAM: CT HEAD WITHOUT CONTRAST TECHNIQUE: Contiguous axial images were obtained from the base of the skull through the vertex without intravenous contrast. COMPARISON:  10/11/2014 MRI of the head. FINDINGS: Brain: No evidence of acute infarction, hemorrhage, hydrocephalus, extra-axial collection or mass lesion/mass effect. Stable mild chronic microvascular ischemic changes and parenchymal volume loss of the brain. Vascular: Calcific atherosclerosis of the carotid siphons. No hyperdense vessel. Skull: Normal. Negative for fracture or focal lesion. Sinuses/Orbits: Mild diffuse paranasal sinus mucosal thickening and partial opacification of left anterior ethmoid air cells. Normal aeration of mastoid air cells. Bilateral intra-ocular lens replacement. Other: None. IMPRESSION: 1. No acute intracranial abnormality identified. 2. Stable mild chronic microvascular ischemic changes and parenchymal volume loss of the brain. 3. Mild paranasal sinus  disease. Electronically Signed   By: Kristine Garbe M.D.   On: 12/14/2017 18:06   Mr Jeri Cos WU Contrast  Result Date: 12/15/2017 CLINICAL DATA:  Follow-up stroke. History of calcified mesenteric lesion, atrial fibrillation, hypertension, hyperlipidemia. EXAM: MRI HEAD WITHOUT AND WITH CONTRAST TECHNIQUE: Multiplanar, multiecho pulse sequences of the brain and surrounding structures were obtained without and with intravenous contrast. CONTRAST:  57m MULTIHANCE GADOBENATE DIMEGLUMINE 529 MG/ML IV SOLN COMPARISON:  CT HEAD December 14, 2017 and MRI of the head October 11, 2014 FINDINGS: INTRACRANIAL CONTENTS: No reduced diffusion to suggest acute ischemia or hypercellular tumor. No susceptibility artifact to suggest hemorrhage. Moderate parenchymal brain volume loss. No hydrocephalus. Patchy to confluent supratentorial white matter FLAIR T2 hyperintensities increased from prior MRI. No suspicious parenchymal signal, masses, mass effect. RIGHT temporal lobe developmental venous anomaly suggested on coronal post gadolinium image 19, however not present on post gadolinium axial T1 or prior MRI, most compatible with artifact. No abnormal intraparenchymal or extra-axial enhancement. No abnormal extra-axial fluid collections. No extra-axial masses. VASCULAR: Normal major intracranial vascular flow voids present at skull base. SKULL AND UPPER CERVICAL SPINE: No abnormal sellar expansion. No suspicious calvarial bone marrow signal. Craniocervical junction maintained. SINUSES/ORBITS: Mild paranasal sinus mucosal thickening without air-fluid levels. Small bilateral mastoid effusions.The included ocular globes and orbital contents are non-suspicious. Status post bilateral ocular lens implants. OTHER: None. IMPRESSION: 1. No acute intracranial process. 2. Moderate parenchymal brain volume loss, progressed from 2016. 3. Mild-to-moderate chronic small vessel ischemic disease, progressed from 2016. Electronically Signed    By: CElon AlasM.D.   On: 12/15/2017 17:10   UKoreaCarotid Bilateral  Result Date: 12/15/2017 CLINICAL DATA:  Stroke. History of CAD (post CABG), hypertension and diabetes. EXAM: BILATERAL CAROTID DUPLEX ULTRASOUND TECHNIQUE: GPearline Cablesscale imaging, color Doppler and duplex ultrasound were performed of bilateral carotid and vertebral arteries in the neck. COMPARISON:  None. FINDINGS: Criteria: Quantification of carotid stenosis is based on velocity parameters that correlate the residual internal carotid diameter with NASCET-based stenosis levels, using the diameter of the distal internal carotid lumen as the denominator for stenosis measurement. The following velocity measurements were obtained: RIGHT ICA:  100/21 cm/sec CCA:  1981/19cm/sec SYSTOLIC ICA/CCA RATIO:  0.7 ECA:  180 cm/sec LEFT ICA:  126/36 cm/sec CCA:  1147/82cm/sec SYSTOLIC ICA/CCA RATIO:  0.9 ECA:  132 cm/sec RIGHT CAROTID ARTERY: There is a minimal amount of mixed echogenic plaque involving the proximal aspects of the right internal carotid artery (image 22), not resulting in elevated peak systolic velocities within the interrogated course the right internal carotid  artery to suggest a hemodynamically significant stenosis. Borderline elevated peak systolic velocity within distal aspect the right internal carotid artery is felt to be factitiously elevated due to sampling at a location of turbulent flow. RIGHT VERTEBRAL ARTERY:  Antegrade Flow LEFT CAROTID ARTERY: There is a minimal amount of atherosclerotic plaque with the left carotid bulb (image 47). There is a minimal to moderate amount of mixed echogenic partially shadowing plaque involving the origin and proximal aspects of the left internal carotid artery (image 54), which results in borderline elevated peak systolic velocities within the proximal and mid aspects of the left internal carotid artery. Borderline elevated peak systolic velocity with the proximal ICA measures 120 cm/sec - image  56. LEFT VERTEBRAL ARTERY:  Antegrade flow IMPRESSION: 1. Minimal to moderate amount of left-sided atherosclerotic plaque results in borderline elevated peak systolic velocities within the left internal carotid artery which approach the 50% luminal narrowing range. Further evaluation with CTA could performed as clinically indicated. 2. Minimal amount of right-sided atherosclerotic plaque, not resulting in a hemodynamically significant stenosis. Electronically Signed   By: Sandi Mariscal M.D.   On: 12/15/2017 17:35   Dg Chest Port 1 View  Result Date: 12/16/2017 CLINICAL DATA:  Difficulty breathing EXAM: PORTABLE CHEST 1 VIEW COMPARISON:  December 15, 2017 FINDINGS: There is patchy atelectasis in the lung bases. There is no frank edema or consolidation. There is cardiomegaly with pulmonary vascularity within normal limits. No adenopathy. Patient is status post median sternotomy. There is postoperative change in the right shoulder. No adenopathy. There is aortic atherosclerosis. IMPRESSION: Patchy bibasilar atelectasis. No edema or consolidation. Stable cardiomegaly. Aortic atherosclerosis. Aortic Atherosclerosis (ICD10-I70.0). Electronically Signed   By: Lowella Grip III M.D.   On: 12/16/2017 07:34   Dg Chest Port 1 View  Result Date: 12/15/2017 CLINICAL DATA:  Acute respiratory failure EXAM: PORTABLE CHEST 1 VIEW COMPARISON:  12/14/2017 FINDINGS: Postoperative changes in the mediastinum. Shallow inspiration. Cardiac enlargement with pulmonary vascular congestion appears improving since previous study. Linear atelectasis in the left lung base. No definite edema or effusion. No pneumothorax. Calcification of the aorta. Postoperative changes in the right shoulder. IMPRESSION: Cardiac enlargement with improving pulmonary vascularity and congestive changes since previous study. Atelectasis in the left lung base. Aortic atherosclerosis. Electronically Signed   By: Lucienne Capers M.D.   On: 12/15/2017 05:12     Assessment: 72 y.o. male with a history of atrial fibrillation on Coumadin s/p recent surgery when Coumadin was discontinued.   Patient with recent episode of confusion as well. Unclear if this was related to TIA versus encephalopathy related to fever.  Patient on antibiotics for sepsis.  MRI of the brain reviewed and shows no acute changes.  Coumadin has now been reinitiated.  Carotid dopplers show LICA with near 33% stenosis and RICA with no evidence of hemodynamically significant stenosis.  Echocardiogram shows no cardiac source of emboli with an EF of 50-55%.  Stroke Risk Factors - atrial fibrillation, diabetes mellitus, hyperlipidemia and hypertension  Plan: 1. HgbA1c, fasting lipid panel 2. Continued therapy 3. Agree with continued Coumadin 4. Frequent neuro checks  No further neurologic intervention is recommended at this time.  If further questions arise, please call or page at that time.  Thank you for allowing neurology to participate in the care of this patient.   Alexis Goodell, MD Neurology [phone] 12/16/2017, 2:17 PM

## 2017-12-16 NOTE — Discharge Instructions (Signed)

## 2017-12-17 DIAGNOSIS — Z7401 Bed confinement status: Secondary | ICD-10-CM | POA: Diagnosis not present

## 2017-12-17 DIAGNOSIS — E569 Vitamin deficiency, unspecified: Secondary | ICD-10-CM | POA: Diagnosis not present

## 2017-12-17 DIAGNOSIS — A419 Sepsis, unspecified organism: Secondary | ICD-10-CM | POA: Diagnosis not present

## 2017-12-17 DIAGNOSIS — R2689 Other abnormalities of gait and mobility: Secondary | ICD-10-CM | POA: Diagnosis not present

## 2017-12-17 DIAGNOSIS — E119 Type 2 diabetes mellitus without complications: Secondary | ICD-10-CM | POA: Diagnosis not present

## 2017-12-17 DIAGNOSIS — I4891 Unspecified atrial fibrillation: Secondary | ICD-10-CM | POA: Diagnosis not present

## 2017-12-17 DIAGNOSIS — I10 Essential (primary) hypertension: Secondary | ICD-10-CM | POA: Diagnosis not present

## 2017-12-17 DIAGNOSIS — R531 Weakness: Secondary | ICD-10-CM | POA: Diagnosis not present

## 2017-12-17 DIAGNOSIS — R4182 Altered mental status, unspecified: Secondary | ICD-10-CM | POA: Diagnosis not present

## 2017-12-17 DIAGNOSIS — M25551 Pain in right hip: Secondary | ICD-10-CM | POA: Diagnosis not present

## 2017-12-17 DIAGNOSIS — E785 Hyperlipidemia, unspecified: Secondary | ICD-10-CM | POA: Diagnosis not present

## 2017-12-17 DIAGNOSIS — S71001D Unspecified open wound, right hip, subsequent encounter: Secondary | ICD-10-CM | POA: Diagnosis not present

## 2017-12-17 DIAGNOSIS — I2510 Atherosclerotic heart disease of native coronary artery without angina pectoris: Secondary | ICD-10-CM | POA: Diagnosis not present

## 2017-12-17 DIAGNOSIS — M6281 Muscle weakness (generalized): Secondary | ICD-10-CM | POA: Diagnosis not present

## 2017-12-17 DIAGNOSIS — Z5189 Encounter for other specified aftercare: Secondary | ICD-10-CM | POA: Diagnosis not present

## 2017-12-17 LAB — PROTIME-INR
INR: 1.25
PROTHROMBIN TIME: 15.6 s — AB (ref 11.4–15.2)

## 2017-12-17 LAB — GLUCOSE, CAPILLARY
Glucose-Capillary: 186 mg/dL — ABNORMAL HIGH (ref 65–99)
Glucose-Capillary: 232 mg/dL — ABNORMAL HIGH (ref 65–99)

## 2017-12-17 MED ORDER — AMOXICILLIN-POT CLAVULANATE 875-125 MG PO TABS: 1.0000 | ORAL_TABLET | Freq: Two times a day (BID) | ORAL | 0 refills | Status: DC

## 2017-12-17 MED ORDER — AMOXICILLIN-POT CLAVULANATE 875-125 MG PO TABS: 1.0000 | ORAL_TABLET | Freq: Two times a day (BID) | ORAL | 0 refills | Status: AC

## 2017-12-17 MED ORDER — ENOXAPARIN SODIUM 40 MG/0.4ML ~~LOC~~ SOLN: 40.0000 mg | SUBCUTANEOUS | 0 refills | Status: DC

## 2017-12-17 NOTE — Progress Notes (Signed)
Physical Therapy Treatment Patient Details Name: Chase Cabrera MRN: [id number] DOB: 12-29-1945 Today's Date: 12/17/2017    History of Present Illness 72 y/o male s/p R hip THR (anterior approprach) 4/3.  Pt had a bout of lethargy and decreased responsiveness - transfered to CCU, is now back on the ortho floor.    PT Comments    Participated in exercises as described below.  Pt reluctant to get out of bed and delayed getting up this am.  Premedicated before session but remained hesitant due to it being difficult yesterday.  He was however able to transition to edge of bed with mod assist.  Once sitting, he was able to sit with supervision.  He stood with min/mod a x 2 and was able to transfer to recliner at bedside with min a x 2.  No buckling noted but remained limited with mobility due to fatigue.  Remained up in recliner at end of session.  Anticipate discharge to SNF today.   Follow Up Recommendations  SNF     Equipment Recommendations  None recommended by PT    Recommendations for Other Services       Precautions / Restrictions Precautions Precautions: Fall;Anterior Hip Precaution Booklet Issued: No Restrictions Weight Bearing Restrictions: Yes RLE Weight Bearing: Weight bearing as tolerated    Mobility  Bed Mobility Overal bed mobility: Needs Assistance Bed Mobility: Supine to Sit     Supine to sit: Mod assist        Transfers Overall transfer level: Needs assistance Equipment used: Rolling walker (2 wheeled) Transfers: Sit to/from Stand Sit to Stand: Min assist;Mod assist;+2 physical assistance            Ambulation/Gait Ambulation/Gait assistance: Min assist;+2 physical assistance Ambulation Distance (Feet): 3 Feet Assistive device: Rolling walker (2 wheeled) Gait Pattern/deviations: Step-through pattern;Decreased step length - right;Decreased step length - left   Gait velocity interpretation: <1.8 ft/sec, indicative of risk for recurrent  falls General Gait Details: Overall significantly improved mobility today but remained limited gait due to fatigue.   Stairs            Wheelchair Mobility    Modified Rankin (Stroke Patients Only)       Balance Overall balance assessment: Needs assistance Sitting-balance support: Feet supported;Bilateral upper extremity supported Sitting balance-Leahy Scale: Good     Standing balance support: Bilateral upper extremity supported Standing balance-Leahy Scale: Poor Standing balance comment: highly reliant on the walker                            Cognition Arousal/Alertness: Awake/alert Behavior During Therapy: WFL for tasks assessed/performed;Agitated Overall Cognitive Status: Within Functional Limits for tasks assessed                                        Exercises Total Joint Exercises Ankle Circles/Pumps: Strengthening;10 reps Quad Sets: Strengthening;10 reps Gluteal Sets: Strengthening;10 reps Short Arc Quad: Strengthening;AROM;10 reps Heel Slides: AAROM;10 reps Hip ABduction/ADduction: 10 reps;AROM    General Comments        Pertinent Vitals/Pain Pain Assessment: 0-10 Pain Score: 5  Pain Location: reports minimal pain at rest, is sensitive to most movement of R LE Pain Descriptors / Indicators: Operative site guarding Pain Intervention(s): Limited activity within patient's tolerance;Premedicated before session    Home Living  Prior Function            PT Goals (current goals can now be found in the care plan section) Progress towards PT goals: Progressing toward goals    Frequency    BID      PT Plan Current plan remains appropriate    Co-evaluation              AM-PAC PT "6 Clicks" Daily Activity  Outcome Measure  Difficulty turning over in bed (including adjusting bedclothes, sheets and blankets)?: Unable Difficulty moving from lying on back to sitting on the side of  the bed? : Unable Difficulty sitting down on and standing up from a chair with arms (e.g., wheelchair, bedside commode, etc,.)?: Unable Help needed moving to and from a bed to chair (including a wheelchair)?: A Lot Help needed walking in hospital room?: A Lot Help needed climbing 3-5 steps with a railing? : Total 6 Click Score: 8    End of Session Equipment Utilized During Treatment: Gait belt Activity Tolerance: Patient tolerated treatment well Patient left: in chair;with call bell/phone within reach;with family/visitor present;with chair alarm set Nurse Communication: Mobility status Pain - Right/Left: Right Pain - part of body: Hip     Time: 1000-1017 PT Time Calculation (min) (ACUTE ONLY): 17 min  Charges:  $Therapeutic Exercise: 8-22 mins                    G Codes:      Chesley Noon, PTA 12/17/17, 10:40 AM

## 2017-12-17 NOTE — Consult Note (Signed)
ANTICOAGULATION CONSULT NOTE -follow-up Consult  Pharmacy Consult for Warfarin Dosing  Indication: atrial fibrillation and VTE prophylaxis  Allergies  Allergen Reactions  . Allopurinol Diarrhea, Other (See Comments) and Nausea And Vomiting    Other Reaction: GI Upset  . Atenolol Other (See Comments)    Other Reaction: bradycardia  . Atorvastatin Other (See Comments)    Other Reaction: muscle aches  . Ramipril Rash  . Rosuvastatin Other (See Comments)    Muscle aches  . Simvastatin Other (See Comments)    Other Reaction: MUSCLE ACHES (Zocor)  . Valsartan Other (See Comments)    Other Reaction: facial swelling  . Cardizem [Diltiazem] Rash   Patient Measurements: Height: 5' 8"  (172.7 cm) Weight: 207 lb 14.3 oz (94.3 kg) IBW/kg (Calculated) : 68.4  Vital Signs: Temp: 99.2 F (37.3 C) (04/06 0734) Temp Source: Oral (04/06 0734) BP: 134/71 (04/06 0734) Pulse Rate: 65 (04/06 0734)  Labs: Recent Labs    12/14/17 1016 12/14/17 1108 12/14/17 1832 12/15/17 0728 12/16/17 0440 12/17/17 0324  HGB 11.5*  --  11.7*  --  9.6*  --   HCT 33.8*  --  34.2*  --  27.5*  --   PLT 222  --  204  --  190  --   APTT  --   --  30  --   --   --   LABPROT  --   --   --  13.6 15.3* 15.6*  INR  --   --   --  1.05 1.22 1.25  CREATININE  --  1.06 0.93  --  0.96  --    Estimated Creatinine Clearance: 76.4 mL/min (by C-G formula based on SCr of 0.96 mg/dL).  Medical History: Past Medical History:  Diagnosis Date  . Arthritis   . Atrial fibrillation ([place])   . CAD (coronary artery disease)    6 STENTS  . CHF (congestive heart failure) ([place])   . Chicken pox   . Colon polyp   . Corneal dystrophy    bilateral  . Crohn's disease ([place])   . Diabetes ([place]) 2002  . Dysrhythmia    A-FIB AND PAF  . GERD (gastroesophageal reflux disease)   . Gout   . History of kidney stones   . History of shingles   . Hyperlipemia   . Hypertension   . Kidney stones   . Myocardial infarction ([place])    x4,  last one in 2010  . Peripheral neuropathy   . Peripheral neuropathy   . Peripheral neuropathy   . PUD (peptic ulcer disease)   . Renal artery stenosis ([place])   . Renal artery stenosis ([place])   . Salzmann's nodular dystrophy 2012  . Sleep apnea   . Spinal stenosis     Assessment: Pharmacy consulted for Warfarin dosing and monitoring for VTE prophylaxis in 72 yo male with PMH of A. Fib. Patient admitted S/P right total hip arthoplasty.   Home Regimen: Warfarin 9m: T,W, Th, F, Sa                             Warfarin 365m Su, M  DATE INR DOSE 4/2 1.12       Not given?  4/3       1.15       Not given? 4/4 1.05       22m422m4/5       1.22    4 mg 4/6  1.25    Goal of Therapy:  INR 2-3 Monitor platelets by anticoagulation protocol: Yes   Plan:  Will continue with warfarin 69m daily and monitor closely. Will switch to home regimen if patient is becoming supratherapeutic on warfarin 435mpo daily.  Patient on Lovenox 4036m24h. Patient also on Augmentin and mesalamine   INR daily per protocol Pharmacy will continue to follow and adjust per consult.   KriChinita GreenlandarmD Clinical Pharmacist 12/17/2017

## 2017-12-17 NOTE — Progress Notes (Signed)
Patient is medically stable for discharge today to [facility]. Broadus John, [facility] liaison, stated that patient can come to Room 805. CSW sent discharge summary via [place]. Patient and his wife [name] are aware of above. RN to call report and arrange for EMS to transport. Please reconsult if future social work needs arise. CSW and Social work Architectural technologist off.  Susy Frizzle, Social Work Intern [phone]

## 2017-12-17 NOTE — Progress Notes (Signed)
[facility] [place] NAME: Chase Cabrera    MR#:  [id number]  DATE OF BIRTH:  11/11/1945  SUBJECTIVE:   No acute events overnight.  Shortness of breath is improved.  Patient's wife is at bedside.  Patient is awaiting discharge to rehab facility today.  REVIEW OF SYSTEMS:    Review of Systems  Constitutional: Negative for chills and fever.  HENT: Negative for congestion and tinnitus.   Eyes: Negative for blurred vision and double vision.  Respiratory: Negative for cough, shortness of breath and wheezing.   Cardiovascular: Negative for chest pain, orthopnea and PND.  Gastrointestinal: Negative for abdominal pain, diarrhea, nausea and vomiting.  Genitourinary: Negative for dysuria and hematuria.  Neurological: Negative for dizziness, sensory change and focal weakness.  All other systems reviewed and are negative.   Nutrition: Heart healthy/Carb control Tolerating Diet: yes Tolerating PT: Eval noted.   DRUG ALLERGIES:   Allergies  Allergen Reactions  . Allopurinol Diarrhea, Other (See Comments) and Nausea And Vomiting    Other Reaction: GI Upset  . Atenolol Other (See Comments)    Other Reaction: bradycardia  . Atorvastatin Other (See Comments)    Other Reaction: muscle aches  . Ramipril Rash  . Rosuvastatin Other (See Comments)    Muscle aches  . Simvastatin Other (See Comments)    Other Reaction: MUSCLE ACHES (Zocor)  . Valsartan Other (See Comments)    Other Reaction: facial swelling  . Cardizem [Diltiazem] Rash    VITALS:  Blood pressure 134/71, pulse 65, temperature 99.2 F (37.3 C), temperature source Oral, resp. rate 18, height 5' 8"  (1.727 m), weight 94.3 kg (207 lb 14.3 oz), SpO2 95 %.  PHYSICAL EXAMINATION:   Physical Exam  GENERAL:  72 y.o.-year-old patient lying in bed in no acute distress.  EYES: Pupils equal, round, reactive to light and accommodation. No scleral icterus. Extraocular muscles intact.   HEENT: Head atraumatic, normocephalic. Oropharynx and nasopharynx clear.  NECK:  Supple, no jugular venous distention. No thyroid enlargement, no tenderness.  LUNGS: Normal breath sounds bilaterally, no wheezing, rales, rhonchi. No use of accessory muscles of respiration.  CARDIOVASCULAR: S1, S2 normal. No murmurs, rubs, or gallops.  ABDOMEN: Soft, nontender, nondistended. Bowel sounds present. No organomegaly or mass.  EXTREMITIES: No cyanosis, clubbing or edema b/l.    NEUROLOGIC: Cranial nerves II through XII are intact. No focal Motor or sensory deficits b/l. Globally weak  PSYCHIATRIC: The patient is alert and oriented x 3.  SKIN: No obvious rash, lesion, or ulcer.    LABORATORY PANEL:   CBC Recent Labs  Lab 12/16/17 0440  WBC 10.4  HGB 9.6*  HCT 27.5*  PLT 190   ------------------------------------------------------------------------------------------------------------------  Chemistries  Recent Labs  Lab 12/16/17 0440  NA 132*  K 3.5  CL 96*  CO2 28  GLUCOSE 208*  BUN 15  CREATININE 0.96  CALCIUM 8.0*   ------------------------------------------------------------------------------------------------------------------  Cardiac Enzymes No results for input(s): TROPONINI in the last 168 hours. ------------------------------------------------------------------------------------------------------------------  RADIOLOGY:  Mr Jeri Cos Wo Contrast  Result Date: 12/15/2017 CLINICAL DATA:  Follow-up stroke. History of calcified mesenteric lesion, atrial fibrillation, hypertension, hyperlipidemia. EXAM: MRI HEAD WITHOUT AND WITH CONTRAST TECHNIQUE: Multiplanar, multiecho pulse sequences of the brain and surrounding structures were obtained without and with intravenous contrast. CONTRAST:  78m MULTIHANCE GADOBENATE DIMEGLUMINE 529 MG/ML IV SOLN COMPARISON:  CT HEAD December 14, 2017 and MRI of the head October 11, 2014 FINDINGS: INTRACRANIAL CONTENTS: No reduced diffusion to  suggest acute ischemia or hypercellular tumor. No susceptibility artifact to suggest hemorrhage. Moderate parenchymal brain volume loss. No hydrocephalus. Patchy to confluent supratentorial white matter FLAIR T2 hyperintensities increased from prior MRI. No suspicious parenchymal signal, masses, mass effect. RIGHT temporal lobe developmental venous anomaly suggested on coronal post gadolinium image 19, however not present on post gadolinium axial T1 or prior MRI, most compatible with artifact. No abnormal intraparenchymal or extra-axial enhancement. No abnormal extra-axial fluid collections. No extra-axial masses. VASCULAR: Normal major intracranial vascular flow voids present at skull base. SKULL AND UPPER CERVICAL SPINE: No abnormal sellar expansion. No suspicious calvarial bone marrow signal. Craniocervical junction maintained. SINUSES/ORBITS: Mild paranasal sinus mucosal thickening without air-fluid levels. Small bilateral mastoid effusions.The included ocular globes and orbital contents are non-suspicious. Status post bilateral ocular lens implants. OTHER: None. IMPRESSION: 1. No acute intracranial process. 2. Moderate parenchymal brain volume loss, progressed from 2016. 3. Mild-to-moderate chronic small vessel ischemic disease, progressed from 2016. Electronically Signed   By: Elon Alas M.D.   On: 12/15/2017 17:10   US Carotid Bilateral  Result Date: 12/15/2017 CLINICAL DATA:  Stroke. History of CAD (post CABG), hypertension and diabetes. EXAM: BILATERAL CAROTID DUPLEX ULTRASOUND TECHNIQUE: Pearline Cables scale imaging, color Doppler and duplex ultrasound were performed of bilateral carotid and vertebral arteries in the neck. COMPARISON:  None. FINDINGS: Criteria: Quantification of carotid stenosis is based on velocity parameters that correlate the residual internal carotid diameter with NASCET-based stenosis levels, using the diameter of the distal internal carotid lumen as the denominator for stenosis  measurement. The following velocity measurements were obtained: RIGHT ICA:  100/21 cm/sec CCA:  026/37 cm/sec SYSTOLIC ICA/CCA RATIO:  0.7 ECA:  180 cm/sec LEFT ICA:  126/36 cm/sec CCA:  858/85 cm/sec SYSTOLIC ICA/CCA RATIO:  0.9 ECA:  132 cm/sec RIGHT CAROTID ARTERY: There is a minimal amount of mixed echogenic plaque involving the proximal aspects of the right internal carotid artery (image 22), not resulting in elevated peak systolic velocities within the interrogated course the right internal carotid artery to suggest a hemodynamically significant stenosis. Borderline elevated peak systolic velocity within distal aspect the right internal carotid artery is felt to be factitiously elevated due to sampling at a location of turbulent flow. RIGHT VERTEBRAL ARTERY:  Antegrade Flow LEFT CAROTID ARTERY: There is a minimal amount of atherosclerotic plaque with the left carotid bulb (image 47). There is a minimal to moderate amount of mixed echogenic partially shadowing plaque involving the origin and proximal aspects of the left internal carotid artery (image 54), which results in borderline elevated peak systolic velocities within the proximal and mid aspects of the left internal carotid artery. Borderline elevated peak systolic velocity with the proximal ICA measures 120 cm/sec - image 56. LEFT VERTEBRAL ARTERY:  Antegrade flow IMPRESSION: 1. Minimal to moderate amount of left-sided atherosclerotic plaque results in borderline elevated peak systolic velocities within the left internal carotid artery which approach the 50% luminal narrowing range. Further evaluation with CTA could performed as clinically indicated. 2. Minimal amount of right-sided atherosclerotic plaque, not resulting in a hemodynamically significant stenosis. Electronically Signed   By: Sandi Mariscal M.D.   On: 12/15/2017 17:35   Dg Chest Port 1 View  Result Date: 12/16/2017 CLINICAL DATA:  Difficulty breathing EXAM: PORTABLE CHEST 1 VIEW COMPARISON:   December 15, 2017 FINDINGS: There is patchy atelectasis in the lung bases. There is no frank edema or consolidation. There is cardiomegaly with pulmonary vascularity within normal limits. No adenopathy. Patient is status post median sternotomy.  There is postoperative change in the right shoulder. No adenopathy. There is aortic atherosclerosis. IMPRESSION: Patchy bibasilar atelectasis. No edema or consolidation. Stable cardiomegaly. Aortic atherosclerosis. Aortic Atherosclerosis (ICD10-I70.0). Electronically Signed   By: Lowella Grip III M.D.   On: 12/16/2017 07:34     ASSESSMENT AND PLAN:   72 year old male with past medical history of chronic atrial fibrillation, coronary artery disease, history of CHF, hypertension, hyperlipidemia, history of peptic ulcer disease neuropathy, renal artery stenosis, obstructive sleep apnea, spinal stenosis who presented to the hospital to total right hip replacement but postoperatively developed altered mental status/confusion and noted to have sepsis.  1.  Altered mental status-this was metabolic encephalopathy secondary to underlying sepsis.  Patient underwent an extensive neurologic workup including CT head, MRI of the brain which is negative for any acute intracranial pathology. - After treatment for underlying sepsis with antibiotics and fluids patient's mental status is not back to baseline.  2.  Sepsis-this was secondary to pneumonia.  Patient was initially treated with broad-spectrum IV antibiotics with vancomycin, Zosyn.  None narrowed down to just oral Augmentin.  Patient will continue Augmentin for additional 5 days.  3.  Essential hypertension-patient will continue his hydralazine, Imdur, Coreg.   4.  History of gout-no acute attack.  Continue colchicine.  5.  Neuropathy-patient will continue gabapentin.  6.  Diabetes type 2 without complication-we will continue his Levemir, NovoLog with meals with sliding scale insulin coverage.  7.  Status post  right hip arthroplasty- cont. Further care as per Ortho.  - pt. To be discharged to SNF for ongoing rehab.   8. Hx of chronic A. Fib - rate controlled. Cont. Coreg.   - INR subtherapeutic.  Cont. Bridging with Lovenox and cont. Coumadin. Follow INR at SNF on Monday.   Stable to be discharged to SNF from medical standpoint and discussed w/ Ortho.    All the records are reviewed and case discussed with Care Management/Social Worker. Management plans discussed with the patient, family and they are in agreement.  CODE STATUS: full code  DVT Prophylaxis: Coumadin  TOTAL TIME TAKING CARE OF THIS PATIENT: 30 minutes.   POSSIBLE D/C IN 1-2 DAYS, DEPENDING ON CLINICAL CONDITION.   Henreitta Leber M.D on 12/17/2017 at 1:57 PM  Between 7am to 6pm - Pager - [phone]  After 6pm go to [email or web address] - Proofreader  [facility]  Office  [phone]  CC: Primary care physician; Cassandria Santee, MD

## 2017-12-17 NOTE — Progress Notes (Signed)
Subjective: 4 Days Post-Op Procedure(s) (LRB): TOTAL HIP ARTHROPLASTY ANTERIOR APPROACH (Right) Patient reports pain as mild.  Improving.  Denies any other complaints.  Wife states that he has improved compared to yesterday. Started on oral Abx, Augmentin yesterday, low grade temp of 99.2 last night. Denies any CP, SOB, ABD pain. We will continue therapy today.  Plan is to go Skilled nursing facility after hospital stay.  Objective: Vital signs in last 24 hours: Temp:  [98.8 F (37.1 C)-100.8 F (38.2 C)] 99.2 F (37.3 C) (04/06 0734) Pulse Rate:  [65-72] 65 (04/06 0734) Resp:  [18-19] 18 (04/06 0734) BP: (128-157)/(51-71) 134/71 (04/06 0734) SpO2:  [90 %-97 %] 95 % (04/06 0734)  Intake/Output from previous day: 04/05 0701 - 04/06 0700 In: 1456.7 [P.O.:960; I.V.:496.7] Out: 600 [Urine:600] Intake/Output this shift: No intake/output data recorded.  Recent Labs    12/14/17 1016 12/14/17 1832 12/16/17 0440  HGB 11.5* 11.7* 9.6*   Recent Labs    12/14/17 1832 12/16/17 0440  WBC 8.6 10.4  RBC 3.45* 2.80*  HCT 34.2* 27.5*  PLT 204 190   Recent Labs    12/14/17 1832 12/16/17 0440  NA 134* 132*  K 4.0 3.5  CL 100* 96*  CO2 24 28  BUN 13 15  CREATININE 0.93 0.96  GLUCOSE 224* 208*  CALCIUM 8.7* 8.0*   Recent Labs    12/16/17 0440 12/17/17 0324  INR 1.22 1.25    EXAM General - Patient is Alert, Appropriate and Oriented Extremity - Neurovascular intact Sensation intact distally Intact pulses distally Dorsiflexion/Plantar flexion intact No cellulitis present Compartment soft  No edema lower extremities Dressing - dressing C/D/I and wound vac intact with out drainage Motor Function - intact, moving foot and toes well on exam.   Past Medical History:  Diagnosis Date  . Arthritis   . Atrial fibrillation ([place])   . CAD (coronary artery disease)    6 STENTS  . CHF (congestive heart failure) ([place])   . Chicken pox   . Colon polyp   . Corneal dystrophy     bilateral  . Crohn's disease ([place])   . Diabetes ([place]) 2002  . Dysrhythmia    A-FIB AND PAF  . GERD (gastroesophageal reflux disease)   . Gout   . History of kidney stones   . History of shingles   . Hyperlipemia   . Hypertension   . Kidney stones   . Myocardial infarction ([place])    x4, last one in 2010  . Peripheral neuropathy   . Peripheral neuropathy   . Peripheral neuropathy   . PUD (peptic ulcer disease)   . Renal artery stenosis ([place])   . Renal artery stenosis ([place])   . Salzmann's nodular dystrophy 2012  . Sleep apnea   . Spinal stenosis     Assessment/Plan:   4 Days Post-Op Procedure(s) (LRB): TOTAL HIP ARTHROPLASTY ANTERIOR APPROACH (Right) Active Problems:   Osteoarthritis of right hip  Estimated body mass index is 31.61 kg/m as calculated from the following:   Height as of this encounter: 5' 8"  (1.727 m).   Weight as of this encounter: 94.3 kg (207 lb 14.3 oz). Advance diet Up with therapy   Pt has had a BM. Woundvac intact to the right hip.  WIll discharge with [facility] woundvac. INR 1.2, will continue to bridge with Lovenox, recheck INR at SNF on Monday. Hg 9.6 this AM. Per internal medicine the patient can be safely discharged to rehab today on oral Abx.  DVT Prophylaxis - Lovenox and Plavix and coumadin Weight-Bearing as tolerated to right leg  J. Cameron Proud, PA-C [place] 12/17/2017, 9:28 AM

## 2017-12-19 ENCOUNTER — Other Ambulatory Visit: Payer: Self-pay

## 2017-12-19 ENCOUNTER — Telehealth: Payer: Self-pay | Admitting: Internal Medicine

## 2017-12-19 LAB — CULTURE, BLOOD (ROUTINE X 2)
Culture: NO GROWTH
Special Requests: ADEQUATE

## 2017-12-19 NOTE — Telephone Encounter (Signed)
Copied from [place]. Topic: General - Other >> Dec 19, 2017 10:34 AM Cecelia Byars, NT wrote: Reason for CRM:  Broadus John from [facility] called to get a clarification on insulin for this patient please call him at  [phone]

## 2017-12-19 NOTE — Telephone Encounter (Signed)
Patient last saw Dr. Aundra Dubin on 11-18-17 for an acute visit for a cough, he will be seeing her on 02-27-18 , also Dr. Aundra Dubin does not prescribe his insulin at this moment.

## 2017-12-20 DIAGNOSIS — K219 Gastro-esophageal reflux disease without esophagitis: Secondary | ICD-10-CM | POA: Diagnosis not present

## 2017-12-20 DIAGNOSIS — I252 Old myocardial infarction: Secondary | ICD-10-CM | POA: Diagnosis not present

## 2017-12-20 DIAGNOSIS — I480 Paroxysmal atrial fibrillation: Secondary | ICD-10-CM | POA: Diagnosis not present

## 2017-12-20 DIAGNOSIS — Z794 Long term (current) use of insulin: Secondary | ICD-10-CM | POA: Diagnosis not present

## 2017-12-20 DIAGNOSIS — M48061 Spinal stenosis, lumbar region without neurogenic claudication: Secondary | ICD-10-CM | POA: Diagnosis not present

## 2017-12-20 DIAGNOSIS — R1900 Intra-abdominal and pelvic swelling, mass and lump, unspecified site: Secondary | ICD-10-CM | POA: Diagnosis not present

## 2017-12-20 DIAGNOSIS — E1122 Type 2 diabetes mellitus with diabetic chronic kidney disease: Secondary | ICD-10-CM | POA: Diagnosis not present

## 2017-12-20 DIAGNOSIS — Z96649 Presence of unspecified artificial hip joint: Secondary | ICD-10-CM | POA: Diagnosis not present

## 2017-12-20 DIAGNOSIS — Z951 Presence of aortocoronary bypass graft: Secondary | ICD-10-CM | POA: Diagnosis not present

## 2017-12-20 DIAGNOSIS — M4800 Spinal stenosis, site unspecified: Secondary | ICD-10-CM | POA: Diagnosis not present

## 2017-12-20 DIAGNOSIS — I2510 Atherosclerotic heart disease of native coronary artery without angina pectoris: Secondary | ICD-10-CM | POA: Diagnosis not present

## 2017-12-20 DIAGNOSIS — I4891 Unspecified atrial fibrillation: Secondary | ICD-10-CM | POA: Diagnosis not present

## 2017-12-20 DIAGNOSIS — I701 Atherosclerosis of renal artery: Secondary | ICD-10-CM | POA: Diagnosis not present

## 2017-12-20 DIAGNOSIS — I10 Essential (primary) hypertension: Secondary | ICD-10-CM | POA: Diagnosis not present

## 2017-12-20 DIAGNOSIS — N182 Chronic kidney disease, stage 2 (mild): Secondary | ICD-10-CM | POA: Diagnosis not present

## 2017-12-20 DIAGNOSIS — M1990 Unspecified osteoarthritis, unspecified site: Secondary | ICD-10-CM | POA: Diagnosis not present

## 2017-12-20 DIAGNOSIS — N400 Enlarged prostate without lower urinary tract symptoms: Secondary | ICD-10-CM | POA: Diagnosis not present

## 2017-12-20 DIAGNOSIS — K639 Disease of intestine, unspecified: Secondary | ICD-10-CM | POA: Diagnosis not present

## 2017-12-20 DIAGNOSIS — M1611 Unilateral primary osteoarthritis, right hip: Secondary | ICD-10-CM | POA: Diagnosis not present

## 2017-12-20 DIAGNOSIS — E1140 Type 2 diabetes mellitus with diabetic neuropathy, unspecified: Secondary | ICD-10-CM | POA: Diagnosis not present

## 2017-12-20 DIAGNOSIS — M109 Gout, unspecified: Secondary | ICD-10-CM | POA: Diagnosis not present

## 2017-12-20 DIAGNOSIS — Z471 Aftercare following joint replacement surgery: Secondary | ICD-10-CM | POA: Diagnosis not present

## 2017-12-20 DIAGNOSIS — Z96642 Presence of left artificial hip joint: Secondary | ICD-10-CM | POA: Diagnosis not present

## 2017-12-20 DIAGNOSIS — E785 Hyperlipidemia, unspecified: Secondary | ICD-10-CM | POA: Diagnosis not present

## 2017-12-20 DIAGNOSIS — Z7982 Long term (current) use of aspirin: Secondary | ICD-10-CM | POA: Diagnosis not present

## 2017-12-20 DIAGNOSIS — K5090 Crohn's disease, unspecified, without complications: Secondary | ICD-10-CM | POA: Diagnosis not present

## 2017-12-20 DIAGNOSIS — E119 Type 2 diabetes mellitus without complications: Secondary | ICD-10-CM | POA: Diagnosis not present

## 2017-12-20 DIAGNOSIS — Z96641 Presence of right artificial hip joint: Secondary | ICD-10-CM | POA: Diagnosis not present

## 2017-12-20 LAB — CULTURE, BLOOD (ROUTINE X 2): CULTURE: NO GROWTH

## 2017-12-21 ENCOUNTER — Other Ambulatory Visit: Payer: Self-pay | Admitting: General Surgery

## 2017-12-21 DIAGNOSIS — R1900 Intra-abdominal and pelvic swelling, mass and lump, unspecified site: Secondary | ICD-10-CM

## 2017-12-21 NOTE — Progress Notes (Signed)
Case reviewed at [facility] tumor board. Recommendation to determine 5-HIAA in light of patient's reports of diarrhea (but no flushing).  Med/ food exclusion list will be reviewed with the patient prior to collection.   Review of Up to Date recommended against Chromagranin A testing as not currently standardized.

## 2017-12-22 DIAGNOSIS — N182 Chronic kidney disease, stage 2 (mild): Secondary | ICD-10-CM | POA: Diagnosis not present

## 2017-12-22 DIAGNOSIS — I480 Paroxysmal atrial fibrillation: Secondary | ICD-10-CM | POA: Diagnosis not present

## 2017-12-22 DIAGNOSIS — M48061 Spinal stenosis, lumbar region without neurogenic claudication: Secondary | ICD-10-CM | POA: Diagnosis not present

## 2017-12-22 DIAGNOSIS — E1122 Type 2 diabetes mellitus with diabetic chronic kidney disease: Secondary | ICD-10-CM | POA: Diagnosis not present

## 2017-12-22 DIAGNOSIS — I2510 Atherosclerotic heart disease of native coronary artery without angina pectoris: Secondary | ICD-10-CM | POA: Diagnosis not present

## 2017-12-22 DIAGNOSIS — M1611 Unilateral primary osteoarthritis, right hip: Secondary | ICD-10-CM | POA: Diagnosis not present

## 2017-12-29 ENCOUNTER — Other Ambulatory Visit: Payer: Medicare Other | Admitting: Urology

## 2017-12-29 ENCOUNTER — Other Ambulatory Visit: Payer: Self-pay | Admitting: Internal Medicine

## 2017-12-29 MED ORDER — HYDRALAZINE HCL 100 MG PO TABS: ORAL_TABLET | ORAL | 1 refills | Status: DC

## 2018-01-04 DIAGNOSIS — E119 Type 2 diabetes mellitus without complications: Secondary | ICD-10-CM | POA: Diagnosis not present

## 2018-01-04 DIAGNOSIS — I10 Essential (primary) hypertension: Secondary | ICD-10-CM | POA: Diagnosis not present

## 2018-01-04 DIAGNOSIS — I4891 Unspecified atrial fibrillation: Secondary | ICD-10-CM | POA: Diagnosis not present

## 2018-01-04 DIAGNOSIS — Z96649 Presence of unspecified artificial hip joint: Secondary | ICD-10-CM | POA: Diagnosis not present

## 2018-01-10 DIAGNOSIS — I480 Paroxysmal atrial fibrillation: Secondary | ICD-10-CM | POA: Diagnosis not present

## 2018-01-10 DIAGNOSIS — E1122 Type 2 diabetes mellitus with diabetic chronic kidney disease: Secondary | ICD-10-CM | POA: Diagnosis not present

## 2018-01-10 DIAGNOSIS — I129 Hypertensive chronic kidney disease with stage 1 through stage 4 chronic kidney disease, or unspecified chronic kidney disease: Secondary | ICD-10-CM | POA: Diagnosis not present

## 2018-01-10 DIAGNOSIS — N182 Chronic kidney disease, stage 2 (mild): Secondary | ICD-10-CM | POA: Diagnosis not present

## 2018-01-10 DIAGNOSIS — Z471 Aftercare following joint replacement surgery: Secondary | ICD-10-CM | POA: Diagnosis not present

## 2018-01-10 DIAGNOSIS — E1140 Type 2 diabetes mellitus with diabetic neuropathy, unspecified: Secondary | ICD-10-CM | POA: Diagnosis not present

## 2018-01-11 ENCOUNTER — Emergency Department
Admission: EM | Admit: 2018-01-11 | Discharge: 2018-01-11 | Disposition: A | Discharge status: Home / Self Care | Payer: Medicare Other | Attending: Emergency Medicine | Admitting: Emergency Medicine

## 2018-01-11 ENCOUNTER — Encounter: Payer: Self-pay | Admitting: Emergency Medicine

## 2018-01-11 ENCOUNTER — Ambulatory Visit: Payer: Self-pay | Admitting: *Deleted

## 2018-01-11 ENCOUNTER — Other Ambulatory Visit: Payer: Self-pay

## 2018-01-11 ENCOUNTER — Emergency Department: Payer: Medicare Other

## 2018-01-11 DIAGNOSIS — Z87891 Personal history of nicotine dependence: Secondary | ICD-10-CM | POA: Insufficient documentation

## 2018-01-11 DIAGNOSIS — Y92002 Bathroom of unspecified non-institutional (private) residence single-family (private) house as the place of occurrence of the external cause: Secondary | ICD-10-CM | POA: Diagnosis not present

## 2018-01-11 DIAGNOSIS — Z96643 Presence of artificial hip joint, bilateral: Secondary | ICD-10-CM | POA: Diagnosis not present

## 2018-01-11 DIAGNOSIS — I110 Hypertensive heart disease with heart failure: Secondary | ICD-10-CM | POA: Insufficient documentation

## 2018-01-11 DIAGNOSIS — W182XXA Fall in (into) shower or empty bathtub, initial encounter: Secondary | ICD-10-CM | POA: Diagnosis not present

## 2018-01-11 DIAGNOSIS — Z955 Presence of coronary angioplasty implant and graft: Secondary | ICD-10-CM | POA: Diagnosis not present

## 2018-01-11 DIAGNOSIS — E1122 Type 2 diabetes mellitus with diabetic chronic kidney disease: Secondary | ICD-10-CM | POA: Diagnosis not present

## 2018-01-11 DIAGNOSIS — I2510 Atherosclerotic heart disease of native coronary artery without angina pectoris: Secondary | ICD-10-CM | POA: Diagnosis not present

## 2018-01-11 DIAGNOSIS — S8002XA Contusion of left knee, initial encounter: Secondary | ICD-10-CM | POA: Insufficient documentation

## 2018-01-11 DIAGNOSIS — I509 Heart failure, unspecified: Secondary | ICD-10-CM | POA: Diagnosis not present

## 2018-01-11 DIAGNOSIS — Y93E1 Activity, personal bathing and showering: Secondary | ICD-10-CM | POA: Diagnosis not present

## 2018-01-11 DIAGNOSIS — E1140 Type 2 diabetes mellitus with diabetic neuropathy, unspecified: Secondary | ICD-10-CM | POA: Insufficient documentation

## 2018-01-11 DIAGNOSIS — Y999 Unspecified external cause status: Secondary | ICD-10-CM | POA: Diagnosis not present

## 2018-01-11 DIAGNOSIS — Z471 Aftercare following joint replacement surgery: Secondary | ICD-10-CM | POA: Diagnosis not present

## 2018-01-11 DIAGNOSIS — Z794 Long term (current) use of insulin: Secondary | ICD-10-CM | POA: Insufficient documentation

## 2018-01-11 DIAGNOSIS — Z79899 Other long term (current) drug therapy: Secondary | ICD-10-CM | POA: Insufficient documentation

## 2018-01-11 DIAGNOSIS — I480 Paroxysmal atrial fibrillation: Secondary | ICD-10-CM | POA: Diagnosis not present

## 2018-01-11 DIAGNOSIS — M25462 Effusion, left knee: Secondary | ICD-10-CM | POA: Diagnosis not present

## 2018-01-11 DIAGNOSIS — N182 Chronic kidney disease, stage 2 (mild): Secondary | ICD-10-CM | POA: Diagnosis not present

## 2018-01-11 DIAGNOSIS — W19XXXA Unspecified fall, initial encounter: Secondary | ICD-10-CM

## 2018-01-11 DIAGNOSIS — I129 Hypertensive chronic kidney disease with stage 1 through stage 4 chronic kidney disease, or unspecified chronic kidney disease: Secondary | ICD-10-CM | POA: Diagnosis not present

## 2018-01-11 DIAGNOSIS — Z7901 Long term (current) use of anticoagulants: Secondary | ICD-10-CM | POA: Diagnosis not present

## 2018-01-11 DIAGNOSIS — S8262XA Displaced fracture of lateral malleolus of left fibula, initial encounter for closed fracture: Secondary | ICD-10-CM | POA: Diagnosis not present

## 2018-01-11 DIAGNOSIS — S8992XA Unspecified injury of left lower leg, initial encounter: Secondary | ICD-10-CM | POA: Diagnosis present

## 2018-01-11 NOTE — ED Notes (Signed)
Patient with left knee swelling and pain to left leg and ankle after falling in shower. Denies hitting head. Denies LOC. Denies neck or back pain.

## 2018-01-11 NOTE — Telephone Encounter (Signed)
fyi

## 2018-01-11 NOTE — Telephone Encounter (Signed)
Called both number home and cell for Chase Cabrera, no answer and unable to leave a message. Home Health RN, Jari Pigg, called back to let her know that the wife or patient could not be contacted. Asked Bea to let the wife know to call us so we can triage the patient.

## 2018-01-11 NOTE — ED Notes (Signed)
Patient placed on 2L of oxygen by nasal cannula.

## 2018-01-11 NOTE — ED Provider Notes (Signed)
[facility] Emergency Department Provider Note  ____________________________________________  Time seen: Approximately 7:35 PM  I have reviewed the triage vital signs and the nursing notes.   HISTORY  Chief Complaint Fall and Leg Pain    HPI COLONEL KRAUSER is a 72 y.o. male with a history of CAD, CHF, atrial fibrillation, and diabetes who complains of left knee and left ankle pain after a fall at home today. he had right hip replacement 12/13/2017, spent 4 weeks in a rehabilitation facility, and just returned home within the past few days. He is try to step in the shower today when the suction cup grab bar he was holding onto came loose and he tripped over the edge and fell.  denies head injury. No headache vision change loss of consciousness or neck pain.  they normally patient normally uses a walker but feels like it is too painful to stand and bear weight on the left leg after the fall. He does have an electric scooter that he uses as well for mobility.  Denies any acute chest pain or shortness of breath or exertional symptoms since surgery. No unusual leg pain or swelling prior to today's fall..he does report having chronic shortness of breath that has been going on for many months prior to surgery. It is not worse since surgery.     Past Medical History:  Diagnosis Date  . Arthritis   . Atrial fibrillation ([place])   . CAD (coronary artery disease)    6 STENTS  . CHF (congestive heart failure) ([place])   . Chicken pox   . Colon polyp   . Corneal dystrophy    bilateral  . Crohn's disease ([place])   . Diabetes ([place]) 2002  . Dysrhythmia    A-FIB AND PAF  . GERD (gastroesophageal reflux disease)   . Gout   . History of kidney stones   . History of shingles   . Hyperlipemia   . Hypertension   . Kidney stones   . Myocardial infarction ([place])    x4, last one in 2010  . Peripheral neuropathy   . Peripheral neuropathy   . Peripheral neuropathy   . PUD  (peptic ulcer disease)   . Renal artery stenosis ([place])   . Renal artery stenosis ([place])   . Salzmann's nodular dystrophy 2012  . Sleep apnea   . Spinal stenosis      Patient Active Problem List   Diagnosis Date Noted  . Osteoarthritis of right hip 12/13/2017  . Intraabdominal mass 11/23/2017  . Colonic mass 11/18/2017  . Crohn's disease ([place]) 10/20/2017  . Pulmonary nodule 10/07/2017  . History of skin cancer 10/07/2017  . Rib pain on right side 03/07/2017  . Upper respiratory tract infection 09/09/2016  . Osteoarthritis 04/08/2016  . Spinal stenosis, lumbar 03/25/2016  . DM type 2 with diabetic peripheral neuropathy ([place]) 07/15/2014  . Gout 05/04/2013  . Long term current use of anticoagulant 07/29/2011  . Renal artery stenosis ([place]) 07/01/2011  . Atrial fibrillation ([place]) 06/30/2011  . CAD (coronary artery disease) 06/30/2011  . Hyperlipidemia 06/30/2011  . Essential hypertension 06/30/2011     Past Surgical History:  Procedure Laterality Date  . ABLATION  2012  . APPLICATION VERTERBRAL DEFECT PROSTHETIC  05/01/2013  . ARTHRODESIS ANTERIOR LUMBAR SPINE  05/01/2013  . BACK SURGERY     lumbar fusion  . CARDIAC CATHETERIZATION    . CARDIAC ELECTROPHYSIOLOGY STUDY AND ABLATION    . CARDIAC SURGERY    .  CARDIOVERSION    . CHOLECYSTECTOMY    . COLONOSCOPY WITH PROPOFOL N/A 04/09/2016   Completed for chronic diarrhea.  Few scattered diverticuli.  No mucosal lesions.  Surgeon: Lollie Sails, MD;  Location: [facility] ENDOSCOPY;  Service: Endoscopy;  Laterality: N/A;  . CORNEAL EYE SURGERY Bilateral 07/28/11    09/22/2011  . CORONARY ANGIOPLASTY    . CORONARY ANGIOPLASTY WITH STENT PLACEMENT  03/28/2015   Distal 80% to normal Stent, Dilation Balloon  . CORONARY ARTERY BYPASS GRAFT     4 VESSELS  . EYE SURGERY     bilateral cataract  . foot and ankle repair Right 2005  . FRACTURE SURGERY     ANKLE PLATE AND SCREWS  . GALLBLADER    . JOINT REPLACEMENT     left total hip  11/11/15  . KNEE ARTHROSCOPY Right   . LUMBAR SPINE FUSION ONE LEVEL  05/03/2013  . RENAL ARTERY STENT Right   . ROTATOR CUFF REPAIR Right 2006  . TONSILLECTOMY    . TOTAL HIP ARTHROPLASTY Left 11/11/2015   Procedure: TOTAL HIP ARTHROPLASTY ANTERIOR APPROACH;  Surgeon: Hessie Knows, MD;  Location: [facility] ORS;  Service: Orthopedics;  Laterality: Left;  . TOTAL HIP ARTHROPLASTY Right 12/13/2017   Procedure: TOTAL HIP ARTHROPLASTY ANTERIOR APPROACH;  Surgeon: Hessie Knows, MD;  Location: [facility] ORS;  Service: Orthopedics;  Laterality: Right;  . TRANSCATH PLACEMENT INTRAVASCULAR STENT LEG  03/2015     Prior to Admission medications   Medication Sig Start Date End Date Taking? Authorizing Provider  amLODipine (NORVASC) 10 MG tablet TAKE 1 TABLET BY MOUTH ONCE DAILY Patient taking differently: Take 10 mg by mouth at bedtime 09/16/17   McLean-Scocuzza, Nino Glow, MD  azelastine (ASTELIN) 0.1 % nasal spray Place 2 sprays into both nostrils 2 (two) times daily. Use in each nostril as directed Patient taking differently: Place 2 sprays into both nostrils daily. Use in each nostril as directed 09/09/16   Coral Spikes, DO  carvedilol (COREG) 25 MG tablet Take 1 tablet (25 mg total) by mouth 2 (two) times daily with a meal. 10/06/17   McLean-Scocuzza, Nino Glow, MD  Cholecalciferol 50000 units TABS Take 1 tablet by mouth once a week. Patient taking differently: Take 50,000 Units by mouth every Saturday.  10/07/17   McLean-Scocuzza, Nino Glow, MD  clopidogrel (PLAVIX) 75 MG tablet Take 75 mg by mouth daily.    [provider]  colchicine 0.6 MG tablet TAKE 1 TABLET BY MOUTH ONCE DAILY Patient taking differently: TAKE 0.6 MG BY MOUTH ONCE DAILY 07/12/17   Leone Haven, MD  diclofenac sodium (VOLTAREN) 1 % GEL Apply 2 g topically 4 (four) times daily as needed (for pain).     [provider]  dicyclomine (BENTYL) 10 MG capsule Take 10 mg by mouth 3 (three) times daily before meals.     [provider]  docusate sodium (COLACE) 100 MG capsule Take 1 capsule (100 mg total) by mouth 2 (two) times daily. 12/16/17   Duanne Guess, PA-C  enoxaparin (LOVENOX) 40 MG/0.4ML injection Inject 0.4 mLs (40 mg total) into the skin daily. 12/17/17   Lattie Corns, PA-C  gabapentin (NEURONTIN) 300 MG capsule take 1 capsule by mouth three times a day Patient taking differently: Take 600 mg by mouth 3 times daily 04/19/16   Thersa Salt G, DO  hydrALAZINE (APRESOLINE) 100 MG tablet TAKE 100 MG BY MOUTH THREE TIMES A DAY 12/29/17   McLean-Scocuzza, Nino Glow, MD  HYDROcodone-acetaminophen (NORCO) 7.5-325 MG tablet Take 1-2 tablets by mouth every 4 (four) hours as needed for severe pain (pain score 7-10). 12/16/17   Duanne Guess, PA-C  hydrocortisone 2.5 % cream Apply topically 2 (two) times daily. Patient taking differently: Apply 1 application topically daily as needed (rash behind ear).  10/03/17   McLean-Scocuzza, Nino Glow, MD  insulin lispro (HUMALOG) 100 UNIT/ML KiwkPen Inject 15-30 units SQ up to three times daily per sliding scale 05/03/16   [provider]  isosorbide mononitrate (IMDUR) 120 MG 24 hr tablet TAKE 1 TABLET BY MOUTH EVERY DAY Patient taking differently: TAKE 120 MG BY MOUTH EVERY DAY 10/07/17   McLean-Scocuzza, Nino Glow, MD  LEVEMIR FLEXTOUCH 100 UNIT/ML Pen Inject 90 Units into the skin daily at 10 pm.  08/24/16   [provider]  mesalamine (LIALDA) 1.2 g EC tablet Take 2.4 g by mouth daily with breakfast. [facility]    [provider]  metFORMIN (GLUCOPHAGE) 1000 MG tablet Take 1,000 mg by mouth 2 (two) times daily.  01/01/15   [provider]  Multiple Vitamin (MULTI-VITAMINS) TABS Take 1 tablet by mouth daily. 04/15/09   [provider]  nitroGLYCERIN (NITROSTAT) 0.4 MG SL tablet Place 1 tablet (0.4 mg total) under the tongue every 5 (five) minutes x 3 doses as needed. For chest pain.  Call 911 if no relief after 3 tablets 10/03/17 11/02/21   McLean-Scocuzza, Nino Glow, MD  omeprazole (PRILOSEC) 40 MG capsule Take 40 mg by mouth 2 (two) times daily.     [provider]  pravastatin (PRAVACHOL) 40 MG tablet TAKE 1 TABLET BY MOUTH ONCE DAILY Patient taking differently: TAKE 40 MG BY MOUTH ONCE DAILY 10/07/17   McLean-Scocuzza, Nino Glow, MD  probenecid (BENEMID) 500 MG tablet Take 500 mg by mouth 2 (two) times daily.  01/09/15   [provider]  vitamin C (ASCORBIC ACID) 500 MG tablet Take 500 mg by mouth daily.     [provider]  Vitamin E 400 units TABS Take 400 Units by mouth 2 (two) times daily.    [provider]  warfarin (COUMADIN) 3 MG tablet 1 tablet  Monday, Wednesday, Friday. Patient taking differently: Take 3 mg by mouth See admin instructions. Take 3 mg by mouth daily on Sundays and Mondays 11/16/16   Coral Spikes, DO  warfarin (COUMADIN) 4 MG tablet 1 tablet Tuesday, Thursday, Saturday, and Sunday. Patient taking differently: Take 4 mg by mouth See admin instructions. Take 4 mg by mouth daily on Tuesday, Wednesday, Thursday, Friday and Saturday 10/03/17   McLean-Scocuzza, Nino Glow, MD     Allergies Allopurinol; Atenolol; Atorvastatin; Ramipril; Rosuvastatin; Simvastatin; Valsartan; and Cardizem [diltiazem]   Family History  Problem Relation Age of Onset  . CVA Father   . Stroke Father   . Lung cancer Mother   . Arthritis Mother   . Stomach cancer Sister   . Colon cancer Sister   . Diabetes Brother     Social History Social History   Tobacco Use  . Smoking status: Former Smoker    Packs/day: 1.00    Years: 3.00    Pack years: 3.00    Types: Cigarettes    Last attempt to quit: 06/30/1962    Years since quitting: 55.5  . Smokeless tobacco: Former Systems developer    Types: Chew    Quit date: 12/05/1968  Substance Use Topics  . Alcohol use: No  . Drug use: No    Review  of Systems  Constitutional:   No fever or chills.  ENT:   No sore throat. No rhinorrhea. Cardiovascular:   No  chest pain or syncope. Respiratory:   chronic baseline shortness of breath without cough. Gastrointestinal:   Negative for abdominal pain, vomiting and diarrhea.  Musculoskeletal:   subacute postoperative pain of the right hip. Acute pain of left ankle and left knee after fall today. All other systems reviewed and are negative except as documented above in ROS and HPI.  ____________________________________________   PHYSICAL EXAM:  VITAL SIGNS: ED Triage Vitals  Enc Vitals Group     BP 01/11/18 1638 (!) 133/53     Pulse Rate 01/11/18 1638 (!) 54     Resp 01/11/18 1919 18     Temp 01/11/18 1638 98.4 F (36.9 C)     Temp Source 01/11/18 1638 Oral     SpO2 01/11/18 1638 92 %     Weight 01/11/18 1638 188 lb (85.3 kg)     Height 01/11/18 1638 5' 8"  (1.727 m)     Head Circumference --      Peak Flow --      Pain Score 01/11/18 1655 8     Pain Loc --      Pain Edu? --      Excl. in [place]? --     Vital signs reviewed, nursing assessments reviewed.   Constitutional:   Alert and oriented. Well appearing and in no distress. Eyes:   Conjunctivae are normal. EOMI. PERRL. ENT      Head:   Normocephalic and atraumatic.      Nose:   No congestion/rhinnorhea.       Mouth/Throat:   MMM, no pharyngeal erythema. No peritonsillar mass.       Neck:   No meningismus. Full ROM. Hematological/Lymphatic/Immunilogical:   No cervical lymphadenopathy. Cardiovascular:   RRR. Symmetric bilateral radial and DP pulses.  No murmurs.  Respiratory:   Normal respiratory effort without tachypnea/retractions. Breath sounds are clear and equal bilaterally. No wheezes/rales/rhonchi. Gastrointestinal:   Soft and nontender. Non distended. There is no CVA tenderness.  No rebound, rigidity, or guarding.no hernia  Musculoskeletal:   Normal range of motion in all extremities. left knee stable. left ankle stable. There is an effusion of the left knee. There is swelling about the left ankle with tenderness at the anterior  lateral malleolus..  no bony point tenderness. No tenderness at the tibial plateau.  No edema.no inflammatory changes. Hip joint is stable with painless ROM Neurologic:   Normal speech and language.  Motor grossly intact. No acute focal neurologic deficits are appreciated.  Skin:    Skin is warm, dry and intact. No rash noted.  No petechiae, purpura, or bullae.  ____________________________________________    LABS (pertinent positives/negatives) (all labs ordered are listed, but only abnormal results are displayed) Labs Reviewed - No data to display ____________________________________________   EKG    ____________________________________________    RADIOLOGY  Dg Knee 2 Views Left  Result Date: 01/11/2018 CLINICAL DATA:  [place] in shower.  LEFT knee pain. EXAM: LEFT KNEE - 1-2 VIEW COMPARISON:  None. FINDINGS: No evidence of fracture, dislocation, or joint effusion. Osteopenia. No advanced arthropathy or other focal bone abnormality. Small suprapatellar joint effusion. Mild vascular calcifications. IMPRESSION: Small suprapatellar joint effusion without acute osseous process. Electronically Signed   By: Elon Alas M.D.   On: 01/11/2018 18:57   Dg Tibia/fibula Left  Result Date: 01/11/2018 CLINICAL DATA:  Fall, leg pain EXAM:  LEFT TIBIA AND FIBULA - 2 VIEW COMPARISON:  None. FINDINGS: No fracture or dislocation is seen. Status post ORIF of the distal fibula and medial malleolus. The visualized soft tissues are unremarkable. Suspected small suprapatellar knee joint effusion. IMPRESSION: No fracture or dislocation is seen. Status post ORIF of the distal fibula and medial malleolus. Electronically Signed   By: Julian Hy M.D.   On: 01/11/2018 18:57   Dg Ankle Complete Left  Result Date: 01/11/2018 CLINICAL DATA:  Fall, left leg pain EXAM: LEFT ANKLE COMPLETE - 3+ VIEW COMPARISON:  None. FINDINGS: Lateral plate and screw fixation of the distal fibula. Two medial malleolar  screws. No fracture or dislocation is seen. The ankle mortise is intact. The visualized soft tissues are unremarkable. IMPRESSION: No fracture or dislocation is seen. Status post ORIF of the distal fibula and medial malleolus. Electronically Signed   By: Julian Hy M.D.   On: 01/11/2018 18:58    ____________________________________________   PROCEDURES Procedures  ____________________________________________    CLINICAL IMPRESSION / ASSESSMENT AND PLAN / ED COURSE  Pertinent labs & imaging results that were available during my care of the patient were reviewed by me and considered in my medical decision making (see chart for details).    patient presents with left ankle and left knee pain after a inadvertent mechanical fall today. X-rays are negative for fracture or other injury in the left ankle, tib/fib, knee. Low suspicion for occult fracture. No evidence of laceration or infectious process. I doubt a hip injury. Additionally, the chronic shortness of breath does not appear to be a concern and despite his recent surgery I don't think that this warrants workup for pulmonary embolism or ACS at this time. Patient is stable for discharge home, continue all his current medications including his oxycodone for pain control, Ace wrap to left ankle for ankle sprain, ice to left knee for contusion, follow-up with orthopedics.      ____________________________________________   FINAL CLINICAL IMPRESSION(S) / ED DIAGNOSES    Final diagnoses:  Fall, initial encounter  Contusion of left knee, initial encounter     ED Discharge Orders    None      Portions of this note were generated with dragon dictation software. Dictation errors may occur despite best attempts at proofreading.    Carrie Mew, MD 01/11/18 1943

## 2018-01-11 NOTE — Telephone Encounter (Signed)
Pt.'s wife called back to report they will be going to ED for evaluation of left ankle pain after a fall at home. States he can not put weight on that leg. States they will call EMS to transport.

## 2018-01-11 NOTE — ED Triage Notes (Signed)
Pt has recently had hip surgery, just got home Saturday from rehab. He was at home and was getting into the shower and the bar fell off and he fell in the shower. He is complaining of left knee, leg and ankle pain. PT came out to evaluate him and told him to come to the ER. Left knee is swollen. Pts wife states that he has been SOB all week. He was 92% on room air.

## 2018-01-12 ENCOUNTER — Ambulatory Visit (INDEPENDENT_AMBULATORY_CARE_PROVIDER_SITE_OTHER): Payer: Medicare Other | Admitting: Internal Medicine

## 2018-01-12 ENCOUNTER — Telehealth: Payer: Self-pay | Admitting: *Deleted

## 2018-01-12 ENCOUNTER — Encounter: Payer: Self-pay | Admitting: General Surgery

## 2018-01-12 ENCOUNTER — Ambulatory Visit
Admission: RE | Admit: 2018-01-12 | Discharge: 2018-01-12 | Disposition: A | Discharge status: Home / Self Care | Payer: Medicare Other | Source: Ambulatory Visit | Attending: Internal Medicine | Admitting: Internal Medicine

## 2018-01-12 ENCOUNTER — Encounter: Payer: Self-pay | Admitting: Internal Medicine

## 2018-01-12 ENCOUNTER — Other Ambulatory Visit
Admission: RE | Admit: 2018-01-12 | Discharge: 2018-01-12 | Disposition: A | Discharge status: Home / Self Care | Payer: Medicare Other | Source: Ambulatory Visit | Attending: Internal Medicine | Admitting: Internal Medicine

## 2018-01-12 VITALS — BP 94/40 | HR 64 | Temp 98.2°F | Wt 188.9 lb

## 2018-01-12 DIAGNOSIS — R05 Cough: Secondary | ICD-10-CM | POA: Insufficient documentation

## 2018-01-12 DIAGNOSIS — I959 Hypotension, unspecified: Secondary | ICD-10-CM | POA: Diagnosis not present

## 2018-01-12 DIAGNOSIS — M25561 Pain in right knee: Secondary | ICD-10-CM

## 2018-01-12 DIAGNOSIS — R634 Abnormal weight loss: Secondary | ICD-10-CM

## 2018-01-12 DIAGNOSIS — R918 Other nonspecific abnormal finding of lung field: Secondary | ICD-10-CM | POA: Insufficient documentation

## 2018-01-12 DIAGNOSIS — E559 Vitamin D deficiency, unspecified: Secondary | ICD-10-CM

## 2018-01-12 DIAGNOSIS — R0602 Shortness of breath: Secondary | ICD-10-CM | POA: Diagnosis not present

## 2018-01-12 DIAGNOSIS — J069 Acute upper respiratory infection, unspecified: Secondary | ICD-10-CM

## 2018-01-12 DIAGNOSIS — R059 Cough, unspecified: Secondary | ICD-10-CM

## 2018-01-12 DIAGNOSIS — Z7901 Long term (current) use of anticoagulants: Secondary | ICD-10-CM

## 2018-01-12 DIAGNOSIS — I482 Chronic atrial fibrillation, unspecified: Secondary | ICD-10-CM

## 2018-01-12 DIAGNOSIS — R630 Anorexia: Secondary | ICD-10-CM | POA: Diagnosis not present

## 2018-01-12 DIAGNOSIS — E119 Type 2 diabetes mellitus without complications: Secondary | ICD-10-CM

## 2018-01-12 DIAGNOSIS — G4733 Obstructive sleep apnea (adult) (pediatric): Secondary | ICD-10-CM

## 2018-01-12 DIAGNOSIS — M1711 Unilateral primary osteoarthritis, right knee: Secondary | ICD-10-CM | POA: Insufficient documentation

## 2018-01-12 DIAGNOSIS — R1900 Intra-abdominal and pelvic swelling, mass and lump, unspecified site: Secondary | ICD-10-CM

## 2018-01-12 DIAGNOSIS — E1142 Type 2 diabetes mellitus with diabetic polyneuropathy: Secondary | ICD-10-CM | POA: Diagnosis not present

## 2018-01-12 DIAGNOSIS — R269 Unspecified abnormalities of gait and mobility: Secondary | ICD-10-CM

## 2018-01-12 DIAGNOSIS — Z794 Long term (current) use of insulin: Secondary | ICD-10-CM

## 2018-01-12 DIAGNOSIS — R911 Solitary pulmonary nodule: Secondary | ICD-10-CM

## 2018-01-12 DIAGNOSIS — J849 Interstitial pulmonary disease, unspecified: Secondary | ICD-10-CM

## 2018-01-12 LAB — CBC WITH DIFFERENTIAL/PLATELET
Basophils Absolute: 0 10*3/uL (ref 0–0.1)
Basophils Relative: 0 %
EOS PCT: 4 %
Eosinophils Absolute: 0.3 10*3/uL (ref 0–0.7)
HCT: 35.9 % — ABNORMAL LOW (ref 40.0–52.0)
HEMOGLOBIN: 12.3 g/dL — AB (ref 13.0–18.0)
LYMPHS ABS: 1.1 10*3/uL (ref 1.0–3.6)
Lymphocytes Relative: 15 %
MCH: 34.3 pg — AB (ref 26.0–34.0)
MCHC: 34.4 g/dL (ref 32.0–36.0)
MCV: 99.5 fL (ref 80.0–100.0)
MONO ABS: 0.8 10*3/uL (ref 0.2–1.0)
MONOS PCT: 11 %
Neutro Abs: 5 10*3/uL (ref 1.4–6.5)
Neutrophils Relative %: 70 %
PLATELETS: 274 10*3/uL (ref 150–440)
RBC: 3.6 MIL/uL — ABNORMAL LOW (ref 4.40–5.90)
RDW: 15.3 % — AB (ref 11.5–14.5)
WBC: 7.1 10*3/uL (ref 3.8–10.6)

## 2018-01-12 LAB — COMPREHENSIVE METABOLIC PANEL
ALK PHOS: 90 U/L (ref 38–126)
ALT: 12 U/L — ABNORMAL LOW (ref 17–63)
ANION GAP: 12 (ref 5–15)
AST: 24 U/L (ref 15–41)
Albumin: 3.6 g/dL (ref 3.5–5.0)
BUN: 11 mg/dL (ref 6–20)
CO2: 24 mmol/L (ref 22–32)
Calcium: 9.2 mg/dL (ref 8.9–10.3)
Chloride: 102 mmol/L (ref 101–111)
Creatinine, Ser: 0.91 mg/dL (ref 0.61–1.24)
GFR calc non Af Amer: >60 mL/min (ref >60)
Glucose, Bld: 191 mg/dL — ABNORMAL HIGH (ref 65–99)
Potassium: 3.4 mmol/L — ABNORMAL LOW (ref 3.5–5.1)
SODIUM: 138 mmol/L (ref 135–145)
Total Bilirubin: 0.7 mg/dL (ref 0.3–1.2)
Total Protein: 7.7 g/dL (ref 6.5–8.1)

## 2018-01-12 LAB — PROTIME-INR
INR: 1.53
Prothrombin Time: 18.3 seconds — ABNORMAL HIGH (ref 11.4–15.2)

## 2018-01-12 MED ORDER — DOXYCYCLINE HYCLATE 100 MG PO TABS: 100.0000 mg | ORAL_TABLET | Freq: Two times a day (BID) | ORAL | 0 refills | Status: DC

## 2018-01-12 NOTE — Patient Instructions (Addendum)
Try Premier protein shake if you dont feel like eating for meal replacement  We will try to move up your appt with Dr. Rudene Christians orthopedics  Cut norvasc in 1/2-take at night and if blood pressure elevated take another in the am  Please take robitussin DM or Mucinex DM green label for cough  Please elevated knees higher than your heart, ice with ice, take Tylenol if 500 mg can take up to 6x per day or 3000 mg per day  Please go to [facility] for chest Xray, right knee Xray and labs     Cough, Adult Coughing is a reflex that clears your throat and your airways. Coughing helps to heal and protect your lungs. It is normal to cough occasionally, but a cough that happens with other symptoms or lasts a long time may be a sign of a condition that needs treatment. A cough may last only 2-3 weeks (acute), or it may last longer than 8 weeks (chronic). What are the causes? Coughing is commonly caused by:  Breathing in substances that irritate your lungs.  A viral or bacterial respiratory infection.  Allergies.  Asthma.  Postnasal drip.  Smoking.  Acid backing up from the stomach into the esophagus (gastroesophageal reflux).  Certain medicines.  Chronic lung problems, including COPD (or rarely, lung cancer).  Other medical conditions such as heart failure.  Follow these instructions at home: Pay attention to any changes in your symptoms. Take these actions to help with your discomfort:  Take medicines only as told by your health care provider. ? If you were prescribed an antibiotic medicine, take it as told by your health care provider. Do not stop taking the antibiotic even if you start to feel better. ? Talk with your health care provider before you take a cough suppressant medicine.  Drink enough fluid to keep your urine clear or pale yellow.  If the air is dry, use a cold steam vaporizer or humidifier in your bedroom or your home to help loosen secretions.  Avoid anything that causes you to  cough at work or at home.  If your cough is worse at night, try sleeping in a semi-upright position.  Avoid cigarette smoke. If you smoke, quit smoking. If you need help quitting, ask your health care provider.  Avoid caffeine.  Avoid alcohol.  Rest as needed.  Contact a health care provider if:  You have new symptoms.  You cough up pus.  Your cough does not get better after 2-3 weeks, or your cough gets worse.  You cannot control your cough with suppressant medicines and you are losing sleep.  You develop pain that is getting worse or pain that is not controlled with pain medicines.  You have a fever.  You have unexplained weight loss.  You have night sweats. Get help right away if:  You cough up blood.  You have difficulty breathing.  Your heartbeat is very fast. This information is not intended to replace advice given to you by your health care provider. Make sure you discuss any questions you have with your health care provider. Document Released: 02/26/2011 Document Revised: 02/05/2016 Document Reviewed: 11/06/2014 Elsevier Interactive Patient Education  [employer].

## 2018-01-12 NOTE — Telephone Encounter (Signed)
-----   Message from Robert Bellow, MD sent at 01/12/2018  8:51 AM EDT ----- Is let the patient know that the urine test completed while he was in the nursing home does not suggest an active cancer adjacent to the colon.  I have forwarded the results to Dr. Gustavo Lah, but our plans at present will be for a repeat CT in September 2019.  Please put in recalls for an abdominal pelvic CT with contrast for September 2019 with an office visit to follow, diagnosis mesenteric mass.  Thank you

## 2018-01-12 NOTE — Progress Notes (Signed)
Pre visit review using our clinic review tool, if applicable. No additional management support is needed unless otherwise documented below in the visit note. 

## 2018-01-12 NOTE — Progress Notes (Signed)
No chief complaint on file.  ED f/u with wife  1. Had fall at home Tuesday when grabbed shower bar and detached from the wall went to ED had Xrays 01/11/18 left ankle and tib/fib no acute fracture s/p ORIF and left knee with small jt effusion  But c/o right knee pain as well and s/p right hip replacement 12/2017 Dr. Rudene Christians.  2. HTN wife reports elevated BP at home 170s-180s/80s-90s on current medications but today BP is low on norvasc 10, coreg 25 bid, hyralazine 100 tid, imdur 120 24 hr.  3. Wife c/o wt loss for pt as well as reduced appetite he has lost 8 lbs since last visit  4. Pt c/o sob esp with exertion and wife c/o pt being cold recently when the room I shot. He also has had a cough 5. Wife wants to know if intestines abnormal Nuc medicine scan needs bx will disc with Dr. Bary Castilla who ordered 5HIAA test but dont see results wife reports he did this at the RN home.   Review of Systems  Constitutional: Positive for chills and weight loss.  HENT: Positive for hearing loss.   Eyes: Negative for blurred vision.  Respiratory: Positive for cough and shortness of breath.   Cardiovascular: Negative for chest pain.  Gastrointestinal:       +reduced appetite   Musculoskeletal: Positive for falls and joint pain.  Skin: Negative for rash.  Neurological: Negative for headaches.  Psychiatric/Behavioral: Negative for depression.   Past Medical History:  Diagnosis Date  . Arthritis   . Atrial fibrillation ([place])   . CAD (coronary artery disease)    6 STENTS  . CHF (congestive heart failure) ([place])   . Chicken pox   . Colon polyp   . Corneal dystrophy    bilateral  . Crohn's disease ([place])   . Diabetes ([place]) 2002  . Dysrhythmia    A-FIB AND PAF  . GERD (gastroesophageal reflux disease)   . Gout   . History of kidney stones   . History of shingles   . Hyperlipemia   . Hypertension   . Kidney stones   . Myocardial infarction ([place])    x4, last one in 2010  . Peripheral neuropathy   .  Peripheral neuropathy   . Peripheral neuropathy   . PUD (peptic ulcer disease)   . Renal artery stenosis ([place])   . Renal artery stenosis ([place])   . Salzmann's nodular dystrophy 2012  . Sleep apnea   . Spinal stenosis    Past Surgical History:  Procedure Laterality Date  . ABLATION  2012  . APPLICATION VERTERBRAL DEFECT PROSTHETIC  05/01/2013  . ARTHRODESIS ANTERIOR LUMBAR SPINE  05/01/2013  . BACK SURGERY     lumbar fusion  . CARDIAC CATHETERIZATION    . CARDIAC ELECTROPHYSIOLOGY STUDY AND ABLATION    . CARDIAC SURGERY    . CARDIOVERSION    . CHOLECYSTECTOMY    . COLONOSCOPY WITH PROPOFOL N/A 04/09/2016   Completed for chronic diarrhea.  Few scattered diverticuli.  No mucosal lesions.  Surgeon: Lollie Sails, MD;  Location: [facility] ENDOSCOPY;  Service: Endoscopy;  Laterality: N/A;  . CORNEAL EYE SURGERY Bilateral 07/28/11    09/22/2011  . CORONARY ANGIOPLASTY    . CORONARY ANGIOPLASTY WITH STENT PLACEMENT  03/28/2015   Distal 80% to normal Stent, Dilation Balloon  . CORONARY ARTERY BYPASS GRAFT     4 VESSELS  . EYE SURGERY     bilateral cataract  . foot and  ankle repair Right 2005  . FRACTURE SURGERY     ANKLE PLATE AND SCREWS  . GALLBLADER    . JOINT REPLACEMENT     left total hip 11/11/15  . KNEE ARTHROSCOPY Right   . LUMBAR SPINE FUSION ONE LEVEL  05/03/2013  . RENAL ARTERY STENT Right   . ROTATOR CUFF REPAIR Right 2006  . TONSILLECTOMY    . TOTAL HIP ARTHROPLASTY Left 11/11/2015   Procedure: TOTAL HIP ARTHROPLASTY ANTERIOR APPROACH;  Surgeon: Hessie Knows, MD;  Location: [facility] ORS;  Service: Orthopedics;  Laterality: Left;  . TOTAL HIP ARTHROPLASTY Right 12/13/2017   Procedure: TOTAL HIP ARTHROPLASTY ANTERIOR APPROACH;  Surgeon: Hessie Knows, MD;  Location: [facility] ORS;  Service: Orthopedics;  Laterality: Right;  . TRANSCATH PLACEMENT INTRAVASCULAR STENT LEG  03/2015   Family History  Problem Relation Age of Onset  . CVA Father   . Stroke Father   . Lung cancer Mother    . Arthritis Mother   . Stomach cancer Sister   . Colon cancer Sister   . Diabetes Brother    Social History   Socioeconomic History  . Marital status: Married    Spouse name: Not on file  . Number of children: Not on file  . Years of education: Not on file  . Highest education level: Not on file  Occupational History  . Not on file  Social Needs  . Financial resource strain: Not on file  . Food insecurity:    Worry: Not on file    Inability: Not on file  . Transportation needs:    Medical: Not on file    Non-medical: Not on file  Tobacco Use  . Smoking status: Former Smoker    Packs/day: 1.00    Years: 3.00    Pack years: 3.00    Types: Cigarettes    Last attempt to quit: 06/30/1962    Years since quitting: 55.5  . Smokeless tobacco: Former Systems developer    Types: [place] date: 12/05/1968  Substance and Sexual Activity  . Alcohol use: No  . Drug use: No  . Sexual activity: Not on file  Lifestyle  . Physical activity:    Days per week: Not on file    Minutes per session: Not on file  . Stress: Not on file  Relationships  . Social connections:    Talks on phone: Not on file    Gets together: Not on file    Attends religious service: Not on file    Active member of club or organization: Not on file    Attends meetings of clubs or organizations: Not on file    Relationship status: Not on file  . Intimate partner violence:    Fear of current or ex partner: Not on file    Emotionally abused: Not on file    Physically abused: Not on file    Forced sexual activity: Not on file  Other Topics Concern  . Not on file  Social History Narrative   Married    No outpatient medications have been marked as taking for the 01/12/18 encounter (Appointment) with McLean-Scocuzza, Nino Glow, MD.   Allergies  Allergen Reactions  . Allopurinol Diarrhea, Other (See Comments) and Nausea And Vomiting    Other Reaction: GI Upset  . Atenolol Other (See Comments)    Other Reaction:  bradycardia  . Atorvastatin Other (See Comments)    Other Reaction: muscle aches  . Ramipril Rash  . Rosuvastatin Other (  See Comments)    Muscle aches  . Simvastatin Other (See Comments)    Other Reaction: MUSCLE ACHES (Zocor)  . Valsartan Other (See Comments)    Other Reaction: facial swelling  . Cardizem [Diltiazem] Rash   Recent Results (from the past 2160 hour(s))  INR/PT     Status: Abnormal   Collection Time: 10/18/17  2:52 PM  Result Value Ref Range   INR 1.9 (H) 0.8 - 1.0 ratio   Prothrombin Time 20.9 (H) 9.6 - 13.1 sec  INR/PT     Status: Abnormal   Collection Time: 10/27/17  2:29 PM  Result Value Ref Range   INR 2.1 (H) 0.8 - 1.0 ratio   Prothrombin Time 22.7 (H) 9.6 - 13.1 sec  POCT Glucose (CBG)     Status: Abnormal   Collection Time: 10/27/17  3:17 PM  Result Value Ref Range   POC Glucose 241 (A) 70 - 99 mg/dl  Bladder Scan (Post Void Residual) in office     Status: None   Collection Time: 11/08/17  1:46 PM  Result Value Ref Range   Scan Result 23   Protime-INR     Status: Abnormal   Collection Time: 11/10/17  7:48 AM  Result Value Ref Range   Prothrombin Time 23.0 (H) 11.4 - 15.2 seconds   INR 2.06     Comment: Performed at [facility], [address] [place], [place] [address]  APTT     Status: Abnormal   Collection Time: 11/10/17  7:48 AM  Result Value Ref Range   aPTT 37 (H) 24 - 36 seconds    Comment:        IF BASELINE aPTT IS ELEVATED, SUGGEST PATIENT RISK ASSESSMENT BE USED TO DETERMINE APPROPRIATE ANTICOAGULANT THERAPY. Performed at [facility], [address] [place], [place] [address]   CBC     Status: Abnormal   Collection Time: 11/16/17  9:05 AM  Result Value Ref Range   WBC 7.8 3.8 - 10.6 K/uL   RBC 4.01 (L) 4.40 - 5.90 MIL/uL   Hemoglobin 13.9 13.0 - 18.0 g/dL   HCT 40.4 40.0 - 52.0 %   MCV 100.5 (H) 80.0 - 100.0 fL   MCH 34.6 (H) 26.0 - 34.0 pg   MCHC 34.5 32.0 - 36.0 g/dL   RDW 14.0 11.5 - 14.5 %    Platelets 260 150 - 440 K/uL    Comment: Performed at [facility], [address] [place], [place] [address]  Sedimentation rate     Status: Abnormal   Collection Time: 11/16/17  9:05 AM  Result Value Ref Range   Sed Rate 49 (H) 0 - 20 mm/hr    Comment: Performed at [facility], [address] [place], [place] [address]  Basic metabolic panel     Status: Abnormal   Collection Time: 11/16/17  9:05 AM  Result Value Ref Range   Sodium 139 135 - 145 mmol/L   Potassium 3.8 3.5 - 5.1 mmol/L   Chloride 102 101 - 111 mmol/L   CO2 23 22 - 32 mmol/L   Glucose, Bld 175 (H) 65 - 99 mg/dL   BUN 16 6 - 20 mg/dL   Creatinine, Ser 0.91 0.61 - 1.24 mg/dL   Calcium 9.9 8.9 - 10.3 mg/dL   GFR calc non Af Amer >60 >60 mL/min   GFR calc Af Amer >60 >60 mL/min    Comment: (NOTE) The eGFR has been calculated using the CKD EPI  equation. This calculation has not been validated in all clinical situations. eGFR's persistently <60 mL/min signify possible Chronic Kidney Disease.    Anion gap 14 5 - 15    Comment: Performed at [facility], [address] [place], [place] [address]  APTT     Status: None   Collection Time: 11/16/17  9:05 AM  Result Value Ref Range   aPTT 33 24 - 36 seconds    Comment: Performed at [facility], [address] [place], [place] [address]  Protime-INR     Status: Abnormal   Collection Time: 11/16/17  9:05 AM  Result Value Ref Range   Prothrombin Time 17.2 (H) 11.4 - 15.2 seconds   INR 1.42     Comment: Performed at [facility], [address] [place], [place] [address]  Urinalysis, Routine w reflex microscopic     Status: Abnormal   Collection Time: 11/16/17  9:05 AM  Result Value Ref Range   Color, Urine AMBER (A) YELLOW    Comment: BIOCHEMICALS MAY BE AFFECTED BY COLOR   APPearance HAZY (A) CLEAR   Specific Gravity, Urine 1.023 1.005 - 1.030   pH 5.0 5.0 - 8.0   Glucose, UA NEGATIVE NEGATIVE mg/dL   Hgb urine  dipstick NEGATIVE NEGATIVE   Bilirubin Urine NEGATIVE NEGATIVE   Ketones, ur NEGATIVE NEGATIVE mg/dL   Protein, ur NEGATIVE NEGATIVE mg/dL   Nitrite NEGATIVE NEGATIVE   Leukocytes, UA NEGATIVE NEGATIVE    Comment: Performed at [facility], [address], [place], [place] [address]  Urine culture     Status: None   Collection Time: 11/16/17  9:05 AM  Result Value Ref Range   Specimen Description      URINE, RANDOM Performed at [facility], [address] [place], [place] [address]    Special Requests      NONE Performed at [facility], [address], [place], [place] [address]    Culture      NO GROWTH Performed at [facility], [place] [address], [place], [place] [address]    Report Status 11/17/2017 FINAL   Surgical pcr screen     Status: None   Collection Time: 11/16/17  9:05 AM  Result Value Ref Range   MRSA, PCR NEGATIVE NEGATIVE   Staphylococcus aureus NEGATIVE NEGATIVE    Comment: (NOTE) The Xpert SA Assay (FDA approved for NASAL specimens in patients 25 years of age and older), is one component of a comprehensive surveillance program. It is not intended to diagnose infection nor to guide or monitor treatment. Performed at [facility], [address] [place], [place] [address]   Type and screen [place]     Status: None   Collection Time: 11/16/17  9:05 AM  Result Value Ref Range   ABO/RH(D) A POS    Antibody Screen NEG    Sample Expiration 11/30/2017    Extend sample reason      NO TRANSFUSIONS OR PREGNANCY IN THE PAST 3 MONTHS Performed at [facility], [address] [place], [place] [address]   Glucose, capillary     Status: Abnormal   Collection Time: 11/30/17 10:23 AM  Result Value Ref Range   Glucose-Capillary 113 (H) 65 - 99 mg/dL  Glucose, capillary     Status: Abnormal   Collection Time: 12/13/17 12:00 PM  Result Value Ref Range   Glucose-Capillary 263 (H) 65 - 99  mg/dL  Type and  screen [place]     Status: None   Collection Time: 12/13/17 12:32 PM  Result Value Ref Range   ABO/RH(D) A POS    Antibody Screen NEG    Sample Expiration      12/16/2017 Performed at [facility], [address] [place], [place] [address]   Protime-INR     Status: None   Collection Time: 12/13/17 12:32 PM  Result Value Ref Range   Prothrombin Time 14.3 11.4 - 15.2 seconds   INR 1.12     Comment: Performed at [facility], [address] [place], [place] [address]  Surgical pathology     Status: None   Collection Time: 12/13/17  4:03 PM  Result Value Ref Range   SURGICAL PATHOLOGY      Surgical Pathology CASE: [id number] PATIENT: Chase Cabrera Surgical Pathology Report     SPECIMEN SUBMITTED: A. Femoral head, right  CLINICAL HISTORY: None provided  PRE-OPERATIVE DIAGNOSIS: Primary osteoarthritis of right hip  POST-OPERATIVE DIAGNOSIS: None provided.     DIAGNOSIS: A. RIGHT FEMORAL HEAD; ARTHROPLASTY: - OSTEOARTHRITIS.   GROSS DESCRIPTION:  A. Labeled: right femoral head  Size of specimen:      Head -4.5 x 4.6 cm      Neck -3.2 x 2.9 cm  Articular surface: yellow-tan with focal disruption  Cut surface: pink-tan  Other findings: received in formalin  Block summary: 1 - representative section(s), post decalcification    Final Diagnosis performed by Delorse Lek, MD.  Electronically signed 12/16/2017 1:21:33PM    The electronic signature indicates that the named Attending Pathologist has evaluated the specimen  Technical component performed at [facility], [address] [place], [place] [address] Lab: [phone] Dir: Sa Lenard Galloway, MD, MMM  Professional component performed at [facility], [address], [place], [place] [address] Lab: [phone] Dir: Dellia Nims. Rubinas, MD    Glucose, capillary     Status: Abnormal   Collection Time: 12/13/17   4:42 PM  Result Value Ref Range   Glucose-Capillary 168 (H) 65 - 99 mg/dL  Glucose, capillary     Status: Abnormal   Collection Time: 12/13/17  7:01 PM  Result Value Ref Range   Glucose-Capillary 254 (H) 65 - 99 mg/dL   Comment 1 Notify RN   Glucose, capillary     Status: Abnormal   Collection Time: 12/13/17  9:00 PM  Result Value Ref Range   Glucose-Capillary 223 (H) 65 - 99 mg/dL   Comment 1 Notify RN   Protime-INR     Status: None   Collection Time: 12/14/17  4:48 AM  Result Value Ref Range   Prothrombin Time 14.6 11.4 - 15.2 seconds   INR 1.15     Comment: Performed at [facility], [address] [place],  [address]  Glucose, capillary     Status: Abnormal   Collection Time: 12/14/17  7:57 AM  Result Value Ref Range   Glucose-Capillary 241 (H) 65 - 99 mg/dL  CBC     Status: Abnormal   Collection Time: 12/14/17 10:16 AM  Result Value Ref Range   WBC 8.8 3.8 - 10.6 K/uL   RBC 3.35 (L) 4.40 - 5.90 MIL/uL   Hemoglobin 11.5 (L) 13.0 - 18.0 g/dL   HCT 33.8 (L) 40.0 - 52.0 %   MCV 101.1 (H) 80.0 - 100.0 fL   MCH 34.2 (H) 26.0 - 34.0 pg   MCHC 33.9 32.0 - 36.0 g/dL   RDW  13.9 11.5 - 14.5 %   Platelets 222 150 - 440 K/uL    Comment: Performed at [facility], [address] [place], [place] [address]  Basic metabolic panel     Status: Abnormal   Collection Time: 12/14/17 11:08 AM  Result Value Ref Range   Sodium 133 (L) 135 - 145 mmol/L   Potassium 3.6 3.5 - 5.1 mmol/L   Chloride 100 (L) 101 - 111 mmol/L   CO2 24 22 - 32 mmol/L   Glucose, Bld 279 (H) 65 - 99 mg/dL   BUN 14 6 - 20 mg/dL   Creatinine, Ser 1.06 0.61 - 1.24 mg/dL   Calcium 8.3 (L) 8.9 - 10.3 mg/dL   GFR calc non Af Amer >60 >60 mL/min   GFR calc Af Amer >60 >60 mL/min    Comment: (NOTE) The eGFR has been calculated using the CKD EPI equation. This calculation has not been validated in all clinical situations. eGFR's persistently <60 mL/min signify possible Chronic  Kidney Disease.    Anion gap 9 5 - 15    Comment: Performed at [facility], [address] [place], [place] [address]  Glucose, capillary     Status: Abnormal   Collection Time: 12/14/17 11:57 AM  Result Value Ref Range   Glucose-Capillary 260 (H) 65 - 99 mg/dL  Glucose, capillary     Status: Abnormal   Collection Time: 12/14/17  4:14 PM  Result Value Ref Range   Glucose-Capillary 213 (H) 65 - 99 mg/dL   Comment 1 Notify RN   Glucose, capillary     Status: Abnormal   Collection Time: 12/14/17  6:01 PM  Result Value Ref Range   Glucose-Capillary 195 (H) 65 - 99 mg/dL  CULTURE, BLOOD (ROUTINE X 2) w Reflex to ID Panel     Status: None   Collection Time: 12/14/17  6:08 PM  Result Value Ref Range   Specimen Description BLOOD BLOOD RIGHT ARM    Special Requests      BOTTLES DRAWN AEROBIC AND ANAEROBIC Blood Culture adequate volume   Culture      NO GROWTH 5 DAYS Performed at [facility], [address] [place], [place] [address]    Report Status 12/19/2017 FINAL   Blood gas, venous     Status: Abnormal   Collection Time: 12/14/17  6:30 PM  Result Value Ref Range   pH, Ven 7.44 (H) 7.250 - 7.430   pCO2, Ven 39 (L) 44.0 - 60.0 mmHg   pO2, Ven 41.0 32.0 - 45.0 mmHg   Bicarbonate 26.5 20.0 - 28.0 mmol/L   Acid-Base Excess 2.2 (H) 0.0 - 2.0 mmol/L   O2 Saturation 78.4 %   Patient temperature 37.0    Collection site VEIN    Sample type VENOUS     Comment: Performed at [facility], [address] [place], [place] [address]  CBC     Status: Abnormal   Collection Time: 12/14/17  6:32 PM  Result Value Ref Range   WBC 8.6 3.8 - 10.6 K/uL   RBC 3.45 (L) 4.40 - 5.90 MIL/uL   Hemoglobin 11.7 (L) 13.0 - 18.0 g/dL   HCT 34.2 (L) 40.0 - 52.0 %   MCV 99.1 80.0 - 100.0 fL   MCH 33.9 26.0 - 34.0 pg   MCHC 34.2 32.0 - 36.0 g/dL   RDW 13.6 11.5 - 14.5 %   Platelets 204 150 - 440 K/uL    Comment: Performed at [facility], [address]  [place], [place] [address]  Basic metabolic panel     Status: Abnormal   Collection Time: 12/14/17  6:32 PM  Result Value Ref Range   Sodium 134 (L) 135 - 145 mmol/L   Potassium 4.0 3.5 - 5.1 mmol/L   Chloride 100 (L) 101 - 111 mmol/L   CO2 24 22 - 32 mmol/L   Glucose, Bld 224 (H) 65 - 99 mg/dL   BUN 13 6 - 20 mg/dL   Creatinine, Ser 0.93 0.61 - 1.24 mg/dL   Calcium 8.7 (L) 8.9 - 10.3 mg/dL   GFR calc non Af Amer >60 >60 mL/min   GFR calc Af Amer >60 >60 mL/min    Comment: (NOTE) The eGFR has been calculated using the CKD EPI equation. This calculation has not been validated in all clinical situations. eGFR's persistently <60 mL/min signify possible Chronic Kidney Disease.    Anion gap 10 5 - 15    Comment: Performed at [facility], [address] [place], [place] [address]  APTT     Status: None   Collection Time: 12/14/17  6:32 PM  Result Value Ref Range   aPTT 30 24 - 36 seconds    Comment: Performed at [facility], [address] [place], [place] [address]  Blood gas, arterial     Status: Abnormal   Collection Time: 12/14/17  7:52 PM  Result Value Ref Range   FIO2 0.36    Delivery systems NASAL CANNULA    pH, Arterial 7.45 7.350 - 7.450   pCO2 arterial 37 32.0 - 48.0 mmHg   pO2, Arterial 88 83.0 - 108.0 mmHg   Bicarbonate 26.9 20.0 - 28.0 mmol/L   Acid-Base Excess 3.2 (H) 0.0 - 2.0 mmol/L   O2 Saturation 96.7 %   Patient temperature 38.1    Collection site RIGHT RADIAL    Sample type ARTERIAL DRAW    Allens test (pass/fail) PASS PASS    Comment: Performed at [facility], [address] [place], [place] [address]  Urinalysis, Complete w Microscopic     Status: Abnormal   Collection Time: 12/14/17 10:12 PM  Result Value Ref Range   Color, Urine STRAW (A) YELLOW   APPearance CLEAR (A) CLEAR   Specific Gravity, Urine 1.006 1.005 - 1.030   pH 5.0 5.0 - 8.0   Glucose, UA NEGATIVE NEGATIVE mg/dL   Hgb urine dipstick NEGATIVE NEGATIVE    Bilirubin Urine NEGATIVE NEGATIVE   Ketones, ur NEGATIVE NEGATIVE mg/dL   Protein, ur NEGATIVE NEGATIVE mg/dL   Nitrite NEGATIVE NEGATIVE   Leukocytes, UA NEGATIVE NEGATIVE   RBC / HPF NONE SEEN 0 - 5 RBC/hpf   WBC, UA 0-5 0 - 5 WBC/hpf   Bacteria, UA NONE SEEN NONE SEEN   Squamous Epithelial / LPF NONE SEEN NONE SEEN    Comment: Performed at  [facility], [address] [place], [place] [address]  Urine Culture     Status: None   Collection Time: 12/14/17 10:12 PM  Result Value Ref Range   Specimen Description      URINE, RANDOM Performed at [facility], [address], [place], [place] [address]    Special Requests      NONE Performed at [facility], [address] [place], [place] [address]    Culture      NO GROWTH Performed at [facility], [place] [address] [place], [place] [address]  Report Status 12/16/2017 FINAL   Lactic acid, plasma     Status: None   Collection Time: 12/15/17 12:24 AM  Result Value Ref Range   Lactic Acid, Venous 1.2 0.5 - 1.9 mmol/L    Comment: Performed at [facility], [address] [place], [place] [address]  CULTURE, BLOOD (ROUTINE X 2) w Reflex to ID Panel     Status: None   Collection Time: 12/15/17 12:24 AM  Result Value Ref Range   Specimen Description BLOOD RIGHT HAND    Special Requests      BOTTLES DRAWN AEROBIC AND ANAEROBIC Blood Culture results may not be optimal due to an excessive volume of blood received in culture bottles   Culture      NO GROWTH 5 DAYS Performed at [facility], [address] [place], [place] [address]    Report Status 12/20/2017 FINAL   Procalcitonin - Baseline     Status: None   Collection Time: 12/15/17 12:24 AM  Result Value Ref Range   Procalcitonin 0.21 ng/mL    Comment:        Interpretation: PCT (Procalcitonin) <= 0.5 ng/mL: Systemic infection (sepsis) is not likely. Local bacterial infection is possible. (NOTE)       Sepsis PCT  Algorithm           Lower Respiratory Tract                                      Infection PCT Algorithm    ----------------------------     ----------------------------         PCT < 0.25 ng/mL                PCT < 0.10 ng/mL         Strongly encourage             Strongly discourage   discontinuation of antibiotics    initiation of antibiotics    ----------------------------     -----------------------------       PCT 0.25 - 0.50 ng/mL            PCT 0.10 - 0.25 ng/mL               OR       >80% decrease in PCT            Discourage initiation of                                            antibiotics      Encourage discontinuation           of antibiotics    ----------------------------     -----------------------------         PCT >= 0.50 ng/mL              PCT 0.26 - 0.50 ng/mL               AND        <80% decrease in PCT             Encourage initiation of  antibiotics       Encourage continuation           of antibiotics    ----------------------------     -----------------------------        PCT >= 0.50 ng/mL                  PCT > 0.50 ng/mL               AND         increase in PCT                  Strongly encourage                                      initiation of antibiotics    Strongly encourage escalation           of antibiotics                                     -----------------------------                                           PCT <= 0.25 ng/mL                                                 OR                                        > 80% decrease in PCT                                     Discontinue / Do not initiate                                             antibiotics Performed at [facility], [address] [place], [place] [address]   Protime-INR     Status: None   Collection Time: 12/15/17  7:28 AM  Result Value Ref Range   Prothrombin Time 13.6 11.4 - 15.2 seconds   INR 1.05      Comment: Performed at [facility], [address] [place],  [address]  Procalcitonin     Status: None   Collection Time: 12/15/17  7:28 AM  Result Value Ref Range   Procalcitonin 0.23 ng/mL    Comment:        Interpretation: PCT (Procalcitonin) <= 0.5 ng/mL: Systemic infection (sepsis) is not likely. Local bacterial infection is possible. (NOTE)       Sepsis PCT Algorithm           Lower Respiratory Tract  Infection PCT Algorithm    ----------------------------     ----------------------------         PCT < 0.25 ng/mL                PCT < 0.10 ng/mL         Strongly encourage             Strongly discourage   discontinuation of antibiotics    initiation of antibiotics    ----------------------------     -----------------------------       PCT 0.25 - 0.50 ng/mL            PCT 0.10 - 0.25 ng/mL               OR       >80% decrease in PCT            Discourage initiation of                                            antibiotics      Encourage discontinuation           of antibiotics    ----------------------------     -----------------------------         PCT >= 0.50 ng/mL              PCT 0.26 - 0.50 ng/mL               AND        <80% decrease in PCT             Encourage initiation of                                             antibiotics       Encourage continuation           of antibiotics    ----------------------------     -----------------------------        PCT >= 0.50 ng/mL                  PCT > 0.50 ng/mL               AND         increase in PCT                  Strongly encourage                                      initiation of antibiotics    Strongly encourage escalation           of antibiotics                                     -----------------------------                                           PCT <= 0.25 ng/mL  OR                                         > 80% decrease in PCT                                     Discontinue / Do not initiate                                             antibiotics Performed at [facility], [address] [place], [place] [address]   Glucose, capillary     Status: Abnormal   Collection Time: 12/15/17  8:22 AM  Result Value Ref Range   Glucose-Capillary 212 (H) 65 - 99 mg/dL  Glucose, capillary     Status: Abnormal   Collection Time: 12/15/17 11:41 AM  Result Value Ref Range   Glucose-Capillary 232 (H) 65 - 99 mg/dL  Glucose, capillary     Status: Abnormal   Collection Time: 12/15/17  5:43 PM  Result Value Ref Range   Glucose-Capillary 237 (H) 65 - 99 mg/dL  Glucose, capillary     Status: Abnormal   Collection Time: 12/15/17  9:38 PM  Result Value Ref Range   Glucose-Capillary 247 (H) 65 - 99 mg/dL   Comment 1 Notify RN   Protime-INR     Status: Abnormal   Collection Time: 12/16/17  4:40 AM  Result Value Ref Range   Prothrombin Time 15.3 (H) 11.4 - 15.2 seconds   INR 1.22     Comment: Performed at [facility], [address] [place], [place] [address]  Procalcitonin     Status: None   Collection Time: 12/16/17  4:40 AM  Result Value Ref Range   Procalcitonin 0.36 ng/mL    Comment:        Interpretation: PCT (Procalcitonin) <= 0.5 ng/mL: Systemic infection (sepsis) is not likely. Local bacterial infection is possible. (NOTE)       Sepsis PCT Algorithm           Lower Respiratory Tract                                      Infection PCT Algorithm    ----------------------------     ----------------------------         PCT < 0.25 ng/mL                PCT < 0.10 ng/mL         Strongly encourage             Strongly discourage   discontinuation of antibiotics    initiation of antibiotics    ----------------------------     -----------------------------       PCT 0.25 - 0.50 ng/mL            PCT 0.10 - 0.25 ng/mL               OR       >80% decrease in PCT             Discourage initiation of  antibiotics      Encourage discontinuation           of antibiotics    ----------------------------     -----------------------------         PCT >= 0.50 ng/mL              PCT 0.26 - 0.50 ng/mL               AND        <80% decrease in PCT             Encourage initiation of                                             antibiotics       Encourage continuation           of antibiotics    ----------------------------     -----------------------------        PCT >= 0.50 ng/mL                  PCT > 0.50 ng/mL               AND         increase in PCT                  Strongly encourage                                      initiation of antibiotics    Strongly encourage escalation           of antibiotics                                     -----------------------------                                           PCT <= 0.25 ng/mL                                                 OR                                        > 80% decrease in PCT                                     Discontinue / Do not initiate                                             antibiotics Performed at [facility], [address], [place], [place] [address]   CBC with Differential/Platelet     Status: Abnormal   Collection Time: 12/16/17  4:40 AM  Result Value Ref Range   WBC 10.4 3.8 - 10.6 K/uL   RBC 2.80 (L) 4.40 - 5.90 MIL/uL   Hemoglobin 9.6 (L) 13.0 - 18.0 g/dL   HCT 27.5 (L) 40.0 - 52.0 %   MCV 98.5 80.0 - 100.0 fL   MCH 34.2 (H) 26.0 - 34.0 pg   MCHC 34.7 32.0 - 36.0 g/dL   RDW 13.4 11.5 - 14.5 %   Platelets 190 150 - 440 K/uL   Neutrophils Relative % 73 %   Neutro Abs 7.6 (H) 1.4 - 6.5 K/uL   Lymphocytes Relative 10 %   Lymphs Abs 1.1 1.0 - 3.6 K/uL   Monocytes Relative 13 %   Monocytes Absolute 1.3 (H) 0.2 - 1.0 K/uL   Eosinophils Relative 4 %   Eosinophils Absolute 0.4 0 - 0.7 K/uL   Basophils Relative 0 %    Basophils Absolute 0.0 0 - 0.1 K/uL    Comment: Performed at [facility], [address] [place], [place] [address]  Basic metabolic panel     Status: Abnormal   Collection Time: 12/16/17  4:40 AM  Result Value Ref Range   Sodium 132 (L) 135 - 145 mmol/L   Potassium 3.5 3.5 - 5.1 mmol/L   Chloride 96 (L) 101 - 111 mmol/L   CO2 28 22 - 32 mmol/L   Glucose, Bld 208 (H) 65 - 99 mg/dL   BUN 15 6 - 20 mg/dL   Creatinine, Ser 0.96 0.61 - 1.24 mg/dL   Calcium 8.0 (L) 8.9 - 10.3 mg/dL   GFR calc non Af Amer >60 >60 mL/min   GFR calc Af Amer >60 >60 mL/min    Comment: (NOTE) The eGFR has been calculated using the CKD EPI equation. This calculation has not been validated in all clinical situations. eGFR's persistently <60 mL/min signify possible Chronic Kidney Disease.    Anion gap 8 5 - 15    Comment: Performed at [facility], [address] [place], [place] [address]  Glucose, capillary     Status: Abnormal   Collection Time: 12/16/17  7:45 AM  Result Value Ref Range   Glucose-Capillary 210 (H) 65 - 99 mg/dL   Comment 1 Notify RN   ECHOCARDIOGRAM COMPLETE     Status: None   Collection Time: 12/16/17  9:47 AM  Result Value Ref Range   Weight 3,326.3 oz   Height 68 in   BP 139/56 mmHg  Glucose, capillary     Status: Abnormal   Collection Time: 12/16/17 12:20 PM  Result Value Ref Range   Glucose-Capillary 225 (H) 65 - 99 mg/dL   Comment 1 Notify RN   Glucose, capillary     Status: Abnormal   Collection Time: 12/16/17  4:34 PM  Result Value Ref Range   Glucose-Capillary 194 (H) 65 - 99 mg/dL   Comment 1 Notify RN   Glucose, capillary     Status: Abnormal   Collection Time: 12/16/17  9:11 PM  Result Value Ref Range   Glucose-Capillary 182 (H) 65 - 99 mg/dL   Comment 1 Notify RN   Protime-INR     Status: Abnormal   Collection Time: 12/17/17  3:24 AM  Result Value Ref Range   Prothrombin Time 15.6 (H) 11.4 - 15.2 seconds   INR 1.25     Comment: Performed  at [facility], [address] [place], [place] [address]  Glucose, capillary     Status: Abnormal  Collection Time: 12/17/17  7:35 AM  Result Value Ref Range   Glucose-Capillary 186 (H) 65 - 99 mg/dL   Comment 1 Notify RN   Glucose, capillary     Status: Abnormal   Collection Time: 12/17/17 11:46 AM  Result Value Ref Range   Glucose-Capillary 232 (H) 65 - 99 mg/dL   Comment 1 Notify RN    Objective  There is no height or weight on file to calculate BMI. Wt Readings from Last 3 Encounters:  01/11/18 188 lb (85.3 kg)  12/15/17 207 lb 14.3 oz (94.3 kg)  11/22/17 196 lb (88.9 kg)   Temp Readings from Last 3 Encounters:  01/11/18 98.4 F (36.9 C) (Oral)  12/17/17 98.2 F (36.8 C) (Oral)  11/18/17 98 F (36.7 C) (Oral)   BP Readings from Last 3 Encounters:  01/11/18 (!) 185/69  12/17/17 (!) 126/51  11/22/17 126/68   Pulse Readings from Last 3 Encounters:  01/11/18 68  12/17/17 64  11/22/17 62    Physical Exam  Constitutional: He is oriented to person, place, and time. Vital signs are normal. He appears well-developed and well-nourished. He is cooperative.  HENT:  Head: Normocephalic and atraumatic.  Mouth/Throat: Oropharynx is clear and moist and mucous membranes are normal.  Eyes: Pupils are equal, round, and reactive to light. Conjunctivae are normal.  Cardiovascular: Normal rate. An irregular rhythm present.  Afib   Pulmonary/Chest: Effort normal. He has wheezes.  Musculoskeletal: He exhibits tenderness.       Right knee: Tenderness found. Medial joint line and lateral joint line tenderness noted.       Left knee: He exhibits effusion. Tenderness found. Medial joint line and lateral joint line tenderness noted.  Neurological: He is alert and oriented to person, place, and time.  In wheelchair today   Skin: Skin is warm, dry and intact.  Psychiatric: He has a normal mood and affect. His speech is normal and behavior is normal. Judgment and thought  content normal. Cognition and memory are normal.  Nursing note and vitals reviewed.   Assessment   1. Fall 01/10/18 with c/o b/l knee pain and swelling and left ankle swelling  2. HTN with hypotension today  3. Weight loss and reduced appetite  4. SOB and cough r/o infection I.e pna. He also has  OSA and not using bipap per wife  5. H/o abnormal CT entero ab/pelvis scan on instestine with spiculated peritoneal mass with 1 cm pericolonic lymph nodes PET scan with mild hypermetabolism ddx carcinoid consider repeat CT in 06/02/18  Will disc with Dr. Bary Castilla  6. Afib, chronic on coumadin  7. HM  Plan  1.  Xray right knee try to get in sooner with Dr. Rudene Christians appt in 2 weeks  Prn Tylenol  Knee brace b/l Rx  2. Advised wife to cont all meds except reduce norvasc to 5 mg qhs and if elevated take 1/2-5 mg in am as well current dose 10 mg qd, cont coreg 25 bid, hydralazine 100 mg tid   3. Encouraged po  Premier protein shakes  4. Doxy CXR today + interstitial lung disease h/o lung nodules do CT chest with contrast  5. Will send Dr. Bary Castilla message to see if intestinal lesion needs bx and also that cant see 5HIAA test wife reports pt did at nursing home  6. INR check today  Currently on Coumadin 3 mg on Sun, Monday and 4 mg other days  7.  Had flushot pna 23 had 02/2014  UTD prevnar  Disc Tdap and shingrix in future   PSAhad nl 10/03/17 0.5 consider DRE in future [facility] crohns on mesalamine 2.4 mg qd-negative colonoscopy with multiple bxs  Consider repeat DEXA reviewed DEXA +osteoporosis noted 06/07/04  Ordered repeat CT chest h/o pulm nodules on CXR and CT chest10/11/16and former smoker  Derm [place] dermatologist 2019 [facility] c/w left temple and right hand Aks. H/o skin cancer   Of note previously reviewed [facility] visitpt not taking Flomax, Detrol, Toviaz 4 mg qd, Pravastatin Consider check lipid fasting in futureunable to tolerate pravastatin consider zetia in  future  check labs today CMET, CBC, INR, A1C, vitamin D. Need to do lipid and urine protein in future    Provider: Dr. Olivia Mackie [facility]

## 2018-01-12 NOTE — Progress Notes (Signed)
Thank you :)

## 2018-01-12 NOTE — Progress Notes (Signed)
24-hour urine collection completed at [facility] for evaluation of a possible functional carcinoid tumor was completed on January 04, 2018.  The results showed a normal value of 5-HIAA at 1.5 mg/g of creatinine, with a reference range of less than 10 mg/g of creatinine.  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  In light of this finding and the patient's recovery from recent hip surgery, we will plan on having a follow-up CT of the abdomen and pelvis based bar in September 2019.

## 2018-01-12 NOTE — Telephone Encounter (Signed)
I talked with the wife(he was still in bed sleeping) she states he fell yesterday and was at the ED last night. She states she is concerned that he is having shortness of breath, weight loss and elevated blood pressure. She states he is having more trouble walking as well. Dr Bary Castilla aware and wife states that he has an appointment with his primary care Dr Aundra Dubin and she will discuss with her the concerns. Appreciates call. Placed in recalls

## 2018-01-13 ENCOUNTER — Telehealth: Payer: Self-pay

## 2018-01-13 ENCOUNTER — Other Ambulatory Visit: Payer: Self-pay | Admitting: Internal Medicine

## 2018-01-13 DIAGNOSIS — E1140 Type 2 diabetes mellitus with diabetic neuropathy, unspecified: Secondary | ICD-10-CM | POA: Diagnosis not present

## 2018-01-13 DIAGNOSIS — Z471 Aftercare following joint replacement surgery: Secondary | ICD-10-CM | POA: Diagnosis not present

## 2018-01-13 DIAGNOSIS — E1122 Type 2 diabetes mellitus with diabetic chronic kidney disease: Secondary | ICD-10-CM | POA: Diagnosis not present

## 2018-01-13 DIAGNOSIS — N182 Chronic kidney disease, stage 2 (mild): Secondary | ICD-10-CM | POA: Diagnosis not present

## 2018-01-13 DIAGNOSIS — R05 Cough: Secondary | ICD-10-CM

## 2018-01-13 DIAGNOSIS — I480 Paroxysmal atrial fibrillation: Secondary | ICD-10-CM | POA: Diagnosis not present

## 2018-01-13 DIAGNOSIS — R0602 Shortness of breath: Secondary | ICD-10-CM

## 2018-01-13 DIAGNOSIS — R918 Other nonspecific abnormal finding of lung field: Secondary | ICD-10-CM

## 2018-01-13 DIAGNOSIS — I129 Hypertensive chronic kidney disease with stage 1 through stage 4 chronic kidney disease, or unspecified chronic kidney disease: Secondary | ICD-10-CM | POA: Diagnosis not present

## 2018-01-13 DIAGNOSIS — R059 Cough, unspecified: Secondary | ICD-10-CM

## 2018-01-13 LAB — VITAMIN D 25 HYDROXY (VIT D DEFICIENCY, FRACTURES): VIT D 25 HYDROXY: 37.8 ng/mL (ref 30.0–100.0)

## 2018-01-13 LAB — HEMOGLOBIN A1C
Hgb A1c MFr Bld: 5.8 % — ABNORMAL HIGH (ref 4.8–5.6)
MEAN PLASMA GLUCOSE: 119.76 mg/dL

## 2018-01-13 NOTE — Progress Notes (Signed)
Per [facility]-his post op is on 5/15. They are sending a message to their PA to see if they can work him in sooner than the 15th. Dr. Rudene Christians is out of the office today.

## 2018-01-13 NOTE — Telephone Encounter (Signed)
Yes as long as the slot is 64mn

## 2018-01-13 NOTE — Telephone Encounter (Signed)
Patient has been scheduled to see Dr. Aundra Dubin on 01-20-18 at 3:00.

## 2018-01-13 NOTE — Telephone Encounter (Signed)
Copied from [place] [phone] [id number]. Topic: Appointment Scheduling - Scheduling Inquiry for Clinic >> Jan 13, 2018  8:39 AM Lennox Solders wrote: Reason for CRM:pt saw [facility] yesterday and needs 1 wk rov. Dr Karlyn Agee does not have avail ov slot just hosp fup and same day slots

## 2018-01-16 ENCOUNTER — Encounter: Payer: Self-pay | Admitting: Internal Medicine

## 2018-01-16 MED ORDER — WARFARIN SODIUM 4 MG PO TABS: 4.0000 mg | ORAL_TABLET | ORAL | 2 refills | Status: DC

## 2018-01-16 MED ORDER — WARFARIN SODIUM 3 MG PO TABS: ORAL_TABLET | ORAL | 2 refills | Status: DC

## 2018-01-16 MED ORDER — GABAPENTIN 300 MG PO CAPS: ORAL_CAPSULE | ORAL | 1 refills | Status: DC

## 2018-01-17 DIAGNOSIS — I129 Hypertensive chronic kidney disease with stage 1 through stage 4 chronic kidney disease, or unspecified chronic kidney disease: Secondary | ICD-10-CM | POA: Diagnosis not present

## 2018-01-17 DIAGNOSIS — N182 Chronic kidney disease, stage 2 (mild): Secondary | ICD-10-CM | POA: Diagnosis not present

## 2018-01-17 DIAGNOSIS — E1122 Type 2 diabetes mellitus with diabetic chronic kidney disease: Secondary | ICD-10-CM | POA: Diagnosis not present

## 2018-01-17 DIAGNOSIS — Z471 Aftercare following joint replacement surgery: Secondary | ICD-10-CM | POA: Diagnosis not present

## 2018-01-17 DIAGNOSIS — E1140 Type 2 diabetes mellitus with diabetic neuropathy, unspecified: Secondary | ICD-10-CM | POA: Diagnosis not present

## 2018-01-17 DIAGNOSIS — I480 Paroxysmal atrial fibrillation: Secondary | ICD-10-CM | POA: Diagnosis not present

## 2018-01-17 DIAGNOSIS — W182XXA Fall in (into) shower or empty bathtub, initial encounter: Secondary | ICD-10-CM | POA: Diagnosis not present

## 2018-01-17 DIAGNOSIS — M25551 Pain in right hip: Secondary | ICD-10-CM | POA: Diagnosis not present

## 2018-01-17 DIAGNOSIS — M1711 Unilateral primary osteoarthritis, right knee: Secondary | ICD-10-CM | POA: Diagnosis not present

## 2018-01-18 ENCOUNTER — Telehealth: Payer: Self-pay | Admitting: Internal Medicine

## 2018-01-18 DIAGNOSIS — Z471 Aftercare following joint replacement surgery: Secondary | ICD-10-CM | POA: Diagnosis not present

## 2018-01-18 DIAGNOSIS — E1140 Type 2 diabetes mellitus with diabetic neuropathy, unspecified: Secondary | ICD-10-CM | POA: Diagnosis not present

## 2018-01-18 DIAGNOSIS — I480 Paroxysmal atrial fibrillation: Secondary | ICD-10-CM | POA: Diagnosis not present

## 2018-01-18 DIAGNOSIS — E1122 Type 2 diabetes mellitus with diabetic chronic kidney disease: Secondary | ICD-10-CM | POA: Diagnosis not present

## 2018-01-18 DIAGNOSIS — N182 Chronic kidney disease, stage 2 (mild): Secondary | ICD-10-CM | POA: Diagnosis not present

## 2018-01-18 DIAGNOSIS — I129 Hypertensive chronic kidney disease with stage 1 through stage 4 chronic kidney disease, or unspecified chronic kidney disease: Secondary | ICD-10-CM | POA: Diagnosis not present

## 2018-01-18 NOTE — Telephone Encounter (Signed)
Fransisco Beau please call wife about labs  Previous message sent with how to dose Coumadin after INR low   Also advise wife Dr. Bary Castilla surgery said  5 HIAA 24 hour urine collection was normal.   While biopsy might be considered for this incidentaloma/abnormal previously imaging scan, in discussion with GI and radiology there is not a strong suspicion for malignancy.   He would still recommend f/u CT as planned in September.   MTS

## 2018-01-19 ENCOUNTER — Other Ambulatory Visit: Payer: Self-pay | Admitting: Internal Medicine

## 2018-01-19 DIAGNOSIS — Z7901 Long term (current) use of anticoagulants: Secondary | ICD-10-CM

## 2018-01-19 DIAGNOSIS — I482 Chronic atrial fibrillation, unspecified: Secondary | ICD-10-CM

## 2018-01-19 MED ORDER — WARFARIN SODIUM 4 MG PO TABS: 4.0000 mg | ORAL_TABLET | Freq: Every day | ORAL | 3 refills | Status: DC

## 2018-01-19 MED ORDER — LEVEMIR FLEXTOUCH 100 UNIT/ML ~~LOC~~ SOPN
80.0000 [IU] | PEN_INJECTOR | Freq: Every day | SUBCUTANEOUS | 1 refills | Status: DC
Start: 2018-01-19 — End: 2018-05-02

## 2018-01-20 ENCOUNTER — Ambulatory Visit (INDEPENDENT_AMBULATORY_CARE_PROVIDER_SITE_OTHER): Payer: Medicare Other | Admitting: Internal Medicine

## 2018-01-20 ENCOUNTER — Encounter: Payer: Self-pay | Admitting: Internal Medicine

## 2018-01-20 VITALS — BP 128/56 | HR 65 | Temp 97.4°F | Ht 68.0 in | Wt 187.0 lb

## 2018-01-20 DIAGNOSIS — E1142 Type 2 diabetes mellitus with diabetic polyneuropathy: Secondary | ICD-10-CM | POA: Diagnosis not present

## 2018-01-20 DIAGNOSIS — I10 Essential (primary) hypertension: Secondary | ICD-10-CM | POA: Diagnosis not present

## 2018-01-20 DIAGNOSIS — E1122 Type 2 diabetes mellitus with diabetic chronic kidney disease: Secondary | ICD-10-CM | POA: Diagnosis not present

## 2018-01-20 DIAGNOSIS — Z5181 Encounter for therapeutic drug level monitoring: Secondary | ICD-10-CM | POA: Diagnosis not present

## 2018-01-20 DIAGNOSIS — E1140 Type 2 diabetes mellitus with diabetic neuropathy, unspecified: Secondary | ICD-10-CM | POA: Diagnosis not present

## 2018-01-20 DIAGNOSIS — K6389 Other specified diseases of intestine: Secondary | ICD-10-CM

## 2018-01-20 DIAGNOSIS — Z471 Aftercare following joint replacement surgery: Secondary | ICD-10-CM | POA: Diagnosis not present

## 2018-01-20 DIAGNOSIS — K639 Disease of intestine, unspecified: Secondary | ICD-10-CM | POA: Diagnosis not present

## 2018-01-20 DIAGNOSIS — R6883 Chills (without fever): Secondary | ICD-10-CM

## 2018-01-20 DIAGNOSIS — Z789 Other specified health status: Secondary | ICD-10-CM | POA: Diagnosis not present

## 2018-01-20 DIAGNOSIS — Z7901 Long term (current) use of anticoagulants: Secondary | ICD-10-CM | POA: Diagnosis not present

## 2018-01-20 DIAGNOSIS — I129 Hypertensive chronic kidney disease with stage 1 through stage 4 chronic kidney disease, or unspecified chronic kidney disease: Secondary | ICD-10-CM | POA: Diagnosis not present

## 2018-01-20 DIAGNOSIS — I4891 Unspecified atrial fibrillation: Secondary | ICD-10-CM

## 2018-01-20 DIAGNOSIS — I480 Paroxysmal atrial fibrillation: Secondary | ICD-10-CM | POA: Diagnosis not present

## 2018-01-20 DIAGNOSIS — G4733 Obstructive sleep apnea (adult) (pediatric): Secondary | ICD-10-CM | POA: Diagnosis not present

## 2018-01-20 DIAGNOSIS — E876 Hypokalemia: Secondary | ICD-10-CM | POA: Insufficient documentation

## 2018-01-20 DIAGNOSIS — N182 Chronic kidney disease, stage 2 (mild): Secondary | ICD-10-CM | POA: Diagnosis not present

## 2018-01-20 NOTE — Patient Instructions (Addendum)
Ask Dr. Lake Bells about cpap/bipap You will need CT abdomen/pelvis by 06/02/18 please call GI or surgery to schedule     Vitamin K Foods and Warfarin Warfarin is a blood thinner (anticoagulant). Anticoagulant medicines help prevent the formation of blood clots. These medicines work by decreasing the activity of vitamin K, which promotes normal blood clotting. When you take warfarin, problems can occur from suddenly increasing or decreasing the amount of vitamin K that you eat from one day to the next. Problems may include:  Blood clots.  Bleeding.  What general guidelines do I need to follow? To avoid problems when taking warfarin:  Eat a balanced diet that includes: ? Fresh fruits and vegetables. ? Whole grains. ? Low-fat dairy products. ? Lean proteins, such as fish, eggs, and lean cuts of meat.  Keep your intake of vitamin K consistent from day to day. To do this: ? Avoid eating large amounts of vitamin K one day and low amounts of vitamin K the next day. ? If you take a multivitamin that contains vitamin K, be sure to take it every day. ? Know which foods contain vitamin K. Use the lists below to understand serving sizes and the amount of vitamin K in one serving.  Avoid major changes in your diet. If you are going to change your diet, talk with your health care provider before making changes.  Work with a Financial planner (dietitian) to develop a meal plan that works best for you.  High vitamin K foods Foods that are high in vitamin K contain more than 100 mcg (micrograms) per serving. These include:  Broccoli (cooked) -  cup has 110 mcg.  Brussels sprouts (cooked) -  cup has 109 mcg.  Greens, beet (cooked) -  cup has 350 mcg.  Greens, collard (cooked) -  cup has 418 mcg.  Greens, turnip (cooked) -  cup has 265 mcg.  Green onions or scallions -  cup has 105 mcg.  Kale (fresh or frozen) -  cup has 531 mcg.  Parsley (raw) - 10 sprigs has 164 mcg.  Spinach  (cooked) -  cup has 444 mcg.  Swiss chard (cooked) -  cup has 287 mcg.  Moderate vitamin K foods Foods that have a moderate amount of vitamin K contain 25-100 mcg per serving. These include:  Asparagus (cooked) - 5 spears have 38 mcg.  Black-eyed peas (dried) -  cup has 32 mcg.  Cabbage (cooked) -  cup has 37 mcg.  Kiwi fruit - 1 medium has 31 mcg.  Lettuce - 1 cup has 57-63 mcg.  Okra (frozen) -  cup has 44 mcg.  Prunes (dried) - 5 prunes have 25 mcg.  Watercress (raw) - 1 cup has 85 mcg.  Low vitamin K foods Foods low in vitamin K contain less than 25 mcg per serving. These include:  Artichoke - 1 medium has 18 mcg.  Avocado - 1 oz. has 6 mcg.  Blueberries -  cup has 14 mcg.  Cabbage (raw) -  cup has 21 mcg.  Carrots (cooked) -  cup has 11 mcg.  Cauliflower (raw) -  cup has 11 mcg.  Cucumber with peel (raw) -  cup has 9 mcg.  Grapes -  cup has 12 mcg.  Mango - 1 medium has 9 mcg.  Nuts - 1 oz. has 15 mcg.  Pear - 1 medium has 8 mcg.  Peas (cooked) -  cup has 19 mcg.  Pickles - 1 spear has 14 mcg.  Pumpkin seeds - 1 oz. has 13 mcg.  Sauerkraut (canned) -  cup has 16 mcg.  Soybeans (cooked) -  cup has 16 mcg.  Tomato (raw) - 1 medium has 10 mcg.  Tomato sauce -  cup has 17 mcg.  Vitamin K-free foods If a food contain less than 5 mcg per serving, it is considered to have no vitamin K. These foods include:  Bread and cereal products.  Cheese.  Eggs.  Fish and shellfish.  Meat and poultry.  Milk and dairy products.  Sunflower seeds.  Actual amounts of vitamin K in foods may be different depending on processing. Talk with your dietitian about what foods you can eat and what foods you should avoid. This information is not intended to replace advice given to you by your health care provider. Make sure you discuss any questions you have with your health care provider. Document Released: 06/27/2009 Document Revised: 03/21/2016  Document Reviewed: 12/03/2015 Elsevier Interactive Patient Education  [employer].

## 2018-01-20 NOTE — Progress Notes (Signed)
Chief Complaint  Patient presents with  . Follow-up    chills    F/u with wife  1. Still c/o chills reviewed blood cultures and urine culture negative 4/3 and 12/15/17. He has not had fever but chills since surgery (ortho) 12/2017   2. Wife reports he is falling asleep during the day which he is supposed to be on bipap for OSA but does not use appt upcoming with pulmonary Dr. [place] Bells.  Will also ask pulm to review CT chest which is pending for 01/23/18 CXR abnormal and will further w/u with CT chest  Wife is concerned that O2 sat is 93% today and not 100% explained to her >88 % ok  3 DM 2 last A1C 5.8 with hypoglycemia was on levemir 90 units with humalog advised pt to reduce levemir to 80 units qd   4. Reviewed abnormal CT ab/pelvis with Dr. Bary Castilla who rec repeat imaging by 05/2018 pt to f/u gen surgery or GI for this and 5HIAA test urine 24 hr was normal. For now gen surgery does not want to biospy lesion in the gut noted on CT ab/pelvis and will do repeat imaging in 6 months   5. H/o Afib on Coumadin with subtherapeutic INR on Coumadin 4 mg qd now given food list to review with vitamin K containing foots and potassium containing foods due to low K 3.4   Of note pt is now home from [facility]  Review of Systems  Constitutional: Positive for chills. Negative for fever and weight loss.  HENT: Negative for hearing loss.   Eyes: Negative for blurred vision.  Respiratory: Negative for shortness of breath.   Cardiovascular: Negative for chest pain.  Gastrointestinal: Negative for abdominal pain.  Musculoskeletal: Negative for falls.  Skin: Negative for rash.  Neurological: Negative for headaches.       +daytime sleepiness   Psychiatric/Behavioral: Negative for depression.   Past Medical History:  Diagnosis Date  . Arthritis   . Atrial fibrillation ([place])   . CAD (coronary artery disease)    6 STENTS  . CHF (congestive heart failure) ([place])   . Chicken pox   . Colon polyp   .  Corneal dystrophy    bilateral  . Crohn's disease ([place])   . Diabetes ([place]) 2002  . Dysrhythmia    A-FIB AND PAF  . GERD (gastroesophageal reflux disease)   . Gout   . History of kidney stones   . History of shingles   . Hyperlipemia   . Hypertension   . Kidney stones   . Myocardial infarction ([place])    x4, last one in 2010  . Peripheral neuropathy   . Peripheral neuropathy   . Peripheral neuropathy   . PUD (peptic ulcer disease)   . Renal artery stenosis ([place])   . Renal artery stenosis ([place])   . Salzmann's nodular dystrophy 2012  . Sleep apnea   . Spinal stenosis    Past Surgical History:  Procedure Laterality Date  . ABLATION  2012  . APPLICATION VERTERBRAL DEFECT PROSTHETIC  05/01/2013  . ARTHRODESIS ANTERIOR LUMBAR SPINE  05/01/2013  . BACK SURGERY     lumbar fusion  . CARDIAC CATHETERIZATION    . CARDIAC ELECTROPHYSIOLOGY STUDY AND ABLATION    . CARDIAC SURGERY    . CARDIOVERSION    . CHOLECYSTECTOMY    . COLONOSCOPY WITH PROPOFOL N/A 04/09/2016   Completed for chronic diarrhea.  Few scattered diverticuli.  No mucosal lesions.  Surgeon: Billie Ruddy  Gustavo Lah, MD;  Location: [facility];  Service: Endoscopy;  Laterality: N/A;  . CORNEAL EYE SURGERY Bilateral 07/28/11    09/22/2011  . CORONARY ANGIOPLASTY    . CORONARY ANGIOPLASTY WITH STENT PLACEMENT  03/28/2015   Distal 80% to normal Stent, Dilation Balloon  . CORONARY ARTERY BYPASS GRAFT     4 VESSELS  . EYE SURGERY     bilateral cataract  . foot and ankle repair Right 2005  . FRACTURE SURGERY     ANKLE PLATE AND SCREWS  . GALLBLADER    . JOINT REPLACEMENT     left total hip 11/11/15  . JOINT REPLACEMENT     right total hip 12/2017 Dr. Rudene Christians   . KNEE ARTHROSCOPY Right   . LUMBAR SPINE FUSION ONE LEVEL  05/03/2013  . RENAL ARTERY STENT Right   . ROTATOR CUFF REPAIR Right 2006  . TONSILLECTOMY    . TOTAL HIP ARTHROPLASTY Left 11/11/2015   Procedure: TOTAL HIP ARTHROPLASTY ANTERIOR APPROACH;  Surgeon: Hessie Knows, MD;  Location: [facility] ORS;  Service: Orthopedics;  Laterality: Left;  . TOTAL HIP ARTHROPLASTY Right 12/13/2017   Procedure: TOTAL HIP ARTHROPLASTY ANTERIOR APPROACH;  Surgeon: Hessie Knows, MD;  Location: [facility] ORS;  Service: Orthopedics;  Laterality: Right;  . TRANSCATH PLACEMENT INTRAVASCULAR STENT LEG  03/2015   Family History  Problem Relation Age of Onset  . CVA Father   . Stroke Father   . Lung cancer Mother   . Arthritis Mother   . Stomach cancer Sister   . Colon cancer Sister   . Diabetes Brother    Social History   Socioeconomic History  . Marital status: Married    Spouse name: Not on file  . Number of children: Not on file  . Years of education: Not on file  . Highest education level: Not on file  Occupational History  . Not on file  Social Needs  . Financial resource strain: Not on file  . Food insecurity:    Worry: Not on file    Inability: Not on file  . Transportation needs:    Medical: Not on file    Non-medical: Not on file  Tobacco Use  . Smoking status: Former Smoker    Packs/day: 1.00    Years: 3.00    Pack years: 3.00    Types: Cigarettes    Last attempt to quit: 06/30/1962    Years since quitting: 55.5  . Smokeless tobacco: Former Systems developer    Types: [place] date: 12/05/1968  Substance and Sexual Activity  . Alcohol use: No  . Drug use: No  . Sexual activity: Not on file  Lifestyle  . Physical activity:    Days per week: Not on file    Minutes per session: Not on file  . Stress: Not on file  Relationships  . Social connections:    Talks on phone: Not on file    Gets together: Not on file    Attends religious service: Not on file    Active member of club or organization: Not on file    Attends meetings of clubs or organizations: Not on file    Relationship status: Not on file  . Intimate partner violence:    Fear of current or ex partner: Not on file    Emotionally abused: Not on file    Physically abused: Not on file    Forced  sexual activity: Not on file  Other Topics Concern  .  Not on file  Social History Narrative   Married    Current Meds  Medication Sig  . amLODipine (NORVASC) 10 MG tablet TAKE 1 TABLET BY MOUTH ONCE DAILY (Patient taking differently: Take 10 mg by mouth at bedtime)  . azelastine (ASTELIN) 0.1 % nasal spray Place 2 sprays into both nostrils 2 (two) times daily. Use in each nostril as directed (Patient taking differently: Place 2 sprays into both nostrils daily. Use in each nostril as directed)  . carvedilol (COREG) 25 MG tablet Take 1 tablet (25 mg total) by mouth 2 (two) times daily with a meal.  . cetirizine (ZYRTEC ALLERGY) 10 MG tablet Take 10 mg by mouth daily.  . Cholecalciferol 50000 units TABS Take 1 tablet by mouth once a week. (Patient taking differently: Take 50,000 Units by mouth every Saturday. )  . clopidogrel (PLAVIX) 75 MG tablet Take 75 mg by mouth daily.  . colchicine 0.6 MG tablet TAKE 1 TABLET BY MOUTH ONCE DAILY (Patient taking differently: TAKE 0.6 MG BY MOUTH ONCE DAILY)  . diclofenac sodium (VOLTAREN) 1 % GEL Apply 2 g topically 4 (four) times daily as needed (for pain).   Marland Kitchen dicyclomine (BENTYL) 10 MG capsule Take 10 mg by mouth 3 (three) times daily before meals.   . gabapentin (NEURONTIN) 300 MG capsule take 2 capsules (615m) by mouth three times a day  . hydrALAZINE (APRESOLINE) 100 MG tablet TAKE 100 MG BY MOUTH THREE TIMES A DAY  . HYDROcodone-acetaminophen (NORCO) 7.5-325 MG tablet Take 1-2 tablets by mouth every 4 (four) hours as needed for severe pain (pain score 7-10).  . hydrocortisone 2.5 % cream Apply topically 2 (two) times daily. (Patient taking differently: Apply 1 application topically daily as needed (rash behind ear). )  . insulin lispro (HUMALOG) 100 UNIT/ML KiwkPen Inject 15-30 units SQ up to three times daily per sliding scale  . isosorbide mononitrate (IMDUR) 120 MG 24 hr tablet TAKE 1 TABLET BY MOUTH EVERY DAY (Patient taking differently: TAKE 120  MG BY MOUTH EVERY DAY)  . LEVEMIR FLEXTOUCH 100 UNIT/ML Pen Inject 80 Units into the skin daily at 10 pm.  . mesalamine (LIALDA) 1.2 g EC tablet Take 2.4 g by mouth daily with breakfast. [facility]  . metFORMIN (GLUCOPHAGE) 1000 MG tablet Take 1,000 mg by mouth 2 (two) times daily.   . Multiple Vitamin (MULTI-VITAMINS) TABS Take 1 tablet by mouth daily.  . nitroGLYCERIN (NITROSTAT) 0.4 MG SL tablet Place 1 tablet (0.4 mg total) under the tongue every 5 (five) minutes x 3 doses as needed. For chest pain.  Call 911 if no relief after 3 tablets  . omeprazole (PRILOSEC) 40 MG capsule Take 40 mg by mouth 2 (two) times daily.   . pravastatin (PRAVACHOL) 40 MG tablet TAKE 1 TABLET BY MOUTH ONCE DAILY (Patient taking differently: TAKE 40 MG BY MOUTH ONCE DAILY)  . probenecid (BENEMID) 500 MG tablet Take 500 mg by mouth 2 (two) times daily.   . vitamin C (ASCORBIC ACID) 500 MG tablet Take 500 mg by mouth daily.   . Vitamin E 400 units TABS Take 400 Units by mouth 2 (two) times daily.  .Marland Kitchenwarfarin (COUMADIN) 4 MG tablet Take 1 tablet (4 mg total) by mouth daily at 6 PM. Take 4 mg by mouth daily  . [DISCONTINUED] doxycycline (VIBRA-TABS) 100 MG tablet Take 1 tablet (100 mg total) by mouth 2 (two) times daily. With food   Allergies  Allergen Reactions  . Allopurinol Diarrhea, Other (  See Comments) and Nausea And Vomiting    Other Reaction: GI Upset  . Atenolol Other (See Comments)    Other Reaction: bradycardia  . Atorvastatin Other (See Comments)    Other Reaction: muscle aches  . Ramipril Rash  . Rosuvastatin Other (See Comments)    Muscle aches  . Simvastatin Other (See Comments)    Other Reaction: MUSCLE ACHES (Zocor)  . Valsartan Other (See Comments)    Other Reaction: facial swelling  . Cardizem [Diltiazem] Rash   Recent Results (from the past 2160 hour(s))  INR/PT     Status: Abnormal   Collection Time: 10/27/17  2:29 PM  Result Value Ref Range   INR 2.1 (H) 0.8 - 1.0 ratio   Prothrombin Time  22.7 (H) 9.6 - 13.1 sec  POCT Glucose (CBG)     Status: Abnormal   Collection Time: 10/27/17  3:17 PM  Result Value Ref Range   POC Glucose 241 (A) 70 - 99 mg/dl  Bladder Scan (Post Void Residual) in office     Status: None   Collection Time: 11/08/17  1:46 PM  Result Value Ref Range   Scan Result 23   Protime-INR     Status: Abnormal   Collection Time: 11/10/17  7:48 AM  Result Value Ref Range   Prothrombin Time 23.0 (H) 11.4 - 15.2 seconds   INR 2.06     Comment: Performed at [facility], [address] [place], [place] [address]  APTT     Status: Abnormal   Collection Time: 11/10/17  7:48 AM  Result Value Ref Range   aPTT 37 (H) 24 - 36 seconds    Comment:        IF BASELINE aPTT IS ELEVATED, SUGGEST PATIENT RISK ASSESSMENT BE USED TO DETERMINE APPROPRIATE ANTICOAGULANT THERAPY. Performed at [facility], [address] [place], [place] [address]   CBC     Status: Abnormal   Collection Time: 11/16/17  9:05 AM  Result Value Ref Range   WBC 7.8 3.8 - 10.6 K/uL   RBC 4.01 (L) 4.40 - 5.90 MIL/uL   Hemoglobin 13.9 13.0 - 18.0 g/dL   HCT 40.4 40.0 - 52.0 %   MCV 100.5 (H) 80.0 - 100.0 fL   MCH 34.6 (H) 26.0 - 34.0 pg   MCHC 34.5 32.0 - 36.0 g/dL   RDW 14.0 11.5 - 14.5 %   Platelets 260 150 - 440 K/uL    Comment: Performed at [facility], [address] [place], [place] [address]  Sedimentation rate     Status: Abnormal   Collection Time: 11/16/17  9:05 AM  Result Value Ref Range   Sed Rate 49 (H) 0 - 20 mm/hr    Comment: Performed at [facility], [address] [place], [place] [address]  Basic metabolic panel     Status: Abnormal   Collection Time: 11/16/17  9:05 AM  Result Value Ref Range   Sodium 139 135 - 145 mmol/L   Potassium 3.8 3.5 - 5.1 mmol/L   Chloride 102 101 - 111 mmol/L   CO2 23 22 - 32 mmol/L   Glucose, Bld 175 (H) 65 - 99 mg/dL   BUN 16 6 - 20 mg/dL   Creatinine, Ser 0.91 0.61 - 1.24 mg/dL   Calcium 9.9  8.9 - 10.3 mg/dL   GFR calc non Af Amer >60 >60 mL/min   GFR calc Af Amer >60 >60 mL/min    Comment: (NOTE) The eGFR  has been calculated using the CKD EPI equation. This calculation has not been validated in all clinical situations. eGFR's persistently <60 mL/min signify possible Chronic Kidney Disease.    Anion gap 14 5 - 15    Comment: Performed at [facility], [address] [place], [place] [address]  APTT     Status: None   Collection Time: 11/16/17  9:05 AM  Result Value Ref Range   aPTT 33 24 - 36 seconds    Comment: Performed at [facility], [address] [place], [place] [address]  Protime-INR     Status: Abnormal   Collection Time: 11/16/17  9:05 AM  Result Value Ref Range   Prothrombin Time 17.2 (H) 11.4 - 15.2 seconds   INR 1.42     Comment: Performed at [facility], [address] [place], [place] [address]  Urinalysis, Routine w reflex microscopic     Status: Abnormal   Collection Time: 11/16/17  9:05 AM  Result Value Ref Range   Color, Urine AMBER (A) YELLOW    Comment: BIOCHEMICALS MAY BE AFFECTED BY COLOR   APPearance HAZY (A) CLEAR   Specific Gravity, Urine 1.023 1.005 - 1.030   pH 5.0 5.0 - 8.0   Glucose, UA NEGATIVE NEGATIVE mg/dL   Hgb urine dipstick NEGATIVE NEGATIVE   Bilirubin Urine NEGATIVE NEGATIVE   Ketones, ur NEGATIVE NEGATIVE mg/dL   Protein, ur NEGATIVE NEGATIVE mg/dL   Nitrite NEGATIVE NEGATIVE   Leukocytes, UA NEGATIVE NEGATIVE    Comment: Performed at [facility], [address], [place], [place] [address]  Urine culture     Status: None   Collection Time: 11/16/17  9:05 AM  Result Value Ref Range   Specimen Description      URINE, RANDOM Performed at [facility], [address], [place], [place] [address]    Special Requests      NONE Performed at [facility], [address], [place], [place] [address]    Culture      NO GROWTH Performed at [place], [place] [address], [place], [place] [address]    Report Status 11/17/2017 FINAL   Surgical pcr screen     Status: None   Collection Time: 11/16/17  9:05 AM  Result Value Ref Range   MRSA, PCR NEGATIVE NEGATIVE   Staphylococcus aureus NEGATIVE NEGATIVE    Comment: (NOTE) The Xpert SA Assay (FDA approved for NASAL specimens in patients 34 years of age and older), is one component of a comprehensive surveillance program. It is not intended to diagnose infection nor to guide or monitor treatment. Performed at [facility], [address] [place], [place] [address]   Type and screen [place]     Status: None   Collection Time: 11/16/17  9:05 AM  Result Value Ref Range   ABO/RH(D) A POS    Antibody Screen NEG    Sample Expiration 11/30/2017    Extend sample reason      NO TRANSFUSIONS OR PREGNANCY IN THE PAST 3 MONTHS Performed at [facility], [address] [place], [place] [address]   Glucose, capillary     Status: Abnormal   Collection Time: 11/30/17 10:23 AM  Result Value Ref Range   Glucose-Capillary 113 (H) 65 - 99 mg/dL  Glucose, capillary     Status: Abnormal   Collection Time: 12/13/17 12:00 PM  Result Value Ref Range   Glucose-Capillary 263 (H) 65 -  99 mg/dL  Type and screen [place]     Status: None   Collection Time: 12/13/17 12:32 PM  Result Value Ref Range   ABO/RH(D) A POS    Antibody Screen NEG    Sample Expiration      12/16/2017 Performed at [facility], [address] [place], [place] [address]   Protime-INR     Status: None   Collection Time: 12/13/17 12:32 PM  Result Value Ref Range   Prothrombin Time 14.3 11.4 - 15.2 seconds   INR 1.12     Comment: Performed at [facility], [address], [place], [place] [address]  Surgical pathology     Status: None   Collection Time: 12/13/17  4:03 PM  Result Value Ref Range   SURGICAL PATHOLOGY      Surgical  Pathology CASE: [id number] PATIENT: Hilton Sinclair Surgical Pathology Report     SPECIMEN SUBMITTED: A. Femoral head, right  CLINICAL HISTORY: None provided  PRE-OPERATIVE DIAGNOSIS: Primary osteoarthritis of right hip  POST-OPERATIVE DIAGNOSIS: None provided.     DIAGNOSIS: A. RIGHT FEMORAL HEAD; ARTHROPLASTY: - OSTEOARTHRITIS.   GROSS DESCRIPTION:  A. Labeled: right femoral head  Size of specimen:      Head -4.5 x 4.6 cm      Neck -3.2 x 2.9 cm  Articular surface: yellow-tan with focal disruption  Cut surface: pink-tan  Other findings: received in formalin  Block summary: 1 - representative section(s), post decalcification    Final Diagnosis performed by Delorse Lek, MD.  Electronically signed 12/16/2017 1:21:33PM    The electronic signature indicates that the named Attending Pathologist has evaluated the specimen  Technical component performed at [facility], [address], [place], [place] [address] Lab: [phone] Dir: Sa Lenard Galloway, MD, MMM  Professional component performed at [facility], [facility], [address], [place], [place] [address] Lab: [phone] Dir: Dellia Nims. Rubinas, MD    Glucose, capillary     Status: Abnormal   Collection Time: 12/13/17  4:42 PM  Result Value Ref Range   Glucose-Capillary 168 (H) 65 - 99 mg/dL  Glucose, capillary     Status: Abnormal   Collection Time: 12/13/17  7:01 PM  Result Value Ref Range   Glucose-Capillary 254 (H) 65 - 99 mg/dL   Comment 1 Notify RN   Glucose, capillary     Status: Abnormal   Collection Time: 12/13/17  9:00 PM  Result Value Ref Range   Glucose-Capillary 223 (H) 65 - 99 mg/dL   Comment 1 Notify RN   Protime-INR     Status: None   Collection Time: 12/14/17  4:48 AM  Result Value Ref Range   Prothrombin Time 14.6 11.4 - 15.2 seconds   INR 1.15     Comment: Performed at [facility], [address] [place], [place] [address]  Glucose, capillary      Status: Abnormal   Collection Time: 12/14/17  7:57 AM  Result Value Ref Range   Glucose-Capillary 241 (H) 65 - 99 mg/dL  CBC     Status: Abnormal   Collection Time: 12/14/17 10:16 AM  Result Value Ref Range   WBC 8.8 3.8 - 10.6 K/uL   RBC 3.35 (L) 4.40 - 5.90 MIL/uL   Hemoglobin 11.5 (L) 13.0 - 18.0 g/dL   HCT 33.8 (L) 40.0 - 52.0 %   MCV 101.1 (H) 80.0 - 100.0 fL   MCH 34.2 (H) 26.0 - 34.0 pg   MCHC 33.9 32.0 -  36.0 g/dL   RDW 13.9 11.5 - 14.5 %   Platelets 222 150 - 440 K/uL    Comment: Performed at [facility], [address] [place], [place] [address]  Basic metabolic panel     Status: Abnormal   Collection Time: 12/14/17 11:08 AM  Result Value Ref Range   Sodium 133 (L) 135 - 145 mmol/L   Potassium 3.6 3.5 - 5.1 mmol/L   Chloride 100 (L) 101 - 111 mmol/L   CO2 24 22 - 32 mmol/L   Glucose, Bld 279 (H) 65 - 99 mg/dL   BUN 14 6 - 20 mg/dL   Creatinine, Ser 1.06 0.61 - 1.24 mg/dL   Calcium 8.3 (L) 8.9 - 10.3 mg/dL   GFR calc non Af Amer >60 >60 mL/min   GFR calc Af Amer >60 >60 mL/min    Comment: (NOTE) The eGFR has been calculated using the CKD EPI equation. This calculation has not been validated in all clinical situations. eGFR's persistently <60 mL/min signify possible Chronic Kidney Disease.    Anion gap 9 5 - 15    Comment: Performed at [facility], [address] [place], [place] [address]  Glucose, capillary     Status: Abnormal   Collection Time: 12/14/17 11:57 AM  Result Value Ref Range   Glucose-Capillary 260 (H) 65 - 99 mg/dL  Glucose, capillary     Status: Abnormal   Collection Time: 12/14/17  4:14 PM  Result Value Ref Range   Glucose-Capillary 213 (H) 65 - 99 mg/dL   Comment 1 Notify RN   Glucose, capillary     Status: Abnormal   Collection Time: 12/14/17  6:01 PM  Result Value Ref Range   Glucose-Capillary 195 (H) 65 - 99 mg/dL  CULTURE, BLOOD (ROUTINE X 2) w Reflex to ID Panel     Status: None   Collection Time: 12/14/17   6:08 PM  Result Value Ref Range   Specimen Description BLOOD BLOOD RIGHT ARM    Special Requests      BOTTLES DRAWN AEROBIC AND ANAEROBIC Blood Culture adequate volume   Culture      NO GROWTH 5 DAYS Performed at [facility], [address] [place], [place] [address]    Report Status 12/19/2017 FINAL   Blood gas, venous     Status: Abnormal   Collection Time: 12/14/17  6:30 PM  Result Value Ref Range   pH, Ven 7.44 (H) 7.250 - 7.430   pCO2, Ven 39 (L) 44.0 - 60.0 mmHg   pO2, Ven 41.0 32.0 - 45.0 mmHg   Bicarbonate 26.5 20.0 - 28.0 mmol/L   Acid-Base Excess 2.2 (H) 0.0 - 2.0 mmol/L   O2 Saturation 78.4 %   Patient temperature 37.0    Collection site VEIN    Sample type VENOUS     Comment: Performed at [facility], [address] [place],  [address]  CBC     Status: Abnormal   Collection Time: 12/14/17  6:32 PM  Result Value Ref Range   WBC 8.6 3.8 - 10.6 K/uL   RBC 3.45 (L) 4.40 - 5.90 MIL/uL   Hemoglobin 11.7 (L) 13.0 - 18.0 g/dL   HCT 34.2 (L) 40.0 - 52.0 %   MCV 99.1 80.0 - 100.0 fL   MCH 33.9 26.0 - 34.0 pg   MCHC 34.2 32.0 - 36.0 g/dL   RDW 13.6 11.5 - 14.5 %   Platelets 204 150 - 440 K/uL  Comment: Performed at [facility], [address] [place], [place] [address]  Basic metabolic panel     Status: Abnormal   Collection Time: 12/14/17  6:32 PM  Result Value Ref Range   Sodium 134 (L) 135 - 145 mmol/L   Potassium 4.0 3.5 - 5.1 mmol/L   Chloride 100 (L) 101 - 111 mmol/L   CO2 24 22 - 32 mmol/L   Glucose, Bld 224 (H) 65 - 99 mg/dL   BUN 13 6 - 20 mg/dL   Creatinine, Ser 0.93 0.61 - 1.24 mg/dL   Calcium 8.7 (L) 8.9 - 10.3 mg/dL   GFR calc non Af Amer >60 >60 mL/min   GFR calc Af Amer >60 >60 mL/min    Comment: (NOTE) The eGFR has been calculated using the CKD EPI equation. This calculation has not been validated in all clinical situations. eGFR's persistently <60 mL/min signify possible Chronic Kidney Disease.     Anion gap 10 5 - 15    Comment: Performed at [facility], [address] [place], [place] [address]  APTT     Status: None   Collection Time: 12/14/17  6:32 PM  Result Value Ref Range   aPTT 30 24 - 36 seconds    Comment: Performed at [facility], [address] [place], [place] [address]  Blood gas, arterial     Status: Abnormal   Collection Time: 12/14/17  7:52 PM  Result Value Ref Range   FIO2 0.36    Delivery systems NASAL CANNULA    pH, Arterial 7.45 7.350 - 7.450   pCO2 arterial 37 32.0 - 48.0 mmHg   pO2, Arterial 88 83.0 - 108.0 mmHg   Bicarbonate 26.9 20.0 - 28.0 mmol/L   Acid-Base Excess 3.2 (H) 0.0 - 2.0 mmol/L   O2 Saturation 96.7 %   Patient temperature 38.1    Collection site RIGHT RADIAL    Sample type ARTERIAL DRAW    Allens test (pass/fail) PASS PASS    Comment: Performed at [facility], [address] [place], [place] [address]  Urinalysis, Complete w Microscopic     Status: Abnormal   Collection Time: 12/14/17 10:12 PM  Result Value Ref Range   Color, Urine STRAW (A) YELLOW   APPearance CLEAR (A) CLEAR   Specific Gravity, Urine 1.006 1.005 - 1.030   pH 5.0 5.0 - 8.0   Glucose, UA NEGATIVE NEGATIVE mg/dL   Hgb urine dipstick NEGATIVE NEGATIVE   Bilirubin Urine NEGATIVE NEGATIVE   Ketones, ur NEGATIVE NEGATIVE mg/dL   Protein, ur NEGATIVE NEGATIVE mg/dL   Nitrite NEGATIVE NEGATIVE   Leukocytes, UA NEGATIVE NEGATIVE   RBC / HPF NONE SEEN 0 - 5 RBC/hpf   WBC, UA 0-5 0 - 5 WBC/hpf   Bacteria, UA NONE SEEN NONE SEEN   Squamous Epithelial / LPF NONE SEEN NONE SEEN    Comment: Performed at [facility], [address], [place], [place] [address]  Urine Culture     Status: None   Collection Time: 12/14/17 10:12 PM  Result Value Ref Range   Specimen Description      URINE, RANDOM Performed at [facility], [address], [place], [place] [address]    Special Requests      NONE Performed at [facility], [address] [place], [place] [address]    Culture      NO GROWTH Performed at [facility], [place] [address] [place], [place]  [address]    Report Status 12/16/2017 FINAL   Lactic acid, plasma     Status: None   Collection Time: 12/15/17 12:24 AM  Result Value Ref Range   Lactic Acid, Venous 1.2 0.5 - 1.9 mmol/L    Comment: Performed at [facility], [address] [place], [place] [address]  CULTURE, BLOOD (ROUTINE X 2) w Reflex to ID Panel     Status: None   Collection Time: 12/15/17 12:24 AM  Result Value Ref Range   Specimen Description BLOOD RIGHT HAND    Special Requests      BOTTLES DRAWN AEROBIC AND ANAEROBIC Blood Culture results may not be optimal due to an excessive volume of blood received in culture bottles   Culture      NO GROWTH 5 DAYS Performed at [facility], [address], [place], [place] [address]    Report Status 12/20/2017 FINAL   Procalcitonin - Baseline     Status: None   Collection Time: 12/15/17 12:24 AM  Result Value Ref Range   Procalcitonin 0.21 ng/mL    Comment:        Interpretation: PCT (Procalcitonin) <= 0.5 ng/mL: Systemic infection (sepsis) is not likely. Local bacterial infection is possible. (NOTE)       Sepsis PCT Algorithm           Lower Respiratory Tract                                      Infection PCT Algorithm    ----------------------------     ----------------------------         PCT < 0.25 ng/mL                PCT < 0.10 ng/mL         Strongly encourage             Strongly discourage   discontinuation of antibiotics    initiation of antibiotics    ----------------------------     -----------------------------       PCT 0.25 - 0.50 ng/mL            PCT 0.10 - 0.25 ng/mL               OR       >80% decrease in PCT            Discourage initiation of                                            antibiotics      Encourage discontinuation           of antibiotics     ----------------------------     -----------------------------         PCT >= 0.50 ng/mL              PCT 0.26 - 0.50 ng/mL               AND        <80% decrease in PCT             Encourage initiation of  antibiotics       Encourage continuation           of antibiotics    ----------------------------     -----------------------------        PCT >= 0.50 ng/mL                  PCT > 0.50 ng/mL               AND         increase in PCT                  Strongly encourage                                      initiation of antibiotics    Strongly encourage escalation           of antibiotics                                     -----------------------------                                           PCT <= 0.25 ng/mL                                                 OR                                        > 80% decrease in PCT                                     Discontinue / Do not initiate                                             antibiotics Performed at [facility], [address] [place], [place] [address]   Protime-INR     Status: None   Collection Time: 12/15/17  7:28 AM  Result Value Ref Range   Prothrombin Time 13.6 11.4 - 15.2 seconds   INR 1.05     Comment: Performed at [facility], [address] [place], [place] [address]  Procalcitonin     Status: None   Collection Time: 12/15/17  7:28 AM  Result Value Ref Range   Procalcitonin 0.23 ng/mL    Comment:        Interpretation: PCT (Procalcitonin) <= 0.5 ng/mL: Systemic infection (sepsis) is not likely. Local bacterial infection is possible. (NOTE)       Sepsis PCT Algorithm           Lower Respiratory Tract  Infection PCT Algorithm    ----------------------------     ----------------------------         PCT < 0.25 ng/mL                PCT < 0.10 ng/mL         Strongly encourage             Strongly discourage    discontinuation of antibiotics    initiation of antibiotics    ----------------------------     -----------------------------       PCT 0.25 - 0.50 ng/mL            PCT 0.10 - 0.25 ng/mL               OR       >80% decrease in PCT            Discourage initiation of                                            antibiotics      Encourage discontinuation           of antibiotics    ----------------------------     -----------------------------         PCT >= 0.50 ng/mL              PCT 0.26 - 0.50 ng/mL               AND        <80% decrease in PCT             Encourage initiation of                                             antibiotics       Encourage continuation           of antibiotics    ----------------------------     -----------------------------        PCT >= 0.50 ng/mL                  PCT > 0.50 ng/mL               AND         increase in PCT                  Strongly encourage                                      initiation of antibiotics    Strongly encourage escalation           of antibiotics                                     -----------------------------                                           PCT <= 0.25 ng/mL  OR                                        > 80% decrease in PCT                                     Discontinue / Do not initiate                                             antibiotics Performed at [facility], [address] [place], [place] [address]   Glucose, capillary     Status: Abnormal   Collection Time: 12/15/17  8:22 AM  Result Value Ref Range   Glucose-Capillary 212 (H) 65 - 99 mg/dL  Glucose, capillary     Status: Abnormal   Collection Time: 12/15/17 11:41 AM  Result Value Ref Range   Glucose-Capillary 232 (H) 65 - 99 mg/dL  Glucose, capillary     Status: Abnormal   Collection Time: 12/15/17  5:43 PM  Result Value Ref Range   Glucose-Capillary 237 (H) 65 - 99 mg/dL  Glucose,  capillary     Status: Abnormal   Collection Time: 12/15/17  9:38 PM  Result Value Ref Range   Glucose-Capillary 247 (H) 65 - 99 mg/dL   Comment 1 Notify RN   Protime-INR     Status: Abnormal   Collection Time: 12/16/17  4:40 AM  Result Value Ref Range   Prothrombin Time 15.3 (H) 11.4 - 15.2 seconds   INR 1.22     Comment: Performed at [facility], [address] [place], [place] [address]  Procalcitonin     Status: None   Collection Time: 12/16/17  4:40 AM  Result Value Ref Range   Procalcitonin 0.36 ng/mL    Comment:        Interpretation: PCT (Procalcitonin) <= 0.5 ng/mL: Systemic infection (sepsis) is not likely. Local bacterial infection is possible. (NOTE)       Sepsis PCT Algorithm           Lower Respiratory Tract                                      Infection PCT Algorithm    ----------------------------     ----------------------------         PCT < 0.25 ng/mL                PCT < 0.10 ng/mL         Strongly encourage             Strongly discourage   discontinuation of antibiotics    initiation of antibiotics    ----------------------------     -----------------------------       PCT 0.25 - 0.50 ng/mL            PCT 0.10 - 0.25 ng/mL               OR       >80% decrease in PCT            Discourage initiation of  antibiotics      Encourage discontinuation           of antibiotics    ----------------------------     -----------------------------         PCT >= 0.50 ng/mL              PCT 0.26 - 0.50 ng/mL               AND        <80% decrease in PCT             Encourage initiation of                                             antibiotics       Encourage continuation           of antibiotics    ----------------------------     -----------------------------        PCT >= 0.50 ng/mL                  PCT > 0.50 ng/mL               AND         increase in PCT                  Strongly encourage                                       initiation of antibiotics    Strongly encourage escalation           of antibiotics                                     -----------------------------                                           PCT <= 0.25 ng/mL                                                 OR                                        > 80% decrease in PCT                                     Discontinue / Do not initiate                                             antibiotics Performed at [facility], [address], [place], [place] [address]   CBC with Differential/Platelet     Status: Abnormal   Collection Time: 12/16/17  4:40 AM  Result Value Ref Range   WBC 10.4 3.8 - 10.6 K/uL   RBC 2.80 (L) 4.40 - 5.90 MIL/uL   Hemoglobin 9.6 (L) 13.0 - 18.0 g/dL   HCT 27.5 (L) 40.0 - 52.0 %   MCV 98.5 80.0 - 100.0 fL   MCH 34.2 (H) 26.0 - 34.0 pg   MCHC 34.7 32.0 - 36.0 g/dL   RDW 13.4 11.5 - 14.5 %   Platelets 190 150 - 440 K/uL   Neutrophils Relative % 73 %   Neutro Abs 7.6 (H) 1.4 - 6.5 K/uL   Lymphocytes Relative 10 %   Lymphs Abs 1.1 1.0 - 3.6 K/uL   Monocytes Relative 13 %   Monocytes Absolute 1.3 (H) 0.2 - 1.0 K/uL   Eosinophils Relative 4 %   Eosinophils Absolute 0.4 0 - 0.7 K/uL   Basophils Relative 0 %   Basophils Absolute 0.0 0 - 0.1 K/uL    Comment: Performed at [facility], [address] [place], [place] [address]  Basic metabolic panel     Status: Abnormal   Collection Time: 12/16/17  4:40 AM  Result Value Ref Range   Sodium 132 (L) 135 - 145 mmol/L   Potassium 3.5 3.5 - 5.1 mmol/L   Chloride 96 (L) 101 - 111 mmol/L   CO2 28 22 - 32 mmol/L   Glucose, Bld 208 (H) 65 - 99 mg/dL   BUN 15 6 - 20 mg/dL   Creatinine, Ser 0.96 0.61 - 1.24 mg/dL   Calcium 8.0 (L) 8.9 - 10.3 mg/dL   GFR calc non Af Amer >60 >60 mL/min   GFR calc Af Amer >60 >60 mL/min    Comment: (NOTE) The eGFR has been calculated using the CKD EPI equation. This calculation has not been  validated in all clinical situations. eGFR's persistently <60 mL/min signify possible Chronic Kidney Disease.    Anion gap 8 5 - 15    Comment: Performed at [facility], [address] [place], [place] [address]  Glucose, capillary     Status: Abnormal   Collection Time: 12/16/17  7:45 AM  Result Value Ref Range   Glucose-Capillary 210 (H) 65 - 99 mg/dL   Comment 1 Notify RN   ECHOCARDIOGRAM COMPLETE     Status: None   Collection Time: 12/16/17  9:47 AM  Result Value Ref Range   Weight 3,326.3 oz   Height 68 in   BP 139/56 mmHg  Glucose, capillary     Status: Abnormal   Collection Time: 12/16/17 12:20 PM  Result Value Ref Range   Glucose-Capillary 225 (H) 65 - 99 mg/dL   Comment 1 Notify RN   Glucose, capillary     Status: Abnormal   Collection Time: 12/16/17  4:34 PM  Result Value Ref Range   Glucose-Capillary 194 (H) 65 - 99 mg/dL   Comment 1 Notify RN   Glucose, capillary     Status: Abnormal   Collection Time: 12/16/17  9:11 PM  Result Value Ref Range   Glucose-Capillary 182 (H) 65 - 99 mg/dL   Comment 1 Notify RN   Protime-INR     Status: Abnormal   Collection Time: 12/17/17  3:24 AM  Result Value Ref Range   Prothrombin Time 15.6 (H) 11.4 - 15.2 seconds   INR 1.25     Comment: Performed at [facility], [address] [place], [place] [address]  Glucose, capillary     Status: Abnormal  Collection Time: 12/17/17  7:35 AM  Result Value Ref Range   Glucose-Capillary 186 (H) 65 - 99 mg/dL   Comment 1 Notify RN   Glucose, capillary     Status: Abnormal   Collection Time: 12/17/17 11:46 AM  Result Value Ref Range   Glucose-Capillary 232 (H) 65 - 99 mg/dL   Comment 1 Notify RN   VITAMIN D 25 Hydroxy (Vit-D Deficiency, Fractures)     Status: None   Collection Time: 01/12/18  4:23 PM  Result Value Ref Range   Vit D, 25-Hydroxy 37.8 30.0 - 100.0 ng/mL    Comment: (NOTE) Vitamin D deficiency has been defined by the Institute of Medicine and an  Endocrine Society practice guideline as a level of serum 25-OH vitamin D less than 20 ng/mL (1,2). The Endocrine Society went on to further define vitamin D insufficiency as a level between 21 and 29 ng/mL (2). 1. IOM (Institute of Medicine). 2010. Dietary reference   intakes for calcium and D. [place]: The   [employer]. 2. Holick MF, [place], Bischoff-Ferrari HA, et al.   Evaluation, treatment, and prevention of vitamin D   deficiency: an Endocrine Society clinical practice   guideline. JCEM. 2011 Jul; 96(7):1911-30. Performed At: [facility] [place], [place] [id number] Rush Farmer MD [id number] Performed at [facility], [address] [place], [place] [address]   Hemoglobin A1c     Status: Abnormal   Collection Time: 01/12/18  4:23 PM  Result Value Ref Range   Hgb A1c MFr Bld 5.8 (H) 4.8 - 5.6 %    Comment: (NOTE) Pre diabetes:          5.7%-6.4% Diabetes:              >6.4% Glycemic control for   <7.0% adults with diabetes    Mean Plasma Glucose 119.76 mg/dL    Comment: Performed at [facility] [address] [place], [place] [address]  Protime-INR     Status: Abnormal   Collection Time: 01/12/18  4:23 PM  Result Value Ref Range   Prothrombin Time 18.3 (H) 11.4 - 15.2 seconds   INR 1.53     Comment: Performed at [facility], [address] [place], [place] [address]  CBC with Differential/Platelet     Status: Abnormal   Collection Time: 01/12/18  4:23 PM  Result Value Ref Range   WBC 7.1 3.8 - 10.6 K/uL   RBC 3.60 (L) 4.40 - 5.90 MIL/uL   Hemoglobin 12.3 (L) 13.0 - 18.0 g/dL   HCT 35.9 (L) 40.0 - 52.0 %   MCV 99.5 80.0 - 100.0 fL   MCH 34.3 (H) 26.0 - 34.0 pg   MCHC 34.4 32.0 - 36.0 g/dL   RDW 15.3 (H) 11.5 - 14.5 %   Platelets 274 150 - 440 K/uL   Neutrophils Relative % 70 %   Neutro Abs 5.0 1.4 - 6.5 K/uL   Lymphocytes Relative 15 %   Lymphs Abs 1.1 1.0 - 3.6 K/uL   Monocytes  Relative 11 %   Monocytes Absolute 0.8 0.2 - 1.0 K/uL   Eosinophils Relative 4 %   Eosinophils Absolute 0.3 0 - 0.7 K/uL   Basophils Relative 0 %   Basophils Absolute 0.0 0 - 0.1 K/uL    Comment: Performed at [facility], [address], [place], [place] [address]  Comprehensive metabolic panel     Status: Abnormal   Collection Time:  01/12/18  4:23 PM  Result Value Ref Range   Sodium 138 135 - 145 mmol/L   Potassium 3.4 (L) 3.5 - 5.1 mmol/L   Chloride 102 101 - 111 mmol/L   CO2 24 22 - 32 mmol/L   Glucose, Bld 191 (H) 65 - 99 mg/dL   BUN 11 6 - 20 mg/dL   Creatinine, Ser 0.91 0.61 - 1.24 mg/dL   Calcium 9.2 8.9 - 10.3 mg/dL   Total Protein 7.7 6.5 - 8.1 g/dL   Albumin 3.6 3.5 - 5.0 g/dL   AST 24 15 - 41 U/L   ALT 12 (L) 17 - 63 U/L   Alkaline Phosphatase 90 38 - 126 U/L   Total Bilirubin 0.7 0.3 - 1.2 mg/dL   GFR calc non Af Amer >60 >60 mL/min   GFR calc Af Amer >60 >60 mL/min    Comment: (NOTE) The eGFR has been calculated using the CKD EPI equation. This calculation has not been validated in all clinical situations. eGFR's persistently <60 mL/min signify possible Chronic Kidney Disease.    Anion gap 12 5 - 15    Comment: Performed at [facility], [address] [place],  [address]   Objective  Body mass index is 28.43 kg/m. Wt Readings from Last 3 Encounters:  01/20/18 187 lb (84.8 kg)  01/12/18 188 lb 14.4 oz (85.7 kg)  01/11/18 188 lb (85.3 kg)   Temp Readings from Last 3 Encounters:  01/20/18 (!) 97.4 F (36.3 C) (Oral)  01/12/18 98.2 F (36.8 C) (Oral)  01/11/18 98.4 F (36.9 C) (Oral)   BP Readings from Last 3 Encounters:  01/20/18 (!) 128/56  01/12/18 (!) 94/40  01/11/18 (!) 185/69   Pulse Readings from Last 3 Encounters:  01/20/18 65  01/12/18 64  01/11/18 68    Physical Exam  Constitutional: He is oriented to person, place, and time. Vital signs are normal. He appears well-developed and well-nourished. He is  cooperative.  HENT:  Head: Normocephalic and atraumatic.  Mouth/Throat: Oropharynx is clear and moist and mucous membranes are normal.  Eyes: Pupils are equal, round, and reactive to light. Conjunctivae are normal.  Cardiovascular: Normal rate, regular rhythm and normal heart sounds.  NSR today  Pulmonary/Chest: Effort normal and breath sounds normal.  Abdominal:  Some bruising on ab prior lovenox shots   Neurological: He is alert and oriented to person, place, and time.  Falling asleep on exam  Using walker today   Skin: Skin is warm, dry and intact.  Psychiatric: He has a normal mood and affect. His speech is normal and behavior is normal. Judgment and thought content normal. Cognition and memory are normal.  Nursing note and vitals reviewed.   Assessment   1. Chills w/o fever s/p ortho surgery 12/2017  2. H/o OSA not using bipap, abnormal CXR pending CT chest  3. DM 2 with hypoglycemia A1C 5.8 01/12/18  4. Abnormal CT entero ab/pelvis with colonic mass, 5HIAA test urine 24 hr normal  5. Afib hx subtherapeutic on coumadin 1.53 on 01/12/18  6. hypoK 3.4  7. HM Plan  1.  Urine and blood cxs normal 12/14/17 and 12/15/17  Will repeat blood cultures with h/o hardward 12/2017 ortho surgery x 2 cultures  -all labs need to be done [facility] pt is hard stick  Pt isnt anemic  TSH normal 10/03/17  CXR abnormal but pending CT chest 01/23/18 s/p recent tx Doxycycline now off  2. F/u Dr. [place] Bells  Wife wants pt  to have pfts  Pending CT chest 01/23/18  Pt is noncompliant with bipap  3.  F/u with Dr. Gabriel Carina  rec reduce levemir to 80 units instead of 90 due to lows  Due for fasting lipid check in future  Cont SSI  Urine protein was due 01/07/18  Will do foot exam in future  Ask about eye exam in future  4. Spoke with Dr. Bary Castilla who does not want to bx for now and will rec repeat CT entero ab/pelvis 05/19/18 asked wife/pt to f/u gen surgery or GI for this  5. INR at [facility] Monday  Given list of vit K foods  and coumadin  On coumadin 4 mg qd recently increased to 4 mg qd  6. Given list if high k foods but be aware of #5  7.  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 H/o skin cancer    Provider: Dr. Olivia Mackie McLean-Scocuzza-Internal Medicine

## 2018-01-20 NOTE — Progress Notes (Signed)
Pre visit review using our clinic review tool, if applicable. No additional management support is needed unless otherwise documented below in the visit note. 

## 2018-01-23 ENCOUNTER — Ambulatory Visit
Admission: RE | Admit: 2018-01-23 | Discharge: 2018-01-23 | Disposition: A | Discharge status: Home / Self Care | Payer: Medicare Other | Source: Ambulatory Visit | Attending: Internal Medicine | Admitting: Internal Medicine

## 2018-01-23 ENCOUNTER — Other Ambulatory Visit: Payer: Self-pay | Admitting: Internal Medicine

## 2018-01-23 ENCOUNTER — Other Ambulatory Visit
Admission: RE | Admit: 2018-01-23 | Discharge: 2018-01-23 | Disposition: A | Discharge status: Home / Self Care | Payer: Medicare Other | Source: Ambulatory Visit | Attending: Internal Medicine | Admitting: Internal Medicine

## 2018-01-23 DIAGNOSIS — R630 Anorexia: Secondary | ICD-10-CM | POA: Diagnosis not present

## 2018-01-23 DIAGNOSIS — R0602 Shortness of breath: Secondary | ICD-10-CM | POA: Diagnosis not present

## 2018-01-23 DIAGNOSIS — R918 Other nonspecific abnormal finding of lung field: Secondary | ICD-10-CM | POA: Insufficient documentation

## 2018-01-23 DIAGNOSIS — J849 Interstitial pulmonary disease, unspecified: Secondary | ICD-10-CM

## 2018-01-23 DIAGNOSIS — R634 Abnormal weight loss: Secondary | ICD-10-CM | POA: Diagnosis not present

## 2018-01-23 DIAGNOSIS — R059 Cough, unspecified: Secondary | ICD-10-CM

## 2018-01-23 DIAGNOSIS — I700 Atherosclerosis of aorta: Secondary | ICD-10-CM | POA: Insufficient documentation

## 2018-01-23 DIAGNOSIS — R05 Cough: Secondary | ICD-10-CM | POA: Diagnosis not present

## 2018-01-23 DIAGNOSIS — Z951 Presence of aortocoronary bypass graft: Secondary | ICD-10-CM | POA: Insufficient documentation

## 2018-01-23 DIAGNOSIS — I517 Cardiomegaly: Secondary | ICD-10-CM | POA: Diagnosis not present

## 2018-01-23 DIAGNOSIS — R911 Solitary pulmonary nodule: Secondary | ICD-10-CM

## 2018-01-23 DIAGNOSIS — R791 Abnormal coagulation profile: Secondary | ICD-10-CM

## 2018-01-23 LAB — PROTIME-INR
INR: 2.49
Prothrombin Time: 26.7 seconds — ABNORMAL HIGH (ref 11.4–15.2)

## 2018-01-23 MED ORDER — IOPAMIDOL (ISOVUE-370) INJECTION 76%
75.0000 mL | Freq: Once | INTRAVENOUS | Status: AC | PRN
  Administered 2018-01-23: 75 mL via INTRAVENOUS

## 2018-01-24 DIAGNOSIS — Z471 Aftercare following joint replacement surgery: Secondary | ICD-10-CM | POA: Diagnosis not present

## 2018-01-24 DIAGNOSIS — E1140 Type 2 diabetes mellitus with diabetic neuropathy, unspecified: Secondary | ICD-10-CM | POA: Diagnosis not present

## 2018-01-24 DIAGNOSIS — I129 Hypertensive chronic kidney disease with stage 1 through stage 4 chronic kidney disease, or unspecified chronic kidney disease: Secondary | ICD-10-CM | POA: Diagnosis not present

## 2018-01-24 DIAGNOSIS — I480 Paroxysmal atrial fibrillation: Secondary | ICD-10-CM | POA: Diagnosis not present

## 2018-01-24 DIAGNOSIS — N182 Chronic kidney disease, stage 2 (mild): Secondary | ICD-10-CM | POA: Diagnosis not present

## 2018-01-24 DIAGNOSIS — E1122 Type 2 diabetes mellitus with diabetic chronic kidney disease: Secondary | ICD-10-CM | POA: Diagnosis not present

## 2018-01-26 DIAGNOSIS — I129 Hypertensive chronic kidney disease with stage 1 through stage 4 chronic kidney disease, or unspecified chronic kidney disease: Secondary | ICD-10-CM | POA: Diagnosis not present

## 2018-01-26 DIAGNOSIS — N182 Chronic kidney disease, stage 2 (mild): Secondary | ICD-10-CM | POA: Diagnosis not present

## 2018-01-26 DIAGNOSIS — E1122 Type 2 diabetes mellitus with diabetic chronic kidney disease: Secondary | ICD-10-CM | POA: Diagnosis not present

## 2018-01-26 DIAGNOSIS — Z471 Aftercare following joint replacement surgery: Secondary | ICD-10-CM | POA: Diagnosis not present

## 2018-01-26 DIAGNOSIS — E1140 Type 2 diabetes mellitus with diabetic neuropathy, unspecified: Secondary | ICD-10-CM | POA: Diagnosis not present

## 2018-01-26 DIAGNOSIS — I480 Paroxysmal atrial fibrillation: Secondary | ICD-10-CM | POA: Diagnosis not present

## 2018-01-27 ENCOUNTER — Encounter: Payer: Self-pay | Admitting: *Deleted

## 2018-01-27 ENCOUNTER — Other Ambulatory Visit: Payer: Self-pay

## 2018-01-27 DIAGNOSIS — I119 Hypertensive heart disease without heart failure: Secondary | ICD-10-CM | POA: Insufficient documentation

## 2018-01-27 DIAGNOSIS — Y9389 Activity, other specified: Secondary | ICD-10-CM | POA: Insufficient documentation

## 2018-01-27 DIAGNOSIS — X58XXXA Exposure to other specified factors, initial encounter: Secondary | ICD-10-CM | POA: Insufficient documentation

## 2018-01-27 DIAGNOSIS — I4891 Unspecified atrial fibrillation: Secondary | ICD-10-CM | POA: Diagnosis not present

## 2018-01-27 DIAGNOSIS — Y999 Unspecified external cause status: Secondary | ICD-10-CM | POA: Insufficient documentation

## 2018-01-27 DIAGNOSIS — E1142 Type 2 diabetes mellitus with diabetic polyneuropathy: Secondary | ICD-10-CM | POA: Diagnosis not present

## 2018-01-27 DIAGNOSIS — Z96641 Presence of right artificial hip joint: Secondary | ICD-10-CM | POA: Diagnosis not present

## 2018-01-27 DIAGNOSIS — Y929 Unspecified place or not applicable: Secondary | ICD-10-CM | POA: Insufficient documentation

## 2018-01-27 DIAGNOSIS — S01552A Open bite of oral cavity, initial encounter: Secondary | ICD-10-CM | POA: Diagnosis not present

## 2018-01-27 DIAGNOSIS — E119 Type 2 diabetes mellitus without complications: Secondary | ICD-10-CM | POA: Insufficient documentation

## 2018-01-27 DIAGNOSIS — Z87891 Personal history of nicotine dependence: Secondary | ICD-10-CM | POA: Insufficient documentation

## 2018-01-27 DIAGNOSIS — Z794 Long term (current) use of insulin: Secondary | ICD-10-CM | POA: Diagnosis not present

## 2018-01-27 DIAGNOSIS — I2510 Atherosclerotic heart disease of native coronary artery without angina pectoris: Secondary | ICD-10-CM | POA: Insufficient documentation

## 2018-01-27 DIAGNOSIS — Z7901 Long term (current) use of anticoagulants: Secondary | ICD-10-CM | POA: Diagnosis not present

## 2018-01-27 DIAGNOSIS — S01512A Laceration without foreign body of oral cavity, initial encounter: Secondary | ICD-10-CM | POA: Diagnosis not present

## 2018-01-27 DIAGNOSIS — Z7902 Long term (current) use of antithrombotics/antiplatelets: Secondary | ICD-10-CM | POA: Insufficient documentation

## 2018-01-27 NOTE — ED Triage Notes (Signed)
Pt to ED reporting that he bit his tongue while eating today and is currently on blood thinners.Tongue stopped bleeding earlier in the day but has since started bleeding again and will not stop. Similar event happened in 2016 and pt had to have 12 stitches to close his tongue.

## 2018-01-28 ENCOUNTER — Emergency Department
Admission: EM | Admit: 2018-01-28 | Discharge: 2018-01-28 | Disposition: A | Discharge status: Home / Self Care | Payer: Medicare Other | Attending: Emergency Medicine | Admitting: Emergency Medicine

## 2018-01-28 DIAGNOSIS — Z7901 Long term (current) use of anticoagulants: Secondary | ICD-10-CM

## 2018-01-28 DIAGNOSIS — S01512A Laceration without foreign body of oral cavity, initial encounter: Secondary | ICD-10-CM

## 2018-01-28 DIAGNOSIS — S01552A Open bite of oral cavity, initial encounter: Secondary | ICD-10-CM | POA: Diagnosis not present

## 2018-01-28 LAB — CULTURE, BLOOD (SINGLE)
Culture: NO GROWTH
Culture: NO GROWTH
SPECIAL REQUESTS: ADEQUATE
SPECIAL REQUESTS: ADEQUATE

## 2018-01-28 MED ORDER — LIDOCAINE HCL (PF) 1 % IJ SOLN
INTRAMUSCULAR | Status: AC
  Administered 2018-01-28: 02:00:00
  Filled 2018-01-28: qty 5

## 2018-01-28 MED ORDER — TRANEXAMIC ACID 1000 MG/10ML IV SOLN
500.0000 mg | Freq: Once | INTRAVENOUS | Status: AC
  Administered 2018-01-28: 500 mg via TOPICAL
  Filled 2018-01-28 (×2): qty 10

## 2018-01-28 NOTE — Discharge Instructions (Signed)
We placed a special kind of suture in your tongue (a figure-of-eight) because of a puncture wound that would not stop bleeding.  The suture should dissolve on its own.  Please stick to a soft or even liquid diet for the next week.  Follow up with your doctor or the ENT specialist as needed within a week.  Return to the emergency department if you develop new or worsening symptoms that concern you.

## 2018-01-28 NOTE — ED Provider Notes (Signed)
[facility] Emergency Department Provider Note  ____________________________________________   First MD Initiated Contact with Patient 01/28/18 0023     (approximate)  I have reviewed the triage vital signs and the nursing notes.   HISTORY  Chief Complaint bite tongue    HPI Chase Cabrera is a 72 y.o. male with a past medical history as listed below which notably includes atrial fibrillation and who is on warfarin.  He presents for evaluation of an acute onset and severe tongue injury.  He bit his tongue today while eating and has not stopped using.  The injury occurred acutely a couple of hours ago.  A similar thing happened about 3 years ago and he required multiple sutures in the emergency department to get the wound to stop bleeding.  He is not having any trouble maintaining his airway, swallowing, nor breathing.  He does not have any significant amount of swelling.  The injury is to the left side of his tongue about midway back.  He is spitting a significant amount of blood.  Nothing in particular makes it better or worse.  He denies difficulty breathing, chest pain, nausea, vomiting, dizziness, lightheadedness.  He has been taking his medication as recommended and he just recently had his INR checked and it was appropriate, at about 2, according to his wife.  Past Medical History:  Diagnosis Date  . Arthritis   . Atrial fibrillation ([place])   . CAD (coronary artery disease)    6 STENTS  . CHF (congestive heart failure) ([place])   . Chicken pox   . Colon polyp   . Corneal dystrophy    bilateral  . Crohn's disease ([place])   . Diabetes ([place]) 2002  . Dysrhythmia    A-FIB AND PAF  . GERD (gastroesophageal reflux disease)   . Gout   . History of kidney stones   . History of shingles   . Hyperlipemia   . Hypertension   . Kidney stones   . Myocardial infarction ([place])    x4, last one in 2010  . Peripheral neuropathy   . Peripheral neuropathy   .  Peripheral neuropathy   . PUD (peptic ulcer disease)   . Renal artery stenosis ([place])   . Renal artery stenosis ([place])   . Salzmann's nodular dystrophy 2012  . Sleep apnea   . Spinal stenosis     Patient Active Problem List   Diagnosis Date Noted  . Hypokalemia 01/20/2018  . OSA (obstructive sleep apnea) 01/12/2018  . Osteoarthritis of right hip 12/13/2017  . Intraabdominal mass 11/23/2017  . Colonic mass 11/18/2017  . Crohn's disease ([place]) 10/20/2017  . Pulmonary nodule 10/07/2017  . History of skin cancer 10/07/2017  . Rib pain on right side 03/07/2017  . Upper respiratory tract infection 09/09/2016  . Osteoarthritis 04/08/2016  . Spinal stenosis, lumbar 03/25/2016  . DM type 2 with diabetic peripheral neuropathy ([place]) 07/15/2014  . Gout 05/04/2013  . Long term current use of anticoagulant 07/29/2011  . Renal artery stenosis ([place]) 07/01/2011  . Atrial fibrillation ([place]) 06/30/2011  . CAD (coronary artery disease) 06/30/2011  . Hyperlipidemia 06/30/2011  . Essential hypertension 06/30/2011    Past Surgical History:  Procedure Laterality Date  . ABLATION  2012  . APPLICATION VERTERBRAL DEFECT PROSTHETIC  05/01/2013  . ARTHRODESIS ANTERIOR LUMBAR SPINE  05/01/2013  . BACK SURGERY     lumbar fusion  . CARDIAC CATHETERIZATION    . CARDIAC ELECTROPHYSIOLOGY STUDY AND ABLATION    .  CARDIAC SURGERY    . CARDIOVERSION    . CHOLECYSTECTOMY    . COLONOSCOPY WITH PROPOFOL N/A 04/09/2016   Completed for chronic diarrhea.  Few scattered diverticuli.  No mucosal lesions.  Surgeon: Lollie Sails, MD;  Location: [facility] ENDOSCOPY;  Service: Endoscopy;  Laterality: N/A;  . CORNEAL EYE SURGERY Bilateral 07/28/11    09/22/2011  . CORONARY ANGIOPLASTY    . CORONARY ANGIOPLASTY WITH STENT PLACEMENT  03/28/2015   Distal 80% to normal Stent, Dilation Balloon  . CORONARY ARTERY BYPASS GRAFT     4 VESSELS  . EYE SURGERY     bilateral cataract  . foot and ankle repair Right 2005  . FRACTURE  SURGERY     ANKLE PLATE AND SCREWS  . GALLBLADER    . JOINT REPLACEMENT     left total hip 11/11/15  . JOINT REPLACEMENT     right total hip 12/2017 Dr. Rudene Christians   . KNEE ARTHROSCOPY Right   . LUMBAR SPINE FUSION ONE LEVEL  05/03/2013  . RENAL ARTERY STENT Right   . ROTATOR CUFF REPAIR Right 2006  . TONSILLECTOMY    . TOTAL HIP ARTHROPLASTY Left 11/11/2015   Procedure: TOTAL HIP ARTHROPLASTY ANTERIOR APPROACH;  Surgeon: Hessie Knows, MD;  Location: [facility] ORS;  Service: Orthopedics;  Laterality: Left;  . TOTAL HIP ARTHROPLASTY Right 12/13/2017   Procedure: TOTAL HIP ARTHROPLASTY ANTERIOR APPROACH;  Surgeon: Hessie Knows, MD;  Location: [facility] ORS;  Service: Orthopedics;  Laterality: Right;  . TRANSCATH PLACEMENT INTRAVASCULAR STENT LEG  03/2015    Prior to Admission medications   Medication Sig Start Date End Date Taking? Authorizing Provider  amLODipine (NORVASC) 10 MG tablet TAKE 1 TABLET BY MOUTH ONCE DAILY Patient taking differently: Take 10 mg by mouth at bedtime 09/16/17   McLean-Scocuzza, Nino Glow, MD  azelastine (ASTELIN) 0.1 % nasal spray Place 2 sprays into both nostrils 2 (two) times daily. Use in each nostril as directed Patient taking differently: Place 2 sprays into both nostrils daily. Use in each nostril as directed 09/09/16   Coral Spikes, DO  carvedilol (COREG) 25 MG tablet Take 1 tablet (25 mg total) by mouth 2 (two) times daily with a meal. 10/06/17   McLean-Scocuzza, Nino Glow, MD  cetirizine (ZYRTEC ALLERGY) 10 MG tablet Take 10 mg by mouth daily.    [provider]  Cholecalciferol 50000 units TABS Take 1 tablet by mouth once a week. Patient taking differently: Take 50,000 Units by mouth every Saturday.  10/07/17   McLean-Scocuzza, Nino Glow, MD  clopidogrel (PLAVIX) 75 MG tablet Take 75 mg by mouth daily.    [provider]  colchicine 0.6 MG tablet TAKE 1 TABLET BY MOUTH ONCE DAILY Patient taking differently: TAKE 0.6 MG BY MOUTH ONCE DAILY 07/12/17   Leone Haven, MD  diclofenac sodium (VOLTAREN) 1 % GEL Apply 2 g topically 4 (four) times daily as needed (for pain).     [provider]  dicyclomine (BENTYL) 10 MG capsule Take 10 mg by mouth 3 (three) times daily before meals.     [provider]  gabapentin (NEURONTIN) 300 MG capsule take 2 capsules (654m) by mouth three times a day 01/16/18   McLean-Scocuzza, TNino Glow MD  hydrALAZINE (APRESOLINE) 100 MG tablet TAKE 100 MG BY MOUTH THREE TIMES A DAY 12/29/17   McLean-Scocuzza, TNino Glow MD  HYDROcodone-acetaminophen (NORCO) 7.5-325 MG tablet Take 1-2 tablets by mouth every 4 (four) hours as needed for severe pain (pain  score 7-10). 12/16/17   Duanne Guess, PA-C  hydrocortisone 2.5 % cream Apply topically 2 (two) times daily. Patient taking differently: Apply 1 application topically daily as needed (rash behind ear).  10/03/17   McLean-Scocuzza, Nino Glow, MD  insulin lispro (HUMALOG) 100 UNIT/ML KiwkPen Inject 15-30 units SQ up to three times daily per sliding scale 05/03/16   [provider]  isosorbide mononitrate (IMDUR) 120 MG 24 hr tablet TAKE 1 TABLET BY MOUTH EVERY DAY Patient taking differently: TAKE 120 MG BY MOUTH EVERY DAY 10/07/17   McLean-Scocuzza, Nino Glow, MD  LEVEMIR FLEXTOUCH 100 UNIT/ML Pen Inject 80 Units into the skin daily at 10 pm. 01/19/18   McLean-Scocuzza, Nino Glow, MD  mesalamine (LIALDA) 1.2 g EC tablet Take 2.4 g by mouth daily with breakfast. [facility]    [provider]  metFORMIN (GLUCOPHAGE) 1000 MG tablet Take 1,000 mg by mouth 2 (two) times daily.  01/01/15   [provider]  Multiple Vitamin (MULTI-VITAMINS) TABS Take 1 tablet by mouth daily. 04/15/09   [provider]  nitroGLYCERIN (NITROSTAT) 0.4 MG SL tablet Place 1 tablet (0.4 mg total) under the tongue every 5 (five) minutes x 3 doses as needed. For chest pain.  Call 911 if no relief after 3 tablets 10/03/17 11/02/21  McLean-Scocuzza, Nino Glow, MD  omeprazole (PRILOSEC) 40 MG  capsule Take 40 mg by mouth 2 (two) times daily.     [provider]  pravastatin (PRAVACHOL) 40 MG tablet TAKE 1 TABLET BY MOUTH ONCE DAILY Patient taking differently: TAKE 40 MG BY MOUTH ONCE DAILY 10/07/17   McLean-Scocuzza, Nino Glow, MD  probenecid (BENEMID) 500 MG tablet Take 500 mg by mouth 2 (two) times daily.  01/09/15   [provider]  vitamin C (ASCORBIC ACID) 500 MG tablet Take 500 mg by mouth daily.     [provider]  Vitamin E 400 units TABS Take 400 Units by mouth 2 (two) times daily.    [provider]  warfarin (COUMADIN) 4 MG tablet Take 1 tablet (4 mg total) by mouth daily at 6 PM. Take 4 mg by mouth daily 01/19/18   McLean-Scocuzza, Nino Glow, MD    Allergies Allopurinol; Atenolol; Atorvastatin; Ramipril; Rosuvastatin; Simvastatin; Valsartan; and Cardizem [diltiazem]  Family History  Problem Relation Age of Onset  . CVA Father   . Stroke Father   . Lung cancer Mother   . Arthritis Mother   . Stomach cancer Sister   . Colon cancer Sister   . Diabetes Brother     Social History Social History   Tobacco Use  . Smoking status: Former Smoker    Packs/day: 1.00    Years: 3.00    Pack years: 3.00    Types: Cigarettes    Last attempt to quit: 06/30/1962    Years since quitting: 55.6  . Smokeless tobacco: Former Systems developer    Types: Chew    Quit date: 12/05/1968  Substance Use Topics  . Alcohol use: No  . Drug use: No    Review of Systems Constitutional: No fever/chills Eyes: No visual changes. ENT: Laceration to the left side of his tongue Cardiovascular: Denies chest pain. Respiratory: Denies shortness of breath. Gastrointestinal: No abdominal pain.  No nausea, no vomiting.     ____________________________________________   PHYSICAL EXAM:  VITAL SIGNS: ED Triage Vitals [01/27/18 2149]  Enc Vitals Group     BP (!) 165/63     Pulse Rate 64  Resp 18     Temp 98.7 F (37.1 C)     Temp Source Oral     SpO2 96 %      Weight 85.3 kg (188 lb)     Height 1.727 m (5' 8" )     Head Circumference      Peak Flow      Pain Score 0     Pain Loc      Pain Edu?      Excl. in [place]?     Constitutional: Alert and oriented. Well appearing and in no acute distress. Eyes: Conjunctivae are normal.  Head: Atraumatic. Nose: No congestion/rhinnorhea. Mouth/Throat: Mucous membranes are moist.  There is a large amount of both dried and fresh blood in his mouth.  The patient has an macerated area to the left side of his tongue about midway back.  There is a small blood clot but no laceration.  I was able to dislodge the blood clot and discovered what appears to be a puncture wound that started bleeding copiously rather than a laceration.  I repaired it as described in the procedure note, after which there was no more bleeding. Neck: No stridor.  No meningeal signs.   Cardiovascular: Normal rate, regular rhythm. Good peripheral circulation.  Respiratory: Normal respiratory effort.  No retractions.  Musculoskeletal: No lower extremity tenderness nor edema. No gross deformities of extremities. Neurologic:  Normal speech and language. No gross focal neurologic deficits are appreciated.  Skin:  Skin is warm, dry and intact. No rash noted. Psychiatric: Mood and affect are normal. Speech and behavior are normal.  ____________________________________________   LABS (all labs ordered are listed, but only abnormal results are displayed)  Labs Reviewed - No data to display ____________________________________________  EKG  None - EKG not ordered by ED physician ____________________________________________  RADIOLOGY   ED MD interpretation: No indication for imaging  Official radiology report(s): No results found.  ____________________________________________   PROCEDURES  Critical Care performed: No   Procedure(s) performed:   Marland KitchenMarland KitchenLaceration Repair Date/Time: 01/28/2018 3:48 AM Performed by: Hinda Kehr,  MD Authorized by: Hinda Kehr, MD   Consent:    Consent obtained:  Verbal   Consent given by:  Patient Anesthesia (see MAR for exact dosages):    Anesthesia method:  Local infiltration   Local anesthetic:  Lidocaine 1% w/o epi Laceration details:    Location:  Mouth   Mouth location:  Tongue, anterior 2/3   Length (cm):  0.5 Repair type:    Repair type:  Simple Exploration:    Wound exploration: entire depth of wound probed and visualized   Treatment:    Amount of cleaning:  Standard   Irrigation solution:  Tap water   Visualized foreign bodies/material removed: no   Skin repair:    Repair method:  Sutures   Suture size:  5-0   Wound skin closure material used: Vicryl Rapide.   Suture technique:  Figure eight and simple interrupted (1 simple interrupted which did not achieve hemostasis, 1 figure-of-eight which achieved hemostasis)   Number of sutures:  2 Approximation:    Approximation:  Close     ____________________________________________   INITIAL IMPRESSION / ASSESSMENT AND PLAN / ED COURSE  As part of my medical decision making, I reviewed the following data within the [place] notes reviewed and incorporated, reviewed prior medical record including prior ED visits    Patient had to relatively severe lacerations to his tongue 3 years ago they required  at least 10 sutures to repair.  Today he has more of a puncture wound but I could not achieve hemostasis until applying a figure of 8 suture.  After applying the suture it was no longer bleeding but to help make sure it does not start again I soaked some 4 x 4 gauze and TXA and applied it to the wound.  I will check on him later after 10 or 15 minutes to see if it is oozed or bled anymore.  He is comfortable with the plan.  There is no indication to recheck his INR since it was just checked as an outpatient follow-up.  He is maintaining his airway without difficulty.  Clinical Course as of  Jan 28 350  Sat Jan 28, 2018  0207 About 10 minutes after removing the TXAs-soaked gauze, the patient has no more bleeding or oozing.  Patient comfortable going home.  Reiterated soft diet and outpatient follow up plan.    I gave my usual and customary return precautions.    [CF]    Clinical Course User Index [CF] Hinda Kehr, MD    ____________________________________________  FINAL CLINICAL IMPRESSION(S) / ED DIAGNOSES  Final diagnoses:  Tongue laceration, initial encounter  Warfarin anticoagulation     MEDICATIONS GIVEN DURING THIS VISIT:  Medications  tranexamic acid (CYKLOKAPRON) injection 500 mg (500 mg Topical Given by Other 01/28/18 0153)  lidocaine (PF) (XYLOCAINE) 1 % injection (  Given by Other 01/28/18 0154)     ED Discharge Orders    None       Note:  This document was prepared using Dragon voice recognition software and may include unintentional dictation errors.    Hinda Kehr, MD 01/28/18 [phone]

## 2018-01-30 DIAGNOSIS — E1122 Type 2 diabetes mellitus with diabetic chronic kidney disease: Secondary | ICD-10-CM | POA: Diagnosis not present

## 2018-01-30 DIAGNOSIS — I129 Hypertensive chronic kidney disease with stage 1 through stage 4 chronic kidney disease, or unspecified chronic kidney disease: Secondary | ICD-10-CM | POA: Diagnosis not present

## 2018-01-30 DIAGNOSIS — Z471 Aftercare following joint replacement surgery: Secondary | ICD-10-CM | POA: Diagnosis not present

## 2018-01-30 DIAGNOSIS — N182 Chronic kidney disease, stage 2 (mild): Secondary | ICD-10-CM | POA: Diagnosis not present

## 2018-01-30 DIAGNOSIS — E1140 Type 2 diabetes mellitus with diabetic neuropathy, unspecified: Secondary | ICD-10-CM | POA: Diagnosis not present

## 2018-01-30 DIAGNOSIS — I480 Paroxysmal atrial fibrillation: Secondary | ICD-10-CM | POA: Diagnosis not present

## 2018-01-31 ENCOUNTER — Ambulatory Visit (INDEPENDENT_AMBULATORY_CARE_PROVIDER_SITE_OTHER): Payer: Medicare Other | Admitting: Urology

## 2018-01-31 ENCOUNTER — Encounter: Payer: Self-pay | Admitting: Urology

## 2018-01-31 VITALS — BP 110/58 | HR 69 | Ht 68.0 in | Wt 188.0 lb

## 2018-01-31 DIAGNOSIS — N401 Enlarged prostate with lower urinary tract symptoms: Secondary | ICD-10-CM

## 2018-01-31 DIAGNOSIS — N200 Calculus of kidney: Secondary | ICD-10-CM

## 2018-01-31 DIAGNOSIS — Z794 Long term (current) use of insulin: Secondary | ICD-10-CM | POA: Diagnosis not present

## 2018-01-31 DIAGNOSIS — R197 Diarrhea, unspecified: Secondary | ICD-10-CM | POA: Diagnosis not present

## 2018-01-31 DIAGNOSIS — R933 Abnormal findings on diagnostic imaging of other parts of digestive tract: Secondary | ICD-10-CM | POA: Diagnosis not present

## 2018-01-31 DIAGNOSIS — Z87448 Personal history of other diseases of urinary system: Secondary | ICD-10-CM | POA: Diagnosis not present

## 2018-01-31 DIAGNOSIS — E1122 Type 2 diabetes mellitus with diabetic chronic kidney disease: Secondary | ICD-10-CM | POA: Diagnosis not present

## 2018-01-31 DIAGNOSIS — N182 Chronic kidney disease, stage 2 (mild): Secondary | ICD-10-CM | POA: Diagnosis not present

## 2018-01-31 DIAGNOSIS — R399 Unspecified symptoms and signs involving the genitourinary system: Secondary | ICD-10-CM

## 2018-01-31 DIAGNOSIS — N138 Other obstructive and reflux uropathy: Secondary | ICD-10-CM | POA: Diagnosis not present

## 2018-01-31 DIAGNOSIS — E1142 Type 2 diabetes mellitus with diabetic polyneuropathy: Secondary | ICD-10-CM | POA: Diagnosis not present

## 2018-01-31 DIAGNOSIS — E1159 Type 2 diabetes mellitus with other circulatory complications: Secondary | ICD-10-CM | POA: Diagnosis not present

## 2018-01-31 DIAGNOSIS — E1129 Type 2 diabetes mellitus with other diabetic kidney complication: Secondary | ICD-10-CM | POA: Diagnosis not present

## 2018-01-31 DIAGNOSIS — R809 Proteinuria, unspecified: Secondary | ICD-10-CM | POA: Diagnosis not present

## 2018-01-31 LAB — URINALYSIS, COMPLETE
Bilirubin, UA: NEGATIVE
GLUCOSE, UA: NEGATIVE
Leukocytes, UA: NEGATIVE
NITRITE UA: NEGATIVE
RBC, UA: NEGATIVE
UUROB: 0.2 mg/dL (ref 0.2–1.0)
pH, UA: 5 (ref 5.0–7.5)

## 2018-01-31 LAB — MICROSCOPIC EXAMINATION
Epithelial Cells (non renal): NONE SEEN /hpf (ref 0–10)
RBC MICROSCOPIC, UA: NONE SEEN /HPF (ref 0–2)
WBC, UA: NONE SEEN /hpf (ref 0–5)

## 2018-01-31 MED ORDER — SILODOSIN 8 MG PO CAPS: 8.0000 mg | ORAL_CAPSULE | Freq: Every day | ORAL | 0 refills | Status: DC

## 2018-01-31 NOTE — Progress Notes (Signed)
   01/31/18  CC:  Chief Complaint  Patient presents with  . Cysto    HPI: 72 year old male with mixed lower storage/emptying urinary tract symptoms refractory to multiple medications including tamsulosin, anticholinergics and beta 3 agonist.  See Shannon's previous dictation of 11/08/2017  Blood pressure (!) 110/58, pulse 69, height 5' 8"  (1.727 m), weight 188 lb (85.3 kg), SpO2 99 %. NED. A&Ox3.   No respiratory distress   Abd soft, NT, ND Normal phallus with bilateral descended testicles  Cystoscopy Procedure Note  Patient identification was confirmed, informed consent was obtained, and patient was prepped using Betadine solution.  Lidocaine jelly was administered per urethral meatus.    Preoperative abx where received prior to procedure.     Pre-Procedure: - Inspection reveals a normal caliber ureteral meatus.  Procedure: The flexible cystoscope was introduced without difficulty - No urethral strictures/lesions are present. - Touching lateral lobes with moderate median lobe prostate  - Moderate bladder neck elevation - Bilateral ureteral orifices identified - Bladder mucosa  reveals no ulcers, tumors, or lesions - No bladder stones -Mild trabeculation  Retroflexion shows small intravesical median lobe   Post-Procedure: - Patient tolerated the procedure well  Assessment/ Plan: Cystoscopy remarkable for prostate enlargement.  He apparently has been on tamsulosin in the past but states he is not been on medication for his prostate.  Will give a trial of silodosin and he will follow-up in approximately 4 weeks for recheck.  If no improvement and he desires additional therapy would recommend scheduling urodynamics.

## 2018-02-01 ENCOUNTER — Other Ambulatory Visit: Payer: Medicare Other | Admitting: Urology

## 2018-02-03 DIAGNOSIS — Z471 Aftercare following joint replacement surgery: Secondary | ICD-10-CM | POA: Diagnosis not present

## 2018-02-03 DIAGNOSIS — E1140 Type 2 diabetes mellitus with diabetic neuropathy, unspecified: Secondary | ICD-10-CM | POA: Diagnosis not present

## 2018-02-03 DIAGNOSIS — N182 Chronic kidney disease, stage 2 (mild): Secondary | ICD-10-CM | POA: Diagnosis not present

## 2018-02-03 DIAGNOSIS — I480 Paroxysmal atrial fibrillation: Secondary | ICD-10-CM | POA: Diagnosis not present

## 2018-02-03 DIAGNOSIS — E1122 Type 2 diabetes mellitus with diabetic chronic kidney disease: Secondary | ICD-10-CM | POA: Diagnosis not present

## 2018-02-03 DIAGNOSIS — I129 Hypertensive chronic kidney disease with stage 1 through stage 4 chronic kidney disease, or unspecified chronic kidney disease: Secondary | ICD-10-CM | POA: Diagnosis not present

## 2018-02-07 DIAGNOSIS — E1140 Type 2 diabetes mellitus with diabetic neuropathy, unspecified: Secondary | ICD-10-CM | POA: Diagnosis not present

## 2018-02-07 DIAGNOSIS — I129 Hypertensive chronic kidney disease with stage 1 through stage 4 chronic kidney disease, or unspecified chronic kidney disease: Secondary | ICD-10-CM | POA: Diagnosis not present

## 2018-02-07 DIAGNOSIS — I480 Paroxysmal atrial fibrillation: Secondary | ICD-10-CM | POA: Diagnosis not present

## 2018-02-07 DIAGNOSIS — N182 Chronic kidney disease, stage 2 (mild): Secondary | ICD-10-CM | POA: Diagnosis not present

## 2018-02-07 DIAGNOSIS — E1122 Type 2 diabetes mellitus with diabetic chronic kidney disease: Secondary | ICD-10-CM | POA: Diagnosis not present

## 2018-02-07 DIAGNOSIS — Z471 Aftercare following joint replacement surgery: Secondary | ICD-10-CM | POA: Diagnosis not present

## 2018-02-09 ENCOUNTER — Emergency Department: Payer: Medicare Other

## 2018-02-09 ENCOUNTER — Ambulatory Visit (INDEPENDENT_AMBULATORY_CARE_PROVIDER_SITE_OTHER): Payer: Medicare Other | Admitting: Internal Medicine

## 2018-02-09 ENCOUNTER — Other Ambulatory Visit: Payer: Self-pay

## 2018-02-09 ENCOUNTER — Observation Stay
Admission: EM | Admit: 2018-02-09 | Discharge: 2018-02-11 | Disposition: A | Discharge status: Home / Self Care | Payer: Medicare Other | Attending: Internal Medicine | Admitting: Internal Medicine

## 2018-02-09 ENCOUNTER — Encounter: Payer: Self-pay | Admitting: Internal Medicine

## 2018-02-09 ENCOUNTER — Encounter: Payer: Self-pay | Admitting: Emergency Medicine

## 2018-02-09 VITALS — BP 142/66 | HR 66 | Temp 99.2°F | Ht 68.0 in

## 2018-02-09 DIAGNOSIS — Z7902 Long term (current) use of antithrombotics/antiplatelets: Secondary | ICD-10-CM | POA: Insufficient documentation

## 2018-02-09 DIAGNOSIS — I509 Heart failure, unspecified: Secondary | ICD-10-CM | POA: Diagnosis not present

## 2018-02-09 DIAGNOSIS — R400 Somnolence: Secondary | ICD-10-CM | POA: Diagnosis not present

## 2018-02-09 DIAGNOSIS — I4891 Unspecified atrial fibrillation: Secondary | ICD-10-CM | POA: Insufficient documentation

## 2018-02-09 DIAGNOSIS — Z79899 Other long term (current) drug therapy: Secondary | ICD-10-CM | POA: Insufficient documentation

## 2018-02-09 DIAGNOSIS — N182 Chronic kidney disease, stage 2 (mild): Secondary | ICD-10-CM | POA: Diagnosis not present

## 2018-02-09 DIAGNOSIS — H109 Unspecified conjunctivitis: Secondary | ICD-10-CM | POA: Diagnosis not present

## 2018-02-09 DIAGNOSIS — R4182 Altered mental status, unspecified: Secondary | ICD-10-CM | POA: Insufficient documentation

## 2018-02-09 DIAGNOSIS — E785 Hyperlipidemia, unspecified: Secondary | ICD-10-CM | POA: Insufficient documentation

## 2018-02-09 DIAGNOSIS — I10 Essential (primary) hypertension: Secondary | ICD-10-CM | POA: Diagnosis not present

## 2018-02-09 DIAGNOSIS — I110 Hypertensive heart disease with heart failure: Secondary | ICD-10-CM | POA: Diagnosis not present

## 2018-02-09 DIAGNOSIS — J329 Chronic sinusitis, unspecified: Secondary | ICD-10-CM | POA: Diagnosis not present

## 2018-02-09 DIAGNOSIS — K219 Gastro-esophageal reflux disease without esophagitis: Secondary | ICD-10-CM | POA: Insufficient documentation

## 2018-02-09 DIAGNOSIS — Z7901 Long term (current) use of anticoagulants: Secondary | ICD-10-CM | POA: Diagnosis not present

## 2018-02-09 DIAGNOSIS — G4733 Obstructive sleep apnea (adult) (pediatric): Secondary | ICD-10-CM

## 2018-02-09 DIAGNOSIS — I480 Paroxysmal atrial fibrillation: Secondary | ICD-10-CM | POA: Diagnosis not present

## 2018-02-09 DIAGNOSIS — J019 Acute sinusitis, unspecified: Principal | ICD-10-CM | POA: Insufficient documentation

## 2018-02-09 DIAGNOSIS — Z7982 Long term (current) use of aspirin: Secondary | ICD-10-CM | POA: Insufficient documentation

## 2018-02-09 DIAGNOSIS — I252 Old myocardial infarction: Secondary | ICD-10-CM | POA: Insufficient documentation

## 2018-02-09 DIAGNOSIS — Z951 Presence of aortocoronary bypass graft: Secondary | ICD-10-CM | POA: Insufficient documentation

## 2018-02-09 DIAGNOSIS — Z955 Presence of coronary angioplasty implant and graft: Secondary | ICD-10-CM | POA: Insufficient documentation

## 2018-02-09 DIAGNOSIS — J309 Allergic rhinitis, unspecified: Secondary | ICD-10-CM | POA: Diagnosis not present

## 2018-02-09 DIAGNOSIS — M109 Gout, unspecified: Secondary | ICD-10-CM | POA: Insufficient documentation

## 2018-02-09 DIAGNOSIS — Z471 Aftercare following joint replacement surgery: Secondary | ICD-10-CM | POA: Diagnosis not present

## 2018-02-09 DIAGNOSIS — N400 Enlarged prostate without lower urinary tract symptoms: Secondary | ICD-10-CM | POA: Insufficient documentation

## 2018-02-09 DIAGNOSIS — Z96643 Presence of artificial hip joint, bilateral: Secondary | ICD-10-CM | POA: Diagnosis not present

## 2018-02-09 DIAGNOSIS — I2510 Atherosclerotic heart disease of native coronary artery without angina pectoris: Secondary | ICD-10-CM | POA: Insufficient documentation

## 2018-02-09 DIAGNOSIS — I129 Hypertensive chronic kidney disease with stage 1 through stage 4 chronic kidney disease, or unspecified chronic kidney disease: Secondary | ICD-10-CM | POA: Diagnosis not present

## 2018-02-09 DIAGNOSIS — E1142 Type 2 diabetes mellitus with diabetic polyneuropathy: Secondary | ICD-10-CM | POA: Insufficient documentation

## 2018-02-09 DIAGNOSIS — Z794 Long term (current) use of insulin: Secondary | ICD-10-CM | POA: Insufficient documentation

## 2018-02-09 DIAGNOSIS — E1122 Type 2 diabetes mellitus with diabetic chronic kidney disease: Secondary | ICD-10-CM | POA: Diagnosis not present

## 2018-02-09 DIAGNOSIS — E1140 Type 2 diabetes mellitus with diabetic neuropathy, unspecified: Secondary | ICD-10-CM | POA: Diagnosis not present

## 2018-02-09 DIAGNOSIS — R05 Cough: Secondary | ICD-10-CM | POA: Diagnosis not present

## 2018-02-09 LAB — COMPREHENSIVE METABOLIC PANEL
ALT: 17 U/L (ref 17–63)
AST: 25 U/L (ref 15–41)
Albumin: 3.8 g/dL (ref 3.5–5.0)
Alkaline Phosphatase: 88 U/L (ref 38–126)
Anion gap: 12 (ref 5–15)
BUN: 17 mg/dL (ref 6–20)
CHLORIDE: 99 mmol/L — AB (ref 101–111)
CO2: 23 mmol/L (ref 22–32)
CREATININE: 0.86 mg/dL (ref 0.61–1.24)
Calcium: 9.5 mg/dL (ref 8.9–10.3)
GFR calc Af Amer: >60 mL/min (ref >60)
GFR calc non Af Amer: >60 mL/min (ref >60)
GLUCOSE: 141 mg/dL — AB (ref 65–99)
POTASSIUM: 4 mmol/L (ref 3.5–5.1)
Sodium: 134 mmol/L — ABNORMAL LOW (ref 135–145)
Total Bilirubin: 0.7 mg/dL (ref 0.3–1.2)
Total Protein: 8.4 g/dL — ABNORMAL HIGH (ref 6.5–8.1)

## 2018-02-09 LAB — CBC WITH DIFFERENTIAL/PLATELET
BASOS ABS: 0.1 10*3/uL (ref 0–0.1)
Basophils Relative: 1 %
EOS PCT: 8 %
Eosinophils Absolute: 0.8 10*3/uL — ABNORMAL HIGH (ref 0–0.7)
HEMATOCRIT: 37.4 % — AB (ref 40.0–52.0)
Hemoglobin: 12.9 g/dL — ABNORMAL LOW (ref 13.0–18.0)
LYMPHS ABS: 1.5 10*3/uL (ref 1.0–3.6)
LYMPHS PCT: 15 %
MCH: 32.9 pg (ref 26.0–34.0)
MCHC: 34.4 g/dL (ref 32.0–36.0)
MCV: 95.9 fL (ref 80.0–100.0)
MONO ABS: 1.2 10*3/uL — AB (ref 0.2–1.0)
MONOS PCT: 12 %
NEUTROS ABS: 6.4 10*3/uL (ref 1.4–6.5)
Neutrophils Relative %: 64 %
Platelets: 283 10*3/uL (ref 150–440)
RBC: 3.9 MIL/uL — ABNORMAL LOW (ref 4.40–5.90)
RDW: 15 % — AB (ref 11.5–14.5)
WBC: 9.9 10*3/uL (ref 3.8–10.6)

## 2018-02-09 LAB — PROTIME-INR
INR: 1.98
Prothrombin Time: 22.3 seconds — ABNORMAL HIGH (ref 11.4–15.2)

## 2018-02-09 LAB — GLUCOSE, CAPILLARY
GLUCOSE-CAPILLARY: 155 mg/dL — AB (ref 65–99)
Glucose-Capillary: 240 mg/dL — ABNORMAL HIGH (ref 65–99)

## 2018-02-09 LAB — LACTIC ACID, PLASMA: Lactic Acid, Venous: 1.7 mmol/L (ref 0.5–1.9)

## 2018-02-09 MED ORDER — MONTELUKAST SODIUM 10 MG PO TABS
10.0000 mg | ORAL_TABLET | Freq: Every day | ORAL | Status: DC
  Administered 2018-02-09 – 2018-02-10 (×2): 10 mg via ORAL
  Filled 2018-02-09 (×2): qty 1

## 2018-02-09 MED ORDER — HYDROCODONE-ACETAMINOPHEN 7.5-325 MG PO TABS: 1.0000 | ORAL_TABLET | ORAL | Status: DC | PRN

## 2018-02-09 MED ORDER — COLCHICINE 0.6 MG PO TABS
0.6000 mg | ORAL_TABLET | Freq: Every day | ORAL | Status: DC
  Administered 2018-02-10 – 2018-02-11 (×2): 0.6 mg via ORAL
  Filled 2018-02-09 (×2): qty 1

## 2018-02-09 MED ORDER — CLOPIDOGREL BISULFATE 75 MG PO TABS
75.0000 mg | ORAL_TABLET | Freq: Every day | ORAL | Status: DC
  Administered 2018-02-10 – 2018-02-11 (×2): 75 mg via ORAL
  Filled 2018-02-09 (×2): qty 1

## 2018-02-09 MED ORDER — HYDRALAZINE HCL 50 MG PO TABS
100.0000 mg | ORAL_TABLET | Freq: Three times a day (TID) | ORAL | Status: DC
  Administered 2018-02-09 – 2018-02-11 (×6): 100 mg via ORAL
  Filled 2018-02-09 (×6): qty 2

## 2018-02-09 MED ORDER — VITAMIN D (ERGOCALCIFEROL) 1.25 MG (50000 UNIT) PO CAPS
50000.0000 [IU] | ORAL_CAPSULE | ORAL | Status: DC
  Administered 2018-02-11: 10:00:00 50000 [IU] via ORAL
  Filled 2018-02-09: qty 1

## 2018-02-09 MED ORDER — ACETAMINOPHEN 650 MG RE SUPP: 650.0000 mg | Freq: Four times a day (QID) | RECTAL | Status: DC | PRN

## 2018-02-09 MED ORDER — CETIRIZINE HCL 10 MG PO TABS
10.0000 mg | ORAL_TABLET | Freq: Every day | ORAL | Status: DC
Start: 2018-02-10 — End: 2018-02-11
  Administered 2018-02-10 – 2018-02-11 (×2): 10 mg via ORAL
  Filled 2018-02-09 (×2): qty 1

## 2018-02-09 MED ORDER — ADULT MULTIVITAMIN W/MINERALS CH
1.0000 | ORAL_TABLET | Freq: Every day | ORAL | Status: DC
  Administered 2018-02-10 – 2018-02-11 (×2): 1 via ORAL
  Filled 2018-02-09 (×2): qty 1

## 2018-02-09 MED ORDER — TAMSULOSIN HCL 0.4 MG PO CAPS
0.4000 mg | ORAL_CAPSULE | Freq: Every day | ORAL | Status: DC
  Filled 2018-02-09: qty 1

## 2018-02-09 MED ORDER — AMLODIPINE BESYLATE 10 MG PO TABS
10.0000 mg | ORAL_TABLET | Freq: Every day | ORAL | Status: DC
  Administered 2018-02-09 – 2018-02-10 (×2): 10 mg via ORAL
  Filled 2018-02-09 (×2): qty 1

## 2018-02-09 MED ORDER — LEVOCETIRIZINE DIHYDROCHLORIDE 5 MG PO TABS: 5.0000 mg | ORAL_TABLET | Freq: Every evening | ORAL | Status: AC

## 2018-02-09 MED ORDER — ACETAMINOPHEN 325 MG PO TABS: 650.0000 mg | ORAL_TABLET | Freq: Four times a day (QID) | ORAL | Status: DC | PRN

## 2018-02-09 MED ORDER — MONTELUKAST SODIUM 10 MG PO TABS: 10.0000 mg | ORAL_TABLET | Freq: Every day | ORAL | 0 refills | Status: DC

## 2018-02-09 MED ORDER — PANTOPRAZOLE SODIUM 40 MG PO TBEC
40.0000 mg | DELAYED_RELEASE_TABLET | Freq: Every day | ORAL | Status: DC
  Filled 2018-02-09 (×2): qty 1

## 2018-02-09 MED ORDER — GABAPENTIN 300 MG PO CAPS
300.0000 mg | ORAL_CAPSULE | Freq: Three times a day (TID) | ORAL | Status: DC
  Administered 2018-02-09 – 2018-02-11 (×6): 300 mg via ORAL
  Filled 2018-02-09 (×6): qty 1

## 2018-02-09 MED ORDER — DICYCLOMINE HCL 10 MG PO CAPS
10.0000 mg | ORAL_CAPSULE | Freq: Three times a day (TID) | ORAL | Status: DC
  Administered 2018-02-10 – 2018-02-11 (×6): 10 mg via ORAL
  Filled 2018-02-09 (×6): qty 1

## 2018-02-09 MED ORDER — PRAVASTATIN SODIUM 20 MG PO TABS
40.0000 mg | ORAL_TABLET | Freq: Every day | ORAL | Status: DC
  Administered 2018-02-10 – 2018-02-11 (×2): 40 mg via ORAL
  Filled 2018-02-09 (×3): qty 2

## 2018-02-09 MED ORDER — ONDANSETRON HCL 4 MG PO TABS: 4.0000 mg | ORAL_TABLET | Freq: Four times a day (QID) | ORAL | Status: DC | PRN

## 2018-02-09 MED ORDER — CARVEDILOL 25 MG PO TABS
25.0000 mg | ORAL_TABLET | Freq: Two times a day (BID) | ORAL | Status: DC
  Administered 2018-02-10 – 2018-02-11 (×4): 25 mg via ORAL
  Filled 2018-02-09 (×4): qty 1

## 2018-02-09 MED ORDER — ONDANSETRON HCL 4 MG/2ML IJ SOLN: 4.0000 mg | Freq: Four times a day (QID) | INTRAMUSCULAR | Status: DC | PRN

## 2018-02-09 MED ORDER — INSULIN DETEMIR 100 UNIT/ML ~~LOC~~ SOLN
60.0000 [IU] | Freq: Every day | SUBCUTANEOUS | Status: DC
  Administered 2018-02-09 – 2018-02-10 (×2): 60 [IU] via SUBCUTANEOUS
  Filled 2018-02-09 (×3): qty 0.6

## 2018-02-09 MED ORDER — ISOSORBIDE MONONITRATE ER 30 MG PO TB24
120.0000 mg | ORAL_TABLET | Freq: Every day | ORAL | Status: DC
  Administered 2018-02-10 – 2018-02-11 (×2): 120 mg via ORAL
  Filled 2018-02-09 (×2): qty 4

## 2018-02-09 MED ORDER — WARFARIN SODIUM 4 MG PO TABS
4.0000 mg | ORAL_TABLET | Freq: Every day | ORAL | Status: DC
  Administered 2018-02-09 – 2018-02-11 (×3): 4 mg via ORAL
  Filled 2018-02-09 (×3): qty 1

## 2018-02-09 MED ORDER — PROBENECID 500 MG PO TABS
500.0000 mg | ORAL_TABLET | Freq: Two times a day (BID) | ORAL | Status: DC
  Administered 2018-02-09 – 2018-02-11 (×4): 500 mg via ORAL
  Filled 2018-02-09 (×5): qty 1

## 2018-02-09 MED ORDER — VITAMIN E 180 MG (400 UNIT) PO CAPS
400.0000 [IU] | ORAL_CAPSULE | Freq: Two times a day (BID) | ORAL | Status: DC
  Administered 2018-02-09 – 2018-02-11 (×4): 400 [IU] via ORAL
  Filled 2018-02-09 (×5): qty 1

## 2018-02-09 MED ORDER — CHOLESTYRAMINE LIGHT 4 G PO PACK
4.0000 g | PACK | ORAL | Status: DC
  Administered 2018-02-10 – 2018-02-11 (×2): 4 g via ORAL
  Filled 2018-02-09 (×2): qty 1

## 2018-02-09 MED ORDER — WARFARIN - PHYSICIAN DOSING INPATIENT: Freq: Every day | Status: DC

## 2018-02-09 MED ORDER — DOCUSATE SODIUM 100 MG PO CAPS
100.0000 mg | ORAL_CAPSULE | Freq: Two times a day (BID) | ORAL | Status: DC
  Administered 2018-02-11: 100 mg via ORAL
  Filled 2018-02-09 (×3): qty 1

## 2018-02-09 MED ORDER — LEVOCETIRIZINE DIHYDROCHLORIDE 5 MG PO TABS: 5.0000 mg | ORAL_TABLET | Freq: Every evening | ORAL | Status: DC

## 2018-02-09 MED ORDER — INSULIN ASPART 100 UNIT/ML ~~LOC~~ SOLN
0.0000 [IU] | Freq: Three times a day (TID) | SUBCUTANEOUS | Status: DC
  Administered 2018-02-10: 13:00:00 11 [IU] via SUBCUTANEOUS
  Administered 2018-02-10 – 2018-02-11 (×3): 4 [IU] via SUBCUTANEOUS
  Administered 2018-02-11 (×2): 7 [IU] via SUBCUTANEOUS
  Filled 2018-02-09 (×6): qty 1

## 2018-02-09 MED ORDER — IPRATROPIUM BROMIDE 0.06 % NA SOLN
2.0000 | Freq: Four times a day (QID) | NASAL | Status: DC
Start: 2018-02-09 — End: 2018-02-11
  Administered 2018-02-10 – 2018-02-11 (×7): 2 via NASAL
  Filled 2018-02-09: qty 15

## 2018-02-09 MED ORDER — DICLOFENAC SODIUM 1 % TD GEL
2.0000 g | Freq: Four times a day (QID) | TRANSDERMAL | Status: DC | PRN
Start: 2018-02-09 — End: 2018-02-11
  Filled 2018-02-09: qty 100

## 2018-02-09 MED ORDER — VITAMIN C 500 MG PO TABS
500.0000 mg | ORAL_TABLET | Freq: Every day | ORAL | Status: DC
  Administered 2018-02-10 – 2018-02-11 (×2): 500 mg via ORAL
  Filled 2018-02-09 (×2): qty 1

## 2018-02-09 MED ORDER — NITROGLYCERIN 0.4 MG SL SUBL: 0.4000 mg | SUBLINGUAL_TABLET | SUBLINGUAL | Status: DC | PRN

## 2018-02-09 MED ORDER — HYDROCORTISONE 2.5 % EX CREA
TOPICAL_CREAM | Freq: Two times a day (BID) | CUTANEOUS | Status: DC | PRN
  Filled 2018-02-09: qty 30

## 2018-02-09 MED ORDER — ENOXAPARIN SODIUM 40 MG/0.4ML ~~LOC~~ SOLN
40.0000 mg | SUBCUTANEOUS | Status: DC
  Administered 2018-02-09 – 2018-02-10 (×2): 40 mg via SUBCUTANEOUS
  Filled 2018-02-09 (×2): qty 0.4

## 2018-02-09 MED ORDER — IPRATROPIUM BROMIDE 0.06 % NA SOLN: 2.0000 | Freq: Four times a day (QID) | NASAL | 12 refills | Status: DC

## 2018-02-09 NOTE — Progress Notes (Signed)
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 [address] [place], [place] [address]   Protime-INR     Status: None   Collection Time: 12/15/17  7:28 AM  Result Value Ref Range   Prothrombin Time 13.6 11.4 - 15.2 seconds   INR 1.05     Comment: Performed at [facility],  [address] [place], [place] [address]  Procalcitonin     Status: None   Collection Time: 12/15/17  7:28 AM  Result Value Ref Range   Procalcitonin 0.23 ng/mL    Comment:        Interpretation: PCT (Procalcitonin) <= 0.5 ng/mL: Systemic infection (sepsis) is not likely. Local bacterial infection is possible. (NOTE)       Sepsis PCT Algorithm           Lower Respiratory Tract                                      Infection PCT Algorithm    ----------------------------     ----------------------------         PCT < 0.25 ng/mL                PCT < 0.10 ng/mL         Strongly encourage             Strongly discourage   discontinuation of antibiotics    initiation of antibiotics    ----------------------------     -----------------------------       PCT 0.25 - 0.50 ng/mL            PCT 0.10 - 0.25 ng/mL               OR       >80% decrease in PCT            Discourage initiation of                                            antibiotics      Encourage discontinuation           of antibiotics    ----------------------------     -----------------------------         PCT >= 0.50 ng/mL              PCT 0.26 - 0.50 ng/mL               AND        <80% decrease in PCT  Encourage initiation of                                             antibiotics       Encourage continuation           of antibiotics    ----------------------------     -----------------------------        PCT >= 0.50 ng/mL                  PCT > 0.50 ng/mL               AND         increase in PCT                  Strongly encourage                                      initiation of antibiotics    Strongly encourage escalation           of antibiotics                                     -----------------------------                                           PCT <= 0.25 ng/mL                                                 OR                                        > 80% decrease in PCT                                      Discontinue / Do not initiate                                             antibiotics Performed at [facility], [address] [place], [place] [address]   Glucose, capillary     Status: Abnormal   Collection Time: 12/15/17  8:22 AM  Result Value Ref Range   Glucose-Capillary 212 (H) 65 - 99 mg/dL  Glucose, capillary     Status: Abnormal   Collection Time: 12/15/17 11:41 AM  Result Value Ref Range   Glucose-Capillary 232 (H) 65 - 99 mg/dL  Glucose, capillary     Status: Abnormal   Collection Time: 12/15/17  5:43 PM  Result Value Ref Range   Glucose-Capillary 237 (H) 65 - 99 mg/dL  Glucose, capillary     Status: Abnormal   Collection Time: 12/15/17  9:38 PM  Result Value  Ref Range   Glucose-Capillary 247 (H) 65 - 99 mg/dL   Comment 1 Notify RN   Protime-INR     Status: Abnormal   Collection Time: 12/16/17  4:40 AM  Result Value Ref Range   Prothrombin Time 15.3 (H) 11.4 - 15.2 seconds   INR 1.22     Comment: Performed at [facility], [address] [place], [place] [address]  Procalcitonin     Status: None   Collection Time: 12/16/17  4:40 AM  Result Value Ref Range   Procalcitonin 0.36 ng/mL    Comment:        Interpretation: PCT (Procalcitonin) <= 0.5 ng/mL: Systemic infection (sepsis) is not likely. Local bacterial infection is possible. (NOTE)       Sepsis PCT Algorithm           Lower Respiratory Tract                                      Infection PCT Algorithm    ----------------------------     ----------------------------         PCT < 0.25 ng/mL                PCT < 0.10 ng/mL         Strongly encourage             Strongly discourage   discontinuation of antibiotics    initiation of antibiotics    ----------------------------     -----------------------------       PCT 0.25 - 0.50 ng/mL            PCT 0.10 - 0.25 ng/mL               OR       >80% decrease in PCT            Discourage initiation of                                             antibiotics      Encourage discontinuation           of antibiotics    ----------------------------     -----------------------------         PCT >= 0.50 ng/mL              PCT 0.26 - 0.50 ng/mL               AND        <80% decrease in PCT             Encourage initiation of                                             antibiotics       Encourage continuation           of antibiotics    ----------------------------     -----------------------------        PCT >= 0.50 ng/mL                  PCT > 0.50 ng/mL  AND         increase in PCT                  Strongly encourage                                      initiation of antibiotics    Strongly encourage escalation           of antibiotics                                     -----------------------------                                           PCT <= 0.25 ng/mL                                                 OR                                        > 80% decrease in PCT                                     Discontinue / Do not initiate                                             antibiotics Performed at [facility], [address] [place], [place] [address]   CBC with Differential/Platelet     Status: Abnormal   Collection Time: 12/16/17  4:40 AM  Result Value Ref Range   WBC 10.4 3.8 - 10.6 K/uL   RBC 2.80 (L) 4.40 - 5.90 MIL/uL   Hemoglobin 9.6 (L) 13.0 - 18.0 g/dL   HCT 27.5 (L) 40.0 - 52.0 %   MCV 98.5 80.0 - 100.0 fL   MCH 34.2 (H) 26.0 - 34.0 pg   MCHC 34.7 32.0 - 36.0 g/dL   RDW 13.4 11.5 - 14.5 %   Platelets 190 150 - 440 K/uL   Neutrophils Relative % 73 %   Neutro Abs 7.6 (H) 1.4 - 6.5 K/uL   Lymphocytes Relative 10 %   Lymphs Abs 1.1 1.0 - 3.6 K/uL   Monocytes Relative 13 %   Monocytes Absolute 1.3 (H) 0.2 - 1.0 K/uL   Eosinophils Relative 4 %   Eosinophils Absolute 0.4 0 - 0.7 K/uL   Basophils Relative 0 %   Basophils Absolute 0.0 0 - 0.1 K/uL    Comment:  Performed at [facility], [address], [place], [place] [address]  Basic metabolic panel     Status: Abnormal   Collection Time: 12/16/17  4:40 AM  Result Value Ref Range   Sodium 132 (L) 135 - 145 mmol/L   Potassium 3.5 3.5 - 5.1 mmol/L  Chloride 96 (L) 101 - 111 mmol/L   CO2 28 22 - 32 mmol/L   Glucose, Bld 208 (H) 65 - 99 mg/dL   BUN 15 6 - 20 mg/dL   Creatinine, Ser 0.96 0.61 - 1.24 mg/dL   Calcium 8.0 (L) 8.9 - 10.3 mg/dL   GFR calc non Af Amer >60 >60 mL/min   GFR calc Af Amer >60 >60 mL/min    Comment: (NOTE) The eGFR has been calculated using the CKD EPI equation. This calculation has not been validated in all clinical situations. eGFR's persistently <60 mL/min signify possible Chronic Kidney Disease.    Anion gap 8 5 - 15    Comment: Performed at [facility], [address] [place], [place] [address]  Glucose, capillary     Status: Abnormal   Collection Time: 12/16/17  7:45 AM  Result Value Ref Range   Glucose-Capillary 210 (H) 65 - 99 mg/dL   Comment 1 Notify RN   ECHOCARDIOGRAM COMPLETE     Status: None   Collection Time: 12/16/17  9:47 AM  Result Value Ref Range   Weight 3,326.3 oz   Height 68 in   BP 139/56 mmHg  Glucose, capillary     Status: Abnormal   Collection Time: 12/16/17 12:20 PM  Result Value Ref Range   Glucose-Capillary 225 (H) 65 - 99 mg/dL   Comment 1 Notify RN   Glucose, capillary     Status: Abnormal   Collection Time: 12/16/17  4:34 PM  Result Value Ref Range   Glucose-Capillary 194 (H) 65 - 99 mg/dL   Comment 1 Notify RN   Glucose, capillary     Status: Abnormal   Collection Time: 12/16/17  9:11 PM  Result Value Ref Range   Glucose-Capillary 182 (H) 65 - 99 mg/dL   Comment 1 Notify RN   Protime-INR     Status: Abnormal   Collection Time: 12/17/17  3:24 AM  Result Value Ref Range   Prothrombin Time 15.6 (H) 11.4 - 15.2 seconds   INR 1.25     Comment: Performed at [facility], [address]  [place], [place] [address]  Glucose, capillary     Status: Abnormal   Collection Time: 12/17/17  7:35 AM  Result Value Ref Range   Glucose-Capillary 186 (H) 65 - 99 mg/dL   Comment 1 Notify RN   Glucose, capillary     Status: Abnormal   Collection Time: 12/17/17 11:46 AM  Result Value Ref Range   Glucose-Capillary 232 (H) 65 - 99 mg/dL   Comment 1 Notify RN   VITAMIN D 25 Hydroxy (Vit-D Deficiency, Fractures)     Status: None   Collection Time: 01/12/18  4:23 PM  Result Value Ref Range   Vit D, 25-Hydroxy 37.8 30.0 - 100.0 ng/mL    Comment: (NOTE) Vitamin D deficiency has been defined by the Institute of Medicine and an Endocrine Society practice guideline as a level of serum 25-OH vitamin D less than 20 ng/mL (1,2). The Endocrine Society went on to further define vitamin D insufficiency as a level between 21 and 29 ng/mL (2). 1. IOM (Institute of Medicine). 2010. Dietary reference   intakes for calcium and D. [place]: The   [employer]. 2. Holick MF, Binkley [place], Bischoff-Ferrari HA, et al.   Evaluation, treatment, and prevention of vitamin D   deficiency: an Endocrine Society clinical practice   guideline. JCEM. 2011 Jul; 96(7):1911-30. Performed At: [facility] [address]  [address] [place], [place] [id number] Rush Farmer MD IP:[id number] Performed at [facility], [address] [place], [place] [address]   Hemoglobin A1c     Status: Abnormal   Collection Time: 01/12/18  4:23 PM  Result Value Ref Range   Hgb A1c MFr Bld 5.8 (H) 4.8 - 5.6 %    Comment: (NOTE) Pre diabetes:          5.7%-6.4% Diabetes:              >6.4% Glycemic control for   <7.0% adults with diabetes    Mean Plasma Glucose 119.76 mg/dL    Comment: Performed at [facility] [address], [place], [place] [address]  Protime-INR     Status: Abnormal   Collection Time: 01/12/18  4:23 PM  Result Value Ref Range   Prothrombin Time 18.3 (H) 11.4 - 15.2 seconds   INR  1.53     Comment: Performed at [facility], [address] [place], [place] [address]  CBC with Differential/Platelet     Status: Abnormal   Collection Time: 01/12/18  4:23 PM  Result Value Ref Range   WBC 7.1 3.8 - 10.6 K/uL   RBC 3.60 (L) 4.40 - 5.90 MIL/uL   Hemoglobin 12.3 (L) 13.0 - 18.0 g/dL   HCT 35.9 (L) 40.0 - 52.0 %   MCV 99.5 80.0 - 100.0 fL   MCH 34.3 (H) 26.0 - 34.0 pg   MCHC 34.4 32.0 - 36.0 g/dL   RDW 15.3 (H) 11.5 - 14.5 %   Platelets 274 150 - 440 K/uL   Neutrophils Relative % 70 %   Neutro Abs 5.0 1.4 - 6.5 K/uL   Lymphocytes Relative 15 %   Lymphs Abs 1.1 1.0 - 3.6 K/uL   Monocytes Relative 11 %   Monocytes Absolute 0.8 0.2 - 1.0 K/uL   Eosinophils Relative 4 %   Eosinophils Absolute 0.3 0 - 0.7 K/uL   Basophils Relative 0 %   Basophils Absolute 0.0 0 - 0.1 K/uL    Comment: Performed at [facility], [address] [place], [place] [address]  Comprehensive metabolic panel     Status: Abnormal   Collection Time: 01/12/18  4:23 PM  Result Value Ref Range   Sodium 138 135 - 145 mmol/L   Potassium 3.4 (L) 3.5 - 5.1 mmol/L   Chloride 102 101 - 111 mmol/L   CO2 24 22 - 32 mmol/L   Glucose, Bld 191 (H) 65 - 99 mg/dL   BUN 11 6 - 20 mg/dL   Creatinine, Ser 0.91 0.61 - 1.24 mg/dL   Calcium 9.2 8.9 - 10.3 mg/dL   Total Protein 7.7 6.5 - 8.1 g/dL   Albumin 3.6 3.5 - 5.0 g/dL   AST 24 15 - 41 U/L   ALT 12 (L) 17 - 63 U/L   Alkaline Phosphatase 90 38 - 126 U/L   Total Bilirubin 0.7 0.3 - 1.2 mg/dL   GFR calc non Af Amer >60 >60 mL/min   GFR calc Af Amer >60 >60 mL/min    Comment: (NOTE) The eGFR has been calculated using the CKD EPI equation. This calculation has not been validated in all clinical situations. eGFR's persistently <60 mL/min signify possible Chronic Kidney Disease.    Anion gap 12 5 - 15    Comment: Performed at [facility], [address] [place], [place] [address]  Protime-INR     Status: Abnormal    Collection Time:  01/23/18 11:36 AM  Result Value Ref Range   Prothrombin Time 26.7 (H) 11.4 - 15.2 seconds   INR 2.49     Comment: Performed at [facility], [address] [place], [place] [address]  Culture, blood (single) w Reflex to ID Panel     Status: None   Collection Time: 01/23/18 11:36 AM  Result Value Ref Range   Specimen Description BLOOD RIGHT ANTECUBITAL    Special Requests      BOTTLES DRAWN AEROBIC AND ANAEROBIC Blood Culture adequate volume   Culture      NO GROWTH 5 DAYS Performed at [facility], [address] [place], [place] [address]    Report Status 01/28/2018 FINAL   Culture, blood (single) w Reflex to ID Panel     Status: None   Collection Time: 01/23/18 11:36 AM  Result Value Ref Range   Specimen Description BLOOD BLOOD RIGHT HAND    Special Requests      BOTTLES DRAWN AEROBIC AND ANAEROBIC Blood Culture adequate volume   Culture      NO GROWTH 5 DAYS Performed at [facility], [address] [place], [place] [address]    Report Status 01/28/2018 FINAL   Urinalysis, Complete     Status: Abnormal   Collection Time: 01/31/18  1:21 PM  Result Value Ref Range   Specific Gravity, UA >1.030 (H) 1.005 - 1.030   pH, UA 5.0 5.0 - 7.5   Color, UA Yellow Yellow   Appearance Ur Cloudy (A) Clear   Leukocytes, UA Negative Negative   Protein, UA Trace (A) Negative/Trace   Glucose, UA Negative Negative   Ketones, UA Trace (A) Negative   RBC, UA Negative Negative   Bilirubin, UA Negative Negative   Urobilinogen, Ur 0.2 0.2 - 1.0 mg/dL   Nitrite, UA Negative Negative   Microscopic Examination See below:   Microscopic Examination     Status: Abnormal   Collection Time: 01/31/18  1:21 PM  Result Value Ref Range   WBC, UA None seen 0 - 5 /hpf   RBC, UA None seen 0 - 2 /hpf   Epithelial Cells (non renal) None seen 0 - 10 /hpf   Casts Present (A) None seen /lpf   Cast Type Hyaline casts N/A   Crystals Present (A) N/A   Crystal  Type Calcium Oxalate N/A   Mucus, UA Present (A) Not Estab.   Bacteria, UA Few (A) None seen/Few   Objective  Body mass index is 28.59 kg/m. Wt Readings from Last 3 Encounters:  01/31/18 188 lb (85.3 kg)  01/27/18 188 lb (85.3 kg)  01/20/18 187 lb (84.8 kg)   Temp Readings from Last 3 Encounters:  02/09/18 99.2 F (37.3 C) (Oral)  01/27/18 98.7 F (37.1 C) (Oral)  01/20/18 (!) 97.4 F (36.3 C) (Oral)   BP Readings from Last 3 Encounters:  02/09/18 (!) 142/66  01/31/18 (!) 110/58  01/28/18 (!) 180/69   Pulse Readings from Last 3 Encounters:  02/09/18 66  01/31/18 69  01/28/18 (!) 55    Physical Exam  Constitutional: Vital signs are normal. He appears well-developed and well-nourished. He appears lethargic.  HENT:  Head: Normocephalic and atraumatic.  Mouth/Throat: Oropharynx is clear and moist and mucous membranes are normal.  Eyes: Pupils are equal, round, and reactive to light. Conjunctivae are normal.  Cardiovascular: Normal rate, regular rhythm and normal heart sounds.  Pulmonary/Chest: Effort normal and breath sounds normal.  Neurological: He appears lethargic.  +falling  asleep on exam  In wheelchair today  Oriented to person, place   Skin: Skin is warm, dry and intact.  Psychiatric: He has a normal mood and affect. His speech is normal and behavior is normal. Judgment and thought content normal.  Falling asleep in exam   Nursing note and vitals reviewed.   Assessment   1. Altered mental status x 1-2 days unclear etiology he does have OSA and not using CPAP r/o brain etiology MRI/CT head neg 12/2017. Reviewed med list though other than antihistamines and gabapentin not on any meds known to cause sedation  2. Fatigue  3. Cough 4. Sinusitis  5. H/o Afib on coumadin  Plan   1.  rec consult neurology, ABG, stat CT head, CXR while at [facility] Will need neurology consult outpt as well  Have pt to go ED He has f/u pulm 02/20/18 for OSA and chronic cough   2. See  #1  3. Consider Abx CT chest had 01/23/18 no infection noted lung nodules and granulomatous disease  rec prn robitussin/mucinex dm otc  4. Consider abx  Add singulair, stop Astepro not helping change to atrovent, on xyzal wife does not want to try flonase due to side effects  5. Check INR at hospital   Provider: Dr. Olivia Mackie McLean-Scocuzza-Internal Medicine

## 2018-02-09 NOTE — ED Notes (Signed)
Patient transported to CT 

## 2018-02-09 NOTE — Progress Notes (Signed)
Wrong pt  ABG resulted in pt  Chart ,  RN IN CHARGE OF THE PT made awAre , corrected , with new ABG  resulted at 2103.

## 2018-02-09 NOTE — ED Notes (Signed)
Respiratory called and informed of need for blood gas.

## 2018-02-09 NOTE — Progress Notes (Signed)
Pre visit review using our clinic review tool, if applicable. No additional management support is needed unless otherwise documented below in the visit note. 

## 2018-02-09 NOTE — ED Notes (Signed)
Wrong pt blood gas resulted on this pt. New blood gas order placed and RT notified of need to clear results from this pt's chart.

## 2018-02-09 NOTE — ED Notes (Signed)
Dinner tray and drink provided to pt.

## 2018-02-09 NOTE — ED Notes (Signed)
First Nurse Note:  Patient was sent to the ED by his PCP for increased tiredness for the past few days.
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
## 2018-02-09 NOTE — Patient Instructions (Addendum)
Please try Robitussin DM or Mucinex DM for cough as needed  Please go to the emergency room to be evaluated and possibly admitted   Confusion Confusion is the inability to think with your usual speed or clarity. Confusion may come on quickly or slowly over time. How quickly the confusion comes on depends on the cause. Confusion can be due to any number of causes. What are the causes?  Concussion, head injury, or head trauma.  Seizures.  Stroke.  Fever.  Brain tumor.  Age related decreased brain function (dementia).  Heightened emotional states like rage or terror.  Mental illness in which the person loses the ability to determine what is real and what is not (hallucinations).  Infections such as a urinary tract infection (UTI).  Toxic effects from alcohol, drugs, or prescription medicines.  Dehydration and an imbalance of salts in the body (electrolytes).  Lack of sleep.  Low blood sugar (diabetes).  Low levels of oxygen from conditions such as chronic lung disorders.  Drug interactions or other medicine side effects.  Nutritional deficiencies, especially niacin, thiamine, vitamin C, or vitamin B.  Sudden drop in body temperature (hypothermia).  Change in routine, such as when traveling or hospitalized. What are the signs or symptoms? People often describe their thinking as cloudy or unclear when they are confused. Confusion can also include feeling disoriented. That means you are unaware of where or who you are. You may also not know what the date or time is. If confused, you may also have difficulty paying attention, remembering, and making decisions. Some people also act aggressively when they are confused. How is this diagnosed? The medical evaluation of confusion may include:  Blood and urine tests.  X-rays.  Brain and nervous system tests.  Analyzing your brain waves (electroencephalogram or EEG).  Magnetic resonance imaging (MRI) of your head.  Computed  tomography (CT) scan of your head.  Mental status tests in which your health care provider may ask many questions. Some of these questions may seem silly or strange, but they are a very important test to help diagnose and treat confusion.  How is this treated? An admission to the hospital may not be needed, but a person with confusion should not be left alone. Stay with a family member or friend until the confusion clears. Avoid alcohol, pain relievers, or sedative drugs until you have fully recovered. Do not drive until directed by your health care provider. Follow these instructions at home: What family and friends can do:  To find out if someone is confused, ask the person to state his or her name, age, and the date. If the person is unsure or answers incorrectly, he or she is confused.  Always introduce yourself, no matter how well the person knows you.  Often remind the person of his or her location.  Place a calendar and clock near the confused person.  Help the person with his or her medicines. You may want to use a pill box, an alarm as a reminder, or give the person each dose as prescribed.  Talk about current events and plans for the day.  Try to keep the environment calm, quiet, and peaceful.  Make sure the person keeps follow-up visits with his or her health care provider.  How is this prevented? Ways to prevent confusion:  Avoid alcohol.  Eat a balanced diet.  Get enough sleep.  Take medicine only as directed by your health care provider.  Do not become isolated. Spend time  with other people and make plans for your days.  Keep careful watch on your blood sugar levels if you are diabetic.  Get help right away if:  You develop severe headaches, repeated vomiting, seizures, blackouts, or slurred speech.  There is increasing confusion, weakness, numbness, restlessness, or personality changes.  You develop a loss of balance, have marked dizziness, feel  uncoordinated, or fall.  You have delusions, hallucinations, or develop severe anxiety.  Your family members think you need to be rechecked. This information is not intended to replace advice given to you by your health care provider. Make sure you discuss any questions you have with your health care provider. Document Released: 10/07/2004 Document Revised: 03/19/2016 Document Reviewed: 10/05/2013 Elsevier Interactive Patient Education  [employer].

## 2018-02-09 NOTE — ED Triage Notes (Signed)
Patient presents to the ED from PCP for altered mental status for the past 2-3 days.  Patient's wife reports patient being very tired and lying in bed more than normal.  Patient appears tired during triage.  Patient states, "I feel like I'm leaving this world."  Patient denies any pain at this time.  Patient isn't in any obvious distress at this time.

## 2018-02-10 ENCOUNTER — Telehealth: Payer: Self-pay

## 2018-02-10 DIAGNOSIS — I2510 Atherosclerotic heart disease of native coronary artery without angina pectoris: Secondary | ICD-10-CM | POA: Diagnosis not present

## 2018-02-10 DIAGNOSIS — I10 Essential (primary) hypertension: Secondary | ICD-10-CM | POA: Diagnosis not present

## 2018-02-10 DIAGNOSIS — J019 Acute sinusitis, unspecified: Secondary | ICD-10-CM | POA: Diagnosis not present

## 2018-02-10 DIAGNOSIS — R400 Somnolence: Secondary | ICD-10-CM | POA: Diagnosis not present

## 2018-02-10 DIAGNOSIS — G4733 Obstructive sleep apnea (adult) (pediatric): Secondary | ICD-10-CM | POA: Diagnosis not present

## 2018-02-10 LAB — URINALYSIS, COMPLETE (UACMP) WITH MICROSCOPIC
Bilirubin Urine: NEGATIVE
Glucose, UA: NEGATIVE mg/dL
Hgb urine dipstick: NEGATIVE
Ketones, ur: NEGATIVE mg/dL
Leukocytes, UA: NEGATIVE
Nitrite: NEGATIVE
Protein, ur: NEGATIVE mg/dL
Specific Gravity, Urine: 1.013 (ref 1.005–1.030)
Squamous Epithelial / LPF: NONE SEEN (ref 0–5)
pH: 5 (ref 5.0–8.0)

## 2018-02-10 LAB — BLOOD GAS, ARTERIAL
ACID-BASE EXCESS: 1.8 mmol/L (ref 0.0–2.0)
Bicarbonate: 25.7 mmol/L (ref 20.0–28.0)
FIO2: 28
O2 SAT: 95.4 %
PATIENT TEMPERATURE: 37
PO2 ART: 74 mmHg — AB (ref 83.0–108.0)
pCO2 arterial: 37 mmHg (ref 32.0–48.0)
pH, Arterial: 7.45 (ref 7.350–7.450)

## 2018-02-10 LAB — GLUCOSE, CAPILLARY
Glucose-Capillary: 197 mg/dL — ABNORMAL HIGH (ref 65–99)
Glucose-Capillary: 251 mg/dL — ABNORMAL HIGH (ref 65–99)
Glucose-Capillary: 177 mg/dL — ABNORMAL HIGH (ref 65–99)
Glucose-Capillary: 163 mg/dL — ABNORMAL HIGH (ref 65–99)

## 2018-02-10 LAB — TSH: TSH: 5.099 u[IU]/mL — ABNORMAL HIGH (ref 0.350–4.500)

## 2018-02-10 LAB — PROTIME-INR
INR: 1.7
PROTHROMBIN TIME: 19.8 s — AB (ref 11.4–15.2)

## 2018-02-10 MED ORDER — MUPIROCIN CALCIUM 2 % EX CREA
TOPICAL_CREAM | Freq: Two times a day (BID) | CUTANEOUS | Status: DC
  Filled 2018-02-10: qty 15

## 2018-02-10 MED ORDER — GLUCERNA SHAKE PO LIQD
237.0000 mL | Freq: Two times a day (BID) | ORAL | Status: DC
  Administered 2018-02-10 – 2018-02-11 (×3): 237 mL via ORAL

## 2018-02-10 MED ORDER — GUAIFENESIN ER 600 MG PO TB12
600.0000 mg | ORAL_TABLET | Freq: Two times a day (BID) | ORAL | Status: DC
  Administered 2018-02-10 – 2018-02-11 (×3): 600 mg via ORAL
  Filled 2018-02-10 (×3): qty 1

## 2018-02-10 MED ORDER — AMOXICILLIN-POT CLAVULANATE 875-125 MG PO TABS
1.0000 | ORAL_TABLET | Freq: Two times a day (BID) | ORAL | Status: DC
  Administered 2018-02-10 – 2018-02-11 (×3): 1 via ORAL
  Filled 2018-02-10 (×3): qty 1

## 2018-02-10 MED ORDER — ERYTHROMYCIN 5 MG/GM OP OINT
TOPICAL_OINTMENT | Freq: Three times a day (TID) | OPHTHALMIC | Status: DC
  Administered 2018-02-10 – 2018-02-11 (×4): 1 via OPHTHALMIC
  Filled 2018-02-10: qty 1

## 2018-02-10 MED ORDER — GUAIFENESIN 100 MG/5ML PO SOLN
5.0000 mL | ORAL | Status: DC | PRN
  Administered 2018-02-10 – 2018-02-11 (×4): 100 mg via ORAL
  Filled 2018-02-10 (×9): qty 5

## 2018-02-10 NOTE — Telephone Encounter (Signed)
They would not direct admitted him without evaluation in the ED due to symptoms   [place]

## 2018-02-10 NOTE — Progress Notes (Signed)
Advanced care plan.  Purpose of the Encounter: CODE STATUS Parties in Attendance:Patient Patient's Decision Capacity:Good Subjective/Patient's story: Patient presented to the emergency room for increased somnolence Objective/Medical story Tried CPAP last night and somnolence decreased Was alert this am Has crusting in the right ey with discharge receiving antibiotic cream Goals of care determination:  Advance directives discussed with patient and family in detail Goals of care discussed For now they want everything done which includes cardiac resuscitation, intubation and ventilator if need arises CODE STATUS: Full code Time spent discussing advanced care planning: 16 minutes

## 2018-02-10 NOTE — Care Management Obs Status (Signed)
[place] NOTIFICATION   Patient Details  Name: Chase Cabrera MRN: [id number] Date of Birth: [birth year]   Medicare Observation Status Notification Given:  Yes: Wife Eula informed of observation status    Shelbie Ammons, RN 02/10/2018, 10:37 AM

## 2018-02-10 NOTE — Progress Notes (Signed)
Patient seen and evaluated today this morning Advised patient to use CPAP at bedtime Continue erythromycin antibiotic ointment to the right eye Started on oral Augmentin for sinus infection Currently INR is subtherapeutic Patient on anticoagulation with subcu Lovenox and oral warfarin Once INR is therapeutic we will discontinue Lovenox I agree with the physical exam treatment plan by Dr. Marcille Blanco Discussed medical condition treatment plan with patient's family in detail

## 2018-02-10 NOTE — Telephone Encounter (Signed)
Copied from [place] [phone] [id number]. Topic: General - Other >> Feb 09, 2018  5:34 PM Valla Leaver wrote: Reason for CRM: Patient's wife [name] is calling because Dr. Aundra Dubin advised pt to go to ED but did not write "direct admit" on his paperwork. The patient has to be evaluated at ED as a result of this. [name] would like a call back to discuss.

## 2018-02-10 NOTE — Clinical Social Work Note (Signed)
CSW received phone call from [place] [phone] with [facility]. Inez Catalina states that patient is currently open with them and is receiving Nursing and PT home care. CSW notified RNCM of above.   Annamaria Boots MSW, [place] [phone]

## 2018-02-10 NOTE — Progress Notes (Signed)
Chase Cabrera at [place] NAME: Chase Cabrera    MR#:  [id number]  DATE OF BIRTH:  06-05-1946  SUBJECTIVE:  Patient seen and evaluated today More awake and alert Has crusting and discharge from right eye Does not like using cpap at night Has some cough and congestion     REVIEW OF SYSTEMS:    ROS  CONSTITUTIONAL: No documented fever. No fatigue, weakness. No weight gain, no weight loss.  EYES: No blurry or double vision.  Has crusting and discharge right eye ENT: No tinnitus. No postnasal drip. No redness of the oropharynx.  RESPIRATORY: Has cough, no wheeze, no hemoptysis. No dyspnea.  CARDIOVASCULAR: No chest pain. No orthopnea. No palpitations. No syncope.  GASTROINTESTINAL: No nausea, no vomiting or diarrhea. No abdominal pain. No melena or hematochezia.  GENITOURINARY: No dysuria or hematuria.  ENDOCRINE: No polyuria or nocturia. No heat or cold intolerance.  HEMATOLOGY: No anemia. No bruising. No bleeding.  INTEGUMENTARY: No rashes. No lesions.  MUSCULOSKELETAL: No arthritis. No swelling. No gout.  NEUROLOGIC: No numbness, tingling, or ataxia. No seizure-type activity.  PSYCHIATRIC: No anxiety. No insomnia. No ADD.   DRUG ALLERGIES:   Allergies  Allergen Reactions  . Allopurinol Diarrhea, Other (See Comments) and Nausea And Vomiting    Other Reaction: GI Upset  . Atenolol Other (See Comments)    Other Reaction: bradycardia  . Atorvastatin Other (See Comments)    Other Reaction: muscle aches  . Ramipril Rash  . Rosuvastatin Other (See Comments)    Muscle aches  . Simvastatin Other (See Comments)    Other Reaction: MUSCLE ACHES (Zocor)  . Valsartan Other (See Comments)    Other Reaction: facial swelling  . Cardizem [Diltiazem] Rash    VITALS:  Blood pressure (!) 156/67, pulse 62, temperature 98.4 F (36.9 C), temperature source Oral, resp. rate (!) 22, height 5' 8"  (1.727 m), weight 85.1 kg (187 lb 11.2 oz), SpO2 93  %.  PHYSICAL EXAMINATION:   Physical Exam  GENERAL:  72 y.o.-year-old patient lying in the bed with no acute distress.  EYES: Pupils equal, round, reactive to light and accommodation. No scleral icterus. Extraocular muscles intact.  HEENT: Head atraumatic, normocephalic. Oropharynx and nasopharynx clear.  Right eye red Crusting with discharge right eye lids NECK:  Supple, no jugular venous distention. No thyroid enlargement, no tenderness.  LUNGS: Normal breath sounds bilaterally, no wheezing, rales, rhonchi. No use of accessory muscles of respiration.  CARDIOVASCULAR: S1, S2 normal. No murmurs, rubs, or gallops.  ABDOMEN: Soft, nontender, nondistended. Bowel sounds present. No organomegaly or mass.  EXTREMITIES: No cyanosis, clubbing or edema b/l.    NEUROLOGIC: Cranial nerves II through XII are intact. No focal Motor or sensory deficits b/l.   PSYCHIATRIC: The patient is alert and oriented x 3.  SKIN: No obvious rash, lesion, or ulcer.   LABORATORY PANEL:   CBC Recent Labs  Lab 02/09/18 1654  WBC 9.9  HGB 12.9*  HCT 37.4*  PLT 283   ------------------------------------------------------------------------------------------------------------------ Chemistries  Recent Labs  Lab 02/09/18 1654  NA 134*  K 4.0  CL 99*  CO2 23  GLUCOSE 141*  BUN 17  CREATININE 0.86  CALCIUM 9.5  AST 25  ALT 17  ALKPHOS 88  BILITOT 0.7   ------------------------------------------------------------------------------------------------------------------  Cardiac Enzymes No results for input(s): TROPONINI in the last 168 hours. ------------------------------------------------------------------------------------------------------------------  RADIOLOGY:  Dg Chest 2 View  Result Date: 02/09/2018 CLINICAL DATA:  Cough, congestion, altered mental status over  the last 2-3 days, fatigue EXAM: CHEST - 2 VIEW COMPARISON:  CT chest of 01/23/2018 and chest x-ray of 01/12/2017 FINDINGS: No active  infiltrate or effusion is seen. Somewhat prominent interstitial markings remain at the lung bases left-greater-than-right and changes of prior granulomatous disease are again noted. Very minimal pulmonary vascular congestion cannot be excluded. Cardiomegaly is stable. Median sternotomy sutures are noted from prior CABG. No bony abnormality is seen. IMPRESSION: Stable cardiomegaly. Cannot exclude very mild pulmonary vascular congestion. No pneumonia or effusion is seen. Electronically Signed   By: Ivar Drape M.D.   On: 02/09/2018 17:19   Ct Head Wo Contrast  Result Date: 02/09/2018 CLINICAL DATA:  Altered mental status over the last 2-3 days. Lethargy. EXAM: CT HEAD WITHOUT CONTRAST TECHNIQUE: Contiguous axial images were obtained from the base of the skull through the vertex without intravenous contrast. COMPARISON:  MRI 12/15/2017. FINDINGS: Brain: Moderate generalized atrophy. Mild to moderate chronic small-vessel ischemic changes of the hemispheric white matter. No sign of acute or subacute infarction, mass lesion, hemorrhage, hydrocephalus or extra-axial collection. Vascular: There is atherosclerotic calcification of the major vessels at the base of the brain. Skull: Normal Sinuses/Orbits: Mild mucosal thickening of the right maxillary sinus. Other sinuses clear. Orbits negative. Other: None IMPRESSION: No acute intracranial finding. Moderate atrophy. Mild to moderate chronic small-vessel changes of the hemispheric white matter. Electronically Signed   By: Nelson Chimes M.D.   On: 02/09/2018 19:38     ASSESSMENT AND PLAN:  72 yr old male patient with history of CAD, CHF, diabetes mellitus, GERD under hospitalist service for somnolence  - Excessive somnolence secondary to sleep apnea Encouraged to use cpap at bed time CT head no acute abnormality  -Ambulatory dysfunction Physical therapy evaluation  - Right eye conjunctivitis Continue erythromycin ointment  -CAD  Continue aspirin, plavix and  imdur  -Diabetes mellitus type II Diabetic diet Sliding scale covergage with insulin  All the records are reviewed and case discussed with Care Management/Social Worker. Management plans discussed with the patient, family and they are in agreement.  CODE STATUS: Full code  DVT Prophylaxis: SCDs  TOTAL TIME TAKING CARE OF THIS PATIENT :35  minutes.   POSSIBLE D/C IN 1 to 2 DAYS, DEPENDING ON CLINICAL CONDITION.  Saundra Shelling M.D on 02/10/2018 at 7:00 PM  Between 7am to 6pm - Pager - [phone]  After 6pm go to [email or web address] - password [email or web address] [facility]  Office  [phone]  CC: Primary care physician; McLean-Scocuzza, Nino Glow, MD  Note: This dictation was prepared with Dragon dictation along with smaller phrase technology. Any transcriptional errors that result from this process are unintentional.

## 2018-02-10 NOTE — Progress Notes (Signed)
PT Cancellation Note  Patient Details Name: Chase Cabrera MRN: [id number] DOB: [birth year]   Cancelled Treatment:    Reason Eval/Treat Not Completed: Fatigue/lethargy limiting ability to participate. Chart reviewed, RN consulted. RN reposts patient has been mostly alert today. Upon entry, both patient and wife sleeping heavily, do not wake to auditory stimuli, but require gentle tactile stimulation. Pt is unable to maintain eyes open, somewhat conversational with delayed response time, but then falls asleep, breathing heavily and audibly 5x during questioning in less than 3 minutes. Pt not alert enough for safe mobility OOB at this time, nor for accurate appraisal of safety and /or balance. Will attempt again at later date time when patient in more alert.   1:45 PM, 02/10/18 Etta Grandchild, PT, DPT Physical Therapist - [facility]  [phone] ([place])     [place] C 02/10/2018, 1:43 PM

## 2018-02-10 NOTE — Progress Notes (Signed)
Initial Nutrition Assessment  DOCUMENTATION CODES:   Not applicable  INTERVENTION:   -Continue MVI daily -Glucerna Shake po BID, each supplement provides 220 kcal and 10 grams of protein  NUTRITION DIAGNOSIS:   Inadequate oral intake related to lethargy/confusion, decreased appetite as evidenced by per patient/family report.  GOAL:   Patient will meet greater than or equal to 90% of their needs  MONITOR:   PO intake, Supplement acceptance, Labs, Weight trends, Skin, I & O's  REASON FOR ASSESSMENT:   Malnutrition Screening Tool    ASSESSMENT:   The patient with past medical history of CAD status post PCI, atrial fibrillation, obstructive sleep apnea, Crohn's disease, diabetes and reported lung disease presents emergency department complaint due to excessive somnolence and weakness.    Pt admitted with somnolence and AMS.   Pt somnolent at time of visit. Hx obtained from pt wife at bedside. She reports pt underwent rt hip arthoplasty s/p fall on 12/13/17, for which he was discharged to SNF for rehab. Pt returned home about one month ago. Per pt wife, intake has decreased over the past 1-2 months (was also not eating well at [facility]). She reports pt generally consumes 3 meals per day (Breakfast: oatmeal or cereal, Lunch: soup or sandwich, Dinner: meat and vegetables (ex grilled chicken, baked potato and salad OR pinto beans and cornbread).   Pt consumed "a good breakfast" today per pt wife (boiled egg, cereal, and coffee). Assisted wife with lunch and dinner orders.   Per pt wife, pt has experienced a 10# wt loss over the past month, due to poor oral intake. However, this is not consistent with wt hx. Noted pt has experienced a 3.6% wt loss over the past 3 months, which is not consistent for time frame.   Last Hgb A1c: 5.8 (01/12/18). PTA DM medications 1000 mg metformin BID and 14-32 units insulin lispro sliding scale. Pt wife reports she has been modifying recipes and decreasing potato  consumption related to DM. She endorses good blood sugar control at home (usually less than 120). Pt checks CBGS 4 times daily.   Reviewed DM diet principles with pt wife; she is familiar with diet guidelines and did not desire further education. Discussed importance of good meal and supplement intake to promote healing. She is amenable to Glucerna supplements to promote nutritional adequacy.   Labs reviewed: CBGS: 155-240 (inpatient orders for glycemic control are 0-20 units insulin aspart TID and 60 units insulin detemir q HS).   NUTRITION - FOCUSED PHYSICAL EXAM:    Most Recent Value  Orbital Region  No depletion  Upper Arm Region  Mild depletion  Thoracic and Lumbar Region  No depletion  Buccal Region  No depletion  Temple Region  No depletion  Clavicle Bone Region  No depletion  Clavicle and Acromion Bone Region  No depletion  Scapular Bone Region  No depletion  Dorsal Hand  No depletion  Patellar Region  Mild depletion  Anterior Thigh Region  Mild depletion  Posterior Calf Region  Mild depletion  Edema (RD Assessment)  None  Hair  Reviewed  Eyes  Reviewed  Mouth  Reviewed  Skin  Reviewed  Nails  Reviewed       Diet Order:   Diet Order           Diet heart healthy/carb modified Room service appropriate? Yes; Fluid consistency: Thin  Diet effective now          EDUCATION NEEDS:   Education needs have been addressed  Skin:  Skin Assessment: Reviewed RN Assessment  Last BM:  02/09/18  Height:   Ht Readings from Last 1 Encounters:  02/09/18 5' 8"  (1.727 m)    Weight:   Wt Readings from Last 1 Encounters:  02/10/18 187 lb 11.2 oz (85.1 kg)    Ideal Body Weight:  70 kg  BMI:  Body mass index is 28.54 kg/m.  Estimated Nutritional Needs:   Kcal:  1900-2100  Protein:  105-120 grams  Fluid:  1.9-2.1 L    Nira Visscher A. Jimmye Norman, RD, LDN, CDE Pager: [phone] After hours Pager: [phone]

## 2018-02-10 NOTE — H&P (Signed)
Chase Cabrera is an 72 y.o. male.   Chief Complaint: Somnolence HPI: The patient with past medical history of CAD status post PCI, atrial fibrillation, obstructive sleep apnea, Crohn's disease, diabetes and reported lung disease presents emergency department complaint due to excessive somnolence and weakness.  The patient's wife states that he has been difficult to arouse and will only stay alert while eating.  She denies any new medications.  Blood gases obtained in the emergency department were unremarkable.  The patient needed supplemental oxygen only intermittently but would remain incredibly somnolent which prompted the emergency department staff to call the hospitalist service for admission.  Past Medical History:  Diagnosis Date  . Arthritis   . Atrial fibrillation ([place])   . CAD (coronary artery disease)    6 STENTS  . CHF (congestive heart failure) ([place])   . Chicken pox   . Colon polyp   . Corneal dystrophy    bilateral  . Crohn's disease ([place])   . Diabetes ([place]) 2002  . Dysrhythmia    A-FIB AND PAF  . GERD (gastroesophageal reflux disease)   . Gout   . History of kidney stones   . History of shingles   . Hyperlipemia   . Hypertension   . Kidney stones   . Myocardial infarction ([place])    x4, last one in 2010  . Peripheral neuropathy   . Peripheral neuropathy   . Peripheral neuropathy   . PUD (peptic ulcer disease)   . Renal artery stenosis ([place])   . Renal artery stenosis ([place])   . Salzmann's nodular dystrophy 2012  . Sleep apnea   . Spinal stenosis     Past Surgical History:  Procedure Laterality Date  . ABLATION  2012  . APPLICATION VERTERBRAL DEFECT PROSTHETIC  05/01/2013  . ARTHRODESIS ANTERIOR LUMBAR SPINE  05/01/2013  . BACK SURGERY     lumbar fusion  . CARDIAC CATHETERIZATION    . CARDIAC ELECTROPHYSIOLOGY STUDY AND ABLATION    . CARDIAC SURGERY    . CARDIOVERSION    . CHOLECYSTECTOMY    . COLONOSCOPY WITH PROPOFOL N/A 04/09/2016   Completed for  chronic diarrhea.  Few scattered diverticuli.  No mucosal lesions.  Surgeon: Lollie Sails, MD;  Location: [facility] ENDOSCOPY;  Service: Endoscopy;  Laterality: N/A;  . CORNEAL EYE SURGERY Bilateral 07/28/11    09/22/2011  . CORONARY ANGIOPLASTY    . CORONARY ANGIOPLASTY WITH STENT PLACEMENT  03/28/2015   Distal 80% to normal Stent, Dilation Balloon  . CORONARY ARTERY BYPASS GRAFT     4 VESSELS  . EYE SURGERY     bilateral cataract  . foot and ankle repair Right 2005  . FRACTURE SURGERY     ANKLE PLATE AND SCREWS  . GALLBLADER    . JOINT REPLACEMENT     left total hip 11/11/15  . JOINT REPLACEMENT     right total hip 12/2017 Dr. Rudene Christians   . KNEE ARTHROSCOPY Right   . LUMBAR SPINE FUSION ONE LEVEL  05/03/2013  . RENAL ARTERY STENT Right   . ROTATOR CUFF REPAIR Right 2006  . TONSILLECTOMY    . TOTAL HIP ARTHROPLASTY Left 11/11/2015   Procedure: TOTAL HIP ARTHROPLASTY ANTERIOR APPROACH;  Surgeon: Hessie Knows, MD;  Location: [facility] ORS;  Service: Orthopedics;  Laterality: Left;  . TOTAL HIP ARTHROPLASTY Right 12/13/2017   Procedure: TOTAL HIP ARTHROPLASTY ANTERIOR APPROACH;  Surgeon: Hessie Knows, MD;  Location: [facility] ORS;  Service: Orthopedics;  Laterality: Right;  . TRANSCATH  PLACEMENT INTRAVASCULAR STENT LEG  03/2015    Family History  Problem Relation Age of Onset  . CVA Father   . Stroke Father   . Lung cancer Mother   . Arthritis Mother   . Stomach cancer Sister   . Colon cancer Sister   . Diabetes Brother    Social History:  reports that he quit smoking about 55 years ago. His smoking use included cigarettes. He has a 3.00 pack-year smoking history. He quit smokeless tobacco use about 49 years ago. His smokeless tobacco use included chew. He reports that he does not drink alcohol or use drugs.  Allergies:  Allergies  Allergen Reactions  . Allopurinol Diarrhea, Other (See Comments) and Nausea And Vomiting    Other Reaction: GI Upset  . Atenolol Other (See Comments)    Other  Reaction: bradycardia  . Atorvastatin Other (See Comments)    Other Reaction: muscle aches  . Ramipril Rash  . Rosuvastatin Other (See Comments)    Muscle aches  . Simvastatin Other (See Comments)    Other Reaction: MUSCLE ACHES (Zocor)  . Valsartan Other (See Comments)    Other Reaction: facial swelling  . Cardizem [Diltiazem] Rash    Medications Prior to Admission  Medication Sig Dispense Refill  . amLODipine (NORVASC) 10 MG tablet TAKE 1 TABLET BY MOUTH ONCE DAILY (Patient taking differently: Take 10 mg by mouth at bedtime) 30 tablet 0  . carvedilol (COREG) 25 MG tablet Take 1 tablet (25 mg total) by mouth 2 (two) times daily with a meal. 180 tablet 1  . Cholecalciferol 50000 units TABS Take 1 tablet by mouth once a week. (Patient taking differently: Take 50,000 Units by mouth every Saturday. ) 13 tablet 1  . cholestyramine light (PREVALITE) 4 g packet Take 4 g by mouth every morning.     . clopidogrel (PLAVIX) 75 MG tablet Take 75 mg by mouth daily.    . colchicine 0.6 MG tablet TAKE 1 TABLET BY MOUTH ONCE DAILY (Patient taking differently: TAKE 0.6 MG BY MOUTH ONCE DAILY) 60 tablet 0  . diclofenac sodium (VOLTAREN) 1 % GEL Apply 2 g topically 4 (four) times daily as needed (for pain).     Marland Kitchen dicyclomine (BENTYL) 10 MG capsule Take 10 mg by mouth 3 (three) times daily before meals.     . gabapentin (NEURONTIN) 300 MG capsule take 2 capsules (634m) by mouth three times a day 180 capsule 1  . hydrALAZINE (APRESOLINE) 100 MG tablet TAKE 100 MG BY MOUTH THREE TIMES A DAY 270 tablet 1  . insulin lispro (HUMALOG) 100 UNIT/ML KiwkPen Inject 14-32u under the skin three times daily before meals according to sliding scale    . isosorbide mononitrate (IMDUR) 120 MG 24 hr tablet TAKE 1 TABLET BY MOUTH EVERY DAY 90 tablet 1  . LEVEMIR FLEXTOUCH 100 UNIT/ML Pen Inject 80 Units into the skin daily at 10 pm. 15 mL 1  . metFORMIN (GLUCOPHAGE) 1000 MG tablet Take 1,000 mg by mouth 2 (two) times daily.    0  . Multiple Vitamin (MULTI-VITAMINS) TABS Take 1 tablet by mouth daily.    . nitroGLYCERIN (NITROSTAT) 0.4 MG SL tablet Place 1 tablet (0.4 mg total) under the tongue every 5 (five) minutes x 3 doses as needed. For chest pain.  Call 911 if no relief after 3 tablets 30 tablet 11  . omeprazole (PRILOSEC) 40 MG capsule Take 40 mg by mouth 2 (two) times daily.     . pravastatin (  PRAVACHOL) 40 MG tablet TAKE 1 TABLET BY MOUTH ONCE DAILY 90 tablet 0  . probenecid (BENEMID) 500 MG tablet Take 500 mg by mouth 2 (two) times daily.   0  . silodosin (RAPAFLO) 8 MG CAPS capsule Take 1 capsule (8 mg total) by mouth daily with breakfast. 30 capsule 0  . vitamin C (ASCORBIC ACID) 500 MG tablet Take 500 mg by mouth daily.     . Vitamin E 400 units TABS Take 400 Units by mouth 2 (two) times daily.    Marland Kitchen warfarin (COUMADIN) 4 MG tablet Take 1 tablet (4 mg total) by mouth daily at 6 PM. Take 4 mg by mouth daily 90 tablet 3  . HYDROcodone-acetaminophen (NORCO) 7.5-325 MG tablet Take 1-2 tablets by mouth every 4 (four) hours as needed for severe pain (pain score 7-10). (Patient not taking: Reported on 02/09/2018) 30 tablet 0  . hydrocortisone 2.5 % cream Apply topically 2 (two) times daily. (Patient not taking: Reported on 02/09/2018) 30 g 0  . ipratropium (ATROVENT) 0.06 % nasal spray Place 2 sprays into both nostrils 4 (four) times daily. 15 mL 12  . levocetirizine (XYZAL) 5 MG tablet Take 1 tablet (5 mg total) by mouth every evening.    . montelukast (SINGULAIR) 10 MG tablet Take 1 tablet (10 mg total) by mouth at bedtime. 90 tablet 0    Results for orders placed or performed during the hospital encounter of 02/09/18 (from the past 48 hour(s))  Comprehensive metabolic panel     Status: Abnormal   Collection Time: 02/09/18  4:54 PM  Result Value Ref Range   Sodium 134 (L) 135 - 145 mmol/L   Potassium 4.0 3.5 - 5.1 mmol/L   Chloride 99 (L) 101 - 111 mmol/L   CO2 23 22 - 32 mmol/L   Glucose, Bld 141 (H) 65 - 99  mg/dL   BUN 17 6 - 20 mg/dL   Creatinine, Ser 0.86 0.61 - 1.24 mg/dL   Calcium 9.5 8.9 - 10.3 mg/dL   Total Protein 8.4 (H) 6.5 - 8.1 g/dL   Albumin 3.8 3.5 - 5.0 g/dL   AST 25 15 - 41 U/L   ALT 17 17 - 63 U/L   Alkaline Phosphatase 88 38 - 126 U/L   Total Bilirubin 0.7 0.3 - 1.2 mg/dL   GFR calc non Af Amer >60 >60 mL/min   GFR calc Af Amer >60 >60 mL/min    Comment: (NOTE) The eGFR has been calculated using the CKD EPI equation. This calculation has not been validated in all clinical situations. eGFR's persistently <60 mL/min signify possible Chronic Kidney Disease.    Anion gap 12 5 - 15    Comment: Performed at [facility], [address] [place], [place] [address]  Lactic acid, plasma     Status: None   Collection Time: 02/09/18  4:54 PM  Result Value Ref Range   Lactic Acid, Venous 1.7 0.5 - 1.9 mmol/L    Comment: Performed at [facility], [address] [place], [place] [address]  CBC with Differential     Status: Abnormal   Collection Time: 02/09/18  4:54 PM  Result Value Ref Range   WBC 9.9 3.8 - 10.6 K/uL   RBC 3.90 (L) 4.40 - 5.90 MIL/uL   Hemoglobin 12.9 (L) 13.0 - 18.0 g/dL   HCT 37.4 (L) 40.0 - 52.0 %   MCV 95.9 80.0 - 100.0 fL   MCH 32.9 26.0 - 34.0 pg  MCHC 34.4 32.0 - 36.0 g/dL   RDW 15.0 (H) 11.5 - 14.5 %   Platelets 283 150 - 440 K/uL   Neutrophils Relative % 64 %   Neutro Abs 6.4 1.4 - 6.5 K/uL   Lymphocytes Relative 15 %   Lymphs Abs 1.5 1.0 - 3.6 K/uL   Monocytes Relative 12 %   Monocytes Absolute 1.2 (H) 0.2 - 1.0 K/uL   Eosinophils Relative 8 %   Eosinophils Absolute 0.8 (H) 0 - 0.7 K/uL   Basophils Relative 1 %   Basophils Absolute 0.1 0 - 0.1 K/uL    Comment: Performed at [facility], [address] [place], [place] [address]  Protime-INR     Status: Abnormal   Collection Time: 02/09/18  4:54 PM  Result Value Ref Range   Prothrombin Time 22.3 (H) 11.4 - 15.2 seconds   INR 1.98     Comment: Performed at  [facility], [address] [place], [place] [address]  Blood gas, arterial ([facility] & [facility] ONLY)     Status: Abnormal (Preliminary result)   Collection Time: 02/09/18  7:19 PM  Result Value Ref Range   FIO2 21.00    pH, Arterial 7.33 (L) 7.350 - 7.450   pCO2 arterial 34 32.0 - 48.0 mmHg   pO2, Arterial 57 (L) 83.0 - 108.0 mmHg   Bicarbonate 17.9 (L) 20.0 - 28.0 mmol/L   Acid-base deficit 7.1 (H) 0.0 - 2.0 mmol/L   O2 Saturation 86.9 %   Patient temperature 37.0    Collection site PENDING    Sample type ARTERIAL DRAW    Allens test (pass/fail) PASS PASS    Comment: Performed at [facility], [address] [place], [place] [address]  Glucose, capillary     Status: Abnormal   Collection Time: 02/09/18  7:59 PM  Result Value Ref Range   Glucose-Capillary 155 (H) 65 - 99 mg/dL  Blood gas, arterial     Status: Abnormal (Preliminary result)   Collection Time: 02/09/18  9:17 PM  Result Value Ref Range   FIO2 28.00    pH, Arterial 7.45 7.350 - 7.450   pCO2 arterial 37 32.0 - 48.0 mmHg   pO2, Arterial 74 (L) 83.0 - 108.0 mmHg   Bicarbonate 25.7 20.0 - 28.0 mmol/L   Acid-Base Excess 1.8 0.0 - 2.0 mmol/L   O2 Saturation 95.4 %   Patient temperature 37.0    Collection site PENDING    Sample type ARTERIAL DRAW    Allens test (pass/fail) PASS PASS    Comment: Performed at [facility], [address] [place], [place] [address]  Glucose, capillary     Status: Abnormal   Collection Time: 02/09/18 11:41 PM  Result Value Ref Range   Glucose-Capillary 240 (H) 65 - 99 mg/dL   Dg Chest 2 View  Result Date: 02/09/2018 CLINICAL DATA:  Cough, congestion, altered mental status over the last 2-3 days, fatigue EXAM: CHEST - 2 VIEW COMPARISON:  CT chest of 01/23/2018 and chest x-ray of 01/12/2017 FINDINGS: No active infiltrate or effusion is seen. Somewhat prominent interstitial markings remain at the lung bases left-greater-than-right and changes of prior granulomatous  disease are again noted. Very minimal pulmonary vascular congestion cannot be excluded. Cardiomegaly is stable. Median sternotomy sutures are noted from prior CABG. No bony abnormality is seen. IMPRESSION: Stable cardiomegaly. Cannot exclude very mild pulmonary vascular congestion. No pneumonia or effusion is seen. Electronically Signed   By: Windy Canny.D.  On: 02/09/2018 17:19   Ct Head Wo Contrast  Result Date: 02/09/2018 CLINICAL DATA:  Altered mental status over the last 2-3 days. Lethargy. EXAM: CT HEAD WITHOUT CONTRAST TECHNIQUE: Contiguous axial images were obtained from the base of the skull through the vertex without intravenous contrast. COMPARISON:  MRI 12/15/2017. FINDINGS: Brain: Moderate generalized atrophy. Mild to moderate chronic small-vessel ischemic changes of the hemispheric white matter. No sign of acute or subacute infarction, mass lesion, hemorrhage, hydrocephalus or extra-axial collection. Vascular: There is atherosclerotic calcification of the major vessels at the base of the brain. Skull: Normal Sinuses/Orbits: Mild mucosal thickening of the right maxillary sinus. Other sinuses clear. Orbits negative. Other: None IMPRESSION: No acute intracranial finding. Moderate atrophy. Mild to moderate chronic small-vessel changes of the hemispheric white matter. Electronically Signed   By: Nelson Chimes M.D.   On: 02/09/2018 19:38    Review of Systems  Constitutional: Positive for malaise/fatigue. Negative for chills and fever.  HENT: Negative for sore throat and tinnitus.   Eyes: Positive for discharge. Negative for blurred vision and redness.  Respiratory: Negative for cough and shortness of breath.   Cardiovascular: Negative for chest pain, palpitations, orthopnea and PND.  Gastrointestinal: Negative for abdominal pain, diarrhea, nausea and vomiting.  Genitourinary: Negative for dysuria, frequency and urgency.  Musculoskeletal: Negative for joint pain and myalgias.  Skin: Negative  for rash.       No lesions  Neurological: Negative for speech change, focal weakness and weakness.  Endo/Heme/Allergies: Does not bruise/bleed easily.       No temperature intolerance  Psychiatric/Behavioral: Negative for depression and suicidal ideas.    Blood pressure (!) 138/53, pulse 67, temperature 98.6 F (37 C), temperature source Oral, resp. rate (!) 22, height _0  (1.727 m), weight 81.1 kg (178 lb 11.2 oz), SpO2 97 %. Physical Exam  Vitals reviewed. Constitutional: He is oriented to person, place, and time. He appears well-developed and well-nourished. No distress.  HENT:  Head: Normocephalic and atraumatic.  Mouth/Throat: Oropharynx is clear and moist.  Eyes: Pupils are equal, round, and reactive to light. Conjunctivae and EOM are normal. Right eye exhibits discharge. No scleral icterus.  Neck: Normal range of motion. Neck supple. No JVD present. No tracheal deviation present. No thyromegaly present.  Cardiovascular: Normal rate, regular rhythm and normal heart sounds. Exam reveals no gallop and no friction rub.  No murmur heard. Respiratory: Effort normal and breath sounds normal. No respiratory distress.  GI: Soft. Bowel sounds are normal. He exhibits no distension. There is no tenderness.  Genitourinary:  Genitourinary Comments: Deferred  Musculoskeletal: Normal range of motion. He exhibits no edema.  Lymphadenopathy:    He has no cervical adenopathy.  Neurological: He is alert and oriented to person, place, and time. No cranial nerve deficit.  Skin: Skin is warm and dry. No rash noted. No erythema.  Psychiatric: He has a normal mood and affect. His behavior is normal. Judgment and thought content normal.     Assessment/Plan This is a 72 year old male admitted for somnolence. 1.  Altered mental status: Somnolence; unclear etiology.  Differential diagnosis includes daytime somnolence secondary to uncontrolled sleep apnea.  The patient has had BiPAP at home in the past  could not tolerate it.  He has agreed to a trial of CPAP here in the hospital.   2.  Hypertension: Acceptable for age; continue amlodipine, carvedilol and hydralazine 3.  OSA: Continuous pulse oximetry; CPAP [facility] 4.  CAD: Stable; continue aspirin and Plavix as well as  Imdur 5.  Diabetes mellitus type 2: Continue basal insulin adjusted for hospital diet.  Sliding cell insulin as well.  Hold oral hypoglycemic agents. 6.  BPH: Continue tamsulosin  7.  Gout: Continue colchicine 8.  DVT prophylaxis: Lovenox 9.  GI prophylaxis: Pantoprazole The patient is a full code.  Time spent on admission orders and patient care proximally 45 minutes  Harrie Foreman, MD 02/10/2018, 3:42 AM

## 2018-02-10 NOTE — Care Management Note (Signed)
Case Management Note  Patient Details  Name: Chase Cabrera MRN: [id number] Date of Birth: 01/23/1946  Subjective/Objective:     Admitted to [facility] under observation status with the diagnosis of altered mental status. Lives with wife, Chase Cabrera [phone]). Seen Dr. Jacklynn Lewis yesterday. Prescriptions are [facility] at [facility]. Receiving Skilled nursing and physical therapy per [facility] currently. Discharged from this facility to [facility] 12/17/17. Transferred to [facility] 2 days after being at [facility]. Discharged from [facility] 01/07/18. [facility] two other times. Rolling walker, scooter, cane, 4-prong cane, bedside commode, raised toilet seat, grab bars and shower seat in the home. Takes care of all basic activities of daily living himself, can drive, if needed.    Last fall was 2 weeks ago. Lost 20 pounds in the last month.            Action/Plan: Followed by [facility] currently   Expected Discharge Date:                  Expected Discharge Plan:     In-House Referral:     Discharge planning Services     Post Acute Care Choice:    Choice offered to:     DME Arranged:    DME Agency:     HH Arranged:    HH Agency:     Status of Service:     If discussed at [employer], dates discussed:    Additional Comments:  Chase Ammons, RN MSN CCM Care Management [phone] 02/10/2018, 10:25 AM

## 2018-02-11 DIAGNOSIS — G4733 Obstructive sleep apnea (adult) (pediatric): Secondary | ICD-10-CM | POA: Diagnosis not present

## 2018-02-11 DIAGNOSIS — I2510 Atherosclerotic heart disease of native coronary artery without angina pectoris: Secondary | ICD-10-CM | POA: Diagnosis not present

## 2018-02-11 DIAGNOSIS — J019 Acute sinusitis, unspecified: Secondary | ICD-10-CM | POA: Diagnosis not present

## 2018-02-11 DIAGNOSIS — I10 Essential (primary) hypertension: Secondary | ICD-10-CM | POA: Diagnosis not present

## 2018-02-11 LAB — PROTIME-INR
INR: 1.79
PROTHROMBIN TIME: 20.6 s — AB (ref 11.4–15.2)

## 2018-02-11 LAB — GLUCOSE, CAPILLARY
Glucose-Capillary: 214 mg/dL — ABNORMAL HIGH (ref 65–99)
Glucose-Capillary: 200 mg/dL — ABNORMAL HIGH (ref 65–99)
Glucose-Capillary: 227 mg/dL — ABNORMAL HIGH (ref 65–99)

## 2018-02-11 MED ORDER — GUAIFENESIN ER 600 MG PO TB12: 600.0000 mg | ORAL_TABLET | Freq: Two times a day (BID) | ORAL | 0 refills | Status: DC

## 2018-02-11 MED ORDER — SODIUM CHLORIDE 0.9 % IV SOLN
3.0000 g | Freq: Four times a day (QID) | INTRAVENOUS | Status: DC
  Administered 2018-02-11: 3 g via INTRAVENOUS
  Filled 2018-02-11 (×5): qty 3

## 2018-02-11 MED ORDER — FLUTICASONE PROPIONATE 50 MCG/ACT NA SUSP
2.0000 | Freq: Every day | NASAL | Status: DC
  Administered 2018-02-11: 2 via NASAL
  Filled 2018-02-11: qty 16

## 2018-02-11 MED ORDER — HYDROCOD POLST-CPM POLST ER 10-8 MG/5ML PO SUER
5.0000 mL | Freq: Two times a day (BID) | ORAL | Status: DC
  Administered 2018-02-11: 5 mL via ORAL
  Filled 2018-02-11: qty 5

## 2018-02-11 MED ORDER — OFLOXACIN 0.3 % OP SOLN: 1.0000 [drp] | Freq: Four times a day (QID) | OPHTHALMIC | 0 refills | Status: AC

## 2018-02-11 MED ORDER — FLUTICASONE PROPIONATE 50 MCG/ACT NA SUSP: 2.0000 | Freq: Every day | NASAL | 0 refills | Status: DC

## 2018-02-11 MED ORDER — HYDROCOD POLST-CPM POLST ER 10-8 MG/5ML PO SUER: 5.0000 mL | Freq: Two times a day (BID) | ORAL | 0 refills | Status: DC

## 2018-02-11 MED ORDER — AMOXICILLIN-POT CLAVULANATE 875-125 MG PO TABS: 1.0000 | ORAL_TABLET | Freq: Two times a day (BID) | ORAL | 0 refills | Status: DC

## 2018-02-11 NOTE — Progress Notes (Signed)
Discharge instructions along with home medications and follow up gone over with patient and wife. Both verbalize that they understood instructions. 1 prescription given to patient. IV removed. Pt being discharged home on room air, no distress noted. Ammie Dalton, RN

## 2018-02-11 NOTE — Evaluation (Signed)
Physical Therapy Evaluation Patient Details Name: Chase Cabrera MRN: [id number] DOB: Jun 01, 1946 Today's Date: 02/11/2018   History of Present Illness  Pt is a pleasant 72y/o male admitted for altered mental status and somnolence 2/2 sleep apnea. Pt recently had R THA 12/13/17 and is receiving HHPT 2x/week and a nurse comes to house 1x/week. Pt A&Ox4 today. Pt's PMH significant for the following: CAD status post PCI, atrial fibrillation, obstructive sleep apnea, Crohn's disease, diabetes   Clinical Impression  Pt much more alerted and oriented today and very willing to participate in PT. Pt with hx of R THA (anterior approach per pt) on 12/13/17. Pt reported prior to hospitalization he was IND in bathing, dressing, and feeding himself. Pt has experienced 1 fall in the last 6 months. Pt was ambulating in the house with RW. Pt performed bed mobility at MOD I level, as he utilized bedrails (which he also has at home), min guard to S during txfs and amb. To ensure safety. Pt's SaO2 (1L) in supine was 92-93%, incr. To 95% while standing, SpO2 removed and SaO2 was 92-94% after amb with no c/o of SOB. PT recommending HHPT at this time. Pt would benefit from skilled PT to address above deficits and promote optimal return to PLOF.    Follow Up Recommendations Home health PT;Supervision/Assistance - 24 hour    Equipment Recommendations  None recommended by PT    Recommendations for Other Services       Precautions / Restrictions Precautions Precautions: Fall;Other (comment);Anterior Hip(R THA on 12/13/17) Restrictions Weight Bearing Restrictions: No      Mobility  Bed Mobility Overal bed mobility: Modified Independent             General bed mobility comments: Pt utilized handrails to perform supine<>sit txfs, and has bedrails at home.   Transfers Overall transfer level: Needs assistance Equipment used: Rolling walker (2 wheeled) Transfers: Sit to/from Stand Sit to Stand: Supervision          General transfer comment: S to ensure safety during STS txfs with RW and cues for hand placement.   Ambulation/Gait Ambulation/Gait assistance: Supervision;Min guard Ambulation Distance (Feet): 70 Feet Assistive device: Rolling walker (2 wheeled) Gait Pattern/deviations: Step-through pattern;Decreased stride length;Decreased weight shift to right;Decreased dorsiflexion - right;Trunk flexed     General Gait Details: Pt progressed from amb. with RW with min guard to S for safety. Cues to improve stride length and upright posture.   Stairs            Wheelchair Mobility    Modified Rankin (Stroke Patients Only)       Balance Overall balance assessment: Needs assistance Sitting-balance support: No upper extremity supported;Feet unsupported Sitting balance-Leahy Scale: Good     Standing balance support: Bilateral upper extremity supported Standing balance-Leahy Scale: Fair Standing balance comment: utilized RW                             Pertinent Vitals/Pain Pain Assessment: No/denies pain    Home Living Family/patient expects to be discharged to:: Private residence Living Arrangements: Spouse/significant other;Other relatives Available Help at Discharge: Family;Available 24 hours/day Type of Home: House Home Access: Ramped entrance     Home Layout: One level Home Equipment: Walker - 2 wheels;Electric scooter;Shower seat;Bedside commode;Grab bars - tub/shower      Prior Function Level of Independence: Independent with assistive device(s)         Comments: Amb. household distances with RW  s/p R THA     Hand Dominance        Extremity/Trunk Assessment   Upper Extremity Assessment Upper Extremity Assessment: Overall WFL for tasks assessed    Lower Extremity Assessment Lower Extremity Assessment: Generalized weakness(Gross BLE MMT: 4/5.)       Communication   Communication: No difficulties  Cognition Arousal/Alertness:  Awake/alert Behavior During Therapy: WFL for tasks assessed/performed Overall Cognitive Status: Within Functional Limits for tasks assessed                                        General Comments      Exercises     Assessment/Plan    PT Assessment Patient needs continued PT services  PT Problem List Decreased strength;Decreased range of motion;Decreased activity tolerance;Decreased balance;Decreased mobility;Decreased knowledge of use of DME;Impaired sensation(pt reported intermittent N/T in feet, but light touch intact)       PT Treatment Interventions DME instruction;Gait training;Functional mobility training;Therapeutic activities;Therapeutic exercise;Balance training;Neuromuscular re-education;Patient/family education    PT Goals (Current goals can be found in the Care Plan section)  Acute Rehab PT Goals Patient Stated Goal: To go home PT Goal Formulation: With patient Time For Goal Achievement: 02/25/18 Potential to Achieve Goals: Good    Frequency Min 2X/week   Barriers to discharge   n/a    Co-evaluation               AM-PAC PT "6 Clicks" Daily Activity  Outcome Measure Difficulty turning over in bed (including adjusting bedclothes, sheets and blankets)?: None Difficulty moving from lying on back to sitting on the side of the bed? : A Little Difficulty sitting down on and standing up from a chair with arms (e.g., wheelchair, bedside commode, etc,.)?: A Little Help needed moving to and from a bed to chair (including a wheelchair)?: A Little Help needed walking in hospital room?: A Little Help needed climbing 3-5 steps with a railing? : A Little 6 Click Score: 19    End of Session Equipment Utilized During Treatment: Gait belt;Oxygen(SpO2 removed during amb. and SaO2 remained >/=92%, no SOB) Activity Tolerance: Patient tolerated treatment well Patient left: in bed;with bed alarm set;with call bell/phone within reach;with family/visitor  present Nurse Communication: Mobility status;Other (comment)(spoke with Hassan Rowan re: d/c and SaO2) PT Visit Diagnosis: Other abnormalities of gait and mobility (R26.89);Muscle weakness (generalized) (M62.81);History of falling (Z91.81)    Time: 4388-8757 PT Time Calculation (min) (ACUTE ONLY): 27 min   Charges:   PT Evaluation $PT Eval Low Complexity: 1 Low PT Treatments $Gait Training: 8-22 mins   PT G Codes:        Geoffry Paradise, PT,DPT 02/11/18 9:29 AM     Chase Cabrera L 02/11/2018, 9:22 AM

## 2018-02-11 NOTE — Progress Notes (Signed)
Pharmacy Antibiotic Note  Chase Cabrera is a 72 y.o. male admitted on 02/09/2018 with CAP/pneumonia.  Pharmacy has been consulted for Unasyn dosing.  Plan: Will start Unasyn 3g Q6H. Pharmacy will continue to follow.   Height: 5' 8"  (172.7 cm) Weight: 187 lb 11.2 oz (85.1 kg) IBW/kg (Calculated) : 68.4  Temp (24hrs), Avg:98.5 F (36.9 C), Min:98.3 F (36.8 C), Max:98.7 F (37.1 C)  Recent Labs  Lab 02/09/18 1654  WBC 9.9  CREATININE 0.86  LATICACIDVEN 1.7    Estimated Creatinine Clearance: 81.3 mL/min (by C-G formula based on SCr of 0.86 mg/dL).    Allergies  Allergen Reactions  . Allopurinol Diarrhea, Other (See Comments) and Nausea And Vomiting    Other Reaction: GI Upset  . Atenolol Other (See Comments)    Other Reaction: bradycardia  . Atorvastatin Other (See Comments)    Other Reaction: muscle aches  . Ramipril Rash  . Rosuvastatin Other (See Comments)    Muscle aches  . Simvastatin Other (See Comments)    Other Reaction: MUSCLE ACHES (Zocor)  . Valsartan Other (See Comments)    Other Reaction: facial swelling  . Cardizem [Diltiazem] Rash    Antimicrobials this admission: Unasyn 6/1 >>  Augmentin 5/31 >> 6/1    Dose adjustments this admission:   Microbiology results: BCx: 5/30>> NGTD  Thank you for allowing pharmacy to be a part of this patient's care.  Lendon Ka, PharmD Pharmacy Resident 02/11/2018 3:05 PM

## 2018-02-11 NOTE — Care Management Note (Signed)
Case Management Note  Patient Details  Name: Chase Cabrera MRN: [id number] Date of Birth: [birth year]  Subjective/Objective:   Pt to be discharged home with resumption of HHPT per MD order. Pt currently open with [facility] home care services and prefers to continue with them. RNCM spoke with Chase Cabrera with [facility] services for referral and she has resumed patients current HHPT orders to resume on Monday.   Chase Thoennes RN, BSN RNCM. [phone]             Action/Plan:   Expected Discharge Date:  02/11/18               Expected Discharge Plan:  [place]  In-House Referral:     Discharge planning Services  CM Consult  Post Acute Care Choice:  Home Health, Resumption of Svcs/PTA Provider Choice offered to:     DME Arranged:    DME Agency:     HH Arranged:    [place]:  [place]  Status of Service:  Completed, signed off  If discussed at [place] of Stay Meetings, dates discussed:    Additional Comments:  Chase Hellenbrand A, RN 02/11/2018, 4:59 PM

## 2018-02-13 ENCOUNTER — Telehealth: Payer: Self-pay | Admitting: Internal Medicine

## 2018-02-13 DIAGNOSIS — E1122 Type 2 diabetes mellitus with diabetic chronic kidney disease: Secondary | ICD-10-CM | POA: Diagnosis not present

## 2018-02-13 DIAGNOSIS — M48061 Spinal stenosis, lumbar region without neurogenic claudication: Secondary | ICD-10-CM | POA: Diagnosis not present

## 2018-02-13 DIAGNOSIS — I129 Hypertensive chronic kidney disease with stage 1 through stage 4 chronic kidney disease, or unspecified chronic kidney disease: Secondary | ICD-10-CM | POA: Diagnosis not present

## 2018-02-13 DIAGNOSIS — I480 Paroxysmal atrial fibrillation: Secondary | ICD-10-CM | POA: Diagnosis not present

## 2018-02-13 DIAGNOSIS — E1140 Type 2 diabetes mellitus with diabetic neuropathy, unspecified: Secondary | ICD-10-CM | POA: Diagnosis not present

## 2018-02-13 DIAGNOSIS — Z471 Aftercare following joint replacement surgery: Secondary | ICD-10-CM | POA: Diagnosis not present

## 2018-02-13 DIAGNOSIS — R197 Diarrhea, unspecified: Secondary | ICD-10-CM | POA: Diagnosis not present

## 2018-02-13 DIAGNOSIS — N182 Chronic kidney disease, stage 2 (mild): Secondary | ICD-10-CM | POA: Diagnosis not present

## 2018-02-13 DIAGNOSIS — I2510 Atherosclerotic heart disease of native coronary artery without angina pectoris: Secondary | ICD-10-CM | POA: Diagnosis not present

## 2018-02-13 NOTE — Telephone Encounter (Signed)
Transition Care Management Follow-up Telephone Call  How have you been since you were released from the hospital? Patient feeling better but stilll weak per patient wife.   Do you understand why you were in the hospital? yes   Do you understand the discharge instrcutions? yes  Items Reviewed:  Medications reviewed: yes  Allergies reviewed: yes  Dietary changes reviewed: yes  Referrals reviewed: yes   Functional Questionnaire:   Activities of Daily Living (ADLs):   He states they are independent in the following: ambulation, feeding, continence, grooming, toileting and dressing States they require assistance with the following: bathing and hygiene   Any transportation issues/concerns?: no   Any patient concerns? no   Confirmed importance and date/time of follow-up visits scheduled: yes   Confirmed with patient if condition begins to worsen call PCP or go to the ER.  Patient was given the Call-a-Nurse line [phone]: yes

## 2018-02-13 NOTE — Discharge Summary (Signed)
[place] [place] NAME: Chase Cabrera    MR#:  [id number]  DATE OF BIRTH:  [birth year]  DATE OF ADMISSION:  02/09/2018   ADMITTING PHYSICIAN: Harrie Foreman, MD  DATE OF DISCHARGE: 02/11/2018  6:07 PM  PRIMARY CARE PHYSICIAN: McLean-Scocuzza, Nino Glow, MD   ADMISSION DIAGNOSIS:   Somnolence [R40.0] Altered mental status, unspecified altered mental status type [R41.82]  DISCHARGE DIAGNOSIS:   Active Problems:   Altered mental status   SECONDARY DIAGNOSIS:   Past Medical History:  Diagnosis Date  . Arthritis   . Atrial fibrillation ([place])   . CAD (coronary artery disease)    6 STENTS  . CHF (congestive heart failure) ([place])   . Chicken pox   . Colon polyp   . Corneal dystrophy    bilateral  . Crohn's disease ([place])   . Diabetes ([place]) 2002  . Dysrhythmia    A-FIB AND PAF  . GERD (gastroesophageal reflux disease)   . Gout   . History of kidney stones   . History of shingles   . Hyperlipemia   . Hypertension   . Kidney stones   . Myocardial infarction ([place])    x4, last one in 2010  . Peripheral neuropathy   . Peripheral neuropathy   . Peripheral neuropathy   . PUD (peptic ulcer disease)   . Renal artery stenosis ([place])   . Renal artery stenosis ([place])   . Salzmann's nodular dystrophy 2012  . Sleep apnea   . Spinal stenosis     HOSPITAL COURSE:   72 year old male with past medical history significant for CAD, diabetes, CHF and GERD admitted to hospital secondary to excessive somnolence and acute sinusitis  1.  Acute sinusitis-also causing right eye conjunctivitis -No further fevers in the hospital.  Started on Augmentin. -Also continue  Ofloxacin eye drops - cough meds and steroid nasal spray ordered as well  2, Excessive somnolence- likely has underlying sleep apnea  - couldn't tolerate CPAP here - outpatient sleep study advised  3. CAD- stable. Continue cardiac meds at home  4. HTN- on hydralazine,  Imdur, coreg at home  5. DM- continue levemir at home  6. Afib- rate controlled, on warfarin   Ambulated well with PT- home health arranged at discharge  DISCHARGE CONDITIONS:   Guarded  CONSULTS OBTAINED:   None  DRUG ALLERGIES:   Allergies  Allergen Reactions  . Allopurinol Diarrhea, Other (See Comments) and Nausea And Vomiting    Other Reaction: GI Upset  . Atenolol Other (See Comments)    Other Reaction: bradycardia  . Atorvastatin Other (See Comments)    Other Reaction: muscle aches  . Ramipril Rash  . Rosuvastatin Other (See Comments)    Muscle aches  . Simvastatin Other (See Comments)    Other Reaction: MUSCLE ACHES (Zocor)  . Valsartan Other (See Comments)    Other Reaction: facial swelling  . Cardizem [Diltiazem] Rash   DISCHARGE MEDICATIONS:   Allergies as of 02/11/2018      Reactions   Allopurinol Diarrhea, Other (See Comments), Nausea And Vomiting   Other Reaction: GI Upset   Atenolol Other (See Comments)   Other Reaction: bradycardia   Atorvastatin Other (See Comments)   Other Reaction: muscle aches   Ramipril Rash   Rosuvastatin Other (See Comments)   Muscle aches   Simvastatin Other (See Comments)   Other Reaction: MUSCLE ACHES (Zocor)   Valsartan Other (See Comments)   Other Reaction: facial  swelling   Cardizem [diltiazem] Rash      Medication List    STOP taking these medications   HYDROcodone-acetaminophen 7.5-325 MG tablet Commonly known as:  NORCO   hydrocortisone 2.5 % cream   metFORMIN 1000 MG tablet Commonly known as:  GLUCOPHAGE     TAKE these medications   amLODipine 10 MG tablet Commonly known as:  NORVASC TAKE 1 TABLET BY MOUTH ONCE DAILY What changed:    how much to take  how to take this  when to take this   amoxicillin-clavulanate 875-125 MG tablet Commonly known as:  AUGMENTIN Take 1 tablet by mouth every 12 (twelve) hours for 10 days.   BENTYL 10 MG capsule Generic drug:  dicyclomine Take 10 mg by  mouth 3 (three) times daily before meals.   carvedilol 25 MG tablet Commonly known as:  COREG Take 1 tablet (25 mg total) by mouth 2 (two) times daily with a meal.   chlorpheniramine-HYDROcodone 10-8 MG/5ML Suer Commonly known as:  TUSSIONEX Take 5 mLs by mouth every 12 (twelve) hours.   Cholecalciferol 50000 units Tabs Take 1 tablet by mouth once a week. What changed:    how much to take  when to take this   cholestyramine light 4 g packet Commonly known as:  PREVALITE Take 4 g by mouth every morning.   clopidogrel 75 MG tablet Commonly known as:  PLAVIX Take 75 mg by mouth daily.   colchicine 0.6 MG tablet TAKE 1 TABLET BY MOUTH ONCE DAILY What changed:    how much to take  how to take this  when to take this   diclofenac sodium 1 % Gel Commonly known as:  VOLTAREN Apply 2 g topically 4 (four) times daily as needed (for pain).   fluticasone 50 MCG/ACT nasal spray Commonly known as:  FLONASE Place 2 sprays into both nostrils daily.   gabapentin 300 MG capsule Commonly known as:  NEURONTIN take 2 capsules (680m) by mouth three times a day   guaiFENesin 600 MG 12 hr tablet Commonly known as:  MUCINEX Take 1 tablet (600 mg total) by mouth 2 (two) times daily.   hydrALAZINE 100 MG tablet Commonly known as:  APRESOLINE TAKE 100 MG BY MOUTH THREE TIMES A DAY   insulin lispro 100 UNIT/ML KiwkPen Commonly known as:  HUMALOG Inject 14-32u under the skin three times daily before meals according to sliding scale   ipratropium 0.06 % nasal spray Commonly known as:  ATROVENT Place 2 sprays into both nostrils 4 (four) times daily.   isosorbide mononitrate 120 MG 24 hr tablet Commonly known as:  IMDUR TAKE 1 TABLET BY MOUTH EVERY DAY   LEVEMIR FLEXTOUCH 100 UNIT/ML Pen Generic drug:  Insulin Detemir Inject 80 Units into the skin daily at 10 pm.   levocetirizine 5 MG tablet Commonly known as:  XYZAL Take 1 tablet (5 mg total) by mouth every evening.     montelukast 10 MG tablet Commonly known as:  SINGULAIR Take 1 tablet (10 mg total) by mouth at bedtime.   MULTI-VITAMINS Tabs Take 1 tablet by mouth daily.   nitroGLYCERIN 0.4 MG SL tablet Commonly known as:  NITROSTAT Place 1 tablet (0.4 mg total) under the tongue every 5 (five) minutes x 3 doses as needed. For chest pain.  Call 911 if no relief after 3 tablets   ofloxacin 0.3 % ophthalmic solution Commonly known as:  OCUFLOX Place 1 drop into the right eye 4 (four) times daily for 7  days.   omeprazole 40 MG capsule Commonly known as:  PRILOSEC Take 40 mg by mouth 2 (two) times daily.   pravastatin 40 MG tablet Commonly known as:  PRAVACHOL TAKE 1 TABLET BY MOUTH ONCE DAILY   probenecid 500 MG tablet Commonly known as:  BENEMID Take 500 mg by mouth 2 (two) times daily.   silodosin 8 MG Caps capsule Commonly known as:  RAPAFLO Take 1 capsule (8 mg total) by mouth daily with breakfast.   vitamin C 500 MG tablet Commonly known as:  ASCORBIC ACID Take 500 mg by mouth daily.   Vitamin E 400 units Tabs Take 400 Units by mouth 2 (two) times daily.   warfarin 4 MG tablet Commonly known as:  COUMADIN Take 1 tablet (4 mg total) by mouth daily at 6 PM. Take 4 mg by mouth daily        DISCHARGE INSTRUCTIONS:   1. PCP f/u in 1 week  DIET:   Cardiac diet  ACTIVITY:   Activity as tolerated  OXYGEN:   Home Oxygen: No.  Oxygen Delivery: room air  DISCHARGE LOCATION:   home   If you experience worsening of your admission symptoms, develop shortness of breath, life threatening emergency, suicidal or homicidal thoughts you must seek medical attention immediately by calling 911 or calling your MD immediately  if symptoms less severe.  You Must read complete instructions/literature along with all the possible adverse reactions/side effects for all the Medicines you take and that have been prescribed to you. Take any new Medicines after you have completely understood  and accpet all the possible adverse reactions/side effects.   Please note  You were cared for by a hospitalist during your hospital stay. If you have any questions about your discharge medications or the care you received while you were in the hospital after you are discharged, you can call the unit and asked to speak with the hospitalist on call if the hospitalist that took care of you is not available. Once you are discharged, your primary care physician will handle any further medical issues. Please note that NO REFILLS for any discharge medications will be authorized once you are discharged, as it is imperative that you return to your primary care physician (or establish a relationship with a primary care physician if you do not have one) for your aftercare needs so that they can reassess your need for medications and monitor your lab values.    On the day of Discharge:  VITAL SIGNS:   Blood pressure 131/60, pulse 64, temperature 98.3 F (36.8 C), temperature source Oral, resp. rate 18, height 5' 8"  (1.727 m), weight 85.1 kg (187 lb 11.2 oz), SpO2 93 %.  PHYSICAL EXAMINATION:    GENERAL:  72 y.o.-year-old obese patient lying in the bed with no acute distress.  EYES: Pupils equal, round, reactive to light and accommodation. No scleral icterus. Extraocular muscles intact.  HEENT: Head atraumatic, normocephalic. Oropharynx and nasopharynx clear.  NECK:  Supple, no jugular venous distention. No thyroid enlargement, no tenderness.  LUNGS: Normal breath sounds bilaterally, no wheezing, rales,rhonchi or crepitation. No use of accessory muscles of respiration. Decreased bibasilar breath sounds CARDIOVASCULAR: S1, S2 normal. No murmurs, rubs, or gallops.  ABDOMEN: Soft, non-tender, non-distended. Bowel sounds present. No organomegaly or mass.  EXTREMITIES: No pedal edema, cyanosis, or clubbing.  NEUROLOGIC: Cranial nerves II through XII are intact. Muscle strength 5/5 in all extremities.  Sensation intact. Gait not checked.  PSYCHIATRIC: The patient is alert and  oriented x 3.  SKIN: No obvious rash, lesion, or ulcer.   DATA REVIEW:   CBC Recent Labs  Lab 02/09/18 1654  WBC 9.9  HGB 12.9*  HCT 37.4*  PLT 283    Chemistries  Recent Labs  Lab 02/09/18 1654  NA 134*  K 4.0  CL 99*  CO2 23  GLUCOSE 141*  BUN 17  CREATININE 0.86  CALCIUM 9.5  AST 25  ALT 17  ALKPHOS 29  BILITOT 0.7     Microbiology Results  Results for orders placed or performed during the hospital encounter of 02/09/18  Culture, blood (Routine x 2)     Status: None (Preliminary result)   Collection Time: 02/09/18  4:54 PM  Result Value Ref Range Status   Specimen Description BLOOD LEFT ANTECUBITAL  Final   Special Requests   Final    BOTTLES DRAWN AEROBIC AND ANAEROBIC Blood Culture results may not be optimal due to an excessive volume of blood received in culture bottles   Culture   Final    NO GROWTH 3 DAYS Performed at [facility], [address], [place], [place] [address]    Report Status PENDING  Incomplete    RADIOLOGY:  No results found.   Management plans discussed with the patient, family and they are in agreement.  CODE STATUS:  Code Status History    Date Active Date Inactive Code Status Order ID Comments User Context   02/09/2018 2250 02/11/2018 2112 Full Code [id number]  Harrie Foreman, MD Inpatient   12/13/2017 1814 12/17/2017 1708 Full Code [id number]  Hessie Knows, MD Inpatient   11/17/2015 1047 11/18/2015 1859 Full Code [id number]  Henreitta Leber, MD Inpatient    Advance Directive Documentation     Most Recent Value  Type of Advance Directive  Healthcare Power of [place], Living will  Pre-existing out of facility DNR order (yellow form or pink MOST form)  -  "MOST" Form in Place?  -      TOTAL TIME TAKING CARE OF THIS PATIENT: 38 minutes.    Gladstone Lighter M.D on 02/13/2018 at 3:18 PM  Between 7am to 6pm - Pager - [phone]  After 6pm  go to [email or web address] - Proofreader  [facility] [place]  Office  [phone]  CC: Primary care physician; McLean-Scocuzza, Nino Glow, MD   Note: This dictation was prepared with Dragon dictation along with smaller phrase technology. Any transcriptional errors that result from this process are unintentional.

## 2018-02-14 ENCOUNTER — Telehealth: Payer: Self-pay | Admitting: Internal Medicine

## 2018-02-14 LAB — CULTURE, BLOOD (ROUTINE X 2): CULTURE: NO GROWTH

## 2018-02-14 NOTE — Telephone Encounter (Signed)
Copied from [place] [id number]. Topic: Quick Communication - Rx Refill/Question >> Feb 14, 2018  4:41 PM Waylan Rocher, Lumin L wrote: Medication: amLODipine (NORVASC) 10 MG tablet   Has the patient contacted their pharmacy? Yes.   (Agent: If no, request that the patient contact the pharmacy for the refill.) (Agent: If yes, when and what did the pharmacy advise?)  Preferred Pharmacy (with phone number or street name): [id number] [id number]: [phone]: [phone] Agent: Please be advised that RX refills may take up to 3 business days. We ask that you follow-up with your pharmacy.

## 2018-02-15 MED ORDER — AMLODIPINE BESYLATE 10 MG PO TABS: 10.0000 mg | ORAL_TABLET | Freq: Every day | ORAL | 0 refills | Status: DC

## 2018-02-15 NOTE — Care Management (Signed)
Post discharge note entry 02/15/18 at 3:26P: RNCM received call from [facility] with Hospitalist requesting homehealth orders to be sent to [facility] care. Patient was in observation status. I have sent discharge summary with resumption of care along with observation notice to Al Decant with [facility] home care [phone].

## 2018-02-16 ENCOUNTER — Other Ambulatory Visit
Admission: RE | Admit: 2018-02-16 | Discharge: 2018-02-16 | Disposition: A | Discharge status: Home / Self Care | Payer: Medicare Other | Source: Ambulatory Visit | Attending: Internal Medicine | Admitting: Internal Medicine

## 2018-02-16 ENCOUNTER — Other Ambulatory Visit: Payer: Self-pay | Admitting: Internal Medicine

## 2018-02-16 ENCOUNTER — Ambulatory Visit (INDEPENDENT_AMBULATORY_CARE_PROVIDER_SITE_OTHER): Payer: Medicare Other | Admitting: Internal Medicine

## 2018-02-16 ENCOUNTER — Encounter: Payer: Self-pay | Admitting: Internal Medicine

## 2018-02-16 VITALS — BP 140/62 | HR 87 | Temp 98.4°F | Ht 68.0 in | Wt 184.8 lb

## 2018-02-16 DIAGNOSIS — Z7901 Long term (current) use of anticoagulants: Secondary | ICD-10-CM

## 2018-02-16 DIAGNOSIS — R42 Dizziness and giddiness: Secondary | ICD-10-CM

## 2018-02-16 DIAGNOSIS — R413 Other amnesia: Secondary | ICD-10-CM | POA: Diagnosis not present

## 2018-02-16 DIAGNOSIS — Z8679 Personal history of other diseases of the circulatory system: Secondary | ICD-10-CM

## 2018-02-16 DIAGNOSIS — G4733 Obstructive sleep apnea (adult) (pediatric): Secondary | ICD-10-CM

## 2018-02-16 DIAGNOSIS — R6883 Chills (without fever): Secondary | ICD-10-CM | POA: Diagnosis not present

## 2018-02-16 DIAGNOSIS — I4891 Unspecified atrial fibrillation: Secondary | ICD-10-CM

## 2018-02-16 DIAGNOSIS — I6523 Occlusion and stenosis of bilateral carotid arteries: Secondary | ICD-10-CM | POA: Diagnosis not present

## 2018-02-16 DIAGNOSIS — I482 Chronic atrial fibrillation, unspecified: Secondary | ICD-10-CM

## 2018-02-16 DIAGNOSIS — I2510 Atherosclerotic heart disease of native coronary artery without angina pectoris: Secondary | ICD-10-CM | POA: Diagnosis not present

## 2018-02-16 DIAGNOSIS — I6529 Occlusion and stenosis of unspecified carotid artery: Secondary | ICD-10-CM | POA: Insufficient documentation

## 2018-02-16 DIAGNOSIS — E1140 Type 2 diabetes mellitus with diabetic neuropathy, unspecified: Secondary | ICD-10-CM | POA: Diagnosis not present

## 2018-02-16 DIAGNOSIS — I779 Disorder of arteries and arterioles, unspecified: Secondary | ICD-10-CM

## 2018-02-16 DIAGNOSIS — I129 Hypertensive chronic kidney disease with stage 1 through stage 4 chronic kidney disease, or unspecified chronic kidney disease: Secondary | ICD-10-CM | POA: Diagnosis not present

## 2018-02-16 DIAGNOSIS — I480 Paroxysmal atrial fibrillation: Secondary | ICD-10-CM | POA: Diagnosis not present

## 2018-02-16 DIAGNOSIS — R55 Syncope and collapse: Secondary | ICD-10-CM

## 2018-02-16 DIAGNOSIS — I739 Peripheral vascular disease, unspecified: Secondary | ICD-10-CM

## 2018-02-16 DIAGNOSIS — I10 Essential (primary) hypertension: Secondary | ICD-10-CM

## 2018-02-16 DIAGNOSIS — R4182 Altered mental status, unspecified: Secondary | ICD-10-CM

## 2018-02-16 DIAGNOSIS — N182 Chronic kidney disease, stage 2 (mild): Secondary | ICD-10-CM | POA: Diagnosis not present

## 2018-02-16 DIAGNOSIS — Z471 Aftercare following joint replacement surgery: Secondary | ICD-10-CM | POA: Diagnosis not present

## 2018-02-16 DIAGNOSIS — E1122 Type 2 diabetes mellitus with diabetic chronic kidney disease: Secondary | ICD-10-CM | POA: Diagnosis not present

## 2018-02-16 LAB — PROTIME-INR
INR: 1.95
Prothrombin Time: 22.1 seconds — ABNORMAL HIGH (ref 11.4–15.2)

## 2018-02-16 MED ORDER — WARFARIN SODIUM 2 MG PO TABS: ORAL_TABLET | ORAL | 3 refills | Status: DC

## 2018-02-16 MED ORDER — WARFARIN SODIUM 4 MG PO TABS: 4.0000 mg | ORAL_TABLET | Freq: Every day | ORAL | 3 refills | Status: DC

## 2018-02-16 NOTE — Patient Instructions (Addendum)
F/u in 4-6 weeks  Disc with Dr. Melven Sartorius about Coumadin if he wants to change or stop  Coumadin check today at hospital and consider increase to 4 mg daily and 6 mg on Saturday and Sunday  Vitamin K Foods and Warfarin Warfarin is a blood thinner (anticoagulant). Anticoagulant medicines help prevent the formation of blood clots. These medicines work by decreasing the activity of vitamin K, which promotes normal blood clotting. When you take warfarin, problems can occur from suddenly increasing or decreasing the amount of vitamin K that you eat from one day to the next. Problems may include:  Blood clots.  Bleeding.  What general guidelines do I need to follow? To avoid problems when taking warfarin:  Eat a balanced diet that includes: ? Fresh fruits and vegetables. ? Whole grains. ? Low-fat dairy products. ? Lean proteins, such as fish, eggs, and lean cuts of meat.  Keep your intake of vitamin K consistent from day to day. To do this: ? Avoid eating large amounts of vitamin K one day and low amounts of vitamin K the next day. ? If you take a multivitamin that contains vitamin K, be sure to take it every day. ? Know which foods contain vitamin K. Use the lists below to understand serving sizes and the amount of vitamin K in one serving.  Avoid major changes in your diet. If you are going to change your diet, talk with your health care provider before making changes.  Work with a Financial planner (dietitian) to develop a meal plan that works best for you.  High vitamin K foods Foods that are high in vitamin K contain more than 100 mcg (micrograms) per serving. These include:  Broccoli (cooked) -  cup has 110 mcg.  Brussels sprouts (cooked) -  cup has 109 mcg.  Greens, beet (cooked) -  cup has 350 mcg.  Greens, collard (cooked) -  cup has 418 mcg.  Greens, turnip (cooked) -  cup has 265 mcg.  Green onions or scallions -  cup has 105 mcg.  Kale (fresh or frozen) -  cup  has 531 mcg.  Parsley (raw) - 10 sprigs has 164 mcg.  Spinach (cooked) -  cup has 444 mcg.  Swiss chard (cooked) -  cup has 287 mcg.  Moderate vitamin K foods Foods that have a moderate amount of vitamin K contain 25-100 mcg per serving. These include:  Asparagus (cooked) - 5 spears have 38 mcg.  Black-eyed peas (dried) -  cup has 32 mcg.  Cabbage (cooked) -  cup has 37 mcg.  Kiwi fruit - 1 medium has 31 mcg.  Lettuce - 1 cup has 57-63 mcg.  Okra (frozen) -  cup has 44 mcg.  Prunes (dried) - 5 prunes have 25 mcg.  Watercress (raw) - 1 cup has 85 mcg.  Low vitamin K foods Foods low in vitamin K contain less than 25 mcg per serving. These include:  Artichoke - 1 medium has 18 mcg.  Avocado - 1 oz. has 6 mcg.  Blueberries -  cup has 14 mcg.  Cabbage (raw) -  cup has 21 mcg.  Carrots (cooked) -  cup has 11 mcg.  Cauliflower (raw) -  cup has 11 mcg.  Cucumber with peel (raw) -  cup has 9 mcg.  Grapes -  cup has 12 mcg.  Mango - 1 medium has 9 mcg.  Nuts - 1 oz. has 15 mcg.  Pear - 1 medium has 8 mcg.  Peas (  cooked) -  cup has 19 mcg.  Pickles - 1 spear has 14 mcg.  Pumpkin seeds - 1 oz. has 13 mcg.  Sauerkraut (canned) -  cup has 16 mcg.  Soybeans (cooked) -  cup has 16 mcg.  Tomato (raw) - 1 medium has 10 mcg.  Tomato sauce -  cup has 17 mcg.  Vitamin K-free foods If a food contain less than 5 mcg per serving, it is considered to have no vitamin K. These foods include:  Bread and cereal products.  Cheese.  Eggs.  Fish and shellfish.  Meat and poultry.  Milk and dairy products.  Sunflower seeds.  Actual amounts of vitamin K in foods may be different depending on processing. Talk with your dietitian about what foods you can eat and what foods you should avoid. This information is not intended to replace advice given to you by your health care provider. Make sure you discuss any questions you have with your health care  provider. Document Released: 06/27/2009 Document Revised: 03/21/2016 Document Reviewed: 12/03/2015 Elsevier Interactive Patient Education  2018 [employer].  Dementia Dementia is the loss of two or more brain functions, such as:  Memory.  Decision making.  Behavior.  Speaking.  Thinking.  Problem solving.  There are many types of dementia. The most common type is called progressive dementia. Progressive dementia gets worse with time and it is irreversible. An example of this type of dementia is Alzheimer disease. What are the causes? This condition may be caused by:  Nerve cell damage in the brain.  Genetic mutations.  Certain medicines.  Multiple small strokes.  An infection, such as chronic meningitis.  A metabolic problem, such as vitamin B12 deficiency or thyroid disease.  Pressure on the brain, such as from a tumor or blood clot.  What are the signs or symptoms? Symptoms of this condition include:  Sudden changes in mood.  Depression.  Problems with balance.  Changes in personality.  Poor short-term memory.  Agitation.  Delusions.  Hallucinations.  Having a hard time: ? Speaking thoughts. ? Finding words. ? Solving problems. ? Doing familiar tasks. ? Understanding familiar ideas.  How is this diagnosed? This condition is diagnosed with an assessment by your health care provider. During this assessment, your health care provider will talk with you and your family, friends, or caregivers about your symptoms. A thorough medical history will be taken, and you will have a physical exam and tests. Tests may include:  Lab tests, such as blood or urine tests.  Imaging tests, such as a CT scan, PET scan, or MRI.  A lumbar puncture. This test involves removing and testing a small amount of the fluid that surrounds the brain and spinal cord.  An electroencephalogram (EEG). In this test, small metal discs are used to measure electrical activity in  the brain.  Memory tests, cognitive tests, and neuropsychological tests. These tests evaluate brain function.  How is this treated? Treatment depends on the cause of the dementia. It may involve taking medicines that may help:  To control the dementia.  To slow down the disease.  To manage symptoms.  In some cases, treating the cause of the dementia can improve symptoms, reverse symptoms, or slow down how quickly the dementia gets worse. Your health care provider can help direct you to support groups, organizations, and other health care providers who can help with decisions about your care. Follow these instructions at home: Medicine  Take over-the-counter and prescription medicines only as  told by your health care provider.  Avoid taking medicines that can affect thinking, such as pain or sleeping medicines. Lifestyle   Make healthy lifestyle choices: ? Be physically active as told by your health care provider. ? Do not use any tobacco products, such as cigarettes, chewing tobacco, and e-cigarettes. If you need help quitting, ask your health care provider. ? Eat a healthy diet. ? Practice stress-management techniques when you get stressed. ? Stay social.  Drink enough fluid to keep your urine clear or pale yellow.  Make sure to get quality sleep. These tips can help you to get a good night's rest: ? Avoid napping during the day. ? Keep your sleeping area dark and cool. ? Avoid exercising during the few hours before you go to bed. ? Avoid caffeine products in the evening. General instructions  Work with your health care provider to determine what you need help with and what your safety needs are.  If you were given a bracelet that tracks your location, make sure to wear it.  Keep all follow-up visits as told by your health care provider. This is important. Contact a health care provider if:  You have any new symptoms.  You have problems with choking or  swallowing.  You have any symptoms of a different illness. Get help right away if:  You develop a fever.  You have new or worsening confusion.  You have new or worsening sleepiness.  You have a hard time staying awake.  You or your family members become concerned for your safety. This information is not intended to replace advice given to you by your health care provider. Make sure you discuss any questions you have with your health care provider. Document Released: 02/23/2001 Document Revised: 01/08/2016 Document Reviewed: 05/28/2015 Elsevier Interactive Patient Education  [employer].

## 2018-02-16 NOTE — Progress Notes (Signed)
Chief Complaint  Patient presents with  . Hospitalization Follow-up   HFU with wife   5/30-6/1 for AMS cough, increased somnolence CXR stable changes cant exclude mild pulmonary vascular congestion no pneumonia CT head with mild thickening in right maxillary sinus. He reports still coughing yellowish phlegm but tussionex bid is helping and antibiotics made him feel better. CT chest 5/13 with chronic granulomatous disease and lung nodules appt with [facility] upcoming next week to disc this and OSA not using CPAP or bipap     CAS noted [place] 12/2017 L>R wants to see [facility] to discuss options  H/o CAD, Afib s/p ablation, presyncope recently and has appt with Dr. Melven Sartorius cardiology at [facility] 04/2018 will move appt up for further w/u presynope  -pt wants to know if can come off coumadin INR is subtherapeutic will check today   C/o memory loss, presyncope, AMS CT head reviewed 02/09/18 with moderate atrophy mild to moderate chronic small vessel ischemic changes. They would like referral to [facility]   Of note diarrhea improved with cholestyramine   Review of Systems  Constitutional: Negative for weight loss.  HENT: Positive for hearing loss.   Eyes: Negative for blurred vision.  Respiratory: Negative for shortness of breath.   Cardiovascular: Negative for chest pain.  Gastrointestinal: Negative for diarrhea.  Musculoskeletal: Positive for joint pain.       +right thigh and knee pain   Neurological: Negative for loss of consciousness and headaches.  Psychiatric/Behavioral: Positive for memory loss.       +confusion improved    Past Medical History:  Diagnosis Date  . Arthritis   . Atrial fibrillation ([place])   . CAD (coronary artery disease)    6 STENTS  . CHF (congestive heart failure) ([place])   . Chicken pox   . Colon polyp   . Corneal dystrophy    bilateral  . Crohn's disease ([place])   . Diabetes ([place]) 2002  . Dysrhythmia    A-FIB AND PAF  . GERD (gastroesophageal reflux disease)    . Gout   . History of kidney stones   . History of shingles   . Hyperlipemia   . Hypertension   . Kidney stones   . Myocardial infarction ([place])    x4, last one in 2010  . Peripheral neuropathy   . Peripheral neuropathy   . Peripheral neuropathy   . PUD (peptic ulcer disease)   . Renal artery stenosis ([place])   . Renal artery stenosis ([place])   . Salzmann's nodular dystrophy 2012  . Sleep apnea   . Spinal stenosis    Past Surgical History:  Procedure Laterality Date  . ABLATION  2012  . APPLICATION VERTERBRAL DEFECT PROSTHETIC  05/01/2013  . ARTHRODESIS ANTERIOR LUMBAR SPINE  05/01/2013  . BACK SURGERY     lumbar fusion  . CARDIAC CATHETERIZATION    . CARDIAC ELECTROPHYSIOLOGY STUDY AND ABLATION    . CARDIAC SURGERY    . CARDIOVERSION    . CHOLECYSTECTOMY    . COLONOSCOPY WITH PROPOFOL N/A 04/09/2016   Completed for chronic diarrhea.  Few scattered diverticuli.  No mucosal lesions.  Surgeon: Lollie Sails, MD;  Location: [facility] ENDOSCOPY;  Service: Endoscopy;  Laterality: N/A;  . CORNEAL EYE SURGERY Bilateral 07/28/11    09/22/2011  . CORONARY ANGIOPLASTY    . CORONARY ANGIOPLASTY WITH STENT PLACEMENT  03/28/2015   Distal 80% to normal Stent, Dilation Balloon  . CORONARY ARTERY BYPASS GRAFT     4  VESSELS  . EYE SURGERY     bilateral cataract  . foot and ankle repair Right 2005  . FRACTURE SURGERY     ANKLE PLATE AND SCREWS  . GALLBLADER    . JOINT REPLACEMENT     left total hip 11/11/15  . JOINT REPLACEMENT     right total hip 12/2017 Dr. Rudene Christians   . KNEE ARTHROSCOPY Right   . LUMBAR SPINE FUSION ONE LEVEL  05/03/2013  . RENAL ARTERY STENT Right   . ROTATOR CUFF REPAIR Right 2006  . TONSILLECTOMY    . TOTAL HIP ARTHROPLASTY Left 11/11/2015   Procedure: TOTAL HIP ARTHROPLASTY ANTERIOR APPROACH;  Surgeon: Hessie Knows, MD;  Location: [facility] ORS;  Service: Orthopedics;  Laterality: Left;  . TOTAL HIP ARTHROPLASTY Right 12/13/2017   Procedure: TOTAL HIP ARTHROPLASTY ANTERIOR  APPROACH;  Surgeon: Hessie Knows, MD;  Location: [facility] ORS;  Service: Orthopedics;  Laterality: Right;  . TRANSCATH PLACEMENT INTRAVASCULAR STENT LEG  03/2015   Family History  Problem Relation Age of Onset  . CVA Father   . Stroke Father   . Lung cancer Mother   . Arthritis Mother   . Stomach cancer Sister   . Colon cancer Sister   . Diabetes Brother    Social History   Socioeconomic History  . Marital status: Married    Spouse name: Not on file  . Number of children: Not on file  . Years of education: Not on file  . Highest education level: Not on file  Occupational History  . Not on file  Social Needs  . Financial resource strain: Not on file  . Food insecurity:    Worry: Not on file    Inability: Not on file  . Transportation needs:    Medical: Not on file    Non-medical: Not on file  Tobacco Use  . Smoking status: Former Smoker    Packs/day: 1.00    Years: 3.00    Pack years: 3.00    Types: Cigarettes    Last attempt to quit: 06/30/1962    Years since quitting: 55.6  . Smokeless tobacco: Former Systems developer    Types: [place] date: 12/05/1968  Substance and Sexual Activity  . Alcohol use: No  . Drug use: No  . Sexual activity: Not on file  Lifestyle  . Physical activity:    Days per week: Not on file    Minutes per session: Not on file  . Stress: Not on file  Relationships  . Social connections:    Talks on phone: Not on file    Gets together: Not on file    Attends religious service: Not on file    Active member of club or organization: Not on file    Attends meetings of clubs or organizations: Not on file    Relationship status: Not on file  . Intimate partner violence:    Fear of current or ex partner: Not on file    Emotionally abused: Not on file    Physically abused: Not on file    Forced sexual activity: Not on file  Other Topics Concern  . Not on file  Social History Narrative   Married    Current Meds  Medication Sig  . amLODipine  (NORVASC) 10 MG tablet Take 1 tablet (10 mg total) by mouth daily.  Marland Kitchen amoxicillin-clavulanate (AUGMENTIN) 875-125 MG tablet Take 1 tablet by mouth every 12 (twelve) hours for 10 days.  . carvedilol (COREG) 25  MG tablet Take 1 tablet (25 mg total) by mouth 2 (two) times daily with a meal.  . chlorpheniramine-HYDROcodone (TUSSIONEX) 10-8 MG/5ML SUER Take 5 mLs by mouth every 12 (twelve) hours.  . Cholecalciferol 50000 units TABS Take 1 tablet by mouth once a week. (Patient taking differently: Take 50,000 Units by mouth every Saturday. )  . cholestyramine light (PREVALITE) 4 g packet Take 4 g by mouth every morning.   . clopidogrel (PLAVIX) 75 MG tablet Take 75 mg by mouth daily.  . colchicine 0.6 MG tablet TAKE 1 TABLET BY MOUTH ONCE DAILY (Patient taking differently: TAKE 0.6 MG BY MOUTH ONCE DAILY)  . diclofenac sodium (VOLTAREN) 1 % GEL Apply 2 g topically 4 (four) times daily as needed (for pain).   Marland Kitchen dicyclomine (BENTYL) 10 MG capsule Take 10 mg by mouth 3 (three) times daily before meals.   . fluticasone (FLONASE) 50 MCG/ACT nasal spray Place 2 sprays into both nostrils daily.  Marland Kitchen gabapentin (NEURONTIN) 300 MG capsule take 2 capsules (625m) by mouth three times a day  . guaiFENesin (MUCINEX) 600 MG 12 hr tablet Take 1 tablet (600 mg total) by mouth 2 (two) times daily.  . hydrALAZINE (APRESOLINE) 100 MG tablet TAKE 100 MG BY MOUTH THREE TIMES A DAY  . insulin lispro (HUMALOG) 100 UNIT/ML KiwkPen Inject 14-32u under the skin three times daily before meals according to sliding scale  . ipratropium (ATROVENT) 0.06 % nasal spray Place 2 sprays into both nostrils 4 (four) times daily.  . isosorbide mononitrate (IMDUR) 120 MG 24 hr tablet TAKE 1 TABLET BY MOUTH EVERY DAY  . LEVEMIR FLEXTOUCH 100 UNIT/ML Pen Inject 80 Units into the skin daily at 10 pm.  . levocetirizine (XYZAL) 5 MG tablet Take 1 tablet (5 mg total) by mouth every evening.  . montelukast (SINGULAIR) 10 MG tablet Take 1 tablet (10 mg  total) by mouth at bedtime.  . Multiple Vitamin (MULTI-VITAMINS) TABS Take 1 tablet by mouth daily.  . nitroGLYCERIN (NITROSTAT) 0.4 MG SL tablet Place 1 tablet (0.4 mg total) under the tongue every 5 (five) minutes x 3 doses as needed. For chest pain.  Call 911 if no relief after 3 tablets  . ofloxacin (OCUFLOX) 0.3 % ophthalmic solution Place 1 drop into the right eye 4 (four) times daily for 7 days.  .Marland Kitchenomeprazole (PRILOSEC) 40 MG capsule Take 40 mg by mouth 2 (two) times daily.   . pravastatin (PRAVACHOL) 40 MG tablet TAKE 1 TABLET BY MOUTH ONCE DAILY  . probenecid (BENEMID) 500 MG tablet Take 500 mg by mouth 2 (two) times daily.   . silodosin (RAPAFLO) 8 MG CAPS capsule Take 1 capsule (8 mg total) by mouth daily with breakfast.  . vitamin C (ASCORBIC ACID) 500 MG tablet Take 500 mg by mouth daily.   . Vitamin E 400 units TABS Take 400 Units by mouth 2 (two) times daily.  .Marland Kitchenwarfarin (COUMADIN) 4 MG tablet Take 1 tablet (4 mg total) by mouth daily at 6 PM. Take 4 mg by mouth daily   Allergies  Allergen Reactions  . Allopurinol Diarrhea, Other (See Comments) and Nausea And Vomiting    Other Reaction: GI Upset  . Atenolol Other (See Comments)    Other Reaction: bradycardia  . Atorvastatin Other (See Comments)    Other Reaction: muscle aches  . Ramipril Rash  . Rosuvastatin Other (See Comments)    Muscle aches  . Simvastatin Other (See Comments)    Other Reaction: MUSCLE  ACHES (Zocor)  . Valsartan Other (See Comments)    Other Reaction: facial swelling  . Cardizem [Diltiazem] Rash   Recent Results (from the past 2160 hour(s))  Glucose, capillary     Status: Abnormal   Collection Time: 11/30/17 10:23 AM  Result Value Ref Range   Glucose-Capillary 113 (H) 65 - 99 mg/dL  Glucose, capillary     Status: Abnormal   Collection Time: 12/13/17 12:00 PM  Result Value Ref Range   Glucose-Capillary 263 (H) 65 - 99 mg/dL  Type and screen [place]     Status: None    Collection Time: 12/13/17 12:32 PM  Result Value Ref Range   ABO/RH(D) A POS    Antibody Screen NEG    Sample Expiration      12/16/2017 Performed at [facility], [address] [place], [place] [address]   Protime-INR     Status: None   Collection Time: 12/13/17 12:32 PM  Result Value Ref Range   Prothrombin Time 14.3 11.4 - 15.2 seconds   INR 1.12     Comment: Performed at [facility], [address] [place], [place] [address]  Surgical pathology     Status: None   Collection Time: 12/13/17  4:03 PM  Result Value Ref Range   SURGICAL PATHOLOGY      Surgical Pathology CASE: [id number] PATIENT: Hilton Sinclair Surgical Pathology Report     SPECIMEN SUBMITTED: A. Femoral head, right  CLINICAL HISTORY: None provided  PRE-OPERATIVE DIAGNOSIS: Primary osteoarthritis of right hip  POST-OPERATIVE DIAGNOSIS: None provided.     DIAGNOSIS: A. RIGHT FEMORAL HEAD; ARTHROPLASTY: - OSTEOARTHRITIS.   GROSS DESCRIPTION:  A. Labeled: right femoral head  Size of specimen:      Head -4.5 x 4.6 cm      Neck -3.2 x 2.9 cm  Articular surface: yellow-tan with focal disruption  Cut surface: pink-tan  Other findings: received in formalin  Block summary: 1 - representative section(s), post decalcification    Final Diagnosis performed by Delorse Lek, MD.  Electronically signed 12/16/2017 1:21:33PM    The electronic signature indicates that the named Attending Pathologist has evaluated the specimen  Technical component performed at [facility], [address], [place], [place] [address] Lab: [phone] Dir: Sa Lenard Galloway, MD, MMM  Professional component performed at [facility], [facility], [place], [place], [place] [address] Lab: [phone] Dir: Dellia Nims. Rubinas, MD    Glucose, capillary     Status: Abnormal   Collection Time: 12/13/17  4:42 PM  Result Value Ref Range   Glucose-Capillary 168 (H) 65 - 99 mg/dL   Glucose, capillary     Status: Abnormal   Collection Time: 12/13/17  7:01 PM  Result Value Ref Range   Glucose-Capillary 254 (H) 65 - 99 mg/dL   Comment 1 Notify RN   Glucose, capillary     Status: Abnormal   Collection Time: 12/13/17  9:00 PM  Result Value Ref Range   Glucose-Capillary 223 (H) 65 - 99 mg/dL   Comment 1 Notify RN   Protime-INR     Status: None   Collection Time: 12/14/17  4:48 AM  Result Value Ref Range   Prothrombin Time 14.6 11.4 - 15.2 seconds   INR 1.15     Comment: Performed at [facility], [address] [place], [place] [address]  Glucose, capillary     Status: Abnormal   Collection Time: 12/14/17  7:57 AM  Result Value Ref Range  Glucose-Capillary 241 (H) 65 - 99 mg/dL  CBC     Status: Abnormal   Collection Time: 12/14/17 10:16 AM  Result Value Ref Range   WBC 8.8 3.8 - 10.6 K/uL   RBC 3.35 (L) 4.40 - 5.90 MIL/uL   Hemoglobin 11.5 (L) 13.0 - 18.0 g/dL   HCT 33.8 (L) 40.0 - 52.0 %   MCV 101.1 (H) 80.0 - 100.0 fL   MCH 34.2 (H) 26.0 - 34.0 pg   MCHC 33.9 32.0 - 36.0 g/dL   RDW 13.9 11.5 - 14.5 %   Platelets 222 150 - 440 K/uL    Comment: Performed at [facility], [address] [place], [place] [address]  Basic metabolic panel     Status: Abnormal   Collection Time: 12/14/17 11:08 AM  Result Value Ref Range   Sodium 133 (L) 135 - 145 mmol/L   Potassium 3.6 3.5 - 5.1 mmol/L   Chloride 100 (L) 101 - 111 mmol/L   CO2 24 22 - 32 mmol/L   Glucose, Bld 279 (H) 65 - 99 mg/dL   BUN 14 6 - 20 mg/dL   Creatinine, Ser 1.06 0.61 - 1.24 mg/dL   Calcium 8.3 (L) 8.9 - 10.3 mg/dL   GFR calc non Af Amer >60 >60 mL/min   GFR calc Af Amer >60 >60 mL/min    Comment: (NOTE) The eGFR has been calculated using the CKD EPI equation. This calculation has not been validated in all clinical situations. eGFR's persistently <60 mL/min signify possible Chronic Kidney Disease.    Anion gap 9 5 - 15    Comment: Performed at [facility],  [address] [place], [place] [address]  Glucose, capillary     Status: Abnormal   Collection Time: 12/14/17 11:57 AM  Result Value Ref Range   Glucose-Capillary 260 (H) 65 - 99 mg/dL  Glucose, capillary     Status: Abnormal   Collection Time: 12/14/17  4:14 PM  Result Value Ref Range   Glucose-Capillary 213 (H) 65 - 99 mg/dL   Comment 1 Notify RN   Glucose, capillary     Status: Abnormal   Collection Time: 12/14/17  6:01 PM  Result Value Ref Range   Glucose-Capillary 195 (H) 65 - 99 mg/dL  CULTURE, BLOOD (ROUTINE X 2) w Reflex to ID Panel     Status: None   Collection Time: 12/14/17  6:08 PM  Result Value Ref Range   Specimen Description BLOOD BLOOD RIGHT ARM    Special Requests      BOTTLES DRAWN AEROBIC AND ANAEROBIC Blood Culture adequate volume   Culture      NO GROWTH 5 DAYS Performed at [facility], [address] [place], [place] [address]    Report Status 12/19/2017 FINAL   Blood gas, venous     Status: Abnormal   Collection Time: 12/14/17  6:30 PM  Result Value Ref Range   pH, Ven 7.44 (H) 7.250 - 7.430   pCO2, Ven 39 (L) 44.0 - 60.0 mmHg   pO2, Ven 41.0 32.0 - 45.0 mmHg   Bicarbonate 26.5 20.0 - 28.0 mmol/L   Acid-Base Excess 2.2 (H) 0.0 - 2.0 mmol/L   O2 Saturation 78.4 %   Patient temperature 37.0    Collection site VEIN    Sample type VENOUS     Comment: Performed at [facility], [address] [place], [place] [address]  CBC     Status: Abnormal   Collection Time:  12/14/17  6:32 PM  Result Value Ref Range   WBC 8.6 3.8 - 10.6 K/uL   RBC 3.45 (L) 4.40 - 5.90 MIL/uL   Hemoglobin 11.7 (L) 13.0 - 18.0 g/dL   HCT 34.2 (L) 40.0 - 52.0 %   MCV 99.1 80.0 - 100.0 fL   MCH 33.9 26.0 - 34.0 pg   MCHC 34.2 32.0 - 36.0 g/dL   RDW 13.6 11.5 - 14.5 %   Platelets 204 150 - 440 K/uL    Comment: Performed at [facility], [address] [place], [place] [address]  Basic metabolic panel     Status: Abnormal   Collection Time:  12/14/17  6:32 PM  Result Value Ref Range   Sodium 134 (L) 135 - 145 mmol/L   Potassium 4.0 3.5 - 5.1 mmol/L   Chloride 100 (L) 101 - 111 mmol/L   CO2 24 22 - 32 mmol/L   Glucose, Bld 224 (H) 65 - 99 mg/dL   BUN 13 6 - 20 mg/dL   Creatinine, Ser 0.93 0.61 - 1.24 mg/dL   Calcium 8.7 (L) 8.9 - 10.3 mg/dL   GFR calc non Af Amer >60 >60 mL/min   GFR calc Af Amer >60 >60 mL/min    Comment: (NOTE) The eGFR has been calculated using the CKD EPI equation. This calculation has not been validated in all clinical situations. eGFR's persistently <60 mL/min signify possible Chronic Kidney Disease.    Anion gap 10 5 - 15    Comment: Performed at [facility], [address] [place], [place] [address]  APTT     Status: None   Collection Time: 12/14/17  6:32 PM  Result Value Ref Range   aPTT 30 24 - 36 seconds    Comment: Performed at [facility], [address] [place], [place] [address]  Blood gas, arterial     Status: Abnormal   Collection Time: 12/14/17  7:52 PM  Result Value Ref Range   FIO2 0.36    Delivery systems NASAL CANNULA    pH, Arterial 7.45 7.350 - 7.450   pCO2 arterial 37 32.0 - 48.0 mmHg   pO2, Arterial 88 83.0 - 108.0 mmHg   Bicarbonate 26.9 20.0 - 28.0 mmol/L   Acid-Base Excess 3.2 (H) 0.0 - 2.0 mmol/L   O2 Saturation 96.7 %   Patient temperature 38.1    Collection site RIGHT RADIAL    Sample type ARTERIAL DRAW    Allens test (pass/fail) PASS PASS    Comment: Performed at [facility], [address] [place], [place] [address]  Urinalysis, Complete w Microscopic     Status: Abnormal   Collection Time: 12/14/17 10:12 PM  Result Value Ref Range   Color, Urine STRAW (A) YELLOW   APPearance CLEAR (A) CLEAR   Specific Gravity, Urine 1.006 1.005 - 1.030   pH 5.0 5.0 - 8.0   Glucose, UA NEGATIVE NEGATIVE mg/dL   Hgb urine dipstick NEGATIVE NEGATIVE   Bilirubin Urine NEGATIVE NEGATIVE   Ketones, ur NEGATIVE NEGATIVE mg/dL   Protein, ur  NEGATIVE NEGATIVE mg/dL   Nitrite NEGATIVE NEGATIVE   Leukocytes, UA NEGATIVE NEGATIVE   RBC / HPF NONE SEEN 0 - 5 RBC/hpf   WBC, UA 0-5 0 - 5 WBC/hpf   Bacteria, UA NONE SEEN NONE SEEN   Squamous Epithelial / LPF NONE SEEN NONE SEEN    Comment: Performed at [facility], [address] [place], [place] [address]  Urine Culture  Status: None   Collection Time: 12/14/17 10:12 PM  Result Value Ref Range   Specimen Description      URINE, RANDOM Performed at [facility], [address], [place], [place] [address]    Special Requests      NONE Performed at [facility], [address] [place], [place] [address]    Culture      NO GROWTH Performed at [facility], [place] [address], [place], [place] [address]    Report Status 12/16/2017 FINAL   Lactic acid, plasma     Status: None   Collection Time: 12/15/17 12:24 AM  Result Value Ref Range   Lactic Acid, Venous 1.2 0.5 - 1.9 mmol/L    Comment: Performed at [facility], [address] [place], [place] [address]  CULTURE, BLOOD (ROUTINE X 2) w Reflex to ID Panel     Status: None   Collection Time: 12/15/17 12:24 AM  Result Value Ref Range   Specimen Description BLOOD RIGHT HAND    Special Requests      BOTTLES DRAWN AEROBIC AND ANAEROBIC Blood Culture results may not be optimal due to an excessive volume of blood received in culture bottles   Culture      NO GROWTH 5 DAYS Performed at [facility], [address] [place], [place] [address]    Report Status 12/20/2017 FINAL   Procalcitonin - Baseline     Status: None   Collection Time: 12/15/17 12:24 AM  Result Value Ref Range   Procalcitonin 0.21 ng/mL    Comment:        Interpretation: PCT (Procalcitonin) <= 0.5 ng/mL: Systemic infection (sepsis) is not likely. Local bacterial infection is possible. (NOTE)       Sepsis PCT Algorithm           Lower Respiratory Tract                                       Infection PCT Algorithm    ----------------------------     ----------------------------         PCT < 0.25 ng/mL                PCT < 0.10 ng/mL         Strongly encourage             Strongly discourage   discontinuation of antibiotics    initiation of antibiotics    ----------------------------     -----------------------------       PCT 0.25 - 0.50 ng/mL            PCT 0.10 - 0.25 ng/mL               OR       >80% decrease in PCT            Discourage initiation of                                            antibiotics      Encourage discontinuation           of antibiotics    ----------------------------     -----------------------------         PCT >= 0.50 ng/mL  PCT 0.26 - 0.50 ng/mL               AND        <80% decrease in PCT             Encourage initiation of                                             antibiotics       Encourage continuation           of antibiotics    ----------------------------     -----------------------------        PCT >= 0.50 ng/mL                  PCT > 0.50 ng/mL               AND         increase in PCT                  Strongly encourage                                      initiation of antibiotics    Strongly encourage escalation           of antibiotics                                     -----------------------------                                           PCT <= 0.25 ng/mL                                                 OR                                        > 80% decrease in PCT                                     Discontinue / Do not initiate                                             antibiotics Performed at [facility], [address] [place], [place] [address]   Protime-INR     Status: None   Collection Time: 12/15/17  7:28 AM  Result Value Ref Range   Prothrombin Time 13.6 11.4 - 15.2 seconds   INR 1.05     Comment: Performed at [facility], [address] [place], [place] [address]   Procalcitonin     Status: None   Collection Time: 12/15/17  7:28 AM  Result  Value Ref Range   Procalcitonin 0.23 ng/mL    Comment:        Interpretation: PCT (Procalcitonin) <= 0.5 ng/mL: Systemic infection (sepsis) is not likely. Local bacterial infection is possible. (NOTE)       Sepsis PCT Algorithm           Lower Respiratory Tract                                      Infection PCT Algorithm    ----------------------------     ----------------------------         PCT < 0.25 ng/mL                PCT < 0.10 ng/mL         Strongly encourage             Strongly discourage   discontinuation of antibiotics    initiation of antibiotics    ----------------------------     -----------------------------       PCT 0.25 - 0.50 ng/mL            PCT 0.10 - 0.25 ng/mL               OR       >80% decrease in PCT            Discourage initiation of                                            antibiotics      Encourage discontinuation           of antibiotics    ----------------------------     -----------------------------         PCT >= 0.50 ng/mL              PCT 0.26 - 0.50 ng/mL               AND        <80% decrease in PCT             Encourage initiation of                                             antibiotics       Encourage continuation           of antibiotics    ----------------------------     -----------------------------        PCT >= 0.50 ng/mL                  PCT > 0.50 ng/mL               AND         increase in PCT                  Strongly encourage                                      initiation of antibiotics    Strongly encourage escalation           of  antibiotics                                     -----------------------------                                           PCT <= 0.25 ng/mL                                                 OR                                        > 80% decrease in PCT                                     Discontinue / Do not  initiate                                             antibiotics Performed at [facility], [address] [place], [place] [address]   Glucose, capillary     Status: Abnormal   Collection Time: 12/15/17  8:22 AM  Result Value Ref Range   Glucose-Capillary 212 (H) 65 - 99 mg/dL  Glucose, capillary     Status: Abnormal   Collection Time: 12/15/17 11:41 AM  Result Value Ref Range   Glucose-Capillary 232 (H) 65 - 99 mg/dL  Glucose, capillary     Status: Abnormal   Collection Time: 12/15/17  5:43 PM  Result Value Ref Range   Glucose-Capillary 237 (H) 65 - 99 mg/dL  Glucose, capillary     Status: Abnormal   Collection Time: 12/15/17  9:38 PM  Result Value Ref Range   Glucose-Capillary 247 (H) 65 - 99 mg/dL   Comment 1 Notify RN   Protime-INR     Status: Abnormal   Collection Time: 12/16/17  4:40 AM  Result Value Ref Range   Prothrombin Time 15.3 (H) 11.4 - 15.2 seconds   INR 1.22     Comment: Performed at [facility], [address] [place], [place] [address]  Procalcitonin     Status: None   Collection Time: 12/16/17  4:40 AM  Result Value Ref Range   Procalcitonin 0.36 ng/mL    Comment:        Interpretation: PCT (Procalcitonin) <= 0.5 ng/mL: Systemic infection (sepsis) is not likely. Local bacterial infection is possible. (NOTE)       Sepsis PCT Algorithm           Lower Respiratory Tract                                      Infection PCT Algorithm    ----------------------------     ----------------------------         PCT < 0.25 ng/mL  PCT < 0.10 ng/mL         Strongly encourage             Strongly discourage   discontinuation of antibiotics    initiation of antibiotics    ----------------------------     -----------------------------       PCT 0.25 - 0.50 ng/mL            PCT 0.10 - 0.25 ng/mL               OR       >80% decrease in PCT            Discourage initiation of                                            antibiotics       Encourage discontinuation           of antibiotics    ----------------------------     -----------------------------         PCT >= 0.50 ng/mL              PCT 0.26 - 0.50 ng/mL               AND        <80% decrease in PCT             Encourage initiation of                                             antibiotics       Encourage continuation           of antibiotics    ----------------------------     -----------------------------        PCT >= 0.50 ng/mL                  PCT > 0.50 ng/mL               AND         increase in PCT                  Strongly encourage                                      initiation of antibiotics    Strongly encourage escalation           of antibiotics                                     -----------------------------                                           PCT <= 0.25 ng/mL                                                 OR                                        >  80% decrease in PCT                                     Discontinue / Do not initiate                                             antibiotics Performed at [facility], [address] [place], [place] [address]   CBC with Differential/Platelet     Status: Abnormal   Collection Time: 12/16/17  4:40 AM  Result Value Ref Range   WBC 10.4 3.8 - 10.6 K/uL   RBC 2.80 (L) 4.40 - 5.90 MIL/uL   Hemoglobin 9.6 (L) 13.0 - 18.0 g/dL   HCT 27.5 (L) 40.0 - 52.0 %   MCV 98.5 80.0 - 100.0 fL   MCH 34.2 (H) 26.0 - 34.0 pg   MCHC 34.7 32.0 - 36.0 g/dL   RDW 13.4 11.5 - 14.5 %   Platelets 190 150 - 440 K/uL   Neutrophils Relative % 73 %   Neutro Abs 7.6 (H) 1.4 - 6.5 K/uL   Lymphocytes Relative 10 %   Lymphs Abs 1.1 1.0 - 3.6 K/uL   Monocytes Relative 13 %   Monocytes Absolute 1.3 (H) 0.2 - 1.0 K/uL   Eosinophils Relative 4 %   Eosinophils Absolute 0.4 0 - 0.7 K/uL   Basophils Relative 0 %   Basophils Absolute 0.0 0 - 0.1 K/uL    Comment: Performed at [facility], [address] [place], [place] [address]  Basic metabolic panel     Status: Abnormal   Collection Time: 12/16/17  4:40 AM  Result Value Ref Range   Sodium 132 (L) 135 - 145 mmol/L   Potassium 3.5 3.5 - 5.1 mmol/L   Chloride 96 (L) 101 - 111 mmol/L   CO2 28 22 - 32 mmol/L   Glucose, Bld 208 (H) 65 - 99 mg/dL   BUN 15 6 - 20 mg/dL   Creatinine, Ser 0.96 0.61 - 1.24 mg/dL   Calcium 8.0 (L) 8.9 - 10.3 mg/dL   GFR calc non Af Amer >60 >60 mL/min   GFR calc Af Amer >60 >60 mL/min    Comment: (NOTE) The eGFR has been calculated using the CKD EPI equation. This calculation has not been validated in all clinical situations. eGFR's persistently <60 mL/min signify possible Chronic Kidney Disease.    Anion gap 8 5 - 15    Comment: Performed at [facility], [address] [place], [place] [address]  Glucose, capillary     Status: Abnormal   Collection Time: 12/16/17  7:45 AM  Result Value Ref Range   Glucose-Capillary 210 (H) 65 - 99 mg/dL   Comment 1 Notify RN   ECHOCARDIOGRAM COMPLETE     Status: None   Collection Time: 12/16/17  9:47 AM  Result Value Ref Range   Weight 3,326.3 oz   Height 68 in   BP 139/56 mmHg  Glucose, capillary     Status: Abnormal   Collection Time: 12/16/17 12:20 PM  Result Value Ref Range   Glucose-Capillary 225 (H) 65 - 99 mg/dL   Comment 1 Notify RN   Glucose, capillary     Status: Abnormal   Collection Time: 12/16/17  4:34 PM  Result Value  Ref Range   Glucose-Capillary 194 (H) 65 - 99 mg/dL   Comment 1 Notify RN   Glucose, capillary     Status: Abnormal   Collection Time: 12/16/17  9:11 PM  Result Value Ref Range   Glucose-Capillary 182 (H) 65 - 99 mg/dL   Comment 1 Notify RN   Protime-INR     Status: Abnormal   Collection Time: 12/17/17  3:24 AM  Result Value Ref Range   Prothrombin Time 15.6 (H) 11.4 - 15.2 seconds   INR 1.25     Comment: Performed at [facility], [address] [place], [place] [address]  Glucose,  capillary     Status: Abnormal   Collection Time: 12/17/17  7:35 AM  Result Value Ref Range   Glucose-Capillary 186 (H) 65 - 99 mg/dL   Comment 1 Notify RN   Glucose, capillary     Status: Abnormal   Collection Time: 12/17/17 11:46 AM  Result Value Ref Range   Glucose-Capillary 232 (H) 65 - 99 mg/dL   Comment 1 Notify RN   VITAMIN D 25 Hydroxy (Vit-D Deficiency, Fractures)     Status: None   Collection Time: 01/12/18  4:23 PM  Result Value Ref Range   Vit D, 25-Hydroxy 37.8 30.0 - 100.0 ng/mL    Comment: (NOTE) Vitamin D deficiency has been defined by the Institute of Medicine and an Endocrine Society practice guideline as a level of serum 25-OH vitamin D less than 20 ng/mL (1,2). The Endocrine Society went on to further define vitamin D insufficiency as a level between 21 and 29 ng/mL (2). 1. IOM (Institute of Medicine). 2010. Dietary reference   intakes for calcium and D. [place]: The   [employer]. 2. Holick MF, Binkley Grover, Bischoff-Ferrari HA, et al.   Evaluation, treatment, and prevention of vitamin D   deficiency: an Endocrine Society clinical practice   guideline. JCEM. 2011 Jul; 96(7):1911-30. Performed At: [facility] [place], [place] [id number] Rush Farmer MD [id number] Performed at [facility], [address] [place], [place] [address]   Hemoglobin A1c     Status: Abnormal   Collection Time: 01/12/18  4:23 PM  Result Value Ref Range   Hgb A1c MFr Bld 5.8 (H) 4.8 - 5.6 %    Comment: (NOTE) Pre diabetes:          5.7%-6.4% Diabetes:              >6.4% Glycemic control for   <7.0% adults with diabetes    Mean Plasma Glucose 119.76 mg/dL    Comment: Performed at [facility] [address], [place], [place] [address]  Protime-INR     Status: Abnormal   Collection Time: 01/12/18  4:23 PM  Result Value Ref Range   Prothrombin Time 18.3 (H) 11.4 - 15.2 seconds   INR 1.53     Comment: Performed at  [facility], [address] [place],  [address]  CBC with Differential/Platelet     Status: Abnormal   Collection Time: 01/12/18  4:23 PM  Result Value Ref Range   WBC 7.1 3.8 - 10.6 K/uL   RBC 3.60 (L) 4.40 - 5.90 MIL/uL   Hemoglobin 12.3 (L) 13.0 - 18.0 g/dL   HCT 35.9 (L) 40.0 - 52.0 %   MCV 99.5 80.0 - 100.0 fL   MCH 34.3 (H) 26.0 - 34.0 pg   MCHC 34.4 32.0 - 36.0 g/dL   RDW 15.3 (  H) 11.5 - 14.5 %   Platelets 274 150 - 440 K/uL   Neutrophils Relative % 70 %   Neutro Abs 5.0 1.4 - 6.5 K/uL   Lymphocytes Relative 15 %   Lymphs Abs 1.1 1.0 - 3.6 K/uL   Monocytes Relative 11 %   Monocytes Absolute 0.8 0.2 - 1.0 K/uL   Eosinophils Relative 4 %   Eosinophils Absolute 0.3 0 - 0.7 K/uL   Basophils Relative 0 %   Basophils Absolute 0.0 0 - 0.1 K/uL    Comment: Performed at [facility], [address] [place], [place] [address]  Comprehensive metabolic panel     Status: Abnormal   Collection Time: 01/12/18  4:23 PM  Result Value Ref Range   Sodium 138 135 - 145 mmol/L   Potassium 3.4 (L) 3.5 - 5.1 mmol/L   Chloride 102 101 - 111 mmol/L   CO2 24 22 - 32 mmol/L   Glucose, Bld 191 (H) 65 - 99 mg/dL   BUN 11 6 - 20 mg/dL   Creatinine, Ser 0.91 0.61 - 1.24 mg/dL   Calcium 9.2 8.9 - 10.3 mg/dL   Total Protein 7.7 6.5 - 8.1 g/dL   Albumin 3.6 3.5 - 5.0 g/dL   AST 24 15 - 41 U/L   ALT 12 (L) 17 - 63 U/L   Alkaline Phosphatase 90 38 - 126 U/L   Total Bilirubin 0.7 0.3 - 1.2 mg/dL   GFR calc non Af Amer >60 >60 mL/min   GFR calc Af Amer >60 >60 mL/min    Comment: (NOTE) The eGFR has been calculated using the CKD EPI equation. This calculation has not been validated in all clinical situations. eGFR's persistently <60 mL/min signify possible Chronic Kidney Disease.    Anion gap 12 5 - 15    Comment: Performed at [facility], [address] [place], [place] [address]  Protime-INR     Status: Abnormal   Collection Time: 01/23/18 11:36 AM   Result Value Ref Range   Prothrombin Time 26.7 (H) 11.4 - 15.2 seconds   INR 2.49     Comment: Performed at [facility], [address] [place], [place] [address]  Culture, blood (single) w Reflex to ID Panel     Status: None   Collection Time: 01/23/18 11:36 AM  Result Value Ref Range   Specimen Description BLOOD RIGHT ANTECUBITAL    Special Requests      BOTTLES DRAWN AEROBIC AND ANAEROBIC Blood Culture adequate volume   Culture      NO GROWTH 5 DAYS Performed at [facility], [address] [place], [place] [address]    Report Status 01/28/2018 FINAL   Culture, blood (single) w Reflex to ID Panel     Status: None   Collection Time: 01/23/18 11:36 AM  Result Value Ref Range   Specimen Description BLOOD BLOOD RIGHT HAND    Special Requests      BOTTLES DRAWN AEROBIC AND ANAEROBIC Blood Culture adequate volume   Culture      NO GROWTH 5 DAYS Performed at [facility], [address], [place], [place] [address]    Report Status 01/28/2018 FINAL   Urinalysis, Complete     Status: Abnormal   Collection Time: 01/31/18  1:21 PM  Result Value Ref Range   Specific Gravity, UA >1.030 (H) 1.005 - 1.030   pH, UA 5.0 5.0 - 7.5   Color, UA Yellow Yellow   Appearance Ur Cloudy (  A) Clear   Leukocytes, UA Negative Negative   Protein, UA Trace (A) Negative/Trace   Glucose, UA Negative Negative   Ketones, UA Trace (A) Negative   RBC, UA Negative Negative   Bilirubin, UA Negative Negative   Urobilinogen, Ur 0.2 0.2 - 1.0 mg/dL   Nitrite, UA Negative Negative   Microscopic Examination See below:   Microscopic Examination     Status: Abnormal   Collection Time: 01/31/18  1:21 PM  Result Value Ref Range   WBC, UA None seen 0 - 5 /hpf   RBC, UA None seen 0 - 2 /hpf   Epithelial Cells (non renal) None seen 0 - 10 /hpf   Casts Present (A) None seen /lpf   Cast Type Hyaline casts N/A   Crystals Present (A) N/A   Crystal Type Calcium Oxalate N/A   Mucus,  UA Present (A) Not Estab.   Bacteria, UA Few (A) None seen/Few  Comprehensive metabolic panel     Status: Abnormal   Collection Time: 02/09/18  4:54 PM  Result Value Ref Range   Sodium 134 (L) 135 - 145 mmol/L   Potassium 4.0 3.5 - 5.1 mmol/L   Chloride 99 (L) 101 - 111 mmol/L   CO2 23 22 - 32 mmol/L   Glucose, Bld 141 (H) 65 - 99 mg/dL   BUN 17 6 - 20 mg/dL   Creatinine, Ser 0.86 0.61 - 1.24 mg/dL   Calcium 9.5 8.9 - 10.3 mg/dL   Total Protein 8.4 (H) 6.5 - 8.1 g/dL   Albumin 3.8 3.5 - 5.0 g/dL   AST 25 15 - 41 U/L   ALT 17 17 - 63 U/L   Alkaline Phosphatase 88 38 - 126 U/L   Total Bilirubin 0.7 0.3 - 1.2 mg/dL   GFR calc non Af Amer >60 >60 mL/min   GFR calc Af Amer >60 >60 mL/min    Comment: (NOTE) The eGFR has been calculated using the CKD EPI equation. This calculation has not been validated in all clinical situations. eGFR's persistently <60 mL/min signify possible Chronic Kidney Disease.    Anion gap 12 5 - 15    Comment: Performed at [facility], [address] [place], [place] [address]  Lactic acid, plasma     Status: None   Collection Time: 02/09/18  4:54 PM  Result Value Ref Range   Lactic Acid, Venous 1.7 0.5 - 1.9 mmol/L    Comment: Performed at [facility], [address] [place], [place] [address]  CBC with Differential     Status: Abnormal   Collection Time: 02/09/18  4:54 PM  Result Value Ref Range   WBC 9.9 3.8 - 10.6 K/uL   RBC 3.90 (L) 4.40 - 5.90 MIL/uL   Hemoglobin 12.9 (L) 13.0 - 18.0 g/dL   HCT 37.4 (L) 40.0 - 52.0 %   MCV 95.9 80.0 - 100.0 fL   MCH 32.9 26.0 - 34.0 pg   MCHC 34.4 32.0 - 36.0 g/dL   RDW 15.0 (H) 11.5 - 14.5 %   Platelets 283 150 - 440 K/uL   Neutrophils Relative % 64 %   Neutro Abs 6.4 1.4 - 6.5 K/uL   Lymphocytes Relative 15 %   Lymphs Abs 1.5 1.0 - 3.6 K/uL   Monocytes Relative 12 %   Monocytes Absolute 1.2 (H) 0.2 - 1.0 K/uL   Eosinophils Relative 8 %   Eosinophils Absolute 0.8 (H) 0 - 0.7 K/uL    Basophils Relative 1 %  Basophils Absolute 0.1 0 - 0.1 K/uL    Comment: Performed at [facility], [address] [place], [place] [address]  Protime-INR     Status: Abnormal   Collection Time: 02/09/18  4:54 PM  Result Value Ref Range   Prothrombin Time 22.3 (H) 11.4 - 15.2 seconds   INR 1.98     Comment: Performed at [facility], [address], [place], [place] [address]  Culture, blood (Routine x 2)     Status: None   Collection Time: 02/09/18  4:54 PM  Result Value Ref Range   Specimen Description BLOOD LEFT ANTECUBITAL    Special Requests      BOTTLES DRAWN AEROBIC AND ANAEROBIC Blood Culture results may not be optimal due to an excessive volume of blood received in culture bottles   Culture      NO GROWTH 5 DAYS Performed at [facility], [address] [place], [place] [address]    Report Status 02/14/2018 FINAL   TSH     Status: Abnormal   Collection Time: 02/09/18  4:54 PM  Result Value Ref Range   TSH 5.099 (H) 0.350 - 4.500 uIU/mL    Comment: Performed by a 3rd Generation assay with a functional sensitivity of <=0.01 uIU/mL. Performed at [facility], [address] [place], [place] [address]   Glucose, capillary     Status: Abnormal   Collection Time: 02/09/18  7:59 PM  Result Value Ref Range   Glucose-Capillary 155 (H) 65 - 99 mg/dL  Blood gas, arterial     Status: Abnormal   Collection Time: 02/09/18  9:17 PM  Result Value Ref Range   FIO2 28.00    pH, Arterial 7.45 7.350 - 7.450   pCO2 arterial 37 32.0 - 48.0 mmHg   pO2, Arterial 74 (L) 83.0 - 108.0 mmHg   Bicarbonate 25.7 20.0 - 28.0 mmol/L   Acid-Base Excess 1.8 0.0 - 2.0 mmol/L   O2 Saturation 95.4 %   Patient temperature 37.0    Sample type ARTERIAL DRAW    Allens test (pass/fail) PASS PASS    Comment: Performed at [facility], [address] [place], [place] [address]  Glucose, capillary     Status: Abnormal   Collection Time: 02/09/18 11:41  PM  Result Value Ref Range   Glucose-Capillary 240 (H) 65 - 99 mg/dL  Urinalysis, Complete w Microscopic     Status: Abnormal   Collection Time: 02/10/18  3:42 AM  Result Value Ref Range   Color, Urine YELLOW (A) YELLOW   APPearance CLEAR (A) CLEAR   Specific Gravity, Urine 1.013 1.005 - 1.030   pH 5.0 5.0 - 8.0   Glucose, UA NEGATIVE NEGATIVE mg/dL   Hgb urine dipstick NEGATIVE NEGATIVE   Bilirubin Urine NEGATIVE NEGATIVE   Ketones, ur NEGATIVE NEGATIVE mg/dL   Protein, ur NEGATIVE NEGATIVE mg/dL   Nitrite NEGATIVE NEGATIVE   Leukocytes, UA NEGATIVE NEGATIVE   RBC / HPF 0-5 0 - 5 RBC/hpf   WBC, UA 0-5 0 - 5 WBC/hpf   Bacteria, UA RARE (A) NONE SEEN   Squamous Epithelial / LPF NONE SEEN 0 - 5    Comment: Performed at [facility], [address] [place], [place] [address]  Glucose, capillary     Status: Abnormal   Collection Time: 02/10/18  8:01 AM  Result Value Ref Range   Glucose-Capillary 197 (H) 65 - 99 mg/dL  Protime-INR     Status: Abnormal  Collection Time: 02/10/18 11:17 AM  Result Value Ref Range   Prothrombin Time 19.8 (H) 11.4 - 15.2 seconds   INR 1.70     Comment: Performed at [facility], [address] [place], [place] [address]  Glucose, capillary     Status: Abnormal   Collection Time: 02/10/18 12:18 PM  Result Value Ref Range   Glucose-Capillary 251 (H) 65 - 99 mg/dL  Glucose, capillary     Status: Abnormal   Collection Time: 02/10/18  5:40 PM  Result Value Ref Range   Glucose-Capillary 177 (H) 65 - 99 mg/dL  Glucose, capillary     Status: Abnormal   Collection Time: 02/10/18  9:09 PM  Result Value Ref Range   Glucose-Capillary 163 (H) 65 - 99 mg/dL  Protime-INR     Status: Abnormal   Collection Time: 02/11/18  4:52 AM  Result Value Ref Range   Prothrombin Time 20.6 (H) 11.4 - 15.2 seconds   INR 1.79     Comment: Performed at [facility], [address] [place], [place] [address]  Glucose, capillary     Status:  Abnormal   Collection Time: 02/11/18  7:30 AM  Result Value Ref Range   Glucose-Capillary 214 (H) 65 - 99 mg/dL  Glucose, capillary     Status: Abnormal   Collection Time: 02/11/18 12:05 PM  Result Value Ref Range   Glucose-Capillary 200 (H) 65 - 99 mg/dL  Glucose, capillary     Status: Abnormal   Collection Time: 02/11/18  4:58 PM  Result Value Ref Range   Glucose-Capillary 227 (H) 65 - 99 mg/dL   Objective  Body mass index is 28.1 kg/m. Wt Readings from Last 3 Encounters:  02/16/18 184 lb 12.8 oz (83.8 kg)  02/10/18 187 lb 11.2 oz (85.1 kg)  01/31/18 188 lb (85.3 kg)   Temp Readings from Last 3 Encounters:  02/16/18 98.4 F (36.9 C) (Oral)  02/11/18 98.3 F (36.8 C) (Oral)  02/09/18 99.2 F (37.3 C) (Oral)   BP Readings from Last 3 Encounters:  02/16/18 140/62  02/11/18 131/60  02/09/18 (!) 142/66   Pulse Readings from Last 3 Encounters:  02/16/18 87  02/11/18 64  02/09/18 66    Physical Exam  Constitutional: He is oriented to person, place, and time. Vital signs are normal. He appears well-developed and well-nourished. He is cooperative.  HENT:  Head: Normocephalic and atraumatic.  Mouth/Throat: Oropharynx is clear and moist and mucous membranes are normal.  Eyes: Pupils are equal, round, and reactive to light. Conjunctivae are normal.  Cardiovascular: Normal rate, regular rhythm and normal heart sounds.  Pulmonary/Chest: Effort normal and breath sounds normal.  Neurological: He is alert and oriented to person, place, and time.  Using rolling walker today   Skin: Skin is warm, dry and intact.  Psychiatric: He has a normal mood and affect. His behavior is normal. Judgment and thought content normal. Cognition and memory are normal.  Nursing note and vitals reviewed.   Assessment   1. Cough, OSA, chronic granulomatous disease and lung nodules former smoker  2. CAD mild to moderate L>R 3. Presyncope, CAD, Afib s/p ablation subtherapeutic INR 4. Presyncope,  memory loss and confusion/AMS (see HPI)  5. Diarrhea improved  6. HM Plan   1.  appt [facility] pulm upcoming  2. Refer to [facility]  3. Refer to Dr. Riki Sheer cards try to move appt up  Pt wants to ask if can get off coumadin  Will try to get INR machine home use  Consider increasing coumadin to 4 mg M-F and 6 mg Sat/Sunday  Consider holter to w/u presyncope 4. Refer to [facility] neurology to w/u confusion test memory and presyncope  5. F/u GI cont meds  6.  Had flushot pna 23 had 02/2014 UTD prevnar Disc Tdap and shingrix in future   PSAhad nl 10/03/17 0.5 consider DRE in future Colonoscopy had 04/2017 [facility] GIh/o crohns on mesalamine 2.4 mg qd-negative colonoscopy with multiple bxs -see above re GI f/u  Consider repeat DEXA reviewed DEXA +osteoporosis noted 06/07/04 Ordered repeatCT chest h/o pulm nodules on CXR and CT chest10/11/16and former smoker pending 01/23/18  [place] dermatologist 2019tbse c/w left temple and right hand Aks. H/o skin cancer    Provider: Dr. Olivia Mackie McLean-Scocuzza-Internal Medicine

## 2018-02-17 DIAGNOSIS — Z471 Aftercare following joint replacement surgery: Secondary | ICD-10-CM | POA: Diagnosis not present

## 2018-02-17 DIAGNOSIS — N182 Chronic kidney disease, stage 2 (mild): Secondary | ICD-10-CM | POA: Diagnosis not present

## 2018-02-17 DIAGNOSIS — I480 Paroxysmal atrial fibrillation: Secondary | ICD-10-CM | POA: Diagnosis not present

## 2018-02-17 DIAGNOSIS — E1122 Type 2 diabetes mellitus with diabetic chronic kidney disease: Secondary | ICD-10-CM | POA: Diagnosis not present

## 2018-02-17 DIAGNOSIS — I129 Hypertensive chronic kidney disease with stage 1 through stage 4 chronic kidney disease, or unspecified chronic kidney disease: Secondary | ICD-10-CM | POA: Diagnosis not present

## 2018-02-17 DIAGNOSIS — E1140 Type 2 diabetes mellitus with diabetic neuropathy, unspecified: Secondary | ICD-10-CM | POA: Diagnosis not present

## 2018-02-18 ENCOUNTER — Emergency Department
Admission: EM | Admit: 2018-02-18 | Discharge: 2018-02-18 | Disposition: A | Discharge status: Home / Self Care | Payer: Medicare Other | Attending: Emergency Medicine | Admitting: Emergency Medicine

## 2018-02-18 ENCOUNTER — Encounter: Payer: Self-pay | Admitting: Emergency Medicine

## 2018-02-18 ENCOUNTER — Emergency Department: Payer: Medicare Other

## 2018-02-18 ENCOUNTER — Other Ambulatory Visit: Payer: Self-pay

## 2018-02-18 DIAGNOSIS — R1031 Right lower quadrant pain: Secondary | ICD-10-CM | POA: Diagnosis not present

## 2018-02-18 DIAGNOSIS — I2510 Atherosclerotic heart disease of native coronary artery without angina pectoris: Secondary | ICD-10-CM | POA: Diagnosis not present

## 2018-02-18 DIAGNOSIS — S3662XA Contusion of rectum, initial encounter: Secondary | ICD-10-CM | POA: Diagnosis not present

## 2018-02-18 DIAGNOSIS — Z7901 Long term (current) use of anticoagulants: Secondary | ICD-10-CM | POA: Diagnosis not present

## 2018-02-18 DIAGNOSIS — Z794 Long term (current) use of insulin: Secondary | ICD-10-CM | POA: Diagnosis not present

## 2018-02-18 DIAGNOSIS — E1140 Type 2 diabetes mellitus with diabetic neuropathy, unspecified: Secondary | ICD-10-CM | POA: Diagnosis not present

## 2018-02-18 DIAGNOSIS — I252 Old myocardial infarction: Secondary | ICD-10-CM | POA: Diagnosis not present

## 2018-02-18 DIAGNOSIS — Z85828 Personal history of other malignant neoplasm of skin: Secondary | ICD-10-CM | POA: Diagnosis not present

## 2018-02-18 DIAGNOSIS — M7981 Nontraumatic hematoma of soft tissue: Secondary | ICD-10-CM | POA: Diagnosis not present

## 2018-02-18 DIAGNOSIS — I110 Hypertensive heart disease with heart failure: Secondary | ICD-10-CM | POA: Insufficient documentation

## 2018-02-18 DIAGNOSIS — Z7902 Long term (current) use of antithrombotics/antiplatelets: Secondary | ICD-10-CM | POA: Insufficient documentation

## 2018-02-18 DIAGNOSIS — I509 Heart failure, unspecified: Secondary | ICD-10-CM | POA: Diagnosis not present

## 2018-02-18 DIAGNOSIS — Z87891 Personal history of nicotine dependence: Secondary | ICD-10-CM | POA: Insufficient documentation

## 2018-02-18 DIAGNOSIS — S301XXA Contusion of abdominal wall, initial encounter: Secondary | ICD-10-CM

## 2018-02-18 DIAGNOSIS — Z79899 Other long term (current) drug therapy: Secondary | ICD-10-CM | POA: Insufficient documentation

## 2018-02-18 DIAGNOSIS — R1032 Left lower quadrant pain: Secondary | ICD-10-CM | POA: Diagnosis present

## 2018-02-18 DIAGNOSIS — Z951 Presence of aortocoronary bypass graft: Secondary | ICD-10-CM | POA: Diagnosis not present

## 2018-02-18 DIAGNOSIS — Z96641 Presence of right artificial hip joint: Secondary | ICD-10-CM | POA: Diagnosis not present

## 2018-02-18 DIAGNOSIS — Z955 Presence of coronary angioplasty implant and graft: Secondary | ICD-10-CM | POA: Diagnosis not present

## 2018-02-18 LAB — URINALYSIS, COMPLETE (UACMP) WITH MICROSCOPIC
BACTERIA UA: NONE SEEN
Bilirubin Urine: NEGATIVE
HGB URINE DIPSTICK: NEGATIVE
Ketones, ur: NEGATIVE mg/dL
LEUKOCYTES UA: NEGATIVE
Nitrite: NEGATIVE
PROTEIN: NEGATIVE mg/dL
SQUAMOUS EPITHELIAL / LPF: NONE SEEN (ref 0–5)
Specific Gravity, Urine: 1.017 (ref 1.005–1.030)
pH: 5 (ref 5.0–8.0)

## 2018-02-18 LAB — COMPREHENSIVE METABOLIC PANEL
ALBUMIN: 3.7 g/dL (ref 3.5–5.0)
ALT: 17 U/L (ref 17–63)
AST: 26 U/L (ref 15–41)
Alkaline Phosphatase: 103 U/L (ref 38–126)
Anion gap: 14 (ref 5–15)
BUN: 14 mg/dL (ref 6–20)
CHLORIDE: 97 mmol/L — AB (ref 101–111)
CO2: 23 mmol/L (ref 22–32)
Calcium: 9.7 mg/dL (ref 8.9–10.3)
Creatinine, Ser: 0.95 mg/dL (ref 0.61–1.24)
GFR calc Af Amer: >60 mL/min (ref >60)
GFR calc non Af Amer: >60 mL/min (ref >60)
GLUCOSE: 307 mg/dL — AB (ref 65–99)
POTASSIUM: 4.7 mmol/L (ref 3.5–5.1)
SODIUM: 134 mmol/L — AB (ref 135–145)
Total Bilirubin: 0.5 mg/dL (ref 0.3–1.2)
Total Protein: 8.2 g/dL — ABNORMAL HIGH (ref 6.5–8.1)

## 2018-02-18 LAB — CBC
HEMATOCRIT: 36.1 % — AB (ref 40.0–52.0)
Hemoglobin: 12.2 g/dL — ABNORMAL LOW (ref 13.0–18.0)
MCH: 32.2 pg (ref 26.0–34.0)
MCHC: 33.8 g/dL (ref 32.0–36.0)
MCV: 95.2 fL (ref 80.0–100.0)
Platelets: 343 10*3/uL (ref 150–440)
RBC: 3.79 MIL/uL — ABNORMAL LOW (ref 4.40–5.90)
RDW: 15.1 % — AB (ref 11.5–14.5)
WBC: 12.1 10*3/uL — AB (ref 3.8–10.6)

## 2018-02-18 LAB — PROTIME-INR
INR: 2.25
Prothrombin Time: 24.7 seconds — ABNORMAL HIGH (ref 11.4–15.2)

## 2018-02-18 LAB — LIPASE, BLOOD: LIPASE: 20 U/L (ref 11–51)

## 2018-02-18 MED ORDER — SODIUM CHLORIDE 0.9 % IV BOLUS
500.0000 mL | Freq: Once | INTRAVENOUS | Status: AC
  Administered 2018-02-18: 500 mL via INTRAVENOUS

## 2018-02-18 MED ORDER — MORPHINE SULFATE (PF) 4 MG/ML IV SOLN
4.0000 mg | Freq: Once | INTRAVENOUS | Status: AC
  Administered 2018-02-18: 4 mg via INTRAVENOUS
  Filled 2018-02-18: qty 1

## 2018-02-18 MED ORDER — IOHEXOL 300 MG/ML  SOLN
100.0000 mL | Freq: Once | INTRAMUSCULAR | Status: AC | PRN
  Administered 2018-02-18: 100 mL via INTRAVENOUS

## 2018-02-18 MED ORDER — ONDANSETRON HCL 4 MG/2ML IJ SOLN
4.0000 mg | Freq: Once | INTRAMUSCULAR | Status: AC
  Administered 2018-02-18: 4 mg via INTRAVENOUS
  Filled 2018-02-18: qty 2

## 2018-02-18 NOTE — Discharge Instructions (Addendum)
Continue your previous dose of coumadin and follow up with your doctor for continued monitoring of your symptoms.

## 2018-02-18 NOTE — ED Triage Notes (Signed)
First Nurse Note:  C/O LLQ and Left flank pain since last night.  Patient is AAOx3.  Skin warm and dry. NAD

## 2018-02-18 NOTE — ED Triage Notes (Signed)
Lower L abdominal pain since yesterday.

## 2018-02-18 NOTE — ED Provider Notes (Signed)
[facility] Emergency Department Provider Note  ____________________________________________  Time seen: Approximately 7:52 PM  I have reviewed the triage vital signs and the nursing notes.   HISTORY  Chief Complaint Abdominal Pain    HPI Chase Cabrera is a 72 y.o. male who complains of gradual onset of left lower quadrant abdominal pain last night.  Constant, gradually worsening, radiating to the right lower quadrant.  Worse with movement, no alleviating factors, described as achy.   No fevers chills vomiting or diarrhea.  Normal appetite.     Past Medical History:  Diagnosis Date  . Arthritis   . Atrial fibrillation ([place])   . CAD (coronary artery disease)    6 STENTS  . CHF (congestive heart failure) ([place])   . Chicken pox   . Colon polyp   . Corneal dystrophy    bilateral  . Crohn's disease ([place])   . Diabetes ([place]) 2002  . Dysrhythmia    A-FIB AND PAF  . GERD (gastroesophageal reflux disease)   . Gout   . History of kidney stones   . History of shingles   . Hyperlipemia   . Hypertension   . Kidney stones   . Myocardial infarction ([place])    x4, last one in 2010  . Peripheral neuropathy   . Peripheral neuropathy   . Peripheral neuropathy   . PUD (peptic ulcer disease)   . Renal artery stenosis ([place])   . Renal artery stenosis ([place])   . Salzmann's nodular dystrophy 2012  . Sleep apnea   . Spinal stenosis      Patient Active Problem List   Diagnosis Date Noted  . Carotid artery stenosis 02/16/2018  . Altered mental status 02/09/2018  . Sinusitis 02/09/2018  . Hypokalemia 01/20/2018  . OSA (obstructive sleep apnea) 01/12/2018  . Osteoarthritis of right hip 12/13/2017  . Intraabdominal mass 11/23/2017  . Colonic mass 11/18/2017  . Crohn's disease ([place]) 10/20/2017  . Pulmonary nodule 10/07/2017  . History of skin cancer 10/07/2017  . Rib pain on right side 03/07/2017  . Upper respiratory tract infection 09/09/2016  .  Osteoarthritis 04/08/2016  . Spinal stenosis, lumbar 03/25/2016  . DM type 2 with diabetic peripheral neuropathy ([place]) 07/15/2014  . Gout 05/04/2013  . Long term current use of anticoagulant 07/29/2011  . Renal artery stenosis ([place]) 07/01/2011  . Atrial fibrillation ([place]) 06/30/2011  . CAD (coronary artery disease) 06/30/2011  . Hyperlipidemia 06/30/2011  . Essential hypertension 06/30/2011     Past Surgical History:  Procedure Laterality Date  . ABLATION  2012  . APPLICATION VERTERBRAL DEFECT PROSTHETIC  05/01/2013  . ARTHRODESIS ANTERIOR LUMBAR SPINE  05/01/2013  . BACK SURGERY     lumbar fusion  . CARDIAC CATHETERIZATION    . CARDIAC ELECTROPHYSIOLOGY STUDY AND ABLATION    . CARDIAC SURGERY    . CARDIOVERSION    . CHOLECYSTECTOMY    . COLONOSCOPY WITH PROPOFOL N/A 04/09/2016   Completed for chronic diarrhea.  Few scattered diverticuli.  No mucosal lesions.  Surgeon: Lollie Sails, MD;  Location: [facility] ENDOSCOPY;  Service: Endoscopy;  Laterality: N/A;  . CORNEAL EYE SURGERY Bilateral 07/28/11    09/22/2011  . CORONARY ANGIOPLASTY    . CORONARY ANGIOPLASTY WITH STENT PLACEMENT  03/28/2015   Distal 80% to normal Stent, Dilation Balloon  . CORONARY ARTERY BYPASS GRAFT     4 VESSELS  . EYE SURGERY     bilateral cataract  . foot and ankle repair Right 2005  .  FRACTURE SURGERY     ANKLE PLATE AND SCREWS  . GALLBLADER    . JOINT REPLACEMENT     left total hip 11/11/15  . JOINT REPLACEMENT     right total hip 12/2017 Dr. Rudene Christians   . KNEE ARTHROSCOPY Right   . LUMBAR SPINE FUSION ONE LEVEL  05/03/2013  . RENAL ARTERY STENT Right   . ROTATOR CUFF REPAIR Right 2006  . TONSILLECTOMY    . TOTAL HIP ARTHROPLASTY Left 11/11/2015   Procedure: TOTAL HIP ARTHROPLASTY ANTERIOR APPROACH;  Surgeon: Hessie Knows, MD;  Location: [facility] ORS;  Service: Orthopedics;  Laterality: Left;  . TOTAL HIP ARTHROPLASTY Right 12/13/2017   Procedure: TOTAL HIP ARTHROPLASTY ANTERIOR APPROACH;  Surgeon: Hessie Knows, MD;  Location: [facility] ORS;  Service: Orthopedics;  Laterality: Right;  . TRANSCATH PLACEMENT INTRAVASCULAR STENT LEG  03/2015     Prior to Admission medications   Medication Sig Start Date End Date Taking? Authorizing Provider  amLODipine (NORVASC) 10 MG tablet Take 1 tablet (10 mg total) by mouth daily. 02/15/18   McLean-Scocuzza, Nino Glow, MD  amoxicillin-clavulanate (AUGMENTIN) 875-125 MG tablet Take 1 tablet by mouth every 12 (twelve) hours for 10 days. 02/11/18 02/21/18  Gladstone Lighter, MD  carvedilol (COREG) 25 MG tablet Take 1 tablet (25 mg total) by mouth 2 (two) times daily with a meal. 10/06/17   McLean-Scocuzza, Nino Glow, MD  chlorpheniramine-HYDROcodone (TUSSIONEX) 10-8 MG/5ML SUER Take 5 mLs by mouth every 12 (twelve) hours. 02/11/18   Gladstone Lighter, MD  Cholecalciferol 50000 units TABS Take 1 tablet by mouth once a week. Patient taking differently: Take 50,000 Units by mouth every Saturday.  10/07/17   McLean-Scocuzza, Nino Glow, MD  cholestyramine light (PREVALITE) 4 g packet Take 4 g by mouth every morning.  01/31/18   [provider]  clopidogrel (PLAVIX) 75 MG tablet Take 75 mg by mouth daily.    [provider]  colchicine 0.6 MG tablet TAKE 1 TABLET BY MOUTH ONCE DAILY Patient taking differently: TAKE 0.6 MG BY MOUTH ONCE DAILY 07/12/17   Leone Haven, MD  diclofenac sodium (VOLTAREN) 1 % GEL Apply 2 g topically 4 (four) times daily as needed (for pain).     [provider]  dicyclomine (BENTYL) 10 MG capsule Take 10 mg by mouth 3 (three) times daily before meals.     [provider]  fluticasone (FLONASE) 50 MCG/ACT nasal spray Place 2 sprays into both nostrils daily. 02/12/18   Gladstone Lighter, MD  gabapentin (NEURONTIN) 300 MG capsule take 2 capsules (617m) by mouth three times a day 01/16/18   McLean-Scocuzza, TNino Glow MD  guaiFENesin (MUCINEX) 600 MG 12 hr tablet Take 1 tablet (600 mg total) by mouth 2 (two) times daily. 02/11/18    KGladstone Lighter MD  hydrALAZINE (APRESOLINE) 100 MG tablet TAKE 100 MG BY MOUTH THREE TIMES A DAY 12/29/17   McLean-Scocuzza, TNino Glow MD  insulin lispro (HUMALOG) 100 UNIT/ML KiwkPen Inject 14-32u under the skin three times daily before meals according to sliding scale 05/03/16   [provider]  ipratropium (ATROVENT) 0.06 % nasal spray Place 2 sprays into both nostrils 4 (four) times daily. 02/09/18   McLean-Scocuzza, TNino Glow MD  isosorbide mononitrate (IMDUR) 120 MG 24 hr tablet TAKE 1 TABLET BY MOUTH EVERY DAY 10/07/17   McLean-Scocuzza, TNino Glow MD  LEVEMIR FLEXTOUCH 100 UNIT/ML Pen Inject 80 Units into the skin daily at 10 pm. 01/19/18   McLean-Scocuzza, TNino Glow MD  levocetirizine (  XYZAL) 5 MG tablet Take 1 tablet (5 mg total) by mouth every evening. 02/09/18   McLean-Scocuzza, Nino Glow, MD  montelukast (SINGULAIR) 10 MG tablet Take 1 tablet (10 mg total) by mouth at bedtime. 02/09/18   McLean-Scocuzza, Nino Glow, MD  Multiple Vitamin (MULTI-VITAMINS) TABS Take 1 tablet by mouth daily. 04/15/09   [provider]  nitroGLYCERIN (NITROSTAT) 0.4 MG SL tablet Place 1 tablet (0.4 mg total) under the tongue every 5 (five) minutes x 3 doses as needed. For chest pain.  Call 911 if no relief after 3 tablets 10/03/17 11/02/21  McLean-Scocuzza, Nino Glow, MD  ofloxacin (OCUFLOX) 0.3 % ophthalmic solution Place 1 drop into the right eye 4 (four) times daily for 7 days. 02/11/18 02/18/18  Gladstone Lighter, MD  omeprazole (PRILOSEC) 40 MG capsule Take 40 mg by mouth 2 (two) times daily.     [provider]  pravastatin (PRAVACHOL) 40 MG tablet TAKE 1 TABLET BY MOUTH ONCE DAILY 10/07/17   McLean-Scocuzza, Nino Glow, MD  probenecid (BENEMID) 500 MG tablet Take 500 mg by mouth 2 (two) times daily.  01/09/15   [provider]  silodosin (RAPAFLO) 8 MG CAPS capsule Take 1 capsule (8 mg total) by mouth daily with breakfast. 01/31/18   Stoioff, Ronda Fairly, MD  vitamin C (ASCORBIC ACID) 500 MG  tablet Take 500 mg by mouth daily.     [provider]  Vitamin E 400 units TABS Take 400 Units by mouth 2 (two) times daily.    [provider]  warfarin (COUMADIN) 2 MG tablet Take 4 mg daily except sat, Sunday take 6 mg 02/16/18   McLean-Scocuzza, Nino Glow, MD  warfarin (COUMADIN) 4 MG tablet Take 1 tablet (4 mg total) by mouth daily at 6 PM. Except sat and Sunday take 6 mg total daily. 02/16/18   McLean-Scocuzza, Nino Glow, MD     Allergies Allopurinol; Atenolol; Atorvastatin; Ramipril; Rosuvastatin; Simvastatin; Valsartan; and Cardizem [diltiazem]   Family History  Problem Relation Age of Onset  . CVA Father   . Stroke Father   . Lung cancer Mother   . Arthritis Mother   . Stomach cancer Sister   . Colon cancer Sister   . Diabetes Brother     Social History Social History   Tobacco Use  . Smoking status: Former Smoker    Packs/day: 1.00    Years: 3.00    Pack years: 3.00    Types: Cigarettes    Last attempt to quit: 06/30/1962    Years since quitting: 55.6  . Smokeless tobacco: Former Systems developer    Types: Chew    Quit date: 12/05/1968  Substance Use Topics  . Alcohol use: No  . Drug use: No    Review of Systems  Constitutional:   No fever or chills.  ENT:   No sore throat. No rhinorrhea. Cardiovascular:   No chest pain or syncope. Respiratory:   No dyspnea or cough. Gastrointestinal:   Positive as above for abdominal pain without vomiting and diarrhea.  Musculoskeletal:   Negative for focal pain or swelling All other systems reviewed and are negative except as documented above in ROS and HPI.  ____________________________________________   PHYSICAL EXAM:  VITAL SIGNS: ED Triage Vitals  Enc Vitals Group     BP 02/18/18 1345 (!) 155/70     Pulse Rate 02/18/18 1345 70     Resp 02/18/18 1345 18     Temp 02/18/18 1345 98.5 F (36.9 C)  Temp Source 02/18/18 1345 Oral     SpO2 02/18/18 1345 94 %     Weight 02/18/18 1346 188 lb (85.3 kg)      Height 02/18/18 1346 5' 8"  (1.727 m)     Head Circumference --      Peak Flow --      Pain Score 02/18/18 1345 10     Pain Loc --      Pain Edu? --      Excl. in [place]? --     Vital signs reviewed, nursing assessments reviewed.   Constitutional:   Alert and oriented. Non-toxic appearance. Eyes:   Conjunctivae are normal. EOMI. PERRL. ENT      Head:   Normocephalic and atraumatic.      Nose:   No congestion/rhinnorhea.       Mouth/Throat:   MMM, no pharyngeal erythema. No peritonsillar mass.       Neck:   No meningismus. Full ROM. Hematological/Lymphatic/Immunilogical:   No cervical lymphadenopathy. Cardiovascular:   RRR. Symmetric bilateral radial and DP pulses.  No murmurs.  Respiratory:   Normal respiratory effort without tachypnea/retractions. Breath sounds are clear and equal bilaterally. No wheezes/rales/rhonchi. Gastrointestinal:   Soft with substantial left lower quadrant and suprapubic tenderness.. Non distended. There is no CVA tenderness.  No rebound, rigidity, or guarding. Genitourinary:   Normal Musculoskeletal:   Normal range of motion in all extremities. No joint effusions.  No lower extremity tenderness.  No edema. Neurologic:   Normal speech and language.  Motor grossly intact. No acute focal neurologic deficits are appreciated.  Skin:    Skin is warm, dry and intact. No rash noted.  No petechiae, purpura, or bullae.  ____________________________________________    LABS (pertinent positives/negatives) (all labs ordered are listed, but only abnormal results are displayed) Labs Reviewed  COMPREHENSIVE METABOLIC PANEL - Abnormal; Notable for the following components:      Result Value   Sodium 134 (*)    Chloride 97 (*)    Glucose, Bld 307 (*)    Total Protein 8.2 (*)    All other components within normal limits  CBC - Abnormal; Notable for the following components:   WBC 12.1 (*)    RBC 3.79 (*)    Hemoglobin 12.2 (*)    HCT 36.1 (*)    RDW 15.1 (*)    All  other components within normal limits  URINALYSIS, COMPLETE (UACMP) WITH MICROSCOPIC - Abnormal; Notable for the following components:   Color, Urine YELLOW (*)    APPearance CLEAR (*)    Glucose, UA >=500 (*)    All other components within normal limits  PROTIME-INR - Abnormal; Notable for the following components:   Prothrombin Time 24.7 (*)    All other components within normal limits  LIPASE, BLOOD   ____________________________________________   EKG    ____________________________________________    RADIOLOGY  Ct Abdomen Pelvis W Contrast  Result Date: 02/18/2018 CLINICAL DATA:  Left lower quadrant and flank pain beginning last night. EXAM: CT ABDOMEN AND PELVIS WITH CONTRAST TECHNIQUE: Multidetector CT imaging of the abdomen and pelvis was performed using the standard protocol following bolus administration of intravenous contrast. CONTRAST:  157m OMNIPAQUE IOHEXOL 300 MG/ML  SOLN COMPARISON:  11/16/2017 FINDINGS: Lower Chest: No acute findings. Hepatobiliary: No hepatic masses identified. Prior cholecystectomy. No evidence of biliary obstruction. Pancreas:  No mass or inflammatory changes. Spleen: Within normal limits in size and appearance. Adrenals/Urinary Tract: No masses identified. Small right lower pole renal cyst again  noted. No evidence of ureteral calculi or hydronephrosis. Stomach/Bowel: No evidence of obstruction, inflammatory process or abnormal fluid collections. Vascular/Lymphatic: No pathologically enlarged lymph nodes. No abdominal aortic aneurysm. Aortic atherosclerosis. Reproductive:  Obscured by artifact from bilateral hip prostheses. Other: A new moderate left rectus sheath hematoma is seen measuring 8.9 x 4.9 cm. No intraperitoneal or retroperitoneal hemorrhage identified. A partially calcified peritoneal soft tissue nodule is again seen in the left lower quadrant on image 57/2 which measures 2 cm is not significantly changed since previous study. A few other tiny  sub-cm soft tissue nodules in the central and left abdominal omental fat are also stable. No evidence ascites. Musculoskeletal:  No suspicious bone lesions identified. IMPRESSION: New moderate left rectus sheath hematoma measuring 8.9 x 4.9 cm. Stable small partially calcified peritoneal and omental soft tissue nodules. Continued followup by CT is recommended in 6 months. Electronically Signed   By: Earle Gell M.D.   On: 02/18/2018 19:36    ____________________________________________   PROCEDURES Procedures  ____________________________________________  DIFFERENTIAL DIAGNOSIS   Diverticulitis, hernia, bowel perforation, cystitis, kidney stone  CLINICAL IMPRESSION / ASSESSMENT AND PLAN / ED COURSE  Pertinent labs & imaging results that were available during my care of the patient were reviewed by me and considered in my medical decision making (see chart for details).    Patient presents with severe left lower quadrant abdominal pain with tenderness on exam.  Vital signs are unremarkable, but due to age and comorbidities, I will need to obtain a CT scan of the abdomen and pelvis to further evaluate.  Clinical Course as of Feb 19 2212  Sat Feb 18, 2018  1952 CT scan reveals a left rectus sheath hematoma.  He is on warfarin.  I will check his INR.  Vital signs and hemoglobin are stable.   [PS]    Clinical Course User Index [PS] Carrie Mew, MD     ----------------------------------------- 10:13 PM on 02/18/2018 -----------------------------------------  Case discussed with Dr. Tollie Pizza of surgery, advised that there is no specific treatment for this and should be self-limited.  Advised patient to follow closely with his doctor to weigh risk benefit of continuing Coumadin at this time versus a short hold.  He does report that his doctor recently told him to increase the dose and I  advised not doing that for now and continue his previous dose for now especially since his INR is  already 2.2.  Would not reverse at this time.  Hemodynamically stable, hemoglobin at baseline.  ____________________________________________   FINAL CLINICAL IMPRESSION(S) / ED DIAGNOSES    Final diagnoses:  Rectus sheath hematoma, initial encounter  Anticoagulated on Coumadin     ED Discharge Orders    None      Portions of this note were generated with dragon dictation software. Dictation errors may occur despite best attempts at proofreading.    Carrie Mew, MD 02/18/18 2214

## 2018-02-19 ENCOUNTER — Other Ambulatory Visit: Payer: Self-pay

## 2018-02-19 ENCOUNTER — Encounter: Payer: Self-pay | Admitting: Emergency Medicine

## 2018-02-19 ENCOUNTER — Emergency Department: Payer: Medicare Other

## 2018-02-19 ENCOUNTER — Emergency Department
Admission: EM | Admit: 2018-02-19 | Discharge: 2018-02-20 | Disposition: A | Discharge status: Home / Self Care | Payer: Medicare Other | Attending: Emergency Medicine | Admitting: Emergency Medicine

## 2018-02-19 DIAGNOSIS — I110 Hypertensive heart disease with heart failure: Secondary | ICD-10-CM | POA: Diagnosis not present

## 2018-02-19 DIAGNOSIS — Z955 Presence of coronary angioplasty implant and graft: Secondary | ICD-10-CM | POA: Diagnosis not present

## 2018-02-19 DIAGNOSIS — Z794 Long term (current) use of insulin: Secondary | ICD-10-CM | POA: Insufficient documentation

## 2018-02-19 DIAGNOSIS — R1030 Lower abdominal pain, unspecified: Secondary | ICD-10-CM | POA: Diagnosis not present

## 2018-02-19 DIAGNOSIS — Z96643 Presence of artificial hip joint, bilateral: Secondary | ICD-10-CM | POA: Diagnosis not present

## 2018-02-19 DIAGNOSIS — Z7901 Long term (current) use of anticoagulants: Secondary | ICD-10-CM | POA: Diagnosis not present

## 2018-02-19 DIAGNOSIS — I509 Heart failure, unspecified: Secondary | ICD-10-CM | POA: Insufficient documentation

## 2018-02-19 DIAGNOSIS — I2510 Atherosclerotic heart disease of native coronary artery without angina pectoris: Secondary | ICD-10-CM | POA: Insufficient documentation

## 2018-02-19 DIAGNOSIS — Z79899 Other long term (current) drug therapy: Secondary | ICD-10-CM | POA: Diagnosis not present

## 2018-02-19 DIAGNOSIS — S3662XA Contusion of rectum, initial encounter: Secondary | ICD-10-CM | POA: Diagnosis not present

## 2018-02-19 DIAGNOSIS — E1140 Type 2 diabetes mellitus with diabetic neuropathy, unspecified: Secondary | ICD-10-CM | POA: Diagnosis not present

## 2018-02-19 DIAGNOSIS — Z87891 Personal history of nicotine dependence: Secondary | ICD-10-CM | POA: Diagnosis not present

## 2018-02-19 DIAGNOSIS — R109 Unspecified abdominal pain: Secondary | ICD-10-CM

## 2018-02-19 DIAGNOSIS — R140 Abdominal distension (gaseous): Secondary | ICD-10-CM | POA: Diagnosis not present

## 2018-02-19 LAB — COMPREHENSIVE METABOLIC PANEL
ALBUMIN: 3.6 g/dL (ref 3.5–5.0)
ALT: 15 U/L — AB (ref 17–63)
AST: 28 U/L (ref 15–41)
Alkaline Phosphatase: 100 U/L (ref 38–126)
Anion gap: 13 (ref 5–15)
BUN: 19 mg/dL (ref 6–20)
CHLORIDE: 97 mmol/L — AB (ref 101–111)
CO2: 23 mmol/L (ref 22–32)
CREATININE: 0.83 mg/dL (ref 0.61–1.24)
Calcium: 9 mg/dL (ref 8.9–10.3)
GFR calc Af Amer: >60 mL/min (ref >60)
Glucose, Bld: 174 mg/dL — ABNORMAL HIGH (ref 65–99)
POTASSIUM: 4.1 mmol/L (ref 3.5–5.1)
Sodium: 133 mmol/L — ABNORMAL LOW (ref 135–145)
Total Bilirubin: 0.5 mg/dL (ref 0.3–1.2)
Total Protein: 8 g/dL (ref 6.5–8.1)

## 2018-02-19 LAB — CBC
HEMATOCRIT: 34.9 % — AB (ref 40.0–52.0)
Hemoglobin: 12.2 g/dL — ABNORMAL LOW (ref 13.0–18.0)
MCH: 32.6 pg (ref 26.0–34.0)
MCHC: 35 g/dL (ref 32.0–36.0)
MCV: 93.3 fL (ref 80.0–100.0)
PLATELETS: 311 10*3/uL (ref 150–440)
RBC: 3.75 MIL/uL — ABNORMAL LOW (ref 4.40–5.90)
RDW: 14.9 % — AB (ref 11.5–14.5)
WBC: 11.7 10*3/uL — AB (ref 3.8–10.6)

## 2018-02-19 LAB — PROTIME-INR
INR: 2.21
Prothrombin Time: 24.3 seconds — ABNORMAL HIGH (ref 11.4–15.2)

## 2018-02-19 MED ORDER — ONDANSETRON HCL 4 MG/2ML IJ SOLN
4.0000 mg | Freq: Once | INTRAMUSCULAR | Status: AC
  Administered 2018-02-19: 4 mg via INTRAVENOUS
  Filled 2018-02-19: qty 2

## 2018-02-19 MED ORDER — IOHEXOL 300 MG/ML  SOLN
100.0000 mL | Freq: Once | INTRAMUSCULAR | Status: AC | PRN
  Administered 2018-02-19: 100 mL via INTRAVENOUS
  Filled 2018-02-19: qty 100

## 2018-02-19 MED ORDER — MORPHINE SULFATE (PF) 4 MG/ML IV SOLN
4.0000 mg | Freq: Once | INTRAVENOUS | Status: AC
Start: 2018-02-19 — End: 2018-02-19
  Administered 2018-02-19: 4 mg via INTRAVENOUS
  Filled 2018-02-19: qty 1

## 2018-02-19 NOTE — ED Provider Notes (Addendum)
[facility] Emergency Department Provider Note   ____________________________________________   First MD Initiated Contact with Patient 02/19/18 2139     (approximate)  I have reviewed the triage vital signs and the nursing notes.   HISTORY  Chief Complaint Abdominal Pain    HPI Chase Cabrera is a 72 y.o. male Who comes in complaining of increasing abdominal pain and increasing size of his lower abdomen where he had a rectus sheath hematoma diagnosed yesterday. He is taking Coumadin which he and his wife understands it is for atrial fibrillation which was converted to normal sinus rhythm. I discussed his case with the radiologist and she recommends CT with contrast to evaluate for any active bleeding. This is even knowing he had a CT yesterday.    Past Medical History:  Diagnosis Date  . Arthritis   . Atrial fibrillation ([place])   . CAD (coronary artery disease)    6 STENTS  . CHF (congestive heart failure) ([place])   . Chicken pox   . Colon polyp   . Corneal dystrophy    bilateral  . Crohn's disease ([place])   . Diabetes ([place]) 2002  . Dysrhythmia    A-FIB AND PAF  . GERD (gastroesophageal reflux disease)   . Gout   . History of kidney stones   . History of shingles   . Hyperlipemia   . Hypertension   . Kidney stones   . Myocardial infarction ([place])    x4, last one in 2010  . Peripheral neuropathy   . Peripheral neuropathy   . Peripheral neuropathy   . PUD (peptic ulcer disease)   . Renal artery stenosis ([place])   . Renal artery stenosis ([place])   . Salzmann's nodular dystrophy 2012  . Sleep apnea   . Spinal stenosis     Patient Active Problem List   Diagnosis Date Noted  . Carotid artery stenosis 02/16/2018  . Altered mental status 02/09/2018  . Sinusitis 02/09/2018  . Hypokalemia 01/20/2018  . OSA (obstructive sleep apnea) 01/12/2018  . Osteoarthritis of right hip 12/13/2017  . Intraabdominal mass 11/23/2017  . Colonic mass  11/18/2017  . Crohn's disease ([place]) 10/20/2017  . Pulmonary nodule 10/07/2017  . History of skin cancer 10/07/2017  . Rib pain on right side 03/07/2017  . Upper respiratory tract infection 09/09/2016  . Osteoarthritis 04/08/2016  . Spinal stenosis, lumbar 03/25/2016  . DM type 2 with diabetic peripheral neuropathy ([place]) 07/15/2014  . Gout 05/04/2013  . Long term current use of anticoagulant 07/29/2011  . Renal artery stenosis ([place]) 07/01/2011  . Atrial fibrillation ([place]) 06/30/2011  . CAD (coronary artery disease) 06/30/2011  . Hyperlipidemia 06/30/2011  . Essential hypertension 06/30/2011    Past Surgical History:  Procedure Laterality Date  . ABLATION  2012  . APPLICATION VERTERBRAL DEFECT PROSTHETIC  05/01/2013  . ARTHRODESIS ANTERIOR LUMBAR SPINE  05/01/2013  . BACK SURGERY     lumbar fusion  . CARDIAC CATHETERIZATION    . CARDIAC ELECTROPHYSIOLOGY STUDY AND ABLATION    . CARDIAC SURGERY    . CARDIOVERSION    . CHOLECYSTECTOMY    . COLONOSCOPY WITH PROPOFOL N/A 04/09/2016   Completed for chronic diarrhea.  Few scattered diverticuli.  No mucosal lesions.  Surgeon: Lollie Sails, MD;  Location: [facility] ENDOSCOPY;  Service: Endoscopy;  Laterality: N/A;  . CORNEAL EYE SURGERY Bilateral 07/28/11    09/22/2011  . CORONARY ANGIOPLASTY    . CORONARY ANGIOPLASTY WITH STENT PLACEMENT  03/28/2015  Distal 80% to normal Stent, Dilation Balloon  . CORONARY ARTERY BYPASS GRAFT     4 VESSELS  . EYE SURGERY     bilateral cataract  . foot and ankle repair Right 2005  . FRACTURE SURGERY     ANKLE PLATE AND SCREWS  . GALLBLADER    . JOINT REPLACEMENT     left total hip 11/11/15  . JOINT REPLACEMENT     right total hip 12/2017 Dr. Rudene Christians   . KNEE ARTHROSCOPY Right   . LUMBAR SPINE FUSION ONE LEVEL  05/03/2013  . RENAL ARTERY STENT Right   . ROTATOR CUFF REPAIR Right 2006  . TONSILLECTOMY    . TOTAL HIP ARTHROPLASTY Left 11/11/2015   Procedure: TOTAL HIP ARTHROPLASTY ANTERIOR APPROACH;   Surgeon: Hessie Knows, MD;  Location: [facility] ORS;  Service: Orthopedics;  Laterality: Left;  . TOTAL HIP ARTHROPLASTY Right 12/13/2017   Procedure: TOTAL HIP ARTHROPLASTY ANTERIOR APPROACH;  Surgeon: Hessie Knows, MD;  Location: [facility] ORS;  Service: Orthopedics;  Laterality: Right;  . TRANSCATH PLACEMENT INTRAVASCULAR STENT LEG  03/2015    Prior to Admission medications   Medication Sig Start Date End Date Taking? Authorizing Provider  amLODipine (NORVASC) 10 MG tablet Take 1 tablet (10 mg total) by mouth daily. 02/15/18   McLean-Scocuzza, Nino Glow, MD  amoxicillin-clavulanate (AUGMENTIN) 875-125 MG tablet Take 1 tablet by mouth every 12 (twelve) hours for 10 days. 02/11/18 02/21/18  Gladstone Lighter, MD  carvedilol (COREG) 25 MG tablet Take 1 tablet (25 mg total) by mouth 2 (two) times daily with a meal. 10/06/17   McLean-Scocuzza, Nino Glow, MD  chlorpheniramine-HYDROcodone (TUSSIONEX) 10-8 MG/5ML SUER Take 5 mLs by mouth every 12 (twelve) hours. 02/11/18   Gladstone Lighter, MD  Cholecalciferol 50000 units TABS Take 1 tablet by mouth once a week. Patient taking differently: Take 50,000 Units by mouth every Saturday.  10/07/17   McLean-Scocuzza, Nino Glow, MD  cholestyramine light (PREVALITE) 4 g packet Take 4 g by mouth every morning.  01/31/18   [provider]  clopidogrel (PLAVIX) 75 MG tablet Take 75 mg by mouth daily.    [provider]  colchicine 0.6 MG tablet TAKE 1 TABLET BY MOUTH ONCE DAILY Patient taking differently: TAKE 0.6 MG BY MOUTH ONCE DAILY 07/12/17   Leone Haven, MD  diclofenac sodium (VOLTAREN) 1 % GEL Apply 2 g topically 4 (four) times daily as needed (for pain).     [provider]  dicyclomine (BENTYL) 10 MG capsule Take 10 mg by mouth 3 (three) times daily before meals.     [provider]  fluticasone (FLONASE) 50 MCG/ACT nasal spray Place 2 sprays into both nostrils daily. 02/12/18   Gladstone Lighter, MD  gabapentin (NEURONTIN) 300 MG  capsule take 2 capsules (663m) by mouth three times a day 01/16/18   McLean-Scocuzza, TNino Glow MD  guaiFENesin (MUCINEX) 600 MG 12 hr tablet Take 1 tablet (600 mg total) by mouth 2 (two) times daily. 02/11/18   KGladstone Lighter MD  hydrALAZINE (APRESOLINE) 100 MG tablet TAKE 100 MG BY MOUTH THREE TIMES A DAY 12/29/17   McLean-Scocuzza, TNino Glow MD  HYDROcodone-acetaminophen (NORCO) 5-325 MG tablet Take 1 tablet by mouth every 4 (four) hours as needed for severe pain (try not to take more than 2 a day.). 02/20/18   MNena Polio MD  insulin lispro (HUMALOG) 100 UNIT/ML KiwkPen Inject 14-32u under the skin three times daily before meals according to sliding scale 05/03/16   [provider]  ipratropium (ATROVENT) 0.06 % nasal spray Place 2 sprays into both nostrils 4 (four) times daily. 02/09/18   McLean-Scocuzza, Nino Glow, MD  isosorbide mononitrate (IMDUR) 120 MG 24 hr tablet TAKE 1 TABLET BY MOUTH EVERY DAY 10/07/17   McLean-Scocuzza, Nino Glow, MD  LEVEMIR FLEXTOUCH 100 UNIT/ML Pen Inject 80 Units into the skin daily at 10 pm. 01/19/18   McLean-Scocuzza, Nino Glow, MD  levocetirizine (XYZAL) 5 MG tablet Take 1 tablet (5 mg total) by mouth every evening. 02/09/18   McLean-Scocuzza, Nino Glow, MD  montelukast (SINGULAIR) 10 MG tablet Take 1 tablet (10 mg total) by mouth at bedtime. 02/09/18   McLean-Scocuzza, Nino Glow, MD  Multiple Vitamin (MULTI-VITAMINS) TABS Take 1 tablet by mouth daily. 04/15/09   [provider]  nitroGLYCERIN (NITROSTAT) 0.4 MG SL tablet Place 1 tablet (0.4 mg total) under the tongue every 5 (five) minutes x 3 doses as needed. For chest pain.  Call 911 if no relief after 3 tablets 10/03/17 11/02/21  McLean-Scocuzza, Nino Glow, MD  omeprazole (PRILOSEC) 40 MG capsule Take 40 mg by mouth 2 (two) times daily.     [provider]  pravastatin (PRAVACHOL) 40 MG tablet TAKE 1 TABLET BY MOUTH ONCE DAILY 10/07/17   McLean-Scocuzza, Nino Glow, MD  probenecid (BENEMID) 500 MG tablet  Take 500 mg by mouth 2 (two) times daily.  01/09/15   [provider]  silodosin (RAPAFLO) 8 MG CAPS capsule Take 1 capsule (8 mg total) by mouth daily with breakfast. 01/31/18   Stoioff, Ronda Fairly, MD  vitamin C (ASCORBIC ACID) 500 MG tablet Take 500 mg by mouth daily.     [provider]  Vitamin E 400 units TABS Take 400 Units by mouth 2 (two) times daily.    [provider]  warfarin (COUMADIN) 2 MG tablet Take 4 mg daily except sat, Sunday take 6 mg 02/16/18   McLean-Scocuzza, Nino Glow, MD  warfarin (COUMADIN) 4 MG tablet Take 1 tablet (4 mg total) by mouth daily at 6 PM. Except sat and Sunday take 6 mg total daily. 02/16/18   McLean-Scocuzza, Nino Glow, MD    Allergies Allopurinol; Atenolol; Atorvastatin; Ramipril; Rosuvastatin; Simvastatin; Valsartan; and Cardizem [diltiazem]  Family History  Problem Relation Age of Onset  . CVA Father   . Stroke Father   . Lung cancer Mother   . Arthritis Mother   . Stomach cancer Sister   . Colon cancer Sister   . Diabetes Brother     Social History Social History   Tobacco Use  . Smoking status: Former Smoker    Packs/day: 1.00    Years: 3.00    Pack years: 3.00    Types: Cigarettes    Last attempt to quit: 06/30/1962    Years since quitting: 55.6  . Smokeless tobacco: Former Systems developer    Types: Chew    Quit date: 12/05/1968  Substance Use Topics  . Alcohol use: No  . Drug use: No    Review of Systems  Constitutional: No fever/chills Eyes: No visual changes. ENT: No sore throat. Cardiovascular: Denies chest pain. Respiratory: Denies shortness of breath. Gastrointestinal:  abdominal pain.  No nausea, no vomiting.  No diarrhea.  No constipation. Genitourinary: Negative for dysuria. Musculoskeletal: Negative for back pain. Skin: Negative for rash. Neurological: Negative for headaches, focal weakness  ____________________________________________   PHYSICAL EXAM:  VITAL SIGNS: ED Triage Vitals [02/19/18 2119]   Enc Vitals Group     BP (!) 155/57  Pulse Rate 69     Resp 18     Temp 99.1 F (37.3 C)     Temp Source Oral     SpO2 93 %     Weight 188 lb (85.3 kg)     Height 5' 8"  (1.727 m)     Head Circumference      Peak Flow      Pain Score 6     Pain Loc      Pain Edu?      Excl. in [place]?     Constitutional: Alert and oriented. Well appearing and in no acute distress. Eyes: Conjunctivae are normal. Head: Atraumatic. Nose: No congestion/rhinnorhea. Mouth/Throat: Mucous membranes are moist.  Oropharynx non-erythematous. Neck: No stridor.   Cardiovascular: Normal rate, regular rhythm. Grossly normal heart sounds.  Good peripheral circulation. Respiratory: Normal respiratory effort.  No retractions. Lungs CTAB. Gastrointestinal: Soft tender across lower abdomen where the skin is faintly blue and always using as though the hematoma has gotten bigger. No distention. No abdominal bruits. No CVA tenderness. Musculoskeletal: No lower extremity tenderness  Neurologic:  Normal speech and language. No gross focal neurologic deficits are appreciatedty. Skin:  Skin is warm, dry and intact. No rash noted. Psychiatric: Mood and affect are normal. Speech and behavior are normal.  ____________________________________________   LABS (all labs ordered are listed, but only abnormal results are displayed)  Labs Reviewed  COMPREHENSIVE METABOLIC PANEL - Abnormal; Notable for the following components:      Result Value   Sodium 133 (*)    Chloride 97 (*)    Glucose, Bld 174 (*)    ALT 15 (*)    All other components within normal limits  CBC - Abnormal; Notable for the following components:   WBC 11.7 (*)    RBC 3.75 (*)    Hemoglobin 12.2 (*)    HCT 34.9 (*)    RDW 14.9 (*)    All other components within normal limits  PROTIME-INR - Abnormal; Notable for the following components:   Prothrombin Time 24.3 (*)    All other components within normal limits  URINALYSIS, COMPLETE (UACMP) WITH  MICROSCOPIC   ____________________________________________  EKG EKG read and interpreted by me shows normal sinus rhythm rate of 67 left axis no acute ST-T wave changes  ____________________________________________  RADIOLOGY  ED MD interpretation: CT read by radiology shows that the rectus sheath hematoma has not enlarged. I reviewed the films  Official radiology report(s): Ct Abdomen Pelvis W Contrast  Result Date: 02/19/2018 CLINICAL DATA:  Abdominal distension, left rectus sheath hematoma on anticoagulation. EXAM: CT ABDOMEN AND PELVIS WITH CONTRAST TECHNIQUE: Multidetector CT imaging of the abdomen and pelvis was performed using the standard protocol following bolus administration of intravenous contrast. CONTRAST:  126m OMNIPAQUE IOHEXOL 300 MG/ML  SOLN COMPARISON:  CT yesterday. FINDINGS: Lower chest: Calcified granuloma in the left lower lobe. No pleural fluid or focal consolidation. Hepatobiliary: No focal hepatic lesion. Calcified hepatic granuloma. Decreased hepatic density suggesting steatosis. Clips in the gallbladder fossa postcholecystectomy. No biliary dilatation. Pancreas: No ductal dilatation or inflammation. Spleen: Normal in size.  Calcified splenic granuloma. Adrenals/Urinary Tract: Normal adrenal glands. No hydronephrosis or perinephric edema. Homogeneous renal enhancement with symmetric excretion on delayed phase imaging. Small cysts in the lower right kidney and exophytic subcentimeter lesion from the upper right kidney, too small to characterize but unchanged. Urinary bladder is physiologically distended without wall thickening. Streak artifact from bilateral hip arthroplasties partially obscures bladder evaluation. Stomach/Bowel: Sigmoid colonic  tortuosity and moderate colonic stool burden. No bowel wall thickening, inflammatory change or obstruction. Appendix tentatively identified and normal. Vascular/Lymphatic: Aortic atherosclerosis without aneurysm. No enlarged lymph  nodes. Reproductive: Prostate gland obscured by streak artifact from bilateral hip arthroplasties. Other: Left rectus sheath hematoma is slightly smaller from prior exam allowing for similar caliper placement measuring 7.9 x 4.5 cm previously 8.9 x 4.9 cm. No active extravasation. Minimal extraperitoneal hemorrhage tracks along the left iliopsoas muscle. No intraperitoneal extravasation. Soft tissue edema in the lower anterior abdominal wall similar to prior exam and may be secondary to medication injection sites. Partially calcified 1.9 cm left lower quadrant peritoneal nodule image 56 series 2, unchanged from prior exam. Small omental nodules in the anterior upper abdomen image 26 series 2 and left abdomen image 30 series 2 are not significantly changed. No free air or ascites. Musculoskeletal: Bilateral hip arthroplasties. Anterior and posterior lumbar fusion of the lower lumbar spine. No acute osseous abnormality. IMPRESSION: 1. Left rectus sheath hematoma has not progressed, and is slightly smaller than on CT yesterday. No evidence of active extravasation. 2. No new abnormality in the abdomen/pelvis. Peritoneal nodules, partially calcified as before. Continued follow-up by CT as per prior recommendations in 6 months. Electronically Signed   By: Jeb Levering M.D.   On: 02/19/2018 23:58    ____________________________________________   PROCEDURES  Procedure(s) performed:  Procedures  Critical Care performe  ____________________________________________   INITIAL IMPRESSION / ASSESSMENT AND PLAN / ED COURSE  CT shows the rectus sheathhematoma is slightly smaller. I discussed the patient with hospitalist on-call. We will try to let him go with some pain medications. He will follow-up with primary care. His hemoglobin and hematocrit are stable his INR isalmost therapeutic. I will not change anything at the present time. He will come back if he has any further problems.   Paitent and wife told  about need for follow up CT of abd in 6 months.   ____________________________________________   FINAL CLINICAL IMPRESSION(S) / ED DIAGNOSES  Final diagnoses:  Abdominal pain, unspecified abdominal location     ED Discharge Orders        Ordered    HYDROcodone-acetaminophen (NORCO) 5-325 MG tablet  Every 4 hours PRN     02/20/18 0021       Note:  This document was prepared using Dragon voice recognition software and may include unintentional dictation errors.    Nena Polio, MD 02/20/18 [place]    Nena Polio, MD 02/20/18 [id number]    Nena Polio, MD 02/20/18 [id number]

## 2018-02-19 NOTE — ED Notes (Signed)
Pt reports LLQ pain and bruising. Pt was seen here yesterday for the same. Pt is on Coumadin and gives subq injections of insulin into his abdomen. Pt states "I'm back because I'm just worried about my stomach because I'm on bloodthinners." Pt denies any falls or injury to abdomen. Denies any N/V/D or urinary symptoms. Small bruise with tenderness over left lower abdomen, soft upon palpation. Denies CP/SOB or blood in stool.

## 2018-02-19 NOTE — ED Triage Notes (Addendum)
Patient with complaint of left lower abdominal pain with a "knot". Patient started hurting 2 nights ago. Patient was seen here last night and told that he has a hematoma to left lower quad. Patient states that he was concerned since he takes Warfarin.

## 2018-02-20 ENCOUNTER — Institutional Professional Consult (permissible substitution): Payer: Medicare Other | Admitting: Pulmonary Disease

## 2018-02-20 DIAGNOSIS — R1030 Lower abdominal pain, unspecified: Secondary | ICD-10-CM | POA: Diagnosis not present

## 2018-02-20 LAB — URINALYSIS, COMPLETE (UACMP) WITH MICROSCOPIC
Bacteria, UA: NONE SEEN
Bilirubin Urine: NEGATIVE
Glucose, UA: NEGATIVE mg/dL
HGB URINE DIPSTICK: NEGATIVE
Ketones, ur: NEGATIVE mg/dL
LEUKOCYTES UA: NEGATIVE
NITRITE: NEGATIVE
PH: 5 (ref 5.0–8.0)
Protein, ur: NEGATIVE mg/dL
SPECIFIC GRAVITY, URINE: 1.028 (ref 1.005–1.030)
Squamous Epithelial / LPF: NONE SEEN (ref 0–5)

## 2018-02-20 LAB — GLUCOSE, CAPILLARY: Glucose-Capillary: 189 mg/dL — ABNORMAL HIGH (ref 65–99)

## 2018-02-20 MED ORDER — HYDROCODONE-ACETAMINOPHEN 5-325 MG PO TABS: 1.0000 | ORAL_TABLET | ORAL | 0 refills | Status: DC | PRN

## 2018-02-20 MED ORDER — HYDROCODONE-ACETAMINOPHEN 5-325 MG PO TABS
1.0000 | ORAL_TABLET | Freq: Four times a day (QID) | ORAL | Status: DC | PRN
  Administered 2018-02-20: 1 via ORAL
  Filled 2018-02-20: qty 1

## 2018-02-20 NOTE — ED Notes (Signed)
Pt to toilet with walker; 1 person assist

## 2018-02-20 NOTE — Progress Notes (Deleted)
Synopsis: Referred in *** for ***  Subjective:   PATIENT ID: Chase Cabrera GENDER: male DOB: [birth year], MRN: [id number]   HPI  No chief complaint on file.   ***  Past Medical History:  Diagnosis Date  . Arthritis   . Atrial fibrillation ([place])   . CAD (coronary artery disease)    6 STENTS  . CHF (congestive heart failure) ([place])   . Chicken pox   . Colon polyp   . Corneal dystrophy    bilateral  . Crohn's disease ([place])   . Diabetes ([place]) 2002  . Dysrhythmia    A-FIB AND PAF  . GERD (gastroesophageal reflux disease)   . Gout   . History of kidney stones   . History of shingles   . Hyperlipemia   . Hypertension   . Kidney stones   . Myocardial infarction ([place])    x4, last one in 2010  . Peripheral neuropathy   . Peripheral neuropathy   . Peripheral neuropathy   . PUD (peptic ulcer disease)   . Renal artery stenosis ([place])   . Renal artery stenosis ([place])   . Salzmann's nodular dystrophy 2012  . Sleep apnea   . Spinal stenosis      Family History  Problem Relation Age of Onset  . CVA Father   . Stroke Father   . Lung cancer Mother   . Arthritis Mother   . Stomach cancer Sister   . Colon cancer Sister   . Diabetes Brother      Social History   Socioeconomic History  . Marital status: Married    Spouse name: Not on file  . Number of children: Not on file  . Years of education: Not on file  . Highest education level: Not on file  Occupational History  . Not on file  Social Needs  . Financial resource strain: Not on file  . Food insecurity:    Worry: Not on file    Inability: Not on file  . Transportation needs:    Medical: Not on file    Non-medical: Not on file  Tobacco Use  . Smoking status: Former Smoker    Packs/day: 1.00    Years: 3.00    Pack years: 3.00    Types: Cigarettes    Last attempt to quit: 06/30/1962    Years since quitting: 55.6  . Smokeless tobacco: Former Systems developer    Types: [place] date: 12/05/1968  Substance and  Sexual Activity  . Alcohol use: No  . Drug use: No  . Sexual activity: Not on file  Lifestyle  . Physical activity:    Days per week: Not on file    Minutes per session: Not on file  . Stress: Not on file  Relationships  . Social connections:    Talks on phone: Not on file    Gets together: Not on file    Attends religious service: Not on file    Active member of club or organization: Not on file    Attends meetings of clubs or organizations: Not on file    Relationship status: Not on file  . Intimate partner violence:    Fear of current or ex partner: Not on file    Emotionally abused: Not on file    Physically abused: Not on file    Forced sexual activity: Not on file  Other Topics Concern  . Not on file  Social History Narrative   Married  Allergies  Allergen Reactions  . Allopurinol Diarrhea, Other (See Comments) and Nausea And Vomiting    Other Reaction: GI Upset  . Atenolol Other (See Comments)    Other Reaction: bradycardia  . Atorvastatin Other (See Comments)    Other Reaction: muscle aches  . Ramipril Rash  . Rosuvastatin Other (See Comments)    Muscle aches  . Simvastatin Other (See Comments)    Other Reaction: MUSCLE ACHES (Zocor)  . Valsartan Other (See Comments)    Other Reaction: facial swelling  . Cardizem [Diltiazem] Rash     Outpatient Medications Prior to Visit  Medication Sig Dispense Refill  . amLODipine (NORVASC) 10 MG tablet Take 1 tablet (10 mg total) by mouth daily. 30 tablet 0  . amoxicillin-clavulanate (AUGMENTIN) 875-125 MG tablet Take 1 tablet by mouth every 12 (twelve) hours for 10 days. 20 tablet 0  . carvedilol (COREG) 25 MG tablet Take 1 tablet (25 mg total) by mouth 2 (two) times daily with a meal. 180 tablet 1  . chlorpheniramine-HYDROcodone (TUSSIONEX) 10-8 MG/5ML SUER Take 5 mLs by mouth every 12 (twelve) hours. 115 mL 0  . Cholecalciferol 50000 units TABS Take 1 tablet by mouth once a week. (Patient taking differently: Take  50,000 Units by mouth every Saturday. ) 13 tablet 1  . cholestyramine light (PREVALITE) 4 g packet Take 4 g by mouth every morning.     . clopidogrel (PLAVIX) 75 MG tablet Take 75 mg by mouth daily.    . colchicine 0.6 MG tablet TAKE 1 TABLET BY MOUTH ONCE DAILY (Patient taking differently: TAKE 0.6 MG BY MOUTH ONCE DAILY) 60 tablet 0  . diclofenac sodium (VOLTAREN) 1 % GEL Apply 2 g topically 4 (four) times daily as needed (for pain).     Marland Kitchen dicyclomine (BENTYL) 10 MG capsule Take 10 mg by mouth 3 (three) times daily before meals.     . fluticasone (FLONASE) 50 MCG/ACT nasal spray Place 2 sprays into both nostrils daily. 8 g 0  . gabapentin (NEURONTIN) 300 MG capsule take 2 capsules (668m) by mouth three times a day 180 capsule 1  . guaiFENesin (MUCINEX) 600 MG 12 hr tablet Take 1 tablet (600 mg total) by mouth 2 (two) times daily. 30 tablet 0  . hydrALAZINE (APRESOLINE) 100 MG tablet TAKE 100 MG BY MOUTH THREE TIMES A DAY 270 tablet 1  . HYDROcodone-acetaminophen (NORCO) 5-325 MG tablet Take 1 tablet by mouth every 4 (four) hours as needed for severe pain (try not to take more than 2 a day.). 6 tablet 0  . insulin lispro (HUMALOG) 100 UNIT/ML KiwkPen Inject 14-32u under the skin three times daily before meals according to sliding scale    . ipratropium (ATROVENT) 0.06 % nasal spray Place 2 sprays into both nostrils 4 (four) times daily. 15 mL 12  . isosorbide mononitrate (IMDUR) 120 MG 24 hr tablet TAKE 1 TABLET BY MOUTH EVERY DAY 90 tablet 1  . LEVEMIR FLEXTOUCH 100 UNIT/ML Pen Inject 80 Units into the skin daily at 10 pm. 15 mL 1  . levocetirizine (XYZAL) 5 MG tablet Take 1 tablet (5 mg total) by mouth every evening.    . montelukast (SINGULAIR) 10 MG tablet Take 1 tablet (10 mg total) by mouth at bedtime. 90 tablet 0  . Multiple Vitamin (MULTI-VITAMINS) TABS Take 1 tablet by mouth daily.    . nitroGLYCERIN (NITROSTAT) 0.4 MG SL tablet Place 1 tablet (0.4 mg total) under the tongue every 5  (five) minutes x  3 doses as needed. For chest pain.  Call 911 if no relief after 3 tablets 30 tablet 11  . omeprazole (PRILOSEC) 40 MG capsule Take 40 mg by mouth 2 (two) times daily.     . pravastatin (PRAVACHOL) 40 MG tablet TAKE 1 TABLET BY MOUTH ONCE DAILY 90 tablet 0  . probenecid (BENEMID) 500 MG tablet Take 500 mg by mouth 2 (two) times daily.   0  . silodosin (RAPAFLO) 8 MG CAPS capsule Take 1 capsule (8 mg total) by mouth daily with breakfast. 30 capsule 0  . vitamin C (ASCORBIC ACID) 500 MG tablet Take 500 mg by mouth daily.     . Vitamin E 400 units TABS Take 400 Units by mouth 2 (two) times daily.    Marland Kitchen warfarin (COUMADIN) 2 MG tablet Take 4 mg daily except sat, Sunday take 6 mg 90 tablet 3  . warfarin (COUMADIN) 4 MG tablet Take 1 tablet (4 mg total) by mouth daily at 6 PM. Except sat and Sunday take 6 mg total daily. 90 tablet 3   No facility-administered medications prior to visit.     ROS    Objective:  Physical Exam   There were no vitals filed for this visit.  ***  CBC    Component Value Date/Time   WBC 11.7 (H) 02/19/2018 2125   RBC 3.75 (L) 02/19/2018 2125   HGB 12.2 (L) 02/19/2018 2125   HCT 34.9 (L) 02/19/2018 2125   PLT 311 02/19/2018 2125   MCV 93.3 02/19/2018 2125   MCH 32.6 02/19/2018 2125   MCHC 35.0 02/19/2018 2125   RDW 14.9 (H) 02/19/2018 2125   LYMPHSABS 1.5 02/09/2018 1654   MONOABS 1.2 (H) 02/09/2018 1654   EOSABS 0.8 (H) 02/09/2018 1654   BASOSABS 0.1 02/09/2018 1654   BASOSABS 0 06/18/2014 1330     Chest imaging:  PFT:  Labs:  Path:  Echo:  Heart Catheterization:       Assessment & Plan:   No diagnosis found.  Discussion: ***    Current Outpatient Medications:  .  amLODipine (NORVASC) 10 MG tablet, Take 1 tablet (10 mg total) by mouth daily., Disp: 30 tablet, Rfl: 0 .  amoxicillin-clavulanate (AUGMENTIN) 875-125 MG tablet, Take 1 tablet by mouth every 12 (twelve) hours for 10 days., Disp: 20 tablet, Rfl: 0 .   carvedilol (COREG) 25 MG tablet, Take 1 tablet (25 mg total) by mouth 2 (two) times daily with a meal., Disp: 180 tablet, Rfl: 1 .  chlorpheniramine-HYDROcodone (TUSSIONEX) 10-8 MG/5ML SUER, Take 5 mLs by mouth every 12 (twelve) hours., Disp: 115 mL, Rfl: 0 .  Cholecalciferol 50000 units TABS, Take 1 tablet by mouth once a week. (Patient taking differently: Take 50,000 Units by mouth every Saturday. ), Disp: 13 tablet, Rfl: 1 .  cholestyramine light (PREVALITE) 4 g packet, Take 4 g by mouth every morning. , Disp: , Rfl:  .  clopidogrel (PLAVIX) 75 MG tablet, Take 75 mg by mouth daily., Disp: , Rfl:  .  colchicine 0.6 MG tablet, TAKE 1 TABLET BY MOUTH ONCE DAILY (Patient taking differently: TAKE 0.6 MG BY MOUTH ONCE DAILY), Disp: 60 tablet, Rfl: 0 .  diclofenac sodium (VOLTAREN) 1 % GEL, Apply 2 g topically 4 (four) times daily as needed (for pain). , Disp: , Rfl:  .  dicyclomine (BENTYL) 10 MG capsule, Take 10 mg by mouth 3 (three) times daily before meals. , Disp: , Rfl:  .  fluticasone (FLONASE) 50 MCG/ACT nasal spray,  Place 2 sprays into both nostrils daily., Disp: 8 g, Rfl: 0 .  gabapentin (NEURONTIN) 300 MG capsule, take 2 capsules (650m) by mouth three times a day, Disp: 180 capsule, Rfl: 1 .  guaiFENesin (MUCINEX) 600 MG 12 hr tablet, Take 1 tablet (600 mg total) by mouth 2 (two) times daily., Disp: 30 tablet, Rfl: 0 .  hydrALAZINE (APRESOLINE) 100 MG tablet, TAKE 100 MG BY MOUTH THREE TIMES A DAY, Disp: 270 tablet, Rfl: 1 .  HYDROcodone-acetaminophen (NORCO) 5-325 MG tablet, Take 1 tablet by mouth every 4 (four) hours as needed for severe pain (try not to take more than 2 a day.)., Disp: 6 tablet, Rfl: 0 .  insulin lispro (HUMALOG) 100 UNIT/ML KiwkPen, Inject 14-32u under the skin three times daily before meals according to sliding scale, Disp: , Rfl:  .  ipratropium (ATROVENT) 0.06 % nasal spray, Place 2 sprays into both nostrils 4 (four) times daily., Disp: 15 mL, Rfl: 12 .  isosorbide  mononitrate (IMDUR) 120 MG 24 hr tablet, TAKE 1 TABLET BY MOUTH EVERY DAY, Disp: 90 tablet, Rfl: 1 .  LEVEMIR FLEXTOUCH 100 UNIT/ML Pen, Inject 80 Units into the skin daily at 10 pm., Disp: 15 mL, Rfl: 1 .  levocetirizine (XYZAL) 5 MG tablet, Take 1 tablet (5 mg total) by mouth every evening., Disp: , Rfl:  .  montelukast (SINGULAIR) 10 MG tablet, Take 1 tablet (10 mg total) by mouth at bedtime., Disp: 90 tablet, Rfl: 0 .  Multiple Vitamin (MULTI-VITAMINS) TABS, Take 1 tablet by mouth daily., Disp: , Rfl:  .  nitroGLYCERIN (NITROSTAT) 0.4 MG SL tablet, Place 1 tablet (0.4 mg total) under the tongue every 5 (five) minutes x 3 doses as needed. For chest pain.  Call 911 if no relief after 3 tablets, Disp: 30 tablet, Rfl: 11 .  omeprazole (PRILOSEC) 40 MG capsule, Take 40 mg by mouth 2 (two) times daily. , Disp: , Rfl:  .  pravastatin (PRAVACHOL) 40 MG tablet, TAKE 1 TABLET BY MOUTH ONCE DAILY, Disp: 90 tablet, Rfl: 0 .  probenecid (BENEMID) 500 MG tablet, Take 500 mg by mouth 2 (two) times daily. , Disp: , Rfl: 0 .  silodosin (RAPAFLO) 8 MG CAPS capsule, Take 1 capsule (8 mg total) by mouth daily with breakfast., Disp: 30 capsule, Rfl: 0 .  vitamin C (ASCORBIC ACID) 500 MG tablet, Take 500 mg by mouth daily. , Disp: , Rfl:  .  Vitamin E 400 units TABS, Take 400 Units by mouth 2 (two) times daily., Disp: , Rfl:  .  warfarin (COUMADIN) 2 MG tablet, Take 4 mg daily except sat, Sunday take 6 mg, Disp: 90 tablet, Rfl: 3 .  warfarin (COUMADIN) 4 MG tablet, Take 1 tablet (4 mg total) by mouth daily at 6 PM. Except sat and Sunday take 6 mg total daily., Disp: 90 tablet, Rfl: 3

## 2018-02-20 NOTE — Discharge Instructions (Addendum)
Please return for worse pain fever vomiting or feeling sicker. For now continue the Coumadin. Please talk to your primary care doctor about whether or not you'll have to stay on it permanently. I will give you some hydrocodone if you needed for severe pain. Be very careful to make you constipated and very sleepy. You can fall and break something. The instructions on the prescription or 1 pill 4 times a day for severe pain and I will try if at all possible to limited to one pill 2 times a day. I do not want to get you more sleepy. The CAT scan says that the size of the hematoma is actually smaller. This is a good sign.

## 2018-02-21 ENCOUNTER — Encounter: Payer: Self-pay | Admitting: Internal Medicine

## 2018-02-21 ENCOUNTER — Ambulatory Visit (INDEPENDENT_AMBULATORY_CARE_PROVIDER_SITE_OTHER): Payer: Medicare Other | Admitting: Internal Medicine

## 2018-02-21 VITALS — BP 142/60 | HR 69 | Temp 98.2°F | Ht 68.0 in

## 2018-02-21 DIAGNOSIS — S301XXA Contusion of abdominal wall, initial encounter: Secondary | ICD-10-CM

## 2018-02-21 DIAGNOSIS — S3011XA Contusion of abdominal wall, initial encounter: Secondary | ICD-10-CM | POA: Insufficient documentation

## 2018-02-21 NOTE — Patient Instructions (Addendum)
Stop Coumadin  Refer to to Dr. Bary Castilla surgery   Hematoma A hematoma is a collection of blood under the skin, in an organ, in a body space, in a joint space, or in other tissue. The blood can thicken (clot) to form a lump that you can see and feel. The lump is often firm and may become sore and tender. Most hematomas get better in a few days to weeks. However, some hematomas may be serious and require medical care. Hematomas can range from very small to very large. What are the causes? This condition is caused by:  A blunt or penetrating injury.  A leakage from a blood vessel under the skin. This leak happens on its own (is spontaneous) and is more likely to occur in older people, especially those who take blood thinners.  Some medical procedures including surgeries, such as oral surgery, face lifts, and surgeries that involve the joints.  Some medical conditions that cause bleeding or bruising problems. There may be multiple hematomas that appear in different areas of the body.  What are the signs or symptoms? Symptoms of this condition can depend on where the hematoma is located. Common symptoms of a hematoma under the skin include:  A firm lump on the body.  Pain and tenderness in the area.  Bruising. Blue, dark blue, purple-red, or yellowish skin (discoloration) may appear at the site of the hematoma if the hematoma is close to the surface of the skin.  For hematomas in deeper tissues or body spaces, symptoms may be less obvious. A collection of blood in the stomach (intra-abdominal hematoma) may cause pain in the abdomen, weakness, fainting, and shortness of breath. A collection of blood in the head (intracranial hematoma) may cause a headache or symptoms such as weakness, trouble speaking or understanding, or a change in consciousness. How is this diagnosed? This condition is diagnosed based on:  Your medical history.  A physical exam.  Imaging tests, such as an ultrasonogram  or CT scan. These may be needed if your health care provider suspects a hematoma in deeper tissues or body spaces.  Blood tests. These may be needed if your health care provider believes that the hematoma is caused by a medical condition.  How is this treated? This condition usually does not need treatment because many hematomas go away on their own over time. However, large hematomas, or those that may affect vital organs, may need surgical drainage or monitoring. If the hematoma is caused by a medical condition, medicines may be prescribed. Follow these instructions at home: Managing pain, stiffness, and swelling  If directed, apply ice to the affected area: ? Put ice in a plastic bag. ? Place a towel between your skin and the bag. ? Leave the ice on for 20 minutes, 2-3 times a day for the first couple of days.  After applying ice for a couple of days, your health care provider may recommend that you apply warm compresses to the affected area instead. Do this as told by your health care provider. Remove the heat if your skin turns bright red. This is especially important if you are unable to feel pain, heat, or cold. You may have a greater risk of getting burned  Raise (elevate) the affected area above the level of your heart while you are sitting or lying down.  Wrap the affected area with an elastic bandage, if told by your health care provider. The bandage applies pressure (compression) to the area, which may help  to reduce swelling and help the hematoma heal. Make sure the bandage is not wrapped too tight.  If your hematoma is on a leg or foot (lower extremity) and is painful, your health care provider may recommend crutches. Use them as told by your health care provider. General instructions  Take over-the-counter and prescription medicines only as told by your health care provider.  Keep all follow-up visits as told by your health care provider. This is important. Contact a health  care provider if:  You have a fever.  The swelling or discoloration gets worse.  You develop more hematomas. Get help right away if:  Your pain is worse or your pain is not controlled with medicine.  Your skin over the hematoma breaks or starts bleeding.  Your hematoma is in your chest or abdomen and you have weakness, shortness of breath, or a change in consciousness.  You have a hematoma on your scalp caused by a fall or injury and you have a headache that gets worse, trouble speaking or understanding, weakness, or a change in alertness or consciousness. Summary  A hematoma is a collection of blood under the skin, in an organ, in a body space, in a joint space, or in other tissue.  This condition usually does not need treatment because many hematomas go away on their own over time.  Large hematomas, or those that may affect vital organs, may need surgical drainage or monitoring. If the hematoma is caused by a medical condition, medicines may be prescribed. This information is not intended to replace advice given to you by your health care provider. Make sure you discuss any questions you have with your health care provider. Document Released: 04/13/2004 Document Revised: 10/02/2016 Document Reviewed: 10/02/2016 Elsevier Interactive Patient Education  2018 [employer].

## 2018-02-21 NOTE — Progress Notes (Signed)
Pre visit review using our clinic review tool, if applicable. No additional management support is needed unless otherwise documented below in the visit note. 

## 2018-02-21 NOTE — Progress Notes (Signed)
Chief Complaint  Patient presents with  . Follow-up   F/u  1. C/o left ab pain 02/18/18 was like a ball after urination pain improved CT + left rectus sheath hematoma 8.9 x 4.5 cm 02/19/18 7.9 x 4.5 cm noted seen in the ED. Pain is 3/10 was more painful than that. He did not have trauma and this is not where he gives himself insulin. He was on coumadin 4 mg most days except sat/sunday 6 mg. Inr was 2.21 on 02/19/18 and he is still taking it with plavix. He is on plavix for CAD s/p PCI, CABG, renal artery stent. Denies blood in stool   Review of Systems  HENT: Positive for hearing loss.   Eyes: Negative for blurred vision.  Respiratory: Negative for shortness of breath.   Cardiovascular: Negative for chest pain.  Gastrointestinal: Positive for abdominal pain. Negative for blood in stool.  Endo/Heme/Allergies:       No bruising on abdomen    Past Medical History:  Diagnosis Date  . Arthritis   . Atrial fibrillation ([place])   . CAD (coronary artery disease)    6 STENTS  . CHF (congestive heart failure) ([place])   . Chicken pox   . Colon polyp   . Corneal dystrophy    bilateral  . Crohn's disease ([place])   . Diabetes ([place]) 2002  . Dysrhythmia    A-FIB AND PAF  . GERD (gastroesophageal reflux disease)   . Gout   . History of kidney stones   . History of shingles   . Hyperlipemia   . Hypertension   . Kidney stones   . Myocardial infarction ([place])    x4, last one in 2010  . Peripheral neuropathy   . Peripheral neuropathy   . Peripheral neuropathy   . PUD (peptic ulcer disease)   . Renal artery stenosis ([place])   . Renal artery stenosis ([place])   . Salzmann's nodular dystrophy 2012  . Sleep apnea   . Spinal stenosis    Past Surgical History:  Procedure Laterality Date  . ABLATION  2012  . APPLICATION VERTERBRAL DEFECT PROSTHETIC  05/01/2013  . ARTHRODESIS ANTERIOR LUMBAR SPINE  05/01/2013  . BACK SURGERY     lumbar fusion  . CARDIAC CATHETERIZATION    . CARDIAC ELECTROPHYSIOLOGY STUDY  AND ABLATION    . CARDIAC SURGERY    . CARDIOVERSION    . CHOLECYSTECTOMY    . COLONOSCOPY WITH PROPOFOL N/A 04/09/2016   Completed for chronic diarrhea.  Few scattered diverticuli.  No mucosal lesions.  Surgeon: Lollie Sails, MD;  Location: [facility] ENDOSCOPY;  Service: Endoscopy;  Laterality: N/A;  . CORNEAL EYE SURGERY Bilateral 07/28/11    09/22/2011  . CORONARY ANGIOPLASTY    . CORONARY ANGIOPLASTY WITH STENT PLACEMENT  03/28/2015   Distal 80% to normal Stent, Dilation Balloon  . CORONARY ARTERY BYPASS GRAFT     4 VESSELS  . EYE SURGERY     bilateral cataract  . foot and ankle repair Right 2005  . FRACTURE SURGERY     ANKLE PLATE AND SCREWS  . GALLBLADER    . JOINT REPLACEMENT     left total hip 11/11/15  . JOINT REPLACEMENT     right total hip 12/2017 Dr. Rudene Christians   . KNEE ARTHROSCOPY Right   . LUMBAR SPINE FUSION ONE LEVEL  05/03/2013  . RENAL ARTERY STENT Right   . ROTATOR CUFF REPAIR Right 2006  . TONSILLECTOMY    . TOTAL HIP ARTHROPLASTY  Left 11/11/2015   Procedure: TOTAL HIP ARTHROPLASTY ANTERIOR APPROACH;  Surgeon: Hessie Knows, MD;  Location: [facility] ORS;  Service: Orthopedics;  Laterality: Left;  . TOTAL HIP ARTHROPLASTY Right 12/13/2017   Procedure: TOTAL HIP ARTHROPLASTY ANTERIOR APPROACH;  Surgeon: Hessie Knows, MD;  Location: [facility] ORS;  Service: Orthopedics;  Laterality: Right;  . TRANSCATH PLACEMENT INTRAVASCULAR STENT LEG  03/2015   Family History  Problem Relation Age of Onset  . CVA Father   . Stroke Father   . Lung cancer Mother   . Arthritis Mother   . Stomach cancer Sister   . Colon cancer Sister   . Diabetes Brother    Social History   Socioeconomic History  . Marital status: Married    Spouse name: Not on file  . Number of children: Not on file  . Years of education: Not on file  . Highest education level: Not on file  Occupational History  . Not on file  Social Needs  . Financial resource strain: Not on file  . Food insecurity:    Worry: Not on  file    Inability: Not on file  . Transportation needs:    Medical: Not on file    Non-medical: Not on file  Tobacco Use  . Smoking status: Former Smoker    Packs/day: 1.00    Years: 3.00    Pack years: 3.00    Types: Cigarettes    Last attempt to quit: 06/30/1962    Years since quitting: 55.6  . Smokeless tobacco: Former Systems developer    Types: [place] date: 12/05/1968  Substance and Sexual Activity  . Alcohol use: No  . Drug use: No  . Sexual activity: Not on file  Lifestyle  . Physical activity:    Days per week: Not on file    Minutes per session: Not on file  . Stress: Not on file  Relationships  . Social connections:    Talks on phone: Not on file    Gets together: Not on file    Attends religious service: Not on file    Active member of club or organization: Not on file    Attends meetings of clubs or organizations: Not on file    Relationship status: Not on file  . Intimate partner violence:    Fear of current or ex partner: Not on file    Emotionally abused: Not on file    Physically abused: Not on file    Forced sexual activity: Not on file  Other Topics Concern  . Not on file  Social History Narrative   Married    Current Meds  Medication Sig  . amLODipine (NORVASC) 10 MG tablet Take 1 tablet (10 mg total) by mouth daily.  . carvedilol (COREG) 25 MG tablet Take 1 tablet (25 mg total) by mouth 2 (two) times daily with a meal.  . Cholecalciferol 50000 units TABS Take 1 tablet by mouth once a week. (Patient taking differently: Take 50,000 Units by mouth every Saturday. )  . cholestyramine light (PREVALITE) 4 g packet Take 4 g by mouth every morning.   . clopidogrel (PLAVIX) 75 MG tablet Take 75 mg by mouth daily.  . colchicine 0.6 MG tablet TAKE 1 TABLET BY MOUTH ONCE DAILY (Patient taking differently: TAKE 0.6 MG BY MOUTH ONCE DAILY)  . diclofenac sodium (VOLTAREN) 1 % GEL Apply 2 g topically 4 (four) times daily as needed (for pain).   Marland Kitchen dicyclomine (BENTYL)  10 MG  capsule Take 10 mg by mouth 3 (three) times daily before meals.   . fluticasone (FLONASE) 50 MCG/ACT nasal spray Place 2 sprays into both nostrils daily.  Marland Kitchen gabapentin (NEURONTIN) 300 MG capsule take 2 capsules (65m) by mouth three times a day  . guaiFENesin (MUCINEX) 600 MG 12 hr tablet Take 1 tablet (600 mg total) by mouth 2 (two) times daily.  . hydrALAZINE (APRESOLINE) 100 MG tablet TAKE 100 MG BY MOUTH THREE TIMES A DAY  . HYDROcodone-acetaminophen (NORCO) 5-325 MG tablet Take 1 tablet by mouth every 4 (four) hours as needed for severe pain (try not to take more than 2 a day.).  .Mar[place] Kitcheninsulin lispro (HUMALOG) 100 UNIT/ML KiwkPen Inject 14-32u under the skin three times daily before meals according to sliding scale  . ipratropium (ATROVENT) 0.06 % nasal spray Place 2 sprays into both nostrils 4 (four) times daily.  . isosorbide mononitrate (IMDUR) 120 MG 24 hr tablet TAKE 1 TABLET BY MOUTH EVERY DAY  . LEVEMIR FLEXTOUCH 100 UNIT/ML Pen Inject 80 Units into the skin daily at 10 pm.  . levocetirizine (XYZAL) 5 MG tablet Take 1 tablet (5 mg total) by mouth every evening.  . montelukast (SINGULAIR) 10 MG tablet Take 1 tablet (10 mg total) by mouth at bedtime.  . Multiple Vitamin (MULTI-VITAMINS) TABS Take 1 tablet by mouth daily.  . nitroGLYCERIN (NITROSTAT) 0.4 MG SL tablet Place 1 tablet (0.4 mg total) under the tongue every 5 (five) minutes x 3 doses as needed. For chest pain.  Call 911 if no relief after 3 tablets  . omeprazole (PRILOSEC) 40 MG capsule Take 40 mg by mouth 2 (two) times daily.   . pravastatin (PRAVACHOL) 40 MG tablet TAKE 1 TABLET BY MOUTH ONCE DAILY  . probenecid (BENEMID) 500 MG tablet Take 500 mg by mouth 2 (two) times daily.   . silodosin (RAPAFLO) 8 MG CAPS capsule Take 1 capsule (8 mg total) by mouth daily with breakfast.  . vitamin C (ASCORBIC ACID) 500 MG tablet Take 500 mg by mouth daily.   . Vitamin E 400 units TABS Take 400 Units by mouth 2 (two) times daily.  .Marland Kitchen warfarin (COUMADIN) 2 MG tablet Take 4 mg daily except sat, Sunday take 6 mg  . warfarin (COUMADIN) 4 MG tablet Take 1 tablet (4 mg total) by mouth daily at 6 PM. Except sat and Sunday take 6 mg total daily.   Allergies  Allergen Reactions  . Allopurinol Diarrhea, Other (See Comments) and Nausea And Vomiting    Other Reaction: GI Upset  . Atenolol Other (See Comments)    Other Reaction: bradycardia  . Atorvastatin Other (See Comments)    Other Reaction: muscle aches  . Ramipril Rash  . Rosuvastatin Other (See Comments)    Muscle aches  . Simvastatin Other (See Comments)    Other Reaction: MUSCLE ACHES (Zocor)  . Valsartan Other (See Comments)    Other Reaction: facial swelling  . Cardizem [Diltiazem] Rash   Recent Results (from the past 2160 hour(s))  Glucose, capillary     Status: Abnormal   Collection Time: 11/30/17 10:23 AM  Result Value Ref Range   Glucose-Capillary 113 (H) 65 - 99 mg/dL  Glucose, capillary     Status: Abnormal   Collection Time: 12/13/17 12:00 PM  Result Value Ref Range   Glucose-Capillary 263 (H) 65 - 99 mg/dL  Type and screen [place]    Status: None   Collection Time: 12/13/17 12:32  PM  Result Value Ref Range   ABO/RH(D) A POS    Antibody Screen NEG    Sample Expiration      12/16/2017 Performed at [facility], [address] [place], [place] [address]   Protime-INR     Status: None   Collection Time: 12/13/17 12:32 PM  Result Value Ref Range   Prothrombin Time 14.3 11.4 - 15.2 seconds   INR 1.12     Comment: Performed at [facility], [address], [place], [place] [address]  Surgical pathology     Status: None   Collection Time: 12/13/17  4:03 PM  Result Value Ref Range   SURGICAL PATHOLOGY      Surgical Pathology CASE: [id number] PATIENT: Hilton Sinclair Surgical Pathology Report     SPECIMEN SUBMITTED: A. Femoral head, right  CLINICAL HISTORY: None provided  PRE-OPERATIVE  DIAGNOSIS: Primary osteoarthritis of right hip  POST-OPERATIVE DIAGNOSIS: None provided.     DIAGNOSIS: A. RIGHT FEMORAL HEAD; ARTHROPLASTY: - OSTEOARTHRITIS.   GROSS DESCRIPTION:  A. Labeled: right femoral head  Size of specimen:      Head -4.5 x 4.6 cm      Neck -3.2 x 2.9 cm  Articular surface: yellow-tan with focal disruption  Cut surface: pink-tan  Other findings: received in formalin  Block summary: 1 - representative section(s), post decalcification    Final Diagnosis performed by Delorse Lek, MD.  Electronically signed 12/16/2017 1:21:33PM    The electronic signature indicates that the named Attending Pathologist has evaluated the specimen  Technical component performed at [facility], [address] [place], [place] [address] Lab: [phone] Dir: Sa Lenard Galloway, MD, MMM  Professional component performed at [facility], [facility], [address], [place], [place] [address] Lab: [phone] Dir: Dellia Nims. Rubinas, MD    Glucose, capillary     Status: Abnormal   Collection Time: 12/13/17  4:42 PM  Result Value Ref Range   Glucose-Capillary 168 (H) 65 - 99 mg/dL  Glucose, capillary     Status: Abnormal   Collection Time: 12/13/17  7:01 PM  Result Value Ref Range   Glucose-Capillary 254 (H) 65 - 99 mg/dL   Comment 1 Notify RN   Glucose, capillary     Status: Abnormal   Collection Time: 12/13/17  9:00 PM  Result Value Ref Range   Glucose-Capillary 223 (H) 65 - 99 mg/dL   Comment 1 Notify RN   Protime-INR     Status: None   Collection Time: 12/14/17  4:48 AM  Result Value Ref Range   Prothrombin Time 14.6 11.4 - 15.2 seconds   INR 1.15     Comment: Performed at [facility], [address] [place], [place] [address]  Glucose, capillary     Status: Abnormal   Collection Time: 12/14/17  7:57 AM  Result Value Ref Range   Glucose-Capillary 241 (H) 65 - 99 mg/dL  CBC     Status: Abnormal   Collection Time: 12/14/17 10:16  AM  Result Value Ref Range   WBC 8.8 3.8 - 10.6 K/uL   RBC 3.35 (L) 4.40 - 5.90 MIL/uL   Hemoglobin 11.5 (L) 13.0 - 18.0 g/dL   HCT 33.8 (L) 40.0 - 52.0 %   MCV 101.1 (H) 80.0 - 100.0 fL   MCH 34.2 (H) 26.0 - 34.0 pg   MCHC 33.9 32.0 - 36.0 g/dL   RDW 13.9 11.5 - 14.5 %   Platelets 222 150 - 440 K/uL  Comment: Performed at [facility], [address] [place], [place] [address]  Basic metabolic panel     Status: Abnormal   Collection Time: 12/14/17 11:08 AM  Result Value Ref Range   Sodium 133 (L) 135 - 145 mmol/L   Potassium 3.6 3.5 - 5.1 mmol/L   Chloride 100 (L) 101 - 111 mmol/L   CO2 24 22 - 32 mmol/L   Glucose, Bld 279 (H) 65 - 99 mg/dL   BUN 14 6 - 20 mg/dL   Creatinine, Ser 1.06 0.61 - 1.24 mg/dL   Calcium 8.3 (L) 8.9 - 10.3 mg/dL   GFR calc non Af Amer >60 >60 mL/min   GFR calc Af Amer >60 >60 mL/min    Comment: (NOTE) The eGFR has been calculated using the CKD EPI equation. This calculation has not been validated in all clinical situations. eGFR's persistently <60 mL/min signify possible Chronic Kidney Disease.    Anion gap 9 5 - 15    Comment: Performed at [facility], [address] [place], [place] [address]  Glucose, capillary     Status: Abnormal   Collection Time: 12/14/17 11:57 AM  Result Value Ref Range   Glucose-Capillary 260 (H) 65 - 99 mg/dL  Glucose, capillary     Status: Abnormal   Collection Time: 12/14/17  4:14 PM  Result Value Ref Range   Glucose-Capillary 213 (H) 65 - 99 mg/dL   Comment 1 Notify RN   Glucose, capillary     Status: Abnormal   Collection Time: 12/14/17  6:01 PM  Result Value Ref Range   Glucose-Capillary 195 (H) 65 - 99 mg/dL  CULTURE, BLOOD (ROUTINE X 2) w Reflex to ID Panel     Status: None   Collection Time: 12/14/17  6:08 PM  Result Value Ref Range   Specimen Description BLOOD BLOOD RIGHT ARM    Special Requests      BOTTLES DRAWN AEROBIC AND ANAEROBIC Blood Culture adequate volume   Culture       NO GROWTH 5 DAYS Performed at [facility], [address] [place], [place] [address]    Report Status 12/19/2017 FINAL   Blood gas, venous     Status: Abnormal   Collection Time: 12/14/17  6:30 PM  Result Value Ref Range   pH, Ven 7.44 (H) 7.250 - 7.430   pCO2, Ven 39 (L) 44.0 - 60.0 mmHg   pO2, Ven 41.0 32.0 - 45.0 mmHg   Bicarbonate 26.5 20.0 - 28.0 mmol/L   Acid-Base Excess 2.2 (H) 0.0 - 2.0 mmol/L   O2 Saturation 78.4 %   Patient temperature 37.0    Collection site VEIN    Sample type VENOUS     Comment: Performed at [facility], [address] [place], [place] [address]  CBC     Status: Abnormal   Collection Time: 12/14/17  6:32 PM  Result Value Ref Range   WBC 8.6 3.8 - 10.6 K/uL   RBC 3.45 (L) 4.40 - 5.90 MIL/uL   Hemoglobin 11.7 (L) 13.0 - 18.0 g/dL   HCT 34.2 (L) 40.0 - 52.0 %   MCV 99.1 80.0 - 100.0 fL   MCH 33.9 26.0 - 34.0 pg   MCHC 34.2 32.0 - 36.0 g/dL   RDW 13.6 11.5 - 14.5 %   Platelets 204 150 - 440 K/uL    Comment: Performed at [facility], [address], [place], [place] [address]  Basic metabolic panel  Status: Abnormal   Collection Time: 12/14/17  6:32 PM  Result Value Ref Range   Sodium 134 (L) 135 - 145 mmol/L   Potassium 4.0 3.5 - 5.1 mmol/L   Chloride 100 (L) 101 - 111 mmol/L   CO2 24 22 - 32 mmol/L   Glucose, Bld 224 (H) 65 - 99 mg/dL   BUN 13 6 - 20 mg/dL   Creatinine, Ser 0.93 0.61 - 1.24 mg/dL   Calcium 8.7 (L) 8.9 - 10.3 mg/dL   GFR calc non Af Amer >60 >60 mL/min   GFR calc Af Amer >60 >60 mL/min    Comment: (NOTE) The eGFR has been calculated using the CKD EPI equation. This calculation has not been validated in all clinical situations. eGFR's persistently <60 mL/min signify possible Chronic Kidney Disease.    Anion gap 10 5 - 15    Comment: Performed at [facility], [address] [place], [place] [address]  APTT     Status: None   Collection Time: 12/14/17  6:32 PM  Result Value  Ref Range   aPTT 30 24 - 36 seconds    Comment: Performed at [facility], [address] [place], [place] [address]  Blood gas, arterial     Status: Abnormal   Collection Time: 12/14/17  7:52 PM  Result Value Ref Range   FIO2 0.36    Delivery systems NASAL CANNULA    pH, Arterial 7.45 7.350 - 7.450   pCO2 arterial 37 32.0 - 48.0 mmHg   pO2, Arterial 88 83.0 - 108.0 mmHg   Bicarbonate 26.9 20.0 - 28.0 mmol/L   Acid-Base Excess 3.2 (H) 0.0 - 2.0 mmol/L   O2 Saturation 96.7 %   Patient temperature 38.1    Collection site RIGHT RADIAL    Sample type ARTERIAL DRAW    Allens test (pass/fail) PASS PASS    Comment: Performed at [facility], [address] [place], [place] [address]  Urinalysis, Complete w Microscopic     Status: Abnormal   Collection Time: 12/14/17 10:12 PM  Result Value Ref Range   Color, Urine STRAW (A) YELLOW   APPearance CLEAR (A) CLEAR   Specific Gravity, Urine 1.006 1.005 - 1.030   pH 5.0 5.0 - 8.0   Glucose, UA NEGATIVE NEGATIVE mg/dL   Hgb urine dipstick NEGATIVE NEGATIVE   Bilirubin Urine NEGATIVE NEGATIVE   Ketones, ur NEGATIVE NEGATIVE mg/dL   Protein, ur NEGATIVE NEGATIVE mg/dL   Nitrite NEGATIVE NEGATIVE   Leukocytes, UA NEGATIVE NEGATIVE   RBC / HPF NONE SEEN 0 - 5 RBC/hpf   WBC, UA 0-5 0 - 5 WBC/hpf   Bacteria, UA NONE SEEN NONE SEEN   Squamous Epithelial / LPF NONE SEEN NONE SEEN    Comment: Performed at [facility], [address], [place], [place] [address]  Urine Culture     Status: None   Collection Time: 12/14/17 10:12 PM  Result Value Ref Range   Specimen Description      URINE, RANDOM Performed at [facility], [address] [place], [place] [address]    Special Requests      NONE Performed at [facility], [address], [place], [place] [address]    Culture      NO GROWTH Performed at [facility], [address] [place], [place] [address]    Report Status 12/16/2017  FINAL   Lactic acid, plasma     Status: None  Collection Time: 12/15/17 12:24 AM  Result Value Ref Range   Lactic Acid, Venous 1.2 0.5 - 1.9 mmol/L    Comment: Performed at [facility], [address] [place], [place] [address]  CULTURE, BLOOD (ROUTINE X 2) w Reflex to ID Panel     Status: None   Collection Time: 12/15/17 12:24 AM  Result Value Ref Range   Specimen Description BLOOD RIGHT HAND    Special Requests      BOTTLES DRAWN AEROBIC AND ANAEROBIC Blood Culture results may not be optimal due to an excessive volume of blood received in culture bottles   Culture      NO GROWTH 5 DAYS Performed at [facility], [address], [place], [place] [address]    Report Status 12/20/2017 FINAL   Procalcitonin - Baseline     Status: None   Collection Time: 12/15/17 12:24 AM  Result Value Ref Range   Procalcitonin 0.21 ng/mL    Comment:        Interpretation: PCT (Procalcitonin) <= 0.5 ng/mL: Systemic infection (sepsis) is not likely. Local bacterial infection is possible. (NOTE)       Sepsis PCT Algorithm           Lower Respiratory Tract                                      Infection PCT Algorithm    ----------------------------     ----------------------------         PCT < 0.25 ng/mL                PCT < 0.10 ng/mL         Strongly encourage             Strongly discourage   discontinuation of antibiotics    initiation of antibiotics    ----------------------------     -----------------------------       PCT 0.25 - 0.50 ng/mL            PCT 0.10 - 0.25 ng/mL               OR       >80% decrease in PCT            Discourage initiation of                                            antibiotics      Encourage discontinuation           of antibiotics    ----------------------------     -----------------------------         PCT >= 0.50 ng/mL              PCT 0.26 - 0.50 ng/mL               AND        <80% decrease in PCT             Encourage initiation of                                              antibiotics  Encourage continuation           of antibiotics    ----------------------------     -----------------------------        PCT >= 0.50 ng/mL                  PCT > 0.50 ng/mL               AND         increase in PCT                  Strongly encourage                                      initiation of antibiotics    Strongly encourage escalation           of antibiotics                                     -----------------------------                                           PCT <= 0.25 ng/mL                                                 OR                                        > 80% decrease in PCT                                     Discontinue / Do not initiate                                             antibiotics Performed at [facility], [address] [place], [place] [address]   Protime-INR     Status: None   Collection Time: 12/15/17  7:28 AM  Result Value Ref Range   Prothrombin Time 13.6 11.4 - 15.2 seconds   INR 1.05     Comment: Performed at [facility], [address] [place], [place] [address]  Procalcitonin     Status: None   Collection Time: 12/15/17  7:28 AM  Result Value Ref Range   Procalcitonin 0.23 ng/mL    Comment:        Interpretation: PCT (Procalcitonin) <= 0.5 ng/mL: Systemic infection (sepsis) is not likely. Local bacterial infection is possible. (NOTE)       Sepsis PCT Algorithm           Lower Respiratory Tract                                      Infection PCT  Algorithm    ----------------------------     ----------------------------         PCT < 0.25 ng/mL                PCT < 0.10 ng/mL         Strongly encourage             Strongly discourage   discontinuation of antibiotics    initiation of antibiotics    ----------------------------     -----------------------------       PCT 0.25 - 0.50 ng/mL            PCT 0.10 - 0.25 ng/mL               OR        >80% decrease in PCT            Discourage initiation of                                            antibiotics      Encourage discontinuation           of antibiotics    ----------------------------     -----------------------------         PCT >= 0.50 ng/mL              PCT 0.26 - 0.50 ng/mL               AND        <80% decrease in PCT             Encourage initiation of                                             antibiotics       Encourage continuation           of antibiotics    ----------------------------     -----------------------------        PCT >= 0.50 ng/mL                  PCT > 0.50 ng/mL               AND         increase in PCT                  Strongly encourage                                      initiation of antibiotics    Strongly encourage escalation           of antibiotics                                     -----------------------------                                           PCT <= 0.25 ng/mL  OR                                        > 80% decrease in PCT                                     Discontinue / Do not initiate                                             antibiotics Performed at [facility], [address] [place], [place] [address]   Glucose, capillary     Status: Abnormal   Collection Time: 12/15/17  8:22 AM  Result Value Ref Range   Glucose-Capillary 212 (H) 65 - 99 mg/dL  Glucose, capillary     Status: Abnormal   Collection Time: 12/15/17 11:41 AM  Result Value Ref Range   Glucose-Capillary 232 (H) 65 - 99 mg/dL  Glucose, capillary     Status: Abnormal   Collection Time: 12/15/17  5:43 PM  Result Value Ref Range   Glucose-Capillary 237 (H) 65 - 99 mg/dL  Glucose, capillary     Status: Abnormal   Collection Time: 12/15/17  9:38 PM  Result Value Ref Range   Glucose-Capillary 247 (H) 65 - 99 mg/dL   Comment 1 Notify RN   Protime-INR     Status: Abnormal   Collection Time:  12/16/17  4:40 AM  Result Value Ref Range   Prothrombin Time 15.3 (H) 11.4 - 15.2 seconds   INR 1.22     Comment: Performed at [facility], [address] [place],  [address]  Procalcitonin     Status: None   Collection Time: 12/16/17  4:40 AM  Result Value Ref Range   Procalcitonin 0.36 ng/mL    Comment:        Interpretation: PCT (Procalcitonin) <= 0.5 ng/mL: Systemic infection (sepsis) is not likely. Local bacterial infection is possible. (NOTE)       Sepsis PCT Algorithm           Lower Respiratory Tract                                      Infection PCT Algorithm    ----------------------------     ----------------------------         PCT < 0.25 ng/mL                PCT < 0.10 ng/mL         Strongly encourage             Strongly discourage   discontinuation of antibiotics    initiation of antibiotics    ----------------------------     -----------------------------       PCT 0.25 - 0.50 ng/mL            PCT 0.10 - 0.25 ng/mL               OR       >80% decrease in PCT            Discourage initiation of  antibiotics      Encourage discontinuation           of antibiotics    ----------------------------     -----------------------------         PCT >= 0.50 ng/mL              PCT 0.26 - 0.50 ng/mL               AND        <80% decrease in PCT             Encourage initiation of                                             antibiotics       Encourage continuation           of antibiotics    ----------------------------     -----------------------------        PCT >= 0.50 ng/mL                  PCT > 0.50 ng/mL               AND         increase in PCT                  Strongly encourage                                      initiation of antibiotics    Strongly encourage escalation           of antibiotics                                     -----------------------------                                            PCT <= 0.25 ng/mL                                                 OR                                        > 80% decrease in PCT                                     Discontinue / Do not initiate                                             antibiotics Performed at [facility], [address], [place], [place] [address]   CBC with Differential/Platelet     Status: Abnormal   Collection Time: 12/16/17  4:40 AM  Result Value Ref Range   WBC 10.4 3.8 - 10.6 K/uL   RBC 2.80 (L) 4.40 - 5.90 MIL/uL   Hemoglobin 9.6 (L) 13.0 - 18.0 g/dL   HCT 27.5 (L) 40.0 - 52.0 %   MCV 98.5 80.0 - 100.0 fL   MCH 34.2 (H) 26.0 - 34.0 pg   MCHC 34.7 32.0 - 36.0 g/dL   RDW 13.4 11.5 - 14.5 %   Platelets 190 150 - 440 K/uL   Neutrophils Relative % 73 %   Neutro Abs 7.6 (H) 1.4 - 6.5 K/uL   Lymphocytes Relative 10 %   Lymphs Abs 1.1 1.0 - 3.6 K/uL   Monocytes Relative 13 %   Monocytes Absolute 1.3 (H) 0.2 - 1.0 K/uL   Eosinophils Relative 4 %   Eosinophils Absolute 0.4 0 - 0.7 K/uL   Basophils Relative 0 %   Basophils Absolute 0.0 0 - 0.1 K/uL    Comment: Performed at [facility], [address] [place], [place] [address]  Basic metabolic panel     Status: Abnormal   Collection Time: 12/16/17  4:40 AM  Result Value Ref Range   Sodium 132 (L) 135 - 145 mmol/L   Potassium 3.5 3.5 - 5.1 mmol/L   Chloride 96 (L) 101 - 111 mmol/L   CO2 28 22 - 32 mmol/L   Glucose, Bld 208 (H) 65 - 99 mg/dL   BUN 15 6 - 20 mg/dL   Creatinine, Ser 0.96 0.61 - 1.24 mg/dL   Calcium 8.0 (L) 8.9 - 10.3 mg/dL   GFR calc non Af Amer >60 >60 mL/min   GFR calc Af Amer >60 >60 mL/min    Comment: (NOTE) The eGFR has been calculated using the CKD EPI equation. This calculation has not been validated in all clinical situations. eGFR's persistently <60 mL/min signify possible Chronic Kidney Disease.    Anion gap 8 5 - 15    Comment: Performed at [facility], [address] [place], [place]  [address]  Glucose, capillary     Status: Abnormal   Collection Time: 12/16/17  7:45 AM  Result Value Ref Range   Glucose-Capillary 210 (H) 65 - 99 mg/dL   Comment 1 Notify RN   ECHOCARDIOGRAM COMPLETE     Status: None   Collection Time: 12/16/17  9:47 AM  Result Value Ref Range   Weight 3,326.3 oz   Height 68 in   BP 139/56 mmHg  Glucose, capillary     Status: Abnormal   Collection Time: 12/16/17 12:20 PM  Result Value Ref Range   Glucose-Capillary 225 (H) 65 - 99 mg/dL   Comment 1 Notify RN   Glucose, capillary     Status: Abnormal   Collection Time: 12/16/17  4:34 PM  Result Value Ref Range   Glucose-Capillary 194 (H) 65 - 99 mg/dL   Comment 1 Notify RN   Glucose, capillary     Status: Abnormal   Collection Time: 12/16/17  9:11 PM  Result Value Ref Range   Glucose-Capillary 182 (H) 65 - 99 mg/dL   Comment 1 Notify RN   Protime-INR     Status: Abnormal   Collection Time: 12/17/17  3:24 AM  Result Value Ref Range   Prothrombin Time 15.6 (H) 11.4 - 15.2 seconds   INR 1.25     Comment: Performed at [facility], [address] [place], [place] [address]  Glucose, capillary     Status: Abnormal  Collection Time: 12/17/17  7:35 AM  Result Value Ref Range   Glucose-Capillary 186 (H) 65 - 99 mg/dL   Comment 1 Notify RN   Glucose, capillary     Status: Abnormal   Collection Time: 12/17/17 11:46 AM  Result Value Ref Range   Glucose-Capillary 232 (H) 65 - 99 mg/dL   Comment 1 Notify RN   VITAMIN D 25 Hydroxy (Vit-D Deficiency, Fractures)     Status: None   Collection Time: 01/12/18  4:23 PM  Result Value Ref Range   Vit D, 25-Hydroxy 37.8 30.0 - 100.0 ng/mL    Comment: (NOTE) Vitamin D deficiency has been defined by the Institute of Medicine and an Endocrine Society practice guideline as a level of serum 25-OH vitamin D less than 20 ng/mL (1,2). The Endocrine Society went on to further define vitamin D insufficiency as a level between 21 and 29 ng/mL (2). 1. IOM  (Institute of Medicine). 2010. Dietary reference   intakes for calcium and D. [place]: The   [employer]. 2. Holick MF, Binkley [place], Bischoff-Ferrari HA, et al.   Evaluation, treatment, and prevention of vitamin D   deficiency: an Endocrine Society clinical practice   guideline. JCEM. 2011 Jul; 96(7):1911-30. Performed At: [facility] [place], [place] [id number] Rush Farmer MD [id number] Performed at [facility], [address] [place], [place] [address]   Hemoglobin A1c     Status: Abnormal   Collection Time: 01/12/18  4:23 PM  Result Value Ref Range   Hgb A1c MFr Bld 5.8 (H) 4.8 - 5.6 %    Comment: (NOTE) Pre diabetes:          5.7%-6.4% Diabetes:              >6.4% Glycemic control for   <7.0% adults with diabetes    Mean Plasma Glucose 119.76 mg/dL    Comment: Performed at [facility] [address], [place], [place] [address]  Protime-INR     Status: Abnormal   Collection Time: 01/12/18  4:23 PM  Result Value Ref Range   Prothrombin Time 18.3 (H) 11.4 - 15.2 seconds   INR 1.53     Comment: Performed at [facility], [address] [place], [place] [address]  CBC with Differential/Platelet     Status: Abnormal   Collection Time: 01/12/18  4:23 PM  Result Value Ref Range   WBC 7.1 3.8 - 10.6 K/uL   RBC 3.60 (L) 4.40 - 5.90 MIL/uL   Hemoglobin 12.3 (L) 13.0 - 18.0 g/dL   HCT 35.9 (L) 40.0 - 52.0 %   MCV 99.5 80.0 - 100.0 fL   MCH 34.3 (H) 26.0 - 34.0 pg   MCHC 34.4 32.0 - 36.0 g/dL   RDW 15.3 (H) 11.5 - 14.5 %   Platelets 274 150 - 440 K/uL   Neutrophils Relative % 70 %   Neutro Abs 5.0 1.4 - 6.5 K/uL   Lymphocytes Relative 15 %   Lymphs Abs 1.1 1.0 - 3.6 K/uL   Monocytes Relative 11 %   Monocytes Absolute 0.8 0.2 - 1.0 K/uL   Eosinophils Relative 4 %   Eosinophils Absolute 0.3 0 - 0.7 K/uL   Basophils Relative 0 %   Basophils Absolute 0.0 0 - 0.1 K/uL    Comment: Performed at  [facility], [address] [place], [place] [address]  Comprehensive metabolic panel     Status: Abnormal   Collection Time:  01/12/18  4:23 PM  Result Value Ref Range   Sodium 138 135 - 145 mmol/L   Potassium 3.4 (L) 3.5 - 5.1 mmol/L   Chloride 102 101 - 111 mmol/L   CO2 24 22 - 32 mmol/L   Glucose, Bld 191 (H) 65 - 99 mg/dL   BUN 11 6 - 20 mg/dL   Creatinine, Ser 0.91 0.61 - 1.24 mg/dL   Calcium 9.2 8.9 - 10.3 mg/dL   Total Protein 7.7 6.5 - 8.1 g/dL   Albumin 3.6 3.5 - 5.0 g/dL   AST 24 15 - 41 U/L   ALT 12 (L) 17 - 63 U/L   Alkaline Phosphatase 90 38 - 126 U/L   Total Bilirubin 0.7 0.3 - 1.2 mg/dL   GFR calc non Af Amer >60 >60 mL/min   GFR calc Af Amer >60 >60 mL/min    Comment: (NOTE) The eGFR has been calculated using the CKD EPI equation. This calculation has not been validated in all clinical situations. eGFR's persistently <60 mL/min signify possible Chronic Kidney Disease.    Anion gap 12 5 - 15    Comment: Performed at [facility], [address] [place], [place] [address]  Protime-INR     Status: Abnormal   Collection Time: 01/23/18 11:36 AM  Result Value Ref Range   Prothrombin Time 26.7 (H) 11.4 - 15.2 seconds   INR 2.49     Comment: Performed at [facility], [address] [place], [place] [address]  Culture, blood (single) w Reflex to ID Panel     Status: None   Collection Time: 01/23/18 11:36 AM  Result Value Ref Range   Specimen Description BLOOD RIGHT ANTECUBITAL    Special Requests      BOTTLES DRAWN AEROBIC AND ANAEROBIC Blood Culture adequate volume   Culture      NO GROWTH 5 DAYS Performed at [facility], [address] [place], [place] [address]    Report Status 01/28/2018 FINAL   Culture, blood (single) w Reflex to ID Panel     Status: None   Collection Time: 01/23/18 11:36 AM  Result Value Ref Range   Specimen Description BLOOD BLOOD RIGHT HAND    Special Requests      BOTTLES DRAWN AEROBIC  AND ANAEROBIC Blood Culture adequate volume   Culture      NO GROWTH 5 DAYS Performed at [facility], [address] [place], [place] [address]    Report Status 01/28/2018 FINAL   Urinalysis, Complete     Status: Abnormal   Collection Time: 01/31/18  1:21 PM  Result Value Ref Range   Specific Gravity, UA >1.030 (H) 1.005 - 1.030   pH, UA 5.0 5.0 - 7.5   Color, UA Yellow Yellow   Appearance Ur Cloudy (A) Clear   Leukocytes, UA Negative Negative   Protein, UA Trace (A) Negative/Trace   Glucose, UA Negative Negative   Ketones, UA Trace (A) Negative   RBC, UA Negative Negative   Bilirubin, UA Negative Negative   Urobilinogen, Ur 0.2 0.2 - 1.0 mg/dL   Nitrite, UA Negative Negative   Microscopic Examination See below:   Microscopic Examination     Status: Abnormal   Collection Time: 01/31/18  1:21 PM  Result Value Ref Range   WBC, UA None seen 0 - 5 /hpf   RBC, UA None seen 0 - 2 /hpf   Epithelial Cells (non renal) None seen 0 - 10 /hpf   Casts  Present (A) None seen /lpf   Cast Type Hyaline casts N/A   Crystals Present (A) N/A   Crystal Type Calcium Oxalate N/A   Mucus, UA Present (A) Not Estab.   Bacteria, UA Few (A) None seen/Few  Comprehensive metabolic panel     Status: Abnormal   Collection Time: 02/09/18  4:54 PM  Result Value Ref Range   Sodium 134 (L) 135 - 145 mmol/L   Potassium 4.0 3.5 - 5.1 mmol/L   Chloride 99 (L) 101 - 111 mmol/L   CO2 23 22 - 32 mmol/L   Glucose, Bld 141 (H) 65 - 99 mg/dL   BUN 17 6 - 20 mg/dL   Creatinine, Ser 0.86 0.61 - 1.24 mg/dL   Calcium 9.5 8.9 - 10.3 mg/dL   Total Protein 8.4 (H) 6.5 - 8.1 g/dL   Albumin 3.8 3.5 - 5.0 g/dL   AST 25 15 - 41 U/L   ALT 17 17 - 63 U/L   Alkaline Phosphatase 88 38 - 126 U/L   Total Bilirubin 0.7 0.3 - 1.2 mg/dL   GFR calc non Af Amer >60 >60 mL/min   GFR calc Af Amer >60 >60 mL/min    Comment: (NOTE) The eGFR has been calculated using the CKD EPI equation. This calculation has not been  validated in all clinical situations. eGFR's persistently <60 mL/min signify possible Chronic Kidney Disease.    Anion gap 12 5 - 15    Comment: Performed at [facility], [address] [place], [place] [address]  Lactic acid, plasma     Status: None   Collection Time: 02/09/18  4:54 PM  Result Value Ref Range   Lactic Acid, Venous 1.7 0.5 - 1.9 mmol/L    Comment: Performed at [facility], [address] [place], [place] [address]  CBC with Differential     Status: Abnormal   Collection Time: 02/09/18  4:54 PM  Result Value Ref Range   WBC 9.9 3.8 - 10.6 K/uL   RBC 3.90 (L) 4.40 - 5.90 MIL/uL   Hemoglobin 12.9 (L) 13.0 - 18.0 g/dL   HCT 37.4 (L) 40.0 - 52.0 %   MCV 95.9 80.0 - 100.0 fL   MCH 32.9 26.0 - 34.0 pg   MCHC 34.4 32.0 - 36.0 g/dL   RDW 15.0 (H) 11.5 - 14.5 %   Platelets 283 150 - 440 K/uL   Neutrophils Relative % 64 %   Neutro Abs 6.4 1.4 - 6.5 K/uL   Lymphocytes Relative 15 %   Lymphs Abs 1.5 1.0 - 3.6 K/uL   Monocytes Relative 12 %   Monocytes Absolute 1.2 (H) 0.2 - 1.0 K/uL   Eosinophils Relative 8 %   Eosinophils Absolute 0.8 (H) 0 - 0.7 K/uL   Basophils Relative 1 %   Basophils Absolute 0.1 0 - 0.1 K/uL    Comment: Performed at [facility], [address], [place], [place] [address]  Protime-INR     Status: Abnormal   Collection Time: 02/09/18  4:54 PM  Result Value Ref Range   Prothrombin Time 22.3 (H) 11.4 - 15.2 seconds   INR 1.98     Comment: Performed at [facility], [address] [place], [place] [address]  Culture, blood (Routine x 2)     Status: None   Collection Time: 02/09/18  4:54 PM  Result Value Ref Range   Specimen Description BLOOD LEFT ANTECUBITAL    Special Requests  BOTTLES DRAWN AEROBIC AND ANAEROBIC Blood Culture results may not be optimal due to an excessive volume of blood received in culture bottles   Culture      NO GROWTH 5 DAYS Performed at [facility], [address] [place], [place] [address]    Report Status 02/14/2018 FINAL   TSH     Status: Abnormal   Collection Time: 02/09/18  4:54 PM  Result Value Ref Range   TSH 5.099 (H) 0.350 - 4.500 uIU/mL    Comment: Performed by a 3rd Generation assay with a functional sensitivity of <=0.01 uIU/mL. Performed at [facility], [address] [place], [place] [address]   Glucose, capillary     Status: Abnormal   Collection Time: 02/09/18  7:59 PM  Result Value Ref Range   Glucose-Capillary 155 (H) 65 - 99 mg/dL  Blood gas, arterial     Status: Abnormal   Collection Time: 02/09/18  9:17 PM  Result Value Ref Range   FIO2 28.00    pH, Arterial 7.45 7.350 - 7.450   pCO2 arterial 37 32.0 - 48.0 mmHg   pO2, Arterial 74 (L) 83.0 - 108.0 mmHg   Bicarbonate 25.7 20.0 - 28.0 mmol/L   Acid-Base Excess 1.8 0.0 - 2.0 mmol/L   O2 Saturation 95.4 %   Patient temperature 37.0    Sample type ARTERIAL DRAW    Allens test (pass/fail) PASS PASS    Comment: Performed at [facility], [address] [place], [place] [address]  Glucose, capillary     Status: Abnormal   Collection Time: 02/09/18 11:41 PM  Result Value Ref Range   Glucose-Capillary 240 (H) 65 - 99 mg/dL  Urinalysis, Complete w Microscopic     Status: Abnormal   Collection Time: 02/10/18  3:42 AM  Result Value Ref Range   Color, Urine YELLOW (A) YELLOW   APPearance CLEAR (A) CLEAR   Specific Gravity, Urine 1.013 1.005 - 1.030   pH 5.0 5.0 - 8.0   Glucose, UA NEGATIVE NEGATIVE mg/dL   Hgb urine dipstick NEGATIVE NEGATIVE   Bilirubin Urine NEGATIVE NEGATIVE   Ketones, ur NEGATIVE NEGATIVE mg/dL   Protein, ur NEGATIVE NEGATIVE mg/dL   Nitrite NEGATIVE NEGATIVE   Leukocytes, UA NEGATIVE NEGATIVE   RBC / HPF 0-5 0 - 5 RBC/hpf   WBC, UA 0-5 0 - 5 WBC/hpf   Bacteria, UA RARE (A) NONE SEEN   Squamous Epithelial / LPF NONE SEEN 0 - 5    Comment: Performed at [facility], [address] [place], [place] [address]    Glucose, capillary     Status: Abnormal   Collection Time: 02/10/18  8:01 AM  Result Value Ref Range   Glucose-Capillary 197 (H) 65 - 99 mg/dL  Protime-INR     Status: Abnormal   Collection Time: 02/10/18 11:17 AM  Result Value Ref Range   Prothrombin Time 19.8 (H) 11.4 - 15.2 seconds   INR 1.70     Comment: Performed at [facility], [address] [place], [place] [address]  Glucose, capillary     Status: Abnormal   Collection Time: 02/10/18 12:18 PM  Result Value Ref Range   Glucose-Capillary 251 (H) 65 - 99 mg/dL  Glucose, capillary     Status: Abnormal   Collection Time: 02/10/18  5:40 PM  Result Value Ref Range   Glucose-Capillary 177 (H) 65 - 99 mg/dL  Glucose, capillary     Status: Abnormal   Collection  Time: 02/10/18  9:09 PM  Result Value Ref Range   Glucose-Capillary 163 (H) 65 - 99 mg/dL  Protime-INR     Status: Abnormal   Collection Time: 02/11/18  4:52 AM  Result Value Ref Range   Prothrombin Time 20.6 (H) 11.4 - 15.2 seconds   INR 1.79     Comment: Performed at [facility], [address] [place], [place] [address]  Glucose, capillary     Status: Abnormal   Collection Time: 02/11/18  7:30 AM  Result Value Ref Range   Glucose-Capillary 214 (H) 65 - 99 mg/dL  Glucose, capillary     Status: Abnormal   Collection Time: 02/11/18 12:05 PM  Result Value Ref Range   Glucose-Capillary 200 (H) 65 - 99 mg/dL  Glucose, capillary     Status: Abnormal   Collection Time: 02/11/18  4:58 PM  Result Value Ref Range   Glucose-Capillary 227 (H) 65 - 99 mg/dL  Protime-INR     Status: Abnormal   Collection Time: 02/16/18 11:31 AM  Result Value Ref Range   Prothrombin Time 22.1 (H) 11.4 - 15.2 seconds   INR 1.95     Comment: Performed at [facility], [address] [place], [place] [address]  Lipase, blood     Status: None   Collection Time: 02/18/18  1:48 PM  Result Value Ref Range   Lipase 20 11 - 51 U/L    Comment: Performed at  [facility], [address] [place], [place] [address]  Comprehensive metabolic panel     Status: Abnormal   Collection Time: 02/18/18  1:48 PM  Result Value Ref Range   Sodium 134 (L) 135 - 145 mmol/L   Potassium 4.7 3.5 - 5.1 mmol/L   Chloride 97 (L) 101 - 111 mmol/L   CO2 23 22 - 32 mmol/L   Glucose, Bld 307 (H) 65 - 99 mg/dL   BUN 14 6 - 20 mg/dL   Creatinine, Ser 0.95 0.61 - 1.24 mg/dL   Calcium 9.7 8.9 - 10.3 mg/dL   Total Protein 8.2 (H) 6.5 - 8.1 g/dL   Albumin 3.7 3.5 - 5.0 g/dL   AST 26 15 - 41 U/L   ALT 17 17 - 63 U/L   Alkaline Phosphatase 103 38 - 126 U/L   Total Bilirubin 0.5 0.3 - 1.2 mg/dL   GFR calc non Af Amer >60 >60 mL/min   GFR calc Af Amer >60 >60 mL/min    Comment: (NOTE) The eGFR has been calculated using the CKD EPI equation. This calculation has not been validated in all clinical situations. eGFR's persistently <60 mL/min signify possible Chronic Kidney Disease.    Anion gap 14 5 - 15    Comment: Performed at [facility], [address] [place], [place] [address]  CBC     Status: Abnormal   Collection Time: 02/18/18  1:48 PM  Result Value Ref Range   WBC 12.1 (H) 3.8 - 10.6 K/uL   RBC 3.79 (L) 4.40 - 5.90 MIL/uL   Hemoglobin 12.2 (L) 13.0 - 18.0 g/dL   HCT 36.1 (L) 40.0 - 52.0 %   MCV 95.2 80.0 - 100.0 fL   MCH 32.2 26.0 - 34.0 pg   MCHC 33.8 32.0 - 36.0 g/dL   RDW 15.1 (H) 11.5 - 14.5 %   Platelets 343 150 - 440 K/uL    Comment: Performed at [facility], [address] [place],  [address]  Urinalysis, Complete  w Microscopic     Status: Abnormal   Collection Time: 02/18/18  1:48 PM  Result Value Ref Range   Color, Urine YELLOW (A) YELLOW   APPearance CLEAR (A) CLEAR   Specific Gravity, Urine 1.017 1.005 - 1.030   pH 5.0 5.0 - 8.0   Glucose, UA >=500 (A) NEGATIVE mg/dL   Hgb urine dipstick NEGATIVE NEGATIVE   Bilirubin Urine NEGATIVE NEGATIVE   Ketones, ur NEGATIVE NEGATIVE mg/dL   Protein, ur  NEGATIVE NEGATIVE mg/dL   Nitrite NEGATIVE NEGATIVE   Leukocytes, UA NEGATIVE NEGATIVE   RBC / HPF 0-5 0 - 5 RBC/hpf   WBC, UA 0-5 0 - 5 WBC/hpf   Bacteria, UA NONE SEEN NONE SEEN   Squamous Epithelial / LPF NONE SEEN 0 - 5   Mucus PRESENT    Hyaline Casts, UA PRESENT    Ca Oxalate Crys, UA PRESENT     Comment: Performed at [facility], [address] [place], [place] [address]  Protime-INR     Status: Abnormal   Collection Time: 02/18/18  8:16 PM  Result Value Ref Range   Prothrombin Time 24.7 (H) 11.4 - 15.2 seconds   INR 2.25     Comment: Performed at [facility], [address] [place], [place] [address]  Comprehensive metabolic panel     Status: Abnormal   Collection Time: 02/19/18  9:25 PM  Result Value Ref Range   Sodium 133 (L) 135 - 145 mmol/L   Potassium 4.1 3.5 - 5.1 mmol/L   Chloride 97 (L) 101 - 111 mmol/L   CO2 23 22 - 32 mmol/L   Glucose, Bld 174 (H) 65 - 99 mg/dL   BUN 19 6 - 20 mg/dL   Creatinine, Ser 0.83 0.61 - 1.24 mg/dL   Calcium 9.0 8.9 - 10.3 mg/dL   Total Protein 8.0 6.5 - 8.1 g/dL   Albumin 3.6 3.5 - 5.0 g/dL   AST 28 15 - 41 U/L   ALT 15 (L) 17 - 63 U/L   Alkaline Phosphatase 100 38 - 126 U/L   Total Bilirubin 0.5 0.3 - 1.2 mg/dL   GFR calc non Af Amer >60 >60 mL/min   GFR calc Af Amer >60 >60 mL/min    Comment: (NOTE) The eGFR has been calculated using the CKD EPI equation. This calculation has not been validated in all clinical situations. eGFR's persistently <60 mL/min signify possible Chronic Kidney Disease.    Anion gap 13 5 - 15    Comment: Performed at [facility], [address] [place], [place] [address]  CBC     Status: Abnormal   Collection Time: 02/19/18  9:25 PM  Result Value Ref Range   WBC 11.7 (H) 3.8 - 10.6 K/uL   RBC 3.75 (L) 4.40 - 5.90 MIL/uL   Hemoglobin 12.2 (L) 13.0 - 18.0 g/dL   HCT 34.9 (L) 40.0 - 52.0 %   MCV 93.3 80.0 - 100.0 fL   MCH 32.6 26.0 - 34.0 pg   MCHC 35.0 32.0 -  36.0 g/dL   RDW 14.9 (H) 11.5 - 14.5 %   Platelets 311 150 - 440 K/uL    Comment: Performed at [facility], [address] [place],  [address]  Protime-INR     Status: Abnormal   Collection Time: 02/19/18  9:25 PM  Result Value Ref Range   Prothrombin Time 24.3 (H) 11.4 - 15.2 seconds   INR 2.21     Comment:  Performed at [facility], [address] [place], [place] [address]  Urinalysis, Complete w Microscopic     Status: Abnormal   Collection Time: 02/20/18 12:23 AM  Result Value Ref Range   Color, Urine YELLOW (A) YELLOW   APPearance CLEAR (A) CLEAR   Specific Gravity, Urine 1.028 1.005 - 1.030   pH 5.0 5.0 - 8.0   Glucose, UA NEGATIVE NEGATIVE mg/dL   Hgb urine dipstick NEGATIVE NEGATIVE   Bilirubin Urine NEGATIVE NEGATIVE   Ketones, ur NEGATIVE NEGATIVE mg/dL   Protein, ur NEGATIVE NEGATIVE mg/dL   Nitrite NEGATIVE NEGATIVE   Leukocytes, UA NEGATIVE NEGATIVE   RBC / HPF 0-5 0 - 5 RBC/hpf   WBC, UA 0-5 0 - 5 WBC/hpf   Bacteria, UA NONE SEEN NONE SEEN   Squamous Epithelial / LPF NONE SEEN 0 - 5   Mucus PRESENT     Comment: Performed at [facility], [address] [place], [place] [address]  Glucose, capillary     Status: Abnormal   Collection Time: 02/20/18 12:38 AM  Result Value Ref Range   Glucose-Capillary 189 (H) 65 - 99 mg/dL   Objective  Body mass index is 28.59 kg/m. Wt Readings from Last 3 Encounters:  02/19/18 188 lb (85.3 kg)  02/18/18 188 lb (85.3 kg)  02/16/18 184 lb 12.8 oz (83.8 kg)   Temp Readings from Last 3 Encounters:  02/21/18 98.2 F (36.8 C) (Oral)  02/19/18 99.1 F (37.3 C) (Oral)  02/18/18 98.5 F (36.9 C) (Oral)   BP Readings from Last 3 Encounters:  02/21/18 (!) 142/60  02/20/18 (!) 159/65  02/18/18 (!) 153/84   Pulse Readings from Last 3 Encounters:  02/21/18 69  02/20/18 68  02/18/18 63    Physical Exam  Constitutional: He is oriented to person, place, and time. Vital signs are  normal. He appears well-developed and well-nourished. He is cooperative.  HENT:  Head: Normocephalic and atraumatic.  Mouth/Throat: Oropharynx is clear and moist and mucous membranes are normal.  Eyes: Pupils are equal, round, and reactive to light. Conjunctivae are normal.  Cardiovascular: Normal rate, regular rhythm and normal heart sounds.  Pulmonary/Chest: Effort normal and breath sounds normal.  Abdominal: There is tenderness in the left lower quadrant.    Neurological: He is alert and oriented to person, place, and time. Gait normal.  Skin: Skin is warm, dry and intact.  Psychiatric: He has a normal mood and affect. His speech is normal and behavior is normal. Judgment and thought content normal. Cognition and memory are normal.  Nursing note and vitals reviewed.   Assessment   1. Left rectus sheath hematoma 8.9 x 4.5 cm repeat scan 7.9 x 4.5 cm  Plan  1. Stop coumadin was on 4 mg qd except 6 mg sat/sunday INR 2.21 on 02/19/18  Prn norco for pain  Refer to Dr. Bary Castilla surgical consultation  Has bled score 4 high risk will CC Dr. Melven Sartorius cards [facility] to advise stopped Coumadin no longer in Afib. He is still on Plavix  Provider: Dr. Olivia Mackie [facility]-Scocuzza-Internal Medicine

## 2018-02-22 DIAGNOSIS — Z471 Aftercare following joint replacement surgery: Secondary | ICD-10-CM | POA: Diagnosis not present

## 2018-02-22 DIAGNOSIS — I129 Hypertensive chronic kidney disease with stage 1 through stage 4 chronic kidney disease, or unspecified chronic kidney disease: Secondary | ICD-10-CM | POA: Diagnosis not present

## 2018-02-22 DIAGNOSIS — N182 Chronic kidney disease, stage 2 (mild): Secondary | ICD-10-CM | POA: Diagnosis not present

## 2018-02-22 DIAGNOSIS — E1140 Type 2 diabetes mellitus with diabetic neuropathy, unspecified: Secondary | ICD-10-CM | POA: Diagnosis not present

## 2018-02-22 DIAGNOSIS — I480 Paroxysmal atrial fibrillation: Secondary | ICD-10-CM | POA: Diagnosis not present

## 2018-02-22 DIAGNOSIS — E1122 Type 2 diabetes mellitus with diabetic chronic kidney disease: Secondary | ICD-10-CM | POA: Diagnosis not present

## 2018-02-22 NOTE — Progress Notes (Signed)
Note has been faxed electronically

## 2018-02-23 ENCOUNTER — Encounter: Payer: Self-pay | Admitting: General Surgery

## 2018-02-23 ENCOUNTER — Ambulatory Visit (INDEPENDENT_AMBULATORY_CARE_PROVIDER_SITE_OTHER): Payer: Medicare Other | Admitting: General Surgery

## 2018-02-23 VITALS — BP 122/64 | HR 71 | Resp 14 | Ht 68.0 in | Wt 188.0 lb

## 2018-02-23 DIAGNOSIS — E538 Deficiency of other specified B group vitamins: Secondary | ICD-10-CM | POA: Diagnosis not present

## 2018-02-23 DIAGNOSIS — I480 Paroxysmal atrial fibrillation: Secondary | ICD-10-CM | POA: Diagnosis not present

## 2018-02-23 DIAGNOSIS — G4733 Obstructive sleep apnea (adult) (pediatric): Secondary | ICD-10-CM | POA: Diagnosis not present

## 2018-02-23 DIAGNOSIS — Z8669 Personal history of other diseases of the nervous system and sense organs: Secondary | ICD-10-CM | POA: Diagnosis not present

## 2018-02-23 DIAGNOSIS — I639 Cerebral infarction, unspecified: Secondary | ICD-10-CM

## 2018-02-23 DIAGNOSIS — S301XXA Contusion of abdominal wall, initial encounter: Secondary | ICD-10-CM

## 2018-02-23 DIAGNOSIS — E519 Thiamine deficiency, unspecified: Secondary | ICD-10-CM | POA: Diagnosis not present

## 2018-02-23 DIAGNOSIS — R410 Disorientation, unspecified: Secondary | ICD-10-CM | POA: Diagnosis not present

## 2018-02-23 DIAGNOSIS — E1140 Type 2 diabetes mellitus with diabetic neuropathy, unspecified: Secondary | ICD-10-CM | POA: Diagnosis not present

## 2018-02-23 NOTE — Progress Notes (Signed)
Patient ID: Chase Cabrera, male   DOB: [birth year], 72 y.o.   MRN: [id number]  Chief Complaint  Patient presents with  . Other    rectus shealth hematoma    HPI Chase Cabrera is a 72 y.o. male.  Here for evaluation of a left rectus sheath hematoma referred by Dr Terese Door. He was seen in the ED on 6-8 -19 and went back for increased pain on 02-19-18 left lower abdominal pain. The pain started on 02-18-18 and was gradual that day and then it became severe. He was on coumadin for A Fib and this was stopped on Tuesday, he is still on Plavix. CT was done 02-18-18 and 02-19-18. Denies nausea or vomiting. He states the pain has improved some but is still tender.  States the diarrhea is chronic for 2 years. He is here with his wife, Chase Cabrera.  HPI  Past Medical History:  Diagnosis Date  . Arthritis   . Atrial fibrillation ([place])   . CAD (coronary artery disease)    6 STENTS  . CHF (congestive heart failure) ([place])   . Chicken pox   . Colon polyp   . Corneal dystrophy    bilateral  . Crohn's disease ([place])   . Diabetes ([place]) 2002  . Dysrhythmia    A-FIB AND PAF  . GERD (gastroesophageal reflux disease)   . Gout   . History of kidney stones   . History of shingles   . Hyperlipemia   . Hypertension   . Kidney stones   . Myocardial infarction ([place])    x4, last one in 2010  . Peripheral neuropathy   . Peripheral neuropathy   . Peripheral neuropathy   . PUD (peptic ulcer disease)   . Rectus sheath hematoma    02/18/18 8.9 x 4.5 cm then 02/19/18 7.9 x 4.5 cm   . Renal artery stenosis ([place])   . Renal artery stenosis ([place])   . Salzmann's nodular dystrophy 2012  . Sleep apnea   . Spinal stenosis     Past Surgical History:  Procedure Laterality Date  . ABLATION  2012  . APPLICATION VERTERBRAL DEFECT PROSTHETIC  05/01/2013  . ARTHRODESIS ANTERIOR LUMBAR SPINE  05/01/2013  . BACK SURGERY     lumbar fusion  . CARDIAC CATHETERIZATION    . CARDIAC ELECTROPHYSIOLOGY STUDY AND ABLATION     . CARDIAC SURGERY    . CARDIOVERSION    . CHOLECYSTECTOMY    . COLONOSCOPY WITH PROPOFOL N/A 04/09/2016   Completed for chronic diarrhea.  Few scattered diverticuli.  No mucosal lesions.  Surgeon: Lollie Sails, MD;  Location[facility] ENDOSCOPY;  Service: Endoscopy;  Laterality: N/A;  . CORNEAL EYE SURGERY Bilateral 07/28/11    09/22/2011  . CORONARY ANGIOPLASTY    . CORONARY ANGIOPLASTY WITH STENT PLACEMENT  03/28/2015   Distal 80% to normal Stent, Dilation Balloon  . CORONARY ARTERY BYPASS GRAFT     4 VESSELS  . EYE SURGERY     bilateral cataract  . foot and ankle repair Right 2005  . FRACTURE SURGERY     ANKLE PLATE AND SCREWS  . GALLBLADER    . JOINT REPLACEMENT     left total hip 11/11/15  . JOINT REPLACEMENT     right total hip 12/2017 Dr. Rudene Christians   . KNEE ARTHROSCOPY Right   . LUMBAR SPINE FUSION ONE LEVEL  05/03/2013  . RENAL ARTERY STENT Right   . ROTATOR CUFF REPAIR Right 2006  . TONSILLECTOMY    .  TOTAL HIP ARTHROPLASTY Left 11/11/2015   Procedure: TOTAL HIP ARTHROPLASTY ANTERIOR APPROACH;  Surgeon: Hessie Knows, MD;  Location: [facility] ORS;  Service: Orthopedics;  Laterality: Left;  . TOTAL HIP ARTHROPLASTY Right 12/13/2017   Procedure: TOTAL HIP ARTHROPLASTY ANTERIOR APPROACH;  Surgeon: Hessie Knows, MD;  Location: [facility] ORS;  Service: Orthopedics;  Laterality: Right;  . TRANSCATH PLACEMENT INTRAVASCULAR STENT LEG  03/2015    Family History  Problem Relation Age of Onset  . CVA Father   . Stroke Father   . Lung cancer Mother   . Arthritis Mother   . Stomach cancer Sister   . Colon cancer Sister   . Diabetes Brother     Social History Social History   Tobacco Use  . Smoking status: Former Smoker    Packs/day: 1.00    Years: 3.00    Pack years: 3.00    Types: Cigarettes    Last attempt to quit: 06/30/1962    Years since quitting: 55.6  . Smokeless tobacco: Former Systems developer    Types: Chew    Quit date: 12/05/1968  Substance Use Topics  . Alcohol use: No  . Drug use:  No    Allergies  Allergen Reactions  . Allopurinol Diarrhea, Other (See Comments) and Nausea And Vomiting    Other Reaction: GI Upset  . Atenolol Other (See Comments)    Other Reaction: bradycardia  . Atorvastatin Other (See Comments)    Other Reaction: muscle aches  . Ramipril Rash  . Rosuvastatin Other (See Comments)    Muscle aches  . Simvastatin Other (See Comments)    Other Reaction: MUSCLE ACHES (Zocor)  . Valsartan Other (See Comments)    Other Reaction: facial swelling  . Cardizem [Diltiazem] Rash    Current Outpatient Medications  Medication Sig Dispense Refill  . amLODipine (NORVASC) 10 MG tablet Take 1 tablet (10 mg total) by mouth daily. 30 tablet 0  . carvedilol (COREG) 25 MG tablet Take 1 tablet (25 mg total) by mouth 2 (two) times daily with a meal. 180 tablet 1  . Cholecalciferol 50000 units TABS Take 1 tablet by mouth once a week. (Patient taking differently: Take 50,000 Units by mouth every Saturday. ) 13 tablet 1  . cholestyramine light (PREVALITE) 4 g packet Take 4 g by mouth every morning.     . clopidogrel (PLAVIX) 75 MG tablet Take 75 mg by mouth daily.    . colchicine 0.6 MG tablet TAKE 1 TABLET BY MOUTH ONCE DAILY (Patient taking differently: TAKE 0.6 MG BY MOUTH ONCE DAILY) 60 tablet 0  . diclofenac sodium (VOLTAREN) 1 % GEL Apply 2 g topically 4 (four) times daily as needed (for pain).     Marland Kitchen dicyclomine (BENTYL) 10 MG capsule Take 10 mg by mouth 3 (three) times daily before meals.     . fluticasone (FLONASE) 50 MCG/ACT nasal spray Place 2 sprays into both nostrils daily. 8 g 0  . gabapentin (NEURONTIN) 300 MG capsule take 2 capsules (620m) by mouth three times a day 180 capsule 1  . guaiFENesin (MUCINEX) 600 MG 12 hr tablet Take 1 tablet (600 mg total) by mouth 2 (two) times daily. 30 tablet 0  . hydrALAZINE (APRESOLINE) 100 MG tablet TAKE 100 MG BY MOUTH THREE TIMES A DAY 270 tablet 1  . HYDROcodone-acetaminophen (NORCO) 5-325 MG tablet Take 1 tablet by  mouth every 4 (four) hours as needed for severe pain (try not to take more than 2 a day.). 6 tablet 0  .  insulin lispro (HUMALOG) 100 UNIT/ML KiwkPen Inject 14-32u under the skin three times daily before meals according to sliding scale    . ipratropium (ATROVENT) 0.06 % nasal spray Place 2 sprays into both nostrils 4 (four) times daily. 15 mL 12  . isosorbide mononitrate (IMDUR) 120 MG 24 hr tablet TAKE 1 TABLET BY MOUTH EVERY DAY 90 tablet 1  . LEVEMIR FLEXTOUCH 100 UNIT/ML Pen Inject 80 Units into the skin daily at 10 pm. 15 mL 1  . levocetirizine (XYZAL) 5 MG tablet Take 1 tablet (5 mg total) by mouth every evening.    . metFORMIN (GLUCOPHAGE) 1000 MG tablet Take 1,000 mg by mouth 2 (two) times daily with a meal.    . montelukast (SINGULAIR) 10 MG tablet Take 1 tablet (10 mg total) by mouth at bedtime. 90 tablet 0  . Multiple Vitamin (MULTI-VITAMINS) TABS Take 1 tablet by mouth daily.    . nitroGLYCERIN (NITROSTAT) 0.4 MG SL tablet Place 1 tablet (0.4 mg total) under the tongue every 5 (five) minutes x 3 doses as needed. For chest pain.  Call 911 if no relief after 3 tablets 30 tablet 11  . omeprazole (PRILOSEC) 40 MG capsule Take 40 mg by mouth 2 (two) times daily.     . pravastatin (PRAVACHOL) 40 MG tablet TAKE 1 TABLET BY MOUTH ONCE DAILY 90 tablet 0  . probenecid (BENEMID) 500 MG tablet Take 500 mg by mouth 2 (two) times daily.   0  . silodosin (RAPAFLO) 8 MG CAPS capsule Take 1 capsule (8 mg total) by mouth daily with breakfast. 30 capsule 0  . vitamin C (ASCORBIC ACID) 500 MG tablet Take 500 mg by mouth daily.     . Vitamin E 400 units TABS Take 400 Units by mouth 2 (two) times daily.     No current facility-administered medications for this visit.     Review of Systems Review of Systems  Constitutional: Negative.   Respiratory: Negative.   Cardiovascular: Negative.   Gastrointestinal: Positive for abdominal pain and diarrhea. Negative for nausea and vomiting.    Blood pressure  122/64, pulse 71, resp. rate 14, height 5' 8"  (1.727 m), weight 188 lb (85.3 kg), SpO2 96 %.  Physical Exam Physical Exam  Constitutional: He is oriented to person, place, and time. He appears well-developed and well-nourished.  Abdominal: Soft. Normal appearance.    Neurological: He is alert and oriented to person, place, and time.  Skin: Skin is warm and dry.  Psychiatric: His behavior is normal.    Data Reviewed CT scans of the abdomen completed June 8 and February 19, 2018 for evaluation of his left lower quadrant pain reviewed.  Left rectus sheath hematoma.  Of note, previously identified nodule medial to the descending colon is unchanged in size.  PT/INR at time of presentation was 1.95.   Assessment    Rectus hematoma likely secondary to anticoagulation.  (Plavix/Coumadin) Plan    The patient is aware to use a heating pad as needed for comfort.  Follow up as scheduled in September.     HPI, Physical Exam, Assessment and Plan have been scribed under the direction and in the presence of Robert Bellow, MD. Karie Fetch, RN  I have completed the exam and reviewed the above documentation for accuracy and completeness.  I agree with the above.  Haematologist has been used and any errors in dictation or transcription are unintentional.  Hervey Ard, M.D., F.A.C.S.  Forest Gleason Lumi Winslett 02/25/2018, 1:07 PM

## 2018-02-23 NOTE — Patient Instructions (Addendum)
The patient is aware to call back for any questions or concerns. The patient is aware to use a heating pad as needed for comfort.  Follow up as scheduled in September.

## 2018-02-24 DIAGNOSIS — E538 Deficiency of other specified B group vitamins: Secondary | ICD-10-CM | POA: Diagnosis not present

## 2018-02-27 ENCOUNTER — Ambulatory Visit: Payer: Medicare Other | Admitting: Internal Medicine

## 2018-03-01 ENCOUNTER — Ambulatory Visit (INDEPENDENT_AMBULATORY_CARE_PROVIDER_SITE_OTHER): Payer: Medicare Other | Admitting: Urology

## 2018-03-01 ENCOUNTER — Other Ambulatory Visit: Payer: Self-pay | Admitting: Family Medicine

## 2018-03-01 ENCOUNTER — Telehealth: Payer: Self-pay

## 2018-03-01 ENCOUNTER — Encounter: Payer: Self-pay | Admitting: Urology

## 2018-03-01 VITALS — BP 103/45 | HR 58 | Ht 68.0 in | Wt 188.2 lb

## 2018-03-01 DIAGNOSIS — R197 Diarrhea, unspecified: Secondary | ICD-10-CM | POA: Diagnosis not present

## 2018-03-01 DIAGNOSIS — I482 Chronic atrial fibrillation: Secondary | ICD-10-CM | POA: Diagnosis not present

## 2018-03-01 DIAGNOSIS — Z7901 Long term (current) use of anticoagulants: Secondary | ICD-10-CM | POA: Diagnosis not present

## 2018-03-01 DIAGNOSIS — Z96641 Presence of right artificial hip joint: Secondary | ICD-10-CM | POA: Diagnosis not present

## 2018-03-01 DIAGNOSIS — R933 Abnormal findings on diagnostic imaging of other parts of digestive tract: Secondary | ICD-10-CM | POA: Diagnosis not present

## 2018-03-01 DIAGNOSIS — N3941 Urge incontinence: Secondary | ICD-10-CM

## 2018-03-01 MED ORDER — SILODOSIN 8 MG PO CAPS: 8.0000 mg | ORAL_CAPSULE | Freq: Every day | ORAL | 1 refills | Status: DC

## 2018-03-01 NOTE — Telephone Encounter (Signed)
Thank you advise [facility] for now can hold phone calls due to pt is not on Coumadin  Find MD INR form call 1800 # and advise please  Otherwise they will keep calling    Thanks [place]

## 2018-03-01 NOTE — Progress Notes (Signed)
03/01/2018 2:56 PM   Chase Cabrera May 01, 1946 [id number]  Referring provider: McLean-Scocuzza, Nino Glow, MD [place], [place] [address]  Chief Complaint  Patient presents with  . Medication Management    HPI: Refer to my previous cystoscopy note of 01/31/2018.  He has seen no significant change in his voiding pattern on silodosin.   PMH: Past Medical History:  Diagnosis Date  . Arthritis   . Atrial fibrillation ([place])   . CAD (coronary artery disease)    6 STENTS  . CHF (congestive heart failure) ([place])   . Chicken pox   . Colon polyp   . Corneal dystrophy    bilateral  . Crohn's disease ([place])   . Diabetes ([place]) 2002  . Dysrhythmia    A-FIB AND PAF  . GERD (gastroesophageal reflux disease)   . Gout   . History of kidney stones   . History of shingles   . Hyperlipemia   . Hypertension   . Kidney stones   . Myocardial infarction ([place])    x4, last one in 2010  . Peripheral neuropathy   . Peripheral neuropathy   . Peripheral neuropathy   . PUD (peptic ulcer disease)   . Rectus sheath hematoma    02/18/18 8.9 x 4.5 cm then 02/19/18 7.9 x 4.5 cm   . Renal artery stenosis ([place])   . Renal artery stenosis ([place])   . Salzmann's nodular dystrophy 2012  . Sleep apnea   . Spinal stenosis     Surgical History: Past Surgical History:  Procedure Laterality Date  . ABLATION  2012  . APPLICATION VERTERBRAL DEFECT PROSTHETIC  05/01/2013  . ARTHRODESIS ANTERIOR LUMBAR SPINE  05/01/2013  . BACK SURGERY     lumbar fusion  . CARDIAC CATHETERIZATION    . CARDIAC ELECTROPHYSIOLOGY STUDY AND ABLATION    . CARDIAC SURGERY    . CARDIOVERSION    . CHOLECYSTECTOMY    . COLONOSCOPY WITH PROPOFOL N/A 04/09/2016   Completed for chronic diarrhea.  Few scattered diverticuli.  No mucosal lesions.  Surgeon: Lollie Sails, MD;  Location[facility] END[facility]COPY;  Service: Endoscopy;  Laterality: N/A;  . CORNEAL EYE SURGERY Bilateral 07/28/11    09/22/2011  . CORONARY ANGIOPLASTY     . CORONARY ANGIOPLASTY WITH STENT PLACEMENT  03/28/2015   Distal 80% to normal Stent, Dilation Balloon  . CORONARY ARTERY BYPASS GRAFT     4 VESSELS  . EYE SURGERY     bilateral cataract  . foot and ankle repair Right 2005  . FRACTURE SURGERY     ANKLE PLATE AND SCREWS  . GALLBLADER    . JOINT REPLACEMENT     left total hip 11/11/15  . JOINT REPLACEMENT     right total hip 12/2017 Dr. Rudene Christians   . KNEE ARTHROSCOPY Right   . LUMBAR SPINE FUSION ONE LEVEL  05/03/2013  . RENAL ARTERY STENT Right   . ROTATOR CUFF REPAIR Right 2006  . TONSILLECTOMY    . TOTAL HIP ARTHROPLASTY Left 11/11/2015   Procedure: TOTAL HIP ARTHROPLASTY ANTERIOR APPROACH;  Surgeon: Hessie Knows, MD;  Location: [facility] ORS;  Service: Orthopedics;  Laterality: Left;  . TOTAL HIP ARTHROPLASTY Right 12/13/2017   Procedure: TOTAL HIP ARTHROPLASTY ANTERIOR APPROACH;  Surgeon: Hessie Knows, MD;  Location: [facility] ORS;  Service: Orthopedics;  Laterality: Right;  . TRANSCATH PLACEMENT INTRAVASCULAR STENT LEG  03/2015    Home Medications:  Allergies as of 03/01/2018      Reactions  Allopurinol Diarrhea, Other (See Comments), Nausea And Vomiting   Other Reaction: GI Upset   Atenolol Other (See Comments)   Other Reaction: bradycardia   Atorvastatin Other (See Comments)   Other Reaction: muscle aches   Ramipril Rash   Rosuvastatin Other (See Comments)   Muscle aches   Simvastatin Other (See Comments)   Other Reaction: MUSCLE ACHES (Zocor)   Valsartan Other (See Comments)   Other Reaction: facial swelling   Cardizem [diltiazem] Rash      Medication List        Accurate as of 03/01/18  2:56 PM. Always use your most recent med list.          amLODipine 10 MG tablet Commonly known as:  NORVASC Take 1 tablet (10 mg total) by mouth daily.   BENTYL 10 MG capsule Generic drug:  dicyclomine Take 10 mg by mouth 3 (three) times daily before meals.   carvedilol 25 MG tablet Commonly known as:  COREG Take 1 tablet (25 mg  total) by mouth 2 (two) times daily with a meal.   Cholecalciferol 50000 units Tabs Take 1 tablet by mouth once a week.   cholestyramine light 4 g packet Commonly known as:  PREVALITE Take 4 g by mouth every morning.   clopidogrel 75 MG tablet Commonly known as:  PLAVIX Take 75 mg by mouth daily.   colchicine 0.6 MG tablet TAKE 1 TABLET BY MOUTH ONCE DAILY   diclofenac sodium 1 % Gel Commonly known as:  VOLTAREN Apply 2 g topically 4 (four) times daily as needed (for pain).   fluticasone 50 MCG/ACT nasal spray Commonly known as:  FLONASE Place 2 sprays into both nostrils daily.   gabapentin 300 MG capsule Commonly known as:  NEURONTIN take 2 capsules (659m) by mouth three times a day   guaiFENesin 600 MG 12 hr tablet Commonly known as:  MUCINEX Take 1 tablet (600 mg total) by mouth 2 (two) times daily.   hydrALAZINE 100 MG tablet Commonly known as:  APRESOLINE TAKE 100 MG BY MOUTH THREE TIMES A DAY   HYDROcodone-acetaminophen 5-325 MG tablet Commonly known as:  NORCO Take 1 tablet by mouth every 4 (four) hours as needed for severe pain (try not to take more than 2 a day.).   insulin lispro 100 UNIT/ML KiwkPen Commonly known as:  HUMALOG Inject 14-32u under the skin three times daily before meals according to sliding scale   ipratropium 0.06 % nasal spray Commonly known as:  ATROVENT Place 2 sprays into both nostrils 4 (four) times daily.   isosorbide mononitrate 120 MG 24 hr tablet Commonly known as:  IMDUR TAKE 1 TABLET BY MOUTH EVERY DAY   LEVEMIR FLEXTOUCH 100 UNIT/ML Pen Generic drug:  Insulin Detemir Inject 80 Units into the skin daily at 10 pm.   levocetirizine 5 MG tablet Commonly known as:  XYZAL Take 1 tablet (5 mg total) by mouth every evening.   metFORMIN 1000 MG tablet Commonly known as:  GLUCOPHAGE Take 1,000 mg by mouth 2 (two) times daily with a meal.   montelukast 10 MG tablet Commonly known as:  SINGULAIR Take 1 tablet (10 mg total)  by mouth at bedtime.   MULTI-VITAMINS Tabs Take 1 tablet by mouth daily.   nitroGLYCERIN 0.4 MG SL tablet Commonly known as:  NITROSTAT Place 1 tablet (0.4 mg total) under the tongue every 5 (five) minutes x 3 doses as needed. For chest pain.  Call 911 if no relief after 3 tablets  omeprazole 40 MG capsule Commonly known as:  PRILOSEC Take 40 mg by mouth 2 (two) times daily.   pravastatin 40 MG tablet Commonly known as:  PRAVACHOL TAKE 1 TABLET BY MOUTH ONCE DAILY   probenecid 500 MG tablet Commonly known as:  BENEMID Take 500 mg by mouth 2 (two) times daily.   silodosin 8 MG Caps capsule Commonly known as:  RAPAFLO Take 1 capsule (8 mg total) by mouth daily with breakfast.   vitamin C 500 MG tablet Commonly known as:  ASCORBIC ACID Take 500 mg by mouth daily.   Vitamin E 400 units Tabs Take 400 Units by mouth 2 (two) times daily.       Allergies:  Allergies  Allergen Reactions  . Allopurinol Diarrhea, Other (See Comments) and Nausea And Vomiting    Other Reaction: GI Upset  . Atenolol Other (See Comments)    Other Reaction: bradycardia  . Atorvastatin Other (See Comments)    Other Reaction: muscle aches  . Ramipril Rash  . Rosuvastatin Other (See Comments)    Muscle aches  . Simvastatin Other (See Comments)    Other Reaction: MUSCLE ACHES (Zocor)  . Valsartan Other (See Comments)    Other Reaction: facial swelling  . Cardizem [Diltiazem] Rash    Family History: Family History  Problem Relation Age of Onset  . CVA Father   . Stroke Father   . Lung cancer Mother   . Arthritis Mother   . Stomach cancer Sister   . Colon cancer Sister   . Diabetes Brother     Social History:  reports that he quit smoking about 55 years ago. His smoking use included cigarettes. He has a 3.00 pack-year smoking history. He quit smokeless tobacco use about 49 years ago. His smokeless tobacco use included chew. He reports that he does not drink alcohol or use  drugs.  ROS: UROLOGY Frequent Urination?: Yes Hard to postpone urination?: Yes Burning/pain with urination?: No Get up at night to urinate?: Yes Leakage of urine?: Yes Urine stream starts and stops?: No Trouble starting stream?: No Do you have to strain to urinate?: No Blood in urine?: No Urinary tract infection?: No Sexually transmitted disease?: No Injury to kidneys or bladder?: No Painful intercourse?: No Weak stream?: No Erection problems?: Yes Penile pain?: No  Gastrointestinal Nausea?: No Vomiting?: No Indigestion/heartburn?: No Diarrhea?: Yes Constipation?: No  Constitutional Fever: No Night sweats?: Yes Weight loss?: No Fatigue?: No  Skin Skin rash/lesions?: No Itching?: No  Eyes Blurred vision?: No Double vision?: No  Ears/Nose/Throat Sore throat?: No Sinus problems?: Yes  Hematologic/Lymphatic Swollen glands?: No Easy bruising?: Yes  Cardiovascular Leg swelling?: No Chest pain?: No  Respiratory Cough?: No Shortness of breath?: Yes  Endocrine Excessive thirst?: No  Musculoskeletal Back pain?: No Joint pain?: Yes  Neurological Headaches?: No Dizziness?: No  Psychologic Depression?: No Anxiety?: No  Physical Exam: BP (!) 103/45 (BP Location: Right Arm, Patient Position: Sitting, Cuff Size: Large)   Pulse (!) 58   Ht 5' 8"  (1.727 m)   Wt 188 lb 3.2 oz (85.4 kg)   SpO2 99%   BMI 28.62 kg/m   Constitutional:  Alert and oriented, No acute distress. HEENT: [place] AT, moist mucus membranes.  Trachea midline, no masses. Cardiovascular: No clubbing, cyanosis, or edema. Respiratory: Normal respiratory effort, no increased work of breathing. GI: Abdomen is soft, nontender, nondistended, no abdominal masses GU: No CVA tenderness Lymph: No cervical or inguinal lymphadenopathy. Skin: No rashes, bruises or suspicious lesions. Neurologic:  Grossly intact, no focal deficits, moving all 4 extremities. Psychiatric: Normal mood and  affect.   Assessment & Plan:   72 year old male with a bothersome lower urinary tract symptoms refractory to multiple medications.  His most bothersome symptom is urge incontinence.  Recommend scheduling urodynamic study with follow-up.    Abbie Sons, [place] [address] [place], [place] [address] [phone]

## 2018-03-01 NOTE — Telephone Encounter (Signed)
Copied from [place] [id number]. Topic: General - Other >> Mar 01, 2018 12:18 PM Carolyn Stare wrote:  Joellen Jersey with [facility] call to report an out of range 1.1   [phone]

## 2018-03-01 NOTE — Telephone Encounter (Signed)
Please call.

## 2018-03-01 NOTE — Telephone Encounter (Signed)
They stated they will call regardless due to their policy. Since it is technically out of range.

## 2018-03-02 DIAGNOSIS — R410 Disorientation, unspecified: Secondary | ICD-10-CM | POA: Diagnosis not present

## 2018-03-03 DIAGNOSIS — E1122 Type 2 diabetes mellitus with diabetic chronic kidney disease: Secondary | ICD-10-CM | POA: Diagnosis not present

## 2018-03-03 DIAGNOSIS — N182 Chronic kidney disease, stage 2 (mild): Secondary | ICD-10-CM | POA: Diagnosis not present

## 2018-03-03 DIAGNOSIS — E1140 Type 2 diabetes mellitus with diabetic neuropathy, unspecified: Secondary | ICD-10-CM | POA: Diagnosis not present

## 2018-03-03 DIAGNOSIS — I480 Paroxysmal atrial fibrillation: Secondary | ICD-10-CM | POA: Diagnosis not present

## 2018-03-03 DIAGNOSIS — Z471 Aftercare following joint replacement surgery: Secondary | ICD-10-CM | POA: Diagnosis not present

## 2018-03-03 DIAGNOSIS — I129 Hypertensive chronic kidney disease with stage 1 through stage 4 chronic kidney disease, or unspecified chronic kidney disease: Secondary | ICD-10-CM | POA: Diagnosis not present

## 2018-03-07 ENCOUNTER — Other Ambulatory Visit: Payer: Self-pay

## 2018-03-07 ENCOUNTER — Telehealth: Payer: Self-pay | Admitting: Internal Medicine

## 2018-03-07 DIAGNOSIS — E1140 Type 2 diabetes mellitus with diabetic neuropathy, unspecified: Secondary | ICD-10-CM | POA: Diagnosis not present

## 2018-03-07 DIAGNOSIS — E1122 Type 2 diabetes mellitus with diabetic chronic kidney disease: Secondary | ICD-10-CM | POA: Diagnosis not present

## 2018-03-07 DIAGNOSIS — I129 Hypertensive chronic kidney disease with stage 1 through stage 4 chronic kidney disease, or unspecified chronic kidney disease: Secondary | ICD-10-CM | POA: Diagnosis not present

## 2018-03-07 DIAGNOSIS — I480 Paroxysmal atrial fibrillation: Secondary | ICD-10-CM | POA: Diagnosis not present

## 2018-03-07 DIAGNOSIS — Z471 Aftercare following joint replacement surgery: Secondary | ICD-10-CM | POA: Diagnosis not present

## 2018-03-07 DIAGNOSIS — N182 Chronic kidney disease, stage 2 (mild): Secondary | ICD-10-CM | POA: Diagnosis not present

## 2018-03-07 MED ORDER — PRAVASTATIN SODIUM 40 MG PO TABS: 40.0000 mg | ORAL_TABLET | Freq: Every day | ORAL | 0 refills | Status: DC

## 2018-03-07 NOTE — Telephone Encounter (Signed)
Copied from [place] [id number]. Topic: Quick Communication - Rx Refill/Question >> Mar 07, 2018 11:04 AM Lennox Solders wrote: Medication: pravastatin 40 mg and isosorbide mononitrate  Has the patient contacted their pharmacy? {yes (Agent: If no, request that the patient contact the pharmacy for the refill.) (Agent: If yes, when and what did the pharmacy advise?) pharm is calling these will be new rxs to them Preferred Pharmacy (with phone number or street name): [facility]  Agent: Please be advised that RX refills may take up to 3 business days. We ask that you follow-up with your pharmacy.

## 2018-03-09 DIAGNOSIS — I480 Paroxysmal atrial fibrillation: Secondary | ICD-10-CM | POA: Diagnosis not present

## 2018-03-09 DIAGNOSIS — Z471 Aftercare following joint replacement surgery: Secondary | ICD-10-CM | POA: Diagnosis not present

## 2018-03-09 DIAGNOSIS — E1122 Type 2 diabetes mellitus with diabetic chronic kidney disease: Secondary | ICD-10-CM | POA: Diagnosis not present

## 2018-03-09 DIAGNOSIS — N182 Chronic kidney disease, stage 2 (mild): Secondary | ICD-10-CM | POA: Diagnosis not present

## 2018-03-09 DIAGNOSIS — E1140 Type 2 diabetes mellitus with diabetic neuropathy, unspecified: Secondary | ICD-10-CM | POA: Diagnosis not present

## 2018-03-09 DIAGNOSIS — I129 Hypertensive chronic kidney disease with stage 1 through stage 4 chronic kidney disease, or unspecified chronic kidney disease: Secondary | ICD-10-CM | POA: Diagnosis not present

## 2018-03-10 ENCOUNTER — Telehealth: Payer: Self-pay

## 2018-03-10 ENCOUNTER — Ambulatory Visit: Payer: Medicare Other | Admitting: Urology

## 2018-03-10 NOTE — Telephone Encounter (Signed)
Ok not on coumadin any longer   [place]

## 2018-03-10 NOTE — Telephone Encounter (Signed)
Copied from [place] [id number]. Topic: Quick Communication - See Telephone Encounter >> Mar 10, 2018 12:08 PM Antonieta Iba C wrote: CRM for notification. See Telephone encounter for: 03/10/18.   [facility] called in to provider aware of INR result  Resulted yesterday 03/09/18 at 1.0  CB: [phone]

## 2018-03-10 NOTE — Telephone Encounter (Signed)
FYI

## 2018-03-11 DIAGNOSIS — E1140 Type 2 diabetes mellitus with diabetic neuropathy, unspecified: Secondary | ICD-10-CM | POA: Diagnosis not present

## 2018-03-11 DIAGNOSIS — R1032 Left lower quadrant pain: Secondary | ICD-10-CM | POA: Diagnosis not present

## 2018-03-11 DIAGNOSIS — I129 Hypertensive chronic kidney disease with stage 1 through stage 4 chronic kidney disease, or unspecified chronic kidney disease: Secondary | ICD-10-CM | POA: Diagnosis not present

## 2018-03-11 DIAGNOSIS — M48061 Spinal stenosis, lumbar region without neurogenic claudication: Secondary | ICD-10-CM | POA: Diagnosis not present

## 2018-03-11 DIAGNOSIS — M7981 Nontraumatic hematoma of soft tissue: Secondary | ICD-10-CM | POA: Diagnosis not present

## 2018-03-11 DIAGNOSIS — Z471 Aftercare following joint replacement surgery: Secondary | ICD-10-CM | POA: Diagnosis not present

## 2018-03-13 DIAGNOSIS — I6523 Occlusion and stenosis of bilateral carotid arteries: Secondary | ICD-10-CM | POA: Diagnosis not present

## 2018-03-13 DIAGNOSIS — R0989 Other specified symptoms and signs involving the circulatory and respiratory systems: Secondary | ICD-10-CM | POA: Diagnosis not present

## 2018-03-14 DIAGNOSIS — Z471 Aftercare following joint replacement surgery: Secondary | ICD-10-CM | POA: Diagnosis not present

## 2018-03-14 DIAGNOSIS — E1140 Type 2 diabetes mellitus with diabetic neuropathy, unspecified: Secondary | ICD-10-CM | POA: Diagnosis not present

## 2018-03-14 DIAGNOSIS — R1032 Left lower quadrant pain: Secondary | ICD-10-CM | POA: Diagnosis not present

## 2018-03-14 DIAGNOSIS — M7981 Nontraumatic hematoma of soft tissue: Secondary | ICD-10-CM | POA: Diagnosis not present

## 2018-03-14 DIAGNOSIS — M48061 Spinal stenosis, lumbar region without neurogenic claudication: Secondary | ICD-10-CM | POA: Diagnosis not present

## 2018-03-14 DIAGNOSIS — I129 Hypertensive chronic kidney disease with stage 1 through stage 4 chronic kidney disease, or unspecified chronic kidney disease: Secondary | ICD-10-CM | POA: Diagnosis not present

## 2018-03-15 ENCOUNTER — Other Ambulatory Visit: Payer: Self-pay | Admitting: Internal Medicine

## 2018-03-15 ENCOUNTER — Telehealth: Payer: Self-pay | Admitting: Internal Medicine

## 2018-03-15 DIAGNOSIS — I10 Essential (primary) hypertension: Secondary | ICD-10-CM

## 2018-03-15 MED ORDER — AMLODIPINE BESYLATE 5 MG PO TABS: 5.0000 mg | ORAL_TABLET | Freq: Every day | ORAL | 1 refills | Status: DC

## 2018-03-15 MED ORDER — PRAVASTATIN SODIUM 40 MG PO TABS: 40.0000 mg | ORAL_TABLET | Freq: Every day | ORAL | 3 refills | Status: DC

## 2018-03-15 NOTE — Telephone Encounter (Signed)
Advise with refill of amlodipine take 5 mg daily and if needed can take another 5 mg for BP >140/>90   Inform wife

## 2018-03-16 ENCOUNTER — Telehealth: Payer: Self-pay | Admitting: Internal Medicine

## 2018-03-16 ENCOUNTER — Encounter: Payer: Self-pay | Admitting: Internal Medicine

## 2018-03-16 LAB — PROTIME-INR: INR: 0.8 — AB (ref 0.9–1.1)

## 2018-03-16 NOTE — Telephone Encounter (Signed)
Call from [facility] with INR result of 0.8. It would appear patient is not currently on coumadin. Please contact agency as he should be continuing to get INR draws. No action needed.

## 2018-03-17 NOTE — Telephone Encounter (Signed)
Call [facility] cancel INR reading he is not on anticoagulation and calls not needed   Thanks [place]

## 2018-03-17 NOTE — Telephone Encounter (Signed)
Patient wife notified as ordered and voiced understanding.

## 2018-03-20 NOTE — Telephone Encounter (Signed)
On your desk please fax to [facility]  Thanks [place]

## 2018-03-20 NOTE — Telephone Encounter (Signed)
Need written order to  Fax to [facility]. To DC coumadin checks.

## 2018-03-21 DIAGNOSIS — M48061 Spinal stenosis, lumbar region without neurogenic claudication: Secondary | ICD-10-CM | POA: Diagnosis not present

## 2018-03-21 DIAGNOSIS — E1140 Type 2 diabetes mellitus with diabetic neuropathy, unspecified: Secondary | ICD-10-CM | POA: Diagnosis not present

## 2018-03-21 DIAGNOSIS — M7981 Nontraumatic hematoma of soft tissue: Secondary | ICD-10-CM | POA: Diagnosis not present

## 2018-03-21 DIAGNOSIS — R1032 Left lower quadrant pain: Secondary | ICD-10-CM | POA: Diagnosis not present

## 2018-03-21 DIAGNOSIS — I129 Hypertensive chronic kidney disease with stage 1 through stage 4 chronic kidney disease, or unspecified chronic kidney disease: Secondary | ICD-10-CM | POA: Diagnosis not present

## 2018-03-21 DIAGNOSIS — Z471 Aftercare following joint replacement surgery: Secondary | ICD-10-CM | POA: Diagnosis not present

## 2018-03-21 NOTE — Telephone Encounter (Signed)
Order faxed.

## 2018-03-23 ENCOUNTER — Other Ambulatory Visit: Payer: Self-pay

## 2018-03-23 DIAGNOSIS — E1140 Type 2 diabetes mellitus with diabetic neuropathy, unspecified: Secondary | ICD-10-CM | POA: Diagnosis not present

## 2018-03-23 DIAGNOSIS — M48061 Spinal stenosis, lumbar region without neurogenic claudication: Secondary | ICD-10-CM | POA: Diagnosis not present

## 2018-03-23 DIAGNOSIS — I129 Hypertensive chronic kidney disease with stage 1 through stage 4 chronic kidney disease, or unspecified chronic kidney disease: Secondary | ICD-10-CM | POA: Diagnosis not present

## 2018-03-23 DIAGNOSIS — M7981 Nontraumatic hematoma of soft tissue: Secondary | ICD-10-CM | POA: Diagnosis not present

## 2018-03-23 DIAGNOSIS — R1032 Left lower quadrant pain: Secondary | ICD-10-CM | POA: Diagnosis not present

## 2018-03-23 DIAGNOSIS — Z471 Aftercare following joint replacement surgery: Secondary | ICD-10-CM | POA: Diagnosis not present

## 2018-03-23 DIAGNOSIS — N3941 Urge incontinence: Secondary | ICD-10-CM

## 2018-03-23 MED ORDER — SILODOSIN 8 MG PO CAPS: 8.0000 mg | ORAL_CAPSULE | Freq: Every day | ORAL | 1 refills | Status: DC

## 2018-03-24 DIAGNOSIS — M1711 Unilateral primary osteoarthritis, right knee: Secondary | ICD-10-CM | POA: Diagnosis not present

## 2018-03-24 DIAGNOSIS — Z96641 Presence of right artificial hip joint: Secondary | ICD-10-CM | POA: Diagnosis not present

## 2018-03-27 ENCOUNTER — Telehealth: Payer: Self-pay | Admitting: Internal Medicine

## 2018-03-27 DIAGNOSIS — I482 Chronic atrial fibrillation: Secondary | ICD-10-CM | POA: Diagnosis not present

## 2018-03-27 DIAGNOSIS — R1032 Left lower quadrant pain: Secondary | ICD-10-CM | POA: Diagnosis not present

## 2018-03-27 DIAGNOSIS — M7981 Nontraumatic hematoma of soft tissue: Secondary | ICD-10-CM | POA: Diagnosis not present

## 2018-03-27 DIAGNOSIS — M48061 Spinal stenosis, lumbar region without neurogenic claudication: Secondary | ICD-10-CM | POA: Diagnosis not present

## 2018-03-27 DIAGNOSIS — E1140 Type 2 diabetes mellitus with diabetic neuropathy, unspecified: Secondary | ICD-10-CM | POA: Diagnosis not present

## 2018-03-27 DIAGNOSIS — I129 Hypertensive chronic kidney disease with stage 1 through stage 4 chronic kidney disease, or unspecified chronic kidney disease: Secondary | ICD-10-CM | POA: Diagnosis not present

## 2018-03-27 DIAGNOSIS — Z7901 Long term (current) use of anticoagulants: Secondary | ICD-10-CM | POA: Diagnosis not present

## 2018-03-27 DIAGNOSIS — Z471 Aftercare following joint replacement surgery: Secondary | ICD-10-CM | POA: Diagnosis not present

## 2018-03-27 LAB — PROTIME-INR: INR: 0.9 (ref 0.9–1.1)

## 2018-03-27 NOTE — Telephone Encounter (Signed)
Sharyn Lull calling to report out of range INR:  0.9

## 2018-03-27 NOTE — Telephone Encounter (Signed)
Please fax [facility] d/c of INR placed on desk/keyboard 1-2 weeks ago   Thanks tMS

## 2018-03-27 NOTE — Telephone Encounter (Signed)
Current INR 0.9.  Last INR was 2.2 on 02/19/18.  Patient is not taking any coumadin.

## 2018-03-28 DIAGNOSIS — I2510 Atherosclerotic heart disease of native coronary artery without angina pectoris: Secondary | ICD-10-CM | POA: Diagnosis not present

## 2018-03-28 DIAGNOSIS — E782 Mixed hyperlipidemia: Secondary | ICD-10-CM | POA: Diagnosis not present

## 2018-03-28 DIAGNOSIS — G4733 Obstructive sleep apnea (adult) (pediatric): Secondary | ICD-10-CM | POA: Diagnosis not present

## 2018-03-28 DIAGNOSIS — I480 Paroxysmal atrial fibrillation: Secondary | ICD-10-CM | POA: Diagnosis not present

## 2018-03-28 NOTE — Telephone Encounter (Signed)
Order faxed for the second time to [facility].

## 2018-03-30 DIAGNOSIS — M7981 Nontraumatic hematoma of soft tissue: Secondary | ICD-10-CM | POA: Diagnosis not present

## 2018-03-30 DIAGNOSIS — R1032 Left lower quadrant pain: Secondary | ICD-10-CM | POA: Diagnosis not present

## 2018-03-30 DIAGNOSIS — M48061 Spinal stenosis, lumbar region without neurogenic claudication: Secondary | ICD-10-CM | POA: Diagnosis not present

## 2018-03-30 DIAGNOSIS — I129 Hypertensive chronic kidney disease with stage 1 through stage 4 chronic kidney disease, or unspecified chronic kidney disease: Secondary | ICD-10-CM | POA: Diagnosis not present

## 2018-03-30 DIAGNOSIS — Z471 Aftercare following joint replacement surgery: Secondary | ICD-10-CM | POA: Diagnosis not present

## 2018-03-30 DIAGNOSIS — E1140 Type 2 diabetes mellitus with diabetic neuropathy, unspecified: Secondary | ICD-10-CM | POA: Diagnosis not present

## 2018-04-03 ENCOUNTER — Ambulatory Visit (INDEPENDENT_AMBULATORY_CARE_PROVIDER_SITE_OTHER): Payer: Medicare Other | Admitting: Internal Medicine

## 2018-04-03 ENCOUNTER — Encounter: Payer: Self-pay | Admitting: Internal Medicine

## 2018-04-03 VITALS — BP 108/60 | HR 67 | Temp 98.1°F | Ht 68.0 in | Wt 187.4 lb

## 2018-04-03 DIAGNOSIS — I10 Essential (primary) hypertension: Secondary | ICD-10-CM

## 2018-04-03 DIAGNOSIS — E1142 Type 2 diabetes mellitus with diabetic polyneuropathy: Secondary | ICD-10-CM

## 2018-04-03 DIAGNOSIS — R946 Abnormal results of thyroid function studies: Secondary | ICD-10-CM

## 2018-04-03 DIAGNOSIS — R4182 Altered mental status, unspecified: Secondary | ICD-10-CM | POA: Diagnosis not present

## 2018-04-03 DIAGNOSIS — D72829 Elevated white blood cell count, unspecified: Secondary | ICD-10-CM

## 2018-04-03 NOTE — Progress Notes (Signed)
No chief complaint on file.  F/u with wife Eula  1. HTN controlled wife giving pt Norvasc 5 mg bid, coreg 25 bid, hydralazine100 tid 2. DM 2 not on ACEI/ARB due to allergic 01/12/17 A1c 5.8, A1C 01/27/18 6.1 per wife not taking levemir 95 units any longer and I prev advised to reduce to 80 units levemir. He is using Humalog SSI 4x per day and f/u with Dr. Solum  -need to check urine protein, lipid [facility] form  3. Short term memory loss and recent SLUMS with neurology +dementia retested memory today and score 28/30 he has upcoming f/u with neurology and per wife will disc medications for memory will CC note to see MMSE score today 28/20 -wife has concerns about pt driving and will disc with neurology pt wants to continue driving but per wife she does not feel he is safe to drive   4. H/o abnormal tfts TSH 5.009 elevated 02/09/18 will repeat labs TSH, FT4, and FT3 and anti thyroid abs given [facility] form  5. Leukocytosis given [facility] form to check CBC   Review of Systems  Constitutional: Negative for weight loss.  HENT: Positive for hearing loss.   Eyes: Negative for blurred vision.  Respiratory: Negative for shortness of breath.   Cardiovascular: Negative for chest pain.  Gastrointestinal: Negative for abdominal pain.  Musculoskeletal: Negative for falls.  Skin: Negative for rash.  Neurological: Negative for headaches.  Psychiatric/Behavioral: Positive for memory loss. Negative for depression.   Past Medical History:  Diagnosis Date  . Arthritis   . Atrial fibrillation (HCC)   . CAD (coronary artery disease)    6 STENTS  . CHF (congestive heart failure) (HCC)   . Chicken pox   . Colon polyp   . Corneal dystrophy    bilateral  . Crohn's disease (HCC)   . Diabetes (HCC) 2002  . Dysrhythmia    A-FIB AND PAF  . GERD (gastroesophageal reflux disease)   . Gout   . History of kidney stones   . History of shingles   . Hyperlipemia   . Hypertension   . Kidney stones   . Myocardial  infarction (HCC)    x4, last one in 2010  . Peripheral neuropathy   . Peripheral neuropathy   . Peripheral neuropathy   . PUD (peptic ulcer disease)   . Rectus sheath hematoma    02/18/18 8.9 x 4.5 cm then 02/19/18 7.9 x 4.5 cm   . Renal artery stenosis (HCC)   . Renal artery stenosis (HCC)   . Salzmann's nodular dystrophy 2012  . Sleep apnea   . Spinal stenosis    Past Surgical History:  Procedure Laterality Date  . ABLATION  2012  . APPLICATION VERTERBRAL DEFECT PROSTHETIC  05/01/2013  . ARTHRODESIS ANTERIOR LUMBAR SPINE  05/01/2013  . BACK SURGERY     lumbar fusion  . CARDIAC CATHETERIZATION    . CARDIAC ELECTROPHYSIOLOGY STUDY AND ABLATION    . CARDIAC SURGERY    . CARDIOVERSION    . CHOLECYSTECTOMY    . COLONOSCOPY WITH PROPOFOL N/A 04/09/2016   Completed for chronic diarrhea.  Few scattered diverticuli.  No mucosal lesions.  Surgeon: Martin U Skulskie, MD;  Location: [facility];  Service: Endoscopy;  Laterality: N/A;  . CORNEAL EYE SURGERY Bilateral 07/28/11    09/22/2011  . CORONARY ANGIOPLASTY    . CORONARY ANGIOPLASTY WITH STENT PLACEMENT  03/28/2015   Distal 80% to normal Stent, Dilation Balloon  . CORONARY ARTERY BYPASS GRAFT       4 VESSELS  . EYE SURGERY     bilateral cataract  . foot and ankle repair Right 2005  . FRACTURE SURGERY     ANKLE PLATE AND SCREWS  . GALLBLADER    . JOINT REPLACEMENT     left total hip 11/11/15  . JOINT REPLACEMENT     right total hip 12/2017 Dr. Menz   . KNEE ARTHROSCOPY Right   . LUMBAR SPINE FUSION ONE LEVEL  05/03/2013  . RENAL ARTERY STENT Right   . ROTATOR CUFF REPAIR Right 2006  . TONSILLECTOMY    . TOTAL HIP ARTHROPLASTY Left 11/11/2015   Procedure: TOTAL HIP ARTHROPLASTY ANTERIOR APPROACH;  Surgeon: Michael Menz, MD;  Location: [facility] OR[facility];  Service: Orthopedics;  Laterality: Left;  . TOTAL HIP ARTHROPLASTY Right 12/13/2017   Procedure: TOTAL HIP ARTHROPLASTY ANTERIOR APPROACH;  Surgeon: Menz, Michael, MD;  Location: [facility] ORS;   Service: Orthopedics;  Laterality: Right;  . TRANSCATH PLACEMENT INTRAVASCULAR STENT LEG  03/2015   Family History  Problem Relation Age of Onset  . CVA Father   . Stroke Father   . Lung cancer Mother   . Arthritis Mother   . Stomach cancer Sister   . Colon cancer Sister   . Diabetes Brother    Social History   Socioeconomic History  . Marital status: Married    Spouse name: Not on file  . Number of children: Not on file  . Years of education: Not on file  . Highest education level: Not on file  Occupational History  . Not on file  Social Needs  . Financial resource strain: Not on file  . Food insecurity:    Worry: Not on file    Inability: Not on file  . Transportation needs:    Medical: Not on file    Non-medical: Not on file  Tobacco Use  . Smoking status: Former Smoker    Packs/day: 1.00    Years: 3.00    Pack years: 3.00    Types: Cigarettes    Last attempt to quit: 06/30/1962    Years since quitting: 55.7  . Smokeless tobacco: Former User    Types: Chew    Quit date: 12/05/1968  Substance and Sexual Activity  . Alcohol use: No  . Drug use: No  . Sexual activity: Not Currently  Lifestyle  . Physical activity:    Days per week: Not on file    Minutes per session: Not on file  . Stress: Not on file  Relationships  . Social connections:    Talks on phone: Not on file    Gets together: Not on file    Attends religious service: Not on file    Active member of club or organization: Not on file    Attends meetings of clubs or organizations: Not on file    Relationship status: Not on file  . Intimate partner violence:    Fear of current or ex partner: Not on file    Emotionally abused: Not on file    Physically abused: Not on file    Forced sexual activity: Not on file  Other Topics Concern  . Not on file  Social History Narrative   Married    No outpatient medications have been marked as taking for the 04/03/18 encounter (Appointment) with  McLean-Scocuzza,  N, MD.   Allergies  Allergen Reactions  . Allopurinol Diarrhea, Other (See Comments) and Nausea And Vomiting    Other Reaction: GI Upset  . Atenolol   Other (See Comments)    Other Reaction: bradycardia  . Atorvastatin Other (See Comments)    Other Reaction: muscle aches  . Ramipril Rash  . Rosuvastatin Other (See Comments)    Muscle aches  . Simvastatin Other (See Comments)    Other Reaction: MUSCLE ACHES (Zocor)  . Valsartan Other (See Comments)    Other Reaction: facial swelling  . Cardizem [Diltiazem] Rash   Recent Results (from the past 2160 hour(s))  VITAMIN D 25 Hydroxy (Vit-D Deficiency, Fractures)     Status: None   Collection Time: 01/12/18  4:23 PM  Result Value Ref Range   Vit D, 25-Hydroxy 37.8 30.0 - 100.0 ng/mL    Comment: (NOTE) Vitamin D deficiency has been defined by the [place] practice guideline as a level of serum 25-OH vitamin D less than 20 ng/mL (1,2). The Endocrine Society went on to further define vitamin D insufficiency as a level between 21 and 29 ng/mL (2). 1. IOM (Institute of Medicine). 2010. Dietary reference   intakes for calcium and D. [place]: The   [employer]. 2. Holick MF, Binkley Dinosaur, Bischoff-Ferrari HA, et al.   Evaluation, treatment, and prevention of vitamin D   deficiency: an Endocrine Society clinical practice   guideline. JCEM. 2011 Jul; 96(7):1911-30. Performed At: [facility] [place], [place] [id number] Rush Farmer MD HC:[id number] Performed at [facility], [address] [place], [place] [address]   Hemoglobin A1c     Status: Abnormal   Collection Time: 01/12/18  4:23 PM  Result Value Ref Range   Hgb A1c MFr Bld 5.8 (H) 4.8 - 5.6 %    Comment: (NOTE) Pre diabetes:          5.7%-6.4% Diabetes:              >6.4% Glycemic control for   <7.0% adults with diabetes    Mean Plasma Glucose 119.76 mg/dL     Comment: Performed at [facility] [address], [place], [place] [address]  Protime-INR     Status: Abnormal   Collection Time: 01/12/18  4:23 PM  Result Value Ref Range   Prothrombin Time 18.3 (H) 11.4 - 15.2 seconds   INR 1.53     Comment: Performed at [facility], [address] [place], [place] [address]  CBC with Differential/Platelet     Status: Abnormal   Collection Time: 01/12/18  4:23 PM  Result Value Ref Range   WBC 7.1 3.8 - 10.6 K/uL   RBC 3.60 (L) 4.40 - 5.90 MIL/uL   Hemoglobin 12.3 (L) 13.0 - 18.0 g/dL   HCT 35.9 (L) 40.0 - 52.0 %   MCV 99.5 80.0 - 100.0 fL   MCH 34.3 (H) 26.0 - 34.0 pg   MCHC 34.4 32.0 - 36.0 g/dL   RDW 15.3 (H) 11.5 - 14.5 %   Platelets 274 150 - 440 K/uL   Neutrophils Relative % 70 %   Neutro Abs 5.0 1.4 - 6.5 K/uL   Lymphocytes Relative 15 %   Lymphs Abs 1.1 1.0 - 3.6 K/uL   Monocytes Relative 11 %   Monocytes Absolute 0.8 0.2 - 1.0 K/uL   Eosinophils Relative 4 %   Eosinophils Absolute 0.3 0 - 0.7 K/uL   Basophils Relative 0 %   Basophils Absolute 0.0 0 - 0.1 K/uL    Comment: Performed at [facility], [address] [place], [place] [address]  Comprehensive metabolic panel     Status: Abnormal   Collection Time: 01/12/18  4:23 PM  Result Value Ref Range   Sodium 138 135 - 145 mmol/L   Potassium 3.4 (L) 3.5 - 5.1 mmol/L   Chloride 102 101 - 111 mmol/L   CO2 24 22 - 32 mmol/L   Glucose, Bld 191 (H) 65 - 99 mg/dL   BUN 11 6 - 20 mg/dL   Creatinine, Ser 0.91 0.61 - 1.24 mg/dL   Calcium 9.2 8.9 - 10.3 mg/dL   Total Protein 7.7 6.5 - 8.1 g/dL   Albumin 3.6 3.5 - 5.0 g/dL   AST 24 15 - 41 U/L   ALT 12 (L) 17 - 63 U/L   Alkaline Phosphatase 90 38 - 126 U/L   Total Bilirubin 0.7 0.3 - 1.2 mg/dL   GFR calc non Af Amer >60 >60 mL/min   GFR calc Af Amer >60 >60 mL/min    Comment: (NOTE) The eGFR has been calculated using the CKD EPI equation. This calculation has not been validated in all clinical  situations. eGFR's persistently <60 mL/min signify possible Chronic Kidney Disease.    Anion gap 12 5 - 15    Comment: Performed at [facility], [address] [place], [place] [address]  Protime-INR     Status: Abnormal   Collection Time: 01/23/18 11:36 AM  Result Value Ref Range   Prothrombin Time 26.7 (H) 11.4 - 15.2 seconds   INR 2.49     Comment: Performed at [facility], [address] [place], [place] [address]  Culture, blood (single) w Reflex to ID Panel     Status: None   Collection Time: 01/23/18 11:36 AM  Result Value Ref Range   Specimen Description BLOOD RIGHT ANTECUBITAL    Special Requests      BOTTLES DRAWN AEROBIC AND ANAEROBIC Blood Culture adequate volume   Culture      NO GROWTH 5 DAYS Performed at [facility], [address] [place], [place] [address]    Report Status 01/28/2018 FINAL   Culture, blood (single) w Reflex to ID Panel     Status: None   Collection Time: 01/23/18 11:36 AM  Result Value Ref Range   Specimen Description BLOOD BLOOD RIGHT HAND    Special Requests      BOTTLES DRAWN AEROBIC AND ANAEROBIC Blood Culture adequate volume   Culture      NO GROWTH 5 DAYS Performed at [facility], [address] [place], [place] [address]    Report Status 01/28/2018 FINAL   Urinalysis, Complete     Status: Abnormal   Collection Time: 01/31/18  1:21 PM  Result Value Ref Range   Specific Gravity, UA >1.030 (H) 1.005 - 1.030   pH, UA 5.0 5.0 - 7.5   Color, UA Yellow Yellow   Appearance Ur Cloudy (A) Clear   Leukocytes, UA Negative Negative   Protein, UA Trace (A) Negative/Trace   Glucose, UA Negative Negative   Ketones, UA Trace (A) Negative   RBC, UA Negative Negative   Bilirubin, UA Negative Negative   Urobilinogen, Ur 0.2 0.2 - 1.0 mg/dL   Nitrite, UA Negative Negative   Microscopic Examination See below:   Microscopic Examination     Status: Abnormal   Collection Time: 01/31/18  1:21 PM  Result  Value Ref Range   WBC, UA None seen 0 - 5 /hpf   RBC, UA None seen 0 - 2 /hpf     Epithelial Cells (non renal) None seen 0 - 10 /hpf   Casts Present (A) None seen /lpf   Cast Type Hyaline casts N/A   Crystals Present (A) N/A   Crystal Type Calcium Oxalate N/A   Mucus, UA Present (A) Not Estab.   Bacteria, UA Few (A) None seen/Few  Comprehensive metabolic panel     Status: Abnormal   Collection Time: 02/09/18  4:54 PM  Result Value Ref Range   Sodium 134 (L) 135 - 145 mmol/L   Potassium 4.0 3.5 - 5.1 mmol/L   Chloride 99 (L) 101 - 111 mmol/L   CO2 23 22 - 32 mmol/L   Glucose, Bld 141 (H) 65 - 99 mg/dL   BUN 17 6 - 20 mg/dL   Creatinine, Ser 0.86 0.61 - 1.24 mg/dL   Calcium 9.5 8.9 - 10.3 mg/dL   Total Protein 8.4 (H) 6.5 - 8.1 g/dL   Albumin 3.8 3.5 - 5.0 g/dL   AST 25 15 - 41 U/L   ALT 17 17 - 63 U/L   Alkaline Phosphatase 88 38 - 126 U/L   Total Bilirubin 0.7 0.3 - 1.2 mg/dL   GFR calc non Af Amer >60 >60 mL/min   GFR calc Af Amer >60 >60 mL/min    Comment: (NOTE) The eGFR has been calculated using the CKD EPI equation. This calculation has not been validated in all clinical situations. eGFR's persistently <60 mL/min signify possible Chronic Kidney Disease.    Anion gap 12 5 - 15    Comment: Performed at [facility], [address] [place], [place] [address]  Lactic acid, plasma     Status: None   Collection Time: 02/09/18  4:54 PM  Result Value Ref Range   Lactic Acid, Venous 1.7 0.5 - 1.9 mmol/L    Comment: Performed at [facility], [address] [place], [place] [address]  CBC with Differential     Status: Abnormal   Collection Time: 02/09/18  4:54 PM  Result Value Ref Range   WBC 9.9 3.8 - 10.6 K/uL   RBC 3.90 (L) 4.40 - 5.90 MIL/uL   Hemoglobin 12.9 (L) 13.0 - 18.0 g/dL   HCT 37.4 (L) 40.0 - 52.0 %   MCV 95.9 80.0 - 100.0 fL   MCH 32.9 26.0 - 34.0 pg   MCHC 34.4 32.0 - 36.0 g/dL   RDW 15.0 (H) 11.5 - 14.5 %   Platelets 283 150 - 440  K/uL   Neutrophils Relative % 64 %   Neutro Abs 6.4 1.4 - 6.5 K/uL   Lymphocytes Relative 15 %   Lymphs Abs 1.5 1.0 - 3.6 K/uL   Monocytes Relative 12 %   Monocytes Absolute 1.2 (H) 0.2 - 1.0 K/uL   Eosinophils Relative 8 %   Eosinophils Absolute 0.8 (H) 0 - 0.7 K/uL   Basophils Relative 1 %   Basophils Absolute 0.1 0 - 0.1 K/uL    Comment: Performed at [facility], [address], [place], [place] [address]  Protime-INR     Status: Abnormal   Collection Time: 02/09/18  4:54 PM  Result Value Ref Range   Prothrombin Time 22.3 (H) 11.4 - 15.2 seconds   INR 1.98     Comment: Performed at [facility], [address] [place], [place] [address]  Culture, blood (Routine x 2)     Status: None   Collection Time: 02/09/18  4:54 PM  Result Value Ref Range   Specimen Description  BLOOD LEFT ANTECUBITAL    Special Requests      BOTTLES DRAWN AEROBIC AND ANAEROBIC Blood Culture results may not be optimal due to an excessive volume of blood received in culture bottles   Culture      NO GROWTH 5 DAYS Performed at [facility], [address] [place], [place] [address]    Report Status 02/14/2018 FINAL   TSH     Status: Abnormal   Collection Time: 02/09/18  4:54 PM  Result Value Ref Range   TSH 5.099 (H) 0.350 - 4.500 uIU/mL    Comment: Performed by a 3rd Generation assay with a functional sensitivity of <=0.01 uIU/mL. Performed at [facility], [address] [place], [place] [address]   Glucose, capillary     Status: Abnormal   Collection Time: 02/09/18  7:59 PM  Result Value Ref Range   Glucose-Capillary 155 (H) 65 - 99 mg/dL  Blood gas, arterial     Status: Abnormal   Collection Time: 02/09/18  9:17 PM  Result Value Ref Range   FIO2 28.00    pH, Arterial 7.45 7.350 - 7.450   pCO2 arterial 37 32.0 - 48.0 mmHg   pO2, Arterial 74 (L) 83.0 - 108.0 mmHg   Bicarbonate 25.7 20.0 - 28.0 mmol/L   Acid-Base Excess 1.8 0.0 - 2.0 mmol/L   O2  Saturation 95.4 %   Patient temperature 37.0    Sample type ARTERIAL DRAW    Allens test (pass/fail) PASS PASS    Comment: Performed at [facility], [address] [place], [place] [address]  Glucose, capillary     Status: Abnormal   Collection Time: 02/09/18 11:41 PM  Result Value Ref Range   Glucose-Capillary 240 (H) 65 - 99 mg/dL  Urinalysis, Complete w Microscopic     Status: Abnormal   Collection Time: 02/10/18  3:42 AM  Result Value Ref Range   Color, Urine YELLOW (A) YELLOW   APPearance CLEAR (A) CLEAR   Specific Gravity, Urine 1.013 1.005 - 1.030   pH 5.0 5.0 - 8.0   Glucose, UA NEGATIVE NEGATIVE mg/dL   Hgb urine dipstick NEGATIVE NEGATIVE   Bilirubin Urine NEGATIVE NEGATIVE   Ketones, ur NEGATIVE NEGATIVE mg/dL   Protein, ur NEGATIVE NEGATIVE mg/dL   Nitrite NEGATIVE NEGATIVE   Leukocytes, UA NEGATIVE NEGATIVE   RBC / HPF 0-5 0 - 5 RBC/hpf   WBC, UA 0-5 0 - 5 WBC/hpf   Bacteria, UA RARE (A) NONE SEEN   Squamous Epithelial / LPF NONE SEEN 0 - 5    Comment: Performed at [facility], [address] [place], [place] [address]  Glucose, capillary     Status: Abnormal   Collection Time: 02/10/18  8:01 AM  Result Value Ref Range   Glucose-Capillary 197 (H) 65 - 99 mg/dL  Protime-INR     Status: Abnormal   Collection Time: 02/10/18 11:17 AM  Result Value Ref Range   Prothrombin Time 19.8 (H) 11.4 - 15.2 seconds   INR 1.70     Comment: Performed at [facility], [address] [place], [place] [address]  Glucose, capillary     Status: Abnormal   Collection Time: 02/10/18 12:18 PM  Result Value Ref Range   Glucose-Capillary 251 (H) 65 - 99 mg/dL  Glucose, capillary     Status: Abnormal   Collection Time: 02/10/18  5:40 PM  Result Value Ref Range   Glucose-Capillary 177 (H) 65 - 99 mg/dL    Glucose, capillary     Status: Abnormal   Collection Time: 02/10/18  9:09 PM  Result Value Ref Range   Glucose-Capillary 163 (H) 65 - 99 mg/dL   Protime-INR     Status: Abnormal   Collection Time: 02/11/18  4:52 AM  Result Value Ref Range   Prothrombin Time 20.6 (H) 11.4 - 15.2 seconds   INR 1.79     Comment: Performed at [facility], [address] [place], [place] [address]  Glucose, capillary     Status: Abnormal   Collection Time: 02/11/18  7:30 AM  Result Value Ref Range   Glucose-Capillary 214 (H) 65 - 99 mg/dL  Glucose, capillary     Status: Abnormal   Collection Time: 02/11/18 12:05 PM  Result Value Ref Range   Glucose-Capillary 200 (H) 65 - 99 mg/dL  Glucose, capillary     Status: Abnormal   Collection Time: 02/11/18  4:58 PM  Result Value Ref Range   Glucose-Capillary 227 (H) 65 - 99 mg/dL  Protime-INR     Status: Abnormal   Collection Time: 02/16/18 11:31 AM  Result Value Ref Range   Prothrombin Time 22.1 (H) 11.4 - 15.2 seconds   INR 1.95     Comment: Performed at [facility], [address] [place], [place] [address]  Lipase, blood     Status: None   Collection Time: 02/18/18  1:48 PM  Result Value Ref Range   Lipase 20 11 - 51 U/L    Comment: Performed at [facility], [address] [place], [place] [address]  Comprehensive metabolic panel     Status: Abnormal   Collection Time: 02/18/18  1:48 PM  Result Value Ref Range   Sodium 134 (L) 135 - 145 mmol/L   Potassium 4.7 3.5 - 5.1 mmol/L   Chloride 97 (L) 101 - 111 mmol/L   CO2 23 22 - 32 mmol/L   Glucose, Bld 307 (H) 65 - 99 mg/dL   BUN 14 6 - 20 mg/dL   Creatinine, Ser 0.95 0.61 - 1.24 mg/dL   Calcium 9.7 8.9 - 10.3 mg/dL   Total Protein 8.2 (H) 6.5 - 8.1 g/dL   Albumin 3.7 3.5 - 5.0 g/dL   AST 26 15 - 41 U/L   ALT 17 17 - 63 U/L   Alkaline Phosphatase 103 38 - 126 U/L   Total Bilirubin 0.5 0.3 - 1.2 mg/dL   GFR calc non Af Amer >60 >60 mL/min   GFR calc Af Amer >60 >60 mL/min    Comment: (NOTE) The eGFR has been calculated using the CKD EPI equation. This calculation has not been validated in all clinical  situations. eGFR's persistently <60 mL/min signify possible Chronic Kidney Disease.    Anion gap 14 5 - 15    Comment: Performed at [facility], [address] [place], [place] [address]  CBC     Status: Abnormal   Collection Time: 02/18/18  1:48 PM  Result Value Ref Range   WBC 12.1 (H) 3.8 - 10.6 K/uL   RBC 3.79 (L) 4.40 - 5.90 MIL/uL   Hemoglobin 12.2 (L) 13.0 - 18.0 g/dL   HCT 36.1 (L) 40.0 - 52.0 %   MCV 95.2 80.0 - 100.0 fL   MCH 32.2 26.0 - 34.0 pg   MCHC 33.8 32.0 - 36.0 g/dL   RDW 15.1 (H) 11.5 - 14.5 %   Platelets 343 150 - 440 K/uL    Comment: Performed at [facility], [address] [place], [place] [address]  Urinalysis, Complete w Microscopic     Status: Abnormal   Collection Time: 02/18/18  1:48 PM  Result Value Ref Range   Color, Urine YELLOW (A) YELLOW   APPearance CLEAR (A) CLEAR   Specific Gravity, Urine 1.017 1.005 - 1.030   pH 5.0 5.0 - 8.0   Glucose, UA >=500 (A) NEGATIVE mg/dL   Hgb urine dipstick NEGATIVE NEGATIVE   Bilirubin Urine NEGATIVE NEGATIVE   Ketones, ur NEGATIVE NEGATIVE mg/dL   Protein, ur NEGATIVE NEGATIVE mg/dL   Nitrite NEGATIVE NEGATIVE   Leukocytes, UA NEGATIVE NEGATIVE   RBC / HPF 0-5 0 - 5 RBC/hpf   WBC, UA 0-5 0 - 5 WBC/hpf   Bacteria, UA NONE SEEN NONE SEEN   Squamous Epithelial / LPF NONE SEEN 0 - 5   Mucus PRESENT    Hyaline Casts, UA PRESENT    Ca Oxalate Crys, UA PRESENT     Comment: Performed at [facility], [address] [place], [place] [address]  Protime-INR     Status: Abnormal   Collection Time: 02/18/18  8:16 PM  Result Value Ref Range   Prothrombin Time 24.7 (H) 11.4 - 15.2 seconds   INR 2.25     Comment: Performed at [facility], [address] [place], [place] [address]  Comprehensive metabolic panel     Status: Abnormal   Collection Time: 02/19/18  9:25 PM  Result Value Ref Range   Sodium 133 (L) 135 - 145 mmol/L   Potassium 4.1 3.5 - 5.1 mmol/L   Chloride 97  (L) 101 - 111 mmol/L   CO2 23 22 - 32 mmol/L   Glucose, Bld 174 (H) 65 - 99 mg/dL   BUN 19 6 - 20 mg/dL   Creatinine, Ser 0.83 0.61 - 1.24 mg/dL   Calcium 9.0 8.9 - 10.3 mg/dL   Total Protein 8.0 6.5 - 8.1 g/dL   Albumin 3.6 3.5 - 5.0 g/dL   AST 28 15 - 41 U/L   ALT 15 (L) 17 - 63 U/L   Alkaline Phosphatase 100 38 - 126 U/L   Total Bilirubin 0.5 0.3 - 1.2 mg/dL   GFR calc non Af Amer >60 >60 mL/min   GFR calc Af Amer >60 >60 mL/min    Comment: (NOTE) The eGFR has been calculated using the CKD EPI equation. This calculation has not been validated in all clinical situations. eGFR's persistently <60 mL/min signify possible Chronic Kidney Disease.    Anion gap 13 5 - 15    Comment: Performed at [facility], [address] [place], [place] [address]  CBC     Status: Abnormal   Collection Time: 02/19/18  9:25 PM  Result Value Ref Range   WBC 11.7 (H) 3.8 - 10.6 K/uL   RBC 3.75 (L) 4.40 - 5.90 MIL/uL   Hemoglobin 12.2 (L) 13.0 - 18.0 g/dL   HCT 34.9 (L) 40.0 - 52.0 %   MCV 93.3 80.0 - 100.0 fL   MCH 32.6 26.0 - 34.0 pg   MCHC 35.0 32.0 - 36.0 g/dL   RDW 14.9 (H) 11.5 - 14.5 %   Platelets 311 150 - 440 K/uL    Comment: Performed at [facility] [place] [facility], [address] [place], [place] [address]  Protime-INR     Status: Abnormal   Collection Time: 02/19/18  9:25 PM  Result Value Ref Range   Prothrombin Time 24.3 (H) 11.4 -  15.2 seconds   INR 2.21     Comment: Performed at [facility], [address] [place], [place] [address]  Urinalysis, Complete w Microscopic     Status: Abnormal   Collection Time: 02/20/18 12:23 AM  Result Value Ref Range   Color, Urine YELLOW (A) YELLOW   APPearance CLEAR (A) CLEAR   Specific Gravity, Urine 1.028 1.005 - 1.030   pH 5.0 5.0 - 8.0   Glucose, UA NEGATIVE NEGATIVE mg/dL   Hgb urine dipstick NEGATIVE NEGATIVE   Bilirubin Urine NEGATIVE NEGATIVE   Ketones, ur NEGATIVE NEGATIVE mg/dL   Protein, ur NEGATIVE NEGATIVE  mg/dL   Nitrite NEGATIVE NEGATIVE   Leukocytes, UA NEGATIVE NEGATIVE   RBC / HPF 0-5 0 - 5 RBC/hpf   WBC, UA 0-5 0 - 5 WBC/hpf   Bacteria, UA NONE SEEN NONE SEEN   Squamous Epithelial / LPF NONE SEEN 0 - 5   Mucus PRESENT     Comment: Performed at [facility], [address] [place], [place] [address]  Glucose, capillary     Status: Abnormal   Collection Time: 02/20/18 12:38 AM  Result Value Ref Range   Glucose-Capillary 189 (H) 65 - 99 mg/dL  Protime-INR     Status: Abnormal   Collection Time: 03/16/18 12:00 AM  Result Value Ref Range   INR 0.8 (A) 0.9 - 1.1   Objective  There is no height or weight on file to calculate BMI. Wt Readings from Last 3 Encounters:  03/01/18 188 lb 3.2 oz (85.4 kg)  02/23/18 188 lb (85.3 kg)  02/19/18 188 lb (85.3 kg)   Temp Readings from Last 3 Encounters:  02/21/18 98.2 F (36.8 C) (Oral)  02/19/18 99.1 F (37.3 C) (Oral)  02/18/18 98.5 F (36.9 C) (Oral)   BP Readings from Last 3 Encounters:  03/01/18 (!) 103/45  02/23/18 122/64  02/21/18 (!) 142/60   Pulse Readings from Last 3 Encounters:  03/01/18 (!) 58  02/23/18 71  02/21/18 69    Physical Exam  Constitutional: He is oriented to person, place, and time. Vital signs are normal. He appears well-developed and well-nourished. He is cooperative.  HENT:  Head: Normocephalic and atraumatic.  Mouth/Throat: Oropharynx is clear and moist and mucous membranes are normal.  Eyes: Pupils are equal, round, and reactive to light. Conjunctivae are normal.  Cardiovascular: Normal rate, regular rhythm and normal heart sounds.  Pulmonary/Chest: Effort normal and breath sounds normal.  Neurological: He is alert and oriented to person, place, and time. Gait normal.  Skin: Skin is warm, dry and intact.  Psychiatric: He has a normal mood and affect. His speech is normal and behavior is normal. Judgment and thought content normal. Cognition and memory are normal.  Nursing note and vitals  reviewed.   Assessment   1. HTN 2. DM 2 A1C 01/27/18 6.1 prior 01/12/17 5.8  3. H/o AMS/delerium C/w memory loss SLUMS 22/30 02/23/18  and MMSE today 28/30 score -reviewed EEG 03/03/18 c/w dementia but no seizure noted durine the time of EEG but does not rule out  4. H/o elevated TSH  5. Leukocytosis  6. HM Plan   1. Cont meds  2. F/u Dr. Gabriel Carina  Pt is only doing SSI Humalog per scale 4x per day and not using Levemir 95 units qd though I prev rec he reduce levimir dose to 80 due to hypoglycemia he and wife ar not taking only SSI for now  Unable to tolerate ACEI/ARB On statin  Ask about eye exam at f/u  Foot exam in future  Given [facility] form to have labs endocrine office urine protein, lipid 3.  CC Dr. Shah for next appt per wife neurology consider add meds  Also will request neuro assess driving ability  4.  [facility] form for tsh, ft3, ft4, and antithyroid abs  5.  [facility] form for CBC repeat  6.  Had flushot pna 23 had 02/2014 UTD prevnar Disc Tdap and shingrix in future   PSAhad nl 10/03/17 0.5 consider DRE in future Colonoscopy had 04/2017 [facility] crohns on mesalamine 2.4 mg qd-negative colonoscopy with multiple bxs Consider repeat DEXA reviewed DEXA +osteoporosis noted 06/07/04 CT chest 01/23/18 + lung nodules  With h/o pulm nodules on CXR and CT chest10/11/16and former smoker [place] [facility] [id number] c/w left temple and right hand Aks.  -H/o skin cancer     Provider: Dr.  McLean-Scocuzza-Internal Medicine  

## 2018-04-03 NOTE — Progress Notes (Signed)
Pre visit review using our clinic review tool, if applicable. No additional management support is needed unless otherwise documented below in the visit note. 

## 2018-04-03 NOTE — Patient Instructions (Addendum)
Results for Chase Cabrera, Chase Cabrera (MRN [id number]) as of 04/03/2018 13:24  Ref. Range 01/12/2018 16:23  Glucose Latest Ref Range: 65 - 99 mg/dL 191 (H)  Hemoglobin A1C Latest Ref Range: 4.8 - 5.6 % 5.8 (H)   Tell neurologist MMSE score today 04/03/18 was 28/30 ask him about driving if safe  Fasting labs 8-10 hours  F/u in 3 months  Get labs at [facility]    Dementia Dementia is the loss of two or more brain functions, such as:  Memory.  Decision making.  Behavior.  Speaking.  Thinking.  Problem solving.  There are many types of dementia. The most common type is called progressive dementia. Progressive dementia gets worse with time and it is irreversible. An example of this type of dementia is Alzheimer disease. What are the causes? This condition may be caused by:  Nerve cell damage in the brain.  Genetic mutations.  Certain medicines.  Multiple small strokes.  An infection, such as chronic meningitis.  A metabolic problem, such as vitamin B12 deficiency or thyroid disease.  Pressure on the brain, such as from a tumor or blood clot.  What are the signs or symptoms? Symptoms of this condition include:  Sudden changes in mood.  Depression.  Problems with balance.  Changes in personality.  Poor short-term memory.  Agitation.  Delusions.  Hallucinations.  Having a hard time: ? Speaking thoughts. ? Finding words. ? Solving problems. ? Doing familiar tasks. ? Understanding familiar ideas.  How is this diagnosed? This condition is diagnosed with an assessment by your health care provider. During this assessment, your health care provider will talk with you and your family, friends, or caregivers about your symptoms. A thorough medical history will be taken, and you will have a physical exam and tests. Tests may include:  Lab tests, such as blood or urine tests.  Imaging tests, such as a CT scan, PET scan, or MRI.  A lumbar puncture. This test  involves removing and testing a small amount of the fluid that surrounds the brain and spinal cord.  An electroencephalogram (EEG). In this test, small metal discs are used to measure electrical activity in the brain.  Memory tests, cognitive tests, and neuropsychological tests. These tests evaluate brain function.  How is this treated? Treatment depends on the cause of the dementia. It may involve taking medicines that may help:  To control the dementia.  To slow down the disease.  To manage symptoms.  In some cases, treating the cause of the dementia can improve symptoms, reverse symptoms, or slow down how quickly the dementia gets worse. Your health care provider can help direct you to support groups, organizations, and other health care providers who can help with decisions about your care. Follow these instructions at home: Medicine  Take over-the-counter and prescription medicines only as told by your health care provider.  Avoid taking medicines that can affect thinking, such as pain or sleeping medicines. Lifestyle   Make healthy lifestyle choices: ? Be physically active as told by your health care provider. ? Do not use any tobacco products, such as cigarettes, chewing tobacco, and e-cigarettes. If you need help quitting, ask your health care provider. ? Eat a healthy diet. ? Practice stress-management techniques when you get stressed. ? Stay social.  Drink enough fluid to keep your urine clear or pale yellow.  Make sure to get quality sleep. These tips can help you to get a good night's rest: ? Avoid napping during the day. ?  Keep your sleeping area dark and cool. ? Avoid exercising during the few hours before you go to bed. ? Avoid caffeine products in the evening. General instructions  Work with your health care provider to determine what you need help with and what your safety needs are.  If you were given a bracelet that tracks your location, make sure to  wear it.  Keep all follow-up visits as told by your health care provider. This is important. Contact a health care provider if:  You have any new symptoms.  You have problems with choking or swallowing.  You have any symptoms of a different illness. Get help right away if:  You develop a fever.  You have new or worsening confusion.  You have new or worsening sleepiness.  You have a hard time staying awake.  You or your family members become concerned for your safety. This information is not intended to replace advice given to you by your health care provider. Make sure you discuss any questions you have with your health care provider. Document Released: 02/23/2001 Document Revised: 01/08/2016 Document Reviewed: 05/28/2015 Elsevier Interactive Patient Education  [employer].

## 2018-04-05 ENCOUNTER — Institutional Professional Consult (permissible substitution): Payer: Medicare Other | Admitting: Pulmonary Disease

## 2018-04-05 ENCOUNTER — Other Ambulatory Visit: Payer: Self-pay | Admitting: Internal Medicine

## 2018-04-05 DIAGNOSIS — I10 Essential (primary) hypertension: Secondary | ICD-10-CM

## 2018-04-05 DIAGNOSIS — I2510 Atherosclerotic heart disease of native coronary artery without angina pectoris: Secondary | ICD-10-CM

## 2018-04-05 MED ORDER — CARVEDILOL 25 MG PO TABS: 25.0000 mg | ORAL_TABLET | Freq: Two times a day (BID) | ORAL | 3 refills | Status: DC

## 2018-04-07 DIAGNOSIS — M7981 Nontraumatic hematoma of soft tissue: Secondary | ICD-10-CM | POA: Diagnosis not present

## 2018-04-07 DIAGNOSIS — R1032 Left lower quadrant pain: Secondary | ICD-10-CM | POA: Diagnosis not present

## 2018-04-07 DIAGNOSIS — E1140 Type 2 diabetes mellitus with diabetic neuropathy, unspecified: Secondary | ICD-10-CM | POA: Diagnosis not present

## 2018-04-07 DIAGNOSIS — I129 Hypertensive chronic kidney disease with stage 1 through stage 4 chronic kidney disease, or unspecified chronic kidney disease: Secondary | ICD-10-CM | POA: Diagnosis not present

## 2018-04-07 DIAGNOSIS — M48061 Spinal stenosis, lumbar region without neurogenic claudication: Secondary | ICD-10-CM | POA: Diagnosis not present

## 2018-04-07 DIAGNOSIS — Z471 Aftercare following joint replacement surgery: Secondary | ICD-10-CM | POA: Diagnosis not present

## 2018-04-09 ENCOUNTER — Encounter: Payer: Self-pay | Admitting: Internal Medicine

## 2018-04-09 MED ORDER — AMLODIPINE BESYLATE 5 MG PO TABS: 5.0000 mg | ORAL_TABLET | Freq: Two times a day (BID) | ORAL | 3 refills | Status: DC

## 2018-04-10 NOTE — Progress Notes (Signed)
Note faxed electronically.

## 2018-04-11 DIAGNOSIS — M7981 Nontraumatic hematoma of soft tissue: Secondary | ICD-10-CM | POA: Diagnosis not present

## 2018-04-11 DIAGNOSIS — Z471 Aftercare following joint replacement surgery: Secondary | ICD-10-CM | POA: Diagnosis not present

## 2018-04-11 DIAGNOSIS — R1032 Left lower quadrant pain: Secondary | ICD-10-CM | POA: Diagnosis not present

## 2018-04-11 DIAGNOSIS — M48061 Spinal stenosis, lumbar region without neurogenic claudication: Secondary | ICD-10-CM | POA: Diagnosis not present

## 2018-04-11 DIAGNOSIS — E1140 Type 2 diabetes mellitus with diabetic neuropathy, unspecified: Secondary | ICD-10-CM | POA: Diagnosis not present

## 2018-04-11 DIAGNOSIS — I129 Hypertensive chronic kidney disease with stage 1 through stage 4 chronic kidney disease, or unspecified chronic kidney disease: Secondary | ICD-10-CM | POA: Diagnosis not present

## 2018-04-14 ENCOUNTER — Ambulatory Visit (INDEPENDENT_AMBULATORY_CARE_PROVIDER_SITE_OTHER): Payer: Medicare Other

## 2018-04-14 VITALS — BP 136/68 | HR 63 | Temp 98.0°F | Resp 16 | Ht 66.5 in | Wt 191.1 lb

## 2018-04-14 DIAGNOSIS — I129 Hypertensive chronic kidney disease with stage 1 through stage 4 chronic kidney disease, or unspecified chronic kidney disease: Secondary | ICD-10-CM | POA: Diagnosis not present

## 2018-04-14 DIAGNOSIS — Z471 Aftercare following joint replacement surgery: Secondary | ICD-10-CM | POA: Diagnosis not present

## 2018-04-14 DIAGNOSIS — R1032 Left lower quadrant pain: Secondary | ICD-10-CM | POA: Diagnosis not present

## 2018-04-14 DIAGNOSIS — M48061 Spinal stenosis, lumbar region without neurogenic claudication: Secondary | ICD-10-CM | POA: Diagnosis not present

## 2018-04-14 DIAGNOSIS — Z0000 Encounter for general adult medical examination without abnormal findings: Secondary | ICD-10-CM

## 2018-04-14 DIAGNOSIS — E1140 Type 2 diabetes mellitus with diabetic neuropathy, unspecified: Secondary | ICD-10-CM | POA: Diagnosis not present

## 2018-04-14 DIAGNOSIS — M7981 Nontraumatic hematoma of soft tissue: Secondary | ICD-10-CM | POA: Diagnosis not present

## 2018-04-14 NOTE — Patient Instructions (Addendum)
  Mr. Chase Cabrera , Thank you for taking time to come for your Medicare Wellness Visit. I appreciate your ongoing commitment to your health goals. Please review the following plan we discussed and let me know if I can assist you in the future.   These are the goals we discussed: Goals    . Increase physical activity     Use stationary exercise bike, 3 days a week, as tolerated.        This is a list of the screening recommended for you and due dates:  Health Maintenance  Topic Date Due  . Complete foot exam   01/20/2017  . Eye exam for diabetics  07/08/2017  . Flu Shot  04/13/2018  . Hemoglobin A1C  07/15/2018  . Tetanus Vaccine  09/14/2023  . Colon Cancer Screening  04/14/2027  .  Hepatitis C: One time screening is recommended by Center for Disease Control  (CDC) for  adults born from [birth year] through 1967.   Completed  . Pneumonia vaccines  Addressed

## 2018-04-14 NOTE — Progress Notes (Signed)
Subjective:   Chase Cabrera is a 72 y.o. male who presents for an Initial Medicare Annual Wellness Visit.  Review of Systems  No ROS.  Medicare Wellness Visit. Additional risk factors are reflected in the social history.  Cardiac Risk Factors include: advanced age (>96mn, >>38women);hypertension;male gender;diabetes mellitus;obesity (BMI >30kg/m2)    Objective:    Today's Vitals   04/14/18 1533  BP: 136/68  Pulse: 63  Resp: 16  Temp: 98 F (36.7 C)  TempSrc: Oral  SpO2: 94%  Weight: 191 lb 1.9 oz (86.7 kg)  Height: 5' 6.5" (1.689 m)   Body mass index is 30.39 kg/m.  Advanced Directives 04/14/2018 02/19/2018 02/09/2018 01/28/2018 12/13/2017 12/13/2017 11/16/2017  Does Patient Have a Medical Advance Directive? Yes Yes Yes No Yes Yes Yes  Type of AParamedicof [place]Living will - [place] will - Living will;Healthcare Power of [place]Living will [place]win For[place] will  Does patient want to make changes to medical advance directive? No - Patient declined - No - Patient declined - No - Patient declined - -  Copy of [place] Chart? Yes - Yes - No - copy requested No - copy requested Yes  Would patient like information on creating a medical advance directive? - - - - No - Patient declined (No Data) -    Current Medications (verified) Outpatient Encounter Medications as of 04/14/2018  Medication Sig  . amLODipine (NORVASC) 5 MG tablet Take 1 tablet (5 mg total) by mouth 2 (two) times daily. If blood pressure >140/>90 make take another 1 pill of 5 mg  . carvedilol (COREG) 25 MG tablet Take 1 tablet (25 mg total) by mouth 2 (two) times daily with a meal.  . Cholecalciferol 50000 units TABS Take 1 tablet by mouth once a week. (Patient taking differently: Take 50,000 Units by mouth every Saturday. )  . cholestyramine light (PREVALITE) 4 g packet Take 4 g by mouth every  morning.   . clopidogrel (PLAVIX) 75 MG tablet Take 75 mg by mouth daily.  . colchicine 0.6 MG tablet TAKE 1 TABLET BY MOUTH ONCE DAILY (Patient taking differently: TAKE 0.6 MG BY MOUTH ONCE DAILY)  . diclofenac sodium (VOLTAREN) 1 % GEL Apply 2 g topically 4 (four) times daily as needed (for pain).   .Marland Kitchendicyclomine (BENTYL) 10 MG capsule Take 10 mg by mouth 3 (three) times daily before meals.   . fluticasone (FLONASE) 50 MCG/ACT nasal spray Place 2 sprays into both nostrils daily.  .Marland Kitchengabapentin (NEURONTIN) 300 MG capsule take 2 capsules (6067m by mouth three times a day  . hydrALAZINE (APRESOLINE) 100 MG tablet TAKE 100 MG BY MOUTH THREE TIMES A DAY  . HYDROcodone-acetaminophen (NORCO) 5-325 MG tablet Take 1 tablet by mouth every 4 (four) hours as needed for severe pain (try not to take more than 2 a day.).  . Marland Kitchennsulin lispro (HUMALOG) 100 UNIT/ML KiwkPen Inject 14-32u under the skin three times daily before meals according to sliding scale  . ipratropium (ATROVENT) 0.06 % nasal spray Place 2 sprays into both nostrils 4 (four) times daily.  . isosorbide mononitrate (IMDUR) 120 MG 24 hr tablet TAKE 1 TABLET BY MOUTH EVERY DAY  . LEVEMIR FLEXTOUCH 100 UNIT/ML Pen Inject 80 Units into the skin daily at 10 pm.  . levocetirizine (XYZAL) 5 MG tablet Take 1 tablet (5 mg total) by mouth every evening.  . metFORMIN (GLUCOPHAGE) 1000 MG  tablet Take 1,000 mg by mouth 2 (two) times daily with a meal.  . montelukast (SINGULAIR) 10 MG tablet Take 1 tablet (10 mg total) by mouth at bedtime.  . Multiple Vitamin (MULTI-VITAMINS) TABS Take 1 tablet by mouth daily.  . nitroGLYCERIN (NITROSTAT) 0.4 MG SL tablet Place 1 tablet (0.4 mg total) under the tongue every 5 (five) minutes x 3 doses as needed. For chest pain.  Call 911 if no relief after 3 tablets  . omeprazole (PRILOSEC) 40 MG capsule Take 40 mg by mouth 2 (two) times daily.   . pravastatin (PRAVACHOL) 40 MG tablet Take 1 tablet (40 mg total) by mouth daily.    . probenecid (BENEMID) 500 MG tablet Take 500 mg by mouth 2 (two) times daily.   . vitamin C (ASCORBIC ACID) 500 MG tablet Take 500 mg by mouth daily.   . Vitamin E 400 units TABS Take 400 Units by mouth 2 (two) times daily.  . [DISCONTINUED] silodosin (RAPAFLO) 8 MG CAPS capsule Take 1 capsule (8 mg total) by mouth daily with breakfast.   No facility-administered encounter medications on file as of 04/14/2018.     Allergies (verified) Allopurinol; Atenolol; Atorvastatin; Ramipril; Rosuvastatin; Simvastatin; Valsartan; and Cardizem [diltiazem]   History: Past Medical History:  Diagnosis Date  . Arthritis   . Atrial fibrillation ([place])   . CAD (coronary artery disease)    6 STENTS  . CHF (congestive heart failure) ([place])   . Chicken pox   . Colon polyp   . Corneal dystrophy    bilateral  . Crohn's disease ([place])   . Diabetes ([place]) 2002  . Dysrhythmia    A-FIB AND PAF  . GERD (gastroesophageal reflux disease)   . Gout   . History of kidney stones   . History of shingles   . Hyperlipemia   . Hypertension   . Kidney stones   . Myocardial infarction ([place])    x4, last one in 2010  . Peripheral neuropathy   . Peripheral neuropathy   . Peripheral neuropathy   . PUD (peptic ulcer disease)   . Rectus sheath hematoma    02/18/18 8.9 x 4.5 cm then 02/19/18 7.9 x 4.5 cm   . Renal artery stenosis ([place])   . Renal artery stenosis ([place])   . Salzmann's nodular dystrophy 2012  . Sleep apnea   . Spinal stenosis    Past Surgical History:  Procedure Laterality Date  . ABLATION  2012  . APPLICATION VERTERBRAL DEFECT PROSTHETIC  05/01/2013  . ARTHRODESIS ANTERIOR LUMBAR SPINE  05/01/2013  . BACK SURGERY     lumbar fusion  . CARDIAC CATHETERIZATION    . CARDIAC ELECTROPHYSIOLOGY STUDY AND ABLATION    . CARDIAC SURGERY    . CARDIOVERSION    . CHOLECYSTECTOMY    . COLONOSCOPY WITH PROPOFOL N/A 04/09/2016   Completed for chronic diarrhea.  Few scattered diverticuli.  No mucosal lesions.   Surgeon: Lollie Sails, MD;  Location[facility] ENDOSCOPY;  Service: Endoscopy;  Laterality: N/A;  . CORNEAL EYE SURGERY Bilateral 07/28/11    09/22/2011  . CORONARY ANGIOPLASTY    . CORONARY ANGIOPLASTY WITH STENT PLACEMENT  03/28/2015   Distal 80% to normal Stent, Dilation Balloon  . CORONARY ARTERY BYPASS GRAFT     4 VESSELS  . EYE SURGERY     bilateral cataract 2017 Dr. Megan Mans  . foot and ankle repair Right 2005  . FRACTURE SURGERY     ANKLE PLATE AND SCREWS  .  GALLBLADER    . JOINT REPLACEMENT     left total hip 11/11/15  . JOINT REPLACEMENT     right total hip 12/2017 Dr. Rudene Christians   . KNEE ARTHROSCOPY Right   . LUMBAR SPINE FUSION ONE LEVEL  05/03/2013  . RENAL ARTERY STENT Right   . ROTATOR CUFF REPAIR Right 2006  . TONSILLECTOMY    . TOTAL HIP ARTHROPLASTY Left 11/11/2015   Procedure: TOTAL HIP ARTHROPLASTY ANTERIOR APPROACH;  Surgeon: Hessie Knows, MD;  Location: [facility] ORS;  Service: Orthopedics;  Laterality: Left;  . TOTAL HIP ARTHROPLASTY Right 12/13/2017   Procedure: TOTAL HIP ARTHROPLASTY ANTERIOR APPROACH;  Surgeon: Hessie Knows, MD;  Location: [facility] ORS;  Service: Orthopedics;  Laterality: Right;  . TRANSCATH PLACEMENT INTRAVASCULAR STENT LEG  03/2015   Family History  Problem Relation Age of Onset  . CVA Father   . Stroke Father   . Lung cancer Mother   . Arthritis Mother   . Stomach cancer Sister   . Colon cancer Sister   . Diabetes Brother    Social History   Socioeconomic History  . Marital status: Married    Spouse name: Not on file  . Number of children: Not on file  . Years of education: Not on file  . Highest education level: Not on file  Occupational History  . Not on file  Social Needs  . Financial resource strain: Not very hard  . Food insecurity:    Worry: Never true    Inability: Never true  . Transportation needs:    Medical: No    Non-medical: No  Tobacco Use  . Smoking status: Former Smoker    Packs/day: 1.00    Years: 3.00    Pack  years: 3.00    Types: Cigarettes    Last attempt to quit: 06/30/1962    Years since quitting: 55.8  . Smokeless tobacco: Former Systems developer    Types: [place] date: 12/05/1968  Substance and Sexual Activity  . Alcohol use: No  . Drug use: No  . Sexual activity: Not Currently  Lifestyle  . Physical activity:    Days per week: 2 days    Minutes per session: 30 min  . Stress: Not at all  Relationships  . Social connections:    Talks on phone: Not on file    Gets together: Not on file    Attends religious service: Not on file    Active member of club or organization: Not on file    Attends meetings of clubs or organizations: Not on file    Relationship status: Not on file  Other Topics Concern  . Not on file  Social History Narrative   Married    Tobacco Counseling Counseling given: Not Answered   Clinical Intake:  Pre-visit preparation completed: Yes  Pain : No/denies pain     Nutritional Status: BMI > 30  Obese Diabetes: Yes(Followed by Endocrinologist, Melissa Solum)  How often do you need to have someone help you when you read instructions, pamphlets, or other written materials from your doctor or pharmacy?: 3 - Sometimes  Interpreter Needed?: No     Activities of Daily Living In your present state of health, do you have any difficulty performing the following activities: 04/14/2018 02/09/2018  Hearing? Y Y  Comment Hearing aid, bilateral hearing aides at home, pt will not wear them   Vision? N N  Difficulty concentrating or making decisions? Y Y  Comment Followed by neurology  and pcp.  -  Walking or climbing stairs? Y Y  Comment Unsteady gait. Impaired mobility. Walker in use when ambulating.  uses electric scooter, will not use his walker   Dressing or bathing? N N  Doing errands, shopping? Y [place] and eating ? Y -  Comment Wife peprares  -  Using the Toilet? N -  In the past six months, have you accidently leaked urine? Y -    Comment Managed with a brief as needed. Followed by Urology and pcp.  -  Do you have problems with loss of bowel control? Y -  Comment Intermittent. Followed by GI and pcp. -  Managing your Medications? Y -  Comment Wife assists -  Managing your Finances? Y -  Comment Wife assists -  Housekeeping or managing your Housekeeping? Y -  Comment Wife assists -  Some recent data might be hidden     Immunizations and Health Maintenance Immunization History  Administered Date(s) Administered  . Influenza, High Dose Seasonal PF 08/09/2017  . Influenza-Unspecified 07/02/2013, 07/04/2014, 07/14/2016  . Pneumococcal Conjugate-13 10/03/2017  . Pneumococcal Polysaccharide-23 03/08/2014   Health Maintenance Due  Topic Date Due  . FOOT EXAM  01/20/2017  . OPHTHALMOLOGY EXAM  07/08/2017  . INFLUENZA VACCINE  04/13/2018    Patient Care Team: McLean-Scocuzza, Nino Glow, MD as PCP - General (Internal Medicine) Bary Castilla, Forest Gleason, MD (General Surgery) Lollie Sails, MD as Consulting Physician (Gastroenterology)  Indicate any recent Medical Services you may have received from other than [facility] providers in the past year (date may be approximate).    Assessment:   This is a routine wellness examination for [place].  The goal of the wellness visit is to assist the patient how to close the gaps in care and create a preventative care plan for the patient. Wife presents with patient today, HIPAA compliant. Information collected from both.   He is oriented to person, place and time. Normal in appearance.  Appropriate behavior.   The roster of all physicians providing medical care to patient is listed in the Snapshot section of the chart.  Osteoporosis risk reviewed.    Safety issues reviewed; Smoke and carbon monoxide detectors in the home. No firearms in the home. Wears seatbelts when riding with others.He no longer drives. No violence in the home.  They do not have excessive sun exposure.   Discussed the need for sun protection: hats, long sleeves and the use of sunscreen if there is significant sun exposure.  Ambulates with walker.   BMI- discussed the importance of a healthy diet, water intake and the benefits of aerobic exercise. Home physical therapy sessions of 10 weeks, BID end today.  Wife would like to have sessions continued.  Deferred to pcp for follow up.  Encouraged to use stationary exercise bike as tolerated. Wife prepares the meals and tries to fix a healthy low carb diet.   Sleep patterns- Sleeps without issues.   Patient Concerns:Awaiting urology appointment. Wife states they have been waiting for urology to call with an appointment date for several weeks. Wife plans to call for follow up.  Hearing/Vision screen Hearing Screening Comments: Followed by [facility] Hearing aid, bilateral Vision Screening Comments: Followed by [facility] Wears corrective lenses Last OV 2018 Cataract extraction, bilateral Visual acuity not assessed per patient preference since they have regular follow up with the ophthalmologist  Dietary issues and exercise activities discussed: Current Exercise Habits: Home exercise  routine, Type of exercise: walking;calisthenics, Time (Minutes): 30, Frequency (Times/Week): 2, Weekly Exercise (Minutes/Week): 60, Intensity: Mild  Goals    . Increase physical activity     Use stationary exercise bike, 3 days a week, as tolerated.       Depression Screen PHQ 2/9 Scores 04/14/2018 02/09/2018 10/18/2017 09/09/2016  PHQ - 2 Score 0 0 0 0  PHQ- 9 Score - - - -    Fall Risk Fall Risk  04/14/2018 02/09/2018 01/12/2018 10/18/2017 09/09/2016  Falls in the past year? Yes Yes Yes Yes No  Number falls in past yr: 1 2 or more 2 or more 2 or more -  Comment about 4 months ago - - - -  Injury with Fall? - - Yes No -  Risk Factor Category  High Fall Risk High Fall Risk High Fall Risk High Fall Risk -  Risk for fall due to : Impaired balance/gait;History  of fall(s) - - - -  Follow up Falls prevention discussed;Education provided - - - -   Cognitive Function: MMSE - Mini Mental State Exam 04/14/2018  Not completed: Refused        Screening Tests Health Maintenance  Topic Date Due  . FOOT EXAM  01/20/2017  . OPHTHALMOLOGY EXAM  07/08/2017  . INFLUENZA VACCINE  04/13/2018  . HEMOGLOBIN A1C  07/15/2018  . TETANUS/TDAP  09/14/2023  . COLONOSCOPY  04/14/2027  . Hepatitis C Screening  Completed  . PNA vac Low Risk Adult  Addressed      Plan:   End of life planning; Advance aging; Advanced directives discussed. Copy of current HCPOA/Living Will on file.    I have personally reviewed and noted the following in the patient's chart:   . Medical and social history . Use of alcohol, tobacco or illicit drugs  . Current medications and supplements . Functional ability and status . Nutritional status . Physical activity . Advanced directives . List of other physicians . Hospitalizations, surgeries, and ER visits in previous 12 months . Vitals . Screenings to include cognitive, depression, and falls . Referrals and appointments  In addition, I have reviewed and discussed with patient certain preventive protocols, quality metrics, and best practice recommendations. A written personalized care plan for preventive services as well as general preventive health recommendations were provided to patient.     Varney Biles, LPN   04/16/939

## 2018-04-19 ENCOUNTER — Other Ambulatory Visit
Admission: RE | Admit: 2018-04-19 | Discharge: 2018-04-19 | Disposition: A | Discharge status: Home / Self Care | Payer: Medicare Other | Source: Ambulatory Visit | Attending: Internal Medicine | Admitting: Internal Medicine

## 2018-04-19 DIAGNOSIS — D649 Anemia, unspecified: Secondary | ICD-10-CM | POA: Diagnosis not present

## 2018-04-19 DIAGNOSIS — R946 Abnormal results of thyroid function studies: Secondary | ICD-10-CM | POA: Diagnosis not present

## 2018-04-19 DIAGNOSIS — E785 Hyperlipidemia, unspecified: Secondary | ICD-10-CM | POA: Insufficient documentation

## 2018-04-19 DIAGNOSIS — I10 Essential (primary) hypertension: Secondary | ICD-10-CM | POA: Diagnosis present

## 2018-04-19 LAB — CBC WITH DIFFERENTIAL/PLATELET
BASOS ABS: 0 10*3/uL (ref 0–0.1)
Basophils Relative: 1 %
EOS ABS: 0.3 10*3/uL (ref 0–0.7)
EOS PCT: 4 %
HCT: 37.8 % — ABNORMAL LOW (ref 40.0–52.0)
Hemoglobin: 13 g/dL (ref 13.0–18.0)
Lymphocytes Relative: 19 %
Lymphs Abs: 1.3 10*3/uL (ref 1.0–3.6)
MCH: 32.2 pg (ref 26.0–34.0)
MCHC: 34.4 g/dL (ref 32.0–36.0)
MCV: 93.7 fL (ref 80.0–100.0)
MONO ABS: 0.6 10*3/uL (ref 0.2–1.0)
Monocytes Relative: 9 %
Neutro Abs: 4.8 10*3/uL (ref 1.4–6.5)
Neutrophils Relative %: 67 %
PLATELETS: 239 10*3/uL (ref 150–440)
RBC: 4.04 MIL/uL — AB (ref 4.40–5.90)
RDW: 16 % — AB (ref 11.5–14.5)
WBC: 7 10*3/uL (ref 3.8–10.6)

## 2018-04-19 LAB — LIPID PANEL
CHOL/HDL RATIO: 4.9 ratio
Cholesterol: 156 mg/dL (ref 0–200)
HDL: 32 mg/dL — AB (ref >40)
LDL CALC: UNDETERMINED mg/dL (ref 0–99)
TRIGLYCERIDES: 404 mg/dL — AB (ref <150)
VLDL: UNDETERMINED mg/dL (ref 0–40)

## 2018-04-19 LAB — T4, FREE: FREE T4: 0.71 ng/dL — AB (ref 0.82–1.77)

## 2018-04-19 LAB — TSH: TSH: 3.034 u[IU]/mL (ref 0.350–4.500)

## 2018-04-20 LAB — THYROID PEROXIDASE ANTIBODY: THYROID PEROXIDASE ANTIBODY: 14 [IU]/mL (ref 0–34)

## 2018-04-20 LAB — T3, FREE: T3 FREE: 2.6 pg/mL (ref 2.0–4.4)

## 2018-04-23 ENCOUNTER — Other Ambulatory Visit: Payer: Self-pay | Admitting: Internal Medicine

## 2018-04-23 DIAGNOSIS — I2510 Atherosclerotic heart disease of native coronary artery without angina pectoris: Secondary | ICD-10-CM

## 2018-04-23 DIAGNOSIS — E785 Hyperlipidemia, unspecified: Secondary | ICD-10-CM

## 2018-04-23 MED ORDER — EZETIMIBE 10 MG PO TABS: 10.0000 mg | ORAL_TABLET | Freq: Every day | ORAL | 3 refills | Status: DC

## 2018-04-25 DIAGNOSIS — E1159 Type 2 diabetes mellitus with other circulatory complications: Secondary | ICD-10-CM | POA: Diagnosis not present

## 2018-04-25 DIAGNOSIS — E1142 Type 2 diabetes mellitus with diabetic polyneuropathy: Secondary | ICD-10-CM | POA: Diagnosis not present

## 2018-04-25 DIAGNOSIS — E1129 Type 2 diabetes mellitus with other diabetic kidney complication: Secondary | ICD-10-CM | POA: Diagnosis not present

## 2018-04-25 DIAGNOSIS — R809 Proteinuria, unspecified: Secondary | ICD-10-CM | POA: Diagnosis not present

## 2018-04-25 DIAGNOSIS — Z794 Long term (current) use of insulin: Secondary | ICD-10-CM | POA: Diagnosis not present

## 2018-04-30 ENCOUNTER — Observation Stay: Payer: Medicare Other

## 2018-04-30 ENCOUNTER — Emergency Department: Payer: Medicare Other

## 2018-04-30 ENCOUNTER — Other Ambulatory Visit: Payer: Self-pay

## 2018-04-30 ENCOUNTER — Observation Stay
Admission: EM | Admit: 2018-04-30 | Discharge: 2018-05-02 | Disposition: A | Discharge status: Home / Self Care | Payer: Medicare Other | Attending: Internal Medicine | Admitting: Internal Medicine

## 2018-04-30 DIAGNOSIS — G459 Transient cerebral ischemic attack, unspecified: Secondary | ICD-10-CM | POA: Diagnosis not present

## 2018-04-30 DIAGNOSIS — E785 Hyperlipidemia, unspecified: Secondary | ICD-10-CM | POA: Diagnosis not present

## 2018-04-30 DIAGNOSIS — I252 Old myocardial infarction: Secondary | ICD-10-CM | POA: Insufficient documentation

## 2018-04-30 DIAGNOSIS — Z955 Presence of coronary angioplasty implant and graft: Secondary | ICD-10-CM | POA: Insufficient documentation

## 2018-04-30 DIAGNOSIS — M1990 Unspecified osteoarthritis, unspecified site: Secondary | ICD-10-CM | POA: Insufficient documentation

## 2018-04-30 DIAGNOSIS — Z79899 Other long term (current) drug therapy: Secondary | ICD-10-CM | POA: Diagnosis not present

## 2018-04-30 DIAGNOSIS — Z7902 Long term (current) use of antithrombotics/antiplatelets: Secondary | ICD-10-CM | POA: Insufficient documentation

## 2018-04-30 DIAGNOSIS — I10 Essential (primary) hypertension: Secondary | ICD-10-CM

## 2018-04-30 DIAGNOSIS — I2510 Atherosclerotic heart disease of native coronary artery without angina pectoris: Secondary | ICD-10-CM | POA: Diagnosis not present

## 2018-04-30 DIAGNOSIS — Z96643 Presence of artificial hip joint, bilateral: Secondary | ICD-10-CM | POA: Diagnosis not present

## 2018-04-30 DIAGNOSIS — I480 Paroxysmal atrial fibrillation: Secondary | ICD-10-CM | POA: Insufficient documentation

## 2018-04-30 DIAGNOSIS — Z7901 Long term (current) use of anticoagulants: Secondary | ICD-10-CM | POA: Diagnosis not present

## 2018-04-30 DIAGNOSIS — I110 Hypertensive heart disease with heart failure: Secondary | ICD-10-CM | POA: Insufficient documentation

## 2018-04-30 DIAGNOSIS — Z8619 Personal history of other infectious and parasitic diseases: Secondary | ICD-10-CM | POA: Diagnosis not present

## 2018-04-30 DIAGNOSIS — H539 Unspecified visual disturbance: Secondary | ICD-10-CM | POA: Diagnosis not present

## 2018-04-30 DIAGNOSIS — K219 Gastro-esophageal reflux disease without esophagitis: Secondary | ICD-10-CM | POA: Diagnosis not present

## 2018-04-30 DIAGNOSIS — Z96653 Presence of artificial knee joint, bilateral: Secondary | ICD-10-CM | POA: Insufficient documentation

## 2018-04-30 DIAGNOSIS — E119 Type 2 diabetes mellitus without complications: Secondary | ICD-10-CM | POA: Diagnosis not present

## 2018-04-30 DIAGNOSIS — R51 Headache: Secondary | ICD-10-CM | POA: Diagnosis not present

## 2018-04-30 DIAGNOSIS — M109 Gout, unspecified: Secondary | ICD-10-CM | POA: Diagnosis not present

## 2018-04-30 DIAGNOSIS — R200 Anesthesia of skin: Secondary | ICD-10-CM | POA: Insufficient documentation

## 2018-04-30 DIAGNOSIS — H02402 Unspecified ptosis of left eyelid: Secondary | ICD-10-CM | POA: Insufficient documentation

## 2018-04-30 DIAGNOSIS — I6521 Occlusion and stenosis of right carotid artery: Secondary | ICD-10-CM | POA: Diagnosis not present

## 2018-04-30 DIAGNOSIS — E1140 Type 2 diabetes mellitus with diabetic neuropathy, unspecified: Secondary | ICD-10-CM | POA: Insufficient documentation

## 2018-04-30 DIAGNOSIS — Z8679 Personal history of other diseases of the circulatory system: Secondary | ICD-10-CM | POA: Diagnosis not present

## 2018-04-30 LAB — PROTIME-INR
INR: 1.04
Prothrombin Time: 13.5 seconds (ref 11.4–15.2)

## 2018-04-30 LAB — COMPREHENSIVE METABOLIC PANEL
ALBUMIN: 4.1 g/dL (ref 3.5–5.0)
ALK PHOS: 92 U/L (ref 38–126)
ALT: 32 U/L (ref 0–44)
AST: 41 U/L (ref 15–41)
Anion gap: 12 (ref 5–15)
BILIRUBIN TOTAL: 0.7 mg/dL (ref 0.3–1.2)
BUN: 23 mg/dL (ref 8–23)
CALCIUM: 9.4 mg/dL (ref 8.9–10.3)
CO2: 22 mmol/L (ref 22–32)
Chloride: 103 mmol/L (ref 98–111)
Creatinine, Ser: 0.96 mg/dL (ref 0.61–1.24)
GFR calc Af Amer: >60 mL/min (ref >60)
GFR calc non Af Amer: >60 mL/min (ref >60)
GLUCOSE: 192 mg/dL — AB (ref 70–99)
Potassium: 4 mmol/L (ref 3.5–5.1)
SODIUM: 137 mmol/L (ref 135–145)
TOTAL PROTEIN: 8 g/dL (ref 6.5–8.1)

## 2018-04-30 LAB — GLUCOSE, CAPILLARY
GLUCOSE-CAPILLARY: 99 mg/dL (ref 70–99)
Glucose-Capillary: 183 mg/dL — ABNORMAL HIGH (ref 70–99)

## 2018-04-30 LAB — APTT: APTT: 30 s (ref 24–36)

## 2018-04-30 LAB — DIFFERENTIAL
BASOS ABS: 0 10*3/uL (ref 0–0.1)
Basophils Relative: 1 %
Eosinophils Absolute: 0.3 10*3/uL (ref 0–0.7)
Eosinophils Relative: 3 %
LYMPHS ABS: 1.6 10*3/uL (ref 1.0–3.6)
LYMPHS PCT: 20 %
Monocytes Absolute: 0.8 10*3/uL (ref 0.2–1.0)
Monocytes Relative: 10 %
NEUTROS ABS: 5.4 10*3/uL (ref 1.4–6.5)
NEUTROS PCT: 66 %

## 2018-04-30 LAB — CBC
HCT: 37.7 % — ABNORMAL LOW (ref 40.0–52.0)
Hemoglobin: 13.1 g/dL (ref 13.0–18.0)
MCH: 33.2 pg (ref 26.0–34.0)
MCHC: 34.7 g/dL (ref 32.0–36.0)
MCV: 95.7 fL (ref 80.0–100.0)
PLATELETS: 249 10*3/uL (ref 150–440)
RBC: 3.94 MIL/uL — AB (ref 4.40–5.90)
RDW: 16.1 % — ABNORMAL HIGH (ref 11.5–14.5)
WBC: 8.1 10*3/uL (ref 3.8–10.6)

## 2018-04-30 LAB — HEMOGLOBIN A1C
HEMOGLOBIN A1C: 7.1 % — AB (ref 4.8–5.6)
MEAN PLASMA GLUCOSE: 157.07 mg/dL

## 2018-04-30 LAB — TROPONIN I: Troponin I: <0.03 ng/mL (ref <0.03)

## 2018-04-30 MED ORDER — ASPIRIN 325 MG PO TABS
325.0000 mg | ORAL_TABLET | ORAL | Status: AC
  Administered 2018-04-30: 325 mg via ORAL
  Filled 2018-04-30: qty 1

## 2018-04-30 MED ORDER — AMLODIPINE BESYLATE 5 MG PO TABS
5.0000 mg | ORAL_TABLET | Freq: Two times a day (BID) | ORAL | Status: DC
  Administered 2018-05-01 – 2018-05-02 (×4): 5 mg via ORAL
  Filled 2018-04-30 (×5): qty 1

## 2018-04-30 MED ORDER — INSULIN ASPART 100 UNIT/ML ~~LOC~~ SOLN: 0.0000 [IU] | Freq: Every day | SUBCUTANEOUS | Status: DC

## 2018-04-30 MED ORDER — LORATADINE 10 MG PO TABS
10.0000 mg | ORAL_TABLET | Freq: Every day | ORAL | Status: DC
  Administered 2018-05-01: 10 mg via ORAL
  Filled 2018-04-30: qty 1

## 2018-04-30 MED ORDER — LEVOCETIRIZINE DIHYDROCHLORIDE 5 MG PO TABS: 5.0000 mg | ORAL_TABLET | Freq: Every evening | ORAL | Status: DC

## 2018-04-30 MED ORDER — CHOLESTYRAMINE LIGHT 4 G PO PACK
4.0000 g | PACK | Freq: Every day | ORAL | Status: DC
  Administered 2018-05-01: 12:00:00 4 g via ORAL
  Filled 2018-04-30 (×2): qty 1

## 2018-04-30 MED ORDER — HYDROCODONE-ACETAMINOPHEN 5-325 MG PO TABS: 1.0000 | ORAL_TABLET | ORAL | Status: DC | PRN

## 2018-04-30 MED ORDER — CLOPIDOGREL BISULFATE 75 MG PO TABS
75.0000 mg | ORAL_TABLET | Freq: Every day | ORAL | Status: DC
  Administered 2018-05-01 – 2018-05-02 (×2): 75 mg via ORAL
  Filled 2018-04-30 (×3): qty 1

## 2018-04-30 MED ORDER — NITROGLYCERIN 0.4 MG SL SUBL: 0.4000 mg | SUBLINGUAL_TABLET | SUBLINGUAL | Status: DC | PRN

## 2018-04-30 MED ORDER — ADULT MULTIVITAMIN W/MINERALS CH
1.0000 | ORAL_TABLET | Freq: Every day | ORAL | Status: DC
  Administered 2018-05-01 – 2018-05-02 (×2): 1 via ORAL
  Filled 2018-04-30 (×3): qty 1

## 2018-04-30 MED ORDER — PROBENECID 500 MG PO TABS
500.0000 mg | ORAL_TABLET | Freq: Two times a day (BID) | ORAL | Status: DC
  Administered 2018-05-01 – 2018-05-02 (×4): 500 mg via ORAL
  Filled 2018-04-30 (×5): qty 1

## 2018-04-30 MED ORDER — PRAVASTATIN SODIUM 20 MG PO TABS
40.0000 mg | ORAL_TABLET | Freq: Every day | ORAL | Status: DC
  Administered 2018-05-01 (×2): 40 mg via ORAL
  Filled 2018-04-30: qty 2
  Filled 2018-04-30: qty 1
  Filled 2018-04-30: qty 2

## 2018-04-30 MED ORDER — PANTOPRAZOLE SODIUM 40 MG PO TBEC
40.0000 mg | DELAYED_RELEASE_TABLET | Freq: Two times a day (BID) | ORAL | Status: DC
  Administered 2018-05-01 – 2018-05-02 (×4): 40 mg via ORAL
  Filled 2018-04-30 (×5): qty 1

## 2018-04-30 MED ORDER — MONTELUKAST SODIUM 10 MG PO TABS
10.0000 mg | ORAL_TABLET | Freq: Every day | ORAL | Status: DC
  Administered 2018-05-01 (×2): 10 mg via ORAL
  Filled 2018-04-30 (×2): qty 1

## 2018-04-30 MED ORDER — INSULIN GLARGINE 100 UNIT/ML ~~LOC~~ SOLN
70.0000 [IU] | Freq: Every day | SUBCUTANEOUS | Status: DC
  Filled 2018-04-30 (×3): qty 0.7

## 2018-04-30 MED ORDER — CARVEDILOL 25 MG PO TABS
25.0000 mg | ORAL_TABLET | Freq: Two times a day (BID) | ORAL | Status: DC
  Administered 2018-05-01 – 2018-05-02 (×3): 25 mg via ORAL
  Filled 2018-04-30 (×4): qty 1

## 2018-04-30 MED ORDER — DICYCLOMINE HCL 10 MG PO CAPS
10.0000 mg | ORAL_CAPSULE | Freq: Three times a day (TID) | ORAL | Status: DC
  Administered 2018-05-01 – 2018-05-02 (×4): 10 mg via ORAL
  Filled 2018-04-30 (×6): qty 1

## 2018-04-30 MED ORDER — ENOXAPARIN SODIUM 40 MG/0.4ML ~~LOC~~ SOLN
40.0000 mg | SUBCUTANEOUS | Status: DC
  Administered 2018-05-01 (×2): 40 mg via SUBCUTANEOUS
  Filled 2018-04-30 (×2): qty 0.4

## 2018-04-30 MED ORDER — DICLOFENAC SODIUM 1 % TD GEL
2.0000 g | Freq: Four times a day (QID) | TRANSDERMAL | Status: DC | PRN
  Filled 2018-04-30: qty 100

## 2018-04-30 MED ORDER — STROKE: EARLY STAGES OF RECOVERY BOOK
Freq: Once | Status: AC
  Administered 2018-05-01: 01:00:00

## 2018-04-30 MED ORDER — GABAPENTIN 300 MG PO CAPS
600.0000 mg | ORAL_CAPSULE | Freq: Three times a day (TID) | ORAL | Status: DC
  Administered 2018-05-01 – 2018-05-02 (×6): 600 mg via ORAL
  Filled 2018-04-30 (×7): qty 2

## 2018-04-30 MED ORDER — INSULIN ASPART 100 UNIT/ML ~~LOC~~ SOLN
0.0000 [IU] | Freq: Three times a day (TID) | SUBCUTANEOUS | Status: DC
  Administered 2018-05-01 (×2): 3 [IU] via SUBCUTANEOUS
  Administered 2018-05-02 (×2): 5 [IU] via SUBCUTANEOUS
  Filled 2018-04-30 (×4): qty 1

## 2018-04-30 MED ORDER — ASPIRIN EC 81 MG PO TBEC
81.0000 mg | DELAYED_RELEASE_TABLET | Freq: Every day | ORAL | Status: DC
  Administered 2018-05-01 – 2018-05-02 (×2): 81 mg via ORAL
  Filled 2018-04-30 (×3): qty 1

## 2018-04-30 MED ORDER — ISOSORBIDE MONONITRATE ER 30 MG PO TB24
120.0000 mg | ORAL_TABLET | Freq: Every day | ORAL | Status: DC
  Administered 2018-05-01 – 2018-05-02 (×2): 120 mg via ORAL
  Filled 2018-04-30 (×3): qty 4

## 2018-04-30 MED ORDER — COLCHICINE 0.6 MG PO TABS
0.6000 mg | ORAL_TABLET | Freq: Every day | ORAL | Status: DC
  Administered 2018-05-01 – 2018-05-02 (×2): 0.6 mg via ORAL
  Filled 2018-04-30 (×2): qty 1

## 2018-04-30 MED ORDER — IOPAMIDOL (ISOVUE-370) INJECTION 76%
75.0000 mL | Freq: Once | INTRAVENOUS | Status: AC | PRN
  Administered 2018-04-30: 75 mL via INTRAVENOUS

## 2018-04-30 MED ORDER — INSULIN ASPART 100 UNIT/ML ~~LOC~~ SOLN
6.0000 [IU] | Freq: Three times a day (TID) | SUBCUTANEOUS | Status: DC
  Administered 2018-05-01 – 2018-05-02 (×4): 6 [IU] via SUBCUTANEOUS
  Filled 2018-04-30 (×4): qty 1

## 2018-04-30 MED ORDER — IPRATROPIUM BROMIDE 0.06 % NA SOLN
2.0000 | Freq: Four times a day (QID) | NASAL | Status: DC | PRN
  Administered 2018-05-01: 2 via NASAL
  Filled 2018-04-30: qty 15

## 2018-04-30 NOTE — ED Notes (Signed)
Christina RN, aware of bed assigned

## 2018-04-30 NOTE — H&P (Signed)
[facility] NAME: Chase Cabrera    MR#:  [id number]  DATE OF BIRTH:  1945/12/24  DATE OF ADMISSION:  04/30/2018  PRIMARY CARE PHYSICIAN: McLean-Scocuzza, Nino Glow, MD   REQUESTING/REFERRING PHYSICIAN: Dr Conni Slipper  CHIEF COMPLAINT:   Chief Complaint  Patient presents with  . Numbness    HISTORY OF PRESENT ILLNESS:  Chase Cabrera  is a 72 y.o. male with a known history of CAD, previous atrial fibrillation status post ablation, diabetes, Crohn's disease and early dementia.  He presents with an episode of left arm numbness and left facial numbness going on for about 5 minutes.  He stated his lip was also numb at that time.  Around 2:30 PM he had another episode where he had to his left thumb that would not work very well in his left arm and left face were numb.  At that time also had some left eye visual symptoms and his left eye was drooping a little bit.  The numbness lasted about 4 minutes but the left eye drooping is still going on.  Vision is better.  PAST MEDICAL HISTORY:   Past Medical History:  Diagnosis Date  . Arthritis   . Atrial fibrillation ([place])   . CAD (coronary artery disease)    6 STENTS  . CHF (congestive heart failure) ([place])   . Chicken pox   . Colon polyp   . Corneal dystrophy    bilateral  . Crohn's disease ([place])   . Diabetes ([place]) 2002  . Dysrhythmia    A-FIB AND PAF  . GERD (gastroesophageal reflux disease)   . Gout   . History of kidney stones   . History of shingles   . Hyperlipemia   . Hypertension   . Kidney stones   . Myocardial infarction ([place])    x4, last one in 2010  . Peripheral neuropathy   . Peripheral neuropathy   . Peripheral neuropathy   . PUD (peptic ulcer disease)   . Rectus sheath hematoma    02/18/18 8.9 x 4.5 cm then 02/19/18 7.9 x 4.5 cm   . Renal artery stenosis ([place])   . Renal artery stenosis ([place])   . Salzmann's nodular dystrophy 2012  . Sleep apnea   .  Spinal stenosis     PAST SURGICAL HISTORY:   Past Surgical History:  Procedure Laterality Date  . ABLATION  2012  . APPLICATION VERTERBRAL DEFECT PROSTHETIC  05/01/2013  . ARTHRODESIS ANTERIOR LUMBAR SPINE  05/01/2013  . BACK SURGERY     lumbar fusion  . CARDIAC CATHETERIZATION    . CARDIAC ELECTROPHYSIOLOGY STUDY AND ABLATION    . CARDIAC SURGERY    . CARDIOVERSION    . CHOLECYSTECTOMY    . COLONOSCOPY WITH PROPOFOL N/A 04/09/2016   Completed for chronic diarrhea.  Few scattered diverticuli.  No mucosal lesions.  Surgeon: Lollie Sails, MD;  Location: [facility] ENDOSCOPY;  Service: Endoscopy;  Laterality: N/A;  . CORNEAL EYE SURGERY Bilateral 07/28/11    09/22/2011  . CORONARY ANGIOPLASTY    . CORONARY ANGIOPLASTY WITH STENT PLACEMENT  03/28/2015   Distal 80% to normal Stent, Dilation Balloon  . CORONARY ARTERY BYPASS GRAFT     4 VESSELS  . EYE SURGERY     bilateral cataract 2017 Dr. Megan Mans  . foot and ankle repair Right 2005  . FRACTURE SURGERY     ANKLE PLATE AND SCREWS  . GALLBLADER    .  JOINT REPLACEMENT     left total hip 11/11/15  . JOINT REPLACEMENT     right total hip 12/2017 Dr. Rudene Christians   . KNEE ARTHROSCOPY Right   . LUMBAR SPINE FUSION ONE LEVEL  05/03/2013  . RENAL ARTERY STENT Right   . ROTATOR CUFF REPAIR Right 2006  . TONSILLECTOMY    . TOTAL HIP ARTHROPLASTY Left 11/11/2015   Procedure: TOTAL HIP ARTHROPLASTY ANTERIOR APPROACH;  Surgeon: Hessie Knows, MD;  Location: [facility] ORS;  Service: Orthopedics;  Laterality: Left;  . TOTAL HIP ARTHROPLASTY Right 12/13/2017   Procedure: TOTAL HIP ARTHROPLASTY ANTERIOR APPROACH;  Surgeon: Hessie Knows, MD;  Location: [facility] ORS;  Service: Orthopedics;  Laterality: Right;  . TRANSCATH PLACEMENT INTRAVASCULAR STENT LEG  03/2015    SOCIAL HISTORY:   Social History   Tobacco Use  . Smoking status: Former Smoker    Packs/day: 1.00    Years: 3.00    Pack years: 3.00    Types: Cigarettes    Last attempt to quit: 06/30/1962     Years since quitting: 55.8  . Smokeless tobacco: Former Systems developer    Types: Chew    Quit date: 12/05/1968  Substance Use Topics  . Alcohol use: No    FAMILY HISTORY:   Family History  Problem Relation Age of Onset  . CVA Father   . Stroke Father   . Lung cancer Mother   . Arthritis Mother   . Stomach cancer Sister   . Colon cancer Sister   . Diabetes Brother     DRUG ALLERGIES:   Allergies  Allergen Reactions  . Allopurinol Diarrhea, Other (See Comments) and Nausea And Vomiting    Other Reaction: GI Upset  . Atenolol Other (See Comments)    Other Reaction: bradycardia  . Atorvastatin Other (See Comments)    Other Reaction: muscle aches  . Ramipril Rash  . Rosuvastatin Other (See Comments)    Muscle aches  . Simvastatin Other (See Comments)    Other Reaction: MUSCLE ACHES (Zocor)  . Valsartan Other (See Comments)    Other Reaction: facial swelling  . Cardizem [Diltiazem] Rash    REVIEW OF SYSTEMS:  CONSTITUTIONAL: No fever.  Positive for sweating at night.  No fatigue or weakness.  EYES: Some left eye visual symptoms during the episode. EARS, NOSE, AND THROAT: No tinnitus or ear pain. No sore throat.  Positive for postnasal drip and runny nose RESPIRATORY: Positive for cough, and shortness of breath.  No wheezing or hemoptysis.  CARDIOVASCULAR: Positive for chest pain, no orthopnea, edema.  GASTROINTESTINAL: No nausea, vomiting.  Positive for diarrhea and abdominal pain. No blood in bowel movements GENITOURINARY: No dysuria, hematuria.  ENDOCRINE: No polyuria, nocturia,  HEMATOLOGY: No anemia, easy bruising or bleeding SKIN: No rash or lesion. MUSCULOSKELETAL: Positive for joint pains all over NEUROLOGIC: Positive for left-sided body numbness.  Also after his right hip surgery he did have numbness on the right side. PSYCHIATRY: No anxiety or depression.   MEDICATIONS AT HOME:   Prior to Admission medications   Medication Sig Start Date End Date Taking? Authorizing  Provider  amLODipine (NORVASC) 5 MG tablet Take 1 tablet (5 mg total) by mouth 2 (two) times daily. If blood pressure >140/>90 make take another 1 pill of 5 mg Patient taking differently: Take 5 mg by mouth 2 (two) times daily.  04/09/18  Yes McLean-Scocuzza, Nino Glow, MD  carvedilol (COREG) 25 MG tablet Take 1 tablet (25 mg total) by mouth 2 (two) times daily  with a meal. 04/05/18  Yes McLean-Scocuzza, Nino Glow, MD  Cholecalciferol 50000 units TABS Take 1 tablet by mouth once a week. Patient taking differently: Take 50,000 Units by mouth every Saturday.  10/07/17  Yes McLean-Scocuzza, Nino Glow, MD  cholestyramine light (PREVALITE) 4 g packet Take 4 g by mouth daily with lunch.  01/31/18  Yes [provider]  clopidogrel (PLAVIX) 75 MG tablet Take 75 mg by mouth daily.   Yes [provider]  colchicine 0.6 MG tablet TAKE 1 TABLET BY MOUTH ONCE DAILY 07/12/17  Yes Leone Haven, MD  diclofenac sodium (VOLTAREN) 1 % GEL Apply 2 g topically 4 (four) times daily as needed (for pain).    Yes [provider]  dicyclomine (BENTYL) 10 MG capsule Take 10 mg by mouth 3 (three) times daily before meals.    Yes [provider]  gabapentin (NEURONTIN) 300 MG capsule take 2 capsules (6108m) by mouth three times a day 01/16/18  Yes McLean-Scocuzza, TNino Glow MD  hydrALAZINE (APRESOLINE) 100 MG tablet TAKE 100 MG BY MOUTH THREE TIMES A DAY 12/29/17  Yes McLean-Scocuzza, TNino Glow MD  HYDROcodone-acetaminophen (NORCO) 5-325 MG tablet Take 1 tablet by mouth every 4 (four) hours as needed for severe pain (try not to take more than 2 a day.). 02/20/18  Yes MNena Polio MD  insulin lispro (HUMALOG) 100 UNIT/ML KiwkPen Inject 14-32 Units into the skin 3 (three) times daily. (according to sliding scale - max 96u over 24 hours)   Yes [provider]  ipratropium (ATROVENT) 0.06 % nasal spray Place 2 sprays into both nostrils 4 (four) times daily. 02/09/18  Yes McLean-Scocuzza, TNino Glow  MD  isosorbide mononitrate (IMDUR) 120 MG 24 hr tablet TAKE 1 TABLET BY MOUTH EVERY DAY 10/07/17  Yes McLean-Scocuzza, TNino Glow MD  levocetirizine (XYZAL) 5 MG tablet Take 1 tablet (5 mg total) by mouth every evening. 02/09/18  Yes McLean-Scocuzza, TNino Glow MD  metFORMIN (GLUCOPHAGE) 1000 MG tablet Take 1,000 mg by mouth 2 (two) times daily with a meal.   Yes [provider]  montelukast (SINGULAIR) 10 MG tablet Take 1 tablet (10 mg total) by mouth at bedtime. 02/09/18  Yes McLean-Scocuzza, TNino Glow MD  Multiple Vitamin (MULTI-VITAMINS) TABS Take 1 tablet by mouth daily. 04/15/09  Yes [provider]  nitroGLYCERIN (NITROSTAT) 0.4 MG SL tablet Place 1 tablet (0.4 mg total) under the tongue every 5 (five) minutes x 3 doses as needed. For chest pain.  Call 911 if no relief after 3 tablets 10/03/17 11/02/21 Yes McLean-Scocuzza, TNino Glow MD  omeprazole (PRILOSEC) 40 MG capsule Take 40 mg by mouth 2 (two) times daily.    Yes [provider]  pravastatin (PRAVACHOL) 40 MG tablet Take 1 tablet (40 mg total) by mouth daily. 03/15/18  Yes McLean-Scocuzza, TNino Glow MD  probenecid (BENEMID) 500 MG tablet Take 500 mg by mouth 2 (two) times daily.  01/09/15  Yes [provider]  TRESIBA FLEXTOUCH 200 UNIT/ML SOPN Inject 90 Units into the skin at bedtime. 04/25/18  Yes [provider]  vitamin C (ASCORBIC ACID) 500 MG tablet Take 500 mg by mouth daily.    Yes [provider]  Vitamin E 400 units TABS Take 400 Units by mouth 2 (two) times daily.   Yes [provider]  LEVEMIR FLEXTOUCH 100 UNIT/ML Pen Inject 80 Units into the skin daily at 10 pm. Patient not taking: Reported on 04/30/2018 01/19/18   McLean-Scocuzza, TNino Glow MD  VITAL SIGNS:  Blood pressure (!) 167/63, pulse 60, temperature 98.2 F (36.8 C), resp. rate 18, height 5' 5"  (1.651 m), weight 84.8 kg, SpO2 95 %.  PHYSICAL EXAMINATION:  GENERAL:  72 y.o.-year-old patient lying in the bed with no  acute distress.  EYES: Pupils equal, round, reactive to light and accommodation. No scleral icterus. Extraocular muscles intact.  HEENT: Head atraumatic, normocephalic. Oropharynx and nasopharynx clear.  NECK:  Supple, no jugular venous distention. No thyroid enlargement, no tenderness.  LUNGS: Normal breath sounds bilaterally, no wheezing, rales,rhonchi or crepitation. No use of accessory muscles of respiration.  CARDIOVASCULAR: S1, S2 normal. No murmurs, rubs, or gallops.  ABDOMEN: Soft, nontender, nondistended. Bowel sounds present. No organomegaly or mass.  EXTREMITIES: No pedal edema, cyanosis, or clubbing.  NEUROLOGIC: Cranial nerves II through XII are intact. Muscle strength 5/5 in all extremities. Sensation intact. Gait not checked.  PSYCHIATRIC: The patient is alert and oriented x 3.  SKIN: No rash, lesion, or ulcer.   LABORATORY PANEL:   CBC Recent Labs  Lab 04/30/18 1618  WBC 8.1  HGB 13.1  HCT 37.7*  PLT 249   ------------------------------------------------------------------------------------------------------------------  Chemistries  Recent Labs  Lab 04/30/18 1618  NA 137  K 4.0  CL 103  CO2 22  GLUCOSE 192*  BUN 23  CREATININE 0.96  CALCIUM 9.4  AST 41  ALT 32  ALKPHOS 92  BILITOT 0.7   ------------------------------------------------------------------------------------------------------------------  Cardiac Enzymes Recent Labs  Lab 04/30/18 1618  TROPONINI <0.03   ------------------------------------------------------------------------------------------------------------------  RADIOLOGY:  Ct Head Wo Contrast  Result Date: 04/30/2018 CLINICAL DATA:  Left facial and upper extremity numbness EXAM: CT HEAD WITHOUT CONTRAST TECHNIQUE: Contiguous axial images were obtained from the base of the skull through the vertex without intravenous contrast. COMPARISON:  Feb 09, 2018 FINDINGS: Brain: Mild diffuse atrophy is stable. There is no intracranial  mass, hemorrhage, extra-axial fluid collection, or midline shift. There is patchy small vessel disease in the centra semiovale bilaterally, stable. There is no new gray-white compartment lesion. No acute infarct is appreciable. Vascular: No hyperdense vessel. There is calcification in each carotid siphon region. Skull: The bony calvarium appears intact. Sinuses/Orbits: There is mucosal thickening in several ethmoid air cells. Other visualized paranasal sinuses are clear. Orbits appear symmetric bilaterally. There has been bilateral cataract removal. Other: Mastoid air cells are clear. IMPRESSION: Atrophy with supratentorial small vessel disease, stable. No acute infarct. No mass or hemorrhage. There are foci of arterial vascular calcification. There is mucosal thickening in several ethmoid air cells. Electronically Signed   By: Lowella Grip III M.D.   On: 04/30/2018 17:07    EKG:   Normal sinus rhythm 64 bpm, left axis deviation  IMPRESSION AND PLAN:   1.  Left facial numbness, left arm numbness and difficulty moving his left thumb, left eye visual disturbance and left eye lid droop.  Suspected TIA.  Rule out stroke.  MRI of the brain, CT Angio of the carotids and echocardiogram ordered.  With the patient's history of atrial fibrillation in the past can consider a Linq recorder as outpatient.  Patient already takes Plavix.  Add aspirin.  Patient has allergy to numerous statins.  Continue pravastatin and check a lipid profile in the a.m.  Neurology consultation, PT and OT consultations. 2.  Hypertension.  Allow permissive hypertension.  Hold hydralazine.  Continue Coreg and Norvasc. 3.  Type 2 diabetes mellitus.  Switch Tresiba insulin over to Lantus for hospital.  I will decrease the dose down to  70 units.  Sliding scale insulin with short acting insulin prior to meals. 4.  History of CAD on aspirin and Plavix and Coreg.  Also on statin. 5.  Hyperlipidemia unspecified with numerous allergies to  statins.  Check lipid profile and continue pravastatin. 6.  Mild dementia 7.  Osteoarthritis 8.  Crohn's disease  All the records are reviewed and case discussed with ED provider. Management plans discussed with the patient, family and they are in agreement.  CODE STATUS: Full code  TOTAL TIME TAKING CARE OF THIS PATIENT: 55 minutes, including ACP time   Loletha Grayer M.D on 04/30/2018 at 6:59 PM  Between 7am to 6pm - Pager - [phone]  After 6pm call admission pager [phone]  [facility] [facility]  [phone]  CC: Primary care physician; McLean-Scocuzza, Nino Glow, MD

## 2018-04-30 NOTE — Progress Notes (Signed)
Patient ID: Chase Cabrera, male   DOB: 01-02-1946, 72 y.o.   MRN: [id number]  ACP note  Patient and wife at the bedside  Diagnosis: Left left arm numbness and left facial numbness left eye visual changes and left eyelid droop and difficulty moving left thumb.  Rule out stroke.  Suspected TIA.  History of hypertension, CAD, dementia, Crohn's disease and diabetes.  CODE STATUS discussed.  Patient wishes to be a DO NOT RESUSCITATE.  Plan.  MRI of the brain, CT Angio of the carotids, echocardiogram.  Add aspirin to Plavix.  Neurology, PT and OT consultations.  Time spent on ACP discussion 20 minutes Dr. Loletha Grayer

## 2018-04-30 NOTE — ED Triage Notes (Addendum)
Symptoms began around 9:30am today. States L sided facial numbness and L arm numbness that is intermittent. States 2 episodes of it today. Speech clear. Face symmetrical. No droop noted. Grips equal. Moving all extremities on own. Pt is HOH. Wife keeps answering for pt. Was taken off warfarin a few months ago d/t hematoma on hip. Wife states that he was on warfarin to prevent strokes.

## 2018-05-01 ENCOUNTER — Observation Stay
Admit: 2018-05-01 | Discharge: 2018-05-01 | Disposition: A | Discharge status: Home / Self Care | Payer: Medicare Other | Attending: Internal Medicine | Admitting: Internal Medicine

## 2018-05-01 DIAGNOSIS — R200 Anesthesia of skin: Secondary | ICD-10-CM | POA: Diagnosis not present

## 2018-05-01 DIAGNOSIS — R29810 Facial weakness: Secondary | ICD-10-CM

## 2018-05-01 DIAGNOSIS — E119 Type 2 diabetes mellitus without complications: Secondary | ICD-10-CM | POA: Diagnosis not present

## 2018-05-01 DIAGNOSIS — I10 Essential (primary) hypertension: Secondary | ICD-10-CM | POA: Diagnosis not present

## 2018-05-01 DIAGNOSIS — Z8679 Personal history of other diseases of the circulatory system: Secondary | ICD-10-CM | POA: Diagnosis not present

## 2018-05-01 DIAGNOSIS — G459 Transient cerebral ischemic attack, unspecified: Secondary | ICD-10-CM | POA: Diagnosis not present

## 2018-05-01 LAB — BASIC METABOLIC PANEL
ANION GAP: 13 (ref 5–15)
BUN: 18 mg/dL (ref 8–23)
CHLORIDE: 101 mmol/L (ref 98–111)
CO2: 23 mmol/L (ref 22–32)
Calcium: 8.9 mg/dL (ref 8.9–10.3)
Creatinine, Ser: 0.82 mg/dL (ref 0.61–1.24)
GFR calc Af Amer: >60 mL/min (ref >60)
GLUCOSE: 182 mg/dL — AB (ref 70–99)
POTASSIUM: 3.9 mmol/L (ref 3.5–5.1)
Sodium: 137 mmol/L (ref 135–145)

## 2018-05-01 LAB — CBC
HCT: 36.9 % — ABNORMAL LOW (ref 40.0–52.0)
HEMOGLOBIN: 13 g/dL (ref 13.0–18.0)
MCH: 33.3 pg (ref 26.0–34.0)
MCHC: 35.2 g/dL (ref 32.0–36.0)
MCV: 94.7 fL (ref 80.0–100.0)
PLATELETS: 217 10*3/uL (ref 150–440)
RBC: 3.9 MIL/uL — AB (ref 4.40–5.90)
RDW: 16.1 % — ABNORMAL HIGH (ref 11.5–14.5)
WBC: 6.7 10*3/uL (ref 3.8–10.6)

## 2018-05-01 LAB — GLUCOSE, CAPILLARY
GLUCOSE-CAPILLARY: 181 mg/dL — AB (ref 70–99)
Glucose-Capillary: 216 mg/dL — ABNORMAL HIGH (ref 70–99)
Glucose-Capillary: 165 mg/dL — ABNORMAL HIGH (ref 70–99)
Glucose-Capillary: 167 mg/dL — ABNORMAL HIGH (ref 70–99)

## 2018-05-01 LAB — LIPID PANEL
CHOL/HDL RATIO: 5.5 ratio
Cholesterol: 144 mg/dL (ref 0–200)
HDL: 26 mg/dL — AB (ref >40)
LDL Cholesterol: UNDETERMINED mg/dL (ref 0–99)
Triglycerides: 609 mg/dL — ABNORMAL HIGH (ref <150)
VLDL: UNDETERMINED mg/dL (ref 0–40)

## 2018-05-01 MED ORDER — HYDRALAZINE HCL 50 MG PO TABS
100.0000 mg | ORAL_TABLET | Freq: Three times a day (TID) | ORAL | Status: DC
  Administered 2018-05-01 – 2018-05-02 (×6): 100 mg via ORAL
  Filled 2018-05-01 (×6): qty 2

## 2018-05-01 MED ORDER — ASPIRIN 81 MG PO TBEC
81.0000 mg | DELAYED_RELEASE_TABLET | Freq: Every day | ORAL | 0 refills | Status: AC
Start: 2018-05-02 — End: ?

## 2018-05-01 MED ORDER — OMEGA-3-ACID ETHYL ESTERS 1 G PO CAPS
2.0000 g | ORAL_CAPSULE | Freq: Two times a day (BID) | ORAL | Status: DC
  Administered 2018-05-01 – 2018-05-02 (×2): 2 g via ORAL
  Filled 2018-05-01 (×2): qty 2

## 2018-05-01 MED ORDER — OMEGA-3-ACID ETHYL ESTERS 1 G PO CAPS: 2.0000 g | ORAL_CAPSULE | Freq: Two times a day (BID) | ORAL | 0 refills | Status: DC

## 2018-05-01 MED ORDER — AMLODIPINE BESYLATE 5 MG PO TABS: 5.0000 mg | ORAL_TABLET | Freq: Two times a day (BID) | ORAL | 3 refills | Status: AC

## 2018-05-01 NOTE — Evaluation (Signed)
Occupational Therapy Evaluation Patient Details Name: Chase Cabrera MRN: [id number] DOB: November 20, 1945 Today's Date: 05/01/2018    History of Present Illness 72yo male presented to ER after two transient episodes of L UE and facial numbness/tingling, mild facial drooping; admitted under observation for TIA/CVA work-up.  Imaging negative for acute infarct; patient reports symptoms fully resolved at this time.   Clinical Impression   Pt seen for OT evaluation this date. Prior to hospital admission, pt was independent with ADL, able to walk short distances by himself, otherwise using a scooter for mobility. Pt lives with his spouse.  Currently pt demonstrates impairments in safety awareness and balance requiring CGA assist for LB ADL and functional mobility. Pt instructed in RW use including hand/foot placement to maximize safe use of RW. Pt would benefit from skilled OT to address noted impairments and functional limitations (see below for any additional details) in order to maximize safety and independence while minimizing falls risk and caregiver burden.  Upon hospital discharge, recommend pt discharge to home with [facility] services.     Follow Up Recommendations  Home health OT    Equipment Recommendations  Other (comment)(TBD)    Recommendations for Other Services       Precautions / Restrictions Precautions Precautions: Fall Restrictions Weight Bearing Restrictions: No      Mobility Bed Mobility Overal bed mobility: Modified Independent             General bed mobility comments: sit>sup  Transfers Overall transfer level: Needs assistance Equipment used: Rolling walker (2 wheeled) Transfers: Sit to/from Stand Sit to Stand: Min guard              Balance Overall balance assessment: Needs assistance Sitting-balance support: No upper extremity supported;Feet supported Sitting balance-Leahy Scale: Good     Standing balance support: Bilateral upper extremity  supported Standing balance-Leahy Scale: Fair                             ADL either performed or assessed with clinical judgement   ADL Overall ADL's : Needs assistance/impaired     Grooming: Standing;Wash/dry hands;Min guard Grooming Details (indicate cue type and reason): stood at sink within RW to wash his hands with no overt LOB Upper Body Bathing: Sitting;Supervision/ safety   Lower Body Bathing: Sit to/from stand;Min guard;Set up   Upper Body Dressing : Sitting;Supervision/safety   Lower Body Dressing: Sit to/from stand;Set up;Min guard   Toilet Transfer: Min guard;RW;Grab bars Armed forces technical officer Details (indicate cue type and reason): cues for hand placement/RW use Toileting- Clothing Manipulation and Hygiene: Supervision/safety;Sit to/from stand Toileting - Clothing Manipulation Details (indicate cue type and reason): supervision to thread disposable undergarments over feet while in sitting and crossing LE over the other knee     Functional mobility during ADLs: Min guard;Rolling walker;Cueing for sequencing       Vision Baseline Vision/History: Wears glasses Wears Glasses: At all times Patient Visual Report: No change from baseline Vision Assessment?: No apparent visual deficits     Perception     Praxis      Pertinent Vitals/Pain Pain Assessment: No/denies pain     Hand Dominance Right(spouse states he can use both)   Extremity/Trunk Assessment Upper Extremity Assessment Upper Extremity Assessment: Overall WFL for tasks assessed(intact strength, coordination, and sensation)   Lower Extremity Assessment Lower Extremity Assessment: Overall WFL for tasks assessed       Communication Communication Communication: No difficulties   Cognition  Arousal/Alertness: Awake/alert Behavior During Therapy: WFL for tasks assessed/performed Overall Cognitive Status: Within Functional Limits for tasks assessed                                      General Comments       Exercises Other Exercises Other Exercises: pt instructed in safety and falls prevention strategies with RW during functional mobility/ADL tasks   Shoulder Instructions      Home Living Family/patient expects to be discharged to:: Private residence Living Arrangements: Spouse/significant other Available Help at Discharge: Family;Available 24 hours/day Type of Home: House Home Access: Stairs to enter;Ramped entrance     Home Layout: One level     Bathroom Shower/Tub: Teacher, early years/pre: Standard     Home Equipment: Environmental consultant - 2 wheels;Electric scooter;Shower seat;Bedside commode;Grab bars - tub/shower          Prior Functioning/Environment Level of Independence: Independent with assistive device(s)        Comments: Mod indep with RW for very limited household distances; scooter as primary mobility.  Does endorse 1-2 falls within previous six months.  Recently completed course of HHPT after recent R THR.        OT Problem List: Decreased strength;Decreased knowledge of use of DME or AE;Decreased cognition;Decreased activity tolerance;Decreased safety awareness;Impaired balance (sitting and/or standing)      OT Treatment/Interventions: Self-care/ADL training;Balance training;Therapeutic exercise;Therapeutic activities;Neuromuscular education;DME and/or AE instruction;Energy conservation;Patient/family education    OT Goals(Current goals can be found in the care plan section) Acute Rehab OT Goals Patient Stated Goal: go home OT Goal Formulation: With patient/family Time For Goal Achievement: 05/15/18 Potential to Achieve Goals: Good ADL Goals Pt Will Perform Lower Body Dressing: with supervision;sit to/from stand(AE/DME as needed, PRN verbal cues) Pt Will Transfer to Toilet: ambulating;with supervision;regular height toilet  OT Frequency: Min 1X/week   Barriers to D/C:            Co-evaluation               AM-PAC PT "6 Clicks" Daily Activity     Outcome Measure Help from another person eating meals?: None Help from another person taking care of personal grooming?: A Little Help from another person toileting, which includes using toliet, bedpan, or urinal?: A Little Help from another person bathing (including washing, rinsing, drying)?: A Little Help from another person to put on and taking off regular upper body clothing?: None Help from another person to put on and taking off regular lower body clothing?: A Little 6 Click Score: 20   End of Session Equipment Utilized During Treatment: Gait belt;Rolling walker  Activity Tolerance: Patient tolerated treatment well Patient left: in bed;with call bell/phone within reach;with bed alarm set;with nursing/sitter in room;with family/visitor present(neuro NP in room to assess)  OT Visit Diagnosis: Other abnormalities of gait and mobility (R26.89);Muscle weakness (generalized) (M62.81);Other symptoms and signs involving cognitive function                Time: 8295-6213 OT Time Calculation (min): 18 min Charges:  OT General Charges $OT Visit: 1 Visit OT Evaluation $OT Eval Low Complexity: 1 Low OT Treatments $Self Care/Home Management : 8-22 mins  Jeni Salles, MPH, MS, OTR/L ascom [phone] 05/01/18, 11:17 AM

## 2018-05-01 NOTE — Clinical Social Work Note (Signed)
CSW consulted for "trouble affording insulin". CSW signed off since this is not an appropriate CSW consult. RN CM needs to be consulted for medication related concerns. Please re consult if CSW needs arise.   [place], [place]

## 2018-05-01 NOTE — Evaluation (Signed)
Physical Therapy Evaluation Patient Details Name: Chase Cabrera MRN: [id number] DOB: August 15, 1946 Today's Date: 05/01/2018   History of Present Illness  presented to ER after two transient episodes of L UE and facial numbness/tingling, mild facial drooping; admitted under observation for TIA/CVA work-up.  Imaging negative for acute infarct; patient reports symptoms fully resolved at this time.  Clinical Impression  Upon evaluation, patient alert and oriented; follows commands and demonstrates effort with all mobility tasks.  Bilat UE/LE grossly symmetrical and WFL for basic transfers and gait; no focal weakness, sensory or coordination deficit noted.  Demonstrates ability to complete bed mobility with mod indep; sit/stand, basic transfers and gait (15') with RW.  Decreased balance reactions/abilities noted (requires UE support at all times).  Min cuing for walker position and safety, but no overt buckling or LOB.  Wife present throughout session; reports mobility is near baseline for patient. Would benefit from skilled PT to address above deficits and promote optimal return to PLOF; Recommend transition to [place] upon discharge from acute hospitalization to address higher-level balance and mobility deficits.     Follow Up Recommendations Home health PT    Equipment Recommendations  (has necessary equipment)    Recommendations for Other Services       Precautions / Restrictions Precautions Precautions: Fall Restrictions Weight Bearing Restrictions: No      Mobility  Bed Mobility Overal bed mobility: Modified Independent                Transfers Overall transfer level: Needs assistance Equipment used: Rolling walker (2 wheeled) Transfers: Sit to/from Stand Sit to Stand: Min guard            Ambulation/Gait Ambulation/Gait assistance: Min guard Gait Distance (Feet): 15 Feet Assistive device: Rolling walker (2 wheeled)       General Gait Details: broad BOS with RW  anterior to BOS (limited receptiveness to cuing for correction); decreased dynamic balance reactions, but no overt buckling or LOB during gait trial  Stairs            Wheelchair Mobility    Modified Rankin (Stroke Patients Only)       Balance Overall balance assessment: Needs assistance Sitting-balance support: No upper extremity supported;Feet supported Sitting balance-Leahy Scale: Good     Standing balance support: Bilateral upper extremity supported Standing balance-Leahy Scale: Fair                               Pertinent Vitals/Pain Pain Assessment: No/denies pain    Home Living Family/patient expects to be discharged to:: Private residence Living Arrangements: Spouse/significant other Available Help at Discharge: Family;Available 24 hours/day Type of Home: House Home Access: Stairs to enter;Ramped entrance     Home Layout: One level Home Equipment: Walker - 2 wheels;Electric scooter;Shower seat;Bedside commode;Grab bars - tub/shower      Prior Function Level of Independence: Independent with assistive device(s)         Comments: Mod indep with RW for very limited household distances; scooter as primary mobility.  Does endorse 1-2 falls within previous six months.  Recently completed course of HHPT after recent R THR.     Hand Dominance        Extremity/Trunk Assessment   Upper Extremity Assessment Upper Extremity Assessment: Overall WFL for tasks assessed    Lower Extremity Assessment Lower Extremity Assessment: Overall WFL for tasks assessed(grossly 4-/5 throughout bilat LEs, denies numbness/tingling; no coordination deficit noted)  Communication   Communication: No difficulties  Cognition Arousal/Alertness: Awake/alert Behavior During Therapy: WFL for tasks assessed/performed Overall Cognitive Status: Within Functional Limits for tasks assessed                                        General Comments       Exercises  Toilet transfer, ambulatory with RW, cga; cga/min assist for walker position/management.     Assessment/Plan    PT Assessment Patient needs continued PT services  PT Problem List Decreased strength;Decreased activity tolerance;Decreased balance;Decreased mobility;Decreased coordination;Decreased knowledge of use of DME;Decreased safety awareness;Decreased knowledge of precautions       PT Treatment Interventions DME instruction;Gait training;Functional mobility training;Therapeutic activities;Therapeutic exercise;Balance training;Cognitive remediation    PT Goals (Current goals can be found in the Care Plan section)  Acute Rehab PT Goals Patient Stated Goal: to go to the bathroom PT Goal Formulation: With patient Time For Goal Achievement: 05/15/18 Potential to Achieve Goals: Good    Frequency Min 2X/week   Barriers to discharge        Co-evaluation               AM-PAC PT "6 Clicks" Daily Activity  Outcome Measure Difficulty turning over in bed (including adjusting bedclothes, sheets and blankets)?: None Difficulty moving from lying on back to sitting on the side of the bed? : None Difficulty sitting down on and standing up from a chair with arms (e.g., wheelchair, bedside commode, etc,.)?: Unable Help needed moving to and from a bed to chair (including a wheelchair)?: A Little Help needed walking in hospital room?: A Little Help needed climbing 3-5 steps with a railing? : A Little 6 Click Score: 18    End of Session Equipment Utilized During Treatment: Gait belt Activity Tolerance: Patient tolerated treatment well Patient left: (left on BSC with OT at bedside to assist at completino) Nurse Communication: Mobility status PT Visit Diagnosis: Difficulty in walking, not elsewhere classified (R26.2)    Time: 0936-1000 PT Time Calculation (min) (ACUTE ONLY): 24 min   Charges:   PT Evaluation $PT Eval Low Complexity: 1 Low PT  Treatments $Therapeutic Activity: 8-22 mins        Mairead Schwarzkopf H. Owens Shark, PT, DPT, NCS 05/01/18, 10:30 AM [phone]

## 2018-05-01 NOTE — Discharge Instructions (Signed)
It was a pleasure taking care of you during your hospitalization!  You came into the hospital because you were having numbness of your face and left arm. We think you may have had a transient ischemic attack (TIA), which is when there is an area of your brain that is not getting good blood flow. Your MRI showed that you did not have a stroke.  We have started you on aspirin- please take 1 tablet daily. You should continue taking Plavix daily.  Your cholesterol was very high, which can put you at risk of a heart attack or stroke. Please keep taking your Pravastatin every day. We have also added another medication called Lovaza to help with your cholesterol. Please take 1 tablet twice a day.  Please make sure you schedule follow-up appointments with your primary care doctor and your neurologist in the next 1-2 weeks.  -Dr. Brett Albino

## 2018-05-01 NOTE — Discharge Summary (Signed)
[facility] [place] NAME: Chase Cabrera    MR#:  [id number]  DATE OF BIRTH:  09-Dec-1945  DATE OF ADMISSION:  04/30/2018   ADMITTING PHYSICIAN: Chase Grayer, MD  DATE OF DISCHARGE: 05/01/18  PRIMARY CARE PHYSICIAN: McLean-Scocuzza, Nino Glow, MD   ADMISSION DIAGNOSIS:  TIA symptoms DISCHARGE DIAGNOSIS:  Active Problems:   TIA (transient ischemic attack)  SECONDARY DIAGNOSIS:   Past Medical History:  Diagnosis Date  . Arthritis   . Atrial fibrillation ([place])   . CAD (coronary artery disease)    6 STENTS  . CHF (congestive heart failure) ([place])   . Chicken pox   . Colon polyp   . Corneal dystrophy    bilateral  . Crohn's disease ([place])   . Diabetes ([place]) 2002  . Dysrhythmia    A-FIB AND PAF  . GERD (gastroesophageal reflux disease)   . Gout   . History of kidney stones   . History of shingles   . Hyperlipemia   . Hypertension   . Kidney stones   . Myocardial infarction ([place])    x4, last one in 2010  . Peripheral neuropathy   . Peripheral neuropathy   . Peripheral neuropathy   . PUD (peptic ulcer disease)   . Rectus sheath hematoma    02/18/18 8.9 x 4.5 cm then 02/19/18 7.9 x 4.5 cm   . Renal artery stenosis ([place])   . Renal artery stenosis ([place])   . Salzmann's nodular dystrophy 2012  . Sleep apnea   . Spinal stenosis    HOSPITAL COURSE:   Chase Cabrera is a 72 year old male with a PMH of CAD with hx of MI x4, hx a-fib s/p ablation, crohn's disease, type 2 diabetes, HTN who presented to the ED with left arm and left facial numbness. He was admitted for further work-up.  1.  TIA with left arm numbness and left facial numbness- symptoms resolved.  - CT head negative for acute infarct - CTA neck without significant stenosis - MRI brain without acute infarct - On Plavix at home, aspirin 70m added this admission - Lipid panel with TG 609, unable to calculate LDL- continued Pravastatin and added Lovaza bid - ECHO pending on  discharge - Neurology consulted- recommended possible EMG/NCS as outpatient - PT/OT consulted- recommended home health PT/OT  2.  Hypertension - home coreg, norvasc, and hydralazine were continued    3.  Type 2 diabetes mellitus - on lantus and sliding scale insulin during admission - no changes made to home meds on discharge.  4.  History of CAD - home plavix, coreg, and statin continued - aspirin 879madded  5.  Hyperlipidemia - home pravastatin continued - lovaza added for TG 691  DISCHARGE CONDITIONS:  TIA- resolved HTN T2DM CAD HLD Crohn's disease CONSULTS OBTAINED:  Treatment Team:  ReAlexis GoodellMD DRUG ALLERGIES:   Allergies  Allergen Reactions  . Allopurinol Diarrhea, Other (See Comments) and Nausea And Vomiting    Other Reaction: GI Upset  . Atenolol Other (See Comments)    Other Reaction: bradycardia  . Atorvastatin Other (See Comments)    Other Reaction: muscle aches  . Ramipril Rash  . Rosuvastatin Other (See Comments)    Muscle aches  . Simvastatin Other (See Comments)    Other Reaction: MUSCLE ACHES (Zocor)  . Valsartan Other (See Comments)    Other Reaction: facial swelling  . Cardizem [Diltiazem] Rash   DISCHARGE MEDICATIONS:   Allergies as  of 05/01/2018      Reactions   Allopurinol Diarrhea, Other (See Comments), Nausea And Vomiting   Other Reaction: GI Upset   Atenolol Other (See Comments)   Other Reaction: bradycardia   Atorvastatin Other (See Comments)   Other Reaction: muscle aches   Ramipril Rash   Rosuvastatin Other (See Comments)   Muscle aches   Simvastatin Other (See Comments)   Other Reaction: MUSCLE ACHES (Zocor)   Valsartan Other (See Comments)   Other Reaction: facial swelling   Cardizem [diltiazem] Rash      Medication List    STOP taking these medications   LEVEMIR FLEXTOUCH 100 UNIT/ML Pen Generic drug:  Insulin Detemir     TAKE these medications   amLODipine 5 MG tablet Commonly known as:  NORVASC Take  1 tablet (5 mg total) by mouth 2 (two) times daily. If blood pressure >140/>90 make take another 1 pill of 5 mg What changed:  additional instructions   aspirin 81 MG EC tablet Take 1 tablet (81 mg total) by mouth daily. Start taking on:  05/02/2018   BENTYL 10 MG capsule Generic drug:  dicyclomine Take 10 mg by mouth 3 (three) times daily before meals.   carvedilol 25 MG tablet Commonly known as:  COREG Take 1 tablet (25 mg total) by mouth 2 (two) times daily with a meal.   Cholecalciferol 50000 units Tabs Take 1 tablet by mouth once a week. What changed:    how much to take  when to take this   cholestyramine light 4 g packet Commonly known as:  PREVALITE Take 4 g by mouth daily with lunch.   clopidogrel 75 MG tablet Commonly known as:  PLAVIX Take 75 mg by mouth daily.   colchicine 0.6 MG tablet TAKE 1 TABLET BY MOUTH ONCE DAILY   diclofenac sodium 1 % Gel Commonly known as:  VOLTAREN Apply 2 g topically 4 (four) times daily as needed (for pain).   gabapentin 300 MG capsule Commonly known as:  NEURONTIN take 2 capsules (663m) by mouth three times a day   hydrALAZINE 100 MG tablet Commonly known as:  APRESOLINE TAKE 100 MG BY MOUTH THREE TIMES A DAY   HYDROcodone-acetaminophen 5-325 MG tablet Commonly known as:  NORCO/VICODIN Take 1 tablet by mouth every 4 (four) hours as needed for severe pain (try not to take more than 2 a day.).   insulin lispro 100 UNIT/ML KiwkPen Commonly known as:  HUMALOG Inject 14-32 Units into the skin 3 (three) times daily. (according to sliding scale - max 96u over 24 hours)   ipratropium 0.06 % nasal spray Commonly known as:  ATROVENT Place 2 sprays into both nostrils 4 (four) times daily.   isosorbide mononitrate 120 MG 24 hr tablet Commonly known as:  IMDUR TAKE 1 TABLET BY MOUTH EVERY DAY   levocetirizine 5 MG tablet Commonly known as:  XYZAL Take 1 tablet (5 mg total) by mouth every evening.   metFORMIN 1000 MG  tablet Commonly known as:  GLUCOPHAGE Take 1,000 mg by mouth 2 (two) times daily with a meal.   montelukast 10 MG tablet Commonly known as:  SINGULAIR Take 1 tablet (10 mg total) by mouth at bedtime.   MULTI-VITAMINS Tabs Take 1 tablet by mouth daily.   nitroGLYCERIN 0.4 MG SL tablet Commonly known as:  NITROSTAT Place 1 tablet (0.4 mg total) under the tongue every 5 (five) minutes x 3 doses as needed. For chest pain.  Call 911 if no relief  after 3 tablets   omega-3 acid ethyl esters 1 g capsule Commonly known as:  LOVAZA Take 2 capsules (2 g total) by mouth 2 (two) times daily.   omeprazole 40 MG capsule Commonly known as:  PRILOSEC Take 40 mg by mouth 2 (two) times daily.   pravastatin 40 MG tablet Commonly known as:  PRAVACHOL Take 1 tablet (40 mg total) by mouth daily.   probenecid 500 MG tablet Commonly known as:  BENEMID Take 500 mg by mouth 2 (two) times daily.   TRESIBA FLEXTOUCH 200 UNIT/ML Sopn Generic drug:  Insulin Degludec Inject 90 Units into the skin at bedtime.   vitamin C 500 MG tablet Commonly known as:  ASCORBIC ACID Take 500 mg by mouth daily.   Vitamin E 400 units Tabs Take 400 Units by mouth 2 (two) times daily.        DISCHARGE INSTRUCTIONS:  1. F/u with PCP in 1-2 weeks 2. F/u with neurology in 1-2 weeks 3. Already on plavix- aspirin added this admission 4. TG 691- added lovaza 5. Neuro consult recommended considering possible EMG/NCS as an outpatient 6. F/u ECHO DIET:  Heart healthy, diabetic diet DISCHARGE CONDITION:  Stable ACTIVITY:  Activity as tolerated OXYGEN:  Home Oxygen: No.  Oxygen Delivery: room air DISCHARGE LOCATION:  home   If you experience worsening of your admission symptoms, develop shortness of breath, life threatening emergency, suicidal or homicidal thoughts you must seek medical attention immediately by calling 911 or calling your MD immediately  if symptoms less severe.  You Must read complete  instructions/literature along with all the possible adverse reactions/side effects for all the Medicines you take and that have been prescribed to you. Take any new Medicines after you have completely understood and accpet all the possible adverse reactions/side effects.   Please note  You were cared for by a hospitalist during your hospital stay. If you have any questions about your discharge medications or the care you received while you were in the hospital after you are discharged, you can call the unit and asked to speak with the hospitalist on call if the hospitalist that took care of you is not available. Once you are discharged, your primary care physician will handle any further medical issues. Please note that NO REFILLS for any discharge medications will be authorized once you are discharged, as it is imperative that you return to your primary care physician (or establish a relationship with a primary care physician if you do not have one) for your aftercare needs so that they can reassess your need for medications and monitor your lab values.    On the day of Discharge:  VITAL SIGNS:  Blood pressure (!) 151/54, pulse (!) 57, temperature 97.8 F (36.6 C), temperature source Oral, resp. rate 16, height 5' 5"  (1.651 m), weight 87.7 kg, SpO2 94 %. PHYSICAL EXAMINATION:  GENERAL:  72 y.o.-year-old patient lying in the bed with no acute distress.  EYES: Pupils equal, round, reactive to light and accommodation. No scleral icterus. Extraocular muscles intact.  HEENT: Head atraumatic, normocephalic. Oropharynx and nasopharynx clear.  NECK:  Supple, no jugular venous distention. No thyroid enlargement, no tenderness.  LUNGS: Normal breath sounds bilaterally, no wheezing, rales,rhonchi or crepitation. No use of accessory muscles of respiration.  CARDIOVASCULAR: S1, S2 normal. No murmurs, rubs, or gallops.  ABDOMEN: Soft, non-tender, non-distended. Bowel sounds present. No organomegaly or mass.    EXTREMITIES: No pedal edema, cyanosis, or clubbing.  NEUROLOGIC: Cranial nerves II through XII  are intact. Muscle strength 5/5 in all extremities. +mild numbness in the left arm compared to the right. Gait not checked.  PSYCHIATRIC: The patient is alert and oriented x 3.  SKIN: No obvious rash, lesion, or ulcer.  DATA REVIEW:   CBC Recent Labs  Lab 05/01/18 0828  WBC 6.7  HGB 13.0  HCT 36.9*  PLT 217    Chemistries  Recent Labs  Lab 04/30/18 1618 05/01/18 0828  NA 137 137  K 4.0 3.9  CL 103 101  CO2 22 23  GLUCOSE 192* 182*  BUN 23 18  CREATININE 0.96 0.82  CALCIUM 9.4 8.9  AST 41  --   ALT 32  --   ALKPHOS 92  --   BILITOT 0.7  --      Microbiology Results  Results for orders placed or performed during the hospital encounter of 02/09/18  Culture, blood (Routine x 2)     Status: None   Collection Time: 02/09/18  4:54 PM  Result Value Ref Range Status   Specimen Description BLOOD LEFT ANTECUBITAL  Final   Special Requests   Final    BOTTLES DRAWN AEROBIC AND ANAEROBIC Blood Culture results may not be optimal due to an excessive volume of blood received in culture bottles   Culture   Final    NO GROWTH 5 DAYS Performed at [facility], [address] [place], [place] [address]    Report Status 02/14/2018 FINAL  Final    RADIOLOGY:  Ct Head Wo Contrast  Result Date: 04/30/2018 CLINICAL DATA:  Left facial and upper extremity numbness EXAM: CT HEAD WITHOUT CONTRAST TECHNIQUE: Contiguous axial images were obtained from the base of the skull through the vertex without intravenous contrast. COMPARISON:  Feb 09, 2018 FINDINGS: Brain: Mild diffuse atrophy is stable. There is no intracranial mass, hemorrhage, extra-axial fluid collection, or midline shift. There is patchy small vessel disease in the centra semiovale bilaterally, stable. There is no new gray-white compartment lesion. No acute infarct is appreciable. Vascular: No hyperdense vessel. There is  calcification in each carotid siphon region. Skull: The bony calvarium appears intact. Sinuses/Orbits: There is mucosal thickening in several ethmoid air cells. Other visualized paranasal sinuses are clear. Orbits appear symmetric bilaterally. There has been bilateral cataract removal. Other: Mastoid air cells are clear. IMPRESSION: Atrophy with supratentorial small vessel disease, stable. No acute infarct. No mass or hemorrhage. There are foci of arterial vascular calcification. There is mucosal thickening in several ethmoid air cells. Electronically Signed   By: Lowella Grip III M.D.   On: 04/30/2018 17:07   Ct Angio Neck W Or Wo Contrast  Result Date: 04/30/2018 CLINICAL DATA:  72 year old male with left face and arm numbness. Left eye visual changes and eyelid droop. EXAM: CT ANGIOGRAPHY NECK TECHNIQUE: Multidetector CT imaging of the neck was performed using the standard protocol during bolus administration of intravenous contrast. Multiplanar CT image reconstructions and MIPs were obtained to evaluate the vascular anatomy. Carotid stenosis measurements (when applicable) are obtained utilizing NASCET criteria, using the distal internal carotid diameter as the denominator. CONTRAST:  65m ISOVUE-370 IOPAMIDOL (ISOVUE-370) INJECTION 76% COMPARISON:  Head CT earlier today. Brain MRI 12/15/2017. chest CT 01/23/2018. FINDINGS: Skeleton: Bulky right stylohyoid ligament calcification. Occasional dental caries. Lower cervical spine degeneration, with C6-C7 interbody ankylosis. Prior sternotomy. No acute osseous abnormality identified. Upper chest: No superior mediastinal lymphadenopathy. Partially visible main pulmonary artery enlargement such as pulmonary artery hypertension. Small calcified right upper lobe and right hilar granulomas.  Mild mosaic attenuation in the upper lungs, greater on the right. Other neck: Negative.  No neck mass or lymphadenopathy. Negative visible brain parenchyma. Aortic arch: Prior  CABG. Calcified aortic atherosclerosis. Four vessel arch configuration, the left vertebral artery arises directly from the arch. Right carotid system: No brachiocephalic or right CCA origin stenosis despite some calcified plaque. Mildly tortuous proximal right CCA. Minimal plaque at the right carotid bifurcation. Calcified plaque at the distal right bulb. No significant cervical right ICA stenosis. Visible right ICA siphon is patent with at least moderate calcified plaque. Left carotid system: No left CCA origin stenosis despite mild plaque. Tortuous left CCA at the thoracic inlet. Minimal cervical left carotid plaque otherwise. No stenosis to the skull base. Patent left ICA siphon with mild to moderate visible calcified plaque. Vertebral arteries: Mild proximal right subclavian artery plaque without stenosis. Normal right vertebral artery origin. The right vertebral artery appears dominant and is tortuous in the V2 and V3 segments. No right vertebral artery stenosis to the vertebrobasilar junction. The right AICA appears dominant and is patent. The left vertebral artery arises directly from the arch with no origin stenosis. The left vertebral is non dominant, mildly tortuous, and patent to the vertebrobasilar junction without stenosis. Patent left PICA origin. Review of the MIP images confirms the above findings IMPRESSION: 1. No arterial dissection or significant stenosis in the neck. Atherosclerosis is most pronounced in the right carotid. 2. Chronic main pulmonary artery enlargement suggesting pulmonary artery hypertension. Mosaic attenuation in the upper lungs today could reflect pulmonary small airway or small vessel disease. 3.  Aortic Atherosclerosis (ICD10-I70.0). 4. 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 Electronically Signed   By: Genevie Ann M.D.   On: 04/30/2018 20:35   Mr Brain Wo Contrast  Result Date: 04/30/2018 CLINICAL DATA:  72 year old  male with left face and arm numbness, left visual changes and eyelid droop. EXAM: MRI HEAD WITHOUT CONTRAST TECHNIQUE: Multiplanar, multiecho pulse sequences of the brain and surrounding structures were obtained without intravenous contrast. COMPARISON:  CTA neck and head CT without contrast earlier today. Brain MRI 12/15/2017. FINDINGS: Brain: No restricted diffusion to suggest acute infarction. No midline shift, mass effect, evidence of mass lesion, ventriculomegaly, extra-axial collection or acute intracranial hemorrhage. Cervicomedullary junction and pituitary are within normal limits. Patchy mostly periventricular cerebral white matter T2 and FLAIR hyperintensity is stable. No superimposed cortical encephalomalacia or definite chronic cerebral blood products. The deep gray matter nuclei, brainstem, and cerebellum remain within normal limits. Vascular: Major intracranial vascular flow voids are stable. Skull and upper cervical spine: Negative visible cervical spine. Normal bone marrow signal. Sinuses/Orbits: Stable orbits soft tissues with postoperative changes to both globes. Resolved paranasal sinus inflammation seen in April which was primarily on the left. Other: Mild bilateral mastoid effusions have also regressed. Visible internal auditory structures appear normal. Scalp and face soft tissues appear negative. IMPRESSION: 1.  No acute intracranial abnormality. Stable MRI appearance of the brain since April with mild to moderate for age nonspecific white matter changes. 2. Resolved paranasal sinus disease and mastoid effusions. Electronically Signed   By: Genevie Ann M.D.   On: 04/30/2018 22:04     Management plans discussed with the patient, family and they are in agreement.  CODE STATUS: DNR   TOTAL TIME TAKING CARE OF THIS PATIENT: 35 minutes.    Berna Spare Seleni Meller M.D on 05/01/2018 at 3:52 PM  Between 7am to 6pm - Pager - [phone]  After 6pm go to [email or web address] - Proofreader  [employer]  [facility]  Office  [phone]  CC: Primary care physician; McLean-Scocuzza, Nino Glow, MD   Note: This dictation was prepared with Dragon dictation along with smaller phrase technology. Any transcriptional errors that result from this process are unintentional.

## 2018-05-01 NOTE — Progress Notes (Signed)
Pt refused long acting insulin offered by pharmacy. Pt's wife to bring Home Insulin tomorrow morning. MD notified.

## 2018-05-01 NOTE — Progress Notes (Signed)
*  PRELIMINARY RESULTS* Echocardiogram 2D Echocardiogram has been performed.  Chase Cabrera 05/01/2018, 12:29 PM

## 2018-05-01 NOTE — Consult Note (Signed)
Referring Physician: Loletha Grayer, MD    Chief Complaint: Left side and facial numbness, vision disturbance  HPI: Chase Cabrera is an 72 y.o. male seen in consultation with admitting physician for evaluation of left side facial, arm and leg numbness concerning for CVA.  Patient state that he was in his usual state of health on the morning of 04/30/2018 when he suddenly developed left sided facial and arm numbness at about 9.30 am. He report that symptoms lasted about 5 minutes and then resolved. He had a second similar episode  at about 2:30 pm involving the left face, arm, and leg numbness. He state that this time his left thumb would not work properly, he was unable to hold object or bend it. He endorsed associated symptoms of left vision blurriness and drooping which has improved. Denies any other associated focal neurological symptoms. His wife report that he has history of Bell's palsy which was different in presentation so she called EMS due to concerns of stroke.  He had a stroke work up including  CT head and neck which was negative for acute abnormality. Follow up MRI brain did not show any acute abnormality as well.   Date last known well: Date: 04/30/2018 Time last known well: Time: 09:30 tPA Given: No: Outside window period, history of hematoma  Past Medical History:  Diagnosis Date  . Arthritis   . Atrial fibrillation ([place])   . CAD (coronary artery disease)    6 STENTS  . CHF (congestive heart failure) ([place])   . Chicken pox   . Colon polyp   . Corneal dystrophy    bilateral  . Crohn's disease ([place])   . Diabetes ([place]) 2002  . Dysrhythmia    A-FIB AND PAF  . GERD (gastroesophageal reflux disease)   . Gout   . History of kidney stones   . History of shingles   . Hyperlipemia   . Hypertension   . Kidney stones   . Myocardial infarction ([place])    x4, last one in 2010  . Peripheral neuropathy   . Peripheral neuropathy   . Peripheral neuropathy   . PUD (peptic ulcer  disease)   . Rectus sheath hematoma    02/18/18 8.9 x 4.5 cm then 02/19/18 7.9 x 4.5 cm   . Renal artery stenosis ([place])   . Renal artery stenosis ([place])   . Salzmann's nodular dystrophy 2012  . Sleep apnea   . Spinal stenosis     Past Surgical History:  Procedure Laterality Date  . ABLATION  2012  . APPLICATION VERTERBRAL DEFECT PROSTHETIC  05/01/2013  . ARTHRODESIS ANTERIOR LUMBAR SPINE  05/01/2013  . BACK SURGERY     lumbar fusion  . CARDIAC CATHETERIZATION    . CARDIAC ELECTROPHYSIOLOGY STUDY AND ABLATION    . CARDIAC SURGERY    . CARDIOVERSION    . CHOLECYSTECTOMY    . COLONOSCOPY WITH PROPOFOL N/A 04/09/2016   Completed for chronic diarrhea.  Few scattered diverticuli.  No mucosal lesions.  Surgeon: Lollie Sails, MD;  Location[facility]OSCOPY;  Service: Endoscopy;  Laterality: N/A;  . CORNEAL EYE SURGERY Bilateral 07/28/11    09/22/2011  . CORONARY ANGIOPLASTY    . CORONARY ANGIOPLASTY WITH STENT PLACEMENT  03/28/2015   Distal 80% to normal Stent, Dilation Balloon  . CORONARY ARTERY BYPASS GRAFT     4 VESSELS  . EYE SURGERY     bilateral cataract 2017 Dr. Megan Mans  . foot and ankle repair Right  2005  . FRACTURE SURGERY     ANKLE PLATE AND SCREWS  . GALLBLADER    . JOINT REPLACEMENT     left total hip 11/11/15  . JOINT REPLACEMENT     right total hip 12/2017 Dr. Rudene Christians   . KNEE ARTHROSCOPY Right   . LUMBAR SPINE FUSION ONE LEVEL  05/03/2013  . RENAL ARTERY STENT Right   . ROTATOR CUFF REPAIR Right 2006  . TONSILLECTOMY    . TOTAL HIP ARTHROPLASTY Left 11/11/2015   Procedure: TOTAL HIP ARTHROPLASTY ANTERIOR APPROACH;  Surgeon: Hessie Knows, MD;  Location: [facility] ORS;  Service: Orthopedics;  Laterality: Left;  . TOTAL HIP ARTHROPLASTY Right 12/13/2017   Procedure: TOTAL HIP ARTHROPLASTY ANTERIOR APPROACH;  Surgeon: Hessie Knows, MD;  Location: [facility] ORS;  Service: Orthopedics;  Laterality: Right;  . TRANSCATH PLACEMENT INTRAVASCULAR STENT LEG  03/2015    Family History   Problem Relation Age of Onset  . CVA Father   . Stroke Father   . Lung cancer Mother   . Arthritis Mother   . Stomach cancer Sister   . Colon cancer Sister   . Diabetes Brother    Social History:  reports that he quit smoking about 55 years ago. His smoking use included cigarettes. He has a 3.00 pack-year smoking history. He quit smokeless tobacco use about 49 years ago.  His smokeless tobacco use included chew. He reports that he does not drink alcohol or use drugs.  Allergies:  Allergies  Allergen Reactions  . Allopurinol Diarrhea, Other (See Comments) and Nausea And Vomiting    Other Reaction: GI Upset  . Atenolol Other (See Comments)    Other Reaction: bradycardia  . Atorvastatin Other (See Comments)    Other Reaction: muscle aches  . Ramipril Rash  . Rosuvastatin Other (See Comments)    Muscle aches  . Simvastatin Other (See Comments)    Other Reaction: MUSCLE ACHES (Zocor)  . Valsartan Other (See Comments)    Other Reaction: facial swelling  . Cardizem [Diltiazem] Rash    Medications:  I have reviewed the patient's current medications. Scheduled: . amLODipine  5 mg Oral BID  . aspirin EC  81 mg Oral Daily  . carvedilol  25 mg Oral BID WC  . cholestyramine light  4 g Oral Q lunch  . clopidogrel  75 mg Oral Daily  . colchicine  0.6 mg Oral Daily  . dicyclomine  10 mg Oral TID AC  . enoxaparin (LOVENOX) injection  40 mg Subcutaneous Q24H  . gabapentin  600 mg Oral TID  . hydrALAZINE  100 mg Oral Q8H  . insulin aspart  0-15 Units Subcutaneous TID WC  . insulin aspart  0-5 Units Subcutaneous QHS  . insulin aspart  6 Units Subcutaneous TID WC  . insulin glargine  70 Units Subcutaneous QHS  . isosorbide mononitrate  120 mg Oral Daily  . loratadine  10 mg Oral Daily  . montelukast  10 mg Oral QHS  . multivitamin with minerals  1 tablet Oral Daily  . pantoprazole  40 mg Oral BID  . pravastatin  40 mg Oral Daily  . probenecid  500 mg Oral BID    ROS: History  obtained from the patient   General ROS: negative for - chills, fatigue, fever, night sweats, weight gain or weight loss Psychological ROS: negative for - behavioral disorder, hallucinations, mood swings or suicidal ideation. Positive for memory difficulties Ophthalmic ROS: negative for - double vision, eye pain or loss of  vision. Positive for  blurry vision, ENT ROS: negative for - epistaxis, nasal discharge, oral lesions, sore throat, tinnitus or vertigo Allergy and Immunology ROS: negative for - hives or itchy/watery eyes Hematological and Lymphatic ROS: negative for - bleeding problems, bruising or swollen lymph nodes Endocrine ROS: negative for - galactorrhea, hair pattern changes, polydipsia/polyuria or temperature intolerance Respiratory ROS: negative for - cough, hemoptysis, shortness of breath or wheezing Cardiovascular ROS: negative for - chest pain, dyspnea on exertion, edema or irregular heartbeat Gastrointestinal ROS: negative for - abdominal pain, diarrhea, hematemesis, nausea/vomiting or stool incontinence Genito-Urinary ROS: negative for - dysuria, hematuria, incontinence or urinary frequency/urgency Musculoskeletal ROS: negative for - joint swelling or muscular weakness Neurological ROS: as noted in HPI Dermatological ROS: negative for rash and skin lesion changes  Physical Examination: Blood pressure (!) 151/54, pulse (!) 57, temperature 97.8 F (36.6 C), temperature source Oral, resp. rate 16, height 5' 5"  (1.651 m), weight 87.7 kg, SpO2 94 %.  General Exam Patient looks appropriate of age, well built, nourished and appropriately groomed.  Cardiovascular Exam: S1, S2 heart sounds present Carotid exam revealed no bruit Lung exam was clear to auscultation Very hard of hearing biaterally ? Neurological Exam  Mental Status: Alert, Oriented to time, place, person and situation Attention span and concentration seemed appropriate Memory seemed OK. Intact naming,  repetition, comprehension.  Followed 2 step commands - no dysarthria Fund of knowledge seemed appropriate for age and health status.  Cranial Nerves: I. Olfactory not examined II: Visual fields were full. Pupils were equal, round and reactive to light and accommodation III,IV, VI: ptosis present in left eye, extra-ocular motions intact bilaterally V,VII: smile symmetric, facial light touch sensation decreased in left face including lips VIII: Finger rub was heard symmetric in both ears IX, X: Palate and uvular movements are normal and oral sensations are OK, gag reflex deffered XI: Neck muscle strength and shoulder shrug is normal XII: midline tongue extension  Motor Exam: Tone is normal in all extremities Muscle strength in all extremities is 5/5. No abnormal movements, fasciculations or atrophy seen  Deep Tendon Reflexes: Right Biceps is 2+, Left Biceps is 2+ Right Triceps is 2+, Left Triceps is 2+ Right Brachioradialis is 2+, Left Brachioradialis is 2+ Right Knee Jerk is 2+, Left Knee Jerk is 2+ Right Ankle Jerk is 2+, Left Ankle Jerk is 2+ Right Toes are down going, Left Toes are down going  Sensory Exam: Sensations were decreased to light touch in left arm, hand and leg Vibration and proprioception are also intact  Co-ordination: Finger to nose is normal  Gait: Gait and station are slow and unsteady at baseline uses a walker and scooter.  Data Reviewed  Laboratory Studies:  Basic Metabolic Panel: Recent Labs  Lab 04/30/18 1618  NA 137  K 4.0  CL 103  CO2 22  GLUCOSE 192*  BUN 23  CREATININE 0.96  CALCIUM 9.4    Liver Function Tests: Recent Labs  Lab 04/30/18 1618  AST 41  ALT 32  ALKPHOS 92  BILITOT 0.7  PROT 8.0  ALBUMIN 4.1   No results for input(s): LIPASE, AMYLASE in the last 168 hours. No results for input(s): AMMONIA in the last 168 hours.  CBC: Recent Labs  Lab 04/30/18 1618  WBC 8.1  NEUTROABS 5.4  HGB 13.1  HCT 37.7*  MCV 95.7   PLT 249    Cardiac Enzymes: Recent Labs  Lab 04/30/18 1618  TROPONINI <0.03    BNP: Invalid input(s): POCBNP  CBG: Recent Labs  Lab 04/30/18 1934 04/30/18 2246  GLUCAP 99 183*    Microbiology: Results for orders placed or performed during the hospital encounter of 02/09/18  Culture, blood (Routine x 2)     Status: None   Collection Time: 02/09/18  4:54 PM  Result Value Ref Range Status   Specimen Description BLOOD LEFT ANTECUBITAL  Final   Special Requests   Final    BOTTLES DRAWN AEROBIC AND ANAEROBIC Blood Culture results may not be optimal due to an excessive volume of blood received in culture bottles   Culture   Final    NO GROWTH 5 DAYS Performed at [facility], [address], [place], [place] [address]    Report Status 02/14/2018 FINAL  Final    Coagulation Studies: Recent Labs    04/30/18 1618  LABPROT 13.5  INR 1.04    Urinalysis: No results for input(s): COLORURINE, LABSPEC, PHURINE, GLUCOSEU, HGBUR, BILIRUBINUR, KETONESUR, PROTEINUR, UROBILINOGEN, NITRITE, LEUKOCYTESUR in the last 168 hours.  Invalid input(s): APPERANCEUR  Lipid Panel:    Component Value Date/Time   CHOL 144 05/01/2018 0410   TRIG 609 (H) 05/01/2018 0410   HDL 26 (L) 05/01/2018 0410   CHOLHDL 5.5 05/01/2018 0410   VLDL UNABLE TO CALCULATE IF TRIGLYCERIDE OVER 400 mg/dL 05/01/2018 0410   LDLCALC UNABLE TO CALCULATE IF TRIGLYCERIDE OVER 400 mg/dL 05/01/2018 0410    HgbA1C:  Lab Results  Component Value Date   HGBA1C 7.1 (H) 04/30/2018    Urine Drug Screen:  No results found for: LABOPIA, COCAINSCRNUR, LABBENZ, AMPHETMU, THCU, LABBARB  Alcohol Level: No results for input(s): ETH in the last 168 hours.  Other results: EKG: Normal sinus rhythm 64 bpm, left axis deviation  Imaging: Ct Head Wo Contrast  Result Date: 04/30/2018 CLINICAL DATA:  Left facial and upper extremity numbness EXAM: CT HEAD WITHOUT CONTRAST TECHNIQUE: Contiguous axial images were  obtained from the base of the skull through the vertex without intravenous contrast. COMPARISON:  Feb 09, 2018 FINDINGS: Brain: Mild diffuse atrophy is stable. There is no intracranial mass, hemorrhage, extra-axial fluid collection, or midline shift. There is patchy small vessel disease in the centra semiovale bilaterally, stable. There is no new gray-white compartment lesion. No acute infarct is appreciable. Vascular: No hyperdense vessel. There is calcification in each carotid siphon region. Skull: The bony calvarium appears intact. Sinuses/Orbits: There is mucosal thickening in several ethmoid air cells. Other visualized paranasal sinuses are clear. Orbits appear symmetric bilaterally. There has been bilateral cataract removal. Other: Mastoid air cells are clear. IMPRESSION: Atrophy with supratentorial small vessel disease, stable. No acute infarct. No mass or hemorrhage. There are foci of arterial vascular calcification. There is mucosal thickening in several ethmoid air cells. Electronically Signed   By: Lowella Grip III M.D.   On: 04/30/2018 17:07   Ct Angio Neck W Or Wo Contrast  Result Date: 04/30/2018 CLINICAL DATA:  72 year old male with left face and arm numbness. Left eye visual changes and eyelid droop. EXAM: CT ANGIOGRAPHY NECK TECHNIQUE: Multidetector CT imaging of the neck was performed using the standard protocol during bolus administration of intravenous contrast. Multiplanar CT image reconstructions and MIPs were obtained to evaluate the vascular anatomy. Carotid stenosis measurements (when applicable) are obtained utilizing NASCET criteria, using the distal internal carotid diameter as the denominator. CONTRAST:  26m ISOVUE-370 IOPAMIDOL (ISOVUE-370) INJECTION 76% COMPARISON:  Head CT earlier today. Brain MRI 12/15/2017. chest CT 01/23/2018. FINDINGS: Skeleton: Bulky right stylohyoid ligament calcification. Occasional dental caries.  Lower cervical spine degeneration, with C6-C7  interbody ankylosis. Prior sternotomy. No acute osseous abnormality identified. Upper chest: No superior mediastinal lymphadenopathy. Partially visible main pulmonary artery enlargement such as pulmonary artery hypertension. Small calcified right upper lobe and right hilar granulomas. Mild mosaic attenuation in the upper lungs, greater on the right. Other neck: Negative.  No neck mass or lymphadenopathy. Negative visible brain parenchyma. Aortic arch: Prior CABG. Calcified aortic atherosclerosis. Four vessel arch configuration, the left vertebral artery arises directly from the arch. Right carotid system: No brachiocephalic or right CCA origin stenosis despite some calcified plaque. Mildly tortuous proximal right CCA. Minimal plaque at the right carotid bifurcation. Calcified plaque at the distal right bulb. No significant cervical right ICA stenosis. Visible right ICA siphon is patent with at least moderate calcified plaque. Left carotid system: No left CCA origin stenosis despite mild plaque. Tortuous left CCA at the thoracic inlet. Minimal cervical left carotid plaque otherwise. No stenosis to the skull base. Patent left ICA siphon with mild to moderate visible calcified plaque. Vertebral arteries: Mild proximal right subclavian artery plaque without stenosis. Normal right vertebral artery origin. The right vertebral artery appears dominant and is tortuous in the V2 and V3 segments. No right vertebral artery stenosis to the vertebrobasilar junction. The right AICA appears dominant and is patent. The left vertebral artery arises directly from the arch with no origin stenosis. The left vertebral is non dominant, mildly tortuous, and patent to the vertebrobasilar junction without stenosis. Patent left PICA origin. Review of the MIP images confirms the above findings IMPRESSION: 1. No arterial dissection or significant stenosis in the neck. Atherosclerosis is most pronounced in the right carotid. 2. Chronic main  pulmonary artery enlargement suggesting pulmonary artery hypertension. Mosaic attenuation in the upper lungs today could reflect pulmonary small airway or small vessel disease. 3.  Aortic Atherosclerosis (ICD10-I70.0). 4. 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 Electronically Signed   By: Genevie Ann M.D.   On: 04/30/2018 20:35   Mr Brain Wo Contrast  Result Date: 04/30/2018 CLINICAL DATA:  72 year old male with left face and arm numbness, left visual changes and eyelid droop. EXAM: MRI HEAD WITHOUT CONTRAST TECHNIQUE: Multiplanar, multiecho pulse sequences of the brain and surrounding structures were obtained without intravenous contrast. COMPARISON:  CTA neck and head CT without contrast earlier today. Brain MRI 12/15/2017. FINDINGS: Brain: No restricted diffusion to suggest acute infarction. No midline shift, mass effect, evidence of mass lesion, ventriculomegaly, extra-axial collection or acute intracranial hemorrhage. Cervicomedullary junction and pituitary are within normal limits. Patchy mostly periventricular cerebral white matter T2 and FLAIR hyperintensity is stable. No superimposed cortical encephalomalacia or definite chronic cerebral blood products. The deep gray matter nuclei, brainstem, and cerebellum remain within normal limits. Vascular: Major intracranial vascular flow voids are stable. Skull and upper cervical spine: Negative visible cervical spine. Normal bone marrow signal. Sinuses/Orbits: Stable orbits soft tissues with postoperative changes to both globes. Resolved paranasal sinus inflammation seen in April which was primarily on the left. Other: Mild bilateral mastoid effusions have also regressed. Visible internal auditory structures appear normal. Scalp and face soft tissues appear negative. IMPRESSION: 1.  No acute intracranial abnormality. Stable MRI appearance of the brain since April with mild to moderate for age nonspecific  white matter changes. 2. Resolved paranasal sinus disease and mastoid effusions. Electronically Signed   By: Genevie Ann M.D.   On: 04/30/2018 22:04    Assessment: 72 y.o. male presenting with focal  symptoms of left sided and facial numbness with blurry vision concerning for stroke. MRI brain personally reviewed which did not show acute intracranial abnormality.  CTA head and neck did not show evidence of arterial dissection or significant stenosis. He has had prior work up with Echocardiogram 4/19 which did not show any significant abnormality, no evidence of PFO, significant valve disease or left atrial abnormalities. Symptoms likely represent vascular event however cannot completely rule out neuropathic causes due to history of diabetes with noted elevated hgbA1c compared to previous. He may need further work up out patient with EMG/NCS as well He has history of Bell's palsy, however have very low suspicion of this given the presentation involving extremities. He has history of atrial fibrillation status post ablation, was on coumadin however this was stopped due to hematoma. He is currently on Aspirin and Plavix and low dose pravastatin. He is intolerant to several statins.  HgbA1c - 7.1, fasting lipid panel abnormal with elevated Triglycerides and low HLD unable to calculate LDL.He has followed with outpatient Neurology Dr. Manuella Ghazi for confusion and memory problems without other focal neurological deficit. He had EEG done on 03/02/2018 which did not show any evidence of epileptiform discharges.   Stroke Risk Factors - atrial fibrillation, diabetes mellitus, hyperlipidemia and hypertension  Plan: 1. Echocardiogram pending 2. PT consult, OT consult, Speech consult 3. Recommend follow up with outpatient neurology also for consideration of EMG/NCS of the left side 4. Continue aggressive medical management with dual therapy Aspirin 81 mg/day and Plavix 75 mg /day with intensive management of vascular risk factor  to keep systolic BP (SBP) <920 mm Hg (130 mm Hg if diabetic) and low density lipoprotein (LDL) <70 mg/dl, and lifestyle modification. Continue high potency statin. 5. NPO until RN stroke swallow screen 6. Telemetry monitoring 7. Frequent neuro checks   This patient was staffed with Dr. Irish Elders, Alease Frame who personally evaluated patient, reviewed documentation and agreed with assessment and plan of care as above.  Rufina Falco, DNP, FNP-BC Board certified Nurse Practitioner Neurology Department  05/01/2018, 8:37 AM

## 2018-05-02 ENCOUNTER — Telehealth: Payer: Self-pay | Admitting: Urology

## 2018-05-02 DIAGNOSIS — I10 Essential (primary) hypertension: Secondary | ICD-10-CM | POA: Diagnosis not present

## 2018-05-02 DIAGNOSIS — Z8679 Personal history of other diseases of the circulatory system: Secondary | ICD-10-CM | POA: Diagnosis not present

## 2018-05-02 DIAGNOSIS — E119 Type 2 diabetes mellitus without complications: Secondary | ICD-10-CM | POA: Diagnosis not present

## 2018-05-02 DIAGNOSIS — G459 Transient cerebral ischemic attack, unspecified: Secondary | ICD-10-CM | POA: Diagnosis not present

## 2018-05-02 DIAGNOSIS — R200 Anesthesia of skin: Secondary | ICD-10-CM | POA: Diagnosis not present

## 2018-05-02 LAB — GLUCOSE, CAPILLARY
Glucose-Capillary: 217 mg/dL — ABNORMAL HIGH (ref 70–99)
Glucose-Capillary: 224 mg/dL — ABNORMAL HIGH (ref 70–99)

## 2018-05-02 LAB — ECHOCARDIOGRAM COMPLETE
Height: 65 in
Weight: 3094.4 oz

## 2018-05-02 NOTE — Discharge Summary (Signed)
Chase Cabrera at [place] NAME: Chase Cabrera    MR#:  [id number]  DATE OF BIRTH:  March 18, 1946  DATE OF ADMISSION:  04/30/2018   ADMITTING PHYSICIAN: Loletha Grayer, MD  DATE OF DISCHARGE: 05/02/18  PRIMARY CARE PHYSICIAN: McLean-Scocuzza, Nino Glow, MD   ADMISSION DIAGNOSIS:  TIA symptoms DISCHARGE DIAGNOSIS:  Active Problems:   TIA (transient ischemic attack)  SECONDARY DIAGNOSIS:   Past Medical History:  Diagnosis Date  . Arthritis   . Atrial fibrillation ([place])   . CAD (coronary artery disease)    6 STENTS  . CHF (congestive heart failure) ([place])   . Chicken pox   . Colon polyp   . Corneal dystrophy    bilateral  . Crohn's disease ([place])   . Diabetes ([place]) 2002  . Dysrhythmia    A-FIB AND PAF  . GERD (gastroesophageal reflux disease)   . Gout   . History of kidney stones   . History of shingles   . Hyperlipemia   . Hypertension   . Kidney stones   . Myocardial infarction ([place])    x4, last one in 2010  . Peripheral neuropathy   . Peripheral neuropathy   . Peripheral neuropathy   . PUD (peptic ulcer disease)   . Rectus sheath hematoma    02/18/18 8.9 x 4.5 cm then 02/19/18 7.9 x 4.5 cm   . Renal artery stenosis ([place])   . Renal artery stenosis ([place])   . Salzmann's nodular dystrophy 2012  . Sleep apnea   . Spinal stenosis    HOSPITAL COURSE:   Chase Cabrera is a 72 year old male with a PMH of CAD with hx of MI x4, hx a-fib s/p ablation, crohn's disease, type 2 diabetes, HTN who presented to the ED with left arm and left facial numbness. He was admitted for further work-up.  1.  TIA with left arm numbness and left facial numbness- symptoms completely resolved on the day of discharge  - CT head negative for acute infarct - CTA neck without significant stenosis - MRI brain without acute infarct - On Plavix at home, aspirin 76m added this admission - Lipid panel with TG 609, unable to calculate LDL- continued Pravastatin and  added Lovaza bid - ECHO unremarkable - Neurology consulted- recommended possible EMG/NCS as outpatient - PT/OT consulted- recommended home health PT/OT  2.  Hypertension- blood pressures well-controlled - home coreg, norvasc, and hydralazine were continued    3.  Type 2 diabetes mellitus- blood sugars well-controlled - on lantus and sliding scale insulin during admission - no changes made to home meds on discharge.  4.  History of CAD- stable, no active chest pain this admission - home plavix, coreg, and statin continued - aspirin 885madded  5.  Hyperlipidemia- uncontrolled - home pravastatin continued - lovaza added for TG 691  DISCHARGE CONDITIONS:  TIA- resolved HTN T2DM CAD HLD Crohn's disease CONSULTS OBTAINED:  Treatment Team:  ReAlexis GoodellMD CaYolonda KidaMD DRUG ALLERGIES:   Allergies  Allergen Reactions  . Allopurinol Diarrhea, Other (See Comments) and Nausea And Vomiting    Other Reaction: GI Upset  . Atenolol Other (See Comments)    Other Reaction: bradycardia  . Atorvastatin Other (See Comments)    Other Reaction: muscle aches  . Ramipril Rash  . Rosuvastatin Other (See Comments)    Muscle aches  . Simvastatin Other (See Comments)    Other Reaction: MUSCLE ACHES ([place])  . Valsartan Other (  See Comments)    Other Reaction: facial swelling  . Cardizem [Diltiazem] Rash   DISCHARGE MEDICATIONS:   Allergies as of 05/02/2018      Reactions   Allopurinol Diarrhea, Other (See Comments), Nausea And Vomiting   Other Reaction: GI Upset   Atenolol Other (See Comments)   Other Reaction: bradycardia   Atorvastatin Other (See Comments)   Other Reaction: muscle aches   Ramipril Rash   Rosuvastatin Other (See Comments)   Muscle aches   Simvastatin Other (See Comments)   Other Reaction: MUSCLE ACHES (Zocor)   Valsartan Other (See Comments)   Other Reaction: facial swelling   Cardizem [diltiazem] Rash      Medication List    STOP taking  these medications   LEVEMIR FLEXTOUCH 100 UNIT/ML Pen Generic drug:  Insulin Detemir     TAKE these medications   amLODipine 5 MG tablet Commonly known as:  NORVASC Take 1 tablet (5 mg total) by mouth 2 (two) times daily. If blood pressure >140/>90 make take another 1 pill of 5 mg What changed:  additional instructions   aspirin 81 MG EC tablet Take 1 tablet (81 mg total) by mouth daily.   BENTYL 10 MG capsule Generic drug:  dicyclomine Take 10 mg by mouth 3 (three) times daily before meals.   carvedilol 25 MG tablet Commonly known as:  COREG Take 1 tablet (25 mg total) by mouth 2 (two) times daily with a meal.   Cholecalciferol 50000 units Tabs Take 1 tablet by mouth once a week. What changed:    how much to take  when to take this   cholestyramine light 4 g packet Commonly known as:  PREVALITE Take 4 g by mouth daily with lunch.   clopidogrel 75 MG tablet Commonly known as:  PLAVIX Take 75 mg by mouth daily.   colchicine 0.6 MG tablet TAKE 1 TABLET BY MOUTH ONCE DAILY   diclofenac sodium 1 % Gel Commonly known as:  VOLTAREN Apply 2 g topically 4 (four) times daily as needed (for pain).   gabapentin 300 MG capsule Commonly known as:  NEURONTIN take 2 capsules (641m) by mouth three times a day   hydrALAZINE 100 MG tablet Commonly known as:  APRESOLINE TAKE 100 MG BY MOUTH THREE TIMES A DAY   HYDROcodone-acetaminophen 5-325 MG tablet Commonly known as:  NORCO/VICODIN Take 1 tablet by mouth every 4 (four) hours as needed for severe pain (try not to take more than 2 a day.).   insulin lispro 100 UNIT/ML KiwkPen Commonly known as:  HUMALOG Inject 14-32 Units into the skin 3 (three) times daily. (according to sliding scale - max 96u over 24 hours)   ipratropium 0.06 % nasal spray Commonly known as:  ATROVENT Place 2 sprays into both nostrils 4 (four) times daily.   isosorbide mononitrate 120 MG 24 hr tablet Commonly known as:  IMDUR TAKE 1 TABLET BY  MOUTH EVERY DAY   levocetirizine 5 MG tablet Commonly known as:  XYZAL Take 1 tablet (5 mg total) by mouth every evening.   metFORMIN 1000 MG tablet Commonly known as:  GLUCOPHAGE Take 1,000 mg by mouth 2 (two) times daily with a meal.   montelukast 10 MG tablet Commonly known as:  SINGULAIR Take 1 tablet (10 mg total) by mouth at bedtime.   MULTI-VITAMINS Tabs Take 1 tablet by mouth daily.   nitroGLYCERIN 0.4 MG SL tablet Commonly known as:  NITROSTAT Place 1 tablet (0.4 mg total) under the tongue every  5 (five) minutes x 3 doses as needed. For chest pain.  Call 911 if no relief after 3 tablets   omega-3 acid ethyl esters 1 g capsule Commonly known as:  LOVAZA Take 2 capsules (2 g total) by mouth 2 (two) times daily.   omeprazole 40 MG capsule Commonly known as:  PRILOSEC Take 40 mg by mouth 2 (two) times daily.   pravastatin 40 MG tablet Commonly known as:  PRAVACHOL Take 1 tablet (40 mg total) by mouth daily.   probenecid 500 MG tablet Commonly known as:  BENEMID Take 500 mg by mouth 2 (two) times daily.   TRESIBA FLEXTOUCH 200 UNIT/ML Sopn Generic drug:  Insulin Degludec Inject 90 Units into the skin at bedtime.   vitamin C 500 MG tablet Commonly known as:  ASCORBIC ACID Take 500 mg by mouth daily.   Vitamin E 400 units Tabs Take 400 Units by mouth 2 (two) times daily.        DISCHARGE INSTRUCTIONS:  1. F/u with PCP in 1-2 weeks 2. F/u with neurology in 1-2 weeks 3. Already on plavix- aspirin added this admission 4. TG 691- added lovaza 5. Neuro consult recommended considering possible EMG/NCS as an outpatient DIET:  Heart healthy, diabetic diet DISCHARGE CONDITION:  Stable ACTIVITY:  Activity as tolerated OXYGEN:  Home Oxygen: No.  Oxygen Delivery: room air DISCHARGE LOCATION:  home   If you experience worsening of your admission symptoms, develop shortness of breath, life threatening emergency, suicidal or homicidal thoughts you must seek  medical attention immediately by calling 911 or calling your MD immediately  if symptoms less severe.  You Must read complete instructions/literature along with all the possible adverse reactions/side effects for all the Medicines you take and that have been prescribed to you. Take any new Medicines after you have completely understood and accpet all the possible adverse reactions/side effects.   Please note  You were cared for by a hospitalist during your hospital stay. If you have any questions about your discharge medications or the care you received while you were in the hospital after you are discharged, you can call the unit and asked to speak with the hospitalist on call if the hospitalist that took care of you is not available. Once you are discharged, your primary care physician will handle any further medical issues. Please note that NO REFILLS for any discharge medications will be authorized once you are discharged, as it is imperative that you return to your primary care physician (or establish a relationship with a primary care physician if you do not have one) for your aftercare needs so that they can reassess your need for medications and monitor your lab values.    On the day of Discharge:  Seen and examined today. Left arm and left face numbness have completely resolved. Chase Cabrera is ready for discharge home. VITAL SIGNS:  Blood pressure (!) 145/57, pulse (!) 52, temperature 97.9 F (36.6 C), temperature source Oral, resp. rate 16, height 5' 5"  (1.651 m), weight 87.7 kg, SpO2 92 %. PHYSICAL EXAMINATION:  GENERAL:  72 y.o.-year-old Chase Cabrera lying in the bed with no acute distress.  EYES: Pupils equal, round, reactive to light and accommodation. No scleral icterus. Extraocular muscles intact.  HEENT: Head atraumatic, normocephalic. Oropharynx and nasopharynx clear.  NECK:  Supple, no jugular venous distention. No thyroid enlargement, no tenderness.  LUNGS: Normal breath sounds  bilaterally, no wheezing, rales,rhonchi or crepitation. No use of accessory muscles of respiration.  CARDIOVASCULAR: S1, S2 normal.  No murmurs, rubs, or gallops.  ABDOMEN: Soft, non-tender, non-distended. Bowel sounds present. No organomegaly or mass.  EXTREMITIES: No pedal edema, cyanosis, or clubbing.  NEUROLOGIC: Cranial nerves II through XII are intact. Muscle strength 5/5 in all extremities. Sensation intact to light touch throughout. Gait not checked.  PSYCHIATRIC: The Chase Cabrera is alert and oriented x 3.  SKIN: No obvious rash, lesion, or ulcer.  DATA REVIEW:   CBC Recent Labs  Lab 05/01/18 0828  WBC 6.7  HGB 13.0  HCT 36.9*  PLT 217    Chemistries  Recent Labs  Lab 04/30/18 1618 05/01/18 0828  NA 137 137  K 4.0 3.9  CL 103 101  CO2 22 23  GLUCOSE 192* 182*  BUN 23 18  CREATININE 0.96 0.82  CALCIUM 9.4 8.9  AST 41  --   ALT 32  --   ALKPHOS 92  --   BILITOT 0.7  --      Microbiology Results  Results for orders placed or performed during the hospital encounter of 02/09/18  Culture, blood (Routine x 2)     Status: None   Collection Time: 02/09/18  4:54 PM  Result Value Ref Range Status   Specimen Description BLOOD LEFT ANTECUBITAL  Final   Special Requests   Final    BOTTLES DRAWN AEROBIC AND ANAEROBIC Blood Culture results may not be optimal due to an excessive volume of blood received in culture bottles   Culture   Final    NO GROWTH 5 DAYS Performed at [facility], [address] [place], [place] [address]    Report Status 02/14/2018 FINAL  Final    RADIOLOGY:  No results found.   Management plans discussed with the Chase Cabrera, family and they are in agreement.  CODE STATUS: DNR   TOTAL TIME TAKING CARE OF THIS Chase Cabrera: 35 minutes.    Berna Spare Mayo M.D on 05/02/2018 at 2:58 PM  Between 7am to 6pm - Pager [phone]  After 6pm go to [email or web address] - Proofreader  [facility]  Office   [phone]  CC: Primary care physician; McLean-Scocuzza, Nino Glow, MD   Note: This dictation was prepared with Dragon dictation along with smaller phrase technology. Any transcriptional errors that result from this process are unintentional.

## 2018-05-02 NOTE — Care Management Note (Signed)
Case Management Note  Patient Details  Name: Chase Cabrera MRN: [id number] Date of Birth: 08-30-1946  Subjective/Objective: Spoke with patient spouse. She states patient will need a walker. Ordered and it has been delivered by [facility] with [facility]. They prefer to use [facility] for home health PT/OT. Faxed referral to [facility]. Wife updated. No other needs. Patient will be discharged home by car. PCP is Dr. Terese Door.                   Action/Plan: [place] for PT/OT. Walker from [place].   Expected Discharge Date:  05/02/18               Expected Discharge Plan:  [place]  In-House Referral:     Discharge planning Services  CM Consult  Post Acute Care Choice:  Durable Medical Equipment, Home Health Choice offered to:  Spouse  DME Arranged:  [place]ford Rile rolling DME Agency:  [place] Arranged:  OT, PT [facility] Agency:  [place]  Status of Service:  Completed, signed off  If discussed at [place] of Stay Meetings, dates discussed:    Additional Comments:  Jolly Mango, RN 05/02/2018, 11:19 AM

## 2018-05-02 NOTE — Progress Notes (Signed)
Dr. Clayborn Bigness to read echo. Madlyn Frankel, RN

## 2018-05-02 NOTE — Telephone Encounter (Signed)
Pt's wife called office to inform us pt was in hospital at [facility].  He had an appt today at [facility] in [place], but she didn't have their number.  I called [facility] to let them know pt was in hospital.

## 2018-05-02 NOTE — Progress Notes (Signed)
Discharge instructions given and went over with patient and patients wife at bedside. Prescriptions reviewed. All questions answered. Patient discharged home with wife via wheelchair by volunteer services. Madlyn Frankel, RN

## 2018-05-04 ENCOUNTER — Telehealth: Payer: Self-pay

## 2018-05-04 NOTE — Telephone Encounter (Signed)
Transition Care Management Follow-up Telephone Call   Date discharged? 05/02/18   How have you been since you were released from the hospital? [place].  RESTING WELL. APPETITE GOOD. NO NEW ISSUES PRESENTING.   Do you understand why you were in the hospital? YES, STROKE.   Do you understand the discharge instructions? YES, FOLLOW UP WITH PCP AND NEUROLOGY 1-2 WEEKS.  INCREASE ACTIVITY AS TOLERATED.    Where were you discharged to? HOME.   Items Reviewed:  Medications reviewed: YES, WIFE MANAGES MEDICATIONS. MED PACK DOSES. ADD TRESIBA FLEXTOUCH 90U AT BEDTIME, ASPIRIN 81MG DAILY AND OMEGA-3 ACID ETHYL 2G CAPSULE BID. TAKING ALL SCHEDULED MEDICATIONS WITHOUT ISSUES. STOP LEVEMIR [place].   Allergies reviewed: YES.   Dietary changes reviewed: YES, HEART HEALTHY, DIABETIC DIET.   Referrals reviewed: NEUROLOGY TO BE SCHEDULED. OCCUPATIONAL AND PHYSICAL THERAPY EVALUATION IN PROGRESS.    Functional Questionnaire:   Activities of Daily Living (ADLs):   He states they are independent in the following: BATHING, DRESSING, SELF FEED, TOILETING (SOME LOSS OF BLADDER CONTROL). States they require assistance with the following: MEAL PREP, AMBULATES WITH WALKER/SCOOTER/CANE.   Any transportation issues/concerns?: NONE. HE DOES NOT DRIVE. HE IS DRIVEN BY WIFE AND/OR SON.   Any patient concerns? NO, NOT AT THIS TIME.    Confirmed importance and date/time of follow-up visits scheduled YES, SCHEDULED 05/09/18 AT 8:30.  Provider Appointment booked with DR. MCLEAN-SCOCUZZA, PCP.  Confirmed with patient if condition begins to worsen call PCP or go to the ER.  Patient was given the office number and encouraged to call back with question or concerns.  : YES, NO FURTHER QUESTIONS OR CONCERNS AT THIS TIME.

## 2018-05-05 DIAGNOSIS — M48061 Spinal stenosis, lumbar region without neurogenic claudication: Secondary | ICD-10-CM | POA: Diagnosis not present

## 2018-05-05 DIAGNOSIS — I129 Hypertensive chronic kidney disease with stage 1 through stage 4 chronic kidney disease, or unspecified chronic kidney disease: Secondary | ICD-10-CM | POA: Diagnosis not present

## 2018-05-05 DIAGNOSIS — M7981 Nontraumatic hematoma of soft tissue: Secondary | ICD-10-CM | POA: Diagnosis not present

## 2018-05-05 DIAGNOSIS — Z471 Aftercare following joint replacement surgery: Secondary | ICD-10-CM | POA: Diagnosis not present

## 2018-05-05 DIAGNOSIS — E1122 Type 2 diabetes mellitus with diabetic chronic kidney disease: Secondary | ICD-10-CM | POA: Diagnosis not present

## 2018-05-05 DIAGNOSIS — E1140 Type 2 diabetes mellitus with diabetic neuropathy, unspecified: Secondary | ICD-10-CM | POA: Diagnosis not present

## 2018-05-08 DIAGNOSIS — Z96641 Presence of right artificial hip joint: Secondary | ICD-10-CM | POA: Diagnosis not present

## 2018-05-08 DIAGNOSIS — M1711 Unilateral primary osteoarthritis, right knee: Secondary | ICD-10-CM | POA: Diagnosis not present

## 2018-05-09 ENCOUNTER — Other Ambulatory Visit: Payer: Self-pay | Admitting: Internal Medicine

## 2018-05-09 ENCOUNTER — Institutional Professional Consult (permissible substitution): Payer: Medicare Other | Admitting: Pulmonary Disease

## 2018-05-09 ENCOUNTER — Inpatient Hospital Stay: Payer: Medicare Other | Admitting: Internal Medicine

## 2018-05-09 DIAGNOSIS — J309 Allergic rhinitis, unspecified: Secondary | ICD-10-CM

## 2018-05-09 MED ORDER — MONTELUKAST SODIUM 10 MG PO TABS: 10.0000 mg | ORAL_TABLET | Freq: Every day | ORAL | 3 refills | Status: DC

## 2018-05-09 NOTE — ED Provider Notes (Signed)
[facility] Emergency Department Provider Note   ____________________________________________   First MD Initiated Contact with Patient 04/30/18 2116     (approximate)  I have reviewed the triage vital signs and the nursing notes.   HISTORY  Chief Complaint Numbness    HPI Chase Cabrera is a 72 y.o. male who comes in complaining of left arm numbness and left facial numbness for several times.  He got better and then had another episode where his left thumb also would not work very well and he has had some droopiness in the left eye.  The numbness is improving but the droopiness is still going on.  He has a history of coronary artery disease previous A. fib diabetes Crohn's disease and early dementia.  As near as I can certainly he has not had these symptoms before.  Past Medical History:  Diagnosis Date  . Arthritis   . Atrial fibrillation ([place])   . CAD (coronary artery disease)    6 STENTS  . CHF (congestive heart failure) ([place])   . Chicken pox   . Colon polyp   . Corneal dystrophy    bilateral  . Crohn's disease ([place])   . Diabetes ([place]) 2002  . Dysrhythmia    A-FIB AND PAF  . GERD (gastroesophageal reflux disease)   . Gout   . History of kidney stones   . History of shingles   . Hyperlipemia   . Hypertension   . Kidney stones   . Myocardial infarction ([place])    x4, last one in 2010  . Peripheral neuropathy   . Peripheral neuropathy   . Peripheral neuropathy   . PUD (peptic ulcer disease)   . Rectus sheath hematoma    02/18/18 8.9 x 4.5 cm then 02/19/18 7.9 x 4.5 cm   . Renal artery stenosis ([place])   . Renal artery stenosis ([place])   . Salzmann's nodular dystrophy 2012  . Sleep apnea   . Spinal stenosis     Patient Active Problem List   Diagnosis Date Noted  . TIA (transient ischemic attack) 04/30/2018  . Abnormal thyroid function test 04/03/2018  . Leukocytosis 04/03/2018  . Hematoma of rectus sheath 02/21/2018  . Carotid artery  stenosis 02/16/2018  . Altered mental status 02/09/2018  . Sinusitis 02/09/2018  . Hypokalemia 01/20/2018  . OSA (obstructive sleep apnea) 01/12/2018  . Osteoarthritis of right hip 12/13/2017  . Intraabdominal mass 11/23/2017  . Colonic mass 11/18/2017  . Crohn's disease ([place]) 10/20/2017  . Pulmonary nodule 10/07/2017  . History of skin cancer 10/07/2017  . Rib pain on right side 03/07/2017  . Osteoarthritis 04/08/2016  . Spinal stenosis, lumbar 03/25/2016  . DM type 2 with diabetic peripheral neuropathy ([place]) 07/15/2014  . Gout 05/04/2013  . Long term current use of anticoagulant 07/29/2011  . Renal artery stenosis ([place]) 07/01/2011  . Atrial fibrillation ([place]) 06/30/2011  . CAD (coronary artery disease) 06/30/2011  . Hyperlipidemia 06/30/2011  . Essential hypertension 06/30/2011    Past Surgical History:  Procedure Laterality Date  . ABLATION  2012  . APPLICATION VERTERBRAL DEFECT PROSTHETIC  05/01/2013  . ARTHRODESIS ANTERIOR LUMBAR SPINE  05/01/2013  . BACK SURGERY     lumbar fusion  . CARDIAC CATHETERIZATION    . CARDIAC ELECTROPHYSIOLOGY STUDY AND ABLATION    . CARDIAC SURGERY    . CARDIOVERSION    . CHOLECYSTECTOMY    . COLONOSCOPY WITH PROPOFOL N/A 04/09/2016   Completed for chronic diarrhea.  Few  scattered diverticuli.  No mucosal lesions.  Surgeon: Lollie Sails, MD;  Location: [facility] ENDOSCOPY;  Service: Endoscopy;  Laterality: N/A;  . CORNEAL EYE SURGERY Bilateral 07/28/11    09/22/2011  . CORONARY ANGIOPLASTY    . CORONARY ANGIOPLASTY WITH STENT PLACEMENT  03/28/2015   Distal 80% to normal Stent, Dilation Balloon  . CORONARY ARTERY BYPASS GRAFT     4 VESSELS  . EYE SURGERY     bilateral cataract 2017 Dr. Megan Mans  . foot and ankle repair Right 2005  . FRACTURE SURGERY     ANKLE PLATE AND SCREWS  . GALLBLADER    . JOINT REPLACEMENT     left total hip 11/11/15  . JOINT REPLACEMENT     right total hip 12/2017 Dr. Rudene Christians   . KNEE ARTHROSCOPY Right   . LUMBAR  SPINE FUSION ONE LEVEL  05/03/2013  . RENAL ARTERY STENT Right   . ROTATOR CUFF REPAIR Right 2006  . TONSILLECTOMY    . TOTAL HIP ARTHROPLASTY Left 11/11/2015   Procedure: TOTAL HIP ARTHROPLASTY ANTERIOR APPROACH;  Surgeon: Hessie Knows, MD;  Location: [facility] ORS;  Service: Orthopedics;  Laterality: Left;  . TOTAL HIP ARTHROPLASTY Right 12/13/2017   Procedure: TOTAL HIP ARTHROPLASTY ANTERIOR APPROACH;  Surgeon: Hessie Knows, MD;  Location: [facility] ORS;  Service: Orthopedics;  Laterality: Right;  . TRANSCATH PLACEMENT INTRAVASCULAR STENT LEG  03/2015    Prior to Admission medications   Medication Sig Start Date End Date Taking? Authorizing Provider  carvedilol (COREG) 25 MG tablet Take 1 tablet (25 mg total) by mouth 2 (two) times daily with a meal. 04/05/18  Yes McLean-Scocuzza, Nino Glow, MD  Cholecalciferol 50000 units TABS Take 1 tablet by mouth once a week. Patient taking differently: Take 50,000 Units by mouth every Saturday.  10/07/17  Yes McLean-Scocuzza, Nino Glow, MD  cholestyramine light (PREVALITE) 4 g packet Take 4 g by mouth daily with lunch.  01/31/18  Yes [provider]  clopidogrel (PLAVIX) 75 MG tablet Take 75 mg by mouth daily.   Yes [provider]  colchicine 0.6 MG tablet TAKE 1 TABLET BY MOUTH ONCE DAILY 07/12/17  Yes Leone Haven, MD  diclofenac sodium (VOLTAREN) 1 % GEL Apply 2 g topically 4 (four) times daily as needed (for pain).    Yes [provider]  dicyclomine (BENTYL) 10 MG capsule Take 10 mg by mouth 3 (three) times daily before meals.    Yes [provider]  gabapentin (NEURONTIN) 300 MG capsule take 2 capsules (670m) by mouth three times a day 01/16/18  Yes McLean-Scocuzza, TNino Glow MD  hydrALAZINE (APRESOLINE) 100 MG tablet TAKE 100 MG BY MOUTH THREE TIMES A DAY 12/29/17  Yes McLean-Scocuzza, TNino Glow MD  HYDROcodone-acetaminophen (NORCO) 5-325 MG tablet Take 1 tablet by mouth every 4 (four) hours as needed for severe pain (try not  to take more than 2 a day.). 02/20/18  Yes MNena Polio MD  insulin lispro (HUMALOG) 100 UNIT/ML KiwkPen Inject 14-32 Units into the skin 3 (three) times daily. (according to sliding scale - max 96u over 24 hours)   Yes [provider]  ipratropium (ATROVENT) 0.06 % nasal spray Place 2 sprays into both nostrils 4 (four) times daily. 02/09/18  Yes McLean-Scocuzza, TNino Glow MD  isosorbide mononitrate (IMDUR) 120 MG 24 hr tablet TAKE 1 TABLET BY MOUTH EVERY DAY 10/07/17  Yes McLean-Scocuzza, TNino Glow MD  levocetirizine (XYZAL) 5 MG tablet Take 1 tablet (5 mg total) by  mouth every evening. 02/09/18  Yes McLean-Scocuzza, Nino Glow, MD  metFORMIN (GLUCOPHAGE) 1000 MG tablet Take 1,000 mg by mouth 2 (two) times daily with a meal.   Yes [provider]  Multiple Vitamin (MULTI-VITAMINS) TABS Take 1 tablet by mouth daily. 04/15/09  Yes [provider]  nitroGLYCERIN (NITROSTAT) 0.4 MG SL tablet Place 1 tablet (0.4 mg total) under the tongue every 5 (five) minutes x 3 doses as needed. For chest pain.  Call 911 if no relief after 3 tablets 10/03/17 11/02/21 Yes McLean-Scocuzza, Nino Glow, MD  omeprazole (PRILOSEC) 40 MG capsule Take 40 mg by mouth 2 (two) times daily.    Yes [provider]  pravastatin (PRAVACHOL) 40 MG tablet Take 1 tablet (40 mg total) by mouth daily. 03/15/18  Yes McLean-Scocuzza, Nino Glow, MD  probenecid (BENEMID) 500 MG tablet Take 500 mg by mouth 2 (two) times daily.  01/09/15  Yes [provider]  TRESIBA FLEXTOUCH 200 UNIT/ML SOPN Inject 90 Units into the skin at bedtime. 04/25/18  Yes [provider]  vitamin C (ASCORBIC ACID) 500 MG tablet Take 500 mg by mouth daily.    Yes [provider]  Vitamin E 400 units TABS Take 400 Units by mouth 2 (two) times daily.   Yes [provider]  amLODipine (NORVASC) 5 MG tablet Take 1 tablet (5 mg total) by mouth 2 (two) times daily. If blood pressure >140/>90 make take another 1 pill of 5  mg 05/01/18   Mayo, Pete Pelt, MD  aspirin EC 81 MG EC tablet Take 1 tablet (81 mg total) by mouth daily. 05/02/18   Mayo, Pete Pelt, MD  montelukast (SINGULAIR) 10 MG tablet Take 1 tablet (10 mg total) by mouth at bedtime. 05/09/18   McLean-Scocuzza, Nino Glow, MD  omega-3 acid ethyl esters (LOVAZA) 1 g capsule Take 2 capsules (2 g total) by mouth 2 (two) times daily. 05/01/18   Mayo, Pete Pelt, MD    Allergies Allopurinol; Atenolol; Atorvastatin; Ramipril; Rosuvastatin; Simvastatin; Valsartan; and Cardizem [diltiazem]  Family History  Problem Relation Age of Onset  . CVA Father   . Stroke Father   . Lung cancer Mother   . Arthritis Mother   . Stomach cancer Sister   . Colon cancer Sister   . Diabetes Brother     Social History Social History   Tobacco Use  . Smoking status: Former Smoker    Packs/day: 1.00    Years: 3.00    Pack years: 3.00    Types: Cigarettes    Last attempt to quit: 06/30/1962    Years since quitting: 55.8  . Smokeless tobacco: Former Systems developer    Types: Chew    Quit date: 12/05/1968  Substance Use Topics  . Alcohol use: No  . Drug use: No    Review of Systems  Constitutional: No fever/chills Eyes: No visual changes. ENT: No sore throat. Cardiovascular: Denies chest pain. Respiratory: Denies shortness of breath. Gastrointestinal: No abdominal pain.  No nausea, no vomiting.  No diarrhea.  No constipation. Genitourinary: Negative for dysuria. Musculoskeletal: Negative for back pain. Skin: Negative for rash. Neurological: Negative for headaches Allergic/Immunilogical: **}  ____________________________________________   PHYSICAL EXAM:  VITAL SIGNS: ED Triage Vitals  Enc Vitals Group     BP 04/30/18 1614 119/83     Pulse Rate 04/30/18 1614 63     Resp 04/30/18 1614 18     Temp 04/30/18 1614 97.9 F (36.6 C)     Temp Source 04/30/18  1614 Oral     SpO2 04/30/18 1614 95 %     Weight 04/30/18 1616 187 lb (84.8 kg)     Height 04/30/18 1616 5' 5"   (1.651 m)     Head Circumference --      Peak Flow --      Pain Score 04/30/18 1616 0     Pain Loc --      Pain Edu? --      Excl. in [place]? --     Constitutional: Alert and oriented.  Somewhat worried Eyes: Conjunctivae are normal. PERRL. EOMI. some droopiness of the left eyelid Head: Atraumatic. Nose: No congestion/rhinnorhea. Mouth/Throat: Mucous membranes are moist.  Oropharynx non-erythematous. Neck: No stridor.   Cardiovascular: Normal rate, regular rhythm. Grossly normal heart sounds.  Good peripheral circulation. Respiratory: Normal respiratory effort.  No retractions. Lungs CTAB. Gastrointestinal: Soft and nontender. No distention. No abdominal bruits. No CVA tenderness. Musculoskeletal: No lower extremity tenderness nor edema.  No joint effusions. Neurologic:  Normal speech and language.  Patient has some droopiness of the left eyelid but there is no further visual changes per the patient cranial nerves are otherwise normal motor strength is normal cerebellar is normal and numbness is resolved. Skin:  Skin is warm, dry and intact. No rash noted. Psychiatric: Mood and affect are normal. Speech and behavior are normal.  ____________________________________________   LABS (all labs ordered are listed, but only abnormal results are displayed)  Labs Reviewed  CBC - Abnormal; Notable for the following components:      Result Value   RBC 3.94 (*)    HCT 37.7 (*)    RDW 16.1 (*)    All other components within normal limits  COMPREHENSIVE METABOLIC PANEL - Abnormal; Notable for the following components:   Glucose, Bld 192 (*)    All other components within normal limits  HEMOGLOBIN A1C - Abnormal; Notable for the following components:   Hgb A1c MFr Bld 7.1 (*)    All other components within normal limits  LIPID PANEL - Abnormal; Notable for the following components:   Triglycerides 609 (*)    HDL 26 (*)    All other components within normal limits  GLUCOSE, CAPILLARY -  Abnormal; Notable for the following components:   Glucose-Capillary 183 (*)    All other components within normal limits  CBC - Abnormal; Notable for the following components:   RBC 3.90 (*)    HCT 36.9 (*)    RDW 16.1 (*)    All other components within normal limits  BASIC METABOLIC PANEL - Abnormal; Notable for the following components:   Glucose, Bld 182 (*)    All other components within normal limits  GLUCOSE, CAPILLARY - Abnormal; Notable for the following components:   Glucose-Capillary 181 (*)    All other components within normal limits  GLUCOSE, CAPILLARY - Abnormal; Notable for the following components:   Glucose-Capillary 216 (*)    All other components within normal limits  GLUCOSE, CAPILLARY - Abnormal; Notable for the following components:   Glucose-Capillary 165 (*)    All other components within normal limits  GLUCOSE, CAPILLARY - Abnormal; Notable for the following components:   Glucose-Capillary 167 (*)    All other components within normal limits  GLUCOSE, CAPILLARY - Abnormal; Notable for the following components:   Glucose-Capillary 217 (*)    All other components within normal limits  GLUCOSE, CAPILLARY - Abnormal; Notable for the following components:   Glucose-Capillary 224 (*)  All other components within normal limits  PROTIME-INR  APTT  DIFFERENTIAL  TROPONIN I  GLUCOSE, CAPILLARY  CBG MONITORING, ED   ____________________________________________  EKG  KG read and interpreted by me shows normal sinus rhythm rate of 64 left axis nonspecific ST-T wave changes ____________________________________________  RADIOLOGY  ED MD interpretation CT the head read by radiology reviewed by me shows no acute intracranial process.  Official radiology report(s): No results found.  ____________________________________________   PROCEDURES  Procedure(s) p  Procedures  Critical Care performed:  ____________________________________________   INITIAL IMPRESSION / ASSESSMENT AND PLAN / ED COURSE  Patient with what sounds like to TIAs today no previous history of them.  We will get him in the hospital.  He also has high blood pressure.         ____________________________________________   FINAL CLINICAL IMPRESSION(S) / ED DIAGNOSES  Final diagnoses:  TIA (transient ischemic attack)     ED Discharge Orders         Ordered    aspirin EC 81 MG EC tablet  Daily     05/01/18 1547    amLODipine (NORVASC) 5 MG tablet  2 times daily    Note to Pharmacy:  Generic ok   05/01/18 1547    omega-3 acid ethyl esters (LOVAZA) 1 g capsule  2 times daily     05/01/18 1547    Increase activity slowly     05/01/18 1547    Diet - low sodium heart healthy     05/01/18 1547           Note:  This document was prepared using Dragon voice recognition software and may include unintentional dictation errors.    Nena Polio, MD 05/09/18 2022

## 2018-05-09 NOTE — Addendum Note (Signed)
Encounter addended by: McLean-ScocuzzaNino Glow, MD on: 05/09/2018 12:30 PM  Actions taken: Problem List modified, Problem List reviewed

## 2018-05-11 DIAGNOSIS — I129 Hypertensive chronic kidney disease with stage 1 through stage 4 chronic kidney disease, or unspecified chronic kidney disease: Secondary | ICD-10-CM | POA: Diagnosis not present

## 2018-05-11 DIAGNOSIS — E1122 Type 2 diabetes mellitus with diabetic chronic kidney disease: Secondary | ICD-10-CM | POA: Diagnosis not present

## 2018-05-11 DIAGNOSIS — M48061 Spinal stenosis, lumbar region without neurogenic claudication: Secondary | ICD-10-CM | POA: Diagnosis not present

## 2018-05-11 DIAGNOSIS — M7981 Nontraumatic hematoma of soft tissue: Secondary | ICD-10-CM | POA: Diagnosis not present

## 2018-05-11 DIAGNOSIS — Z471 Aftercare following joint replacement surgery: Secondary | ICD-10-CM | POA: Diagnosis not present

## 2018-05-11 DIAGNOSIS — E1140 Type 2 diabetes mellitus with diabetic neuropathy, unspecified: Secondary | ICD-10-CM | POA: Diagnosis not present

## 2018-05-12 ENCOUNTER — Telehealth: Payer: Self-pay | Admitting: Internal Medicine

## 2018-05-12 ENCOUNTER — Encounter: Payer: Self-pay | Admitting: Internal Medicine

## 2018-05-12 ENCOUNTER — Ambulatory Visit (INDEPENDENT_AMBULATORY_CARE_PROVIDER_SITE_OTHER): Payer: Medicare Other | Admitting: Internal Medicine

## 2018-05-12 VITALS — BP 128/58 | HR 64 | Temp 97.9°F | Ht 65.0 in | Wt 195.0 lb

## 2018-05-12 DIAGNOSIS — G459 Transient cerebral ischemic attack, unspecified: Secondary | ICD-10-CM | POA: Diagnosis not present

## 2018-05-12 DIAGNOSIS — E781 Pure hyperglyceridemia: Secondary | ICD-10-CM | POA: Diagnosis not present

## 2018-05-12 DIAGNOSIS — E785 Hyperlipidemia, unspecified: Secondary | ICD-10-CM

## 2018-05-12 DIAGNOSIS — E1142 Type 2 diabetes mellitus with diabetic polyneuropathy: Secondary | ICD-10-CM

## 2018-05-12 MED ORDER — OMEGA-3-ACID ETHYL ESTERS 1 G PO CAPS: 2.0000 g | ORAL_CAPSULE | Freq: Two times a day (BID) | ORAL | 3 refills | Status: AC

## 2018-05-12 NOTE — Progress Notes (Signed)
Pre visit review using our clinic review tool, if applicable. No additional management support is needed unless otherwise documented below in the visit note. 

## 2018-05-12 NOTE — Progress Notes (Signed)
Chief Complaint  Patient presents with  . Follow-up   HFU with wife  1. TIA, hypertriglyceridemia. Pt noted only left arm and face numbness with onset of sx's not left leg numbness.  04/30/18 MRI stable changes no infarct, CTA neck mild right CAS, CT head arterial foci of calcifications neurology appt upcoming and Dr. Doy Mince rec NCS/EMG. Pt and wife state his sx's resolved and he has had neuropathy in feet b/l  2. DM 2 A1C 7.1 04/30/18 on Tresiba 90 units f/u Endocrine     Review of Systems  Constitutional: Negative for weight loss.  HENT: Negative for hearing loss.   Eyes: Negative for blurred vision.  Respiratory: Negative for shortness of breath.   Cardiovascular: Negative for chest pain.  Musculoskeletal: Negative for falls.  Skin: Negative for rash.  Neurological: Negative for sensory change.  Psychiatric/Behavioral: Negative for depression.   Past Medical History:  Diagnosis Date  . Arthritis   . Atrial fibrillation ([place])   . CAD (coronary artery disease)    6 STENTS  . CHF (congestive heart failure) ([place])   . Chicken pox   . Colon polyp   . Corneal dystrophy    bilateral  . Crohn's disease ([place])   . Diabetes ([place]) 2002  . Dysrhythmia    A-FIB AND PAF  . GERD (gastroesophageal reflux disease)   . Gout   . History of kidney stones   . History of shingles   . Hyperlipemia   . Hypertension   . Kidney stones   . Myocardial infarction ([place])    x4, last one in 2010  . Peripheral neuropathy   . Peripheral neuropathy   . Peripheral neuropathy   . PUD (peptic ulcer disease)   . Rectus sheath hematoma    02/18/18 8.9 x 4.5 cm then 02/19/18 7.9 x 4.5 cm   . Renal artery stenosis ([place])   . Renal artery stenosis ([place])   . Salzmann's nodular dystrophy 2012  . Sleep apnea   . Spinal stenosis    Past Surgical History:  Procedure Laterality Date  . ABLATION  2012  . APPLICATION VERTERBRAL DEFECT PROSTHETIC  05/01/2013  . ARTHRODESIS ANTERIOR LUMBAR SPINE  05/01/2013  .  BACK SURGERY     lumbar fusion  . CARDIAC CATHETERIZATION    . CARDIAC ELECTROPHYSIOLOGY STUDY AND ABLATION    . CARDIAC SURGERY    . CARDIOVERSION    . CHOLECYSTECTOMY    . COLONOSCOPY WITH PROPOFOL N/A 04/09/2016   Completed for chronic diarrhea.  Few scattered diverticuli.  No mucosal lesions.  Surgeon: Lollie Sails, MD;  [facility] ENDOSCOPY;  Service: Endoscopy;  Laterality: N/A;  . CORNEAL EYE SURGERY Bilateral 07/28/11    09/22/2011  . CORONARY ANGIOPLASTY    . CORONARY ANGIOPLASTY WITH STENT PLACEMENT  03/28/2015   Distal 80% to normal Stent, Dilation Balloon  . CORONARY ARTERY BYPASS GRAFT     4 VESSELS  . EYE SURGERY     bilateral cataract 2017 Dr. Megan Mans  . foot and ankle repair Right 2005  . FRACTURE SURGERY     ANKLE PLATE AND SCREWS  . GALLBLADER    . JOINT REPLACEMENT     left total hip 11/11/15  . JOINT REPLACEMENT     right total hip 12/2017 Dr. Rudene Christians   . KNEE ARTHROSCOPY Right   . LUMBAR SPINE FUSION ONE LEVEL  05/03/2013  . RENAL ARTERY STENT Right   . ROTATOR CUFF REPAIR Right 2006  . TONSILLECTOMY    .  TOTAL HIP ARTHROPLASTY Left 11/11/2015   Procedure: TOTAL HIP ARTHROPLASTY ANTERIOR APPROACH;  Surgeon: Hessie Knows, MD;  Location: [facility] ORS;  Service: Orthopedics;  Laterality: Left;  . TOTAL HIP ARTHROPLASTY Right 12/13/2017   Procedure: TOTAL HIP ARTHROPLASTY ANTERIOR APPROACH;  Surgeon: Hessie Knows, MD;  Location: [facility] ORS;  Service: Orthopedics;  Laterality: Right;  . TRANSCATH PLACEMENT INTRAVASCULAR STENT LEG  03/2015   Family History  Problem Relation Age of Onset  . CVA Father   . Stroke Father   . Lung cancer Mother   . Arthritis Mother   . Stomach cancer Sister   . Colon cancer Sister   . Diabetes Brother    Social History   Socioeconomic History  . Marital status: Married    Spouse name: Not on file  . Number of children: Not on file  . Years of education: Not on file  . Highest education level: Not on file  Occupational  History  . Not on file  Social Needs  . Financial resource strain: Not very hard  . Food insecurity:    Worry: Never true    Inability: Never true  . Transportation needs:    Medical: No    Non-medical: No  Tobacco Use  . Smoking status: Former Smoker    Packs/day: 1.00    Years: 3.00    Pack years: 3.00    Types: Cigarettes    Last attempt to quit: 06/30/1962    Years since quitting: 55.9  . Smokeless tobacco: Former Systems developer    Types: [place] date: 12/05/1968  Substance and Sexual Activity  . Alcohol use: No  . Drug use: No  . Sexual activity: Not Currently  Lifestyle  . Physical activity:    Days per week: 2 days    Minutes per session: 30 min  . Stress: Not at all  Relationships  . Social connections:    Talks on phone: Patient refused    Gets together: Patient refused    Attends religious service: Patient refused    Active member of club or organization: Patient refused    Attends meetings of clubs or organizations: Patient refused    Relationship status: Patient refused  . Intimate partner violence:    Fear of current or ex partner: No    Emotionally abused: No    Physically abused: No    Forced sexual activity: No  Other Topics Concern  . Not on file  Social History Narrative   Married    Current Meds  Medication Sig  . amLODipine (NORVASC) 5 MG tablet Take 1 tablet (5 mg total) by mouth 2 (two) times daily. If blood pressure >140/>90 make take another 1 pill of 5 mg  . aspirin EC 81 MG EC tablet Take 1 tablet (81 mg total) by mouth daily.  . carvedilol (COREG) 25 MG tablet Take 1 tablet (25 mg total) by mouth 2 (two) times daily with a meal.  . Cholecalciferol 50000 units TABS Take 1 tablet by mouth once a week. (Patient taking differently: Take 50,000 Units by mouth every Saturday. )  . cholestyramine light (PREVALITE) 4 g packet Take 4 g by mouth daily with lunch.   . clopidogrel (PLAVIX) 75 MG tablet Take 75 mg by mouth daily.  . colchicine 0.6 MG  tablet TAKE 1 TABLET BY MOUTH ONCE DAILY  . diclofenac sodium (VOLTAREN) 1 % GEL Apply 2 g topically 4 (four) times daily as needed (for pain).   Marland Kitchen dicyclomine (  BENTYL) 10 MG capsule Take 10 mg by mouth 3 (three) times daily before meals.   . gabapentin (NEURONTIN) 300 MG capsule take 2 capsules (633m) by mouth three times a day  . hydrALAZINE (APRESOLINE) 100 MG tablet TAKE 100 MG BY MOUTH THREE TIMES A DAY  . HYDROcodone-acetaminophen (NORCO) 5-325 MG tablet Take 1 tablet by mouth every 4 (four) hours as needed for severe pain (try not to take more than 2 a day.).  .Marland Kitcheninsulin lispro (HUMALOG) 100 UNIT/ML KiwkPen Inject 14-32 Units into the skin 3 (three) times daily. (according to sliding scale - max 96u over 24 hours)  . ipratropium (ATROVENT) 0.06 % nasal spray Place 2 sprays into both nostrils 4 (four) times daily.  . isosorbide mononitrate (IMDUR) 120 MG 24 hr tablet TAKE 1 TABLET BY MOUTH EVERY DAY  . levocetirizine (XYZAL) 5 MG tablet Take 1 tablet (5 mg total) by mouth every evening.  . metFORMIN (GLUCOPHAGE) 1000 MG tablet Take 1,000 mg by mouth 2 (two) times daily with a meal.  . montelukast (SINGULAIR) 10 MG tablet Take 1 tablet (10 mg total) by mouth at bedtime.  . Multiple Vitamin (MULTI-VITAMINS) TABS Take 1 tablet by mouth daily.  . nitroGLYCERIN (NITROSTAT) 0.4 MG SL tablet Place 1 tablet (0.4 mg total) under the tongue every 5 (five) minutes x 3 doses as needed. For chest pain.  Call 911 if no relief after 3 tablets  . omega-3 acid ethyl esters (LOVAZA) 1 g capsule Take 2 capsules (2 g total) by mouth 2 (two) times daily.  .Marland Kitchenomeprazole (PRILOSEC) 40 MG capsule Take 40 mg by mouth 2 (two) times daily.   . pravastatin (PRAVACHOL) 40 MG tablet Take 1 tablet (40 mg total) by mouth daily.  . probenecid (BENEMID) 500 MG tablet Take 500 mg by mouth 2 (two) times daily.   . TRESIBA FLEXTOUCH 200 UNIT/ML SOPN Inject 90 Units into the skin at bedtime.  . vitamin C (ASCORBIC ACID) 500 MG  tablet Take 500 mg by mouth daily.   . Vitamin E 400 units TABS Take 400 Units by mouth 2 (two) times daily.   Allergies  Allergen Reactions  . Allopurinol Diarrhea, Other (See Comments) and Nausea And Vomiting    Other Reaction: GI Upset  . Atenolol Other (See Comments)    Other Reaction: bradycardia  . Atorvastatin Other (See Comments)    Other Reaction: muscle aches  . Ramipril Rash  . Rosuvastatin Other (See Comments)    Muscle aches  . Simvastatin Other (See Comments)    Other Reaction: MUSCLE ACHES (Zocor)  . Valsartan Other (See Comments)    Other Reaction: facial swelling  . Cardizem [Diltiazem] Rash   Recent Results (from the past 2160 hour(s))  Glucose, capillary     Status: Abnormal   Collection Time: 02/11/18  4:58 PM  Result Value Ref Range   Glucose-Capillary 227 (H) 65 - 99 mg/dL  Protime-INR     Status: Abnormal   Collection Time: 02/16/18 11:31 AM  Result Value Ref Range   Prothrombin Time 22.1 (H) 11.4 - 15.2 seconds   INR 1.95     Comment: Performed at [facility] [address] [place] [address] Lipase, blood     Status: None   Collection Time: 02/18/18  1:48 PM  Result Value Ref Range   Lipase 20 11 - 51 U/L    Comment: Performed at [facility] [address] [place] [id number] Comprehensive  metabolic panel     Status: Abnormal   Collection Time: 02/18/18  1:48 PM  Result Value Ref Range   Sodium 134 (L) 135 - 145 mmol/L   Potassium 4.7 3.5 - 5.1 mmol/L   Chloride 97 (L) 101 - 111 mmol/L   CO2 23 22 - 32 mmol/L   Glucose, Bld 307 (H) 65 - 99 mg/dL   BUN 14 6 - 20 mg/dL   Creatinine, Ser 0.95 0.61 - 1.24 mg/dL   Calcium 9.7 8.9 - 10.3 mg/dL   Total Protein 8.2 (H) 6.5 - 8.1 g/dL   Albumin 3.7 3.5 - 5.0 g/dL   AST 26 15 - 41 U/L   ALT 17 17 - 63 U/L   Alkaline Phosphatase 103 38 - 126 U/L   Total Bilirubin 0.5 0.3 - 1.2 mg/dL   GFR calc non Af Amer >60 >60 mL/min   GFR calc Af Amer >60 >60 mL/min     Comment: (NOTE) The eGFR has been calculated using the CKD EPI equation. This calculation has not been validated in all clinical situations. eGFR's persistently <60 mL/min signify possible Chronic Kidney Disease.    Anion gap 14 5 - 15    Comment: Performed at [facility], [address] [place], [place] [address]  CBC     Status: Abnormal   Collection Time: 02/18/18  1:48 PM  Result Value Ref Range   WBC 12.1 (H) 3.8 - 10.6 K/uL   RBC 3.79 (L) 4.40 - 5.90 MIL/uL   Hemoglobin 12.2 (L) 13.0 - 18.0 g/dL   HCT 36.1 (L) 40.0 - 52.0 %   MCV 95.2 80.0 - 100.0 fL   MCH 32.2 26.0 - 34.0 pg   MCHC 33.8 32.0 - 36.0 g/dL   RDW 15.1 (H) 11.5 - 14.5 %   Platelets 343 150 - 440 K/uL    Comment: Performed at [facility], [address] [place], [place] [address]  Urinalysis, Complete w Microscopic     Status: Abnormal   Collection Time: 02/18/18  1:48 PM  Result Value Ref Range   Color, Urine YELLOW (A) YELLOW   APPearance CLEAR (A) CLEAR   Specific Gravity, Urine 1.017 1.005 - 1.030   pH 5.0 5.0 - 8.0   Glucose, UA >=500 (A) NEGATIVE mg/dL   Hgb urine dipstick NEGATIVE NEGATIVE   Bilirubin Urine NEGATIVE NEGATIVE   Ketones, ur NEGATIVE NEGATIVE mg/dL   Protein, ur NEGATIVE NEGATIVE mg/dL   Nitrite NEGATIVE NEGATIVE   Leukocytes, UA NEGATIVE NEGATIVE   RBC / HPF 0-5 0 - 5 RBC/hpf   WBC, UA 0-5 0 - 5 WBC/hpf   Bacteria, UA NONE SEEN NONE SEEN   Squamous Epithelial / LPF NONE SEEN 0 - 5   Mucus PRESENT    Hyaline Casts, UA PRESENT    Ca Oxalate Crys, UA PRESENT     Comment: Performed at [facility], [address] [place], [place] [address]  Protime-INR     Status: Abnormal   Collection Time: 02/18/18  8:16 PM  Result Value Ref Range   Prothrombin Time 24.7 (H) 11.4 - 15.2 seconds   INR 2.25     Comment: Performed at [facility], [address] [place], [place] [address]  Comprehensive metabolic panel     Status: Abnormal   Collection  Time: 02/19/18  9:25 PM  Result Value Ref Range   Sodium 133 (L) 135 - 145 mmol/L   Potassium 4.1 3.5 - 5.1  mmol/L   Chloride 97 (L) 101 - 111 mmol/L   CO2 23 22 - 32 mmol/L   Glucose, Bld 174 (H) 65 - 99 mg/dL   BUN 19 6 - 20 mg/dL   Creatinine, Ser 0.83 0.61 - 1.24 mg/dL   Calcium 9.0 8.9 - 10.3 mg/dL   Total Protein 8.0 6.5 - 8.1 g/dL   Albumin 3.6 3.5 - 5.0 g/dL   AST 28 15 - 41 U/L   ALT 15 (L) 17 - 63 U/L   Alkaline Phosphatase 100 38 - 126 U/L   Total Bilirubin 0.5 0.3 - 1.2 mg/dL   GFR calc non Af Amer >60 >60 mL/min   GFR calc Af Amer >60 >60 mL/min    Comment: (NOTE) The eGFR has been calculated using the CKD EPI equation. This calculation has not been validated in all clinical situations. eGFR's persistently <60 mL/min signify possible Chronic Kidney Disease.    Anion gap 13 5 - 15    Comment: Performed at [facility], [address] [place], [place] [address]  CBC     Status: Abnormal   Collection Time: 02/19/18  9:25 PM  Result Value Ref Range   WBC 11.7 (H) 3.8 - 10.6 K/uL   RBC 3.75 (L) 4.40 - 5.90 MIL/uL   Hemoglobin 12.2 (L) 13.0 - 18.0 g/dL   HCT 34.9 (L) 40.0 - 52.0 %   MCV 93.3 80.0 - 100.0 fL   MCH 32.6 26.0 - 34.0 pg   MCHC 35.0 32.0 - 36.0 g/dL   RDW 14.9 (H) 11.5 - 14.5 %   Platelets 311 150 - 440 K/uL    Comment: Performed at [facility], [address] [place], [place] [address]  Protime-INR     Status: Abnormal   Collection Time: 02/19/18  9:25 PM  Result Value Ref Range   Prothrombin Time 24.3 (H) 11.4 - 15.2 seconds   INR 2.21     Comment: Performed at [facility], [address] [place], [place] [address]  Urinalysis, Complete w Microscopic     Status: Abnormal   Collection Time: 02/20/18 12:23 AM  Result Value Ref Range   Color, Urine YELLOW (A) YELLOW   APPearance CLEAR (A) CLEAR   Specific Gravity, Urine 1.028 1.005 - 1.030   pH 5.0 5.0 - 8.0   Glucose, UA NEGATIVE NEGATIVE mg/dL   Hgb urine  dipstick NEGATIVE NEGATIVE   Bilirubin Urine NEGATIVE NEGATIVE   Ketones, ur NEGATIVE NEGATIVE mg/dL   Protein, ur NEGATIVE NEGATIVE mg/dL   Nitrite NEGATIVE NEGATIVE   Leukocytes, UA NEGATIVE NEGATIVE   RBC / HPF 0-5 0 - 5 RBC/hpf   WBC, UA 0-5 0 - 5 WBC/hpf   Bacteria, UA NONE SEEN NONE SEEN   Squamous Epithelial / LPF NONE SEEN 0 - 5   Mucus PRESENT     Comment: Performed at [facility], [address] [place], [place] [address]  Glucose, capillary     Status: Abnormal   Collection Time: 02/20/18 12:38 AM  Result Value Ref Range   Glucose-Capillary 189 (H) 65 - 99 mg/dL  Protime-INR     Status: Abnormal   Collection Time: 03/16/18 12:00 AM  Result Value Ref Range   INR 0.8 (A) 0.9 - 1.1  Protime-INR     Status: None   Collection Time: 03/27/18 12:00 AM  Result Value Ref Range   INR 0.9 0.9 - 1.1  Lipid panel     Status: Abnormal  Collection Time: 04/19/18 11:14 AM  Result Value Ref Range   Cholesterol 156 0 - 200 mg/dL   Triglycerides 404 (H) <150 mg/dL   HDL 32 (L) >40 mg/dL   Total CHOL/HDL Ratio 4.9 RATIO   VLDL UNABLE TO CALCULATE IF TRIGLYCERIDE OVER 400 mg/dL 0 - 40 mg/dL   LDL Cholesterol UNABLE TO CALCULATE IF TRIGLYCERIDE OVER 400 mg/dL 0 - 99 mg/dL    Comment:        Total Cholesterol/HDL:CHD Risk Coronary Heart Disease Risk Table                     Men   Women  1/2 Average Risk   3.4   3.3  Average Risk       5.0   4.4  2 X Average Risk   9.6   7.1  3 X Average Risk  23.4   11.0        Use the calculated Patient Ratio above and the CHD Risk Table to determine the patient's CHD Risk.        ATP III CLASSIFICATION (LDL):  <100     mg/dL   Optimal  100-129  mg/dL   Near or Above                    Optimal  130-159  mg/dL   Borderline  160-189  mg/dL   High  >190     mg/dL   Very High Performed at [facility], [address] [place], [place] [address]   CBC with Differential/Platelet     Status: Abnormal   Collection Time:  04/19/18 11:14 AM  Result Value Ref Range   WBC 7.0 3.8 - 10.6 K/uL   RBC 4.04 (L) 4.40 - 5.90 MIL/uL   Hemoglobin 13.0 13.0 - 18.0 g/dL   HCT 37.8 (L) 40.0 - 52.0 %   MCV 93.7 80.0 - 100.0 fL   MCH 32.2 26.0 - 34.0 pg   MCHC 34.4 32.0 - 36.0 g/dL   RDW 16.0 (H) 11.5 - 14.5 %   Platelets 239 150 - 440 K/uL   Neutrophils Relative % 67 %   Neutro Abs 4.8 1.4 - 6.5 K/uL   Lymphocytes Relative 19 %   Lymphs Abs 1.3 1.0 - 3.6 K/uL   Monocytes Relative 9 %   Monocytes Absolute 0.6 0.2 - 1.0 K/uL   Eosinophils Relative 4 %   Eosinophils Absolute 0.3 0 - 0.7 K/uL   Basophils Relative 1 %   Basophils Absolute 0.0 0 - 0.1 K/uL    Comment: Performed at [facility], [address] [place], [place] [address]  T3, Free     Status: None   Collection Time: 04/19/18 11:14 AM  Result Value Ref Range   T3, Free 2.6 2.0 - 4.4 pg/mL    Comment: (NOTE) Performed At: [facility] [place], [place] [id number] Rush Farmer MD [id number]   Thyroid peroxidase antibody     Status: None   Collection Time: 04/19/18 11:14 AM  Result Value Ref Range   Thyroperoxidase Ab SerPl-aCnc 14 0 - 34 IU/mL    Comment: (NOTE) Performed At: [facility] [place], [place] [id number] Rush Farmer MD [id number]   T4, free     Status: Abnormal   Collection Time: 04/19/18 11:14 AM  Result Value Ref Range   Free T4 0.71 (L) 0.82 - 1.77 ng/dL    Comment: (NOTE) Biotin  ingestion may interfere with free T4 tests. If the results are inconsistent with the TSH level, previous test results, or the clinical presentation, then consider biotin interference. If needed, order repeat testing after stopping biotin. Performed at [facility], [address] [place], [place] [address]   TSH     Status: None   Collection Time: 04/19/18 11:14 AM  Result Value Ref Range   TSH 3.034 0.350 - 4.500 uIU/mL    Comment: Performed by a 3rd Generation assay  with a functional sensitivity of <=0.01 uIU/mL. Performed at [facility], [address] [place], [place] [address]   Protime-INR     Status: None   Collection Time: 04/30/18  4:18 PM  Result Value Ref Range   Prothrombin Time 13.5 11.4 - 15.2 seconds   INR 1.04     Comment: Performed at [facility], [address] [place], [place] [address]  APTT     Status: None   Collection Time: 04/30/18  4:18 PM  Result Value Ref Range   aPTT 30 24 - 36 seconds    Comment: Performed at [facility], [address] [place], [place] [address]  CBC     Status: Abnormal   Collection Time: 04/30/18  4:18 PM  Result Value Ref Range   WBC 8.1 3.8 - 10.6 K/uL   RBC 3.94 (L) 4.40 - 5.90 MIL/uL   Hemoglobin 13.1 13.0 - 18.0 g/dL   HCT 37.7 (L) 40.0 - 52.0 %   MCV 95.7 80.0 - 100.0 fL   MCH 33.2 26.0 - 34.0 pg   MCHC 34.7 32.0 - 36.0 g/dL   RDW 16.1 (H) 11.5 - 14.5 %   Platelets 249 150 - 440 K/uL    Comment: Performed at [facility], [address] [place], [place] [address]  Differential     Status: None   Collection Time: 04/30/18  4:18 PM  Result Value Ref Range   Neutrophils Relative % 66 %   Neutro Abs 5.4 1.4 - 6.5 K/uL   Lymphocytes Relative 20 %   Lymphs Abs 1.6 1.0 - 3.6 K/uL   Monocytes Relative 10 %   Monocytes Absolute 0.8 0.2 - 1.0 K/uL   Eosinophils Relative 3 %   Eosinophils Absolute 0.3 0 - 0.7 K/uL   Basophils Relative 1 %   Basophils Absolute 0.0 0 - 0.1 K/uL    Comment: Performed at [facility], [address] [place], [place] [address]  Comprehensive metabolic panel     Status: Abnormal   Collection Time: 04/30/18  4:18 PM  Result Value Ref Range   Sodium 137 135 - 145 mmol/L   Potassium 4.0 3.5 - 5.1 mmol/L   Chloride 103 98 - 111 mmol/L   CO2 22 22 - 32 mmol/L   Glucose, Bld 192 (H) 70 - 99 mg/dL   BUN 23 8 - 23 mg/dL   Creatinine, Ser 0.96 0.61 - 1.24 mg/dL   Calcium 9.4 8.9 - 10.3 mg/dL   Total Protein  8.0 6.5 - 8.1 g/dL   Albumin 4.1 3.5 - 5.0 g/dL   AST 41 15 - 41 U/L   ALT 32 0 - 44 U/L   Alkaline Phosphatase 92 38 - 126 U/L   Total Bilirubin 0.7 0.3 - 1.2 mg/dL   GFR calc non Af Amer >60 >60 mL/min   GFR calc Af Amer >60 >60 mL/min    Comment: (NOTE) The eGFR has been calculated using  the CKD EPI equation. This calculation has not been validated in all clinical situations. eGFR's persistently <60 mL/min signify possible Chronic Kidney Disease.    Anion gap 12 5 - 15    Comment: Performed at [facility], [address] [place], [place] [address]  Troponin I     Status: None   Collection Time: 04/30/18  4:18 PM  Result Value Ref Range   Troponin I <0.03 <0.03 ng/mL    Comment: Performed at [facility], [address] [place], [place] [address]  Hemoglobin A1c     Status: Abnormal   Collection Time: 04/30/18  4:18 PM  Result Value Ref Range   Hgb A1c MFr Bld 7.1 (H) 4.8 - 5.6 %    Comment: (NOTE) Pre diabetes:          5.7%-6.4% Diabetes:              >6.4% Glycemic control for   <7.0% adults with diabetes    Mean Plasma Glucose 157.07 mg/dL    Comment: Performed at [facility] [address], [place], [place] [address]  Glucose, capillary     Status: None   Collection Time: 04/30/18  7:34 PM  Result Value Ref Range   Glucose-Capillary 99 70 - 99 mg/dL  Glucose, capillary     Status: Abnormal   Collection Time: 04/30/18 10:46 PM  Result Value Ref Range   Glucose-Capillary 183 (H) 70 - 99 mg/dL  Lipid panel     Status: Abnormal   Collection Time: 05/01/18  4:10 AM  Result Value Ref Range   Cholesterol 144 0 - 200 mg/dL   Triglycerides 609 (H) <150 mg/dL   HDL 26 (L) >40 mg/dL   Total CHOL/HDL Ratio 5.5 RATIO   VLDL UNABLE TO CALCULATE IF TRIGLYCERIDE OVER 400 mg/dL 0 - 40 mg/dL   LDL Cholesterol UNABLE TO CALCULATE IF TRIGLYCERIDE OVER 400 mg/dL 0 - 99 mg/dL    Comment:        Total Cholesterol/HDL:CHD Risk Coronary Heart Disease  Risk Table                     Men   Women  1/2 Average Risk   3.4   3.3  Average Risk       5.0   4.4  2 X Average Risk   9.6   7.1  3 X Average Risk  23.4   11.0        Use the calculated Patient Ratio above and the CHD Risk Table to determine the patient's CHD Risk.        ATP III CLASSIFICATION (LDL):  <100     mg/dL   Optimal  100-129  mg/dL   Near or Above                    Optimal  130-159  mg/dL   Borderline  160-189  mg/dL   High  >190     mg/dL   Very High Performed at [facility], [address] [place], [place] [address]   Glucose, capillary     Status: Abnormal   Collection Time: 05/01/18  8:21 AM  Result Value Ref Range   Glucose-Capillary 181 (H) 70 - 99 mg/dL  CBC     Status: Abnormal   Collection Time: 05/01/18  8:28 AM  Result Value Ref Range   WBC 6.7 3.8 - 10.6 K/uL   RBC 3.90 (L)  4.40 - 5.90 MIL/uL   Hemoglobin 13.0 13.0 - 18.0 g/dL   HCT 36.9 (L) 40.0 - 52.0 %   MCV 94.7 80.0 - 100.0 fL   MCH 33.3 26.0 - 34.0 pg   MCHC 35.2 32.0 - 36.0 g/dL   RDW 16.1 (H) 11.5 - 14.5 %   Platelets 217 150 - 440 K/uL    Comment: Performed at [facility], [address] [place], [place] [address]  Basic metabolic panel     Status: Abnormal   Collection Time: 05/01/18  8:28 AM  Result Value Ref Range   Sodium 137 135 - 145 mmol/L   Potassium 3.9 3.5 - 5.1 mmol/L   Chloride 101 98 - 111 mmol/L   CO2 23 22 - 32 mmol/L   Glucose, Bld 182 (H) 70 - 99 mg/dL   BUN 18 8 - 23 mg/dL   Creatinine, Ser 0.82 0.61 - 1.24 mg/dL   Calcium 8.9 8.9 - 10.3 mg/dL   GFR calc non Af Amer >60 >60 mL/min   GFR calc Af Amer >60 >60 mL/min    Comment: (NOTE) The eGFR has been calculated using the CKD EPI equation. This calculation has not been validated in all clinical situations. eGFR's persistently <60 mL/min signify possible Chronic Kidney Disease.    Anion gap 13 5 - 15    Comment: Performed at [facility], [address] [place], [place]  [address]  Glucose, capillary     Status: Abnormal   Collection Time: 05/01/18 12:29 PM  Result Value Ref Range   Glucose-Capillary 216 (H) 70 - 99 mg/dL  ECHOCARDIOGRAM COMPLETE     Status: None   Collection Time: 05/01/18 12:29 PM  Result Value Ref Range   Weight 3,094.4 oz   Height 65 in   BP 151/54 mmHg  Glucose, capillary     Status: Abnormal   Collection Time: 05/01/18  5:11 PM  Result Value Ref Range   Glucose-Capillary 165 (H) 70 - 99 mg/dL  Glucose, capillary     Status: Abnormal   Collection Time: 05/01/18  8:23 PM  Result Value Ref Range   Glucose-Capillary 167 (H) 70 - 99 mg/dL  Glucose, capillary     Status: Abnormal   Collection Time: 05/02/18  7:55 AM  Result Value Ref Range   Glucose-Capillary 217 (H) 70 - 99 mg/dL  Glucose, capillary     Status: Abnormal   Collection Time: 05/02/18 12:03 PM  Result Value Ref Range   Glucose-Capillary 224 (H) 70 - 99 mg/dL   Objective  Body mass index is 32.45 kg/m. Wt Readings from Last 3 Encounters:  05/12/18 195 lb (88.5 kg)  04/30/18 193 lb 6.4 oz (87.7 kg)  04/14/18 191 lb 1.9 oz (86.7 kg)   Temp Readings from Last 3 Encounters:  05/12/18 97.9 F (36.6 C) (Oral)  05/02/18 97.9 F (36.6 C) (Oral)  04/14/18 98 F (36.7 C) (Oral)   BP Readings from Last 3 Encounters:  05/12/18 (!) 128/58  05/02/18 (!) 145/57  04/14/18 136/68   Pulse Readings from Last 3 Encounters:  05/12/18 64  05/02/18 (!) 52  04/14/18 63    Physical Exam  Constitutional: He is oriented to person, place, and time. Vital signs are normal. He appears well-developed and well-nourished. He is cooperative.  HENT:  Head: Normocephalic and atraumatic.  Mouth/Throat: Oropharynx is clear and moist and mucous membranes are normal.  Eyes: Pupils are equal, round, and reactive to light. Conjunctivae are normal.  Cardiovascular: Normal rate, regular rhythm and normal heart sounds.  Pulmonary/Chest: Effort normal and breath sounds normal.   Neurological: He is alert and oriented to person, place, and time. Gait normal.  Walking with walker   Skin: Skin is warm, dry and intact.  Psychiatric: He has a normal mood and affect. His speech is normal and behavior is normal. Judgment and thought content normal. Cognition and memory are normal.  Nursing note and vitals reviewed.   Assessment   1. TIA 2. Hypertriglyceridemia TG 609 04/30/18 h/o CAD, CAD right mild noted imaging 04/30/18   3. DM 2 A1C 7.1 04/30/18 4. HM Plan   1. F/u neuro Dr. Manuella Ghazi  Neurology in hospital rec consider NCS/EMG CC Dr. Manuella Ghazi 2. On lovaza 2 mg bid  If this does not help consider add zetia or vascepa in future on pravastatin 40 mg qhs unable to tolerate other statins in past 3. F/u endocrine Dr. Gabriel Carina  On Tyler Aas 90 units qhs new change in med  4.  Had flushotwill need upcoming pna 23 had 02/2014 UTD prevnar Disc Tdap and shingrix in future   PSAhad nl 10/03/17 0.5 consider DRE in future Colonoscopy had 04/2017 [facility] GIh/o crohns on mesalamine 2.4 mg qd-negative colonoscopy with multiple bxs Consider repeat DEXA reviewed DEXA +osteoporosis noted 06/07/04 CT chest 01/23/18 + lung nodules  With h/o pulm nodules on CXR and CT chest10/11/16and former smoker [place] dermatologist [id number] c/w left temple and right hand Aks.  -H/o skin cancer    Provider: Dr. Olivia Mackie McLean-Scocuzza-[facility]

## 2018-05-12 NOTE — Telephone Encounter (Signed)
Copied from [place] [id number]. Topic: Quick Communication - See Telephone Encounter >> May 12, 2018  9:17 AM Gardiner Ramus wrote: CRM for notification. See Telephone encounter for: 05/12/18. Cesar from [facility] called for verbal orders Physical therapy 1x1 2x4

## 2018-05-12 NOTE — Patient Instructions (Addendum)
Results for Chase Cabrera, Chase Cabrera (MRN [id number]) as of 05/12/2018 15:36  Ref. Range 05/01/2018 04:10  Cholesterol Latest Ref Range: 0 - 200 mg/dL 144  HDL Cholesterol Latest Ref Range: >40 mg/dL 26 (L)  LDL (calc) Latest Ref Range: 0 - 99 mg/dL UNABLE TO CALCULATE IF TRIGLYCERIDE OVER 400 mg/dL  Triglycerides Latest Ref Range: <150 mg/dL 609 (H)  VLDL Latest Ref Range: 0 - 40 mg/dL UNABLE TO CALCULATE IF TRIGLYCERIDE OVER 400 mg/dL  Results for Chase Cabrera, Chase Cabrera (MRN [id number]) as of 05/12/2018 15:36  Ref. Range 05/01/2018 04:10  Cholesterol Latest Ref Range: 0 - 200 mg/dL 144  HDL Cholesterol Latest Ref Range: >40 mg/dL 26 (L)  LDL (calc) Latest Ref Range: 0 - 99 mg/dL UNABLE TO CALCULATE IF TRIGLYCERIDE OVER 400 mg/dL  Triglycerides Latest Ref Range: <150 mg/dL 609 (H)  VLDL Latest Ref Range: 0 - 40 mg/dL UNABLE TO CALCULATE IF TRIGLYCERIDE OVER 400 mg/dL    Results for Chase Cabrera, Chase Cabrera (MRN [id number]) as of 05/12/2018 15:36  Ref. Range 04/19/2018 11:14  Total CHOL/HDL Ratio Latest Units: RATIO 4.9  Cholesterol Latest Ref Range: 0 - 200 mg/dL 156  HDL Cholesterol Latest Ref Range: >40 mg/dL 32 (L)  LDL (calc) Latest Ref Range: 0 - 99 mg/dL UNABLE TO CALCULATE IF TRIGLYCERIDE OVER 400 mg/dL  Triglycerides Latest Ref Range: <150 mg/dL 404 (H)  VLDL Latest Ref Range: 0 - 40 mg/dL UNABLE TO CALCULATE IF TRIGLYCERIDE OVER 400 mg/dL   10/22/16 cholesterol  Total 128, triglycerides 345, HDL 28, LDL 31    Cholesterol Cholesterol is a white, waxy, fat-like substance that is needed by the human body in small amounts. The liver makes all the cholesterol we need. Cholesterol is carried from the liver by the blood through the blood vessels. Deposits of cholesterol (plaques) may build up on blood vessel (artery) walls. Plaques make the arteries narrower and stiffer. Cholesterol plaques increase the risk for heart attack and stroke. You cannot feel your cholesterol level even if it is very high. The only way  to know that it is high is to have a blood test. Once you know your cholesterol levels, you should keep a record of the test results. Work with your health care provider to keep your levels in the desired range. What do the results mean?  Total cholesterol is a rough measure of all the cholesterol in your blood.  LDL (low-density lipoprotein) is the "bad" cholesterol. This is the type that causes plaque to build up on the artery walls. You want this level to be low.  HDL (high-density lipoprotein) is the "good" cholesterol because it cleans the arteries and carries the LDL away. You want this level to be high.  Triglycerides are fat that the body can either burn for energy or store. High levels are closely linked to heart disease. What are the desired levels of cholesterol?  Total cholesterol below 200.  LDL below 100 for people who are at risk, below 70 for people at very high risk.  HDL above 40 is good. A level of 60 or higher is considered to be protective against heart disease.  Triglycerides below 150. How can I lower my cholesterol? Diet Follow your diet program as told by your health care provider.  Choose fish or white meat chicken and [place], roasted or baked. Limit fatty cuts of red meat, fried foods, and processed meats, such as sausage and lunch meats.  Eat lots of fresh fruits and vegetables.  Choose whole  grains, beans, pasta, potatoes, and cereals.  Choose olive oil, corn oil, or canola oil, and use only small amounts.  Avoid butter, mayonnaise, shortening, or palm kernel oils.  Avoid foods with trans fats.  Drink skim or nonfat milk and eat low-fat or nonfat yogurt and cheeses. Avoid whole milk, cream, ice cream, egg yolks, and full-fat cheeses.  Healthier desserts include angel food cake, ginger snaps, animal crackers, hard candy, popsicles, and low-fat or nonfat frozen yogurt. Avoid pastries, cakes, pies, and cookies.  Exercise  Follow your exercise program  as told by your health care provider. A regular program: ? Helps to decrease LDL and raise HDL. ? Helps with weight control.  Do things that increase your activity level, such as gardening, walking, and taking the stairs.  Ask your health care provider about ways that you can be more active in your daily life.  Medicine  Take over-the-counter and prescription medicines only as told by your health care provider. ? Medicine may be prescribed by your health care provider to help lower cholesterol and decrease the risk for heart disease. This is usually done if diet and exercise have failed to bring down cholesterol levels. ? If you have several risk factors, you may need medicine even if your levels are normal.  This information is not intended to replace advice given to you by your health care provider. Make sure you discuss any questions you have with your health care provider. Document Released: 05/25/2001 Document Revised: 03/27/2016 Document Reviewed: 02/28/2016 Elsevier Interactive Patient Education  2018 [place].  Transient Ischemic Attack A transient ischemic attack (TIA) is a "warning stroke" that causes stroke-like symptoms. A TIA does not cause lasting damage to the brain. The symptoms of a TIA can happen fast and do not last long. It is important to know the symptoms of a TIA and what to do. This can help prevent stroke or death. Follow these instructions at home:  Take medicines only as told by your doctor. Make sure you understand all of the instructions.  You may need to take aspirin or warfarin medicine. Warfarin needs to be taken exactly as told. ? Taking too much or too little warfarin is dangerous. Blood tests must be done as often as told by your doctor. A PT blood test measures how long it takes for blood to clot. Your PT is used to calculate another value called an INR. Your PT and INR help your doctor adjust your warfarin dosage. He or she will make sure you are  taking the right amount. ? Food can cause problems with warfarin and affect the results of your blood tests. This is true for foods high in vitamin K. Eat the same amount of foods high in vitamin K each day. Foods high in vitamin K include spinach, kale, broccoli, cabbage, collard and turnip greens, Brussels sprouts, peas, cauliflower, seaweed, and parsley. Other foods high in vitamin K include beef and pork liver, green tea, and soybean oil. Eat the same amount of foods high in vitamin K each day. Avoid big changes in your diet. Tell your doctor before changing your diet. Talk to a food specialist (dietitian) if you have questions. ? Many medicines can cause problems with warfarin and affect your PT and INR. Tell your doctor about all medicines you take. This includes vitamins and dietary pills (supplements). Do not take or stop taking any prescribed or over-the-counter medicines unless your doctor tells you to. ? Warfarin can cause more bruising or bleeding.  Hold pressure over any cuts for longer than normal. Talk to your doctor about other side effects of warfarin. ? Avoid sports or activities that may cause injury or bleeding. ? Be careful when you shave, floss, or use sharp objects. ? Avoid or drink very little alcohol while taking warfarin. Tell your doctor if you change how much alcohol you drink. ? Tell your dentist and other doctors that you take warfarin before any procedures.  Follow your diet program as told, if you are given one.  Keep a healthy weight.  Stay active. Try to get at least 30 minutes of activity on all or most days.  Do not use any tobacco products, including cigarettes, chewing tobacco, or electronic cigarettes. If you need help quitting, ask your doctor.  Limit alcohol intake to no more than 1 drink per day for nonpregnant women and 2 drinks per day for men. One drink equals 12 ounces of beer, 5 ounces of wine, or 1 ounces of hard liquor.  Do not abuse drugs.  Keep  your home safe so you do not fall. You can do this by: ? Putting grab bars in the bedroom and bathroom. ? Raising toilet seats. ? Putting a seat in the shower.  Keep all follow-up visits as told by your doctor. This is important. Contact a doctor if:  Your personality changes.  You have trouble swallowing.  You have double vision.  You are dizzy.  You have a fever. Get help right away if: These symptoms may be an emergency. Do not wait to see if the symptoms will go away. Get medical help right away. Call your local emergency services (911 in the [place] Do not drive yourself to the hospital.  You have sudden weakness or lose feeling (go numb), especially on one side of the body. This can affect your: ? Face. ? Arm. ? Leg.  You have sudden trouble walking.  You have sudden trouble moving your arms or legs.  You have sudden confusion.  You have trouble talking.  You have trouble understanding.  You have sudden trouble seeing in one or both eyes.  You lose your balance.  Your movements are not smooth.  You have a sudden, very bad headache with no known cause.  You have new chest pain.  Your heartbeat is unsteady.  You are partly or totally unaware of what is going on around you.  This information is not intended to replace advice given to you by your health care provider. Make sure you discuss any questions you have with your health care provider. Document Released: 06/08/2008 Document Revised: 05/03/2016 Document Reviewed: 12/05/2013 Elsevier Interactive Patient Education  2018 [place] Choices to Lower Your Triglycerides Triglycerides are a type of fat in your blood. High levels of triglycerides can increase the risk of heart disease and stroke. If your triglyceride levels are high, the foods you eat and your eating habits are very important. Choosing the right foods can help lower your triglycerides. What general guidelines do I need to  follow?  Lose weight if you are overweight.  Limit or avoid alcohol.  Fill one half of your plate with vegetables and green salads.  Limit fruit to two servings a day. Choose fruit instead of juice.  Make one fourth of your plate whole grains. Look for the word "whole" as the first word in the ingredient list.  Fill one fourth of your plate with lean protein foods.  Enjoy fatty fish (such as salmon,  mackerel, sardines, and tuna) three times a week.  Choose healthy fats.  Limit foods high in starch and sugar.  Eat more home-cooked food and less restaurant, buffet, and fast food.  Limit fried foods.  Cook foods using methods other than frying.  Limit saturated fats.  Check ingredient lists to avoid foods with partially hydrogenated oils (trans fats) in them. What foods can I eat? Grains Whole grains, such as whole wheat or whole grain breads, crackers, cereals, and pasta. Unsweetened oatmeal, bulgur, barley, quinoa, or brown rice. Corn or whole wheat flour tortillas. Vegetables Fresh or frozen vegetables (raw, steamed, roasted, or grilled). Green salads. Fruits All fresh, canned (in natural juice), or frozen fruits. Meat and Other Protein Products Ground beef (85% or leaner), grass-fed beef, or beef trimmed of fat. Skinless chicken or [place]. Ground chicken or [place]. Pork trimmed of fat. All fish and seafood. Eggs. Dried beans, peas, or lentils. Unsalted nuts or seeds. Unsalted canned or dry beans. Dairy Low-fat dairy products, such as skim or 1% milk, 2% or reduced-fat cheeses, low-fat ricotta or cottage cheese, or plain low-fat yogurt. Fats and Oils Tub margarines without trans fats. Light or reduced-fat mayonnaise and salad dressings. Avocado. Safflower, olive, or canola oils. Natural peanut or almond butter. The items listed above may not be a complete list of recommended foods or beverages. Contact your dietitian for more options. What foods are not  recommended? Grains White bread. White pasta. White rice. Cornbread. Bagels, pastries, and croissants. Crackers that contain trans fat. Vegetables White potatoes. Corn. Creamed or fried vegetables. Vegetables in a cheese sauce. Fruits Dried fruits. Canned fruit in light or heavy syrup. Fruit juice. Meat and Other Protein Products Fatty cuts of meat. Ribs, chicken wings, bacon, sausage, bologna, salami, chitterlings, fatback, hot dogs, bratwurst, and packaged luncheon meats. Dairy Whole or 2% milk, cream, half-and-half, and cream cheese. Whole-fat or sweetened yogurt. Full-fat cheeses. Nondairy creamers and whipped toppings. Processed cheese, cheese spreads, or cheese curds. Sweets and Desserts Corn syrup, sugars, honey, and molasses. Candy. Jam and jelly. Syrup. Sweetened cereals. Cookies, pies, cakes, donuts, muffins, and ice cream. Fats and Oils Butter, stick margarine, lard, shortening, ghee, or bacon fat. Coconut, palm kernel, or palm oils. Beverages Alcohol. Sweetened drinks (such as sodas, lemonade, and fruit drinks or punches). The items listed above may not be a complete list of foods and beverages to avoid. Contact your dietitian for more information. This information is not intended to replace advice given to you by your health care provider. Make sure you discuss any questions you have with your health care provider. Document Released: 06/17/2004 Document Revised: 02/05/2016 Document Reviewed: 07/04/2013 Elsevier Interactive Patient Education  2017 [employer].

## 2018-05-17 NOTE — Telephone Encounter (Signed)
Ok PT

## 2018-05-17 NOTE — Telephone Encounter (Signed)
Ok to do so

## 2018-05-18 NOTE — Telephone Encounter (Signed)
[facility] has been giver verbal orders

## 2018-05-22 ENCOUNTER — Encounter: Payer: Self-pay | Admitting: Internal Medicine

## 2018-05-22 ENCOUNTER — Ambulatory Visit (INDEPENDENT_AMBULATORY_CARE_PROVIDER_SITE_OTHER): Payer: Medicare Other | Admitting: Internal Medicine

## 2018-05-22 VITALS — BP 140/62 | HR 64 | Temp 98.2°F | Ht 65.0 in | Wt 193.0 lb

## 2018-05-22 DIAGNOSIS — J069 Acute upper respiratory infection, unspecified: Secondary | ICD-10-CM

## 2018-05-22 DIAGNOSIS — J029 Acute pharyngitis, unspecified: Secondary | ICD-10-CM | POA: Diagnosis not present

## 2018-05-22 LAB — POC INFLUENZA A&B (BINAX/QUICKVUE)
INFLUENZA B, POC: NEGATIVE
Influenza A, POC: NEGATIVE

## 2018-05-22 MED ORDER — FLUTICASONE PROPIONATE 50 MCG/ACT NA SUSP: 2.0000 | Freq: Every day | NASAL | 11 refills | Status: DC

## 2018-05-22 MED ORDER — AMOXICILLIN-POT CLAVULANATE 875-125 MG PO TABS: 1.0000 | ORAL_TABLET | Freq: Two times a day (BID) | ORAL | 0 refills | Status: DC

## 2018-05-22 NOTE — Progress Notes (Signed)
Chief Complaint  Patient presents with  . URI  . Cough  . Sinusitis   Sick visit with wife  1. Saturday c/o sore throat, cough with light red, yellow clear phelgm, no fever no sinus pressure c/o left ear pain tried Sudafed, dayquil and nyquil w/o complete relief    Review of Systems  Constitutional: Negative for fever.  HENT: Positive for ear pain and sore throat. Negative for sinus pain.   Respiratory: Positive for cough and sputum production.   Cardiovascular: Negative for chest pain.  Skin: Negative for rash.   Past Medical History:  Diagnosis Date  . Arthritis   . Atrial fibrillation ([place])   . CAD (coronary artery disease)    6 STENTS  . CHF (congestive heart failure) ([place])   . Chicken pox   . Colon polyp   . Corneal dystrophy    bilateral  . Crohn's disease ([place])   . Diabetes ([place]) 2002  . Dysrhythmia    A-FIB AND PAF  . GERD (gastroesophageal reflux disease)   . Gout   . History of kidney stones   . History of shingles   . Hyperlipemia   . Hypertension   . Kidney stones   . Myocardial infarction ([place])    x4, last one in 2010  . Peripheral neuropathy   . Peripheral neuropathy   . Peripheral neuropathy   . PUD (peptic ulcer disease)   . Rectus sheath hematoma    02/18/18 8.9 x 4.5 cm then 02/19/18 7.9 x 4.5 cm   . Renal artery stenosis ([place])   . Renal artery stenosis ([place])   . Salzmann's nodular dystrophy 2012  . Sleep apnea   . Spinal stenosis    Past Surgical History:  Procedure Laterality Date  . ABLATION  2012  . APPLICATION VERTERBRAL DEFECT PROSTHETIC  05/01/2013  . ARTHRODESIS ANTERIOR LUMBAR SPINE  05/01/2013  . BACK SURGERY     lumbar fusion  . CARDIAC CATHETERIZATION    . CARDIAC ELECTROPHYSIOLOGY STUDY AND ABLATION    . CARDIAC SURGERY    . CARDIOVERSION    . CHOLECYSTECTOMY    . COLONOSCOPY WITH PROPOFOL N/A 04/09/2016   Completed for chronic diarrhea.  Few scattered diverticuli.  No mucosal lesions.  Surgeon: Lollie Sails, MD;   Location[facility] ENDOSCOPY;  Service: Endoscopy;  Laterality: N/A;  . CORNEAL EYE SURGERY Bilateral 07/28/11    09/22/2011  . CORONARY ANGIOPLASTY    . CORONARY ANGIOPLASTY WITH STENT PLACEMENT  03/28/2015   Distal 80% to normal Stent, Dilation Balloon  . CORONARY ARTERY BYPASS GRAFT     4 VESSELS  . EYE SURGERY     bilateral cataract 2017 Dr. Megan Mans  . foot and ankle repair Right 2005  . FRACTURE SURGERY     ANKLE PLATE AND SCREWS  . GALLBLADER    . JOINT REPLACEMENT     left total hip 11/11/15  . JOINT REPLACEMENT     right total hip 12/2017 Dr. Rudene Christians   . KNEE ARTHROSCOPY Right   . LUMBAR SPINE FUSION ONE LEVEL  05/03/2013  . RENAL ARTERY STENT Right   . ROTATOR CUFF REPAIR Right 2006  . TONSILLECTOMY    . TOTAL HIP ARTHROPLASTY Left 11/11/2015   Procedure: TOTAL HIP ARTHROPLASTY ANTERIOR APPROACH;  Surgeon: Hessie Knows, MD;  Location: [facility] ORS;  Service: Orthopedics;  Laterality: Left;  . TOTAL HIP ARTHROPLASTY Right 12/13/2017   Procedure: TOTAL HIP ARTHROPLASTY ANTERIOR APPROACH;  Surgeon: Hessie Knows, MD;  Location: [facility] ORS;  Service: Orthopedics;  Laterality: Right;  . TRANSCATH PLACEMENT INTRAVASCULAR STENT LEG  03/2015   Family History  Problem Relation Age of Onset  . CVA Father   . Stroke Father   . Lung cancer Mother   . Arthritis Mother   . Stomach cancer Sister   . Colon cancer Sister   . Diabetes Brother    Social History   Socioeconomic History  . Marital status: Married    Spouse name: Not on file  . Number of children: Not on file  . Years of education: Not on file  . Highest education level: Not on file  Occupational History  . Not on file  Social Needs  . Financial resource strain: Not very hard  . Food insecurity:    Worry: Never true    Inability: Never true  . Transportation needs:    Medical: No    Non-medical: No  Tobacco Use  . Smoking status: Former Smoker    Packs/day: 1.00    Years: 3.00    Pack years: 3.00    Types: Cigarettes     Last attempt to quit: 06/30/1962    Years since quitting: 55.9  . Smokeless tobacco: Former Systems developer    Types: [place] date: 12/05/1968  Substance and Sexual Activity  . Alcohol use: No  . Drug use: No  . Sexual activity: Not Currently  Lifestyle  . Physical activity:    Days per week: 2 days    Minutes per session: 30 min  . Stress: Not at all  Relationships  . Social connections:    Talks on phone: Patient refused    Gets together: Patient refused    Attends religious service: Patient refused    Active member of club or organization: Patient refused    Attends meetings of clubs or organizations: Patient refused    Relationship status: Patient refused  . Intimate partner violence:    Fear of current or ex partner: No    Emotionally abused: No    Physically abused: No    Forced sexual activity: No  Other Topics Concern  . Not on file  Social History Narrative   Married    Current Meds  Medication Sig  . amLODipine (NORVASC) 5 MG tablet Take 1 tablet (5 mg total) by mouth 2 (two) times daily. If blood pressure >140/>90 make take another 1 pill of 5 mg  . aspirin EC 81 MG EC tablet Take 1 tablet (81 mg total) by mouth daily.  . carvedilol (COREG) 25 MG tablet Take 1 tablet (25 mg total) by mouth 2 (two) times daily with a meal.  . Cholecalciferol 50000 units TABS Take 1 tablet by mouth once a week. (Patient taking differently: Take 50,000 Units by mouth every Saturday. )  . cholestyramine light (PREVALITE) 4 g packet Take 4 g by mouth daily with lunch.   . clopidogrel (PLAVIX) 75 MG tablet Take 75 mg by mouth daily.  . colchicine 0.6 MG tablet TAKE 1 TABLET BY MOUTH ONCE DAILY  . diclofenac sodium (VOLTAREN) 1 % GEL Apply 2 g topically 4 (four) times daily as needed (for pain).   Marland Kitchen dicyclomine (BENTYL) 10 MG capsule Take 10 mg by mouth 3 (three) times daily before meals.   . gabapentin (NEURONTIN) 300 MG capsule take 2 capsules (671m) by mouth three times a day  .  hydrALAZINE (APRESOLINE) 100 MG tablet TAKE 100 MG BY MOUTH THREE TIMES A DAY  .  HYDROcodone-acetaminophen (NORCO) 5-325 MG tablet Take 1 tablet by mouth every 4 (four) hours as needed for severe pain (try not to take more than 2 a day.).  Marland Kitchen insulin lispro (HUMALOG) 100 UNIT/ML KiwkPen Inject 14-32 Units into the skin 3 (three) times daily. (according to sliding scale - max 96u over 24 hours)  . ipratropium (ATROVENT) 0.06 % nasal spray Place 2 sprays into both nostrils 4 (four) times daily.  . isosorbide mononitrate (IMDUR) 120 MG 24 hr tablet TAKE 1 TABLET BY MOUTH EVERY DAY  . levocetirizine (XYZAL) 5 MG tablet Take 1 tablet (5 mg total) by mouth every evening.  . metFORMIN (GLUCOPHAGE) 1000 MG tablet Take 1,000 mg by mouth 2 (two) times daily with a meal.  . montelukast (SINGULAIR) 10 MG tablet Take 1 tablet (10 mg total) by mouth at bedtime.  . Multiple Vitamin (MULTI-VITAMINS) TABS Take 1 tablet by mouth daily.  . nitroGLYCERIN (NITROSTAT) 0.4 MG SL tablet Place 1 tablet (0.4 mg total) under the tongue every 5 (five) minutes x 3 doses as needed. For chest pain.  Call 911 if no relief after 3 tablets  . omega-3 acid ethyl esters (LOVAZA) 1 g capsule Take 2 capsules (2 g total) by mouth 2 (two) times daily.  Marland Kitchen omeprazole (PRILOSEC) 40 MG capsule Take 40 mg by mouth 2 (two) times daily.   . pravastatin (PRAVACHOL) 40 MG tablet Take 1 tablet (40 mg total) by mouth daily.  . probenecid (BENEMID) 500 MG tablet Take 500 mg by mouth 2 (two) times daily.   . TRESIBA FLEXTOUCH 200 UNIT/ML SOPN Inject 90 Units into the skin at bedtime.  . vitamin C (ASCORBIC ACID) 500 MG tablet Take 500 mg by mouth daily.   . Vitamin E 400 units TABS Take 400 Units by mouth 2 (two) times daily.   Allergies  Allergen Reactions  . Allopurinol Diarrhea, Other (See Comments) and Nausea And Vomiting    Other Reaction: GI Upset  . Atenolol Other (See Comments)    Other Reaction: bradycardia  . Atorvastatin Other (See  Comments)    Other Reaction: muscle aches  . Ramipril Rash  . Rosuvastatin Other (See Comments)    Muscle aches  . Simvastatin Other (See Comments)    Other Reaction: MUSCLE ACHES (Zocor)  . Valsartan Other (See Comments)    Other Reaction: facial swelling  . Cardizem [Diltiazem] Rash   Recent Results (from the past 2160 hour(s))  Protime-INR     Status: Abnormal   Collection Time: 03/16/18 12:00 AM  Result Value Ref Range   INR 0.8 (A) 0.9 - 1.1  Protime-INR     Status: None   Collection Time: 03/27/18 12:00 AM  Result Value Ref Range   INR 0.9 0.9 - 1.1  Lipid panel     Status: Abnormal   Collection Time: 04/19/18 11:14 AM  Result Value Ref Range   Cholesterol 156 0 - 200 mg/dL   Triglycerides 404 (H) <150 mg/dL   HDL 32 (L) >40 mg/dL   Total CHOL/HDL Ratio 4.9 RATIO   VLDL UNABLE TO CALCULATE IF TRIGLYCERIDE OVER 400 mg/dL 0 - 40 mg/dL   LDL Cholesterol UNABLE TO CALCULATE IF TRIGLYCERIDE OVER 400 mg/dL 0 - 99 mg/dL    Comment:        Total Cholesterol/HDL:CHD Risk Coronary Heart Disease Risk Table                     Men   Women  1/2  Average Risk   3.4   3.3  Average Risk       5.0   4.4  2 X Average Risk   9.6   7.1  3 X Average Risk  23.4   11.0        Use the calculated Patient Ratio above and the CHD Risk Table to determine the patient's CHD Risk.        ATP III CLASSIFICATION (LDL):  <100     mg/dL   Optimal  100-129  mg/dL   Near or Above                    Optimal  130-159  mg/dL   Borderline  160-189  mg/dL   High  >190     mg/dL   Very High Performed at [facility], [address] [place], [place] [address]   CBC with Differential/Platelet     Status: Abnormal   Collection Time: 04/19/18 11:14 AM  Result Value Ref Range   WBC 7.0 3.8 - 10.6 K/uL   RBC 4.04 (L) 4.40 - 5.90 MIL/uL   Hemoglobin 13.0 13.0 - 18.0 g/dL   HCT 37.8 (L) 40.0 - 52.0 %   MCV 93.7 80.0 - 100.0 fL   MCH 32.2 26.0 - 34.0 pg   MCHC 34.4 32.0 - 36.0 g/dL   RDW  16.0 (H) 11.5 - 14.5 %   Platelets 239 150 - 440 K/uL   Neutrophils Relative % 67 %   Neutro Abs 4.8 1.4 - 6.5 K/uL   Lymphocytes Relative 19 %   Lymphs Abs 1.3 1.0 - 3.6 K/uL   Monocytes Relative 9 %   Monocytes Absolute 0.6 0.2 - 1.0 K/uL   Eosinophils Relative 4 %   Eosinophils Absolute 0.3 0 - 0.7 K/uL   Basophils Relative 1 %   Basophils Absolute 0.0 0 - 0.1 K/uL    Comment: Performed at [facility], [address] [place], [place] [address]  T3, Free     Status: None   Collection Time: 04/19/18 11:14 AM  Result Value Ref Range   T3, Free 2.6 2.0 - 4.4 pg/mL    Comment: (NOTE) Performed At: [facility] [place], [place] [id number] Rush Farmer MD [id number]   Thyroid peroxidase antibody     Status: None   Collection Time: 04/19/18 11:14 AM  Result Value Ref Range   Thyroperoxidase Ab SerPl-aCnc 14 0 - 34 IU/mL    Comment: (NOTE) Performed At: [facility] [place], [place] [id number] Rush Farmer MD [id number]   T4, free     Status: Abnormal   Collection Time: 04/19/18 11:14 AM  Result Value Ref Range   Free T4 0.71 (L) 0.82 - 1.77 ng/dL    Comment: (NOTE) Biotin ingestion may interfere with free T4 tests. If the results are inconsistent with the TSH level, previous test results, or the clinical presentation, then consider biotin interference. If needed, order repeat testing after stopping biotin. Performed at [facility], [address] [place], [place] [address]   TSH     Status: None   Collection Time: 04/19/18 11:14 AM  Result Value Ref Range   TSH 3.034 0.350 - 4.500 uIU/mL    Comment: Performed by a 3rd Generation assay with a functional sensitivity of <=0.01 uIU/mL. Performed at [facility], [address], [place], [place] [address]   Protime-INR     Status: None  Collection Time: 04/30/18  4:18 PM  Result Value Ref Range   Prothrombin Time 13.5 11.4 - 15.2  seconds   INR 1.04     Comment: Performed at [facility], [address] [place], [place] [address]  APTT     Status: None   Collection Time: 04/30/18  4:18 PM  Result Value Ref Range   aPTT 30 24 - 36 seconds    Comment: Performed at [facility], [address] [place], [place] [address]  CBC     Status: Abnormal   Collection Time: 04/30/18  4:18 PM  Result Value Ref Range   WBC 8.1 3.8 - 10.6 K/uL   RBC 3.94 (L) 4.40 - 5.90 MIL/uL   Hemoglobin 13.1 13.0 - 18.0 g/dL   HCT 37.7 (L) 40.0 - 52.0 %   MCV 95.7 80.0 - 100.0 fL   MCH 33.2 26.0 - 34.0 pg   MCHC 34.7 32.0 - 36.0 g/dL   RDW 16.1 (H) 11.5 - 14.5 %   Platelets 249 150 - 440 K/uL    Comment: Performed at [facility], [address] [place], [place] [address]  Differential     Status: None   Collection Time: 04/30/18  4:18 PM  Result Value Ref Range   Neutrophils Relative % 66 %   Neutro Abs 5.4 1.4 - 6.5 K/uL   Lymphocytes Relative 20 %   Lymphs Abs 1.6 1.0 - 3.6 K/uL   Monocytes Relative 10 %   Monocytes Absolute 0.8 0.2 - 1.0 K/uL   Eosinophils Relative 3 %   Eosinophils Absolute 0.3 0 - 0.7 K/uL   Basophils Relative 1 %   Basophils Absolute 0.0 0 - 0.1 K/uL    Comment: Performed at [facility], [address] [place], [place] [address]  Comprehensive metabolic panel     Status: Abnormal   Collection Time: 04/30/18  4:18 PM  Result Value Ref Range   Sodium 137 135 - 145 mmol/L   Potassium 4.0 3.5 - 5.1 mmol/L   Chloride 103 98 - 111 mmol/L   CO2 22 22 - 32 mmol/L   Glucose, Bld 192 (H) 70 - 99 mg/dL   BUN 23 8 - 23 mg/dL   Creatinine, Ser 0.96 0.61 - 1.24 mg/dL   Calcium 9.4 8.9 - 10.3 mg/dL   Total Protein 8.0 6.5 - 8.1 g/dL   Albumin 4.1 3.5 - 5.0 g/dL   AST 41 15 - 41 U/L   ALT 32 0 - 44 U/L   Alkaline Phosphatase 92 38 - 126 U/L   Total Bilirubin 0.7 0.3 - 1.2 mg/dL   GFR calc non Af Amer >60 >60 mL/min   GFR calc Af Amer >60 >60 mL/min    Comment:  (NOTE) The eGFR has been calculated using the CKD EPI equation. This calculation has not been validated in all clinical situations. eGFR's persistently <60 mL/min signify possible Chronic Kidney Disease.    Anion gap 12 5 - 15    Comment: Performed at [facility], [address] [place], [place] [address]  Troponin I     Status: None   Collection Time: 04/30/18  4:18 PM  Result Value Ref Range   Troponin I <0.03 <0.03 ng/mL    Comment: Performed at [facility], [address] [place], [place] [address]  Hemoglobin A1c     Status: Abnormal   Collection Time: 04/30/18  4:18 PM  Result Value Ref Range  Hgb A1c MFr Bld 7.1 (H) 4.8 - 5.6 %    Comment: (NOTE) Pre diabetes:          5.7%-6.4% Diabetes:              >6.4% Glycemic control for   <7.0% adults with diabetes    Mean Plasma Glucose 157.07 mg/dL    Comment: Performed at [facility] [address], [place], [place] [address]  Glucose, capillary     Status: None   Collection Time: 04/30/18  7:34 PM  Result Value Ref Range   Glucose-Capillary 99 70 - 99 mg/dL  Glucose, capillary     Status: Abnormal   Collection Time: 04/30/18 10:46 PM  Result Value Ref Range   Glucose-Capillary 183 (H) 70 - 99 mg/dL  Lipid panel     Status: Abnormal   Collection Time: 05/01/18  4:10 AM  Result Value Ref Range   Cholesterol 144 0 - 200 mg/dL   Triglycerides 609 (H) <150 mg/dL   HDL 26 (L) >40 mg/dL   Total CHOL/HDL Ratio 5.5 RATIO   VLDL UNABLE TO CALCULATE IF TRIGLYCERIDE OVER 400 mg/dL 0 - 40 mg/dL   LDL Cholesterol UNABLE TO CALCULATE IF TRIGLYCERIDE OVER 400 mg/dL 0 - 99 mg/dL    Comment:        Total Cholesterol/HDL:CHD Risk Coronary Heart Disease Risk Table                     Men   Women  1/2 Average Risk   3.4   3.3  Average Risk       5.0   4.4  2 X Average Risk   9.6   7.1  3 X Average Risk  23.4   11.0        Use the calculated Patient Ratio above and the CHD Risk Table to determine the  patient's CHD Risk.        ATP III CLASSIFICATION (LDL):  <100     mg/dL   Optimal  100-129  mg/dL   Near or Above                    Optimal  130-159  mg/dL   Borderline  160-189  mg/dL   High  >190     mg/dL   Very High Performed at [facility], [address] [place], [place] [address]   Glucose, capillary     Status: Abnormal   Collection Time: 05/01/18  8:21 AM  Result Value Ref Range   Glucose-Capillary 181 (H) 70 - 99 mg/dL  CBC     Status: Abnormal   Collection Time: 05/01/18  8:28 AM  Result Value Ref Range   WBC 6.7 3.8 - 10.6 K/uL   RBC 3.90 (L) 4.40 - 5.90 MIL/uL   Hemoglobin 13.0 13.0 - 18.0 g/dL   HCT 36.9 (L) 40.0 - 52.0 %   MCV 94.7 80.0 - 100.0 fL   MCH 33.3 26.0 - 34.0 pg   MCHC 35.2 32.0 - 36.0 g/dL   RDW 16.1 (H) 11.5 - 14.5 %   Platelets 217 150 - 440 K/uL    Comment: Performed at [facility], [address] [place], [place] [address]  Basic metabolic panel     Status: Abnormal   Collection Time: 05/01/18  8:28 AM  Result Value Ref Range   Sodium 137 135 - 145 mmol/L   Potassium 3.9 3.5 - 5.1 mmol/L  Chloride 101 98 - 111 mmol/L   CO2 23 22 - 32 mmol/L   Glucose, Bld 182 (H) 70 - 99 mg/dL   BUN 18 8 - 23 mg/dL   Creatinine, Ser 0.82 0.61 - 1.24 mg/dL   Calcium 8.9 8.9 - 10.3 mg/dL   GFR calc non Af Amer >60 >60 mL/min   GFR calc Af Amer >60 >60 mL/min    Comment: (NOTE) The eGFR has been calculated using the CKD EPI equation. This calculation has not been validated in all clinical situations. eGFR's persistently <60 mL/min signify possible Chronic Kidney Disease.    Anion gap 13 5 - 15    Comment: Performed at [facility], [address] [place], [place] [address]  Glucose, capillary     Status: Abnormal   Collection Time: 05/01/18 12:29 PM  Result Value Ref Range   Glucose-Capillary 216 (H) 70 - 99 mg/dL  ECHOCARDIOGRAM COMPLETE     Status: None   Collection Time: 05/01/18 12:29 PM  Result Value Ref  Range   Weight 3,094.4 oz   Height 65 in   BP 151/54 mmHg  Glucose, capillary     Status: Abnormal   Collection Time: 05/01/18  5:11 PM  Result Value Ref Range   Glucose-Capillary 165 (H) 70 - 99 mg/dL  Glucose, capillary     Status: Abnormal   Collection Time: 05/01/18  8:23 PM  Result Value Ref Range   Glucose-Capillary 167 (H) 70 - 99 mg/dL  Glucose, capillary     Status: Abnormal   Collection Time: 05/02/18  7:55 AM  Result Value Ref Range   Glucose-Capillary 217 (H) 70 - 99 mg/dL  Glucose, capillary     Status: Abnormal   Collection Time: 05/02/18 12:03 PM  Result Value Ref Range   Glucose-Capillary 224 (H) 70 - 99 mg/dL   Objective  Body mass index is 32.12 kg/m. Wt Readings from Last 3 Encounters:  05/22/18 193 lb (87.5 kg)  05/12/18 195 lb (88.5 kg)  04/30/18 193 lb 6.4 oz (87.7 kg)   Temp Readings from Last 3 Encounters:  05/22/18 98.2 F (36.8 C) (Oral)  05/12/18 97.9 F (36.6 C) (Oral)  05/02/18 97.9 F (36.6 C) (Oral)   BP Readings from Last 3 Encounters:  05/22/18 140/62  05/12/18 (!) 128/58  05/02/18 (!) 145/57   Pulse Readings from Last 3 Encounters:  05/22/18 64  05/12/18 64  05/02/18 (!) 52    Physical Exam  Constitutional: He is oriented to person, place, and time. Vital signs are normal. He appears well-developed and well-nourished. He is cooperative.  HENT:  Head: Normocephalic and atraumatic.  Mouth/Throat: Oropharynx is clear and moist and mucous membranes are normal. No oropharyngeal exudate, posterior oropharyngeal edema or posterior oropharyngeal erythema.  Mild wax right ear  Mild fluid left ear B/l hearing aids   Eyes: Pupils are equal, round, and reactive to light. Conjunctivae are normal.  Cardiovascular: Normal rate, regular rhythm and normal heart sounds.  Pulmonary/Chest: Effort normal and breath sounds normal. He has no wheezes.  Neurological: He is alert and oriented to person, place, and time.  Rolling walker today    Skin: Skin is warm, dry and intact.  Psychiatric: He has a normal mood and affect. His speech is normal and behavior is normal. Judgment and thought content normal. Cognition and memory are normal.  Nursing note and vitals reviewed.   Assessment   1. Sore throat r/o flu likely URI  Plan  1. Supportive  care  Flu test today negative  Provider: Dr. Olivia Mackie McLean-Scocuzza-Internal Medicine

## 2018-05-22 NOTE — Patient Instructions (Signed)
Upper Respiratory Infection, Adult Most upper respiratory infections (URIs) are caused by a virus. A URI affects the nose, throat, and upper air passages. The most common type of URI is often called "the common cold." Follow these instructions at home:  Take medicines only as told by your doctor.  Gargle warm saltwater or take cough drops to comfort your throat as told by your doctor.  Use a warm mist humidifier or inhale steam from a shower to increase air moisture. This may make it easier to breathe.  Drink enough fluid to keep your pee (urine) clear or pale yellow.  Eat soups and other clear broths.  Have a healthy diet.  Rest as needed.  Go back to work when your fever is gone or your doctor says it is okay. ? You may need to stay home longer to avoid giving your URI to others. ? You can also wear a face mask and wash your hands often to prevent spread of the virus.  Use your inhaler more if you have asthma.  Do not use any tobacco products, including cigarettes, chewing tobacco, or electronic cigarettes. If you need help quitting, ask your doctor. Contact a doctor if:  You are getting worse, not better.  Your symptoms are not helped by medicine.  You have chills.  You are getting more short of breath.  You have brown or red mucus.  You have yellow or brown discharge from your nose.  You have pain in your face, especially when you bend forward.  You have a fever.  You have puffy (swollen) neck glands.  You have pain while swallowing.  You have white areas in the back of your throat. Get help right away if:  You have very bad or constant: ? Headache. ? Ear pain. ? Pain in your forehead, behind your eyes, and over your cheekbones (sinus pain). ? Chest pain.  You have long-lasting (chronic) lung disease and any of the following: ? Wheezing. ? Long-lasting cough. ? Coughing up blood. ? A change in your usual mucus.  You have a stiff neck.  You have  changes in your: ? Vision. ? Hearing. ? Thinking. ? Mood. This information is not intended to replace advice given to you by your health care provider. Make sure you discuss any questions you have with your health care provider. Document Released: 02/16/2008 Document Revised: 05/02/2016 Document Reviewed: 12/05/2013 Elsevier Interactive Patient Education  2018 [place].  Sore Throat A sore throat is pain, burning, irritation, or scratchiness in the throat. When you have a sore throat, you may feel pain or tenderness in your throat when you swallow or talk. Many things can cause a sore throat, including:  An infection.  Seasonal allergies.  Dryness in the air.  Irritants, such as smoke or pollution.  Gastroesophageal reflux disease (GERD).  A tumor.  A sore throat is often the first sign of another sickness. It may happen with other symptoms, such as coughing, sneezing, fever, and swollen neck glands. Most sore throats go away without medical treatment. Follow these instructions at home:  Take over-the-counter medicines only as told by your health care provider.  Drink enough fluids to keep your urine clear or pale yellow.  Rest as needed.  To help with pain, try: ? Sipping warm liquids, such as broth, herbal tea, or warm water. ? Eating or drinking cold or frozen liquids, such as frozen ice pops. ? Gargling with a salt-water mixture 3-4 times a day or as needed.  To make a salt-water mixture, completely dissolve -1 tsp of salt in 1 cup of warm water. ? Sucking on hard candy or throat lozenges. ? Putting a cool-mist humidifier in your bedroom at night to moisten the air. ? Sitting in the bathroom with the door closed for 5-10 minutes while you run hot water in the shower.  Do not use any tobacco products, such as cigarettes, chewing tobacco, and e-cigarettes. If you need help quitting, ask your health care provider. Contact a health care provider if:  You have a fever  for more than 2-3 days.  You have symptoms that last (are persistent) for more than 2-3 days.  Your throat does not get better within 7 days.  You have a fever and your symptoms suddenly get worse. Get help right away if:  You have difficulty breathing.  You cannot swallow fluids, soft foods, or your saliva.  You have increased swelling in your throat or neck.  You have persistent nausea and vomiting. This information is not intended to replace advice given to you by your health care provider. Make sure you discuss any questions you have with your health care provider. Document Released: 10/07/2004 Document Revised: 04/25/2016 Document Reviewed: 06/20/2015 Elsevier Interactive Patient Education  [employer].

## 2018-05-22 NOTE — Progress Notes (Signed)
Pre visit review using our clinic review tool, if applicable. No additional management support is needed unless otherwise documented below in the visit note. 

## 2018-05-23 DIAGNOSIS — I129 Hypertensive chronic kidney disease with stage 1 through stage 4 chronic kidney disease, or unspecified chronic kidney disease: Secondary | ICD-10-CM | POA: Diagnosis not present

## 2018-05-23 DIAGNOSIS — M7981 Nontraumatic hematoma of soft tissue: Secondary | ICD-10-CM | POA: Diagnosis not present

## 2018-05-23 DIAGNOSIS — M48061 Spinal stenosis, lumbar region without neurogenic claudication: Secondary | ICD-10-CM | POA: Diagnosis not present

## 2018-05-23 DIAGNOSIS — Z471 Aftercare following joint replacement surgery: Secondary | ICD-10-CM | POA: Diagnosis not present

## 2018-05-23 DIAGNOSIS — E1122 Type 2 diabetes mellitus with diabetic chronic kidney disease: Secondary | ICD-10-CM | POA: Diagnosis not present

## 2018-05-23 DIAGNOSIS — E1140 Type 2 diabetes mellitus with diabetic neuropathy, unspecified: Secondary | ICD-10-CM | POA: Diagnosis not present

## 2018-05-24 DIAGNOSIS — I480 Paroxysmal atrial fibrillation: Secondary | ICD-10-CM | POA: Diagnosis not present

## 2018-05-24 DIAGNOSIS — G309 Alzheimer's disease, unspecified: Secondary | ICD-10-CM | POA: Diagnosis not present

## 2018-05-24 DIAGNOSIS — Z8669 Personal history of other diseases of the nervous system and sense organs: Secondary | ICD-10-CM | POA: Diagnosis not present

## 2018-05-24 DIAGNOSIS — R197 Diarrhea, unspecified: Secondary | ICD-10-CM | POA: Diagnosis not present

## 2018-05-24 DIAGNOSIS — G4733 Obstructive sleep apnea (adult) (pediatric): Secondary | ICD-10-CM | POA: Diagnosis not present

## 2018-05-24 DIAGNOSIS — F0280 Dementia in other diseases classified elsewhere without behavioral disturbance: Secondary | ICD-10-CM | POA: Diagnosis not present

## 2018-05-24 DIAGNOSIS — F0150 Vascular dementia without behavioral disturbance: Secondary | ICD-10-CM | POA: Diagnosis not present

## 2018-05-29 DIAGNOSIS — R350 Frequency of micturition: Secondary | ICD-10-CM | POA: Diagnosis not present

## 2018-05-29 DIAGNOSIS — N3941 Urge incontinence: Secondary | ICD-10-CM | POA: Diagnosis not present

## 2018-05-29 DIAGNOSIS — R3915 Urgency of urination: Secondary | ICD-10-CM | POA: Diagnosis not present

## 2018-05-30 DIAGNOSIS — Z471 Aftercare following joint replacement surgery: Secondary | ICD-10-CM | POA: Diagnosis not present

## 2018-05-30 DIAGNOSIS — I129 Hypertensive chronic kidney disease with stage 1 through stage 4 chronic kidney disease, or unspecified chronic kidney disease: Secondary | ICD-10-CM | POA: Diagnosis not present

## 2018-05-30 DIAGNOSIS — M48061 Spinal stenosis, lumbar region without neurogenic claudication: Secondary | ICD-10-CM | POA: Diagnosis not present

## 2018-05-30 DIAGNOSIS — E1140 Type 2 diabetes mellitus with diabetic neuropathy, unspecified: Secondary | ICD-10-CM | POA: Diagnosis not present

## 2018-05-30 DIAGNOSIS — E1122 Type 2 diabetes mellitus with diabetic chronic kidney disease: Secondary | ICD-10-CM | POA: Diagnosis not present

## 2018-05-30 DIAGNOSIS — M7981 Nontraumatic hematoma of soft tissue: Secondary | ICD-10-CM | POA: Diagnosis not present

## 2018-05-31 ENCOUNTER — Other Ambulatory Visit: Payer: Self-pay | Admitting: Urology

## 2018-06-01 DIAGNOSIS — I129 Hypertensive chronic kidney disease with stage 1 through stage 4 chronic kidney disease, or unspecified chronic kidney disease: Secondary | ICD-10-CM | POA: Diagnosis not present

## 2018-06-01 DIAGNOSIS — E1122 Type 2 diabetes mellitus with diabetic chronic kidney disease: Secondary | ICD-10-CM | POA: Diagnosis not present

## 2018-06-01 DIAGNOSIS — M48061 Spinal stenosis, lumbar region without neurogenic claudication: Secondary | ICD-10-CM | POA: Diagnosis not present

## 2018-06-01 DIAGNOSIS — Z471 Aftercare following joint replacement surgery: Secondary | ICD-10-CM | POA: Diagnosis not present

## 2018-06-01 DIAGNOSIS — M7981 Nontraumatic hematoma of soft tissue: Secondary | ICD-10-CM | POA: Diagnosis not present

## 2018-06-01 DIAGNOSIS — E1140 Type 2 diabetes mellitus with diabetic neuropathy, unspecified: Secondary | ICD-10-CM | POA: Diagnosis not present

## 2018-06-02 ENCOUNTER — Ambulatory Visit: Payer: Medicare Other | Admitting: Urology

## 2018-06-02 ENCOUNTER — Institutional Professional Consult (permissible substitution): Payer: Medicare Other | Admitting: Pulmonary Disease

## 2018-06-05 DIAGNOSIS — Z471 Aftercare following joint replacement surgery: Secondary | ICD-10-CM | POA: Diagnosis not present

## 2018-06-05 DIAGNOSIS — M7981 Nontraumatic hematoma of soft tissue: Secondary | ICD-10-CM | POA: Diagnosis not present

## 2018-06-05 DIAGNOSIS — I129 Hypertensive chronic kidney disease with stage 1 through stage 4 chronic kidney disease, or unspecified chronic kidney disease: Secondary | ICD-10-CM | POA: Diagnosis not present

## 2018-06-05 DIAGNOSIS — E1140 Type 2 diabetes mellitus with diabetic neuropathy, unspecified: Secondary | ICD-10-CM | POA: Diagnosis not present

## 2018-06-05 DIAGNOSIS — E1122 Type 2 diabetes mellitus with diabetic chronic kidney disease: Secondary | ICD-10-CM | POA: Diagnosis not present

## 2018-06-05 DIAGNOSIS — M48061 Spinal stenosis, lumbar region without neurogenic claudication: Secondary | ICD-10-CM | POA: Diagnosis not present

## 2018-06-08 DIAGNOSIS — M7981 Nontraumatic hematoma of soft tissue: Secondary | ICD-10-CM | POA: Diagnosis not present

## 2018-06-08 DIAGNOSIS — M48061 Spinal stenosis, lumbar region without neurogenic claudication: Secondary | ICD-10-CM | POA: Diagnosis not present

## 2018-06-08 DIAGNOSIS — I129 Hypertensive chronic kidney disease with stage 1 through stage 4 chronic kidney disease, or unspecified chronic kidney disease: Secondary | ICD-10-CM | POA: Diagnosis not present

## 2018-06-08 DIAGNOSIS — Z471 Aftercare following joint replacement surgery: Secondary | ICD-10-CM | POA: Diagnosis not present

## 2018-06-08 DIAGNOSIS — E1140 Type 2 diabetes mellitus with diabetic neuropathy, unspecified: Secondary | ICD-10-CM | POA: Diagnosis not present

## 2018-06-08 DIAGNOSIS — E1122 Type 2 diabetes mellitus with diabetic chronic kidney disease: Secondary | ICD-10-CM | POA: Diagnosis not present

## 2018-06-09 ENCOUNTER — Institutional Professional Consult (permissible substitution): Payer: Medicare Other | Admitting: Pulmonary Disease

## 2018-06-09 DIAGNOSIS — G4733 Obstructive sleep apnea (adult) (pediatric): Secondary | ICD-10-CM | POA: Diagnosis not present

## 2018-06-09 DIAGNOSIS — E1159 Type 2 diabetes mellitus with other circulatory complications: Secondary | ICD-10-CM | POA: Diagnosis not present

## 2018-06-09 DIAGNOSIS — I2510 Atherosclerotic heart disease of native coronary artery without angina pectoris: Secondary | ICD-10-CM | POA: Diagnosis not present

## 2018-06-09 DIAGNOSIS — I10 Essential (primary) hypertension: Secondary | ICD-10-CM | POA: Diagnosis not present

## 2018-06-09 DIAGNOSIS — E782 Mixed hyperlipidemia: Secondary | ICD-10-CM | POA: Diagnosis not present

## 2018-06-09 DIAGNOSIS — I480 Paroxysmal atrial fibrillation: Secondary | ICD-10-CM | POA: Diagnosis not present

## 2018-06-09 DIAGNOSIS — Z8673 Personal history of transient ischemic attack (TIA), and cerebral infarction without residual deficits: Secondary | ICD-10-CM | POA: Diagnosis not present

## 2018-06-13 ENCOUNTER — Ambulatory Visit: Payer: Medicare Other | Admitting: Urology

## 2018-06-13 ENCOUNTER — Encounter: Payer: Self-pay | Admitting: Emergency Medicine

## 2018-06-13 ENCOUNTER — Emergency Department: Payer: Medicare Other

## 2018-06-13 ENCOUNTER — Other Ambulatory Visit: Payer: Self-pay

## 2018-06-13 ENCOUNTER — Emergency Department
Admission: EM | Admit: 2018-06-13 | Discharge: 2018-06-13 | Disposition: A | Discharge status: Home / Self Care | Payer: Medicare Other | Attending: Student in an Organized Health Care Education/Training Program | Admitting: Student in an Organized Health Care Education/Training Program

## 2018-06-13 DIAGNOSIS — E119 Type 2 diabetes mellitus without complications: Secondary | ICD-10-CM | POA: Insufficient documentation

## 2018-06-13 DIAGNOSIS — Z87891 Personal history of nicotine dependence: Secondary | ICD-10-CM | POA: Diagnosis not present

## 2018-06-13 DIAGNOSIS — R202 Paresthesia of skin: Secondary | ICD-10-CM

## 2018-06-13 DIAGNOSIS — R51 Headache: Secondary | ICD-10-CM | POA: Diagnosis not present

## 2018-06-13 DIAGNOSIS — R519 Headache, unspecified: Secondary | ICD-10-CM

## 2018-06-13 DIAGNOSIS — Z7982 Long term (current) use of aspirin: Secondary | ICD-10-CM | POA: Diagnosis not present

## 2018-06-13 DIAGNOSIS — R4781 Slurred speech: Secondary | ICD-10-CM | POA: Diagnosis not present

## 2018-06-13 DIAGNOSIS — I2510 Atherosclerotic heart disease of native coronary artery without angina pectoris: Secondary | ICD-10-CM | POA: Diagnosis not present

## 2018-06-13 DIAGNOSIS — Z7902 Long term (current) use of antithrombotics/antiplatelets: Secondary | ICD-10-CM | POA: Diagnosis not present

## 2018-06-13 DIAGNOSIS — G40909 Epilepsy, unspecified, not intractable, without status epilepticus: Secondary | ICD-10-CM | POA: Diagnosis not present

## 2018-06-13 DIAGNOSIS — I10 Essential (primary) hypertension: Secondary | ICD-10-CM | POA: Diagnosis not present

## 2018-06-13 DIAGNOSIS — Z794 Long term (current) use of insulin: Secondary | ICD-10-CM | POA: Diagnosis not present

## 2018-06-13 DIAGNOSIS — R531 Weakness: Secondary | ICD-10-CM | POA: Diagnosis not present

## 2018-06-13 DIAGNOSIS — Z79899 Other long term (current) drug therapy: Secondary | ICD-10-CM | POA: Insufficient documentation

## 2018-06-13 DIAGNOSIS — R0602 Shortness of breath: Secondary | ICD-10-CM | POA: Diagnosis not present

## 2018-06-13 DIAGNOSIS — R200 Anesthesia of skin: Secondary | ICD-10-CM | POA: Diagnosis not present

## 2018-06-13 DIAGNOSIS — Z96643 Presence of artificial hip joint, bilateral: Secondary | ICD-10-CM | POA: Diagnosis not present

## 2018-06-13 LAB — DIFFERENTIAL
BASOS ABS: 0.1 10*3/uL (ref 0–0.1)
Basophils Relative: 1 %
Eosinophils Absolute: 0.5 10*3/uL (ref 0–0.7)
Eosinophils Relative: 7 %
LYMPHS ABS: 1.4 10*3/uL (ref 1.0–3.6)
LYMPHS PCT: 18 %
MONOS PCT: 10 %
Monocytes Absolute: 0.8 10*3/uL (ref 0.2–1.0)
NEUTROS ABS: 5.1 10*3/uL (ref 1.4–6.5)
NEUTROS PCT: 64 %

## 2018-06-13 LAB — COMPREHENSIVE METABOLIC PANEL
ALBUMIN: 4.3 g/dL (ref 3.5–5.0)
ALK PHOS: 86 U/L (ref 38–126)
ALT: 33 U/L (ref 0–44)
AST: 34 U/L (ref 15–41)
Anion gap: 11 (ref 5–15)
BILIRUBIN TOTAL: 0.7 mg/dL (ref 0.3–1.2)
BUN: 16 mg/dL (ref 8–23)
CALCIUM: 9.8 mg/dL (ref 8.9–10.3)
CO2: 25 mmol/L (ref 22–32)
CREATININE: 0.82 mg/dL (ref 0.61–1.24)
Chloride: 100 mmol/L (ref 98–111)
GFR calc Af Amer: >60 mL/min (ref >60)
GLUCOSE: 150 mg/dL — AB (ref 70–99)
Potassium: 4.3 mmol/L (ref 3.5–5.1)
Sodium: 136 mmol/L (ref 135–145)
TOTAL PROTEIN: 8.3 g/dL — AB (ref 6.5–8.1)

## 2018-06-13 LAB — CBC
HEMATOCRIT: 39.5 % — AB (ref 40.0–52.0)
HEMOGLOBIN: 13.8 g/dL (ref 13.0–18.0)
MCH: 33.7 pg (ref 26.0–34.0)
MCHC: 35 g/dL (ref 32.0–36.0)
MCV: 96.2 fL (ref 80.0–100.0)
Platelets: 237 10*3/uL (ref 150–440)
RBC: 4.11 MIL/uL — AB (ref 4.40–5.90)
RDW: 14.7 % — ABNORMAL HIGH (ref 11.5–14.5)
WBC: 7.9 10*3/uL (ref 3.8–10.6)

## 2018-06-13 LAB — URINALYSIS, COMPLETE (UACMP) WITH MICROSCOPIC
BILIRUBIN URINE: NEGATIVE
Bacteria, UA: NONE SEEN
Glucose, UA: NEGATIVE mg/dL
Hgb urine dipstick: NEGATIVE
KETONES UR: NEGATIVE mg/dL
LEUKOCYTES UA: NEGATIVE
Nitrite: NEGATIVE
PROTEIN: NEGATIVE mg/dL
SQUAMOUS EPITHELIAL / LPF: NONE SEEN (ref 0–5)
Specific Gravity, Urine: 1.018 (ref 1.005–1.030)
pH: 5 (ref 5.0–8.0)

## 2018-06-13 LAB — PROTIME-INR
INR: 1.06
PROTHROMBIN TIME: 13.7 s (ref 11.4–15.2)

## 2018-06-13 LAB — TROPONIN I

## 2018-06-13 LAB — APTT: aPTT: 32 seconds (ref 24–36)

## 2018-06-13 LAB — GLUCOSE, CAPILLARY: Glucose-Capillary: 141 mg/dL — ABNORMAL HIGH (ref 70–99)

## 2018-06-13 MED ORDER — ACETAMINOPHEN 500 MG PO TABS
1000.0000 mg | ORAL_TABLET | Freq: Once | ORAL | Status: AC
  Administered 2018-06-13: 1000 mg via ORAL
  Filled 2018-06-13: qty 2

## 2018-06-13 MED ORDER — IOHEXOL 350 MG/ML SOLN
75.0000 mL | Freq: Once | INTRAVENOUS | Status: AC | PRN
  Administered 2018-06-13: 75 mL via INTRAVENOUS

## 2018-06-13 MED ORDER — METOCLOPRAMIDE HCL 5 MG/ML IJ SOLN
10.0000 mg | Freq: Once | INTRAMUSCULAR | Status: AC
  Administered 2018-06-13: 10 mg via INTRAVENOUS
  Filled 2018-06-13 (×2): qty 2

## 2018-06-13 NOTE — ED Notes (Signed)
MD McShane aware, stroke orders to be placed but pt outside window

## 2018-06-13 NOTE — ED Notes (Signed)
Pt c/o "feeling like I am breathing like a racing horse." VS taken and documented.

## 2018-06-13 NOTE — Discharge Instructions (Signed)

## 2018-06-13 NOTE — ED Triage Notes (Addendum)
Pt to ED via POV with spouse, c/o RT sided weakness and slurred speech upon awakening at aprox 0715 this am. LKW was last night before bed. PT also states headache. PT A&OX4

## 2018-06-13 NOTE — ED Provider Notes (Signed)
[facility] Emergency Department Provider Note    First MD Initiated Contact with Patient 06/13/18 1406     (approximate)  I have reviewed the triage vital signs and the nursing notes.   HISTORY  Chief Complaint Weakness    HPI Chase Cabrera is a 72 y.o. male presents to the ER for evaluation of abnormal speech upon awakening and bilateral hand and finger numbness and tingling.  Symptoms resolved after few minutes so family did not present to the ER but while talking later this afternoon had recurrence of tingling to his hands.  Is on both sides.  Wife was concerned that she noted some facial droop which is also resolved.  Patient now complaining of mild frontal headache.  Patient recently admitted to the hospital for similar symptoms with extensive neuro evaluation.    Past Medical History:  Diagnosis Date  . Arthritis   . Atrial fibrillation ([place])   . CAD (coronary artery disease)    6 STENTS  . CHF (congestive heart failure) ([place])   . Chicken pox   . Colon polyp   . Corneal dystrophy    bilateral  . Crohn's disease ([place])   . Diabetes ([place]) 2002  . Dysrhythmia    A-FIB AND PAF  . GERD (gastroesophageal reflux disease)   . Gout   . History of kidney stones   . History of shingles   . Hyperlipemia   . Hypertension   . Kidney stones   . Myocardial infarction ([place])    x4, last one in 2010  . Peripheral neuropathy   . Peripheral neuropathy   . Peripheral neuropathy   . PUD (peptic ulcer disease)   . Rectus sheath hematoma    02/18/18 8.9 x 4.5 cm then 02/19/18 7.9 x 4.5 cm   . Renal artery stenosis ([place])   . Renal artery stenosis ([place])   . Salzmann's nodular dystrophy 2012  . Sleep apnea   . Spinal stenosis    Family History  Problem Relation Age of Onset  . CVA Father   . Stroke Father   . Lung cancer Mother   . Arthritis Mother   . Stomach cancer Sister   . Colon cancer Sister   . Diabetes Brother    Past Surgical History:    Procedure Laterality Date  . ABLATION  2012  . APPLICATION VERTERBRAL DEFECT PROSTHETIC  05/01/2013  . ARTHRODESIS ANTERIOR LUMBAR SPINE  05/01/2013  . BACK SURGERY     lumbar fusion  . CARDIAC CATHETERIZATION    . CARDIAC ELECTROPHYSIOLOGY STUDY AND ABLATION    . CARDIAC SURGERY    . CARDIOVERSION    . CHOLECYSTECTOMY    . COLONOSCOPY WITH PROPOFOL N/A 04/09/2016   Completed for chronic diarrhea.  Few scattered diverticuli.  No mucosal lesions.  Surgeon: Lollie Sails, MD;  Location: [facility] ENDOSCOPY;  Service: Endoscopy;  Laterality: N/A;  . CORNEAL EYE SURGERY Bilateral 07/28/11    09/22/2011  . CORONARY ANGIOPLASTY    . CORONARY ANGIOPLASTY WITH STENT PLACEMENT  03/28/2015   Distal 80% to normal Stent, Dilation Balloon  . CORONARY ARTERY BYPASS GRAFT     4 VESSELS  . EYE SURGERY     bilateral cataract 2017 Dr. Megan Mans  . foot and ankle repair Right 2005  . FRACTURE SURGERY     ANKLE PLATE AND SCREWS  . GALLBLADER    . JOINT REPLACEMENT     left total hip 11/11/15  . JOINT  REPLACEMENT     right total hip 12/2017 Dr. Rudene Christians   . KNEE ARTHROSCOPY Right   . LUMBAR SPINE FUSION ONE LEVEL  05/03/2013  . RENAL ARTERY STENT Right   . ROTATOR CUFF REPAIR Right 2006  . TONSILLECTOMY    . TOTAL HIP ARTHROPLASTY Left 11/11/2015   Procedure: TOTAL HIP ARTHROPLASTY ANTERIOR APPROACH;  Surgeon: Hessie Knows, MD;  Location: [facility] ORS;  Service: Orthopedics;  Laterality: Left;  . TOTAL HIP ARTHROPLASTY Right 12/13/2017   Procedure: TOTAL HIP ARTHROPLASTY ANTERIOR APPROACH;  Surgeon: Hessie Knows, MD;  Location: [facility] ORS;  Service: Orthopedics;  Laterality: Right;  . TRANSCATH PLACEMENT INTRAVASCULAR STENT LEG  03/2015   Patient Active Problem List   Diagnosis Date Noted  . TIA (transient ischemic attack) 04/30/2018  . Abnormal thyroid function test 04/03/2018  . Leukocytosis 04/03/2018  . Hematoma of rectus sheath 02/21/2018  . Carotid artery stenosis 02/16/2018  . Altered mental status  02/09/2018  . Sinusitis 02/09/2018  . Hypokalemia 01/20/2018  . OSA (obstructive sleep apnea) 01/12/2018  . Osteoarthritis of right hip 12/13/2017  . Intraabdominal mass 11/23/2017  . Colonic mass 11/18/2017  . Crohn's disease ([place]) 10/20/2017  . Pulmonary nodule 10/07/2017  . History of skin cancer 10/07/2017  . Rib pain on right side 03/07/2017  . Osteoarthritis 04/08/2016  . Spinal stenosis, lumbar 03/25/2016  . DM type 2 with diabetic peripheral neuropathy ([place]) 07/15/2014  . Gout 05/04/2013  . Long term current use of anticoagulant 07/29/2011  . Renal artery stenosis ([place]) 07/01/2011  . Atrial fibrillation ([place]) 06/30/2011  . CAD (coronary artery disease) 06/30/2011  . Hyperlipidemia 06/30/2011  . Essential hypertension 06/30/2011      Prior to Admission medications   Medication Sig Start Date End Date Taking? Authorizing Provider  amLODipine (NORVASC) 5 MG tablet Take 1 tablet (5 mg total) by mouth 2 (two) times daily. If blood pressure >140/>90 make take another 1 pill of 5 mg 05/01/18   Mayo, Pete Pelt, MD  amoxicillin-clavulanate (AUGMENTIN) 875-125 MG tablet Take 1 tablet by mouth 2 (two) times daily. With food 05/22/18   McLean-Scocuzza, Nino Glow, MD  aspirin EC 81 MG EC tablet Take 1 tablet (81 mg total) by mouth daily. 05/02/18   Mayo, Pete Pelt, MD  carvedilol (COREG) 25 MG tablet Take 1 tablet (25 mg total) by mouth 2 (two) times daily with a meal. 04/05/18   McLean-Scocuzza, Nino Glow, MD  Cholecalciferol 50000 units TABS Take 1 tablet by mouth once a week. Patient taking differently: Take 50,000 Units by mouth every Saturday.  10/07/17   McLean-Scocuzza, Nino Glow, MD  cholestyramine light (PREVALITE) 4 g packet Take 4 g by mouth daily with lunch.  01/31/18   [provider]  clopidogrel (PLAVIX) 75 MG tablet Take 75 mg by mouth daily.    [provider]  colchicine 0.6 MG tablet TAKE 1 TABLET BY MOUTH ONCE DAILY 07/12/17   Leone Haven, MD  diclofenac  sodium (VOLTAREN) 1 % GEL Apply 2 g topically 4 (four) times daily as needed (for pain).     [provider]  dicyclomine (BENTYL) 10 MG capsule Take 10 mg by mouth 3 (three) times daily before meals.     [provider]  fluticasone (FLONASE) 50 MCG/ACT nasal spray Place 2 sprays into both nostrils daily. 05/22/18   McLean-Scocuzza, Nino Glow, MD  gabapentin (NEURONTIN) 300 MG capsule take 2 capsules (636m) by mouth three times a day 01/16/18   McLean-Scocuzza, TOlivia Mackie  N, MD  hydrALAZINE (APRESOLINE) 100 MG tablet TAKE 100 MG BY MOUTH THREE TIMES A DAY 12/29/17   McLean-Scocuzza, Nino Glow, MD  HYDROcodone-acetaminophen (NORCO) 5-325 MG tablet Take 1 tablet by mouth every 4 (four) hours as needed for severe pain (try not to take more than 2 a day.). 02/20/18   Nena Polio, MD  insulin lispro (HUMALOG) 100 UNIT/ML KiwkPen Inject 14-32 Units into the skin 3 (three) times daily. (according to sliding scale - max 96u over 24 hours)    [provider]  ipratropium (ATROVENT) 0.06 % nasal spray Place 2 sprays into both nostrils 4 (four) times daily. 02/09/18   McLean-Scocuzza, Nino Glow, MD  isosorbide mononitrate (IMDUR) 120 MG 24 hr tablet TAKE 1 TABLET BY MOUTH EVERY DAY 10/07/17   McLean-Scocuzza, Nino Glow, MD  levocetirizine (XYZAL) 5 MG tablet Take 1 tablet (5 mg total) by mouth every evening. 02/09/18   McLean-Scocuzza, Nino Glow, MD  metFORMIN (GLUCOPHAGE) 1000 MG tablet Take 1,000 mg by mouth 2 (two) times daily with a meal.    [provider]  montelukast (SINGULAIR) 10 MG tablet Take 1 tablet (10 mg total) by mouth at bedtime. 05/09/18   McLean-Scocuzza, Nino Glow, MD  Multiple Vitamin (MULTI-VITAMINS) TABS Take 1 tablet by mouth daily. 04/15/09   [provider]  nitroGLYCERIN (NITROSTAT) 0.4 MG SL tablet Place 1 tablet (0.4 mg total) under the tongue every 5 (five) minutes x 3 doses as needed. For chest pain.  Call 911 if no relief after 3 tablets 10/03/17 11/02/21   McLean-Scocuzza, Nino Glow, MD  omega-3 acid ethyl esters (LOVAZA) 1 g capsule Take 2 capsules (2 g total) by mouth 2 (two) times daily. 05/12/18   McLean-Scocuzza, Nino Glow, MD  omeprazole (PRILOSEC) 40 MG capsule Take 40 mg by mouth 2 (two) times daily.     [provider]  pravastatin (PRAVACHOL) 40 MG tablet Take 1 tablet (40 mg total) by mouth daily. 03/15/18   McLean-Scocuzza, Nino Glow, MD  probenecid (BENEMID) 500 MG tablet Take 500 mg by mouth 2 (two) times daily.  01/09/15   [provider]  TRESIBA FLEXTOUCH 200 UNIT/ML SOPN Inject 90 Units into the skin at bedtime. 04/25/18   [provider]  vitamin C (ASCORBIC ACID) 500 MG tablet Take 500 mg by mouth daily.     [provider]  Vitamin E 400 units TABS Take 400 Units by mouth 2 (two) times daily.    [provider]    Allergies Allopurinol; Atenolol; Atorvastatin; Ramipril; Rosuvastatin; Simvastatin; Valsartan; and Cardizem [diltiazem]    Social History Social History   Tobacco Use  . Smoking status: Former Smoker    Packs/day: 1.00    Years: 3.00    Pack years: 3.00    Types: Cigarettes    Last attempt to quit: 06/30/1962    Years since quitting: 55.9  . Smokeless tobacco: Former Systems developer    Types: Chew    Quit date: 12/05/1968  Substance Use Topics  . Alcohol use: No  . Drug use: No    Review of Systems Patient denies headaches, rhinorrhea, blurry vision, numbness, shortness of breath, chest pain, edema, cough, abdominal pain, nausea, vomiting, diarrhea, dysuria, fevers, rashes or hallucinations unless otherwise stated above in HPI. ____________________________________________   PHYSICAL EXAM:  VITAL SIGNS: Vitals:   06/13/18 1715 06/13/18 1730  BP:    Pulse:  (!) 57  Resp: 20 (!) 22  SpO2:  93%    Constitutional: Alert  and oriented.  Eyes: Conjunctivae are normal.  Head: Atraumatic. Nose: No congestion/rhinnorhea. Mouth/Throat: Mucous membranes are moist.   Neck: No  stridor. Painless ROM.  Cardiovascular: Normal rate, regular rhythm. Grossly normal heart sounds.  Good peripheral circulation. Respiratory: Normal respiratory effort.  No retractions. Lungs CTAB. Gastrointestinal: Soft and nontender. No distention. No abdominal bruits. No CVA tenderness. Genitourinary:  Musculoskeletal: No lower extremity tenderness nor edema.  No joint effusions. Neurologic:  CN- intact.  No facial droop, Normal FNF.  Normal heel to shin.  Sensation intact bilaterally. Normal speech and language. No gross focal neurologic deficits are appreciated. No gait instability.  Skin:  Skin is warm, dry and intact. No rash noted. Psychiatric: Mood and affect are normal. Speech and behavior are normal.  ____________________________________________   LABS (all labs ordered are listed, but only abnormal results are displayed)  Results for orders placed or performed during the hospital encounter of 06/13/18 (from the past 24 hour(s))  Protime-INR     Status: None   Collection Time: 06/13/18 11:52 AM  Result Value Ref Range   Prothrombin Time 13.7 11.4 - 15.2 seconds   INR 1.06   APTT     Status: None   Collection Time: 06/13/18 11:52 AM  Result Value Ref Range   aPTT 32 24 - 36 seconds  CBC     Status: Abnormal   Collection Time: 06/13/18 11:52 AM  Result Value Ref Range   WBC 7.9 3.8 - 10.6 K/uL   RBC 4.11 (L) 4.40 - 5.90 MIL/uL   Hemoglobin 13.8 13.0 - 18.0 g/dL   HCT 39.5 (L) 40.0 - 52.0 %   MCV 96.2 80.0 - 100.0 fL   MCH 33.7 26.0 - 34.0 pg   MCHC 35.0 32.0 - 36.0 g/dL   RDW 14.7 (H) 11.5 - 14.5 %   Platelets 237 150 - 440 K/uL  Differential     Status: None   Collection Time: 06/13/18 11:52 AM  Result Value Ref Range   Neutrophils Relative % 64 %   Neutro Abs 5.1 1.4 - 6.5 K/uL   Lymphocytes Relative 18 %   Lymphs Abs 1.4 1.0 - 3.6 K/uL   Monocytes Relative 10 %   Monocytes Absolute 0.8 0.2 - 1.0 K/uL   Eosinophils Relative 7 %   Eosinophils Absolute 0.5 0 -  0.7 K/uL   Basophils Relative 1 %   Basophils Absolute 0.1 0 - 0.1 K/uL  Comprehensive metabolic panel     Status: Abnormal   Collection Time: 06/13/18 11:52 AM  Result Value Ref Range   Sodium 136 135 - 145 mmol/L   Potassium 4.3 3.5 - 5.1 mmol/L   Chloride 100 98 - 111 mmol/L   CO2 25 22 - 32 mmol/L   Glucose, Bld 150 (H) 70 - 99 mg/dL   BUN 16 8 - 23 mg/dL   Creatinine, Ser 0.82 0.61 - 1.24 mg/dL   Calcium 9.8 8.9 - 10.3 mg/dL   Total Protein 8.3 (H) 6.5 - 8.1 g/dL   Albumin 4.3 3.5 - 5.0 g/dL   AST 34 15 - 41 U/L   ALT 33 0 - 44 U/L   Alkaline Phosphatase 86 38 - 126 U/L   Total Bilirubin 0.7 0.3 - 1.2 mg/dL   GFR calc non Af Amer >60 >60 mL/min   GFR calc Af Amer >60 >60 mL/min   Anion gap 11 5 - 15  Troponin I     Status: None   Collection Time: 06/13/18  11:52 AM  Result Value Ref Range   Troponin I <0.03 <0.03 ng/mL  Glucose, capillary     Status: Abnormal   Collection Time: 06/13/18 11:54 AM  Result Value Ref Range   Glucose-Capillary 141 (H) 70 - 99 mg/dL   Comment 1 Notify RN    Comment 2 Document in Chart   Urinalysis, Complete w Microscopic     Status: Abnormal   Collection Time: 06/13/18  3:51 PM  Result Value Ref Range   Color, Urine YELLOW (A) YELLOW   APPearance CLEAR (A) CLEAR   Specific Gravity, Urine 1.018 1.005 - 1.030   pH 5.0 5.0 - 8.0   Glucose, UA NEGATIVE NEGATIVE mg/dL   Hgb urine dipstick NEGATIVE NEGATIVE   Bilirubin Urine NEGATIVE NEGATIVE   Ketones, ur NEGATIVE NEGATIVE mg/dL   Protein, ur NEGATIVE NEGATIVE mg/dL   Nitrite NEGATIVE NEGATIVE   Leukocytes, UA NEGATIVE NEGATIVE   RBC / HPF 0-5 0 - 5 RBC/hpf   WBC, UA 0-5 0 - 5 WBC/hpf   Bacteria, UA NONE SEEN NONE SEEN   Squamous Epithelial / LPF NONE SEEN 0 - 5   Triple Phosphate Crystal PRESENT    ____________________________________________  EKG My review and personal interpretation at Time: 12:01   Indication: weakness  Rate: 60  Rhythm: sinus Axis: left Other: normal intervals,  no stemi, nonspecific st abn, abn ekg ____________________________________________  RADIOLOGY  I personally reviewed all radiographic images ordered to evaluate for the above acute complaints and reviewed radiology reports and findings.  These findings were personally discussed with the patient.  Please see medical record for radiology report.  ____________________________________________   PROCEDURES  Procedure(s) performed:  Procedures Due to difficulty with obtaining IV access, a 20G peripheral IV catheter was inserted using US guidance into the right forearm.  The site was prepped with chlorhexidine and allowed to dry.  The patient tolerated the procedure without any complications.    Critical Care performed: no ____________________________________________   INITIAL IMPRESSION / ASSESSMENT AND PLAN / ED COURSE  Pertinent labs & imaging results that were available during my care of the patient were reviewed by me and considered in my medical decision making (see chart for details).   DDX: tia, anemia, uti, electrolyte abn, acs, pe, dementia  Chase Cabrera is a 72 y.o. who presents to the ED with symptoms as described above.  Patient without any focal deficits at this time.  Symptoms seem to be fairly vague and are bilaterally reported.  Not consistent with CVA.  Patient with extensive work-up for CVA just this past month.  Blood work will be sent for the above differential.  Clinical Course as of Jun 14 1915  Tue Jun 13, 2018  1506 Reviewed extensive past medical history and recent hospitalization with recent visit to neurologist.  Does have underlying vascular dementia.  Do not feel that repeat stroke evaluation indicated at this time.  No hypoxia.  Blood work is otherwise reassuring.  We will touch base with his neurologist to see if he has any further recommendations or additional testing they would request.   [PR]  1521 Discussed with Dr. Manuella Ghazi.  Given extensive recent  medical work-up with vague symptoms today now back to baseline with extensive neurological work-up and underlying vascular dementia there is limited additional diagnostic abilities at this facility at this time and does not seem to be an acute process.  Likely worsening of underlying dementia.Marland Kitchen do not believe there is any indication for hospitalization at  this time.   [PR]  1613 Patient is complaining of more shortness of breath at this time.  As he is not anticoagulated will order CT imaging to further evaluate as he is also been in and out of the hospital and is a moderate risk for PE.   [PR]  1901 CTA shows no sign of PE.  No pneumonia.  No evidence of edema.  At this point I do believe he stable and appropriate for outpatient follow-up.   [PR]    Clinical Course User Index [PR] Merlyn Lot, MD     As part of my medical decision making, I reviewed the following data within the [place] notes reviewed and incorporated, Labs reviewed, notes from prior ED visits and [place] Controlled Substance Database   ____________________________________________   FINAL CLINICAL IMPRESSION(S) / ED DIAGNOSES  Final diagnoses:  Weakness  Paresthesia  Acute nonintractable headache, unspecified headache type      NEW MEDICATIONS STARTED DURING THIS VISIT:  New Prescriptions   No medications on file     Note:  This document was prepared using Dragon voice recognition software and may include unintentional dictation errors.    Merlyn Lot, MD 06/13/18 [phone]

## 2018-06-14 ENCOUNTER — Encounter: Payer: Self-pay | Admitting: Pulmonary Disease

## 2018-06-14 ENCOUNTER — Ambulatory Visit (INDEPENDENT_AMBULATORY_CARE_PROVIDER_SITE_OTHER): Payer: Medicare Other | Admitting: Pulmonary Disease

## 2018-06-14 VITALS — BP 140/58 | HR 64 | Ht 68.0 in | Wt 194.4 lb

## 2018-06-14 DIAGNOSIS — J479 Bronchiectasis, uncomplicated: Secondary | ICD-10-CM | POA: Diagnosis not present

## 2018-06-14 DIAGNOSIS — I2729 Other secondary pulmonary hypertension: Secondary | ICD-10-CM | POA: Diagnosis not present

## 2018-06-14 DIAGNOSIS — R0602 Shortness of breath: Secondary | ICD-10-CM

## 2018-06-14 DIAGNOSIS — J329 Chronic sinusitis, unspecified: Secondary | ICD-10-CM | POA: Diagnosis not present

## 2018-06-14 DIAGNOSIS — I639 Cerebral infarction, unspecified: Secondary | ICD-10-CM | POA: Diagnosis not present

## 2018-06-14 DIAGNOSIS — I5030 Unspecified diastolic (congestive) heart failure: Secondary | ICD-10-CM | POA: Diagnosis not present

## 2018-06-14 DIAGNOSIS — G4733 Obstructive sleep apnea (adult) (pediatric): Secondary | ICD-10-CM | POA: Diagnosis not present

## 2018-06-14 MED ORDER — SODIUM CHLORIDE 3 % IN NEBU: INHALATION_SOLUTION | Freq: Two times a day (BID) | RESPIRATORY_TRACT | 11 refills | Status: AC

## 2018-06-14 NOTE — Addendum Note (Signed)
Addended by: Dolores Lory on: 06/14/2018 05:16 PM   Modules accepted: Orders

## 2018-06-14 NOTE — Patient Instructions (Signed)
Chronic sinus infections and daily mucus production: I will request records from [facility] You need to start using saline rinses for your sinuses, I recommend NeilMed rinses Continue Singulair Continue Flonase Continue Zyrtec  Bronchiectasis left lower lobe and granulomatous changes seen on the CT scan of your chest: I think this is scarring related to the pneumonia he had at age 72 Start using hypertonic saline twice a day Spirometry testing today Check oxygen level while walking today Please provide [place] with a sample of your mucus so that we can send it for testing for bacterial, fungal, AFB organisms  Dyspnea: Work-up as above  Pulmonary hypertension: We will check an overnight oxygen test, I think we need to revisit how we can treat the sleep apnea  Sleep apnea: As above  We will see you back in 4 to 6 weeks to go over the results of the mucus culture

## 2018-06-14 NOTE — Addendum Note (Signed)
Addended by: Dolores Lory on: 06/14/2018 05:18 PM   Modules accepted: Orders

## 2018-06-14 NOTE — Progress Notes (Addendum)
Synopsis: Referred in October 2019 for productive cough and dyspnea  Subjective:   PATIENT ID: Chase Cabrera GENDER: male DOB: [birth year], MRN: [id number]   HPI  Chief Complaint  Patient presents with  . pulmonary consult    gets very SOB with any activity.  Prod cough orange colored.      This is a pleasant 72 year old male who comes to my clinic today for evaluation of recurrent cough, shortness of breath and daily mucus production.  He tells me that he smoked briefly and quit at age 18.  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  He had pneumonia at age 57 and says that he was not hospitalized for it.  He says that he never had childhood asthma or other respiratory problems.  For as long as he can remember he has had ongoing sinus congestion and mucus production which lasts all day long.  Despite seeing ear nose and throat in the past this has never improved.  He is coming to see me because of worsening shortness of breath.  He says that this occurs with any activity just walking 10 or 15 feet will make him short of breath.  He does not do any exercise at all.  He has extensive cardiac history and has had a coronary artery bypass graft and several stents placed.  He also has evidence of pulmonary hypertension on an echocardiogram but he and his family do not seem to know much about that today when I asked him about it.  He has been told in the past that he has obstructive sleep apnea but he refused BiPAP because he says that it was too difficult to use with all of his sinus congestion.  He had pneumonia about 4 or 5 years ago after a hip surgery.  He says that he coughs up mucus every day which is green to sometimes orange in color.  He and his family say that several times over the last several months he has had episodes where he will suddenly start to breathe very quickly for no explanation.  This most  recently happened yesterday and so he went to the emergency room.  There he had a normal CT angiogram of his chest and he was discharged home.  Past Medical History:  Diagnosis Date  . Arthritis   . Atrial fibrillation ([place])   . CAD (coronary artery disease)    6 STENTS  . CHF (congestive heart failure) ([place])   . Chicken pox   . Colon polyp   . Corneal dystrophy    bilateral  . Crohn's disease ([place])   . Diabetes ([place]) 2002  . Dysrhythmia    A-FIB AND PAF  . GERD (gastroesophageal reflux disease)   . Gout   . History of kidney stones   . History of shingles   . Hyperlipemia   . Hypertension   . Kidney stones   . Myocardial infarction ([place])    x4, last one in 2010  . Peripheral neuropathy   . Peripheral neuropathy   . Peripheral neuropathy   . PUD (peptic ulcer disease)   . Rectus sheath hematoma    02/18/18 8.9 x 4.5 cm then 02/19/18 7.9 x 4.5 cm   . Renal artery stenosis ([place])   . Renal artery stenosis ([place])   . Salzmann's nodular dystrophy 2012  . Sleep apnea   . Spinal stenosis  Family History  Problem Relation Age of Onset  . CVA Father   . Stroke Father   . Lung cancer Mother   . Arthritis Mother   . Stomach cancer Sister   . Colon cancer Sister   . Diabetes Brother      Social History   Socioeconomic History  . Marital status: Married    Spouse name: Not on file  . Number of children: Not on file  . Years of education: Not on file  . Highest education level: Not on file  Occupational History  . Not on file  Social Needs  . Financial resource strain: Not very hard  . Food insecurity:    Worry: Never true    Inability: Never true  . Transportation needs:    Medical: No    Non-medical: No  Tobacco Use  . Smoking status: Former Smoker    Packs/day: 1.00    Years: 3.00    Pack years: 3.00    Types: Cigarettes    Last attempt to quit: 06/30/1962    Years since quitting: 55.9  . Smokeless tobacco: Former Systems developer    Types: [place] date:  12/05/1968  Substance and Sexual Activity  . Alcohol use: No  . Drug use: No  . Sexual activity: Not Currently  Lifestyle  . Physical activity:    Days per week: 2 days    Minutes per session: 30 min  . Stress: Not at all  Relationships  . Social connections:    Talks on phone: Patient refused    Gets together: Patient refused    Attends religious service: Patient refused    Active member of club or organization: Patient refused    Attends meetings of clubs or organizations: Patient refused    Relationship status: Patient refused  . Intimate partner violence:    Fear of current or ex partner: No    Emotionally abused: No    Physically abused: No    Forced sexual activity: No  Other Topics Concern  . Not on file  Social History Narrative   Married      Allergies  Allergen Reactions  . Allopurinol Diarrhea, Other (See Comments) and Nausea And Vomiting    Other Reaction: GI Upset  . Atenolol Other (See Comments)    Other Reaction: bradycardia  . Atorvastatin Other (See Comments)    Other Reaction: muscle aches  . Ramipril Rash  . Rosuvastatin Other (See Comments)    Muscle aches  . Simvastatin Other (See Comments)    Other Reaction: MUSCLE ACHES (Zocor)  . Valsartan Other (See Comments)    Other Reaction: facial swelling  . Cardizem [Diltiazem] Rash     Outpatient Medications Prior to Visit  Medication Sig Dispense Refill  . amLODipine (NORVASC) 5 MG tablet Take 1 tablet (5 mg total) by mouth 2 (two) times daily. If blood pressure >140/>90 make take another 1 pill of 5 mg 180 tablet 3  . aspirin EC 81 MG EC tablet Take 1 tablet (81 mg total) by mouth daily. 30 tablet 0  . carvedilol (COREG) 25 MG tablet Take 1 tablet (25 mg total) by mouth 2 (two) times daily with a meal. 180 tablet 3  . clopidogrel (PLAVIX) 75 MG tablet Take 75 mg by mouth daily.    . colchicine 0.6 MG tablet TAKE 1 TABLET BY MOUTH ONCE DAILY 60 tablet 0  . diclofenac sodium (VOLTAREN) 1 % GEL Apply  2 g topically 4 (four)  times daily as needed (for pain).     Marland Kitchen dicyclomine (BENTYL) 10 MG capsule Take 10 mg by mouth 3 (three) times daily before meals.     . fluticasone (FLONASE) 50 MCG/ACT nasal spray Place 2 sprays into both nostrils daily. 16 g 11  . gabapentin (NEURONTIN) 300 MG capsule take 2 capsules (673m) by mouth three times a day 180 capsule 1  . hydrALAZINE (APRESOLINE) 100 MG tablet TAKE 100 MG BY MOUTH THREE TIMES A DAY 270 tablet 1  . HYDROcodone-acetaminophen (NORCO) 5-325 MG tablet Take 1 tablet by mouth every 4 (four) hours as needed for severe pain (try not to take more than 2 a day.). 6 tablet 0  . insulin lispro (HUMALOG) 100 UNIT/ML KiwkPen Inject 14-32 Units into the skin 3 (three) times daily. (according to sliding scale - max 96u over 24 hours)    . ipratropium (ATROVENT) 0.06 % nasal spray Place 2 sprays into both nostrils 4 (four) times daily. 15 mL 12  . isosorbide mononitrate (IMDUR) 120 MG 24 hr tablet TAKE 1 TABLET BY MOUTH EVERY DAY 90 tablet 1  . levocetirizine (XYZAL) 5 MG tablet Take 1 tablet (5 mg total) by mouth every evening.    . metFORMIN (GLUCOPHAGE) 1000 MG tablet Take 1,000 mg by mouth 2 (two) times daily with a meal.    . montelukast (SINGULAIR) 10 MG tablet Take 1 tablet (10 mg total) by mouth at bedtime. 90 tablet 3  . Multiple Vitamin (MULTI-VITAMINS) TABS Take 1 tablet by mouth daily.    . nitroGLYCERIN (NITROSTAT) 0.4 MG SL tablet Place 1 tablet (0.4 mg total) under the tongue every 5 (five) minutes x 3 doses as needed. For chest pain.  Call 911 if no relief after 3 tablets 30 tablet 11  . omega-3 acid ethyl esters (LOVAZA) 1 g capsule Take 2 capsules (2 g total) by mouth 2 (two) times daily. 360 capsule 3  . omeprazole (PRILOSEC) 40 MG capsule Take 40 mg by mouth 2 (two) times daily.     . pravastatin (PRAVACHOL) 40 MG tablet Take 1 tablet (40 mg total) by mouth daily. 90 tablet 3  . probenecid (BENEMID) 500 MG tablet Take 500 mg by mouth 2 (two)  times daily.   0  . vitamin C (ASCORBIC ACID) 500 MG tablet Take 500 mg by mouth daily.     . Vitamin E 400 units TABS Take 400 Units by mouth 2 (two) times daily.    .Marland Kitchenamoxicillin-clavulanate (AUGMENTIN) 875-125 MG tablet Take 1 tablet by mouth 2 (two) times daily. With food (Patient not taking: Reported on 06/14/2018) 14 tablet 0  . Cholecalciferol 50000 units TABS Take 1 tablet by mouth once a week. (Patient not taking: Reported on 06/14/2018) 13 tablet 1  . cholestyramine light (PREVALITE) 4 g packet Take 4 g by mouth daily with lunch.     . TRESIBA FLEXTOUCH 200 UNIT/ML SOPN Inject 90 Units into the skin at bedtime.  11   No facility-administered medications prior to visit.     Review of Systems  Constitutional: Negative for chills, fever, malaise/fatigue and weight loss.  HENT: Negative for congestion, nosebleeds, sinus pain and sore throat.   Eyes: Negative for photophobia, pain and discharge.  Respiratory: Positive for cough, sputum production and shortness of breath. Negative for hemoptysis and wheezing.   Cardiovascular: Negative for chest pain, palpitations, orthopnea and leg swelling.  Gastrointestinal: Positive for diarrhea and heartburn. Negative for abdominal pain, constipation, nausea and vomiting.  Genitourinary: Positive for frequency. Negative for dysuria, hematuria and urgency.  Musculoskeletal: Positive for back pain. Negative for joint pain, myalgias and neck pain.  Skin: Negative for itching and rash.  Neurological: Positive for weakness. Negative for tingling, tremors, sensory change, speech change, focal weakness, seizures and headaches.  Endo/Heme/Allergies: Positive for environmental allergies. Bruises/bleeds easily.  Psychiatric/Behavioral: Negative for memory loss, substance abuse and suicidal ideas. The patient is not nervous/anxious.       Objective:  Physical Exam   Vitals:   06/14/18 1557  BP: (!) 140/58  Pulse: 64  SpO2: 93%  Weight: 194 lb 6.4 oz  (88.2 kg)  Height: 5' 8"  (1.727 m)    Gen: chronically ill appearing, no acute distress HENT: NCAT, OP clear, neck supple without masses Eyes: PERRL, EOMi Lymph: no cervical lymphadenopathy PULM: Coarse crackles bases B CV: RRR, no mgr, no JVD GI: BS+, soft, nontender, no hsm Derm: no rash or skin breakdown MSK: normal bulk and tone Neuro: A&Ox4, CN II-XII intact, strength 5/5 in all 4 extremities Psyche: normal mood and affect   CBC    Component Value Date/Time   WBC 7.9 06/13/2018 1152   RBC 4.11 (L) 06/13/2018 1152   HGB 13.8 06/13/2018 1152   HCT 39.5 (L) 06/13/2018 1152   PLT 237 06/13/2018 1152   MCV 96.2 06/13/2018 1152   MCH 33.7 06/13/2018 1152   MCHC 35.0 06/13/2018 1152   RDW 14.7 (H) 06/13/2018 1152   LYMPHSABS 1.4 06/13/2018 1152   MONOABS 0.8 06/13/2018 1152   EOSABS 0.5 06/13/2018 1152   BASOSABS 0.1 06/13/2018 1152   BASOSABS 0 06/18/2014 1330     Chest imaging: June 13, 2018 CT angiogram chest images independently reviewed:  no pulmonary embolism, some air trapping noted, no infiltrate, I question whether or not there is mild bronchiectasis in the base of the left lung.  There is a calcified nodule seen in the left lower lobe these images were compared to multiple CT abdomen images with lung windows which I was able to review from this year.  PFT:  Labs:  Path:  Echo:  Heart Catheterization:       Assessment & Plan:   Shortness of breath - Plan: Fungus Culture with Smear,  MYCOBACTERIA, CULTURE, WITH FLUOROCHROME SMEAR, Respiratory or Resp and Sputum Culture, Pulmonary function test, Spirometry with Graph, Pulse oximetry, overnight, Ambulatory Referral for DME, CANCELED: Ambulatory Referral for DME  Sinusitis, unspecified chronicity, unspecified location  OSA (obstructive sleep apnea)  Other secondary pulmonary hypertension (HCC)  Diastolic congestive heart failure, unspecified HF chronicity ([place])  Bronchiectasis without complication  ([place])  Discussion: This is a pleasant 72 year old male who comes to my clinic today for evaluation of abnormal breathing, cough with mucus production and chronic sinus symptoms.  This is all in the background of obstructive sleep apnea which has not been treated, pulmonary hypertension, and significant cardiac disease.  He is profoundly deconditioned which clearly contributes to his dyspnea.  However, I question whether or not if his cardiac disease also contributes.  In regards to his pulmonary hypertension I think it is likely due to untreated sleep apnea but I question whether not left sided heart failure contributes as well.  Plan: Chronic sinus infections and daily mucus production: I will request records from [facility] ear nose and throat You need to start using saline rinses for your sinuses, I recommend NeilMed rinses Continue Singulair Continue Flonase Continue Zyrtec  Bronchiectasis left lower lobe and granulomatous changes seen on the CT  scan of your chest: I think this is scarring related to the pneumonia he had at age 71 Start using hypertonic saline twice a day Spirometry testing today Check oxygen level while walking today Please provide [place] with a sample of your mucus so that we can send it for testing for bacterial, fungal, AFB organisms  Dyspnea: Work-up as above  Pulmonary hypertension: We will check an overnight oxygen test, I think we need to revisit how we can treat the sleep apnea Check ABG  Sleep apnea: As above  We will see you back in 4 to 6 weeks to go over the results of the mucus culture  > 50% of this 60 minute visit spent face to face  Addendum: [facility] notes reviewed: he had a history of recurrent/chronic sinusitis with unremarkable imaging managed on astelin and flonase.      Current Outpatient Medications:  .  amLODipine (NORVASC) 5 MG tablet, Take 1 tablet (5 mg total) by mouth 2 (two) times daily. If blood pressure >140/>90 make take  another 1 pill of 5 mg, Disp: 180 tablet, Rfl: 3 .  aspirin EC 81 MG EC tablet, Take 1 tablet (81 mg total) by mouth daily., Disp: 30 tablet, Rfl: 0 .  carvedilol (COREG) 25 MG tablet, Take 1 tablet (25 mg total) by mouth 2 (two) times daily with a meal., Disp: 180 tablet, Rfl: 3 .  clopidogrel (PLAVIX) 75 MG tablet, Take 75 mg by mouth daily., Disp: , Rfl:  .  colchicine 0.6 MG tablet, TAKE 1 TABLET BY MOUTH ONCE DAILY, Disp: 60 tablet, Rfl: 0 .  diclofenac sodium (VOLTAREN) 1 % GEL, Apply 2 g topically 4 (four) times daily as needed (for pain). , Disp: , Rfl:  .  dicyclomine (BENTYL) 10 MG capsule, Take 10 mg by mouth 3 (three) times daily before meals. , Disp: , Rfl:  .  fluticasone (FLONASE) 50 MCG/ACT nasal spray, Place 2 sprays into both nostrils daily., Disp: 16 g, Rfl: 11 .  gabapentin (NEURONTIN) 300 MG capsule, take 2 capsules (630m) by mouth three times a day, Disp: 180 capsule, Rfl: 1 .  hydrALAZINE (APRESOLINE) 100 MG tablet, TAKE 100 MG BY MOUTH THREE TIMES A DAY, Disp: 270 tablet, Rfl: 1 .  HYDROcodone-acetaminophen (NORCO) 5-325 MG tablet, Take 1 tablet by mouth every 4 (four) hours as needed for severe pain (try not to take more than 2 a day.)., Disp: 6 tablet, Rfl: 0 .  insulin lispro (HUMALOG) 100 UNIT/ML KiwkPen, Inject 14-32 Units into the skin 3 (three) times daily. (according to sliding scale - max 96u over 24 hours), Disp: , Rfl:  .  ipratropium (ATROVENT) 0.06 % nasal spray, Place 2 sprays into both nostrils 4 (four) times daily., Disp: 15 mL, Rfl: 12 .  isosorbide mononitrate (IMDUR) 120 MG 24 hr tablet, TAKE 1 TABLET BY MOUTH EVERY DAY, Disp: 90 tablet, Rfl: 1 .  levocetirizine (XYZAL) 5 MG tablet, Take 1 tablet (5 mg total) by mouth every evening., Disp: , Rfl:  .  metFORMIN (GLUCOPHAGE) 1000 MG tablet, Take 1,000 mg by mouth 2 (two) times daily with a meal., Disp: , Rfl:  .  montelukast (SINGULAIR) 10 MG tablet, Take 1 tablet (10 mg total) by mouth at bedtime., Disp: 90  tablet, Rfl: 3 .  Multiple Vitamin (MULTI-VITAMINS) TABS, Take 1 tablet by mouth daily., Disp: , Rfl:  .  nitroGLYCERIN (NITROSTAT) 0.4 MG SL tablet, Place 1 tablet (0.4 mg total) under the tongue every 5 (five)  minutes x 3 doses as needed. For chest pain.  Call 911 if no relief after 3 tablets, Disp: 30 tablet, Rfl: 11 .  omega-3 acid ethyl esters (LOVAZA) 1 g capsule, Take 2 capsules (2 g total) by mouth 2 (two) times daily., Disp: 360 capsule, Rfl: 3 .  omeprazole (PRILOSEC) 40 MG capsule, Take 40 mg by mouth 2 (two) times daily. , Disp: , Rfl:  .  pravastatin (PRAVACHOL) 40 MG tablet, Take 1 tablet (40 mg total) by mouth daily., Disp: 90 tablet, Rfl: 3 .  probenecid (BENEMID) 500 MG tablet, Take 500 mg by mouth 2 (two) times daily. , Disp: , Rfl: 0 .  vitamin C (ASCORBIC ACID) 500 MG tablet, Take 500 mg by mouth daily. , Disp: , Rfl:  .  Vitamin E 400 units TABS, Take 400 Units by mouth 2 (two) times daily., Disp: , Rfl:  .  amoxicillin-clavulanate (AUGMENTIN) 875-125 MG tablet, Take 1 tablet by mouth 2 (two) times daily. With food (Patient not taking: Reported on 06/14/2018), Disp: 14 tablet, Rfl: 0 .  Cholecalciferol 50000 units TABS, Take 1 tablet by mouth once a week. (Patient not taking: Reported on 06/14/2018), Disp: 13 tablet, Rfl: 1 .  cholestyramine light (PREVALITE) 4 g packet, Take 4 g by mouth daily with lunch. , Disp: , Rfl:  .  sodium chloride HYPERTONIC 3 % nebulizer solution, Take by nebulization 2 (two) times daily., Disp: 300 mL, Rfl: 11 .  TRESIBA FLEXTOUCH 200 UNIT/ML SOPN, Inject 90 Units into the skin at bedtime., Disp: , Rfl: 11

## 2018-06-14 NOTE — Addendum Note (Signed)
Addended by: Joella Prince on: 06/14/2018 05:08 PM   Modules accepted: Orders

## 2018-06-15 ENCOUNTER — Telehealth: Payer: Self-pay | Admitting: Pulmonary Disease

## 2018-06-15 NOTE — Telephone Encounter (Signed)
Pt was seen by Dr. Lake Bells on 10.2.19 and was advised to produce sputum for labs. Pt lives in [place] and did not want to drive back to [place] to drop off the sputum cups. I spoke with Clinical Supervisor at our [place] location, [place], and was advised pt can drop off sputum cups at the [facility] lab. I called Dean, pt's son, and informed him of the location. Dean verbalized understanding and denied any further questions or concerns at this time.

## 2018-06-23 DIAGNOSIS — G4739 Other sleep apnea: Secondary | ICD-10-CM | POA: Diagnosis not present

## 2018-06-23 DIAGNOSIS — F0280 Dementia in other diseases classified elsewhere without behavioral disturbance: Secondary | ICD-10-CM | POA: Diagnosis not present

## 2018-06-23 DIAGNOSIS — G309 Alzheimer's disease, unspecified: Secondary | ICD-10-CM | POA: Diagnosis not present

## 2018-06-23 DIAGNOSIS — F0150 Vascular dementia without behavioral disturbance: Secondary | ICD-10-CM | POA: Diagnosis not present

## 2018-06-23 DIAGNOSIS — Z8673 Personal history of transient ischemic attack (TIA), and cerebral infarction without residual deficits: Secondary | ICD-10-CM | POA: Diagnosis not present

## 2018-06-30 DIAGNOSIS — R0902 Hypoxemia: Secondary | ICD-10-CM | POA: Diagnosis not present

## 2018-06-30 DIAGNOSIS — J449 Chronic obstructive pulmonary disease, unspecified: Secondary | ICD-10-CM | POA: Diagnosis not present

## 2018-06-30 DIAGNOSIS — I10 Essential (primary) hypertension: Secondary | ICD-10-CM | POA: Diagnosis not present

## 2018-07-04 ENCOUNTER — Ambulatory Visit: Payer: Medicare Other | Admitting: Internal Medicine

## 2018-07-11 ENCOUNTER — Other Ambulatory Visit: Payer: Self-pay | Admitting: Internal Medicine

## 2018-07-11 DIAGNOSIS — I10 Essential (primary) hypertension: Secondary | ICD-10-CM

## 2018-07-11 MED ORDER — HYDRALAZINE HCL 100 MG PO TABS: ORAL_TABLET | ORAL | 1 refills | Status: DC

## 2018-07-14 ENCOUNTER — Ambulatory Visit: Payer: Medicare Other | Admitting: Pulmonary Disease

## 2018-07-14 ENCOUNTER — Telehealth: Payer: Self-pay

## 2018-07-14 NOTE — Telephone Encounter (Signed)
-----   Message from Juanito Doom, MD sent at 07/14/2018  2:38 PM EDT ----- BJ, Please let the patient know the ONO was OK Thanks, B

## 2018-07-14 NOTE — Telephone Encounter (Signed)
Called and spoke with patients wife, she is aware of results and verbalized understanding. Nothing further needed.

## 2018-07-17 ENCOUNTER — Other Ambulatory Visit: Payer: Self-pay

## 2018-07-17 DIAGNOSIS — R1900 Intra-abdominal and pelvic swelling, mass and lump, unspecified site: Secondary | ICD-10-CM

## 2018-07-17 DIAGNOSIS — I701 Atherosclerosis of renal artery: Secondary | ICD-10-CM | POA: Diagnosis not present

## 2018-07-17 DIAGNOSIS — I2510 Atherosclerotic heart disease of native coronary artery without angina pectoris: Secondary | ICD-10-CM | POA: Diagnosis not present

## 2018-07-17 DIAGNOSIS — E782 Mixed hyperlipidemia: Secondary | ICD-10-CM | POA: Diagnosis not present

## 2018-07-17 DIAGNOSIS — I10 Essential (primary) hypertension: Secondary | ICD-10-CM | POA: Diagnosis not present

## 2018-07-17 DIAGNOSIS — I480 Paroxysmal atrial fibrillation: Secondary | ICD-10-CM | POA: Diagnosis not present

## 2018-07-17 DIAGNOSIS — E119 Type 2 diabetes mellitus without complications: Secondary | ICD-10-CM | POA: Diagnosis not present

## 2018-07-17 DIAGNOSIS — H524 Presbyopia: Secondary | ICD-10-CM | POA: Diagnosis not present

## 2018-07-17 DIAGNOSIS — G4733 Obstructive sleep apnea (adult) (pediatric): Secondary | ICD-10-CM | POA: Diagnosis not present

## 2018-07-17 DIAGNOSIS — S301XXA Contusion of abdominal wall, initial encounter: Secondary | ICD-10-CM

## 2018-07-17 DIAGNOSIS — H1851 Endothelial corneal dystrophy: Secondary | ICD-10-CM | POA: Diagnosis not present

## 2018-07-17 DIAGNOSIS — Z961 Presence of intraocular lens: Secondary | ICD-10-CM | POA: Diagnosis not present

## 2018-07-17 NOTE — Addendum Note (Signed)
Addended by: Lesly Rubenstein on: 07/17/2018 11:43 AM   Modules accepted: Orders

## 2018-07-18 ENCOUNTER — Ambulatory Visit: Payer: Medicare Other

## 2018-07-18 ENCOUNTER — Other Ambulatory Visit
Admission: RE | Admit: 2018-07-18 | Discharge: 2018-07-18 | Disposition: A | Discharge status: Home / Self Care | Payer: Medicare Other | Source: Ambulatory Visit | Attending: Pulmonary Disease | Admitting: Pulmonary Disease

## 2018-07-18 ENCOUNTER — Ambulatory Visit (INDEPENDENT_AMBULATORY_CARE_PROVIDER_SITE_OTHER): Payer: Medicare Other | Admitting: Pulmonary Disease

## 2018-07-18 ENCOUNTER — Telehealth: Payer: Self-pay | Admitting: *Deleted

## 2018-07-18 ENCOUNTER — Telehealth: Payer: Self-pay | Admitting: Pulmonary Disease

## 2018-07-18 ENCOUNTER — Encounter: Payer: Self-pay | Admitting: Pulmonary Disease

## 2018-07-18 VITALS — BP 124/58 | HR 53 | Ht 68.0 in | Wt 198.6 lb

## 2018-07-18 DIAGNOSIS — I639 Cerebral infarction, unspecified: Secondary | ICD-10-CM | POA: Diagnosis not present

## 2018-07-18 DIAGNOSIS — R0602 Shortness of breath: Secondary | ICD-10-CM | POA: Diagnosis present

## 2018-07-18 DIAGNOSIS — I5030 Unspecified diastolic (congestive) heart failure: Secondary | ICD-10-CM

## 2018-07-18 DIAGNOSIS — R197 Diarrhea, unspecified: Secondary | ICD-10-CM | POA: Diagnosis not present

## 2018-07-18 DIAGNOSIS — G4733 Obstructive sleep apnea (adult) (pediatric): Secondary | ICD-10-CM

## 2018-07-18 DIAGNOSIS — J479 Bronchiectasis, uncomplicated: Secondary | ICD-10-CM | POA: Diagnosis not present

## 2018-07-18 DIAGNOSIS — K219 Gastro-esophageal reflux disease without esophagitis: Secondary | ICD-10-CM | POA: Diagnosis not present

## 2018-07-18 DIAGNOSIS — Z23 Encounter for immunization: Secondary | ICD-10-CM | POA: Diagnosis not present

## 2018-07-18 LAB — BLOOD GAS, ARTERIAL
Acid-base deficit: 0.7 mmol/L (ref 0.0–2.0)
BICARBONATE: 23.4 mmol/L (ref 20.0–28.0)
FIO2: 0.21
O2 Saturation: 92.2 %
PH ART: 7.42 (ref 7.350–7.450)
Patient temperature: 37
pCO2 arterial: 36 mmHg (ref 32.0–48.0)
pO2, Arterial: 63 mmHg — ABNORMAL LOW (ref 83.0–108.0)

## 2018-07-18 NOTE — Telephone Encounter (Signed)
No calls made error on chart

## 2018-07-18 NOTE — Assessment & Plan Note (Signed)
ABG  >>>[facility]  >>>follow our [facility]   Sputum Culture samples  >>>can bring to [place]   Continue Singulair Continue Zyrtec Continue Flonase  Bronchiectasis: This is the medical term which indicates that you have damage, dilated airways making you more susceptible to respiratory infection. Hypertonic saline nebs as ordered  Let us know if you have cough with change in mucus color or fevers or chills.  At that point you would need an antibiotic. Maintain a healthy nutritious diet, eating whole foods Take your medications as prescribed   Follow-up in 6 to 8 weeks with Dr. Lake Bells  Flu vaccine

## 2018-07-18 NOTE — Assessment & Plan Note (Signed)
ABG  >>>[facility]  >>>follow our [facility]   Follow-up in 6 to 8 weeks with Dr. Lake Bells  Flu vaccine

## 2018-07-18 NOTE — Patient Instructions (Addendum)
ABG  >>>[facility]  >>>follow our [facility]   Sputum Culture samples  >>>can bring to [place]   Continue Singulair Continue Zyrtec And use Flonase   Bronchiectasis: This is the medical term which indicates that you have damage, dilated airways making you more susceptible to respiratory infection. Hypertonic saline nebs as ordered  Let us know if you have cough with change in mucus color or fevers or chills.  At that point you would need an antibiotic. Maintain a healthy nutritious diet, eating whole foods Take your medications as prescribed   Follow-up in 6 to 8 weeks with Dr. Lake Bells  Flu vaccine   November/2019 we will be moving! We will no longer be at our [place] location.  Be on the look out for a post card/mailer to let you know we have officially moved.  Our new address and phone number will be:  [address] [place], [place] [address] Telephone number: [phone]  It is flu season:   >>>Remember to be washing your hands regularly, using hand sanitizer, be careful to use around herself with has contact with people who are sick will increase her chances of getting sick yourself. >>> Best ways to protect herself from the flu: Receive the yearly flu vaccine, practice good hand hygiene washing with soap and also using hand sanitizer when available, eat a nutritious meals, get adequate rest, hydrate appropriately   Please contact the office if your symptoms worsen or you have concerns that you are not improving.   Thank you for choosing [facility] for your healthcare, and for allowing [place] to partner with you on your healthcare journey. I am thankful to be able to provide care to you today.   Wyn Quaker FNP-C

## 2018-07-18 NOTE — Progress Notes (Signed)
Reviewed, agree 

## 2018-07-18 NOTE — Progress Notes (Signed)
@Patient  ID: Chase Cabrera, male    DOB: [birth year], 72 y.o.   MRN: [id number]  Chief Complaint  Patient presents with  . Follow-up    Cough     Referring provider: McLean-Scocuzza, Olivia Mackie *  HPI:  72 year old male patient followed in our office for bronchiectasis, allergic rhinitis, chronic sinusitis, suspected obstructive sleep apnea  PMH: Hypertension, CAD, A. fib Smoker/ Smoking History: Former Smoker. 3 pack year.  Maintenance: Atrovent nebs Pt of: [place]   07/18/2018  - Visit   72 year old male patient presenting today for follow-up visit.  Patient was last seen October/2019.  Patient unfortunately forgot to get his ABG as well as sputum samples.  Patient presents today with spouse as well as son.  They report the patient symptoms have been actually better since last office visit.  Patient started Zyrtec, Flonase, Singulair.  They report that the cough is still there but has improved.  Patient reports that this cough happens all the time throughout the day.  Patient denies fevers, chills, increased fatigue.  Patient would like flu vaccine today.   Tests:  Sputum culture>>> ABG>>>  06/2018 >>> ONO per chart review was negative  June 13, 2018 CT angiogram chest images - reviewed:  no pulmonary embolism, some air trapping noted, no infiltrate, I question whether or not there is mild bronchiectasis in the base of the left lung.  There is a calcified nodule seen in the left lower lobe these images were compared to multiple CT abdomen images with lung windows which I was able to review from this year.  FENO:  No results found for: NITRICOXIDE  PFT: No flowsheet data found.  Imaging: No results found.   Chart Review:    Specialty Problems      Pulmonary Problems   Pulmonary nodule    Noted CXR 09/2015 calcified right lung  Noted CT chest 06/2015 left       OSA (obstructive sleep apnea)    Not using bipap       Sinusitis   Bronchiectasis without  complication (HCC)      Allergies  Allergen Reactions  . Allopurinol Diarrhea, Other (See Comments) and Nausea And Vomiting    Other Reaction: GI Upset  . Atenolol Other (See Comments)    Other Reaction: bradycardia  . Atorvastatin Other (See Comments)    Other Reaction: muscle aches  . Ramipril Rash  . Rosuvastatin Other (See Comments)    Muscle aches  . Simvastatin Other (See Comments)    Other Reaction: MUSCLE ACHES (Zocor)  . Valsartan Other (See Comments)    Other Reaction: facial swelling  . Cardizem [Diltiazem] Rash    Immunization History  Administered Date(s) Administered  . Influenza, High Dose Seasonal PF 08/09/2017, 07/18/2018  . Influenza-Unspecified 07/02/2013, 07/04/2014, 07/14/2016  . Pneumococcal Conjugate-13 10/03/2017  . Pneumococcal Polysaccharide-23 03/08/2014    Past Medical History:  Diagnosis Date  . Arthritis   . Atrial fibrillation ([place])   . CAD (coronary artery disease)    6 STENTS  . CHF (congestive heart failure) ([place])   . Chicken pox   . Colon polyp   . Corneal dystrophy    bilateral  . Crohn's disease ([place])   . Diabetes ([place]) 2002  . Dysrhythmia    A-FIB AND PAF  . GERD (gastroesophageal reflux disease)   . Gout   . History of kidney stones   . History of shingles   . Hyperlipemia   . Hypertension   . Kidney stones   .  Myocardial infarction ([place])    x4, last one in 2010  . Peripheral neuropathy   . Peripheral neuropathy   . Peripheral neuropathy   . PUD (peptic ulcer disease)   . Rectus sheath hematoma    02/18/18 8.9 x 4.5 cm then 02/19/18 7.9 x 4.5 cm   . Renal artery stenosis ([place])   . Renal artery stenosis ([place])   . Salzmann's nodular dystrophy 2012  . Sleep apnea   . Spinal stenosis     Tobacco History: Social History   Tobacco Use  Smoking Status Former Smoker  . Packs/day: 1.00  . Years: 3.00  . Pack years: 3.00  . Types: Cigarettes  . Last attempt to quit: 06/30/1962  . Years since quitting: 56.0    Smokeless Tobacco Former Systems developer  . Types: Chew  . Quit date: 12/05/1968   Counseling given: Yes  Continues to not smoke  Outpatient Encounter Medications as of 07/18/2018  Medication Sig  . amLODipine (NORVASC) 5 MG tablet Take 1 tablet (5 mg total) by mouth 2 (two) times daily. If blood pressure >140/>90 make take another 1 pill of 5 mg  . aspirin EC 81 MG EC tablet Take 1 tablet (81 mg total) by mouth daily.  . carvedilol (COREG) 25 MG tablet Take 1 tablet (25 mg total) by mouth 2 (two) times daily with a meal.  . Cholecalciferol 50000 units TABS Take 1 tablet by mouth once a week.  . clopidogrel (PLAVIX) 75 MG tablet Take 75 mg by mouth daily.  . colchicine 0.6 MG tablet TAKE 1 TABLET BY MOUTH ONCE DAILY  . diclofenac sodium (VOLTAREN) 1 % GEL Apply 2 g topically 4 (four) times daily as needed (for pain).   Marland Kitchen dicyclomine (BENTYL) 10 MG capsule Take 10 mg by mouth 3 (three) times daily before meals.   . fluticasone (FLONASE) 50 MCG/ACT nasal spray Place 2 sprays into both nostrils daily.  Marland Kitchen gabapentin (NEURONTIN) 300 MG capsule take 2 capsules (687m) by mouth three times a day  . hydrALAZINE (APRESOLINE) 100 MG tablet TAKE 100 MG BY MOUTH THREE TIMES A DAY  . HYDROcodone-acetaminophen (NORCO) 5-325 MG tablet Take 1 tablet by mouth every 4 (four) hours as needed for severe pain (try not to take more than 2 a day.).  .Marland Kitcheninsulin lispro (HUMALOG) 100 UNIT/ML KiwkPen Inject 14-32 Units into the skin 3 (three) times daily. (according to sliding scale - max 96u over 24 hours)  . ipratropium (ATROVENT) 0.06 % nasal spray Place 2 sprays into both nostrils 4 (four) times daily.  . isosorbide mononitrate (IMDUR) 120 MG 24 hr tablet TAKE 1 TABLET BY MOUTH EVERY DAY  . levocetirizine (XYZAL) 5 MG tablet Take 1 tablet (5 mg total) by mouth every evening.  . metFORMIN (GLUCOPHAGE) 1000 MG tablet Take 1,000 mg by mouth 2 (two) times daily with a meal.  . montelukast (SINGULAIR) 10 MG tablet Take 1 tablet  (10 mg total) by mouth at bedtime.  . Multiple Vitamin (MULTI-VITAMINS) TABS Take 1 tablet by mouth daily.  . nitroGLYCERIN (NITROSTAT) 0.4 MG SL tablet Place 1 tablet (0.4 mg total) under the tongue every 5 (five) minutes x 3 doses as needed. For chest pain.  Call 911 if no relief after 3 tablets  . omega-3 acid ethyl esters (LOVAZA) 1 g capsule Take 2 capsules (2 g total) by mouth 2 (two) times daily.  .Marland Kitchenomeprazole (PRILOSEC) 40 MG capsule Take 40 mg by mouth 2 (two) times daily.   .Marland Kitchen  pravastatin (PRAVACHOL) 40 MG tablet Take 1 tablet (40 mg total) by mouth daily.  . probenecid (BENEMID) 500 MG tablet Take 500 mg by mouth 2 (two) times daily.   . sodium chloride HYPERTONIC 3 % nebulizer solution Take by nebulization 2 (two) times daily.  . TRESIBA FLEXTOUCH 200 UNIT/ML SOPN Inject 90 Units into the skin at bedtime.  . vitamin C (ASCORBIC ACID) 500 MG tablet Take 500 mg by mouth daily.   . Vitamin E 400 units TABS Take 400 Units by mouth 2 (two) times daily.  . [DISCONTINUED] amoxicillin-clavulanate (AUGMENTIN) 875-125 MG tablet Take 1 tablet by mouth 2 (two) times daily. With food (Patient not taking: Reported on 07/18/2018)  . [DISCONTINUED] cholestyramine light (PREVALITE) 4 g packet Take 4 g by mouth daily with lunch.    No facility-administered encounter medications on file as of 07/18/2018.     Review of Systems  Review of Systems  Constitutional: Negative for activity change, chills, fatigue, fever and unexpected weight change.  HENT: Positive for congestion (Improved recently), postnasal drip and sinus pressure (Chronic, baseline frontal pressure). Negative for rhinorrhea, sinus pain, sneezing and sore throat.   Eyes: Negative.   Respiratory: Positive for cough (Light green to yellow sputum, this is baseline) and shortness of breath (Occasionally with exertion). Negative for wheezing.   Cardiovascular: Negative for chest pain and palpitations.  Gastrointestinal: Negative for  constipation, diarrhea, nausea and vomiting.  Endocrine: Negative.   Musculoskeletal: Negative.   Skin: Negative.   Neurological: Negative for dizziness and headaches.  Psychiatric/Behavioral: Negative.  Negative for dysphoric mood. The patient is not nervous/anxious.   All other systems reviewed and are negative.    Physical Exam  BP (!) 124/58 (BP Location: Left Arm, Cuff Size: Normal)   Pulse (!) 53   Ht 5' 8"  (1.727 m)   Wt 198 lb 9.6 oz (90.1 kg)   SpO2 98%   BMI 30.20 kg/m   Wt Readings from Last 5 Encounters:  07/18/18 198 lb 9.6 oz (90.1 kg)  06/14/18 194 lb 6.4 oz (88.2 kg)  06/13/18 193 lb (87.5 kg)  05/22/18 193 lb (87.5 kg)  05/12/18 195 lb (88.5 kg)    Physical Exam  Constitutional: He is oriented to person, place, and time and well-developed, well-nourished, and in no distress. No distress.  HENT:  Head: Normocephalic and atraumatic.  Right Ear: Hearing, tympanic membrane, external ear and ear canal normal.  Left Ear: Hearing, tympanic membrane, external ear and ear canal normal.  Nose: Nose normal. Right sinus exhibits no maxillary sinus tenderness and no frontal sinus tenderness. Left sinus exhibits no maxillary sinus tenderness and no frontal sinus tenderness.  Mouth/Throat: Uvula is midline and oropharynx is clear and moist. No oropharyngeal exudate.  Eyes: Pupils are equal, round, and reactive to light.  Neck: Normal range of motion. Neck supple. No JVD present.  Cardiovascular: Normal rate, regular rhythm and normal heart sounds.  Pulmonary/Chest: Effort normal. No accessory muscle usage. No respiratory distress. He has no decreased breath sounds. He has no wheezes. He has no rhonchi. He has rales (bibasilar crackles, L>R).  Abdominal: Soft. Bowel sounds are normal. There is no tenderness.  Musculoskeletal: Normal range of motion. He exhibits no edema.  Lymphadenopathy:    He has no cervical adenopathy.  Neurological: He is alert and oriented to person,  place, and time. Gait normal.  Skin: Skin is warm and dry. He is not diaphoretic. No erythema.  Psychiatric: Mood, memory, affect and judgment normal.  Nursing note and vitals reviewed.   Lab Results:  CBC    Component Value Date/Time   WBC 7.9 06/13/2018 1152   RBC 4.11 (L) 06/13/2018 1152   HGB 13.8 06/13/2018 1152   HCT 39.5 (L) 06/13/2018 1152   PLT 237 06/13/2018 1152   MCV 96.2 06/13/2018 1152   MCH 33.7 06/13/2018 1152   MCHC 35.0 06/13/2018 1152   RDW 14.7 (H) 06/13/2018 1152   LYMPHSABS 1.4 06/13/2018 1152   MONOABS 0.8 06/13/2018 1152   EOSABS 0.5 06/13/2018 1152   BASOSABS 0.1 06/13/2018 1152   BASOSABS 0 06/18/2014 1330    BMET    Component Value Date/Time   NA 136 06/13/2018 1152   K 4.3 06/13/2018 1152   CL 100 06/13/2018 1152   CO2 25 06/13/2018 1152   GLUCOSE 150 (H) 06/13/2018 1152   BUN 16 06/13/2018 1152   CREATININE 0.82 06/13/2018 1152   CREATININE 0.95 10/03/2017 1656   CALCIUM 9.8 06/13/2018 1152   GFRNONAA >60 06/13/2018 1152   GFRNONAA >60 04/23/2013 1451   GFRAA >60 06/13/2018 1152   GFRAA >60 04/23/2013 1451    BNP No results found for: BNP  ProBNP No results found for: PROBNP   Assessment & Plan:   72 year old male patient complaining follow-up today.  Will get patient to complete ABG as well as sputum culture results as was ordered at last office visit.  We will bring patient back in 6 to 8 weeks to review the sputum culture results.  Flu vaccine for patient today.  OSA (obstructive sleep apnea) ABG  >>>[facility]  >>>follow our [facility]   Follow-up in 6 to 8 weeks with Dr. Lake Bells  Flu vaccine   Bronchiectasis without complication ([place]) ABG  >>>[facility]  >>>follow our [facility]   Sputum Culture samples  >>>can bring to [place]   Continue Singulair Continue Zyrtec Continue Flonase  Bronchiectasis: This is the medical term which indicates that you have damage, dilated airways  making you more susceptible to respiratory infection. Hypertonic saline nebs as ordered  Let us know if you have cough with change in mucus color or fevers or chills.  At that point you would need an antibiotic. Maintain a healthy nutritious diet, eating whole foods Take your medications as prescribed   Follow-up in 6 to 8 weeks with Dr. Lake Bells  Flu vaccine       Lauraine Rinne, NP 07/18/2018

## 2018-07-20 NOTE — Progress Notes (Signed)
No significant change from your blood gas from 5 months ago.  Slight dip in oxygenation as you are now on room air.  No changes to plan of care at this time.  We still are showing that we have not received sputum samples from you.  Have you completed this step yet?  Wyn Quaker FNP

## 2018-07-20 NOTE — Progress Notes (Signed)
FYI   Pt on nighttime O2. Slightly worsened P02 on room air compared from 5 months ago. If your okay I wont make any changes on plan of care. Pt still has not completed sputum cultures as instructed.   Aaron Edelman

## 2018-07-20 NOTE — Progress Notes (Signed)
Thank you for following up on this.  Chase Cabrera

## 2018-07-21 NOTE — Telephone Encounter (Signed)
Error

## 2018-07-27 DIAGNOSIS — I701 Atherosclerosis of renal artery: Secondary | ICD-10-CM | POA: Diagnosis not present

## 2018-08-08 ENCOUNTER — Ambulatory Visit (INDEPENDENT_AMBULATORY_CARE_PROVIDER_SITE_OTHER): Payer: Medicare Other | Admitting: Internal Medicine

## 2018-08-08 ENCOUNTER — Encounter: Payer: Self-pay | Admitting: Internal Medicine

## 2018-08-08 VITALS — BP 132/58 | HR 57 | Temp 97.8°F | Ht 68.0 in | Wt 199.8 lb

## 2018-08-08 DIAGNOSIS — J309 Allergic rhinitis, unspecified: Secondary | ICD-10-CM

## 2018-08-08 DIAGNOSIS — G4733 Obstructive sleep apnea (adult) (pediatric): Secondary | ICD-10-CM | POA: Diagnosis not present

## 2018-08-08 DIAGNOSIS — I701 Atherosclerosis of renal artery: Secondary | ICD-10-CM | POA: Diagnosis not present

## 2018-08-08 DIAGNOSIS — I10 Essential (primary) hypertension: Secondary | ICD-10-CM

## 2018-08-08 DIAGNOSIS — K5090 Crohn's disease, unspecified, without complications: Secondary | ICD-10-CM

## 2018-08-08 DIAGNOSIS — M1611 Unilateral primary osteoarthritis, right hip: Secondary | ICD-10-CM

## 2018-08-08 DIAGNOSIS — I150 Renovascular hypertension: Secondary | ICD-10-CM | POA: Diagnosis not present

## 2018-08-08 DIAGNOSIS — R197 Diarrhea, unspecified: Secondary | ICD-10-CM | POA: Diagnosis not present

## 2018-08-08 NOTE — Progress Notes (Signed)
Pre visit review using our clinic review tool, if applicable. No additional management support is needed unless otherwise documented below in the visit note. 

## 2018-08-08 NOTE — Patient Instructions (Signed)
Dr. Holley Raring kidney doctor   Renal Artery Stenosis Renal artery stenosis (RAS) is narrowing of the artery that carries blood to your kidneys. It can affect one or both kidneys. Your kidneys filter waste and extra fluid from your blood. You get rid of the waste and fluid when you urinate. Your kidneys also make an important chemical messenger (hormone) called renin. Renin helps regulate your blood pressure. The first sign of RAS may be high blood pressure. Over time, other symptoms can develop. What are the causes? Plaque buildup in your arteries (atherosclerosis) is the main cause of RAS. The plaques that cause this are made up of:  Fat.  Cholesterol.  Calcium.  Other substances.  As these substances build up in your renal artery, this slows the blood supply to your kidneys. The lack of blood and oxygen causes the signs and symptoms of RAS. A much less common cause of RAS is a disease called fibromuscular dysplasia. This disease causes abnormal cell growth that narrows the renal artery. It is not related to atherosclerosis. It occurs mostly in women who are 2-62 years old. It may be passed down through families. What increases the risk? You may be at risk for renal artery stenosis if you:  Are a man who is at least 74 years old.  Are a woman who is at least 74 years old.  Have high blood pressure.  Have high cholesterol.  Are a smoker.  Abuse alcohol.  Have diabetes or prediabetes.  Are overweight.  Have a family history of early heart disease.  What are the signs or symptoms? RAS usually develops slowly. You may not have any signs or symptoms at first. The earliest signs may be:  Developing high blood pressure.  A sudden increase in existing high blood pressure.  No longer responding to medicine that used to control your blood pressure.  Later signs and symptoms are due to kidney damage. They may include:  Fatigue.  Shortness of breath.  Swollen legs and  feet.  Dry skin.  Headaches.  Muscle cramps.  Loss of appetite.  Nausea or vomiting.  How is this diagnosed? Your health care provider may suspect RAS based on changes in your blood pressure and your risk factors. A physical exam will be done. Your health care provider may use a stethoscope to listen for a whooshing sound (bruit) that can occur where the renal artery is blocking blood flow. Several tests may be done to confirm a diagnosis of RAS. These may include:  Blood and urine tests to check your kidney function.  Imaging tests of your kidneys, such as: ? A test that involves using sound waves to create an image of your kidneys and the blood flow to your kidneys (ultrasound). ? A test in which dye is injected into one of your blood vessels so images can be taken as the dye flows through your renal arteries (angiogram). These tests can be done using X-rays, a CT scan (computed tomography angiogram, CTA), or a type of MRI (magnetic resonance angiogram, MRA).  How is this treated? Making lifestyle changes to reduce your risk factors is the first treatment option for early RAS. If the blood flow to one of your kidneys is cut by more than half, you may need medicine to:  Lower your blood pressure. This is the main medical treatment for RAS. You may need more than one type of medicine for this. The two types that work best for RAS are: ? ACE inhibitors. ?  Angiotensin receptor blockers.  Reduce fluid in the body (diuretics).  Lower your cholesterol (statins).  If medicine is not enough to control RAS, you may need surgery. This may involve:  Threading a tube with an inflatable balloon into the renal artery to force it open (angioplasty).  Removing plaque from inside the artery (endarterectomy).  Follow these instructions at home:  Take medicines only as directed by your health care provider.  Make any lifestyle changes recommended by your health care provider. This may  include: ? Working with a dietitian to maintain a heart-healthy diet. This type of diet is low in saturated fat, salt, and added sugar. ? Starting an exercise program as directed by your health care provider. ? Maintaining a healthy weight. ? Quitting smoking. ? Not abusing alcohol.  Keep all follow-up visits as directed by your health care provider. This is important. Contact a health care provider if:  Your symptoms of RAS are not getting better.  Your symptoms are changing or getting worse. Get help right away if:  You have very bad pain in your back or abdomen.  You have blood in your urine. This information is not intended to replace advice given to you by your health care provider. Make sure you discuss any questions you have with your health care provider. Document Released: 05/26/2005 Document Revised: 02/05/2016 Document Reviewed: 12/13/2013 Elsevier Interactive Patient Education  [employer].

## 2018-08-08 NOTE — Progress Notes (Signed)
Chief Complaint  Patient presents with  . Follow-up   F/u with wife  1. HTN 2/2 RAS renal US 06/2018 with >60% stenosis b/l renal arteries and he already had a right renal artery stent on norvasc 5 mg qd, coreg 25 mg bid, hydralazine 100 mg tid, imdur 120 mg qd, now on clonidine 0.2 bid  He saw [place] [facility]ology 07/27/18 who did not want to stent at this time and f/u in 3-6 months  2. IBS D GI [place] increased amitriptyline 25 to 50 mg qhs to help with diarrhea   3. OSA not on bipap unable to tolerate full face mask it feels like he is smothering and wife states pt sleeps with open mouth so note sure if he would tolerate oral mouth piece. Wife c/o daytime fatigue always   4. Allergic rhinitis with runny nose and post nasal drip on flonase, atrovent, xyzal, singulair  5. C/o b/l hip pain upcoming appt with Dr. Rudene Christians s/p right hip surgery     Review of Systems  Constitutional: Negative for weight loss.  HENT:       +runny nose    Eyes: Negative for blurred vision.  Respiratory: Negative for shortness of breath.   Cardiovascular: Negative for chest pain.  Gastrointestinal: Positive for diarrhea. Negative for abdominal pain.  Musculoskeletal: Positive for joint pain.  Skin: Negative for rash.  Neurological: Negative for headaches.       +daytime fatigue    Psychiatric/Behavioral: Negative for depression.   Past Medical History:  Diagnosis Date  . Arthritis   . Atrial fibrillation ([place])   . CAD (coronary artery disease)    6 STENTS  . CHF (congestive heart failure) ([place])   . Chicken pox   . Colon polyp   . Corneal dystrophy    bilateral  . Crohn's disease ([place])   . Diabetes ([place]) 2002  . Dysrhythmia    A-FIB AND PAF  . GERD (gastroesophageal reflux disease)   . Gout   . History of kidney stones   . History of shingles   . Hyperlipemia   . Hypertension   . Kidney stones   . Myocardial infarction ([place])    x4, last one in 2010  . Peripheral neuropathy   .  Peripheral neuropathy   . Peripheral neuropathy   . PUD (peptic ulcer disease)   . Rectus sheath hematoma    02/18/18 8.9 x 4.5 cm then 02/19/18 7.9 x 4.5 cm   . Renal artery stenosis ([place])   . Renal artery stenosis ([place])   . Salzmann's nodular dystrophy 2012  . Sleep apnea   . Spinal stenosis    Past Surgical History:  Procedure Laterality Date  . ABLATION  2012  . APPLICATION VERTERBRAL DEFECT PROSTHETIC  05/01/2013  . ARTHRODESIS ANTERIOR LUMBAR SPINE  05/01/2013  . BACK SURGERY     lumbar fusion  . CARDIAC CATHETERIZATION    . CARDIAC ELECTROPHYSIOLOGY STUDY AND ABLATION    . CARDIAC SURGERY    . CARDIOVERSION    . CHOLECYSTECTOMY    . COLONOSCOPY WITH PROPOFOL N/A 04/09/2016   Completed for chronic diarrhea.  Few scattered diverticuli.  No mucosal lesions.  Surgeon: Lollie Sails, MD;  Location: [facility] ENDOSCOPY;  Service: Endoscopy;  Laterality: N/A;  . CORNEAL EYE SURGERY Bilateral 07/28/11    09/22/2011  . CORONARY ANGIOPLASTY    . CORONARY ANGIOPLASTY WITH STENT PLACEMENT  03/28/2015   Distal 80% to normal Stent, Dilation Balloon  . CORONARY  ARTERY BYPASS GRAFT     4 VESSELS  . EYE SURGERY     bilateral cataract 2017 Dr. Megan Mans  . foot and ankle repair Right 2005  . FRACTURE SURGERY     ANKLE PLATE AND SCREWS  . GALLBLADER    . JOINT REPLACEMENT     left total hip 11/11/15  . JOINT REPLACEMENT     right total hip 12/2017 Dr. Rudene Christians   . KNEE ARTHROSCOPY Right   . LUMBAR SPINE FUSION ONE LEVEL  05/03/2013  . RENAL ARTERY STENT Right   . ROTATOR CUFF REPAIR Right 2006  . TONSILLECTOMY    . TOTAL HIP ARTHROPLASTY Left 11/11/2015   Procedure: TOTAL HIP ARTHROPLASTY ANTERIOR APPROACH;  Surgeon: Hessie Knows, MD;  Location: [facility] ORS;  Service: Orthopedics;  Laterality: Left;  . TOTAL HIP ARTHROPLASTY Right 12/13/2017   Procedure: TOTAL HIP ARTHROPLASTY ANTERIOR APPROACH;  Surgeon: Hessie Knows, MD;  Location: [facility] ORS;  Service: Orthopedics;  Laterality: Right;  .  TRANSCATH PLACEMENT INTRAVASCULAR STENT LEG  03/2015   Family History  Problem Relation Age of Onset  . CVA Father   . Stroke Father   . Lung cancer Mother   . Arthritis Mother   . Stomach cancer Sister   . Colon cancer Sister   . Diabetes Brother    Social History   Socioeconomic History  . Marital status: Married    Spouse name: Not on file  . Number of children: Not on file  . Years of education: Not on file  . Highest education level: Not on file  Occupational History  . Not on file  Social Needs  . Financial resource strain: Not very hard  . Food insecurity:    Worry: Never true    Inability: Never true  . Transportation needs:    Medical: No    Non-medical: No  Tobacco Use  . Smoking status: Former Smoker    Packs/day: 1.00    Years: 3.00    Pack years: 3.00    Types: Cigarettes    Last attempt to quit: 06/30/1962    Years since quitting: 56.1  . Smokeless tobacco: Former Systems developer    Types: [place] date: 12/05/1968  Substance and Sexual Activity  . Alcohol use: No  . Drug use: No  . Sexual activity: Not Currently  Lifestyle  . Physical activity:    Days per week: 2 days    Minutes per session: 30 min  . Stress: Not at all  Relationships  . Social connections:    Talks on phone: Patient refused    Gets together: Patient refused    Attends religious service: Patient refused    Active member of club or organization: Patient refused    Attends meetings of clubs or organizations: Patient refused    Relationship status: Patient refused  . Intimate partner violence:    Fear of current or ex partner: No    Emotionally abused: No    Physically abused: No    Forced sexual activity: No  Other Topics Concern  . Not on file  Social History Narrative   Married    Current Meds  Medication Sig  . amitriptyline (ELAVIL) 25 MG tablet TAKE 1 TABLET BY MOUTH EVERY DAY EVERY NIGHT  . amLODipine (NORVASC) 5 MG tablet Take 1 tablet (5 mg total) by mouth 2 (two)  times daily. If blood pressure >140/>90 make take another 1 pill of 5 mg  . aspirin EC  81 MG EC tablet Take 1 tablet (81 mg total) by mouth daily.  . carvedilol (COREG) 25 MG tablet Take 1 tablet (25 mg total) by mouth 2 (two) times daily with a meal.  . Cholecalciferol 50000 units TABS Take 1 tablet by mouth once a week.  . cloNIDine (CATAPRES) 0.1 MG tablet TAKE 1 TABLET (0.1 MG TOTAL) BY MOUTH 2 (TWO) TIMES DAILY  . clopidogrel (PLAVIX) 75 MG tablet Take 75 mg by mouth daily.  . colchicine 0.6 MG tablet TAKE 1 TABLET BY MOUTH ONCE DAILY  . dextromethorphan-guaiFENesin (MUCINEX DM) 30-600 MG 12hr tablet Take by mouth.  . diclofenac sodium (VOLTAREN) 1 % GEL Apply 2 g topically 4 (four) times daily as needed (for pain).   Marland Kitchen dicyclomine (BENTYL) 10 MG capsule Take 10 mg by mouth 3 (three) times daily before meals.   . fluticasone (FLONASE) 50 MCG/ACT nasal spray Place 2 sprays into both nostrils daily.  Marland Kitchen gabapentin (NEURONTIN) 300 MG capsule take 2 capsules (627m) by mouth three times a day  . hydrALAZINE (APRESOLINE) 100 MG tablet TAKE 100 MG BY MOUTH THREE TIMES A DAY  . insulin lispro (HUMALOG) 100 UNIT/ML KiwkPen Inject 14-32 Units into the skin 3 (three) times daily. (according to sliding scale - max 96u over 24 hours)  . ipratropium (ATROVENT) 0.06 % nasal spray Place 2 sprays into both nostrils 4 (four) times daily.  . isosorbide mononitrate (IMDUR) 120 MG 24 hr tablet TAKE 1 TABLET BY MOUTH EVERY DAY  . levocetirizine (XYZAL) 5 MG tablet Take 1 tablet (5 mg total) by mouth every evening.  . metFORMIN (GLUCOPHAGE) 1000 MG tablet Take 1,000 mg by mouth 2 (two) times daily with a meal.  . montelukast (SINGULAIR) 10 MG tablet Take 1 tablet (10 mg total) by mouth at bedtime.  . Multiple Vitamin (MULTI-VITAMINS) TABS Take 1 tablet by mouth daily.  . nitroGLYCERIN (NITROSTAT) 0.4 MG SL tablet Place 1 tablet (0.4 mg total) under the tongue every 5 (five) minutes x 3 doses as needed. For chest  pain.  Call 911 if no relief after 3 tablets  . omega-3 acid ethyl esters (LOVAZA) 1 g capsule Take 2 capsules (2 g total) by mouth 2 (two) times daily.  .Marland Kitchenomeprazole (PRILOSEC) 40 MG capsule Take 40 mg by mouth 2 (two) times daily.   . pravastatin (PRAVACHOL) 40 MG tablet Take 1 tablet (40 mg total) by mouth daily.  . probenecid (BENEMID) 500 MG tablet Take 500 mg by mouth 2 (two) times daily.   . sodium chloride HYPERTONIC 3 % nebulizer solution Take by nebulization 2 (two) times daily.  . TRESIBA FLEXTOUCH 200 UNIT/ML SOPN Inject 90 Units into the skin at bedtime.  . vitamin C (ASCORBIC ACID) 500 MG tablet Take 500 mg by mouth daily.   . Vitamin E 400 units TABS Take 400 Units by mouth 2 (two) times daily.   Allergies  Allergen Reactions  . Allopurinol Diarrhea, Other (See Comments) and Nausea And Vomiting    Other Reaction: GI Upset  . Atenolol Other (See Comments)    Other Reaction: bradycardia  . Atorvastatin Other (See Comments)    Other Reaction: muscle aches  . Ramipril Rash  . Rosuvastatin Other (See Comments)    Muscle aches  . Simvastatin Other (See Comments)    Other Reaction: MUSCLE ACHES (Zocor)  . Valsartan Other (See Comments)    Other Reaction: facial swelling  . Cardizem [Diltiazem] Rash   Recent Results (from the past 2160  hour(s))  POC Influenza A&B(BINAX/QUICKVUE)     Status: Normal   Collection Time: 05/22/18  3:54 PM  Result Value Ref Range   Influenza A, POC Negative Negative   Influenza B, POC Negative Negative  Protime-INR     Status: None   Collection Time: 06/13/18 11:52 AM  Result Value Ref Range   Prothrombin Time 13.7 11.4 - 15.2 seconds   INR 1.06     Comment: Performed at [facility], [address] [place], [place] [address]  APTT     Status: None   Collection Time: 06/13/18 11:52 AM  Result Value Ref Range   aPTT 32 24 - 36 seconds    Comment: Performed at [facility], [address] [place], [place] [address]   CBC     Status: Abnormal   Collection Time: 06/13/18 11:52 AM  Result Value Ref Range   WBC 7.9 3.8 - 10.6 K/uL   RBC 4.11 (L) 4.40 - 5.90 MIL/uL   Hemoglobin 13.8 13.0 - 18.0 g/dL   HCT 39.5 (L) 40.0 - 52.0 %   MCV 96.2 80.0 - 100.0 fL   MCH 33.7 26.0 - 34.0 pg   MCHC 35.0 32.0 - 36.0 g/dL   RDW 14.7 (H) 11.5 - 14.5 %   Platelets 237 150 - 440 K/uL    Comment: Performed at [facility], [address] [place], [place] [address]  Differential     Status: None   Collection Time: 06/13/18 11:52 AM  Result Value Ref Range   Neutrophils Relative % 64 %   Neutro Abs 5.1 1.4 - 6.5 K/uL   Lymphocytes Relative 18 %   Lymphs Abs 1.4 1.0 - 3.6 K/uL   Monocytes Relative 10 %   Monocytes Absolute 0.8 0.2 - 1.0 K/uL   Eosinophils Relative 7 %   Eosinophils Absolute 0.5 0 - 0.7 K/uL   Basophils Relative 1 %   Basophils Absolute 0.1 0 - 0.1 K/uL    Comment: Performed at [facility], [address] [place], [place] [address]  Comprehensive metabolic panel     Status: Abnormal   Collection Time: 06/13/18 11:52 AM  Result Value Ref Range   Sodium 136 135 - 145 mmol/L   Potassium 4.3 3.5 - 5.1 mmol/L   Chloride 100 98 - 111 mmol/L   CO2 25 22 - 32 mmol/L   Glucose, Bld 150 (H) 70 - 99 mg/dL   BUN 16 8 - 23 mg/dL   Creatinine, Ser 0.82 0.61 - 1.24 mg/dL   Calcium 9.8 8.9 - 10.3 mg/dL   Total Protein 8.3 (H) 6.5 - 8.1 g/dL   Albumin 4.3 3.5 - 5.0 g/dL   AST 34 15 - 41 U/L   ALT 33 0 - 44 U/L   Alkaline Phosphatase 86 38 - 126 U/L   Total Bilirubin 0.7 0.3 - 1.2 mg/dL   GFR calc non Af Amer >60 >60 mL/min   GFR calc Af Amer >60 >60 mL/min    Comment: (NOTE) The eGFR has been calculated using the CKD EPI equation. This calculation has not been validated in all clinical situations. eGFR's persistently <60 mL/min signify possible Chronic Kidney Disease.    Anion gap 11 5 - 15    Comment: Performed at [facility], [address] [place], [place]  [address]  Troponin I     Status: None   Collection Time: 06/13/18 11:52 AM  Result Value Ref Range   Troponin  I <0.03 <0.03 ng/mL    Comment: Performed at [facility], [address] [place], [place] [address]  Glucose, capillary     Status: Abnormal   Collection Time: 06/13/18 11:54 AM  Result Value Ref Range   Glucose-Capillary 141 (H) 70 - 99 mg/dL   Comment 1 Notify RN    Comment 2 Document in Chart   Urinalysis, Complete w Microscopic     Status: Abnormal   Collection Time: 06/13/18  3:51 PM  Result Value Ref Range   Color, Urine YELLOW (A) YELLOW   APPearance CLEAR (A) CLEAR   Specific Gravity, Urine 1.018 1.005 - 1.030   pH 5.0 5.0 - 8.0   Glucose, UA NEGATIVE NEGATIVE mg/dL   Hgb urine dipstick NEGATIVE NEGATIVE   Bilirubin Urine NEGATIVE NEGATIVE   Ketones, ur NEGATIVE NEGATIVE mg/dL   Protein, ur NEGATIVE NEGATIVE mg/dL   Nitrite NEGATIVE NEGATIVE   Leukocytes, UA NEGATIVE NEGATIVE   RBC / HPF 0-5 0 - 5 RBC/hpf   WBC, UA 0-5 0 - 5 WBC/hpf   Bacteria, UA NONE SEEN NONE SEEN   Squamous Epithelial / LPF NONE SEEN 0 - 5   Triple Phosphate Crystal PRESENT     Comment: Performed at [facility], [address] [place], [place] [address]  Blood gas, arterial     Status: Abnormal   Collection Time: 07/18/18  4:17 PM  Result Value Ref Range   FIO2 0.21    pH, Arterial 7.42 7.350 - 7.450   pCO2 arterial 36 32.0 - 48.0 mmHg   pO2, Arterial 63 (L) 83.0 - 108.0 mmHg   Bicarbonate 23.4 20.0 - 28.0 mmol/L   Acid-base deficit 0.7 0.0 - 2.0 mmol/L   O2 Saturation 92.2 %   Patient temperature 37.0    Collection site LEFT RADIAL    Sample type ARTERIAL DRAW    Allens test (pass/fail) PASS PASS    Comment: Performed at [facility], [address] [place],  [address]   Objective  Body mass index is 30.38 kg/m. Wt Readings from Last 3 Encounters:  08/08/18 199 lb 12.8 oz (90.6 kg)  07/18/18 198 lb 9.6 oz (90.1 kg)  06/14/18 194 lb 6.4  oz (88.2 kg)   Temp Readings from Last 3 Encounters:  08/08/18 97.8 F (36.6 C) (Oral)  05/22/18 98.2 F (36.8 C) (Oral)  05/12/18 97.9 F (36.6 C) (Oral)   BP Readings from Last 3 Encounters:  08/08/18 (!) 132/58  07/18/18 (!) 124/58  06/14/18 (!) 140/58   Pulse Readings from Last 3 Encounters:  08/08/18 (!) 57  07/18/18 (!) 53  06/14/18 64    Physical Exam  Constitutional: He is oriented to person, place, and time. Vital signs are normal. He appears well-developed and well-nourished. He is cooperative.  HENT:  Head: Normocephalic and atraumatic.  Mouth/Throat: Oropharynx is clear and moist and mucous membranes are normal.  Eyes: Pupils are equal, round, and reactive to light. Conjunctivae are normal.  Cardiovascular: Normal rate, regular rhythm and normal heart sounds.  Pulmonary/Chest: Effort normal and breath sounds normal.  Neurological: He is alert and oriented to person, place, and time.  Walking to walker today 2 wheels in front    Skin: Skin is warm, dry and intact.  Psychiatric: He has a normal mood and affect. His speech is normal and behavior is normal. Judgment and thought content normal. Cognition and memory are normal.  Nursing note and vitals reviewed.   Assessment  1. HTN 2/2 RAS [place] 06/2018 b/l RAS >60% stenosis. Controlled currently wife reports in evening BP will increase at home  2. IBS D vs chrons vs other  3. OSA not on bipap sleep study had 06/2016  4. Allergic rhinitis, PND 5. B/l hip pain s/p right hip surgery <1 year ago Dr. Rudene Christians  6. HM Plan   1.  norvasc 5 mg qd, coreg 25 mg bid, hydralazine 100 mg tid, imdur 120 mg qd, now on clonidine 0.2 bid  He saw [place] cardiology 07/27/18 who did not want to stent at this time and f/u in 3-6 months Refer to Dr. Holley Raring renal to weigh in  2. F/u [facility]  Laurine Blazer  Will ask if this patient will be candidate for creon vs lomotil ? Or if this can help. Cholestyramine did not help in past  3. Will disc  with Dr. Lake Bells about other mask options for bipap if any  4. Cont meds consider allergy testing in future (? If pulm can do) not sure it pt candidate for allergy shots with PND and allergic rhinitis on max therapy  5. F/u with Dr. Rudene Christians upcoming 6.  Flu shot utd  pna 23 had 02/2014 UTD prevnar Disc Tdap and shingrix in future   PSAhad nl 10/03/17 0.5 consider DRE in future [facility] on mesalamine 2.4 mg qd-negative colonoscopy with multiple bxs Consider repeat DEXA  -reviewed DEXA +osteoporosis noted 06/07/04 CT chest5/13/19 + lung nodules With h/o pulm nodules on CXR and CT chest10/11/16and former smoker [place] dermatologist [id number] c/w left temple and right hand Aks. -H/o skin cancer     Provider: Dr. Olivia Mackie McLean-Scocuzza-Internal Medicine

## 2018-08-09 ENCOUNTER — Encounter: Payer: Self-pay | Admitting: Internal Medicine

## 2018-08-09 DIAGNOSIS — R197 Diarrhea, unspecified: Secondary | ICD-10-CM | POA: Insufficient documentation

## 2018-08-14 DIAGNOSIS — Z96641 Presence of right artificial hip joint: Secondary | ICD-10-CM | POA: Diagnosis not present

## 2018-08-14 DIAGNOSIS — M9933 Osseous stenosis of neural canal of lumbar region: Secondary | ICD-10-CM | POA: Diagnosis not present

## 2018-08-14 DIAGNOSIS — M25551 Pain in right hip: Secondary | ICD-10-CM | POA: Diagnosis not present

## 2018-08-14 NOTE — Progress Notes (Signed)
Note has been sent

## 2018-08-22 ENCOUNTER — Ambulatory Visit
Admission: RE | Admit: 2018-08-22 | Discharge: 2018-08-22 | Disposition: A | Discharge status: Home / Self Care | Payer: Medicare Other | Source: Ambulatory Visit | Attending: General Surgery | Admitting: General Surgery

## 2018-08-22 ENCOUNTER — Telehealth: Payer: Self-pay | Admitting: General Surgery

## 2018-08-22 DIAGNOSIS — R1900 Intra-abdominal and pelvic swelling, mass and lump, unspecified site: Secondary | ICD-10-CM | POA: Insufficient documentation

## 2018-08-22 DIAGNOSIS — N400 Enlarged prostate without lower urinary tract symptoms: Secondary | ICD-10-CM | POA: Diagnosis not present

## 2018-08-22 DIAGNOSIS — K760 Fatty (change of) liver, not elsewhere classified: Secondary | ICD-10-CM | POA: Diagnosis not present

## 2018-08-22 LAB — POCT I-STAT CREATININE: CREATININE: 1.1 mg/dL (ref 0.61–1.24)

## 2018-08-22 MED ORDER — IOPAMIDOL (ISOVUE-300) INJECTION 61%
100.0000 mL | Freq: Once | INTRAVENOUS | Status: AC | PRN
  Administered 2018-08-22: 100 mL via INTRAVENOUS

## 2018-08-22 NOTE — Telephone Encounter (Signed)
Noted thank you for this info   Chase Cabrera

## 2018-08-22 NOTE — Telephone Encounter (Signed)
The patient was contacted regarding the abdominal pelvic CT completed earlier today and follow-up of an nodular density medial to the descending colon initially identified on a CT of the abdomen obtained in June 2019 when the patient presented to the ED with a rectus sheath hematoma secondary to anticoagulation. The area has increased in size by 2-3 mm. Films reviewed via phone w/ Dr. Pascal Lux from interventional radiology. Likely amenable to percutaneous biopsy.  Patient on Plavix, will need to clear with Rodman Key Roe,MD his cardiologist at [facility] that this can be held for five days pre-procedure.  Will fax information to Dr. Melven Sartorius for his review.  Patient amenable to proceed if cardiology clearance obtained.

## 2018-08-22 NOTE — Telephone Encounter (Signed)
The patient was contacted regarding the abdominal pelvic CT completed earlier today and follow-up of an nodular density medial to the descending colon initially identified on a CT of the abdomen obtained in June 2019 when the patient presented to the ED with a rectus sheath hematoma secondary to anticoagulation. The area has increased in size by 2-3 mm. Films reviewed via phone w/ Dr. Pascal Lux from interventional radiology. Likely amenable to percutaneous biopsy.  Patient on Plavix, will need to clear with Rodman Key Roe,MD his cardiologist at [facility] that this can be held for five days pre-procedure.  Will fax information to Dr. Melven Sartorius for his review.  Patient amenable to proceed if cardiology clearance obtained.

## 2018-08-31 ENCOUNTER — Ambulatory Visit (INDEPENDENT_AMBULATORY_CARE_PROVIDER_SITE_OTHER): Payer: Medicare Other | Admitting: General Surgery

## 2018-08-31 ENCOUNTER — Encounter: Payer: Self-pay | Admitting: General Surgery

## 2018-08-31 ENCOUNTER — Other Ambulatory Visit: Payer: Self-pay

## 2018-08-31 VITALS — BP 157/66 | HR 65 | Temp 97.7°F | Resp 16 | Ht 68.0 in | Wt 199.6 lb

## 2018-08-31 DIAGNOSIS — R1900 Intra-abdominal and pelvic swelling, mass and lump, unspecified site: Secondary | ICD-10-CM

## 2018-08-31 DIAGNOSIS — I639 Cerebral infarction, unspecified: Secondary | ICD-10-CM

## 2018-08-31 NOTE — Progress Notes (Signed)
Patient ID: Chase Cabrera, male   DOB: July 31, 1946, 72 y.o.   MRN: [id number]  Chief Complaint  Patient presents with  . Follow-up     6 mo f/u rec abdominal mass, CT abdomen/pelvis with contrast Selinda Flavin 08/22/18    HPI Chase Cabrera is a 72 y.o. male here today for 6 mo f/u rec abdominal mass, CT abdomen/pelvis with contrast Selinda Flavin 08/22/18. Patient has had a TIA and a hip replacement since last visit. Patient states he doing well otherwise he is here today with is wife. HPI  Past Medical History:  Diagnosis Date  . Arthritis   . Atrial fibrillation ([place])   . CAD (coronary artery disease)    6 STENTS  . CHF (congestive heart failure) ([place])   . Chicken pox   . Colon polyp   . Corneal dystrophy    bilateral  . Crohn's disease ([place])   . Diabetes ([place]) 2002  . Dysrhythmia    A-FIB AND PAF  . GERD (gastroesophageal reflux disease)   . Gout   . History of kidney stones   . History of shingles   . Hyperlipemia   . Hypertension    2/2 b/l RAS >60% [place] 06/2018 s/p right stent   . Kidney stones   . Myocardial infarction ([place])    x4, last one in 2010  . Peripheral neuropathy   . Peripheral neuropathy   . Peripheral neuropathy   . PUD (peptic ulcer disease)   . Rectus sheath hematoma    02/18/18 8.9 x 4.5 cm then 02/19/18 7.9 x 4.5 cm   . Renal artery stenosis ([place])   . Renal artery stenosis ([place])   . Salzmann's nodular dystrophy 2012  . Sleep apnea   . Spinal stenosis     Past Surgical History:  Procedure Laterality Date  . ABLATION  2012  . APPLICATION VERTERBRAL DEFECT PROSTHETIC  05/01/2013  . ARTHRODESIS ANTERIOR LUMBAR SPINE  05/01/2013  . BACK SURGERY     lumbar fusion  . CARDIAC CATHETERIZATION    . CARDIAC ELECTROPHYSIOLOGY STUDY AND ABLATION    . CARDIAC SURGERY    . CARDIOVERSION    . CHOLECYSTECTOMY    . COLONOSCOPY WITH PROPOFOL N/A 04/09/2016   Completed for chronic diarrhea.  Few scattered diverticuli.  No mucosal lesions.  Surgeon: Lollie Sails, MD;   Location: [facility] ENDOSCOPY;  Service: Endoscopy;  Laterality: N/A;  . CORNEAL EYE SURGERY Bilateral 07/28/11    09/22/2011  . CORONARY ANGIOPLASTY    . CORONARY ANGIOPLASTY WITH STENT PLACEMENT  03/28/2015   Distal 80% to normal Stent, Dilation Balloon  . CORONARY ARTERY BYPASS GRAFT     4 VESSELS  . EYE SURGERY     bilateral cataract 2017 Dr. Megan Mans  . foot and ankle repair Right 2005  . FRACTURE SURGERY     ANKLE PLATE AND SCREWS  . GALLBLADER    . JOINT REPLACEMENT     left total hip 11/11/15  . JOINT REPLACEMENT     right total hip 12/2017 Dr. Rudene Christians   . KNEE ARTHROSCOPY Right   . LUMBAR SPINE FUSION ONE LEVEL  05/03/2013  . RENAL ARTERY STENT Right   . ROTATOR CUFF REPAIR Right 2006  . TONSILLECTOMY    . TOTAL HIP ARTHROPLASTY Left 11/11/2015   Procedure: TOTAL HIP ARTHROPLASTY ANTERIOR APPROACH;  Surgeon: Hessie Knows, MD;  Location: [facility] ORS;  Service: Orthopedics;  Laterality: Left;  . TOTAL HIP ARTHROPLASTY Right 12/13/2017   Procedure:  TOTAL HIP ARTHROPLASTY ANTERIOR APPROACH;  Surgeon: Hessie Knows, MD;  Location: [facility] ORS;  Service: Orthopedics;  Laterality: Right;  . TRANSCATH PLACEMENT INTRAVASCULAR STENT LEG  03/2015    Family History  Problem Relation Age of Onset  . CVA Father   . Stroke Father   . Lung cancer Mother   . Arthritis Mother   . Stomach cancer Sister   . Colon cancer Sister   . Diabetes Brother     Social History Social History   Tobacco Use  . Smoking status: Former Smoker    Packs/day: 1.00    Years: 3.00    Pack years: 3.00    Types: Cigarettes    Last attempt to quit: 06/30/1962    Years since quitting: 56.2  . Smokeless tobacco: Former Systems developer    Types: Chew    Quit date: 12/05/1968  Substance Use Topics  . Alcohol use: No  . Drug use: No    Allergies  Allergen Reactions  . Allopurinol Diarrhea, Other (See Comments) and Nausea And Vomiting    Other Reaction: GI Upset  . Atenolol Other (See Comments)    Other Reaction: bradycardia   . Atorvastatin Other (See Comments)    Other Reaction: muscle aches  . Ramipril Rash  . Rosuvastatin Other (See Comments)    Muscle aches  . Simvastatin Other (See Comments)    Other Reaction: MUSCLE ACHES (Zocor)  . Valsartan Other (See Comments)    Other Reaction: facial swelling  . Cardizem [Diltiazem] Rash    Current Outpatient Medications  Medication Sig Dispense Refill  . amitriptyline (ELAVIL) 50 MG tablet Take 50 mg by mouth at bedtime as needed.   1  . amLODipine (NORVASC) 5 MG tablet Take 1 tablet (5 mg total) by mouth 2 (two) times daily. If blood pressure >140/>90 make take another 1 pill of 5 mg 180 tablet 3  . aspirin EC 81 MG EC tablet Take 1 tablet (81 mg total) by mouth daily. 30 tablet 0  . carvedilol (COREG) 25 MG tablet Take 1 tablet (25 mg total) by mouth 2 (two) times daily with a meal. 180 tablet 3  . Cholecalciferol 50000 units TABS Take 1 tablet by mouth once a week. 13 tablet 1  . cloNIDine (CATAPRES) 0.2 MG tablet Take by mouth 2 (two) times daily.   11  . clopidogrel (PLAVIX) 75 MG tablet Take 75 mg by mouth daily.    . colchicine 0.6 MG tablet TAKE 1 TABLET BY MOUTH ONCE DAILY 60 tablet 0  . dextromethorphan-guaiFENesin (MUCINEX DM) 30-600 MG 12hr tablet Take by mouth.    . dextromethorphan-guaiFENesin (MUCINEX DM) 30-600 MG 12hr tablet Take 1 tablet by mouth 2 (two) times daily.    . diclofenac sodium (VOLTAREN) 1 % GEL Apply 2 g topically 4 (four) times daily as needed (for pain).     Marland Kitchen dicyclomine (BENTYL) 10 MG capsule Take 10 mg by mouth 3 (three) times daily before meals.     . fluticasone (FLONASE) 50 MCG/ACT nasal spray Place 2 sprays into both nostrils daily. 16 g 11  . gabapentin (NEURONTIN) 300 MG capsule take 2 capsules (618m) by mouth three times a day 180 capsule 1  . hydrALAZINE (APRESOLINE) 100 MG tablet TAKE 100 MG BY MOUTH THREE TIMES A DAY 270 tablet 1  . insulin lispro (HUMALOG) 100 UNIT/ML KiwkPen Inject 14-32 Units into the skin 3  (three) times daily. (according to sliding scale - max 96u over 24 hours)    .  ipratropium (ATROVENT) 0.06 % nasal spray Place 2 sprays into both nostrils 4 (four) times daily. 15 mL 12  . isosorbide mononitrate (IMDUR) 120 MG 24 hr tablet TAKE 1 TABLET BY MOUTH EVERY DAY 90 tablet 1  . levocetirizine (XYZAL) 5 MG tablet Take 1 tablet (5 mg total) by mouth every evening.    . metFORMIN (GLUCOPHAGE) 1000 MG tablet Take 1,000 mg by mouth 2 (two) times daily with a meal.    . montelukast (SINGULAIR) 10 MG tablet Take 1 tablet (10 mg total) by mouth at bedtime. 90 tablet 3  . Multiple Vitamin (MULTI-VITAMINS) TABS Take 1 tablet by mouth daily.    . nitroGLYCERIN (NITROSTAT) 0.4 MG SL tablet Place 1 tablet (0.4 mg total) under the tongue every 5 (five) minutes x 3 doses as needed. For chest pain.  Call 911 if no relief after 3 tablets 30 tablet 11  . omega-3 acid ethyl esters (LOVAZA) 1 g capsule Take 2 capsules (2 g total) by mouth 2 (two) times daily. 360 capsule 3  . omeprazole (PRILOSEC) 40 MG capsule Take 40 mg by mouth 2 (two) times daily.     . pravastatin (PRAVACHOL) 40 MG tablet Take 1 tablet (40 mg total) by mouth daily. 90 tablet 3  . probenecid (BENEMID) 500 MG tablet Take 500 mg by mouth 2 (two) times daily.   0  . sodium chloride HYPERTONIC 3 % nebulizer solution Take by nebulization 2 (two) times daily. 300 mL 11  . TRESIBA FLEXTOUCH 200 UNIT/ML SOPN Inject 90 Units into the skin at bedtime.  11  . vitamin C (ASCORBIC ACID) 500 MG tablet Take 500 mg by mouth daily.     . Vitamin E 400 units TABS Take 400 Units by mouth 2 (two) times daily.     No current facility-administered medications for this visit.     Review of Systems Review of Systems  Constitutional: Negative.   Respiratory: Negative.   Cardiovascular: Negative.     Blood pressure (!) 157/66, pulse 65, temperature 97.7 F (36.5 C), temperature source Temporal, resp. rate 16, height 5' 8"  (1.727 m), weight 199 lb 9.6 oz  (90.5 kg), SpO2 93 %.  Physical Exam Physical Exam Constitutional:      Appearance: He is well-developed.  Eyes:     General: No scleral icterus.    Conjunctiva/sclera: Conjunctivae normal.  Cardiovascular:     Rate and Rhythm: Normal rate and regular rhythm.     Heart sounds: Normal heart sounds.  Pulmonary:     Effort: Pulmonary effort is normal.     Breath sounds: Normal breath sounds.  Lymphadenopathy:     Cervical: No cervical adenopathy.  Skin:    General: Skin is warm and dry.  Neurological:     Mental Status: He is alert and oriented to person, place, and time.     Data Reviewed A fax was sent to his cardiologist on December 10 before she discontinue oral Plavix long enough for the patient have a CT-guided biopsy.  No order at this time.  Assessment    Candidate for CT-guided biopsy of a left mesenteric mass, off Plavix.    Plan We will follow up with Dr.Roe's office.  If permission is obtained, would have the patient off Plavix for 1 week prior to the procedure.  The patient has been encouraged to follow-up with his orthopedist regarding the radicular pain in the right leg post hip replacement.  HPI, Physical Exam, Assessment and Plan have been  scribed under the direction and in the presence of Hervey Ard, Md.  Eudelia Bunch R. Bobette Mo, CMA  I have completed the exam and reviewed the above documentation for accuracy and completeness.  I agree with the above.  Haematologist has been used and any errors in dictation or transcription are unintentional.  Hervey Ard, M.D., F.A.C.S.  Forest Gleason Nadya Hopwood 09/01/2018, 7:08 AM

## 2018-08-31 NOTE — Patient Instructions (Addendum)
We will follow up with Dr.Roe's office.  Patient is to follow up as needed.  Call the office with any questions or concerns.

## 2018-09-07 ENCOUNTER — Encounter: Payer: Self-pay | Admitting: *Deleted

## 2018-09-07 NOTE — Progress Notes (Signed)
I called [facility] (phone: [phone]) again today since I never heard if they received the fax request for cardiology clearance.   Per Delana Meyer, patient is scheduled for an appointment on 09-18-2018 and there is a note that patient needs clearance.

## 2018-09-15 DIAGNOSIS — M48062 Spinal stenosis, lumbar region with neurogenic claudication: Secondary | ICD-10-CM | POA: Diagnosis not present

## 2018-09-15 DIAGNOSIS — M5136 Other intervertebral disc degeneration, lumbar region: Secondary | ICD-10-CM | POA: Diagnosis not present

## 2018-09-15 DIAGNOSIS — M5416 Radiculopathy, lumbar region: Secondary | ICD-10-CM | POA: Diagnosis not present

## 2018-09-16 ENCOUNTER — Inpatient Hospital Stay
Admit: 2018-09-16 | Discharge: 2018-09-16 | Disposition: A | Discharge status: Home / Self Care | Payer: Medicare Other | Attending: Internal Medicine | Admitting: Internal Medicine

## 2018-09-16 ENCOUNTER — Encounter
Admission: EM | Disposition: A | Discharge status: Home / Self Care | Payer: Self-pay | Source: Home / Self Care | Attending: Internal Medicine

## 2018-09-16 ENCOUNTER — Other Ambulatory Visit: Payer: Self-pay

## 2018-09-16 ENCOUNTER — Inpatient Hospital Stay
Admission: EM | Admit: 2018-09-16 | Discharge: 2018-09-17 | DRG: 247 | Disposition: A | Discharge status: Home / Self Care | Payer: Medicare Other | Attending: Internal Medicine | Admitting: Internal Medicine

## 2018-09-16 ENCOUNTER — Emergency Department: Payer: Medicare Other

## 2018-09-16 DIAGNOSIS — I2119 ST elevation (STEMI) myocardial infarction involving other coronary artery of inferior wall: Principal | ICD-10-CM | POA: Diagnosis present

## 2018-09-16 DIAGNOSIS — I2582 Chronic total occlusion of coronary artery: Secondary | ICD-10-CM | POA: Diagnosis present

## 2018-09-16 DIAGNOSIS — Z79899 Other long term (current) drug therapy: Secondary | ICD-10-CM

## 2018-09-16 DIAGNOSIS — R042 Hemoptysis: Secondary | ICD-10-CM | POA: Diagnosis not present

## 2018-09-16 DIAGNOSIS — I25810 Atherosclerosis of coronary artery bypass graft(s) without angina pectoris: Secondary | ICD-10-CM | POA: Diagnosis present

## 2018-09-16 DIAGNOSIS — E1165 Type 2 diabetes mellitus with hyperglycemia: Secondary | ICD-10-CM | POA: Diagnosis present

## 2018-09-16 DIAGNOSIS — I259 Chronic ischemic heart disease, unspecified: Secondary | ICD-10-CM

## 2018-09-16 DIAGNOSIS — I509 Heart failure, unspecified: Secondary | ICD-10-CM | POA: Diagnosis present

## 2018-09-16 DIAGNOSIS — E876 Hypokalemia: Secondary | ICD-10-CM | POA: Diagnosis present

## 2018-09-16 DIAGNOSIS — Z95820 Peripheral vascular angioplasty status with implants and grafts: Secondary | ICD-10-CM | POA: Diagnosis not present

## 2018-09-16 DIAGNOSIS — I110 Hypertensive heart disease with heart failure: Secondary | ICD-10-CM | POA: Diagnosis present

## 2018-09-16 DIAGNOSIS — Z800 Family history of malignant neoplasm of digestive organs: Secondary | ICD-10-CM

## 2018-09-16 DIAGNOSIS — Z8261 Family history of arthritis: Secondary | ICD-10-CM

## 2018-09-16 DIAGNOSIS — M109 Gout, unspecified: Secondary | ICD-10-CM | POA: Diagnosis present

## 2018-09-16 DIAGNOSIS — E785 Hyperlipidemia, unspecified: Secondary | ICD-10-CM | POA: Diagnosis not present

## 2018-09-16 DIAGNOSIS — Z955 Presence of coronary angioplasty implant and graft: Secondary | ICD-10-CM | POA: Diagnosis not present

## 2018-09-16 DIAGNOSIS — Z96643 Presence of artificial hip joint, bilateral: Secondary | ICD-10-CM | POA: Diagnosis present

## 2018-09-16 DIAGNOSIS — R0789 Other chest pain: Secondary | ICD-10-CM | POA: Diagnosis not present

## 2018-09-16 DIAGNOSIS — Z888 Allergy status to other drugs, medicaments and biological substances status: Secondary | ICD-10-CM

## 2018-09-16 DIAGNOSIS — Z7982 Long term (current) use of aspirin: Secondary | ICD-10-CM | POA: Diagnosis not present

## 2018-09-16 DIAGNOSIS — K219 Gastro-esophageal reflux disease without esophagitis: Secondary | ICD-10-CM | POA: Diagnosis present

## 2018-09-16 DIAGNOSIS — I480 Paroxysmal atrial fibrillation: Secondary | ICD-10-CM | POA: Diagnosis present

## 2018-09-16 DIAGNOSIS — I213 ST elevation (STEMI) myocardial infarction of unspecified site: Secondary | ICD-10-CM | POA: Diagnosis present

## 2018-09-16 DIAGNOSIS — E1142 Type 2 diabetes mellitus with diabetic polyneuropathy: Secondary | ICD-10-CM | POA: Diagnosis present

## 2018-09-16 DIAGNOSIS — Z833 Family history of diabetes mellitus: Secondary | ICD-10-CM

## 2018-09-16 DIAGNOSIS — R202 Paresthesia of skin: Secondary | ICD-10-CM | POA: Diagnosis not present

## 2018-09-16 DIAGNOSIS — I2109 ST elevation (STEMI) myocardial infarction involving other coronary artery of anterior wall: Secondary | ICD-10-CM | POA: Diagnosis not present

## 2018-09-16 DIAGNOSIS — Z951 Presence of aortocoronary bypass graft: Secondary | ICD-10-CM | POA: Diagnosis not present

## 2018-09-16 DIAGNOSIS — I2129 ST elevation (STEMI) myocardial infarction involving other sites: Secondary | ICD-10-CM | POA: Diagnosis not present

## 2018-09-16 DIAGNOSIS — I10 Essential (primary) hypertension: Secondary | ICD-10-CM | POA: Diagnosis not present

## 2018-09-16 DIAGNOSIS — K5090 Crohn's disease, unspecified, without complications: Secondary | ICD-10-CM | POA: Diagnosis present

## 2018-09-16 DIAGNOSIS — G4733 Obstructive sleep apnea (adult) (pediatric): Secondary | ICD-10-CM | POA: Diagnosis present

## 2018-09-16 DIAGNOSIS — Z794 Long term (current) use of insulin: Secondary | ICD-10-CM | POA: Diagnosis not present

## 2018-09-16 DIAGNOSIS — R079 Chest pain, unspecified: Secondary | ICD-10-CM | POA: Diagnosis not present

## 2018-09-16 DIAGNOSIS — R200 Anesthesia of skin: Secondary | ICD-10-CM

## 2018-09-16 DIAGNOSIS — I252 Old myocardial infarction: Secondary | ICD-10-CM | POA: Diagnosis not present

## 2018-09-16 DIAGNOSIS — Z981 Arthrodesis status: Secondary | ICD-10-CM

## 2018-09-16 DIAGNOSIS — Z801 Family history of malignant neoplasm of trachea, bronchus and lung: Secondary | ICD-10-CM

## 2018-09-16 DIAGNOSIS — Z823 Family history of stroke: Secondary | ICD-10-CM

## 2018-09-16 DIAGNOSIS — Z87891 Personal history of nicotine dependence: Secondary | ICD-10-CM

## 2018-09-16 DIAGNOSIS — I2510 Atherosclerotic heart disease of native coronary artery without angina pectoris: Secondary | ICD-10-CM | POA: Diagnosis present

## 2018-09-16 DIAGNOSIS — I499 Cardiac arrhythmia, unspecified: Secondary | ICD-10-CM | POA: Diagnosis not present

## 2018-09-16 HISTORY — PX: LEFT HEART CATH AND CORONARY ANGIOGRAPHY: CATH118249

## 2018-09-16 HISTORY — PX: CORONARY/GRAFT ACUTE MI REVASCULARIZATION: CATH118305

## 2018-09-16 LAB — POCT I-STAT, CHEM 8
BUN: 22 mg/dL (ref 8–23)
Calcium, Ion: 1.19 mmol/L (ref 1.15–1.40)
Chloride: 99 mmol/L (ref 98–111)
Creatinine, Ser: 1.1 mg/dL (ref 0.61–1.24)
Glucose, Bld: 298 mg/dL — ABNORMAL HIGH (ref 70–99)
HCT: 41 % (ref 39.0–52.0)
Hemoglobin: 13.9 g/dL (ref 13.0–17.0)
Potassium: 4.9 mmol/L (ref 3.5–5.1)
Sodium: 136 mmol/L (ref 135–145)
TCO2: 23 mmol/L (ref 22–32)

## 2018-09-16 LAB — LIPID PANEL
Cholesterol: 131 mg/dL (ref 0–200)
HDL: 57 mg/dL (ref >40)
LDL Cholesterol: 65 mg/dL (ref 0–99)
Total CHOL/HDL Ratio: 2.3 RATIO
Triglycerides: 45 mg/dL (ref <150)
VLDL: 9 mg/dL (ref 0–40)

## 2018-09-16 LAB — COMPREHENSIVE METABOLIC PANEL
ALT: 18 U/L (ref 0–44)
AST: 19 U/L (ref 15–41)
Albumin: 4.5 g/dL (ref 3.5–5.0)
Alkaline Phosphatase: 73 U/L (ref 38–126)
Anion gap: 6 (ref 5–15)
BUN: 14 mg/dL (ref 8–23)
CO2: 24 mmol/L (ref 22–32)
Calcium: 9.3 mg/dL (ref 8.9–10.3)
Chloride: 109 mmol/L (ref 98–111)
Creatinine, Ser: 0.44 mg/dL — ABNORMAL LOW (ref 0.61–1.24)
Glucose, Bld: 108 mg/dL — ABNORMAL HIGH (ref 70–99)
Potassium: 3.4 mmol/L — ABNORMAL LOW (ref 3.5–5.1)
Sodium: 139 mmol/L (ref 135–145)
Total Bilirubin: 0.2 mg/dL — ABNORMAL LOW (ref 0.3–1.2)
Total Protein: 7.5 g/dL (ref 6.5–8.1)

## 2018-09-16 LAB — CBC WITH DIFFERENTIAL/PLATELET
Abs Immature Granulocytes: 0.09 10*3/uL — ABNORMAL HIGH (ref 0.00–0.07)
BASOS ABS: 0 10*3/uL (ref 0.0–0.1)
Basophils Relative: 0 %
EOS ABS: 0 10*3/uL (ref 0.0–0.5)
Eosinophils Relative: 0 %
HEMATOCRIT: 41.3 % (ref 39.0–52.0)
Hemoglobin: 13.8 g/dL (ref 13.0–17.0)
Immature Granulocytes: 1 %
Lymphocytes Relative: 9 %
Lymphs Abs: 1.1 10*3/uL (ref 0.7–4.0)
MCH: 32.4 pg (ref 26.0–34.0)
MCHC: 33.4 g/dL (ref 30.0–36.0)
MCV: 96.9 fL (ref 80.0–100.0)
Monocytes Absolute: 0.6 10*3/uL (ref 0.1–1.0)
Monocytes Relative: 5 %
Neutro Abs: 10.4 10*3/uL — ABNORMAL HIGH (ref 1.7–7.7)
Neutrophils Relative %: 85 %
Platelets: 266 10*3/uL (ref 150–400)
RBC: 4.26 MIL/uL (ref 4.22–5.81)
RDW: 13.5 % (ref 11.5–15.5)
WBC: 12.2 10*3/uL — ABNORMAL HIGH (ref 4.0–10.5)
nRBC: 0 % (ref 0.0–0.2)

## 2018-09-16 LAB — ECHOCARDIOGRAM COMPLETE
Height: 68 in
WEIGHTICAEL: 3301.61 [oz_av]

## 2018-09-16 LAB — PROTIME-INR
INR: 1.09
Prothrombin Time: 14 seconds (ref 11.4–15.2)

## 2018-09-16 LAB — APTT: aPTT: 30 seconds (ref 24–36)

## 2018-09-16 LAB — TROPONIN I
Troponin I: <0.03 ng/mL (ref <0.03)
Troponin I: 0.38 ng/mL (ref <0.03)
Troponin I: 0.7 ng/mL (ref <0.03)

## 2018-09-16 LAB — POCT ACTIVATED CLOTTING TIME: Activated Clotting Time: 329 seconds

## 2018-09-16 LAB — GLUCOSE, CAPILLARY
GLUCOSE-CAPILLARY: 215 mg/dL — AB (ref 70–99)
GLUCOSE-CAPILLARY: 285 mg/dL — AB (ref 70–99)
Glucose-Capillary: 289 mg/dL — ABNORMAL HIGH (ref 70–99)
Glucose-Capillary: 241 mg/dL — ABNORMAL HIGH (ref 70–99)
Glucose-Capillary: 216 mg/dL — ABNORMAL HIGH (ref 70–99)

## 2018-09-16 LAB — MRSA PCR SCREENING: MRSA by PCR: NEGATIVE

## 2018-09-16 SURGERY — CORONARY/GRAFT ACUTE MI REVASCULARIZATION
Anesthesia: Moderate Sedation

## 2018-09-16 MED ORDER — IOPAMIDOL (ISOVUE-300) INJECTION 61%
INTRAVENOUS | Status: DC | PRN
  Administered 2018-09-16: 280 mL

## 2018-09-16 MED ORDER — ASPIRIN 81 MG PO CHEW: 81.0000 mg | CHEWABLE_TABLET | Freq: Every day | ORAL | Status: DC

## 2018-09-16 MED ORDER — HYDRALAZINE HCL 20 MG/ML IJ SOLN: 5.0000 mg | INTRAMUSCULAR | Status: DC | PRN

## 2018-09-16 MED ORDER — TICAGRELOR 90 MG PO TABS
ORAL_TABLET | ORAL | Status: DC | PRN
  Administered 2018-09-16: 180 mg via ORAL

## 2018-09-16 MED ORDER — TIROFIBAN (AGGRASTAT) BOLUS VIA INFUSION
INTRAVENOUS | Status: DC | PRN
  Administered 2018-09-16: 2262.5 ug via INTRAVENOUS

## 2018-09-16 MED ORDER — SODIUM CHLORIDE 0.9% FLUSH
3.0000 mL | Freq: Two times a day (BID) | INTRAVENOUS | Status: DC
  Administered 2018-09-16 – 2018-09-17 (×3): 3 mL via INTRAVENOUS

## 2018-09-16 MED ORDER — PANTOPRAZOLE SODIUM 40 MG PO TBEC
40.0000 mg | DELAYED_RELEASE_TABLET | Freq: Every day | ORAL | Status: DC
  Administered 2018-09-16 – 2018-09-17 (×2): 40 mg via ORAL
  Filled 2018-09-16 (×2): qty 1

## 2018-09-16 MED ORDER — METOPROLOL TARTRATE 5 MG/5ML IV SOLN
INTRAVENOUS | Status: DC | PRN
  Administered 2018-09-16: 5 mg via INTRAVENOUS

## 2018-09-16 MED ORDER — DICYCLOMINE HCL 10 MG PO CAPS
10.0000 mg | ORAL_CAPSULE | Freq: Three times a day (TID) | ORAL | Status: DC
  Administered 2018-09-16 – 2018-09-17 (×5): 10 mg via ORAL
  Filled 2018-09-16 (×6): qty 1

## 2018-09-16 MED ORDER — CLOPIDOGREL BISULFATE 75 MG PO TABS: 75.0000 mg | ORAL_TABLET | Freq: Every day | ORAL | Status: DC

## 2018-09-16 MED ORDER — HYDRALAZINE HCL 50 MG PO TABS
100.0000 mg | ORAL_TABLET | Freq: Three times a day (TID) | ORAL | Status: DC
  Administered 2018-09-16 – 2018-09-17 (×5): 100 mg via ORAL
  Filled 2018-09-16 (×5): qty 2

## 2018-09-16 MED ORDER — INSULIN ASPART 100 UNIT/ML ~~LOC~~ SOLN
0.0000 [IU] | Freq: Every day | SUBCUTANEOUS | Status: DC
  Administered 2018-09-16: 3 [IU] via SUBCUTANEOUS
  Filled 2018-09-16: qty 1

## 2018-09-16 MED ORDER — CARVEDILOL 25 MG PO TABS: 25.0000 mg | ORAL_TABLET | Freq: Two times a day (BID) | ORAL | Status: DC

## 2018-09-16 MED ORDER — HEPARIN SODIUM (PORCINE) 5000 UNIT/ML IJ SOLN: 4000.0000 [IU] | Freq: Once | INTRAMUSCULAR | Status: DC

## 2018-09-16 MED ORDER — NITROGLYCERIN IN D5W 200-5 MCG/ML-% IV SOLN
0.0000 ug/min | INTRAVENOUS | Status: DC
  Administered 2018-09-16: 5 ug/min via INTRAVENOUS

## 2018-09-16 MED ORDER — POTASSIUM CHLORIDE 20 MEQ PO PACK: 40.0000 meq | PACK | Freq: Once | ORAL | Status: DC

## 2018-09-16 MED ORDER — SODIUM CHLORIDE 0.9 % IV SOLN
INTRAVENOUS | Status: AC | PRN
  Administered 2018-09-16: 1.75 mg/kg/h via INTRAVENOUS

## 2018-09-16 MED ORDER — ISOSORBIDE MONONITRATE ER 60 MG PO TB24
120.0000 mg | ORAL_TABLET | Freq: Every day | ORAL | Status: DC
  Administered 2018-09-16 – 2018-09-17 (×2): 120 mg via ORAL
  Filled 2018-09-16 (×2): qty 2

## 2018-09-16 MED ORDER — INSULIN GLARGINE 100 UNIT/ML ~~LOC~~ SOLN
25.0000 [IU] | Freq: Every day | SUBCUTANEOUS | Status: DC
  Administered 2018-09-16: 25 [IU] via SUBCUTANEOUS
  Filled 2018-09-16 (×3): qty 0.25

## 2018-09-16 MED ORDER — SODIUM CHLORIDE 0.9 % IV SOLN: 250.0000 mL | INTRAVENOUS | Status: DC | PRN

## 2018-09-16 MED ORDER — INSULIN ASPART 100 UNIT/ML ~~LOC~~ SOLN
0.0000 [IU] | Freq: Three times a day (TID) | SUBCUTANEOUS | Status: DC
  Administered 2018-09-16 (×3): 3 [IU] via SUBCUTANEOUS
  Administered 2018-09-17: 5 [IU] via SUBCUTANEOUS
  Administered 2018-09-17: 2 [IU] via SUBCUTANEOUS
  Filled 2018-09-16 (×5): qty 1

## 2018-09-16 MED ORDER — GABAPENTIN 300 MG PO CAPS
600.0000 mg | ORAL_CAPSULE | Freq: Three times a day (TID) | ORAL | Status: DC
  Administered 2018-09-16 – 2018-09-17 (×4): 600 mg via ORAL
  Filled 2018-09-16 (×4): qty 2

## 2018-09-16 MED ORDER — PRAVASTATIN SODIUM 40 MG PO TABS
40.0000 mg | ORAL_TABLET | Freq: Every day | ORAL | Status: DC
  Administered 2018-09-16: 40 mg via ORAL
  Filled 2018-09-16: qty 1
  Filled 2018-09-16: qty 2

## 2018-09-16 MED ORDER — ONDANSETRON HCL 4 MG/2ML IJ SOLN: 4.0000 mg | Freq: Four times a day (QID) | INTRAMUSCULAR | Status: DC | PRN

## 2018-09-16 MED ORDER — TIROFIBAN HCL IN NACL 5-0.9 MG/100ML-% IV SOLN
0.1500 ug/kg/min | INTRAVENOUS | Status: AC
  Administered 2018-09-16 (×3): 0.15 ug/kg/min via INTRAVENOUS
  Filled 2018-09-16 (×3): qty 100

## 2018-09-16 MED ORDER — HEPARIN (PORCINE) IN NACL 1000-0.9 UT/500ML-% IV SOLN
INTRAVENOUS | Status: DC | PRN
  Administered 2018-09-16: 1000 mL

## 2018-09-16 MED ORDER — NITROGLYCERIN IN D5W 200-5 MCG/ML-% IV SOLN
INTRAVENOUS | Status: AC | PRN
  Administered 2018-09-16: 20 ug/min via INTRAVENOUS

## 2018-09-16 MED ORDER — BIVALIRUDIN TRIFLUOROACETATE 250 MG IV SOLR
INTRAVENOUS | Status: AC
  Filled 2018-09-16: qty 250

## 2018-09-16 MED ORDER — TICAGRELOR 90 MG PO TABS
ORAL_TABLET | ORAL | Status: AC
  Filled 2018-09-16: qty 2

## 2018-09-16 MED ORDER — ACETAMINOPHEN 325 MG PO TABS: 650.0000 mg | ORAL_TABLET | ORAL | Status: DC | PRN

## 2018-09-16 MED ORDER — NITROGLYCERIN 1 MG/10 ML FOR IR/CATH LAB
INTRA_ARTERIAL | Status: DC | PRN
  Administered 2018-09-16: 200 ug via INTRACORONARY
  Administered 2018-09-16: 200 ug via INTRAVENOUS
  Administered 2018-09-16: 300 mL via INTRA_ARTERIAL
  Administered 2018-09-16: 300 ug via INTRACORONARY

## 2018-09-16 MED ORDER — BIVALIRUDIN BOLUS VIA INFUSION - CUPID
INTRAVENOUS | Status: DC | PRN
  Administered 2018-09-16: 67.875 mg via INTRAVENOUS

## 2018-09-16 MED ORDER — NITROGLYCERIN 0.4 MG SL SUBL: 0.4000 mg | SUBLINGUAL_TABLET | SUBLINGUAL | Status: DC | PRN

## 2018-09-16 MED ORDER — MORPHINE SULFATE (PF) 2 MG/ML IV SOLN
2.0000 mg | INTRAVENOUS | Status: DC | PRN
  Filled 2018-09-16: qty 1

## 2018-09-16 MED ORDER — AMLODIPINE BESYLATE 5 MG PO TABS
5.0000 mg | ORAL_TABLET | Freq: Every day | ORAL | Status: DC
  Administered 2018-09-16 – 2018-09-17 (×2): 5 mg via ORAL
  Filled 2018-09-16 (×2): qty 1

## 2018-09-16 MED ORDER — PROBENECID 500 MG PO TABS
500.0000 mg | ORAL_TABLET | Freq: Two times a day (BID) | ORAL | Status: DC
  Administered 2018-09-16 – 2018-09-17 (×3): 500 mg via ORAL
  Filled 2018-09-16 (×6): qty 1

## 2018-09-16 MED ORDER — CARVEDILOL 25 MG PO TABS
25.0000 mg | ORAL_TABLET | Freq: Two times a day (BID) | ORAL | Status: DC
  Administered 2018-09-16 (×2): 25 mg via ORAL
  Filled 2018-09-16 (×2): qty 1

## 2018-09-16 MED ORDER — SODIUM CHLORIDE 0.9 % IV SOLN
INTRAVENOUS | Status: DC
  Administered 2018-09-16 (×2): via INTRAVENOUS

## 2018-09-16 MED ORDER — ISOSORBIDE MONONITRATE ER 30 MG PO TB24: 120.0000 mg | ORAL_TABLET | Freq: Every day | ORAL | Status: DC

## 2018-09-16 MED ORDER — TIROFIBAN HCL IN NACL 5-0.9 MG/100ML-% IV SOLN
INTRAVENOUS | Status: AC
  Filled 2018-09-16: qty 100

## 2018-09-16 MED ORDER — METOPROLOL TARTRATE 5 MG/5ML IV SOLN
INTRAVENOUS | Status: AC
  Filled 2018-09-16: qty 5

## 2018-09-16 MED ORDER — SODIUM CHLORIDE 0.9 % WEIGHT BASED INFUSION
1.0000 mL/kg/h | INTRAVENOUS | Status: DC
  Administered 2018-09-16: 1 mL/kg/h via INTRAVENOUS

## 2018-09-16 MED ORDER — SODIUM CHLORIDE 0.9 % IV SOLN: INTRAVENOUS | Status: DC

## 2018-09-16 MED ORDER — PRAVASTATIN SODIUM 20 MG PO TABS: 40.0000 mg | ORAL_TABLET | Freq: Every day | ORAL | Status: DC

## 2018-09-16 MED ORDER — MONTELUKAST SODIUM 10 MG PO TABS
10.0000 mg | ORAL_TABLET | Freq: Every day | ORAL | Status: DC
  Administered 2018-09-16: 10 mg via ORAL
  Filled 2018-09-16: qty 1

## 2018-09-16 MED ORDER — OMEGA-3-ACID ETHYL ESTERS 1 G PO CAPS
2.0000 g | ORAL_CAPSULE | Freq: Two times a day (BID) | ORAL | Status: DC
  Administered 2018-09-16 – 2018-09-17 (×3): 2 g via ORAL
  Filled 2018-09-16 (×3): qty 2

## 2018-09-16 MED ORDER — SENNOSIDES-DOCUSATE SODIUM 8.6-50 MG PO TABS: 1.0000 | ORAL_TABLET | Freq: Every evening | ORAL | Status: DC | PRN

## 2018-09-16 MED ORDER — ASPIRIN 81 MG PO CHEW
81.0000 mg | CHEWABLE_TABLET | Freq: Every day | ORAL | Status: DC
  Administered 2018-09-16 – 2018-09-17 (×2): 81 mg via ORAL
  Filled 2018-09-16 (×2): qty 1

## 2018-09-16 MED ORDER — TIROFIBAN HCL IN NACL 5-0.9 MG/100ML-% IV SOLN
INTRAVENOUS | Status: AC | PRN
  Administered 2018-09-16: 0.15 ug/kg/min via INTRAVENOUS

## 2018-09-16 MED ORDER — NITROGLYCERIN 5 MG/ML IV SOLN
INTRAVENOUS | Status: AC
  Filled 2018-09-16: qty 10

## 2018-09-16 MED ORDER — TICAGRELOR 90 MG PO TABS
90.0000 mg | ORAL_TABLET | Freq: Two times a day (BID) | ORAL | Status: DC
  Administered 2018-09-16 – 2018-09-17 (×2): 90 mg via ORAL
  Filled 2018-09-16 (×3): qty 1

## 2018-09-16 MED ORDER — SODIUM CHLORIDE 0.9% FLUSH: 3.0000 mL | INTRAVENOUS | Status: DC | PRN

## 2018-09-16 MED ORDER — CLONIDINE HCL 0.1 MG PO TABS
0.2000 mg | ORAL_TABLET | Freq: Two times a day (BID) | ORAL | Status: DC
  Administered 2018-09-16 – 2018-09-17 (×3): 0.2 mg via ORAL
  Filled 2018-09-16 (×3): qty 2

## 2018-09-16 MED ORDER — NITROGLYCERIN IN D5W 200-5 MCG/ML-% IV SOLN
INTRAVENOUS | Status: AC
  Filled 2018-09-16: qty 250

## 2018-09-16 MED ORDER — BISACODYL 5 MG PO TBEC: 5.0000 mg | DELAYED_RELEASE_TABLET | Freq: Every day | ORAL | Status: DC | PRN

## 2018-09-16 MED ORDER — MORPHINE SULFATE (PF) 4 MG/ML IV SOLN: 4.0000 mg | INTRAVENOUS | Status: DC | PRN

## 2018-09-16 SURGICAL SUPPLY — 19 items
BALLN TREK RX 2.25X15 (BALLOONS) ×3
BALLOON TREK RX 2.25X15 (BALLOONS) ×1 IMPLANT
CATH INFINITI 5 FR IM (CATHETERS) ×3 IMPLANT
CATH INFINITI 5FR ANG PIGTAIL (CATHETERS) ×3 IMPLANT
CATH INFINITI 5FR JL4 (CATHETERS) ×3 IMPLANT
CATH INFINITI 5FR JL5 (CATHETERS) ×3 IMPLANT
CATH INFINITI JR4 5F (CATHETERS) ×3 IMPLANT
CATH VISTA GUIDE 6FR JR4 (CATHETERS) ×3 IMPLANT
DEVICE CLOSURE MYNXGRIP 6/7F (Vascular Products) ×3 IMPLANT
DEVICE INFLAT 30 PLUS (MISCELLANEOUS) ×3 IMPLANT
DEVICE SPIDERFX EMB PROT 4MM (WIRE) ×3 IMPLANT
KIT MANI 3VAL PERCEP (MISCELLANEOUS) ×3 IMPLANT
NEEDLE PERC 18GX7CM (NEEDLE) ×3 IMPLANT
PACK CARDIAC CATH (CUSTOM PROCEDURE TRAY) ×3 IMPLANT
SHEATH AVANTI 6FR X 11CM (SHEATH) ×3 IMPLANT
STENT RESOLUTE ONYX 3.0X22 (Permanent Stent) ×3 IMPLANT
WIRE ASAHI PROWATER 180CM (WIRE) ×3 IMPLANT
WIRE EMERALD 3MM-J .035X260CM (WIRE) ×3 IMPLANT
WIRE GUIDERIGHT .035X150 (WIRE) ×3 IMPLANT

## 2018-09-16 NOTE — ED Notes (Signed)
Patient to Cath lab

## 2018-09-16 NOTE — H&P (Signed)
[facility] [place] NAME: Chase Cabrera    MR#:  [id number]  DATE OF BIRTH:  Sep 04, 1946  DATE OF ADMISSION:  09/16/2018  PRIMARY CARE PHYSICIAN: McLean-Scocuzza, Nino Glow, MD   REQUESTING/REFERRING PHYSICIAN: Hinda Kehr, MD  CHIEF COMPLAINT:   Chief Complaint  Patient presents with  . Code STEMI    HISTORY OF PRESENT ILLNESS:  Chase Cabrera  is a 73 y.o. male with a known history of severe multivessel CAD (s/p CABG x4 + multiple stents) p/w CP. Code STEMI, s/p cardiac catheterization (Dr. Saralyn Pilar), s/p DES ostium SVG to OM3. Pt states that he had a spinal infection on Friday (09/15/2018) morning. He states he went home and was feeling fine throughout the day. He states that late in the evening (he is unsure of the time), he states he developed diffuse chest pain, which he states was mild at first, but got progressively worse, and began to radiate to his L arm/hand. He endorses concomitant SOB/chest tightness, but denies N/V, palpitations, diaphoresis, LH/LOC. He states he called EMS and then took NTG x2 + ASA81 x4, w/ improvement in symptoms. EKG demonstrated ST elevations in II, III, aVF, V5-V6. Pt to ICU s/p cardiac cath; comfortable, well-appearing and in no acute distress at the time of my assessment.  PAST MEDICAL HISTORY:   Past Medical History:  Diagnosis Date  . Arthritis   . Atrial fibrillation ([place])   . CAD (coronary artery disease)    6 STENTS  . CHF (congestive heart failure) ([place])   . Chicken pox   . Colon polyp   . Corneal dystrophy    bilateral  . Crohn's disease ([place])   . Diabetes ([place]) 2002  . Dysrhythmia    A-FIB AND PAF  . GERD (gastroesophageal reflux disease)   . Gout   . History of kidney stones   . History of shingles   . Hyperlipemia   . Hypertension    2/2 b/l RAS >60% [place] 06/2018 s/p right stent   . Kidney stones   . Myocardial infarction ([place])    x4, last one in 2010  . Peripheral neuropathy    . Peripheral neuropathy   . Peripheral neuropathy   . PUD (peptic ulcer disease)   . Rectus sheath hematoma    02/18/18 8.9 x 4.5 cm then 02/19/18 7.9 x 4.5 cm   . Renal artery stenosis ([place])   . Renal artery stenosis ([place])   . Salzmann's nodular dystrophy 2012  . Sleep apnea   . Spinal stenosis     PAST SURGICAL HISTORY:   Past Surgical History:  Procedure Laterality Date  . ABLATION  2012  . APPLICATION VERTERBRAL DEFECT PROSTHETIC  05/01/2013  . ARTHRODESIS ANTERIOR LUMBAR SPINE  05/01/2013  . BACK SURGERY     lumbar fusion  . CARDIAC CATHETERIZATION    . CARDIAC ELECTROPHYSIOLOGY STUDY AND ABLATION    . CARDIAC SURGERY    . CARDIOVERSION    . CHOLECYSTECTOMY    . COLONOSCOPY WITH PROPOFOL N/A 04/09/2016   Completed for chronic diarrhea.  Few scattered diverticuli.  No mucosal lesions.  Surgeon: Lollie Sails, MD;  Location: [facility] ENDOSCOPY;  Service: Endoscopy;  Laterality: N/A;  . CORNEAL EYE SURGERY Bilateral 07/28/11    09/22/2011  . CORONARY ANGIOPLASTY    . CORONARY ANGIOPLASTY WITH STENT PLACEMENT  03/28/2015   Distal 80% to normal Stent, Dilation Balloon  . CORONARY ARTERY BYPASS GRAFT  4 VESSELS  . EYE SURGERY     bilateral cataract 2017 Dr. Megan Mans  . foot and ankle repair Right 2005  . FRACTURE SURGERY     ANKLE PLATE AND SCREWS  . GALLBLADER    . JOINT REPLACEMENT     left total hip 11/11/15  . JOINT REPLACEMENT     right total hip 12/2017 Dr. Rudene Christians   . KNEE ARTHROSCOPY Right   . LUMBAR SPINE FUSION ONE LEVEL  05/03/2013  . RENAL ARTERY STENT Right   . ROTATOR CUFF REPAIR Right 2006  . TONSILLECTOMY    . TOTAL HIP ARTHROPLASTY Left 11/11/2015   Procedure: TOTAL HIP ARTHROPLASTY ANTERIOR APPROACH;  Surgeon: Hessie Knows, MD;  Location: [facility] ORS;  Service: Orthopedics;  Laterality: Left;  . TOTAL HIP ARTHROPLASTY Right 12/13/2017   Procedure: TOTAL HIP ARTHROPLASTY ANTERIOR APPROACH;  Surgeon: Hessie Knows, MD;  Location: [facility] ORS;  Service:  Orthopedics;  Laterality: Right;  . TRANSCATH PLACEMENT INTRAVASCULAR STENT LEG  03/2015    SOCIAL HISTORY:   Social History   Tobacco Use  . Smoking status: Former Smoker    Packs/day: 1.00    Years: 3.00    Pack years: 3.00    Types: Cigarettes    Last attempt to quit: 06/30/1962    Years since quitting: 56.2  . Smokeless tobacco: Former Systems developer    Types: Chew    Quit date: 12/05/1968  Substance Use Topics  . Alcohol use: No    FAMILY HISTORY:   Family History  Problem Relation Age of Onset  . CVA Father   . Stroke Father   . Lung cancer Mother   . Arthritis Mother   . Stomach cancer Sister   . Colon cancer Sister   . Diabetes Brother     DRUG ALLERGIES:   Allergies  Allergen Reactions  . Allopurinol Diarrhea, Other (See Comments) and Nausea And Vomiting    Other Reaction: GI Upset  . Atenolol Other (See Comments)    Other Reaction: bradycardia  . Atorvastatin Other (See Comments)    Other Reaction: muscle aches  . Ramipril Rash  . Rosuvastatin Other (See Comments)    Muscle aches  . Simvastatin Other (See Comments)    Other Reaction: MUSCLE ACHES (Zocor)  . Valsartan Other (See Comments)    Other Reaction: facial swelling  . Cardizem [Diltiazem] Rash    REVIEW OF SYSTEMS:   Review of Systems  Constitutional: Negative for chills, diaphoresis, fever, malaise/fatigue and weight loss.  HENT: Negative for congestion, ear pain, hearing loss, nosebleeds, sinus pain, sore throat and tinnitus.   Eyes: Negative for blurred vision, double vision and photophobia.  Respiratory: Positive for shortness of breath. Negative for cough, hemoptysis, sputum production and wheezing.   Cardiovascular: Positive for chest pain. Negative for palpitations, orthopnea, claudication, leg swelling and PND.  Gastrointestinal: Negative for abdominal pain, blood in stool, constipation, diarrhea, heartburn, melena, nausea and vomiting.  Genitourinary: Negative for dysuria, flank pain,  frequency, hematuria and urgency.  Musculoskeletal: Negative for back pain, falls, joint pain, myalgias and neck pain.  Skin: Negative for itching and rash.  Neurological: Negative for dizziness, tingling, tremors, sensory change, speech change, focal weakness, seizures, loss of consciousness, weakness and headaches.  Psychiatric/Behavioral: Negative for depression and memory loss. The patient is not nervous/anxious and does not have insomnia.    MEDICATIONS AT HOME:   Prior to Admission medications   Medication Sig Start Date End Date Taking? Authorizing Provider  amitriptyline (ELAVIL) 50  MG tablet Take 50 mg by mouth at bedtime as needed.  07/14/18  Yes [provider]  amLODipine (NORVASC) 5 MG tablet Take 1 tablet (5 mg total) by mouth 2 (two) times daily. If blood pressure >140/>90 make take another 1 pill of 5 mg 05/01/18  Yes Mayo, Pete Pelt, MD  aspirin EC 81 MG EC tablet Take 1 tablet (81 mg total) by mouth daily. 05/02/18  Yes Mayo, Pete Pelt, MD  carvedilol (COREG) 25 MG tablet Take 1 tablet (25 mg total) by mouth 2 (two) times daily with a meal. 04/05/18  Yes McLean-Scocuzza, Nino Glow, MD  Cholecalciferol 50000 units TABS Take 1 tablet by mouth once a week. 10/07/17  Yes McLean-Scocuzza, Nino Glow, MD  cloNIDine (CATAPRES) 0.2 MG tablet Take by mouth 2 (two) times daily.  06/09/18  Yes [provider]  clopidogrel (PLAVIX) 75 MG tablet Take 75 mg by mouth daily.   Yes [provider]  colchicine 0.6 MG tablet TAKE 1 TABLET BY MOUTH ONCE DAILY 07/12/17  Yes Leone Haven, MD  diclofenac sodium (VOLTAREN) 1 % GEL Apply 2 g topically 4 (four) times daily as needed (for pain).    Yes [provider]  dicyclomine (BENTYL) 10 MG capsule Take 10 mg by mouth 3 (three) times daily before meals.    Yes [provider]  fluticasone (FLONASE) 50 MCG/ACT nasal spray Place 2 sprays into both nostrils daily. 05/22/18  Yes McLean-Scocuzza, Nino Glow, MD    gabapentin (NEURONTIN) 300 MG capsule take 2 capsules (640m) by mouth three times a day 01/16/18  Yes McLean-Scocuzza, TNino Glow MD  hydrALAZINE (APRESOLINE) 100 MG tablet TAKE 100 MG BY MOUTH THREE TIMES A DAY 07/11/18  Yes McLean-Scocuzza, TNino Glow MD  insulin lispro (HUMALOG) 100 UNIT/ML KiwkPen Inject 14-32 Units into the skin 3 (three) times daily. (according to sliding scale - max 96u over 24 hours)   Yes [provider]  ipratropium (ATROVENT) 0.06 % nasal spray Place 2 sprays into both nostrils 4 (four) times daily. 02/09/18  Yes McLean-Scocuzza, TNino Glow MD  isosorbide mononitrate (IMDUR) 120 MG 24 hr tablet TAKE 1 TABLET BY MOUTH EVERY DAY 10/07/17  Yes McLean-Scocuzza, TNino Glow MD  levocetirizine (XYZAL) 5 MG tablet Take 1 tablet (5 mg total) by mouth every evening. 02/09/18  Yes McLean-Scocuzza, TNino Glow MD  metFORMIN (GLUCOPHAGE) 1000 MG tablet Take 1,000 mg by mouth 2 (two) times daily with a meal.   Yes [provider]  montelukast (SINGULAIR) 10 MG tablet Take 1 tablet (10 mg total) by mouth at bedtime. 05/09/18  Yes McLean-Scocuzza, TNino Glow MD  Multiple Vitamin (MULTI-VITAMINS) TABS Take 1 tablet by mouth daily. 04/15/09  Yes [provider]  nitroGLYCERIN (NITROSTAT) 0.4 MG SL tablet Place 1 tablet (0.4 mg total) under the tongue every 5 (five) minutes x 3 doses as needed. For chest pain.  Call 911 if no relief after 3 tablets 10/03/17 11/02/21 Yes McLean-Scocuzza, TNino Glow MD  omega-3 acid ethyl esters (LOVAZA) 1 g capsule Take 2 capsules (2 g total) by mouth 2 (two) times daily. 05/12/18  Yes McLean-Scocuzza, TNino Glow MD  omeprazole (PRILOSEC) 40 MG capsule Take 40 mg by mouth 2 (two) times daily.    Yes [provider]  pravastatin (PRAVACHOL) 40 MG tablet Take 1 tablet (40 mg total) by mouth daily. 03/15/18  Yes McLean-Scocuzza, TNino Glow MD  probenecid (BENEMID) 500 MG tablet Take 500 mg by mouth 2 (two) times daily.  01/09/15  Yes [provider]   sodium chloride HYPERTONIC 3 % nebulizer solution Take by nebulization 2 (two) times daily. 06/14/18  Yes McQuaid, Ronie Spies, MD  TRESIBA FLEXTOUCH 200 UNIT/ML SOPN Inject 90 Units into the skin at bedtime. 04/25/18  Yes [provider]  vitamin C (ASCORBIC ACID) 500 MG tablet Take 500 mg by mouth daily.    Yes [provider]  Vitamin E 400 units TABS Take 400 Units by mouth 2 (two) times daily.   Yes [provider]  dextromethorphan-guaiFENesin (MUCINEX DM) 30-600 MG 12hr tablet Take by mouth.    [provider]  dextromethorphan-guaiFENesin (MUCINEX DM) 30-600 MG 12hr tablet Take 1 tablet by mouth 2 (two) times daily.    [provider]      VITAL SIGNS:  Blood pressure (!) 141/71, pulse 69, temperature 98.3 F (36.8 C), temperature source Oral, resp. rate (!) 26, height 5' 8"  (1.727 m), weight 93.6 kg, SpO2 94 %.  PHYSICAL EXAMINATION:  Physical Exam Constitutional:      General: He is awake. He is not in acute distress.    Appearance: Normal appearance. He is well-developed and overweight. He is not ill-appearing, toxic-appearing or diaphoretic.     Interventions: He is not intubated.Nasal cannula in place.  HENT:     Head: Normocephalic and atraumatic.     Mouth/Throat:     Mouth: Mucous membranes are moist.  Eyes:     General: Lids are normal. No scleral icterus.    Extraocular Movements: Extraocular movements intact.     Conjunctiva/sclera: Conjunctivae normal.  Neck:     Musculoskeletal: Neck supple.  Cardiovascular:     Rate and Rhythm: Normal rate and regular rhythm.     Heart sounds: Normal heart sounds, S1 normal and S2 normal. Heart sounds not distant. No murmur. No systolic murmur. No diastolic murmur. No friction rub. No gallop. No S3 or S4 sounds.   Pulmonary:     Effort: Pulmonary effort is normal. Tachypnea present. No bradypnea, accessory muscle usage, prolonged expiration, respiratory distress or retractions. He is  not intubated.     Breath sounds: Normal breath sounds and air entry. No stridor, decreased air movement or transmitted upper airway sounds. No decreased breath sounds, wheezing, rhonchi or rales.  Abdominal:     General: Bowel sounds are decreased. There is no distension.     Palpations: Abdomen is soft.     Tenderness: There is no abdominal tenderness. There is no guarding or rebound.  Musculoskeletal: Normal range of motion.        General: No swelling or tenderness.     Right lower leg: No edema.     Left lower leg: No edema.  Lymphadenopathy:     Cervical: No cervical adenopathy.  Skin:    General: Skin is warm and dry.     Findings: No erythema or rash.  Neurological:     General: No focal deficit present.     Mental Status: He is alert and oriented to person, place, and time. Mental status is at baseline.  Psychiatric:        Attention and Perception: Attention and perception normal.        Mood and Affect: Mood and affect normal.        Speech: Speech normal.        Behavior: Behavior normal. Behavior is cooperative.        Thought Content: Thought content normal.  Cognition and Memory: Cognition and memory normal.        Judgment: Judgment normal.    LABORATORY PANEL:   CBC Recent Labs  Lab 09/16/18 0124 09/16/18 0155  WBC 12.2*  --   HGB 13.8 13.9  HCT 41.3 41.0  PLT 266  --    ------------------------------------------------------------------------------------------------------------------  Chemistries  Recent Labs  Lab 09/16/18 0124 09/16/18 0155  NA 139 136  K 3.4* 4.9  CL 109 99  CO2 24  --   GLUCOSE 108* 298*  BUN 14 22  CREATININE 0.44* 1.10  CALCIUM 9.3  --   AST 19  --   ALT 18  --   ALKPHOS 73  --   BILITOT 0.2*  --    ------------------------------------------------------------------------------------------------------------------  Cardiac Enzymes Recent Labs  Lab 09/16/18 0124  TROPONINI <0.03    ------------------------------------------------------------------------------------------------------------------  RADIOLOGY:  Dg Chest Port 1 View  Result Date: 09/16/2018 CLINICAL DATA:  ST-elevation myocardial infarction. Acute onset of generalized chest pain. EXAM: PORTABLE CHEST 1 VIEW COMPARISON:  Chest radiograph and CTA of the chest performed 06/13/2018 FINDINGS: The lungs are well-aerated. Mild vascular congestion is noted. There is no evidence of focal opacification, pleural effusion or pneumothorax. The cardiomediastinal silhouette is borderline normal in size. The patient is status post median sternotomy. A metallic device is noted overlying the mediastinum. No acute osseous abnormalities are seen. External pacing pads are noted. The patient is status post right-sided rotator cuff repair. IMPRESSION: Mild vascular congestion noted; lungs remain grossly clear. Electronically Signed   By: Garald Balding M.D.   On: 09/16/2018 01:50   IMPRESSION AND PLAN:   A/P: 1F w/ PMHx severe multivessel CAD (s/p CABG x4 + multiple stents) p/w CP, Code STEMI, s/p cardiac catheterization (Dr. Saralyn Pilar), s/p DES ostium SVG to OM3. Hypokalemia, hyperglycemia (w/ T2IDDM). -CAD/STEMI: CP. Improved w/ NTG + ASA. EKG STE II, III, aVF, V5-V6. Trop-I initially (-). Code STEMI, cath by Dr. Saralyn Pilar, s/p DES ostium SVG to OM3. ASA, Aggrastat, Brilinta. c/w Coreg, Imdur, statin. Not on ACE-I/ARB. Cardiac monitor, pulse oximetry. Risk optimization. Echo pending. -Hypokalemia: K+ 3.4 on admission. Improved to 4.9. -Hyperglycemia, T2IDDM: SSI. Hold PO antihyperglycemics. -c/w home meds/formulary subs. -FEN/GI: Cardiac diabetic diet. -DVT PPx: Aggrastat, Brilinta. -Code status: Full code. -Disposition: Admission, > 2 midnights.   All the records are reviewed and case discussed with ED provider. Management plans discussed with the patient, family and they are in agreement.  CODE STATUS: Full code.  TOTAL  TIME TAKING CARE OF THIS PATIENT: 75 minutes.    Arta Silence M.D on 09/16/2018 at 4:55 AM  Between 7am to 6pm - Pager - [phone]  After 6pm go to [email or web address] - Proofreader  [facility]  Office  [phone]  CC: Primary care physician; McLean-Scocuzza, Nino Glow, MD   Note: This dictation was prepared with Dragon dictation along with smaller phrase technology. Any transcriptional errors that result from this process are unintentional.

## 2018-09-16 NOTE — ED Notes (Signed)
Previous of MIx4. Previous bypass. 139/p, R18 196/101 with EMS. 100 mg Fentanyl. Fluids en route. Took home Ntg x 2, did not relieve pain. Patient alert and oriented on arrival with EMS.

## 2018-09-16 NOTE — Progress Notes (Signed)
Pt back to bed, VSS, wife at bedside. Waiting on 2A bed to come available.

## 2018-09-16 NOTE — Progress Notes (Signed)
eLink Physician-Brief Progress Note Patient Name: Chase Cabrera DOB: October 19, 1945 MRN: [id number]   Date of Service  09/16/2018  HPI/Events of Note  73 yr old admitted for STEMI. Emergent LHC. PCI marginal. Hematoma on left.  Hx of DM on ssi. HTN stable. Notes, labs, meds reviewed. Camera: Resting stable VS. Putting pressure on groin for a while.   eICU Interventions  Continue care as per Cards.      Intervention Category Evaluation Type: New Patient Evaluation  Elmer Sow 09/16/2018, 4:09 AM

## 2018-09-16 NOTE — Progress Notes (Signed)
Patient ID: Chase Cabrera, male   DOB: 12-22-1945, 73 y.o.   MRN: [id number]  [facility] PROGRESS NOTE  Chase Cabrera:[id number] DOB: 15-Sep-1945 DOA: 09/16/2018 PCP: McLean-Scocuzza, Nino Glow, MD  HPI/Subjective: Patient feeling better now.  He states he always has a chest pain in his right chest.  Last night had chest pain.  Always has some shortness of breath.  Admitted for STEMI and had a urgent cardiac catheterization and stent placement.  Feeling better now.  Objective: Vitals:   09/16/18 1400 09/16/18 1500  BP: (!) 115/52 (!) 125/56  Pulse: (!) 56 (!) 57  Resp: (!) 28 (!) 27  Temp:    SpO2: 97% 93%    Filed Weights   09/16/18 0121 09/16/18 0345  Weight: 90.5 kg 93.6 kg    ROS: Review of Systems  Constitutional: Negative for chills and fever.  Eyes: Negative for blurred vision.  Respiratory: Positive for shortness of breath. Negative for cough.   Cardiovascular: Positive for chest pain.  Gastrointestinal: Negative for abdominal pain, constipation, diarrhea, nausea and vomiting.  Genitourinary: Negative for dysuria.  Musculoskeletal: Negative for joint pain.  Neurological: Negative for dizziness and headaches.   Exam: Physical Exam  Constitutional: He is oriented to person, place, and time.  HENT:  Nose: No mucosal edema.  Mouth/Throat: No oropharyngeal exudate or posterior oropharyngeal edema.  Eyes: Pupils are equal, round, and reactive to light. Conjunctivae, EOM and lids are normal.  Neck: No JVD present. Carotid bruit is not present. No edema present. No thyroid mass and no thyromegaly present.  Cardiovascular: S1 normal and S2 normal. Exam reveals no gallop.  No murmur heard. Pulses:      Dorsalis pedis pulses are 2+ on the right side and 2+ on the left side.  Some right-sided chest wall pain to palpation  Respiratory: No respiratory distress. He has decreased breath sounds in the right lower field and the left lower field. He has no wheezes. He  has no rhonchi. He has no rales.  GI: Soft. Bowel sounds are normal. There is no abdominal tenderness.  Musculoskeletal:     Right ankle: He exhibits no swelling.     Left ankle: He exhibits no swelling.  Lymphadenopathy:    He has no cervical adenopathy.  Neurological: He is alert and oriented to person, place, and time. No cranial nerve deficit.  Skin: Skin is warm. No rash noted. Nails show no clubbing.  Psychiatric: He has a normal mood and affect.      Data Reviewed: Basic Metabolic Panel: Recent Labs  Lab 09/16/18 0124 09/16/18 0155  NA 139 136  K 3.4* 4.9  CL 109 99  CO2 24  --   GLUCOSE 108* 298*  BUN 14 22  CREATININE 0.44* 1.10  CALCIUM 9.3  --    Liver Function Tests: Recent Labs  Lab 09/16/18 0124  AST 19  ALT 18  ALKPHOS 73  BILITOT 0.2*  PROT 7.5  ALBUMIN 4.5   CBC: Recent Labs  Lab 09/16/18 0124 09/16/18 0155  WBC 12.2*  --   NEUTROABS 10.4*  --   HGB 13.8 13.9  HCT 41.3 41.0  MCV 96.9  --   PLT 266  --    Cardiac Enzymes: Recent Labs  Lab 09/16/18 0124 09/16/18 0520 09/16/18 0850  TROPONINI <0.03 0.38* 0.70*    CBG: Recent Labs  Lab 09/16/18 0349 09/16/18 0749 09/16/18 1121 09/16/18 1601  GLUCAP 289* 215* 241* 216*    Recent Results (from  the past 240 hour(s))  MRSA PCR Screening     Status: None   Collection Time: 09/16/18  3:46 AM  Result Value Ref Range Status   MRSA by PCR NEGATIVE NEGATIVE Final    Comment:        The GeneXpert MRSA Assay (FDA approved for NASAL specimens only), is one component of a comprehensive MRSA colonization surveillance program. It is not intended to diagnose MRSA infection nor to guide or monitor treatment for MRSA infections. Performed at [facility], [address], [place], [place] [address]      Studies: Dg Chest [facility] 1 View  Result Date: 09/16/2018 CLINICAL DATA:  ST-elevation myocardial infarction. Acute onset of generalized chest pain. EXAM: PORTABLE CHEST 1  VIEW COMPARISON:  Chest radiograph and CTA of the chest performed 06/13/2018 FINDINGS: The lungs are well-aerated. Mild vascular congestion is noted. There is no evidence of focal opacification, pleural effusion or pneumothorax. The cardiomediastinal silhouette is borderline normal in size. The patient is status post median sternotomy. A metallic device is noted overlying the mediastinum. No acute osseous abnormalities are seen. External pacing pads are noted. The patient is status post right-sided rotator cuff repair. IMPRESSION: Mild vascular congestion noted; lungs remain grossly clear. Electronically Signed   By: Garald Balding M.D.   On: 09/16/2018 01:50    Scheduled Meds: . amLODipine  5 mg Oral Daily  . aspirin  81 mg Oral Daily  . carvedilol  25 mg Oral BID WC  . cloNIDine  0.2 mg Oral BID  . dicyclomine  10 mg Oral TID AC  . gabapentin  600 mg Oral TID  . hydrALAZINE  100 mg Oral TID PC  . insulin aspart  0-5 Units Subcutaneous QHS  . insulin aspart  0-9 Units Subcutaneous TID WC  . insulin glargine  25 Units Subcutaneous QHS  . isosorbide mononitrate  120 mg Oral Daily  . montelukast  10 mg Oral QHS  . omega-3 acid ethyl esters  2 g Oral BID  . pantoprazole  40 mg Oral Daily  . pravastatin  40 mg Oral q1800  . probenecid  500 mg Oral BID  . sodium chloride flush  3 mL Intravenous Q12H  . ticagrelor  90 mg Oral BID   Continuous Infusions: . sodium chloride    . nitroGLYCERIN Stopped (09/16/18 1026)  . tirofiban 0.15 mcg/kg/min (09/16/18 1300)    Assessment/Plan:  1. STEMI.  Patient brought to the cardiac Cath Lab for urgent stent placement by Dr. Lorinda Creed cardiology.  Patient on aspirin and Brilinta at this time. 2. Hypertension on Coreg, clonidine, Norvasc, hydralazine 3. Type 2 diabetes mellitus on Tresiba at home put on glargine insulin and sliding scale here. 4. Hyperlipidemia unspecified.  Patient has allergies to Crestor, atorvastatin and simvastatin.  Continue the  patient's pravastatin. 5. GERD on Protonix  Code Status:     Code Status Orders  (From admission, onward)         Start     Ordered   09/16/18 0345  Full code  Continuous     09/16/18 0344        Code Status History    Date Active Date Inactive Code Status Order ID Comments User Context   09/16/2018 0344 09/16/2018 0344 Full Code [id number]  Arta Silence, MD Inpatient   04/30/2018 1901 05/02/2018 2101 DNR [id number]  Loletha Grayer, MD ED   02/09/2018 2250 02/11/2018 2112 Full Code [id number]  Harrie Foreman, MD Inpatient   12/13/2017  1814 12/17/2017 1708 Full Code [id number]  Hessie Knows, MD Inpatient   11/17/2015 1047 11/18/2015 1859 Full Code [id number]  Henreitta Leber, MD Inpatient    Advance Directive Documentation     Most Recent Value  Type of Advance Directive  Healthcare Power of Attorney  Pre-existing out of facility DNR order (yellow form or pink MOST form)  -  "MOST" Form in Place?  -     Family Communication: Spoke with wife at the bedside Disposition Plan: Will need 2 nights in the hospital  Consultants:  Cardiology  Procedures:  Cardiac catheterization  Time spent: 35 minutes  [place]

## 2018-09-16 NOTE — ED Provider Notes (Signed)
[facility] Emergency Department Provider Note  ____________________________________________   First MD Initiated Contact with Patient 09/16/18 0124     (approximate)  I have reviewed the triage vital signs and the nursing notes.   HISTORY  Chief Complaint Code STEMI  Level 5 caveat:  history/ROS limited by acute/critical illness  HPI Chase Cabrera is a 73 y.o. male with extensive past medical and cardiac history including a quadruple bypass and multiple stent placements as well as ablation for atrial fibrillation.  He presents by EMS as a code STEMI.  He had acute onset of sharp central chest pain and pressure radiating to both sides and numbness and pain down his left arm.  The pain was severe and nothing made it better or worse until he took a full dose aspirin (325 mg) and 2 sublingual nitroglycerin.  His pain then improved.  However he said that this was the worst he has ever experienced and worse than prior heart attack so he called 911.  They provided fentanyl 100 mcg IV and some IV fluids and brought him as soon as possible to the ED.  He said he had some mild shortness of breath during the chest pain but otherwise denies all associated symptoms.  The paramedics activated code STEMI and Dr. Saralyn Pilar was present almost immediately after the patient's arrival to the emergency department.  Past Medical History:  Diagnosis Date  . Arthritis   . Atrial fibrillation ([place])   . CAD (coronary artery disease)    6 STENTS  . CHF (congestive heart failure) ([place])   . Chicken pox   . Colon polyp   . Corneal dystrophy    bilateral  . Crohn's disease ([place])   . Diabetes ([place]) 2002  . Dysrhythmia    A-FIB AND PAF  . GERD (gastroesophageal reflux disease)   . Gout   . History of kidney stones   . History of shingles   . Hyperlipemia   . Hypertension    2/2 b/l RAS >60% [place] 06/2018 s/p right stent   . Kidney stones   . Myocardial infarction ([place])    x4,  last one in 2010  . Peripheral neuropathy   . Peripheral neuropathy   . Peripheral neuropathy   . PUD (peptic ulcer disease)   . Rectus sheath hematoma    02/18/18 8.9 x 4.5 cm then 02/19/18 7.9 x 4.5 cm   . Renal artery stenosis ([place])   . Renal artery stenosis ([place])   . Salzmann's nodular dystrophy 2012  . Sleep apnea   . Spinal stenosis     Patient Active Problem List   Diagnosis Date Noted  . Diarrhea 08/09/2018  . Allergic rhinitis 08/08/2018  . Bronchiectasis without complication ([place]) 89/37/3428  . TIA (transient ischemic attack) 04/30/2018  . Abnormal thyroid function test 04/03/2018  . Leukocytosis 04/03/2018  . Hematoma of rectus sheath 02/21/2018  . Carotid artery stenosis 02/16/2018  . Altered mental status 02/09/2018  . Sinusitis 02/09/2018  . Hypokalemia 01/20/2018  . OSA (obstructive sleep apnea) 01/12/2018  . Osteoarthritis of right hip 12/13/2017  . Intraabdominal mass 11/23/2017  . Colonic mass 11/18/2017  . Crohn's disease ([place]) 10/20/2017  . Pulmonary nodule 10/07/2017  . History of skin cancer 10/07/2017  . Rib pain on right side 03/07/2017  . Postnasal drip 09/09/2016  . Osteoarthritis 04/08/2016  . Spinal stenosis, lumbar 03/25/2016  . DM type 2 with diabetic peripheral neuropathy ([place]) 07/15/2014  . Gout 05/04/2013  .  Renal artery stenosis ([place]) 07/01/2011  . Atrial fibrillation ([place]) 06/30/2011  . CAD (coronary artery disease) 06/30/2011  . Hyperlipidemia 06/30/2011  . Essential hypertension 06/30/2011    Past Surgical History:  Procedure Laterality Date  . ABLATION  2012  . APPLICATION VERTERBRAL DEFECT PROSTHETIC  05/01/2013  . ARTHRODESIS ANTERIOR LUMBAR SPINE  05/01/2013  . BACK SURGERY     lumbar fusion  . CARDIAC CATHETERIZATION    . CARDIAC ELECTROPHYSIOLOGY STUDY AND ABLATION    . CARDIAC SURGERY    . CARDIOVERSION    . CHOLECYSTECTOMY    . COLONOSCOPY WITH PROPOFOL N/A 04/09/2016   Completed for chronic diarrhea.  Few scattered  diverticuli.  No mucosal lesions.  Surgeon: Lollie Sails, MD;  Location: [facility] ENDOSCOPY;  Service: Endoscopy;  Laterality: N/A;  . CORNEAL EYE SURGERY Bilateral 07/28/11    09/22/2011  . CORONARY ANGIOPLASTY    . CORONARY ANGIOPLASTY WITH STENT PLACEMENT  03/28/2015   Distal 80% to normal Stent, Dilation Balloon  . CORONARY ARTERY BYPASS GRAFT     4 VESSELS  . EYE SURGERY     bilateral cataract 2017 Dr. Megan Mans  . foot and ankle repair Right 2005  . FRACTURE SURGERY     ANKLE PLATE AND SCREWS  . GALLBLADER    . JOINT REPLACEMENT     left total hip 11/11/15  . JOINT REPLACEMENT     right total hip 12/2017 Dr. Rudene Christians   . KNEE ARTHROSCOPY Right   . LUMBAR SPINE FUSION ONE LEVEL  05/03/2013  . RENAL ARTERY STENT Right   . ROTATOR CUFF REPAIR Right 2006  . TONSILLECTOMY    . TOTAL HIP ARTHROPLASTY Left 11/11/2015   Procedure: TOTAL HIP ARTHROPLASTY ANTERIOR APPROACH;  Surgeon: Hessie Knows, MD;  Location: [facility] ORS;  Service: Orthopedics;  Laterality: Left;  . TOTAL HIP ARTHROPLASTY Right 12/13/2017   Procedure: TOTAL HIP ARTHROPLASTY ANTERIOR APPROACH;  Surgeon: Hessie Knows, MD;  Location: [facility] ORS;  Service: Orthopedics;  Laterality: Right;  . TRANSCATH PLACEMENT INTRAVASCULAR STENT LEG  03/2015    Prior to Admission medications   Medication Sig Start Date End Date Taking? Authorizing Provider  amitriptyline (ELAVIL) 50 MG tablet Take 50 mg by mouth at bedtime as needed.  07/14/18   [provider]  amLODipine (NORVASC) 5 MG tablet Take 1 tablet (5 mg total) by mouth 2 (two) times daily. If blood pressure >140/>90 make take another 1 pill of 5 mg 05/01/18   Mayo, Pete Pelt, MD  aspirin EC 81 MG EC tablet Take 1 tablet (81 mg total) by mouth daily. 05/02/18   Mayo, Pete Pelt, MD  carvedilol (COREG) 25 MG tablet Take 1 tablet (25 mg total) by mouth 2 (two) times daily with a meal. 04/05/18   McLean-Scocuzza, Nino Glow, MD  Cholecalciferol 50000 units TABS Take 1 tablet by mouth once a  week. 10/07/17   McLean-Scocuzza, Nino Glow, MD  cloNIDine (CATAPRES) 0.2 MG tablet Take by mouth 2 (two) times daily.  06/09/18   [provider]  clopidogrel (PLAVIX) 75 MG tablet Take 75 mg by mouth daily.    [provider]  colchicine 0.6 MG tablet TAKE 1 TABLET BY MOUTH ONCE DAILY 07/12/17   Leone Haven, MD  dextromethorphan-guaiFENesin [facility] DM) 30-600 MG 12hr tablet Take by mouth.    [provider]  dextromethorphan-guaiFENesin (MUCINEX DM) 30-600 MG 12hr tablet Take 1 tablet by mouth 2 (two) times daily.    [provider]  diclofenac sodium (VOLTAREN) 1 % GEL Apply 2 g topically 4 (four) times daily as needed (for pain).     [provider]  dicyclomine (BENTYL) 10 MG capsule Take 10 mg by mouth 3 (three) times daily before meals.     [provider]  fluticasone (FLONASE) 50 MCG/ACT nasal spray Place 2 sprays into both nostrils daily. 05/22/18   McLean-Scocuzza, Nino Glow, MD  gabapentin (NEURONTIN) 300 MG capsule take 2 capsules (645m) by mouth three times a day 01/16/18   McLean-Scocuzza, TNino Glow MD  hydrALAZINE (APRESOLINE) 100 MG tablet TAKE 100 MG BY MOUTH THREE TIMES A DAY 07/11/18   McLean-Scocuzza, TNino Glow MD  insulin lispro (HUMALOG) 100 UNIT/ML KiwkPen Inject 14-32 Units into the skin 3 (three) times daily. (according to sliding scale - max 96u over 24 hours)    [provider]  ipratropium (ATROVENT) 0.06 % nasal spray Place 2 sprays into both nostrils 4 (four) times daily. 02/09/18   McLean-Scocuzza, TNino Glow MD  isosorbide mononitrate (IMDUR) 120 MG 24 hr tablet TAKE 1 TABLET BY MOUTH EVERY DAY 10/07/17   McLean-Scocuzza, TNino Glow MD  levocetirizine (XYZAL) 5 MG tablet Take 1 tablet (5 mg total) by mouth every evening. 02/09/18   McLean-Scocuzza, TNino Glow MD  metFORMIN (GLUCOPHAGE) 1000 MG tablet Take 1,000 mg by mouth 2 (two) times daily with a meal.    [provider]  montelukast (SINGULAIR) 10 MG  tablet Take 1 tablet (10 mg total) by mouth at bedtime. 05/09/18   McLean-Scocuzza, TNino Glow MD  Multiple Vitamin (MULTI-VITAMINS) TABS Take 1 tablet by mouth daily. 04/15/09   [provider]  nitroGLYCERIN (NITROSTAT) 0.4 MG SL tablet Place 1 tablet (0.4 mg total) under the tongue every 5 (five) minutes x 3 doses as needed. For chest pain.  Call 911 if no relief after 3 tablets 10/03/17 11/02/21  McLean-Scocuzza, TNino Glow MD  omega-3 acid ethyl esters (LOVAZA) 1 g capsule Take 2 capsules (2 g total) by mouth 2 (two) times daily. 05/12/18   McLean-Scocuzza, TNino Glow MD  omeprazole (PRILOSEC) 40 MG capsule Take 40 mg by mouth 2 (two) times daily.     [provider]  pravastatin (PRAVACHOL) 40 MG tablet Take 1 tablet (40 mg total) by mouth daily. 03/15/18   McLean-Scocuzza, TNino Glow MD  probenecid (BENEMID) 500 MG tablet Take 500 mg by mouth 2 (two) times daily.  01/09/15   [provider]  sodium chloride HYPERTONIC 3 % nebulizer solution Take by nebulization 2 (two) times daily. 06/14/18   MJuanito Doom MD  TRESIBA FLEXTOUCH 200 UNIT/ML SOPN Inject 90 Units into the skin at bedtime. 04/25/18   [provider]  vitamin C (ASCORBIC ACID) 500 MG tablet Take 500 mg by mouth daily.     [provider]  Vitamin E 400 units TABS Take 400 Units by mouth 2 (two) times daily.    [provider]    Allergies Allopurinol; Atenolol; Atorvastatin; Ramipril; Rosuvastatin; Simvastatin; Valsartan; and Cardizem [diltiazem]  Family History  Problem Relation Age of Onset  . CVA Father   . Stroke Father   . Lung cancer Mother   . Arthritis Mother   . Stomach cancer Sister   . Colon cancer Sister   . Diabetes Brother     Social History Social History   Tobacco Use  . Smoking status: Former Smoker    Packs/day: 1.00    Years: 3.00    Pack years: 3.00  Types: Cigarettes    Last attempt to quit: 06/30/1962    Years since quitting: 56.2  . Smokeless  tobacco: Former Systems developer    Types: Chew    Quit date: 12/05/1968  Substance Use Topics  . Alcohol use: No  . Drug use: No    Review of Systems Level 5 caveat:  history/ROS limited by acute/critical illness ____________________________________________   PHYSICAL EXAM:  VITAL SIGNS: ED Triage Vitals  Enc Vitals Group     BP 09/16/18 0128 (!) 197/94     Pulse Rate 09/16/18 0128 80     Resp 09/16/18 0128 20     Temp 09/16/18 0128 97.9 F (36.6 C)     Temp Source 09/16/18 0128 Oral     SpO2 09/16/18 0128 96 %     Weight 09/16/18 0121 90.5 kg (199 lb 8.3 oz)     Height 09/16/18 0121 1.727 m (5' 8" )     Head Circumference --      Peak Flow --      Pain Score --      Pain Loc --      Pain Edu? --      Excl. in [place]? --     Constitutional: Alert and oriented.  No acute distress. Eyes: Conjunctivae are normal.  Head: Atraumatic. Nose: No congestion/rhinnorhea. Mouth/Throat: Mucous membranes are moist. Neck: No stridor.  No meningeal signs.   Cardiovascular: Normal rate, regular rhythm. Good peripheral circulation. Grossly normal heart sounds.  Prior sternotomy scar is well-healed. Respiratory: Normal respiratory effort.  No retractions. Lungs CTAB. Gastrointestinal: Soft and nontender. No distention.  Musculoskeletal: No lower extremity tenderness nor edema. No gross deformities of extremities. Neurologic:  Normal speech and language. No gross focal neurologic deficits are appreciated.  Skin:  Skin is pale, warm, dry and intact. No rash noted. Psychiatric: Mood and affect are normal. Speech and behavior are normal.  ____________________________________________   LABS (all labs ordered are listed, but only abnormal results are displayed)  Labs Reviewed  CBC WITH DIFFERENTIAL/PLATELET  PROTIME-INR  APTT  COMPREHENSIVE METABOLIC PANEL  TROPONIN I  LIPID PANEL   ____________________________________________  EKG  ED ECG REPORT I, Hinda Kehr, the attending physician,  personally viewed and interpreted this ECG.  Date: 09/16/2018 EKG Time: 1:23 AM Rate: 83 Rhythm: normal sinus rhythm QRS Axis: normal Intervals: Incomplete right bundle branch ST/T Wave abnormalities: Mild ST elevation in leads II, III, aVF, V5, and V6, with notched T waves in lead V2 Narrative Interpretation: Concerning for acute STEMI, likely inferior.   ____________________________________________  RADIOLOGY   ED MD interpretation: No indication for acute imaging  Official radiology report(s): No results found.  ____________________________________________   PROCEDURES  Critical Care performed: Yes, see critical care procedure note(s)   Procedure(s) performed:   .Critical Care Performed by: Hinda Kehr, MD Authorized by: Hinda Kehr, MD   Critical care provider statement:    Critical care time (minutes):  30   Critical care time was exclusive of:  Separately billable procedures and treating other patients   Critical care was necessary to treat or prevent imminent or life-threatening deterioration of the following conditions:  Cardiac failure (STEMI)   Critical care was time spent personally by me on the following activities:  Development of treatment plan with patient or surrogate, discussions with consultants, evaluation of patient's response to treatment, examination of patient, obtaining history from patient or surrogate, ordering and performing treatments and interventions, ordering and review of laboratory studies, ordering and review of radiographic  studies, pulse oximetry, re-evaluation of patient's condition and review of old charts     ____________________________________________   INITIAL IMPRESSION / ASSESSMENT AND PLAN / ED COURSE  As part of my medical decision making, I reviewed the following data within the [place] notes reviewed and incorporated, Labs reviewed , EKG interpreted , Old EKG reviewed, Old chart reviewed,  Discussed with admitting physician , A consult was requested and obtained from this/these consultant(s) Cardiology and Notes from prior ED visits    Extensive cardiac history now likely having an infarction.  Differential also includes pulmonary embolism, aortic dissection, etc.  EKG is borderline but Dr. Saralyn Pilar feels the story is consistent and off and the history is severe enough that the patient would benefit from catheterization, and although does not my call to make, I agree that he would benefit from emergent catheterization given that when compared to prior EKGs the inferior changes are more pronounced and clinically he is having symptoms consistent with cardiac ischemia.  Lab work is pending.  Patient denies currently taking warfarin and states that he last had a 2 months ago.  He is hemodynamically stable in fact with quite elevated blood pressure and is on the way to the Cath Lab.  He received a full dose aspirin prior to arrival and Dr. Saralyn Pilar requested that we not administer heparin prior to taking him to the Cath Lab.  I discussed the case with the hospitalist by phone who will admit the patient after his catheterization.     ____________________________________________  FINAL CLINICAL IMPRESSION(S) / ED DIAGNOSES  Final diagnoses:  ST elevation myocardial infarction (STEMI), unspecified artery (HCC)  Chest pain due to myocardial ischemia, unspecified ischemic chest pain type  Left arm numbness     MEDICATIONS GIVEN DURING THIS VISIT:  Medications  0.9 %  sodium chloride infusion (has no administration in time range)     ED Discharge Orders    None       Note:  This document was prepared using Dragon voice recognition software and may include unintentional dictation errors.    Hinda Kehr, MD 09/16/18 [phone]

## 2018-09-16 NOTE — Progress Notes (Addendum)
2000: Pt aggrastat was stopped as per order. Will continue to monitor.  Wife called staff and reported that pt spit out blood. On assessment pt did have a small amount of blood on tissue. Pt wife reported that he spit out this morning about 2 to 3 times and nurse in ICU was notified. Will Notify [facility].Will continue to monitor.  Update 2215: Talked to Dr. Jerelyn Charles and state to monitor pt and ordered to hold the brilinta at this time. Will continue to monitor.

## 2018-09-16 NOTE — ED Triage Notes (Signed)
Patient onset of CP at midnight. Alert and oriented on arrival. MD and Dr. Josefa Half in room at time of patient arrival.

## 2018-09-16 NOTE — ED Notes (Signed)
Patient states pain is some better but not completely. Ablation for a-fib "has been a while ago." Patient's cardiologist is at [facility].

## 2018-09-16 NOTE — Progress Notes (Signed)
eLink Physician-Brief Progress Note Patient Name: Chase Cabrera DOB: 02/24/1946 MRN: [id number]   Date of Service  09/16/2018  HPI/Events of Note  Need NTG order , came on drip from cath lab  eICU Interventions  NTG titrating drip ordered for now. Hold isosorbide, due at 10 AM, if he still getting NTG till am     Intervention Category Minor Interventions: Routine modifications to care plan (e.g. PRN medications for pain, fever)  Elmer Sow 09/16/2018, 4:43 AM

## 2018-09-16 NOTE — Clinical Social Work Note (Signed)
CSW received a consult for medication assistance. The CSW has updated the [facility] of this need and is signing off. Please consult should needs arise.  Santiago Bumpers, MSW, Latanya Presser [phone]

## 2018-09-16 NOTE — Consult Note (Signed)
[facility] Cardiology  CARDIOLOGY CONSULT NOTE  Patient ID: Chase Cabrera MRN: [id number] DOB/AGE: July 12, 1946 73 y.o.  Admit date: 09/16/2018 Referring Physician Karma Greaser Primary Physician [facility] Primary Cardiologist Roe Reason for Consultation inferolateral wall ST elevation myocardial infarction  HPI: 73 year old gentleman referred for inferolateral wall ST elevation myocardial infarction.  Patient has known coronary artery disease, status post multiple stents, CABG times 4 in 1996.  He was in his usual state of health until 1 hour prior to presenting to [facility] ED with severe substernal chest pain.  ECG revealed 1 mm of ST elevation in leads II III, aVF, V5 and V6.  Review of systems complete and found to be negative unless listed above     Past Medical History:  Diagnosis Date  . Arthritis   . Atrial fibrillation ([place])   . CAD (coronary artery disease)    6 STENTS  . CHF (congestive heart failure) ([place])   . Chicken pox   . Colon polyp   . Corneal dystrophy    bilateral  . Crohn's disease ([place])   . Diabetes ([place]) 2002  . Dysrhythmia    A-FIB AND PAF  . GERD (gastroesophageal reflux disease)   . Gout   . History of kidney stones   . History of shingles   . Hyperlipemia   . Hypertension    2/2 b/l RAS >60% [place] 06/2018 s/p right stent   . Kidney stones   . Myocardial infarction ([place])    x4, last one in 2010  . Peripheral neuropathy   . Peripheral neuropathy   . Peripheral neuropathy   . PUD (peptic ulcer disease)   . Rectus sheath hematoma    02/18/18 8.9 x 4.5 cm then 02/19/18 7.9 x 4.5 cm   . Renal artery stenosis ([place])   . Renal artery stenosis ([place])   . Salzmann's nodular dystrophy 2012  . Sleep apnea   . Spinal stenosis     Past Surgical History:  Procedure Laterality Date  . ABLATION  2012  . APPLICATION VERTERBRAL DEFECT PROSTHETIC  05/01/2013  . ARTHRODESIS ANTERIOR LUMBAR SPINE  05/01/2013  . BACK SURGERY     lumbar fusion  . CARDIAC CATHETERIZATION    .  CARDIAC ELECTROPHYSIOLOGY STUDY AND ABLATION    . CARDIAC SURGERY    . CARDIOVERSION    . CHOLECYSTECTOMY    . COLONOSCOPY WITH PROPOFOL N/A 04/09/2016   Completed for chronic diarrhea.  Few scattered diverticuli.  No mucosal lesions.  Surgeon: Lollie Sails, MD;  Location: [facility] ENDOSCOPY;  Service: Endoscopy;  Laterality: N/A;  . CORNEAL EYE SURGERY Bilateral 07/28/11    09/22/2011  . CORONARY ANGIOPLASTY    . CORONARY ANGIOPLASTY WITH STENT PLACEMENT  03/28/2015   Distal 80% to normal Stent, Dilation Balloon  . CORONARY ARTERY BYPASS GRAFT     4 VESSELS  . EYE SURGERY     bilateral cataract 2017 Dr. Megan Mans  . foot and ankle repair Right 2005  . FRACTURE SURGERY     ANKLE PLATE AND SCREWS  . GALLBLADER    . JOINT REPLACEMENT     left total hip 11/11/15  . JOINT REPLACEMENT     right total hip 12/2017 Dr. Rudene Christians   . KNEE ARTHROSCOPY Right   . LUMBAR SPINE FUSION ONE LEVEL  05/03/2013  . RENAL ARTERY STENT Right   . ROTATOR CUFF REPAIR Right 2006  . TONSILLECTOMY    . TOTAL HIP ARTHROPLASTY Left 11/11/2015   Procedure: TOTAL HIP  ARTHROPLASTY ANTERIOR APPROACH;  Surgeon: Hessie Knows, MD;  Location: [facility] ORS;  Service: Orthopedics;  Laterality: Left;  . TOTAL HIP ARTHROPLASTY Right 12/13/2017   Procedure: TOTAL HIP ARTHROPLASTY ANTERIOR APPROACH;  Surgeon: Hessie Knows, MD;  Location: [facility] ORS;  Service: Orthopedics;  Laterality: Right;  . TRANSCATH PLACEMENT INTRAVASCULAR STENT LEG  03/2015    Medications Prior to Admission  Medication Sig Dispense Refill Last Dose  . amitriptyline (ELAVIL) 50 MG tablet Take 50 mg by mouth at bedtime as needed.   1 prn at prn  . amLODipine (NORVASC) 5 MG tablet Take 1 tablet (5 mg total) by mouth 2 (two) times daily. If blood pressure >140/>90 make take another 1 pill of 5 mg 180 tablet 3 Unknown at Unknown  . aspirin EC 81 MG EC tablet Take 1 tablet (81 mg total) by mouth daily. 30 tablet 0 Unknown at Unknown  . carvedilol (COREG) 25 MG tablet  Take 1 tablet (25 mg total) by mouth 2 (two) times daily with a meal. 180 tablet 3 Unknown at Unknown  . Cholecalciferol 50000 units TABS Take 1 tablet by mouth once a week. 13 tablet 1 Unknown at Unknown  . cloNIDine (CATAPRES) 0.2 MG tablet Take by mouth 2 (two) times daily.   11 Unknown at Unknown  . clopidogrel (PLAVIX) 75 MG tablet Take 75 mg by mouth daily.   Unknown at Unknown  . colchicine 0.6 MG tablet TAKE 1 TABLET BY MOUTH ONCE DAILY 60 tablet 0 Unknown at Unknown  . diclofenac sodium (VOLTAREN) 1 % GEL Apply 2 g topically 4 (four) times daily as needed (for pain).    PRN at PRN  . dicyclomine (BENTYL) 10 MG capsule Take 10 mg by mouth 3 (three) times daily before meals.    Unknown at Unknown  . fluticasone (FLONASE) 50 MCG/ACT nasal spray Place 2 sprays into both nostrils daily. 16 g 11 Unknown at Unknown  . gabapentin (NEURONTIN) 300 MG capsule take 2 capsules (649m) by mouth three times a day 180 capsule 1 Unknown at Unknown  . hydrALAZINE (APRESOLINE) 100 MG tablet TAKE 100 MG BY MOUTH THREE TIMES A DAY 270 tablet 1 Unknown at Unknown  . insulin lispro (HUMALOG) 100 UNIT/ML KiwkPen Inject 14-32 Units into the skin 3 (three) times daily. (according to sliding scale - max 96u over 24 hours)   Unknown at Unknown  . ipratropium (ATROVENT) 0.06 % nasal spray Place 2 sprays into both nostrils 4 (four) times daily. 15 mL 12 Unknown at Unknown  . isosorbide mononitrate (IMDUR) 120 MG 24 hr tablet TAKE 1 TABLET BY MOUTH EVERY DAY 90 tablet 1 Unknown at Unknown  . levocetirizine (XYZAL) 5 MG tablet Take 1 tablet (5 mg total) by mouth every evening.   Unknown at Unknown  . metFORMIN (GLUCOPHAGE) 1000 MG tablet Take 1,000 mg by mouth 2 (two) times daily with a meal.   Unknown at Unknown  . montelukast (SINGULAIR) 10 MG tablet Take 1 tablet (10 mg total) by mouth at bedtime. 90 tablet 3 Unknown at Unknown  . Multiple Vitamin (MULTI-VITAMINS) TABS Take 1 tablet by mouth daily.   Unknown at Unknown   . nitroGLYCERIN (NITROSTAT) 0.4 MG SL tablet Place 1 tablet (0.4 mg total) under the tongue every 5 (five) minutes x 3 doses as needed. For chest pain.  Call 911 if no relief after 3 tablets 30 tablet 11 PRN at PRN  . omega-3 acid ethyl esters (LOVAZA) 1 g capsule Take 2 capsules (2  g total) by mouth 2 (two) times daily. 360 capsule 3 Unknown at Unknown  . omeprazole (PRILOSEC) 40 MG capsule Take 40 mg by mouth 2 (two) times daily.    Unknown at Unknown  . pravastatin (PRAVACHOL) 40 MG tablet Take 1 tablet (40 mg total) by mouth daily. 90 tablet 3 Unknown at Unknown  . probenecid (BENEMID) 500 MG tablet Take 500 mg by mouth 2 (two) times daily.   0 Unknown at Unknown  . sodium chloride HYPERTONIC 3 % nebulizer solution Take by nebulization 2 (two) times daily. 300 mL 11 Unknown at Unknown  . TRESIBA FLEXTOUCH 200 UNIT/ML SOPN Inject 90 Units into the skin at bedtime.  11 Unknown at Unknown  . vitamin C (ASCORBIC ACID) 500 MG tablet Take 500 mg by mouth daily.    Unknown at Unknown  . Vitamin E 400 units TABS Take 400 Units by mouth 2 (two) times daily.   Unknown at Unknown  . dextromethorphan-guaiFENesin (MUCINEX DM) 30-600 MG 12hr tablet Take by mouth.   Completed Course at Unknown time  . dextromethorphan-guaiFENesin (MUCINEX DM) 30-600 MG 12hr tablet Take 1 tablet by mouth 2 (two) times daily.   Completed Course at Unknown time   Social History   Socioeconomic History  . Marital status: Married    Spouse name: Not on file  . Number of children: Not on file  . Years of education: Not on file  . Highest education level: Not on file  Occupational History  . Not on file  Social Needs  . Financial resource strain: Not very hard  . Food insecurity:    Worry: Never true    Inability: Never true  . Transportation needs:    Medical: No    Non-medical: No  Tobacco Use  . Smoking status: Former Smoker    Packs/day: 1.00    Years: 3.00    Pack years: 3.00    Types: Cigarettes    Last  attempt to quit: 06/30/1962    Years since quitting: 56.2  . Smokeless tobacco: Former Systems developer    Types: [place] date: 12/05/1968  Substance and Sexual Activity  . Alcohol use: No  . Drug use: No  . Sexual activity: Not Currently  Lifestyle  . Physical activity:    Days per week: 2 days    Minutes per session: 30 min  . Stress: Not at all  Relationships  . Social connections:    Talks on phone: Patient refused    Gets together: Patient refused    Attends religious service: Patient refused    Active member of club or organization: Patient refused    Attends meetings of clubs or organizations: Patient refused    Relationship status: Patient refused  . Intimate partner violence:    Fear of current or ex partner: No    Emotionally abused: No    Physically abused: No    Forced sexual activity: No  Other Topics Concern  . Not on file  Social History Narrative   Married     Family History  Problem Relation Age of Onset  . CVA Father   . Stroke Father   . Lung cancer Mother   . Arthritis Mother   . Stomach cancer Sister   . Colon cancer Sister   . Diabetes Brother       Review of systems complete and found to be negative unless listed above      PHYSICAL EXAM  General: Well developed,  well nourished, in no acute distress HEENT:  Normocephalic and atramatic Neck:  No JVD.  Lungs: Clear bilaterally to auscultation and percussion. Heart: HRRR . Normal S1 and S2 without gallops or murmurs.  Abdomen: Bowel sounds are positive, abdomen soft and non-tender  Msk:  Back normal, normal gait. Normal strength and tone for age. Extremities: No clubbing, cyanosis or edema.   Neuro: Alert and oriented X 3. Psych:  Good affect, responds appropriately  Labs:   Lab Results  Component Value Date   WBC 12.2 (H) 09/16/2018   HGB 13.8 09/16/2018   HCT 41.3 09/16/2018   MCV 96.9 09/16/2018   PLT 266 09/16/2018    Recent Labs  Lab 09/16/18 0124  NA 139  K 3.4*  CL 109   CO2 24  BUN 14  CREATININE 0.44*  CALCIUM 9.3  PROT 7.5  BILITOT 0.2*  ALKPHOS 73  ALT 18  AST 19  GLUCOSE 108*   Lab Results  Component Value Date   TROPONINI <0.03 09/16/2018    Lab Results  Component Value Date   CHOL 131 09/16/2018   CHOL 144 05/01/2018   CHOL 156 04/19/2018   Lab Results  Component Value Date   HDL 57 09/16/2018   HDL 26 (L) 05/01/2018   HDL 32 (L) 04/19/2018   Lab Results  Component Value Date   LDLCALC 65 09/16/2018   LDLCALC UNABLE TO CALCULATE IF TRIGLYCERIDE OVER 400 mg/dL 05/01/2018   LDLCALC UNABLE TO CALCULATE IF TRIGLYCERIDE OVER 400 mg/dL 04/19/2018   Lab Results  Component Value Date   TRIG 45 09/16/2018   TRIG 609 (H) 05/01/2018   TRIG 404 (H) 04/19/2018   Lab Results  Component Value Date   CHOLHDL 2.3 09/16/2018   CHOLHDL 5.5 05/01/2018   CHOLHDL 4.9 04/19/2018   Lab Results  Component Value Date   LDLDIRECT 51.0 03/24/2016      Radiology: Ct Abdomen Pelvis W Contrast  Addendum Date: 08/22/2018   ADDENDUM REPORT: 08/22/2018 11:49 ADDENDUM: These results will be called to the ordering clinician or representative by the Radiologist Assistant, and communication documented in the PACS or zVision Dashboard. Electronically Signed   By: Genia Del M.D.   On: 08/22/2018 11:49   Result Date: 08/22/2018 CLINICAL DATA:  73 year old male with worsening diarrhea and abdominal distension. No history of cancer or injury. Prior cholecystectomy, spine fusion, renal artery stenting and hip replacements. Subsequent encounter. EXAM: CT ABDOMEN AND PELVIS WITH CONTRAST TECHNIQUE: Multidetector CT imaging of the abdomen and pelvis was performed using the standard protocol following bolus administration of intravenous contrast. CONTRAST:  151m ISOVUE-300 IOPAMIDOL (ISOVUE-300) INJECTION 61% COMPARISON:  02/19/2018 and 11/16/2017 CT.  11/30/2017 PET-CT. FINDINGS: Lower chest: Motion degradation. Calcified granuloma largest left lung base.  Cardiomegaly. Prominent coronary artery calcifications. Mitral valve calcifications. Hepatobiliary: Enlarged fatty liver spanning over 20 cm with slightly lobulated contour without other findings of cirrhosis. Scattered hepatic calcifications. Post cholecystectomy. Pancreas: No worrisome pancreatic mass or inflammation. Spleen: Splenic calcifications.  Spleen top-normal length. Adrenals/Urinary Tract: No obstructing stone or hydronephrosis. Low-density renal lesions larger ones which are cysts others too small to characterize. Nonobstructing right lower pole renal calculi. No adrenal lesion. Evaluation of urinary bladder limited by streak artifact from hip replacements. No gross abnormality noted. Stomach/Bowel: No primary bowel inflammatory process detected. Stomach under distended. Evaluation for colonic mass limited by stool and areas of under distension. Mild diastasis rectus muscles with small lower abdominal anterior wall hernia through which knuckle of small bowel  traverses but does not become entrapped. Vascular/Lymphatic: Prominent atherosclerotic changes aorta and aortic branch vessels. No abdominal aortic aneurysm or large vessel occlusion. Prior stenting right renal artery. Scattered normal size lymph nodes. Reproductive: Enlarged lobulated prostate gland. Other: Interval increase in size of nonspecific partially calcified mass within the mesentery adjacent to the junction of the descending colon and sigmoid colon now with maximal transverse dimension 2.5 cm versus prior 2.1 cm (series 2, image 59). Stable 1.1 cm mesenteric nodule immediately anterior to the region (series 2, image 58). Increase in size of partially calcified 1.4 cm mass superior to the transverse colon previously measuring 1.2 cm (series 2, image 30). Minimal increase in size of calcified 1 cm and 9 mm lesion anterior to the proximal descending colon (series 6 images 181 and 184). No free intraperitoneal air. Musculoskeletal:  Postsurgical changes with fusion L3-S1. Degenerative changes above this level. Prior hip replacements. IMPRESSION: 1. Interval increase in size of nonspecific partially calcified mass within the mesentery adjacent to the junction of the descending colon and sigmoid colon now with maximal transverse dimension 2.5 cm versus prior 2.1 cm (series 2, image 59). Stable 1.1 cm mesenteric nodule immediately anterior to the region (series 2, image 58). Increase in size of partially calcified 1.4 cm mass superior to the transverse colon previously measuring 1.2 cm (series 2, image 30). Minimal increase in size of calcified 1 cm and 9 mm lesion anterior to the proximal descending colon (series 6 images 181 and 184). Etiology of these findings is indeterminate. As patient has calcified granulomas in the lung bases, in the liver and spleen it is possible findings reflect result of granulomatous disease however, peritoneal spread of tumor or carcinoid cannot be excluded. 2. Enlarged fatty liver with slightly lobulated contour without other findings of cirrhosis. 3. No extraluminal bowel inflammatory process identified. Evaluation of the stomach and portions of small bowel and colon limited by under distension and presence of marked amount of stool. 4. Prominent coronary artery calcifications.  Cardiomegaly. 5. Aortic Atherosclerosis (ICD10-I70.0). Atherosclerotic changes aortic branch vessels. No abdominal aortic aneurysm. Prior stent right renal artery. 6. Enlarged prostate gland. 7. Postsurgical changes lumbar spine and hips. Electronically Signed: By: Genia Del M.D. On: 08/22/2018 11:30   Dg Chest Port 1 View  Result Date: 09/16/2018 CLINICAL DATA:  ST-elevation myocardial infarction. Acute onset of generalized chest pain. EXAM: PORTABLE CHEST 1 VIEW COMPARISON:  Chest radiograph and CTA of the chest performed 06/13/2018 FINDINGS: The lungs are well-aerated. Mild vascular congestion is noted. There is no evidence of  focal opacification, pleural effusion or pneumothorax. The cardiomediastinal silhouette is borderline normal in size. The patient is status post median sternotomy. A metallic device is noted overlying the mediastinum. No acute osseous abnormalities are seen. External pacing pads are noted. The patient is status post right-sided rotator cuff repair. IMPRESSION: Mild vascular congestion noted; lungs remain grossly clear. Electronically Signed   By: Garald Balding M.D.   On: 09/16/2018 01:50    EKG: Normal sinus rhythm with ST elevation in leads II, III, aVF, V5 and V6  ASSESSMENT AND PLAN:   1.  Acute inferolateral ST elevation myocardial infarction  Recommendations  1.  Emergent cardiac catheterization, and possible primary PCI  Signed: Isaias Cowman MD,PhD, [facility] 09/16/2018, 3:35 AM

## 2018-09-16 NOTE — Progress Notes (Signed)
Pt OOB to chair with minimal assist

## 2018-09-16 NOTE — Consult Note (Signed)
Reason for Consult: To assist with critical care management Referring Physician: Hospitalist service  Chase Cabrera is an 73 y.o. male.  HPI: Chase Cabrera is a very pleasant 73 year old gentleman with a past medical history remarkable for hypertension, hyperlipidemia, diabetes, Crohn's disease, atrial fibrillation, coronary artery disease, status post bypass and multiple stenting, peripheral neuropathy, nephrolithiasis, obstructive sleep apnea, renal artery stenosis presented with significant chest pain with radiation down to the left arm.  Presented to the emergency department and was found to have inferolateral wall ST segment elevation myocardial infarction.  He was subsequently taken to the Cath Lab, status post PCI/DES OM 3 This morning patient is resting comfortably pain-free in no acute distress  Past Medical History:  Diagnosis Date  . Arthritis   . Atrial fibrillation ([place])   . CAD (coronary artery disease)    6 STENTS  . CHF (congestive heart failure) ([place])   . Chicken pox   . Colon polyp   . Corneal dystrophy    bilateral  . Crohn's disease ([place])   . Diabetes ([place]) 2002  . Dysrhythmia    A-FIB AND PAF  . GERD (gastroesophageal reflux disease)   . Gout   . History of kidney stones   . History of shingles   . Hyperlipemia   . Hypertension    2/2 b/l RAS >60% [place] 06/2018 s/p right stent   . Kidney stones   . Myocardial infarction ([place])    x4, last one in 2010  . Peripheral neuropathy   . Peripheral neuropathy   . Peripheral neuropathy   . PUD (peptic ulcer disease)   . Rectus sheath hematoma    02/18/18 8.9 x 4.5 cm then 02/19/18 7.9 x 4.5 cm   . Renal artery stenosis ([place])   . Renal artery stenosis ([place])   . Salzmann's nodular dystrophy 2012  . Sleep apnea   . Spinal stenosis     Past Surgical History:  Procedure Laterality Date  . ABLATION  2012  . APPLICATION VERTERBRAL DEFECT PROSTHETIC  05/01/2013  . ARTHRODESIS ANTERIOR LUMBAR SPINE  05/01/2013  . BACK  SURGERY     lumbar fusion  . CARDIAC CATHETERIZATION    . CARDIAC ELECTROPHYSIOLOGY STUDY AND ABLATION    . CARDIAC SURGERY    . CARDIOVERSION    . CHOLECYSTECTOMY    . COLONOSCOPY WITH PROPOFOL N/A 04/09/2016   Completed for chronic diarrhea.  Few scattered diverticuli.  No mucosal lesions.  Surgeon: Lollie Sails, MD;  Location: [facility];  Service: Endoscopy;  Laterality: N/A;  . CORNEAL EYE SURGERY Bilateral 07/28/11    09/22/2011  . CORONARY ANGIOPLASTY    . CORONARY ANGIOPLASTY WITH STENT PLACEMENT  03/28/2015   Distal 80% to normal Stent, Dilation Balloon  . CORONARY ARTERY BYPASS GRAFT     4 VESSELS  . EYE SURGERY     bilateral cataract 2017 Dr. Megan Mans  . foot and ankle repair Right 2005  . FRACTURE SURGERY     ANKLE PLATE AND SCREWS  . GALLBLADER    . JOINT REPLACEMENT     left total hip 11/11/15  . JOINT REPLACEMENT     right total hip 12/2017 Dr. Rudene Christians   . KNEE ARTHROSCOPY Right   . LUMBAR SPINE FUSION ONE LEVEL  05/03/2013  . RENAL ARTERY STENT Right   . ROTATOR CUFF REPAIR Right 2006  . TONSILLECTOMY    . TOTAL HIP ARTHROPLASTY Left 11/11/2015   Procedure: TOTAL HIP ARTHROPLASTY ANTERIOR APPROACH;  Surgeon: Hessie Knows, MD;  Location: [facility] ORS;  Service: Orthopedics;  Laterality: Left;  . TOTAL HIP ARTHROPLASTY Right 12/13/2017   Procedure: TOTAL HIP ARTHROPLASTY ANTERIOR APPROACH;  Surgeon: Hessie Knows, MD;  Location: [facility] ORS;  Service: Orthopedics;  Laterality: Right;  . TRANSCATH PLACEMENT INTRAVASCULAR STENT LEG  03/2015    Family History  Problem Relation Age of Onset  . CVA Father   . Stroke Father   . Lung cancer Mother   . Arthritis Mother   . Stomach cancer Sister   . Colon cancer Sister   . Diabetes Brother     Social History:  reports that he quit smoking about 56 years ago. His smoking use included cigarettes. He has a 3.00 pack-year smoking history. He quit smokeless tobacco use about 49 years ago.  His smokeless tobacco use included  chew. He reports that he does not drink alcohol or use drugs.  Allergies:  Allergies  Allergen Reactions  . Allopurinol Diarrhea, Other (See Comments) and Nausea And Vomiting    Other Reaction: GI Upset  . Atenolol Other (See Comments)    Other Reaction: bradycardia  . Atorvastatin Other (See Comments)    Other Reaction: muscle aches  . Ramipril Rash  . Rosuvastatin Other (See Comments)    Muscle aches  . Simvastatin Other (See Comments)    Other Reaction: MUSCLE ACHES (Zocor)  . Valsartan Other (See Comments)    Other Reaction: facial swelling  . Cardizem [Diltiazem] Rash    Medications: I have reviewed the patient's current medications.  Results for orders placed or performed during the hospital encounter of 09/16/18 (from the past 48 hour(s))  CBC with Differential/Platelet     Status: Abnormal   Collection Time: 09/16/18  1:24 AM  Result Value Ref Range   WBC 12.2 (H) 4.0 - 10.5 K/uL   RBC 4.26 4.22 - 5.81 MIL/uL   Hemoglobin 13.8 13.0 - 17.0 g/dL   HCT 41.3 39.0 - 52.0 %   MCV 96.9 80.0 - 100.0 fL   MCH 32.4 26.0 - 34.0 pg   MCHC 33.4 30.0 - 36.0 g/dL   RDW 13.5 11.5 - 15.5 %   Platelets 266 150 - 400 K/uL   nRBC 0.0 0.0 - 0.2 %   Neutrophils Relative % 85 %   Neutro Abs 10.4 (H) 1.7 - 7.7 K/uL   Lymphocytes Relative 9 %   Lymphs Abs 1.1 0.7 - 4.0 K/uL   Monocytes Relative 5 %   Monocytes Absolute 0.6 0.1 - 1.0 K/uL   Eosinophils Relative 0 %   Eosinophils Absolute 0.0 0.0 - 0.5 K/uL   Basophils Relative 0 %   Basophils Absolute 0.0 0.0 - 0.1 K/uL   Immature Granulocytes 1 %   Abs Immature Granulocytes 0.09 (H) 0.00 - 0.07 K/uL    Comment: Performed at [facility], [address] [place], [place] [address]  Protime-INR     Status: None   Collection Time: 09/16/18  1:24 AM  Result Value Ref Range   Prothrombin Time 14.0 11.4 - 15.2 seconds   INR 1.09     Comment: Performed at [facility], [address] [place], [place] [address]   APTT     Status: None   Collection Time: 09/16/18  1:24 AM  Result Value Ref Range   aPTT 30 24 - 36 seconds    Comment: Performed at [facility], [address], [place], [place] [address]  Comprehensive metabolic panel  Status: Abnormal   Collection Time: 09/16/18  1:24 AM  Result Value Ref Range   Sodium 139 135 - 145 mmol/L   Potassium 3.4 (L) 3.5 - 5.1 mmol/L   Chloride 109 98 - 111 mmol/L   CO2 24 22 - 32 mmol/L   Glucose, Bld 108 (H) 70 - 99 mg/dL   BUN 14 8 - 23 mg/dL   Creatinine, Ser 0.44 (L) 0.61 - 1.24 mg/dL   Calcium 9.3 8.9 - 10.3 mg/dL   Total Protein 7.5 6.5 - 8.1 g/dL   Albumin 4.5 3.5 - 5.0 g/dL   AST 19 15 - 41 U/L   ALT 18 0 - 44 U/L   Alkaline Phosphatase 73 38 - 126 U/L   Total Bilirubin 0.2 (L) 0.3 - 1.2 mg/dL   GFR calc non Af Amer >60 >60 mL/min   GFR calc Af Amer >60 >60 mL/min   Anion gap 6 5 - 15    Comment: Performed at [facility], [address] [place], [place] [address]  Troponin I - ONCE - STAT     Status: None   Collection Time: 09/16/18  1:24 AM  Result Value Ref Range   Troponin I <0.03 <0.03 ng/mL    Comment: Performed at [facility], [address] [place], [place] [address]  Lipid panel     Status: None   Collection Time: 09/16/18  1:24 AM  Result Value Ref Range   Cholesterol 131 0 - 200 mg/dL   Triglycerides 45 <150 mg/dL   HDL 57 >40 mg/dL   Total CHOL/HDL Ratio 2.3 RATIO   VLDL 9 0 - 40 mg/dL   LDL Cholesterol 65 0 - 99 mg/dL    Comment:        Total Cholesterol/HDL:CHD Risk Coronary Heart Disease Risk Table                     Men   Women  1/2 Average Risk   3.4   3.3  Average Risk       5.0   4.4  2 X Average Risk   9.6   7.1  3 X Average Risk  23.4   11.0        Use the calculated Patient Ratio above and the CHD Risk Table to determine the patient's CHD Risk.        ATP III CLASSIFICATION (LDL):  <100     mg/dL   Optimal  100-129  mg/dL   Near or Above                     Optimal  130-159  mg/dL   Borderline  160-189  mg/dL   High  >190     mg/dL   Very High Performed at [facility], [address] [place], [place] [address]   I-STAT, [place] 8     Status: Abnormal   Collection Time: 09/16/18  1:55 AM  Result Value Ref Range   Sodium 136 135 - 145 mmol/L   Potassium 4.9 3.5 - 5.1 mmol/L   Chloride 99 98 - 111 mmol/L   BUN 22 8 - 23 mg/dL   Creatinine, Ser 1.10 0.61 - 1.24 mg/dL   Glucose, Bld 298 (H) 70 - 99 mg/dL   Calcium, Ion 1.19 1.15 - 1.40 mmol/L   TCO2 23 22 - 32 mmol/L   Hemoglobin 13.9 13.0 - 17.0 g/dL   HCT 41.0 39.0 - 52.0 %  POCT Activated clotting time     Status: None   Collection Time: 09/16/18  2:21 AM  Result Value Ref Range   Activated Clotting Time 329 seconds  MRSA PCR Screening     Status: None   Collection Time: 09/16/18  3:46 AM  Result Value Ref Range   MRSA by PCR NEGATIVE NEGATIVE    Comment:        The GeneXpert MRSA Assay (FDA approved for NASAL specimens only), is one component of a comprehensive MRSA colonization surveillance program. It is not intended to diagnose MRSA infection nor to guide or monitor treatment for MRSA infections. Performed at [facility], [address] [place], [place] [address]   Glucose, capillary     Status: Abnormal   Collection Time: 09/16/18  3:49 AM  Result Value Ref Range   Glucose-Capillary 289 (H) 70 - 99 mg/dL  Troponin I - Once     Status: Abnormal   Collection Time: 09/16/18  5:20 AM  Result Value Ref Range   Troponin I 0.38 (HH) <0.03 ng/mL    Comment: CRITICAL RESULT CALLED TO, READ BACK BY AND VERIFIED WITH MISTY CAUSEY ON 09/16/2018 AT 0618 QSD Performed at [facility], [address] [place], [place] [address]   Glucose, capillary     Status: Abnormal   Collection Time: 09/16/18  7:49 AM  Result Value Ref Range   Glucose-Capillary 215 (H) 70 - 99 mg/dL    Dg Chest Port 1 View  Result Date: 09/16/2018 CLINICAL DATA:   ST-elevation myocardial infarction. Acute onset of generalized chest pain. EXAM: PORTABLE CHEST 1 VIEW COMPARISON:  Chest radiograph and CTA of the chest performed 06/13/2018 FINDINGS: The lungs are well-aerated. Mild vascular congestion is noted. There is no evidence of focal opacification, pleural effusion or pneumothorax. The cardiomediastinal silhouette is borderline normal in size. The patient is status post median sternotomy. A metallic device is noted overlying the mediastinum. No acute osseous abnormalities are seen. External pacing pads are noted. The patient is status post right-sided rotator cuff repair. IMPRESSION: Mild vascular congestion noted; lungs remain grossly clear. Electronically Signed   By: Garald Balding M.D.   On: 09/16/2018 01:50    ROS   Please see HPI for pertinent positives  Blood pressure (!) 165/74, pulse 69, temperature 98.3 F (36.8 C), resp. rate (!) 26, height 5' 8"  (1.727 m), weight 93.6 kg, SpO2 93 %. Physical Exam General: Patient is awake, alert, oriented in no acute distress HEENT: Trachea is midline, no thyromegaly appreciated, unable to assess neck veins Cardiovascular: Regular rate and rhythm Pulmonary: Clear to auscultation Abdominal: Positive bowel sounds, soft exam Extremities: No clubbing, cyanosis or edema noted Neurologic: No focal deficits appreciated  Pertinent labs reveal a troponin of 0.38, glucose of 215, potassium 3.4 white count 12.2, chest x-ray reveals cardiomegaly with mild vascular congestion  Assessment/Plan:  Status post STEMI, PCI/DES.  Presently on aspirin, Brilinta, carvedilol, statin, isosorbide mononitrate, hydralazine, amlodipine doing well this morning stable hemodynamics no arrhythmia noted overnight most likely will be able to transfer to cards telemetry today  Diabetes mellitus.  On insulin with sliding scale coverage  Hypertension.  Please see above listed cardiovascular drugs  Hypokalemia.  Will  replace  Andreea Arca 09/16/2018, 8:08 AM

## 2018-09-17 LAB — BASIC METABOLIC PANEL
Anion gap: 10 (ref 5–15)
BUN: 21 mg/dL (ref 8–23)
CO2: 24 mmol/L (ref 22–32)
Calcium: 8.6 mg/dL — ABNORMAL LOW (ref 8.9–10.3)
Chloride: 98 mmol/L (ref 98–111)
Creatinine, Ser: 0.9 mg/dL (ref 0.61–1.24)
GFR calc Af Amer: >60 mL/min (ref >60)
GFR calc non Af Amer: >60 mL/min (ref >60)
Glucose, Bld: 206 mg/dL — ABNORMAL HIGH (ref 70–99)
Potassium: 3.7 mmol/L (ref 3.5–5.1)
Sodium: 132 mmol/L — ABNORMAL LOW (ref 135–145)

## 2018-09-17 LAB — GLUCOSE, CAPILLARY
Glucose-Capillary: 167 mg/dL — ABNORMAL HIGH (ref 70–99)
Glucose-Capillary: 255 mg/dL — ABNORMAL HIGH (ref 70–99)

## 2018-09-17 MED ORDER — TICAGRELOR 90 MG PO TABS: 90.0000 mg | ORAL_TABLET | Freq: Two times a day (BID) | ORAL | 0 refills | Status: DC

## 2018-09-17 NOTE — Progress Notes (Addendum)
Pt reports spitting up blood upon walking into room, bright red small mount of blood noted on tissue, Dr. Saralyn Pilar at bedside and aware. Per Dr. Saralyn Pilar okay to give brillinta am dose. Will continue to monitor.

## 2018-09-17 NOTE — Discharge Summary (Signed)
[place] [facility] [place] NAME: Chase Cabrera    MR#:  [id number]  DATE OF BIRTH:  1946-09-02  DATE OF ADMISSION:  09/16/2018 ADMITTING PHYSICIAN: Isaias Cowman, MD  DATE OF DISCHARGE: 09/17/2017  PRIMARY CARE PHYSICIAN: McLean-Scocuzza, Nino Glow, MD    ADMISSION DIAGNOSIS:  Left arm numbness [R20.0] ST elevation myocardial infarction (STEMI), unspecified artery (HCC) [I21.3] Chest pain due to myocardial ischemia, unspecified ischemic chest pain type [I25.9] Acute ST elevation myocardial infarction (STEMI) of lateral wall (HCC) [I21.29]  DISCHARGE DIAGNOSIS:  Active Problems:   STEMI (ST elevation myocardial infarction) ([place])   Acute ST elevation myocardial infarction (STEMI) of lateral wall ([place])   SECONDARY DIAGNOSIS:   Past Medical History:  Diagnosis Date  . Arthritis   . Atrial fibrillation ([place])   . CAD (coronary artery disease)    6 STENTS  . CHF (congestive heart failure) ([place])   . Chicken pox   . Colon polyp   . Corneal dystrophy    bilateral  . Crohn's disease ([place])   . Diabetes ([place]) 2002  . Dysrhythmia    A-FIB AND PAF  . GERD (gastroesophageal reflux disease)   . Gout   . History of kidney stones   . History of shingles   . Hyperlipemia   . Hypertension    2/2 b/l RAS >60% [place] 06/2018 s/p right stent   . Kidney stones   . Myocardial infarction ([place])    x4, last one in 2010  . Peripheral neuropathy   . Peripheral neuropathy   . Peripheral neuropathy   . PUD (peptic ulcer disease)   . Rectus sheath hematoma    02/18/18 8.9 x 4.5 cm then 02/19/18 7.9 x 4.5 cm   . Renal artery stenosis ([place])   . Renal artery stenosis ([place])   . Salzmann's nodular dystrophy 2012  . Sleep apnea   . Spinal stenosis     HOSPITAL COURSE:   1.  STEMI inferior lateral wall.  Stent placed ostium SVG to OM 3 by Dr. Lorinda Creed.  Patient does want a follow-up with his cardiologist Dr. Concha Pyo and they do have an appointment this  week.  Aspirin, Brilinta, Coreg and pravastatin.  Patient has allergies to Crestor, atorvastatin and Zocor.  Cardiac rehab prescribed. 2.  Essential hypertension on Coreg, clonidine, Norvasc and hydralazine 3.  Type 2 diabetes mellitus on Tresiba at home.  Hold Glucophage today and can restart tomorrow. 4.  Hyperlipidemia unspecified.  Patient on pravastatin.  Patient has allergies to Crestor, atorvastatin and simvastatin.  LDL 65 and at goal. 5.  GERD on Protonix 6.  The patient did cough up a little bit of blood.  His chest x-ray was negative.  Patient is off the IV blood thinners at this point.  Continue to watch.  DISCHARGE CONDITIONS:   Satisfactory  CONSULTS OBTAINED:  Treatment Team:  Arta Silence, MD Isaias Cowman, MD  DRUG ALLERGIES:   Allergies  Allergen Reactions  . Allopurinol Diarrhea, Other (See Comments) and Nausea And Vomiting    Other Reaction: GI Upset  . Atenolol Other (See Comments)    Other Reaction: bradycardia  . Atorvastatin Other (See Comments)    Other Reaction: muscle aches  . Ramipril Rash  . Rosuvastatin Other (See Comments)    Muscle aches  . Simvastatin Other (See Comments)    Other Reaction: MUSCLE ACHES (Zocor)  . Valsartan Other (See Comments)    Other Reaction: facial swelling  . Cardizem [  Diltiazem] Rash    DISCHARGE MEDICATIONS:   Allergies as of 09/17/2018      Reactions   Allopurinol Diarrhea, Other (See Comments), Nausea And Vomiting   Other Reaction: GI Upset   Atenolol Other (See Comments)   Other Reaction: bradycardia   Atorvastatin Other (See Comments)   Other Reaction: muscle aches   Ramipril Rash   Rosuvastatin Other (See Comments)   Muscle aches   Simvastatin Other (See Comments)   Other Reaction: MUSCLE ACHES (Zocor)   Valsartan Other (See Comments)   Other Reaction: facial swelling   Cardizem [diltiazem] Rash      Medication List    STOP taking these medications   clopidogrel 75 MG tablet Commonly  known as:  PLAVIX   metFORMIN 1000 MG tablet Commonly known as:  GLUCOPHAGE     TAKE these medications   amitriptyline 50 MG tablet Commonly known as:  ELAVIL Take 50 mg by mouth at bedtime as needed.   amLODipine 5 MG tablet Commonly known as:  NORVASC Take 1 tablet (5 mg total) by mouth 2 (two) times daily. If blood pressure >140/>90 make take another 1 pill of 5 mg   aspirin 81 MG EC tablet Take 1 tablet (81 mg total) by mouth daily.   BENTYL 10 MG capsule Generic drug:  dicyclomine Take 10 mg by mouth 3 (three) times daily before meals.   carvedilol 25 MG tablet Commonly known as:  COREG Take 1 tablet (25 mg total) by mouth 2 (two) times daily with a meal.   Cholecalciferol 1.25 MG (50000 UT) Tabs Take 1 tablet by mouth once a week.   cloNIDine 0.2 MG tablet Commonly known as:  CATAPRES Take by mouth 2 (two) times daily.   colchicine 0.6 MG tablet TAKE 1 TABLET BY MOUTH ONCE DAILY   dextromethorphan-guaiFENesin 30-600 MG 12hr tablet Commonly known as:  [place] DM Take by mouth. What changed:  Another medication with the same name was removed. Continue taking this medication, and follow the directions you see here.   diclofenac sodium 1 % Gel Commonly known as:  VOLTAREN Apply 2 g topically 4 (four) times daily as needed (for pain).   fluticasone 50 MCG/ACT nasal spray Commonly known as:  FLONASE Place 2 sprays into both nostrils daily.   gabapentin 300 MG capsule Commonly known as:  NEURONTIN take 2 capsules (620m) by mouth three times a day   hydrALAZINE 100 MG tablet Commonly known as:  APRESOLINE TAKE 100 MG BY MOUTH THREE TIMES A DAY   insulin lispro 100 UNIT/ML KiwkPen Commonly known as:  HUMALOG Inject 14-32 Units into the skin 3 (three) times daily. (according to sliding scale - max 96u over 24 hours)   ipratropium 0.06 % nasal spray Commonly known as:  ATROVENT Place 2 sprays into both nostrils 4 (four) times daily.   isosorbide  mononitrate 120 MG 24 hr tablet Commonly known as:  IMDUR TAKE 1 TABLET BY MOUTH EVERY DAY   levocetirizine 5 MG tablet Commonly known as:  XYZAL Take 1 tablet (5 mg total) by mouth every evening.   montelukast 10 MG tablet Commonly known as:  SINGULAIR Take 1 tablet (10 mg total) by mouth at bedtime.   MULTI-VITAMINS Tabs Take 1 tablet by mouth daily.   nitroGLYCERIN 0.4 MG SL tablet Commonly known as:  NITROSTAT Place 1 tablet (0.4 mg total) under the tongue every 5 (five) minutes x 3 doses as needed. For chest pain.  Call 911 if no relief  after 3 tablets   omega-3 acid ethyl esters 1 g capsule Commonly known as:  LOVAZA Take 2 capsules (2 g total) by mouth 2 (two) times daily.   omeprazole 40 MG capsule Commonly known as:  PRILOSEC Take 40 mg by mouth 2 (two) times daily.   pravastatin 40 MG tablet Commonly known as:  PRAVACHOL Take 1 tablet (40 mg total) by mouth daily.   probenecid 500 MG tablet Commonly known as:  BENEMID Take 500 mg by mouth 2 (two) times daily.   sodium chloride HYPERTONIC 3 % nebulizer solution Take by nebulization 2 (two) times daily.   ticagrelor 90 MG Tabs tablet Commonly known as:  BRILINTA Take 1 tablet (90 mg total) by mouth 2 (two) times daily.   TRESIBA FLEXTOUCH 200 UNIT/ML Sopn Generic drug:  Insulin Degludec Inject 90 Units into the skin at bedtime.   vitamin C 500 MG tablet Commonly known as:  ASCORBIC ACID Take 500 mg by mouth daily.   Vitamin E 400 units Tabs Take 400 Units by mouth 2 (two) times daily.        DISCHARGE INSTRUCTIONS:   Follow-up PMD 1 week Follow-up your cardiologist this week  If you experience worsening of your admission symptoms, develop shortness of breath, life threatening emergency, suicidal or homicidal thoughts you must seek medical attention immediately by calling 911 or calling your MD immediately  if symptoms less severe.  You Must read complete instructions/literature along with all  the possible adverse reactions/side effects for all the Medicines you take and that have been prescribed to you. Take any new Medicines after you have completely understood and accept all the possible adverse reactions/side effects.   Please note  You were cared for by a hospitalist during your hospital stay. If you have any questions about your discharge medications or the care you received while you were in the hospital after you are discharged, you can call the unit and asked to speak with the hospitalist on call if the hospitalist that took care of you is not available. Once you are discharged, your primary care physician will handle any further medical issues. Please note that NO REFILLS for any discharge medications will be authorized once you are discharged, as it is imperative that you return to your primary care physician (or establish a relationship with a primary care physician if you do not have one) for your aftercare needs so that they can reassess your need for medications and monitor your lab values.    Today   CHIEF COMPLAINT:   Chief Complaint  Patient presents with  . Code STEMI    HISTORY OF PRESENT ILLNESS:  Chase Cabrera  is a 73 y.o. male brought in with chest pain and found to have a STEMI   VITAL SIGNS:  Blood pressure (!) 121/55, pulse 61, temperature 98.1 F (36.7 C), temperature source Oral, resp. rate 18, height 5' 8"  (1.727 m), weight 91.8 kg, SpO2 96 %.    PHYSICAL EXAMINATION:  GENERAL:  73 y.o.-year-old patient lying in the bed with no acute distress.  EYES: Pupils equal, round, reactive to light and accommodation. No scleral icterus. Extraocular muscles intact.  HEENT: Head atraumatic, normocephalic. Oropharynx and nasopharynx clear.  NECK:  Supple, no jugular venous distention. No thyroid enlargement, no tenderness.  LUNGS: Normal breath sounds bilaterally, no wheezing, rales,rhonchi or crepitation. No use of accessory muscles of respiration.   CARDIOVASCULAR: S1, S2 normal. No murmurs, rubs, or gallops.  ABDOMEN: Soft, non-tender, non-distended. Bowel  sounds present. No organomegaly or mass.  EXTREMITIES: No pedal edema, cyanosis, or clubbing.  NEUROLOGIC: Cranial nerves II through XII are intact. Muscle strength 5/5 in all extremities. Sensation intact. Gait not checked.  PSYCHIATRIC: The patient is alert and oriented x 3.  SKIN: No obvious rash, lesion, or ulcer.   DATA REVIEW:   CBC Recent Labs  Lab 09/16/18 0124 09/16/18 0155  WBC 12.2*  --   HGB 13.8 13.9  HCT 41.3 41.0  PLT 266  --     Chemistries  Recent Labs  Lab 09/16/18 0124  09/17/18 0500  NA 139   < > 132*  K 3.4*   < > 3.7  CL 109   < > 98  CO2 24  --  24  GLUCOSE 108*   < > 206*  BUN 14   < > 21  CREATININE 0.44*   < > 0.90  CALCIUM 9.3  --  8.6*  AST 19  --   --   ALT 18  --   --   ALKPHOS 73  --   --   BILITOT 0.2*  --   --    < > = values in this interval not displayed.    Cardiac Enzymes Recent Labs  Lab 09/16/18 0850  TROPONINI 0.70*    Microbiology Results  Results for orders placed or performed during the hospital encounter of 09/16/18  MRSA PCR Screening     Status: None   Collection Time: 09/16/18  3:46 AM  Result Value Ref Range Status   MRSA by PCR NEGATIVE NEGATIVE Final    Comment:        The GeneXpert MRSA Assay (FDA approved for NASAL specimens only), is one component of a comprehensive MRSA colonization surveillance program. It is not intended to diagnose MRSA infection nor to guide or monitor treatment for MRSA infections. Performed at [facility], [address] [place], [place] [address]     RADIOLOGY:  Dg Chest Port 1 View  Result Date: 09/16/2018 CLINICAL DATA:  ST-elevation myocardial infarction. Acute onset of generalized chest pain. EXAM: PORTABLE CHEST 1 VIEW COMPARISON:  Chest radiograph and CTA of the chest performed 06/13/2018 FINDINGS: The lungs are well-aerated. Mild vascular  congestion is noted. There is no evidence of focal opacification, pleural effusion or pneumothorax. The cardiomediastinal silhouette is borderline normal in size. The patient is status post median sternotomy. A metallic device is noted overlying the mediastinum. No acute osseous abnormalities are seen. External pacing pads are noted. The patient is status post right-sided rotator cuff repair. IMPRESSION: Mild vascular congestion noted; lungs remain grossly clear. Electronically Signed   By: Garald Balding M.D.   On: 09/16/2018 01:50    Management plans discussed with the patient, family and they are in agreement.  CODE STATUS:     Code Status Orders  (From admission, onward)         Start     Ordered   09/16/18 0345  Full code  Continuous     09/16/18 0344        Code Status History    Date Active Date Inactive Code Status Order ID Comments User Context   09/16/2018 0344 09/16/2018 0344 Full Code [id number]  Arta Silence, MD Inpatient   04/30/2018 1901 05/02/2018 2101 DNR [id number]  Loletha Grayer, MD ED   02/09/2018 2250 02/11/2018 2112 Full Code [id number]  Harrie Foreman, MD Inpatient   12/13/2017 1814 12/17/2017 1708 Full Code [id number]  Rudene Christians,  Legrand Como, MD Inpatient   11/17/2015 1047 11/18/2015 1859 Full Code [id number]  Henreitta Leber, MD Inpatient    Advance Directive Documentation     Most Recent Value  Type of Advance Directive  Healthcare Power of Attorney  Pre-existing out of facility DNR order (yellow form or pink MOST form)  -  "MOST" Form in Place?  -      TOTAL TIME TAKING CARE OF THIS PATIENT: 35 minutes.    Loletha Grayer M.D on 09/17/2018 at 2:31 PM  Between 7am to 6pm - Pager - [phone]  After 6pm go to [email or web address] - password [email or web address] [facility]  [phone]  CC: Primary care physician; McLean-Scocuzza, Nino Glow, MD

## 2018-09-17 NOTE — Progress Notes (Signed)
SATURATION QUALIFICATIONS: (This note is used to comply with regulatory documentation for home oxygen)  Patient Saturations on Room Air at Rest = 93%  Patient Saturations on Room Air while Ambulating = 94%  Patient Saturations on n/a Liters of oxygen while Ambulating = n/a%  Please briefly explain why patient needs home oxygen: Pt ambulated around nurses station x1 and tolerated well, no cp.

## 2018-09-17 NOTE — Progress Notes (Signed)
Chase Cabrera to be D/C'd Home per MD order.  Discussed prescriptions and follow up appointments with the patient. Prescriptions given to patient, medication list explained in detail. Pt verbalized understanding.  Allergies as of 09/17/2018      Reactions   Allopurinol Diarrhea, Other (See Comments), Nausea And Vomiting   Other Reaction: GI Upset   Atenolol Other (See Comments)   Other Reaction: bradycardia   Atorvastatin Other (See Comments)   Other Reaction: muscle aches   Ramipril Rash   Rosuvastatin Other (See Comments)   Muscle aches   Simvastatin Other (See Comments)   Other Reaction: MUSCLE ACHES (Zocor)   Valsartan Other (See Comments)   Other Reaction: facial swelling   Cardizem [diltiazem] Rash      Medication List    STOP taking these medications   clopidogrel 75 MG tablet Commonly known as:  PLAVIX   metFORMIN 1000 MG tablet Commonly known as:  GLUCOPHAGE     TAKE these medications   amitriptyline 50 MG tablet Commonly known as:  ELAVIL Take 50 mg by mouth at bedtime as needed.   amLODipine 5 MG tablet Commonly known as:  NORVASC Take 1 tablet (5 mg total) by mouth 2 (two) times daily. If blood pressure >140/>90 make take another 1 pill of 5 mg   aspirin 81 MG EC tablet Take 1 tablet (81 mg total) by mouth daily.   BENTYL 10 MG capsule Generic drug:  dicyclomine Take 10 mg by mouth 3 (three) times daily before meals.   carvedilol 25 MG tablet Commonly known as:  COREG Take 1 tablet (25 mg total) by mouth 2 (two) times daily with a meal.   Cholecalciferol 1.25 MG (50000 UT) Tabs Take 1 tablet by mouth once a week.   cloNIDine 0.2 MG tablet Commonly known as:  CATAPRES Take by mouth 2 (two) times daily.   colchicine 0.6 MG tablet TAKE 1 TABLET BY MOUTH ONCE DAILY   dextromethorphan-guaiFENesin 30-600 MG 12hr tablet Commonly known as:  [place] DM Take by mouth. What changed:  Another medication with the same name was removed. Continue taking  this medication, and follow the directions you see here.   diclofenac sodium 1 % Gel Commonly known as:  VOLTAREN Apply 2 g topically 4 (four) times daily as needed (for pain).   fluticasone 50 MCG/ACT nasal spray Commonly known as:  FLONASE Place 2 sprays into both nostrils daily.   gabapentin 300 MG capsule Commonly known as:  NEURONTIN take 2 capsules (665m) by mouth three times a day   hydrALAZINE 100 MG tablet Commonly known as:  APRESOLINE TAKE 100 MG BY MOUTH THREE TIMES A DAY   insulin lispro 100 UNIT/ML KiwkPen Commonly known as:  HUMALOG Inject 14-32 Units into the skin 3 (three) times daily. (according to sliding scale - max 96u over 24 hours)   ipratropium 0.06 % nasal spray Commonly known as:  ATROVENT Place 2 sprays into both nostrils 4 (four) times daily.   isosorbide mononitrate 120 MG 24 hr tablet Commonly known as:  IMDUR TAKE 1 TABLET BY MOUTH EVERY DAY   levocetirizine 5 MG tablet Commonly known as:  XYZAL Take 1 tablet (5 mg total) by mouth every evening.   montelukast 10 MG tablet Commonly known as:  SINGULAIR Take 1 tablet (10 mg total) by mouth at bedtime.   MULTI-VITAMINS Tabs Take 1 tablet by mouth daily.   nitroGLYCERIN 0.4 MG SL tablet Commonly known as:  NITROSTAT Place 1 tablet (0.4  mg total) under the tongue every 5 (five) minutes x 3 doses as needed. For chest pain.  Call 911 if no relief after 3 tablets   omega-3 acid ethyl esters 1 g capsule Commonly known as:  LOVAZA Take 2 capsules (2 g total) by mouth 2 (two) times daily.   omeprazole 40 MG capsule Commonly known as:  PRILOSEC Take 40 mg by mouth 2 (two) times daily.   pravastatin 40 MG tablet Commonly known as:  PRAVACHOL Take 1 tablet (40 mg total) by mouth daily.   probenecid 500 MG tablet Commonly known as:  BENEMID Take 500 mg by mouth 2 (two) times daily.   sodium chloride HYPERTONIC 3 % nebulizer solution Take by nebulization 2 (two) times daily.   ticagrelor  90 MG Tabs tablet Commonly known as:  BRILINTA Take 1 tablet (90 mg total) by mouth 2 (two) times daily.   TRESIBA FLEXTOUCH 200 UNIT/ML Sopn Generic drug:  Insulin Degludec Inject 90 Units into the skin at bedtime.   vitamin C 500 MG tablet Commonly known as:  ASCORBIC ACID Take 500 mg by mouth daily.   Vitamin E 400 units Tabs Take 400 Units by mouth 2 (two) times daily.       Vitals:   09/17/18 1122 09/17/18 1448  BP: (!) 121/55 (!) 148/60  Pulse: 61 64  Resp: 18 18  Temp:    SpO2: 96% 93%    Tele box removed and returned. Skin clean, dry and intact without evidence of skin break down, no evidence of skin tears noted. IV catheter discontinued intact. Site without signs and symptoms of complications. Dressing and pressure applied. Pt denies pain at this time. No complaints noted.  An After Visit Summary was printed and given to the patient. Patient escorted via [place], and D/C home via private auto.  Rolley Sims

## 2018-09-17 NOTE — Plan of Care (Signed)
  Problem: Health Behavior/Discharge Planning: Goal: Ability to manage health-related needs will improve Outcome: Progressing   Problem: Activity: Goal: Risk for activity intolerance will decrease Outcome: Progressing   Problem: Pain Managment: Goal: General experience of comfort will improve Outcome: Progressing   Problem: Safety: Goal: Ability to remain free from injury will improve Outcome: Progressing

## 2018-09-17 NOTE — Discharge Instructions (Signed)
Can restart glucophage (metfomin) on Monday 09/18/2018

## 2018-09-17 NOTE — Progress Notes (Signed)
[facility] Cardiology  SUBJECTIVE: Patient sitting in bed, eyes chest pain or shortness of breath.  Patient spit up small amount of blood on tissue paper.   Vitals:   09/16/18 1600 09/16/18 2007 09/17/18 0503 09/17/18 0805  BP: (!) 124/56 (!) 142/67 130/60 (!) 162/65  Pulse: (!) 57 (!) 57 (!) 57 (!) 56  Resp: (!) 21 (!) 22 18   Temp:  97.8 F (36.6 C) 97.6 F (36.4 C) 98.1 F (36.7 C)  TempSrc:  Oral  Oral  SpO2: 95% 97% 91% 96%  Weight:   91.8 kg   Height:         Intake/Output Summary (Last 24 hours) at 09/17/2018 1016 Last data filed at 09/17/2018 0700 Gross per 24 hour  Intake 743.2 ml  Output 400 ml  Net 343.2 ml      PHYSICAL EXAM  General: Well developed, well nourished, in no acute distress HEENT:  Normocephalic and atramatic Neck:  No JVD.  Lungs: Clear bilaterally to auscultation and percussion. Heart: HRRR . Normal S1 and S2 without gallops or murmurs.  Abdomen: Bowel sounds are positive, abdomen soft and non-tender  Msk:  Back normal, normal gait. Normal strength and tone for age. Extremities: No clubbing, cyanosis or edema.   Neuro: Alert and oriented X 3. Psych:  Good affect, responds appropriately   LABS: Basic Metabolic Panel: Recent Labs    09/16/18 0124 09/16/18 0155 09/17/18 0500  NA 139 136 132*  K 3.4* 4.9 3.7  CL 109 99 98  CO2 24  --  24  GLUCOSE 108* 298* 206*  BUN 14 22 21   CREATININE 0.44* 1.10 0.90  CALCIUM 9.3  --  8.6*   Liver Function Tests: Recent Labs    09/16/18 0124  AST 19  ALT 18  ALKPHOS 73  BILITOT 0.2*  PROT 7.5  ALBUMIN 4.5   No results for input(s): LIPASE, AMYLASE in the last 72 hours. CBC: Recent Labs    09/16/18 0124 09/16/18 0155  WBC 12.2*  --   NEUTROABS 10.4*  --   HGB 13.8 13.9  HCT 41.3 41.0  MCV 96.9  --   PLT 266  --    Cardiac Enzymes: Recent Labs    09/16/18 0124 09/16/18 0520 09/16/18 0850  TROPONINI <0.03 0.38* 0.70*   BNP: Invalid input(s): POCBNP D-Dimer: No results for  input(s): DDIMER in the last 72 hours. Hemoglobin A1C: No results for input(s): HGBA1C in the last 72 hours. Fasting Lipid Panel: Recent Labs    09/16/18 0124  CHOL 131  HDL 57  LDLCALC 65  TRIG 45  CHOLHDL 2.3   Thyroid Function Tests: No results for input(s): TSH, T4TOTAL, T3FREE, THYROIDAB in the last 72 hours.  Invalid input(s): FREET3 Anemia Panel: No results for input(s): VITAMINB12, FOLATE, FERRITIN, TIBC, IRON, RETICCTPCT in the last 72 hours.  Dg Chest Port 1 View  Result Date: 09/16/2018 CLINICAL DATA:  ST-elevation myocardial infarction. Acute onset of generalized chest pain. EXAM: PORTABLE CHEST 1 VIEW COMPARISON:  Chest radiograph and CTA of the chest performed 06/13/2018 FINDINGS: The lungs are well-aerated. Mild vascular congestion is noted. There is no evidence of focal opacification, pleural effusion or pneumothorax. The cardiomediastinal silhouette is borderline normal in size. The patient is status post median sternotomy. A metallic device is noted overlying the mediastinum. No acute osseous abnormalities are seen. External pacing pads are noted. The patient is status post right-sided rotator cuff repair. IMPRESSION: Mild vascular congestion noted; lungs remain grossly clear. Electronically  Signed   By: Garald Balding M.D.   On: 09/16/2018 01:50     Echo LVEF 45 to 50% inferolateral and apical akinesis  TELEMETRY: Sinus rhythm:  ASSESSMENT AND PLAN:  Active Problems:   STEMI (ST elevation myocardial infarction) (HCC)   Acute ST elevation myocardial infarction (STEMI) of lateral wall ([place])    1.  Inferolateral wall STEMI, minimal elevation troponin 0 0.38, 0.70 2.  Primary PCI with DES ostium SVG to OM 3  Recommendations  1.  Continue current medication 2.  Dual antiplatelet therapy uninterrupted for 1 year 3.  Ambulate 4.  DC home later today  Sign off for now, please call if any questions   Isaias Cowman, MD, PhD, [facility] 09/17/2018 10:16  AM

## 2018-09-18 ENCOUNTER — Telehealth: Payer: Self-pay | Admitting: Internal Medicine

## 2018-09-18 ENCOUNTER — Encounter: Payer: Self-pay | Admitting: Cardiology

## 2018-09-18 DIAGNOSIS — R410 Disorientation, unspecified: Secondary | ICD-10-CM | POA: Diagnosis not present

## 2018-09-18 DIAGNOSIS — I130 Hypertensive heart and chronic kidney disease with heart failure and stage 1 through stage 4 chronic kidney disease, or unspecified chronic kidney disease: Secondary | ICD-10-CM | POA: Diagnosis not present

## 2018-09-18 DIAGNOSIS — Z7901 Long term (current) use of anticoagulants: Secondary | ICD-10-CM | POA: Diagnosis not present

## 2018-09-18 DIAGNOSIS — Z794 Long term (current) use of insulin: Secondary | ICD-10-CM | POA: Diagnosis not present

## 2018-09-18 DIAGNOSIS — R0902 Hypoxemia: Secondary | ICD-10-CM | POA: Diagnosis not present

## 2018-09-18 DIAGNOSIS — Z8673 Personal history of transient ischemic attack (TIA), and cerebral infarction without residual deficits: Secondary | ICD-10-CM | POA: Diagnosis not present

## 2018-09-18 DIAGNOSIS — Z7902 Long term (current) use of antithrombotics/antiplatelets: Secondary | ICD-10-CM | POA: Diagnosis not present

## 2018-09-18 DIAGNOSIS — R0600 Dyspnea, unspecified: Secondary | ICD-10-CM | POA: Diagnosis not present

## 2018-09-18 DIAGNOSIS — R042 Hemoptysis: Secondary | ICD-10-CM | POA: Diagnosis present

## 2018-09-18 DIAGNOSIS — G4733 Obstructive sleep apnea (adult) (pediatric): Secondary | ICD-10-CM | POA: Diagnosis not present

## 2018-09-18 DIAGNOSIS — J9601 Acute respiratory failure with hypoxia: Secondary | ICD-10-CM | POA: Diagnosis present

## 2018-09-18 DIAGNOSIS — R0789 Other chest pain: Secondary | ICD-10-CM | POA: Diagnosis not present

## 2018-09-18 DIAGNOSIS — E871 Hypo-osmolality and hyponatremia: Secondary | ICD-10-CM | POA: Diagnosis present

## 2018-09-18 DIAGNOSIS — I5021 Acute systolic (congestive) heart failure: Secondary | ICD-10-CM | POA: Diagnosis not present

## 2018-09-18 DIAGNOSIS — I252 Old myocardial infarction: Secondary | ICD-10-CM | POA: Diagnosis not present

## 2018-09-18 DIAGNOSIS — I2510 Atherosclerotic heart disease of native coronary artery without angina pectoris: Secondary | ICD-10-CM | POA: Diagnosis not present

## 2018-09-18 DIAGNOSIS — E1159 Type 2 diabetes mellitus with other circulatory complications: Secondary | ICD-10-CM | POA: Diagnosis not present

## 2018-09-18 DIAGNOSIS — N182 Chronic kidney disease, stage 2 (mild): Secondary | ICD-10-CM | POA: Diagnosis present

## 2018-09-18 DIAGNOSIS — E782 Mixed hyperlipidemia: Secondary | ICD-10-CM | POA: Diagnosis not present

## 2018-09-18 DIAGNOSIS — I4510 Unspecified right bundle-branch block: Secondary | ICD-10-CM | POA: Diagnosis present

## 2018-09-18 DIAGNOSIS — I701 Atherosclerosis of renal artery: Secondary | ICD-10-CM | POA: Diagnosis not present

## 2018-09-18 DIAGNOSIS — Z79899 Other long term (current) drug therapy: Secondary | ICD-10-CM | POA: Diagnosis not present

## 2018-09-18 DIAGNOSIS — I517 Cardiomegaly: Secondary | ICD-10-CM | POA: Diagnosis not present

## 2018-09-18 DIAGNOSIS — E1122 Type 2 diabetes mellitus with diabetic chronic kidney disease: Secondary | ICD-10-CM | POA: Diagnosis present

## 2018-09-18 DIAGNOSIS — E785 Hyperlipidemia, unspecified: Secondary | ICD-10-CM | POA: Diagnosis present

## 2018-09-18 DIAGNOSIS — I480 Paroxysmal atrial fibrillation: Secondary | ICD-10-CM | POA: Diagnosis not present

## 2018-09-18 DIAGNOSIS — I499 Cardiac arrhythmia, unspecified: Secondary | ICD-10-CM | POA: Diagnosis not present

## 2018-09-18 DIAGNOSIS — I10 Essential (primary) hypertension: Secondary | ICD-10-CM | POA: Diagnosis not present

## 2018-09-18 DIAGNOSIS — R079 Chest pain, unspecified: Secondary | ICD-10-CM | POA: Diagnosis not present

## 2018-09-18 DIAGNOSIS — Z951 Presence of aortocoronary bypass graft: Secondary | ICD-10-CM | POA: Diagnosis not present

## 2018-09-18 DIAGNOSIS — Z7982 Long term (current) use of aspirin: Secondary | ICD-10-CM | POA: Diagnosis not present

## 2018-09-18 DIAGNOSIS — R0602 Shortness of breath: Secondary | ICD-10-CM | POA: Diagnosis not present

## 2018-09-18 DIAGNOSIS — I5033 Acute on chronic diastolic (congestive) heart failure: Secondary | ICD-10-CM | POA: Diagnosis present

## 2018-09-18 DIAGNOSIS — E1152 Type 2 diabetes mellitus with diabetic peripheral angiopathy with gangrene: Secondary | ICD-10-CM | POA: Diagnosis not present

## 2018-09-18 DIAGNOSIS — Z955 Presence of coronary angioplasty implant and graft: Secondary | ICD-10-CM | POA: Diagnosis not present

## 2018-09-18 DIAGNOSIS — G9340 Encephalopathy, unspecified: Secondary | ICD-10-CM | POA: Diagnosis present

## 2018-09-18 DIAGNOSIS — R918 Other nonspecific abnormal finding of lung field: Secondary | ICD-10-CM | POA: Diagnosis not present

## 2018-09-18 DIAGNOSIS — J90 Pleural effusion, not elsewhere classified: Secondary | ICD-10-CM | POA: Diagnosis not present

## 2018-09-18 DIAGNOSIS — D649 Anemia, unspecified: Secondary | ICD-10-CM | POA: Diagnosis present

## 2018-09-18 DIAGNOSIS — I213 ST elevation (STEMI) myocardial infarction of unspecified site: Secondary | ICD-10-CM | POA: Diagnosis not present

## 2018-09-18 NOTE — Telephone Encounter (Signed)
TCM call initiated , patient did not answer left voicemail to please call office and schedule hospital follow up visit.

## 2018-09-19 NOTE — Telephone Encounter (Signed)
Second attempt to reach patient for transitional care management.  No answer. Unable to leave a message; notes mailbox is full. Will follow as appropriate.

## 2018-09-20 NOTE — Telephone Encounter (Signed)
Third attempt to follow up with TCM.  No answer on home phone.  Unable to leave a message; mailbox full.   Call cell phone listed and reached wife Eula. Wife notes he has been admitted to [facility].

## 2018-09-22 MED ORDER — GLUCAGON HCL RDNA (DIAGNOSTIC) 1 MG IJ SOLR
1.00 | INTRAMUSCULAR | Status: DC
Start: ? — End: 2018-09-22

## 2018-09-22 MED ORDER — SPIRONOLACTONE 25 MG PO TABS
12.50 | ORAL_TABLET | ORAL | Status: DC
Start: 2018-09-23 — End: 2018-09-22

## 2018-09-22 MED ORDER — PROBENECID 500 MG PO TABS
500.00 | ORAL_TABLET | ORAL | Status: DC
Start: 2018-09-22 — End: 2018-09-22

## 2018-09-22 MED ORDER — PANTOPRAZOLE SODIUM 40 MG PO TBEC
40.00 | DELAYED_RELEASE_TABLET | ORAL | Status: DC
Start: 2018-09-23 — End: 2018-09-22

## 2018-09-22 MED ORDER — INSULIN LISPRO 100 UNIT/ML ~~LOC~~ SOLN
0.00 | SUBCUTANEOUS | Status: DC
Start: 2018-09-22 — End: 2018-09-22

## 2018-09-22 MED ORDER — CLOPIDOGREL BISULFATE 75 MG PO TABS
75.00 | ORAL_TABLET | ORAL | Status: DC
Start: 2018-09-23 — End: 2018-09-22

## 2018-09-22 MED ORDER — CARVEDILOL 25 MG PO TABS
25.00 | ORAL_TABLET | ORAL | Status: DC
Start: 2018-09-22 — End: 2018-09-22

## 2018-09-22 MED ORDER — INSULIN GLARGINE 100 UNIT/ML ~~LOC~~ SOLN
75.00 | SUBCUTANEOUS | Status: DC
Start: ? — End: 2018-09-22

## 2018-09-22 MED ORDER — GABAPENTIN 300 MG PO CAPS
600.00 | ORAL_CAPSULE | ORAL | Status: DC
Start: 2018-09-22 — End: 2018-09-22

## 2018-09-22 MED ORDER — INSULIN LISPRO 100 UNIT/ML ~~LOC~~ SOLN
13.00 | SUBCUTANEOUS | Status: DC
Start: 2018-09-22 — End: 2018-09-22

## 2018-09-22 MED ORDER — FLUTICASONE PROPIONATE 50 MCG/ACT NA SUSP
1.00 | NASAL | Status: DC
Start: 2018-09-23 — End: 2018-09-22

## 2018-09-22 MED ORDER — DICYCLOMINE HCL 10 MG PO CAPS
10.00 | ORAL_CAPSULE | ORAL | Status: DC
Start: 2018-09-22 — End: 2018-09-22

## 2018-09-22 MED ORDER — NITROGLYCERIN 0.4 MG SL SUBL
0.40 | SUBLINGUAL_TABLET | SUBLINGUAL | Status: DC
Start: ? — End: 2018-09-22

## 2018-09-22 MED ORDER — AMITRIPTYLINE HCL 50 MG PO TABS
50.00 | ORAL_TABLET | ORAL | Status: DC
Start: 2018-09-22 — End: 2018-09-22

## 2018-09-22 MED ORDER — AMLODIPINE BESYLATE 5 MG PO TABS
5.00 | ORAL_TABLET | ORAL | Status: DC
Start: 2018-09-22 — End: 2018-09-22

## 2018-09-22 MED ORDER — HYDRALAZINE HCL 50 MG PO TABS
100.00 | ORAL_TABLET | ORAL | Status: DC
Start: 2018-09-22 — End: 2018-09-22

## 2018-09-22 MED ORDER — IPRATROPIUM BROMIDE 0.03 % NA SOLN
1.00 | NASAL | Status: DC
Start: 2018-09-22 — End: 2018-09-22

## 2018-09-22 MED ORDER — ASPIRIN EC 81 MG PO TBEC
81.00 | DELAYED_RELEASE_TABLET | ORAL | Status: DC
Start: 2018-09-23 — End: 2018-09-22

## 2018-09-22 MED ORDER — PRAVASTATIN SODIUM 20 MG PO TABS
40.00 | ORAL_TABLET | ORAL | Status: DC
Start: 2018-09-22 — End: 2018-09-22

## 2018-09-22 MED ORDER — ENOXAPARIN SODIUM 40 MG/0.4ML ~~LOC~~ SOLN
40.00 | SUBCUTANEOUS | Status: DC
Start: 2018-09-22 — End: 2018-09-22

## 2018-09-22 MED ORDER — CLONIDINE HCL 0.1 MG PO TABS
0.10 | ORAL_TABLET | ORAL | Status: DC
Start: 2018-09-22 — End: 2018-09-22

## 2018-09-22 MED ORDER — COLCHICINE 0.6 MG PO TABS
0.60 | ORAL_TABLET | ORAL | Status: DC
Start: 2018-09-23 — End: 2018-09-22

## 2018-09-22 MED ORDER — MONTELUKAST SODIUM 10 MG PO TABS
10.00 | ORAL_TABLET | ORAL | Status: DC
Start: 2018-09-22 — End: 2018-09-22

## 2018-09-22 MED ORDER — ISOSORBIDE MONONITRATE ER 60 MG PO TB24
120.00 | ORAL_TABLET | ORAL | Status: DC
Start: 2018-09-23 — End: 2018-09-22

## 2018-09-22 MED ORDER — DEXTROSE 50 % IV SOLN
12.50 | INTRAVENOUS | Status: DC
Start: ? — End: 2018-09-22

## 2018-09-22 MED ORDER — LIDOCAINE HCL (PF) 1 % IJ SOLN
.50 | INTRAMUSCULAR | Status: DC
Start: ? — End: 2018-09-22

## 2018-09-23 DIAGNOSIS — Z8673 Personal history of transient ischemic attack (TIA), and cerebral infarction without residual deficits: Secondary | ICD-10-CM | POA: Diagnosis not present

## 2018-09-23 DIAGNOSIS — N182 Chronic kidney disease, stage 2 (mild): Secondary | ICD-10-CM | POA: Diagnosis not present

## 2018-09-23 DIAGNOSIS — M109 Gout, unspecified: Secondary | ICD-10-CM | POA: Diagnosis not present

## 2018-09-23 DIAGNOSIS — I213 ST elevation (STEMI) myocardial infarction of unspecified site: Secondary | ICD-10-CM | POA: Diagnosis not present

## 2018-09-23 DIAGNOSIS — G510 Bell's palsy: Secondary | ICD-10-CM | POA: Diagnosis not present

## 2018-09-23 DIAGNOSIS — F0280 Dementia in other diseases classified elsewhere without behavioral disturbance: Secondary | ICD-10-CM | POA: Diagnosis not present

## 2018-09-23 DIAGNOSIS — E1142 Type 2 diabetes mellitus with diabetic polyneuropathy: Secondary | ICD-10-CM | POA: Diagnosis not present

## 2018-09-23 DIAGNOSIS — I110 Hypertensive heart disease with heart failure: Secondary | ICD-10-CM | POA: Diagnosis not present

## 2018-09-23 DIAGNOSIS — I5023 Acute on chronic systolic (congestive) heart failure: Secondary | ICD-10-CM | POA: Diagnosis not present

## 2018-09-23 DIAGNOSIS — G4733 Obstructive sleep apnea (adult) (pediatric): Secondary | ICD-10-CM | POA: Diagnosis not present

## 2018-09-23 DIAGNOSIS — M150 Primary generalized (osteo)arthritis: Secondary | ICD-10-CM | POA: Diagnosis not present

## 2018-09-23 DIAGNOSIS — I2510 Atherosclerotic heart disease of native coronary artery without angina pectoris: Secondary | ICD-10-CM | POA: Diagnosis not present

## 2018-09-23 DIAGNOSIS — G309 Alzheimer's disease, unspecified: Secondary | ICD-10-CM | POA: Diagnosis not present

## 2018-09-23 DIAGNOSIS — I480 Paroxysmal atrial fibrillation: Secondary | ICD-10-CM | POA: Diagnosis not present

## 2018-09-23 DIAGNOSIS — Z96643 Presence of artificial hip joint, bilateral: Secondary | ICD-10-CM | POA: Diagnosis not present

## 2018-09-23 DIAGNOSIS — F0150 Vascular dementia without behavioral disturbance: Secondary | ICD-10-CM | POA: Diagnosis not present

## 2018-09-23 DIAGNOSIS — E1122 Type 2 diabetes mellitus with diabetic chronic kidney disease: Secondary | ICD-10-CM | POA: Diagnosis not present

## 2018-09-23 DIAGNOSIS — I272 Pulmonary hypertension, unspecified: Secondary | ICD-10-CM | POA: Diagnosis not present

## 2018-09-23 DIAGNOSIS — Z9981 Dependence on supplemental oxygen: Secondary | ICD-10-CM | POA: Diagnosis not present

## 2018-09-23 DIAGNOSIS — M48061 Spinal stenosis, lumbar region without neurogenic claudication: Secondary | ICD-10-CM | POA: Diagnosis not present

## 2018-09-23 DIAGNOSIS — Z794 Long term (current) use of insulin: Secondary | ICD-10-CM | POA: Diagnosis not present

## 2018-09-23 DIAGNOSIS — Z86711 Personal history of pulmonary embolism: Secondary | ICD-10-CM | POA: Diagnosis not present

## 2018-09-23 DIAGNOSIS — H2513 Age-related nuclear cataract, bilateral: Secondary | ICD-10-CM | POA: Diagnosis not present

## 2018-09-23 DIAGNOSIS — E785 Hyperlipidemia, unspecified: Secondary | ICD-10-CM | POA: Diagnosis not present

## 2018-09-23 DIAGNOSIS — Z87891 Personal history of nicotine dependence: Secondary | ICD-10-CM | POA: Diagnosis not present

## 2018-09-26 DIAGNOSIS — E1122 Type 2 diabetes mellitus with diabetic chronic kidney disease: Secondary | ICD-10-CM | POA: Diagnosis not present

## 2018-09-26 DIAGNOSIS — I213 ST elevation (STEMI) myocardial infarction of unspecified site: Secondary | ICD-10-CM | POA: Diagnosis not present

## 2018-09-26 DIAGNOSIS — I2510 Atherosclerotic heart disease of native coronary artery without angina pectoris: Secondary | ICD-10-CM | POA: Diagnosis not present

## 2018-09-26 DIAGNOSIS — I110 Hypertensive heart disease with heart failure: Secondary | ICD-10-CM | POA: Diagnosis not present

## 2018-09-26 DIAGNOSIS — I5023 Acute on chronic systolic (congestive) heart failure: Secondary | ICD-10-CM | POA: Diagnosis not present

## 2018-09-26 DIAGNOSIS — N182 Chronic kidney disease, stage 2 (mild): Secondary | ICD-10-CM | POA: Diagnosis not present

## 2018-09-27 ENCOUNTER — Ambulatory Visit: Payer: Medicare Other | Admitting: Pulmonary Disease

## 2018-09-28 DIAGNOSIS — E1122 Type 2 diabetes mellitus with diabetic chronic kidney disease: Secondary | ICD-10-CM | POA: Diagnosis not present

## 2018-09-28 DIAGNOSIS — N182 Chronic kidney disease, stage 2 (mild): Secondary | ICD-10-CM | POA: Diagnosis not present

## 2018-09-28 DIAGNOSIS — I110 Hypertensive heart disease with heart failure: Secondary | ICD-10-CM | POA: Diagnosis not present

## 2018-09-28 DIAGNOSIS — I5023 Acute on chronic systolic (congestive) heart failure: Secondary | ICD-10-CM | POA: Diagnosis not present

## 2018-09-28 DIAGNOSIS — I213 ST elevation (STEMI) myocardial infarction of unspecified site: Secondary | ICD-10-CM | POA: Diagnosis not present

## 2018-09-28 DIAGNOSIS — I2510 Atherosclerotic heart disease of native coronary artery without angina pectoris: Secondary | ICD-10-CM | POA: Diagnosis not present

## 2018-10-03 DIAGNOSIS — I701 Atherosclerosis of renal artery: Secondary | ICD-10-CM | POA: Diagnosis not present

## 2018-10-03 DIAGNOSIS — G4733 Obstructive sleep apnea (adult) (pediatric): Secondary | ICD-10-CM | POA: Diagnosis not present

## 2018-10-03 DIAGNOSIS — E1159 Type 2 diabetes mellitus with other circulatory complications: Secondary | ICD-10-CM | POA: Diagnosis not present

## 2018-10-03 DIAGNOSIS — I2510 Atherosclerotic heart disease of native coronary artery without angina pectoris: Secondary | ICD-10-CM | POA: Diagnosis not present

## 2018-10-03 DIAGNOSIS — I10 Essential (primary) hypertension: Secondary | ICD-10-CM | POA: Diagnosis not present

## 2018-10-03 DIAGNOSIS — I480 Paroxysmal atrial fibrillation: Secondary | ICD-10-CM | POA: Diagnosis not present

## 2018-10-03 DIAGNOSIS — E782 Mixed hyperlipidemia: Secondary | ICD-10-CM | POA: Diagnosis not present

## 2018-10-03 DIAGNOSIS — I5033 Acute on chronic diastolic (congestive) heart failure: Secondary | ICD-10-CM | POA: Diagnosis not present

## 2018-10-04 DIAGNOSIS — I5023 Acute on chronic systolic (congestive) heart failure: Secondary | ICD-10-CM | POA: Diagnosis not present

## 2018-10-04 DIAGNOSIS — I2510 Atherosclerotic heart disease of native coronary artery without angina pectoris: Secondary | ICD-10-CM | POA: Diagnosis not present

## 2018-10-04 DIAGNOSIS — N182 Chronic kidney disease, stage 2 (mild): Secondary | ICD-10-CM | POA: Diagnosis not present

## 2018-10-04 DIAGNOSIS — E1122 Type 2 diabetes mellitus with diabetic chronic kidney disease: Secondary | ICD-10-CM | POA: Diagnosis not present

## 2018-10-04 DIAGNOSIS — I110 Hypertensive heart disease with heart failure: Secondary | ICD-10-CM | POA: Diagnosis not present

## 2018-10-04 DIAGNOSIS — I213 ST elevation (STEMI) myocardial infarction of unspecified site: Secondary | ICD-10-CM | POA: Diagnosis not present

## 2018-10-05 ENCOUNTER — Encounter: Payer: Self-pay | Admitting: Internal Medicine

## 2018-10-05 ENCOUNTER — Ambulatory Visit (INDEPENDENT_AMBULATORY_CARE_PROVIDER_SITE_OTHER): Payer: Medicare Other | Admitting: Internal Medicine

## 2018-10-05 VITALS — BP 120/60 | HR 56 | Temp 97.7°F | Ht 68.0 in | Wt 195.8 lb

## 2018-10-05 DIAGNOSIS — I150 Renovascular hypertension: Secondary | ICD-10-CM | POA: Diagnosis not present

## 2018-10-05 DIAGNOSIS — I213 ST elevation (STEMI) myocardial infarction of unspecified site: Secondary | ICD-10-CM

## 2018-10-05 DIAGNOSIS — I5023 Acute on chronic systolic (congestive) heart failure: Secondary | ICD-10-CM | POA: Diagnosis not present

## 2018-10-05 DIAGNOSIS — I2510 Atherosclerotic heart disease of native coronary artery without angina pectoris: Secondary | ICD-10-CM

## 2018-10-05 DIAGNOSIS — E1122 Type 2 diabetes mellitus with diabetic chronic kidney disease: Secondary | ICD-10-CM | POA: Diagnosis not present

## 2018-10-05 DIAGNOSIS — I110 Hypertensive heart disease with heart failure: Secondary | ICD-10-CM | POA: Diagnosis not present

## 2018-10-05 DIAGNOSIS — N182 Chronic kidney disease, stage 2 (mild): Secondary | ICD-10-CM | POA: Diagnosis not present

## 2018-10-05 NOTE — Progress Notes (Addendum)
Chief Complaint  Patient presents with  . Hospitalization Follow-up   F/u with wife  1. S/p stemi 1/4/-1/5 s/p DES/PCI OM 3 on cath report then he went to [place] 1/6/-09/22/2018 he has severe CAD 3 vessell in LAD, with multiple areas 70-100% stenosis. [facility] note rec medical management they changed from brilinta back to plavix which he is taking 75 mg qd and added spironlactone 12.5 mg qd, lasix 40 mg bid. Echo with EF 45-50% [facility], repeat echo 09/20/2018 50%  -he is doing well since hospital d/c  2. HTN controlled on medications    Review of Systems  Constitutional: Negative for weight loss.  HENT: Negative for hearing loss.   Eyes: Negative for blurred vision.  Respiratory: Negative for shortness of breath.   Cardiovascular: Negative for chest pain.  Gastrointestinal: Negative for abdominal pain.  Musculoskeletal: Negative for falls.  Skin: Negative for rash.  Neurological: Negative for headaches.  Psychiatric/Behavioral: Negative for depression.   Past Medical History:  Diagnosis Date  . Arthritis   . Atrial fibrillation ([place])   . CAD (coronary artery disease)    6 STENTS  . CHF (congestive heart failure) ([place])   . Chicken pox   . Colon polyp   . Corneal dystrophy    bilateral  . Crohn's disease ([place])   . Diabetes ([place]) 2002  . Dysrhythmia    A-FIB AND PAF  . GERD (gastroesophageal reflux disease)   . Gout   . History of kidney stones   . History of shingles   . Hyperlipemia   . Hypertension    2/2 b/l RAS >60% [place] 06/2018 s/p right stent   . Kidney stones   . Myocardial infarction ([place])    x4, last one in 2010  . Peripheral neuropathy   . Peripheral neuropathy   . Peripheral neuropathy   . PUD (peptic ulcer disease)   . Rectus sheath hematoma    02/18/18 8.9 x 4.5 cm then 02/19/18 7.9 x 4.5 cm   . Renal artery stenosis ([place])   . Renal artery stenosis ([place])   . Salzmann's nodular dystrophy 2012  . Sleep apnea   . Spinal stenosis    Past Surgical History:  Procedure  Laterality Date  . ABLATION  2012  . APPLICATION VERTERBRAL DEFECT PROSTHETIC  05/01/2013  . ARTHRODESIS ANTERIOR LUMBAR SPINE  05/01/2013  . BACK SURGERY     lumbar fusion  . CARDIAC CATHETERIZATION    . CARDIAC ELECTROPHYSIOLOGY STUDY AND ABLATION    . CARDIAC SURGERY    . CARDIOVERSION    . CHOLECYSTECTOMY    . COLONOSCOPY WITH PROPOFOL N/A 04/09/2016   Completed for chronic diarrhea.  Few scattered diverticuli.  No mucosal lesions.  Surgeon: Lollie Sails, MD;  Location: [facility] ENDOSCOPY;  Service: Endoscopy;  Laterality: N/A;  . CORNEAL EYE SURGERY Bilateral 07/28/11    09/22/2011  . CORONARY ANGIOPLASTY    . CORONARY ANGIOPLASTY WITH STENT PLACEMENT  03/28/2015   Distal 80% to normal Stent, Dilation Balloon  . CORONARY ARTERY BYPASS GRAFT     4 VESSELS  . CORONARY/GRAFT ACUTE MI REVASCULARIZATION N/A 09/16/2018   Procedure: Coronary/Graft Acute MI Revascularization;  Surgeon: Isaias Cowman, MD;  Location: [place] [facility] LAB;  Service: Cardiovascular;  Laterality: N/A;  . EYE SURGERY     bilateral cataract 2017 Dr. Megan Mans  . foot and ankle repair Right 2005  . FRACTURE SURGERY     ANKLE PLATE AND SCREWS  . GALLBLADER    .  JOINT REPLACEMENT     left total hip 11/11/15  . JOINT REPLACEMENT     right total hip 12/2017 Dr. Rudene Christians   . KNEE ARTHROSCOPY Right   . LEFT HEART CATH AND CORONARY ANGIOGRAPHY N/A 09/16/2018   Procedure: LEFT HEART CATH AND CORONARY ANGIOGRAPHY;  Surgeon: Isaias Cowman, MD;  Location: [place] [facility];  Service: Cardiovascular;  Laterality: N/A;  . LUMBAR SPINE FUSION ONE LEVEL  05/03/2013  . RENAL ARTERY STENT Right   . ROTATOR CUFF REPAIR Right 2006  . TONSILLECTOMY    . TOTAL HIP ARTHROPLASTY Left 11/11/2015   Procedure: TOTAL HIP ARTHROPLASTY ANTERIOR APPROACH;  Surgeon: Hessie Knows, MD;  Location: [facility] ORS;  Service: Orthopedics;  Laterality: Left;  . TOTAL HIP ARTHROPLASTY Right 12/13/2017   Procedure: TOTAL HIP ARTHROPLASTY ANTERIOR  APPROACH;  Surgeon: Hessie Knows, MD;  Location: [facility] ORS;  Service: Orthopedics;  Laterality: Right;  . TRANSCATH PLACEMENT INTRAVASCULAR STENT LEG  03/2015   Family History  Problem Relation Age of Onset  . CVA Father   . Stroke Father   . Lung cancer Mother   . Arthritis Mother   . Stomach cancer Sister   . Colon cancer Sister   . Diabetes Brother    Social History   Socioeconomic History  . Marital status: Married    Spouse name: Not on file  . Number of children: Not on file  . Years of education: Not on file  . Highest education level: Not on file  Occupational History  . Not on file  Social Needs  . Financial resource strain: Not very hard  . Food insecurity:    Worry: Never true    Inability: Never true  . Transportation needs:    Medical: No    Non-medical: No  Tobacco Use  . Smoking status: Former Smoker    Packs/day: 1.00    Years: 3.00    Pack years: 3.00    Types: Cigarettes    Last attempt to quit: 06/30/1962    Years since quitting: 56.3  . Smokeless tobacco: Former Systems developer    Types: [place] date: 12/05/1968  Substance and Sexual Activity  . Alcohol use: No  . Drug use: No  . Sexual activity: Not Currently  Lifestyle  . Physical activity:    Days per week: 2 days    Minutes per session: 30 min  . Stress: Not at all  Relationships  . Social connections:    Talks on phone: Patient refused    Gets together: Patient refused    Attends religious service: Patient refused    Active member of club or organization: Patient refused    Attends meetings of clubs or organizations: Patient refused    Relationship status: Patient refused  . Intimate partner violence:    Fear of current or ex partner: No    Emotionally abused: No    Physically abused: No    Forced sexual activity: No  Other Topics Concern  . Not on file  Social History Narrative   Married    Current Meds  Medication Sig  . amitriptyline (ELAVIL) 50 MG tablet Take 50 mg by mouth  at bedtime as needed.   Marland Kitchen amLODipine (NORVASC) 5 MG tablet Take 1 tablet (5 mg total) by mouth 2 (two) times daily. If blood pressure >140/>90 make take another 1 pill of 5 mg  . aspirin EC 81 MG EC tablet Take 1 tablet (81 mg total) by mouth daily.  Marland Kitchen  carvedilol (COREG) 25 MG tablet Take 1 tablet (25 mg total) by mouth 2 (two) times daily with a meal.  . Cholecalciferol 50000 units TABS Take 1 tablet by mouth once a week.  . cloNIDine (CATAPRES) 0.2 MG tablet Take by mouth 2 (two) times daily.   . colchicine 0.6 MG tablet TAKE 1 TABLET BY MOUTH ONCE DAILY  . diclofenac sodium (VOLTAREN) 1 % GEL Apply 2 g topically 4 (four) times daily as needed (for pain).   Marland Kitchen dicyclomine (BENTYL) 10 MG capsule Take 10 mg by mouth 3 (three) times daily before meals.   . fluticasone (FLONASE) 50 MCG/ACT nasal spray Place 2 sprays into both nostrils daily.  . furosemide (LASIX) 40 MG tablet Take by mouth.  . gabapentin (NEURONTIN) 300 MG capsule take 2 capsules (659m) by mouth three times a day  . hydrALAZINE (APRESOLINE) 100 MG tablet TAKE 100 MG BY MOUTH THREE TIMES A DAY  . insulin lispro (HUMALOG) 100 UNIT/ML KiwkPen Inject 14-32 Units into the skin 3 (three) times daily. (according to sliding scale - max 96u over 24 hours)  . ipratropium (ATROVENT) 0.06 % nasal spray Place 2 sprays into both nostrils 4 (four) times daily.  . isosorbide mononitrate (IMDUR) 120 MG 24 hr tablet TAKE 1 TABLET BY MOUTH EVERY DAY  . levocetirizine (XYZAL) 5 MG tablet Take 1 tablet (5 mg total) by mouth every evening.  . montelukast (SINGULAIR) 10 MG tablet Take 1 tablet (10 mg total) by mouth at bedtime.  . Multiple Vitamin (MULTI-VITAMINS) TABS Take 1 tablet by mouth daily.  . nitroGLYCERIN (NITROSTAT) 0.4 MG SL tablet Place 1 tablet (0.4 mg total) under the tongue every 5 (five) minutes x 3 doses as needed. For chest pain.  Call 911 if no relief after 3 tablets  . omega-3 acid ethyl esters (LOVAZA) 1 g capsule Take 2 capsules (2  g total) by mouth 2 (two) times daily.  .Marland Kitchenomeprazole (PRILOSEC) 40 MG capsule Take 40 mg by mouth 2 (two) times daily.   . pravastatin (PRAVACHOL) 40 MG tablet Take 1 tablet (40 mg total) by mouth daily.  . probenecid (BENEMID) 500 MG tablet Take 500 mg by mouth 2 (two) times daily.   . sodium chloride HYPERTONIC 3 % nebulizer solution Take by nebulization 2 (two) times daily.  .Marland Kitchenspironolactone (ALDACTONE) 25 MG tablet Take by mouth.  . TRESIBA FLEXTOUCH 200 UNIT/ML SOPN Inject 90 Units into the skin at bedtime.  . vitamin C (ASCORBIC ACID) 500 MG tablet Take 500 mg by mouth daily.   . Vitamin E 400 units TABS Take 400 Units by mouth 2 (two) times daily.  . [DISCONTINUED] dextromethorphan-guaiFENesin (MUCINEX DM) 30-600 MG 12hr tablet Take by mouth.  . [DISCONTINUED] ticagrelor (BRILINTA) 90 MG TABS tablet Take 1 tablet (90 mg total) by mouth 2 (two) times daily.   Allergies  Allergen Reactions  . Allopurinol Diarrhea, Other (See Comments) and Nausea And Vomiting    Other Reaction: GI Upset  . Atenolol Other (See Comments)    Other Reaction: bradycardia  . Atorvastatin Other (See Comments)    Other Reaction: muscle aches  . Ramipril Rash  . Rosuvastatin Other (See Comments)    Muscle aches  . Simvastatin Other (See Comments)    Other Reaction: MUSCLE ACHES (Zocor)  . Valsartan Other (See Comments)    Other Reaction: facial swelling  . Cardizem [Diltiazem] Rash   Recent Results (from the past 2160 hour(s))  Blood gas, arterial  Status: Abnormal   Collection Time: 07/18/18  4:17 PM  Result Value Ref Range   FIO2 0.21    pH, Arterial 7.42 7.350 - 7.450   pCO2 arterial 36 32.0 - 48.0 mmHg   pO2, Arterial 63 (L) 83.0 - 108.0 mmHg   Bicarbonate 23.4 20.0 - 28.0 mmol/L   Acid-base deficit 0.7 0.0 - 2.0 mmol/L   O2 Saturation 92.2 %   Patient temperature 37.0    Collection site LEFT RADIAL    Sample type ARTERIAL DRAW    Allens test (pass/fail) PASS PASS    Comment: Performed  at [facility], [address] [place], [place] [address]  I-STAT creatinine     Status: None   Collection Time: 08/22/18  8:39 AM  Result Value Ref Range   Creatinine, Ser 1.10 0.61 - 1.24 mg/dL  CBC with Differential/Platelet     Status: Abnormal   Collection Time: 09/16/18  1:24 AM  Result Value Ref Range   WBC 12.2 (H) 4.0 - 10.5 K/uL   RBC 4.26 4.22 - 5.81 MIL/uL   Hemoglobin 13.8 13.0 - 17.0 g/dL   HCT 41.3 39.0 - 52.0 %   MCV 96.9 80.0 - 100.0 fL   MCH 32.4 26.0 - 34.0 pg   MCHC 33.4 30.0 - 36.0 g/dL   RDW 13.5 11.5 - 15.5 %   Platelets 266 150 - 400 K/uL   nRBC 0.0 0.0 - 0.2 %   Neutrophils Relative % 85 %   Neutro Abs 10.4 (H) 1.7 - 7.7 K/uL   Lymphocytes Relative 9 %   Lymphs Abs 1.1 0.7 - 4.0 K/uL   Monocytes Relative 5 %   Monocytes Absolute 0.6 0.1 - 1.0 K/uL   Eosinophils Relative 0 %   Eosinophils Absolute 0.0 0.0 - 0.5 K/uL   Basophils Relative 0 %   Basophils Absolute 0.0 0.0 - 0.1 K/uL   Immature Granulocytes 1 %   Abs Immature Granulocytes 0.09 (H) 0.00 - 0.07 K/uL    Comment: Performed at [facility], [address] [place], [place] [address]  Protime-INR     Status: None   Collection Time: 09/16/18  1:24 AM  Result Value Ref Range   Prothrombin Time 14.0 11.4 - 15.2 seconds   INR 1.09     Comment: Performed at [facility], [address] [place], [place] [address]  APTT     Status: None   Collection Time: 09/16/18  1:24 AM  Result Value Ref Range   aPTT 30 24 - 36 seconds    Comment: Performed at [facility], [address] [place], [place] [address]  Comprehensive metabolic panel     Status: Abnormal   Collection Time: 09/16/18  1:24 AM  Result Value Ref Range   Sodium 139 135 - 145 mmol/L   Potassium 3.4 (L) 3.5 - 5.1 mmol/L   Chloride 109 98 - 111 mmol/L   CO2 24 22 - 32 mmol/L   Glucose, Bld 108 (H) 70 - 99 mg/dL   BUN 14 8 - 23 mg/dL   Creatinine, Ser 0.44 (L) 0.61 - 1.24 mg/dL   Calcium 9.3  8.9 - 10.3 mg/dL   Total Protein 7.5 6.5 - 8.1 g/dL   Albumin 4.5 3.5 - 5.0 g/dL   AST 19 15 - 41 U/L   ALT 18 0 - 44 U/L   Alkaline Phosphatase 73 38 - 126 U/L   Total Bilirubin 0.2 (L) 0.3 - 1.2 mg/dL  GFR calc non Af Amer >60 >60 mL/min   GFR calc Af Amer >60 >60 mL/min   Anion gap 6 5 - 15    Comment: Performed at [facility], [address] [place], [place] [address]  Troponin I - ONCE - STAT     Status: None   Collection Time: 09/16/18  1:24 AM  Result Value Ref Range   Troponin I <0.03 <0.03 ng/mL    Comment: Performed at [facility], [address] [place], [place] [address]  Lipid panel     Status: None   Collection Time: 09/16/18  1:24 AM  Result Value Ref Range   Cholesterol 131 0 - 200 mg/dL   Triglycerides 45 <150 mg/dL   HDL 57 >40 mg/dL   Total CHOL/HDL Ratio 2.3 RATIO   VLDL 9 0 - 40 mg/dL   LDL Cholesterol 65 0 - 99 mg/dL    Comment:        Total Cholesterol/HDL:CHD Risk Coronary Heart Disease Risk Table                     Men   Women  1/2 Average Risk   3.4   3.3  Average Risk       5.0   4.4  2 X Average Risk   9.6   7.1  3 X Average Risk  23.4   11.0        Use the calculated Patient Ratio above and the CHD Risk Table to determine the patient's CHD Risk.        ATP III CLASSIFICATION (LDL):  <100     mg/dL   Optimal  100-129  mg/dL   Near or Above                    Optimal  130-159  mg/dL   Borderline  160-189  mg/dL   High  >190     mg/dL   Very High Performed at [facility], [address] [place], [place] [address]   [place]on [place], [place] [address]     Status: Abnormal   Collection Time: 09/16/18  1:55 AM  Result Value Ref Range   Sodium 136 135 - 145 mmol/L   Potassium 4.9 3.5 - 5.1 mmol/L   Chloride 99 98 - 111 mmol/L   BUN 22 8 - 23 mg/dL   Creatinine, Ser 1.10 0.61 - 1.24 mg/dL   Glucose, Bld 298 (H) 70 - 99 mg/dL   Calcium, Ion 1.19 1.15 - 1.40 mmol/L   TCO2 23 22 - 32 mmol/L   Hemoglobin 13.9 13.0 - 17.0 g/dL    HCT 41.0 39.0 - 52.0 %  POCT Activated clotting time     Status: None   Collection Time: 09/16/18  2:21 AM  Result Value Ref Range   Activated Clotting Time 329 seconds  MRSA PCR Screening     Status: None   Collection Time: 09/16/18  3:46 AM  Result Value Ref Range   MRSA by PCR NEGATIVE NEGATIVE    Comment:        The GeneXpert MRSA Assay (FDA approved for NASAL specimens only), is one component of a comprehensive MRSA colonization surveillance program. It is not intended to diagnose MRSA infection nor to guide or monitor treatment for MRSA infections. Performed at [facility], [address] [place], [place] [address]   Glucose, capillary     Status: Abnormal   Collection Time: 09/16/18  3:49 AM  Result Value Ref Range   Glucose-Capillary 289 (H) 70 - 99 mg/dL  Troponin I - Once     Status: Abnormal   Collection Time: 09/16/18  5:20 AM  Result Value Ref Range   Troponin I 0.38 (HH) <0.03 ng/mL    Comment: CRITICAL RESULT CALLED TO, READ BACK BY AND VERIFIED WITH MISTY CAUSEY ON 09/16/2018 AT 0618 QSD Performed at [facility], [address] [place], [place] [address]   Glucose, capillary     Status: Abnormal   Collection Time: 09/16/18  7:49 AM  Result Value Ref Range   Glucose-Capillary 215 (H) 70 - 99 mg/dL  Troponin I - Once     Status: Abnormal   Collection Time: 09/16/18  8:50 AM  Result Value Ref Range   Troponin I 0.70 (HH) <0.03 ng/mL    Comment: CRITICAL VALUE NOTED. VALUE IS CONSISTENT WITH PREVIOUSLY REPORTED/CALLED VALUE.PMF Performed at [facility], [address] [place], [place] [address]   ECHOCARDIOGRAM COMPLETE     Status: None   Collection Time: 09/16/18 10:10 AM  Result Value Ref Range   Weight 3,301.61 oz   Height 68 in   BP 124/64 mmHg  Glucose, capillary     Status: Abnormal   Collection Time: 09/16/18 11:21 AM  Result Value Ref Range   Glucose-Capillary 241 (H) 70 - 99 mg/dL  Glucose, capillary      Status: Abnormal   Collection Time: 09/16/18  4:01 PM  Result Value Ref Range   Glucose-Capillary 216 (H) 70 - 99 mg/dL  Glucose, capillary     Status: Abnormal   Collection Time: 09/16/18  9:16 PM  Result Value Ref Range   Glucose-Capillary 285 (H) 70 - 99 mg/dL  Basic metabolic panel     Status: Abnormal   Collection Time: 09/17/18  5:00 AM  Result Value Ref Range   Sodium 132 (L) 135 - 145 mmol/L   Potassium 3.7 3.5 - 5.1 mmol/L   Chloride 98 98 - 111 mmol/L   CO2 24 22 - 32 mmol/L   Glucose, Bld 206 (H) 70 - 99 mg/dL   BUN 21 8 - 23 mg/dL   Creatinine, Ser 0.90 0.61 - 1.24 mg/dL   Calcium 8.6 (L) 8.9 - 10.3 mg/dL   GFR calc non Af Amer >60 >60 mL/min   GFR calc Af Amer >60 >60 mL/min   Anion gap 10 5 - 15    Comment: Performed at [facility], [address] [place], [place] [address]  Glucose, capillary     Status: Abnormal   Collection Time: 09/17/18  8:03 AM  Result Value Ref Range   Glucose-Capillary 167 (H) 70 - 99 mg/dL  Glucose, capillary     Status: Abnormal   Collection Time: 09/17/18 11:22 AM  Result Value Ref Range   Glucose-Capillary 255 (H) 70 - 99 mg/dL   Objective  Body mass index is 29.77 kg/m. Wt Readings from Last 3 Encounters:  10/05/18 195 lb 12.8 oz (88.8 kg)  09/17/18 202 lb 6.4 oz (91.8 kg)  08/31/18 199 lb 9.6 oz (90.5 kg)   Temp Readings from Last 3 Encounters:  10/05/18 97.7 F (36.5 C) (Oral)  09/17/18 98.1 F (36.7 C) (Oral)  08/31/18 97.7 F (36.5 C) (Temporal)   BP Readings from Last 3 Encounters:  10/05/18 120/60  09/17/18 (!) 148/60  08/31/18 (!) 157/66   Pulse Readings from Last 3 Encounters:  10/05/18 (!) 56  09/17/18 64  08/31/18 65    Physical Exam Vitals signs and nursing note reviewed.  Constitutional:      Appearance: Normal appearance. He is well-developed and well-groomed.  HENT:     Head: Normocephalic and atraumatic.     Mouth/Throat:     Mouth: Mucous membranes are moist.     Pharynx:  Oropharynx is clear.  Eyes:     Conjunctiva/sclera: Conjunctivae normal.     Pupils: Pupils are equal, round, and reactive to light.  Cardiovascular:     Rate and Rhythm: Normal rate and regular rhythm.     Heart sounds: Normal heart sounds.  Pulmonary:     Effort: Pulmonary effort is normal.     Breath sounds: Normal breath sounds.  Skin:    General: Skin is warm and dry.  Neurological:     General: No focal deficit present.     Mental Status: He is alert and oriented to person, place, and time.     Comments: Walking with walker today    Psychiatric:        Attention and Perception: Attention and perception normal.        Mood and Affect: Mood and affect normal.        Speech: Speech normal.        Behavior: Behavior normal. Behavior is cooperative.        Thought Content: Thought content normal.        Cognition and Memory: Cognition and memory normal.        Judgment: Judgment normal.     Assessment   1. Stemi/cad  Cath 09/16/2018   Ost Cx to Prox Cx lesion is 70% stenosed.  Ost 1st Mrg lesion is 100% stenosed.  Ost 2nd Mrg lesion is 100% stenosed.  Ost RCA to Prox RCA lesion is 100% stenosed.  Origin lesion is 95% stenosed.  RPDA lesion is 60% stenosed.  Dist LAD lesion is 100% stenosed.  Mid LAD to Dist LAD lesion is 60% stenosed.  Origin lesion is 100% stenosed.  A stent was successfully placed.  Post intervention, there is a 0% residual stenosis.  Ost LAD to Prox LAD lesion is 100% stenosed.   1.  Severe three-vessel coronary artery disease with occluded proximal LAD, occluded proximal RCA, 70% stenosis proximal left circumflex, occluded OM1, and occluded OM 2 2.  Patent LIMA to LAD, patent SVG to PDA, occluded SVG to D1 3.  Patent SVG to OM 3 with high-grade 95% stenosis in the ostium of the graft 4.  Mildly reduced left ventricular function with LVEF 40% with apical akinesis 5.  Access for primary PCI with DES ostium of SVG to OM 3   2. htn  controlled likely 2/2 renovascular htn with stent unilateral  3. HM Plan  1. Will disc with [facility] cardiology if plan is anything beyond med management  Based on cath  Cont meds  2. Cont meds  3.  Flu shot utd  pna 23 had 02/2014 UTD prevnar Disc Tdap and shingrix in future   PSAhad nl 10/03/17 0.5 consider DRE in future Colonoscopy had 04/[facility] GIh/o crohns on mesalamine 2.4 mg qd-negative colonoscopy with multiple bxs Consider repeat DEXA reviewed DEXA +osteoporosis noted 06/07/04 CT chest5/13/19 + lung nodules With h/o pulm nodules on CXR and CT chest10/11/16and former smoker [place] dermatologist [id number] c/w left temple and right hand Aks. -H/o skin cancer  Provider: Dr. Olivia Mackie McLean-Scocuzza-Internal Medicine

## 2018-10-05 NOTE — Patient Instructions (Signed)
Heart Attack Your heart is a muscle and needs oxygen to survive. A heart attack (myocardial infarction, MI) is a condition that occurs when your heart does not get enough oxygen, and the heart muscle begins to die (ischemia). This can cause permanent damage if not treated right away. A heart attack is a medical emergency. Heart attack is also known as acute coronary syndrome (ACS). ACS is a term used to describe a group of conditions that affect blood flow to the heart. What are the causes? This condition can be caused by:  Atherosclerosis. This is when a fatty substance (plaque) gradually builds up in the blood vessels that flow to the heart (coronaryarteries). This buildup can block or reduce blood supply to one or more of the coronary arteries.  A blood clot. A blood clot can develop suddenly when plaque breaks up (ruptures) within a coronary artery, or it can travel to the heart from another area of the body. The blood clot blocks the artery and prevents blood flow to the heart.  Low blood pressure (hypotension).  An abnormal heart beat (arrhythmia).  Medical conditions that cause a decrease of oxygen to the heart, such as anemiaorrespiratory failure (oxygen mismatch).  Severe tightening (spasm) of a blood vessel that cuts off blood flow to the heart.  Tearing of a coronary artery (spontaneous coronary artery dissection).  Certain heart procedures. What increases the risk? The following factors may make you more likely to develop this condition:  Age. The older a person is, the higher the risk.  Personal or family history of chest pain, heart attack, peripheral artery disease, or stroke.  Being male.  Smoking.  Lack of exercise.  Overweight or obesity.  High blood pressure (hypertension).  High cholesterol.  Diabetes.  Drinking too much alcohol.  Using drugs, such as cocaine or methamphetamine. What are the signs or symptoms? Symptoms of this condition may vary,  depending on factors like gender and age. Symptoms may include:  Chest pain. It may feel like: ? Crushing or squeezing. ? Tightness, pressure, fullness, or heaviness.  Pain in the arm, neck, jaw, back, or upper body.  Shortness of breath.  Heartburn or indigestion.  Nausea.  Sudden cold sweats.  Feeling tired.  Sudden lightheadedness. How is this diagnosed? This condition may be diagnosed through tests, such as:  Electrocardiogram (ECG) to measure the electrical activity of your heart.  Blood tests to check for chemicals released by damaged heart muscle (cardiac markers).  Coronary angiogram to evaluate blood flow and heart function.  CT scan to see the heart more clearly.  Echocardiogram to evaluate heart motion and blood flow. How is this treated? A heart attack must be treated as soon as possible. Treatment may include:  Medicines to: ? Break up or dissolve blood clots (fibrinolytic therapy). ? Thin blood and to help prevent blood clots. ? Treat blood pressure. ? Improve blood flow to the heart. ? Reduce pain. ? Reduce cholesterol.  Procedures to widen a blocked artery and keep it open (angioplasty and stent placement).  Open heart surgery (coronary artery bypass graft, CABG). This enables blood to flow to the heart by going around (bypassing) the damaged part of the artery.  Oxygen therapy if needed.  Cardiac rehabilitation. This improves your health and well-being through exercise training, education, and counseling. Follow these instructions at home: Medicines  Take over-the-counter and prescription medicines only as told by your health care provider.  Do not take the following medicines unless your health care provider  says it is okay to take them: ? NSAIDs. ? Supplements that contain vitamin A, vitamin E, or both. ? Hormone replacement therapy that contains estrogen with or without progestin. Lifestyle  Do not use any products that contain nicotine  or tobacco, such as cigarettes and e-cigarettes. If you need help quitting, ask your health care provider.  Avoid secondhand smoke.  Exercise regularly. Ask your health care provider about participating in a cardiac rehabilitation program that helps you start exercising safely after a heart attack.  Eat a heart-healthy diet. Your health care provider will tell you what foods to eat.  Maintain a healthy weight.  Learn ways to manage stress.  Do not use illegal drugs. Alcohol use  Do not drink alcohol if: ? Your health care provider tells you not to drink. ? You are pregnant, may be pregnant, or are planning to become pregnant.  If you drink alcohol, limit how much you have: ? 0-1 drink a day for women. ? 0-2 drinks a day for men.  Be aware of how much alcohol is in your drink. In the [place] one drink equals one typical bottle of beer (12 oz), one-half glass of wine (5 oz), or one shot of hard liquor (1 oz). General instructions  Work with your health care provider to manage any other conditions you have, such as hypertension or diabetes. These conditions affect your heart.  Get screened for depression, and seek treatment if needed.  Keep your vaccinations up to date. Get the flu (influenza) vaccine every year.  Keep all follow-up visits as told by your health care provider. This is important. Contact a health care provider if:  You feel overwhelmed or sad.  You have trouble doing your daily activities. Get help right away if you:  Have sudden, unexplained discomfort in your chest, arms, back, neck, jaw, or upper body.  Have shortness of breath.  Suddenly start to sweat or your skin gets clammy.  Feel nauseous or vomit.  Have unexplained fatigue or weakness.  Suddenly feel lightheaded or dizzy.  Notice your heart starts to beat fast or feels like it is skipping beats.  Have blood pressure that is higher than 180/120. These symptoms may represent a serious problem  that is an emergency. Do not wait to see if the symptoms will go away. Get medical help right away. Call your local emergency services (911 in the [place] Do not drive yourself to the hospital. Summary  A heart attack (myocardial infarction, MI) is a condition that occurs when an artery in the heart (coronary artery) becomes narrowed or blocked. The narrowing or blockage cuts off the blood and oxygen supply to the heart, which can permanently damage the heart.  A heart attack is an emergency. Get help right away if you have sudden pain in your chest, arms, back, jaw, or upper body. Seek help if you have nausea or you vomit, or you become lightheaded or dizzy.  Treatment is a combination of medicines and procedures, if needed, to open the blocked artery and restore blood flow to the heart. This information is not intended to replace advice given to you by your health care provider. Make sure you discuss any questions you have with your health care provider. Document Released: 08/30/2005 Document Revised: 10/14/2017 Document Reviewed: 10/14/2017 Elsevier Interactive Patient Education  2019 [employer].

## 2018-10-06 ENCOUNTER — Encounter: Payer: Self-pay | Admitting: Internal Medicine

## 2018-10-09 DIAGNOSIS — M1A00X0 Idiopathic chronic gout, unspecified site, without tophus (tophi): Secondary | ICD-10-CM | POA: Diagnosis not present

## 2018-10-09 DIAGNOSIS — M159 Polyosteoarthritis, unspecified: Secondary | ICD-10-CM | POA: Diagnosis not present

## 2018-10-09 NOTE — Progress Notes (Signed)
Note has been faxed.

## 2018-10-10 DIAGNOSIS — I2510 Atherosclerotic heart disease of native coronary artery without angina pectoris: Secondary | ICD-10-CM | POA: Diagnosis not present

## 2018-10-10 DIAGNOSIS — I213 ST elevation (STEMI) myocardial infarction of unspecified site: Secondary | ICD-10-CM | POA: Diagnosis not present

## 2018-10-10 DIAGNOSIS — I5023 Acute on chronic systolic (congestive) heart failure: Secondary | ICD-10-CM | POA: Diagnosis not present

## 2018-10-10 DIAGNOSIS — N182 Chronic kidney disease, stage 2 (mild): Secondary | ICD-10-CM | POA: Diagnosis not present

## 2018-10-10 DIAGNOSIS — E1122 Type 2 diabetes mellitus with diabetic chronic kidney disease: Secondary | ICD-10-CM | POA: Diagnosis not present

## 2018-10-10 DIAGNOSIS — I110 Hypertensive heart disease with heart failure: Secondary | ICD-10-CM | POA: Diagnosis not present

## 2018-10-12 DIAGNOSIS — I213 ST elevation (STEMI) myocardial infarction of unspecified site: Secondary | ICD-10-CM | POA: Diagnosis not present

## 2018-10-12 DIAGNOSIS — I5023 Acute on chronic systolic (congestive) heart failure: Secondary | ICD-10-CM | POA: Diagnosis not present

## 2018-10-12 DIAGNOSIS — I2510 Atherosclerotic heart disease of native coronary artery without angina pectoris: Secondary | ICD-10-CM | POA: Diagnosis not present

## 2018-10-12 DIAGNOSIS — N182 Chronic kidney disease, stage 2 (mild): Secondary | ICD-10-CM | POA: Diagnosis not present

## 2018-10-12 DIAGNOSIS — I110 Hypertensive heart disease with heart failure: Secondary | ICD-10-CM | POA: Diagnosis not present

## 2018-10-12 DIAGNOSIS — E1122 Type 2 diabetes mellitus with diabetic chronic kidney disease: Secondary | ICD-10-CM | POA: Diagnosis not present

## 2018-10-17 DIAGNOSIS — I213 ST elevation (STEMI) myocardial infarction of unspecified site: Secondary | ICD-10-CM | POA: Diagnosis not present

## 2018-10-17 DIAGNOSIS — I2510 Atherosclerotic heart disease of native coronary artery without angina pectoris: Secondary | ICD-10-CM | POA: Diagnosis not present

## 2018-10-17 DIAGNOSIS — I110 Hypertensive heart disease with heart failure: Secondary | ICD-10-CM | POA: Diagnosis not present

## 2018-10-17 DIAGNOSIS — E1122 Type 2 diabetes mellitus with diabetic chronic kidney disease: Secondary | ICD-10-CM | POA: Diagnosis not present

## 2018-10-17 DIAGNOSIS — N182 Chronic kidney disease, stage 2 (mild): Secondary | ICD-10-CM | POA: Diagnosis not present

## 2018-10-17 DIAGNOSIS — I5023 Acute on chronic systolic (congestive) heart failure: Secondary | ICD-10-CM | POA: Diagnosis not present

## 2018-10-19 DIAGNOSIS — I213 ST elevation (STEMI) myocardial infarction of unspecified site: Secondary | ICD-10-CM | POA: Diagnosis not present

## 2018-10-19 DIAGNOSIS — E1122 Type 2 diabetes mellitus with diabetic chronic kidney disease: Secondary | ICD-10-CM | POA: Diagnosis not present

## 2018-10-19 DIAGNOSIS — I5023 Acute on chronic systolic (congestive) heart failure: Secondary | ICD-10-CM | POA: Diagnosis not present

## 2018-10-19 DIAGNOSIS — N182 Chronic kidney disease, stage 2 (mild): Secondary | ICD-10-CM | POA: Diagnosis not present

## 2018-10-19 DIAGNOSIS — I2510 Atherosclerotic heart disease of native coronary artery without angina pectoris: Secondary | ICD-10-CM | POA: Diagnosis not present

## 2018-10-19 DIAGNOSIS — I110 Hypertensive heart disease with heart failure: Secondary | ICD-10-CM | POA: Diagnosis not present

## 2018-10-23 DIAGNOSIS — I272 Pulmonary hypertension, unspecified: Secondary | ICD-10-CM | POA: Diagnosis not present

## 2018-10-23 DIAGNOSIS — Z8673 Personal history of transient ischemic attack (TIA), and cerebral infarction without residual deficits: Secondary | ICD-10-CM | POA: Diagnosis not present

## 2018-10-23 DIAGNOSIS — I2510 Atherosclerotic heart disease of native coronary artery without angina pectoris: Secondary | ICD-10-CM | POA: Diagnosis not present

## 2018-10-23 DIAGNOSIS — E1122 Type 2 diabetes mellitus with diabetic chronic kidney disease: Secondary | ICD-10-CM | POA: Diagnosis not present

## 2018-10-23 DIAGNOSIS — I480 Paroxysmal atrial fibrillation: Secondary | ICD-10-CM | POA: Diagnosis not present

## 2018-10-23 DIAGNOSIS — G510 Bell's palsy: Secondary | ICD-10-CM | POA: Diagnosis not present

## 2018-10-23 DIAGNOSIS — M48061 Spinal stenosis, lumbar region without neurogenic claudication: Secondary | ICD-10-CM | POA: Diagnosis not present

## 2018-10-23 DIAGNOSIS — I110 Hypertensive heart disease with heart failure: Secondary | ICD-10-CM | POA: Diagnosis not present

## 2018-10-23 DIAGNOSIS — F0280 Dementia in other diseases classified elsewhere without behavioral disturbance: Secondary | ICD-10-CM | POA: Diagnosis not present

## 2018-10-23 DIAGNOSIS — Z86711 Personal history of pulmonary embolism: Secondary | ICD-10-CM | POA: Diagnosis not present

## 2018-10-23 DIAGNOSIS — G4733 Obstructive sleep apnea (adult) (pediatric): Secondary | ICD-10-CM | POA: Diagnosis not present

## 2018-10-23 DIAGNOSIS — Z96643 Presence of artificial hip joint, bilateral: Secondary | ICD-10-CM | POA: Diagnosis not present

## 2018-10-23 DIAGNOSIS — H2513 Age-related nuclear cataract, bilateral: Secondary | ICD-10-CM | POA: Diagnosis not present

## 2018-10-23 DIAGNOSIS — Z794 Long term (current) use of insulin: Secondary | ICD-10-CM | POA: Diagnosis not present

## 2018-10-23 DIAGNOSIS — I5023 Acute on chronic systolic (congestive) heart failure: Secondary | ICD-10-CM | POA: Diagnosis not present

## 2018-10-23 DIAGNOSIS — N182 Chronic kidney disease, stage 2 (mild): Secondary | ICD-10-CM | POA: Diagnosis not present

## 2018-10-23 DIAGNOSIS — E785 Hyperlipidemia, unspecified: Secondary | ICD-10-CM | POA: Diagnosis not present

## 2018-10-23 DIAGNOSIS — Z87891 Personal history of nicotine dependence: Secondary | ICD-10-CM | POA: Diagnosis not present

## 2018-10-23 DIAGNOSIS — M150 Primary generalized (osteo)arthritis: Secondary | ICD-10-CM | POA: Diagnosis not present

## 2018-10-23 DIAGNOSIS — M109 Gout, unspecified: Secondary | ICD-10-CM | POA: Diagnosis not present

## 2018-10-23 DIAGNOSIS — Z9981 Dependence on supplemental oxygen: Secondary | ICD-10-CM | POA: Diagnosis not present

## 2018-10-23 DIAGNOSIS — G309 Alzheimer's disease, unspecified: Secondary | ICD-10-CM | POA: Diagnosis not present

## 2018-10-23 DIAGNOSIS — E1142 Type 2 diabetes mellitus with diabetic polyneuropathy: Secondary | ICD-10-CM | POA: Diagnosis not present

## 2018-10-23 DIAGNOSIS — I213 ST elevation (STEMI) myocardial infarction of unspecified site: Secondary | ICD-10-CM | POA: Diagnosis not present

## 2018-10-23 DIAGNOSIS — F0150 Vascular dementia without behavioral disturbance: Secondary | ICD-10-CM | POA: Diagnosis not present

## 2018-10-24 DIAGNOSIS — G309 Alzheimer's disease, unspecified: Secondary | ICD-10-CM | POA: Diagnosis not present

## 2018-10-24 DIAGNOSIS — R410 Disorientation, unspecified: Secondary | ICD-10-CM | POA: Diagnosis not present

## 2018-10-24 DIAGNOSIS — G4733 Obstructive sleep apnea (adult) (pediatric): Secondary | ICD-10-CM | POA: Diagnosis not present

## 2018-10-24 DIAGNOSIS — F0150 Vascular dementia without behavioral disturbance: Secondary | ICD-10-CM | POA: Diagnosis not present

## 2018-10-24 DIAGNOSIS — I480 Paroxysmal atrial fibrillation: Secondary | ICD-10-CM | POA: Diagnosis not present

## 2018-10-24 DIAGNOSIS — F0280 Dementia in other diseases classified elsewhere without behavioral disturbance: Secondary | ICD-10-CM | POA: Diagnosis not present

## 2018-10-24 DIAGNOSIS — Z8669 Personal history of other diseases of the nervous system and sense organs: Secondary | ICD-10-CM | POA: Diagnosis not present

## 2018-10-24 DIAGNOSIS — E1142 Type 2 diabetes mellitus with diabetic polyneuropathy: Secondary | ICD-10-CM | POA: Diagnosis not present

## 2018-10-26 DIAGNOSIS — E1122 Type 2 diabetes mellitus with diabetic chronic kidney disease: Secondary | ICD-10-CM | POA: Diagnosis not present

## 2018-10-26 DIAGNOSIS — I110 Hypertensive heart disease with heart failure: Secondary | ICD-10-CM | POA: Diagnosis not present

## 2018-10-26 DIAGNOSIS — I2510 Atherosclerotic heart disease of native coronary artery without angina pectoris: Secondary | ICD-10-CM | POA: Diagnosis not present

## 2018-10-26 DIAGNOSIS — I213 ST elevation (STEMI) myocardial infarction of unspecified site: Secondary | ICD-10-CM | POA: Diagnosis not present

## 2018-10-26 DIAGNOSIS — N182 Chronic kidney disease, stage 2 (mild): Secondary | ICD-10-CM | POA: Diagnosis not present

## 2018-10-26 DIAGNOSIS — I5023 Acute on chronic systolic (congestive) heart failure: Secondary | ICD-10-CM | POA: Diagnosis not present

## 2018-10-30 DIAGNOSIS — I110 Hypertensive heart disease with heart failure: Secondary | ICD-10-CM | POA: Diagnosis not present

## 2018-10-30 DIAGNOSIS — E1122 Type 2 diabetes mellitus with diabetic chronic kidney disease: Secondary | ICD-10-CM | POA: Diagnosis not present

## 2018-10-30 DIAGNOSIS — I2510 Atherosclerotic heart disease of native coronary artery without angina pectoris: Secondary | ICD-10-CM | POA: Diagnosis not present

## 2018-10-30 DIAGNOSIS — N182 Chronic kidney disease, stage 2 (mild): Secondary | ICD-10-CM | POA: Diagnosis not present

## 2018-10-30 DIAGNOSIS — I213 ST elevation (STEMI) myocardial infarction of unspecified site: Secondary | ICD-10-CM | POA: Diagnosis not present

## 2018-10-30 DIAGNOSIS — I5023 Acute on chronic systolic (congestive) heart failure: Secondary | ICD-10-CM | POA: Diagnosis not present

## 2018-11-01 ENCOUNTER — Telehealth: Payer: Self-pay | Admitting: Internal Medicine

## 2018-11-01 DIAGNOSIS — I213 ST elevation (STEMI) myocardial infarction of unspecified site: Secondary | ICD-10-CM | POA: Diagnosis not present

## 2018-11-01 DIAGNOSIS — I2510 Atherosclerotic heart disease of native coronary artery without angina pectoris: Secondary | ICD-10-CM | POA: Diagnosis not present

## 2018-11-01 DIAGNOSIS — I5023 Acute on chronic systolic (congestive) heart failure: Secondary | ICD-10-CM | POA: Diagnosis not present

## 2018-11-01 DIAGNOSIS — E1122 Type 2 diabetes mellitus with diabetic chronic kidney disease: Secondary | ICD-10-CM | POA: Diagnosis not present

## 2018-11-01 DIAGNOSIS — N182 Chronic kidney disease, stage 2 (mild): Secondary | ICD-10-CM | POA: Diagnosis not present

## 2018-11-01 DIAGNOSIS — I110 Hypertensive heart disease with heart failure: Secondary | ICD-10-CM | POA: Diagnosis not present

## 2018-11-01 NOTE — Telephone Encounter (Signed)
Copied from [place] [id number]. Topic: Quick Communication - Home Health Verbal Orders >> Nov 01, 2018  4:07 PM Windy Kalata wrote: Caller/Agency: Joelene Millin from [facility] Number: [phone] Requesting OT/PT/Skilled Nursing/Social Work: Need extension for PT Frequency: 2 times a week for 2 weeks, then 1 time a week for 1 week

## 2018-11-01 NOTE — Telephone Encounter (Signed)
Called Joelene Millin back at [facility] and left a voice message giving a verbal order for extension of PT as requested.

## 2018-11-06 DIAGNOSIS — I213 ST elevation (STEMI) myocardial infarction of unspecified site: Secondary | ICD-10-CM | POA: Diagnosis not present

## 2018-11-06 DIAGNOSIS — I2510 Atherosclerotic heart disease of native coronary artery without angina pectoris: Secondary | ICD-10-CM | POA: Diagnosis not present

## 2018-11-06 DIAGNOSIS — I110 Hypertensive heart disease with heart failure: Secondary | ICD-10-CM | POA: Diagnosis not present

## 2018-11-06 DIAGNOSIS — E1122 Type 2 diabetes mellitus with diabetic chronic kidney disease: Secondary | ICD-10-CM | POA: Diagnosis not present

## 2018-11-06 DIAGNOSIS — I5023 Acute on chronic systolic (congestive) heart failure: Secondary | ICD-10-CM | POA: Diagnosis not present

## 2018-11-06 DIAGNOSIS — N182 Chronic kidney disease, stage 2 (mild): Secondary | ICD-10-CM | POA: Diagnosis not present

## 2018-11-08 DIAGNOSIS — I2510 Atherosclerotic heart disease of native coronary artery without angina pectoris: Secondary | ICD-10-CM | POA: Diagnosis not present

## 2018-11-08 DIAGNOSIS — I213 ST elevation (STEMI) myocardial infarction of unspecified site: Secondary | ICD-10-CM | POA: Diagnosis not present

## 2018-11-08 DIAGNOSIS — E1122 Type 2 diabetes mellitus with diabetic chronic kidney disease: Secondary | ICD-10-CM | POA: Diagnosis not present

## 2018-11-08 DIAGNOSIS — I5023 Acute on chronic systolic (congestive) heart failure: Secondary | ICD-10-CM | POA: Diagnosis not present

## 2018-11-08 DIAGNOSIS — N182 Chronic kidney disease, stage 2 (mild): Secondary | ICD-10-CM | POA: Diagnosis not present

## 2018-11-08 DIAGNOSIS — I110 Hypertensive heart disease with heart failure: Secondary | ICD-10-CM | POA: Diagnosis not present

## 2018-11-10 ENCOUNTER — Ambulatory Visit: Payer: Medicare Other | Admitting: Pulmonary Disease

## 2018-11-13 ENCOUNTER — Telehealth: Payer: Self-pay

## 2018-11-13 DIAGNOSIS — N182 Chronic kidney disease, stage 2 (mild): Secondary | ICD-10-CM | POA: Diagnosis not present

## 2018-11-13 DIAGNOSIS — I110 Hypertensive heart disease with heart failure: Secondary | ICD-10-CM | POA: Diagnosis not present

## 2018-11-13 DIAGNOSIS — I2510 Atherosclerotic heart disease of native coronary artery without angina pectoris: Secondary | ICD-10-CM | POA: Diagnosis not present

## 2018-11-13 DIAGNOSIS — E1122 Type 2 diabetes mellitus with diabetic chronic kidney disease: Secondary | ICD-10-CM | POA: Diagnosis not present

## 2018-11-13 DIAGNOSIS — I5023 Acute on chronic systolic (congestive) heart failure: Secondary | ICD-10-CM | POA: Diagnosis not present

## 2018-11-13 DIAGNOSIS — I213 ST elevation (STEMI) myocardial infarction of unspecified site: Secondary | ICD-10-CM | POA: Diagnosis not present

## 2018-11-13 NOTE — Telephone Encounter (Signed)
Pt's wife called stating that patient was having left-side sinus pain that starts over left eye goes up head and around to left ear.  Pt has no fever.  Pt said that pain is a 6 out of 10 on pain scale.  Pt is having some sinus drainage and has bloody mucus coming out of left nostril when he blows his nose.  Pt wife believes it to be sinusitis since she says pt has it every year around this time. Pt scheduled to see PCP on tomorrow (11/14/18) at  11:45 am.

## 2018-11-14 ENCOUNTER — Ambulatory Visit: Payer: Medicare Other | Admitting: Internal Medicine

## 2018-11-14 ENCOUNTER — Ambulatory Visit (INDEPENDENT_AMBULATORY_CARE_PROVIDER_SITE_OTHER): Payer: Medicare Other | Admitting: Internal Medicine

## 2018-11-14 ENCOUNTER — Encounter: Payer: Self-pay | Admitting: Internal Medicine

## 2018-11-14 VITALS — BP 130/70 | HR 59 | Temp 98.2°F | Ht 68.0 in | Wt 197.8 lb

## 2018-11-14 DIAGNOSIS — J0111 Acute recurrent frontal sinusitis: Secondary | ICD-10-CM | POA: Diagnosis not present

## 2018-11-14 MED ORDER — DOXYCYCLINE HYCLATE 100 MG PO TABS: 100.0000 mg | ORAL_TABLET | Freq: Two times a day (BID) | ORAL | 0 refills | Status: DC

## 2018-11-14 NOTE — Progress Notes (Signed)
Pre visit review using our clinic review tool, if applicable. No additional management support is needed unless otherwise documented below in the visit note. 

## 2018-11-14 NOTE — Patient Instructions (Signed)
Sinusitis, Adult  Sinusitis is inflammation of your sinuses. Sinuses are hollow spaces in the bones around your face. Your sinuses are located:   Around your eyes.   In the middle of your forehead.   Behind your nose.   In your cheekbones.  Mucus normally drains out of your sinuses. When your nasal tissues become inflamed or swollen, mucus can become trapped or blocked. This allows bacteria, viruses, and fungi to grow, which leads to infection. Most infections of the sinuses are caused by a virus.  Sinusitis can develop quickly. It can last for up to 4 weeks (acute) or for more than 12 weeks (chronic). Sinusitis often develops after a cold.  What are the causes?  This condition is caused by anything that creates swelling in the sinuses or stops mucus from draining. This includes:   Allergies.   Asthma.   Infection from bacteria or viruses.   Deformities or blockages in your nose or sinuses.   Abnormal growths in the nose (nasal polyps).   Pollutants, such as chemicals or irritants in the air.   Infection from fungi (rare).  What increases the risk?  You are more likely to develop this condition if you:   Have a weak body defense system (immune system).   Do a lot of swimming or diving.   Overuse nasal sprays.   Smoke.  What are the signs or symptoms?  The main symptoms of this condition are pain and a feeling of pressure around the affected sinuses. Other symptoms include:   Stuffy nose or congestion.   Thick drainage from your nose.   Swelling and warmth over the affected sinuses.   Headache.   Upper toothache.   A cough that may get worse at night.   Extra mucus that collects in the throat or the back of the nose (postnasal drip).   Decreased sense of smell and taste.   Fatigue.   A fever.   Sore throat.   Bad breath.  How is this diagnosed?  This condition is diagnosed based on:   Your symptoms.   Your medical history.   A physical exam.   Tests to find out if your condition is  acute or chronic. This may include:  ? Checking your nose for nasal polyps.  ? Viewing your sinuses using a device that has a light (endoscope).  ? Testing for allergies or bacteria.  ? Imaging tests, such as an MRI or CT scan.  In rare cases, a bone biopsy may be done to rule out more serious types of fungal sinus disease.  How is this treated?  Treatment for sinusitis depends on the cause and whether your condition is chronic or acute.   If caused by a virus, your symptoms should go away on their own within 10 days. You may be given medicines to relieve symptoms. They include:  ? Medicines that shrink swollen nasal passages (topical intranasal decongestants).  ? Medicines that treat allergies (antihistamines).  ? A spray that eases inflammation of the nostrils (topical intranasal corticosteroids).  ? Rinses that help get rid of thick mucus in your nose (nasal saline washes).   If caused by bacteria, your health care provider may recommend waiting to see if your symptoms improve. Most bacterial infections will get better without antibiotic medicine. You may be given antibiotics if you have:  ? A severe infection.  ? A weak immune system.   If caused by narrow nasal passages or nasal polyps, you may need   to have surgery.  Follow these instructions at home:  Medicines   Take, use, or apply over-the-counter and prescription medicines only as told by your health care provider. These may include nasal sprays.   If you were prescribed an antibiotic medicine, take it as told by your health care provider. Do not stop taking the antibiotic even if you start to feel better.  Hydrate and humidify     Drink enough fluid to keep your urine pale yellow. Staying hydrated will help to thin your mucus.   Use a cool mist humidifier to keep the humidity level in your home above 50%.   Inhale steam for 10-15 minutes, 3-4 times a day, or as told by your health care provider. You can do this in the bathroom while a hot shower is  running.   Limit your exposure to cool or dry air.  Rest   Rest as much as possible.   Sleep with your head raised (elevated).   Make sure you get enough sleep each night.  General instructions     Apply a warm, moist washcloth to your face 3-4 times a day or as told by your health care provider. This will help with discomfort.   Wash your hands often with soap and water to reduce your exposure to germs. If soap and water are not available, use hand sanitizer.   Do not smoke. Avoid being around people who are smoking (secondhand smoke).   Keep all follow-up visits as told by your health care provider. This is important.  Contact a health care provider if:   You have a fever.   Your symptoms get worse.   Your symptoms do not improve within 10 days.  Get help right away if:   You have a severe headache.   You have persistent vomiting.   You have severe pain or swelling around your face or eyes.   You have vision problems.   You develop confusion.   Your neck is stiff.   You have trouble breathing.  Summary   Sinusitis is soreness and inflammation of your sinuses. Sinuses are hollow spaces in the bones around your face.   This condition is caused by nasal tissues that become inflamed or swollen. The swelling traps or blocks the flow of mucus. This allows bacteria, viruses, and fungi to grow, which leads to infection.   If you were prescribed an antibiotic medicine, take it as told by your health care provider. Do not stop taking the antibiotic even if you start to feel better.   Keep all follow-up visits as told by your health care provider. This is important.  This information is not intended to replace advice given to you by your health care provider. Make sure you discuss any questions you have with your health care provider.  Document Released: 08/30/2005 Document Revised: 01/30/2018 Document Reviewed: 01/30/2018  Elsevier Interactive Patient Education  2019 [employer]

## 2018-11-14 NOTE — Progress Notes (Signed)
Chief Complaint  Patient presents with  . Sinusitis   F/u with wife  1. Sinus pain left frontal temple and ear pain x 1-2 weeks nothing tried does I.e Tylenol does Flonase and Atrovent atrovent doing 2 x per day pain is 4/10 constant and MRI in the past with sinusitis +. He has bloody mucous coming from left nose    Review of Systems  Constitutional: Negative for weight loss.  HENT: Positive for congestion, hearing loss and sinus pain.        +PND   Eyes: Negative for blurred vision.  Respiratory: Negative for shortness of breath.   Cardiovascular: Negative for chest pain.  Gastrointestinal: Negative for abdominal pain.  Skin: Negative for rash.   Past Medical History:  Diagnosis Date  . Arthritis   . Atrial fibrillation ([place])   . CAD (coronary artery disease)    6 STENTS  . CHF (congestive heart failure) ([place])   . Chicken pox   . Colon polyp   . Corneal dystrophy    bilateral  . Crohn's disease ([place])   . Diabetes ([place]) 2002  . Dysrhythmia    A-FIB AND PAF  . GERD (gastroesophageal reflux disease)   . Gout   . History of kidney stones   . History of shingles   . Hyperlipemia   . Hypertension    2/2 b/l RAS >60% [place] 06/2018 s/p right stent   . Kidney stones   . Myocardial infarction ([place])    x4, last one in 2010  . Peripheral neuropathy   . Peripheral neuropathy   . Peripheral neuropathy   . PUD (peptic ulcer disease)   . Rectus sheath hematoma    02/18/18 8.9 x 4.5 cm then 02/19/18 7.9 x 4.5 cm   . Renal artery stenosis ([place])   . Renal artery stenosis ([place])   . Salzmann's nodular dystrophy 2012  . Sleep apnea   . Spinal stenosis    Past Surgical History:  Procedure Laterality Date  . ABLATION  2012  . APPLICATION VERTERBRAL DEFECT PROSTHETIC  05/01/2013  . ARTHRODESIS ANTERIOR LUMBAR SPINE  05/01/2013  . BACK SURGERY     lumbar fusion  . CARDIAC CATHETERIZATION    . CARDIAC ELECTROPHYSIOLOGY STUDY AND ABLATION    . CARDIAC SURGERY    . CARDIOVERSION    .  CHOLECYSTECTOMY    . COLONOSCOPY WITH PROPOFOL N/A 04/09/2016   Completed for chronic diarrhea.  Few scattered diverticuli.  No mucosal lesions.  Surgeon: Lollie Sails, MD;  Location: [facility] ENDOSCOPY;  Service: Endoscopy;  Laterality: N/A;  . CORNEAL EYE SURGERY Bilateral 07/28/11    09/22/2011  . CORONARY ANGIOPLASTY    . CORONARY ANGIOPLASTY WITH STENT PLACEMENT  03/28/2015   Distal 80% to normal Stent, Dilation Balloon  . CORONARY ARTERY BYPASS GRAFT     4 VESSELS  . CORONARY/GRAFT ACUTE MI REVASCULARIZATION N/A 09/16/2018   Procedure: Coronary/Graft Acute MI Revascularization;  Surgeon: Isaias Cowman, MD;  Location: [place] CV LAB;  Service: Cardiovascular;  Laterality: N/A;  . EYE SURGERY     bilateral cataract 2017 Dr. Megan Mans  . foot and ankle repair Right 2005  . FRACTURE SURGERY     ANKLE PLATE AND SCREWS  . GALLBLADER    . JOINT REPLACEMENT     left total hip 11/11/15  . JOINT REPLACEMENT     right total hip 12/2017 Dr. Rudene Christians   . KNEE ARTHROSCOPY Right   . LEFT HEART CATH AND CORONARY  ANGIOGRAPHY N/A 09/16/2018   Procedure: LEFT HEART CATH AND CORONARY ANGIOGRAPHY;  Surgeon: Isaias Cowman, MD;  Location: [place] CV LAB;  Service: Cardiovascular;  Laterality: N/A;  . LUMBAR SPINE FUSION ONE LEVEL  05/03/2013  . RENAL ARTERY STENT Right   . ROTATOR CUFF REPAIR Right 2006  . TONSILLECTOMY    . TOTAL HIP ARTHROPLASTY Left 11/11/2015   Procedure: TOTAL HIP ARTHROPLASTY ANTERIOR APPROACH;  Surgeon: Hessie Knows, MD;  Location: [facility] ORS;  Service: Orthopedics;  Laterality: Left;  . TOTAL HIP ARTHROPLASTY Right 12/13/2017   Procedure: TOTAL HIP ARTHROPLASTY ANTERIOR APPROACH;  Surgeon: Hessie Knows, MD;  Location: [facility] ORS;  Service: Orthopedics;  Laterality: Right;  . TRANSCATH PLACEMENT INTRAVASCULAR STENT LEG  03/2015   Family History  Problem Relation Age of Onset  . CVA Father   . Stroke Father   . Lung cancer Mother   . Arthritis Mother   . Stomach  cancer Sister   . Colon cancer Sister   . Diabetes Brother    Social History   Socioeconomic History  . Marital status: Married    Spouse name: Not on file  . Number of children: Not on file  . Years of education: Not on file  . Highest education level: Not on file  Occupational History  . Not on file  Social Needs  . Financial resource strain: Not very hard  . Food insecurity:    Worry: Never true    Inability: Never true  . Transportation needs:    Medical: No    Non-medical: No  Tobacco Use  . Smoking status: Former Smoker    Packs/day: 1.00    Years: 3.00    Pack years: 3.00    Types: Cigarettes    Last attempt to quit: 06/30/1962    Years since quitting: 56.4  . Smokeless tobacco: Former Systems developer    Types: [place] date: 12/05/1968  Substance and Sexual Activity  . Alcohol use: No  . Drug use: No  . Sexual activity: Not Currently  Lifestyle  . Physical activity:    Days per week: 2 days    Minutes per session: 30 min  . Stress: Not at all  Relationships  . Social connections:    Talks on phone: Patient refused    Gets together: Patient refused    Attends religious service: Patient refused    Active member of club or organization: Patient refused    Attends meetings of clubs or organizations: Patient refused    Relationship status: Patient refused  . Intimate partner violence:    Fear of current or ex partner: No    Emotionally abused: No    Physically abused: No    Forced sexual activity: No  Other Topics Concern  . Not on file  Social History Narrative   Married    No outpatient medications have been marked as taking for the 11/14/18 encounter (Office Visit) with McLean-Scocuzza, Nino Glow, MD.   Allergies  Allergen Reactions  . Allopurinol Diarrhea, Other (See Comments) and Nausea And Vomiting    Other Reaction: GI Upset  . Atenolol Other (See Comments)    Other Reaction: bradycardia  . Atorvastatin Other (See Comments)    Other Reaction: muscle  aches  . Ramipril Rash  . Rosuvastatin Other (See Comments)    Muscle aches  . Simvastatin Other (See Comments)    Other Reaction: MUSCLE ACHES (Zocor)  . Valsartan Other (See Comments)    Other Reaction:  facial swelling  . Cardizem [Diltiazem] Rash   Recent Results (from the past 2160 hour(s))  I-STAT creatinine     Status: None   Collection Time: 08/22/18  8:39 AM  Result Value Ref Range   Creatinine, Ser 1.10 0.61 - 1.24 mg/dL  CBC with Differential/Platelet     Status: Abnormal   Collection Time: 09/16/18  1:24 AM  Result Value Ref Range   WBC 12.2 (H) 4.0 - 10.5 K/uL   RBC 4.26 4.22 - 5.81 MIL/uL   Hemoglobin 13.8 13.0 - 17.0 g/dL   HCT 41.3 39.0 - 52.0 %   MCV 96.9 80.0 - 100.0 fL   MCH 32.4 26.0 - 34.0 pg   MCHC 33.4 30.0 - 36.0 g/dL   RDW 13.5 11.5 - 15.5 %   Platelets 266 150 - 400 K/uL   nRBC 0.0 0.0 - 0.2 %   Neutrophils Relative % 85 %   Neutro Abs 10.4 (H) 1.7 - 7.7 K/uL   Lymphocytes Relative 9 %   Lymphs Abs 1.1 0.7 - 4.0 K/uL   Monocytes Relative 5 %   Monocytes Absolute 0.6 0.1 - 1.0 K/uL   Eosinophils Relative 0 %   Eosinophils Absolute 0.0 0.0 - 0.5 K/uL   Basophils Relative 0 %   Basophils Absolute 0.0 0.0 - 0.1 K/uL   Immature Granulocytes 1 %   Abs Immature Granulocytes 0.09 (H) 0.00 - 0.07 K/uL    Comment: Performed at [facility], [address] [place], [place] [address]  Protime-INR     Status: None   Collection Time: 09/16/18  1:24 AM  Result Value Ref Range   Prothrombin Time 14.0 11.4 - 15.2 seconds   INR 1.09     Comment: Performed at [facility], [address] [place], [place] [address]  APTT     Status: None   Collection Time: 09/16/18  1:24 AM  Result Value Ref Range   aPTT 30 24 - 36 seconds    Comment: Performed at [facility], [address] [place], [place] [address]  Comprehensive metabolic panel     Status: Abnormal   Collection Time: 09/16/18  1:24 AM  Result Value Ref Range   Sodium  139 135 - 145 mmol/L   Potassium 3.4 (L) 3.5 - 5.1 mmol/L   Chloride 109 98 - 111 mmol/L   CO2 24 22 - 32 mmol/L   Glucose, Bld 108 (H) 70 - 99 mg/dL   BUN 14 8 - 23 mg/dL   Creatinine, Ser 0.44 (L) 0.61 - 1.24 mg/dL   Calcium 9.3 8.9 - 10.3 mg/dL   Total Protein 7.5 6.5 - 8.1 g/dL   Albumin 4.5 3.5 - 5.0 g/dL   AST 19 15 - 41 U/L   ALT 18 0 - 44 U/L   Alkaline Phosphatase 73 38 - 126 U/L   Total Bilirubin 0.2 (L) 0.3 - 1.2 mg/dL   GFR calc non Af Amer >60 >60 mL/min   GFR calc Af Amer >60 >60 mL/min   Anion gap 6 5 - 15    Comment: Performed at [facility], [address] [place], [place] [address]  Troponin I - ONCE - STAT     Status: None   Collection Time: 09/16/18  1:24 AM  Result Value Ref Range   Troponin I <0.03 <0.03 ng/mL    Comment: Performed at [facility], [address], [place], [place] [address]  Lipid panel  Status: None   Collection Time: 09/16/18  1:24 AM  Result Value Ref Range   Cholesterol 131 0 - 200 mg/dL   Triglycerides 45 <150 mg/dL   HDL 57 >40 mg/dL   Total CHOL/HDL Ratio 2.3 RATIO   VLDL 9 0 - 40 mg/dL   LDL Cholesterol 65 0 - 99 mg/dL    Comment:        Total Cholesterol/HDL:CHD Risk Coronary Heart Disease Risk Table                     Men   Women  1/2 Average Risk   3.4   3.3  Average Risk       5.0   4.4  2 X Average Risk   9.6   7.1  3 X Average Risk  23.4   11.0        Use the calculated Patient Ratio above and the CHD Risk Table to determine the patient's CHD Risk.        ATP III CLASSIFICATION (LDL):  <100     mg/dL   Optimal  100-129  mg/dL   Near or Above                    Optimal  130-159  mg/dL   Borderline  160-189  mg/dL   High  >190     mg/dL   Very High Performed at [facility], [address] [place], [place] [address]   [place], [place] [address]     Status: Abnormal   Collection Time: 09/16/18  1:55 AM  Result Value Ref Range   Sodium 136 135 - 145 mmol/L   Potassium 4.9 3.5 - 5.1  mmol/L   Chloride 99 98 - 111 mmol/L   BUN 22 8 - 23 mg/dL   Creatinine, Ser 1.10 0.61 - 1.24 mg/dL   Glucose, Bld 298 (H) 70 - 99 mg/dL   Calcium, Ion 1.19 1.15 - 1.40 mmol/L   TCO2 23 22 - 32 mmol/L   Hemoglobin 13.9 13.0 - 17.0 g/dL   HCT 41.0 39.0 - 52.0 %  POCT Activated clotting time     Status: None   Collection Time: 09/16/18  2:21 AM  Result Value Ref Range   Activated Clotting Time 329 seconds  MRSA PCR Screening     Status: None   Collection Time: 09/16/18  3:46 AM  Result Value Ref Range   MRSA by PCR NEGATIVE NEGATIVE    Comment:        The GeneXpert MRSA Assay (FDA approved for NASAL specimens only), is one component of a comprehensive MRSA colonization surveillance program. It is not intended to diagnose MRSA infection nor to guide or monitor treatment for MRSA infections. Performed at [facility], [address] [place], [place] [address]   Glucose, capillary     Status: Abnormal   Collection Time: 09/16/18  3:49 AM  Result Value Ref Range   Glucose-Capillary 289 (H) 70 - 99 mg/dL  Troponin I - Once     Status: Abnormal   Collection Time: 09/16/18  5:20 AM  Result Value Ref Range   Troponin I 0.38 (HH) <0.03 ng/mL    Comment: CRITICAL RESULT CALLED TO, READ BACK BY AND VERIFIED WITH MISTY CAUSEY ON 09/16/2018 AT 0618 QSD Performed at [facility], [address] [place], [place] [address]   Glucose, capillary     Status: Abnormal   Collection Time: 09/16/18  7:49 AM  Result Value Ref Range   Glucose-Capillary 215 (H) 70 - 99 mg/dL  Troponin I - Once     Status: Abnormal   Collection Time: 09/16/18  8:50 AM  Result Value Ref Range   Troponin I 0.70 (HH) <0.03 ng/mL    Comment: CRITICAL VALUE NOTED. VALUE IS CONSISTENT WITH PREVIOUSLY REPORTED/CALLED VALUE.PMF Performed at [facility], [address] [place], [place] [address]   ECHOCARDIOGRAM COMPLETE     Status: None   Collection Time: 09/16/18 10:10 AM  Result  Value Ref Range   Weight 3,301.61 oz   Height 68 in   BP 124/64 mmHg  Glucose, capillary     Status: Abnormal   Collection Time: 09/16/18 11:21 AM  Result Value Ref Range   Glucose-Capillary 241 (H) 70 - 99 mg/dL  Glucose, capillary     Status: Abnormal   Collection Time: 09/16/18  4:01 PM  Result Value Ref Range   Glucose-Capillary 216 (H) 70 - 99 mg/dL  Glucose, capillary     Status: Abnormal   Collection Time: 09/16/18  9:16 PM  Result Value Ref Range   Glucose-Capillary 285 (H) 70 - 99 mg/dL  Basic metabolic panel     Status: Abnormal   Collection Time: 09/17/18  5:00 AM  Result Value Ref Range   Sodium 132 (L) 135 - 145 mmol/L   Potassium 3.7 3.5 - 5.1 mmol/L   Chloride 98 98 - 111 mmol/L   CO2 24 22 - 32 mmol/L   Glucose, Bld 206 (H) 70 - 99 mg/dL   BUN 21 8 - 23 mg/dL   Creatinine, Ser 0.90 0.61 - 1.24 mg/dL   Calcium 8.6 (L) 8.9 - 10.3 mg/dL   GFR calc non Af Amer >60 >60 mL/min   GFR calc Af Amer >60 >60 mL/min   Anion gap 10 5 - 15    Comment: Performed at [facility], [address] [place], [place] [address]  Glucose, capillary     Status: Abnormal   Collection Time: 09/17/18  8:03 AM  Result Value Ref Range   Glucose-Capillary 167 (H) 70 - 99 mg/dL  Glucose, capillary     Status: Abnormal   Collection Time: 09/17/18 11:22 AM  Result Value Ref Range   Glucose-Capillary 255 (H) 70 - 99 mg/dL   Objective  Body mass index is 30.08 kg/m. Wt Readings from Last 3 Encounters:  11/14/18 197 lb 12.8 oz (89.7 kg)  10/05/18 195 lb 12.8 oz (88.8 kg)  09/17/18 202 lb 6.4 oz (91.8 kg)   Temp Readings from Last 3 Encounters:  11/14/18 98.2 F (36.8 C) (Oral)  10/05/18 97.7 F (36.5 C) (Oral)  09/17/18 98.1 F (36.7 C) (Oral)   BP Readings from Last 3 Encounters:  11/14/18 130/70  10/05/18 120/60  09/17/18 (!) 148/60   Pulse Readings from Last 3 Encounters:  11/14/18 (!) 59  10/05/18 (!) 56  09/17/18 64    Physical Exam Vitals signs and  nursing note reviewed.  Constitutional:      Appearance: Normal appearance. He is well-developed and well-groomed.  HENT:     Head: Normocephalic and atraumatic.     Nose:     Right Sinus: No maxillary sinus tenderness or frontal sinus tenderness.     Left Sinus: Frontal sinus tenderness present. No maxillary sinus tenderness.     Mouth/Throat:     Mouth: Mucous membranes are moist.     Pharynx: Oropharynx is clear.  Eyes:  Conjunctiva/sclera: Conjunctivae normal.     Pupils: Pupils are equal, round, and reactive to light.  Cardiovascular:     Rate and Rhythm: Normal rate and regular rhythm.     Heart sounds: Normal heart sounds.  Pulmonary:     Effort: Pulmonary effort is normal.     Breath sounds: Normal breath sounds.  Skin:    General: Skin is warm and dry.  Neurological:     General: No focal deficit present.     Mental Status: He is alert and oriented to person, place, and time. Mental status is at baseline.     Gait: Gait normal.  Psychiatric:        Attention and Perception: Attention and perception normal.        Mood and Affect: Mood and affect normal.        Speech: Speech normal.        Behavior: Behavior normal. Behavior is cooperative.        Thought Content: Thought content normal.        Cognition and Memory: Cognition and memory normal.        Judgment: Judgment normal.     Assessment   1. Sinusitis, allergies and PND Plan  1. Xyzal, singulair, flonase and atrovent  Doxycycline bid x 7-10 days call back if not better  Provider: Dr. Olivia Mackie McLean-Scocuzza-Internal Medicine

## 2018-11-16 DIAGNOSIS — I110 Hypertensive heart disease with heart failure: Secondary | ICD-10-CM | POA: Diagnosis not present

## 2018-11-16 DIAGNOSIS — I5023 Acute on chronic systolic (congestive) heart failure: Secondary | ICD-10-CM | POA: Diagnosis not present

## 2018-11-16 DIAGNOSIS — I2510 Atherosclerotic heart disease of native coronary artery without angina pectoris: Secondary | ICD-10-CM | POA: Diagnosis not present

## 2018-11-16 DIAGNOSIS — N182 Chronic kidney disease, stage 2 (mild): Secondary | ICD-10-CM | POA: Diagnosis not present

## 2018-11-16 DIAGNOSIS — E1122 Type 2 diabetes mellitus with diabetic chronic kidney disease: Secondary | ICD-10-CM | POA: Diagnosis not present

## 2018-11-16 DIAGNOSIS — I213 ST elevation (STEMI) myocardial infarction of unspecified site: Secondary | ICD-10-CM | POA: Diagnosis not present

## 2018-11-20 DIAGNOSIS — I213 ST elevation (STEMI) myocardial infarction of unspecified site: Secondary | ICD-10-CM | POA: Diagnosis not present

## 2018-11-20 DIAGNOSIS — I110 Hypertensive heart disease with heart failure: Secondary | ICD-10-CM | POA: Diagnosis not present

## 2018-11-20 DIAGNOSIS — E1122 Type 2 diabetes mellitus with diabetic chronic kidney disease: Secondary | ICD-10-CM | POA: Diagnosis not present

## 2018-11-20 DIAGNOSIS — I2510 Atherosclerotic heart disease of native coronary artery without angina pectoris: Secondary | ICD-10-CM | POA: Diagnosis not present

## 2018-11-20 DIAGNOSIS — N182 Chronic kidney disease, stage 2 (mild): Secondary | ICD-10-CM | POA: Diagnosis not present

## 2018-11-20 DIAGNOSIS — I5023 Acute on chronic systolic (congestive) heart failure: Secondary | ICD-10-CM | POA: Diagnosis not present

## 2018-11-27 DIAGNOSIS — E1142 Type 2 diabetes mellitus with diabetic polyneuropathy: Secondary | ICD-10-CM | POA: Diagnosis not present

## 2018-12-01 ENCOUNTER — Ambulatory Visit: Payer: Medicare Other | Admitting: Pulmonary Disease

## 2018-12-01 NOTE — Progress Notes (Deleted)
Synopsis: Referred in October 2019 for productive cough and dyspnea; had pneumonia age 73 and has bronchiectasis and granuloma seen in left lower lobe on chest imaging.  Subjective:   PATIENT ID: Chase Cabrera GENDER: male DOB: 11/19/1945, MRN: [id number]   HPI  No chief complaint on file.  ***  Past Medical History:  Diagnosis Date  . Arthritis   . Atrial fibrillation ([place])   . CAD (coronary artery disease)    6 STENTS  . CHF (congestive heart failure) ([place])   . Chicken pox   . Colon polyp   . Corneal dystrophy    bilateral  . Crohn's disease ([place])   . Diabetes ([place]) 2002  . Dysrhythmia    A-FIB AND PAF  . GERD (gastroesophageal reflux disease)   . Gout   . History of kidney stones   . History of shingles   . Hyperlipemia   . Hypertension    2/2 b/l RAS >60% [place] 06/2018 s/p right stent   . Kidney stones   . Myocardial infarction ([place])    x4, last one in 2010  . Peripheral neuropathy   . Peripheral neuropathy   . Peripheral neuropathy   . PUD (peptic ulcer disease)   . Rectus sheath hematoma    02/18/18 8.9 x 4.5 cm then 02/19/18 7.9 x 4.5 cm   . Renal artery stenosis ([place])   . Renal artery stenosis ([place])   . Salzmann's nodular dystrophy 2012  . Sleep apnea   . Spinal stenosis       Review of Systems  Constitutional: Negative for chills, fever, malaise/fatigue and weight loss.  HENT: Negative for congestion, nosebleeds, sinus pain and sore throat.   Eyes: Negative for photophobia, pain and discharge.  Respiratory: Positive for cough, sputum production and shortness of breath. Negative for hemoptysis and wheezing.   Cardiovascular: Negative for chest pain, palpitations, orthopnea and leg swelling.  Gastrointestinal: Positive for diarrhea and heartburn. Negative for abdominal pain, constipation, nausea and vomiting.  Genitourinary: Positive for frequency. Negative for dysuria, hematuria and urgency.  Musculoskeletal: Positive for back pain. Negative for joint  pain, myalgias and neck pain.  Skin: Negative for itching and rash.  Neurological: Positive for weakness. Negative for tingling, tremors, sensory change, speech change, focal weakness, seizures and headaches.  Endo/Heme/Allergies: Positive for environmental allergies. Bruises/bleeds easily.  Psychiatric/Behavioral: Negative for memory loss, substance abuse and suicidal ideas. The patient is not nervous/anxious.       Objective:  Physical Exam   There were no vitals filed for this visit.  ***   CBC    Component Value Date/Time   WBC 12.2 (H) 09/16/2018 0124   RBC 4.26 09/16/2018 0124   HGB 13.9 09/16/2018 0155   HCT 41.0 09/16/2018 0155   PLT 266 09/16/2018 0124   MCV 96.9 09/16/2018 0124   MCH 32.4 09/16/2018 0124   MCHC 33.4 09/16/2018 0124   RDW 13.5 09/16/2018 0124   LYMPHSABS 1.1 09/16/2018 0124   MONOABS 0.6 09/16/2018 0124   EOSABS 0.0 09/16/2018 0124   BASOSABS 0.0 09/16/2018 0124   BASOSABS 0 06/18/2014 1330     Chest imaging: June 13, 2018 CT angiogram chest images independently reviewed:  no pulmonary embolism, some air trapping noted, no infiltrate, I question whether or not there is mild bronchiectasis in the base of the left lung.  There is a calcified nodule seen in the left lower lobe these images were compared to multiple CT abdomen images with lung windows which  I was able to review from this year.  PFT:  Labs:  Path:  Echo:  Heart Catheterization:       Assessment & Plan:   No diagnosis found.  Discussion: ***     Current Outpatient Medications:  .  amitriptyline (ELAVIL) 50 MG tablet, Take 50 mg by mouth at bedtime as needed. , Disp: , Rfl: 1 .  amLODipine (NORVASC) 5 MG tablet, Take 1 tablet (5 mg total) by mouth 2 (two) times daily. If blood pressure >140/>90 make take another 1 pill of 5 mg, Disp: 180 tablet, Rfl: 3 .  aspirin EC 81 MG EC tablet, Take 1 tablet (81 mg total) by mouth daily., Disp: 30 tablet, Rfl: 0 .   carvedilol (COREG) 25 MG tablet, Take 1 tablet (25 mg total) by mouth 2 (two) times daily with a meal., Disp: 180 tablet, Rfl: 3 .  Cholecalciferol 50000 units TABS, Take 1 tablet by mouth once a week., Disp: 13 tablet, Rfl: 1 .  cloNIDine (CATAPRES) 0.2 MG tablet, Take by mouth 2 (two) times daily. , Disp: , Rfl: 11 .  colchicine 0.6 MG tablet, TAKE 1 TABLET BY MOUTH ONCE DAILY, Disp: 60 tablet, Rfl: 0 .  diclofenac sodium (VOLTAREN) 1 % GEL, Apply 2 g topically 4 (four) times daily as needed (for pain). , Disp: , Rfl:  .  dicyclomine (BENTYL) 10 MG capsule, Take 10 mg by mouth 3 (three) times daily before meals. , Disp: , Rfl:  .  doxycycline (VIBRA-TABS) 100 MG tablet, Take 1 tablet (100 mg total) by mouth 2 (two) times daily. With food x 7-10 days, Disp: 20 tablet, Rfl: 0 .  fluticasone (FLONASE) 50 MCG/ACT nasal spray, Place 2 sprays into both nostrils daily., Disp: 16 g, Rfl: 11 .  furosemide (LASIX) 40 MG tablet, Take 40 mg by mouth 2 (two) times daily. , Disp: , Rfl:  .  gabapentin (NEURONTIN) 300 MG capsule, take 2 capsules (671m) by mouth three times a day, Disp: 180 capsule, Rfl: 1 .  hydrALAZINE (APRESOLINE) 100 MG tablet, TAKE 100 MG BY MOUTH THREE TIMES A DAY, Disp: 270 tablet, Rfl: 1 .  insulin lispro (HUMALOG) 100 UNIT/ML KiwkPen, Inject 14-32 Units into the skin 3 (three) times daily. (according to sliding scale - max 96u over 24 hours), Disp: , Rfl:  .  ipratropium (ATROVENT) 0.06 % nasal spray, Place 2 sprays into both nostrils 4 (four) times daily., Disp: 15 mL, Rfl: 12 .  isosorbide mononitrate (IMDUR) 120 MG 24 hr tablet, TAKE 1 TABLET BY MOUTH EVERY DAY, Disp: 90 tablet, Rfl: 1 .  levocetirizine (XYZAL) 5 MG tablet, Take 1 tablet (5 mg total) by mouth every evening., Disp: , Rfl:  .  montelukast (SINGULAIR) 10 MG tablet, Take 1 tablet (10 mg total) by mouth at bedtime., Disp: 90 tablet, Rfl: 3 .  Multiple Vitamin (MULTI-VITAMINS) TABS, Take 1 tablet by mouth daily., Disp: ,  Rfl:  .  nitroGLYCERIN (NITROSTAT) 0.4 MG SL tablet, Place 1 tablet (0.4 mg total) under the tongue every 5 (five) minutes x 3 doses as needed. For chest pain.  Call 911 if no relief after 3 tablets, Disp: 30 tablet, Rfl: 11 .  omega-3 acid ethyl esters (LOVAZA) 1 g capsule, Take 2 capsules (2 g total) by mouth 2 (two) times daily., Disp: 360 capsule, Rfl: 3 .  omeprazole (PRILOSEC) 40 MG capsule, Take 40 mg by mouth 2 (two) times daily. , Disp: , Rfl:  .  pravastatin (PRAVACHOL)  40 MG tablet, Take 1 tablet (40 mg total) by mouth daily., Disp: 90 tablet, Rfl: 3 .  probenecid (BENEMID) 500 MG tablet, Take 500 mg by mouth 2 (two) times daily. , Disp: , Rfl: 0 .  sodium chloride HYPERTONIC 3 % nebulizer solution, Take by nebulization 2 (two) times daily., Disp: 300 mL, Rfl: 11 .  spironolactone (ALDACTONE) 25 MG tablet, Take 12.5 mg by mouth daily. , Disp: , Rfl:  .  TRESIBA FLEXTOUCH 200 UNIT/ML SOPN, Inject 90 Units into the skin at bedtime., Disp: , Rfl: 11 .  vitamin C (ASCORBIC ACID) 500 MG tablet, Take 500 mg by mouth daily. , Disp: , Rfl:  .  Vitamin E 400 units TABS, Take 400 Units by mouth 2 (two) times daily., Disp: , Rfl:

## 2018-12-14 DIAGNOSIS — E1129 Type 2 diabetes mellitus with other diabetic kidney complication: Secondary | ICD-10-CM | POA: Diagnosis not present

## 2018-12-14 DIAGNOSIS — E1142 Type 2 diabetes mellitus with diabetic polyneuropathy: Secondary | ICD-10-CM | POA: Diagnosis not present

## 2018-12-14 DIAGNOSIS — Z794 Long term (current) use of insulin: Secondary | ICD-10-CM | POA: Diagnosis not present

## 2018-12-14 DIAGNOSIS — E1159 Type 2 diabetes mellitus with other circulatory complications: Secondary | ICD-10-CM | POA: Diagnosis not present

## 2018-12-14 DIAGNOSIS — R809 Proteinuria, unspecified: Secondary | ICD-10-CM | POA: Diagnosis not present

## 2019-01-02 ENCOUNTER — Other Ambulatory Visit: Payer: Self-pay | Admitting: Internal Medicine

## 2019-01-02 DIAGNOSIS — I10 Essential (primary) hypertension: Secondary | ICD-10-CM

## 2019-01-02 MED ORDER — HYDRALAZINE HCL 100 MG PO TABS: ORAL_TABLET | ORAL | 3 refills | Status: AC

## 2019-01-04 ENCOUNTER — Encounter: Payer: Self-pay | Admitting: *Deleted

## 2019-01-25 DIAGNOSIS — I701 Atherosclerosis of renal artery: Secondary | ICD-10-CM | POA: Diagnosis not present

## 2019-02-14 DIAGNOSIS — M1612 Unilateral primary osteoarthritis, left hip: Secondary | ICD-10-CM | POA: Diagnosis not present

## 2019-02-14 DIAGNOSIS — Z96641 Presence of right artificial hip joint: Secondary | ICD-10-CM | POA: Diagnosis not present

## 2019-02-15 ENCOUNTER — Ambulatory Visit (INDEPENDENT_AMBULATORY_CARE_PROVIDER_SITE_OTHER): Payer: Medicare Other | Admitting: Internal Medicine

## 2019-02-15 ENCOUNTER — Other Ambulatory Visit: Payer: Self-pay

## 2019-02-15 DIAGNOSIS — I10 Essential (primary) hypertension: Secondary | ICD-10-CM

## 2019-02-15 DIAGNOSIS — I6523 Occlusion and stenosis of bilateral carotid arteries: Secondary | ICD-10-CM

## 2019-02-15 DIAGNOSIS — R0982 Postnasal drip: Secondary | ICD-10-CM | POA: Diagnosis not present

## 2019-02-15 DIAGNOSIS — D72829 Elevated white blood cell count, unspecified: Secondary | ICD-10-CM | POA: Diagnosis not present

## 2019-02-15 DIAGNOSIS — I2510 Atherosclerotic heart disease of native coronary artery without angina pectoris: Secondary | ICD-10-CM | POA: Diagnosis not present

## 2019-02-15 DIAGNOSIS — R946 Abnormal results of thyroid function studies: Secondary | ICD-10-CM

## 2019-02-15 DIAGNOSIS — N400 Enlarged prostate without lower urinary tract symptoms: Secondary | ICD-10-CM | POA: Insufficient documentation

## 2019-02-15 DIAGNOSIS — E1142 Type 2 diabetes mellitus with diabetic polyneuropathy: Secondary | ICD-10-CM

## 2019-02-15 DIAGNOSIS — R197 Diarrhea, unspecified: Secondary | ICD-10-CM | POA: Diagnosis not present

## 2019-02-15 DIAGNOSIS — I519 Heart disease, unspecified: Secondary | ICD-10-CM | POA: Insufficient documentation

## 2019-02-15 DIAGNOSIS — R400 Somnolence: Secondary | ICD-10-CM | POA: Diagnosis not present

## 2019-02-15 DIAGNOSIS — F0390 Unspecified dementia without behavioral disturbance: Secondary | ICD-10-CM | POA: Insufficient documentation

## 2019-02-15 NOTE — Progress Notes (Signed)
Telephone Note  I connected with Chase Cabrera  on 02/15/19 at  2:12 PM EDT by a telephone and verified that I am speaking with the correct person using two identifiers.  Location patient: home Location provider:work  Persons participating in the virtual visit: patient, provider, pts wife Chase Cabrera  I discussed the limitations of evaluation and management by telemedicine and the availability of in person appointments. The patient expressed understanding and agreed to proceed.   HPI: 1. Increased daytime fatigue/sleepiness per wife and he sleeps throughout the night as well-pt declines to use bipap for prev. Dx of OSA in the past. His wife also thinks his sleepiness is due to his diagnosis of dementia 2. CAD echo 0/01/3975 with mild systolic dysfunction with EF 45-50% AK/HK and mild MR-denies chest pain, sob. His wife feels like he has gained weight though his scale is broken and he is unable to use her scale. She feels his abdomen is bigger which indicates he is gaining weight and he denies and normally does not get leg swelling 3. C/o PND not really using Atrovent multiple times per day  4. DM 2 A1C 7.3 11/27/2018 f/u [facility] on Tresiba 90 units qd and SSI per endocrine 5. S/p right total hip he saw ortho Dr. Rudene Christians 02/14/2019 with f/u prn no issues.  6. HTN with  (renovascular HTN) BP 02/14/2019 130/66 at Dr. Rudene Christians appt on norvasc 5 mg, Coreg 25 mg bid, clonidine 0.2 bid, lasix 40 mg bid, hydralazine 100 tid, imdur 120 mg qd, spironolactone 12.5 mg qd   ROS: See pertinent positives and negatives per HPI. HEENT: denies sinus issues, c/o +dry mouth but biotene helps, + PND not really using atrovent but will try to use  GI: denies diarrhea  Past Medical History:  Diagnosis Date  . Arthritis   . Atrial fibrillation ([place])   . CAD (coronary artery disease)    6 STENTS  . CHF (congestive heart failure) ([place])   . Chicken pox   . Colon polyp   . Corneal dystrophy    bilateral  . Crohn's disease  ([place])   . Diabetes ([place]) 2002  . Dysrhythmia    A-FIB AND PAF  . GERD (gastroesophageal reflux disease)   . Gout   . History of kidney stones   . History of shingles   . Hyperlipemia   . Hypertension    2/2 b/l RAS >60% [place] 06/2018 s/p right stent   . Kidney stones   . Myocardial infarction ([place])    x4, last one in 2010  . Peripheral neuropathy   . Peripheral neuropathy   . Peripheral neuropathy   . PUD (peptic ulcer disease)   . Rectus sheath hematoma    02/18/18 8.9 x 4.5 cm then 02/19/18 7.9 x 4.5 cm   . Renal artery stenosis ([place])   . Renal artery stenosis ([place])   . Salzmann's nodular dystrophy 2012  . Sleep apnea   . Spinal stenosis     Past Surgical History:  Procedure Laterality Date  . ABLATION  2012  . APPLICATION VERTERBRAL DEFECT PROSTHETIC  05/01/2013  . ARTHRODESIS ANTERIOR LUMBAR SPINE  05/01/2013  . BACK SURGERY     lumbar fusion  . CARDIAC CATHETERIZATION    . CARDIAC ELECTROPHYSIOLOGY STUDY AND ABLATION    . CARDIAC SURGERY    . CARDIOVERSION    . CHOLECYSTECTOMY    . COLONOSCOPY WITH PROPOFOL N/A 04/09/2016   Completed for chronic diarrhea.  Few scattered diverticuli.  No  mucosal lesions.  Surgeon: Lollie Sails, MD;  Location: [facility] ENDOSCOPY;  Service: Endoscopy;  Laterality: N/A;  . CORNEAL EYE SURGERY Bilateral 07/28/11    09/22/2011  . CORONARY ANGIOPLASTY    . CORONARY ANGIOPLASTY WITH STENT PLACEMENT  03/28/2015   Distal 80% to normal Stent, Dilation Balloon  . CORONARY ARTERY BYPASS GRAFT     4 VESSELS  . CORONARY/GRAFT ACUTE MI REVASCULARIZATION N/A 09/16/2018   Procedure: Coronary/Graft Acute MI Revascularization;  Surgeon: Isaias Cowman, MD;  Location: [place] [facility];  Service: Cardiovascular;  Laterality: N/A;  . EYE SURGERY     bilateral cataract 2017 Dr. Megan Mans  . foot and ankle repair Right 2005  . FRACTURE SURGERY     ANKLE PLATE AND SCREWS  . GALLBLADER    . JOINT REPLACEMENT     left total hip 11/11/15  . JOINT  REPLACEMENT     right total hip 12/2017 Dr. Rudene Christians   . KNEE ARTHROSCOPY Right   . LEFT HEART CATH AND CORONARY ANGIOGRAPHY N/A 09/16/2018   Procedure: LEFT HEART CATH AND CORONARY ANGIOGRAPHY;  Surgeon: Isaias Cowman, MD;  Location: [place] [facility];  Service: Cardiovascular;  Laterality: N/A;  . LUMBAR SPINE FUSION ONE LEVEL  05/03/2013  . RENAL ARTERY STENT Right   . ROTATOR CUFF REPAIR Right 2006  . TONSILLECTOMY    . TOTAL HIP ARTHROPLASTY Left 11/11/2015   Procedure: TOTAL HIP ARTHROPLASTY ANTERIOR APPROACH;  Surgeon: Hessie Knows, MD;  Location: [facility] ORS;  Service: Orthopedics;  Laterality: Left;  . TOTAL HIP ARTHROPLASTY Right 12/13/2017   Procedure: TOTAL HIP ARTHROPLASTY ANTERIOR APPROACH;  Surgeon: Hessie Knows, MD;  Location: [facility] ORS;  Service: Orthopedics;  Laterality: Right;  . TRANSCATH PLACEMENT INTRAVASCULAR STENT LEG  03/2015    Family History  Problem Relation Age of Onset  . CVA Father   . Stroke Father   . Lung cancer Mother   . Arthritis Mother   . Stomach cancer Sister   . Colon cancer Sister   . Diabetes Brother     SOCIAL HX: married    Current Outpatient Medications:  .  amitriptyline (ELAVIL) 50 MG tablet, Take 0.5 tablets (25 mg total) by mouth at bedtime as needed., Disp: , Rfl:  .  amLODipine (NORVASC) 5 MG tablet, Take 1 tablet (5 mg total) by mouth 2 (two) times daily. If blood pressure >140/>90 make take another 1 pill of 5 mg, Disp: 180 tablet, Rfl: 3 .  aspirin EC 81 MG EC tablet, Take 1 tablet (81 mg total) by mouth daily., Disp: 30 tablet, Rfl: 0 .  carvedilol (COREG) 25 MG tablet, Take 1 tablet (25 mg total) by mouth 2 (two) times daily with a meal., Disp: 180 tablet, Rfl: 3 .  Cholecalciferol 50000 units TABS, Take 1 tablet by mouth once a week., Disp: 13 tablet, Rfl: 1 .  cloNIDine (CATAPRES) 0.2 MG tablet, Take by mouth 2 (two) times daily. , Disp: , Rfl: 11 .  clopidogrel (PLAVIX) 75 MG tablet, Take 75 mg by mouth daily., Disp: , Rfl:   .  colchicine 0.6 MG tablet, TAKE 1 TABLET BY MOUTH ONCE DAILY, Disp: 60 tablet, Rfl: 0 .  diclofenac sodium (VOLTAREN) 1 % GEL, Apply 2 g topically 4 (four) times daily as needed (for pain). , Disp: , Rfl:  .  dicyclomine (BENTYL) 10 MG capsule, Take 10 mg by mouth 3 (three) times daily before meals. , Disp: , Rfl:  .  fluticasone (FLONASE) 50  MCG/ACT nasal spray, Place 2 sprays into both nostrils daily., Disp: 16 g, Rfl: 11 .  furosemide (LASIX) 40 MG tablet, Take 40 mg by mouth 2 (two) times daily. , Disp: , Rfl:  .  gabapentin (NEURONTIN) 300 MG capsule, take 2 capsules (669m) by mouth three times a day, Disp: 180 capsule, Rfl: 1 .  hydrALAZINE (APRESOLINE) 100 MG tablet, TAKE 100 MG BY MOUTH THREE TIMES A DAY, Disp: 270 tablet, Rfl: 3 .  insulin lispro (HUMALOG) 100 UNIT/ML KiwkPen, Inject 14-32 Units into the skin 3 (three) times daily. (according to sliding scale - max 96u over 24 hours), Disp: , Rfl:  .  ipratropium (ATROVENT) 0.06 % nasal spray, Place 2 sprays into both nostrils 4 (four) times daily., Disp: 15 mL, Rfl: 12 .  isosorbide mononitrate (IMDUR) 120 MG 24 hr tablet, TAKE 1 TABLET BY MOUTH EVERY DAY, Disp: 90 tablet, Rfl: 1 .  levocetirizine (XYZAL) 5 MG tablet, Take 1 tablet (5 mg total) by mouth every evening., Disp: , Rfl:  .  montelukast (SINGULAIR) 10 MG tablet, Take 1 tablet (10 mg total) by mouth at bedtime., Disp: 90 tablet, Rfl: 3 .  Multiple Vitamin (MULTI-VITAMINS) TABS, Take 1 tablet by mouth daily., Disp: , Rfl:  .  nitroGLYCERIN (NITROSTAT) 0.4 MG SL tablet, Place 1 tablet (0.4 mg total) under the tongue every 5 (five) minutes x 3 doses as needed. For chest pain.  Call 911 if no relief after 3 tablets, Disp: 30 tablet, Rfl: 11 .  omega-3 acid ethyl esters (LOVAZA) 1 g capsule, Take 2 capsules (2 g total) by mouth 2 (two) times daily., Disp: 360 capsule, Rfl: 3 .  omeprazole (PRILOSEC) 40 MG capsule, Take 40 mg by mouth 2 (two) times daily. , Disp: , Rfl:  .   pravastatin (PRAVACHOL) 40 MG tablet, Take 1 tablet (40 mg total) by mouth daily., Disp: 90 tablet, Rfl: 3 .  probenecid (BENEMID) 500 MG tablet, Take 500 mg by mouth 2 (two) times daily. , Disp: , Rfl: 0 .  sodium chloride HYPERTONIC 3 % nebulizer solution, Take by nebulization 2 (two) times daily., Disp: 300 mL, Rfl: 11 .  spironolactone (ALDACTONE) 25 MG tablet, Take 12.5 mg by mouth daily. , Disp: , Rfl:  .  TRESIBA FLEXTOUCH 200 UNIT/ML SOPN, Inject 90 Units into the skin at bedtime., Disp: , Rfl: 11 .  vitamin C (ASCORBIC ACID) 500 MG tablet, Take 500 mg by mouth daily. , Disp: , Rfl:  .  Vitamin E 400 units TABS, Take 400 Units by mouth 2 (two) times daily., Disp: , Rfl:   EXAM:  VITALS per patient if applicable:  GENERAL: alert, oriented, appears well and in no acute distress  PSYCH/NEURO: pleasant and cooperative, no obvious depression or anxiety, speech and thought processing grossly intact  ASSESSMENT AND PLAN:  Discussed the following assessment and plan:  Daytime sleepiness likely mutifactorial not using bipap and declines to use, medications, dx of dementia  -will try to reduce elavil 50 mg to 25 mg as diarrhea improved will [facility]  Diarrhea, unspecified type - Plan: amitriptyline (ELAVIL) reduce from 50 mg to 25 mg qhs  [facility] making this change   Abnormal thyroid function test-[facility]  Leukocytosis, unspecified type-[facility] Systolic dysfunction see echo 09/16/18 [facility]   DM type 2 with diabetic peripheral neuropathy ([facility])A1C 7.3 [facility]  Will see if they can do CMET, CBC, TSH,, lipid, urine protein/microalbumin/Creatinine, vitamin D, A1C  -cont meds Tresiba 90 units and SSI per endocrine   Coronary artery disease involving native coronary artery of native heart without angina pectoris B/l carotid artery stenosis  -cont meds statin, aspirin, plavix and  f/u cards   PND (post-nasal drip)-rec use atrovent more often to see if helps can use tid to qid   Essential hypertension-cont meds controlled   HM Flu shot utd  pna 23 had 02/2014 UTD prevnar Disc Tdap and shingrix in future   PSAhad nl 10/03/17 0.5 consider DRE in future [facility]oscopy [facility] crohns on mesalamine 2.4 mg qd-negative colonoscopy with multiple bxs Consider repeat DEXA reviewed DEXA +osteoporosis noted 06/07/04 CT chest5/13/19 + lung nodules With h/o pulm nodules on CXR and CT chest10/11/16and former smoker -repeat CTA chest 06/13/18 neg pulmonary nodules  [place] dermatologist 2019tbse c/w left temple and right hand Aks. -H/o skin cancer   I discussed the assessment and treatment plan with the patient. The patient was provided an opportunity to ask questions and all were answered. The patient agreed with the plan and demonstrated an understanding of the instructions.   The patient was advised to call back or seek an in-person evaluation if the symptoms worsen or if the condition fails to improve as anticipated.  Time spent 20 minutes  Delorise Jackson, MD

## 2019-02-19 MED ORDER — AMITRIPTYLINE HCL 50 MG PO TABS: 25.0000 mg | ORAL_TABLET | Freq: Every evening | ORAL | Status: DC | PRN

## 2019-02-19 NOTE — Patient Instructions (Signed)

## 2019-02-20 ENCOUNTER — Other Ambulatory Visit: Payer: Self-pay | Admitting: Gastroenterology

## 2019-02-20 DIAGNOSIS — R935 Abnormal findings on diagnostic imaging of other abdominal regions, including retroperitoneum: Secondary | ICD-10-CM | POA: Diagnosis not present

## 2019-02-20 DIAGNOSIS — R1310 Dysphagia, unspecified: Secondary | ICD-10-CM

## 2019-02-20 DIAGNOSIS — K219 Gastro-esophageal reflux disease without esophagitis: Secondary | ICD-10-CM | POA: Diagnosis not present

## 2019-02-27 ENCOUNTER — Other Ambulatory Visit: Payer: Self-pay

## 2019-02-27 ENCOUNTER — Ambulatory Visit
Admission: RE | Admit: 2019-02-27 | Discharge: 2019-02-27 | Disposition: A | Discharge status: Home / Self Care | Payer: Medicare Other | Source: Ambulatory Visit | Attending: Gastroenterology | Admitting: Gastroenterology

## 2019-02-27 DIAGNOSIS — K219 Gastro-esophageal reflux disease without esophagitis: Secondary | ICD-10-CM | POA: Diagnosis not present

## 2019-02-27 DIAGNOSIS — R1310 Dysphagia, unspecified: Secondary | ICD-10-CM | POA: Diagnosis not present

## 2019-03-02 DIAGNOSIS — Z79899 Other long term (current) drug therapy: Secondary | ICD-10-CM | POA: Diagnosis not present

## 2019-03-02 DIAGNOSIS — M1A00X0 Idiopathic chronic gout, unspecified site, without tophus (tophi): Secondary | ICD-10-CM | POA: Diagnosis not present

## 2019-03-14 DIAGNOSIS — G4733 Obstructive sleep apnea (adult) (pediatric): Secondary | ICD-10-CM | POA: Diagnosis not present

## 2019-03-14 DIAGNOSIS — I10 Essential (primary) hypertension: Secondary | ICD-10-CM | POA: Diagnosis not present

## 2019-03-14 DIAGNOSIS — I480 Paroxysmal atrial fibrillation: Secondary | ICD-10-CM | POA: Diagnosis not present

## 2019-03-14 DIAGNOSIS — E1159 Type 2 diabetes mellitus with other circulatory complications: Secondary | ICD-10-CM | POA: Diagnosis not present

## 2019-03-14 DIAGNOSIS — I2510 Atherosclerotic heart disease of native coronary artery without angina pectoris: Secondary | ICD-10-CM | POA: Diagnosis not present

## 2019-03-27 ENCOUNTER — Other Ambulatory Visit: Payer: Self-pay | Admitting: Internal Medicine

## 2019-03-27 DIAGNOSIS — I10 Essential (primary) hypertension: Secondary | ICD-10-CM

## 2019-03-27 DIAGNOSIS — I2510 Atherosclerotic heart disease of native coronary artery without angina pectoris: Secondary | ICD-10-CM

## 2019-03-27 MED ORDER — CARVEDILOL 25 MG PO TABS: 25.0000 mg | ORAL_TABLET | Freq: Two times a day (BID) | ORAL | 3 refills | Status: AC

## 2019-03-28 ENCOUNTER — Other Ambulatory Visit: Payer: Self-pay | Admitting: Internal Medicine

## 2019-03-28 DIAGNOSIS — E785 Hyperlipidemia, unspecified: Secondary | ICD-10-CM

## 2019-03-28 MED ORDER — PRAVASTATIN SODIUM 40 MG PO TABS: 40.0000 mg | ORAL_TABLET | Freq: Every day | ORAL | 3 refills | Status: AC

## 2019-05-10 ENCOUNTER — Other Ambulatory Visit: Payer: Self-pay | Admitting: Internal Medicine

## 2019-05-10 DIAGNOSIS — J069 Acute upper respiratory infection, unspecified: Secondary | ICD-10-CM

## 2019-05-10 DIAGNOSIS — J029 Acute pharyngitis, unspecified: Secondary | ICD-10-CM

## 2019-05-10 MED ORDER — FLUTICASONE PROPIONATE 50 MCG/ACT NA SUSP: 2.0000 | Freq: Every day | NASAL | 11 refills | Status: AC

## 2019-06-11 ENCOUNTER — Other Ambulatory Visit: Payer: Self-pay

## 2019-06-11 ENCOUNTER — Encounter: Payer: Self-pay | Admitting: Pulmonary Disease

## 2019-06-11 ENCOUNTER — Ambulatory Visit (INDEPENDENT_AMBULATORY_CARE_PROVIDER_SITE_OTHER): Payer: Medicare Other | Admitting: Pulmonary Disease

## 2019-06-11 VITALS — BP 140/82 | HR 60 | Temp 98.4°F | Ht 68.0 in | Wt 197.0 lb

## 2019-06-11 DIAGNOSIS — R0602 Shortness of breath: Secondary | ICD-10-CM | POA: Diagnosis not present

## 2019-06-11 DIAGNOSIS — J328 Other chronic sinusitis: Secondary | ICD-10-CM

## 2019-06-11 DIAGNOSIS — J479 Bronchiectasis, uncomplicated: Secondary | ICD-10-CM

## 2019-06-11 DIAGNOSIS — Z23 Encounter for immunization: Secondary | ICD-10-CM

## 2019-06-11 MED ORDER — OLOPATADINE HCL 0.6 % NA SOLN: 2.0000 | Freq: Two times a day (BID) | NASAL | 6 refills | Status: DC

## 2019-06-11 NOTE — Progress Notes (Signed)
Subjective:    Patient ID: Chase Cabrera, male    DOB: 11-Jun-1946, 73 y.o.   MRN: [id number]  HPI This is a 73 year old remote former smoker (quit 1963) patient has been followed by Dr. Roselie Awkward and is switching to this practice as Dr. Lake Bells is now doing only critical care.  His issues are noted to be bronchiectasis, allergic rhinitis, chronic sinusitis and obstructive sleep apnea for which he declines use of CPAP.  The patient states that he feels "perfectly fine" would like to get a flu vaccine today.  He does not endorse any issues with the exception of chronic rhinorrhea and nasal congestion symptoms.  This seems to be the center of his attention as far as symptomatology today.  He states also that he gets sinusitis very frequently.  He has issues with chronic cough but states that these are baseline and not worsened no sputum production.  He has not had any fevers, chills or sweats.  He does have occasional shortness of breath with exertion but does not find this to be a limiting symptom for him.  He is still able to perform his activities of daily living.  He follows pulmonary toilet regimen.  I have reviewed his prior notes from Dr. Lake Bells and his APP.  He desires flu vaccine today.   Review of Systems  Constitutional: Negative.   HENT: Positive for congestion, postnasal drip, rhinorrhea and sinus pressure.   Eyes: Negative.   Respiratory: Positive for shortness of breath.   Cardiovascular: Negative.   Gastrointestinal: Negative.   Endocrine: Negative.   Genitourinary: Negative.   Musculoskeletal: Negative.   Skin: Negative.   Allergic/Immunologic: Negative.   Neurological: Negative.   Hematological: Negative.   Psychiatric/Behavioral: Negative.   All other systems reviewed and are negative.      Objective:   Physical Exam Vitals signs and nursing note reviewed.  Constitutional:      General: He is awake. He is not in acute distress.    Appearance: Normal  appearance. He is overweight.  HENT:     Head: Normocephalic and atraumatic.     Right Ear: External ear normal.     Left Ear: External ear normal.     Nose:     Comments: Nose/mouth/throat not examined due to masking requirements for COVID 19. Eyes:     General: No scleral icterus.    Conjunctiva/sclera: Conjunctivae normal.     Pupils: Pupils are equal, round, and reactive to light.  Neck:     Musculoskeletal: Neck supple.     Thyroid: No thyromegaly.     Vascular: No JVD.     Trachea: Trachea and phonation normal.  Cardiovascular:     Rate and Rhythm: Normal rate and regular rhythm.     Pulses: Normal pulses.     Heart sounds: Normal heart sounds.  Pulmonary:     Effort: Pulmonary effort is normal.     Breath sounds: Rales (Crackles at bases, faint) present.  Abdominal:     General: There is no distension.  Musculoskeletal: Normal range of motion.     Right lower leg: No edema.     Left lower leg: No edema.  Lymphadenopathy:     Cervical: No cervical adenopathy.     Left cervical: No posterior cervical adenopathy.  Skin:    General: Skin is warm and dry.  Neurological:     General: No focal deficit present.     Mental Status: He is alert and oriented to  person, place, and time.  Psychiatric:        Mood and Affect: Mood normal.        Behavior: Behavior normal. Behavior is cooperative.       Assessment & Plan:   1.  Bronchiectasis without exacerbation: Continue pulmonary toilet.  Continue hypertonic nasal saline.  2.  Shortness of breath: Will obtain PFTs.  We will see the patient in follow-up after those are obtained.  3.  Chronic nasal congestion and chronic sinusitis: Patient has requested referral to ENT, will refer to ENT.  In addition, will start a trial of olopatadine for his chronic nasal congestion symptoms.  4.  Need for flu vaccine: Patient received high-dose flu vaccine today.   This chart was dictated using voice recognition software/Dragon.   Despite best efforts to proofread, errors can occur which can change the meaning.  Any change was purely unintentional.

## 2019-06-11 NOTE — Patient Instructions (Signed)
1.  We will refer you to ear nose and throat  2.  You will have breathing test ordered  3.  We are giving you a new nasal spray to try 2 puffs twice a day.  4.  We will see you back in 4 to 6 weeks.

## 2019-06-14 DIAGNOSIS — R809 Proteinuria, unspecified: Secondary | ICD-10-CM | POA: Diagnosis not present

## 2019-06-14 DIAGNOSIS — R946 Abnormal results of thyroid function studies: Secondary | ICD-10-CM | POA: Diagnosis not present

## 2019-06-14 DIAGNOSIS — E1159 Type 2 diabetes mellitus with other circulatory complications: Secondary | ICD-10-CM | POA: Diagnosis not present

## 2019-06-14 DIAGNOSIS — Z794 Long term (current) use of insulin: Secondary | ICD-10-CM | POA: Diagnosis not present

## 2019-06-14 DIAGNOSIS — E1129 Type 2 diabetes mellitus with other diabetic kidney complication: Secondary | ICD-10-CM | POA: Diagnosis not present

## 2019-06-14 DIAGNOSIS — E559 Vitamin D deficiency, unspecified: Secondary | ICD-10-CM | POA: Diagnosis not present

## 2019-06-18 DIAGNOSIS — E1129 Type 2 diabetes mellitus with other diabetic kidney complication: Secondary | ICD-10-CM | POA: Diagnosis not present

## 2019-06-18 DIAGNOSIS — E781 Pure hyperglyceridemia: Secondary | ICD-10-CM | POA: Diagnosis not present

## 2019-06-18 DIAGNOSIS — N1831 Chronic kidney disease, stage 3a: Secondary | ICD-10-CM | POA: Diagnosis not present

## 2019-06-18 DIAGNOSIS — E559 Vitamin D deficiency, unspecified: Secondary | ICD-10-CM | POA: Diagnosis not present

## 2019-06-18 DIAGNOSIS — E1159 Type 2 diabetes mellitus with other circulatory complications: Secondary | ICD-10-CM | POA: Diagnosis not present

## 2019-06-18 DIAGNOSIS — R809 Proteinuria, unspecified: Secondary | ICD-10-CM | POA: Diagnosis not present

## 2019-06-18 DIAGNOSIS — Z794 Long term (current) use of insulin: Secondary | ICD-10-CM | POA: Diagnosis not present

## 2019-06-18 DIAGNOSIS — E1121 Type 2 diabetes mellitus with diabetic nephropathy: Secondary | ICD-10-CM | POA: Diagnosis not present

## 2019-06-24 ENCOUNTER — Other Ambulatory Visit: Payer: Self-pay

## 2019-06-24 ENCOUNTER — Emergency Department: Payer: Medicare Other

## 2019-06-24 ENCOUNTER — Emergency Department
Admission: EM | Admit: 2019-06-24 | Discharge: 2019-06-24 | Disposition: A | Discharge status: Home / Self Care | Payer: Medicare Other | Attending: Emergency Medicine | Admitting: Emergency Medicine

## 2019-06-24 DIAGNOSIS — Z7982 Long term (current) use of aspirin: Secondary | ICD-10-CM | POA: Insufficient documentation

## 2019-06-24 DIAGNOSIS — I2510 Atherosclerotic heart disease of native coronary artery without angina pectoris: Secondary | ICD-10-CM | POA: Diagnosis not present

## 2019-06-24 DIAGNOSIS — Z85828 Personal history of other malignant neoplasm of skin: Secondary | ICD-10-CM | POA: Insufficient documentation

## 2019-06-24 DIAGNOSIS — R0602 Shortness of breath: Secondary | ICD-10-CM | POA: Diagnosis not present

## 2019-06-24 DIAGNOSIS — I252 Old myocardial infarction: Secondary | ICD-10-CM | POA: Insufficient documentation

## 2019-06-24 DIAGNOSIS — Z79899 Other long term (current) drug therapy: Secondary | ICD-10-CM | POA: Insufficient documentation

## 2019-06-24 DIAGNOSIS — E1140 Type 2 diabetes mellitus with diabetic neuropathy, unspecified: Secondary | ICD-10-CM | POA: Insufficient documentation

## 2019-06-24 DIAGNOSIS — Z955 Presence of coronary angioplasty implant and graft: Secondary | ICD-10-CM | POA: Diagnosis not present

## 2019-06-24 DIAGNOSIS — I5023 Acute on chronic systolic (congestive) heart failure: Secondary | ICD-10-CM

## 2019-06-24 DIAGNOSIS — Z96642 Presence of left artificial hip joint: Secondary | ICD-10-CM | POA: Insufficient documentation

## 2019-06-24 DIAGNOSIS — Z96641 Presence of right artificial hip joint: Secondary | ICD-10-CM | POA: Insufficient documentation

## 2019-06-24 DIAGNOSIS — Z87891 Personal history of nicotine dependence: Secondary | ICD-10-CM | POA: Insufficient documentation

## 2019-06-24 DIAGNOSIS — Z951 Presence of aortocoronary bypass graft: Secondary | ICD-10-CM | POA: Diagnosis not present

## 2019-06-24 DIAGNOSIS — I110 Hypertensive heart disease with heart failure: Secondary | ICD-10-CM | POA: Insufficient documentation

## 2019-06-24 DIAGNOSIS — Z794 Long term (current) use of insulin: Secondary | ICD-10-CM | POA: Diagnosis not present

## 2019-06-24 LAB — BRAIN NATRIURETIC PEPTIDE: B Natriuretic Peptide: 266 pg/mL — ABNORMAL HIGH (ref 0.0–100.0)

## 2019-06-24 LAB — CBC WITH DIFFERENTIAL/PLATELET
Abs Immature Granulocytes: 0.02 10*3/uL (ref 0.00–0.07)
Basophils Absolute: 0 10*3/uL (ref 0.0–0.1)
Basophils Relative: 0 %
Eosinophils Absolute: 0.2 10*3/uL (ref 0.0–0.5)
Eosinophils Relative: 3 %
HCT: 36.8 % — ABNORMAL LOW (ref 39.0–52.0)
Hemoglobin: 12.6 g/dL — ABNORMAL LOW (ref 13.0–17.0)
Immature Granulocytes: 0 %
Lymphocytes Relative: 14 %
Lymphs Abs: 1.2 10*3/uL (ref 0.7–4.0)
MCH: 35.1 pg — ABNORMAL HIGH (ref 26.0–34.0)
MCHC: 34.2 g/dL (ref 30.0–36.0)
MCV: 102.5 fL — ABNORMAL HIGH (ref 80.0–100.0)
Monocytes Absolute: 1 10*3/uL (ref 0.1–1.0)
Monocytes Relative: 12 %
Neutro Abs: 5.8 10*3/uL (ref 1.7–7.7)
Neutrophils Relative %: 71 %
Platelets: 211 10*3/uL (ref 150–400)
RBC: 3.59 MIL/uL — ABNORMAL LOW (ref 4.22–5.81)
RDW: 12.9 % (ref 11.5–15.5)
WBC: 8.3 10*3/uL (ref 4.0–10.5)
nRBC: 0 % (ref 0.0–0.2)

## 2019-06-24 LAB — TROPONIN I (HIGH SENSITIVITY)
Troponin I (High Sensitivity): 13 ng/L (ref <18)
Troponin I (High Sensitivity): 12 ng/L (ref <18)

## 2019-06-24 LAB — PROTIME-INR
INR: 1.1 (ref 0.8–1.2)
Prothrombin Time: 14.2 seconds (ref 11.4–15.2)

## 2019-06-24 LAB — COMPREHENSIVE METABOLIC PANEL
ALT: 27 U/L (ref 0–44)
AST: 27 U/L (ref 15–41)
Albumin: 3.9 g/dL (ref 3.5–5.0)
Alkaline Phosphatase: 96 U/L (ref 38–126)
Anion gap: 14 (ref 5–15)
BUN: 19 mg/dL (ref 8–23)
CO2: 25 mmol/L (ref 22–32)
Calcium: 9.4 mg/dL (ref 8.9–10.3)
Chloride: 98 mmol/L (ref 98–111)
Creatinine, Ser: 1.04 mg/dL (ref 0.61–1.24)
GFR calc Af Amer: >60 mL/min (ref >60)
GFR calc non Af Amer: >60 mL/min (ref >60)
Glucose, Bld: 166 mg/dL — ABNORMAL HIGH (ref 70–99)
Potassium: 3.6 mmol/L (ref 3.5–5.1)
Sodium: 137 mmol/L (ref 135–145)
Total Bilirubin: 0.8 mg/dL (ref 0.3–1.2)
Total Protein: 8 g/dL (ref 6.5–8.1)

## 2019-06-24 MED ORDER — FUROSEMIDE 10 MG/ML IJ SOLN
80.0000 mg | Freq: Once | INTRAMUSCULAR | Status: AC
  Administered 2019-06-24: 80 mg via INTRAVENOUS
  Filled 2019-06-24: qty 8

## 2019-06-24 NOTE — ED Notes (Signed)
X-ray at bedside

## 2019-06-24 NOTE — ED Notes (Signed)
Pt given blanket.

## 2019-06-24 NOTE — ED Notes (Signed)
Pt states he got dizzy and felt like he was going to pass out from his breathing earlier today- pt has spells and laboured breathing

## 2019-06-24 NOTE — ED Notes (Signed)
Dr Charna Archer at bedside

## 2019-06-24 NOTE — ED Notes (Signed)
Pt resting in bed with eyes closed- wife at bedside

## 2019-06-24 NOTE — ED Notes (Signed)
Pt given cup of water, apple sauce, crackers and peanut butter

## 2019-06-24 NOTE — ED Triage Notes (Signed)
Pt reports around noon he started having sob, and panting to catch his breath, pt denies chest pain, pt called ems to the home who gave him the option of coming pov since they didn't find any reason to transport, pt is periodically panting to catch his breath, pt is bradycardic at 45 and pt reports typically 70-80

## 2019-06-24 NOTE — Discharge Instructions (Signed)
Your chest x-ray today showed a significant amount of fluid in your lungs related to your heart failure.  You were given a dose of fluid medicine here in the ER, but you should increase your dose of Lasix at home.  Please take 80 mg of Lasix in the morning and 40 mg of Lasix at night over the next 3 to 4 days and monitor your weight.  Please schedule follow-up in the heart failure clinic as soon as possible and return to the ER for new or worsening symptoms.

## 2019-06-24 NOTE — ED Provider Notes (Signed)
[facility] Emergency Department Provider Note   ____________________________________________   First MD Initiated Contact with Patient 06/24/19 1513     (approximate)  I have reviewed the triage vital signs and the nursing notes.   HISTORY  Chief Complaint Shortness of Breath    HPI Chase Cabrera is a 73 y.o. male with past medical history of CAD, CHF, hypertension, diabetes, atrial fibrillation, and dementia presents to the ED complaining of shortness of breath.  Patient reports he has had intermittent feelings of shortness of breath while at rest since about noon today.  He states it will come on all of a sudden with a feeling like he needs to breathe heavily and rapidly before it spontaneously improves.  He denies any associated chest pain, he deals with a chronic cough that is unchanged and denies any fevers.  He has not noticed any swelling in his legs, but wife states that when he retains fluid it seems to accumulate in his abdomen.  She does note he has had increased abdominal girth over the past few days.  He has been compliant with Lasix and making appropriate amount of urine.  He states his breathing feels normal currently.        Past Medical History:  Diagnosis Date  . Arthritis   . Atrial fibrillation ([place])   . CAD (coronary artery disease)    6 STENTS  . CHF (congestive heart failure) ([place])   . Chicken pox   . Colon polyp   . Corneal dystrophy    bilateral  . Crohn's disease ([place])   . Diabetes ([place]) 2002  . Dysrhythmia    A-FIB AND PAF  . GERD (gastroesophageal reflux disease)   . Gout   . History of kidney stones   . History of shingles   . Hyperlipemia   . Hypertension    2/2 b/l RAS >60% [place] 06/2018 s/p right stent   . Kidney stones   . Myocardial infarction ([place])    x4, last one in 2010  . Peripheral neuropathy   . Peripheral neuropathy   . Peripheral neuropathy   . PUD (peptic ulcer disease)   . Rectus sheath  hematoma    02/18/18 8.9 x 4.5 cm then 02/19/18 7.9 x 4.5 cm   . Renal artery stenosis ([place])   . Renal artery stenosis ([place])   . Salzmann's nodular dystrophy 2012  . Sleep apnea   . Spinal stenosis     Patient Active Problem List   Diagnosis Date Noted  . Dementia ([place]) 02/15/2019  . Systolic dysfunction 59/16/3846  . BPH (benign prostatic hyperplasia) 02/15/2019  . STEMI (ST elevation myocardial infarction) ([place]) 09/16/2018  . Acute ST elevation myocardial infarction (STEMI) of lateral wall ([place]) 09/16/2018  . Diarrhea 08/09/2018  . Allergic rhinitis 08/08/2018  . Bronchiectasis without complication ([place]) 65/99/3570  . TIA (transient ischemic attack) 04/30/2018  . Abnormal thyroid function test 04/03/2018  . Leukocytosis 04/03/2018  . Hematoma of rectus sheath 02/21/2018  . Carotid artery stenosis 02/16/2018  . Altered mental status 02/09/2018  . Hypokalemia 01/20/2018  . OSA (obstructive sleep apnea) 01/12/2018  . Osteoarthritis of right hip 12/13/2017  . Intraabdominal mass 11/23/2017  . Colonic mass 11/18/2017  . Crohn's disease ([place]) 10/20/2017  . Pulmonary nodule 10/07/2017  . History of skin cancer 10/07/2017  . Rib pain on right side 03/07/2017  . Postnasal drip 09/09/2016  . Osteoarthritis 04/08/2016  . Spinal stenosis, lumbar 03/25/2016  . DM type  2 with diabetic peripheral neuropathy ([place]) 07/15/2014  . Gout 05/04/2013  . Renal artery stenosis ([place]) 07/01/2011  . Atrial fibrillation ([place]) 06/30/2011  . CAD (coronary artery disease) 06/30/2011  . Hyperlipidemia 06/30/2011  . Essential hypertension 06/30/2011    Past Surgical History:  Procedure Laterality Date  . ABLATION  2012  . APPLICATION VERTERBRAL DEFECT PROSTHETIC  05/01/2013  . ARTHRODESIS ANTERIOR LUMBAR SPINE  05/01/2013  . BACK SURGERY     lumbar fusion  . CARDIAC CATHETERIZATION    . CARDIAC ELECTROPHYSIOLOGY STUDY AND ABLATION    . CARDIAC SURGERY    . CARDIOVERSION    . CHOLECYSTECTOMY     . COLONOSCOPY WITH PROPOFOL N/A 04/09/2016   Completed for chronic diarrhea.  Few scattered diverticuli.  No mucosal lesions.  Surgeon: Lollie Sails, MD;  Location: [facility] ENDOSCOPY;  Service: Endoscopy;  Laterality: N/A;  . CORNEAL EYE SURGERY Bilateral 07/28/11    09/22/2011  . CORONARY ANGIOPLASTY    . CORONARY ANGIOPLASTY WITH STENT PLACEMENT  03/28/2015   Distal 80% to normal Stent, Dilation Balloon  . CORONARY ARTERY BYPASS GRAFT     4 VESSELS  . CORONARY/GRAFT ACUTE MI REVASCULARIZATION N/A 09/16/2018   Procedure: Coronary/Graft Acute MI Revascularization;  Surgeon: Isaias Cowman, MD;  Location: [place] CV LAB;  Service: Cardiovascular;  Laterality: N/A;  . EYE SURGERY     bilateral cataract 2017 Dr. Megan Mans  . foot and ankle repair Right 2005  . FRACTURE SURGERY     ANKLE PLATE AND SCREWS  . GALLBLADER    . JOINT REPLACEMENT     left total hip 11/11/15  . JOINT REPLACEMENT     right total hip 12/2017 Dr. Rudene Christians   . KNEE ARTHROSCOPY Right   . LEFT HEART CATH AND CORONARY ANGIOGRAPHY N/A 09/16/2018   Procedure: LEFT HEART CATH AND CORONARY ANGIOGRAPHY;  Surgeon: Isaias Cowman, MD;  Location: [place] CV LAB;  Service: Cardiovascular;  Laterality: N/A;  . LUMBAR SPINE FUSION ONE LEVEL  05/03/2013  . RENAL ARTERY STENT Right   . ROTATOR CUFF REPAIR Right 2006  . TONSILLECTOMY    . TOTAL HIP ARTHROPLASTY Left 11/11/2015   Procedure: TOTAL HIP ARTHROPLASTY ANTERIOR APPROACH;  Surgeon: Hessie Knows, MD;  Location: [facility] ORS;  Service: Orthopedics;  Laterality: Left;  . TOTAL HIP ARTHROPLASTY Right 12/13/2017   Procedure: TOTAL HIP ARTHROPLASTY ANTERIOR APPROACH;  Surgeon: Hessie Knows, MD;  Location: [facility] ORS;  Service: Orthopedics;  Laterality: Right;  . TRANSCATH PLACEMENT INTRAVASCULAR STENT LEG  03/2015    Prior to Admission medications   Medication Sig Start Date End Date Taking? Authorizing Provider  amitriptyline (ELAVIL) 50 MG tablet Take 0.5 tablets (25  mg total) by mouth at bedtime as needed. 02/19/19   McLean-Scocuzza, Nino Glow, MD  amLODipine (NORVASC) 5 MG tablet Take 1 tablet (5 mg total) by mouth 2 (two) times daily. If blood pressure >140/>90 make take another 1 pill of 5 mg 05/01/18   Mayo, Pete Pelt, MD  aspirin EC 81 MG EC tablet Take 1 tablet (81 mg total) by mouth daily. 05/02/18   Mayo, Pete Pelt, MD  carvedilol (COREG) 25 MG tablet Take 1 tablet (25 mg total) by mouth 2 (two) times daily with a meal. 03/27/19   McLean-Scocuzza, Nino Glow, MD  Cholecalciferol 50000 units TABS Take 1 tablet by mouth once a week. 10/07/17   McLean-Scocuzza, Nino Glow, MD  cloNIDine (CATAPRES) 0.2 MG tablet Take by mouth 2 (two) times daily.  06/09/18   [provider]  clopidogrel (PLAVIX) 75 MG tablet Take 75 mg by mouth daily.    [provider]  colchicine 0.6 MG tablet TAKE 1 TABLET BY MOUTH ONCE DAILY 07/12/17   Leone Haven, MD  diclofenac sodium (VOLTAREN) 1 % GEL Apply 2 g topically 4 (four) times daily as needed (for pain).     [provider]  dicyclomine (BENTYL) 10 MG capsule Take 10 mg by mouth 3 (three) times daily before meals.     [provider]  fluticasone (FLONASE) 50 MCG/ACT nasal spray Place 2 sprays into both nostrils daily. 05/10/19   McLean-Scocuzza, Nino Glow, MD  furosemide (LASIX) 40 MG tablet Take 40 mg by mouth 2 (two) times daily.  09/22/18 09/22/19  [provider]  gabapentin (NEURONTIN) 300 MG capsule take 2 capsules (64m) by mouth three times a day 01/16/18   McLean-Scocuzza, TNino Glow MD  hydrALAZINE (APRESOLINE) 100 MG tablet TAKE 100 MG BY MOUTH THREE TIMES A DAY 01/02/19   McLean-Scocuzza, TNino Glow MD  insulin lispro (HUMALOG) 100 UNIT/ML KiwkPen Inject 14-32 Units into the skin 3 (three) times daily. (according to sliding scale - max 96u over 24 hours)    [provider]  ipratropium (ATROVENT) 0.06 % nasal spray Place 2 sprays into both nostrils 4 (four) times daily. 02/09/18    McLean-Scocuzza, TNino Glow MD  isosorbide mononitrate (IMDUR) 120 MG 24 hr tablet TAKE 1 TABLET BY MOUTH EVERY DAY 10/07/17   McLean-Scocuzza, TNino Glow MD  levocetirizine (XYZAL) 5 MG tablet Take 1 tablet (5 mg total) by mouth every evening. 02/09/18   McLean-Scocuzza, TNino Glow MD  montelukast (SINGULAIR) 10 MG tablet Take 1 tablet (10 mg total) by mouth at bedtime. 05/09/18   McLean-Scocuzza, TNino Glow MD  Multiple Vitamin (MULTI-VITAMINS) TABS Take 1 tablet by mouth daily. 04/15/09   [provider]  nitroGLYCERIN (NITROSTAT) 0.4 MG SL tablet Place 1 tablet (0.4 mg total) under the tongue every 5 (five) minutes x 3 doses as needed. For chest pain.  Call 911 if no relief after 3 tablets 10/03/17 11/02/21  McLean-Scocuzza, TNino Glow MD  Olopatadine HCl 0.6 % SOLN Place 2 sprays into the nose 2 (two) times daily. 06/11/19   GTyler Pita MD  omega-3 acid ethyl esters (LOVAZA) 1 g capsule Take 2 capsules (2 g total) by mouth 2 (two) times daily. 05/12/18   McLean-Scocuzza, TNino Glow MD  omeprazole (PRILOSEC) 40 MG capsule Take 40 mg by mouth 2 (two) times daily.     [provider]  pravastatin (PRAVACHOL) 40 MG tablet Take 1 tablet (40 mg total) by mouth daily. 03/28/19   McLean-Scocuzza, TNino Glow MD  probenecid (BENEMID) 500 MG tablet Take 500 mg by mouth 2 (two) times daily.  01/09/15   [provider]  sodium chloride HYPERTONIC 3 % nebulizer solution Take by nebulization 2 (two) times daily. 06/14/18   MJuanito Doom MD  spironolactone (ALDACTONE) 25 MG tablet Take 12.5 mg by mouth daily.  09/23/18 09/23/19  [provider]  TRESIBA FLEXTOUCH 200 UNIT/ML SOPN Inject 90 Units into the skin at bedtime. 04/25/18   [provider]  vitamin C (ASCORBIC ACID) 500 MG tablet Take 500 mg by mouth daily.     [provider]  Vitamin E 400 units TABS Take 400 Units by mouth 2 (two) times daily.    [provider]    Allergies Allopurinol, Atenolol,  Atorvastatin, Ramipril, Rosuvastatin, Simvastatin,  Valsartan, and Cardizem [diltiazem]  Family History  Problem Relation Age of Onset  . CVA Father   . Stroke Father   . Lung cancer Mother   . Arthritis Mother   . Stomach cancer Sister   . Colon cancer Sister   . Diabetes Brother     Social History Social History   Tobacco Use  . Smoking status: Former Smoker    Packs/day: 1.00    Years: 3.00    Pack years: 3.00    Types: Cigarettes    Quit date: 06/30/1962    Years since quitting: 57.0  . Smokeless tobacco: Former Systems developer    Types: Chew    Quit date: 12/05/1968  Substance Use Topics  . Alcohol use: No  . Drug use: No    Review of Systems  Constitutional: No fever/chills Eyes: No visual changes. ENT: No sore throat. Cardiovascular: Denies chest pain. Respiratory: Positive for shortness of breath. Gastrointestinal: No abdominal pain.  No nausea, no vomiting.  No diarrhea.  No constipation. Genitourinary: Negative for dysuria. Musculoskeletal: Negative for back pain. Skin: Negative for rash. Neurological: Negative for headaches, focal weakness or numbness.  ____________________________________________   PHYSICAL EXAM:  VITAL SIGNS: ED Triage Vitals [06/24/19 1422]  Enc Vitals Group     BP (!) 129/52     Pulse Rate (!) 47     Resp (!) 22     Temp 97.8 F (36.6 C)     Temp Source Oral     SpO2 98 %     Weight 198 lb (89.8 kg)     Height 5' 8"  (1.727 m)     Head Circumference      Peak Flow      Pain Score 0     Pain Loc      Pain Edu?      Excl. in [place]?     Constitutional: Alert and oriented. Eyes: Conjunctivae are normal. Head: Atraumatic. Nose: No congestion/rhinnorhea. Mouth/Throat: Mucous membranes are moist. Neck: Normal ROM Cardiovascular: Normal rate, regular rhythm. Grossly normal heart sounds. Respiratory: Normal respiratory effort.  No retractions. Lungs with crackles to bilateral bases. Gastrointestinal: Soft and nontender. No  distention. Genitourinary: deferred Musculoskeletal: No lower extremity tenderness.  Trace pitting edema to bilateral lower extremities. Neurologic:  Normal speech and language. No gross focal neurologic deficits are appreciated. Skin:  Skin is warm, dry and intact. No rash noted. Psychiatric: Mood and affect are normal. Speech and behavior are normal.  ____________________________________________   LABS (all labs ordered are listed, but only abnormal results are displayed)  Labs Reviewed  CBC WITH DIFFERENTIAL/PLATELET - Abnormal; Notable for the following components:      Result Value   RBC 3.59 (*)    Hemoglobin 12.6 (*)    HCT 36.8 (*)    MCV 102.5 (*)    MCH 35.1 (*)    All other components within normal limits  COMPREHENSIVE METABOLIC PANEL - Abnormal; Notable for the following components:   Glucose, Bld 166 (*)    All other components within normal limits  BRAIN NATRIURETIC PEPTIDE - Abnormal; Notable for the following components:   B Natriuretic Peptide 266.0 (*)    All other components within normal limits  PROTIME-INR  TROPONIN I (HIGH SENSITIVITY)  TROPONIN I (HIGH SENSITIVITY)   ____________________________________________  EKG  ED ECG REPORT I, Blake Divine, the attending physician, personally viewed and interpreted this ECG.   Date: 06/24/2019  EKG Time: 14:21  Rate: 49  Rhythm: sinus bradycardia  Axis: LAD  Intervals:none  ST&T Change: None   PROCEDURES  Procedure(s) performed (including Critical Care):  Procedures   ____________________________________________   INITIAL IMPRESSION / ASSESSMENT AND PLAN / ED COURSE       73 year old male with history of CAD, CHF, hypertension, diabetes, and A. fib presents to the ED complaining of intermittent episodes of shortness of breath since noon today.  He denies any associated chest pain and EKG shows no ischemic changes, doubt ACS.  Will screen 2 sets of troponin.  Lower suspicion for infectious  etiology as he has unchanged chronic cough with no fever.  Will check chest x-ray for evidence of CHF exacerbation versus pneumonia.  He does appear fluid overloaded, depending on results of chest x-ray and BNP, suspect he would benefit from some diuresis here in the ED.  Given he is in no respiratory distress and states breathing is back to normal currently, he would likely be appropriate for close outpatient follow-up.  Chest x-ray consistent with pulmonary edema.  Patient was given IV dose of Lasix here with subsequent diuresis.  He continues to not be in any respiratory distress with no oxygen requirement.  Counseled patient to increase his Lasix dose for the next few days and follow-up closely in the heart failure clinic.  Counseled to return to the ED for new or worsening symptoms, patient and wife agree with plan.      ____________________________________________   FINAL CLINICAL IMPRESSION(S) / ED DIAGNOSES  Final diagnoses:  Acute on chronic systolic congestive heart failure (HCC)  Coronary artery disease involving native coronary artery of native heart without angina pectoris     ED Discharge Orders         Ordered    AMB referral to CHF clinic     06/24/19 1815           Note:  This document was prepared using Dragon voice recognition software and may include unintentional dictation errors.   Blake Divine, MD 06/25/19 0030

## 2019-06-25 DIAGNOSIS — R0600 Dyspnea, unspecified: Secondary | ICD-10-CM | POA: Diagnosis not present

## 2019-06-25 DIAGNOSIS — I480 Paroxysmal atrial fibrillation: Secondary | ICD-10-CM | POA: Diagnosis not present

## 2019-06-25 DIAGNOSIS — I5033 Acute on chronic diastolic (congestive) heart failure: Secondary | ICD-10-CM | POA: Diagnosis not present

## 2019-06-25 DIAGNOSIS — R918 Other nonspecific abnormal finding of lung field: Secondary | ICD-10-CM | POA: Diagnosis not present

## 2019-06-25 DIAGNOSIS — M1990 Unspecified osteoarthritis, unspecified site: Secondary | ICD-10-CM | POA: Diagnosis not present

## 2019-06-25 DIAGNOSIS — I2510 Atherosclerotic heart disease of native coronary artery without angina pectoris: Secondary | ICD-10-CM | POA: Diagnosis not present

## 2019-06-25 DIAGNOSIS — R0602 Shortness of breath: Secondary | ICD-10-CM | POA: Diagnosis not present

## 2019-06-25 DIAGNOSIS — Z87891 Personal history of nicotine dependence: Secondary | ICD-10-CM | POA: Diagnosis not present

## 2019-06-25 DIAGNOSIS — Z20828 Contact with and (suspected) exposure to other viral communicable diseases: Secondary | ICD-10-CM | POA: Diagnosis not present

## 2019-06-25 DIAGNOSIS — I130 Hypertensive heart and chronic kidney disease with heart failure and stage 1 through stage 4 chronic kidney disease, or unspecified chronic kidney disease: Secondary | ICD-10-CM | POA: Diagnosis not present

## 2019-06-26 DIAGNOSIS — J45909 Unspecified asthma, uncomplicated: Secondary | ICD-10-CM | POA: Diagnosis present

## 2019-06-26 DIAGNOSIS — I701 Atherosclerosis of renal artery: Secondary | ICD-10-CM | POA: Diagnosis present

## 2019-06-26 DIAGNOSIS — R0600 Dyspnea, unspecified: Secondary | ICD-10-CM | POA: Diagnosis not present

## 2019-06-26 DIAGNOSIS — E7800 Pure hypercholesterolemia, unspecified: Secondary | ICD-10-CM | POA: Diagnosis not present

## 2019-06-26 DIAGNOSIS — I130 Hypertensive heart and chronic kidney disease with heart failure and stage 1 through stage 4 chronic kidney disease, or unspecified chronic kidney disease: Secondary | ICD-10-CM | POA: Diagnosis present

## 2019-06-26 DIAGNOSIS — K589 Irritable bowel syndrome without diarrhea: Secondary | ICD-10-CM | POA: Diagnosis present

## 2019-06-26 DIAGNOSIS — M109 Gout, unspecified: Secondary | ICD-10-CM | POA: Diagnosis present

## 2019-06-26 DIAGNOSIS — N182 Chronic kidney disease, stage 2 (mild): Secondary | ICD-10-CM | POA: Diagnosis present

## 2019-06-26 DIAGNOSIS — Z79899 Other long term (current) drug therapy: Secondary | ICD-10-CM | POA: Diagnosis not present

## 2019-06-26 DIAGNOSIS — G4730 Sleep apnea, unspecified: Secondary | ICD-10-CM | POA: Diagnosis present

## 2019-06-26 DIAGNOSIS — Z7982 Long term (current) use of aspirin: Secondary | ICD-10-CM | POA: Diagnosis not present

## 2019-06-26 DIAGNOSIS — I480 Paroxysmal atrial fibrillation: Secondary | ICD-10-CM | POA: Diagnosis present

## 2019-06-26 DIAGNOSIS — E785 Hyperlipidemia, unspecified: Secondary | ICD-10-CM | POA: Diagnosis present

## 2019-06-26 DIAGNOSIS — Z87891 Personal history of nicotine dependence: Secondary | ICD-10-CM | POA: Diagnosis not present

## 2019-06-26 DIAGNOSIS — I5033 Acute on chronic diastolic (congestive) heart failure: Secondary | ICD-10-CM | POA: Diagnosis present

## 2019-06-26 DIAGNOSIS — M1990 Unspecified osteoarthritis, unspecified site: Secondary | ICD-10-CM | POA: Diagnosis present

## 2019-06-26 DIAGNOSIS — Z20828 Contact with and (suspected) exposure to other viral communicable diseases: Secondary | ICD-10-CM | POA: Diagnosis present

## 2019-06-26 DIAGNOSIS — K219 Gastro-esophageal reflux disease without esophagitis: Secondary | ICD-10-CM | POA: Diagnosis present

## 2019-06-26 DIAGNOSIS — Z794 Long term (current) use of insulin: Secondary | ICD-10-CM | POA: Diagnosis not present

## 2019-06-26 DIAGNOSIS — Z955 Presence of coronary angioplasty implant and graft: Secondary | ICD-10-CM | POA: Diagnosis not present

## 2019-06-26 DIAGNOSIS — I10 Essential (primary) hypertension: Secondary | ICD-10-CM | POA: Diagnosis not present

## 2019-06-26 DIAGNOSIS — Z951 Presence of aortocoronary bypass graft: Secondary | ICD-10-CM | POA: Diagnosis not present

## 2019-06-26 DIAGNOSIS — E1140 Type 2 diabetes mellitus with diabetic neuropathy, unspecified: Secondary | ICD-10-CM | POA: Diagnosis not present

## 2019-06-26 DIAGNOSIS — M810 Age-related osteoporosis without current pathological fracture: Secondary | ICD-10-CM | POA: Diagnosis present

## 2019-06-26 DIAGNOSIS — Z7902 Long term (current) use of antithrombotics/antiplatelets: Secondary | ICD-10-CM | POA: Diagnosis not present

## 2019-06-26 DIAGNOSIS — E1122 Type 2 diabetes mellitus with diabetic chronic kidney disease: Secondary | ICD-10-CM | POA: Diagnosis present

## 2019-06-26 DIAGNOSIS — R0602 Shortness of breath: Secondary | ICD-10-CM | POA: Diagnosis not present

## 2019-06-26 DIAGNOSIS — I2510 Atherosclerotic heart disease of native coronary artery without angina pectoris: Secondary | ICD-10-CM | POA: Diagnosis present

## 2019-06-26 DIAGNOSIS — E1142 Type 2 diabetes mellitus with diabetic polyneuropathy: Secondary | ICD-10-CM | POA: Diagnosis present

## 2019-06-26 DIAGNOSIS — Z7951 Long term (current) use of inhaled steroids: Secondary | ICD-10-CM | POA: Diagnosis not present

## 2019-06-26 DIAGNOSIS — Z9861 Coronary angioplasty status: Secondary | ICD-10-CM | POA: Diagnosis not present

## 2019-06-27 ENCOUNTER — Ambulatory Visit: Payer: Medicare Other | Admitting: Family

## 2019-06-28 ENCOUNTER — Telehealth: Payer: Self-pay

## 2019-06-28 MED ORDER — INSULIN GLARGINE 100 UNIT/ML ~~LOC~~ SOLN
0.40 | SUBCUTANEOUS | Status: DC
Start: 2019-06-27 — End: 2019-06-28

## 2019-06-28 MED ORDER — PRAVASTATIN SODIUM 20 MG PO TABS
40.00 | ORAL_TABLET | ORAL | Status: DC
Start: 2019-06-28 — End: 2019-06-28

## 2019-06-28 MED ORDER — CARVEDILOL 25 MG PO TABS
25.00 | ORAL_TABLET | ORAL | Status: DC
Start: 2019-06-28 — End: 2019-06-28

## 2019-06-28 MED ORDER — GLUCAGON HCL RDNA (DIAGNOSTIC) 1 MG IJ SOLR
1.00 | INTRAMUSCULAR | Status: DC
Start: ? — End: 2019-06-28

## 2019-06-28 MED ORDER — INSULIN LISPRO 100 UNIT/ML ~~LOC~~ SOLN
14.00 | SUBCUTANEOUS | Status: DC
Start: 2019-06-28 — End: 2019-06-28

## 2019-06-28 MED ORDER — PROBENECID 500 MG PO TABS
500.00 | ORAL_TABLET | ORAL | Status: DC
Start: 2019-06-27 — End: 2019-06-28

## 2019-06-28 MED ORDER — DICYCLOMINE HCL 10 MG PO CAPS
10.00 | ORAL_CAPSULE | ORAL | Status: DC
Start: 2019-06-27 — End: 2019-06-28

## 2019-06-28 MED ORDER — SPIRONOLACTONE 25 MG PO TABS
12.50 | ORAL_TABLET | ORAL | Status: DC
Start: 2019-06-28 — End: 2019-06-28

## 2019-06-28 MED ORDER — CLONIDINE HCL 0.1 MG PO TABS
0.20 | ORAL_TABLET | ORAL | Status: DC
Start: 2019-06-28 — End: 2019-06-28

## 2019-06-28 MED ORDER — COLCHICINE 0.6 MG PO TABS
0.60 | ORAL_TABLET | ORAL | Status: DC
Start: 2019-06-28 — End: 2019-06-28

## 2019-06-28 MED ORDER — LIDOCAINE HCL 1 % IJ SOLN
0.50 | INTRAMUSCULAR | Status: DC
Start: ? — End: 2019-06-28

## 2019-06-28 MED ORDER — OMEGA-3 1000 MG PO CAPS
1.00 | ORAL_CAPSULE | ORAL | Status: DC
Start: 2019-06-28 — End: 2019-06-28

## 2019-06-28 MED ORDER — HYDRALAZINE HCL 50 MG PO TABS
100.00 | ORAL_TABLET | ORAL | Status: DC
Start: 2019-06-27 — End: 2019-06-28

## 2019-06-28 MED ORDER — FENOFIBRATE 145 MG PO TABS
145.00 | ORAL_TABLET | ORAL | Status: DC
Start: 2019-06-28 — End: 2019-06-28

## 2019-06-28 MED ORDER — ASPIRIN EC 81 MG PO TBEC
81.00 | DELAYED_RELEASE_TABLET | ORAL | Status: DC
Start: 2019-06-28 — End: 2019-06-28

## 2019-06-28 MED ORDER — HEPARIN SODIUM (PORCINE) 5000 UNIT/ML IJ SOLN
5000.00 | INTRAMUSCULAR | Status: DC
Start: 2019-06-28 — End: 2019-06-28

## 2019-06-28 MED ORDER — AMITRIPTYLINE HCL 25 MG PO TABS
50.00 | ORAL_TABLET | ORAL | Status: DC
Start: 2019-06-27 — End: 2019-06-28

## 2019-06-28 MED ORDER — MULTI-VITAMIN PO TABS
1.00 | ORAL_TABLET | ORAL | Status: DC
Start: 2019-06-28 — End: 2019-06-28

## 2019-06-28 MED ORDER — CLOPIDOGREL BISULFATE 75 MG PO TABS
75.00 | ORAL_TABLET | ORAL | Status: DC
Start: 2019-06-28 — End: 2019-06-28

## 2019-06-28 MED ORDER — PROBENECID 500 MG PO TABS
1000.00 | ORAL_TABLET | ORAL | Status: DC
Start: 2019-06-28 — End: 2019-06-28

## 2019-06-28 MED ORDER — GENERIC EXTERNAL MEDICATION
40.00 | Status: DC
Start: 2019-06-28 — End: 2019-06-28

## 2019-06-28 MED ORDER — DEXTROSE 50 % IV SOLN
12.50 | INTRAVENOUS | Status: DC
Start: ? — End: 2019-06-28

## 2019-06-28 MED ORDER — MONTELUKAST SODIUM 10 MG PO TABS
10.00 | ORAL_TABLET | ORAL | Status: DC
Start: 2019-06-27 — End: 2019-06-28

## 2019-06-28 MED ORDER — ISOSORBIDE MONONITRATE ER 60 MG PO TB24
120.00 | ORAL_TABLET | ORAL | Status: DC
Start: 2019-06-28 — End: 2019-06-28

## 2019-06-28 MED ORDER — GABAPENTIN 300 MG PO CAPS
600.00 | ORAL_CAPSULE | ORAL | Status: DC
Start: 2019-06-27 — End: 2019-06-28

## 2019-06-28 MED ORDER — INSULIN LISPRO 100 UNIT/ML ~~LOC~~ SOLN
0.00 | SUBCUTANEOUS | Status: DC
Start: 2019-06-28 — End: 2019-06-28

## 2019-06-28 MED ORDER — AMLODIPINE BESYLATE 5 MG PO TABS
5.00 | ORAL_TABLET | ORAL | Status: DC
Start: 2019-06-28 — End: 2019-06-28

## 2019-06-28 MED ORDER — GENERIC EXTERNAL MEDICATION
1000.00 | Status: DC
Start: 2019-06-28 — End: 2019-06-28

## 2019-06-28 MED ORDER — DICLOFENAC EPOLAMINE 1.3 % TD PTCH
1.00 | MEDICATED_PATCH | TRANSDERMAL | Status: DC
Start: 2019-06-28 — End: 2019-06-28

## 2019-06-28 NOTE — Telephone Encounter (Signed)
Transition Care Management Follow-up Telephone Call   Date discharged? 06/27/19 from [facility] received from wife, [name] (HIPAA compliant).   How have you been since you were released from the hospital? Resting well. Appetite is good. Denies chest pain, dizziness, difficulty breathing. Using nebulizer BID.    Do you understand why you were in the hospital? Yes   Do you understand the discharge instructions? Yes, increase activity slowly, follow up with pcp and cardiology.  Use nebulizer every 6 hours prn.    Where were you discharged to? Home.    Items Reviewed:  Medications reviewed: Yes, taking scheduled medications as directed. Refuses to stop taking Vitamin E, per patient preference, due to history of leg restlessness at night.  Allergies reviewed: Yes, none new.   Dietary changes reviewed: Yes, low sodium  Referrals reviewed: Yes, cardiology scheduled 07/03/19.   Functional Questionnaire:   Activities of Daily Living (ADLs):   He states they are independent in the following: Independent in bathing, dressing, feeding, grooming. States they require assistance with the following: Meal prep (granddaughter assist), ambulating (walker/electirc scooter in use).   Any transportation issues/concerns?: None at this time. Patient is able to drive. Wife to accompany at visit.   Any patient concerns? Blood pressure only to be taken in R arm due to history of lump in L arm.   Confirmed importance and date/time of follow-up visits scheduled Yes, previously scheduled 07/05/19 @ 11:00.  Provider Appointment booked with Dr. Olivia Mackie McLean-Scocuzza, pcp.  Confirmed with patient if condition begins to worsen call PCP or go to the ER.  Patient was given the office number and encouraged to call back with question or concerns.  : Yes.

## 2019-07-03 ENCOUNTER — Ambulatory Visit: Payer: Medicare Other

## 2019-07-03 ENCOUNTER — Other Ambulatory Visit: Payer: Self-pay | Admitting: Internal Medicine

## 2019-07-03 ENCOUNTER — Other Ambulatory Visit: Payer: Self-pay

## 2019-07-03 DIAGNOSIS — J309 Allergic rhinitis, unspecified: Secondary | ICD-10-CM

## 2019-07-03 DIAGNOSIS — G4739 Other sleep apnea: Secondary | ICD-10-CM | POA: Diagnosis not present

## 2019-07-03 DIAGNOSIS — I480 Paroxysmal atrial fibrillation: Secondary | ICD-10-CM | POA: Diagnosis not present

## 2019-07-03 DIAGNOSIS — E7800 Pure hypercholesterolemia, unspecified: Secondary | ICD-10-CM | POA: Diagnosis not present

## 2019-07-03 DIAGNOSIS — I5033 Acute on chronic diastolic (congestive) heart failure: Secondary | ICD-10-CM | POA: Diagnosis not present

## 2019-07-03 DIAGNOSIS — I2510 Atherosclerotic heart disease of native coronary artery without angina pectoris: Secondary | ICD-10-CM | POA: Diagnosis not present

## 2019-07-03 DIAGNOSIS — I10 Essential (primary) hypertension: Secondary | ICD-10-CM | POA: Diagnosis not present

## 2019-07-03 MED ORDER — MONTELUKAST SODIUM 10 MG PO TABS: 10.0000 mg | ORAL_TABLET | Freq: Every day | ORAL | 3 refills | Status: AC

## 2019-07-04 ENCOUNTER — Ambulatory Visit (INDEPENDENT_AMBULATORY_CARE_PROVIDER_SITE_OTHER): Payer: Medicare Other

## 2019-07-04 DIAGNOSIS — Z0000 Encounter for general adult medical examination without abnormal findings: Secondary | ICD-10-CM | POA: Diagnosis not present

## 2019-07-04 NOTE — Progress Notes (Signed)
Subjective:   Chase Cabrera is a 73 y.o. male who presents for Medicare Annual/Subsequent preventive examination.  Review of Systems:  No ROS.  Medicare Wellness Virtual Visit.  Visual/audio telehealth visit, UTA vital signs.   See social history for additional risk factors.   Cardiac Risk Factors include: advanced age (>76mn, >>55women);male gender;hypertension;diabetes mellitus     Objective:    Vitals: There were no vitals taken for this visit.  There is no height or weight on file to calculate BMI.  Advanced Directives 07/04/2019 09/16/2018 06/13/2018 04/30/2018 04/30/2018 04/14/2018 02/19/2018  Does Patient Have a Medical Advance Directive? Yes Yes No Yes No Yes Yes  Type of AParamedicof [place] will Healthcare Power of [place] will - [place] will -  Does patient want to make changes to medical advance directive? No - Patient declined No - Patient declined - No - Patient declined - No - Patient declined -  Copy of [place] Chart? Yes - validated most recent copy scanned in chart (See row information) - - No - copy requested - Yes -  Would patient like information on creating a medical advance directive? - - No - Patient declined No - Patient declined No - Patient declined - -    Tobacco Social History   Tobacco Use  Smoking Status Former Smoker   Packs/day: 1.00   Years: 3.00   Pack years: 3.00   Types: Cigarettes   Quit date: 06/30/1962   Years since quitting: 57.0  Smokeless Tobacco Former User   Types: Chew   Quit date: 12/05/1968     Counseling given: Not Answered   Clinical Intake:  Pre-visit preparation completed: Yes        Diabetes: Yes(Followed by Endocrinology, Dr. MLavone Orn  How often do you need to have someone help you when you read instructions, pamphlets, or other written materials from your doctor or pharmacy?: 3  - Sometimes        Past Medical History:  Diagnosis Date   Arthritis    Atrial fibrillation ([facility]    CAD (coronary artery disease)    6 STENTS   CHF (congestive heart failure) ([facility]    Chicken pox    Colon polyp    Corneal dystrophy    bilateral   Crohn's disease ([facility]    Diabetes ([facility] 2002   Dysrhythmia    A-FIB AND PAF   GERD (gastroesophageal reflux disease)    Gout    History of kidney stones    History of shingles    Hyperlipemia    Hypertension    2/2 b/l RAS >60% [place] s/p right stent    Kidney stones    Myocardial infarction ([facility]    x4, last one in 2010   Peripheral neuropathy    Peripheral neuropathy    Peripheral neuropathy    PUD (peptic ulcer disease)    Rectus sheath hematoma    02/18/18 8.9 x 4.5 cm then 02/19/18 7.9 x 4.5 cm    Renal artery stenosis (HCC)    Renal artery stenosis ([facility] [place]    Salzmann's nodular dystrophy 2012   Sleep apnea    Spinal stenosis    Past Surgical History:  Procedure Laterality Date   ABLATION  21610  APPLICATION VERTERBRAL DEFECT PROSTHETIC  05/01/2013   ARTHRODESIS ANTERIOR LUMBAR SPINE  05/01/2013   BACK SURGERY     lumbar fusion  CARDIAC CATHETERIZATION     CARDIAC ELECTROPHYSIOLOGY STUDY AND ABLATION     CARDIAC SURGERY     CARDIOVERSION     CHOLECYSTECTOMY     COLONOSCOPY WITH PROPOFOL N/A 04/09/2016   Completed for chronic diarrhea.  Few scattered diverticuli.  No mucosal lesions.  Surgeon: Lollie Sails, MD;  Location: [facility];  Service: Endoscopy;  Laterality: N/A;   CORNEAL EYE SURGERY Bilateral 07/28/11    09/22/2011   CORONARY ANGIOPLASTY     CORONARY ANGIOPLASTY WITH STENT PLACEMENT  03/28/2015   Distal 80% to normal Stent, Dilation Balloon   CORONARY ARTERY BYPASS GRAFT     4 VESSELS   CORONARY/GRAFT ACUTE MI REVASCULARIZATION N/A 09/16/2018   Procedure: Coronary/Graft Acute MI Revascularization;  Surgeon: Isaias Cowman, MD;  Location: [facility];  Service: Cardiovascular;  Laterality: N/A;   EYE SURGERY     bilateral cataract 2017 Dr. Lyndel Safe [facility]   foot and ankle repair Right 2005   FRACTURE SURGERY     ANKLE PLATE AND SCREWS   GALLBLADER     JOINT REPLACEMENT     left total hip 11/11/15   JOINT REPLACEMENT     right total hip 12/2017 Dr. Rudene Christians    KNEE ARTHROSCOPY Right    LEFT HEART CATH AND CORONARY ANGIOGRAPHY N/A 09/16/2018   Procedure: LEFT HEART CATH AND CORONARY ANGIOGRAPHY;  Surgeon: Isaias Cowman, MD;  Location: [facility];  Service: Cardiovascular;  Laterality: N/A;   LUMBAR SPINE FUSION ONE LEVEL  05/03/2013   RENAL ARTERY STENT Right    ROTATOR CUFF REPAIR Right 2006   TONSILLECTOMY     TOTAL HIP ARTHROPLASTY Left 11/11/2015   Procedure: TOTAL HIP ARTHROPLASTY ANTERIOR APPROACH;  Surgeon: Hessie Knows, MD;  Location: [facility] ORS;  Service: Orthopedics;  Laterality: Left;   TOTAL HIP ARTHROPLASTY Right 12/13/2017   Procedure: TOTAL HIP ARTHROPLASTY ANTERIOR APPROACH;  Surgeon: Hessie Knows, MD;  Location: [facility] ORS;  Service: Orthopedics;  Laterality: Right;   [place] LEG  03/2015   Family History  Problem Relation Age of Onset   CVA Father    Stroke Father    Lung cancer Mother    Arthritis Mother    Stomach cancer Sister    Colon cancer Sister    Diabetes Brother    Social History   Socioeconomic History   Marital status: Married    Spouse name: Not on file   Number of children: Not on file   Years of education: Not on file   Highest education level: Not on file  Occupational History   Not on file  Social Needs   Financial resource strain: Not very hard   Food insecurity    Worry: Never true    Inability: Never true   Transportation needs    Medical: No    Non-medical: No  Tobacco Use   Smoking status: Former Smoker    Packs/day: 1.00    Years: 3.00    Pack years: 3.00    Types: Cigarettes    Quit date:  06/30/1962    Years since quitting: 57.0   Smokeless tobacco: Former Systems developer    Types: Chew    Quit date: 12/05/1968  Substance and Sexual Activity   Alcohol use: No   Drug use: No   Sexual activity: Not Currently  Lifestyle   Physical activity    Days per week: 0 days    Minutes per session: 0 min   Stress:  Not at all  Relationships   Social connections    Talks on phone: Patient refused    Gets together: Patient refused    Attends religious service: Patient refused    Active member of club or organization: Patient refused    Attends meetings of clubs or organizations: Patient refused    Relationship status: Patient refused  Other Topics Concern   Not on file  Social History Narrative   Married     Outpatient Encounter Medications as of 07/04/2019  Medication Sig   amitriptyline (ELAVIL) 50 MG tablet Take 0.5 tablets (25 mg total) by mouth at bedtime as needed.   amLODipine (NORVASC) 5 MG tablet Take 1 tablet (5 mg total) by mouth 2 (two) times daily. If blood pressure >140/>90 make take another 1 pill of 5 mg   aspirin EC 81 MG EC tablet Take 1 tablet (81 mg total) by mouth daily.   carvedilol (COREG) 25 MG tablet Take 1 tablet (25 mg total) by mouth 2 (two) times daily with a meal.   Cholecalciferol 50000 units TABS Take 1 tablet by mouth once a week.   cloNIDine (CATAPRES) 0.2 MG tablet Take by mouth 2 (two) times daily.    clopidogrel (PLAVIX) 75 MG tablet Take 75 mg by mouth daily.   colchicine 0.6 MG tablet TAKE 1 TABLET BY MOUTH ONCE DAILY   diclofenac sodium (VOLTAREN) 1 % GEL Apply 2 g topically 4 (four) times daily as needed (for pain).    dicyclomine (BENTYL) 10 MG capsule Take 10 mg by mouth 3 (three) times daily before meals.    fluticasone (FLONASE) 50 MCG/ACT nasal spray Place 2 sprays into both nostrils daily.   furosemide (LASIX) 40 MG tablet Take 40 mg by mouth 2 (two) times daily.    gabapentin (NEURONTIN) 300 MG capsule take 2 capsules  (646m) by mouth three times a day   hydrALAZINE (APRESOLINE) 100 MG tablet TAKE 100 MG BY MOUTH THREE TIMES A DAY   insulin lispro (HUMALOG) 100 UNIT/ML KiwkPen Inject 14-32 Units into the skin 3 (three) times daily. (according to sliding scale - max 96u over 24 hours)   ipratropium (ATROVENT) 0.06 % nasal spray Place 2 sprays into both nostrils 4 (four) times daily.   isosorbide mononitrate (IMDUR) 120 MG 24 hr tablet TAKE 1 TABLET BY MOUTH EVERY DAY   levocetirizine (XYZAL) 5 MG tablet Take 1 tablet (5 mg total) by mouth every evening.   montelukast (SINGULAIR) 10 MG tablet Take 1 tablet (10 mg total) by mouth at bedtime.   Multiple Vitamin (MULTI-VITAMINS) TABS Take 1 tablet by mouth daily.   nitroGLYCERIN (NITROSTAT) 0.4 MG SL tablet Place 1 tablet (0.4 mg total) under the tongue every 5 (five) minutes x 3 doses as needed. For chest pain.  Call 911 if no relief after 3 tablets   Olopatadine HCl 0.6 % SOLN Place 2 sprays into the nose 2 (two) times daily.   omega-3 acid ethyl esters (LOVAZA) 1 g capsule Take 2 capsules (2 g total) by mouth 2 (two) times daily.   omeprazole (PRILOSEC) 40 MG capsule Take 40 mg by mouth 2 (two) times daily.    pravastatin (PRAVACHOL) 40 MG tablet Take 1 tablet (40 mg total) by mouth daily.   probenecid (BENEMID) 500 MG tablet Take 500 mg by mouth 2 (two) times daily.    sodium chloride HYPERTONIC 3 % nebulizer solution Take by nebulization 2 (two) times daily.   spironolactone (ALDACTONE) 25 MG tablet Take  12.5 mg by mouth daily.    TRESIBA FLEXTOUCH 200 UNIT/ML SOPN Inject 90 Units into the skin at bedtime.   vitamin C (ASCORBIC ACID) 500 MG tablet Take 500 mg by mouth daily.    Vitamin E 400 units TABS Take 400 Units by mouth 2 (two) times daily.   No facility-administered encounter medications on file as of 07/04/2019.     Activities of Daily Living In your present state of health, do you have any difficulty performing the following  activities: 07/04/2019 09/16/2018  Hearing? Y N  Comment Hearing aids -  Vision? N N  Difficulty concentrating or making decisions? Y N  Walking or climbing stairs? Y N  Comment Unsteady gait -  Dressing or bathing? N N  Doing errands, shopping? Y N  Comment Dementia -  Preparing Food and eating ? N -  Using the Toilet? N -  In the past six months, have you accidently leaked urine? Y -  Comment Managed with daily brief -  Do you have problems with loss of bowel control? N -  Managing your Medications? Y -  Comment Wife manages -  Managing your Finances? Y -  Comment Wife manages -  Housekeeping or managing your Housekeeping? Y -  Comment Shared with wife -  Some recent data might be hidden    Patient Care Team: McLean-Scocuzza, Nino Glow, MD as PCP - General (Internal Medicine) Bary Castilla, Forest Gleason, MD (General Surgery) Lollie Sails, MD as Consulting Physician (Gastroenterology)   Assessment:   This is a routine wellness examination for Chase Cabrera.  I connected with patient 07/04/19 at 11:30 AM EDT by an audio enabled telemedicine application and verified that I am speaking with the correct person using two identifiers. Patient stated full name and DOB. Patient gave permission to continue with virtual visit. Patient's location was at home and Nurse's location was at [place] office.   Health Maintenance Due: -Eye Exam- scheduled 07/2019 -Foot Exam- followed by Endocrinology -Hgb A1c- 06/14/19 (6.6) Update all pending maintenance due as appropriate.   See completed HM at the end of note.   Eye: Visual acuity not assessed. Virtual visit. Wears corrective lenses. Followed by their ophthalmologist every 12 months.  Retinopathy- none reported.  Dental: UTD   Dentures- no  Hearing: Hearing aids- yes  Safety:  Patient feels safe at home- yes Patient does have smoke detectors at home- yes Patient does wear sunscreen or protective clothing when in direct sunlight -  yes Patient does wear seat belt when in a moving vehicle - yes Patient drives- yes Adequate lighting in walkways free from debris- yes Grab bars and handrails used as appropriate- yes Ambulates with walker/electric scooter as an assistive device Cell phone on person when ambulating outside of the home- yes  Social: Alcohol intake - no  Smoking history- former    Smokers in home? none Illicit drug use? none  Depression: PHQ 2 &9 complete. See screening below. Denies irritability, anhedonia, sadness/tearfullness.  Stable.   Falls: See screening below.    Medication: Taking as directed and without issues. Wife manages medications.   Covid-19: Precautions and sickness symptoms discussed. Wears mask, social distancing, hand hygiene as appropriate.   Activities of Daily Living Patient denies needing assistance with: feeding themselves, getting from bed to chair, getting to the toilet, bathing/showering, dressing.  Assisted by wife and granddaughter with household, money managing,  and preparing meals as needed.   Memory: Alert Dementia and Mixed Alzheimer's- followed by neurology  BMI- discussed the importance of a healthy diet, water intake and the benefits of aerobic exercise.  Educational material provided.  Physical activity- no routine  Diet: Low sodium Water: good intake Caffeine: none  Other Providers Patient Care Team: McLean-Scocuzza, Nino Glow, MD as PCP - General (Internal Medicine) Bary Castilla, Forest Gleason, MD (General Surgery) Lollie Sails, MD as Consulting Physician (Gastroenterology)  Exercise Activities and Dietary recommendations Current Exercise Habits: The patient does not participate in regular exercise at present  Goals     Follow up with Primary Care Provider     As needed       Fall Risk Fall Risk  07/04/2019 02/15/2019 10/05/2018 08/31/2018 08/08/2018  Falls in the past year? 0 1 1 1 1   Number falls in past yr: - 1 1 1 1   Comment - - - - -   Injury with Fall? - - 0 1 -  Risk Factor Category  - - - - -  Risk for fall due to : - History of fall(s) - - -  Follow up - - - - -    Timed Get Up and Go Performed: no, virtual visit  Depression Screen PHQ 2/9 Scores 07/04/2019 10/05/2018 04/14/2018 02/09/2018  PHQ - 2 Score 0 0 0 0  PHQ- 9 Score - - - -    Cognitive Function MMSE - Mini Mental State Exam 04/14/2018  Not completed: Refused        Immunization History  Administered Date(s) Administered   Fluad Quad(high Dose 65+) 06/11/2019   Influenza, High Dose Seasonal PF 08/09/2017, 07/18/2018   Influenza-Unspecified 07/02/2013, 07/04/2014, 07/14/2016, 08/09/2017, 07/14/2018   Pneumococcal Conjugate-13 10/03/2017   Pneumococcal Polysaccharide-23 03/08/2014   Screening Tests Health Maintenance  Topic Date Due   FOOT EXAM  01/20/2017   OPHTHALMOLOGY EXAM  07/08/2017   HEMOGLOBIN A1C  05/30/2019   TETANUS/TDAP  09/14/2023   COLONOSCOPY  04/14/2027   INFLUENZA VACCINE  Completed   Hepatitis C Screening  Completed   PNA vac Low Risk Adult  Addressed       Plan:   Keep all routine maintenance appointments.   Follow up with your doctor 07/05/19 @ 11:00  Medicare Attestation I have personally reviewed: The patient's medical and social history Their use of alcohol, tobacco or illicit drugs Their current medications and supplements The patient's functional ability including ADLs,fall risks, home safety risks, cognitive, and hearing and visual impairment Diet and physical activities Evidence for depression   In addition, I have reviewed and discussed with patient certain preventive protocols, quality metrics, and best practice recommendations. A written personalized care plan for preventive services as well as general preventive health recommendations were provided to patient via mail.     Varney Biles, LPN  [id number]

## 2019-07-04 NOTE — Patient Instructions (Addendum)
  Mr. Chase Cabrera , Thank you for taking time to come for your Medicare Wellness Visit. I appreciate your ongoing commitment to your health goals. Please review the following plan we discussed and let me know if I can assist you in the future.   These are the goals we discussed: Goals    . Follow up with Primary Care Provider     As needed       This is a list of the screening recommended for you and due dates:  Health Maintenance  Topic Date Due  . Complete foot exam   01/20/2017  . Eye exam for diabetics  07/08/2017  . Hemoglobin A1C  05/30/2019  . Tetanus Vaccine  09/14/2023  . Colon Cancer Screening  04/14/2027  . Flu Shot  Completed  .  Hepatitis C: One time screening is recommended by Center for Disease Control  (CDC) for  adults born from [birth year] through 1967.   Completed  . Pneumonia vaccines  Addressed

## 2019-07-05 ENCOUNTER — Ambulatory Visit: Payer: Medicare Other

## 2019-07-05 ENCOUNTER — Other Ambulatory Visit: Payer: Self-pay

## 2019-07-05 ENCOUNTER — Encounter: Payer: Self-pay | Admitting: Internal Medicine

## 2019-07-05 ENCOUNTER — Ambulatory Visit (INDEPENDENT_AMBULATORY_CARE_PROVIDER_SITE_OTHER): Payer: Medicare Other | Admitting: Internal Medicine

## 2019-07-05 VITALS — BP 134/56 | HR 55 | Temp 98.2°F | Ht 68.0 in | Wt 197.4 lb

## 2019-07-05 DIAGNOSIS — L989 Disorder of the skin and subcutaneous tissue, unspecified: Secondary | ICD-10-CM

## 2019-07-05 DIAGNOSIS — R2232 Localized swelling, mass and lump, left upper limb: Secondary | ICD-10-CM | POA: Diagnosis not present

## 2019-07-05 DIAGNOSIS — E1142 Type 2 diabetes mellitus with diabetic polyneuropathy: Secondary | ICD-10-CM | POA: Diagnosis not present

## 2019-07-05 DIAGNOSIS — E559 Vitamin D deficiency, unspecified: Secondary | ICD-10-CM | POA: Diagnosis not present

## 2019-07-05 DIAGNOSIS — Z1283 Encounter for screening for malignant neoplasm of skin: Secondary | ICD-10-CM | POA: Diagnosis not present

## 2019-07-05 DIAGNOSIS — J810 Acute pulmonary edema: Secondary | ICD-10-CM

## 2019-07-05 DIAGNOSIS — N401 Enlarged prostate with lower urinary tract symptoms: Secondary | ICD-10-CM

## 2019-07-05 DIAGNOSIS — Z125 Encounter for screening for malignant neoplasm of prostate: Secondary | ICD-10-CM

## 2019-07-05 DIAGNOSIS — I5032 Chronic diastolic (congestive) heart failure: Secondary | ICD-10-CM | POA: Diagnosis not present

## 2019-07-05 DIAGNOSIS — I10 Essential (primary) hypertension: Secondary | ICD-10-CM

## 2019-07-05 NOTE — Patient Instructions (Addendum)
Need to drink no more than total 50-55 ounces or < 2 L 1.5 L would be ideal   NORMAL LEFT VENTRICULAR SYSTOLIC FUNCTION WITH MILD LVH  ELEVATED LA PRESSURES WITH DIASTOLIC DYSFUNCTION  NORMAL RIGHT VENTRICULAR SYSTOLIC FUNCTION  VALVULAR REGURGITATION: TRIVIAL MR, TRIVIAL PR, MILD TR  NO VALVULAR STENOSIS     Compared with prior Echo study on 09/20/2018: NO SIGNIFICANT CHANGES    (Report version 3.0)          Interpreted and Electronically signed  Perform. by: Jonni Sanger Dhimitri, BS, RDCS       by: Corey Skains, M  Resp.Person: Domenic Schwab, BS, RDCS       On: 06/26/2019 15:44:42  Biotene mouthwash  Chew sugarfree gum or candy    [facility] dermatology [address]  Left forehead lesion

## 2019-07-05 NOTE — Progress Notes (Signed)
Chief Complaint  Patient presents with  . Follow-up   HFU from 69/67-89/38/10 for diastolic CHF  1.diastolic CHF on lasix 80 mg qod did not want to take qd due to increased freq of urination still c/o sob had mild pulm edema in b/l lungs on CXR 06/25/2019 declines repeat today  His weight increased from 187 to 197.4  2. HTN controlled 134/56 on norvasc 5, coreg 25 mg bid, lasix 80 qod, imdur 120 ER, spironolactone 25 mg qd, clonodine 0.2  3. DM 2 A1C 6.6 06/14/2019 f/u Dr. Gabriel Carina  4. Left upper arm c/o mass x 4-5 months and painful nothing tried no trauma but taking BP on this side hurts  5. C/o left forehead lesion with h/o NMSC  Review of Systems  Constitutional: Negative for weight loss.  HENT: Negative for hearing loss.   Eyes: Negative for blurred vision.  Respiratory: Positive for shortness of breath.   Cardiovascular: Negative for chest pain.  Gastrointestinal: Negative for abdominal pain.  Musculoskeletal:       +left upper arm pain    Skin: Negative for rash.  Neurological: Negative for headaches.  Psychiatric/Behavioral: Negative for depression.   Past Medical History:  Diagnosis Date  . Arthritis   . Atrial fibrillation ([place])   . CAD (coronary artery disease)    6 STENTS  . CHF (congestive heart failure) ([place])   . Chicken pox   . Colon polyp   . Corneal dystrophy    bilateral  . Crohn's disease ([place])   . Diabetes ([place]) 2002  . Dysrhythmia    A-FIB AND PAF  . GERD (gastroesophageal reflux disease)   . Gout   . History of kidney stones   . History of shingles   . Hyperlipemia   . Hypertension    2/2 b/l RAS >60% [place] 06/2018 s/p right stent   . Kidney stones   . Myocardial infarction ([place])    x4, last one in 2010  . Peripheral neuropathy   . Peripheral neuropathy   . Peripheral neuropathy   . PUD (peptic ulcer disease)   . Rectus sheath hematoma    02/18/18 8.9 x 4.5 cm then 02/19/18 7.9 x 4.5 cm   . Renal artery stenosis ([place])   . Renal artery stenosis  ([place])   . Salzmann's nodular dystrophy 2012  . Sleep apnea   . Spinal stenosis    Past Surgical History:  Procedure Laterality Date  . ABLATION  2012  . APPLICATION VERTERBRAL DEFECT PROSTHETIC  05/01/2013  . ARTHRODESIS ANTERIOR LUMBAR SPINE  05/01/2013  . BACK SURGERY     lumbar fusion  . CARDIAC CATHETERIZATION    . CARDIAC ELECTROPHYSIOLOGY STUDY AND ABLATION    . CARDIAC SURGERY    . CARDIOVERSION    . CHOLECYSTECTOMY    . COLONOSCOPY WITH PROPOFOL N/A 04/09/2016   Completed for chronic diarrhea.  Few scattered diverticuli.  No mucosal lesions.  Surgeon: Lollie Sails, MD;  Location: [facility];  Service: Endoscopy;  Laterality: N/A;  . CORNEAL EYE SURGERY Bilateral 07/28/11    09/22/2011  . CORONARY ANGIOPLASTY    . CORONARY ANGIOPLASTY WITH STENT PLACEMENT  03/28/2015   Distal 80% to normal Stent, Dilation Balloon  . CORONARY ARTERY BYPASS GRAFT     4 VESSELS  . CORONARY/GRAFT ACUTE MI REVASCULARIZATION N/A 09/16/2018   Procedure: Coronary/Graft Acute MI Revascularization;  Surgeon: Isaias Cowman, MD;  Location: [place] [facility];  Service: Cardiovascular;  Laterality: N/A;  . EYE SURGERY  bilateral cataract 2017 Dr. Megan Mans  . foot and ankle repair Right 2005  . FRACTURE SURGERY     ANKLE PLATE AND SCREWS  . GALLBLADER    . JOINT REPLACEMENT     left total hip 11/11/15  . JOINT REPLACEMENT     right total hip 12/2017 Dr. Rudene Christians   . KNEE ARTHROSCOPY Right   . LEFT HEART CATH AND CORONARY ANGIOGRAPHY N/A 09/16/2018   Procedure: LEFT HEART CATH AND CORONARY ANGIOGRAPHY;  Surgeon: Isaias Cowman, MD;  Location: [place] [facility];  Service: Cardiovascular;  Laterality: N/A;  . LUMBAR SPINE FUSION ONE LEVEL  05/03/2013  . RENAL ARTERY STENT Right   . ROTATOR CUFF REPAIR Right 2006  . TONSILLECTOMY    . TOTAL HIP ARTHROPLASTY Left 11/11/2015   Procedure: TOTAL HIP ARTHROPLASTY ANTERIOR APPROACH;  Surgeon: Hessie Knows, MD;  Location: [facility] ORS;   Service: Orthopedics;  Laterality: Left;  . TOTAL HIP ARTHROPLASTY Right 12/13/2017   Procedure: TOTAL HIP ARTHROPLASTY ANTERIOR APPROACH;  Surgeon: Hessie Knows, MD;  Location: [facility] OR[facility];  Service: Orthopedics;  Laterality: Right;  . TRANSCATH PLACEMENT INTRAVASCULAR STENT LEG  03/2015   Family History  Problem Relation Age of Onset  . CVA Father   . Stroke Father   . Lung cancer Mother   . Arthritis Mother   . Stomach cancer Sister   . Colon cancer Sister   . Diabetes Brother    Social History   Socioeconomic History  . Marital status: Married    Spouse name: Not on file  . Number of children: Not on file  . Years of education: Not on file  . Highest education level: Not on file  Occupational History  . Not on file  Social Needs  . Financial resource strain: Not very hard  . Food insecurity    Worry: Never true    Inability: Never true  . Transportation needs    Medical: No    Non-medical: No  Tobacco Use  . Smoking status: Former Smoker    Packs/day: 1.00    Years: 3.00    Pack years: 3.00    Types: Cigarettes    Quit date: 06/30/1962    Years since quitting: 57.0  . Smokeless tobacco: Former Systems developer    Types: [place] date: 12/05/1968  Substance and Sexual Activity  . Alcohol use: No  . Drug use: No  . Sexual activity: Not Currently  Lifestyle  . Physical activity    Days per week: 0 days    Minutes per session: 0 min  . Stress: Not at all  Relationships  . Social Herbalist on phone: Patient refused    Gets together: Patient refused    Attends religious service: Patient refused    Active member of club or organization: Patient refused    Attends meetings of clubs or organizations: Patient refused    Relationship status: Patient refused  . Intimate partner violence    Fear of current or ex partner: No    Emotionally abused: No    Physically abused: No    Forced sexual activity: No  Other Topics Concern  . Not on file  Social History  Narrative   Married    Current Meds  Medication Sig  . amitriptyline (ELAVIL) 50 MG tablet Take 0.5 tablets (25 mg total) by mouth at bedtime as needed.  Marland Kitchen amLODipine (NORVASC) 5 MG tablet Take 1 tablet (5 mg total) by mouth  2 (two) times daily. If blood pressure >140/>90 make take another 1 pill of 5 mg  . aspirin EC 81 MG EC tablet Take 1 tablet (81 mg total) by mouth daily.  . carvedilol (COREG) 25 MG tablet Take 1 tablet (25 mg total) by mouth 2 (two) times daily with a meal.  . Cholecalciferol 50000 units TABS Take 1 tablet by mouth once a week.  . cloNIDine (CATAPRES) 0.2 MG tablet Take by mouth 2 (two) times daily.   . clopidogrel (PLAVIX) 75 MG tablet Take 75 mg by mouth daily.  . colchicine 0.6 MG tablet TAKE 1 TABLET BY MOUTH ONCE DAILY  . diclofenac sodium (VOLTAREN) 1 % GEL Apply 2 g topically 4 (four) times daily as needed (for pain).   Marland Kitchen dicyclomine (BENTYL) 10 MG capsule Take 10 mg by mouth 3 (three) times daily before meals.   . fluticasone (FLONASE) 50 MCG/ACT nasal spray Place 2 sprays into both nostrils daily.  . furosemide (LASIX) 40 MG tablet Take 80 mg by mouth every other day.   . gabapentin (NEURONTIN) 300 MG capsule take 2 capsules (690m) by mouth three times a day  . hydrALAZINE (APRESOLINE) 100 MG tablet TAKE 100 MG BY MOUTH THREE TIMES A DAY  . insulin lispro (HUMALOG) 100 UNIT/ML KiwkPen Inject 14-32 Units into the skin 3 (three) times daily. (according to sliding scale - max 96u over 24 hours)  . ipratropium (ATROVENT) 0.06 % nasal spray Place 2 sprays into both nostrils 4 (four) times daily.  . isosorbide mononitrate (IMDUR) 120 MG 24 hr tablet TAKE 1 TABLET BY MOUTH EVERY DAY  . levocetirizine (XYZAL) 5 MG tablet Take 1 tablet (5 mg total) by mouth every evening.  . montelukast (SINGULAIR) 10 MG tablet Take 1 tablet (10 mg total) by mouth at bedtime.  . Multiple Vitamin (MULTI-VITAMINS) TABS Take 1 tablet by mouth daily.  . nitroGLYCERIN (NITROSTAT) 0.4 MG SL  tablet Place 1 tablet (0.4 mg total) under the tongue every 5 (five) minutes x 3 doses as needed. For chest pain.  Call 911 if no relief after 3 tablets  . Olopatadine HCl 0.6 % SOLN Place 2 sprays into the nose 2 (two) times daily.  .Marland Kitchenomega-3 acid ethyl esters (LOVAZA) 1 g capsule Take 2 capsules (2 g total) by mouth 2 (two) times daily.  .Marland Kitchenomeprazole (PRILOSEC) 40 MG capsule Take 40 mg by mouth 2 (two) times daily.   . pravastatin (PRAVACHOL) 40 MG tablet Take 1 tablet (40 mg total) by mouth daily.  . probenecid (BENEMID) 500 MG tablet Take 500 mg by mouth 2 (two) times daily.   . sodium chloride HYPERTONIC 3 % nebulizer solution Take by nebulization 2 (two) times daily.  .Marland Kitchenspironolactone (ALDACTONE) 25 MG tablet Take 12.5 mg by mouth daily.   . TRESIBA FLEXTOUCH 200 UNIT/ML SOPN Inject 90 Units into the skin at bedtime.  . vitamin C (ASCORBIC ACID) 500 MG tablet Take 500 mg by mouth daily.   . Vitamin E 400 units TABS Take 400 Units by mouth 2 (two) times daily.   Allergies  Allergen Reactions  . Allopurinol Diarrhea, Other (See Comments) and Nausea And Vomiting    Other Reaction: GI Upset  . Atenolol Other (See Comments)    Other Reaction: bradycardia  . Atorvastatin Other (See Comments)    Other Reaction: muscle aches  . Ramipril Rash  . Rosuvastatin Other (See Comments)    Muscle aches  . Simvastatin Other (See Comments)  Other Reaction: MUSCLE ACHES (Zocor)  . Valsartan Other (See Comments)    Other Reaction: facial swelling  . Cardizem [Diltiazem] Rash   Recent Results (from the past 2160 hour(s))  Troponin I (High Sensitivity)     Status: None   Collection Time: 06/24/19  2:40 PM  Result Value Ref Range   Troponin I (High Sensitivity) 13 <18 ng/L    Comment: (NOTE) Elevated high sensitivity troponin I (hsTnI) values and significant  changes across serial measurements may suggest ACS but many other  chronic and acute conditions are known to elevate hsTnI results.   Refer to the "Links" section for chest pain algorithms and additional  guidance. Performed at [facility], [address] [place], [place] [address]   CBC with Differential     Status: Abnormal   Collection Time: 06/24/19  2:40 PM  Result Value Ref Range   WBC 8.3 4.0 - 10.5 K/uL   RBC 3.59 (L) 4.22 - 5.81 MIL/uL   Hemoglobin 12.6 (L) 13.0 - 17.0 g/dL   HCT 36.8 (L) 39.0 - 52.0 %   MCV 102.5 (H) 80.0 - 100.0 fL   MCH 35.1 (H) 26.0 - 34.0 pg   MCHC 34.2 30.0 - 36.0 g/dL   RDW 12.9 11.5 - 15.5 %   Platelets 211 150 - 400 K/uL   nRBC 0.0 0.0 - 0.2 %   Neutrophils Relative % 71 %   Neutro Abs 5.8 1.7 - 7.7 K/uL   Lymphocytes Relative 14 %   Lymphs Abs 1.2 0.7 - 4.0 K/uL   Monocytes Relative 12 %   Monocytes Absolute 1.0 0.1 - 1.0 K/uL   Eosinophils Relative 3 %   Eosinophils Absolute 0.2 0.0 - 0.5 K/uL   Basophils Relative 0 %   Basophils Absolute 0.0 0.0 - 0.1 K/uL   Immature Granulocytes 0 %   Abs Immature Granulocytes 0.02 0.00 - 0.07 K/uL    Comment: Performed at [facility], [address] [place], [place] [address]  Comprehensive metabolic panel     Status: Abnormal   Collection Time: 06/24/19  2:40 PM  Result Value Ref Range   Sodium 137 135 - 145 mmol/L   Potassium 3.6 3.5 - 5.1 mmol/L   Chloride 98 98 - 111 mmol/L   CO2 25 22 - 32 mmol/L   Glucose, Bld 166 (H) 70 - 99 mg/dL   BUN 19 8 - 23 mg/dL   Creatinine, Ser 1.04 0.61 - 1.24 mg/dL   Calcium 9.4 8.9 - 10.3 mg/dL   Total Protein 8.0 6.5 - 8.1 g/dL   Albumin 3.9 3.5 - 5.0 g/dL   AST 27 15 - 41 U/L   ALT 27 0 - 44 U/L   Alkaline Phosphatase 96 38 - 126 U/L   Total Bilirubin 0.8 0.3 - 1.2 mg/dL   GFR calc non Af Amer >60 >60 mL/min   GFR calc Af Amer >60 >60 mL/min   Anion gap 14 5 - 15    Comment: Performed at [facility], [address] [place], [place] [address]  Protime-INR (order if Patient is taking Coumadin / Warfarin)     Status: None   Collection Time: 06/24/19   2:40 PM  Result Value Ref Range   Prothrombin Time 14.2 11.4 - 15.2 seconds   INR 1.1 0.8 - 1.2    Comment: (NOTE) INR goal varies based on device and disease states. Performed at [facility], [place], [place]  [address]   Brain natriuretic peptide     Status: Abnormal   Collection Time: 06/24/19  2:40 PM  Result Value Ref Range   B Natriuretic Peptide 266.0 (H) 0.0 - 100.0 pg/mL    Comment: Performed at [facility], [place], [place] [address]  Troponin I (High Sensitivity)     Status: None   Collection Time: 06/24/19  4:30 PM  Result Value Ref Range   Troponin I (High Sensitivity) 12 <18 ng/L    Comment: (NOTE) Elevated high sensitivity troponin I (hsTnI) values and significant  changes across serial measurements may suggest ACS but many other  chronic and acute conditions are known to elevate hsTnI results.  Refer to the "Links" section for chest pain algorithms and additional  guidance. Performed at [facility], [address] [place], [place] [address]    Objective  Body mass index is 30.01 kg/m. Wt Readings from Last 3 Encounters:  07/05/19 197 lb 6.4 oz (89.5 kg)  06/24/19 198 lb (89.8 kg)  06/11/19 197 lb (89.4 kg)   Temp Readings from Last 3 Encounters:  07/05/19 98.2 F (36.8 C) (Oral)  06/24/19 97.8 F (36.6 C) (Oral)  06/11/19 98.4 F (36.9 C) (Temporal)   BP Readings from Last 3 Encounters:  07/05/19 (!) 134/56  06/24/19 129/61  06/11/19 140/82   Pulse Readings from Last 3 Encounters:  07/05/19 (!) 55  06/24/19 (!) 47  06/11/19 60    Physical Exam Vitals signs and nursing note reviewed.  Constitutional:      Appearance: Normal appearance. He is well-developed and well-groomed. He is obese.     Comments: +mask on    HENT:     Head: Normocephalic and atraumatic.  Eyes:     Conjunctiva/sclera: Conjunctivae normal.     Pupils: Pupils are equal, round, and reactive to light.   Cardiovascular:     Rate and Rhythm: Normal rate and regular rhythm.     Heart sounds: Normal heart sounds. No murmur.  Pulmonary:     Effort: Pulmonary effort is normal.     Breath sounds: Normal breath sounds.     Comments: +crackles b/l lower lung fields   Abdominal:     General: Abdomen is flat. Bowel sounds are normal.  Skin:    General: Skin is warm and dry.       Neurological:     General: No focal deficit present.     Mental Status: He is alert and oriented to person, place, and time. Mental status is at baseline.     Gait: Gait normal.     Comments: +rollator today    Psychiatric:        Attention and Perception: Attention and perception normal.        Mood and Affect: Mood and affect normal.        Speech: Speech normal.        Behavior: Behavior normal. Behavior is cooperative.        Thought Content: Thought content normal.        Cognition and Memory: Cognition and memory normal.        Judgment: Judgment normal.     Assessment  Plan  Arm mass, left - Plan: US SOFT TISSUE UPPER EXTREMITY LIMITED LEFT (NON-VASCULAR) If neg consider MRI left humerus   Skin cancer screening - Plan: Ambulatory referral to Dermatology Skin lesion of face - Plan: Ambulatory referral to Dermatology  Vitamin D deficiency - Plan: Vitamin D (  25 hydroxy)  Screening for prostate cancer - Plan: PSA  DM type 2 with diabetic peripheral neuropathy ([place]) A1C 6.6  Cont meds  Eye exam upcoming 07/2019   Essential hypertension -cont meds   Chronic diastolic CHF (congestive heart failure) (HCC) with pulm edema noted CXR 06/24/09 Red reduce fluids to 1.5 L total per day  Lasix was rec 80 qd but pt wants to take qod   F/u cards [facility] 06/2019 had f/u prior to this visit   HM Flu shot utd pna 23 had 02/2014 UTD prevnar Disc Tdap and shingrix in future   Check vitamin D h/o low D3  PSAhad nl 10/03/17 0.5 consider DRE in future will recheck 2020  Colonoscopy had 04/[facility]  crohns on mesalamine 2.4 mg qd-negative colonoscopy with multiple bxs Consider repeat DEXA reviewed DEXA +osteoporosis noted 06/07/04 CT chest5/13/19 + lung nodules With h/o pulm nodules on CXR and CT chest10/11/16and former smoker -repeat CTA chest 06/13/18 neg pulmonary nodules  [facility] -H/o skin cancer  Provider: Dr. Olivia Mackie McLean-Scocuzza-Internal Medicine

## 2019-07-11 ENCOUNTER — Encounter: Payer: Self-pay | Admitting: Pulmonary Disease

## 2019-07-11 ENCOUNTER — Other Ambulatory Visit: Payer: Self-pay | Admitting: Internal Medicine

## 2019-07-11 DIAGNOSIS — I10 Essential (primary) hypertension: Secondary | ICD-10-CM

## 2019-07-11 MED ORDER — CLONIDINE HCL 0.2 MG PO TABS: 0.2000 mg | ORAL_TABLET | Freq: Two times a day (BID) | ORAL | 3 refills | Status: AC

## 2019-07-12 ENCOUNTER — Other Ambulatory Visit
Admission: RE | Admit: 2019-07-12 | Discharge: 2019-07-12 | Disposition: A | Discharge status: Home / Self Care | Payer: Medicare Other | Source: Ambulatory Visit | Attending: Internal Medicine | Admitting: Internal Medicine

## 2019-07-12 ENCOUNTER — Other Ambulatory Visit: Payer: Self-pay | Admitting: Internal Medicine

## 2019-07-12 DIAGNOSIS — E559 Vitamin D deficiency, unspecified: Secondary | ICD-10-CM | POA: Insufficient documentation

## 2019-07-12 DIAGNOSIS — Z125 Encounter for screening for malignant neoplasm of prostate: Secondary | ICD-10-CM | POA: Insufficient documentation

## 2019-07-12 DIAGNOSIS — Z85828 Personal history of other malignant neoplasm of skin: Secondary | ICD-10-CM | POA: Diagnosis not present

## 2019-07-12 DIAGNOSIS — L570 Actinic keratosis: Secondary | ICD-10-CM | POA: Diagnosis not present

## 2019-07-12 DIAGNOSIS — N401 Enlarged prostate with lower urinary tract symptoms: Secondary | ICD-10-CM | POA: Diagnosis not present

## 2019-07-12 DIAGNOSIS — D492 Neoplasm of unspecified behavior of bone, soft tissue, and skin: Secondary | ICD-10-CM | POA: Diagnosis not present

## 2019-07-12 DIAGNOSIS — C44319 Basal cell carcinoma of skin of other parts of face: Secondary | ICD-10-CM | POA: Diagnosis not present

## 2019-07-12 LAB — PSA: Prostatic Specific Antigen: 0.75 ng/mL (ref 0.00–4.00)

## 2019-07-12 LAB — VITAMIN D 25 HYDROXY (VIT D DEFICIENCY, FRACTURES): Vit D, 25-Hydroxy: 19.71 ng/mL — ABNORMAL LOW (ref 30–100)

## 2019-07-12 MED ORDER — CHOLECALCIFEROL 1.25 MG (50000 UT) PO TABS: 1.0000 | ORAL_TABLET | ORAL | 1 refills | Status: AC

## 2019-07-13 ENCOUNTER — Other Ambulatory Visit: Payer: Self-pay

## 2019-07-19 ENCOUNTER — Ambulatory Visit
Admission: RE | Admit: 2019-07-19 | Discharge: 2019-07-19 | Disposition: A | Discharge status: Home / Self Care | Payer: Medicare Other | Source: Ambulatory Visit | Attending: Internal Medicine | Admitting: Internal Medicine

## 2019-07-19 ENCOUNTER — Other Ambulatory Visit: Payer: Self-pay

## 2019-07-19 DIAGNOSIS — R2232 Localized swelling, mass and lump, left upper limb: Secondary | ICD-10-CM | POA: Diagnosis not present

## 2019-07-23 ENCOUNTER — Other Ambulatory Visit: Payer: Self-pay | Admitting: Internal Medicine

## 2019-07-23 DIAGNOSIS — M7989 Other specified soft tissue disorders: Secondary | ICD-10-CM

## 2019-07-23 DIAGNOSIS — D1722 Benign lipomatous neoplasm of skin and subcutaneous tissue of left arm: Secondary | ICD-10-CM

## 2019-07-27 ENCOUNTER — Ambulatory Visit: Payer: Medicare Other | Admitting: Pulmonary Disease

## 2019-08-08 ENCOUNTER — Ambulatory Visit: Payer: Medicare Other

## 2019-08-16 DIAGNOSIS — E785 Hyperlipidemia, unspecified: Secondary | ICD-10-CM | POA: Diagnosis not present

## 2019-08-16 DIAGNOSIS — Z87891 Personal history of nicotine dependence: Secondary | ICD-10-CM | POA: Diagnosis not present

## 2019-08-16 DIAGNOSIS — I480 Paroxysmal atrial fibrillation: Secondary | ICD-10-CM | POA: Diagnosis not present

## 2019-08-16 DIAGNOSIS — I10 Essential (primary) hypertension: Secondary | ICD-10-CM | POA: Diagnosis not present

## 2019-08-16 DIAGNOSIS — I701 Atherosclerosis of renal artery: Secondary | ICD-10-CM | POA: Diagnosis not present

## 2019-08-16 DIAGNOSIS — I2510 Atherosclerotic heart disease of native coronary artery without angina pectoris: Secondary | ICD-10-CM | POA: Diagnosis not present

## 2019-08-16 DIAGNOSIS — I5030 Unspecified diastolic (congestive) heart failure: Secondary | ICD-10-CM | POA: Diagnosis not present

## 2019-08-16 DIAGNOSIS — I110 Hypertensive heart disease with heart failure: Secondary | ICD-10-CM | POA: Diagnosis not present

## 2019-08-17 ENCOUNTER — Encounter: Payer: Self-pay | Admitting: Pulmonary Disease

## 2019-08-17 ENCOUNTER — Ambulatory Visit: Payer: Medicare Other | Admitting: Pulmonary Disease

## 2019-08-17 DIAGNOSIS — J3489 Other specified disorders of nose and nasal sinuses: Secondary | ICD-10-CM

## 2019-08-17 DIAGNOSIS — R0981 Nasal congestion: Secondary | ICD-10-CM

## 2019-08-17 DIAGNOSIS — R0602 Shortness of breath: Secondary | ICD-10-CM

## 2019-08-17 DIAGNOSIS — J479 Bronchiectasis, uncomplicated: Secondary | ICD-10-CM

## 2019-08-17 DIAGNOSIS — J328 Other chronic sinusitis: Secondary | ICD-10-CM

## 2019-08-17 NOTE — Progress Notes (Signed)
   Subjective:    Patient ID: Chase Cabrera, male    DOB: 1946-06-02, 73 y.o.   MRN: [id number] Virtual Visit Via Video or Telephone Note:   This visit type was conducted due to national recommendations for restrictions regarding the COVID-19 pandemic .  This format is felt to be most appropriate for this patient at this time.  All issues noted in this document were discussed and addressed.  No physical exam was performed (except for noted visual exam findings with Video Visits).    I connected with Chase Cabrera by telephone at 1215 hrs. and verified that I was speaking with the correct person using two identifiers.  Patient is very hard of hearing and deferred to his wife to provide history and communication assistance. Location patient: home Location provider: [facility] Persons participating in the virtual visit: patient, patient's wife (assisting with communication) and physician   I discussed the limitations, risks, security and privacy concerns of performing an evaluation and management service by video and the availability of in person appointments. The patient and his wife expressed understanding and agreed to proceed.   HPI Persistent posterior rhinorrhea  Canceled ENT appointment   Review of Systems     Objective:   Physical Exam  No physical exam done due to this being telephone visit.     Assessment & Plan:   Referral to ENT again   1. Bronchiectasis without complication (HCC) (Primary)  2. SOB (shortness of breath)  3. Other chronic sinusitis  4. Nasal congestion with rhinorrhea  Patient Instructions  1.  Keep your appointment for breathing test in January  2.  We will refer you again to ENT (ear nose and throat) please do not cancel that appointment.  3.  We will see you in follow-up after your breathing tests are done in January

## 2019-08-17 NOTE — Patient Instructions (Addendum)
1.  Keep your appointment for breathing test in January  2.  We will refer you again to ENT (ear nose and throat) please do not cancel that appointment.  3.  We will see you in follow-up after your breathing tests are done in January

## 2019-08-27 ENCOUNTER — Ambulatory Visit
Admission: RE | Admit: 2019-08-27 | Discharge: 2019-08-27 | Disposition: A | Discharge status: Home / Self Care | Payer: Medicare Other | Source: Ambulatory Visit | Attending: Internal Medicine | Admitting: Internal Medicine

## 2019-08-27 ENCOUNTER — Telehealth: Payer: Self-pay | Admitting: Internal Medicine

## 2019-08-27 ENCOUNTER — Other Ambulatory Visit: Payer: Self-pay

## 2019-08-27 DIAGNOSIS — D1722 Benign lipomatous neoplasm of skin and subcutaneous tissue of left arm: Secondary | ICD-10-CM | POA: Diagnosis present

## 2019-08-27 DIAGNOSIS — M7989 Other specified soft tissue disorders: Secondary | ICD-10-CM | POA: Diagnosis present

## 2019-08-27 LAB — POCT I-STAT CREATININE: Creatinine, Ser: 1.6 mg/dL — ABNORMAL HIGH (ref 0.61–1.24)

## 2019-08-27 MED ORDER — GADOBUTROL 1 MMOL/ML IV SOLN
8.0000 mL | Freq: Once | INTRAVENOUS | Status: AC | PRN
  Administered 2019-08-27: 8 mL via INTRAVENOUS

## 2019-08-27 NOTE — Telephone Encounter (Signed)
Left message for patient to return call back. PEC may give results and obtain information.

## 2019-08-27 NOTE — Telephone Encounter (Signed)
Call pharmacy pt should be on clonidine 0.2 mg bid   D/c clonidine 0.1 bid

## 2019-08-30 ENCOUNTER — Telehealth: Payer: Self-pay | Admitting: Internal Medicine

## 2019-08-30 NOTE — Telephone Encounter (Signed)
Patient would like call about his MRI results.

## 2019-08-31 NOTE — Telephone Encounter (Signed)
Called patient and wife hung up phone.

## 2019-09-03 ENCOUNTER — Telehealth: Payer: Self-pay | Admitting: Internal Medicine

## 2019-09-03 NOTE — Telephone Encounter (Signed)
Pt wife called to get results

## 2019-09-03 NOTE — Telephone Encounter (Signed)
Patient was informed of results.  Patient understood and no questions, comments, or concerns at this time.

## 2019-09-03 NOTE — Telephone Encounter (Signed)
error 

## 2019-09-04 ENCOUNTER — Other Ambulatory Visit: Payer: Self-pay

## 2019-09-04 ENCOUNTER — Inpatient Hospital Stay
Admission: EM | Admit: 2019-09-04 | Discharge: 2019-09-09 | DRG: 871 | Disposition: A | Discharge status: Other Acute Inpatient Hospital | Payer: Medicare Other | Attending: Family Medicine | Admitting: Family Medicine

## 2019-09-04 ENCOUNTER — Emergency Department: Payer: Medicare Other

## 2019-09-04 DIAGNOSIS — U071 COVID-19: Secondary | ICD-10-CM | POA: Diagnosis present

## 2019-09-04 DIAGNOSIS — I480 Paroxysmal atrial fibrillation: Secondary | ICD-10-CM | POA: Diagnosis present

## 2019-09-04 DIAGNOSIS — Z955 Presence of coronary angioplasty implant and graft: Secondary | ICD-10-CM

## 2019-09-04 DIAGNOSIS — E871 Hypo-osmolality and hyponatremia: Secondary | ICD-10-CM | POA: Diagnosis present

## 2019-09-04 DIAGNOSIS — I110 Hypertensive heart disease with heart failure: Secondary | ICD-10-CM | POA: Diagnosis present

## 2019-09-04 DIAGNOSIS — I2510 Atherosclerotic heart disease of native coronary artery without angina pectoris: Secondary | ICD-10-CM | POA: Diagnosis present

## 2019-09-04 DIAGNOSIS — J9601 Acute respiratory failure with hypoxia: Secondary | ICD-10-CM | POA: Diagnosis present

## 2019-09-04 DIAGNOSIS — J1282 Pneumonia due to coronavirus disease 2019: Secondary | ICD-10-CM | POA: Diagnosis present

## 2019-09-04 DIAGNOSIS — I10 Essential (primary) hypertension: Secondary | ICD-10-CM | POA: Diagnosis present

## 2019-09-04 DIAGNOSIS — G4733 Obstructive sleep apnea (adult) (pediatric): Secondary | ICD-10-CM | POA: Diagnosis present

## 2019-09-04 DIAGNOSIS — Z951 Presence of aortocoronary bypass graft: Secondary | ICD-10-CM

## 2019-09-04 DIAGNOSIS — Z87891 Personal history of nicotine dependence: Secondary | ICD-10-CM

## 2019-09-04 DIAGNOSIS — M4800 Spinal stenosis, site unspecified: Secondary | ICD-10-CM | POA: Diagnosis present

## 2019-09-04 DIAGNOSIS — E876 Hypokalemia: Secondary | ICD-10-CM | POA: Diagnosis present

## 2019-09-04 DIAGNOSIS — I252 Old myocardial infarction: Secondary | ICD-10-CM | POA: Diagnosis not present

## 2019-09-04 DIAGNOSIS — M1990 Unspecified osteoarthritis, unspecified site: Secondary | ICD-10-CM | POA: Diagnosis present

## 2019-09-04 DIAGNOSIS — I5032 Chronic diastolic (congestive) heart failure: Secondary | ICD-10-CM | POA: Diagnosis present

## 2019-09-04 DIAGNOSIS — N179 Acute kidney failure, unspecified: Secondary | ICD-10-CM | POA: Diagnosis present

## 2019-09-04 DIAGNOSIS — A4189 Other specified sepsis: Secondary | ICD-10-CM | POA: Diagnosis present

## 2019-09-04 DIAGNOSIS — Z981 Arthrodesis status: Secondary | ICD-10-CM

## 2019-09-04 DIAGNOSIS — E878 Other disorders of electrolyte and fluid balance, not elsewhere classified: Secondary | ICD-10-CM | POA: Diagnosis present

## 2019-09-04 DIAGNOSIS — Z8711 Personal history of peptic ulcer disease: Secondary | ICD-10-CM

## 2019-09-04 DIAGNOSIS — E785 Hyperlipidemia, unspecified: Secondary | ICD-10-CM | POA: Diagnosis present

## 2019-09-04 DIAGNOSIS — M109 Gout, unspecified: Secondary | ICD-10-CM | POA: Diagnosis present

## 2019-09-04 DIAGNOSIS — E1142 Type 2 diabetes mellitus with diabetic polyneuropathy: Secondary | ICD-10-CM | POA: Diagnosis present

## 2019-09-04 DIAGNOSIS — Z79899 Other long term (current) drug therapy: Secondary | ICD-10-CM

## 2019-09-04 DIAGNOSIS — E861 Hypovolemia: Secondary | ICD-10-CM | POA: Diagnosis present

## 2019-09-04 DIAGNOSIS — K219 Gastro-esophageal reflux disease without esophagitis: Secondary | ICD-10-CM | POA: Diagnosis present

## 2019-09-04 DIAGNOSIS — Z7982 Long term (current) use of aspirin: Secondary | ICD-10-CM

## 2019-09-04 DIAGNOSIS — K5090 Crohn's disease, unspecified, without complications: Secondary | ICD-10-CM | POA: Diagnosis present

## 2019-09-04 DIAGNOSIS — Z87442 Personal history of urinary calculi: Secondary | ICD-10-CM

## 2019-09-04 DIAGNOSIS — Z8261 Family history of arthritis: Secondary | ICD-10-CM

## 2019-09-04 DIAGNOSIS — J1289 Other viral pneumonia: Secondary | ICD-10-CM | POA: Diagnosis present

## 2019-09-04 DIAGNOSIS — R6520 Severe sepsis without septic shock: Secondary | ICD-10-CM | POA: Diagnosis present

## 2019-09-04 DIAGNOSIS — Z794 Long term (current) use of insulin: Secondary | ICD-10-CM

## 2019-09-04 DIAGNOSIS — E860 Dehydration: Secondary | ICD-10-CM | POA: Diagnosis present

## 2019-09-04 DIAGNOSIS — Z7902 Long term (current) use of antithrombotics/antiplatelets: Secondary | ICD-10-CM

## 2019-09-04 DIAGNOSIS — Z888 Allergy status to other drugs, medicaments and biological substances status: Secondary | ICD-10-CM

## 2019-09-04 DIAGNOSIS — Z833 Family history of diabetes mellitus: Secondary | ICD-10-CM

## 2019-09-04 LAB — BASIC METABOLIC PANEL
Anion gap: 17 — ABNORMAL HIGH (ref 5–15)
BUN: 39 mg/dL — ABNORMAL HIGH (ref 8–23)
CO2: 22 mmol/L (ref 22–32)
Calcium: 9.1 mg/dL (ref 8.9–10.3)
Chloride: 94 mmol/L — ABNORMAL LOW (ref 98–111)
Creatinine, Ser: 1.9 mg/dL — ABNORMAL HIGH (ref 0.61–1.24)
GFR calc Af Amer: 39 mL/min — ABNORMAL LOW (ref >60)
GFR calc non Af Amer: 34 mL/min — ABNORMAL LOW (ref >60)
Glucose, Bld: 85 mg/dL (ref 70–99)
Potassium: 3.1 mmol/L — ABNORMAL LOW (ref 3.5–5.1)
Sodium: 133 mmol/L — ABNORMAL LOW (ref 135–145)

## 2019-09-04 LAB — CBC
HCT: 39.8 % (ref 39.0–52.0)
Hemoglobin: 13.7 g/dL (ref 13.0–17.0)
MCH: 33.9 pg (ref 26.0–34.0)
MCHC: 34.4 g/dL (ref 30.0–36.0)
MCV: 98.5 fL (ref 80.0–100.0)
Platelets: 304 10*3/uL (ref 150–400)
RBC: 4.04 MIL/uL — ABNORMAL LOW (ref 4.22–5.81)
RDW: 12.8 % (ref 11.5–15.5)
WBC: 8.6 10*3/uL (ref 4.0–10.5)
nRBC: 0 % (ref 0.0–0.2)

## 2019-09-04 LAB — TROPONIN I (HIGH SENSITIVITY)
Troponin I (High Sensitivity): 24 ng/L — ABNORMAL HIGH (ref <18)
Troponin I (High Sensitivity): 26 ng/L — ABNORMAL HIGH (ref <18)

## 2019-09-04 LAB — POC SARS CORONAVIRUS 2 AG: SARS Coronavirus 2 Ag: POSITIVE — AB

## 2019-09-04 LAB — ABO/RH: ABO/RH(D): A POS

## 2019-09-04 LAB — BRAIN NATRIURETIC PEPTIDE: B Natriuretic Peptide: 227 pg/mL — ABNORMAL HIGH (ref 0.0–100.0)

## 2019-09-04 MED ORDER — PROBENECID 500 MG PO TABS
1000.0000 mg | ORAL_TABLET | Freq: Two times a day (BID) | ORAL | Status: DC
  Administered 2019-09-05 – 2019-09-09 (×9): 1000 mg via ORAL
  Filled 2019-09-04 (×12): qty 2

## 2019-09-04 MED ORDER — CLOPIDOGREL BISULFATE 75 MG PO TABS
75.0000 mg | ORAL_TABLET | Freq: Every day | ORAL | Status: DC
  Administered 2019-09-05 – 2019-09-09 (×5): 75 mg via ORAL
  Filled 2019-09-04 (×6): qty 1

## 2019-09-04 MED ORDER — DICYCLOMINE HCL 10 MG PO CAPS
10.0000 mg | ORAL_CAPSULE | Freq: Three times a day (TID) | ORAL | Status: DC
  Administered 2019-09-05 – 2019-09-09 (×14): 10 mg via ORAL
  Filled 2019-09-04 (×16): qty 1

## 2019-09-04 MED ORDER — CARVEDILOL 25 MG PO TABS
25.0000 mg | ORAL_TABLET | Freq: Two times a day (BID) | ORAL | Status: DC
  Administered 2019-09-05 – 2019-09-07 (×4): 25 mg via ORAL
  Filled 2019-09-04 (×8): qty 1

## 2019-09-04 MED ORDER — LORATADINE 10 MG PO TABS
10.0000 mg | ORAL_TABLET | Freq: Every evening | ORAL | Status: DC
  Administered 2019-09-05 – 2019-09-08 (×4): 10 mg via ORAL
  Filled 2019-09-04 (×4): qty 1

## 2019-09-04 MED ORDER — ACETAMINOPHEN 500 MG PO TABS
ORAL_TABLET | ORAL | Status: AC
  Administered 2019-09-04: 19:00:00 1000 mg via ORAL
  Filled 2019-09-04: qty 2

## 2019-09-04 MED ORDER — SODIUM CHLORIDE 0.9 % IV SOLN: INTRAVENOUS | Status: DC

## 2019-09-04 MED ORDER — MONTELUKAST SODIUM 10 MG PO TABS
10.0000 mg | ORAL_TABLET | Freq: Every day | ORAL | Status: DC
  Administered 2019-09-05 – 2019-09-08 (×4): 10 mg via ORAL
  Filled 2019-09-04 (×4): qty 1

## 2019-09-04 MED ORDER — ASCORBIC ACID 500 MG PO TABS
1000.0000 mg | ORAL_TABLET | Freq: Every day | ORAL | Status: DC
  Administered 2019-09-05 – 2019-09-09 (×5): 1000 mg via ORAL
  Filled 2019-09-04 (×6): qty 2

## 2019-09-04 MED ORDER — MAGNESIUM HYDROXIDE 400 MG/5ML PO SUSP: 30.0000 mL | Freq: Every day | ORAL | Status: DC | PRN

## 2019-09-04 MED ORDER — ENOXAPARIN SODIUM 40 MG/0.4ML ~~LOC~~ SOLN
40.0000 mg | SUBCUTANEOUS | Status: DC
  Administered 2019-09-05 – 2019-09-09 (×5): 40 mg via SUBCUTANEOUS
  Filled 2019-09-04 (×6): qty 0.4

## 2019-09-04 MED ORDER — VITAMIN E 180 MG (400 UNIT) PO CAPS
400.0000 [IU] | ORAL_CAPSULE | Freq: Two times a day (BID) | ORAL | Status: DC
  Administered 2019-09-05 – 2019-09-09 (×9): 400 [IU] via ORAL
  Filled 2019-09-04 (×12): qty 1

## 2019-09-04 MED ORDER — AMLODIPINE BESYLATE 5 MG PO TABS
5.0000 mg | ORAL_TABLET | Freq: Two times a day (BID) | ORAL | Status: DC
  Administered 2019-09-05 – 2019-09-09 (×6): 5 mg via ORAL
  Filled 2019-09-04 (×7): qty 1

## 2019-09-04 MED ORDER — ADULT MULTIVITAMIN W/MINERALS CH
1.0000 | ORAL_TABLET | Freq: Every day | ORAL | Status: DC
  Administered 2019-09-05 – 2019-09-09 (×5): 1 via ORAL
  Filled 2019-09-04 (×5): qty 1

## 2019-09-04 MED ORDER — DEXAMETHASONE SODIUM PHOSPHATE 10 MG/ML IJ SOLN
10.0000 mg | Freq: Once | INTRAMUSCULAR | Status: AC
  Administered 2019-09-04: 20:00:00 10 mg via INTRAVENOUS
  Filled 2019-09-04: qty 1

## 2019-09-04 MED ORDER — ONDANSETRON HCL 4 MG PO TABS
4.0000 mg | ORAL_TABLET | Freq: Four times a day (QID) | ORAL | Status: DC | PRN
  Filled 2019-09-04: qty 1

## 2019-09-04 MED ORDER — OMEGA-3-ACID ETHYL ESTERS 1 G PO CAPS
2.0000 g | ORAL_CAPSULE | Freq: Two times a day (BID) | ORAL | Status: DC
  Administered 2019-09-05 – 2019-09-09 (×9): 2 g via ORAL
  Filled 2019-09-04 (×10): qty 2

## 2019-09-04 MED ORDER — SODIUM CHLORIDE 0.9 % IV SOLN
500.0000 mg | INTRAVENOUS | Status: DC
  Administered 2019-09-05: 04:00:00 500 mg via INTRAVENOUS
  Filled 2019-09-04 (×3): qty 500

## 2019-09-04 MED ORDER — ZINC SULFATE 220 (50 ZN) MG PO CAPS
220.0000 mg | ORAL_CAPSULE | Freq: Every day | ORAL | Status: DC
  Administered 2019-09-05 – 2019-09-09 (×5): 220 mg via ORAL
  Filled 2019-09-04 (×5): qty 1

## 2019-09-04 MED ORDER — LACTATED RINGERS IV BOLUS
1000.0000 mL | Freq: Once | INTRAVENOUS | Status: AC
  Administered 2019-09-04: 20:00:00 1000 mL via INTRAVENOUS

## 2019-09-04 MED ORDER — NITROGLYCERIN 0.4 MG SL SUBL: 0.4000 mg | SUBLINGUAL_TABLET | SUBLINGUAL | Status: DC | PRN

## 2019-09-04 MED ORDER — COLCHICINE 0.6 MG PO TABS
0.6000 mg | ORAL_TABLET | Freq: Every day | ORAL | Status: DC
  Administered 2019-09-05 – 2019-09-09 (×5): 0.6 mg via ORAL
  Filled 2019-09-04 (×6): qty 1

## 2019-09-04 MED ORDER — ONDANSETRON HCL 4 MG/2ML IJ SOLN: 4.0000 mg | Freq: Four times a day (QID) | INTRAMUSCULAR | Status: DC | PRN

## 2019-09-04 MED ORDER — SPIRONOLACTONE 25 MG PO TABS
12.5000 mg | ORAL_TABLET | Freq: Every day | ORAL | Status: DC
  Administered 2019-09-05 – 2019-09-09 (×4): 12.5 mg via ORAL
  Filled 2019-09-04: qty 1
  Filled 2019-09-04: qty 0.5
  Filled 2019-09-04: qty 1
  Filled 2019-09-04 (×2): qty 0.5
  Filled 2019-09-04 (×3): qty 1
  Filled 2019-09-04: qty 0.5
  Filled 2019-09-04: qty 1
  Filled 2019-09-04: qty 0.5

## 2019-09-04 MED ORDER — SODIUM CHLORIDE 0.9 % IV SOLN
100.0000 mg | Freq: Every day | INTRAVENOUS | Status: AC
  Administered 2019-09-05 – 2019-09-08 (×4): 100 mg via INTRAVENOUS
  Filled 2019-09-04 (×4): qty 100

## 2019-09-04 MED ORDER — INSULIN DEGLUDEC 200 UNIT/ML ~~LOC~~ SOPN: 90.0000 [IU] | PEN_INJECTOR | Freq: Every day | SUBCUTANEOUS | Status: DC

## 2019-09-04 MED ORDER — ASCORBIC ACID 500 MG PO TABS: 500.0000 mg | ORAL_TABLET | Freq: Every day | ORAL | Status: DC

## 2019-09-04 MED ORDER — VITAMIN D 25 MCG (1000 UNIT) PO TABS
1000.0000 [IU] | ORAL_TABLET | Freq: Every day | ORAL | Status: DC
  Administered 2019-09-05 – 2019-09-09 (×5): 1000 [IU] via ORAL
  Filled 2019-09-04 (×6): qty 1

## 2019-09-04 MED ORDER — SODIUM CHLORIDE 0.9% FLUSH: 3.0000 mL | Freq: Once | INTRAVENOUS | Status: DC

## 2019-09-04 MED ORDER — VITAMIN D (ERGOCALCIFEROL) 1.25 MG (50000 UNIT) PO CAPS: 50000.0000 [IU] | ORAL_CAPSULE | ORAL | Status: DC

## 2019-09-04 MED ORDER — ISOSORBIDE MONONITRATE ER 30 MG PO TB24
120.0000 mg | ORAL_TABLET | Freq: Every day | ORAL | Status: DC
  Administered 2019-09-05 – 2019-09-09 (×5): 120 mg via ORAL
  Filled 2019-09-04 (×2): qty 4
  Filled 2019-09-04: qty 2
  Filled 2019-09-04 (×3): qty 4

## 2019-09-04 MED ORDER — FAMOTIDINE 20 MG PO TABS
20.0000 mg | ORAL_TABLET | Freq: Two times a day (BID) | ORAL | Status: DC
  Administered 2019-09-05 – 2019-09-09 (×9): 20 mg via ORAL
  Filled 2019-09-04 (×9): qty 1

## 2019-09-04 MED ORDER — INSULIN ASPART 100 UNIT/ML ~~LOC~~ SOLN
0.0000 [IU] | SUBCUTANEOUS | Status: DC
  Administered 2019-09-05: 13:00:00 20 [IU] via SUBCUTANEOUS
  Administered 2019-09-05 (×2): 11 [IU] via SUBCUTANEOUS
  Administered 2019-09-05: 16:00:00 15 [IU] via SUBCUTANEOUS
  Administered 2019-09-06: 4 [IU] via SUBCUTANEOUS
  Administered 2019-09-06: 3 [IU] via SUBCUTANEOUS
  Administered 2019-09-06: 13:00:00 11 [IU] via SUBCUTANEOUS
  Administered 2019-09-06: 17:00:00 7 [IU] via SUBCUTANEOUS
  Administered 2019-09-06 (×2): 4 [IU] via SUBCUTANEOUS
  Administered 2019-09-07: 15 [IU] via SUBCUTANEOUS
  Administered 2019-09-07: 02:00:00 3 [IU] via SUBCUTANEOUS
  Administered 2019-09-07 – 2019-09-08 (×5): 7 [IU] via SUBCUTANEOUS
  Administered 2019-09-08: 4 [IU] via SUBCUTANEOUS
  Administered 2019-09-08: 7 [IU] via SUBCUTANEOUS
  Administered 2019-09-08: 11 [IU] via SUBCUTANEOUS
  Administered 2019-09-08: 23:00:00 7 [IU] via SUBCUTANEOUS
  Administered 2019-09-08 – 2019-09-09 (×3): 4 [IU] via SUBCUTANEOUS
  Administered 2019-09-09: 08:00:00 7 [IU] via SUBCUTANEOUS
  Administered 2019-09-09: 4 [IU] via SUBCUTANEOUS
  Administered 2019-09-09: 7 [IU] via SUBCUTANEOUS
  Filled 2019-09-04 (×27): qty 1

## 2019-09-04 MED ORDER — POTASSIUM CHLORIDE 20 MEQ PO PACK
40.0000 meq | PACK | Freq: Once | ORAL | Status: AC
  Administered 2019-09-05: 11:00:00 40 meq via ORAL

## 2019-09-04 MED ORDER — GABAPENTIN 300 MG PO CAPS
600.0000 mg | ORAL_CAPSULE | Freq: Three times a day (TID) | ORAL | Status: DC
  Administered 2019-09-05 – 2019-09-09 (×13): 600 mg via ORAL
  Filled 2019-09-04 (×14): qty 2

## 2019-09-04 MED ORDER — TRAZODONE HCL 50 MG PO TABS
25.0000 mg | ORAL_TABLET | Freq: Every evening | ORAL | Status: DC | PRN
  Administered 2019-09-08: 22:00:00 25 mg via ORAL
  Filled 2019-09-04: qty 1
  Filled 2019-09-04: qty 0.5

## 2019-09-04 MED ORDER — DEXAMETHASONE SODIUM PHOSPHATE 10 MG/ML IJ SOLN
6.0000 mg | INTRAMUSCULAR | Status: DC
  Administered 2019-09-05 – 2019-09-08 (×5): 6 mg via INTRAVENOUS
  Filled 2019-09-04 (×5): qty 0.6
  Filled 2019-09-04: qty 1
  Filled 2019-09-04: qty 0.6

## 2019-09-04 MED ORDER — HYDRALAZINE HCL 50 MG PO TABS
100.0000 mg | ORAL_TABLET | Freq: Three times a day (TID) | ORAL | Status: DC
  Administered 2019-09-05 – 2019-09-09 (×10): 100 mg via ORAL
  Filled 2019-09-04 (×11): qty 2

## 2019-09-04 MED ORDER — HYDROCOD POLST-CPM POLST ER 10-8 MG/5ML PO SUER: 5.0000 mL | Freq: Two times a day (BID) | ORAL | Status: DC | PRN

## 2019-09-04 MED ORDER — SODIUM CHLORIDE 0.9 % IV SOLN
2.0000 g | INTRAVENOUS | Status: DC
  Administered 2019-09-05: 04:00:00 2 g via INTRAVENOUS
  Filled 2019-09-04 (×2): qty 20

## 2019-09-04 MED ORDER — ASPIRIN EC 81 MG PO TBEC
81.0000 mg | DELAYED_RELEASE_TABLET | Freq: Every day | ORAL | Status: DC
  Administered 2019-09-05 – 2019-09-09 (×5): 81 mg via ORAL
  Filled 2019-09-04 (×6): qty 1

## 2019-09-04 MED ORDER — CLONIDINE HCL 0.1 MG PO TABS
0.2000 mg | ORAL_TABLET | Freq: Two times a day (BID) | ORAL | Status: DC
  Administered 2019-09-05 – 2019-09-08 (×5): 0.2 mg via ORAL
  Filled 2019-09-04 (×7): qty 2

## 2019-09-04 MED ORDER — INSULIN DETEMIR 100 UNIT/ML ~~LOC~~ SOLN
0.1500 [IU]/kg | Freq: Two times a day (BID) | SUBCUTANEOUS | Status: DC
  Administered 2019-09-05 – 2019-09-09 (×9): 13 [IU] via SUBCUTANEOUS
  Filled 2019-09-04 (×12): qty 0.13

## 2019-09-04 MED ORDER — SODIUM CHLORIDE 0.9 % IV SOLN
200.0000 mg | Freq: Once | INTRAVENOUS | Status: AC
  Administered 2019-09-05: 04:00:00 200 mg via INTRAVENOUS
  Filled 2019-09-04: qty 200

## 2019-09-04 MED ORDER — FLUTICASONE PROPIONATE 50 MCG/ACT NA SUSP
2.0000 | Freq: Every day | NASAL | Status: DC
  Administered 2019-09-05 – 2019-09-09 (×5): 2 via NASAL
  Filled 2019-09-04: qty 16

## 2019-09-04 MED ORDER — OLOPATADINE HCL 0.6 % NA SOLN: 2.0000 | Freq: Two times a day (BID) | NASAL | Status: DC

## 2019-09-04 MED ORDER — PANTOPRAZOLE SODIUM 40 MG PO TBEC
40.0000 mg | DELAYED_RELEASE_TABLET | Freq: Every day | ORAL | Status: DC
  Administered 2019-09-05 – 2019-09-09 (×5): 40 mg via ORAL
  Filled 2019-09-04 (×5): qty 1

## 2019-09-04 MED ORDER — FUROSEMIDE 40 MG PO TABS
80.0000 mg | ORAL_TABLET | ORAL | Status: DC
  Administered 2019-09-05 – 2019-09-06 (×2): 80 mg via ORAL
  Filled 2019-09-04 (×3): qty 2

## 2019-09-04 MED ORDER — ACETAMINOPHEN 500 MG PO TABS: 1000.0000 mg | ORAL_TABLET | Freq: Once | ORAL | Status: AC

## 2019-09-04 MED ORDER — IPRATROPIUM BROMIDE 0.06 % NA SOLN
2.0000 | Freq: Four times a day (QID) | NASAL | Status: DC
  Administered 2019-09-05 (×2): 2 via NASAL
  Filled 2019-09-04: qty 15

## 2019-09-04 MED ORDER — PRAVASTATIN SODIUM 20 MG PO TABS
40.0000 mg | ORAL_TABLET | Freq: Every day | ORAL | Status: DC
  Administered 2019-09-05 – 2019-09-06 (×2): 40 mg via ORAL
  Filled 2019-09-04 (×2): qty 2

## 2019-09-04 MED ORDER — ACETAMINOPHEN 325 MG PO TABS: 650.0000 mg | ORAL_TABLET | Freq: Four times a day (QID) | ORAL | Status: DC | PRN

## 2019-09-04 NOTE — ED Triage Notes (Signed)
First Nurse Note:  Arrives with wife with c/o worsening SOB since Friday.  Patient is AAOx3.  Skin warm and dry.  No SOB/ DOE.  NAD

## 2019-09-04 NOTE — ED Notes (Signed)
Pt's wife Khyan Oats) updated on pt's plan of care.

## 2019-09-04 NOTE — ED Provider Notes (Signed)
[facility] Emergency Department Provider Note   ____________________________________________   First MD Initiated Contact with Patient 09/04/19 1833     (approximate)  I have reviewed the triage vital signs and the nursing notes.   HISTORY  Chief Complaint Shortness of Breath    HPI Chase Cabrera is a 73 y.o. male with past medical history of CAD, atrial fibrillation, CHF, Crohn's disease, and diabetes presents to the ED complaining of shortness of breath.  Patient reports he has developed generalized weakness, cough, and shortness of breath about 4 days ago.  He began to notice fevers 2 days ago as well as nonbloody diarrhea.  Shortness of breath has continued to worsen, but he denies any associated chest pain.  He is concerned about COVID-19 because he lives with a grandson who recently tested positive.  He has not yet been tested for COVID-19.        Past Medical History:  Diagnosis Date  . Arthritis   . Atrial fibrillation ([place])   . CAD (coronary artery disease)    6 STENTS  . CHF (congestive heart failure) ([place])   . Chicken pox   . Colon polyp   . Corneal dystrophy    bilateral  . Crohn's disease ([place])   . Diabetes ([place]) 2002  . Dysrhythmia    A-FIB AND PAF  . GERD (gastroesophageal reflux disease)   . Gout   . History of kidney stones   . History of shingles   . Hyperlipemia   . Hypertension    2/2 b/l RAS >60% [place] 06/2018 s/p right stent   . Kidney stones   . Myocardial infarction ([place])    x4, last one in 2010  . Peripheral neuropathy   . Peripheral neuropathy   . Peripheral neuropathy   . PUD (peptic ulcer disease)   . Rectus sheath hematoma    02/18/18 8.9 x 4.5 cm then 02/19/18 7.9 x 4.5 cm   . Renal artery stenosis ([place])   . Renal artery stenosis ([place])   . Salzmann's nodular dystrophy 2012  . Sleep apnea   . Spinal stenosis     Patient Active Problem List   Diagnosis Date Noted  . Chronic diastolic CHF (congestive  heart failure) ([place]) 07/05/2019  . Dementia ([place]) 02/15/2019  . BPH (benign prostatic hyperplasia) 02/15/2019  . STEMI (ST elevation myocardial infarction) ([place]) 09/16/2018  . Acute ST elevation myocardial infarction (STEMI) of lateral wall ([place]) 09/16/2018  . Diarrhea 08/09/2018  . Allergic rhinitis 08/08/2018  . Bronchiectasis without complication ([place]) 26/71/2458  . TIA (transient ischemic attack) 04/30/2018  . Abnormal thyroid function test 04/03/2018  . Leukocytosis 04/03/2018  . Hematoma of rectus sheath 02/21/2018  . Carotid artery stenosis 02/16/2018  . Altered mental status 02/09/2018  . Hypokalemia 01/20/2018  . OSA (obstructive sleep apnea) 01/12/2018  . Osteoarthritis of right hip 12/13/2017  . Intraabdominal mass 11/23/2017  . Colonic mass 11/18/2017  . Crohn's disease ([place]) 10/20/2017  . Pulmonary nodule 10/07/2017  . History of skin cancer 10/07/2017  . Rib pain on right side 03/07/2017  . Postnasal drip 09/09/2016  . Osteoarthritis 04/08/2016  . Spinal stenosis, lumbar 03/25/2016  . DM type 2 with diabetic peripheral neuropathy ([place]) 07/15/2014  . Gout 05/04/2013  . Renal artery stenosis ([place]) 07/01/2011  . Atrial fibrillation ([place]) 06/30/2011  . CAD (coronary artery disease) 06/30/2011  . Hyperlipidemia 06/30/2011  . Essential hypertension 06/30/2011    Past Surgical History:  Procedure Laterality Date  .  ABLATION  2012  . APPLICATION VERTERBRAL DEFECT PROSTHETIC  05/01/2013  . ARTHRODESIS ANTERIOR LUMBAR SPINE  05/01/2013  . BACK SURGERY     lumbar fusion  . CARDIAC CATHETERIZATION    . CARDIAC ELECTROPHYSIOLOGY STUDY AND ABLATION    . CARDIAC SURGERY    . CARDIOVERSION    . CHOLECYSTECTOMY    . COLONOSCOPY WITH PROPOFOL N/A 04/09/2016   Completed for chronic diarrhea.  Few scattered diverticuli.  No mucosal lesions.  Surgeon: Lollie Sails, MD;  Location: [facility] ENDOSCOPY;  Service: Endoscopy;  Laterality: N/A;  . CORNEAL EYE SURGERY Bilateral  07/28/11    09/22/2011  . CORONARY ANGIOPLASTY    . CORONARY ANGIOPLASTY WITH STENT PLACEMENT  03/28/2015   Distal 80% to normal Stent, Dilation Balloon  . CORONARY ARTERY BYPASS GRAFT     4 VESSELS  . CORONARY/GRAFT ACUTE MI REVASCULARIZATION N/A 09/16/2018   Procedure: Coronary/Graft Acute MI Revascularization;  Surgeon: Isaias Cowman, MD;  Location: [facility];  Service: Cardiovascular;  Laterality: N/A;  . EYE SURGERY     bilateral cataract 2017 Dr. Megan Mans  . foot and ankle repair Right 2005  . FRACTURE SURGERY     ANKLE PLATE AND SCREWS  . GALLBLADER    . JOINT REPLACEMENT     left total hip 11/11/15  . JOINT REPLACEMENT     right total hip 12/2017 Dr. Rudene Christians   . KNEE ARTHROSCOPY Right   . LEFT HEART CATH AND CORONARY ANGIOGRAPHY N/A 09/16/2018   Procedure: LEFT HEART CATH AND CORONARY ANGIOGRAPHY;  Surgeon: Isaias Cowman, MD;  Location: [facility];  Service: Cardiovascular;  Laterality: N/A;  . LUMBAR SPINE FUSION ONE LEVEL  05/03/2013  . RENAL ARTERY STENT Right   . ROTATOR CUFF REPAIR Right 2006  . TONSILLECTOMY    . TOTAL HIP ARTHROPLASTY Left 11/11/2015   Procedure: TOTAL HIP ARTHROPLASTY ANTERIOR APPROACH;  Surgeon: Hessie Knows, MD;  Location: [facility] ORS;  Service: Orthopedics;  Laterality: Left;  . TOTAL HIP ARTHROPLASTY Right 12/13/2017   Procedure: TOTAL HIP ARTHROPLASTY ANTERIOR APPROACH;  Surgeon: Hessie Knows, MD;  Location: [facility] ORS;  Service: Orthopedics;  Laterality: Right;  . TRANSCATH PLACEMENT INTRAVASCULAR STENT LEG  03/2015    Prior to Admission medications   Medication Sig Start Date End Date Taking? Authorizing Provider  amitriptyline (ELAVIL) 50 MG tablet Take 0.5 tablets (25 mg total) by mouth at bedtime as needed. 02/19/19  Yes McLean-Scocuzza, Nino Glow, MD  amLODipine (NORVASC) 5 MG tablet Take 1 tablet (5 mg total) by mouth 2 (two) times daily. If blood pressure >140/>90 make take another 1 pill of 5 mg 05/01/18  Yes Mayo, Pete Pelt, MD  aspirin EC 81 MG EC tablet Take 1 tablet (81 mg total) by mouth daily. 05/02/18  Yes Mayo, Pete Pelt, MD  carvedilol (COREG) 25 MG tablet Take 1 tablet (25 mg total) by mouth 2 (two) times daily with a meal. 03/27/19  Yes McLean-Scocuzza, Nino Glow, MD  Cholecalciferol 1.25 MG (50000 UT) TABS Take 1 tablet by mouth once a week. 07/12/19  Yes McLean-Scocuzza, Nino Glow, MD  cloNIDine (CATAPRES) 0.2 MG tablet Take 1 tablet (0.2 mg total) by mouth 2 (two) times daily. 07/11/19  Yes McLean-Scocuzza, Nino Glow, MD  clopidogrel (PLAVIX) 75 MG tablet Take 75 mg by mouth daily.   Yes [provider]  colchicine 0.6 MG tablet TAKE 1 TABLET BY MOUTH ONCE DAILY Patient taking differently: Take 0.6 mg by mouth daily.  07/12/17  Yes Leone Haven, MD  diclofenac sodium (VOLTAREN) 1 % GEL Apply 2 g topically 4 (four) times daily as needed (for pain).    Yes [provider]  dicyclomine (BENTYL) 10 MG capsule Take 10 mg by mouth 3 (three) times daily before meals.    Yes [provider]  fluticasone (FLONASE) 50 MCG/ACT nasal spray Place 2 sprays into both nostrils daily. 05/10/19  Yes McLean-Scocuzza, Nino Glow, MD  furosemide (LASIX) 40 MG tablet Take 80 mg by mouth every other day.  09/22/18 09/22/19 Yes [provider]  gabapentin (NEURONTIN) 300 MG capsule take 2 capsules (654m) by mouth three times a day 01/16/18  Yes McLean-Scocuzza, TNino Glow MD  hydrALAZINE (APRESOLINE) 100 MG tablet TAKE 100 MG BY MOUTH THREE TIMES A DAY 01/02/19  Yes McLean-Scocuzza, TNino Glow MD  insulin lispro (HUMALOG) 100 UNIT/ML KiwkPen Inject 14-32 Units into the skin 3 (three) times daily. (according to sliding scale - max 96u over 24 hours)   Yes [provider]  ipratropium (ATROVENT) 0.06 % nasal spray Place 2 sprays into both nostrils 4 (four) times daily. 02/09/18  Yes McLean-Scocuzza, TNino Glow MD  isosorbide mononitrate (IMDUR) 120 MG 24 hr tablet TAKE 1 TABLET BY MOUTH EVERY  DAY Patient taking differently: Take 120 mg by mouth daily.  10/07/17  Yes McLean-Scocuzza, TNino Glow MD  levocetirizine (XYZAL) 5 MG tablet Take 1 tablet (5 mg total) by mouth every evening. 02/09/18  Yes McLean-Scocuzza, TNino Glow MD  montelukast (SINGULAIR) 10 MG tablet Take 1 tablet (10 mg total) by mouth at bedtime. 07/03/19  Yes McLean-Scocuzza, TNino Glow MD  Multiple Vitamin (MULTI-VITAMINS) TABS Take 1 tablet by mouth daily. 04/15/09  Yes [provider]  Olopatadine HCl 0.6 % SOLN Place 2 sprays into the nose 2 (two) times daily. 06/11/19  Yes GTyler Pita MD  omega-3 acid ethyl esters (LOVAZA) 1 g capsule Take 2 capsules (2 g total) by mouth 2 (two) times daily. 05/12/18  Yes McLean-Scocuzza, TNino Glow MD  omeprazole (PRILOSEC) 40 MG capsule Take 40 mg by mouth 2 (two) times daily.    Yes [provider]  pravastatin (PRAVACHOL) 40 MG tablet Take 1 tablet (40 mg total) by mouth daily. 03/28/19  Yes McLean-Scocuzza, TNino Glow MD  probenecid (BENEMID) 500 MG tablet Take 1,000 mg by mouth 2 (two) times daily. 09/03/19  Yes [provider]  sodium chloride HYPERTONIC 3 % nebulizer solution Take by nebulization 2 (two) times daily. 06/14/18  Yes MJuanito Doom MD  spironolactone (ALDACTONE) 25 MG tablet Take 12.5 mg by mouth daily.  09/23/18 09/23/19 Yes [provider]  TRESIBA FLEXTOUCH 200 UNIT/ML SOPN Inject 90 Units into the skin at bedtime. 04/25/18  Yes [provider]  vitamin C (ASCORBIC ACID) 500 MG tablet Take 500 mg by mouth daily.    Yes [provider]  Vitamin E 400 units TABS Take 400 Units by mouth 2 (two) times daily.   Yes [provider]  nitroGLYCERIN (NITROSTAT) 0.4 MG SL tablet Place 1 tablet (0.4 mg total) under the tongue every 5 (five) minutes x 3 doses as needed. For chest pain.  Call 911 if no relief after 3 tablets 10/03/17 11/02/21  McLean-Scocuzza, TNino Glow MD    Allergies Allopurinol, Atenolol,  Atorvastatin, Ramipril, Rosuvastatin, Simvastatin, Valsartan, and Cardizem [diltiazem]  Family History  Problem Relation Age of Onset  . CVA Father   . Stroke Father   . Lung cancer Mother   .  Arthritis Mother   . Stomach cancer Sister   . Colon cancer Sister   . Diabetes Brother     Social History Social History   Tobacco Use  . Smoking status: Former Smoker    Packs/day: 1.00    Years: 3.00    Pack years: 3.00    Types: Cigarettes    Quit date: 06/30/1962    Years since quitting: 57.2  . Smokeless tobacco: Former Systems developer    Types: Chew    Quit date: 12/05/1968  Substance Use Topics  . Alcohol use: No  . Drug use: No    Review of Systems  Constitutional: Positive for fever/chills Eyes: No visual changes. ENT: No sore throat. Cardiovascular: Denies chest pain. Respiratory: Positive for cough and shortness of breath. Gastrointestinal: No abdominal pain.  No nausea, no vomiting.  Positive for diarrhea.  No constipation. Genitourinary: Negative for dysuria. Musculoskeletal: Negative for back pain. Skin: Negative for rash. Neurological: Negative for headaches, focal weakness or numbness.  ____________________________________________   PHYSICAL EXAM:  VITAL SIGNS: ED Triage Vitals  Enc Vitals Group     BP 09/04/19 1817 (!) 112/55     Pulse Rate 09/04/19 1748 63     Resp 09/04/19 1748 16     Temp 09/04/19 1817 (!) 102 F (38.9 C)     Temp Source 09/04/19 1817 Oral     SpO2 09/04/19 1748 92 %     Weight 09/04/19 1822 192 lb (87.1 kg)     Height 09/04/19 1822 5' 8"  (1.727 m)     Head Circumference --      Peak Flow --      Pain Score 09/04/19 1822 0     Pain Loc --      Pain Edu? --      Excl. in [place]? --     Constitutional: Alert and oriented. Eyes: Conjunctivae are normal. Head: Atraumatic. Nose: No congestion/rhinnorhea. Mouth/Throat: Mucous membranes are moist. Neck: Normal ROM Cardiovascular: Normal rate, regular rhythm. Grossly normal heart  sounds. Respiratory: Tachypneic with increased respiratory effort.  No retractions. Lungs CTAB. Gastrointestinal: Soft and nontender. No distention. Genitourinary: deferred Musculoskeletal: No lower extremity tenderness nor edema. Neurologic:  Normal speech and language. No gross focal neurologic deficits are appreciated. Skin:  Skin is warm, dry and intact. No rash noted. Psychiatric: Mood and affect are normal. Speech and behavior are normal.  ____________________________________________   LABS (all labs ordered are listed, but only abnormal results are displayed)  Labs Reviewed  BASIC METABOLIC PANEL - Abnormal; Notable for the following components:      Result Value   Sodium 133 (*)    Potassium 3.1 (*)    Chloride 94 (*)    BUN 39 (*)    Creatinine, Ser 1.90 (*)    GFR calc non Af Amer 34 (*)    GFR calc Af Amer 39 (*)    Anion gap 17 (*)    All other components within normal limits  CBC - Abnormal; Notable for the following components:   RBC 4.04 (*)    All other components within normal limits  BRAIN NATRIURETIC PEPTIDE - Abnormal; Notable for the following components:   B Natriuretic Peptide 227.0 (*)    All other components within normal limits  POC SARS CORONAVIRUS 2 AG - Abnormal; Notable for the following components:   SARS Coronavirus 2 Ag POSITIVE (*)    All other components within normal limits  TROPONIN I (HIGH SENSITIVITY) - Abnormal; Notable for  the following components:   Troponin I (High Sensitivity) 24 (*)    All other components within normal limits  POC SARS CORONAVIRUS 2 AG -  ED  TROPONIN I (HIGH SENSITIVITY)   ____________________________________________  EKG  ED ECG REPORT I, Blake Divine, the attending physician, personally viewed and interpreted this ECG.   Date: 09/04/2019  EKG Time: 18:21  Rate: 64  Rhythm: normal sinus rhythm  Axis: LAD  Intervals:nonspecific intraventricular conduction delay  ST&T Change: T wave inversions  anteriorly  ED ECG REPORT I, Blake Divine, the attending physician, personally viewed and interpreted this ECG.   Date: 09/04/2019  EKG Time: 18:35  Rate: 63  Rhythm: normal sinus rhythm  Axis: LAD  Intervals:nonspecific intraventricular conduction delay  ST&T Change: Anterior T wave inversions    PROCEDURES  Procedure(s) performed (including Critical Care):  Procedures   ____________________________________________   INITIAL IMPRESSION / ASSESSMENT AND PLAN / ED COURSE       73 year old male presents to the ED with fevers, cough, shortness of breath, and diarrhea developing over the past 4 days.  He is not in significant respiratory distress but noted to be tachypneic with O2 sats of less than 90% on room air, subsequently placed on 2 L nasal cannula with improvement.  I would have a very high suspicion for COVID-19 in this patient given his symptoms and recent exposure, will perform rapid testing.  Concern for ACS on initial EKG, however repeat does not show any significant ST changes.  He denies any chest pain and ACS seems less likely, troponin pending.  Will check chest x-ray, anticipate admission.  COVID-19 testing is positive, given his hypoxia will treat with steroids.  Chest x-ray read as possible pulmonary edema however findings could also represent viral pneumonia.  Patient reports recent diarrhea and blood pressure is trending low, he overall appears hypovolemic rather than hypervolemic.  Less suspicious for CHF exacerbation given BNP at baseline.  Patient was hydrated with IV fluids with subsequent improvement in his blood pressure.  Case discussed with hospitalist, who accepts patient for admission.      ____________________________________________   FINAL CLINICAL IMPRESSION(S) / ED DIAGNOSES  Final diagnoses:  Pneumonia due to COVID-19 virus  Dehydration  Acute respiratory failure with hypoxia [facility])     ED Discharge Orders    None       Note:   This document was prepared using Dragon voice recognition software and may include unintentional dictation errors.   Blake Divine, MD 09/04/19 2048

## 2019-09-04 NOTE — H&P (Addendum)
[facility] NAME: Chase Cabrera    MR#:  [id number]  DATE OF BIRTH:  14-Jul-1946  DATE OF ADMISSION:  09/04/2019  PRIMARY CARE PHYSICIAN: McLean-Scocuzza, Nino Glow, MD   REQUESTING/REFERRING PHYSICIAN: Blake Divine, MD  CHIEF COMPLAINT:   Chief Complaint  Patient presents with  . Shortness of Breath    HISTORY OF PRESENT ILLNESS:  Chase Cabrera  is a 73 y.o. Caucasian male with a known history of CHF, coronary artery disease, Crohn's disease, type 2 diabetes mellitus, GERD, hypertension, gout and dyslipidemia, presented to the emergency room with acute onset of  Upon presentation to the emergency room, temperature was 102 and respiratory rate was 16 and later was up to 44 and later down 25 with pulse oximetry of 92 and later 90% on room air that went up to 94% on 2 L O2 by nasal cannula.  Blood pressure dropped briefly to 81/48 and came up with hydration to 105/57.  Labs revealed mild hypokalemia of 3.1 and hyponatremia 133, hypochloremia of 94 anion gap of 17, BUN of 39 and creatinine 1.9 compared to 1.6 on 08/27/2019.  BUN and creatinine were 19 and 1.04 on 06/24/2019.  High-sensitivity troponin I came back 24 and later 26 and BNP 227.  CBC was unremarkable.  COVID-19 antigen came back positive.  EKG showed normal sinus rhythm with rate of 63 with intraventricular conduction delay and possible right bundle branch block as well as Q waves inferiorly.  Portable chest ray showed cardiomegaly with pulmonary vascular congestion.  Per my reading it is concerning for multifocal pneumonia.  The patient was given 1 g of p.o. Tylenol and 10 mg of IV Decadron as well as 1 L IV lactated ringer.  PAST MEDICAL HISTORY:   Past Medical History:  Diagnosis Date  . Arthritis   . Atrial fibrillation ([place])   . CAD (coronary artery disease)    6 STENTS  . CHF (congestive heart failure) ([place])   . Chicken pox   . Colon polyp   . Corneal dystrophy    bilateral   . Crohn's disease ([place])   . Diabetes ([place]) 2002  . Dysrhythmia    A-FIB AND PAF  . GERD (gastroesophageal reflux disease)   . Gout   . History of kidney stones   . History of shingles   . Hyperlipemia   . Hypertension    2/2 b/l RAS >60% [place] 06/2018 s/p right stent   . Kidney stones   . Myocardial infarction ([place])    x4, last one in 2010  . Peripheral neuropathy   . Peripheral neuropathy   . Peripheral neuropathy   . PUD (peptic ulcer disease)   . Rectus sheath hematoma    02/18/18 8.9 x 4.5 cm then 02/19/18 7.9 x 4.5 cm   . Renal artery stenosis ([place])   . Renal artery stenosis ([place])   . Salzmann's nodular dystrophy 2012  . Sleep apnea   . Spinal stenosis     PAST SURGICAL HISTORY:   Past Surgical History:  Procedure Laterality Date  . ABLATION  2012  . APPLICATION VERTERBRAL DEFECT PROSTHETIC  05/01/2013  . ARTHRODESIS ANTERIOR LUMBAR SPINE  05/01/2013  . BACK SURGERY     lumbar fusion  . CARDIAC CATHETERIZATION    . CARDIAC ELECTROPHYSIOLOGY STUDY AND ABLATION    . CARDIAC SURGERY    . CARDIOVERSION    . CHOLECYSTECTOMY    . COLONOSCOPY WITH PROPOFOL N/A 04/09/2016  Completed for chronic diarrhea.  Few scattered diverticuli.  No mucosal lesions.  Surgeon: Lollie Sails, MD;  Location: [facility] ENDOSCOPY;  Service: Endoscopy;  Laterality: N/A;  . CORNEAL EYE SURGERY Bilateral 07/28/11    09/22/2011  . CORONARY ANGIOPLASTY    . CORONARY ANGIOPLASTY WITH STENT PLACEMENT  03/28/2015   Distal 80% to normal Stent, Dilation Balloon  . CORONARY ARTERY BYPASS GRAFT     4 VESSELS  . CORONARY/GRAFT ACUTE MI REVASCULARIZATION N/A 09/16/2018   Procedure: Coronary/Graft Acute MI Revascularization;  Surgeon: Isaias Cowman, MD;  Location: [facility];  Service: Cardiovascular;  Laterality: N/A;  . EYE SURGERY     bilateral cataract 2017 Dr. Megan Mans  . foot and ankle repair Right 2005  . FRACTURE SURGERY     ANKLE PLATE AND SCREWS  . GALLBLADER    . JOINT  REPLACEMENT     left total hip 11/11/15  . JOINT REPLACEMENT     right total hip 12/2017 Dr. Rudene Christians   . KNEE ARTHROSCOPY Right   . LEFT HEART CATH AND CORONARY ANGIOGRAPHY N/A 09/16/2018   Procedure: LEFT HEART CATH AND CORONARY ANGIOGRAPHY;  Surgeon: Isaias Cowman, MD;  Location: [facility];  Service: Cardiovascular;  Laterality: N/A;  . LUMBAR SPINE FUSION ONE LEVEL  05/03/2013  . RENAL ARTERY STENT Right   . ROTATOR CUFF REPAIR Right 2006  . TONSILLECTOMY    . TOTAL HIP ARTHROPLASTY Left 11/11/2015   Procedure: TOTAL HIP ARTHROPLASTY ANTERIOR APPROACH;  Surgeon: Hessie Knows, MD;  Location: [facility] ORS;  Service: Orthopedics;  Laterality: Left;  . TOTAL HIP ARTHROPLASTY Right 12/13/2017   Procedure: TOTAL HIP ARTHROPLASTY ANTERIOR APPROACH;  Surgeon: Hessie Knows, MD;  Location: [facility] ORS;  Service: Orthopedics;  Laterality: Right;  . TRANSCATH PLACEMENT INTRAVASCULAR STENT LEG  03/2015    SOCIAL HISTORY:   Social History   Tobacco Use  . Smoking status: Former Smoker    Packs/day: 1.00    Years: 3.00    Pack years: 3.00    Types: Cigarettes    Quit date: 06/30/1962    Years since quitting: 57.2  . Smokeless tobacco: Former Systems developer    Types: Chew    Quit date: 12/05/1968  Substance Use Topics  . Alcohol use: No    FAMILY HISTORY:   Family History  Problem Relation Age of Onset  . CVA Father   . Stroke Father   . Lung cancer Mother   . Arthritis Mother   . Stomach cancer Sister   . Colon cancer Sister   . Diabetes Brother     DRUG ALLERGIES:   Allergies  Allergen Reactions  . Allopurinol Diarrhea, Other (See Comments) and Nausea And Vomiting    Other Reaction: GI Upset  . Atenolol Other (See Comments)    Other Reaction: bradycardia  . Atorvastatin Other (See Comments)    Other Reaction: muscle aches  . Ramipril Rash  . Rosuvastatin Other (See Comments)    Muscle aches  . Simvastatin Other (See Comments)    Other Reaction: MUSCLE ACHES (Zocor)  .  Valsartan Other (See Comments)    Other Reaction: facial swelling  . Cardizem [Diltiazem] Rash    REVIEW OF SYSTEMS:   ROS As per history of present illness. All pertinent systems were reviewed above. Constitutional,  HEENT, cardiovascular, respiratory, GI, GU, musculoskeletal, neuro, psychiatric, endocrine,  integumentary and hematologic systems were reviewed and are otherwise  negative/unremarkable except for positive findings mentioned above in the HPI.  MEDICATIONS AT HOME:   Prior to Admission medications   Medication Sig Start Date End Date Taking? Authorizing Provider  amitriptyline (ELAVIL) 50 MG tablet Take 0.5 tablets (25 mg total) by mouth at bedtime as needed. 02/19/19  Yes McLean-Scocuzza, Nino Glow, MD  amLODipine (NORVASC) 5 MG tablet Take 1 tablet (5 mg total) by mouth 2 (two) times daily. If blood pressure >140/>90 make take another 1 pill of 5 mg 05/01/18  Yes Mayo, Pete Pelt, MD  aspirin EC 81 MG EC tablet Take 1 tablet (81 mg total) by mouth daily. 05/02/18  Yes Mayo, Pete Pelt, MD  carvedilol (COREG) 25 MG tablet Take 1 tablet (25 mg total) by mouth 2 (two) times daily with a meal. 03/27/19  Yes McLean-Scocuzza, Nino Glow, MD  Cholecalciferol 1.25 MG (50000 UT) TABS Take 1 tablet by mouth once a week. 07/12/19  Yes McLean-Scocuzza, Nino Glow, MD  cloNIDine (CATAPRES) 0.2 MG tablet Take 1 tablet (0.2 mg total) by mouth 2 (two) times daily. 07/11/19  Yes McLean-Scocuzza, Nino Glow, MD  clopidogrel (PLAVIX) 75 MG tablet Take 75 mg by mouth daily.   Yes [provider]  colchicine 0.6 MG tablet TAKE 1 TABLET BY MOUTH ONCE DAILY Patient taking differently: Take 0.6 mg by mouth daily.  07/12/17  Yes Leone Haven, MD  diclofenac sodium (VOLTAREN) 1 % GEL Apply 2 g topically 4 (four) times daily as needed (for pain).    Yes [provider]  dicyclomine (BENTYL) 10 MG capsule Take 10 mg by mouth 3 (three) times daily before meals.    Yes [provider]   fluticasone (FLONASE) 50 MCG/ACT nasal spray Place 2 sprays into both nostrils daily. 05/10/19  Yes McLean-Scocuzza, Nino Glow, MD  furosemide (LASIX) 40 MG tablet Take 80 mg by mouth every other day.  09/22/18 09/22/19 Yes [provider]  gabapentin (NEURONTIN) 300 MG capsule take 2 capsules (691m) by mouth three times a day 01/16/18  Yes McLean-Scocuzza, TNino Glow MD  hydrALAZINE (APRESOLINE) 100 MG tablet TAKE 100 MG BY MOUTH THREE TIMES A DAY 01/02/19  Yes McLean-Scocuzza, TNino Glow MD  insulin lispro (HUMALOG) 100 UNIT/ML KiwkPen Inject 14-32 Units into the skin 3 (three) times daily. (according to sliding scale - max 96u over 24 hours)   Yes [provider]  ipratropium (ATROVENT) 0.06 % nasal spray Place 2 sprays into both nostrils 4 (four) times daily. 02/09/18  Yes McLean-Scocuzza, TNino Glow MD  isosorbide mononitrate (IMDUR) 120 MG 24 hr tablet TAKE 1 TABLET BY MOUTH EVERY DAY Patient taking differently: Take 120 mg by mouth daily.  10/07/17  Yes McLean-Scocuzza, TNino Glow MD  levocetirizine (XYZAL) 5 MG tablet Take 1 tablet (5 mg total) by mouth every evening. 02/09/18  Yes McLean-Scocuzza, TNino Glow MD  montelukast (SINGULAIR) 10 MG tablet Take 1 tablet (10 mg total) by mouth at bedtime. 07/03/19  Yes McLean-Scocuzza, TNino Glow MD  Multiple Vitamin (MULTI-VITAMINS) TABS Take 1 tablet by mouth daily. 04/15/09  Yes [provider]  Olopatadine HCl 0.6 % SOLN Place 2 sprays into the nose 2 (two) times daily. 06/11/19  Yes GTyler Pita MD  omega-3 acid ethyl esters (LOVAZA) 1 g capsule Take 2 capsules (2 g total) by mouth 2 (two) times daily. 05/12/18  Yes McLean-Scocuzza, TNino Glow MD  omeprazole (PRILOSEC) 40 MG capsule Take 40 mg by mouth 2 (two) times daily.    Yes [provider]  pravastatin (PRAVACHOL) 40 MG tablet Take 1 tablet (  40 mg total) by mouth daily. 03/28/19  Yes McLean-Scocuzza, Nino Glow, MD  probenecid (BENEMID) 500 MG tablet Take 1,000 mg by mouth 2  (two) times daily. 09/03/19  Yes [provider]  sodium chloride HYPERTONIC 3 % nebulizer solution Take by nebulization 2 (two) times daily. 06/14/18  Yes Juanito Doom, MD  spironolactone (ALDACTONE) 25 MG tablet Take 12.5 mg by mouth daily.  09/23/18 09/23/19 Yes [provider]  TRESIBA FLEXTOUCH 200 UNIT/ML SOPN Inject 90 Units into the skin at bedtime. 04/25/18  Yes [provider]  vitamin C (ASCORBIC ACID) 500 MG tablet Take 500 mg by mouth daily.    Yes [provider]  Vitamin E 400 units TABS Take 400 Units by mouth 2 (two) times daily.   Yes [provider]  nitroGLYCERIN (NITROSTAT) 0.4 MG SL tablet Place 1 tablet (0.4 mg total) under the tongue every 5 (five) minutes x 3 doses as needed. For chest pain.  Call 911 if no relief after 3 tablets 10/03/17 11/02/21  McLean-Scocuzza, Nino Glow, MD      VITAL SIGNS:  Blood pressure (!) 102/52, pulse (!) 55, temperature (!) 102 F (38.9 C), temperature source Oral, resp. rate (!) 29, height 5' 8"  (1.727 m), weight 87.1 kg, SpO2 92 %.  PHYSICAL EXAMINATION:  Physical Exam  GENERAL:  73 y.o.-year-old Caucasian male patient lying in the bed in mild respiratory distress with conversational dyspnea EYES: Pupils equal, round, reactive to light and accommodation. No scleral icterus. Extraocular muscles intact.  HEENT: Head atraumatic, normocephalic. Oropharynx and nasopharynx clear.  NECK:  Supple, no jugular venous distention. No thyroid enlargement, no tenderness.  LUNGS: Diminished bibasilar breath sounds with bibasal crackles. CARDIOVASCULAR: Regular rate and rhythm, S1, S2 normal. No murmurs, rubs, or gallops.  ABDOMEN: Soft, nondistended, nontender. Bowel sounds present. No organomegaly or mass.  EXTREMITIES: No pedal edema, cyanosis, or clubbing.  NEUROLOGIC: Cranial nerves II through XII are intact. Muscle strength 5/5 in all extremities. Sensation intact. Gait not checked.  PSYCHIATRIC: The  patient is alert and oriented x 3.  Normal affect and good eye contact. SKIN: No obvious rash, lesion, or ulcer.   LABORATORY PANEL:   CBC Recent Labs  Lab 09/04/19 1837  WBC 8.6  HGB 13.7  HCT 39.8  PLT 304   ------------------------------------------------------------------------------------------------------------------  Chemistries  Recent Labs  Lab 09/04/19 1837  NA 133*  K 3.1*  CL 94*  CO2 22  GLUCOSE 85  BUN 39*  CREATININE 1.90*  CALCIUM 9.1   ------------------------------------------------------------------------------------------------------------------  Cardiac Enzymes No results for input(s): TROPONINI in the last 168 hours. ------------------------------------------------------------------------------------------------------------------  RADIOLOGY:  DG Chest Portable 1 View  Result Date: 09/04/2019 CLINICAL DATA:  Shortness of breath and productive cough. EXAM: PORTABLE CHEST 1 VIEW COMPARISON:  Single-view of the chest 06/24/2019 and 09/16/2018. FINDINGS: There is cardiomegaly and pulmonary vascular congestion. No consolidative process, pneumothorax or effusion. No acute or focal bony abnormality. IMPRESSION: Cardiomegaly and pulmonary vascular congestion. Electronically Signed   By: Inge Rise M.D.   On: 09/04/2019 19:05      IMPRESSION AND PLAN:   1.  Acute hypoxemic respiratory failure secondary to COVID-19. -The patient will be admitted to a medically monitored isolation bed. -O2 protocol will be followed to keep O2 saturation above 93.   2.  Multifocal pneumonia secondary to COVID-19. -The patient will be admitted to an isolation monitored bed with droplet and contact precautions. -Given multifocal pneumonia we will empirically place the patient on IV Rocephin  and Zithromax for possible bacterial superinfection. -The patient will be placed on scheduled Mucinex and as needed Tussionex. -We will avoid nebulization as much as we can, give  bronchodilator MDI if needed, and with deterioration of oxygenation try to avoid BiPAP/CPAP if possible.    -Will obtain sputum Gram stain culture and sensitivity and follow blood cultures. -We will follow CRP, ferritin, LDH and D-dimer. -Will follow manual differential for ANC/ALC ratio as well as follow troponin I and daily CBC with manual differential and CMP. -The patient will be given IV remdesivir as well as IV Decadron. -I discussed convalescent plasma with the patient with benefits and risks and he is agreeable to proceed with it.  3.  Sepsis due to COVID-19.  Is manifested by hypotension with a blood pressure that was down to 81/48 and tachypnea with a heart rate has been up to 30.  Management as above.  Will follow blood and sputum culture.  4.  Acute kidney injury.  This likely prerenal secondary to dehydration.  The patient will be hydrated with IV normal saline.  We will hold off his diuretic therapy with Lasix.  We will follow his BMP.  5.  Hypokalemia.  Potassium will be replaced and magnesium level will be checked.    6.  Hyponatremia and hypochloremia, likely hypovolemic.  The patient will be hydrated with normal saline as mentioned above and BMP will be followed.  7.  Coronary artery disease.  He will be continued on his aspirin and Plavix as well as Imdur as needed sublingual nitroglycerin and Coreg.  8.  Gout.  Colchicine and probenecid will be continued.  7.  Dyslipidemia.  Pravachol resumed.  9.  GERD.  PPI therapy will be resumed.  10.  Type 2 diabetes mellitus.  The patient will be placed on supplement coverage with NovoLog we will continue his basal coverage.  11.  Hypertension.  We will continue his amlodipine, clonidine, Coreg and hydralazine.  12.  DVT prophylaxis.  Subcutaneous Lovenox    All the records are reviewed and case discussed with ED provider. The plan of care was discussed in details with the patient (and family). I answered all questions. The  patient agreed to proceed with the above mentioned plan. Further management will depend upon hospital course.   CODE STATUS: I discussed the CODE STATUS with the patient and he desires to be full code  TOTAL TIME TAKING CARE OF THIS PATIENT: 60 minutes.    Christel Mormon M.D on 09/04/2019 at 9:27 PM  [facility]   From 7 PM-7 AM, contact night-coverage [email or web address]  CC: Primary care physician; McLean-Scocuzza, Nino Glow, MD   Note: This dictation was prepared with Dragon dictation along with smaller phrase technology. Any transcriptional errors that result from this process are unintentional.

## 2019-09-04 NOTE — ED Notes (Signed)
Pt states having diarrhea for the last few days.

## 2019-09-04 NOTE — ED Triage Notes (Signed)
Pt arrives to ED c/o SOB and productive cough. Grandson who lives with pt and wife came back COVID +. Son just went back to work today. Pt appears SOB, tachypenic. Febrile in triage.

## 2019-09-05 DIAGNOSIS — N179 Acute kidney failure, unspecified: Secondary | ICD-10-CM

## 2019-09-05 DIAGNOSIS — J9601 Acute respiratory failure with hypoxia: Secondary | ICD-10-CM | POA: Diagnosis present

## 2019-09-05 DIAGNOSIS — K219 Gastro-esophageal reflux disease without esophagitis: Secondary | ICD-10-CM

## 2019-09-05 DIAGNOSIS — I10 Essential (primary) hypertension: Secondary | ICD-10-CM

## 2019-09-05 DIAGNOSIS — E871 Hypo-osmolality and hyponatremia: Secondary | ICD-10-CM | POA: Diagnosis present

## 2019-09-05 LAB — CBC WITH DIFFERENTIAL/PLATELET
Abs Immature Granulocytes: 0.05 10*3/uL (ref 0.00–0.07)
Basophils Absolute: 0 10*3/uL (ref 0.0–0.1)
Basophils Relative: 0 %
Eosinophils Absolute: 0 10*3/uL (ref 0.0–0.5)
Eosinophils Relative: 0 %
HCT: 35.1 % — ABNORMAL LOW (ref 39.0–52.0)
Hemoglobin: 11.9 g/dL — ABNORMAL LOW (ref 13.0–17.0)
Immature Granulocytes: 1 %
Lymphocytes Relative: 10 %
Lymphs Abs: 0.6 10*3/uL — ABNORMAL LOW (ref 0.7–4.0)
MCH: 33.5 pg (ref 26.0–34.0)
MCHC: 33.9 g/dL (ref 30.0–36.0)
MCV: 98.9 fL (ref 80.0–100.0)
Monocytes Absolute: 0.2 10*3/uL (ref 0.1–1.0)
Monocytes Relative: 4 %
Neutro Abs: 5.3 10*3/uL (ref 1.7–7.7)
Neutrophils Relative %: 85 %
Platelets: 266 10*3/uL (ref 150–400)
RBC: 3.55 MIL/uL — ABNORMAL LOW (ref 4.22–5.81)
RDW: 12.9 % (ref 11.5–15.5)
WBC: 6.2 10*3/uL (ref 4.0–10.5)
nRBC: 0 % (ref 0.0–0.2)

## 2019-09-05 LAB — COMPREHENSIVE METABOLIC PANEL
ALT: 50 U/L — ABNORMAL HIGH (ref 0–44)
ALT: 44 U/L (ref 0–44)
AST: 81 U/L — ABNORMAL HIGH (ref 15–41)
AST: 66 U/L — ABNORMAL HIGH (ref 15–41)
Albumin: 3 g/dL — ABNORMAL LOW (ref 3.5–5.0)
Albumin: 3 g/dL — ABNORMAL LOW (ref 3.5–5.0)
Alkaline Phosphatase: 43 U/L (ref 38–126)
Alkaline Phosphatase: 44 U/L (ref 38–126)
Anion gap: 15 (ref 5–15)
Anion gap: 17 — ABNORMAL HIGH (ref 5–15)
BUN: 42 mg/dL — ABNORMAL HIGH (ref 8–23)
BUN: 43 mg/dL — ABNORMAL HIGH (ref 8–23)
CO2: 20 mmol/L — ABNORMAL LOW (ref 22–32)
CO2: 20 mmol/L — ABNORMAL LOW (ref 22–32)
Calcium: 8.3 mg/dL — ABNORMAL LOW (ref 8.9–10.3)
Calcium: 8.1 mg/dL — ABNORMAL LOW (ref 8.9–10.3)
Chloride: 97 mmol/L — ABNORMAL LOW (ref 98–111)
Chloride: 96 mmol/L — ABNORMAL LOW (ref 98–111)
Creatinine, Ser: 1.9 mg/dL — ABNORMAL HIGH (ref 0.61–1.24)
Creatinine, Ser: 1.71 mg/dL — ABNORMAL HIGH (ref 0.61–1.24)
GFR calc Af Amer: 39 mL/min — ABNORMAL LOW (ref >60)
GFR calc Af Amer: 44 mL/min — ABNORMAL LOW (ref >60)
GFR calc non Af Amer: 34 mL/min — ABNORMAL LOW (ref >60)
GFR calc non Af Amer: 38 mL/min — ABNORMAL LOW (ref >60)
Glucose, Bld: 114 mg/dL — ABNORMAL HIGH (ref 70–99)
Glucose, Bld: 315 mg/dL — ABNORMAL HIGH (ref 70–99)
Potassium: 3.4 mmol/L — ABNORMAL LOW (ref 3.5–5.1)
Potassium: 3.3 mmol/L — ABNORMAL LOW (ref 3.5–5.1)
Sodium: 132 mmol/L — ABNORMAL LOW (ref 135–145)
Sodium: 133 mmol/L — ABNORMAL LOW (ref 135–145)
Total Bilirubin: 1 mg/dL (ref 0.3–1.2)
Total Bilirubin: 1.1 mg/dL (ref 0.3–1.2)
Total Protein: 7.6 g/dL (ref 6.5–8.1)
Total Protein: 7.5 g/dL (ref 6.5–8.1)

## 2019-09-05 LAB — GLUCOSE, CAPILLARY
Glucose-Capillary: 244 mg/dL — ABNORMAL HIGH (ref 70–99)
Glucose-Capillary: 290 mg/dL — ABNORMAL HIGH (ref 70–99)
Glucose-Capillary: 372 mg/dL — ABNORMAL HIGH (ref 70–99)
Glucose-Capillary: 348 mg/dL — ABNORMAL HIGH (ref 70–99)
Glucose-Capillary: 282 mg/dL — ABNORMAL HIGH (ref 70–99)

## 2019-09-05 LAB — PROCALCITONIN: Procalcitonin: <0.1 ng/mL

## 2019-09-05 LAB — ABO/RH: ABO/RH(D): A POS

## 2019-09-05 LAB — LACTATE DEHYDROGENASE: LDH: 212 U/L — ABNORMAL HIGH (ref 98–192)

## 2019-09-05 LAB — MAGNESIUM: Magnesium: 1.9 mg/dL (ref 1.7–2.4)

## 2019-09-05 LAB — C-REACTIVE PROTEIN
CRP: 11.7 mg/dL — ABNORMAL HIGH (ref <1.0)
CRP: 13.7 mg/dL — ABNORMAL HIGH (ref <1.0)

## 2019-09-05 LAB — TROPONIN I (HIGH SENSITIVITY): Troponin I (High Sensitivity): 14 ng/L (ref <18)

## 2019-09-05 LAB — FERRITIN
Ferritin: 318 ng/mL (ref 24–336)
Ferritin: 315 ng/mL (ref 24–336)

## 2019-09-05 LAB — FIBRIN DERIVATIVES D-DIMER (ARMC ONLY)
Fibrin derivatives D-dimer (ARMC): 561.12 ng/mL (FEU) — ABNORMAL HIGH (ref 0.00–499.00)
Fibrin derivatives D-dimer (ARMC): 588.99 ng/mL (FEU) — ABNORMAL HIGH (ref 0.00–499.00)

## 2019-09-05 MED ORDER — POTASSIUM CHLORIDE 20 MEQ PO PACK
40.0000 meq | PACK | Freq: Once | ORAL | Status: AC
  Administered 2019-09-05: 11:00:00 40 meq via ORAL
  Filled 2019-09-05: qty 2

## 2019-09-05 NOTE — Plan of Care (Signed)
Patient alert and oriented and is on 2L nasal cannula. Weakness due to COVID19. Antibiotics and fluids infusing.  Will continue to monitor.  Chase Cabrera

## 2019-09-05 NOTE — Progress Notes (Signed)
PROGRESS NOTE    Chase Cabrera  RDE:[id number] DOB: 1945/11/09 DOA: 09/04/2019  PCP: McLean-Scocuzza, Nino Glow, MD    LOS - 1   Brief Narrative:  73 y.o. male with a history of CHF, coronary artery disease, Crohn's disease, type 2 diabetes mellitus, GERD, hypertension, gout and dyslipidemia, presented to the emergency room with acute onset of shortness of breath with associated generalized weakness, cough for about 4 days.  Started with fever 2 days prior, and non-bloody diarrhea.  Shortness of breath progressively worsened.  Of note, patient lives with grandson who had recently tested positive for Covid-19.  In the ED. Temp 102F, intermittently tachypneic, spO2 90% on RA, improved on 2L/min O2 by [place].  Had transient hypotension 81/48, improved with fluids.  Labs notable for no leukocytosis but mild lymphocytopenia, mild hyponatremia and hypokalemia, AKI with Cr 1.90, mildly elevated AST 81 and ALT 50, BNP mildly elevated 227, troponin 24 -> 26 -> 14, ferritin normal, LDH elevated 212, d-dimer elvated 561 --> 589.  Rapid antigen Covid-19 positive, confirmed by positive PCR.  Chest xray showed pulmonary vascular congestion, however does appear concerning for multifocal pneumonia on personal review.  Patient is admitted for acute hypoxic respiratory failure due to Covid-19 pneumonia, being treated with Remdesivir, Decadron, vitamins C and D, and zinc.  Subjective 12/23: Patient seen this AM.  No acute events reported overnight.  He reports feeling okay.  Generally weak.  Has had diarrhea, including an episode this AM.  Has some cough, intermittently productive.  Denies fever/chills, SOB, chest pain or other acute complaints.  Assessment & Plan:   Principal Problem:   COVID-19 Active Problems:   Acute respiratory failure with hypoxia (HCC)   AKI (acute kidney injury) ([place])   DM type 2 with diabetic peripheral neuropathy (HCC)   Essential hypertension   Hypokalemia   Hyponatremia   CAD  (coronary artery disease)   Gout   Hyperlipidemia   Crohn's disease (HCC)   GERD (gastroesophageal reflux disease)   Severe Sepsis due to Covid-19 - present on admission.   As evidenced by fever, tachypnea, hypoxia, and AKI in setting of Covid-19 pneumonia Acute respiratory failure with hypoxia secondary to COVID-19 multifocal pneumonia --supplemental oxygen, maintain O2 sat > 90% --maintain airborne and contact precautions --continue Remdesivir per pharmacy --continue Decadron --continue vitamin C, zinc, vitamin D --continue Mucinex, Tussionex PRN cough --continue Atrovent inhaler --procal negative, will stop antibiotics started on admission --monitor inflammatory markers CRP, ferritin, d-dimer  Acute Kidney Injury - present on admission.  Likely prerenal azotemia in setting of dehydration. --hold home diuretic --gentle IV hydration for now --monitor BMP daily --avoid nephrotoxic agents, renally dose meds as indicated  Hyponatremia - present on admission, resolved with IV fluid  Hypokalemia - present on admission, repleted.  Resolved.  DM type 2 with diabetic peripheral neuropathy  Home regimen is [place] and Humalog sliding scale insulin.  --basal coverage: Levemir 13 units BID, titrate up as needed --resistant sliding scale TID with meals --continue gabapentin  Essential hypertension - chronic, stable.  Had transient hypotension in ED. --continue home Norvasc, clonidine, Coreg and hydralazine  CAD (coronary artery disease) - stable --continue ASA, Plavix, Coreg, Imdur, PRN nitroglycerin SL  Gout --continue colchicine and probenacid  Hyperlipidemia --continue Pravachol  Crohn's disease - appears stable  GERD - continue home PPI   DVT prophylaxis: Lovenox   Code Status: Full Code  Family Communication: wife updated by phone  Disposition Plan:  Expect Discharge Home once weaned off oxygen,  pending PT eval   Consultants:   None  Procedures:    None  Antimicrobials:   Rocephin and Azithromycin 12/22-12/23, started empirically on admission, procal later negative, stopped    Objective: Vitals:   09/05/19 1424 09/05/19 1426 09/05/19 1427 09/05/19 1430  BP:      Pulse:      Resp:      Temp:      TempSrc:      SpO2: (!) 87% (!) 89% 90% 91%  Weight:      Height:        Intake/Output Summary (Last 24 hours) at 09/05/2019 1452 Last data filed at 09/05/2019 1334 Gross per 24 hour  Intake 686.85 ml  Output 1000 ml  Net -313.15 ml   Filed Weights   09/04/19 1822  Weight: 87.1 kg    Examination:  General exam: awake, alert, no acute distress HEENT: moist mucus membranes, hearing grossly normal  Respiratory system: bibasilar crackles otherwise clear, no wheezes, rales or rhonchi, normal respiratory effort, on 2 L/min nasal cannula. Cardiovascular system: normal S1/S2, RRR, no JVD, murmurs, rubs, gallops, no pedal edema.   Gastrointestinal system: soft, non-tender, non-distended abdomen Central nervous system: alert and oriented x3. no gross focal neurologic deficits, normal speech but delayed responses to questions Extremities: moves all, no edema, normal tone Skin: dry, intact, normal temperature Psychiatry: normal mood, congruent affect, judgement and insight appear normal    Data Reviewed: I have personally reviewed following labs and imaging studies  CBC: Recent Labs  Lab 09/04/19 1837 09/05/19 0737  WBC 8.6 6.2  NEUTROABS  --  5.3  HGB 13.7 11.9*  HCT 39.8 35.1*  MCV 98.5 98.9  PLT 304 097   Basic Metabolic Panel: Recent Labs  Lab 09/04/19 1837 09/04/19 2028 09/05/19 0019 09/05/19 0737  NA 133*  --  132* 133*  K 3.1*  --  3.4* 3.3*  CL 94*  --  97* 96*  CO2 22  --  20* 20*  GLUCOSE 85  --  114* 315*  BUN 39*  --  42* 43*  CREATININE 1.90*  --  1.90* 1.71*  CALCIUM 9.1  --  8.3* 8.1*  MG  --  1.9  --   --    GFR: Estimated Creatinine Clearance: 40.1 mL/min (A) (by C-G formula based on  SCr of 1.71 mg/dL (H)). Liver Function Tests: Recent Labs  Lab 09/05/19 0019 09/05/19 0737  AST 81* 66*  ALT 50* 44  ALKPHOS 43 44  BILITOT 1.0 1.1  PROT 7.6 7.5  ALBUMIN 3.0* 3.0*   No results for input(s): LIPASE, AMYLASE in the last 168 hours. No results for input(s): AMMONIA in the last 168 hours. Coagulation Profile: No results for input(s): INR, PROTIME in the last 168 hours. Cardiac Enzymes: No results for input(s): CKTOTAL, CKMB, CKMBINDEX, TROPONINI in the last 168 hours. BNP (last 3 results) No results for input(s): PROBNP in the last 8760 hours. HbA1C: No results for input(s): HGBA1C in the last 72 hours. CBG: Recent Labs  Lab 09/05/19 0327 09/05/19 0802 09/05/19 1125  GLUCAP 244* 290* 372*   Lipid Profile: No results for input(s): CHOL, HDL, LDLCALC, TRIG, CHOLHDL, LDLDIRECT in the last 72 hours. Thyroid Function Tests: No results for input(s): TSH, T4TOTAL, FREET4, T3FREE, THYROIDAB in the last 72 hours. Anemia Panel: Recent Labs    09/05/19 0019 09/05/19 0737  FERRITIN 318 315   Sepsis Labs: Recent Labs  Lab 09/05/19 0737  PROCALCITON <0.10    Recent  Results (from the past 240 hour(s))  CULTURE, BLOOD (ROUTINE X 2) w Reflex to ID Panel     Status: None (Preliminary result)   Collection Time: 09/04/19  6:38 PM   Specimen: BLOOD  Result Value Ref Range Status   Specimen Description BLOOD RIGHT ANTECUBITAL  Final   Special Requests   Final    BOTTLES DRAWN AEROBIC AND ANAEROBIC Blood Culture adequate volume   Culture   Final    NO GROWTH < 12 HOURS Performed at [facility], [address], [place], [place] [address]    Report Status PENDING  Incomplete  CULTURE, BLOOD (ROUTINE X 2) w Reflex to ID Panel     Status: None (Preliminary result)   Collection Time: 09/04/19  6:40 PM   Specimen: BLOOD  Result Value Ref Range Status   Specimen Description BLOOD LEFT ANTECUBITAL  Final   Special Requests   Final    BOTTLES DRAWN AEROBIC  AND ANAEROBIC Blood Culture adequate volume   Culture   Final    NO GROWTH < 12 HOURS Performed at [facility], [address] [place], [place] [address]    Report Status PENDING  Incomplete         Radiology Studies: DG Chest Portable 1 View  Result Date: 09/04/2019 CLINICAL DATA:  Shortness of breath and productive cough. EXAM: PORTABLE CHEST 1 VIEW COMPARISON:  Single-view of the chest 06/24/2019 and 09/16/2018. FINDINGS: There is cardiomegaly and pulmonary vascular congestion. No consolidative process, pneumothorax or effusion. No acute or focal bony abnormality. IMPRESSION: Cardiomegaly and pulmonary vascular congestion. Electronically Signed   By: Inge Rise M.D.   On: 09/04/2019 19:05        Scheduled Meds: . amLODipine  5 mg Oral BID  . vitamin C  1,000 mg Oral Daily  . aspirin EC  81 mg Oral Daily  . carvedilol  25 mg Oral BID WC  . cholecalciferol  1,000 Units Oral Daily  . cloNIDine  0.2 mg Oral BID  . clopidogrel  75 mg Oral Daily  . colchicine  0.6 mg Oral Daily  . dexamethasone (DECADRON) injection  6 mg Intravenous Q24H  . dicyclomine  10 mg Oral TID AC  . enoxaparin (LOVENOX) injection  40 mg Subcutaneous Q24H  . famotidine  20 mg Oral BID  . fluticasone  2 spray Each Nare Daily  . furosemide  80 mg Oral QODAY  . gabapentin  600 mg Oral TID  . hydrALAZINE  100 mg Oral Q8H  . insulin aspart  0-20 Units Subcutaneous Q4H  . insulin detemir  0.15 Units/kg Subcutaneous BID  . ipratropium  2 spray Each Nare QID  . isosorbide mononitrate  120 mg Oral Daily  . loratadine  10 mg Oral QPM  . montelukast  10 mg Oral QHS  . multivitamin with minerals  1 tablet Oral Daily  . Olopatadine HCl  2 spray Nasal BID  . omega-3 acid ethyl esters  2 g Oral BID  . pantoprazole  40 mg Oral Daily  . pravastatin  40 mg Oral q1800  . probenecid  1,000 mg Oral BID  . sodium chloride flush  3 mL Intravenous Once  . spironolactone  12.5 mg Oral Daily  .  [START ON 09/10/2019] Vitamin D (Ergocalciferol)  50,000 Units Oral Weekly  . vitamin E  400 Units Oral BID  . zinc sulfate  220 mg Oral Daily   Continuous Infusions: . sodium chloride 75 mL/hr at 09/05/19 1334  .  remdesivir 100 mg in NS 100 mL Stopped (09/05/19 1116)     LOS: 1 day    Time spent: 40-45 minutes    Ezekiel Slocumb, DO [facility]   If 7PM-7AM, please contact night-coverage [email or web address] Password [email or web address] 09/05/2019, 2:52 PM

## 2019-09-05 NOTE — Progress Notes (Signed)
Inpatient Diabetes Program Recommendations  AACE/ADA: New Consensus Statement on Inpatient Glycemic Control (2015)  Target Ranges:  Prepandial:   less than 140 mg/dL      Peak postprandial:   less than 180 mg/dL (1-2 hours)      Critically ill patients:  140 - 180 mg/dL   Lab Results  Component Value Date   GLUCAP 290 (H) 09/05/2019   HGBA1C 7.1 (H) 04/30/2018    Review of Glycemic Control Results for TAHA, DIMOND (MRN [id number]) as of 09/05/2019 09:23  Ref. Range 09/05/2019 03:27 09/05/2019 08:02  Glucose-Capillary Latest Ref Range: 70 - 99 mg/dL 244 (H) 290 (H)   Diabetes history: DM 2 Outpatient Diabetes medications:  Tresiba 90 units daily, Humalog 14-32 units tid with meals Current orders for Inpatient glycemic control:  Novolog resistant q 4 hours Levemir 13 units bid Decadron 6 mg IV q 24 hours  Inpatient Diabetes Program Recommendations:    Please consider adding Novolog meal coverage 6 units tid with meals.  Also, based on patient's home medications he will likely need more basal insulin.  Consider increasing Levemir to 25 units bid.    Thanks,  Adah Perl, RN, BC-ADM Inpatient Diabetes Coordinator Pager [phone] (8a-5p)

## 2019-09-05 NOTE — TOC Initial Note (Signed)
Transition of Care [facility]) - Initial/Assessment Note    Patient Details  Name: Chase Cabrera MRN: [id number] Date of Birth: 1945/11/15  [facility] CM/SW Contact:    Shelbie Ammons, RN Phone Number: 09/05/2019, 4:20 PM  Clinical Narrative:         RNCM placed call to patient for high risk assessment as well as to assess for any needs. Patient reports he lives in single level home with wife and adult grandson and his wife. Patient contracted Covid from his grandson however reports wife is currently not having any problems. Patient reports that he still drives and has no issues with transportation and if they do need assistance getting to appointments his grandson helps. Patient reports no problems with his PCP and was agreeable to setting up appointment 3-5 days after discharge. Patient plans to return home at discharge and says that he is unsure if he will need any assistance or not but would be willing to discuss. Patient reports no issues with getting medications except that they are expensive at times. Discussed with patient option of checking about getting medications through insurance company mail order and he reported he has thought of this and is planning to check into it. Will continue to follow.             Expected Discharge Plan: Home/Self Care Barriers to Discharge: No Barriers Identified   Patient Goals and CMS Choice Patient states their goals for this hospitalization and ongoing recovery are:: to get my breathing better and get back home      Expected Discharge Plan and Services Expected Discharge Plan: Home/Self Care   Discharge Planning Services: CM Consult   Living arrangements for the past 2 months: Single Family Home                                      Prior Living Arrangements/Services Living arrangements for the past 2 months: Single Family Home Lives with:: Spouse, Adult Children Patient language and need for interpreter reviewed::  Yes Do you feel safe going back to the place where you live?: Yes      Need for Family Participation in Patient Care: No (Comment) Care giver support system in place?: Yes (comment)   Criminal Activity/Legal Involvement Pertinent to Current Situation/Hospitalization: No - Comment as needed  Activities of Daily Living Home Assistive Devices/Equipment: None ADL Screening (condition at time of admission) Patient's cognitive ability adequate to safely complete daily activities?: Yes Is the patient deaf or have difficulty hearing?: Yes Does the patient have difficulty seeing, even when wearing glasses/contacts?: No Does the patient have difficulty concentrating, remembering, or making decisions?: No Patient able to express need for assistance with ADLs?: Yes Does the patient have difficulty dressing or bathing?: No Independently performs ADLs?: Yes (appropriate for developmental age) Does the patient have difficulty walking or climbing stairs?: Yes Weakness of Legs: Both Weakness of Arms/Hands: None  Permission Sought/Granted Permission sought to share information with : Family Supports    Share Information with NAME: Wife Merchandiser, retail           Emotional Assessment Appearance:: (Assessed via telephone due to Covid) Attitude/Demeanor/Rapport: Engaged   Orientation: : Oriented to Self, Oriented to Place, Oriented to  Time, Oriented to Situation Alcohol / Substance Use: Never Used Psych Involvement: No (comment)  Admission diagnosis:  Dehydration [E86.0] Acute respiratory failure with hypoxia ([place]) [J96.01] Pneumonia due  to COVID-19 virus [U07.1, J12.89] COVID-19 [U07.1] Patient Active Problem List   Diagnosis Date Noted  . Acute respiratory failure with hypoxia ([place]) 09/05/2019  . AKI (acute kidney injury) ([place]) 09/05/2019  . GERD (gastroesophageal reflux disease) 09/05/2019  . Hyponatremia 09/05/2019  . COVID-19 09/04/2019  . Chronic diastolic CHF (congestive heart failure)  ([place]) 07/05/2019  . Dementia ([place]) 02/15/2019  . BPH (benign prostatic hyperplasia) 02/15/2019  . STEMI (ST elevation myocardial infarction) ([place]) 09/16/2018  . Acute ST elevation myocardial infarction (STEMI) of lateral wall ([place]) 09/16/2018  . Diarrhea 08/09/2018  . Allergic rhinitis 08/08/2018  . Bronchiectasis without complication ([place]) 54/62/7035  . TIA (transient ischemic attack) 04/30/2018  . Abnormal thyroid function test 04/03/2018  . Leukocytosis 04/03/2018  . Hematoma of rectus sheath 02/21/2018  . Carotid artery stenosis 02/16/2018  . Altered mental status 02/09/2018  . Hypokalemia 01/20/2018  . OSA (obstructive sleep apnea) 01/12/2018  . Osteoarthritis of right hip 12/13/2017  . Intraabdominal mass 11/23/2017  . Colonic mass 11/18/2017  . Crohn's disease ([place]) 10/20/2017  . Pulmonary nodule 10/07/2017  . History of skin cancer 10/07/2017  . Rib pain on right side 03/07/2017  . Postnasal drip 09/09/2016  . Osteoarthritis 04/08/2016  . Spinal stenosis, lumbar 03/25/2016  . DM type 2 with diabetic peripheral neuropathy ([place]) 07/15/2014  . Gout 05/04/2013  . Renal artery stenosis ([place]) 07/01/2011  . Atrial fibrillation ([place]) 06/30/2011  . CAD (coronary artery disease) 06/30/2011  . Hyperlipidemia 06/30/2011  . Essential hypertension 06/30/2011   PCP:  McLean-Scocuzza, Nino Glow, MD Pharmacy:   [id number] [id number]: [phone]: [phone] [id number] [id number]: [phone]: [address] [place]: [phone]: [phone]   Social Determinants of Health (SDOH) Interventions    Readmission Risk Interventions Readmission Risk Prevention Plan 09/05/2019 09/05/2019   Transportation Screening Complete Complete  Medication Review (RPress photographerComplete -  PCP or Specialist appointment within 3-5 days of discharge Complete -  SW Recovery Care/Counseling Consult Complete -  Palliative Care Screening Not Applicable -  Some recent data might be hidden

## 2019-09-05 NOTE — Evaluation (Signed)
Physical Therapy Evaluation Patient Details Name: Chase Cabrera MRN: [id number] DOB: [birth year] Today's Date: 09/05/2019   History of Present Illness  Pt is a 73 y.o. male presenting to hospital 09/04/19 with SOB, cough, generalized weakness x4 days prior, fever for 2 days prior, and diarrhea.  Per chart pt's grandson COVID (+) recently.  Pt admitted with acute hypoxemic respiratory failure and multi-focal PNA secondary COVID 19, sepsis, AKI, and hypokalemia.  PMH includes CAD, a-fib, CHF, Crohn's disease, DM, htn, MI, peripheral neuropathy, dementia, STEMI, CABG, L THA 2017, and R THA 2019.  Clinical Impression  Prior to hospital admission, pt reports being ambulatory with rollator shorter household distances; pt reports also having motorized w/c but per chart review pt may have previously used scooter for primary mobility; lives with his wife.  Generalized confusion noted during session and increased time to respond also noted.  NT reports pt already ambulated to bathroom on his own but pt reporting he has only been using urinal and has not been OOB.  Currently pt is modified independent with bed mobility; CGA with transfers; and CGA walking a few feet bed to recliner with walker.  Pt initially refusing to get OOB because he did not want to fall and felt his legs were "rubber" but with encouragement pt agreeable to getting to recliner but did not want to do anything else.  Pt's O2 sats 91% on 3 L O2 via nasal cannula at rest; decreased to 86% after getting to chair; and after a few minutes of pursed lip breathing pt's O2 sats increased back up to 91% (all on 3 L O2 via nasal cannula)--pt's nurse notified.  Pt would benefit from skilled PT to address noted impairments and functional limitations (see below for any additional details).  Upon hospital discharge, pt would benefit from HHPT and 24/7 assist with mobility for safety.    Follow Up Recommendations Home health PT;Supervision/Assistance - 24  hour    Equipment Recommendations  Rolling walker with 5" wheels;3in1 (PT)    Recommendations for Other Services       Precautions / Restrictions Precautions Precautions: Fall Restrictions Weight Bearing Restrictions: No      Mobility  Bed Mobility Overal bed mobility: Modified Independent             General bed mobility comments: Semi-supine to sit without any noted difficulties (HOB elevated)  Transfers Overall transfer level: Needs assistance Equipment used: Rolling walker (2 wheeled) Transfers: Sit to/from Stand Sit to Stand: Min guard         General transfer comment: fairly strong stand up to walker; controlled descent sitting into recliner  Ambulation/Gait Ambulation/Gait assistance: Min guard Gait Distance (Feet): 3 Feet(bed to recliner) Assistive device: Rolling walker (2 wheeled)   Gait velocity: decreased   General Gait Details: steady with walker  Stairs            Wheelchair Mobility    Modified Rankin (Stroke Patients Only)       Balance Overall balance assessment: Needs assistance Sitting-balance support: No upper extremity supported;Feet supported Sitting balance-Leahy Scale: Good Sitting balance - Comments: steady sitting reaching within BOS   Standing balance support: Single extremity supported Standing balance-Leahy Scale: Poor Standing balance comment: pt requiring at least single UE support for static standing balance                             Pertinent Vitals/Pain Pain Assessment: No/denies pain  Home Living Family/patient expects to be discharged to:: Private residence Living Arrangements: Spouse/significant other Available Help at Discharge: Family Type of Home: House Home Access: Level entry     [place]: [place]: Environmental consultant - 4 wheels;Wheelchair - power      Prior Function Level of Independence: Independent with assistive device(s)         Comments: Pt reports being  modified independent with ambulating short household distances with rollator but has an electric w/c if need.  Per prior therapy notes about 1 year ago, pt was modified independent with RW for very limited household distances; scooter as primary mobility.     Hand Dominance        Extremity/Trunk Assessment   Upper Extremity Assessment Upper Extremity Assessment: Generalized weakness    Lower Extremity Assessment Lower Extremity Assessment: Generalized weakness    Cervical / Trunk Assessment Cervical / Trunk Assessment: Normal  Communication   Communication: HOH  Cognition Arousal/Alertness: Awake/alert Behavior During Therapy: WFL for tasks assessed/performed Overall Cognitive Status: No family/caregiver present to determine baseline cognitive functioning                                 General Comments: Oriented to person and place and situation; some general confusion noted and increased time to respond also required      General Comments   Nursing cleared pt for participation in physical therapy.  Pt agreeable to PT session with encouragement.    Exercises     Assessment/Plan    PT Assessment Patient needs continued PT services  PT Problem List Decreased strength;Decreased activity tolerance;Decreased balance;Decreased mobility;Decreased knowledge of use of DME;Cardiopulmonary status limiting activity;Decreased knowledge of precautions;Decreased safety awareness       PT Treatment Interventions DME instruction;Gait training;Functional mobility training;Therapeutic activities;Therapeutic exercise;Balance training;Patient/family education    PT Goals (Current goals can be found in the Care Plan section)  Acute Rehab PT Goals Patient Stated Goal: to improve breathing PT Goal Formulation: With patient Time For Goal Achievement: 09/19/19 Potential to Achieve Goals: Fair    Frequency Min 2X/week   Barriers to discharge        Co-evaluation                AM-PAC PT "6 Clicks" Mobility  Outcome Measure Help needed turning from your back to your side while in a flat bed without using bedrails?: None Help needed moving from lying on your back to sitting on the side of a flat bed without using bedrails?: None Help needed moving to and from a bed to a chair (including a wheelchair)?: A Little Help needed standing up from a chair using your arms (e.g., wheelchair or bedside chair)?: A Little Help needed to walk in hospital room?: A Little Help needed climbing 3-5 steps with a railing? : A Lot 6 Click Score: 19    End of Session Equipment Utilized During Treatment: Gait belt Activity Tolerance: Other (comment)(Limited d/t O2 desaturation with activity) Patient left: in chair;with call bell/phone within reach;with chair alarm set(Nurse and NT notified that chair alarm was set but cord not present that attaches to call system to front desk--only goes off in room (Nurse was gowning up to go into room shortly after therapist left room; NT reported she was staying close to listen)) Nurse Communication: Mobility status;Precautions;Other (comment)(Pt's O2 sats during session) PT Visit Diagnosis: Other abnormalities of gait and mobility (R26.89);Muscle weakness (generalized) (  M62.81);Difficulty in walking, not elsewhere classified (R26.2)    Time: 1535-1610 PT Time Calculation (min) (ACUTE ONLY): 35 min   Charges:   PT Evaluation $PT Eval Low Complexity: 1 Low PT Treatments $Therapeutic Activity: 8-22 mins       Leitha Bleak, PT 09/05/19, 5:41 PM

## 2019-09-06 ENCOUNTER — Ambulatory Visit: Payer: Medicare Other | Admitting: Internal Medicine

## 2019-09-06 DIAGNOSIS — E876 Hypokalemia: Secondary | ICD-10-CM

## 2019-09-06 LAB — C-REACTIVE PROTEIN: CRP: 8.4 mg/dL — ABNORMAL HIGH (ref <1.0)

## 2019-09-06 LAB — CBC WITH DIFFERENTIAL/PLATELET
Abs Immature Granulocytes: 0.04 10*3/uL (ref 0.00–0.07)
Basophils Absolute: 0 10*3/uL (ref 0.0–0.1)
Basophils Relative: 0 %
Eosinophils Absolute: 0 10*3/uL (ref 0.0–0.5)
Eosinophils Relative: 0 %
HCT: 34.2 % — ABNORMAL LOW (ref 39.0–52.0)
Hemoglobin: 12.2 g/dL — ABNORMAL LOW (ref 13.0–17.0)
Immature Granulocytes: 1 %
Lymphocytes Relative: 7 %
Lymphs Abs: 0.6 10*3/uL — ABNORMAL LOW (ref 0.7–4.0)
MCH: 33.2 pg (ref 26.0–34.0)
MCHC: 35.7 g/dL (ref 30.0–36.0)
MCV: 93.2 fL (ref 80.0–100.0)
Monocytes Absolute: 0.4 10*3/uL (ref 0.1–1.0)
Monocytes Relative: 4 %
Neutro Abs: 7 10*3/uL (ref 1.7–7.7)
Neutrophils Relative %: 88 %
Platelets: 302 10*3/uL (ref 150–400)
RBC: 3.67 MIL/uL — ABNORMAL LOW (ref 4.22–5.81)
RDW: 12.7 % (ref 11.5–15.5)
WBC: 7.9 10*3/uL (ref 4.0–10.5)
nRBC: 0 % (ref 0.0–0.2)

## 2019-09-06 LAB — COMPREHENSIVE METABOLIC PANEL
ALT: 37 U/L (ref 0–44)
AST: 49 U/L — ABNORMAL HIGH (ref 15–41)
Albumin: 3 g/dL — ABNORMAL LOW (ref 3.5–5.0)
Alkaline Phosphatase: 44 U/L (ref 38–126)
Anion gap: 12 (ref 5–15)
BUN: 37 mg/dL — ABNORMAL HIGH (ref 8–23)
CO2: 25 mmol/L (ref 22–32)
Calcium: 8.3 mg/dL — ABNORMAL LOW (ref 8.9–10.3)
Chloride: 99 mmol/L (ref 98–111)
Creatinine, Ser: 1.4 mg/dL — ABNORMAL HIGH (ref 0.61–1.24)
GFR calc Af Amer: 57 mL/min — ABNORMAL LOW (ref >60)
GFR calc non Af Amer: 49 mL/min — ABNORMAL LOW (ref >60)
Glucose, Bld: 161 mg/dL — ABNORMAL HIGH (ref 70–99)
Potassium: 3.3 mmol/L — ABNORMAL LOW (ref 3.5–5.1)
Sodium: 136 mmol/L (ref 135–145)
Total Bilirubin: 0.5 mg/dL (ref 0.3–1.2)
Total Protein: 7.5 g/dL (ref 6.5–8.1)

## 2019-09-06 LAB — GLUCOSE, CAPILLARY
Glucose-Capillary: 131 mg/dL — ABNORMAL HIGH (ref 70–99)
Glucose-Capillary: 157 mg/dL — ABNORMAL HIGH (ref 70–99)
Glucose-Capillary: 169 mg/dL — ABNORMAL HIGH (ref 70–99)
Glucose-Capillary: 185 mg/dL — ABNORMAL HIGH (ref 70–99)
Glucose-Capillary: 194 mg/dL — ABNORMAL HIGH (ref 70–99)
Glucose-Capillary: 216 mg/dL — ABNORMAL HIGH (ref 70–99)
Glucose-Capillary: 267 mg/dL — ABNORMAL HIGH (ref 70–99)

## 2019-09-06 LAB — FERRITIN: Ferritin: 335 ng/mL (ref 24–336)

## 2019-09-06 LAB — TROPONIN I (HIGH SENSITIVITY): Troponin I (High Sensitivity): 14 ng/L (ref <18)

## 2019-09-06 LAB — FIBRIN DERIVATIVES D-DIMER (ARMC ONLY): Fibrin derivatives D-dimer (ARMC): 498.72 ng/mL (FEU) (ref 0.00–499.00)

## 2019-09-06 MED ORDER — TOCILIZUMAB 400 MG/20ML IV SOLN
8.0000 mg/kg | Freq: Once | INTRAVENOUS | Status: AC
  Administered 2019-09-06: 23:00:00 696 mg via INTRAVENOUS
  Filled 2019-09-06: qty 34.8

## 2019-09-06 MED ORDER — POTASSIUM CHLORIDE CRYS ER 20 MEQ PO TBCR
40.0000 meq | EXTENDED_RELEASE_TABLET | ORAL | Status: AC
  Administered 2019-09-06 (×2): 40 meq via ORAL
  Filled 2019-09-06 (×2): qty 2

## 2019-09-06 MED ORDER — IPRATROPIUM-ALBUTEROL 20-100 MCG/ACT IN AERS
1.0000 | INHALATION_SPRAY | Freq: Four times a day (QID) | RESPIRATORY_TRACT | Status: DC | PRN
  Administered 2019-09-07 – 2019-09-09 (×5): 1 via RESPIRATORY_TRACT
  Filled 2019-09-06 (×2): qty 4

## 2019-09-06 NOTE — Progress Notes (Signed)
PROGRESS NOTE    Chase Cabrera  QBH:[id number] DOB: [birth year] DOA: 09/04/2019  PCP: McLean-Scocuzza, Nino Glow, MD    LOS - 2   Brief Narrative:  73 y.o.malewith a history ofCHF, coronary artery disease, Crohn's disease, type 2 diabetes mellitus, GERD, hypertension, gout and dyslipidemia, presented to the emergency room with acute onset of shortness of breath with associated generalized weakness, cough for about 4 days.  Started with fever 2 days prior, and non-bloody diarrhea.  Shortness of breath progressively worsened.  Of note, patient lives with grandson who had recently tested positive for Covid-19.  In the ED. Temp 102F, intermittently tachypneic, spO2 90% on RA, improved on 2L/min O2 by [place].  Had transient hypotension 81/48, improved with fluids.  Labs notable for no leukocytosis but mild lymphocytopenia, mild hyponatremia and hypokalemia, AKI with Cr 1.90, mildly elevated AST 81 and ALT 50, BNP mildly elevated 227, troponin 24 -> 26 -> 14, ferritin normal, LDH elevated 212, d-dimer elvated 561 --> 589.  Rapid antigen Covid-19 positive, confirmed by positive PCR.  Chest xray showed pulmonary vascular congestion, however does appear concerning for multifocal pneumonia on personal review.  Patient is admitted for acute hypoxic respiratory failure due to Covid-19 pneumonia, being treated with Remdesivir, Decadron, vitamins C and D, and zinc.  Subjective 12/24: Patient seen this AM, sitting up edge of bed.  Says he is still short of breath, worse when up to bathroom.  Denies fever or chills, chest pain, N/V/D or other complaints.  No acute events reported.  Assessment & Plan:   Principal Problem:   COVID-19 Active Problems:   Acute respiratory failure with hypoxia (HCC)   AKI (acute kidney injury) ([place])   DM type 2 with diabetic peripheral neuropathy (HCC)   Essential hypertension   Hypokalemia   Hyponatremia   CAD (coronary artery disease)   Gout   Hyperlipidemia   Crohn's  disease (HCC)   GERD (gastroesophageal reflux disease)   Severe Sepsis due to Covid-19 - present on admission.   As evidenced by fever, tachypnea, hypoxia, and AKI in setting of Covid-19 pneumonia Acute respiratory failure with hypoxia secondary to COVID-19 multifocal pneumonia --supplemental oxygen, maintain O2 sat > 90% --maintain airborne and contact precautions --continue Remdesivir per pharmacy --continue Decadron --continue vitamin C, zinc, vitamin D --continue Mucinex, Tussionex PRN cough --continue Atrovent inhaler --procal negative, will stop antibiotics started on admission --monitor inflammatory markers CRP, ferritin, d-dimer  Acute Kidney Injury - present on admission.  Likely prerenal azotemia in setting of dehydration. --hold home diuretic --gentle IV hydration for now --monitor BMP daily --avoid nephrotoxic agents, renally dose meds as indicated  Hyponatremia - present on admission, resolved with IV fluid  Hypokalemia - present on admission, repleted.  Resolved.  DM type 2 with diabetic peripheral neuropathy  Home regimen is [place] and Humalog sliding scale insulin.  --basal coverage: Levemir 13 units BID, titrate up as needed --resistant sliding scale TID with meals --continue gabapentin  Essential hypertension - chronic, stable.  Had transient hypotension in ED. --continue home Norvasc, clonidine, Coreg and hydralazine  CAD (coronary artery disease) - stable --continue ASA, Plavix, Coreg, Imdur, PRN nitroglycerin SL  Gout --continue colchicine and probenacid  Hyperlipidemia --continue Pravachol  Crohn's disease - appears stable  GERD - continue home PPI   DVT prophylaxis: Lovenox   Code Status: Full Code  Family Communication: son Marlou Sa updated by phone  Disposition Plan:  Expect Discharge Home once weaned off oxygen.  PT recommending HH PT and 24-hr  supervision/assistance.     Consultants:   None  Procedures:    None  Antimicrobials:   Rocephin and Azithromycin 12/22-12/23, started empirically on admission, procal negative, stopped     Objective: Vitals:   09/05/19 1516 09/05/19 1530 09/05/19 1954 09/06/19 0618  BP:   (!) 142/62 (!) 107/50  Pulse: (!) 57  60 62  Resp:   18 17  Temp:   98.1 F (36.7 C) 98.4 F (36.9 C)  TempSrc:   Oral   SpO2: (!) 86% 91% 95% 90%  Weight:      Height:        Intake/Output Summary (Last 24 hours) at 09/06/2019 0750 Last data filed at 09/06/2019 4765 Gross per 24 hour  Intake 1540.28 ml  Output 2950 ml  Net -1409.72 ml   Filed Weights   09/04/19 1822  Weight: 87.1 kg    Examination:  General exam: awake, alert, no acute distress Respiratory system: left base crackles, right side diminished but clear, no wheezes or rhonchi, increased respiratory effort with conversational dyspnea. Cardiovascular system: normal S1/S2, RRR, no JVD, murmurs, rubs, gallops, no pedal edema.   Gastrointestinal system: soft, non-tender, non-distended abdomen Central nervous system: alert and oriented x3. no gross focal neurologic deficits, normal speech Extremities: moves all, no edema, normal tone Skin: dry, intact, normal temperature Psychiatry: normal mood, congruent affect, judgement and insight appear normal    Data Reviewed: I have personally reviewed following labs and imaging studies  CBC: Recent Labs  Lab 09/04/19 1837 09/05/19 0737 09/06/19 0628  WBC 8.6 6.2 7.9  NEUTROABS  --  5.3 7.0  HGB 13.7 11.9* 12.2*  HCT 39.8 35.1* 34.2*  MCV 98.5 98.9 93.2  PLT 304 266 465   Basic Metabolic Panel: Recent Labs  Lab 09/04/19 1837 09/04/19 2028 09/05/19 0019 09/05/19 0737 09/06/19 0628  NA 133*  --  132* 133* 136  K 3.1*  --  3.4* 3.3* 3.3*  CL 94*  --  97* 96* 99  CO2 22  --  20* 20* 25  GLUCOSE 85  --  114* 315* 161*  BUN 39*  --  42* 43* 37*  CREATININE 1.90*  --  1.90* 1.71* 1.40*  CALCIUM 9.1  --  8.3* 8.1* 8.3*  MG  --  1.9  --    --   --    GFR: Estimated Creatinine Clearance: 48.9 mL/min (A) (by C-G formula based on SCr of 1.4 mg/dL (H)). Liver Function Tests: Recent Labs  Lab 09/05/19 0019 09/05/19 0737 09/06/19 0628  AST 81* 66* 49*  ALT 50* 44 37  ALKPHOS 43 44 44  BILITOT 1.0 1.1 0.5  PROT 7.6 7.5 7.5  ALBUMIN 3.0* 3.0* 3.0*   No results for input(s): LIPASE, AMYLASE in the last 168 hours. No results for input(s): AMMONIA in the last 168 hours. Coagulation Profile: No results for input(s): INR, PROTIME in the last 168 hours. Cardiac Enzymes: No results for input(s): CKTOTAL, CKMB, CKMBINDEX, TROPONINI in the last 168 hours. BNP (last 3 results) No results for input(s): PROBNP in the last 8760 hours. HbA1C: No results for input(s): HGBA1C in the last 72 hours. CBG: Recent Labs  Lab 09/05/19 1514 09/05/19 2056 09/06/19 0028 09/06/19 0537 09/06/19 0620  GLUCAP 348* 282* 169* 131* 157*   Lipid Profile: No results for input(s): CHOL, HDL, LDLCALC, TRIG, CHOLHDL, LDLDIRECT in the last 72 hours. Thyroid Function Tests: No results for input(s): TSH, T4TOTAL, FREET4, T3FREE, THYROIDAB in the last 72 hours. Anemia  Panel: Recent Labs    09/05/19 0737 09/06/19 0628  FERRITIN 315 335   Sepsis Labs: Recent Labs  Lab 09/05/19 0737  PROCALCITON <0.10    Recent Results (from the past 240 hour(s))  CULTURE, BLOOD (ROUTINE X 2) w Reflex to ID Panel     Status: None (Preliminary result)   Collection Time: 09/04/19  6:38 PM   Specimen: BLOOD  Result Value Ref Range Status   Specimen Description BLOOD RIGHT ANTECUBITAL  Final   Special Requests   Final    BOTTLES DRAWN AEROBIC AND ANAEROBIC Blood Culture adequate volume   Culture   Final    NO GROWTH 2 DAYS Performed at [facility], [address], [place], [place] [address]    Report Status PENDING  Incomplete  CULTURE, BLOOD (ROUTINE X 2) w Reflex to ID Panel     Status: None (Preliminary result)   Collection Time: 09/04/19   6:40 PM   Specimen: BLOOD  Result Value Ref Range Status   Specimen Description BLOOD LEFT ANTECUBITAL  Final   Special Requests   Final    BOTTLES DRAWN AEROBIC AND ANAEROBIC Blood Culture adequate volume   Culture   Final    NO GROWTH 2 DAYS Performed at [facility], [address], [place], [place] [address]    Report Status PENDING  Incomplete         Radiology Studies: DG Chest Portable 1 View  Result Date: 09/04/2019 CLINICAL DATA:  Shortness of breath and productive cough. EXAM: PORTABLE CHEST 1 VIEW COMPARISON:  Single-view of the chest 06/24/2019 and 09/16/2018. FINDINGS: There is cardiomegaly and pulmonary vascular congestion. No consolidative process, pneumothorax or effusion. No acute or focal bony abnormality. IMPRESSION: Cardiomegaly and pulmonary vascular congestion. Electronically Signed   By: Inge Rise M.D.   On: 09/04/2019 19:05        Scheduled Meds: . amLODipine  5 mg Oral BID  . vitamin C  1,000 mg Oral Daily  . aspirin EC  81 mg Oral Daily  . carvedilol  25 mg Oral BID WC  . cholecalciferol  1,000 Units Oral Daily  . cloNIDine  0.2 mg Oral BID  . clopidogrel  75 mg Oral Daily  . colchicine  0.6 mg Oral Daily  . dexamethasone (DECADRON) injection  6 mg Intravenous Q24H  . dicyclomine  10 mg Oral TID AC  . enoxaparin (LOVENOX) injection  40 mg Subcutaneous Q24H  . famotidine  20 mg Oral BID  . fluticasone  2 spray Each Nare Daily  . furosemide  80 mg Oral QODAY  . gabapentin  600 mg Oral TID  . hydrALAZINE  100 mg Oral Q8H  . insulin aspart  0-20 Units Subcutaneous Q4H  . insulin detemir  0.15 Units/kg Subcutaneous BID  . isosorbide mononitrate  120 mg Oral Daily  . loratadine  10 mg Oral QPM  . montelukast  10 mg Oral QHS  . multivitamin with minerals  1 tablet Oral Daily  . omega-3 acid ethyl esters  2 g Oral BID  . pantoprazole  40 mg Oral Daily  . pravastatin  40 mg Oral q1800  . probenecid  1,000 mg Oral BID  . sodium  chloride flush  3 mL Intravenous Once  . spironolactone  12.5 mg Oral Daily  . [START ON 09/10/2019] Vitamin D (Ergocalciferol)  50,000 Units Oral Weekly  . vitamin E  400 Units Oral BID  . zinc sulfate  220 mg Oral Daily  Continuous Infusions: . sodium chloride 75 mL/hr at 09/05/19 2302  . remdesivir 100 mg in NS 100 mL Stopped (09/05/19 1116)     LOS: 2 days    Time spent: 35-40 minutes    Ezekiel Slocumb, DO [facility]   If 7PM-7AM, please contact night-coverage [email or web address] Password [email or web address] 09/06/2019, 7:50 AM

## 2019-09-06 NOTE — Progress Notes (Signed)
Notified Rufina Falco, NP about the increase in patient's O2 demand. Respiratory in to see patient and placed on High flow O2. Pt.is tolerating well. Medication order received. See Mar. Attempted to place patient in the prone position, but pt was unable to tolerate due to chronic hip and knee issues. Patient placed in the right lateral position in an attempt to place in the prone position in which he was able to tolerate. Will continue to monitor.

## 2019-09-06 NOTE — Progress Notes (Signed)
    BRIEF OVERNIGHT PROGRESS REPORT   SUBJECTIVE: Notified by RN about patient's increased need for oxygenation. His oxygenation has progressively worsened now requiring HFNC.   OBJECTIVE: Patient seen at the bedside, he was afebrile with blood pressure 140/62 mm Hg and pulse rate 66 beats/min. There were no focal neurological deficits; he was alert and oriented x4, in no acute respiratory distress or use of accessory muscle for breathing.  ASSESSMENT:73 y.o.malewith a history ofCHF, coronary artery disease, Crohn's disease, type 2 diabetes mellitus, GERD, hypertension, gout and dyslipidemia presenting with SOB, generalized weakness and cough.  PLAN: Acute hypoxic respiratory failure with hypoxia in the setting of COVID-19 multifocal pneumonia,  - Supplemental O2 as needed to maintain O2 saturations 88 to 94% - Follow intermittent CXR & ABG as needed - Follow inflammatory markers - Monitor fever curve - Blood cultures shows no growth so far - Continue Remdesivir - Continue Decadron 6 mg Q 24 hours - Inhalers with flutter valve - Continuous pulse oximetry - Vitamins (Zinc and Vitamin C) - Given CRP>8 and worsening hypoxia will give trial of Tocilizumab (ACTEMRA) 8 mg/kg     Rufina Falco, DNP, CCRN, FNP-C [facility] Nurse Practitioner Between 7pm to 7am - Pager [phone]  After 7am go to [email or web address] - password:[email or web address] [facility]  [facility] [employer]  [phone]

## 2019-09-06 NOTE — Progress Notes (Signed)
Physical Therapy Treatment Patient Details Name: Chase Cabrera MRN: [id number] DOB: 10-03-1945 Today's Date: Cabrera    History of Present Illness Chase Cabrera is a 73 y.o. male presenting to hospital 09/04/19 with SOB, cough, generalized weakness x4 days prior, fever for 2 days prior, and diarrhea.  Per chart pt's grandson COVID (+) recently.  Pt admitted with acute hypoxemic respiratory failure and multi-focal PNA secondary COVID 19, sepsis, AKI, and hypokalemia.  PMH includes CAD, a-fib, CHF, Crohn's disease, DM, htn, MI, peripheral neuropathy, dementia, STEMI, CABG, L THA 2017, and R THA 2019.    PT Comments    Pt in bed upon entry, agreeable to participate. Pt on 4L/min upon entry 87-88% SpO2. Pt moved to 6L/min without improvement. Pt agreeable to mobilize to EOB, no assist needed, but unfortunately no improvement in SpO2. Pt bumped again to 8L (no HFNC available, hence maintained [place]), still minimal improvement from 84-85%. NA entered room upon with [name] to assess vitals, SpO2 concordant with authors portable sensor. Pt agreeable to remain up in chair and OOB. Pt moved back to 6L/min at exit. RN/MD made aware of saturation fiasco.    Follow Up Recommendations  Home health PT;Supervision/Assistance - 24 hour     Equipment Recommendations  Rolling walker with 5" wheels;3in1 (PT)    Recommendations for Other Services       Precautions / Restrictions Precautions Precautions: Fall Precaution Comments: SpO2 slow to respond to O2 adjustments Restrictions Weight Bearing Restrictions: No    Mobility  Bed Mobility Overal bed mobility: Modified Independent             General bed mobility comments: moving somewhat well but slightly deconditioned  Transfers Overall transfer level: Needs assistance Equipment used: None Transfers: Sit to/from [facility] Sit to Stand: Min guard Stand pivot transfers: Min guard       General transfer comment:  motivated to perform without assistance, but requires some addiitonal time and heavy effort.  Ambulation/Gait   Gait Distance (Feet): 3 Feet Assistive device: None       General Gait Details: bed to recliner only, Sats remain too low to do any additional AMB   Stairs             Wheelchair Mobility    Modified Rankin (Stroke Patients Only)       Balance Overall balance assessment: Needs assistance Sitting-balance support: No upper extremity supported;Feet supported Sitting balance-Leahy Scale: Good Sitting balance - Comments: steady sitting reaching within BOS   Standing balance support: Single extremity supported Standing balance-Leahy Scale: Poor Standing balance comment: pt requiring at least single UE support for static standing balance                            Cognition Arousal/Alertness: Awake/alert Behavior During Therapy: WFL for tasks assessed/performed Overall Cognitive Status: No family/caregiver present to determine baseline cognitive functioning                                 General Comments: Oriented to person and place and situation; some general confusion noted and increased time to respond also required      Exercises      General Comments        Pertinent Vitals/Pain Pain Assessment: No/denies pain    Home Living  Prior Function            PT Goals (current goals can now be found in the care plan section) Acute Rehab PT Goals Patient Stated Goal: to improve breathing PT Goal Formulation: With patient Time For Goal Achievement: 09/19/19 Potential to Achieve Goals: Fair Progress towards PT goals: Progressing toward goals    Frequency    Min 2X/week      PT Plan Current plan remains appropriate    Co-evaluation              AM-PAC PT "6 Clicks" Mobility   Outcome Measure  Help needed turning from your back to your side while in a flat bed without using  bedrails?: None Help needed moving from lying on your back to sitting on the side of a flat bed without using bedrails?: None Help needed moving to and from a bed to a chair (including a wheelchair)?: A Little Help needed standing up from a chair using your arms (e.g., wheelchair or bedside chair)?: A Little Help needed to walk in hospital room?: A Little Help needed climbing 3-5 steps with a railing? : A Lot 6 Click Score: 19    End of Session   Activity Tolerance: Patient tolerated treatment well;Treatment limited secondary to medical complications (Comment) Patient left: in chair;with call bell/phone within reach;with chair alarm set Nurse Communication: Mobility status;Precautions;Other (comment) PT Visit Diagnosis: Other abnormalities of gait and mobility (R26.89);Muscle weakness (generalized) (M62.81);Difficulty in walking, not elsewhere classified (R26.2)     Time: 2035-5974 PT Time Calculation (min) (ACUTE ONLY): 24 min  Charges:  $Therapeutic Activity: 23-37 mins                     3:11 PM, 09/06/19 Etta Grandchild, PT, DPT Physical Therapist - [facility]  [phone] ([place])    Kloee Ballew C Cabrera, 3:04 PM

## 2019-09-07 ENCOUNTER — Inpatient Hospital Stay
Admission: AD | Admit: 2019-09-07 | Payer: Medicare Other | Source: Other Acute Inpatient Hospital | Admitting: Internal Medicine

## 2019-09-07 DIAGNOSIS — E1142 Type 2 diabetes mellitus with diabetic polyneuropathy: Secondary | ICD-10-CM

## 2019-09-07 LAB — FERRITIN: Ferritin: 342 ng/mL — ABNORMAL HIGH (ref 24–336)

## 2019-09-07 LAB — COMPREHENSIVE METABOLIC PANEL
ALT: 33 U/L (ref 0–44)
AST: 41 U/L (ref 15–41)
Albumin: 2.9 g/dL — ABNORMAL LOW (ref 3.5–5.0)
Alkaline Phosphatase: 45 U/L (ref 38–126)
Anion gap: 12 (ref 5–15)
BUN: 36 mg/dL — ABNORMAL HIGH (ref 8–23)
CO2: 24 mmol/L (ref 22–32)
Calcium: 8.4 mg/dL — ABNORMAL LOW (ref 8.9–10.3)
Chloride: 100 mmol/L (ref 98–111)
Creatinine, Ser: 1.37 mg/dL — ABNORMAL HIGH (ref 0.61–1.24)
GFR calc Af Amer: 58 mL/min — ABNORMAL LOW (ref >60)
GFR calc non Af Amer: 50 mL/min — ABNORMAL LOW (ref >60)
Glucose, Bld: 231 mg/dL — ABNORMAL HIGH (ref 70–99)
Potassium: 4.2 mmol/L (ref 3.5–5.1)
Sodium: 136 mmol/L (ref 135–145)
Total Bilirubin: 0.7 mg/dL (ref 0.3–1.2)
Total Protein: 7.3 g/dL (ref 6.5–8.1)

## 2019-09-07 LAB — GLUCOSE, CAPILLARY
Glucose-Capillary: 135 mg/dL — ABNORMAL HIGH (ref 70–99)
Glucose-Capillary: 206 mg/dL — ABNORMAL HIGH (ref 70–99)
Glucose-Capillary: 225 mg/dL — ABNORMAL HIGH (ref 70–99)
Glucose-Capillary: 227 mg/dL — ABNORMAL HIGH (ref 70–99)
Glucose-Capillary: 236 mg/dL — ABNORMAL HIGH (ref 70–99)
Glucose-Capillary: 339 mg/dL — ABNORMAL HIGH (ref 70–99)

## 2019-09-07 LAB — CBC WITH DIFFERENTIAL/PLATELET
Abs Immature Granulocytes: 0.03 10*3/uL (ref 0.00–0.07)
Basophils Absolute: 0 10*3/uL (ref 0.0–0.1)
Basophils Relative: 0 %
Eosinophils Absolute: 0 10*3/uL (ref 0.0–0.5)
Eosinophils Relative: 0 %
HCT: 34.3 % — ABNORMAL LOW (ref 39.0–52.0)
Hemoglobin: 12.2 g/dL — ABNORMAL LOW (ref 13.0–17.0)
Immature Granulocytes: 1 %
Lymphocytes Relative: 11 %
Lymphs Abs: 0.6 10*3/uL — ABNORMAL LOW (ref 0.7–4.0)
MCH: 33.4 pg (ref 26.0–34.0)
MCHC: 35.6 g/dL (ref 30.0–36.0)
MCV: 94 fL (ref 80.0–100.0)
Monocytes Absolute: 0.2 10*3/uL (ref 0.1–1.0)
Monocytes Relative: 5 %
Neutro Abs: 4.2 10*3/uL (ref 1.7–7.7)
Neutrophils Relative %: 83 %
Platelets: 325 10*3/uL (ref 150–400)
RBC: 3.65 MIL/uL — ABNORMAL LOW (ref 4.22–5.81)
RDW: 13 % (ref 11.5–15.5)
WBC: 5 10*3/uL (ref 4.0–10.5)
nRBC: 0 % (ref 0.0–0.2)

## 2019-09-07 LAB — FIBRIN DERIVATIVES D-DIMER (ARMC ONLY): Fibrin derivatives D-dimer (ARMC): 485.19 ng/mL (FEU) (ref 0.00–499.00)

## 2019-09-07 LAB — C-REACTIVE PROTEIN: CRP: 6.7 mg/dL — ABNORMAL HIGH (ref <1.0)

## 2019-09-07 MED ORDER — FUROSEMIDE 40 MG PO TABS
40.0000 mg | ORAL_TABLET | Freq: Every day | ORAL | Status: DC
  Administered 2019-09-07 – 2019-09-09 (×3): 40 mg via ORAL
  Filled 2019-09-07 (×3): qty 1

## 2019-09-07 NOTE — Progress Notes (Signed)
Bedtime BP medications held per Rachael Fee, NP; also notified of outstanding Flu PCR; does want this obtained; acknowledged. Barbaraann Faster, RN 12:00 AM 09/08/2019

## 2019-09-07 NOTE — Progress Notes (Signed)
BP 109/58, pulse 52; Notified B. Randol Kern, NP of scheduled BP medications; acknowledged; to hold all BP meds and recheck VS in an hour. Will continue to monitor. Barbaraann Faster, RN 9:57 PM 09/07/2019

## 2019-09-07 NOTE — Progress Notes (Signed)
PROGRESS NOTE    Chase Cabrera  NUU:[id number] DOB: [birth year] DOA: 09/04/2019  PCP: McLean-Scocuzza, Nino Glow, MD    LOS - 3   Brief Narrative:  73 y.o.malewith a history ofCHF, coronary artery disease, Crohn's disease, type 2 diabetes mellitus, GERD, hypertension, gout and dyslipidemia, presented to the emergency room with acute onset of shortness of breath with associated generalized weakness, cough for about 4 days.  Started with fever 2 days prior, and non-bloody diarrhea.  Shortness of breath progressively worsened.  Of note, patient lives with grandson who had recently tested positive for Covid-19.  In the ED. Temp 102F, intermittently tachypneic, spO2 90% on RA, improved on 2L/min O2 by [place].  Had transient hypotension 81/48, improved with fluids.  Labs notable for no leukocytosis but mild lymphocytopenia, mild hyponatremia and hypokalemia, AKI with Cr 1.90, mildly elevated AST 81 and ALT 50, BNP mildly elevated 227, troponin 24 -> 26 -> 14, ferritin normal, LDH elevated 212, d-dimer elvated 561 --> 589.  Rapid antigen Covid-19 positive, confirmed by positive PCR.  Chest xray showed pulmonary vascular congestion, however does appear concerning for multifocal pneumonia on personal review.  Patient is admitted for acute hypoxic respiratory failure due to Covid-19 pneumonia, being treated with Remdesivir, Decadron, vitamins C and D, and zinc.  Oxygen requirements increased overnight 12/24, required 10 L/min HFNC, night coverage provider gave Actemra.  On [place] admit list this AM, transfer pending.   Subjective 12/25: Patient seen this AM, says feeling a lot better than last night.  Asks to check his pulse ox because he does not think he still needs 10 L/min oxygen.  Denies fever/chills, SOB, chest pain.  Has intermittent dry cough.  Denies abdominal pain, N/V/D.  Assessment & Plan:   Principal Problem:   COVID-19 Active Problems:   Acute respiratory failure with hypoxia (HCC)   AKI  (acute kidney injury) ([place])   DM type 2 with diabetic peripheral neuropathy (HCC)   Essential hypertension   Hypokalemia   Hyponatremia   CAD (coronary artery disease)   Gout   Hyperlipidemia   Crohn's disease (HCC)   GERD (gastroesophageal reflux disease)   Severe Sepsis due to Covid-19 - present on admission.   As evidenced by fever, tachypnea, hypoxia, and AKI in setting of Covid-19 pneumonia Acute respiratory failure with hypoxia secondary to COVID-19 multifocal pneumonia --received Actemra overnight 12/24 for increased oxygen requirement up to 10L from 3L. --supplemental oxygen, maintain O2 sat > 90% --maintain airborne and contact precautions --continue Remdesivir per pharmacy - last day 12/27 --continue IV Decadron --continue vitamin C, zinc, vitamin D --continue Mucinex, Tussionex PRN cough --continue Atrovent inhaler --procal negative, will stop antibiotics started on admission --monitor inflammatory markers CRP, ferritin, d-dimer  Acute Kidney Injury - present on admission.  Improved with hydration.  IV fluids stopped. Likely prerenal azotemia in setting of dehydration. --will resume diuretic, monitor BP with this (ordered 5m daily, takes 888mevery other day at home) --monitor BMP daily --avoid nephrotoxic agents, renally dose meds as indicated  Hyponatremia - present on admission, resolved with IV fluid  Hypokalemia - present on admission, repleted.  Resolved.  DM type 2 with diabetic peripheral neuropathy  Home regimen is [place] Humalog sliding scale insulin.  --basal coverage: Levemir 13 units BID, titrate up as needed --resistant sliding scale TID with meals --continue gabapentin  Essential hypertension - chronic, stable.  Had transient hypotension in ED. --continue home Norvasc, clonidine, Coreg and hydralazine  CAD (coronary artery disease) - stable --  continue ASA, Plavix, Coreg, Imdur, PRN nitroglycerin SL  Gout --continue colchicine  and probenacid  Hyperlipidemia --continue Pravachol  Crohn's disease - appears stable  GERD - continue home PPI   DVT prophylaxis: Lovenox   Code Status: Full Code  Family Communication: wife updated by phone  Disposition Plan:  Expect Discharge Home once weaned off oxygen, pending PT eval   Consultants:   None  Procedures:   None  Antimicrobials:   Rocephin and Azithromycin 12/22-12/23, started empirically on admission, procal later negative, stopped    Objective: Vitals:   09/07/19 0159 09/07/19 0201 09/07/19 0203 09/07/19 0522  BP: 128/63   (!) 116/59  Pulse: (!) 59 62  (!) 56  Resp: 18   18  Temp:   98.3 F (36.8 C) 98 F (36.7 C)  TempSrc:   Oral   SpO2: (!) 88% 90%  91%  Weight:      Height:        Intake/Output Summary (Last 24 hours) at 09/07/2019 0736 Last data filed at 09/07/2019 0153 Gross per 24 hour  Intake --  Output 1545 ml  Net -1545 ml   Filed Weights   09/04/19 1822  Weight: 87.1 kg    Examination:  General exam: awake, alert, no acute distress HEENT: moist mucus membranes, hearing grossly normal  Respiratory system: basilar crackles bilaterally, no wheezes or rhonchi, normal respiratory effort. On 10 L/min [place]. Cardiovascular system: normal S1/S2, RRR, no JVD, murmurs, rubs, gallops Gastrointestinal system: soft, non-tender, non-distended abdomen, normal bowel sounds. Central nervous system: alert and oriented x3. no gross focal neurologic deficits, normal speech Extremities: moves all, no cyanosis, normal tone Skin: dry, intact, normal temperature Psychiatry: normal mood, congruent affect, judgement and insight appear normal    Data Reviewed: I have personally reviewed following labs and imaging studies  CBC: Recent Labs  Lab 09/04/19 1837 09/05/19 0737 09/06/19 0628 09/07/19 0513  WBC 8.6 6.2 7.9 5.0  NEUTROABS  --  5.3 7.0 4.2  HGB 13.7 11.9* 12.2* 12.2*  HCT 39.8 35.1* 34.2* 34.3*  MCV 98.5 98.9 93.2  94.0  PLT 304 266 302 326   Basic Metabolic Panel: Recent Labs  Lab 09/04/19 1837 09/04/19 2028 09/05/19 0019 09/05/19 0737 09/06/19 0628 09/07/19 0513  NA 133*  --  132* 133* 136 136  K 3.1*  --  3.4* 3.3* 3.3* 4.2  CL 94*  --  97* 96* 99 100  CO2 22  --  20* 20* 25 24  GLUCOSE 85  --  114* 315* 161* 231*  BUN 39*  --  42* 43* 37* 36*  CREATININE 1.90*  --  1.90* 1.71* 1.40* 1.37*  CALCIUM 9.1  --  8.3* 8.1* 8.3* 8.4*  MG  --  1.9  --   --   --   --    GFR: Estimated Creatinine Clearance: 50 mL/min (A) (by C-G formula based on SCr of 1.37 mg/dL (H)). Liver Function Tests: Recent Labs  Lab 09/05/19 0019 09/05/19 0737 09/06/19 0628 09/07/19 0513  AST 81* 66* 49* 41  ALT 50* 44 37 33  ALKPHOS 43 44 44 45  BILITOT 1.0 1.1 0.5 0.7  PROT 7.6 7.5 7.5 7.3  ALBUMIN 3.0* 3.0* 3.0* 2.9*   No results for input(s): LIPASE, AMYLASE in the last 168 hours. No results for input(s): AMMONIA in the last 168 hours. Coagulation Profile: No results for input(s): INR, PROTIME in the last 168 hours. Cardiac Enzymes: No results for input(s): CKTOTAL, CKMB, CKMBINDEX, TROPONINI  in the last 168 hours. BNP (last 3 results) No results for input(s): PROBNP in the last 8760 hours. HbA1C: No results for input(s): HGBA1C in the last 72 hours. CBG: Recent Labs  Lab 09/06/19 1216 09/06/19 1617 09/06/19 2004 09/07/19 0058 09/07/19 0525  GLUCAP 267* 216* 194* 135* 227*   Lipid Profile: No results for input(s): CHOL, HDL, LDLCALC, TRIG, CHOLHDL, LDLDIRECT in the last 72 hours. Thyroid Function Tests: No results for input(s): TSH, T4TOTAL, FREET4, T3FREE, THYROIDAB in the last 72 hours. Anemia Panel: Recent Labs    09/06/19 0628 09/07/19 0513  FERRITIN 335 342*   Sepsis Labs: Recent Labs  Lab 09/05/19 0737  PROCALCITON <0.10    Recent Results (from the past 240 hour(s))  CULTURE, BLOOD (ROUTINE X 2) w Reflex to ID Panel     Status: None (Preliminary result)   Collection Time:  09/04/19  6:38 PM   Specimen: BLOOD  Result Value Ref Range Status   Specimen Description BLOOD RIGHT ANTECUBITAL  Final   Special Requests   Final    BOTTLES DRAWN AEROBIC AND ANAEROBIC Blood Culture adequate volume   Culture   Final    NO GROWTH 3 DAYS Performed at [facility], [address], [place], [place] [address]    Report Status PENDING  Incomplete  CULTURE, BLOOD (ROUTINE X 2) w Reflex to ID Panel     Status: None (Preliminary result)   Collection Time: 09/04/19  6:40 PM   Specimen: BLOOD  Result Value Ref Range Status   Specimen Description BLOOD LEFT ANTECUBITAL  Final   Special Requests   Final    BOTTLES DRAWN AEROBIC AND ANAEROBIC Blood Culture adequate volume   Culture   Final    NO GROWTH 3 DAYS Performed at [facility], [address], [place], [place] [address]    Report Status PENDING  Incomplete         Radiology Studies: No results found.      Scheduled Meds: . amLODipine  5 mg Oral BID  . vitamin C  1,000 mg Oral Daily  . aspirin EC  81 mg Oral Daily  . carvedilol  25 mg Oral BID WC  . cholecalciferol  1,000 Units Oral Daily  . cloNIDine  0.2 mg Oral BID  . clopidogrel  75 mg Oral Daily  . colchicine  0.6 mg Oral Daily  . dexamethasone (DECADRON) injection  6 mg Intravenous Q24H  . dicyclomine  10 mg Oral TID AC  . enoxaparin (LOVENOX) injection  40 mg Subcutaneous Q24H  . famotidine  20 mg Oral BID  . fluticasone  2 spray Each Nare Daily  . furosemide  80 mg Oral QODAY  . gabapentin  600 mg Oral TID  . hydrALAZINE  100 mg Oral Q8H  . insulin aspart  0-20 Units Subcutaneous Q4H  . insulin detemir  0.15 Units/kg Subcutaneous BID  . isosorbide mononitrate  120 mg Oral Daily  . loratadine  10 mg Oral QPM  . montelukast  10 mg Oral QHS  . multivitamin with minerals  1 tablet Oral Daily  . omega-3 acid ethyl esters  2 g Oral BID  . pantoprazole  40 mg Oral Daily  . pravastatin  40 mg Oral q1800  . probenecid  1,000  mg Oral BID  . sodium chloride flush  3 mL Intravenous Once  . spironolactone  12.5 mg Oral Daily  . [START ON 09/10/2019] Vitamin D (Ergocalciferol)  50,000 Units Oral Weekly  .  vitamin E  400 Units Oral BID  . zinc sulfate  220 mg Oral Daily   Continuous Infusions: . sodium chloride 75 mL/hr at 09/07/19 0153  . remdesivir 100 mg in NS 100 mL 100 mg (09/06/19 1015)     LOS: 3 days    Time spent: 40-45 minutes    Ezekiel Slocumb, DO [facility]   If 7PM-7AM, please contact night-coverage [email or web address] Password [facility] 09/07/2019, 7:36 AM

## 2019-09-08 LAB — CBC WITH DIFFERENTIAL/PLATELET
Abs Immature Granulocytes: 0.04 10*3/uL (ref 0.00–0.07)
Basophils Absolute: 0 10*3/uL (ref 0.0–0.1)
Basophils Relative: 0 %
Eosinophils Absolute: 0 10*3/uL (ref 0.0–0.5)
Eosinophils Relative: 0 %
HCT: 35.6 % — ABNORMAL LOW (ref 39.0–52.0)
Hemoglobin: 12.3 g/dL — ABNORMAL LOW (ref 13.0–17.0)
Immature Granulocytes: 1 %
Lymphocytes Relative: 10 %
Lymphs Abs: 0.7 10*3/uL (ref 0.7–4.0)
MCH: 33.9 pg (ref 26.0–34.0)
MCHC: 34.6 g/dL (ref 30.0–36.0)
MCV: 98.1 fL (ref 80.0–100.0)
Monocytes Absolute: 0.3 10*3/uL (ref 0.1–1.0)
Monocytes Relative: 4 %
Neutro Abs: 6.1 10*3/uL (ref 1.7–7.7)
Neutrophils Relative %: 85 %
Platelets: 375 10*3/uL (ref 150–400)
RBC: 3.63 MIL/uL — ABNORMAL LOW (ref 4.22–5.81)
RDW: 12.9 % (ref 11.5–15.5)
WBC: 7.2 10*3/uL (ref 4.0–10.5)
nRBC: 0 % (ref 0.0–0.2)

## 2019-09-08 LAB — COMPREHENSIVE METABOLIC PANEL
ALT: 29 U/L (ref 0–44)
AST: 42 U/L — ABNORMAL HIGH (ref 15–41)
Albumin: 2.8 g/dL — ABNORMAL LOW (ref 3.5–5.0)
Alkaline Phosphatase: 48 U/L (ref 38–126)
Anion gap: 10 (ref 5–15)
BUN: 34 mg/dL — ABNORMAL HIGH (ref 8–23)
CO2: 27 mmol/L (ref 22–32)
Calcium: 8.7 mg/dL — ABNORMAL LOW (ref 8.9–10.3)
Chloride: 97 mmol/L — ABNORMAL LOW (ref 98–111)
Creatinine, Ser: 1.28 mg/dL — ABNORMAL HIGH (ref 0.61–1.24)
GFR calc Af Amer: >60 mL/min (ref >60)
GFR calc non Af Amer: 54 mL/min — ABNORMAL LOW (ref >60)
Glucose, Bld: 184 mg/dL — ABNORMAL HIGH (ref 70–99)
Potassium: 4.4 mmol/L (ref 3.5–5.1)
Sodium: 134 mmol/L — ABNORMAL LOW (ref 135–145)
Total Bilirubin: 0.7 mg/dL (ref 0.3–1.2)
Total Protein: 7.2 g/dL (ref 6.5–8.1)

## 2019-09-08 LAB — INFLUENZA PANEL BY PCR (TYPE A & B)
Influenza A By PCR: NEGATIVE
Influenza B By PCR: NEGATIVE

## 2019-09-08 LAB — FIBRIN DERIVATIVES D-DIMER (ARMC ONLY): Fibrin derivatives D-dimer (ARMC): 519.65 ng/mL (FEU) — ABNORMAL HIGH (ref 0.00–499.00)

## 2019-09-08 LAB — GLUCOSE, CAPILLARY
Glucose-Capillary: 158 mg/dL — ABNORMAL HIGH (ref 70–99)
Glucose-Capillary: 186 mg/dL — ABNORMAL HIGH (ref 70–99)
Glucose-Capillary: 199 mg/dL — ABNORMAL HIGH (ref 70–99)
Glucose-Capillary: 226 mg/dL — ABNORMAL HIGH (ref 70–99)
Glucose-Capillary: 232 mg/dL — ABNORMAL HIGH (ref 70–99)
Glucose-Capillary: 234 mg/dL — ABNORMAL HIGH (ref 70–99)
Glucose-Capillary: 272 mg/dL — ABNORMAL HIGH (ref 70–99)

## 2019-09-08 LAB — FERRITIN: Ferritin: 251 ng/mL (ref 24–336)

## 2019-09-08 LAB — C-REACTIVE PROTEIN: CRP: 4.9 mg/dL — ABNORMAL HIGH (ref <1.0)

## 2019-09-08 MED ORDER — SODIUM CHLORIDE 0.9 % IV SOLN
INTRAVENOUS | Status: DC | PRN
  Administered 2019-09-08: 1000 mL via INTRAVENOUS

## 2019-09-08 NOTE — Progress Notes (Signed)
Lasix 80 mg held per MD orders. Coreg held this am due to HR below 60. MDGriffith aware.

## 2019-09-08 NOTE — Progress Notes (Signed)
PROGRESS NOTE    HAVARD Cabrera  JJH:[id number] DOB: February 21, 1946 DOA: 09/04/2019  PCP: McLean-Scocuzza, Nino Glow, MD    LOS - 4   Brief Narrative:  73 y.o.malewith a history ofCHF, coronary artery disease, Crohn's disease, type 2 diabetes mellitus, GERD, hypertension, gout and dyslipidemia, presented to the emergency room with acute onset ofshortness of breath with associated generalized weakness, cough for about 4 days. Started with fever 2 days prior, and non-bloody diarrhea. Shortness of breath progressively worsened. Of note, patient lives with grandson who had recently tested positive for Covid-19. In the ED. Temp 102F, intermittently tachypneic, spO2 90% on RA, improved on 2L/min O2 by [place]. Had transient hypotension 81/48, improved with fluids. Labs notable for no leukocytosis but mild lymphocytopenia, mild hyponatremia and hypokalemia, AKI with Cr 1.90, mildly elevated AST 81 and ALT 50, BNP mildly elevated 227, troponin 24 ->26 ->14, ferritin normal, LDH elevated 212, d-dimer elvated 561 -->589. Rapid antigen Covid-19 positive, confirmed by positive PCR. Chest xray showed pulmonary vascular congestion, however does appear concerning for multifocal pneumonia on personal review. Patient is admitted for acute hypoxic respiratory failure due to Covid-19 pneumonia, being treated with Remdesivir, Decadron, vitamins C and D, and zinc.  Oxygen requirements increased overnight 12/24, required 10 L/min HFNC, night coverage provider gave Actemra.  Morning of 12/26 spO2 87% on 11 L/min HFNC.  On [place] admit list as of 12/25 AM, transfer pending.  Subjective 12/26: Patient seen and examined this morning.  No acute events reported overnight.  He continues to require 14 L/min HFNC to maintain O2 sat above 90%.  Patient today reports feeling well, actually asked if he can go home today.  He denies fevers or chills, chest pain, shortness of breath, cough, nausea vomiting or  diarrhea.  Assessment & Plan:   Principal Problem:   COVID-19 Active Problems:   Acute respiratory failure with hypoxia (HCC)   AKI (acute kidney injury) ([place])   DM type 2 with diabetic peripheral neuropathy (HCC)   Essential hypertension   Hypokalemia   Hyponatremia   CAD (coronary artery disease)   Gout   Hyperlipidemia   Crohn's disease (HCC)   GERD (gastroesophageal reflux disease)   Severe Sepsis due to Covid-19 - present on admission.  As evidenced by fever, tachypnea, hypoxia, and AKI in setting of Covid-19 pneumonia Acute respiratory failure with hypoxiasecondary to COVID-19 multifocal pneumonia --12/25: received Actemra overnight for increased oxygen requirement up to 10L from 3L. --12/26 now up to 14 L/min to get to 90% --supplemental oxygen, maintain O2 sat > 90% --maintain airborne and contact precautions --continue Remdesivir per pharmacy - last day 12/27 --continue IV Decadron --continue vitamin C, zinc, vitamin D --continue Mucinex, Tussionex PRN cough --continue Atrovent inhaler --procal negative, will stop antibiotics started on admission --monitor inflammatory markers CRP, ferritin, d-dimer --[facility] transfer pending as of 12/25 AM - on the admit list.      [facility] will contact the unit with ETA    Acute Kidney Injury- present on admission.  Improved with hydration.  IV fluids stopped. Likely prerenal azotemia in setting of dehydration. --will resume diuretic, monitor BP with this (ordered 12m daily, takes 841mevery other day at home) --monitor BMP daily --avoid nephrotoxic agents, renally dose meds as indicated  Hyponatremia - present on admission, resolved with IV fluid  Hypokalemia - present on admission, repleted. Resolved.  DM type 2 with diabetic peripheral neuropathy Home regimen is Tr[place]ua and Barbudand Humalog sliding scale insulin.  --basal coverage: Levemir 13 units  BID, titrate up as needed --resistant sliding scale TID with  meals --continue gabapentin  Essential hypertension- chronic, stable. Had transient hypotension in ED. --continue home Norvasc, clonidine, Coreg and hydralazine  CAD (coronary artery disease)- stable --continue ASA, Plavix, Coreg, Imdur, PRN nitroglycerin SL  Gout --continue colchicine and probenacid  Hyperlipidemia --continue Pravachol  Crohn's disease- appears stable  GERD- continue home PPI   DVT prophylaxis:Lovenox Code Status: Full Code Family Communication:wife updated by phone Disposition Plan:Expect Discharge Home once weaned off oxygen, pending PT eval   Consultants:  None  Procedures:  None  Antimicrobials:  Rocephin and Azithromycin 12/22-12/23, started empirically on admission, procal later negative, stopped   Objective: Vitals:   09/07/19 1929 09/07/19 2121 09/07/19 2329 09/08/19 0435  BP:  (!) 109/58 (!) 124/58 126/63  Pulse:  (!) 52 (!) 56 (!) 51  Resp:  20  (!) 24  Temp:  98 F (36.7 C)  97.8 F (36.6 C)  TempSrc:  Oral  Oral  SpO2: 91% (!) 89% (!) 87% (!) 87%  Weight:      Height:        Intake/Output Summary (Last 24 hours) at 09/08/2019 0752 Last data filed at 09/08/2019 0528 Gross per 24 hour  Intake 2403.32 ml  Output 1975 ml  Net 428.32 ml   Filed Weights   09/04/19 1822  Weight: 87.1 kg    Examination:  General exam: awake, alert, no acute distress HEENT: moist mucus membranes, hearing impairment Respiratory system: clear to auscultation bilaterally, no wheezes, rales or rhonchi, normal respiratory effort. Cardiovascular system: normal S1/S2, RRR, no JVD, murmurs, rubs, gallops, no pedal edema.   Central nervous system: alert and oriented x3. no gross focal neurologic deficits, normal speech Extremities: moves all, no cyanosis, normal tone Skin: dry, intact, normal temperature Psychiatry: normal mood, congruent affect    Data Reviewed: I have personally reviewed following labs and  imaging studies  CBC: Recent Labs  Lab 09/04/19 1837 09/05/19 0737 09/06/19 0628 09/07/19 0513 09/08/19 0404  WBC 8.6 6.2 7.9 5.0 7.2  NEUTROABS  --  5.3 7.0 4.2 6.1  HGB 13.7 11.9* 12.2* 12.2* 12.3*  HCT 39.8 35.1* 34.2* 34.3* 35.6*  MCV 98.5 98.9 93.2 94.0 98.1  PLT 304 266 302 325 528   Basic Metabolic Panel: Recent Labs  Lab 09/04/19 2028 09/05/19 0019 09/05/19 0737 09/06/19 0628 09/07/19 0513 09/08/19 0404  NA  --  132* 133* 136 136 134*  K  --  3.4* 3.3* 3.3* 4.2 4.4  CL  --  97* 96* 99 100 97*  CO2  --  20* 20* 25 24 27   GLUCOSE  --  114* 315* 161* 231* 184*  BUN  --  42* 43* 37* 36* 34*  CREATININE  --  1.90* 1.71* 1.40* 1.37* 1.28*  CALCIUM  --  8.3* 8.1* 8.3* 8.4* 8.7*  MG 1.9  --   --   --   --   --    GFR: Estimated Creatinine Clearance: 53.5 mL/min (A) (by C-G formula based on SCr of 1.28 mg/dL (H)). Liver Function Tests: Recent Labs  Lab 09/05/19 0019 09/05/19 0737 09/06/19 0628 09/07/19 0513 09/08/19 0404  AST 81* 66* 49* 41 42*  ALT 50* 44 37 33 29  ALKPHOS 43 44 44 45 48  BILITOT 1.0 1.1 0.5 0.7 0.7  PROT 7.6 7.5 7.5 7.3 7.2  ALBUMIN 3.0* 3.0* 3.0* 2.9* 2.8*   No results for input(s): LIPASE, AMYLASE in the last 168 hours. No results  for input(s): AMMONIA in the last 168 hours. Coagulation Profile: No results for input(s): INR, PROTIME in the last 168 hours. Cardiac Enzymes: No results for input(s): CKTOTAL, CKMB, CKMBINDEX, TROPONINI in the last 168 hours. BNP (last 3 results) No results for input(s): PROBNP in the last 8760 hours. HbA1C: No results for input(s): HGBA1C in the last 72 hours. CBG: Recent Labs  Lab 09/07/19 1721 09/07/19 2115 09/08/19 0022 09/08/19 0435 09/08/19 0744  GLUCAP 236* 225* 199* 186* 158*   Lipid Profile: No results for input(s): CHOL, HDL, LDLCALC, TRIG, CHOLHDL, LDLDIRECT in the last 72 hours. Thyroid Function Tests: No results for input(s): TSH, T4TOTAL, FREET4, T3FREE, THYROIDAB in the last 72  hours. Anemia Panel: Recent Labs    09/07/19 0513 09/08/19 0404  FERRITIN 342* 251   Sepsis Labs: Recent Labs  Lab 09/05/19 0737  PROCALCITON <0.10    Recent Results (from the past 240 hour(s))  CULTURE, BLOOD (ROUTINE X 2) w Reflex to ID Panel     Status: None (Preliminary result)   Collection Time: 09/04/19  6:38 PM   Specimen: BLOOD  Result Value Ref Range Status   Specimen Description BLOOD RIGHT ANTECUBITAL  Final   Special Requests   Final    BOTTLES DRAWN AEROBIC AND ANAEROBIC Blood Culture adequate volume   Culture   Final    NO GROWTH 4 DAYS Performed at [facility], [address] [place], [place] [address]    Report Status PENDING  Incomplete  CULTURE, BLOOD (ROUTINE X 2) w Reflex to ID Panel     Status: None (Preliminary result)   Collection Time: 09/04/19  6:40 PM   Specimen: BLOOD  Result Value Ref Range Status   Specimen Description BLOOD LEFT ANTECUBITAL  Final   Special Requests   Final    BOTTLES DRAWN AEROBIC AND ANAEROBIC Blood Culture adequate volume   Culture   Final    NO GROWTH 4 DAYS Performed at [facility], [address] [place], [place] [address]    Report Status PENDING  Incomplete         Radiology Studies: No results found.      Scheduled Meds: . amLODipine  5 mg Oral BID  . vitamin C  1,000 mg Oral Daily  . aspirin EC  81 mg Oral Daily  . carvedilol  25 mg Oral BID WC  . cholecalciferol  1,000 Units Oral Daily  . cloNIDine  0.2 mg Oral BID  . clopidogrel  75 mg Oral Daily  . colchicine  0.6 mg Oral Daily  . dexamethasone (DECADRON) injection  6 mg Intravenous Q24H  . dicyclomine  10 mg Oral TID AC  . enoxaparin (LOVENOX) injection  40 mg Subcutaneous Q24H  . famotidine  20 mg Oral BID  . fluticasone  2 spray Each Nare Daily  . furosemide  40 mg Oral Daily  . furosemide  80 mg Oral QODAY  . gabapentin  600 mg Oral TID  . hydrALAZINE  100 mg Oral Q8H  . insulin aspart  0-20 Units Subcutaneous  Q4H  . insulin detemir  0.15 Units/kg Subcutaneous BID  . isosorbide mononitrate  120 mg Oral Daily  . loratadine  10 mg Oral QPM  . montelukast  10 mg Oral QHS  . multivitamin with minerals  1 tablet Oral Daily  . omega-3 acid ethyl esters  2 g Oral BID  . pantoprazole  40 mg Oral Daily  . pravastatin  40 mg Oral q1800  .  probenecid  1,000 mg Oral BID  . sodium chloride flush  3 mL Intravenous Once  . spironolactone  12.5 mg Oral Daily  . [START ON 09/10/2019] Vitamin D (Ergocalciferol)  50,000 Units Oral Weekly  . vitamin E  400 Units Oral BID  . zinc sulfate  220 mg Oral Daily   Continuous Infusions: . remdesivir 100 mg in NS 100 mL Stopped (09/07/19 1109)     LOS: 4 days    Time spent: 35 minutes    Ezekiel Slocumb, DO [facility]   If 7PM-7AM, please contact night-coverage [email or web address] Password [facility] 09/08/2019, 7:52 AM

## 2019-09-08 NOTE — Progress Notes (Signed)
Antihypertensives and coreg held throughout night secondary to  Soft blood pressures and heart rate below 60. No changes in mental status

## 2019-09-09 ENCOUNTER — Inpatient Hospital Stay (HOSPITAL_COMMUNITY)
Admission: AD | Admit: 2019-09-09 | Discharge: 2019-09-25 | DRG: 177 | Disposition: A | Discharge status: Home Health | Payer: Medicare Other | Source: Other Acute Inpatient Hospital | Attending: Family Medicine | Admitting: Family Medicine

## 2019-09-09 DIAGNOSIS — E1122 Type 2 diabetes mellitus with diabetic chronic kidney disease: Secondary | ICD-10-CM | POA: Diagnosis present

## 2019-09-09 DIAGNOSIS — D72810 Lymphocytopenia: Secondary | ICD-10-CM | POA: Diagnosis present

## 2019-09-09 DIAGNOSIS — E1165 Type 2 diabetes mellitus with hyperglycemia: Secondary | ICD-10-CM | POA: Diagnosis not present

## 2019-09-09 DIAGNOSIS — Z87891 Personal history of nicotine dependence: Secondary | ICD-10-CM

## 2019-09-09 DIAGNOSIS — Z7982 Long term (current) use of aspirin: Secondary | ICD-10-CM

## 2019-09-09 DIAGNOSIS — I509 Heart failure, unspecified: Secondary | ICD-10-CM

## 2019-09-09 DIAGNOSIS — I4891 Unspecified atrial fibrillation: Secondary | ICD-10-CM | POA: Diagnosis present

## 2019-09-09 DIAGNOSIS — Z951 Presence of aortocoronary bypass graft: Secondary | ICD-10-CM

## 2019-09-09 DIAGNOSIS — E1142 Type 2 diabetes mellitus with diabetic polyneuropathy: Secondary | ICD-10-CM | POA: Diagnosis present

## 2019-09-09 DIAGNOSIS — Z823 Family history of stroke: Secondary | ICD-10-CM

## 2019-09-09 DIAGNOSIS — Z833 Family history of diabetes mellitus: Secondary | ICD-10-CM

## 2019-09-09 DIAGNOSIS — Z955 Presence of coronary angioplasty implant and graft: Secondary | ICD-10-CM | POA: Diagnosis not present

## 2019-09-09 DIAGNOSIS — K5090 Crohn's disease, unspecified, without complications: Secondary | ICD-10-CM | POA: Diagnosis not present

## 2019-09-09 DIAGNOSIS — I130 Hypertensive heart and chronic kidney disease with heart failure and stage 1 through stage 4 chronic kidney disease, or unspecified chronic kidney disease: Secondary | ICD-10-CM | POA: Diagnosis not present

## 2019-09-09 DIAGNOSIS — I252 Old myocardial infarction: Secondary | ICD-10-CM | POA: Diagnosis not present

## 2019-09-09 DIAGNOSIS — Z8261 Family history of arthritis: Secondary | ICD-10-CM

## 2019-09-09 DIAGNOSIS — R031 Nonspecific low blood-pressure reading: Secondary | ICD-10-CM | POA: Diagnosis present

## 2019-09-09 DIAGNOSIS — J069 Acute upper respiratory infection, unspecified: Secondary | ICD-10-CM

## 2019-09-09 DIAGNOSIS — U071 COVID-19: Principal | ICD-10-CM

## 2019-09-09 DIAGNOSIS — T502X5A Adverse effect of carbonic-anhydrase inhibitors, benzothiadiazides and other diuretics, initial encounter: Secondary | ICD-10-CM | POA: Diagnosis present

## 2019-09-09 DIAGNOSIS — E876 Hypokalemia: Secondary | ICD-10-CM | POA: Diagnosis not present

## 2019-09-09 DIAGNOSIS — I2510 Atherosclerotic heart disease of native coronary artery without angina pectoris: Secondary | ICD-10-CM | POA: Diagnosis present

## 2019-09-09 DIAGNOSIS — I10 Essential (primary) hypertension: Secondary | ICD-10-CM | POA: Diagnosis not present

## 2019-09-09 DIAGNOSIS — N400 Enlarged prostate without lower urinary tract symptoms: Secondary | ICD-10-CM | POA: Diagnosis present

## 2019-09-09 DIAGNOSIS — M109 Gout, unspecified: Secondary | ICD-10-CM | POA: Diagnosis present

## 2019-09-09 DIAGNOSIS — I5042 Chronic combined systolic (congestive) and diastolic (congestive) heart failure: Secondary | ICD-10-CM | POA: Diagnosis not present

## 2019-09-09 DIAGNOSIS — J1282 Pneumonia due to coronavirus disease 2019: Secondary | ICD-10-CM | POA: Diagnosis present

## 2019-09-09 DIAGNOSIS — R001 Bradycardia, unspecified: Secondary | ICD-10-CM | POA: Diagnosis not present

## 2019-09-09 DIAGNOSIS — Z7902 Long term (current) use of antithrombotics/antiplatelets: Secondary | ICD-10-CM

## 2019-09-09 DIAGNOSIS — E785 Hyperlipidemia, unspecified: Secondary | ICD-10-CM | POA: Diagnosis present

## 2019-09-09 DIAGNOSIS — Z981 Arthrodesis status: Secondary | ICD-10-CM

## 2019-09-09 DIAGNOSIS — R7402 Elevation of levels of lactic acid dehydrogenase (LDH): Secondary | ICD-10-CM | POA: Diagnosis present

## 2019-09-09 DIAGNOSIS — J9601 Acute respiratory failure with hypoxia: Secondary | ICD-10-CM | POA: Diagnosis present

## 2019-09-09 DIAGNOSIS — K219 Gastro-esophageal reflux disease without esophagitis: Secondary | ICD-10-CM | POA: Diagnosis present

## 2019-09-09 DIAGNOSIS — R7401 Elevation of levels of liver transaminase levels: Secondary | ICD-10-CM | POA: Diagnosis present

## 2019-09-09 DIAGNOSIS — N179 Acute kidney failure, unspecified: Secondary | ICD-10-CM | POA: Diagnosis not present

## 2019-09-09 DIAGNOSIS — E871 Hypo-osmolality and hyponatremia: Secondary | ICD-10-CM | POA: Diagnosis present

## 2019-09-09 DIAGNOSIS — Z801 Family history of malignant neoplasm of trachea, bronchus and lung: Secondary | ICD-10-CM

## 2019-09-09 DIAGNOSIS — N1831 Chronic kidney disease, stage 3a: Secondary | ICD-10-CM | POA: Diagnosis present

## 2019-09-09 DIAGNOSIS — Z96643 Presence of artificial hip joint, bilateral: Secondary | ICD-10-CM | POA: Diagnosis present

## 2019-09-09 DIAGNOSIS — I480 Paroxysmal atrial fibrillation: Secondary | ICD-10-CM | POA: Diagnosis present

## 2019-09-09 DIAGNOSIS — Z800 Family history of malignant neoplasm of digestive organs: Secondary | ICD-10-CM

## 2019-09-09 DIAGNOSIS — Z794 Long term (current) use of insulin: Secondary | ICD-10-CM

## 2019-09-09 LAB — COMPREHENSIVE METABOLIC PANEL
ALT: 36 U/L (ref 0–44)
ALT: 33 U/L (ref 0–44)
AST: 44 U/L — ABNORMAL HIGH (ref 15–41)
AST: 44 U/L — ABNORMAL HIGH (ref 15–41)
Albumin: 3.1 g/dL — ABNORMAL LOW (ref 3.5–5.0)
Albumin: 3.1 g/dL — ABNORMAL LOW (ref 3.5–5.0)
Alkaline Phosphatase: 58 U/L (ref 38–126)
Alkaline Phosphatase: 55 U/L (ref 38–126)
Anion gap: 13 (ref 5–15)
Anion gap: 13 (ref 5–15)
BUN: 37 mg/dL — ABNORMAL HIGH (ref 8–23)
BUN: 39 mg/dL — ABNORMAL HIGH (ref 8–23)
CO2: 25 mmol/L (ref 22–32)
CO2: 27 mmol/L (ref 22–32)
Calcium: 9.1 mg/dL (ref 8.9–10.3)
Calcium: 8.8 mg/dL — ABNORMAL LOW (ref 8.9–10.3)
Chloride: 94 mmol/L — ABNORMAL LOW (ref 98–111)
Chloride: 91 mmol/L — ABNORMAL LOW (ref 98–111)
Creatinine, Ser: 1.27 mg/dL — ABNORMAL HIGH (ref 0.61–1.24)
Creatinine, Ser: 1.61 mg/dL — ABNORMAL HIGH (ref 0.61–1.24)
GFR calc Af Amer: >60 mL/min (ref >60)
GFR calc Af Amer: 48 mL/min — ABNORMAL LOW (ref >60)
GFR calc non Af Amer: 55 mL/min — ABNORMAL LOW (ref >60)
GFR calc non Af Amer: 41 mL/min — ABNORMAL LOW (ref >60)
Glucose, Bld: 231 mg/dL — ABNORMAL HIGH (ref 70–99)
Glucose, Bld: 190 mg/dL — ABNORMAL HIGH (ref 70–99)
Potassium: 4.2 mmol/L (ref 3.5–5.1)
Potassium: 3.8 mmol/L (ref 3.5–5.1)
Sodium: 132 mmol/L — ABNORMAL LOW (ref 135–145)
Sodium: 131 mmol/L — ABNORMAL LOW (ref 135–145)
Total Bilirubin: 0.8 mg/dL (ref 0.3–1.2)
Total Bilirubin: 0.9 mg/dL (ref 0.3–1.2)
Total Protein: 7.8 g/dL (ref 6.5–8.1)
Total Protein: 7.1 g/dL (ref 6.5–8.1)

## 2019-09-09 LAB — CBC WITH DIFFERENTIAL/PLATELET
Abs Immature Granulocytes: 0.14 10*3/uL — ABNORMAL HIGH (ref 0.00–0.07)
Abs Immature Granulocytes: 0.09 10*3/uL — ABNORMAL HIGH (ref 0.00–0.07)
Basophils Absolute: 0 10*3/uL (ref 0.0–0.1)
Basophils Absolute: 0 10*3/uL (ref 0.0–0.1)
Basophils Relative: 0 %
Basophils Relative: 0 %
Eosinophils Absolute: 0.1 10*3/uL (ref 0.0–0.5)
Eosinophils Absolute: 0.5 10*3/uL (ref 0.0–0.5)
Eosinophils Relative: 1 %
Eosinophils Relative: 5 %
HCT: 38.9 % — ABNORMAL LOW (ref 39.0–52.0)
HCT: 39.7 % (ref 39.0–52.0)
Hemoglobin: 13.6 g/dL (ref 13.0–17.0)
Hemoglobin: 13.7 g/dL (ref 13.0–17.0)
Immature Granulocytes: 2 %
Immature Granulocytes: 1 %
Lymphocytes Relative: 8 %
Lymphocytes Relative: 18 %
Lymphs Abs: 0.8 10*3/uL (ref 0.7–4.0)
Lymphs Abs: 1.7 10*3/uL (ref 0.7–4.0)
MCH: 34.2 pg — ABNORMAL HIGH (ref 26.0–34.0)
MCH: 34.3 pg — ABNORMAL HIGH (ref 26.0–34.0)
MCHC: 35 g/dL (ref 30.0–36.0)
MCHC: 34.5 g/dL (ref 30.0–36.0)
MCV: 97.7 fL (ref 80.0–100.0)
MCV: 99.3 fL (ref 80.0–100.0)
Monocytes Absolute: 0.3 10*3/uL (ref 0.1–1.0)
Monocytes Absolute: 0.6 10*3/uL (ref 0.1–1.0)
Monocytes Relative: 3 %
Monocytes Relative: 7 %
Neutro Abs: 8.1 10*3/uL — ABNORMAL HIGH (ref 1.7–7.7)
Neutro Abs: 6.7 10*3/uL (ref 1.7–7.7)
Neutrophils Relative %: 86 %
Neutrophils Relative %: 69 %
Platelets: 453 10*3/uL — ABNORMAL HIGH (ref 150–400)
Platelets: 468 10*3/uL — ABNORMAL HIGH (ref 150–400)
RBC: 3.98 MIL/uL — ABNORMAL LOW (ref 4.22–5.81)
RBC: 4 MIL/uL — ABNORMAL LOW (ref 4.22–5.81)
RDW: 12.8 % (ref 11.5–15.5)
RDW: 13.1 % (ref 11.5–15.5)
WBC: 9.4 10*3/uL (ref 4.0–10.5)
WBC: 9.6 10*3/uL (ref 4.0–10.5)
nRBC: 0 % (ref 0.0–0.2)
nRBC: 0.2 % (ref 0.0–0.2)

## 2019-09-09 LAB — FIBRIN DERIVATIVES D-DIMER (ARMC ONLY): Fibrin derivatives D-dimer (ARMC): 618.11 ng/mL (FEU) — ABNORMAL HIGH (ref 0.00–499.00)

## 2019-09-09 LAB — ABO/RH: ABO/RH(D): A POS

## 2019-09-09 LAB — TROPONIN I (HIGH SENSITIVITY): Troponin I (High Sensitivity): 4 ng/L (ref <18)

## 2019-09-09 LAB — PHOSPHORUS: Phosphorus: 2.9 mg/dL (ref 2.5–4.6)

## 2019-09-09 LAB — BRAIN NATRIURETIC PEPTIDE: B Natriuretic Peptide: 91.3 pg/mL (ref 0.0–100.0)

## 2019-09-09 LAB — PROCALCITONIN: Procalcitonin: <0.1 ng/mL

## 2019-09-09 LAB — CULTURE, BLOOD (ROUTINE X 2)
Culture: NO GROWTH
Culture: NO GROWTH
Special Requests: ADEQUATE
Special Requests: ADEQUATE

## 2019-09-09 LAB — GLUCOSE, CAPILLARY
Glucose-Capillary: 189 mg/dL — ABNORMAL HIGH (ref 70–99)
Glucose-Capillary: 227 mg/dL — ABNORMAL HIGH (ref 70–99)
Glucose-Capillary: 177 mg/dL — ABNORMAL HIGH (ref 70–99)
Glucose-Capillary: 223 mg/dL — ABNORMAL HIGH (ref 70–99)
Glucose-Capillary: 168 mg/dL — ABNORMAL HIGH (ref 70–99)

## 2019-09-09 LAB — C-REACTIVE PROTEIN
CRP: 1.9 mg/dL — ABNORMAL HIGH (ref <1.0)
CRP: 2.5 mg/dL — ABNORMAL HIGH (ref <1.0)

## 2019-09-09 LAB — D-DIMER, QUANTITATIVE: D-Dimer, Quant: 0.83 ug/mL-FEU — ABNORMAL HIGH (ref 0.00–0.50)

## 2019-09-09 LAB — LACTATE DEHYDROGENASE: LDH: 307 U/L — ABNORMAL HIGH (ref 98–192)

## 2019-09-09 LAB — FERRITIN
Ferritin: 248 ng/mL (ref 24–336)
Ferritin: 207 ng/mL (ref 24–336)

## 2019-09-09 LAB — MAGNESIUM: Magnesium: 2 mg/dL (ref 1.7–2.4)

## 2019-09-09 MED ORDER — SODIUM CHLORIDE 0.9 % IV SOLN: 200.0000 mg | Freq: Once | INTRAVENOUS | Status: DC

## 2019-09-09 MED ORDER — INSULIN DETEMIR 100 UNIT/ML ~~LOC~~ SOLN
17.0000 [IU] | Freq: Two times a day (BID) | SUBCUTANEOUS | Status: DC
  Filled 2019-09-09 (×2): qty 0.17

## 2019-09-09 MED ORDER — ZINC SULFATE 220 (50 ZN) MG PO CAPS
220.0000 mg | ORAL_CAPSULE | Freq: Every day | ORAL | Status: DC
  Administered 2019-09-10 – 2019-09-25 (×16): 220 mg via ORAL
  Filled 2019-09-09 (×17): qty 1

## 2019-09-09 MED ORDER — SODIUM CHLORIDE 0.9% FLUSH
3.0000 mL | Freq: Two times a day (BID) | INTRAVENOUS | Status: DC
  Administered 2019-09-09 – 2019-09-25 (×32): 3 mL via INTRAVENOUS

## 2019-09-09 MED ORDER — OXYCODONE HCL 5 MG PO TABS: 5.0000 mg | ORAL_TABLET | ORAL | Status: DC | PRN

## 2019-09-09 MED ORDER — FOLIC ACID 1 MG PO TABS
1.0000 mg | ORAL_TABLET | Freq: Every day | ORAL | Status: DC
  Administered 2019-09-10 – 2019-09-25 (×16): 1 mg via ORAL
  Filled 2019-09-09 (×16): qty 1

## 2019-09-09 MED ORDER — ONDANSETRON HCL 4 MG/2ML IJ SOLN
4.0000 mg | Freq: Four times a day (QID) | INTRAMUSCULAR | Status: DC | PRN
  Administered 2019-09-13: 14:00:00 4 mg via INTRAVENOUS
  Filled 2019-09-09: qty 2

## 2019-09-09 MED ORDER — DEXAMETHASONE 6 MG PO TABS
6.0000 mg | ORAL_TABLET | ORAL | Status: AC
  Administered 2019-09-09 – 2019-09-10 (×2): 6 mg via ORAL
  Filled 2019-09-09 (×2): qty 1

## 2019-09-09 MED ORDER — ACETAMINOPHEN 325 MG PO TABS: 650.0000 mg | ORAL_TABLET | Freq: Four times a day (QID) | ORAL | Status: DC | PRN

## 2019-09-09 MED ORDER — ASPIRIN EC 81 MG PO TBEC
81.0000 mg | DELAYED_RELEASE_TABLET | Freq: Every day | ORAL | Status: DC
  Administered 2019-09-10 – 2019-09-25 (×16): 81 mg via ORAL
  Filled 2019-09-09 (×16): qty 1

## 2019-09-09 MED ORDER — SODIUM CHLORIDE 0.9 % IV SOLN: 100.0000 mg | Freq: Every day | INTRAVENOUS | Status: DC

## 2019-09-09 MED ORDER — ONDANSETRON HCL 4 MG PO TABS: 4.0000 mg | ORAL_TABLET | Freq: Four times a day (QID) | ORAL | Status: DC | PRN

## 2019-09-09 MED ORDER — DOCUSATE SODIUM 100 MG PO CAPS
100.0000 mg | ORAL_CAPSULE | Freq: Two times a day (BID) | ORAL | Status: DC
  Administered 2019-09-09 – 2019-09-24 (×26): 100 mg via ORAL
  Filled 2019-09-09 (×28): qty 1

## 2019-09-09 MED ORDER — THIAMINE HCL 100 MG PO TABS
100.0000 mg | ORAL_TABLET | Freq: Every day | ORAL | Status: DC
  Administered 2019-09-09 – 2019-09-25 (×17): 100 mg via ORAL
  Filled 2019-09-09 (×17): qty 1

## 2019-09-09 MED ORDER — GUAIFENESIN-DM 100-10 MG/5ML PO SYRP
10.0000 mL | ORAL_SOLUTION | ORAL | Status: DC | PRN
  Administered 2019-09-13 – 2019-09-19 (×2): 10 mL via ORAL
  Filled 2019-09-09 (×2): qty 10

## 2019-09-09 MED ORDER — ENOXAPARIN SODIUM 40 MG/0.4ML ~~LOC~~ SOLN
40.0000 mg | SUBCUTANEOUS | Status: DC
  Administered 2019-09-10 – 2019-09-25 (×16): 40 mg via SUBCUTANEOUS
  Filled 2019-09-09 (×16): qty 0.4

## 2019-09-09 MED ORDER — ASCORBIC ACID 500 MG PO TABS
500.0000 mg | ORAL_TABLET | Freq: Every day | ORAL | Status: DC
  Administered 2019-09-09 – 2019-09-25 (×17): 500 mg via ORAL
  Filled 2019-09-09 (×17): qty 1

## 2019-09-09 MED ORDER — HYDRALAZINE HCL 50 MG PO TABS: 50.0000 mg | ORAL_TABLET | Freq: Three times a day (TID) | ORAL | Status: DC

## 2019-09-09 NOTE — Progress Notes (Signed)
PROGRESS NOTE    Chase Cabrera  ACZ:[id number] DOB: 05-28-1946 DOA: 09/04/2019  PCP: McLean-Scocuzza, Nino Glow, MD    LOS - 5   Brief Narrative:  73 y.o.malewith a history ofCHF, coronary artery disease, Crohn's disease, type 2 diabetes mellitus, GERD, hypertension, gout and dyslipidemia, presented to the emergency room with acute onset ofshortness of breath with associated generalized weakness, cough for about 4 days. Started with fever 2 days prior, and non-bloody diarrhea. Shortness of breath progressively worsened. Of note, patient lives with grandson who had recently tested positive for Covid-19. In the ED. Temp 102F, intermittently tachypneic, spO2 90% on RA, improved on 2L/min O2 by [place]. Had transient hypotension 81/48, improved with fluids. Labs notable for no leukocytosis but mild lymphocytopenia, mild hyponatremia and hypokalemia, AKI with Cr 1.90, mildly elevated AST 81 and ALT 50, BNP mildly elevated 227, troponin 24 ->26 ->14, ferritin normal, LDH elevated 212, d-dimer elvated 561 -->589. Rapid antigen Covid-19 positive, confirmed by positive PCR. Chest xray showed pulmonary vascular congestion, however does appear concerning for multifocal pneumonia on personal review. Patient is admitted for acute hypoxic respiratory failure due to Covid-19 pneumonia, being treated with Remdesivir, Decadron, vitamins C and D, and zinc.  Oxygen requirements increased overnight 12/24, required 10 L/min HFNC, night coverage provider gave Actemra.  Morning of 12/26 spO2 87% on 11 L/min HFNC.  On [place] admit list as of 12/27 AM, transfer pending.  Subjective 12/26: Patient continues to require 14 L/min HFNC to maintain O2 sat above 90%.  Alert and oriented to name only.  Assessment & Plan:   Principal Problem:   COVID-19 Active Problems:   CAD (coronary artery disease)   DM type 2 with diabetic peripheral neuropathy (HCC)   Gout   Hyperlipidemia   Essential hypertension  Crohn's disease ([place])   Hypokalemia   Acute respiratory failure with hypoxia (HCC)   AKI (acute kidney injury) ([place])   GERD (gastroesophageal reflux disease)   Hyponatremia   Pneumonia due to COVID-19 virus   Severe Sepsis due to Covid-19 - present on admission.  As evidenced by fever, tachypnea, hypoxia, and AKI in setting of Covid-19 pneumonia Acute respiratory failure with hypoxiasecondary to COVID-19 multifocal pneumonia --12/25: received Actemra overnight for increased oxygen requirement up to 10L from 3L. --As of 12/26 now up to 14 L/min to get to 90% --supplemental oxygen, maintain O2 sat > 90% --maintain airborne and contact precautions --Complete remdesivir course per pharmacy - last day 12/27 --continue IV Decadron --continue vitamin C, zinc, vitamin D --continue Mucinex, Tussionex PRN cough --continue Atrovent inhaler -- status post antibiotics that was started on admission; pro-Cal found to be negative --monitor inflammatory markers CRP, ferritin, d-dimer --[facility] transfer pending as of 12/27 AM - on the admit list.  We contacted [facility] today; will contact the unit with ETA  Acute Kidney Injury- Improved to baseline with hydration.  IV fluids stopped. Likely prerenal azotemia in setting of dehydration. -- resumed diuretic but had to hold on 12/26 due to low BP, monitor BP with this (ordered 45m daily, takes 887mevery other day at home) --monitor BMP daily --avoid nephrotoxic agents, renally dose meds as indicated  Hyponatremia -  resolved with IV fluid  Hypokalemia -  Repleated and Resolved.  DM type 2 with diabetic peripheral neuropathy Home regimen is Tr[place]ua and Barbudand Humalog sliding scale insulin.  --basal coverage: Levemir 17 units BID, titrate up as needed --resistant sliding scale TID with meals --continue gabapentin  Essential hypertension- chronic, stable. Had transient  hypotension --continue home Norvasc, clonidine, Coreg and hydralazine (reduced  dose) - May need further dose adjustment   CAD (coronary artery disease)- stable --continue ASA, Plavix, Coreg, Imdur, PRN nitroglycerin SL  Gout --continue colchicine and probenacid  Hyperlipidemia --continue Pravachol  Crohn's disease- appears stable  GERD- continue home PPI   DVT prophylaxis:Lovenox Code Status: Full Code Family Communication:wife updated by phone Disposition Plan:When stable; Pending transfer of care to [facility] upon bed availability   Consultants:  None  Procedures:  None  Antimicrobials:  Rocephin and Azithromycin 12/22-12/23, started empirically on admission, procal later negative, stopped   Objective: Vitals:   09/09/19 0443 09/09/19 0805 09/09/19 1326 09/09/19 1331  BP: (!) 137/58 (!) 137/57 117/71 117/71  Pulse:  (!) 56  65  Resp: 20     Temp: 98.3 F (36.8 C) 98 F (36.7 C)  97.8 F (36.6 C)  TempSrc: Oral Oral  Oral  SpO2: 90% 94%  93%  Weight:      Height:        Intake/Output Summary (Last 24 hours) at 09/09/2019 1420 Last data filed at 09/09/2019 0830 Gross per 24 hour  Intake 361.03 ml  Output 2900 ml  Net -2538.97 ml   Filed Weights   09/04/19 1822  Weight: 87.1 kg    Examination:  General exam: awake, alert, no acute distress HEENT: moist mucus membranes, hearing impairment Respiratory system: clear to auscultation bilaterally, no wheezes, rales or rhonchi, normal respiratory effort. Cardiovascular system: normal S1/S2, RRR, no JVD, murmurs, rubs, gallops, no pedal edema.   Central nervous system: alert and oriented x3. no gross focal neurologic deficits, normal speech Extremities: moves all, no cyanosis, normal tone Skin: dry, intact, normal temperature Psychiatry: normal mood, congruent affect    Data Reviewed: I have personally reviewed following labs and imaging studies  CBC: Recent Labs  Lab 09/05/19 0737 09/06/19 0628 09/07/19 0513 09/08/19 0404 09/09/19 0706  WBC 6.2  7.9 5.0 7.2 9.4  NEUTROABS 5.3 7.0 4.2 6.1 8.1*  HGB 11.9* 12.2* 12.2* 12.3* 13.6  HCT 35.1* 34.2* 34.3* 35.6* 38.9*  MCV 98.9 93.2 94.0 98.1 97.7  PLT 266 302 325 375 161*   Basic Metabolic Panel: Recent Labs  Lab 09/04/19 2028 09/05/19 0737 09/06/19 0628 09/07/19 0513 09/08/19 0404 09/09/19 0706  NA  --  133* 136 136 134* 132*  K  --  3.3* 3.3* 4.2 4.4 4.2  CL  --  96* 99 100 97* 94*  CO2  --  20* 25 24 27 25   GLUCOSE  --  315* 161* 231* 184* 231*  BUN  --  43* 37* 36* 34* 37*  CREATININE  --  1.71* 1.40* 1.37* 1.28* 1.27*  CALCIUM  --  8.1* 8.3* 8.4* 8.7* 9.1  MG 1.9  --   --   --   --   --    GFR: Estimated Creatinine Clearance: 54 mL/min (A) (by C-G formula based on SCr of 1.27 mg/dL (H)). Liver Function Tests: Recent Labs  Lab 09/05/19 0737 09/06/19 0628 09/07/19 0513 09/08/19 0404 09/09/19 0706  AST 66* 49* 41 42* 44*  ALT 44 37 33 29 36  ALKPHOS 44 44 45 48 58  BILITOT 1.1 0.5 0.7 0.7 0.8  PROT 7.5 7.5 7.3 7.2 7.8  ALBUMIN 3.0* 3.0* 2.9* 2.8* 3.1*   No results for input(s): LIPASE, AMYLASE in the last 168 hours. No results for input(s): AMMONIA in the last 168 hours. Coagulation Profile: No results for input(s): INR, PROTIME in  the last 168 hours. Cardiac Enzymes: No results for input(s): CKTOTAL, CKMB, CKMBINDEX, TROPONINI in the last 168 hours. BNP (last 3 results) No results for input(s): PROBNP in the last 8760 hours. HbA1C: No results for input(s): HGBA1C in the last 72 hours. CBG: Recent Labs  Lab 09/08/19 2115 09/08/19 2315 09/09/19 0436 09/09/19 0736 09/09/19 1227  GLUCAP 232* 226* 189* 227* 177*   Lipid Profile: No results for input(s): CHOL, HDL, LDLCALC, TRIG, CHOLHDL, LDLDIRECT in the last 72 hours. Thyroid Function Tests: No results for input(s): TSH, T4TOTAL, FREET4, T3FREE, THYROIDAB in the last 72 hours. Anemia Panel: Recent Labs    09/08/19 0404 09/09/19 0706  FERRITIN 251 248   Sepsis Labs: Recent Labs  Lab 09/05/19  0737  PROCALCITON <0.10    Recent Results (from the past 240 hour(s))  CULTURE, BLOOD (ROUTINE X 2) w Reflex to ID Panel     Status: None   Collection Time: 09/04/19  6:38 PM   Specimen: BLOOD  Result Value Ref Range Status   Specimen Description BLOOD RIGHT ANTECUBITAL  Final   Special Requests   Final    BOTTLES DRAWN AEROBIC AND ANAEROBIC Blood Culture adequate volume   Culture   Final    NO GROWTH 5 DAYS Performed at [facility], [address] [place], [place] [address]    Report Status 09/09/2019 FINAL  Final  CULTURE, BLOOD (ROUTINE X 2) w Reflex to ID Panel     Status: None   Collection Time: 09/04/19  6:40 PM   Specimen: BLOOD  Result Value Ref Range Status   Specimen Description BLOOD LEFT ANTECUBITAL  Final   Special Requests   Final    BOTTLES DRAWN AEROBIC AND ANAEROBIC Blood Culture adequate volume   Culture   Final    NO GROWTH 5 DAYS Performed at [facility], [address], [place], [place] [address]    Report Status 09/09/2019 FINAL  Final         Radiology Studies: No results found.      Scheduled Meds: . amLODipine  5 mg Oral BID  . vitamin C  1,000 mg Oral Daily  . aspirin EC  81 mg Oral Daily  . carvedilol  25 mg Oral BID WC  . cholecalciferol  1,000 Units Oral Daily  . cloNIDine  0.2 mg Oral BID  . clopidogrel  75 mg Oral Daily  . colchicine  0.6 mg Oral Daily  . dexamethasone (DECADRON) injection  6 mg Intravenous Q24H  . dicyclomine  10 mg Oral TID AC  . enoxaparin (LOVENOX) injection  40 mg Subcutaneous Q24H  . famotidine  20 mg Oral BID  . fluticasone  2 spray Each Nare Daily  . furosemide  40 mg Oral Daily  . furosemide  80 mg Oral QODAY  . gabapentin  600 mg Oral TID  . hydrALAZINE  50 mg Oral Q8H  . insulin aspart  0-20 Units Subcutaneous Q4H  . insulin detemir  17 Units Subcutaneous BID  . isosorbide mononitrate  120 mg Oral Daily  . loratadine  10 mg Oral QPM  . montelukast  10 mg Oral QHS  .  multivitamin with minerals  1 tablet Oral Daily  . omega-3 acid ethyl esters  2 g Oral BID  . pantoprazole  40 mg Oral Daily  . pravastatin  40 mg Oral q1800  . probenecid  1,000 mg Oral BID  . sodium chloride flush  3 mL Intravenous Once  .  spironolactone  12.5 mg Oral Daily  . [START ON 09/10/2019] Vitamin D (Ergocalciferol)  50,000 Units Oral Weekly  . vitamin E  400 Units Oral BID  . zinc sulfate  220 mg Oral Daily   Continuous Infusions: . sodium chloride 1,000 mL (09/08/19 1138)     LOS: 5 days    Time spent: 35 minutes    Thornell Mule, DO [facility]   If 7PM-7AM, please contact night-coverage [email or web address] Password [email or web address] 09/09/2019, 2:20 PM

## 2019-09-09 NOTE — TOC Progression Note (Signed)
Transition of Care [facility]) - Progression Note    Patient Details  Name: HARNOOR KOHLES MRN: [id number] Date of Birth: 1946/03/08  Transition of Care [facility]) CM/SW Contact  Ross Ludwig,  Phone Number: 09/09/2019, 7:39 PM  Clinical Narrative:    Patient discharged to [facility] due to Covid+, this CSW signing off.  Corene Cornea from [facility] was given heads up that patient may need home health, once he is ready for discharge from hospital.   Expected Discharge Plan: Home/Self Care Barriers to Discharge: No Barriers Identified  Expected Discharge Plan and Services Expected Discharge Plan: Home/Self Care   Discharge Planning Services: CM Consult   Living arrangements for the past 2 months: Single Family Home Expected Discharge Date: 09/09/19                                     Social Determinants of Health (SDOH) Interventions    Readmission Risk Interventions Readmission Risk Prevention Plan 09/05/2019 09/05/2019  Transportation Screening Complete Complete  Medication Review Press photographer) Complete -  PCP or Specialist appointment within 3-5 days of discharge Complete -  SW Recovery Care/Counseling Consult Complete -  Palliative Care Screening Not Applicable -  Some recent data might be hidden

## 2019-09-09 NOTE — Progress Notes (Addendum)
18:21: Patient arrived to [facility]. No s/s of distress. VSS. Patient saturating @ 95% on 15L NRB.   1836: Spoke to [facility] physician answering system. Notified patient arrived to unit stated she will page [place] [facility].

## 2019-09-09 NOTE — Plan of Care (Signed)
The patient has been transfer to [facility]. The patient will be admitted to room 160. Report given to Nurse Caryl Pina. The patient has been transported via carelink. The patient has been on 14L of HFNC before being transfer. The patient was transfer with non-rebreather. Son to be informed of the patient being transfer to [facility] now that the patient has been picked up.   Problem: Education: Goal: Knowledge of General Education information will improve Description: Including pain rating scale, medication(s)/side effects and non-pharmacologic comfort measures Outcome: Not Progressing   Problem: Health Behavior/Discharge Planning: Goal: Ability to manage health-related needs will improve Outcome: Not Progressing   Problem: Clinical Measurements: Goal: Ability to maintain clinical measurements within normal limits will improve Outcome: Not Progressing Goal: Will remain free from infection Outcome: Not Progressing Goal: Diagnostic test results will improve Outcome: Not Progressing Goal: Respiratory complications will improve Outcome: Not Progressing Goal: Cardiovascular complication will be avoided Outcome: Not Progressing   Problem: Activity: Goal: Risk for activity intolerance will decrease Outcome: Not Progressing   Problem: Nutrition: Goal: Adequate nutrition will be maintained Outcome: Not Progressing   Problem: Coping: Goal: Level of anxiety will decrease Outcome: Not Progressing   Problem: Elimination: Goal: Will not experience complications related to bowel motility Outcome: Not Progressing Goal: Will not experience complications related to urinary retention Outcome: Not Progressing   Problem: Pain Managment: Goal: General experience of comfort will improve Outcome: Not Progressing   Problem: Safety: Goal: Ability to remain free from injury will improve Outcome: Not Progressing   Problem: Skin Integrity: Goal: Risk for impaired skin integrity will  decrease Outcome: Not Progressing

## 2019-09-09 NOTE — H&P (Signed)
TRH H&P   Patient Demographics:    Chase Cabrera, is a 73 y.o. male  MRN: [id number]   DOB - [birth year]  Admit Date - 09/09/2019  Outpatient Primary MD for the patient is McLean-Scocuzza, Nino Glow, MD  Patient coming from: [facility]  No chief complaint on file.     HPI:    Chase Cabrera  is a 73 y.o. male, with CHF, CAD, Crohn's disease, DMII, GERD, HTN, gout and HLD, presented as a transfer from [facility] where he presented on 12/22 with acute onset ofshortness of breath with associated generalized weakness, cough for about 4 days. Of note, patient lives with grandson who had recently tested positive for Covid-19. In the ED. Temp 102F, intermittently tachypneic, spO2 90% on RA, improved on 2L/min O2 by [place]. Had transient hypotension 81/48, improved with fluids. Labs notable for no leukocytosis but mild lymphocytopenia, mild hyponatremia and hypokalemia, AKI with Cr 1.90, mildly elevated AST 81 and ALT 50, BNP mildly elevated 227, troponin 24 ->26 ->14, ferritin normal, LDH elevated 212, d-dimer elvated 561 -->589. Rapid antigen Covid-19 positive, confirmed by positive PCR. Chest xray showed pulmonary vascular congestion. He was admitted for acute hypoxic respiratory failure due to Covid-19 pneumonia, and treated with Remdesivir, Decadron, vitamins C and D, and zinc.Oxygen requirements increased overnight 12/24, required 10 L/min HFNC, and Actemra was administered. Morning of 12/26 spO2 87% on 11 L/min HFNC.  Oxygen requirement is increased to NRB at the time of transfer.   Review of systems:  Review of Systems: Constitutional: negative for anorexia, chills, fatigue or fevers HEENT: negative for earaches, epistaxis,  or sore throat Respiratory: see HPI Cardiovascular: negative for chest pain, palpitations, or syncope GU: negative for dysuria, urinary frequency, urinary urgency, hematuria Gastrointestinal: negative for abdominal pain, constipation, diarrhea, nausea or vomiting Musculoskeletal: negative for arthralgias, back pain or myalgias Neurological: negative for dizziness, headaches or weakness Behavioral/Psych: negative for suicidal or homicidal ideation Skin:negative for rash Heme: negative for bruises Endo: negative for hair loss, weight gain/loss  With Past History of the following :   Past Medical History:  Diagnosis Date  . Arthritis   . Atrial fibrillation ([place])   . CAD (coronary artery disease)    6 STENTS  . CHF (congestive heart failure) ([place])   . Chicken pox   . Colon polyp   .  Corneal dystrophy    bilateral  . Crohn's disease ([place])   . Diabetes ([place]) 2002  . Dysrhythmia    A-FIB AND PAF  . GERD (gastroesophageal reflux disease)   . Gout   . History of kidney stones   . History of shingles   . Hyperlipemia   . Hypertension    2/2 b/l RAS >60% [place] 06/2018 s/p right stent   . Kidney stones   . Myocardial infarction ([place])    x4, last one in 2010  . Peripheral neuropathy   . Peripheral neuropathy   . Peripheral neuropathy   . PUD (peptic ulcer disease)   . Rectus sheath hematoma    02/18/18 8.9 x 4.5 cm then 02/19/18 7.9 x 4.5 cm   . Renal artery stenosis ([place])   . Renal artery stenosis ([place])   . Salzmann's nodular dystrophy 2012  . Sleep apnea   . Spinal stenosis       Past Surgical History:  Procedure Laterality Date  . ABLATION  2012  . APPLICATION VERTERBRAL DEFECT PROSTHETIC  05/01/2013  . ARTHRODESIS ANTERIOR LUMBAR SPINE  05/01/2013  . BACK SURGERY     lumbar fusion  . CARDIAC CATHETERIZATION    . CARDIAC ELECTROPHYSIOLOGY STUDY AND ABLATION    . CARDIAC SURGERY    . CARDIOVERSION    . CHOLECYSTECTOMY    . COLONOSCOPY WITH PROPOFOL N/A 04/09/2016    Completed for chronic diarrhea.  Few scattered diverticuli.  No mucosal lesions.  Surgeon: Lollie Sails, MD;  Location: [facility] ENDOSCOPY;  Service: Endoscopy;  Laterality: N/A;  . CORNEAL EYE SURGERY Bilateral 07/28/11    09/22/2011  . CORONARY ANGIOPLASTY    . CORONARY ANGIOPLASTY WITH STENT PLACEMENT  03/28/2015   Distal 80% to normal Stent, Dilation Balloon  . CORONARY ARTERY BYPASS GRAFT     4 VESSELS  . CORONARY/GRAFT ACUTE MI REVASCULARIZATION N/A 09/16/2018   Procedure: Coronary/Graft Acute MI Revascularization;  Surgeon: Isaias Cowman, MD;  Location: [place] CV [facility];  Service: Cardiovascular;  Laterality: N/A;  . EYE SURGERY     bilateral cataract 2017 Dr. Megan Mans  . foot and ankle repair Right 2005  . FRACTURE SURGERY     ANKLE PLATE AND SCREWS  . GALLBLADER    . JOINT REPLACEMENT     left total hip 11/11/15  . JOINT REPLACEMENT     right total hip 12/2017 Dr. Rudene Christians   . KNEE ARTHROSCOPY Right   . LEFT HEART CATH AND CORONARY ANGIOGRAPHY N/A 09/16/2018   Procedure: LEFT HEART CATH AND CORONARY ANGIOGRAPHY;  Surgeon: Isaias Cowman, MD;  Location: [place] [facility];  Service: Cardiovascular;  Laterality: N/A;  . LUMBAR SPINE FUSION ONE LEVEL  05/03/2013  . RENAL ARTERY STENT Right   . ROTATOR CUFF REPAIR Right 2006  . TONSILLECTOMY    . TOTAL HIP ARTHROPLASTY Left 11/11/2015   Procedure: TOTAL HIP ARTHROPLASTY ANTERIOR APPROACH;  Surgeon: Hessie Knows, MD;  Location: [facility] ORS;  Service: Orthopedics;  Laterality: Left;  . TOTAL HIP ARTHROPLASTY Right 12/13/2017   Procedure: TOTAL HIP ARTHROPLASTY ANTERIOR APPROACH;  Surgeon: Hessie Knows, MD;  Location: [facility] ORS;  Service: Orthopedics;  Laterality: Right;  . [place] INTRAVASCULAR STENT LEG  03/2015     Social History:     Social History   Tobacco Use  . Smoking status: Former Smoker    Packs/day: 1.00    Years: 3.00    Pack years: 3.00    Types: Cigarettes  Quit date: 06/30/1962    Years  since quitting: 57.2  . Smokeless tobacco: Former Systems developer    Types: Chew    Quit date: 12/05/1968  Substance Use Topics  . Alcohol use: No    Family History :   Family History  Problem Relation Age of Onset  . CVA Father   . Stroke Father   . Lung cancer Mother   . Arthritis Mother   . Stomach cancer Sister   . Colon cancer Sister   . Diabetes Brother     Home Medications:   Prior to Admission medications   Medication Sig Start Date End Date Taking? Authorizing Provider  amitriptyline (ELAVIL) 50 MG tablet Take 0.5 tablets (25 mg total) by mouth at bedtime as needed. 02/19/19  Yes McLean-Scocuzza, Nino Glow, MD  amLODipine (NORVASC) 5 MG tablet Take 1 tablet (5 mg total) by mouth 2 (two) times daily. If blood pressure >140/>90 make take another 1 pill of 5 mg 05/01/18  Yes Mayo, Pete Pelt, MD  aspirin EC 81 MG EC tablet Take 1 tablet (81 mg total) by mouth daily. 05/02/18  Yes Mayo, Pete Pelt, MD  carvedilol (COREG) 25 MG tablet Take 1 tablet (25 mg total) by mouth 2 (two) times daily with a meal. 03/27/19  Yes McLean-Scocuzza, Nino Glow, MD  Cholecalciferol 1.25 MG (50000 UT) TABS Take 1 tablet by mouth once a week. 07/12/19  Yes McLean-Scocuzza, Nino Glow, MD  cloNIDine (CATAPRES) 0.2 MG tablet Take 1 tablet (0.2 mg total) by mouth 2 (two) times daily. 07/11/19  Yes McLean-Scocuzza, Nino Glow, MD  clopidogrel (PLAVIX) 75 MG tablet Take 75 mg by mouth daily.   Yes [provider]  colchicine 0.6 MG tablet TAKE 1 TABLET BY MOUTH ONCE DAILY Patient taking differently: Take 0.6 mg by mouth daily.  07/12/17  Yes Leone Haven, MD  diclofenac sodium (VOLTAREN) 1 % GEL Apply 2 g topically 4 (four) times daily as needed (for pain).    Yes [provider]  dicyclomine (BENTYL) 10 MG capsule Take 10 mg by mouth 3 (three) times daily before meals.    Yes [provider]  fluticasone (FLONASE) 50 MCG/ACT nasal spray Place 2 sprays into both nostrils daily. 05/10/19  Yes  McLean-Scocuzza, Nino Glow, MD  furosemide (LASIX) 40 MG tablet Take 80 mg by mouth every other day.  09/22/18 09/22/19 Yes [provider]  gabapentin (NEURONTIN) 300 MG capsule take 2 capsules (620m) by mouth three times a day 01/16/18  Yes McLean-Scocuzza, TNino Glow MD  hydrALAZINE (APRESOLINE) 100 MG tablet TAKE 100 MG BY MOUTH THREE TIMES A DAY 01/02/19  Yes McLean-Scocuzza, TNino Glow MD  insulin lispro (HUMALOG) 100 UNIT/ML KiwkPen Inject 14-32 Units into the skin 3 (three) times daily. (according to sliding scale - max 96u over 24 hours)   Yes [provider]  ipratropium (ATROVENT) 0.06 % nasal spray Place 2 sprays into both nostrils 4 (four) times daily. 02/09/18  Yes McLean-Scocuzza, TNino Glow MD  isosorbide mononitrate (IMDUR) 120 MG 24 hr tablet TAKE 1 TABLET BY MOUTH EVERY DAY Patient taking differently: Take 120 mg by mouth daily.  10/07/17  Yes McLean-Scocuzza, TNino Glow MD  levocetirizine (XYZAL) 5 MG tablet Take 1 tablet (5 mg total) by mouth every evening. 02/09/18  Yes McLean-Scocuzza, TNino Glow MD  montelukast (SINGULAIR) 10 MG tablet Take 1 tablet (10 mg total) by mouth at bedtime. 07/03/19  Yes McLean-Scocuzza, TNino Glow MD  Multiple Vitamin (MULTI-VITAMINS) TABS Take  1 tablet by mouth daily. 04/15/09  Yes [provider]  nitroGLYCERIN (NITROSTAT) 0.4 MG SL tablet Place 1 tablet (0.4 mg total) under the tongue every 5 (five) minutes x 3 doses as needed. For chest pain.  Call 911 if no relief after 3 tablets 10/03/17 11/02/21 Yes McLean-Scocuzza, Nino Glow, MD  Olopatadine HCl 0.6 % SOLN Place 2 sprays into the nose 2 (two) times daily. 06/11/19  Yes Tyler Pita, MD  omega-3 acid ethyl esters (LOVAZA) 1 g capsule Take 2 capsules (2 g total) by mouth 2 (two) times daily. 05/12/18  Yes McLean-Scocuzza, Nino Glow, MD  omeprazole (PRILOSEC) 40 MG capsule Take 40 mg by mouth 2 (two) times daily.    Yes [provider]  pravastatin (PRAVACHOL) 40 MG tablet Take 1  tablet (40 mg total) by mouth daily. 03/28/19  Yes McLean-Scocuzza, Nino Glow, MD  probenecid (BENEMID) 500 MG tablet Take 1,000 mg by mouth 2 (two) times daily. 09/03/19  Yes [provider]  sodium chloride HYPERTONIC 3 % nebulizer solution Take by nebulization 2 (two) times daily. 06/14/18  Yes Juanito Doom, MD  spironolactone (ALDACTONE) 25 MG tablet Take 12.5 mg by mouth daily.  09/23/18 09/23/19 Yes [provider]  TRESIBA FLEXTOUCH 200 UNIT/ML SOPN Inject 90 Units into the skin at bedtime. 04/25/18  Yes [provider]  vitamin C (ASCORBIC ACID) 500 MG tablet Take 500 mg by mouth daily.    Yes [provider]  Vitamin E 400 units TABS Take 400 Units by mouth 2 (two) times daily.   Yes [provider]    Allergies:    Allergies  Allergen Reactions  . Allopurinol Diarrhea, Other (See Comments) and Nausea And Vomiting    Other Reaction: GI Upset  . Atenolol Other (See Comments)    Other Reaction: bradycardia  . Atorvastatin Other (See Comments)    Other Reaction: muscle aches  . Ramipril Rash  . Rosuvastatin Other (See Comments)    Muscle aches  . Simvastatin Other (See Comments)    Other Reaction: MUSCLE ACHES (Zocor)  . Valsartan Other (See Comments)    Other Reaction: facial swelling  . Cardizem [Diltiazem] Rash    Physical Exam:  Vitals  Blood pressure 131/67, pulse 62, temperature (!) 97.5 F (36.4 C), temperature source Oral, resp. rate (!) 30, height 5' 8"  (1.727 m), weight 82.1 kg, SpO2 93 %.  Physical Exam   Constitutional - resting comfortably, no acute distress Eyes - pupils equal round and reactive to light and accomodation, extra ocular movements intact Nose - no gross deformity or drainage Mouth - no oral lesions noted Throat - no swelling or erythema Neck - supple, no JVD   CV - (+)S1S2, no murmurs  Resp -scattered crackles in the bases,  GI - (+)BS, soft, non-tender, non-distended Extrem - no clubbing,  cyanosis, or peripheral edema  Skin - no rashes or wounds Neuro - alert, aware, oriented to person/place/time  Psych - normal affect, no anxiety   Patient has Pressure Ulcer on Admission?: no   Data Review:    CBC Recent Labs  Lab 09/05/19 0737 09/06/19 0628 09/07/19 0513 09/08/19 0404 09/09/19 0706  WBC 6.2 7.9 5.0 7.2 9.4  HGB 11.9* 12.2* 12.2* 12.3* 13.6  HCT 35.1* 34.2* 34.3* 35.6* 38.9*  PLT 266 302 325 375 453*  MCV 98.9 93.2 94.0 98.1 97.7  MCH 33.5 33.2 33.4 33.9 34.2*  MCHC 33.9 35.7 35.6 34.6 35.0  RDW 12.9 12.7 13.0  12.9 12.8  LYMPHSABS 0.6* 0.6* 0.6* 0.7 0.8  MONOABS 0.2 0.4 0.2 0.3 0.3  EOSABS 0.0 0.0 0.0 0.0 0.1  BASOSABS 0.0 0.0 0.0 0.0 0.0   ------------------------------------------------------------------------------------------------------------------  Chemistries  Recent Labs  Lab 09/04/19 2028 09/05/19 0737 09/06/19 0628 09/07/19 0513 09/08/19 0404 09/09/19 0706  NA  --  133* 136 136 134* 132*  K  --  3.3* 3.3* 4.2 4.4 4.2  CL  --  96* 99 100 97* 94*  CO2  --  20* 25 24 27 25   GLUCOSE  --  315* 161* 231* 184* 231*  BUN  --  43* 37* 36* 34* 37*  CREATININE  --  1.71* 1.40* 1.37* 1.28* 1.27*  CALCIUM  --  8.1* 8.3* 8.4* 8.7* 9.1  MG 1.9  --   --   --   --   --   AST  --  66* 49* 41 42* 44*  ALT  --  44 37 33 29 36  ALKPHOS  --  44 44 45 48 58  BILITOT  --  1.1 0.5 0.7 0.7 0.8   ------------------------------------------------------------------------------------------------------------------ estimated creatinine clearance is 52.5 mL/min (A) (by C-G formula based on SCr of 1.27 mg/dL (H)). ------------------------------------------------------------------------------------------------------------------ No results for input(s): TSH, T4TOTAL, T3FREE, THYROIDAB in the last 72 hours.  Invalid input(s): FREET3  Coagulation profile No results for input(s): INR, PROTIME in the last 168  hours. ------------------------------------------------------------------------------------------------------------------- No results for input(s): DDIMER in the last 72 hours. -------------------------------------------------------------------------------------------------------------------  Cardiac Enzymes No results for input(s): CKMB, TROPONINI, MYOGLOBIN in the last 168 hours.  Invalid input(s): CK ------------------------------------------------------------------------------------------------------------------    Component Value Date/Time   BNP 227.0 (H) 09/04/2019 1837     ---------------------------------------------------------------------------------------------------------------  Urinalysis    Component Value Date/Time   COLORURINE YELLOW (A) 06/13/2018 1551   APPEARANCEUR CLEAR (A) 06/13/2018 1551   APPEARANCEUR Cloudy (A) 01/31/2018 1321   LABSPEC 1.018 06/13/2018 1551   PHURINE 5.0 06/13/2018 1551   GLUCOSEU NEGATIVE 06/13/2018 1551   HGBUR NEGATIVE 06/13/2018 1551   BILIRUBINUR NEGATIVE 06/13/2018 1551   BILIRUBINUR Negative 01/31/2018 1321   KETONESUR NEGATIVE 06/13/2018 1551   PROTEINUR NEGATIVE 06/13/2018 1551   NITRITE NEGATIVE 06/13/2018 1551   LEUKOCYTESUR NEGATIVE 06/13/2018 1551   LEUKOCYTESUR Negative 01/31/2018 1321    ----------------------------------------------------------------------------------------------------------------   Imaging Results:    No results found.   Assessment & Plan:    Principal Problem:   Acute respiratory failure with hypoxia (HCC) Active Problems:   Pneumonia due to COVID-19 virus   Atrial fibrillation (HCC)   DM type 2 with diabetic peripheral neuropathy (HCC)   Hyperlipidemia   Essential hypertension   BPH (benign prostatic hyperplasia)    Acute respiratory failure with hypoxia/Pneumonia due to COVID-19 virus: Patient was admitted 12/22 to [facility] with respiratory failure with hypoxia secondary to COVID-19  infection.  Transfer for further management of increasing oxygen demand. Date of Dx: 12/22 Oxygen requirements: NRB, wean as tolerated Antibiotics: Received antibioticson admission;pro-Cal found to be negative Diuretics: Not indicated at this time, follow-up BNP Vitamin C and Zinc: Per protocol Remdesivir: Started on 12/22 finished on 12/27  Steroids: Started on 12/22 Actemra:  1 dose administered 12/25 Convalescent Plasma: Not given yet     Atrial fibrillation: Rate is controlled.  Continue to monitor.    DM type 2 with diabetic peripheral neuropathy: Diabetic diet.  Fingersticks before meals and at bedtime.  Takes 90 units of Tresiba daily, will start at 30 units twice daily and titrate as necessary.  Sliding scale insulin.    Hyperlipidemia: Cardiac diet.  Continue Pravachol.    Essential hypertension: Blood pressures controlled.  Continue Norvasc, clonidine, Coreg, hydralazine.  Titrate as necessary.    BPH: DVT Prophylaxis Lovenox  AM Labs Ordered, also please review Full Orders  Family Communication: Admission, patients condition and plan of care including tests being ordered have been discussed with the patient who indicate understanding and agree with the plan and Code Status.  Code Status Full  Likely DC to home  Condition GUARDED    Consults called: None  Admission status: Admit to inpatient  Time spent in minutes : 60   Peyton Bottoms M.D on 09/09/2019 at 7:45 PM  To page go to [email or web address] - password [facility]

## 2019-09-09 NOTE — Discharge Summary (Signed)
Physician Discharge Summary  Patient ID: Chase Cabrera MRN: [id number] DOB/AGE: 1946/07/31 73 y.o.  Admit date: 09/04/2019 Discharge date: 09/09/2019  Admission Diagnoses:  Discharge Diagnoses:  Principal Problem:   COVID-19 Active Problems:   CAD (coronary artery disease)   DM type 2 with diabetic peripheral neuropathy (HCC)   Gout   Hyperlipidemia   Essential hypertension   Crohn's disease ([place])   Hypokalemia   Acute respiratory failure with hypoxia (HCC)   AKI (acute kidney injury) ([place])   GERD (gastroesophageal reflux disease)   Hyponatremia   Pneumonia due to COVID-19 virus   Discharged Condition: fair  Hospital Course:  73 y.o.malewith a history ofCHF, coronary artery disease, Crohn's disease, type 2 diabetes mellitus, GERD, hypertension, gout and dyslipidemia, presented to the emergency room with acute onset ofshortness of breath with associated generalized weakness, cough for about 4 days. Started with fever 2 days prior, and non-bloody diarrhea. Shortness of breath progressively worsened. Of note, patient lives with grandson who had recently tested positive for Covid-19. In the ED. Temp 102F, intermittently tachypneic, spO2 90% on RA, improved on 2L/min O2 by [place]. Had transient hypotension 81/48, improved with fluids. Labs notable for no leukocytosis but mild lymphocytopenia, mild hyponatremia and hypokalemia, AKI with Cr 1.90, mildly elevated AST 81 and ALT 50, BNP mildly elevated 227, troponin 24 ->26 ->14, ferritin normal, LDH elevated 212, d-dimer elvated 561 -->589. Rapid antigen Covid-19 positive, confirmed by positive PCR. Chest xray showed pulmonary vascular congestion, however does appear concerning for multifocal pneumonia on personal review. Patient is admitted for acute hypoxic respiratory failure due to Covid-19 pneumonia, being treated with Remdesivir, Decadron, vitamins C and D, and zinc.Oxygen requirements increased overnight 12/24, required  10 L/min HFNC, night coverage provider gave Actemra.  Morning of 12/26 spO2 87% on 11 L/min HFNC.  Severe Sepsis due to Covid-19 - present on admission.  As evidenced by fever, tachypnea, hypoxia, and AKI in setting of Covid-19 pneumonia Acute respiratory failure with hypoxiasecondary to COVID-19 multifocal pneumonia --12/25: received Actemra overnight for increased oxygen requirement up to 10L from 3L. --As of 12/26 now up to 14 L/min to get to 90% --supplemental oxygen, maintain O2 sat > 90% --maintain airborne and contact precautions --Complete remdesivir course per pharmacy- last day 12/27 --continue IV Decadron --continue vitamin C, zinc, vitamin D --continue Mucinex, Tussionex PRN cough --continue Atrovent inhaler -- status post antibiotics that was started on admission; pro-Cal found to be negative --monitor inflammatory markers CRP, ferritin, d-dimer --[facility] transfer pending as of 12/27 AM - on the admit list.  We contacted [facility] today; Contacted nurse and would be here in 20 mins.  -Updated patient by myself on the phone.  Explained benefits versus risk.  Patient said he would be in touch with his family.  Try to call his wife at the number saved in EMR but nobody answered.  Acute Kidney Injury- Improved to baseline with hydration. IV fluids stopped. Likely prerenal azotemia in setting of dehydration. -- resumed diuretic but had to hold on 12/26 due to low BP, monitor BP with this (ordered 55m daily, takes 874mevery other day at home) --monitor BMP daily --avoid nephrotoxic agents, renally dose meds as indicated  Hyponatremia -  resolved with IV fluid  Hypokalemia -  Repleated and Resolved.  DM type 2 with diabetic peripheral neuropathy Home regimen is [place] and [place] Humalog sliding scale insulin.  --basal coverage: Levemir 17 units BID, titrate up as needed --resistant sliding scale TID with meals --continue gabapentin  Essential hypertension- chronic, stable.  Had transient hypotension --continue home Norvasc, clonidine, Coreg and hydralazine (reduced dose) - May need further dose adjustment   CAD (coronary artery disease)- stable --continue ASA, Plavix, Coreg, Imdur, PRN nitroglycerin SL  Gout --continue colchicine and probenacid  Hyperlipidemia --continue Pravachol  Crohn's disease- appears stable  GERD- continue home PPI   Consults: None  Significant Diagnostic Studies: Imaging and Labs   Treatments: as above   Discharge Exam: Blood pressure 117/71, pulse 65, temperature 97.8 F (36.6 C), temperature source Oral, resp. rate 20, height 5' 8"  (1.727 m), weight 87.1 kg, SpO2 93 %.  General exam: awake, alert, Mild distress HEENT: moist mucus membranes, hearing impairment Respiratory system: clear to auscultation bilaterally, no wheezes, rales or rhonchi, normal respiratory effort. Cardiovascular system: normal S1/S2, RRR, no JVD, murmurs, rubs, gallops, no pedal edema.   Central nervous system: alert and oriented x3. no gross focal neurologic deficits, normal speech Extremities: moves all, no cyanosis, normal tone Skin: dry, intact, normal temperature Psychiatry: normal mood, congruent affect  Disposition: Discharge disposition: 02-Transferred to [facility]     -Updated patient by myself on the phone.  Explained benefits versus risk.  Patient said he would be in touch with his family.  Try to call his wife at the number saved in EMR but nobody answered. -EMTALA filled out and notified nurse  Discharge Instructions    Bed rest   Complete by: As directed    Diet - low sodium heart healthy   Complete by: As directed      Allergies as of 09/09/2019      Reactions   Allopurinol Diarrhea, Other (See Comments), Nausea And Vomiting   Other Reaction: GI Upset   Atenolol Other (See Comments)   Other Reaction: bradycardia   Atorvastatin Other (See Comments)   Other Reaction: muscle aches   Ramipril Rash    Rosuvastatin Other (See Comments)   Muscle aches   Simvastatin Other (See Comments)   Other Reaction: MUSCLE ACHES (Zocor)   Valsartan Other (See Comments)   Other Reaction: facial swelling   Cardizem [diltiazem] Rash      Medication List    TAKE these medications   amitriptyline 50 MG tablet Commonly known as: ELAVIL Take 0.5 tablets (25 mg total) by mouth at bedtime as needed.   amLODipine 5 MG tablet Commonly known as: NORVASC Take 1 tablet (5 mg total) by mouth 2 (two) times daily. If blood pressure >140/>90 make take another 1 pill of 5 mg   aspirin 81 MG EC tablet Take 1 tablet (81 mg total) by mouth daily.   Bentyl 10 MG capsule Generic drug: dicyclomine Take 10 mg by mouth 3 (three) times daily before meals.   carvedilol 25 MG tablet Commonly known as: COREG Take 1 tablet (25 mg total) by mouth 2 (two) times daily with a meal.   Cholecalciferol 1.25 MG (50000 UT) Tabs Take 1 tablet by mouth once a week.   cloNIDine 0.2 MG tablet Commonly known as: CATAPRES Take 1 tablet (0.2 mg total) by mouth 2 (two) times daily.   clopidogrel 75 MG tablet Commonly known as: PLAVIX Take 75 mg by mouth daily.   colchicine 0.6 MG tablet TAKE 1 TABLET BY MOUTH ONCE DAILY   diclofenac sodium 1 % Gel Commonly known as: VOLTAREN Apply 2 g topically 4 (four) times daily as needed (for pain).   fluticasone 50 MCG/ACT nasal spray Commonly known as: FLONASE Place 2 sprays into both nostrils  daily.   furosemide 40 MG tablet Commonly known as: LASIX Take 80 mg by mouth every other day.   gabapentin 300 MG capsule Commonly known as: NEURONTIN take 2 capsules (681m) by mouth three times a day   hydrALAZINE 100 MG tablet Commonly known as: APRESOLINE TAKE 100 MG BY MOUTH THREE TIMES A DAY   insulin lispro 100 UNIT/ML KiwkPen Commonly known as: HUMALOG Inject 14-32 Units into the skin 3 (three) times daily. (according to sliding scale - max 96u over 24 hours)    ipratropium 0.06 % nasal spray Commonly known as: Atrovent Place 2 sprays into both nostrils 4 (four) times daily.   isosorbide mononitrate 120 MG 24 hr tablet Commonly known as: IMDUR TAKE 1 TABLET BY MOUTH EVERY DAY   levocetirizine 5 MG tablet Commonly known as: Xyzal Take 1 tablet (5 mg total) by mouth every evening.   montelukast 10 MG tablet Commonly known as: SINGULAIR Take 1 tablet (10 mg total) by mouth at bedtime.   Multi-Vitamins Tabs Take 1 tablet by mouth daily.   nitroGLYCERIN 0.4 MG SL tablet Commonly known as: NITROSTAT Place 1 tablet (0.4 mg total) under the tongue every 5 (five) minutes x 3 doses as needed. For chest pain.  Call 911 if no relief after 3 tablets   Olopatadine HCl 0.6 % Soln Place 2 sprays into the nose 2 (two) times daily.   omega-3 acid ethyl esters 1 g capsule Commonly known as: LOVAZA Take 2 capsules (2 g total) by mouth 2 (two) times daily.   omeprazole 40 MG capsule Commonly known as: PRILOSEC Take 40 mg by mouth 2 (two) times daily.   pravastatin 40 MG tablet Commonly known as: PRAVACHOL Take 1 tablet (40 mg total) by mouth daily.   probenecid 500 MG tablet Commonly known as: BENEMID Take 1,000 mg by mouth 2 (two) times daily.   sodium chloride HYPERTONIC 3 % nebulizer solution Take by nebulization 2 (two) times daily.   spironolactone 25 MG tablet Commonly known as: ALDACTONE Take 12.5 mg by mouth daily.   TTyler AasFlexTouch 200 UNIT/ML Sopn Generic drug: Insulin Degludec Inject 90 Units into the skin at bedtime.   vitamin C 500 MG tablet Commonly known as: ASCORBIC ACID Take 500 mg by mouth daily.   Vitamin E 400 units Tabs Take 400 Units by mouth 2 (two) times daily.      Follow-up Information    McLean-Scocuzza, TNino Glow MD. Schedule an appointment as soon as possible for a visit in 5 day(s).   Specialty: Internal Medicine Why: Please follow up with PCP in 3-5 days Contact information: [address] [phone]          Signed: TThornell Mule12/27/2020, 4:36 PM

## 2019-09-10 ENCOUNTER — Telehealth: Payer: Self-pay | Admitting: Pulmonary Disease

## 2019-09-10 ENCOUNTER — Other Ambulatory Visit: Payer: Self-pay

## 2019-09-10 ENCOUNTER — Encounter (HOSPITAL_COMMUNITY): Payer: Self-pay | Admitting: Internal Medicine

## 2019-09-10 LAB — CBC WITH DIFFERENTIAL/PLATELET
Abs Immature Granulocytes: 0.1 10*3/uL — ABNORMAL HIGH (ref 0.00–0.07)
Basophils Absolute: 0 10*3/uL (ref 0.0–0.1)
Basophils Relative: 0 %
Eosinophils Absolute: 0 10*3/uL (ref 0.0–0.5)
Eosinophils Relative: 0 %
HCT: 39.6 % (ref 39.0–52.0)
Hemoglobin: 13.4 g/dL (ref 13.0–17.0)
Immature Granulocytes: 1 %
Lymphocytes Relative: 6 %
Lymphs Abs: 0.6 10*3/uL — ABNORMAL LOW (ref 0.7–4.0)
MCH: 33.7 pg (ref 26.0–34.0)
MCHC: 33.8 g/dL (ref 30.0–36.0)
MCV: 99.5 fL (ref 80.0–100.0)
Monocytes Absolute: 0.4 10*3/uL (ref 0.1–1.0)
Monocytes Relative: 4 %
Neutro Abs: 9 10*3/uL — ABNORMAL HIGH (ref 1.7–7.7)
Neutrophils Relative %: 89 %
Platelets: 502 10*3/uL — ABNORMAL HIGH (ref 150–400)
RBC: 3.98 MIL/uL — ABNORMAL LOW (ref 4.22–5.81)
RDW: 13 % (ref 11.5–15.5)
WBC: 10.2 10*3/uL (ref 4.0–10.5)
nRBC: 0 % (ref 0.0–0.2)

## 2019-09-10 LAB — COMPREHENSIVE METABOLIC PANEL
ALT: 37 U/L (ref 0–44)
AST: 42 U/L — ABNORMAL HIGH (ref 15–41)
Albumin: 2.9 g/dL — ABNORMAL LOW (ref 3.5–5.0)
Alkaline Phosphatase: 62 U/L (ref 38–126)
Anion gap: 15 (ref 5–15)
BUN: 38 mg/dL — ABNORMAL HIGH (ref 8–23)
CO2: 25 mmol/L (ref 22–32)
Calcium: 8.8 mg/dL — ABNORMAL LOW (ref 8.9–10.3)
Chloride: 91 mmol/L — ABNORMAL LOW (ref 98–111)
Creatinine, Ser: 1.45 mg/dL — ABNORMAL HIGH (ref 0.61–1.24)
GFR calc Af Amer: 54 mL/min — ABNORMAL LOW (ref >60)
GFR calc non Af Amer: 47 mL/min — ABNORMAL LOW (ref >60)
Glucose, Bld: 303 mg/dL — ABNORMAL HIGH (ref 70–99)
Potassium: 4.2 mmol/L (ref 3.5–5.1)
Sodium: 131 mmol/L — ABNORMAL LOW (ref 135–145)
Total Bilirubin: 0.5 mg/dL (ref 0.3–1.2)
Total Protein: 7.3 g/dL (ref 6.5–8.1)

## 2019-09-10 LAB — C-REACTIVE PROTEIN: CRP: 1.4 mg/dL — ABNORMAL HIGH (ref <1.0)

## 2019-09-10 LAB — RESPIRATORY PANEL BY PCR

## 2019-09-10 LAB — MAGNESIUM: Magnesium: 2 mg/dL (ref 1.7–2.4)

## 2019-09-10 LAB — BRAIN NATRIURETIC PEPTIDE: B Natriuretic Peptide: 122.8 pg/mL — ABNORMAL HIGH (ref 0.0–100.0)

## 2019-09-10 LAB — HEPATITIS B SURFACE ANTIGEN: Hepatitis B Surface Ag: NONREACTIVE

## 2019-09-10 LAB — GLUCOSE, CAPILLARY
Glucose-Capillary: 260 mg/dL — ABNORMAL HIGH (ref 70–99)
Glucose-Capillary: 169 mg/dL — ABNORMAL HIGH (ref 70–99)
Glucose-Capillary: 148 mg/dL — ABNORMAL HIGH (ref 70–99)
Glucose-Capillary: 227 mg/dL — ABNORMAL HIGH (ref 70–99)

## 2019-09-10 LAB — D-DIMER, QUANTITATIVE: D-Dimer, Quant: 0.75 ug/mL-FEU — ABNORMAL HIGH (ref 0.00–0.50)

## 2019-09-10 LAB — PHOSPHORUS: Phosphorus: 2.8 mg/dL (ref 2.5–4.6)

## 2019-09-10 LAB — FERRITIN: Ferritin: 209 ng/mL (ref 24–336)

## 2019-09-10 MED ORDER — CARVEDILOL 12.5 MG PO TABS
25.0000 mg | ORAL_TABLET | Freq: Two times a day (BID) | ORAL | Status: DC
  Administered 2019-09-10 – 2019-09-17 (×12): 25 mg via ORAL
  Filled 2019-09-10 (×16): qty 2

## 2019-09-10 MED ORDER — PROBENECID 500 MG PO TABS
1000.0000 mg | ORAL_TABLET | Freq: Two times a day (BID) | ORAL | Status: DC
  Administered 2019-09-10 – 2019-09-25 (×31): 1000 mg via ORAL
  Filled 2019-09-10 (×32): qty 2

## 2019-09-10 MED ORDER — ISOSORBIDE MONONITRATE ER 60 MG PO TB24
120.0000 mg | ORAL_TABLET | Freq: Every day | ORAL | Status: DC
  Filled 2019-09-10: qty 2

## 2019-09-10 MED ORDER — CLONIDINE HCL 0.1 MG PO TABS
0.1000 mg | ORAL_TABLET | Freq: Two times a day (BID) | ORAL | Status: DC
  Administered 2019-09-10 – 2019-09-18 (×17): 0.1 mg via ORAL
  Filled 2019-09-10 (×16): qty 1

## 2019-09-10 MED ORDER — ISOSORBIDE MONONITRATE ER 60 MG PO TB24
60.0000 mg | ORAL_TABLET | Freq: Every day | ORAL | Status: DC
  Administered 2019-09-10 – 2019-09-17 (×8): 60 mg via ORAL
  Filled 2019-09-10 (×9): qty 1

## 2019-09-10 MED ORDER — IPRATROPIUM BROMIDE 0.06 % NA SOLN
2.0000 | Freq: Four times a day (QID) | NASAL | Status: DC
  Administered 2019-09-10 – 2019-09-25 (×60): 2 via NASAL
  Filled 2019-09-10 (×2): qty 15

## 2019-09-10 MED ORDER — INSULIN ASPART 100 UNIT/ML ~~LOC~~ SOLN
0.0000 [IU] | Freq: Every day | SUBCUTANEOUS | Status: DC
  Administered 2019-09-10 – 2019-09-11 (×2): 2 [IU] via SUBCUTANEOUS
  Administered 2019-09-13: 3 [IU] via SUBCUTANEOUS
  Administered 2019-09-15 – 2019-09-16 (×2): 2 [IU] via SUBCUTANEOUS
  Administered 2019-09-18: 3 [IU] via SUBCUTANEOUS
  Administered 2019-09-19 – 2019-09-20 (×2): 2 [IU] via SUBCUTANEOUS
  Administered 2019-09-21: 3 [IU] via SUBCUTANEOUS
  Administered 2019-09-23 – 2019-09-24 (×2): 2 [IU] via SUBCUTANEOUS

## 2019-09-10 MED ORDER — GABAPENTIN 300 MG PO CAPS
600.0000 mg | ORAL_CAPSULE | Freq: Three times a day (TID) | ORAL | Status: DC
  Administered 2019-09-10 (×2): 600 mg via ORAL
  Filled 2019-09-10 (×3): qty 2

## 2019-09-10 MED ORDER — AMITRIPTYLINE HCL 25 MG PO TABS
25.0000 mg | ORAL_TABLET | Freq: Every evening | ORAL | Status: DC | PRN
  Filled 2019-09-10: qty 1

## 2019-09-10 MED ORDER — INSULIN ASPART 100 UNIT/ML ~~LOC~~ SOLN
0.0000 [IU] | Freq: Three times a day (TID) | SUBCUTANEOUS | Status: DC
  Administered 2019-09-10: 11 [IU] via SUBCUTANEOUS
  Administered 2019-09-10: 4 [IU] via SUBCUTANEOUS
  Administered 2019-09-11 (×2): 11 [IU] via SUBCUTANEOUS
  Administered 2019-09-11: 7 [IU] via SUBCUTANEOUS
  Administered 2019-09-12: 13:00:00 3 [IU] via SUBCUTANEOUS
  Administered 2019-09-12: 18:00:00 7 [IU] via SUBCUTANEOUS
  Administered 2019-09-12 – 2019-09-13 (×2): 4 [IU] via SUBCUTANEOUS
  Administered 2019-09-13: 7 [IU] via SUBCUTANEOUS
  Administered 2019-09-13: 4 [IU] via SUBCUTANEOUS
  Administered 2019-09-14 (×2): 7 [IU] via SUBCUTANEOUS
  Administered 2019-09-14: 4 [IU] via SUBCUTANEOUS
  Administered 2019-09-15: 7 [IU] via SUBCUTANEOUS
  Administered 2019-09-15: 4 [IU] via SUBCUTANEOUS
  Administered 2019-09-15: 3 [IU] via SUBCUTANEOUS
  Administered 2019-09-16: 4 [IU] via SUBCUTANEOUS
  Administered 2019-09-16: 3 [IU] via SUBCUTANEOUS
  Administered 2019-09-16 – 2019-09-17 (×3): 4 [IU] via SUBCUTANEOUS
  Administered 2019-09-18: 7 [IU] via SUBCUTANEOUS
  Administered 2019-09-18: 4 [IU] via SUBCUTANEOUS
  Administered 2019-09-19 (×2): 7 [IU] via SUBCUTANEOUS
  Administered 2019-09-19: 3 [IU] via SUBCUTANEOUS
  Administered 2019-09-20: 4 [IU] via SUBCUTANEOUS
  Administered 2019-09-20: 7 [IU] via SUBCUTANEOUS
  Administered 2019-09-21: 15 [IU] via SUBCUTANEOUS
  Administered 2019-09-21 – 2019-09-23 (×4): 7 [IU] via SUBCUTANEOUS
  Administered 2019-09-23: 11 [IU] via SUBCUTANEOUS
  Administered 2019-09-23: 3 [IU] via SUBCUTANEOUS
  Administered 2019-09-24: 7 [IU] via SUBCUTANEOUS
  Administered 2019-09-24: 4 [IU] via SUBCUTANEOUS
  Administered 2019-09-25: 3 [IU] via SUBCUTANEOUS
  Administered 2019-09-25: 4 [IU] via SUBCUTANEOUS
  Administered 2019-09-25: 15 [IU] via SUBCUTANEOUS

## 2019-09-10 MED ORDER — DICYCLOMINE HCL 10 MG PO CAPS
10.0000 mg | ORAL_CAPSULE | Freq: Three times a day (TID) | ORAL | Status: DC
  Administered 2019-09-10 – 2019-09-25 (×45): 10 mg via ORAL
  Filled 2019-09-10 (×50): qty 1

## 2019-09-10 MED ORDER — MONTELUKAST SODIUM 10 MG PO TABS
10.0000 mg | ORAL_TABLET | Freq: Every day | ORAL | Status: DC
  Administered 2019-09-10 – 2019-09-24 (×15): 10 mg via ORAL
  Filled 2019-09-10 (×15): qty 1

## 2019-09-10 MED ORDER — PANTOPRAZOLE SODIUM 40 MG PO TBEC
40.0000 mg | DELAYED_RELEASE_TABLET | Freq: Every day | ORAL | Status: DC
  Administered 2019-09-10 – 2019-09-25 (×16): 40 mg via ORAL
  Filled 2019-09-10 (×16): qty 1

## 2019-09-10 MED ORDER — GABAPENTIN 300 MG PO CAPS
300.0000 mg | ORAL_CAPSULE | Freq: Three times a day (TID) | ORAL | Status: DC
  Administered 2019-09-10 – 2019-09-12 (×5): 300 mg via ORAL
  Filled 2019-09-10 (×5): qty 1

## 2019-09-10 MED ORDER — COLCHICINE 0.6 MG PO TABS
0.6000 mg | ORAL_TABLET | Freq: Every day | ORAL | Status: DC
  Administered 2019-09-10 – 2019-09-25 (×16): 0.6 mg via ORAL
  Filled 2019-09-10 (×16): qty 1

## 2019-09-10 MED ORDER — HYDRALAZINE HCL 25 MG PO TABS
25.0000 mg | ORAL_TABLET | Freq: Three times a day (TID) | ORAL | Status: DC
  Filled 2019-09-10: qty 1

## 2019-09-10 MED ORDER — INSULIN DETEMIR 100 UNIT/ML ~~LOC~~ SOLN
30.0000 [IU] | Freq: Two times a day (BID) | SUBCUTANEOUS | Status: DC
  Administered 2019-09-10 – 2019-09-25 (×31): 30 [IU] via SUBCUTANEOUS
  Filled 2019-09-10 (×32): qty 0.3

## 2019-09-10 MED ORDER — CETIRIZINE HCL 10 MG PO TABS
10.0000 mg | ORAL_TABLET | Freq: Every day | ORAL | Status: DC
  Administered 2019-09-10 – 2019-09-25 (×16): 10 mg via ORAL
  Filled 2019-09-10 (×16): qty 1

## 2019-09-10 MED ORDER — CLOPIDOGREL BISULFATE 75 MG PO TABS
75.0000 mg | ORAL_TABLET | Freq: Every day | ORAL | Status: DC
  Administered 2019-09-10 – 2019-09-25 (×16): 75 mg via ORAL
  Filled 2019-09-10 (×17): qty 1

## 2019-09-10 MED ORDER — SPIRONOLACTONE 12.5 MG HALF TABLET
12.5000 mg | ORAL_TABLET | Freq: Every day | ORAL | Status: DC
  Administered 2019-09-10 – 2019-09-15 (×6): 12.5 mg via ORAL
  Filled 2019-09-10 (×6): qty 1

## 2019-09-10 MED ORDER — PRAVASTATIN SODIUM 10 MG PO TABS
40.0000 mg | ORAL_TABLET | Freq: Every day | ORAL | Status: DC
  Administered 2019-09-10 – 2019-09-25 (×16): 40 mg via ORAL
  Filled 2019-09-10: qty 4
  Filled 2019-09-10 (×3): qty 1
  Filled 2019-09-10: qty 4
  Filled 2019-09-10 (×2): qty 1
  Filled 2019-09-10 (×2): qty 4
  Filled 2019-09-10 (×7): qty 1
  Filled 2019-09-10: qty 4

## 2019-09-10 MED ORDER — CLONIDINE HCL 0.1 MG PO TABS
0.2000 mg | ORAL_TABLET | Freq: Two times a day (BID) | ORAL | Status: DC
  Filled 2019-09-10: qty 2

## 2019-09-10 MED ORDER — HALOPERIDOL LACTATE 5 MG/ML IJ SOLN: 1.0000 mg | Freq: Four times a day (QID) | INTRAMUSCULAR | Status: DC | PRN

## 2019-09-10 MED ORDER — AMLODIPINE BESYLATE 5 MG PO TABS
5.0000 mg | ORAL_TABLET | Freq: Two times a day (BID) | ORAL | Status: DC
  Filled 2019-09-10: qty 1

## 2019-09-10 NOTE — Telephone Encounter (Signed)
Spoke to pt's spouse, Gaffer (DPR).  Eula wanted to make Dr. Patsey Berthold aware that pt is currently admitted at [facility] for covid.   Will route to Dr. Patsey Berthold to make her aware.

## 2019-09-10 NOTE — Progress Notes (Signed)
38: MD alerted of pt non-compliance with HFNC and NRB. Pt just on HFNC but frequently takes it off. CCMD alerted me that pt was sats in 44s with a good waveform. This nurse went into the room and saw pt had taken off HFNC. I educated pt on importance that he keeps HFNC on yet he still chooses to take it off.

## 2019-09-10 NOTE — Progress Notes (Signed)
Patient refusing to wear nonrebreather.  Patient placed on 15L high flow nasal canula with O2 saturation hovering between 87 and 91 percent.  Educated on the importance of proning and keeping his oxygen on his face.  Patient is refusing both.

## 2019-09-10 NOTE — Progress Notes (Signed)
Chase Cabrera  ZOX:[id number] DOB: June 23, 1946 DOA: 09/09/2019 PCP: McLean-Scocuzza, Nino Glow, MD    Brief Narrative:  73 year old with a history of CHF, CAD, Crohn's disease, DM 2, GERD, HTN, gout, and HLD who presented to [facility] 12/22 with acute onset shortness of breath and generalized weakness which have been present for 4 days.  In the ED he was found to have a temperature of 102 and saturations of 90% on room air.  He was found to be Covid positive and a CXR noted pulmonary vascular congestion.  He was admitted and treatment was initiated with remdesivir and Decadron.  12/27 he was transferred to [facility].  Significant Events: 12/22 admit to [facility] 12/27 transfer to [facility]  COVID-19 specific Treatment: Decadron 12/22 > 12/28 Remdesivir 12/22 > 12/26 Actemra 12/24  Antimicrobials:  Azithromycin 12/22 Rocephin 12/22  Subjective: Since his arrival to [facility] the patient has been refusing to wear his oxygen support and at times desaturates profoundly.  At the time of my visit he is confused and mildly agitated and unable to provide a ROS.  He does not appear to be in acute distress.  Assessment & Plan:  COVID Pneumonia -acute hypoxic respiratory failure Completed courses of remdesivir and Decadron - was dosed with Actemra -respirations appear stable when on oxygen but patient is frequently removing his oxygen  Recent Labs  Lab 09/05/19 0737 09/07/19 0513 09/08/19 0404 09/09/19 0706 09/09/19 2100 09/10/19 0423  DDIMER  --   --   --   --  0.83* 0.75*  FERRITIN 315 342* 251 248 207 209  CRP 13.7* 6.7* 4.9* 1.9* 2.5* 1.4*  ALT 44 33 29 36 33 51  PROCALCITON <0.10  --   --   --  <0.10  --     Hyponatremia Follow trend  Acute kidney injury Creatinine 1.04 October this year -hydrate and follow  Chronic atrial fibrillation Sinus bradycardia at present   DM 2 uncontrolled with peripheral neuropathy and hyperglycemia CBG above goal with use of steroid -adjust  treatment and follow  HLD Continue usual home medication  BPH Continue usual home medication  DVT prophylaxis: Lovenox Code Status: FULL CODE Family Communication:  Disposition Plan: Progressive care bed  Consultants:  none  Objective: Blood pressure (!) 118/48, pulse (!) 56, temperature 97.8 F (36.6 C), temperature source Oral, resp. rate 19, height 5' 8"  (1.727 m), weight 82.1 kg, SpO2 (!) 86 %.  Intake/Output Summary (Last 24 hours) at 09/10/2019 0930 Last data filed at 09/10/2019 0800 Gross per 24 hour  Intake --  Output 975 ml  Net -975 ml   Filed Weights   09/09/19 1821  Weight: 82.1 kg    Examination: General: No acute respiratory distress Lungs: Fine diffuse crackles without wheezing Cardiovascular: Mild bradycardia without murmur or rub Abdomen: Nontender, nondistended, soft, bowel sounds positive, no rebound, no ascites, no appreciable mass Extremities: No significant cyanosis, clubbing, or edema bilateral lower extremities  CBC: Recent Labs  Lab 09/09/19 0706 09/09/19 2100 09/10/19 0423  WBC 9.4 9.6 10.2  NEUTROABS 8.1* 6.7 9.0*  HGB 13.6 13.7 13.4  HCT 38.9* 39.7 39.6  MCV 97.7 99.3 99.5  PLT 453* 468* 811*   Basic Metabolic Panel: Recent Labs  Lab 09/04/19 2028 09/09/19 0706 09/09/19 2100 09/10/19 0423  NA  --  132* 131* 131*  K  --  4.2 3.8 4.2  CL  --  94* 91* 91*  CO2  --  25 27 25   GLUCOSE  --  231* 190* 303*  BUN  --  37* 39* 38*  CREATININE  --  1.27* 1.61* 1.45*  CALCIUM  --  9.1 8.8* 8.8*  MG 1.9  --  2.0 2.0  PHOS  --   --  2.9 2.8   GFR: Estimated Creatinine Clearance: 46 mL/min (A) (by C-G formula based on SCr of 1.45 mg/dL (H)).  Liver Function Tests: Recent Labs  Lab 09/08/19 0404 09/09/19 0706 09/09/19 2100 09/10/19 0423  AST 42* 44* 44* 42*  ALT 29 36 33 37  ALKPHOS 48 58 55 62  BILITOT 0.7 0.8 0.9 0.5  PROT 7.2 7.8 7.1 7.3  ALBUMIN 2.8* 3.1* 3.1* 2.9*    HbA1C: Hgb A1c MFr Bld  Date/Time Value Ref  Range Status  04/30/2018 04:18 PM 7.1 (H) 4.8 - 5.6 % Final    Comment:    (NOTE) Pre diabetes:          5.7%-6.4% Diabetes:              >6.4% Glycemic control for   <7.0% adults with diabetes   01/12/2018 04:23 PM 5.8 (H) 4.8 - 5.6 % Final    Comment:    (NOTE) Pre diabetes:          5.7%-6.4% Diabetes:              >6.4% Glycemic control for   <7.0% adults with diabetes     CBG: Recent Labs  Lab 09/09/19 0736 09/09/19 1227 09/09/19 1634 09/09/19 2029 09/10/19 0721  GLUCAP 227* 177* 223* 168* 260*    Recent Results (from the past 240 hour(s))  CULTURE, BLOOD (ROUTINE X 2) w Reflex to ID Panel     Status: None   Collection Time: 09/04/19  6:38 PM   Specimen: BLOOD  Result Value Ref Range Status   Specimen Description BLOOD RIGHT ANTECUBITAL  Final   Special Requests   Final    BOTTLES DRAWN AEROBIC AND ANAEROBIC Blood Culture adequate volume   Culture   Final    NO GROWTH 5 DAYS Performed at [facility], [address] [place], [place] [address]    Report Status 09/09/2019 FINAL  Final  CULTURE, BLOOD (ROUTINE X 2) w Reflex to ID Panel     Status: None   Collection Time: 09/04/19  6:40 PM   Specimen: BLOOD  Result Value Ref Range Status   Specimen Description BLOOD LEFT ANTECUBITAL  Final   Special Requests   Final    BOTTLES DRAWN AEROBIC AND ANAEROBIC Blood Culture adequate volume   Culture   Final    NO GROWTH 5 DAYS Performed at [facility], [address] [place], [place] [address]    Report Status 09/09/2019 FINAL  Final  Respiratory Panel by PCR     Status: None   Collection Time: 09/09/19  6:50 PM   Specimen: Nasopharyngeal Swab; Respiratory  Result Value Ref Range Status   Adenovirus NOT DETECTED NOT DETECTED Final   Coronavirus 229E NOT DETECTED NOT DETECTED Final    Comment: (NOTE) The Coronavirus on the Respiratory Panel, DOES NOT test for the novel  Coronavirus (2019 nCoV)    Coronavirus HKU1 NOT DETECTED NOT  DETECTED Final   Coronavirus NL63 NOT DETECTED NOT DETECTED Final   Coronavirus OC43 NOT DETECTED NOT DETECTED Final   Metapneumovirus NOT DETECTED NOT DETECTED Final   Rhinovirus / Enterovirus NOT DETECTED NOT DETECTED Final   Influenza A NOT DETECTED NOT DETECTED Final   Influenza  B NOT DETECTED NOT DETECTED Final   Parainfluenza Virus 1 NOT DETECTED NOT DETECTED Final   Parainfluenza Virus 2 NOT DETECTED NOT DETECTED Final   Parainfluenza Virus 3 NOT DETECTED NOT DETECTED Final   Parainfluenza Virus 4 NOT DETECTED NOT DETECTED Final   Respiratory Syncytial Virus NOT DETECTED NOT DETECTED Final   Bordetella pertussis NOT DETECTED NOT DETECTED Final   Chlamydophila pneumoniae NOT DETECTED NOT DETECTED Final   Mycoplasma pneumoniae NOT DETECTED NOT DETECTED Final    Comment: Performed at [facility], [place] [address], [place], [place] [address]     Scheduled Meds: . vitamin C  500 mg Oral Daily  . aspirin EC  81 mg Oral Daily  . carvedilol  25 mg Oral BID WC  . cetirizine  10 mg Oral Daily  . cloNIDine  0.1 mg Oral BID  . clopidogrel  75 mg Oral Daily  . colchicine  0.6 mg Oral Daily  . dexamethasone  6 mg Oral Q24H  . dicyclomine  10 mg Oral TID AC  . docusate sodium  100 mg Oral BID  . enoxaparin (LOVENOX) injection  40 mg Subcutaneous Q24H  . folic acid  1 mg Oral Daily  . gabapentin  600 mg Oral TID  . insulin aspart  0-20 Units Subcutaneous TID WC  . insulin aspart  0-5 Units Subcutaneous QHS  . insulin detemir  30 Units Subcutaneous BID  . ipratropium  2 spray Each Nare QID  . isosorbide mononitrate  60 mg Oral Daily  . montelukast  10 mg Oral QHS  . pantoprazole  40 mg Oral Daily  . pravastatin  40 mg Oral Daily  . probenecid  1,000 mg Oral BID  . sodium chloride flush  3 mL Intravenous Q12H  . spironolactone  12.5 mg Oral Daily  . thiamine  100 mg Oral Daily  . zinc sulfate  220 mg Oral Daily     LOS: 1 day   Cherene Altes, MD [facility] Office  [phone] Pager - Text Page per Amion  If 7PM-7AM, please contact night-coverage per Amion 09/10/2019, 9:30 AM

## 2019-09-10 NOTE — Progress Notes (Signed)
Pt son updated and all questions answered. Son says pt has early onset dementia which may be why he's noncompliant with oxygen. MD will be notified.

## 2019-09-10 NOTE — TOC Progression Note (Signed)
Transition of Care [facility]) - Progression Note    Patient Details  Name: Chase Cabrera MRN: [id number] Date of Birth: 1945/11/05  Transition of Care [facility]) CM/SW Contact  Shade Flood, LCSW Phone Number: 09/10/2019, 10:23 AM  Clinical Narrative:     Pt transferred to [facility] from [facility]. Initial TOC assessment completed at [facility]. Briefly, pt from home with wife and his grandson and his wife. Pt independent in ADLs prior to admission. Initial PT eval recommended HH PT at dc and a RW.  Anticipating dc home with [place] and DME when stable. TOC at [facility] started referral to [place] prior to pt's transfer to [facility].  Will follow and continue to assess and assist with dc planning as pt's stay progresses.  Expected Discharge Plan: [place] Barriers to Discharge: Continued Medical Work up  Expected Discharge Plan and Services Expected Discharge Plan: [place]                                               Social Determinants of Health (SDOH) Interventions    Readmission Risk Interventions Readmission Risk Prevention Plan 09/10/2019 09/05/2019 09/05/2019  Transportation Screening - Complete Complete  Medication Review Press photographer) - Complete -  PCP or Specialist appointment within 3-5 days of discharge - Complete -  [place] or Home Care Consult Complete - -  SW Recovery Care/Counseling Consult - Complete -  [place] Not Applicable - -  Some recent data might be hidden

## 2019-09-11 LAB — COMPREHENSIVE METABOLIC PANEL
ALT: 41 U/L (ref 0–44)
AST: 48 U/L — ABNORMAL HIGH (ref 15–41)
Albumin: 3.1 g/dL — ABNORMAL LOW (ref 3.5–5.0)
Alkaline Phosphatase: 65 U/L (ref 38–126)
Anion gap: 12 (ref 5–15)
BUN: 36 mg/dL — ABNORMAL HIGH (ref 8–23)
CO2: 26 mmol/L (ref 22–32)
Calcium: 8.9 mg/dL (ref 8.9–10.3)
Chloride: 95 mmol/L — ABNORMAL LOW (ref 98–111)
Creatinine, Ser: 1.38 mg/dL — ABNORMAL HIGH (ref 0.61–1.24)
GFR calc Af Amer: 58 mL/min — ABNORMAL LOW (ref >60)
GFR calc non Af Amer: 50 mL/min — ABNORMAL LOW (ref >60)
Glucose, Bld: 267 mg/dL — ABNORMAL HIGH (ref 70–99)
Potassium: 4.2 mmol/L (ref 3.5–5.1)
Sodium: 133 mmol/L — ABNORMAL LOW (ref 135–145)
Total Bilirubin: 1 mg/dL (ref 0.3–1.2)
Total Protein: 7.2 g/dL (ref 6.5–8.1)

## 2019-09-11 LAB — CBC WITH DIFFERENTIAL/PLATELET
Abs Immature Granulocytes: 0.12 10*3/uL — ABNORMAL HIGH (ref 0.00–0.07)
Basophils Absolute: 0 10*3/uL (ref 0.0–0.1)
Basophils Relative: 0 %
Eosinophils Absolute: 0.1 10*3/uL (ref 0.0–0.5)
Eosinophils Relative: 1 %
HCT: 41.2 % (ref 39.0–52.0)
Hemoglobin: 14 g/dL (ref 13.0–17.0)
Immature Granulocytes: 1 %
Lymphocytes Relative: 6 %
Lymphs Abs: 0.6 10*3/uL — ABNORMAL LOW (ref 0.7–4.0)
MCH: 34.2 pg — ABNORMAL HIGH (ref 26.0–34.0)
MCHC: 34 g/dL (ref 30.0–36.0)
MCV: 100.7 fL — ABNORMAL HIGH (ref 80.0–100.0)
Monocytes Absolute: 0.3 10*3/uL (ref 0.1–1.0)
Monocytes Relative: 3 %
Neutro Abs: 8.4 10*3/uL — ABNORMAL HIGH (ref 1.7–7.7)
Neutrophils Relative %: 89 %
Platelets: 483 10*3/uL — ABNORMAL HIGH (ref 150–400)
RBC: 4.09 MIL/uL — ABNORMAL LOW (ref 4.22–5.81)
RDW: 13.2 % (ref 11.5–15.5)
WBC: 9.6 10*3/uL (ref 4.0–10.5)
nRBC: 0 % (ref 0.0–0.2)

## 2019-09-11 LAB — GLUCOSE, CAPILLARY
Glucose-Capillary: 238 mg/dL — ABNORMAL HIGH (ref 70–99)
Glucose-Capillary: 266 mg/dL — ABNORMAL HIGH (ref 70–99)
Glucose-Capillary: 198 mg/dL — ABNORMAL HIGH (ref 70–99)
Glucose-Capillary: 283 mg/dL — ABNORMAL HIGH (ref 70–99)
Glucose-Capillary: 252 mg/dL — ABNORMAL HIGH (ref 70–99)

## 2019-09-11 LAB — FERRITIN: Ferritin: 178 ng/mL (ref 24–336)

## 2019-09-11 LAB — PHOSPHORUS: Phosphorus: 2.7 mg/dL (ref 2.5–4.6)

## 2019-09-11 LAB — D-DIMER, QUANTITATIVE: D-Dimer, Quant: 0.73 ug/mL-FEU — ABNORMAL HIGH (ref 0.00–0.50)

## 2019-09-11 LAB — C-REACTIVE PROTEIN: CRP: 1.7 mg/dL — ABNORMAL HIGH (ref <1.0)

## 2019-09-11 LAB — HEMOGLOBIN A1C
Hgb A1c MFr Bld: 7.1 % — ABNORMAL HIGH (ref 4.8–5.6)
Mean Plasma Glucose: 157.07 mg/dL

## 2019-09-11 LAB — MAGNESIUM: Magnesium: 2.3 mg/dL (ref 1.7–2.4)

## 2019-09-11 MED ORDER — FUROSEMIDE 10 MG/ML IJ SOLN
60.0000 mg | Freq: Two times a day (BID) | INTRAMUSCULAR | Status: DC
  Administered 2019-09-11 – 2019-09-12 (×2): 60 mg via INTRAVENOUS
  Filled 2019-09-11 (×2): qty 6

## 2019-09-11 NOTE — Progress Notes (Signed)
Chase Cabrera  [id number]:[id number] DOB: Sep 08, 1946 DOA: 09/09/2019 PCP: McLean-Scocuzza, Nino Glow, MD    Brief Narrative:  73 year old with a history of CHF, CAD, Crohn's disease, DM 2, GERD, HTN, gout, and HLD who presented to [facility] 12/22 with acute onset shortness of breath and generalized weakness which have been present for 4 days.  In the ED he was found to have a temperature of 102 and saturations of 90% on room air.  He was found to be Covid positive and a CXR noted pulmonary vascular congestion.  He was admitted and treatment was initiated with remdesivir and Decadron.  12/27 he was transferred to [facility].  Significant Events: 12/22 admit to [facility] 12/27 transfer to [facility]  COVID-19 specific Treatment: Decadron 12/22 > 12/28 Remdesivir 12/22 > 12/26 Actemra 12/24  Antimicrobials:  Azithromycin 12/22 Rocephin 12/22  Subjective: Much more calm today. Tells me he lives in [place]. Denies complaints.  In no apparent respiratory distress.  Is confused but calm.  Assessment & Plan:  COVID Pneumonia - acute hypoxic respiratory failure Completed courses of remdesivir and decadron - was dosed with Actemra -in no apparent acute distress but continues to require significant oxygen support  Recent Labs  Lab 09/05/19 0737 09/08/19 0404 09/09/19 0706 09/09/19 2100 09/10/19 0423 09/11/19 0220  DDIMER  --   --   --  0.83* 0.75* 0.73*  FERRITIN 315 251 248 207 209 178  CRP 13.7* 4.9* 1.9* 2.5* 1.4* 1.7*  ALT 44 29 36 33 37 41  PROCALCITON <0.10  --   --  <0.10  --   --     Hyponatremia Sodium trending upward  Acute kidney injury Creatinine 1.04 October this year - creatinine slowly improving  Chronic atrial fibrillation Sinus bradycardia at present -does not appear to be on chronic anticoagulation  Chronic diastolic CHF Typically treated with Lasix 80 mg QOD - baseline weight approximately 190  DM 2 uncontrolled with peripheral neuropathy and hyperglycemia CBG above  goal with use of steroid -adjust treatment and follow  HLD Continue usual home medication  BPH Continue usual home medication  DVT prophylaxis: Lovenox Code Status: FULL CODE Family Communication:  Disposition Plan: Progressive care bed  Consultants:  none  Objective: Blood pressure 139/66, pulse (!) 52, temperature 98.5 F (36.9 C), temperature source Oral, resp. rate 19, height 5' 8"  (1.727 m), weight 82.1 kg, SpO2 (!) 85 %.  Intake/Output Summary (Last 24 hours) at 09/11/2019 1025 Last data filed at 09/11/2019 0741 Gross per 24 hour  Intake -  Output 1175 ml  Net -1175 ml   Filed Weights   09/09/19 1821  Weight: 82.1 kg    Examination: General: No acute respiratory distress Lungs: Fine diffuse crackles without wheezing Cardiovascular: No murmur or rub -regular Abdomen: NT/ND, soft, BS positive Extremities: No significant cyanosis, clubbing, or edema bilateral lower extremities  CBC: Recent Labs  Lab 09/09/19 2100 09/10/19 0423 09/11/19 0220  WBC 9.6 10.2 9.6  NEUTROABS 6.7 9.0* 8.4*  HGB 13.7 13.4 14.0  HCT 39.7 39.6 41.2  MCV 99.3 99.5 100.7*  PLT 468* 502* 759*   Basic Metabolic Panel: Recent Labs  Lab 09/09/19 2100 09/10/19 0423 09/11/19 0220  NA 131* 131* 133*  K 3.8 4.2 4.2  CL 91* 91* 95*  CO2 27 25 26   GLUCOSE 190* 303* 267*  BUN 39* 38* 36*  CREATININE 1.61* 1.45* 1.38*  CALCIUM 8.8* 8.8* 8.9  MG 2.0 2.0 2.3  PHOS 2.9 2.8 2.7   GFR:  Estimated Creatinine Clearance: 48.3 mL/min (A) (by C-G formula based on SCr of 1.38 mg/dL (H)).  Liver Function Tests: Recent Labs  Lab 09/09/19 0706 09/09/19 2100 09/10/19 0423 09/11/19 0220  AST 44* 44* 42* 48*  ALT 36 33 37 41  ALKPHOS 58 55 62 65  BILITOT 0.8 0.9 0.5 1.0  PROT 7.8 7.1 7.3 7.2  ALBUMIN 3.1* 3.1* 2.9* 3.1*    HbA1C: Hgb A1c MFr Bld  Date/Time Value Ref Range Status  09/11/2019 02:20 AM 7.1 (H) 4.8 - 5.6 % Final    Comment:    (NOTE) Pre diabetes:          5.7%-6.4%  Diabetes:              >6.4% Glycemic control for   <7.0% adults with diabetes   04/30/2018 04:18 PM 7.1 (H) 4.8 - 5.6 % Final    Comment:    (NOTE) Pre diabetes:          5.7%-6.4% Diabetes:              >6.4% Glycemic control for   <7.0% adults with diabetes     CBG: Recent Labs  Lab 09/10/19 1120 09/10/19 1517 09/10/19 1810 09/11/19 0347 09/11/19 0722  GLUCAP 169* 148* 227* 266* 198*    Recent Results (from the past 240 hour(s))  CULTURE, BLOOD (ROUTINE X 2) w Reflex to ID Panel     Status: None   Collection Time: 09/04/19  6:38 PM   Specimen: BLOOD  Result Value Ref Range Status   Specimen Description BLOOD RIGHT ANTECUBITAL  Final   Special Requests   Final    BOTTLES DRAWN AEROBIC AND ANAEROBIC Blood Culture adequate volume   Culture   Final    NO GROWTH 5 DAYS Performed at [facility], [address] [place], [place] [address]    Report Status 09/09/2019 FINAL  Final  CULTURE, BLOOD (ROUTINE X 2) w Reflex to ID Panel     Status: None   Collection Time: 09/04/19  6:40 PM   Specimen: BLOOD  Result Value Ref Range Status   Specimen Description BLOOD LEFT ANTECUBITAL  Final   Special Requests   Final    BOTTLES DRAWN AEROBIC AND ANAEROBIC Blood Culture adequate volume   Culture   Final    NO GROWTH 5 DAYS Performed at [facility], [address] [place], [place] [address]    Report Status 09/09/2019 FINAL  Final  Respiratory Panel by PCR     Status: None   Collection Time: 09/09/19  6:50 PM   Specimen: Nasopharyngeal Swab; Respiratory  Result Value Ref Range Status   Adenovirus NOT DETECTED NOT DETECTED Final   Coronavirus 229E NOT DETECTED NOT DETECTED Final    Comment: (NOTE) The Coronavirus on the Respiratory Panel, DOES NOT test for the novel  Coronavirus (2019 nCoV)    Coronavirus HKU1 NOT DETECTED NOT DETECTED Final   Coronavirus NL63 NOT DETECTED NOT DETECTED Final   Coronavirus OC43 NOT DETECTED NOT DETECTED Final    Metapneumovirus NOT DETECTED NOT DETECTED Final   Rhinovirus / Enterovirus NOT DETECTED NOT DETECTED Final   Influenza A NOT DETECTED NOT DETECTED Final   Influenza B NOT DETECTED NOT DETECTED Final   Parainfluenza Virus 1 NOT DETECTED NOT DETECTED Final   Parainfluenza Virus 2 NOT DETECTED NOT DETECTED Final   Parainfluenza Virus 3 NOT DETECTED NOT DETECTED Final   Parainfluenza Virus 4 NOT DETECTED NOT DETECTED Final  Respiratory Syncytial Virus NOT DETECTED NOT DETECTED Final   Bordetella pertussis NOT DETECTED NOT DETECTED Final   Chlamydophila pneumoniae NOT DETECTED NOT DETECTED Final   Mycoplasma pneumoniae NOT DETECTED NOT DETECTED Final    Comment: Performed at [facility], [place] [address] [place], [place] [address]  Culture, blood (Routine X 2) w Reflex to ID Panel     Status: None (Preliminary result)   Collection Time: 09/09/19  9:00 PM   Specimen: BLOOD RIGHT HAND  Result Value Ref Range Status   Specimen Description   Final    BLOOD RIGHT HAND Performed at [facility] [address] [place], [place] [address]    Special Requests   Final    BOTTLES DRAWN AEROBIC ONLY Blood Culture adequate volume Performed at [facility] [address] [place], [place] [address]    Culture   Final    NO GROWTH < 12 HOURS Performed at [facility] [address], [place], [place] [address]    Report Status PENDING  Incomplete  Culture, blood (Routine X 2) w Reflex to ID Panel     Status: None (Preliminary result)   Collection Time: 09/09/19  9:00 PM   Specimen: BLOOD LEFT HAND  Result Value Ref Range Status   Specimen Description   Final    BLOOD LEFT HAND Performed at [facility] [address] [place], [place] [address]    Special Requests   Final    BOTTLES DRAWN AEROBIC ONLY Blood Culture adequate volume Performed at [facility] [address] [place], [place] [address]     Culture   Final    NO GROWTH < 12 HOURS Performed at [facility] [address], [place],  [address]    Report Status PENDING  Incomplete     Scheduled Meds: . vitamin C  500 mg Oral Daily  . aspirin EC  81 mg Oral Daily  . carvedilol  25 mg Oral BID WC  . cetirizine  10 mg Oral Daily  . cloNIDine  0.1 mg Oral BID  . clopidogrel  75 mg Oral Daily  . colchicine  0.6 mg Oral Daily  . dicyclomine  10 mg Oral TID AC  . docusate sodium  100 mg Oral BID  . enoxaparin (LOVENOX) injection  40 mg Subcutaneous Q24H  . folic acid  1 mg Oral Daily  . gabapentin  300 mg Oral TID  . insulin aspart  0-20 Units Subcutaneous TID WC  . insulin aspart  0-5 Units Subcutaneous QHS  . insulin detemir  30 Units Subcutaneous BID  . ipratropium  2 spray Each Nare QID  . isosorbide mononitrate  60 mg Oral Daily  . montelukast  10 mg Oral QHS  . pantoprazole  40 mg Oral Daily  . pravastatin  40 mg Oral Daily  . probenecid  1,000 mg Oral BID  . sodium chloride flush  3 mL Intravenous Q12H  . spironolactone  12.5 mg Oral Daily  . thiamine  100 mg Oral Daily  . zinc sulfate  220 mg Oral Daily     LOS: 2 days   Cherene Altes, MD [facility] Office  [phone] Pager - Text Page per Amion  If 7PM-7AM, please contact night-coverage per Amion 09/11/2019, 10:25 AM

## 2019-09-11 NOTE — Progress Notes (Signed)
Inpatient Diabetes Program Recommendations  AACE/ADA: New Consensus Statement on Inpatient Glycemic Control (2015)  Target Ranges:  Prepandial:   less than 140 mg/dL      Peak postprandial:   less than 180 mg/dL (1-2 hours)      Critically ill patients:  140 - 180 mg/dL   Lab Results  Component Value Date   GLUCAP 283 (H) 09/11/2019   HGBA1C 7.1 (H) 09/11/2019    Review of Glycemic Control  Blood sugars above goal of 140-180 mg/dL. On meal coverage insulin (Humalog 14-32 units tidwc PTA)  Inpatient Diabetes Program Recommendations:     Add Novolog 3 units tidwc for meal coverage insulin if pt eats > 50% meal.  Will continue to follow.   Thank you. Lorenda Peck, RD, LDN, CDE Inpatient Diabetes Coordinator [phone]

## 2019-09-11 NOTE — Telephone Encounter (Signed)
Noted  

## 2019-09-12 ENCOUNTER — Inpatient Hospital Stay (HOSPITAL_COMMUNITY): Payer: Medicare Other

## 2019-09-12 DIAGNOSIS — L8990 Pressure ulcer of unspecified site, unspecified stage: Secondary | ICD-10-CM | POA: Insufficient documentation

## 2019-09-12 LAB — CBC WITH DIFFERENTIAL/PLATELET
Abs Immature Granulocytes: 0.2 10*3/uL — ABNORMAL HIGH (ref 0.00–0.07)
Basophils Absolute: 0.1 10*3/uL (ref 0.0–0.1)
Basophils Relative: 1 %
Eosinophils Absolute: 0.6 10*3/uL — ABNORMAL HIGH (ref 0.0–0.5)
Eosinophils Relative: 6 %
HCT: 43.2 % (ref 39.0–52.0)
Hemoglobin: 14.3 g/dL (ref 13.0–17.0)
Immature Granulocytes: 2 %
Lymphocytes Relative: 17 %
Lymphs Abs: 1.6 10*3/uL (ref 0.7–4.0)
MCH: 33.5 pg (ref 26.0–34.0)
MCHC: 33.1 g/dL (ref 30.0–36.0)
MCV: 101.2 fL — ABNORMAL HIGH (ref 80.0–100.0)
Monocytes Absolute: 0.7 10*3/uL (ref 0.1–1.0)
Monocytes Relative: 8 %
Neutro Abs: 6.1 10*3/uL (ref 1.7–7.7)
Neutrophils Relative %: 66 %
Platelets: 514 10*3/uL — ABNORMAL HIGH (ref 150–400)
RBC: 4.27 MIL/uL (ref 4.22–5.81)
RDW: 13.3 % (ref 11.5–15.5)
WBC: 9.2 10*3/uL (ref 4.0–10.5)
nRBC: 0 % (ref 0.0–0.2)

## 2019-09-12 LAB — COMPREHENSIVE METABOLIC PANEL
ALT: 43 U/L (ref 0–44)
AST: 53 U/L — ABNORMAL HIGH (ref 15–41)
Albumin: 3.3 g/dL — ABNORMAL LOW (ref 3.5–5.0)
Alkaline Phosphatase: 64 U/L (ref 38–126)
Anion gap: 16 — ABNORMAL HIGH (ref 5–15)
BUN: 39 mg/dL — ABNORMAL HIGH (ref 8–23)
CO2: 26 mmol/L (ref 22–32)
Calcium: 9.3 mg/dL (ref 8.9–10.3)
Chloride: 92 mmol/L — ABNORMAL LOW (ref 98–111)
Creatinine, Ser: 1.26 mg/dL — ABNORMAL HIGH (ref 0.61–1.24)
GFR calc Af Amer: >60 mL/min (ref >60)
GFR calc non Af Amer: 55 mL/min — ABNORMAL LOW (ref >60)
Glucose, Bld: 193 mg/dL — ABNORMAL HIGH (ref 70–99)
Potassium: 3.5 mmol/L (ref 3.5–5.1)
Sodium: 134 mmol/L — ABNORMAL LOW (ref 135–145)
Total Bilirubin: 0.8 mg/dL (ref 0.3–1.2)
Total Protein: 7.9 g/dL (ref 6.5–8.1)

## 2019-09-12 LAB — GLUCOSE, CAPILLARY
Glucose-Capillary: 146 mg/dL — ABNORMAL HIGH (ref 70–99)
Glucose-Capillary: 150 mg/dL — ABNORMAL HIGH (ref 70–99)
Glucose-Capillary: 160 mg/dL — ABNORMAL HIGH (ref 70–99)
Glucose-Capillary: 208 mg/dL — ABNORMAL HIGH (ref 70–99)
Glucose-Capillary: 210 mg/dL — ABNORMAL HIGH (ref 70–99)

## 2019-09-12 LAB — MRSA PCR SCREENING: MRSA by PCR: NEGATIVE

## 2019-09-12 LAB — MAGNESIUM: Magnesium: 2.2 mg/dL (ref 1.7–2.4)

## 2019-09-12 LAB — D-DIMER, QUANTITATIVE: D-Dimer, Quant: 0.83 ug/mL-FEU — ABNORMAL HIGH (ref 0.00–0.50)

## 2019-09-12 LAB — C-REACTIVE PROTEIN: CRP: 1.9 mg/dL — ABNORMAL HIGH (ref <1.0)

## 2019-09-12 LAB — FERRITIN: Ferritin: 178 ng/mL (ref 24–336)

## 2019-09-12 MED ORDER — FUROSEMIDE 10 MG/ML IJ SOLN
20.0000 mg | Freq: Once | INTRAMUSCULAR | Status: AC
  Administered 2019-09-12: 20 mg via INTRAVENOUS
  Filled 2019-09-12: qty 2

## 2019-09-12 MED ORDER — FUROSEMIDE 10 MG/ML IJ SOLN
80.0000 mg | Freq: Three times a day (TID) | INTRAMUSCULAR | Status: DC
  Administered 2019-09-12 – 2019-09-13 (×4): 80 mg via INTRAVENOUS
  Filled 2019-09-12 (×4): qty 8

## 2019-09-12 MED ORDER — GABAPENTIN 300 MG PO CAPS
600.0000 mg | ORAL_CAPSULE | Freq: Three times a day (TID) | ORAL | Status: DC
  Administered 2019-09-12 – 2019-09-25 (×40): 600 mg via ORAL
  Filled 2019-09-12 (×40): qty 2

## 2019-09-12 NOTE — Progress Notes (Signed)
Chase Cabrera  IOX:[id number] DOB: 1946/08/10 DOA: 09/09/2019 PCP: McLean-Scocuzza, Nino Glow, MD    Brief Narrative:  73 year old with a history of CHF, CAD, Crohn's disease, DM 2, GERD, HTN, gout, and HLD who presented to [facility] 12/22 with acute onset shortness of breath and generalized weakness which have been present for 4 days.  In the ED he was found to have a temperature of 102 and saturations of 90% on room air.  He was found to be Covid positive and a CXR noted pulmonary vascular congestion.  He was admitted and treatment was initiated with remdesivir and Decadron.  12/27 he was transferred to [facility].  Significant Events: 12/22 admit to [facility] 12/27 transfer to [facility]  COVID-19 specific Treatment: Decadron 12/22 > 12/28 Remdesivir 12/22 > 12/26 Actemra 12/24  Antimicrobials:  Azithromycin 12/22 Rocephin 12/22  Subjective: Calm and much more interactive today.  Tells me he feels short of breath.  Denies chest pain nausea or vomiting.  Reports being hungry.  Assessment & Plan:  COVID Pneumonia - acute hypoxic respiratory failure Completed courses of remdesivir and decadron - was dosed with Actemra - in no apparent acute distress but continues to require significant oxygen support -WBC normal and patient is afebrile  Recent Labs  Lab 09/09/19 0706 09/09/19 2100 09/10/19 0423 09/11/19 0220 09/12/19 0030  DDIMER  --  0.83* 0.75* 0.73* 0.83*  FERRITIN 248 207 209 178 178  CRP 1.9* 2.5* 1.4* 1.7* 1.9*  ALT 36 33 37 41 79  PROCALCITON  --  <0.10  --   --   --     Hyponatremia Sodium approaching normal with diuresis  Acute kidney injury Creatinine 1.04 October this year - creatinine improving with diuresis -follow trend  Chronic atrial fibrillation Sinus bradycardia at present -does not appear to be on chronic anticoagulation  Chronic combined systolic and diastolic CHF EF 26-83% via TTE Jan 2020 - typically treated with Lasix 80 mg QOD - baseline weight  approximately 86kg per old records   Filed Weights   09/09/19 1821  Weight: 82.1 kg    DM 2 uncontrolled with peripheral neuropathy and hyperglycemia CBG improved   HLD Continue usual home medication  BPH Continue usual home medication  DVT prophylaxis: Lovenox Code Status: FULL CODE Family Communication:  Disposition Plan: Discontinue telemetry  Consultants:  none  Objective: Blood pressure 103/86, pulse (!) 58, temperature 98.4 F (36.9 C), temperature source Oral, resp. rate 16, height 5' 8"  (1.727 m), weight 82.1 kg, SpO2 (!) 89 %.  Intake/Output Summary (Last 24 hours) at 09/12/2019 0954 Last data filed at 09/12/2019 0600 Gross per 24 hour  Intake 1695 ml  Output 1575 ml  Net 120 ml   Filed Weights   09/09/19 1821  Weight: 82.1 kg    Examination: General: No acute respiratory distress Lungs: Fine diffuse crackles - improved air movement  Cardiovascular: No murmur or rub -regular Abdomen: NT/ND, soft, BS positive Extremities: No signif edema B EL   CBC: Recent Labs  Lab 09/10/19 0423 09/11/19 0220 09/12/19 0030  WBC 10.2 9.6 9.2  NEUTROABS 9.0* 8.4* 6.1  HGB 13.4 14.0 14.3  HCT 39.6 41.2 43.2  MCV 99.5 100.7* 101.2*  PLT 502* 483* 419*   Basic Metabolic Panel: Recent Labs  Lab 09/09/19 2100 09/10/19 0423 09/11/19 0220 09/12/19 0030  NA 131* 131* 133* 134*  K 3.8 4.2 4.2 3.5  CL 91* 91* 95* 92*  CO2 27 25 26 26   GLUCOSE 190* 303* 267*  193*  BUN 39* 38* 36* 39*  CREATININE 1.61* 1.45* 1.38* 1.26*  CALCIUM 8.8* 8.8* 8.9 9.3  MG 2.0 2.0 2.3 2.2  PHOS 2.9 2.8 2.7  --    GFR: Estimated Creatinine Clearance: 52.9 mL/min (A) (by C-G formula based on SCr of 1.26 mg/dL (H)).  Liver Function Tests: Recent Labs  Lab 09/09/19 2100 09/10/19 0423 09/11/19 0220 09/12/19 0030  AST 44* 42* 48* 53*  ALT 33 37 41 43  ALKPHOS 55 62 65 64  BILITOT 0.9 0.5 1.0 0.8  PROT 7.1 7.3 7.2 7.9  ALBUMIN 3.1* 2.9* 3.1* 3.3*    HbA1C: Hgb A1c MFr Bld   Date/Time Value Ref Range Status  09/11/2019 02:20 AM 7.1 (H) 4.8 - 5.6 % Final    Comment:    (NOTE) Pre diabetes:          5.7%-6.4% Diabetes:              >6.4% Glycemic control for   <7.0% adults with diabetes   04/30/2018 04:18 PM 7.1 (H) 4.8 - 5.6 % Final    Comment:    (NOTE) Pre diabetes:          5.7%-6.4% Diabetes:              >6.4% Glycemic control for   <7.0% adults with diabetes     CBG: Recent Labs  Lab 09/11/19 0722 09/11/19 1127 09/11/19 1553 09/11/19 2353 09/12/19 0832  GLUCAP 198* 283* 252* 208* 160*    Recent Results (from the past 240 hour(s))  CULTURE, BLOOD (ROUTINE X 2) w Reflex to ID Panel     Status: None   Collection Time: 09/04/19  6:38 PM   Specimen: BLOOD  Result Value Ref Range Status   Specimen Description BLOOD RIGHT ANTECUBITAL  Final   Special Requests   Final    BOTTLES DRAWN AEROBIC AND ANAEROBIC Blood Culture adequate volume   Culture   Final    NO GROWTH 5 DAYS Performed at [facility], [address] [place], [place] [address]    Report Status 09/09/2019 FINAL  Final  CULTURE, BLOOD (ROUTINE X 2) w Reflex to ID Panel     Status: None   Collection Time: 09/04/19  6:40 PM   Specimen: BLOOD  Result Value Ref Range Status   Specimen Description BLOOD LEFT ANTECUBITAL  Final   Special Requests   Final    BOTTLES DRAWN AEROBIC AND ANAEROBIC Blood Culture adequate volume   Culture   Final    NO GROWTH 5 DAYS Performed at [facility], [address] [place], [place] [address]    Report Status 09/09/2019 FINAL  Final  Respiratory Panel by PCR     Status: None   Collection Time: 09/09/19  6:50 PM   Specimen: Nasopharyngeal Swab; Respiratory  Result Value Ref Range Status   Adenovirus NOT DETECTED NOT DETECTED Final   Coronavirus 229E NOT DETECTED NOT DETECTED Final    Comment: (NOTE) The Coronavirus on the Respiratory Panel, DOES NOT test for the novel  Coronavirus (2019 nCoV)    Coronavirus HKU1  NOT DETECTED NOT DETECTED Final   Coronavirus NL63 NOT DETECTED NOT DETECTED Final   Coronavirus OC43 NOT DETECTED NOT DETECTED Final   Metapneumovirus NOT DETECTED NOT DETECTED Final   Rhinovirus / Enterovirus NOT DETECTED NOT DETECTED Final   Influenza A NOT DETECTED NOT DETECTED Final   Influenza B NOT DETECTED NOT DETECTED Final   Parainfluenza Virus 1 NOT  DETECTED NOT DETECTED Final   Parainfluenza Virus 2 NOT DETECTED NOT DETECTED Final   Parainfluenza Virus 3 NOT DETECTED NOT DETECTED Final   Parainfluenza Virus 4 NOT DETECTED NOT DETECTED Final   Respiratory Syncytial Virus NOT DETECTED NOT DETECTED Final   Bordetella pertussis NOT DETECTED NOT DETECTED Final   Chlamydophila pneumoniae NOT DETECTED NOT DETECTED Final   Mycoplasma pneumoniae NOT DETECTED NOT DETECTED Final    Comment: Performed at [facility], [place] [address] [place], [place] [address]  Culture, blood (Routine X 2) w Reflex to ID Panel     Status: None (Preliminary result)   Collection Time: 09/09/19  9:00 PM   Specimen: BLOOD RIGHT HAND  Result Value Ref Range Status   Specimen Description   Final    BLOOD RIGHT HAND Performed at [facility] [address] [place], [place] [address]    Special Requests   Final    BOTTLES DRAWN AEROBIC ONLY Blood Culture adequate volume Performed at [facility] [address], [place], [place] [address]    Culture   Final    NO GROWTH 1 DAY Performed at [facility], [place] [address], [place], [place] [address]    Report Status PENDING  Incomplete  Culture, blood (Routine X 2) w Reflex to ID Panel     Status: None (Preliminary result)   Collection Time: 09/09/19  9:00 PM   Specimen: BLOOD LEFT HAND  Result Value Ref Range Status   Specimen Description   Final    BLOOD LEFT HAND Performed at [facility] [address], [place], [place] [address]    Special Requests   Final    BOTTLES DRAWN  AEROBIC ONLY Blood Culture adequate volume Performed at [facility] [address] [place], [place] [address]    Culture   Final    NO GROWTH 1 DAY Performed at [facility], [place] [address], [place], [place] [address]    Report Status PENDING  Incomplete  MRSA PCR Screening     Status: None   Collection Time: 09/12/19  4:03 AM   Specimen: Nasal Mucosa; Nasopharyngeal  Result Value Ref Range Status   MRSA by PCR NEGATIVE NEGATIVE Final    Comment:        The GeneXpert MRSA Assay (FDA approved for NASAL specimens only), is one component of a comprehensive MRSA colonization surveillance program. It is not intended to diagnose MRSA infection nor to guide or monitor treatment for MRSA infections. Performed at [facility], [place] [address] [place], [place] [address]      Scheduled Meds: . vitamin C  500 mg Oral Daily  . aspirin EC  81 mg Oral Daily  . carvedilol  25 mg Oral BID WC  . cetirizine  10 mg Oral Daily  . cloNIDine  0.1 mg Oral BID  . clopidogrel  75 mg Oral Daily  . colchicine  0.6 mg Oral Daily  . dicyclomine  10 mg Oral TID AC  . docusate sodium  100 mg Oral BID  . enoxaparin (LOVENOX) injection  40 mg Subcutaneous Q24H  . folic acid  1 mg Oral Daily  . furosemide  60 mg Intravenous Q12H  . gabapentin  300 mg Oral TID  . insulin aspart  0-20 Units Subcutaneous TID WC  . insulin aspart  0-5 Units Subcutaneous QHS  . insulin detemir  30 Units Subcutaneous BID  .  ipratropium  2 spray Each Nare QID  . isosorbide mononitrate  60 mg Oral Daily  . montelukast  10 mg Oral QHS  . pantoprazole  40 mg Oral Daily  . pravastatin  40 mg Oral Daily  . probenecid  1,000 mg Oral BID  . sodium chloride flush  3 mL Intravenous Q12H  . spironolactone  12.5 mg Oral Daily  . thiamine  100 mg Oral Daily  . zinc sulfate  220 mg Oral Daily     LOS: 3 days   Cherene Altes, MD [facility] Office  [phone] Pager -  Text Page per Amion  If 7PM-7AM, please contact night-coverage per Amion 09/12/2019, 9:54 AM

## 2019-09-12 NOTE — Progress Notes (Signed)
Attempted to call wife, Shahin Knierim, but was unable to speak with her. Spoke with son, Zollie Ellery, and gave update on patients status.

## 2019-09-13 LAB — GLUCOSE, CAPILLARY
Glucose-Capillary: 194 mg/dL — ABNORMAL HIGH (ref 70–99)
Glucose-Capillary: 222 mg/dL — ABNORMAL HIGH (ref 70–99)
Glucose-Capillary: 197 mg/dL — ABNORMAL HIGH (ref 70–99)
Glucose-Capillary: 274 mg/dL — ABNORMAL HIGH (ref 70–99)

## 2019-09-13 LAB — BASIC METABOLIC PANEL
Anion gap: 21 — ABNORMAL HIGH (ref 5–15)
BUN: 39 mg/dL — ABNORMAL HIGH (ref 8–23)
CO2: 21 mmol/L — ABNORMAL LOW (ref 22–32)
Calcium: 9.4 mg/dL (ref 8.9–10.3)
Chloride: 89 mmol/L — ABNORMAL LOW (ref 98–111)
Creatinine, Ser: 1.51 mg/dL — ABNORMAL HIGH (ref 0.61–1.24)
GFR calc Af Amer: 52 mL/min — ABNORMAL LOW (ref >60)
GFR calc non Af Amer: 45 mL/min — ABNORMAL LOW (ref >60)
Glucose, Bld: 162 mg/dL — ABNORMAL HIGH (ref 70–99)
Potassium: 3.6 mmol/L (ref 3.5–5.1)
Sodium: 131 mmol/L — ABNORMAL LOW (ref 135–145)

## 2019-09-13 MED ORDER — FUROSEMIDE 10 MG/ML IJ SOLN
80.0000 mg | Freq: Every day | INTRAMUSCULAR | Status: DC
  Administered 2019-09-14: 80 mg via INTRAVENOUS
  Filled 2019-09-13: qty 8

## 2019-09-13 NOTE — Progress Notes (Signed)
Spoke with son and gave update on patients status. Patient spoke with him for a while.

## 2019-09-13 NOTE — Progress Notes (Signed)
Occupational Therapy Evaluation Patient Details Name: Chase Cabrera MRN: [id number] DOB: 1945/11/27 Today's Date: 09/13/2019    History of Present Illness 73 year old with a history of CHF, CAD, Crohn's disease, DM 2, GERD, HTN, gout, and HLD who presented to [facility] 12/22 and was admitted  with acute onset shortness of breath and generalized weakness , temperature of 102 and saturations of 90% on room air.  He was found to be Covid positive and a CXR noted pulmonary vascular congestion.  12/27 he was transferred to [facility].   Clinical Impression   PTA pt lived with his wife, independent in all self-care and mobility tasks. Pt ambulates with a RW, reporting 0 falls in the last 6 months. Pt still drives. Pt does not use oxygen at home and is currently on 8L HFNC. Pt currently requires supervision to max assist for self-care and functional transfer tasks. Pt able to complete figure four pattern to don/doff socks with min assist while seated in bedside chair. Pt completed simple hand and face washing task while seated. Pt tolerated standing 1 x 15 sec with RW and mod assist. SpO2 decreased to mid 80s during activities. Pt required ~4 min seated rest break for return back to 90s. 2/4 DOE. Educated pt on breathing exercises with fair understanding and follow through. Pt extremely fatigued at start of session requiring increased time and cues to complete all tasks. Pt demonstrates decreased strength, endurance, balance, standing tolerance, and activity tolerance impacting ability to complete self-care and functional transfer tasks. Recommend skilled OT services to address above deficits in order to promote function and prevent further decline. Recommend SNF placement for additional rehab prior to discharge home.    Follow Up Recommendations  SNF    Equipment Recommendations  Other (comment)(TBD at next venue of care)    Recommendations for Other Services       Precautions / Restrictions  Precautions Precautions: Fall Precaution Comments: SpO2 slow to respond to O2 adjustments Restrictions Weight Bearing Restrictions: No      Mobility Bed Mobility Overal bed mobility: Needs Assistance Bed Mobility: Supine to Sit     Supine to sit: Min assist     General bed mobility comments: Pt seated in bedside chair upon OT arrival.  Transfers Overall transfer level: Needs assistance Equipment used: Rolling walker (2 wheeled) Transfers: Sit to/from Stand Sit to Stand: Mod assist Stand pivot transfers: Max assist;+2 physical assistance;+2 safety/equipment       General transfer comment: Pt completed one sit/stand with RW requiring mod assist to complete. Pt tolerated standing less than 15 seconds before having to sit due to fatigue.    Balance Overall balance assessment: Needs assistance Sitting-balance support: Feet supported;Bilateral upper extremity supported Sitting balance-Leahy Scale: Fair     Standing balance support: Bilateral upper extremity supported;During functional activity Standing balance-Leahy Scale: Poor Standing balance comment: pt requiring UE support for static standing balance                           ADL either performed or assessed with clinical judgement   ADL Overall ADL's : Needs assistance/impaired Eating/Feeding: Set up;Supervision/ safety;Sitting   Grooming: Supervision/safety;Set up;Wash/dry hands;Wash/dry face;Sitting   Upper Body Bathing: Minimal assistance;Sitting   Lower Body Bathing: Minimal assistance;Moderate assistance;Sitting/lateral leans;Sit to/from stand   Upper Body Dressing : Minimal assistance;Sitting   Lower Body Dressing: Minimal assistance;Moderate assistance;Sit to/from stand;Sitting/lateral leans   Toilet Transfer: Moderate assistance;Stand-pivot;BSC;Maximal assistance   Toileting- Clothing Manipulation and  Hygiene: Moderate assistance;Maximal assistance;Sit to/from stand;Sitting/lateral lean        Functional mobility during ADLs: (Not attempted this date) General ADL Comments: Not attempted this date due to pt fatigue, nausea, and confusion.     Vision Baseline Vision/History: Wears glasses Wears Glasses: At all times       Perception     Praxis      Pertinent Vitals/Pain Pain Assessment: No/denies pain     Hand Dominance Right   Extremity/Trunk Assessment Upper Extremity Assessment Upper Extremity Assessment: Generalized weakness   Lower Extremity Assessment Lower Extremity Assessment: Defer to PT evaluation       Communication Communication Communication: HOH   Cognition Arousal/Alertness: Lethargic Behavior During Therapy: Flat affect Overall Cognitive Status: No family/caregiver present to determine baseline cognitive functioning Area of Impairment: Memory;Orientation;Safety/judgement                 Orientation Level: Time       Safety/Judgement: Decreased awareness of safety;Decreased awareness of deficits     General Comments: Pt initially lethargic at start of session requiring increased time and cues to respond to questions. Pt more alert and oriented towards end of session.   General Comments  Pt on 8L HFNC with SpO2 97% at rest. Pt's SpO2 decreased to mid 80s during sit/stand transfer with pt requiring 4 min seated rest break to return back to 90s.     Exercises Exercises: Other exercises Other Exercises Other Exercises: Incentive spirometer x 10. Pulling 570m Other Exercises: Flutter valve x 10   Shoulder Instructions      Home Living Family/patient expects to be discharged to:: Private residence Living Arrangements: Spouse/significant other;Other relatives Available Help at Discharge: Family Type of Home: House Home Access: Level entry     Home Layout: One level     Bathroom Shower/Tub: WOccupational psychologist Standard     Home Equipment: WEnvironmental consultant- 2 wheels;Shower seat;Wheelchair - power           Prior Functioning/Environment Level of Independence: Independent with assistive device(s)        Comments: Pt reports being modified independent in self-care and mobility tasks. Pt reports that wife and granddaughter complete IADLs. Pt still drives. Pt reports 0 falls in the last 6 months. Pt does not wear oxygen at home.        OT Problem List: Decreased strength;Decreased activity tolerance;Impaired balance (sitting and/or standing);Decreased safety awareness;Decreased knowledge of use of DME or AE;Cardiopulmonary status limiting activity      OT Treatment/Interventions: Self-care/ADL training;Therapeutic exercise;Neuromuscular education;Energy conservation;DME and/or AE instruction;Therapeutic activities;Patient/family education;Balance training    OT Goals(Current goals can be found in the care plan section) Acute Rehab OT Goals Patient Stated Goal: Pt agreeable to standing up Time For Goal Achievement: 09/27/19 Potential to Achieve Goals: Good ADL Goals Pt Will Perform Grooming: with supervision;standing Pt Will Perform Lower Body Bathing: with supervision;sitting/lateral leans;sit to/from stand Pt Will Perform Lower Body Dressing: sitting/lateral leans;sit to/from stand;with supervision Pt Will Transfer to Toilet: with min assist;bedside commode Pt Will Perform Toileting - Clothing Manipulation and hygiene: with min assist;sit to/from stand;sitting/lateral leans Additional ADL Goal #1: Pt to demonstrate breathing exercises with 0 verbal cues. Additional ADL Goal #2: Pt to stand up to 5 min with min guard, in preparation for ADLs.  OT Frequency: Min 2X/week   Barriers to D/C:            Co-evaluation  AM-PAC OT "6 Clicks" Daily Activity     Outcome Measure Help from another person eating meals?: A Little Help from another person taking care of personal grooming?: A Little Help from another person toileting, which includes using toliet, bedpan, or  urinal?: A Lot Help from another person bathing (including washing, rinsing, drying)?: A Lot Help from another person to put on and taking off regular upper body clothing?: A Little Help from another person to put on and taking off regular lower body clothing?: A Lot 6 Click Score: 15   End of Session Equipment Utilized During Treatment: Rolling walker Nurse Communication: Mobility status  Activity Tolerance: Patient limited by fatigue;Patient limited by lethargy(Limited by SOB) Patient left: in chair;with call bell/phone within reach;with chair alarm set  OT Visit Diagnosis: Unsteadiness on feet (R26.81);Muscle weakness (generalized) (M62.81)                Time: 2725-3664 OT Time Calculation (min): 28 min Charges:  OT General Charges $OT Visit: 1 Visit OT Evaluation $OT Eval Moderate Complexity: 1 Mod OT Treatments $Self Care/Home Management : 8-22 mins  Mauri Brooklyn OTR/L [phone]   Mauri Brooklyn 09/13/2019, 5:17 PM

## 2019-09-13 NOTE — Plan of Care (Signed)
  Problem: Education: Goal: Knowledge of risk factors and measures for prevention of condition will improve Outcome: Progressing   Problem: Coping: Goal: Psychosocial and spiritual needs will be supported Outcome: Progressing   Problem: Respiratory: Goal: Will maintain a patent airway Outcome: Progressing Goal: Complications related to the disease process, condition or treatment will be avoided or minimized Outcome: Progressing   

## 2019-09-13 NOTE — Progress Notes (Signed)
Attempted to cal pt's wife [name] for updates. No answer. Son was called and updated on pt's status. Pt is not in any acute distress at this time. Will continue to monitor.

## 2019-09-13 NOTE — Progress Notes (Signed)
Chase Cabrera  HQP:[id number] DOB: [birth year] DOA: 09/09/2019 PCP: McLean-Scocuzza, Nino Glow, MD    Brief Narrative:  73 year old with a history of CHF, CAD, Crohn's disease, DM 2, GERD, HTN, gout, and HLD who presented to [facility] 12/22 with acute onset shortness of breath and generalized weakness which have been present for 4 days.  In the ED he was found to have a temperature of 102 and saturations of 90% on room air.  He was found to be Covid positive and a CXR noted pulmonary vascular congestion.  He was admitted and treatment was initiated with remdesivir and Decadron.  12/27 he was transferred to [facility].  Significant Events: 12/22 admit to [facility] 12/27 transfer to [facility]  COVID-19 specific Treatment: Decadron 12/22 > 12/28 Remdesivir 12/22 > 12/26 Actemra 12/24  Antimicrobials:  Azithromycin 12/22 Rocephin 12/22  Subjective: Continues to require high flow nasal cannula at 6 L.  Creatinine beginning to climb with ongoing aggressive diuresis.  More lethargic today.  Assessment & Plan:  COVID Pneumonia - acute hypoxic respiratory failure Completed courses of remdesivir and decadron - was dosed with Actemra - in no apparent acute distress but continues to require significant oxygen support -WBC normal and patient is afebrile  Hyponatremia Follow trend   Acute kidney injury Creatinine 1.04 October this year - creatinine trending up w/ diuresis - slow diuresis today   Chronic atrial fibrillation Sinus bradycardia at present - does not appear to be on chronic anticoagulation  Chronic combined systolic and diastolic CHF EF 59-93% via TTE Jan 2020 - typically treated with Lasix 80 mg QOD - baseline weight approximately 86kg per old records - net negative ~3L since admit   Filed Weights   09/09/19 1821  Weight: 82.1 kg    DM 2 uncontrolled with peripheral neuropathy and hyperglycemia CBG improved   HLD Continue usual home medication  BPH Continue usual home  medication  DVT prophylaxis: Lovenox Code Status: FULL CODE Family Communication:  Disposition Plan: med/surg - wean oxygen as able   Consultants:  none  Objective: Blood pressure 123/75, pulse 60, temperature 98.3 F (36.8 C), temperature source Oral, resp. rate 16, height 5' 8"  (1.727 m), weight 82.1 kg, SpO2 90 %.  Intake/Output Summary (Last 24 hours) at 09/13/2019 1020 Last data filed at 09/13/2019 0600 Gross per 24 hour  Intake 240 ml  Output 1225 ml  Net -985 ml   Filed Weights   09/09/19 1821  Weight: 82.1 kg    Examination: General: Lethargic, requiring ongoing high level oxygen support Lungs: Fine crackles persist diffusely with no wheezing Cardiovascular: No murmur or rub -regular Abdomen: NT/ND, soft, BS positive Extremities: No appreciable edema bilateral lower extremities  CBC: Recent Labs  Lab 09/10/19 0423 09/11/19 0220 09/12/19 0030  WBC 10.2 9.6 9.2  NEUTROABS 9.0* 8.4* 6.1  HGB 13.4 14.0 14.3  HCT 39.6 41.2 43.2  MCV 99.5 100.7* 101.2*  PLT 502* 483* 570*   Basic Metabolic Panel: Recent Labs  Lab 09/09/19 2100 09/10/19 0423 09/11/19 0220 09/12/19 0030 09/13/19 0111  NA 131* 131* 133* 134* 131*  K 3.8 4.2 4.2 3.5 3.6  CL 91* 91* 95* 92* 89*  CO2 27 25 26 26  21*  GLUCOSE 190* 303* 267* 193* 162*  BUN 39* 38* 36* 39* 39*  CREATININE 1.61* 1.45* 1.38* 1.26* 1.51*  CALCIUM 8.8* 8.8* 8.9 9.3 9.4  MG 2.0 2.0 2.3 2.2  --   PHOS 2.9 2.8 2.7  --   --  GFR: Estimated Creatinine Clearance: 44.2 mL/min (A) (by C-G formula based on SCr of 1.51 mg/dL (H)).  Liver Function Tests: Recent Labs  Lab 09/09/19 2100 09/10/19 0423 09/11/19 0220 09/12/19 0030  AST 44* 42* 48* 53*  ALT 33 37 41 43  ALKPHOS 55 62 65 64  BILITOT 0.9 0.5 1.0 0.8  PROT 7.1 7.3 7.2 7.9  ALBUMIN 3.1* 2.9* 3.1* 3.3*    HbA1C: Hgb A1c MFr Bld  Date/Time Value Ref Range Status  09/11/2019 02:20 AM 7.1 (H) 4.8 - 5.6 % Final    Comment:    (NOTE) Pre diabetes:           5.7%-6.4% Diabetes:              >6.4% Glycemic control for   <7.0% adults with diabetes   04/30/2018 04:18 PM 7.1 (H) 4.8 - 5.6 % Final    Comment:    (NOTE) Pre diabetes:          5.7%-6.4% Diabetes:              >6.4% Glycemic control for   <7.0% adults with diabetes     CBG: Recent Labs  Lab 09/12/19 0832 09/12/19 1236 09/12/19 1608 09/12/19 2231 09/13/19 0711  GLUCAP 160* 146* 210* 150* 194*    Recent Results (from the past 240 hour(s))  CULTURE, BLOOD (ROUTINE X 2) w Reflex to ID Panel     Status: None   Collection Time: 09/04/19  6:38 PM   Specimen: BLOOD  Result Value Ref Range Status   Specimen Description BLOOD RIGHT ANTECUBITAL  Final   Special Requests   Final    BOTTLES DRAWN AEROBIC AND ANAEROBIC Blood Culture adequate volume   Culture   Final    NO GROWTH 5 DAYS Performed at [facility], [address] [place], [place] [address]    Report Status 09/09/2019 FINAL  Final  CULTURE, BLOOD (ROUTINE X 2) w Reflex to ID Panel     Status: None   Collection Time: 09/04/19  6:40 PM   Specimen: BLOOD  Result Value Ref Range Status   Specimen Description BLOOD LEFT ANTECUBITAL  Final   Special Requests   Final    BOTTLES DRAWN AEROBIC AND ANAEROBIC Blood Culture adequate volume   Culture   Final    NO GROWTH 5 DAYS Performed at [facility], [address] [place], [place] [address]    Report Status 09/09/2019 FINAL  Final  Respiratory Panel by PCR     Status: None   Collection Time: 09/09/19  6:50 PM   Specimen: Nasopharyngeal Swab; Respiratory  Result Value Ref Range Status   Adenovirus NOT DETECTED NOT DETECTED Final   Coronavirus 229E NOT DETECTED NOT DETECTED Final    Comment: (NOTE) The Coronavirus on the Respiratory Panel, DOES NOT test for the novel  Coronavirus (2019 nCoV)    Coronavirus HKU1 NOT DETECTED NOT DETECTED Final   Coronavirus NL63 NOT DETECTED NOT DETECTED Final   Coronavirus OC43 NOT DETECTED NOT  DETECTED Final   Metapneumovirus NOT DETECTED NOT DETECTED Final   Rhinovirus / Enterovirus NOT DETECTED NOT DETECTED Final   Influenza A NOT DETECTED NOT DETECTED Final   Influenza B NOT DETECTED NOT DETECTED Final   Parainfluenza Virus 1 NOT DETECTED NOT DETECTED Final   Parainfluenza Virus 2 NOT DETECTED NOT DETECTED Final   Parainfluenza Virus 3 NOT DETECTED NOT DETECTED Final   Parainfluenza Virus 4 NOT DETECTED NOT DETECTED Final  Respiratory Syncytial Virus NOT DETECTED NOT DETECTED Final   Bordetella pertussis NOT DETECTED NOT DETECTED Final   Chlamydophila pneumoniae NOT DETECTED NOT DETECTED Final   Mycoplasma pneumoniae NOT DETECTED NOT DETECTED Final    Comment: Performed at [facility], [place] [address] [place], [place] [address]  Culture, blood (Routine X 2) w Reflex to ID Panel     Status: None (Preliminary result)   Collection Time: 09/09/19  9:00 PM   Specimen: BLOOD RIGHT HAND  Result Value Ref Range Status   Specimen Description   Final    BLOOD RIGHT HAND Performed at [place] [facility] [address], [place], [place] [address]    Special Requests   Final    BOTTLES DRAWN AEROBIC ONLY Blood Culture adequate volume Performed at [place] [address] [place], [place] [address]    Culture   Final    NO GROWTH 2 DAYS Performed at [place] [address] [place], [place] [address]    Report Status PENDING  Incomplete  Culture, blood (Routine X 2) w Reflex to ID Panel     Status: None (Preliminary result)   Collection Time: 09/09/19  9:00 PM   Specimen: BLOOD LEFT HAND  Result Value Ref Range Status   Specimen Description   Final    BLOOD LEFT HAND Performed at [place] [address] [place], [place] [address]    Special Requests   Final    BOTTLES DRAWN AEROBIC ONLY Blood Culture adequate volume Performed at [place] [address], [place],  [place] [address]    Culture   Final    NO GROWTH 2 DAYS Performed at [facility] [address], [place], [place] [address]    Report Status PENDING  Incomplete  MRSA PCR Screening     Status: None   Collection Time: 09/12/19  4:03 AM   Specimen: Nasal Mucosa; Nasopharyngeal  Result Value Ref Range Status   MRSA by PCR NEGATIVE NEGATIVE Final    Comment:        The GeneXpert MRSA Assay (FDA approved for NASAL specimens only), is one component of a comprehensive MRSA colonization surveillance program. It is not intended to diagnose MRSA infection nor to guide or monitor treatment for MRSA infections. Performed at [facility], [place] [address], [place], [place] [address]      Scheduled Meds: . vitamin C  500 mg Oral Daily  . aspirin EC  81 mg Oral Daily  . carvedilol  25 mg Oral BID WC  . cetirizine  10 mg Oral Daily  . cloNIDine  0.1 mg Oral BID  . clopidogrel  75 mg Oral Daily  . colchicine  0.6 mg Oral Daily  . dicyclomine  10 mg Oral TID AC  . docusate sodium  100 mg Oral BID  . enoxaparin (LOVENOX) injection  40 mg Subcutaneous Q24H  . folic acid  1 mg Oral Daily  . furosemide  80 mg Intravenous Q8H  . gabapentin  600 mg Oral TID  . insulin aspart  0-20 Units Subcutaneous TID WC  . insulin aspart  0-5 Units Subcutaneous QHS  . insulin detemir  30 Units Subcutaneous BID  . ipratropium  2 spray Each Nare QID  . isosorbide mononitrate  60 mg Oral Daily  . montelukast  10 mg Oral QHS  . pantoprazole  40 mg Oral Daily  . pravastatin  40 mg Oral Daily  . probenecid  1,000 mg Oral BID  . sodium chloride flush  3 mL Intravenous Q12H  . spironolactone  12.5 mg Oral Daily  . thiamine  100 mg Oral Daily  . zinc sulfate  220 mg Oral Daily     LOS: 4 days   Cherene Altes, MD [facility] Office  [phone] Pager - Text Page per Amion  If 7PM-7AM, please contact night-coverage per Amion 09/13/2019, 10:20 AM

## 2019-09-13 NOTE — Progress Notes (Signed)
Physical Therapy Treatment Patient Details Name: Chase Cabrera MRN: [id number] DOB: Feb 23, 1946 Today's Date: 09/13/2019    History of Present Illness 73 year old with a history of CHF, CAD, Crohn's disease, DM 2, GERD, HTN, gout, and HLD who presented to [facility] 12/22 and was admitted  with acute onset shortness of breath and generalized weakness , temperature of 102 and saturations of 90% on room air.  He was found to be Covid positive and a CXR noted pulmonary vascular congestion.  12/27 he was transferred to [facility].    PT Comments    The patient received  In bed, HFNC not on nares, SPO2 80%. Replaced on 10 L with SPO2 rising to 90-945. Patient is very drowsy and reports feeling nauseated. RN notified. With much encouragement, patient did mobilize to sitting on bed edge and pivoted to recliner with 2 assisting. In comparison of PT note 1 week ago, patient presents with much weaker state and requiring more assistance. Patient is on 10 L HFNC. , dropping into mid 80's with exertion. Will monitor progress and consider DC needs of HHPT vs. SNF if progress is slow. Per chart, patient independent PTA, patient stated that he used a RW.   Follow Up Recommendations  Home health PT;Supervision/Assistance - 24 hour vs SNF if remains very weak.     Equipment Recommendations  Rolling walker with 5" wheels;3in1 (PT)    Recommendations for Other Services       Precautions / Restrictions Precautions Precautions: Fall Precaution Comments: SpO2 slow to respond to O2 adjustments    Mobility  Bed Mobility Overal bed mobility: Needs Assistance Bed Mobility: Supine to Sit     Supine to sit: Min assist     General bed mobility comments: much time to encourage  patient to mobilize to sitting, assist to initiate, assist with trunk  Transfers Overall transfer level: Needs assistance Equipment used: 2 person hand held assist Transfers: Sit to/from Stand;Stand Pivot Transfers Sit to Stand:  Max assist;+2 physical assistance;+2 safety/equipment Stand pivot transfers: Max assist;+2 physical assistance;+2 safety/equipment       General transfer comment: much assistance to partially stand from bed and pivot to recliner.Patient  barely partially stood, did reach to recliner rest and pivot ,to seat, did not fuly stand.   Ambulation/Gait                 Stairs             Wheelchair Mobility    Modified Rankin (Stroke Patients Only)       Balance Overall balance assessment: Needs assistance Sitting-balance support: Feet supported;Bilateral upper extremity supported Sitting balance-Leahy Scale: Fair     Standing balance support: Bilateral upper extremity supported;During functional activity Standing balance-Leahy Scale: Poor Standing balance comment: pt requiring UE support for static standing balance                            Cognition Arousal/Alertness: Lethargic   Overall Cognitive Status: No family/caregiver present to determine baseline cognitive functioning Area of Impairment: Memory;Orientation;Safety/judgement                 Orientation Level: Time       Safety/Judgement: Decreased awareness of safety;Decreased awareness of deficits     General Comments: Oriented to person and place and situation; some general confusion noted and increased time to respond also required, reports that he is very tired and nauseated.      Exercises  General Comments        Pertinent Vitals/Pain Pain Assessment: No/denies pain    Home Living                      Prior Function            PT Goals (current goals can now be found in the care plan section) Progress towards PT goals: Not progressing toward goals - comment(patient is much weaker than documented 1 week ago)    Frequency    Min 3X/week      PT Plan Current plan remains appropriate(may need to update, pt is much weaker)    Co-evaluation               AM-PAC PT "6 Clicks" Mobility   Outcome Measure  Help needed turning from your back to your side while in a flat bed without using bedrails?: A Little Help needed moving from lying on your back to sitting on the side of a flat bed without using bedrails?: A Little Help needed moving to and from a bed to a chair (including a wheelchair)?: A Lot Help needed standing up from a chair using your arms (e.g., wheelchair or bedside chair)?: A Lot Help needed to walk in hospital room?: Total Help needed climbing 3-5 steps with a railing? : Total 6 Click Score: 12    End of Session Equipment Utilized During Treatment: Oxygen Activity Tolerance: Patient limited by fatigue;Treatment limited secondary to medical complications (Comment) Patient left: in chair;with call bell/phone within reach;with chair alarm set Nurse Communication: Mobility status;Precautions;Other (comment) PT Visit Diagnosis: Other abnormalities of gait and mobility (R26.89);Muscle weakness (generalized) (M62.81);Difficulty in walking, not elsewhere classified (R26.2)     Time: 5732-2025 PT Time Calculation (min) (ACUTE ONLY): 34 min  Charges:  $Therapeutic Activity: 23-37 mins                     Tresa Endo PT Acute Rehabilitation Services Pager [phone] Office [phone]    Claretha Cooper 09/13/2019, 1:46 PM

## 2019-09-13 NOTE — Progress Notes (Signed)
Attempted to call wife, Bralin Garry, but was unable to speak with her. Spoke with son, Luke Falero, and gave update on patients status.

## 2019-09-14 LAB — COMPREHENSIVE METABOLIC PANEL
ALT: 45 U/L — ABNORMAL HIGH (ref 0–44)
AST: 50 U/L — ABNORMAL HIGH (ref 15–41)
Albumin: 3.3 g/dL — ABNORMAL LOW (ref 3.5–5.0)
Alkaline Phosphatase: 74 U/L (ref 38–126)
Anion gap: 16 — ABNORMAL HIGH (ref 5–15)
BUN: 44 mg/dL — ABNORMAL HIGH (ref 8–23)
CO2: 31 mmol/L (ref 22–32)
Calcium: 9.3 mg/dL (ref 8.9–10.3)
Chloride: 85 mmol/L — ABNORMAL LOW (ref 98–111)
Creatinine, Ser: 1.55 mg/dL — ABNORMAL HIGH (ref 0.61–1.24)
GFR calc Af Amer: 50 mL/min — ABNORMAL LOW (ref >60)
GFR calc non Af Amer: 43 mL/min — ABNORMAL LOW (ref >60)
Glucose, Bld: 160 mg/dL — ABNORMAL HIGH (ref 70–99)
Potassium: 3.4 mmol/L — ABNORMAL LOW (ref 3.5–5.1)
Sodium: 132 mmol/L — ABNORMAL LOW (ref 135–145)
Total Bilirubin: 0.6 mg/dL (ref 0.3–1.2)
Total Protein: 8.1 g/dL (ref 6.5–8.1)

## 2019-09-14 LAB — GLUCOSE, CAPILLARY
Glucose-Capillary: 151 mg/dL — ABNORMAL HIGH (ref 70–99)
Glucose-Capillary: 216 mg/dL — ABNORMAL HIGH (ref 70–99)
Glucose-Capillary: 213 mg/dL — ABNORMAL HIGH (ref 70–99)
Glucose-Capillary: 161 mg/dL — ABNORMAL HIGH (ref 70–99)

## 2019-09-14 LAB — CBC
HCT: 49.7 % (ref 39.0–52.0)
Hemoglobin: 16.5 g/dL (ref 13.0–17.0)
MCH: 33.6 pg (ref 26.0–34.0)
MCHC: 33.2 g/dL (ref 30.0–36.0)
MCV: 101.2 fL — ABNORMAL HIGH (ref 80.0–100.0)
Platelets: 473 10*3/uL — ABNORMAL HIGH (ref 150–400)
RBC: 4.91 MIL/uL (ref 4.22–5.81)
RDW: 13.7 % (ref 11.5–15.5)
WBC: 8.7 10*3/uL (ref 4.0–10.5)
nRBC: 0 % (ref 0.0–0.2)

## 2019-09-14 LAB — PROCALCITONIN: Procalcitonin: <0.1 ng/mL

## 2019-09-14 NOTE — Progress Notes (Signed)
Chase Cabrera  IOM:[id number] DOB: 1945/12/10 DOA: 09/09/2019 PCP: McLean-Scocuzza, Nino Glow, MD    Brief Narrative:  74 year old with a history of CHF, CAD, Crohn's disease, DM 2, GERD, HTN, gout, and HLD who presented to [facility] 12/22 with acute onset shortness of breath and generalized weakness which have been present for 4 days.  In the ED he was found to have a temperature of 102 and saturations of 90% on room air.  He was found to be Covid positive and a CXR noted pulmonary vascular congestion.  He was admitted and treatment was initiated with remdesivir and Decadron.  12/27 he was transferred to [facility].  Significant Events: 12/22 admit to [facility] 12/27 transfer to [facility]  COVID-19 specific Treatment: Decadron 12/22 > 12/28 Remdesivir 12/22 > 12/26 Actemra 12/24  Antimicrobials:  Azithromycin 12/22 Rocephin 12/22  Subjective: Has been weaned to 4 L high flow nasal cannula with sats maintaining in the upper 80s and low 90s.  Creatinine increased significantly today therefore we will stop diuretic for now.  Much more alert and interactive today.  Denies chest pain.  Reports some persisting shortness of breath.  Assessment & Plan:  COVID Pneumonia - acute hypoxic respiratory failure Completed courses of remdesivir and decadron - was dosed with Actemra - in no apparent acute distress but continues to require significant oxygen support -WBC normal and patient is afebrile  Hyponatremia Follow trend -was improving with diuresis  Acute kidney injury Creatinine 1.04 October this year - creatinine trending up w/ diuresis -stopping diuretic for now and recheck in a.m.  Chronic atrial fibrillation Sinus bradycardia at present - does not appear to be on chronic anticoagulation  Chronic combined systolic and diastolic CHF EF 84-53% via TTE Jan 2020 - typically treated with Lasix 80 mg QOD - baseline weight approximately 86kg per old records - net negative ~3L since admit   Filed  Weights   09/09/19 1821  Weight: 82.1 kg    DM 2 uncontrolled with peripheral neuropathy and hyperglycemia CBG improved   HLD Continue usual home medication  BPH Continue usual home medication  DVT prophylaxis: Lovenox Code Status: FULL CODE Family Communication:  Disposition Plan: med/surg - wean oxygen as able   Consultants:  none  Objective: Blood pressure (!) 135/45, pulse 62, temperature 98.1 F (36.7 C), temperature source Oral, resp. rate 18, height 5' 8"  (1.727 m), weight 82.1 kg, SpO2 92 %.  Intake/Output Summary (Last 24 hours) at 09/14/2019 1624 Last data filed at 09/14/2019 1250 Gross per 24 hour  Intake 963 ml  Output 900 ml  Net 63 ml   Filed Weights   09/09/19 1821  Weight: 82.1 kg    Examination: General: More alert in no acute distress Lungs: Fine crackles persist diffusely  Cardiovascular: No murmur or rub -regular rate Abdomen: NT/ND, soft, BS positive Extremities: No edema bilateral LE  CBC: Recent Labs  Lab 09/10/19 0423 09/11/19 0220 09/12/19 0030 09/14/19 0916  WBC 10.2 9.6 9.2 8.7  NEUTROABS 9.0* 8.4* 6.1  --   HGB 13.4 14.0 14.3 16.5  HCT 39.6 41.2 43.2 49.7  MCV 99.5 100.7* 101.2* 101.2*  PLT 502* 483* 514* 646*   Basic Metabolic Panel: Recent Labs  Lab 09/09/19 2100 09/10/19 0423 09/11/19 0220 09/12/19 0030 09/13/19 0111 09/14/19 0916  NA 131* 131* 133* 134* 131* 132*  K 3.8 4.2 4.2 3.5 3.6 3.4*  CL 91* 91* 95* 92* 89* 85*  CO2 27 25 26 26  21* 31  GLUCOSE 190*  303* 267* 193* 162* 160*  BUN 39* 38* 36* 39* 39* 44*  CREATININE 1.61* 1.45* 1.38* 1.26* 1.51* 1.55*  CALCIUM 8.8* 8.8* 8.9 9.3 9.4 9.3  MG 2.0 2.0 2.3 2.2  --   --   PHOS 2.9 2.8 2.7  --   --   --    GFR: Estimated Creatinine Clearance: 43 mL/min (A) (by C-G formula based on SCr of 1.55 mg/dL (H)).  Liver Function Tests: Recent Labs  Lab 09/10/19 0423 09/11/19 0220 09/12/19 0030 09/14/19 0916  AST 42* 48* 53* 50*  ALT 37 41 43 45*  ALKPHOS 62 65  64 74  BILITOT 0.5 1.0 0.8 0.6  PROT 7.3 7.2 7.9 8.1  ALBUMIN 2.9* 3.1* 3.3* 3.3*    HbA1C: Hgb A1c MFr Bld  Date/Time Value Ref Range Status  09/11/2019 02:20 AM 7.1 (H) 4.8 - 5.6 % Final    Comment:    (NOTE) Pre diabetes:          5.7%-6.4% Diabetes:              >6.4% Glycemic control for   <7.0% adults with diabetes   04/30/2018 04:18 PM 7.1 (H) 4.8 - 5.6 % Final    Comment:    (NOTE) Pre diabetes:          5.7%-6.4% Diabetes:              >6.4% Glycemic control for   <7.0% adults with diabetes     CBG: Recent Labs  Lab 09/13/19 1126 09/13/19 1611 09/13/19 2124 09/14/19 0702 09/14/19 1121  GLUCAP 222* 197* 274* 151* 216*    Recent Results (from the past 240 hour(s))  CULTURE, BLOOD (ROUTINE X 2) w Reflex to ID Panel     Status: None   Collection Time: 09/04/19  6:38 PM   Specimen: BLOOD  Result Value Ref Range Status   Specimen Description BLOOD RIGHT ANTECUBITAL  Final   Special Requests   Final    BOTTLES DRAWN AEROBIC AND ANAEROBIC Blood Culture adequate volume   Culture   Final    NO GROWTH 5 DAYS Performed at [facility], [address] [place], [place] [address]    Report Status 09/09/2019 FINAL  Final  CULTURE, BLOOD (ROUTINE X 2) w Reflex to ID Panel     Status: None   Collection Time: 09/04/19  6:40 PM   Specimen: BLOOD  Result Value Ref Range Status   Specimen Description BLOOD LEFT ANTECUBITAL  Final   Special Requests   Final    BOTTLES DRAWN AEROBIC AND ANAEROBIC Blood Culture adequate volume   Culture   Final    NO GROWTH 5 DAYS Performed at [facility], [address] [place], [place] [address]    Report Status 09/09/2019 FINAL  Final  Respiratory Panel by PCR     Status: None   Collection Time: 09/09/19  6:50 PM   Specimen: Nasopharyngeal Swab; Respiratory  Result Value Ref Range Status   Adenovirus NOT DETECTED NOT DETECTED Final   Coronavirus 229E NOT DETECTED NOT DETECTED Final    Comment: (NOTE) The  Coronavirus on the Respiratory Panel, DOES NOT test for the novel  Coronavirus (2019 nCoV)    Coronavirus HKU1 NOT DETECTED NOT DETECTED Final   Coronavirus NL63 NOT DETECTED NOT DETECTED Final   Coronavirus OC43 NOT DETECTED NOT DETECTED Final   Metapneumovirus NOT DETECTED NOT DETECTED Final   Rhinovirus / Enterovirus NOT DETECTED NOT DETECTED Final  Influenza A NOT DETECTED NOT DETECTED Final   Influenza B NOT DETECTED NOT DETECTED Final   Parainfluenza Virus 1 NOT DETECTED NOT DETECTED Final   Parainfluenza Virus 2 NOT DETECTED NOT DETECTED Final   Parainfluenza Virus 3 NOT DETECTED NOT DETECTED Final   Parainfluenza Virus 4 NOT DETECTED NOT DETECTED Final   Respiratory Syncytial Virus NOT DETECTED NOT DETECTED Final   Bordetella pertussis NOT DETECTED NOT DETECTED Final   Chlamydophila pneumoniae NOT DETECTED NOT DETECTED Final   Mycoplasma pneumoniae NOT DETECTED NOT DETECTED Final    Comment: Performed at [facility], [place] [address] [place], [place] [address]  Culture, blood (Routine X 2) w Reflex to ID Panel     Status: None (Preliminary result)   Collection Time: 09/09/19  9:00 PM   Specimen: BLOOD RIGHT HAND  Result Value Ref Range Status   Specimen Description   Final    BLOOD RIGHT HAND Performed at [place] [address] [place], [place] [address]    Special Requests   Final    BOTTLES DRAWN AEROBIC ONLY Blood Culture adequate volume Performed at [place] [address] [place], [place] [address]    Culture   Final    NO GROWTH 4 DAYS Performed at [facility], [place] [address], [place], [place] [address]    Report Status PENDING  Incomplete  Culture, blood (Routine X 2) w Reflex to ID Panel     Status: None (Preliminary result)   Collection Time: 09/09/19  9:00 PM   Specimen: BLOOD LEFT HAND  Result Value Ref Range Status   Specimen Description   Final    BLOOD LEFT HAND Performed at [place] [address] [place], [place] [address]    Special Requests   Final    BOTTLES DRAWN AEROBIC ONLY Blood Culture adequate volume Performed at [place] [address] [place], [place] [address]    Culture   Final    NO GROWTH 4 DAYS Performed at [facility], [place] [address] [place], [place] [address]    Report Status PENDING  Incomplete  MRSA PCR Screening     Status: None   Collection Time: 09/12/19  4:03 AM   Specimen: Nasal Mucosa; Nasopharyngeal  Result Value Ref Range Status   MRSA by PCR NEGATIVE NEGATIVE Final    Comment:        The GeneXpert MRSA Assay (FDA approved for NASAL specimens only), is one component of a comprehensive MRSA colonization surveillance program. It is not intended to diagnose MRSA infection nor to guide or monitor treatment for MRSA infections. Performed at [facility], [place] [address] [place], [place] [address]      Scheduled Meds: . vitamin C  500 mg Oral Daily  . aspirin EC  81 mg Oral Daily  . carvedilol  25 mg Oral BID WC  . cetirizine  10 mg Oral Daily  . cloNIDine  0.1 mg Oral BID  . clopidogrel  75 mg Oral Daily  . colchicine  0.6 mg Oral Daily  . dicyclomine  10 mg Oral TID AC  . docusate sodium  100 mg Oral BID  . enoxaparin (LOVENOX) injection  40 mg Subcutaneous Q24H  . folic acid  1 mg Oral Daily  . gabapentin  600 mg Oral TID  . insulin aspart  0-20 Units Subcutaneous TID WC  . insulin aspart  0-5  Units Subcutaneous QHS  . insulin detemir  30 Units Subcutaneous BID  . ipratropium  2 spray Each Nare QID  . isosorbide mononitrate  60 mg Oral Daily  . montelukast  10 mg Oral QHS  . pantoprazole  40 mg Oral Daily  . pravastatin  40 mg Oral Daily  . probenecid  1,000 mg Oral BID  . sodium chloride flush  3 mL Intravenous Q12H  . spironolactone  12.5 mg Oral Daily  . thiamine  100 mg Oral Daily  . zinc sulfate  220 mg Oral Daily     LOS: 5 days    Cherene Altes, MD [facility] Office  [phone] Pager - Text Page per Amion  If 7PM-7AM, please contact night-coverage per Amion 09/14/2019, 4:24 PM

## 2019-09-14 NOTE — Plan of Care (Signed)
  Problem: Education: Goal: Knowledge of risk factors and measures for prevention of condition will improve Outcome: Progressing   Problem: Coping: Goal: Psychosocial and spiritual needs will be supported Outcome: Progressing   Problem: Respiratory: Goal: Will maintain a patent airway Outcome: Progressing Goal: Complications related to the disease process, condition or treatment will be avoided or minimized Outcome: Progressing   

## 2019-09-15 LAB — COMPREHENSIVE METABOLIC PANEL
ALT: 43 U/L (ref 0–44)
AST: 51 U/L — ABNORMAL HIGH (ref 15–41)
Albumin: 3 g/dL — ABNORMAL LOW (ref 3.5–5.0)
Alkaline Phosphatase: 69 U/L (ref 38–126)
Anion gap: 17 — ABNORMAL HIGH (ref 5–15)
BUN: 46 mg/dL — ABNORMAL HIGH (ref 8–23)
CO2: 26 mmol/L (ref 22–32)
Calcium: 8.8 mg/dL — ABNORMAL LOW (ref 8.9–10.3)
Chloride: 84 mmol/L — ABNORMAL LOW (ref 98–111)
Creatinine, Ser: 1.36 mg/dL — ABNORMAL HIGH (ref 0.61–1.24)
GFR calc Af Amer: 59 mL/min — ABNORMAL LOW (ref >60)
GFR calc non Af Amer: 51 mL/min — ABNORMAL LOW (ref >60)
Glucose, Bld: 151 mg/dL — ABNORMAL HIGH (ref 70–99)
Potassium: 3.1 mmol/L — ABNORMAL LOW (ref 3.5–5.1)
Sodium: 127 mmol/L — ABNORMAL LOW (ref 135–145)
Total Bilirubin: 0.9 mg/dL (ref 0.3–1.2)
Total Protein: 7 g/dL (ref 6.5–8.1)

## 2019-09-15 LAB — CULTURE, BLOOD (ROUTINE X 2)
Culture: NO GROWTH
Culture: NO GROWTH
Special Requests: ADEQUATE
Special Requests: ADEQUATE

## 2019-09-15 LAB — CBC
HCT: 43.7 % (ref 39.0–52.0)
Hemoglobin: 14.8 g/dL (ref 13.0–17.0)
MCH: 33.9 pg (ref 26.0–34.0)
MCHC: 33.9 g/dL (ref 30.0–36.0)
MCV: 100.2 fL — ABNORMAL HIGH (ref 80.0–100.0)
Platelets: 379 10*3/uL (ref 150–400)
RBC: 4.36 MIL/uL (ref 4.22–5.81)
RDW: 13.4 % (ref 11.5–15.5)
WBC: 11.2 10*3/uL — ABNORMAL HIGH (ref 4.0–10.5)
nRBC: 0 % (ref 0.0–0.2)

## 2019-09-15 LAB — GLUCOSE, CAPILLARY
Glucose-Capillary: 141 mg/dL — ABNORMAL HIGH (ref 70–99)
Glucose-Capillary: 188 mg/dL — ABNORMAL HIGH (ref 70–99)
Glucose-Capillary: 211 mg/dL — ABNORMAL HIGH (ref 70–99)

## 2019-09-15 LAB — PROCALCITONIN: Procalcitonin: <0.1 ng/mL

## 2019-09-15 LAB — MAGNESIUM: Magnesium: 2.2 mg/dL (ref 1.7–2.4)

## 2019-09-15 MED ORDER — POTASSIUM CHLORIDE CRYS ER 20 MEQ PO TBCR
40.0000 meq | EXTENDED_RELEASE_TABLET | Freq: Three times a day (TID) | ORAL | Status: AC
  Administered 2019-09-15 – 2019-09-16 (×3): 40 meq via ORAL
  Filled 2019-09-15 (×3): qty 2

## 2019-09-15 NOTE — Plan of Care (Signed)
  Problem: Education: Goal: Knowledge of risk factors and measures for prevention of condition will improve Outcome: Progressing   Problem: Coping: Goal: Psychosocial and spiritual needs will be supported Outcome: Progressing   Problem: Respiratory: Goal: Will maintain a patent airway Outcome: Progressing Goal: Complications related to the disease process, condition or treatment will be avoided or minimized Outcome: Progressing   

## 2019-09-15 NOTE — Progress Notes (Signed)
Uneventful shift. Patient remains at 4lpm hfnc. Attempted to wean patient off of hfnc but was unsuccessful. Patient was placed on 3lpm hfnc twice this shift. Each time the patients o2 would drop and maintain around 87-88%. No falls or injuries this shift. No decrease in mental status. Pt remains alert and oriented x 3. No acute respiratory or cardiac issues this shift.Call bell within reach. Will continue to monitor.

## 2019-09-15 NOTE — Progress Notes (Signed)
Attempted to call Wife(Eula) and Son(Dean) for updates. No answer.  Voicemail left for return call if updates requested. Pt is not in any acute distress at this time. Will continue to monitor.

## 2019-09-15 NOTE — Progress Notes (Signed)
Son Marlou Sa) updated on current condition and treatment plan.

## 2019-09-15 NOTE — Progress Notes (Signed)
Chase Cabrera  VOH:[id number] DOB: 1946-01-10 DOA: 09/09/2019 PCP: McLean-Scocuzza, Nino Glow, MD    Brief Narrative:  74 year old with a history of CHF, CAD, Crohn's disease, DM 2, GERD, HTN, gout, and HLD who presented to [facility] 12/22 with acute onset shortness of breath and generalized weakness which had been present for 4 days.  In the ED he was found to have a temperature of 102 and saturations of 90% on room air.  He was found to be Covid positive and a CXR noted pulmonary vascular congestion.  He was admitted and treatment was initiated with remdesivir and Decadron.  12/27 he was transferred to [facility].  Significant Events: 12/22 admit to [facility] 12/27 transfer to [facility]  COVID-19 specific Treatment: Decadron 12/22 > 12/28 Remdesivir 12/22 > 12/26 Actemra 12/24  Antimicrobials:  Azithromycin 12/22 Rocephin 12/22  Subjective: Requiring 4 L to keep saturations 88-93%.  Creatinine has improved with holding of diuresis yesterday.  Complaining about the limited food options at [facility].  Denies chest pain or shortness of breath.  Assessment & Plan:  COVID Pneumonia - acute hypoxic respiratory failure Completed courses of remdesivir and decadron - was dosed with Actemra - in no apparent acute distress but continues to require oxygen support -WBC normal and patient is afebrile  Hyponatremia Sodium has declined with discontinuation of diuretic -monitor trend  Hypokalemia Due to diuresis -supplement -magnesium normal  Acute kidney injury Creatinine 1.04 October this year - creatinine improved with discontinuation of diuretic -recheck in a.m.  Chronic atrial fibrillation Sinus bradycardia at present - does not appear to be on chronic anticoagulation  Chronic combined systolic and diastolic CHF EF 69-48% via TTE Jan 2020 - typically treated with Lasix 80 mg QOD - baseline weight approximately 86kg per old records - net negative ~3.5L since admit   Filed Weights   09/09/19  1821  Weight: 82.1 kg    DM 2 uncontrolled with peripheral neuropathy and hyperglycemia CBG much improved   HLD Continue usual home medication  BPH Continue usual home medication  DVT prophylaxis: Lovenox Code Status: FULL CODE Family Communication:  Disposition Plan: med/surg - wean oxygen as able -mobilize -await PT/OT reevaluation but presently suggesting SNF rehab may be required  Consultants:  none  Objective: Blood pressure 115/66, pulse (!) 53, temperature 97.9 F (36.6 C), temperature source Axillary, resp. rate 16, height 5' 8"  (1.727 m), weight 82.1 kg, SpO2 92 %.  Intake/Output Summary (Last 24 hours) at 09/15/2019 0926 Last data filed at 09/15/2019 0630 Gross per 24 hour  Intake 960 ml  Output 970 ml  Net -10 ml   Filed Weights   09/09/19 1821  Weight: 82.1 kg    Examination: General: More alert in no acute distress Lungs: Fine crackles persist diffusely  Cardiovascular: No murmur or rub -regular rate Abdomen: NT/ND, soft, BS positive Extremities: No edema bilateral lower extremities  CBC: Recent Labs  Lab 09/10/19 0423 09/11/19 0220 09/12/19 0030 09/14/19 0916 09/15/19 0053  WBC 10.2 9.6 9.2 8.7 11.2*  NEUTROABS 9.0* 8.4* 6.1  --   --   HGB 13.4 14.0 14.3 16.5 14.8  HCT 39.6 41.2 43.2 49.7 43.7  MCV 99.5 100.7* 101.2* 101.2* 100.2*  PLT 502* 483* 514* 473* 546   Basic Metabolic Panel: Recent Labs  Lab 09/09/19 2100 09/10/19 0423 09/11/19 0220 09/12/19 0030 09/13/19 0111 09/14/19 0916 09/15/19 0053  NA 131* 131* 133* 134* 131* 132* 127*  K 3.8 4.2 4.2 3.5 3.6 3.4* 3.1*  CL  91* 91* 95* 92* 89* 85* 84*  CO2 27 25 26 26  21* 31 26  GLUCOSE 190* 303* 267* 193* 162* 160* 151*  BUN 39* 38* 36* 39* 39* 44* 46*  CREATININE 1.61* 1.45* 1.38* 1.26* 1.51* 1.55* 1.36*  CALCIUM 8.8* 8.8* 8.9 9.3 9.4 9.3 8.8*  MG 2.0 2.0 2.3 2.2  --   --  2.2  PHOS 2.9 2.8 2.7  --   --   --   --    GFR: Estimated Creatinine Clearance: 49.1 mL/min (A) (by C-G  formula based on SCr of 1.36 mg/dL (H)).  Liver Function Tests: Recent Labs  Lab 09/11/19 0220 09/12/19 0030 09/14/19 0916 09/15/19 0053  AST 48* 53* 50* 51*  ALT 41 43 45* 43  ALKPHOS 65 64 74 69  BILITOT 1.0 0.8 0.6 0.9  PROT 7.2 7.9 8.1 7.0  ALBUMIN 3.1* 3.3* 3.3* 3.0*    HbA1C: Hgb A1c MFr Bld  Date/Time Value Ref Range Status  09/11/2019 02:20 AM 7.1 (H) 4.8 - 5.6 % Final    Comment:    (NOTE) Pre diabetes:          5.7%-6.4% Diabetes:              >6.4% Glycemic control for   <7.0% adults with diabetes   04/30/2018 04:18 PM 7.1 (H) 4.8 - 5.6 % Final    Comment:    (NOTE) Pre diabetes:          5.7%-6.4% Diabetes:              >6.4% Glycemic control for   <7.0% adults with diabetes     CBG: Recent Labs  Lab 09/14/19 0702 09/14/19 1121 09/14/19 1638 09/14/19 2100 09/15/19 0731  GLUCAP 151* 216* 213* 161* 141*    Recent Results (from the past 240 hour(s))  Respiratory Panel by PCR     Status: None   Collection Time: 09/09/19  6:50 PM   Specimen: Nasopharyngeal Swab; Respiratory  Result Value Ref Range Status   Adenovirus NOT DETECTED NOT DETECTED Final   Coronavirus 229E NOT DETECTED NOT DETECTED Final    Comment: (NOTE) The Coronavirus on the Respiratory Panel, DOES NOT test for the novel  Coronavirus (2019 nCoV)    Coronavirus HKU1 NOT DETECTED NOT DETECTED Final   Coronavirus NL63 NOT DETECTED NOT DETECTED Final   Coronavirus OC43 NOT DETECTED NOT DETECTED Final   Metapneumovirus NOT DETECTED NOT DETECTED Final   Rhinovirus / Enterovirus NOT DETECTED NOT DETECTED Final   Influenza A NOT DETECTED NOT DETECTED Final   Influenza B NOT DETECTED NOT DETECTED Final   Parainfluenza Virus 1 NOT DETECTED NOT DETECTED Final   Parainfluenza Virus 2 NOT DETECTED NOT DETECTED Final   Parainfluenza Virus 3 NOT DETECTED NOT DETECTED Final   Parainfluenza Virus 4 NOT DETECTED NOT DETECTED Final   Respiratory Syncytial Virus NOT DETECTED NOT DETECTED Final     Bordetella pertussis NOT DETECTED NOT DETECTED Final   Chlamydophila pneumoniae NOT DETECTED NOT DETECTED Final   Mycoplasma pneumoniae NOT DETECTED NOT DETECTED Final    Comment: Performed at [facility], [address], [place], [place] [address]  Culture, blood (Routine X 2) w Reflex to ID Panel     Status: None   Collection Time: 09/09/19  9:00 PM   Specimen: BLOOD RIGHT HAND  Result Value Ref Range Status   Specimen Description   Final    BLOOD RIGHT HAND Performed at [facility],  [place] [address], [place], [place] [address]    Special Requests   Final    BOTTLES DRAWN AEROBIC ONLY Blood Culture adequate volume Performed at [place] [address], [place], [place] [address]    Culture   Final    NO GROWTH 5 DAYS Performed at [facility], [place] [address] [place], [place] [address]    Report Status 09/15/2019 FINAL  Final  Culture, blood (Routine X 2) w Reflex to ID Panel     Status: None   Collection Time: 09/09/19  9:00 PM   Specimen: BLOOD LEFT HAND  Result Value Ref Range Status   Specimen Description   Final    BLOOD LEFT HAND Performed at [place] [address], [place], [place] [address]    Special Requests   Final    BOTTLES DRAWN AEROBIC ONLY Blood Culture adequate volume Performed at [place] [address] [place], [place] [address]    Culture   Final    NO GROWTH 5 DAYS Performed at [facility], [place] [address], [place], [place] [address]    Report Status 09/15/2019 FINAL  Final  MRSA PCR Screening     Status: None   Collection Time: 09/12/19  4:03 AM   Specimen: Nasal Mucosa; Nasopharyngeal  Result Value Ref Range Status   MRSA by PCR NEGATIVE NEGATIVE Final    Comment:        The GeneXpert MRSA Assay (FDA approved for NASAL specimens only), is one component of a comprehensive MRSA colonization surveillance program. It is not intended to  diagnose MRSA infection nor to guide or monitor treatment for MRSA infections. Performed at [facility], [place] [address], [place], [place] [address]      Scheduled Meds: . vitamin C  500 mg Oral Daily  . aspirin EC  81 mg Oral Daily  . carvedilol  25 mg Oral BID WC  . cetirizine  10 mg Oral Daily  . cloNIDine  0.1 mg Oral BID  . clopidogrel  75 mg Oral Daily  . colchicine  0.6 mg Oral Daily  . dicyclomine  10 mg Oral TID AC  . docusate sodium  100 mg Oral BID  . enoxaparin (LOVENOX) injection  40 mg Subcutaneous Q24H  . folic acid  1 mg Oral Daily  . gabapentin  600 mg Oral TID  . insulin aspart  0-20 Units Subcutaneous TID WC  . insulin aspart  0-5 Units Subcutaneous QHS  . insulin detemir  30 Units Subcutaneous BID  . ipratropium  2 spray Each Nare QID  . isosorbide mononitrate  60 mg Oral Daily  . montelukast  10 mg Oral QHS  . pantoprazole  40 mg Oral Daily  . pravastatin  40 mg Oral Daily  . probenecid  1,000 mg Oral BID  . sodium chloride flush  3 mL Intravenous Q12H  . spironolactone  12.5 mg Oral Daily  . thiamine  100 mg Oral Daily  . zinc sulfate  220 mg Oral Daily     LOS: 6 days   Cherene Altes, MD [facility] Office  [phone] Pager - Text Page per Amion  If 7PM-7AM, please contact night-coverage per Amion 09/15/2019, 9:26 AM

## 2019-09-15 NOTE — Progress Notes (Signed)
Attempted to cal pt's wife [name] for updates. No answer. Son was called and updated on pt's status. Pt is not in any acute distress at this time. Will continue to monitor.

## 2019-09-16 LAB — BASIC METABOLIC PANEL
Anion gap: 13 (ref 5–15)
BUN: 33 mg/dL — ABNORMAL HIGH (ref 8–23)
CO2: 26 mmol/L (ref 22–32)
Calcium: 9.1 mg/dL (ref 8.9–10.3)
Chloride: 92 mmol/L — ABNORMAL LOW (ref 98–111)
Creatinine, Ser: 1.18 mg/dL (ref 0.61–1.24)
GFR calc Af Amer: >60 mL/min (ref >60)
GFR calc non Af Amer: >60 mL/min (ref >60)
Glucose, Bld: 110 mg/dL — ABNORMAL HIGH (ref 70–99)
Potassium: 4.2 mmol/L (ref 3.5–5.1)
Sodium: 131 mmol/L — ABNORMAL LOW (ref 135–145)

## 2019-09-16 LAB — GLUCOSE, CAPILLARY
Glucose-Capillary: 124 mg/dL — ABNORMAL HIGH (ref 70–99)
Glucose-Capillary: 179 mg/dL — ABNORMAL HIGH (ref 70–99)
Glucose-Capillary: 229 mg/dL — ABNORMAL HIGH (ref 70–99)

## 2019-09-16 NOTE — Progress Notes (Signed)
Returned call from son. Updates given.

## 2019-09-16 NOTE — Progress Notes (Signed)
Updates given to [place] (wife) at this time. Wants me to call son to give updates. Son was called to give updates. Updates given.Pt not in any acute distress at this time. Will continue to monitor.

## 2019-09-16 NOTE — Progress Notes (Signed)
Uneventful shift. Weaned patient O2 rate to 3LPM HFNC. O2 sat maintaining in the low 90's at that rate. Patient remained alert and oriented x 3 this shift. No acute cardiac or respiratory episodes this shift. No falls or injuries  this shift. Resting comfortably in chair watching TV. Call bell within reach. Will continue to monitor.Marland Kitchen

## 2019-09-16 NOTE — Progress Notes (Signed)
Son Marlou Sa) updated on current status/condition and discharge plan.

## 2019-09-16 NOTE — Progress Notes (Signed)
Chase Cabrera  AYT:[id number] DOB: December 31, 1945 DOA: 09/09/2019 PCP: McLean-Scocuzza, Nino Glow, MD    Brief Narrative:  74 year old with a history of CHF, CAD, Crohn's disease, DM 2, GERD, HTN, gout, and HLD who presented to [facility] 12/22 with acute onset shortness of breath and generalized weakness which had been present for 4 days.  In the ED he was found to have a temperature of 102 and saturations of 90% on room air.  He was found to be Covid positive and a CXR noted pulmonary vascular congestion.  He was admitted and treatment was initiated with remdesivir and Decadron.  12/27 he was transferred to [facility].  Significant Events: 12/22 admit to [facility] 12/27 transfer to [facility]  COVID-19 specific Treatment: Decadron 12/22 > 12/28 Remdesivir 12/22 > 12/26 Actemra 12/24  Antimicrobials:  Azithromycin 12/22 Rocephin 12/22  Subjective: Requiring 5 L high flow nasal cannula. Awake and conversant. C/o feeling cold in his room. Denies cp, n/v, or abdom pain. Appears to be improving.   Assessment & Plan:  COVID Pneumonia - acute hypoxic respiratory failure Completed courses of remdesivir and decadron - was dosed with Actemra - in no apparent acute distress but continues to require oxygen support beyond what can be provided at home - WBC normal and patient is afebrile - cont to work on mobilization and weaning of oxygen - encouraged use of flutter valve and IS  Hyponatremia Aldactone stopped - follow trend   Hypokalemia Due to diuresis - now stable   Acute kidney injury Creatinine 1.04 October this year - creatinine cont to improve with discontinuation of diuretic  Chronic atrial fibrillation Sinus bradycardia persists - does not appear to be on chronic anticoagulation  Chronic combined systolic and diastolic CHF EF 35-57% via TTE Jan 2020 - typically treated with Lasix 80 mg QOD - baseline weight approximately 86kg per old records - net negative ~4.4L since admit   Filed Weights   09/09/19 1821  Weight: 82.1 kg    DM 2 uncontrolled with peripheral neuropathy and hyperglycemia CBG much improved   HLD Continue usual home medication  BPH Continue usual home medication  DVT prophylaxis: Lovenox Code Status: FULL CODE Family Communication:  Disposition Plan: med/surg - wean oxygen as able -mobilize -await PT/OT re-evaluation but presently suggesting SNF rehab may be required  Consultants:  none  Objective: Blood pressure 110/69, pulse (!) 55, temperature 98 F (36.7 C), temperature source Oral, resp. rate 20, height 5' 8"  (1.727 m), weight 82.1 kg, SpO2 92 %.  Intake/Output Summary (Last 24 hours) at 09/16/2019 0918 Last data filed at 09/16/2019 0800 Gross per 24 hour  Intake 733 ml  Output 1475 ml  Net -742 ml   Filed Weights   09/09/19 1821  Weight: 82.1 kg    Examination: General: no acute distress  Lungs: improved air movement th/o - no wheezing  Cardiovascular: brady - regular - no M  Abdomen: NT/ND, soft, BS positive Extremities: No edema B LE   CBC: Recent Labs  Lab 09/10/19 0423 09/11/19 0220 09/12/19 0030 09/14/19 0916 09/15/19 0053  WBC 10.2 9.6 9.2 8.7 11.2*  NEUTROABS 9.0* 8.4* 6.1  --   --   HGB 13.4 14.0 14.3 16.5 14.8  HCT 39.6 41.2 43.2 49.7 43.7  MCV 99.5 100.7* 101.2* 101.2* 100.2*  PLT 502* 483* 514* 473* 322   Basic Metabolic Panel: Recent Labs  Lab 09/09/19 2100 09/10/19 0423 09/11/19 0220 09/12/19 0030 09/14/19 0916 09/15/19 0053 09/16/19 0420  NA 131* 131*  133* 134* 132* 127* 131*  K 3.8 4.2 4.2 3.5 3.4* 3.1* 4.2  CL 91* 91* 95* 92* 85* 84* 92*  CO2 27 25 26 26 31 26 26   GLUCOSE 190* 303* 267* 193* 160* 151* 110*  BUN 39* 38* 36* 39* 44* 46* 33*  CREATININE 1.61* 1.45* 1.38* 1.26* 1.55* 1.36* 1.18  CALCIUM 8.8* 8.8* 8.9 9.3 9.3 8.8* 9.1  MG 2.0 2.0 2.3 2.2  --  2.2  --   PHOS 2.9 2.8 2.7  --   --   --   --    GFR: Estimated Creatinine Clearance: 56.5 mL/min (by C-G formula based on SCr of 1.18  mg/dL).  Liver Function Tests: Recent Labs  Lab 09/11/19 0220 09/12/19 0030 09/14/19 0916 09/15/19 0053  AST 48* 53* 50* 51*  ALT 41 43 45* 43  ALKPHOS 65 64 74 69  BILITOT 1.0 0.8 0.6 0.9  PROT 7.2 7.9 8.1 7.0  ALBUMIN 3.1* 3.3* 3.3* 3.0*    HbA1C: Hgb A1c MFr Bld  Date/Time Value Ref Range Status  09/11/2019 02:20 AM 7.1 (H) 4.8 - 5.6 % Final    Comment:    (NOTE) Pre diabetes:          5.7%-6.4% Diabetes:              >6.4% Glycemic control for   <7.0% adults with diabetes   04/30/2018 04:18 PM 7.1 (H) 4.8 - 5.6 % Final    Comment:    (NOTE) Pre diabetes:          5.7%-6.4% Diabetes:              >6.4% Glycemic control for   <7.0% adults with diabetes     CBG: Recent Labs  Lab 09/14/19 2100 09/15/19 0731 09/15/19 1621 09/15/19 2047 09/16/19 0805  GLUCAP 161* 141* 188* 211* 124*    Recent Results (from the past 240 hour(s))  Respiratory Panel by PCR     Status: None   Collection Time: 09/09/19  6:50 PM   Specimen: Nasopharyngeal Swab; Respiratory  Result Value Ref Range Status   Adenovirus NOT DETECTED NOT DETECTED Final   Coronavirus 229E NOT DETECTED NOT DETECTED Final    Comment: (NOTE) The Coronavirus on the Respiratory Panel, DOES NOT test for the novel  Coronavirus (2019 nCoV)    Coronavirus HKU1 NOT DETECTED NOT DETECTED Final   Coronavirus NL63 NOT DETECTED NOT DETECTED Final   Coronavirus OC43 NOT DETECTED NOT DETECTED Final   Metapneumovirus NOT DETECTED NOT DETECTED Final   Rhinovirus / Enterovirus NOT DETECTED NOT DETECTED Final   Influenza A NOT DETECTED NOT DETECTED Final   Influenza B NOT DETECTED NOT DETECTED Final   Parainfluenza Virus 1 NOT DETECTED NOT DETECTED Final   Parainfluenza Virus 2 NOT DETECTED NOT DETECTED Final   Parainfluenza Virus 3 NOT DETECTED NOT DETECTED Final   Parainfluenza Virus 4 NOT DETECTED NOT DETECTED Final   Respiratory Syncytial Virus NOT DETECTED NOT DETECTED Final   Bordetella pertussis NOT  DETECTED NOT DETECTED Final   Chlamydophila pneumoniae NOT DETECTED NOT DETECTED Final   Mycoplasma pneumoniae NOT DETECTED NOT DETECTED Final    Comment: Performed at [facility], [address] [place], [place] [address]  Culture, blood (Routine X 2) w Reflex to ID Panel     Status: None   Collection Time: 09/09/19  9:00 PM   Specimen: BLOOD RIGHT HAND  Result Value Ref Range Status   Specimen Description  Final    BLOOD RIGHT HAND Performed at [facility], [place] [address], [place], [place] [address]    Special Requests   Final    BOTTLES DRAWN AEROBIC ONLY Blood Culture adequate volume Performed at [place] [address], [place], [place] [address]    Culture   Final    NO GROWTH 5 DAYS Performed at [facility], [place] [address] [place], [place] [address]    Report Status 09/15/2019 FINAL  Final  Culture, blood (Routine X 2) w Reflex to ID Panel     Status: None   Collection Time: 09/09/19  9:00 PM   Specimen: BLOOD LEFT HAND  Result Value Ref Range Status   Specimen Description   Final    BLOOD LEFT HAND Performed at [place] [address], [place], [place] [address]    Special Requests   Final    BOTTLES DRAWN AEROBIC ONLY Blood Culture adequate volume Performed at [place] [address] [place], [place] [address]    Culture   Final    NO GROWTH 5 DAYS Performed at [facility], [place] [address] [place], [place] [address]    Report Status 09/15/2019 FINAL  Final  MRSA PCR Screening     Status: None   Collection Time: 09/12/19  4:03 AM   Specimen: Nasal Mucosa; Nasopharyngeal  Result Value Ref Range Status   MRSA by PCR NEGATIVE NEGATIVE Final    Comment:        The GeneXpert MRSA Assay (FDA approved for NASAL specimens only), is one component of a comprehensive MRSA colonization surveillance program. It is not intended to diagnose MRSA infection nor  to guide or monitor treatment for MRSA infections. Performed at [facility], [place] [address], [place], [place] [address]      Scheduled Meds: . vitamin C  500 mg Oral Daily  . aspirin EC  81 mg Oral Daily  . carvedilol  25 mg Oral BID WC  . cetirizine  10 mg Oral Daily  . cloNIDine  0.1 mg Oral BID  . clopidogrel  75 mg Oral Daily  . colchicine  0.6 mg Oral Daily  . dicyclomine  10 mg Oral TID AC  . docusate sodium  100 mg Oral BID  . enoxaparin (LOVENOX) injection  40 mg Subcutaneous Q24H  . folic acid  1 mg Oral Daily  . gabapentin  600 mg Oral TID  . insulin aspart  0-20 Units Subcutaneous TID WC  . insulin aspart  0-5 Units Subcutaneous QHS  . insulin detemir  30 Units Subcutaneous BID  . ipratropium  2 spray Each Nare QID  . isosorbide mononitrate  60 mg Oral Daily  . montelukast  10 mg Oral QHS  . pantoprazole  40 mg Oral Daily  . potassium chloride  40 mEq Oral TID  . pravastatin  40 mg Oral Daily  . probenecid  1,000 mg Oral BID  . sodium chloride flush  3 mL Intravenous Q12H  . thiamine  100 mg Oral Daily  . zinc sulfate  220 mg Oral Daily     LOS: 7 days   Cherene Altes, MD [facility] Office  [phone] Pager - Text Page per Amion  If 7PM-7AM, please contact night-coverage per Amion 09/16/2019, 9:18 AM

## 2019-09-16 NOTE — Plan of Care (Signed)
  Problem: Education: Goal: Knowledge of risk factors and measures for prevention of condition will improve Outcome: Progressing   Problem: Coping: Goal: Psychosocial and spiritual needs will be supported Outcome: Progressing   Problem: Respiratory: Goal: Will maintain a patent airway Outcome: Progressing Goal: Complications related to the disease process, condition or treatment will be avoided or minimized Outcome: Progressing   

## 2019-09-17 LAB — GLUCOSE, CAPILLARY
Glucose-Capillary: 201 mg/dL — ABNORMAL HIGH (ref 70–99)
Glucose-Capillary: 168 mg/dL — ABNORMAL HIGH (ref 70–99)
Glucose-Capillary: 100 mg/dL — ABNORMAL HIGH (ref 70–99)
Glucose-Capillary: 186 mg/dL — ABNORMAL HIGH (ref 70–99)
Glucose-Capillary: 192 mg/dL — ABNORMAL HIGH (ref 70–99)
Glucose-Capillary: 171 mg/dL — ABNORMAL HIGH (ref 70–99)

## 2019-09-17 NOTE — Progress Notes (Addendum)
Physical Therapy Treatment Patient Details Name: Chase Cabrera MRN: [id number] DOB: March 10, 1946 Today's Date: 09/17/2019    History of Present Illness 74 year old with a history of CHF, CAD, Crohn's disease, DM 2, GERD, HTN, gout, and HLD who presented to [facility] 12/22 and was admitted  with acute onset shortness of breath and generalized weakness , temperature of 102 and saturations of 90% on room air.  He was found to be Covid positive and a CXR noted pulmonary vascular congestion.  12/27 he was transferred to [facility].    PT Comments    The patient is alert and able to progress ambulation in room. Patient  Has finger probe in place with SPO2 92% on 3 L at rest. ambulated x 10' on RA, SPO2 87%, remained 88% back on 3 liters for ambulation. Patient is more alert and oriented. Patient reports that he has  Present, uses a Rw or scooter PTA.Attempted ear probe but did not register.  could benefit from another walk test and speak to family about DC plans.  Follow Up Recommendations  SNF vs Home health PT;Supervision/Assistance - 24 hour     Equipment Recommendations  3in1 (PT)    Recommendations for Other Services       Precautions / Restrictions Precautions Precautions: Fall Precaution Comments: SpO2 slow to respond to O2 adjustments    Mobility  Bed Mobility Overal bed mobility: Needs Assistance Bed Mobility: Supine to Sit     Supine to sit: Min guard     General bed mobility comments: extra time to initiate  Transfers Overall transfer level: Needs assistance Equipment used: Rolling walker (2 wheeled) Transfers: Sit to/from Stand Sit to Stand: Min assist         General transfer comment: steady assist from bed and recliner.  Ambulation/Gait Ambulation/Gait assistance: Min assist Gait Distance (Feet): 3 Feet(then 20) Assistive device: Rolling walker (2 wheeled) Gait Pattern/deviations: Step-to pattern Gait velocity: decreased   General Gait Details: initially  ambulated to rcliner, rsted then ambulated x 20' with Rw, gait steady, turns without balance loss.*   Stairs             Wheelchair Mobility    Modified Rankin (Stroke Patients Only)       Balance Overall balance assessment: Needs assistance Sitting-balance support: Feet supported;Bilateral upper extremity supported Sitting balance-Leahy Scale: Fair     Standing balance support: During functional activity;Bilateral upper extremity supported Standing balance-Leahy Scale: Fair Standing balance comment: pt requiring UE support for static standing balance                            Cognition Arousal/Alertness: Awake/alert Behavior During Therapy: WFL for tasks assessed/performed Overall Cognitive Status: No family/caregiver present to determine baseline cognitive functioning Area of Impairment: Memory;Orientation;Safety/judgement                     Memory: Decreased recall of precautions   Safety/Judgement: Decreased awareness of safety;Decreased awareness of deficits     General Comments: patioent oriented and able to provide  information today      Exercises      General Comments        Pertinent Vitals/Pain Pain Assessment: No/denies pain    Home Living                      Prior Function            PT Goals (current  goals can now be found in the care plan section) Acute Rehab PT Goals Time For Goal Achievement: 09/26/19 Progress towards PT goals: Progressing toward goals    Frequency    Min 3X/week      PT Plan Current plan remains appropriate    Co-evaluation              AM-PAC PT "6 Clicks" Mobility   Outcome Measure  Help needed turning from your back to your side while in a flat bed without using bedrails?: A Little Help needed moving from lying on your back to sitting on the side of a flat bed without using bedrails?: A Little Help needed moving to and from a bed to a chair (including a  wheelchair)?: A Little Help needed standing up from a chair using your arms (e.g., wheelchair or bedside chair)?: A Little Help needed to walk in hospital room?: A Little Help needed climbing 3-5 steps with a railing? : A Lot 6 Click Score: 17    End of Session Equipment Utilized During Treatment: Oxygen;Gait belt Activity Tolerance: Patient tolerated treatment well Patient left: in chair;with call bell/phone within reach;with chair alarm set Nurse Communication: Mobility status;Precautions;Other (comment) PT Visit Diagnosis: Other abnormalities of gait and mobility (R26.89);Muscle weakness (generalized) (M62.81);Difficulty in walking, not elsewhere classified (R26.2)     Time: 0223-3612 PT Time Calculation (min) (ACUTE ONLY): 33 min  Charges:  $Gait Training: 23-37 mins                     [place] Pager [phone] Office [phone]    Claretha Cooper 09/17/2019, 2:52 PM

## 2019-09-17 NOTE — Progress Notes (Signed)
Chase Cabrera  OBS:[id number] DOB: 06/16/1946 DOA: 09/09/2019 PCP: McLean-Scocuzza, Nino Glow, MD    Brief Narrative:  74 year old with a history of CHF, CAD, Crohn's disease, DM 2, GERD, HTN, gout, and HLD who presented to [facility] 12/22 with acute onset shortness of breath and generalized weakness which had been present for 4 days.  In the ED he was found to have a temperature of 102 and saturations of 90% on room air.  He was found to be Covid positive and a CXR noted pulmonary vascular congestion.  He was admitted and treatment was initiated with remdesivir and Decadron.  12/27 he was transferred to [facility].  Significant Events: 12/22 admit to [facility] 12/27 transfer to [facility]  COVID-19 specific Treatment: Decadron 12/22 > 12/28 Remdesivir 12/22 > 12/26 Actemra 12/24  Antimicrobials:  Azithromycin 12/22 Rocephin 12/22  Subjective: Patient has been successfully weaned to 3 L nasal cannula with saturations 87-94%.  Vitals are otherwise stable.  He was able to ambulate 10 feet with physical therapy on room air with sats only dropping to 87%.  Interestingly though he required 3 L to keep his sats at 88% during ambulation.  Assessment & Plan:  COVID Pneumonia - acute hypoxic respiratory failure Completed courses of remdesivir and decadron - was dosed with Actemra - in no apparent acute distress but continues to require oxygen support - WBC normal and patient is afebrile - cont to work on mobilization and weaning of oxygen - encouraged use of flutter valve and IS -anticipate home with oxygen support versus SNF in next 24-48 hours  Hyponatremia Aldactone stopped -recheck sodium in a.m.  Hypokalemia Due to diuresis -resolved -follow-up potassium in a.m.  Acute kidney injury Creatinine 1.04 October this year - creatinine cont to improve with discontinuation of diuretic -recheck in a.m.  Chronic atrial fibrillation Sinus bradycardia persists - does not appear to be on chronic  anticoagulation  Chronic combined systolic and diastolic CHF EF 47-65% via TTE Jan 2020 - typically treated with Lasix 80 mg QOD - baseline weight approximately 86kg per old records - net negative ~3.4L since admit    DM 2 uncontrolled with peripheral neuropathy and hyperglycemia CBG much improved   HLD Continue usual home medication  BPH Continue usual home medication  DVT prophylaxis: Lovenox Code Status: FULL CODE Family Communication:  Disposition Plan: med/surg - wean oxygen as able -mobilize - SNF v/s Home   Consultants:  none  Objective: Blood pressure (!) 119/54, pulse (!) 45, temperature 97.9 F (36.6 C), temperature source Axillary, resp. rate (!) 22, height 5' 8"  (1.727 m), weight 82.1 kg, SpO2 93 %.  Intake/Output Summary (Last 24 hours) at 09/17/2019 0938 Last data filed at 09/16/2019 1927 Gross per 24 hour  Intake 960 ml  Output 0 ml  Net 960 ml   Filed Weights   09/09/19 1821  Weight: 82.1 kg    Examination: General: no acute distress  Lungs: No focal crackles or wheezing Cardiovascular: brady - regular - no M  Abdomen: NT/ND, soft, BS positive Extremities: No edema B lower extremities  CBC: Recent Labs  Lab 09/11/19 0220 09/12/19 0030 09/14/19 0916 09/15/19 0053  WBC 9.6 9.2 8.7 11.2*  NEUTROABS 8.4* 6.1  --   --   HGB 14.0 14.3 16.5 14.8  HCT 41.2 43.2 49.7 43.7  MCV 100.7* 101.2* 101.2* 100.2*  PLT 483* 514* 473* 465   Basic Metabolic Panel: Recent Labs  Lab 09/11/19 0220 09/12/19 0030 09/14/19 0354 09/15/19 0053 09/16/19 0420  NA 133* 134* 132* 127* 131*  K 4.2 3.5 3.4* 3.1* 4.2  CL 95* 92* 85* 84* 92*  CO2 26 26 31 26 26   GLUCOSE 267* 193* 160* 151* 110*  BUN 36* 39* 44* 46* 33*  CREATININE 1.38* 1.26* 1.55* 1.36* 1.18  CALCIUM 8.9 9.3 9.3 8.8* 9.1  MG 2.3 2.2  --  2.2  --   PHOS 2.7  --   --   --   --    GFR: Estimated Creatinine Clearance: 56.5 mL/min (by C-G formula based on SCr of 1.18 mg/dL).  Liver Function  Tests: Recent Labs  Lab 09/11/19 0220 09/12/19 0030 09/14/19 0916 09/15/19 0053  AST 48* 53* 50* 51*  ALT 41 43 45* 43  ALKPHOS 65 64 74 69  BILITOT 1.0 0.8 0.6 0.9  PROT 7.2 7.9 8.1 7.0  ALBUMIN 3.1* 3.3* 3.3* 3.0*    HbA1C: Hgb A1c MFr Bld  Date/Time Value Ref Range Status  09/11/2019 02:20 AM 7.1 (H) 4.8 - 5.6 % Final    Comment:    (NOTE) Pre diabetes:          5.7%-6.4% Diabetes:              >6.4% Glycemic control for   <7.0% adults with diabetes   04/30/2018 04:18 PM 7.1 (H) 4.8 - 5.6 % Final    Comment:    (NOTE) Pre diabetes:          5.7%-6.4% Diabetes:              >6.4% Glycemic control for   <7.0% adults with diabetes     CBG: Recent Labs  Lab 09/15/19 2047 09/16/19 0805 09/16/19 1145 09/16/19 2056 09/17/19 0717  GLUCAP 211* 124* 179* 229* 100*    Recent Results (from the past 240 hour(s))  Respiratory Panel by PCR     Status: None   Collection Time: 09/09/19  6:50 PM   Specimen: Nasopharyngeal Swab; Respiratory  Result Value Ref Range Status   Adenovirus NOT DETECTED NOT DETECTED Final   Coronavirus 229E NOT DETECTED NOT DETECTED Final    Comment: (NOTE) The Coronavirus on the Respiratory Panel, DOES NOT test for the novel  Coronavirus (2019 nCoV)    Coronavirus HKU1 NOT DETECTED NOT DETECTED Final   Coronavirus NL63 NOT DETECTED NOT DETECTED Final   Coronavirus OC43 NOT DETECTED NOT DETECTED Final   Metapneumovirus NOT DETECTED NOT DETECTED Final   Rhinovirus / Enterovirus NOT DETECTED NOT DETECTED Final   Influenza A NOT DETECTED NOT DETECTED Final   Influenza B NOT DETECTED NOT DETECTED Final   Parainfluenza Virus 1 NOT DETECTED NOT DETECTED Final   Parainfluenza Virus 2 NOT DETECTED NOT DETECTED Final   Parainfluenza Virus 3 NOT DETECTED NOT DETECTED Final   Parainfluenza Virus 4 NOT DETECTED NOT DETECTED Final   Respiratory Syncytial Virus NOT DETECTED NOT DETECTED Final   Bordetella pertussis NOT DETECTED NOT DETECTED Final    Chlamydophila pneumoniae NOT DETECTED NOT DETECTED Final   Mycoplasma pneumoniae NOT DETECTED NOT DETECTED Final    Comment: Performed at [facility], [address], [place], [place] [address]  Culture, blood (Routine X 2) w Reflex to ID Panel     Status: None   Collection Time: 09/09/19  9:00 PM   Specimen: BLOOD RIGHT HAND  Result Value Ref Range Status   Specimen Description   Final    BLOOD RIGHT HAND Performed at [place] [address]  [place], [place] [address]    Special Requests   Final    BOTTLES DRAWN AEROBIC ONLY Blood Culture adequate volume Performed at [place] [address] [place], [place] [address]    Culture   Final    NO GROWTH 5 DAYS Performed at [facility], [place] [address], [place], [place] [address]    Report Status 09/15/2019 FINAL  Final  Culture, blood (Routine X 2) w Reflex to ID Panel     Status: None   Collection Time: 09/09/19  9:00 PM   Specimen: BLOOD LEFT HAND  Result Value Ref Range Status   Specimen Description   Final    BLOOD LEFT HAND Performed at [place] [address], [place], [place] [address]    Special Requests   Final    BOTTLES DRAWN AEROBIC ONLY Blood Culture adequate volume Performed at [place] [address] [place], [place] [address]    Culture   Final    NO GROWTH 5 DAYS Performed at [facility], [place] [address] [place], [place] [address]    Report Status 09/15/2019 FINAL  Final  MRSA PCR Screening     Status: None   Collection Time: 09/12/19  4:03 AM   Specimen: Nasal Mucosa; Nasopharyngeal  Result Value Ref Range Status   MRSA by PCR NEGATIVE NEGATIVE Final    Comment:        The GeneXpert MRSA Assay (FDA approved for NASAL specimens only), is one component of a comprehensive MRSA colonization surveillance program. It is not intended to diagnose MRSA infection nor to guide or monitor treatment  for MRSA infections. Performed at [facility], [place] [address], [place], [place] [address]      Scheduled Meds: . vitamin C  500 mg Oral Daily  . aspirin EC  81 mg Oral Daily  . carvedilol  25 mg Oral BID WC  . cetirizine  10 mg Oral Daily  . cloNIDine  0.1 mg Oral BID  . clopidogrel  75 mg Oral Daily  . colchicine  0.6 mg Oral Daily  . dicyclomine  10 mg Oral TID AC  . docusate sodium  100 mg Oral BID  . enoxaparin (LOVENOX) injection  40 mg Subcutaneous Q24H  . folic acid  1 mg Oral Daily  . gabapentin  600 mg Oral TID  . insulin aspart  0-20 Units Subcutaneous TID WC  . insulin aspart  0-5 Units Subcutaneous QHS  . insulin detemir  30 Units Subcutaneous BID  . ipratropium  2 spray Each Nare QID  . isosorbide mononitrate  60 mg Oral Daily  . montelukast  10 mg Oral QHS  . pantoprazole  40 mg Oral Daily  . pravastatin  40 mg Oral Daily  . probenecid  1,000 mg Oral BID  . sodium chloride flush  3 mL Intravenous Q12H  . thiamine  100 mg Oral Daily  . zinc sulfate  220 mg Oral Daily     LOS: 8 days   Cherene Altes, MD [facility] Office  [phone] Pager - Text Page per Amion  If 7PM-7AM, please contact night-coverage per Amion 09/17/2019, 9:38 AM

## 2019-09-17 NOTE — Progress Notes (Signed)
Chase Cabrera (son) updated on current status and discharge plan.

## 2019-09-18 LAB — GLUCOSE, CAPILLARY
Glucose-Capillary: 89 mg/dL (ref 70–99)
Glucose-Capillary: 97 mg/dL (ref 70–99)
Glucose-Capillary: 187 mg/dL — ABNORMAL HIGH (ref 70–99)
Glucose-Capillary: 220 mg/dL — ABNORMAL HIGH (ref 70–99)
Glucose-Capillary: 170 mg/dL — ABNORMAL HIGH (ref 70–99)
Glucose-Capillary: 290 mg/dL — ABNORMAL HIGH (ref 70–99)

## 2019-09-18 LAB — COMPREHENSIVE METABOLIC PANEL
ALT: 28 U/L (ref 0–44)
AST: 30 U/L (ref 15–41)
Albumin: 3 g/dL — ABNORMAL LOW (ref 3.5–5.0)
Alkaline Phosphatase: 53 U/L (ref 38–126)
Anion gap: 10 (ref 5–15)
BUN: 23 mg/dL (ref 8–23)
CO2: 25 mmol/L (ref 22–32)
Calcium: 8.9 mg/dL (ref 8.9–10.3)
Chloride: 97 mmol/L — ABNORMAL LOW (ref 98–111)
Creatinine, Ser: 1.13 mg/dL (ref 0.61–1.24)
GFR calc Af Amer: >60 mL/min (ref >60)
GFR calc non Af Amer: >60 mL/min (ref >60)
Glucose, Bld: 85 mg/dL (ref 70–99)
Potassium: 3.9 mmol/L (ref 3.5–5.1)
Sodium: 132 mmol/L — ABNORMAL LOW (ref 135–145)
Total Bilirubin: 0.4 mg/dL (ref 0.3–1.2)
Total Protein: 6.5 g/dL (ref 6.5–8.1)

## 2019-09-18 MED ORDER — ISOSORBIDE MONONITRATE ER 30 MG PO TB24
30.0000 mg | ORAL_TABLET | Freq: Every day | ORAL | Status: DC
  Administered 2019-09-18 – 2019-09-25 (×8): 30 mg via ORAL
  Filled 2019-09-18 (×9): qty 1

## 2019-09-18 MED ORDER — CARVEDILOL 3.125 MG PO TABS
3.1250 mg | ORAL_TABLET | Freq: Two times a day (BID) | ORAL | Status: DC
  Administered 2019-09-18 – 2019-09-24 (×9): 3.125 mg via ORAL
  Filled 2019-09-18 (×14): qty 1

## 2019-09-18 NOTE — Progress Notes (Signed)
Chase Cabrera  OVF:[id number] DOB: August 16, 1946 DOA: 09/09/2019 PCP: McLean-Scocuzza, Nino Glow, MD    Brief Narrative:  74 year old with a history of CHF, CAD, Crohn's disease, DM 2, GERD, HTN, gout, and HLD who presented to [facility] 12/22 with acute onset shortness of breath and generalized weakness which had been present for 4 days.  In the ED he was found to have a temperature of 102 and saturations of 90% on room air.  He was found to be Covid positive and a CXR noted pulmonary vascular congestion.  He was admitted and treatment was initiated with remdesivir and Decadron.  12/27 he was transferred to [facility].  Significant Events: 12/22 admit to [facility] 12/27 transfer to [facility]  COVID-19 specific Treatment: Decadron 12/22 > 12/28 Remdesivir 12/22 > 12/26 Actemra 12/24  Antimicrobials:  Azithromycin 12/22 Rocephin 12/22  Subjective: Yesterday he was able to ambulate 10 feet with physical therapy on room air with sats only dropping to 87%.  Interestingly though he required 3 L to keep his sats at 88% during ambulation.  He is quite pleasant and interactive today though mildly confused.  He denies any complaints at all.  Per physical therapy he appears to be trending a fine line between SNF for rehab placement versus home health with PT.  Will await their reassessment to plan for a disposition.  Assessment & Plan:  COVID Pneumonia - acute hypoxic respiratory failure Completed courses of remdesivir and decadron - was dosed with Actemra - in no apparent acute distress but continues to require oxygen support - WBC normal and patient is afebrile - cont to work on mobilization and weaning of oxygen - encouraged use of flutter valve and IS - anticipate home with oxygen support versus SNF in next 24-48 hours  Hyponatremia Aldactone stopped -sodium improving  Hypokalemia Due to diuresis -resolved  Acute kidney injury Creatinine 1.04 October this year - creatinine stable following  discontinuation of diuretic  Chronic atrial fibrillation Sinus bradycardia persists - does not appear to be on chronic anticoagulation -dropping dose of beta-blocker today  Chronic combined systolic and diastolic CHF EF 84-16% via TTE Jan 2020 - typically treated with Lasix 80 mg QOD - baseline weight approximately 86kg per old records - net negative ~3.4L since admit   DM 2 uncontrolled with peripheral neuropathy and hyperglycemia CBG much improved   HLD Continue usual home medication  BPH Continue usual home medication  DVT prophylaxis: Lovenox Code Status: FULL CODE Family Communication:  Disposition Plan: med/surg - wean oxygen as able -mobilize - SNF v/s Home to be decided in next 24-48hrs  Consultants:  none  Objective: Blood pressure (!) 135/58, pulse (!) 41, temperature 97.9 F (36.6 C), temperature source Axillary, resp. rate 18, height 5' 8"  (1.727 m), weight 82.1 kg, SpO2 93 %.  Intake/Output Summary (Last 24 hours) at 09/18/2019 0945 Last data filed at 09/18/2019 0736 Gross per 24 hour  Intake 480 ml  Output 900 ml  Net -420 ml   Filed Weights   09/09/19 1821  Weight: 82.1 kg    Examination: General: no acute distress  Lungs: Clear to auscultation bilaterally Cardiovascular: brady - regular - no M  Abdomen: NT/ND, soft, BS positive Extremities: No edema B lower extremities  CBC: Recent Labs  Lab 09/12/19 0030 09/14/19 0916 09/15/19 0053  WBC 9.2 8.7 11.2*  NEUTROABS 6.1  --   --   HGB 14.3 16.5 14.8  HCT 43.2 49.7 43.7  MCV 101.2* 101.2* 100.2*  PLT 514*  473* 768   Basic Metabolic Panel: Recent Labs  Lab 09/12/19 0030 09/15/19 0053 09/16/19 0420 09/18/19 0526  NA 134* 127* 131* 132*  K 3.5 3.1* 4.2 3.9  CL 92* 84* 92* 97*  CO2 26 26 26 25   GLUCOSE 193* 151* 110* 85  BUN 39* 46* 33* 23  CREATININE 1.26* 1.36* 1.18 1.13  CALCIUM 9.3 8.8* 9.1 8.9  MG 2.2 2.2  --   --    GFR: Estimated Creatinine Clearance: 59 mL/min (by C-G formula  based on SCr of 1.13 mg/dL).  Liver Function Tests: Recent Labs  Lab 09/12/19 0030 09/14/19 0916 09/15/19 0053 09/18/19 0526  AST 53* 50* 51* 30  ALT 43 45* 43 28  ALKPHOS 64 74 69 53  BILITOT 0.8 0.6 0.9 0.4  PROT 7.9 8.1 7.0 6.5  ALBUMIN 3.3* 3.3* 3.0* 3.0*    HbA1C: Hgb A1c MFr Bld  Date/Time Value Ref Range Status  09/11/2019 02:20 AM 7.1 (H) 4.8 - 5.6 % Final    Comment:    (NOTE) Pre diabetes:          5.7%-6.4% Diabetes:              >6.4% Glycemic control for   <7.0% adults with diabetes   04/30/2018 04:18 PM 7.1 (H) 4.8 - 5.6 % Final    Comment:    (NOTE) Pre diabetes:          5.7%-6.4% Diabetes:              >6.4% Glycemic control for   <7.0% adults with diabetes     CBG: Recent Labs  Lab 09/17/19 1133 09/17/19 1610 09/17/19 2003 09/18/19 0714 09/18/19 0755  GLUCAP 186* 192* 171* 89 97    Recent Results (from the past 240 hour(s))  Respiratory Panel by PCR     Status: None   Collection Time: 09/09/19  6:50 PM   Specimen: Nasopharyngeal Swab; Respiratory  Result Value Ref Range Status   Adenovirus NOT DETECTED NOT DETECTED Final   Coronavirus 229E NOT DETECTED NOT DETECTED Final    Comment: (NOTE) The Coronavirus on the Respiratory Panel, DOES NOT test for the novel  Coronavirus (2019 nCoV)    Coronavirus HKU1 NOT DETECTED NOT DETECTED Final   Coronavirus NL63 NOT DETECTED NOT DETECTED Final   Coronavirus OC43 NOT DETECTED NOT DETECTED Final   Metapneumovirus NOT DETECTED NOT DETECTED Final   Rhinovirus / Enterovirus NOT DETECTED NOT DETECTED Final   Influenza A NOT DETECTED NOT DETECTED Final   Influenza B NOT DETECTED NOT DETECTED Final   Parainfluenza Virus 1 NOT DETECTED NOT DETECTED Final   Parainfluenza Virus 2 NOT DETECTED NOT DETECTED Final   Parainfluenza Virus 3 NOT DETECTED NOT DETECTED Final   Parainfluenza Virus 4 NOT DETECTED NOT DETECTED Final   Respiratory Syncytial Virus NOT DETECTED NOT DETECTED Final   Bordetella  pertussis NOT DETECTED NOT DETECTED Final   Chlamydophila pneumoniae NOT DETECTED NOT DETECTED Final   Mycoplasma pneumoniae NOT DETECTED NOT DETECTED Final    Comment: Performed at [facility], [address] [place], [place] [address]  Culture, blood (Routine X 2) w Reflex to ID Panel     Status: None   Collection Time: 09/09/19  9:00 PM   Specimen: BLOOD RIGHT HAND  Result Value Ref Range Status   Specimen Description   Final    BLOOD RIGHT HAND Performed at [facility] [address] [place], [place] [address]  Special Requests   Final    BOTTLES DRAWN AEROBIC ONLY Blood Culture adequate volume Performed at [facility] [address] [place], [place] [address]    Culture   Final    NO GROWTH 5 DAYS Performed at [facility], [place] [address], [place], [place] [address]    Report Status 09/15/2019 FINAL  Final  Culture, blood (Routine X 2) w Reflex to ID Panel     Status: None   Collection Time: 09/09/19  9:00 PM   Specimen: BLOOD LEFT HAND  Result Value Ref Range Status   Specimen Description   Final    BLOOD LEFT HAND Performed at [place] [address] [place], [place] [address]    Special Requests   Final    BOTTLES DRAWN AEROBIC ONLY Blood Culture adequate volume Performed at [facility] [address], [place], [place] [address]    Culture   Final    NO GROWTH 5 DAYS Performed at [facility], [place] [address] [place], [place] [address]    Report Status 09/15/2019 FINAL  Final  MRSA PCR Screening     Status: None   Collection Time: 09/12/19  4:03 AM   Specimen: Nasal Mucosa; Nasopharyngeal  Result Value Ref Range Status   MRSA by PCR NEGATIVE NEGATIVE Final    Comment:        The GeneXpert MRSA Assay (FDA approved for NASAL specimens only), is one component of a comprehensive MRSA colonization surveillance program. It is not intended to diagnose  MRSA infection nor to guide or monitor treatment for MRSA infections. Performed at [facility], [place] [address], [place],  [address]      Scheduled Meds: . vitamin C  500 mg Oral Daily  . aspirin EC  81 mg Oral Daily  . carvedilol  3.125 mg Oral BID WC  . cetirizine  10 mg Oral Daily  . cloNIDine  0.1 mg Oral BID  . clopidogrel  75 mg Oral Daily  . colchicine  0.6 mg Oral Daily  . dicyclomine  10 mg Oral TID AC  . docusate sodium  100 mg Oral BID  . enoxaparin (LOVENOX) injection  40 mg Subcutaneous Q24H  . folic acid  1 mg Oral Daily  . gabapentin  600 mg Oral TID  . insulin aspart  0-20 Units Subcutaneous TID WC  . insulin aspart  0-5 Units Subcutaneous QHS  . insulin detemir  30 Units Subcutaneous BID  . ipratropium  2 spray Each Nare QID  . isosorbide mononitrate  30 mg Oral Daily  . montelukast  10 mg Oral QHS  . pantoprazole  40 mg Oral Daily  . pravastatin  40 mg Oral Daily  . probenecid  1,000 mg Oral BID  . sodium chloride flush  3 mL Intravenous Q12H  . thiamine  100 mg Oral Daily  . zinc sulfate  220 mg Oral Daily     LOS: 9 days   Cherene Altes, MD [facility] Office  [phone] Pager - Text Page per Amion  If 7PM-7AM, please contact night-coverage per Amion 09/18/2019, 9:45 AM

## 2019-09-18 NOTE — Consult Note (Signed)
   [facility] CM Inpatient Consult   09/18/2019  KHIEM GARGIS Jan 04, 1946 [id number]   Patient screened for high risk score for unplanned readmission score and for llong length of stay hospitalizations patient was [facility]  Review of patient's medical record reveals patient is in the [facility] with [facility]. Admitted with Pneumonia due to COVID-19 positive.  Chart review from PT notes is recommending a skilled nursing facility transition or for 24 hour care.  Primary Care Provider is  McLean-Scocuzza, Nino Glow,  MD, [facility], a [facility] facility with [facility] Pharmacist.  Pharmacy is: [facility], [place]:  Follow up for disposition needs and if transitions to a [facility] affiliated skilled nursing facility, will follow with transition.  Continue to follow progress and disposition to assess for post hospital care management needs.    Please place a [facility] Care Management consult as appropriate and for questions contact:   Natividad Brood, RN BSN [facility] Liaison  [phone] business mobile phone Toll free office [phone]  Fax number: [phone] [email or web address]

## 2019-09-18 NOTE — Progress Notes (Signed)
1100: Updated Son Marlou Sa on patient's status. All questions answered at this time. Asked to call grandson as well.  1104: Updated Grandson Richie on patient's status. All questions answered at this time.

## 2019-09-19 DIAGNOSIS — U071 COVID-19: Principal | ICD-10-CM

## 2019-09-19 DIAGNOSIS — J1282 Pneumonia due to coronavirus disease 2019: Secondary | ICD-10-CM

## 2019-09-19 DIAGNOSIS — I4891 Unspecified atrial fibrillation: Secondary | ICD-10-CM

## 2019-09-19 DIAGNOSIS — E785 Hyperlipidemia, unspecified: Secondary | ICD-10-CM

## 2019-09-19 DIAGNOSIS — N400 Enlarged prostate without lower urinary tract symptoms: Secondary | ICD-10-CM

## 2019-09-19 DIAGNOSIS — I10 Essential (primary) hypertension: Secondary | ICD-10-CM

## 2019-09-19 DIAGNOSIS — E1142 Type 2 diabetes mellitus with diabetic polyneuropathy: Secondary | ICD-10-CM

## 2019-09-19 LAB — GLUCOSE, CAPILLARY
Glucose-Capillary: 148 mg/dL — ABNORMAL HIGH (ref 70–99)
Glucose-Capillary: 235 mg/dL — ABNORMAL HIGH (ref 70–99)
Glucose-Capillary: 219 mg/dL — ABNORMAL HIGH (ref 70–99)
Glucose-Capillary: 239 mg/dL — ABNORMAL HIGH (ref 70–99)

## 2019-09-19 NOTE — Progress Notes (Signed)
PROGRESS NOTE  Chase Cabrera  NFA:[id number] DOB: 10/05/1945 DOA: 09/09/2019 PCP: McLean-Scocuzza, Nino Glow, MD   Brief Narrative: 74 year old with a history of CHF, CAD, Crohn's disease, DM 2, GERD, HTN, gout, and HLD who presented to [facility] 12/22 with acute onset shortness of breath and generalized weakness which had been present for 4 days.  In the ED he was found to have a temperature of 102 and saturations of 90% on room air.  He was found to be Covid positive and a CXR noted pulmonary vascular congestion.  He was admitted and treatment was initiated with remdesivir and Decadron.  12/27 he was transferred to [facility].  Significant Events: 12/22 admit to [facility] 12/27 transfer to [facility]  COVID-19 Specific Treatment: Decadron 12/22 > 12/28 Remdesivir 12/22 > 12/26 Actemra 12/24  Antimicrobials:  Azithromycin 12/22 Rocephin 12/22  Assessment & Plan: Principal Problem:   Acute respiratory failure with hypoxia (HCC) Active Problems:   Atrial fibrillation (HCC)   DM type 2 with diabetic peripheral neuropathy (HCC)   Hyperlipidemia   Essential hypertension   BPH (benign prostatic hyperplasia)   Pneumonia due to COVID-19 virus   Pressure injury of skin  Acute hypoxic respiratory failure due to covid-19 pneumonia: Completed courses of remdesivir and decadron - was dosed with Actemra.  - Remains hypoxic to 3L O2 and with decreased functional mobility. - Continue FV, IS, frequent mobilization. No indication of alternative cause for refractory hypoxia  Hyponatremia:  - Hold aldactone  Hypokalemia: Due to diuresis, resolved  Acute kidney injury: Creatinine 1.04 October this year - creatinine stable following discontinuation of diuretic. - Monitor intermittently.  Chronic atrial fibrillation with bradycardic response:  - Continue beta blocker at reduced dose.  - Not on anticoagulation PTA.   Chronic combined systolic and diastolic CHF: EF 46-96% via TTE Jan 2020 -  typically treated with Lasix 80 mg QOD - baseline weight approximately 86kg per old records - net negative ~3.4L since admit  - Continue QOD lasix.  T2DM uncontrolled with peripheral neuropathy and hyperglycemia: CBG much improved  - Continue SSI  HLD:  - Continue usual home medication  BPH - Continue usual home medication  DVT prophylaxis: Lovenox Code Status: Full Family Communication: None at bedside. Will call when able to determine safest disposition. Disposition Plan: SNF vs. home. Will ask for frequent PT/OT while still admitted.   Consultants:   None  Procedures:   None  Antimicrobials:  Remdesivir   Subjective: Grumpy about bad food here and not ever getting out of bed (though this is clearly not the case based on progress notes). Would be amenable to going to rehabilitation if recommended. Denies chest pain but gets severely, constantly short of breath with any exertion.   Objective: Vitals:   09/18/19 1946 09/19/19 0423 09/19/19 0921 09/19/19 1633  BP:  (!) 134/54 (!) 120/43 103/88  Pulse: (!) 49 (!) 45 (!) 47 (!) 47  Resp:  18    Temp:  97.9 F (36.6 C) 97.8 F (36.6 C)   TempSrc:  Oral Oral   SpO2: 96% 93% 94% 95%  Weight:      Height:        Intake/Output Summary (Last 24 hours) at 09/19/2019 1818 Last data filed at 09/19/2019 0900 Gross per 24 hour  Intake 483 ml  Output 625 ml  Net -142 ml   Filed Weights   09/09/19 1821  Weight: 82.1 kg    Gen: 74 y.o. male in no distress  Pulm: Non-labored  breathing 3L O2, tachypneic at rest. Clear to auscultation bilaterally.  CV: Regular rate and rhythm. No murmur, rub, or gallop. No JVD, no pedal edema. GI: Abdomen soft, non-tender, non-distended, with normoactive bowel sounds. No organomegaly or masses felt. Ext: Warm, no deformities Skin: No rashes, lesions or ulcers Neuro: Alert and oriented. No focal neurological deficits. Psych: Judgement and insight appear normal. Mood & affect appropriate.    Data Reviewed: I have personally reviewed following labs and imaging studies  CBC: Recent Labs  Lab 09/14/19 0916 09/15/19 0053  WBC 8.7 11.2*  HGB 16.5 14.8  HCT 49.7 43.7  MCV 101.2* 100.2*  PLT 473* 335   Basic Metabolic Panel: Recent Labs  Lab 09/13/19 0111 09/14/19 0916 09/15/19 0053 09/16/19 0420 09/18/19 0526  NA 131* 132* 127* 131* 132*  K 3.6 3.4* 3.1* 4.2 3.9  CL 89* 85* 84* 92* 97*  CO2 21* 31 26 26 25   GLUCOSE 162* 160* 151* 110* 85  BUN 39* 44* 46* 33* 23  CREATININE 1.51* 1.55* 1.36* 1.18 1.13  CALCIUM 9.4 9.3 8.8* 9.1 8.9  MG  --   --  2.2  --   --    GFR: Estimated Creatinine Clearance: 59 mL/min (by C-G formula based on SCr of 1.13 mg/dL). Liver Function Tests: Recent Labs  Lab 09/14/19 0916 09/15/19 0053 09/18/19 0526  AST 50* 51* 30  ALT 45* 43 28  ALKPHOS 74 69 53  BILITOT 0.6 0.9 0.4  PROT 8.1 7.0 6.5  ALBUMIN 3.3* 3.0* 3.0*   No results for input(s): LIPASE, AMYLASE in the last 168 hours. No results for input(s): AMMONIA in the last 168 hours. Coagulation Profile: No results for input(s): INR, PROTIME in the last 168 hours. Cardiac Enzymes: No results for input(s): CKTOTAL, CKMB, CKMBINDEX, TROPONINI in the last 168 hours. BNP (last 3 results) No results for input(s): PROBNP in the last 8760 hours. HbA1C: No results for input(s): HGBA1C in the last 72 hours. CBG: Recent Labs  Lab 09/18/19 1942 09/18/19 2137 09/19/19 0922 09/19/19 1143 09/19/19 1549  GLUCAP 170* 290* 148* 235* 219*   Lipid Profile: No results for input(s): CHOL, HDL, LDLCALC, TRIG, CHOLHDL, LDLDIRECT in the last 72 hours. Thyroid Function Tests: No results for input(s): TSH, T4TOTAL, FREET4, T3FREE, THYROIDAB in the last 72 hours. Anemia Panel: No results for input(s): VITAMINB12, FOLATE, FERRITIN, TIBC, IRON, RETICCTPCT in the last 72 hours. Urine analysis:    Component Value Date/Time   COLORURINE YELLOW (A) 06/13/2018 1551   APPEARANCEUR CLEAR (A)  06/13/2018 1551   APPEARANCEUR Cloudy (A) 01/31/2018 1321   LABSPEC 1.018 06/13/2018 1551   PHURINE 5.0 06/13/2018 1551   GLUCOSEU NEGATIVE 06/13/2018 1551   HGBUR NEGATIVE 06/13/2018 1551   BILIRUBINUR NEGATIVE 06/13/2018 1551   BILIRUBINUR Negative 01/31/2018 1321   KETONESUR NEGATIVE 06/13/2018 1551   PROTEINUR NEGATIVE 06/13/2018 1551   NITRITE NEGATIVE 06/13/2018 1551   LEUKOCYTESUR NEGATIVE 06/13/2018 1551   LEUKOCYTESUR Negative 01/31/2018 1321   Recent Results (from the past 240 hour(s))  Respiratory Panel by PCR     Status: None   Collection Time: 09/09/19  6:50 PM   Specimen: Nasopharyngeal Swab; Respiratory  Result Value Ref Range Status   Adenovirus NOT DETECTED NOT DETECTED Final   Coronavirus 229E NOT DETECTED NOT DETECTED Final    Comment: (NOTE) The Coronavirus on the Respiratory Panel, DOES NOT test for the novel  Coronavirus (2019 nCoV)    Coronavirus HKU1 NOT DETECTED NOT DETECTED Final   Coronavirus  NL63 NOT DETECTED NOT DETECTED Final   Coronavirus OC43 NOT DETECTED NOT DETECTED Final   Metapneumovirus NOT DETECTED NOT DETECTED Final   Rhinovirus / Enterovirus NOT DETECTED NOT DETECTED Final   Influenza A NOT DETECTED NOT DETECTED Final   Influenza B NOT DETECTED NOT DETECTED Final   Parainfluenza Virus 1 NOT DETECTED NOT DETECTED Final   Parainfluenza Virus 2 NOT DETECTED NOT DETECTED Final   Parainfluenza Virus 3 NOT DETECTED NOT DETECTED Final   Parainfluenza Virus 4 NOT DETECTED NOT DETECTED Final   Respiratory Syncytial Virus NOT DETECTED NOT DETECTED Final   Bordetella pertussis NOT DETECTED NOT DETECTED Final   Chlamydophila pneumoniae NOT DETECTED NOT DETECTED Final   Mycoplasma pneumoniae NOT DETECTED NOT DETECTED Final    Comment: Performed at [facility], [place] [address], [place], [place] [address]  Culture, blood (Routine X 2) w Reflex to ID Panel     Status: None   Collection Time: 09/09/19  9:00 PM   Specimen: BLOOD RIGHT HAND    Result Value Ref Range Status   Specimen Description   Final    BLOOD RIGHT HAND Performed at [facility] [address] [place], [place] [address]    Special Requests   Final    BOTTLES DRAWN AEROBIC ONLY Blood Culture adequate volume Performed at [facility] [address], [place], [place] [address]    Culture   Final    NO GROWTH 5 DAYS Performed at [facility], [place] [address], [place], [place] [address]    Report Status 09/15/2019 FINAL  Final  Culture, blood (Routine X 2) w Reflex to ID Panel     Status: None   Collection Time: 09/09/19  9:00 PM   Specimen: BLOOD LEFT HAND  Result Value Ref Range Status   Specimen Description   Final    BLOOD LEFT HAND Performed at [facility] [address] [place], [place] [address]    Special Requests   Final    BOTTLES DRAWN AEROBIC ONLY Blood Culture adequate volume Performed at [facility] [address], [place], [place] [address]    Culture   Final    NO GROWTH 5 DAYS Performed at [facility], [place] [address] [place], [place] [address]    Report Status 09/15/2019 FINAL  Final  MRSA PCR Screening     Status: None   Collection Time: 09/12/19  4:03 AM   Specimen: Nasal Mucosa; Nasopharyngeal  Result Value Ref Range Status   MRSA by PCR NEGATIVE NEGATIVE Final    Comment:        The GeneXpert MRSA Assay (FDA approved for NASAL specimens only), is one component of a comprehensive MRSA colonization surveillance program. It is not intended to diagnose MRSA infection nor to guide or monitor treatment for MRSA infections. Performed at [facility], [place] [address], [place],  [address]       Radiology Studies: No results found.  Scheduled Meds: . vitamin C  500 mg Oral Daily  . aspirin EC  81 mg Oral Daily  . carvedilol  3.125 mg Oral BID WC  . cetirizine  10 mg Oral Daily  . clopidogrel  75 mg  Oral Daily  . colchicine  0.6 mg Oral Daily  . dicyclomine  10 mg Oral TID AC  . docusate sodium  100 mg Oral BID  . enoxaparin (LOVENOX) injection  40 mg Subcutaneous  A44L  . folic acid  1 mg Oral Daily  . gabapentin  600 mg Oral TID  . insulin aspart  0-20 Units Subcutaneous TID WC  . insulin aspart  0-5 Units Subcutaneous QHS  . insulin detemir  30 Units Subcutaneous BID  . ipratropium  2 spray Each Nare QID  . isosorbide mononitrate  30 mg Oral Daily  . montelukast  10 mg Oral QHS  . pantoprazole  40 mg Oral Daily  . pravastatin  40 mg Oral Daily  . probenecid  1,000 mg Oral BID  . sodium chloride flush  3 mL Intravenous Q12H  . thiamine  100 mg Oral Daily  . zinc sulfate  220 mg Oral Daily   Continuous Infusions:   LOS: 10 days   Time spent: 25 minutes.  Patrecia Pour, MD [facility] [email or web address] 09/19/2019, 6:18 PM

## 2019-09-19 NOTE — Progress Notes (Signed)
Physical Therapy Treatment Patient Details Name: Chase Cabrera MRN: [id number] DOB: July 03, 1946 Today's Date: 09/19/2019    History of Present Illness 74 year old with a history of CHF, CAD, Crohn's disease, DM 2, GERD, HTN, gout, and HLD who presented to [facility] 12/22 and was admitted  with acute onset shortness of breath and generalized weakness , temperature of 102 and saturations of 90% on room air.  He was found to be Covid positive and a CXR noted pulmonary vascular congestion.  12/27 he was transferred to [facility].    PT Comments    Pt needs increased time and also encouragement to complete tasks. He is highly apprehensive with mobility and states that he feels as if he cant breathe even when satting at 97%.  Initially was able to get to EOB with min a and was able to sit unsupported EOB, pt insists that he has not got out of bed in three weeks now, but he has been documented to have worked with therapy this week. He requested to use BSC prior to ambulation testing, pt needed min a with RW to transfer from bed to [facility], he was instructed to press his call bell when completed, and he insists he did, but he was still in same position at therapist return. On second attempt was able to stand from [facility] and transfer to recliner with min-mod a and RW, able to ambulate 2 x 81f with RW and mod a. Pt fatigues quickly becomes apprehensive and insists he is going to fall. With verbal and tactile cues able to complete distance. Pt was initially on 3L/min via [place] and was able to ambulate approx 163fand sats remained in low 90s when placed on room air and ambulated same distance sats dropped to min 79%. Probe was initially on right hand, was changed to earlobe probe for a more accurate reading. Dc disposition is the same pt will need 24 supervision and assist at dc.   Follow Up Recommendations  Home health PT;Supervision/Assistance - 24 hour(if not then will need SNF)     Equipment Recommendations        Recommendations for Other Services       Precautions / Restrictions Precautions Precautions: Fall Precaution Comments: some 02 deficits but more apprehension Restrictions Weight Bearing Restrictions: No    Mobility  Bed Mobility Overal bed mobility: Needs Assistance Bed Mobility: Supine to Sit     Supine to sit: Min assist        Transfers Overall transfer level: Needs assistance Equipment used: Rolling walker (2 wheeled) Transfers: Sit to/from [facility] to Stand: Min assist Stand pivot transfers: Min assist          Ambulation/Gait Ambulation/Gait assistance: Mod assist Gait Distance (Feet): (36) Assistive device: Rolling walker (2 wheeled) Gait Pattern/deviations: Step-to pattern;Shuffle;Wide base of support;Trunk flexed Gait velocity: very slow   General Gait Details: ambulated 3632f 2 with RW and mod a, fatigues quickly needing encouragement to complete distance,    Stairs             Wheelchair Mobility    Modified Rankin (Stroke Patients Only)       Balance Overall balance assessment: Needs assistance Sitting-balance support: Feet supported Sitting balance-Leahy Scale: Fair Sitting balance - Comments: able to sit unsupported EOB and complete UE activity   Standing balance support: During functional activity;Bilateral upper extremity supported Standing balance-Leahy Scale: Fair  Cognition Arousal/Alertness: Awake/alert Behavior During Therapy: WFL for tasks assessed/performed Overall Cognitive Status: No family/caregiver present to determine baseline cognitive functioning Area of Impairment: (seems impared from some things he says, also [facility])                                      Exercises Other Exercises Other Exercises: reinforced IS and also flutter valve use    General Comments        Pertinent Vitals/Pain Pain Assessment: No/denies pain     Home Living                      Prior Function            PT Goals (current goals can now be found in the care plan section) Acute Rehab PT Goals Patient Stated Goal: states that he cant go home if he is unable to catch his breath when he stands Time For Goal Achievement: 09/26/19 Potential to Achieve Goals: Fair Progress towards PT goals: Progressing toward goals    Frequency    Min 3X/week      PT Plan Current plan remains appropriate    Co-evaluation              AM-PAC PT "6 Clicks" Mobility   Outcome Measure  Help needed turning from your back to your side while in a flat bed without using bedrails?: A Little Help needed moving from lying on your back to sitting on the side of a flat bed without using bedrails?: A Little Help needed moving to and from a bed to a chair (including a wheelchair)?: A Lot Help needed standing up from a chair using your arms (e.g., wheelchair or bedside chair)?: A Little Help needed to walk in hospital room?: A Lot Help needed climbing 3-5 steps with a railing? : A Lot 6 Click Score: 15    End of Session Equipment Utilized During Treatment: Oxygen Activity Tolerance: Treatment limited secondary to medical complications (Comment);Patient limited by fatigue Patient left: in chair;with call bell/phone within reach;with chair alarm set Nurse Communication: Mobility status PT Visit Diagnosis: Other abnormalities of gait and mobility (R26.89);Muscle weakness (generalized) (M62.81);Difficulty in walking, not elsewhere classified (R26.2)     Time: 1041-1110, 1000-1011 PT Time Calculation (min) (ACUTE ONLY): 29 min  PT Time Calculation (min) (ACUTE ONLY): 79mn  Charges:  $Gait Training: 23-37 mins $Therapeutic Activity: 8-22 mins                     LHorald Chestnut PT    LDelford Field1/02/2020, 12:43 PM

## 2019-09-20 LAB — GLUCOSE, CAPILLARY
Glucose-Capillary: 103 mg/dL — ABNORMAL HIGH (ref 70–99)
Glucose-Capillary: 212 mg/dL — ABNORMAL HIGH (ref 70–99)
Glucose-Capillary: 159 mg/dL — ABNORMAL HIGH (ref 70–99)
Glucose-Capillary: 216 mg/dL — ABNORMAL HIGH (ref 70–99)

## 2019-09-20 MED ORDER — FUROSEMIDE 10 MG/ML IJ SOLN
40.0000 mg | Freq: Once | INTRAMUSCULAR | Status: AC
  Administered 2019-09-20: 17:00:00 40 mg via INTRAVENOUS
  Filled 2019-09-20: qty 4

## 2019-09-20 MED ORDER — INSULIN ASPART 100 UNIT/ML ~~LOC~~ SOLN
4.0000 [IU] | Freq: Three times a day (TID) | SUBCUTANEOUS | Status: DC
  Administered 2019-09-20 – 2019-09-21 (×6): 4 [IU] via SUBCUTANEOUS

## 2019-09-20 NOTE — Progress Notes (Signed)
Pt forgetful and pulling O2 monitor off. Will continue to monitor

## 2019-09-20 NOTE — Progress Notes (Signed)
Physical Therapy Treatment Patient Details Name: Chase Cabrera MRN: [id number] DOB: Mar 31, 1946 Today's Date: 09/20/2019    History of Present Illness 74 year old with a history of CHF, CAD, Crohn's disease, DM 2, GERD, HTN, gout, and HLD who presented to [facility] 12/22 and was admitted  with acute onset shortness of breath and generalized weakness , temperature of 102 and saturations of 90% on room air.  He was found to be Covid positive and a CXR noted pulmonary vascular congestion.  12/27 he was transferred to [facility].    PT Comments    Pt making some progress with mobility, still having apprehension about desaturation with activity needing increased recovery time and cues for pursed lip breathing. Pt was able to ambulate approx 28f with RW and min a this pm, towards end of distance needed increased cues for breathing and also posture correction. Pt on 4L/min via [place] desat to min 84% with ambulation. Once seated needed approx 289m to recover sats to 90s w/ cues for pursed lip breathing and also flutter valve use.     Follow Up Recommendations  Home health PT;Supervision/Assistance - 24 hour     Equipment Recommendations  3in1 (PT)    Recommendations for Other Services       Precautions / Restrictions Precautions Precautions: Fall Restrictions Weight Bearing Restrictions: No    Mobility  Bed Mobility               General bed mobility comments: Pt received in chair  Transfers Overall transfer level: Needs assistance Equipment used: Rolling walker (2 wheeled) Transfers: Sit to/from Stand Sit to Stand: Min guard            Ambulation/Gait Ambulation/Gait assistance: Min assist Gait Distance (Feet): 46 Feet Assistive device: Rolling walker (2 wheeled) Gait Pattern/deviations: Step-through pattern;Shuffle;Wide base of support;Trunk flexed Gait velocity: very slow   General Gait Details: ambulated approx 4693fith RW and min assist, tends to stoop lower and  lower towards end of distance, pt on 4L/min and desat to min 84% during ambulation   Stairs             Wheelchair Mobility    Modified Rankin (Stroke Patients Only)       Balance Overall balance assessment: Needs assistance Sitting-balance support: Feet supported Sitting balance-Leahy Scale: Good     Standing balance support: Bilateral upper extremity supported;During functional activity Standing balance-Leahy Scale: Poor                              Cognition Arousal/Alertness: Awake/alert Behavior During Therapy: WFL for tasks assessed/performed Overall Cognitive Status: No family/caregiver present to determine baseline cognitive functioning                           Safety/Judgement: Decreased awareness of safety;Decreased awareness of deficits            Exercises Other Exercises Other Exercises: flutter valve use to recover after ambulation    General Comments        Pertinent Vitals/Pain Pain Assessment: No/denies pain    Home Living                      Prior Function            PT Goals (current goals can now be found in the care plan section) Acute Rehab PT Goals Patient Stated Goal: still feeling  winded with activity PT Goal Formulation: With patient Time For Goal Achievement: 09/26/19 Potential to Achieve Goals: Fair Progress towards PT goals: Progressing toward goals    Frequency    Min 3X/week      PT Plan Current plan remains appropriate    Co-evaluation              AM-PAC PT "6 Clicks" Mobility   Outcome Measure  Help needed turning from your back to your side while in a flat bed without using bedrails?: A Little Help needed moving from lying on your back to sitting on the side of a flat bed without using bedrails?: A Little Help needed moving to and from a bed to a chair (including a wheelchair)?: A Lot Help needed standing up from a chair using your arms (e.g., wheelchair or  bedside chair)?: A Little Help needed to walk in hospital room?: A Little Help needed climbing 3-5 steps with a railing? : A Lot 6 Click Score: 16    End of Session Equipment Utilized During Treatment: Oxygen Activity Tolerance: Patient limited by fatigue Patient left: in chair;with call bell/phone within reach;with chair alarm set   PT Visit Diagnosis: Other abnormalities of gait and mobility (R26.89);Muscle weakness (generalized) (M62.81);Difficulty in walking, not elsewhere classified (R26.2)     Time: 1413-1430 PT Time Calculation (min) (ACUTE ONLY): 17 min  Charges:  $Gait Training: 8-22 mins                     Horald Chestnut, PT    Delford Field 09/20/2019, 4:11 PM

## 2019-09-20 NOTE — Progress Notes (Signed)
Occupational Therapy Treatment Patient Details Name: Chase Cabrera MRN: [id number] DOB: 1945/11/03 Today's Date: 09/20/2019    History of present illness 74 year old with a history of CHF, CAD, Crohn's disease, DM 2, GERD, HTN, gout, and HLD who presented to [facility] 12/22 and was admitted  with acute onset shortness of breath and generalized weakness , temperature of 102 and saturations of 90% on room air.  He was found to be Covid positive and a CXR noted pulmonary vascular congestion.  12/27 he was transferred to [facility].   OT comments  Pt making progress in therapy, demonstrating increased independence with self-care and functional transfer tasks as well as requiring less supplemental oxygen. Pt currently on 4L [place] (1 extension) with SpO2 94% at rest. Pt able to ambulate 3-4 feet to room sink with RW and min guard. Noted 0 instances of loss of balance. Pt tolerated standing 1 x 1.5 min, 1 x 2.5 min, and 1 x 3.5 min with min guard to complete grooming/hygiene tasks. SpO2 decreased to mid 70s with each stand. Educated pt on pursed lip breathing strategies with pt requiring mod cues on technique. Pt required 3-4 min seated rest break to recover back to 90%. 2/4 DOE. SpO2 93% on 4L (no extension) at end of session. Pt more verbal this date, requiring mod redirection back to tasks as pt was easily distracted by personal conversation. OT will continue to follow acutely.    Follow Up Recommendations  Home health OT;Supervision/Assistance - 24 hour(pt may continue to require SNF depending on progress)    Equipment Recommendations  None recommended by OT    Recommendations for Other Services      Precautions / Restrictions Precautions Precautions: Fall Restrictions Weight Bearing Restrictions: No       Mobility Bed Mobility               General bed mobility comments: Pt seated in bedside chair upon OT arrival.  Transfers Overall transfer level: Needs assistance Equipment used:  Rolling walker (2 wheeled) Transfers: Sit to/from [facility] Sit to Stand: Min guard Stand pivot transfers: Min guard       General transfer comment: Pt able to stand pivot over to room sink with RW and min guard. Noted 0 instances of LOB.     Balance Overall balance assessment: Needs assistance Sitting-balance support: Feet supported Sitting balance-Leahy Scale: Good       Standing balance-Leahy Scale: Poor Standing balance comment: Pt required UE support for balance during static standing task.                           ADL either performed or assessed with clinical judgement   ADL Overall ADL's : Needs assistance/impaired Eating/Feeding: Independent;Sitting   Grooming: Min guard;Standing;Wash/dry hands;Wash/dry face;Oral care;Brushing hair                               Functional mobility during ADLs: Min guard;Rolling walker       Vision       Perception     Praxis      Cognition Arousal/Alertness: Awake/alert Behavior During Therapy: WFL for tasks assessed/performed Overall Cognitive Status: No family/caregiver present to determine baseline cognitive functioning  Exercises Exercises: Other exercises Other Exercises Other Exercises: Pursed lip breathing throughout with mod cues on technique.   Shoulder Instructions       General Comments Pt on 4L [place] with 1 extension with SpO2 94% at rest. Pt engaged in standing tolerance task while completing grooming/hygiene at the sink. SpO2 decreased to mid 70s during task on 4L [place]. Pt required 3-4 min seated recovery to return to 90%. Educated pt on pursed lip breathing techniques.    Pertinent Vitals/ Pain       Pain Assessment: No/denies pain  Home Living                                          Prior Functioning/Environment              Frequency           Progress Toward Goals  OT  Goals(current goals can now be found in the care plan section)  Progress towards OT goals: Progressing toward goals  ADL Goals Pt Will Perform Grooming: with supervision;standing Pt Will Perform Lower Body Bathing: with supervision;sitting/lateral leans;sit to/from stand Pt Will Perform Lower Body Dressing: sitting/lateral leans;sit to/from stand;with supervision Pt Will Transfer to Toilet: with min assist;bedside commode Pt Will Perform Toileting - Clothing Manipulation and hygiene: with min assist;sit to/from stand;sitting/lateral leans Additional ADL Goal #1: Pt to demonstrate breathing exercises with 0 verbal cues. Additional ADL Goal #2: Pt to stand up to 5 min with min guard, in preparation for ADLs.  Plan Discharge plan needs to be updated    Co-evaluation                 AM-PAC OT "6 Clicks" Daily Activity     Outcome Measure   Help from another person eating meals?: None Help from another person taking care of personal grooming?: A Little Help from another person toileting, which includes using toliet, bedpan, or urinal?: A Lot Help from another person bathing (including washing, rinsing, drying)?: A Lot Help from another person to put on and taking off regular upper body clothing?: A Little Help from another person to put on and taking off regular lower body clothing?: A Lot 6 Click Score: 16    End of Session Equipment Utilized During Treatment: Rolling walker;Oxygen  OT Visit Diagnosis: Unsteadiness on feet (R26.81);Muscle weakness (generalized) (M62.81)   Activity Tolerance Patient limited by fatigue(Limited by SOB)   Patient Left in chair;with call bell/phone within reach;with chair alarm set   Nurse Communication Mobility status        Time: 0825-0900 OT Time Calculation (min): 35 min  Charges: OT General Charges $OT Visit: 1 Visit OT Treatments $Self Care/Home Management : 8-22 mins $Therapeutic Activity: 8-22 mins  Mauri Brooklyn  OTR/L [phone]    Mauri Brooklyn 09/20/2019, 12:18 PM

## 2019-09-20 NOTE — Progress Notes (Addendum)
PROGRESS NOTE  Chase Cabrera  TDV:[id number] DOB: 17-Jan-1946 DOA: 09/09/2019 PCP: McLean-Scocuzza, Nino Glow, MD   Brief Narrative: 74 year old with a history of CHF, CAD, Crohn's disease, DM 2, GERD, HTN, gout, and HLD who presented to [facility] 12/22 with acute onset shortness of breath and generalized weakness which had been present for 4 days. In the ED he was found to have a temperature of 102 and saturations of 90% on room air.  He was found to be Covid positive and a CXR noted pulmonary vascular congestion.  He was admitted and treatment was initiated with remdesivir and Decadron. 12/27 he was transferred to [facility].  Significant Events: 12/22 admit to [facility] 12/27 transfer to [facility]  COVID-19 Specific Treatment: Decadron 12/22 > 12/28 Remdesivir 12/22 > 12/26 Actemra 12/24  Antimicrobials:  Azithromycin 12/22 Rocephin 12/22  Assessment & Plan: Principal Problem:   Acute respiratory failure with hypoxia [facility]) Active Problems:   Atrial fibrillation (HCC)   DM type 2 with diabetic peripheral neuropathy (HCC)   Hyperlipidemia   Essential hypertension   BPH (benign prostatic hyperplasia)   Pneumonia due to COVID-19 virus   Pressure injury of skin  Acute hypoxic respiratory failure due to covid-19 pneumonia:  - s/p remdesivir 12/22 - 12/26 - Was given decadron x7 days. Recheck inflammatory markers, consider reinitiation of steroids - s/p tocilizumab 12/24.  - Remains hypoxic at rest with decreased functional mobility. - Continue FV, IS, frequent mobilization. Diurese as below. - Vit C, zinc  Hyponatremia:  - Hold aldactone  Hypokalemia: Due to diuresis, resolved  Acute kidney injury: Creatinine 1.04 October this year - creatinine stable following discontinuation of diuretic. - Monitor intermittently.  Chronic atrial fibrillation with bradycardic response:  - Continue beta blocker at reduced dose.  - Not on anticoagulation PTA Continue plavix and lovenox DVT  ppx.   Chronic combined systolic and diastolic CHF, CAD: EF 06-26% via TTE Jan 2020. Typically treated with Lasix 80 mg QOD - baseline weight approximately 86kg per old records - Will augment to daily IV lasix starting now, check weight today and daily, strict I/O, add heart healthy restriction to diet.  - Recheck CXR and BNP in AM. - Continue coreg, decreased dose due to bradycardia though this is not severe or symptomatic. Continue imdur.  T2DM uncontrolled with peripheral neuropathy and hyperglycemia: CBG much improved  - Continue levemir 30u BID and SSI - Continue neurontin  HLD:  - Continue statin  GERD, history of Crohn's:  - Continue PPI, bentyl, vitamin supplementation  BPH - Continue usual home medication  DVT prophylaxis: Lovenox Code Status: Full Family Communication: None at bedside. Called wife without answer today. Disposition Plan: SNF more likely to be necessary with protracted hypoxemia. Will ask for frequent PT/OT while still admitted.   Consultants:   None  Procedures:   None  Antimicrobials:  Remdesivir 12/22 - 12/26  Azithromycin, ceftriaxone 12/22 x1  Subjective: Getting OOB more over the past 24 hours, short of breath at rest, moderately, constantly, but less than previously in hospitalization, worse with exertion. Feels diffusely very weak but not focally.   Objective: Vitals:   09/19/19 1633 09/19/19 2000 09/20/19 0200 09/20/19 0721  BP: 103/88 (!) 159/58 (!) 176/62 (!) 142/60  Pulse: (!) 47 (!) 50  (!) 45  Resp:    18  Temp:  97.8 F (36.6 C) 97.8 F (36.6 C) 98.8 F (37.1 C)  TempSrc:  Oral Oral Oral  SpO2: 95%  93% 92%  Weight:  Height:        Intake/Output Summary (Last 24 hours) at 09/20/2019 1546 Last data filed at 09/19/2019 2300 Gross per 24 hour  Intake 403 ml  Output --  Net 403 ml   Filed Weights   09/09/19 1821  Weight: 82.1 kg   Gen: 74 y.o. male in no distress Pulm: Nonlabored tachypnea at rest w/4L O2. No  wheezes or dependent crackles. CV: Regular rate and rhythm. No murmur, rub, or gallop. No JVD, no dependent edema. GI: Abdomen soft, non-tender, non-distended, with normoactive bowel sounds.  Ext: Warm, no deformities Skin: No rashes, lesions or ulcers on visualized skin. Neuro: Alert and oriented. Distractible but follows commands. No focal neurological deficits. Psych: Judgement and insight appear fair. Mood euthymic & affect congruent. Behavior is appropriate.    Data Reviewed: I have personally reviewed following labs and imaging studies  CBC: Recent Labs  Lab 09/14/19 0916 09/15/19 0053  WBC 8.7 11.2*  HGB 16.5 14.8  HCT 49.7 43.7  MCV 101.2* 100.2*  PLT 473* 323   Basic Metabolic Panel: Recent Labs  Lab 09/14/19 0916 09/15/19 0053 09/16/19 0420 09/18/19 0526  NA 132* 127* 131* 132*  K 3.4* 3.1* 4.2 3.9  CL 85* 84* 92* 97*  CO2 31 26 26 25   GLUCOSE 160* 151* 110* 85  BUN 44* 46* 33* 23  CREATININE 1.55* 1.36* 1.18 1.13  CALCIUM 9.3 8.8* 9.1 8.9  MG  --  2.2  --   --    GFR: Estimated Creatinine Clearance: 59 mL/Chase (by C-G formula based on SCr of 1.13 mg/dL). Liver Function Tests: Recent Labs  Lab 09/14/19 0916 09/15/19 0053 09/18/19 0526  AST 50* 51* 30  ALT 45* 43 28  ALKPHOS 74 69 53  BILITOT 0.6 0.9 0.4  PROT 8.1 7.0 6.5  ALBUMIN 3.3* 3.0* 3.0*   No results for input(s): LIPASE, AMYLASE in the last 168 hours. No results for input(s): AMMONIA in the last 168 hours. Coagulation Profile: No results for input(s): INR, PROTIME in the last 168 hours. Cardiac Enzymes: No results for input(s): CKTOTAL, CKMB, CKMBINDEX, TROPONINI in the last 168 hours. BNP (last 3 results) No results for input(s): PROBNP in the last 8760 hours. HbA1C: No results for input(s): HGBA1C in the last 72 hours. CBG: Recent Labs  Lab 09/19/19 1143 09/19/19 1549 09/19/19 2026 09/20/19 0724 09/20/19 1116  GLUCAP 235* 219* 239* 103* 212*   Lipid Profile: No results for  input(s): CHOL, HDL, LDLCALC, TRIG, CHOLHDL, LDLDIRECT in the last 72 hours. Thyroid Function Tests: No results for input(s): TSH, T4TOTAL, FREET4, T3FREE, THYROIDAB in the last 72 hours. Anemia Panel: No results for input(s): VITAMINB12, FOLATE, FERRITIN, TIBC, IRON, RETICCTPCT in the last 72 hours. Urine analysis:    Component Value Date/Time   COLORURINE YELLOW (A) 06/13/2018 1551   APPEARANCEUR CLEAR (A) 06/13/2018 1551   APPEARANCEUR Cloudy (A) 01/31/2018 1321   LABSPEC 1.018 06/13/2018 1551   PHURINE 5.0 06/13/2018 1551   GLUCOSEU NEGATIVE 06/13/2018 1551   HGBUR NEGATIVE 06/13/2018 1551   BILIRUBINUR NEGATIVE 06/13/2018 1551   BILIRUBINUR Negative 01/31/2018 1321   KETONESUR NEGATIVE 06/13/2018 1551   PROTEINUR NEGATIVE 06/13/2018 1551   NITRITE NEGATIVE 06/13/2018 1551   LEUKOCYTESUR NEGATIVE 06/13/2018 1551   LEUKOCYTESUR Negative 01/31/2018 1321   Recent Results (from the past 240 hour(s))  MRSA PCR Screening     Status: None   Collection Time: 09/12/19  4:03 AM   Specimen: Nasal Mucosa; Nasopharyngeal  Result Value Ref  Range Status   MRSA by PCR NEGATIVE NEGATIVE Final    Comment:        The GeneXpert MRSA Assay (FDA approved for NASAL specimens only), is one component of a comprehensive MRSA colonization surveillance program. It is not intended to diagnose MRSA infection nor to guide or monitor treatment for MRSA infections. Performed at [facility], [place] [address], [place], [place] [address]       Radiology Studies: No results found.  Scheduled Meds: . vitamin C  500 mg Oral Daily  . aspirin EC  81 mg Oral Daily  . carvedilol  3.125 mg Oral BID WC  . cetirizine  10 mg Oral Daily  . clopidogrel  75 mg Oral Daily  . colchicine  0.6 mg Oral Daily  . dicyclomine  10 mg Oral TID AC  . docusate sodium  100 mg Oral BID  . enoxaparin (LOVENOX) injection  40 mg Subcutaneous Q24H  . folic acid  1 mg Oral Daily  . gabapentin  600 mg Oral  TID  . insulin aspart  0-20 Units Subcutaneous TID WC  . insulin aspart  0-5 Units Subcutaneous QHS  . insulin aspart  4 Units Subcutaneous TID WC  . insulin detemir  30 Units Subcutaneous BID  . ipratropium  2 spray Each Nare QID  . isosorbide mononitrate  30 mg Oral Daily  . montelukast  10 mg Oral QHS  . pantoprazole  40 mg Oral Daily  . pravastatin  40 mg Oral Daily  . probenecid  1,000 mg Oral BID  . sodium chloride flush  3 mL Intravenous Q12H  . thiamine  100 mg Oral Daily  . zinc sulfate  220 mg Oral Daily   Continuous Infusions:   LOS: 11 days   Time spent: 35 minutes.  Patrecia Pour, MD [facility] [email or web address] 09/20/2019, 3:46 PM

## 2019-09-21 ENCOUNTER — Inpatient Hospital Stay (HOSPITAL_COMMUNITY): Payer: Medicare Other

## 2019-09-21 LAB — CBC WITH DIFFERENTIAL/PLATELET
Abs Immature Granulocytes: 0.04 10*3/uL (ref 0.00–0.07)
Basophils Absolute: 0 10*3/uL (ref 0.0–0.1)
Basophils Relative: 0 %
Eosinophils Absolute: 0.6 10*3/uL — ABNORMAL HIGH (ref 0.0–0.5)
Eosinophils Relative: 8 %
HCT: 41 % (ref 39.0–52.0)
Hemoglobin: 13.8 g/dL (ref 13.0–17.0)
Immature Granulocytes: 1 %
Lymphocytes Relative: 25 %
Lymphs Abs: 1.8 10*3/uL (ref 0.7–4.0)
MCH: 34.7 pg — ABNORMAL HIGH (ref 26.0–34.0)
MCHC: 33.7 g/dL (ref 30.0–36.0)
MCV: 103 fL — ABNORMAL HIGH (ref 80.0–100.0)
Monocytes Absolute: 0.8 10*3/uL (ref 0.1–1.0)
Monocytes Relative: 11 %
Neutro Abs: 4 10*3/uL (ref 1.7–7.7)
Neutrophils Relative %: 55 %
Platelets: 189 10*3/uL (ref 150–400)
RBC: 3.98 MIL/uL — ABNORMAL LOW (ref 4.22–5.81)
RDW: 14.6 % (ref 11.5–15.5)
WBC: 7.3 10*3/uL (ref 4.0–10.5)
nRBC: 0 % (ref 0.0–0.2)

## 2019-09-21 LAB — COMPREHENSIVE METABOLIC PANEL
ALT: 34 U/L (ref 0–44)
AST: 29 U/L (ref 15–41)
Albumin: 3.1 g/dL — ABNORMAL LOW (ref 3.5–5.0)
Alkaline Phosphatase: 57 U/L (ref 38–126)
Anion gap: 13 (ref 5–15)
BUN: 22 mg/dL (ref 8–23)
CO2: 24 mmol/L (ref 22–32)
Calcium: 9.1 mg/dL (ref 8.9–10.3)
Chloride: 97 mmol/L — ABNORMAL LOW (ref 98–111)
Creatinine, Ser: 1.12 mg/dL (ref 0.61–1.24)
GFR calc Af Amer: >60 mL/min (ref >60)
GFR calc non Af Amer: >60 mL/min (ref >60)
Glucose, Bld: 136 mg/dL — ABNORMAL HIGH (ref 70–99)
Potassium: 3.8 mmol/L (ref 3.5–5.1)
Sodium: 134 mmol/L — ABNORMAL LOW (ref 135–145)
Total Bilirubin: 0.4 mg/dL (ref 0.3–1.2)
Total Protein: 6.9 g/dL (ref 6.5–8.1)

## 2019-09-21 LAB — BRAIN NATRIURETIC PEPTIDE: B Natriuretic Peptide: 127.1 pg/mL — ABNORMAL HIGH (ref 0.0–100.0)

## 2019-09-21 LAB — GLUCOSE, CAPILLARY
Glucose-Capillary: 103 mg/dL — ABNORMAL HIGH (ref 70–99)
Glucose-Capillary: 203 mg/dL — ABNORMAL HIGH (ref 70–99)
Glucose-Capillary: 319 mg/dL — ABNORMAL HIGH (ref 70–99)

## 2019-09-21 LAB — C-REACTIVE PROTEIN: CRP: 2.3 mg/dL — ABNORMAL HIGH (ref <1.0)

## 2019-09-21 LAB — D-DIMER, QUANTITATIVE: D-Dimer, Quant: 0.52 ug/mL-FEU — ABNORMAL HIGH (ref 0.00–0.50)

## 2019-09-21 MED ORDER — FUROSEMIDE 10 MG/ML IJ SOLN
60.0000 mg | Freq: Two times a day (BID) | INTRAMUSCULAR | Status: DC
  Administered 2019-09-21 – 2019-09-23 (×6): 60 mg via INTRAVENOUS
  Filled 2019-09-21 (×6): qty 6

## 2019-09-21 MED ORDER — DEXAMETHASONE SODIUM PHOSPHATE 10 MG/ML IJ SOLN
6.0000 mg | Freq: Every day | INTRAMUSCULAR | Status: DC
  Administered 2019-09-21 – 2019-09-25 (×5): 6 mg via INTRAVENOUS
  Filled 2019-09-21 (×5): qty 1

## 2019-09-21 NOTE — Progress Notes (Signed)
PROGRESS NOTE  Chase Cabrera  AJO:[id number] DOB: 1946-09-11 DOA: 09/09/2019 PCP: McLean-Scocuzza, Nino Glow, MD   Brief Narrative: 74 year old with a history of CHF, CAD, Crohn's disease, DM 2, GERD, HTN, gout, and HLD who presented to [facility] 12/22 with acute onset shortness of breath and generalized weakness which had been present for 4 days. In the ED he was found to have a temperature of 102 and saturations of 90% on room air.  He was found to be Covid positive and a CXR noted pulmonary vascular congestion.  He was admitted and treatment was initiated with remdesivir and Decadron. 12/27 he was transferred to [facility].  Significant Events: 12/22 admit to [facility] 12/27 transfer to [facility]  COVID-19 Specific Treatment: Decadron 12/22 > 12/28 Remdesivir 12/22 > 12/26 Actemra 12/24  Antimicrobials:  Azithromycin 12/22 Rocephin 12/22  Assessment & Plan: Principal Problem:   Acute respiratory failure with hypoxia [facility]) Active Problems:   Atrial fibrillation (HCC)   DM type 2 with diabetic peripheral neuropathy (HCC)   Hyperlipidemia   Essential hypertension   BPH (benign prostatic hyperplasia)   Pneumonia due to COVID-19 virus   Pressure injury of skin  Acute hypoxic respiratory failure due to covid-19 pneumonia:  - s/p remdesivir 12/22 - 12/26 - Was given decadron x7 days. CRP remains elevated and CXR infiltrates persist with possible increasing LUL, so will restart steroids and trend CRP. - s/p tocilizumab 12/24.  - Remains hypoxic at rest with decreased functional mobility. - Continue FV, IS, frequent mobilization. Diurese as below. - Vit C, zinc  Hyponatremia:  - Holding aldactone  Hypokalemia: Due to diuresis, resolved  Acute kidney injury: Creatinine 1.04 putative baseline. - Monitor with diuresis  Chronic atrial fibrillation with bradycardic response:  - Continue beta blocker at reduced dose.  - Not on anticoagulation PTA. Continue plavix and lovenox DVT ppx.    Chronic combined systolic and diastolic CHF, CAD: EF 94-70% via TTE Jan 2020. Typically treated with Lasix 80 mg QOD - baseline weight approximately 86kg per old records - Continue lasix 84m IV BID, BNP elevated and CXR consistent with an element of pulmonary edema on my personal review this morning. - Need strict I/O (incompletely charted UOP) and daily weights (care order to RN to document weights) - Continue coreg, decreased dose due to bradycardia though this is not severe or symptomatic. Continue imdur.  T2DM uncontrolled with peripheral neuropathy and hyperglycemia: CBG much improved  - Continue levemir 30u BID and SSI - Continue neurontin  HLD:  - Continue statin  GERD, history of Crohn's:  - Continue PPI, bentyl, vitamin supplementation  BPH - Continue usual home medication  DVT prophylaxis: Lovenox Code Status: Full Family Communication: None at bedside. Called wife without answer yesterday, will call son DMarlou Satoday. Disposition Plan: SNF more likely to be necessary with protracted hypoxemia, remains hypoxemic despite 4L O2. Continue frequent ambulation/therapy.    Consultants:   None  Procedures:   None  Antimicrobials:  Remdesivir 12/22 - 12/26  Azithromycin, ceftriaxone 12/22 x1  Subjective: Continues to have severe dyspnea with exertion improved with supplemental oxygen. Only made it 353ftoday with SpO2 79% despite 4L O2. No chest pain. Urinating frequently with lasix, no leg swelling.   Objective: Vitals:   09/20/19 1707 09/20/19 2000 09/21/19 0600 09/21/19 0808  BP: (!) 148/65 138/65 (!) 138/49 (!) 151/55  Pulse: (!) 51  (!) 49 (!) 50  Resp:      Temp:  98.7 F (37.1 C) 98.6 F (37  C) (!) 97.5 F (36.4 C)  TempSrc: Oral Oral Oral Axillary  SpO2: 93%   92%  Weight:      Height:        Intake/Output Summary (Last 24 hours) at 09/21/2019 1457 Last data filed at 09/21/2019 1340 Gross per 24 hour  Intake 1353 ml  Output 850 ml  Net 503 ml    Filed Weights   09/09/19 1821  Weight: 82.1 kg   Gen: 74 y.o. male in no distress Pulm: Nonlabored at rest, tachypneic with minimal exertion. No wheezes. CV: Regular rate and rhythm. No murmur, rub, or gallop. No JVD, no dependent edema. GI: Abdomen soft, non-tender, non-distended, with normoactive bowel sounds.  Ext: Warm, no deformities Skin: No rashes, lesions or ulcers on visualized skin. Neuro: Alert and oriented. No focal neurological deficits. Psych: Judgement and insight appear mildly impaired. Mood euthymic & affect congruent. Behavior is appropriate.    Data Reviewed: I have personally reviewed following labs and imaging studies  CBC: Recent Labs  Lab 09/15/19 0053 09/21/19 0042  WBC 11.2* 7.3  NEUTROABS  --  4.0  HGB 14.8 13.8  HCT 43.7 41.0  MCV 100.2* 103.0*  PLT 379 664   Basic Metabolic Panel: Recent Labs  Lab 09/15/19 0053 09/16/19 0420 09/18/19 0526 09/21/19 0042  NA 127* 131* 132* 134*  K 3.1* 4.2 3.9 3.8  CL 84* 92* 97* 97*  CO2 26 26 25 24   GLUCOSE 151* 110* 85 136*  BUN 46* 33* 23 22  CREATININE 1.36* 1.18 1.13 1.12  CALCIUM 8.8* 9.1 8.9 9.1  MG 2.2  --   --   --    GFR: Estimated Creatinine Clearance: 59.6 mL/min (by C-G formula based on SCr of 1.12 mg/dL). Liver Function Tests: Recent Labs  Lab 09/15/19 0053 09/18/19 0526 09/21/19 0042  AST 51* 30 29  ALT 43 28 34  ALKPHOS 69 53 57  BILITOT 0.9 0.4 0.4  PROT 7.0 6.5 6.9  ALBUMIN 3.0* 3.0* 3.1*   No results for input(s): LIPASE, AMYLASE in the last 168 hours. No results for input(s): AMMONIA in the last 168 hours. Coagulation Profile: No results for input(s): INR, PROTIME in the last 168 hours. Cardiac Enzymes: No results for input(s): CKTOTAL, CKMB, CKMBINDEX, TROPONINI in the last 168 hours. BNP (last 3 results) No results for input(s): PROBNP in the last 8760 hours. HbA1C: No results for input(s): HGBA1C in the last 72 hours. CBG: Recent Labs  Lab 09/20/19 1116  09/20/19 1631 09/20/19 2059 09/21/19 0807 09/21/19 1311  GLUCAP 212* 159* 216* 103* 203*   Lipid Profile: No results for input(s): CHOL, HDL, LDLCALC, TRIG, CHOLHDL, LDLDIRECT in the last 72 hours. Thyroid Function Tests: No results for input(s): TSH, T4TOTAL, FREET4, T3FREE, THYROIDAB in the last 72 hours. Anemia Panel: No results for input(s): VITAMINB12, FOLATE, FERRITIN, TIBC, IRON, RETICCTPCT in the last 72 hours. Urine analysis:    Component Value Date/Time   COLORURINE YELLOW (A) 06/13/2018 1551   APPEARANCEUR CLEAR (A) 06/13/2018 1551   APPEARANCEUR Cloudy (A) 01/31/2018 1321   LABSPEC 1.018 06/13/2018 1551   PHURINE 5.0 06/13/2018 1551   GLUCOSEU NEGATIVE 06/13/2018 1551   HGBUR NEGATIVE 06/13/2018 1551   BILIRUBINUR NEGATIVE 06/13/2018 1551   BILIRUBINUR Negative 01/31/2018 1321   KETONESUR NEGATIVE 06/13/2018 1551   PROTEINUR NEGATIVE 06/13/2018 1551   NITRITE NEGATIVE 06/13/2018 1551   LEUKOCYTESUR NEGATIVE 06/13/2018 1551   LEUKOCYTESUR Negative 01/31/2018 1321   Recent Results (from the past 240 hour(s))  MRSA PCR Screening     Status: None   Collection Time: 09/12/19  4:03 AM   Specimen: Nasal Mucosa; Nasopharyngeal  Result Value Ref Range Status   MRSA by PCR NEGATIVE NEGATIVE Final    Comment:        The GeneXpert MRSA Assay (FDA approved for NASAL specimens only), is one component of a comprehensive MRSA colonization surveillance program. It is not intended to diagnose MRSA infection nor to guide or monitor treatment for MRSA infections. Performed at [facility], [place] [address] [place], [place] [address]       Radiology Studies: cxr 1v 5am  Result Date: 09/21/2019 CLINICAL DATA:  Acute respiratory disease due to COVID-19. EXAM: CHEST  1 VIEW COMPARISON:  09/12/2019. FINDINGS: Prior median sternotomy. Cardiomegaly. Diffuse bilateral pulmonary infiltrates/edema again noted. New infiltrate noted in the left upper lung. No  prominent pleural effusion. No pneumothorax. IMPRESSION: 1.  Prior median sternotomy.  Stable cardiomegaly. 2. Diffuse bilateral pulmonary infiltrates/edema again noted. New infiltrate noted the left upper lung. Patient is known COVID-19 positive. Electronically Signed   By: Marcello Moores  Register   On: 09/21/2019 07:06    Scheduled Meds: . vitamin C  500 mg Oral Daily  . aspirin EC  81 mg Oral Daily  . carvedilol  3.125 mg Oral BID WC  . cetirizine  10 mg Oral Daily  . clopidogrel  75 mg Oral Daily  . colchicine  0.6 mg Oral Daily  . dexamethasone (DECADRON) injection  6 mg Intravenous Daily  . dicyclomine  10 mg Oral TID AC  . docusate sodium  100 mg Oral BID  . enoxaparin (LOVENOX) injection  40 mg Subcutaneous Q24H  . folic acid  1 mg Oral Daily  . furosemide  60 mg Intravenous BID  . gabapentin  600 mg Oral TID  . insulin aspart  0-20 Units Subcutaneous TID WC  . insulin aspart  0-5 Units Subcutaneous QHS  . insulin aspart  4 Units Subcutaneous TID WC  . insulin detemir  30 Units Subcutaneous BID  . ipratropium  2 spray Each Nare QID  . isosorbide mononitrate  30 mg Oral Daily  . montelukast  10 mg Oral QHS  . pantoprazole  40 mg Oral Daily  . pravastatin  40 mg Oral Daily  . probenecid  1,000 mg Oral BID  . sodium chloride flush  3 mL Intravenous Q12H  . thiamine  100 mg Oral Daily  . zinc sulfate  220 mg Oral Daily   Continuous Infusions:   LOS: 12 days   Time spent: 35 minutes.  Patrecia Pour, MD [facility] [email or web address] 09/21/2019, 2:57 PM

## 2019-09-21 NOTE — Progress Notes (Signed)
Ambulation Note  Saturation Pre: 90% on 4L  Ambulation Distance: 30 ft  Saturation During Ambulation: 84% on 4L  Notes: Pt used RW. Pt extremely SOB and had to sit after 30 ft. Sats were 84% on 4L while walking and dropped to 79% upon sitting. It took 6 minutes for sats to increase to 90%. Pt in recliner with call bell within reach, on 4L and sats at 91%.  Philis Kendall, MS, ACSM CEP 11:02 AM 09/21/2019

## 2019-09-22 LAB — BASIC METABOLIC PANEL
Anion gap: 14 (ref 5–15)
BUN: 32 mg/dL — ABNORMAL HIGH (ref 8–23)
CO2: 25 mmol/L (ref 22–32)
Calcium: 9.3 mg/dL (ref 8.9–10.3)
Chloride: 93 mmol/L — ABNORMAL LOW (ref 98–111)
Creatinine, Ser: 1.15 mg/dL (ref 0.61–1.24)
GFR calc Af Amer: >60 mL/min (ref >60)
GFR calc non Af Amer: >60 mL/min (ref >60)
Glucose, Bld: 110 mg/dL — ABNORMAL HIGH (ref 70–99)
Potassium: 3.7 mmol/L (ref 3.5–5.1)
Sodium: 132 mmol/L — ABNORMAL LOW (ref 135–145)

## 2019-09-22 LAB — GLUCOSE, CAPILLARY
Glucose-Capillary: 105 mg/dL — ABNORMAL HIGH (ref 70–99)
Glucose-Capillary: 209 mg/dL — ABNORMAL HIGH (ref 70–99)
Glucose-Capillary: 237 mg/dL — ABNORMAL HIGH (ref 70–99)

## 2019-09-22 LAB — C-REACTIVE PROTEIN: CRP: 5.9 mg/dL — ABNORMAL HIGH (ref <1.0)

## 2019-09-22 MED ORDER — INSULIN ASPART 100 UNIT/ML ~~LOC~~ SOLN
6.0000 [IU] | Freq: Three times a day (TID) | SUBCUTANEOUS | Status: DC
  Administered 2019-09-22 – 2019-09-23 (×5): 6 [IU] via SUBCUTANEOUS

## 2019-09-22 NOTE — Progress Notes (Addendum)
PROGRESS NOTE  Chase Cabrera  UMP:[id number] DOB: June 27, 1946 DOA: 09/09/2019 PCP: McLean-Scocuzza, Nino Glow, MD   Brief Narrative: 74 year old with a history of CHF, CAD, Crohn's disease, DM 2, GERD, HTN, gout, and HLD who presented to [facility] 12/22 with acute onset shortness of breath and generalized weakness which had been present for 4 days. In the ED he was found to have a temperature of 102 and saturations of 90% on room air.  He was found to be Covid positive and a CXR noted pulmonary vascular congestion.  He was admitted and treatment was initiated with remdesivir and Decadron. 12/27 he was transferred to [facility].  Significant Events: 12/22 admit to [facility] 12/27 transfer to [facility]  COVID-19 Specific Treatment: Decadron 12/22 > 12/28 Remdesivir 12/22 > 12/26 Actemra 12/24 Restart decadron 1/8 >>   Antimicrobials:  Azithromycin 12/22 Rocephin 12/22  Assessment & Plan: Principal Problem:   Acute respiratory failure with hypoxia [facility]) Active Problems:   Atrial fibrillation (HCC)   DM type 2 with diabetic peripheral neuropathy (HCC)   Hyperlipidemia   Essential hypertension   BPH (benign prostatic hyperplasia)   Pneumonia due to COVID-19 virus   Pressure injury of skin  Acute hypoxic respiratory failure due to covid-19 pneumonia:  - s/p remdesivir 12/22 - 12/26 - Was given decadron x7 days. CRP remains elevated and CXR infiltrates persist with possible increasing LUL. Restarted steroids 1/8 and CRP has risen in the absence of fever or leukocytosis. ?hemoconcentration from diuresis. Will continue steroids. - s/p tocilizumab 12/24.  - Remains hypoxic at rest with decreased functional mobility. - Continue FV, IS, frequent mobilization. Diurese as below. - Vit C, zinc  Hyponatremia:  - Holding aldactone  Hypokalemia: Due to diuresis, resolved  Acute kidney injury: Creatinine 1.04 putative baseline. - Monitor with diuresis  Chronic atrial fibrillation with  bradycardic response:  - Continue beta blocker at reduced dose.  - Not on anticoagulation PTA. Continue plavix and lovenox DVT ppx.   Chronic combined systolic and diastolic CHF, CAD: EF 40-08% via TTE Jan 2020. Typically treated with Lasix 80 mg QOD - baseline weight approximately 86kg per old records - Continue lasix 64m IV BID, BNP elevated and CXR consistent with an element of pulmonary edema on my personal review this morning. Continue this with >2L UOP (still appears to be inconsistently charted) and stable Cr, rising BUN. - Need strict I/O (incompletely charted UOP) and daily weights (discussed with RN on rounds) - Continue coreg, decreased dose due to bradycardia though this is not severe or symptomatic. Continue imdur.  T2DM uncontrolled with peripheral neuropathy and hyperglycemia: CBGs rising after reinitiation of steroids - Continue levemir 30u BID and SSI, increase mealtime insulin 4u > 6u TIDWC and monitor for need to augment further.. Continue HS coverage. AM fasting CBG remains at goal.  - Continue neurontin  HLD:  - Continue statin  GERD, history of Crohn's:  - Continue PPI, bentyl, vitamin supplementation  BPH - Continue usual home medication  DVT prophylaxis: Lovenox Code Status: Full Family Communication: None at bedside. Spoke with Son per pt request yesterday, will call again today.  Disposition Plan: SNF more likely to be necessary with protracted hypoxemia, remains hypoxemic despite 4L O2. Continue frequent ambulation/therapy.    Consultants:   None  Procedures:   None  Antimicrobials:  Remdesivir 12/22 - 12/26  Azithromycin, ceftriaxone 12/22 x1  Subjective: "My lungs feel better than they have in a long time." Shortness of breath is resolved this morning, has been  urinating a lot, denies wheezing but still has trouble with cough when he takes deep breaths. He is still using flutter valve and incentive spirometry and plans to get up to chair  after breakfast.    Objective: Vitals:   09/21/19 1605 09/21/19 1832 09/21/19 1900 09/22/19 0400  BP: 124/69 123/62 133/60 (!) 147/65  Pulse: 63 69 63 60  Resp:  18 20 (!) 21  Temp: (!) 97.4 F (36.3 C) 98.2 F (36.8 C) 98.2 F (36.8 C) (!) 97 F (36.1 C)  TempSrc: Oral Oral Oral Oral  SpO2: 90% 92% 92% 91%  Weight:      Height:        Intake/Output Summary (Last 24 hours) at 09/22/2019 0650 Last data filed at 09/21/2019 2100 Gross per 24 hour  Intake 1570 ml  Output 2025 ml  Net -455 ml   Filed Weights   09/09/19 1821  Weight: 82.1 kg    Gen: 74 y.o. male in no distress eating breakfast in bed. Pulm: Nonlabored breathing supplemental oxygen at rest, crackles remain at bases, no wheezing, some cough with deep inspiration.  CV: Regular rate and rhythm. No murmur, rub, or gallop. No JVD, trace dependent edema. GI: Abdomen soft, non-tender, non-distended, with normoactive bowel sounds.  Ext: Warm, no deformities Skin: No rashes, lesions or ulcers on visualized skin. Neuro: Alert and oriented. No focal neurological deficits. Psych: Judgement and insight appear fair. Mood euthymic & affect congruent. Behavior is appropriate.    Data Reviewed: I have personally reviewed following labs and imaging studies  CBC: Recent Labs  Lab 09/21/19 0042  WBC 7.3  NEUTROABS 4.0  HGB 13.8  HCT 41.0  MCV 103.0*  PLT 540   Basic Metabolic Panel: Recent Labs  Lab 09/16/19 0420 09/18/19 0526 09/21/19 0042 09/22/19 0211  NA 131* 132* 134* 132*  K 4.2 3.9 3.8 3.7  CL 92* 97* 97* 93*  CO2 26 25 24 25   GLUCOSE 110* 85 136* 110*  BUN 33* 23 22 32*  CREATININE 1.18 1.13 1.12 1.15  CALCIUM 9.1 8.9 9.1 9.3   GFR: Estimated Creatinine Clearance: 58 mL/min (by C-G formula based on SCr of 1.15 mg/dL). Liver Function Tests: Recent Labs  Lab 09/18/19 0526 09/21/19 0042  AST 30 29  ALT 28 34  ALKPHOS 53 57  BILITOT 0.4 0.4  PROT 6.5 6.9  ALBUMIN 3.0* 3.1*   No results for  input(s): LIPASE, AMYLASE in the last 168 hours. No results for input(s): AMMONIA in the last 168 hours. Coagulation Profile: No results for input(s): INR, PROTIME in the last 168 hours. Cardiac Enzymes: No results for input(s): CKTOTAL, CKMB, CKMBINDEX, TROPONINI in the last 168 hours. BNP (last 3 results) No results for input(s): PROBNP in the last 8760 hours. HbA1C: No results for input(s): HGBA1C in the last 72 hours. CBG: Recent Labs  Lab 09/20/19 1631 09/20/19 2059 09/21/19 0807 09/21/19 1311 09/21/19 1557  GLUCAP 159* 216* 103* 203* 319*   Lipid Profile: No results for input(s): CHOL, HDL, LDLCALC, TRIG, CHOLHDL, LDLDIRECT in the last 72 hours. Thyroid Function Tests: No results for input(s): TSH, T4TOTAL, FREET4, T3FREE, THYROIDAB in the last 72 hours. Anemia Panel: No results for input(s): VITAMINB12, FOLATE, FERRITIN, TIBC, IRON, RETICCTPCT in the last 72 hours. Urine analysis:    Component Value Date/Time   COLORURINE YELLOW (A) 06/13/2018 1551   APPEARANCEUR CLEAR (A) 06/13/2018 1551   APPEARANCEUR Cloudy (A) 01/31/2018 1321   LABSPEC 1.018 06/13/2018 1551   PHURINE 5.0  06/13/2018 [place] 06/13/2018 [place] 06/13/2018 [place] 06/13/2018 1551   BILIRUBINUR Negative 01/31/2018 1321   [place] 06/13/2018 1551   PROTEINUR NEGATIVE 06/13/2018 1551   NITRITE NEGATIVE 06/13/2018 1551   LEUKOCYTESUR NEGATIVE 06/13/2018 1551   LEUKOCYTESUR Negative 01/31/2018 1321   No results found for this or any previous visit (from the past 240 hour(s)).    Radiology Studies: cxr 1v 5am  Result Date: 09/21/2019 CLINICAL DATA:  Acute respiratory disease due to COVID-19. EXAM: CHEST  1 VIEW COMPARISON:  09/12/2019. FINDINGS: Prior median sternotomy. Cardiomegaly. Diffuse bilateral pulmonary infiltrates/edema again noted. New infiltrate noted in the left upper lung. No prominent pleural effusion. No pneumothorax.  IMPRESSION: 1.  Prior median sternotomy.  Stable cardiomegaly. 2. Diffuse bilateral pulmonary infiltrates/edema again noted. New infiltrate noted the left upper lung. Patient is known COVID-19 positive. Electronically Signed   By: Marcello Moores  Register   On: 09/21/2019 07:06    Scheduled Meds: . vitamin C  500 mg Oral Daily  . aspirin EC  81 mg Oral Daily  . carvedilol  3.125 mg Oral BID WC  . cetirizine  10 mg Oral Daily  . clopidogrel  75 mg Oral Daily  . colchicine  0.6 mg Oral Daily  . dexamethasone (DECADRON) injection  6 mg Intravenous Daily  . dicyclomine  10 mg Oral TID AC  . docusate sodium  100 mg Oral BID  . enoxaparin (LOVENOX) injection  40 mg Subcutaneous Q24H  . folic acid  1 mg Oral Daily  . furosemide  60 mg Intravenous BID  . gabapentin  600 mg Oral TID  . insulin aspart  0-20 Units Subcutaneous TID WC  . insulin aspart  0-5 Units Subcutaneous QHS  . insulin aspart  6 Units Subcutaneous TID WC  . insulin detemir  30 Units Subcutaneous BID  . ipratropium  2 spray Each Nare QID  . isosorbide mononitrate  30 mg Oral Daily  . montelukast  10 mg Oral QHS  . pantoprazole  40 mg Oral Daily  . pravastatin  40 mg Oral Daily  . probenecid  1,000 mg Oral BID  . sodium chloride flush  3 mL Intravenous Q12H  . thiamine  100 mg Oral Daily  . zinc sulfate  220 mg Oral Daily   Continuous Infusions:   LOS: 13 days   Time spent: 35 minutes.  Patrecia Pour, MD [facility] [email or web address] 09/22/2019, 6:50 AM

## 2019-09-22 NOTE — Plan of Care (Signed)
  Problem: Education: Goal: Knowledge of risk factors and measures for prevention of condition will improve Outcome: Progressing   Problem: Respiratory: Goal: Will maintain a patent airway Outcome: Progressing

## 2019-09-22 NOTE — Progress Notes (Signed)
Call placed to the patient's spouse to give up dates but the mailbox is full so unable to leave any message

## 2019-09-23 LAB — BASIC METABOLIC PANEL
Anion gap: 15 (ref 5–15)
BUN: 35 mg/dL — ABNORMAL HIGH (ref 8–23)
CO2: 26 mmol/L (ref 22–32)
Calcium: 9.3 mg/dL (ref 8.9–10.3)
Chloride: 93 mmol/L — ABNORMAL LOW (ref 98–111)
Creatinine, Ser: 1.14 mg/dL (ref 0.61–1.24)
GFR calc Af Amer: >60 mL/min (ref >60)
GFR calc non Af Amer: >60 mL/min (ref >60)
Glucose, Bld: 109 mg/dL — ABNORMAL HIGH (ref 70–99)
Potassium: 3.2 mmol/L — ABNORMAL LOW (ref 3.5–5.1)
Sodium: 134 mmol/L — ABNORMAL LOW (ref 135–145)

## 2019-09-23 LAB — GLUCOSE, CAPILLARY
Glucose-Capillary: 130 mg/dL — ABNORMAL HIGH (ref 70–99)
Glucose-Capillary: 236 mg/dL — ABNORMAL HIGH (ref 70–99)
Glucose-Capillary: 269 mg/dL — ABNORMAL HIGH (ref 70–99)
Glucose-Capillary: 208 mg/dL — ABNORMAL HIGH (ref 70–99)

## 2019-09-23 LAB — C-REACTIVE PROTEIN: CRP: 1.9 mg/dL — ABNORMAL HIGH (ref <1.0)

## 2019-09-23 MED ORDER — INSULIN ASPART 100 UNIT/ML ~~LOC~~ SOLN
8.0000 [IU] | Freq: Three times a day (TID) | SUBCUTANEOUS | Status: DC
  Administered 2019-09-23 – 2019-09-25 (×7): 8 [IU] via SUBCUTANEOUS

## 2019-09-23 MED ORDER — POTASSIUM CHLORIDE CRYS ER 20 MEQ PO TBCR
40.0000 meq | EXTENDED_RELEASE_TABLET | Freq: Two times a day (BID) | ORAL | Status: DC
  Administered 2019-09-23 – 2019-09-25 (×5): 40 meq via ORAL
  Filled 2019-09-23 (×5): qty 2

## 2019-09-23 NOTE — Progress Notes (Signed)
PROGRESS NOTE  Chase Cabrera  AST:[id number] DOB: November 28, 1945 DOA: 09/09/2019 PCP: McLean-Scocuzza, Nino Glow, MD   Brief Narrative: 74 year old with a history of CHF, CAD, Crohn's disease, DM 2, GERD, HTN, gout, and HLD who presented to [facility] 12/22 with acute onset shortness of breath and generalized weakness which had been present for 4 days. In the ED he was found to have a temperature of 102 and saturations of 90% on room air.  He was found to be Covid positive and a CXR noted pulmonary vascular congestion.  He was admitted and treatment was initiated with remdesivir and Decadron. 12/27 he was transferred to [facility].  Significant Events: 12/22 admit to [facility] 12/27 transfer to [facility]  COVID-19 Specific Treatment: Decadron 12/22 > 12/28 Remdesivir 12/22 > 12/26 Actemra 12/24 Restart decadron 1/8 >>   Antimicrobials:  Azithromycin 12/22 Rocephin 12/22  Assessment & Plan: Principal Problem:   Acute respiratory failure with hypoxia [facility]) Active Problems:   Atrial fibrillation (HCC)   DM type 2 with diabetic peripheral neuropathy (HCC)   Hyperlipidemia   Essential hypertension   BPH (benign prostatic hyperplasia)   Pneumonia due to COVID-19 virus  Acute hypoxic respiratory failure due to covid-19 pneumonia:  - s/p remdesivir 12/22 - 12/26 - Was given decadron x7 days. CRP remained elevated and CXR infiltrates persisted with possible increasing LUL so restarted steroids 1/8. - s/p tocilizumab 12/24.  - Remains hypoxic at rest with decreased functional mobility. - Continue FV, IS, frequent mobilization. Diurese as below. - Vit C, zinc  Hyponatremia:  - Holding aldactone  Hypokalemia: Due to diuresis.  - Supplement again today and monitor while diuresing  Acute kidney injury: Creatinine 1.04 putative baseline. Cr stable currently with BUN rising consistent with volume contraction. - Monitor with diuresis  Chronic atrial fibrillation with bradycardic response:  -  Continue beta blocker at reduced dose.  - Not on anticoagulation PTA. Continue plavix and lovenox DVT ppx.   Chronic combined systolic and diastolic CHF, CAD: EF 98-92% via TTE Jan 2020. Typically treated with Lasix 80 mg QOD - baseline weight approximately 86kg per old records - Continue lasix 33m IV BID, BNP elevated and CXR consistent with an element of pulmonary edema. Will again continue this with >2L UOP, improving symptoms, and stable Cr, rising BUN. - Need strict I/O (incompletely charted UOP) and daily weights (down 2kg from admission) - Continue coreg, decreased dose due to bradycardia though this is not severe or symptomatic. Continue imdur.  T2DM uncontrolled with peripheral neuropathy and hyperglycemia: CBGs rising after reinitiation of steroids - Continue levemir 30u BID and SSI, increase mealtime insulin again 6u > 8u TIDWC and monitor for need to augment further. Continue HS coverage. - Continue neurontin  HLD:  - Continue statin  GERD, history of Crohn's:  - Continue PPI, bentyl, vitamin supplementation  BPH - Continue usual home medication  DVT prophylaxis: Lovenox Code Status: Full Family Communication: None at bedside. Speaking with Son by phone regularly. Disposition Plan: SNF more likely to be necessary with protracted hypoxemia, remains hypoxemic despite 4L O2. Continue frequent ambulation/therapy.    Consultants:   None  Procedures:   None  Antimicrobials:  Remdesivir 12/22 - 12/26  Azithromycin, ceftriaxone 12/22 x1  Subjective: States he feels better breathing than he has in months. No chest pain or dyspnea at rest, less dyspnea on exertion. Getting up to chair. Had significant UOP again yesterday.    Objective: Vitals:   09/22/19 1536 09/22/19 2005 09/23/19 0304 09/23/19 01194  BP: 117/65 102/80 (!) 144/65 (!) 168/71  Pulse: 60 64 60 (!) 55  Resp: 20 16 18 16   Temp: 98.7 F (37.1 C) 98.5 F (36.9 C) 97.9 F (36.6 C) 97.7 F (36.5 C)   TempSrc: Oral Oral Oral Oral  SpO2: 93% 91% 98% 90%  Weight:      Height:        Intake/Output Summary (Last 24 hours) at 09/23/2019 1105 Last data filed at 09/23/2019 1000 Gross per 24 hour  Intake 960 ml  Output 2300 ml  Net -1340 ml   Filed Weights   09/09/19 1821 09/22/19 0808  Weight: 82.1 kg 80.7 kg   Gen: 74 y.o. male in no distress Pulm: Nonlabored breathing supplemental oxygen, still with crackles at bases. No wheezing. CV: Regular rate and rhythm. No murmur, rub, or gallop. No JVD, no significant pitting dependent edema. GI: Abdomen soft, non-tender, non-distended, with normoactive bowel sounds.  Ext: Warm, no deformities Skin: No rashes, lesions or ulcers on visualized skin. Neuro: Alert and oriented. No focal neurological deficits. Psych: Judgement and insight appear fair. Mood euthymic & affect congruent. Behavior is appropriate.    Data Reviewed: I have personally reviewed following labs and imaging studies  CBC: Recent Labs  Lab 09/21/19 0042  WBC 7.3  NEUTROABS 4.0  HGB 13.8  HCT 41.0  MCV 103.0*  PLT 462   Basic Metabolic Panel: Recent Labs  Lab 09/18/19 0526 09/21/19 0042 09/22/19 0211 09/23/19 0715  NA 132* 134* 132* 134*  K 3.9 3.8 3.7 3.2*  CL 97* 97* 93* 93*  CO2 25 24 25 26   GLUCOSE 85 136* 110* 109*  BUN 23 22 32* 35*  CREATININE 1.13 1.12 1.15 1.14  CALCIUM 8.9 9.1 9.3 9.3   GFR: Estimated Creatinine Clearance: 54.2 mL/min (by C-G formula based on SCr of 1.14 mg/dL). Liver Function Tests: Recent Labs  Lab 09/18/19 0526 09/21/19 0042  AST 30 29  ALT 28 34  ALKPHOS 53 57  BILITOT 0.4 0.4  PROT 6.5 6.9  ALBUMIN 3.0* 3.1*   No results for input(s): LIPASE, AMYLASE in the last 168 hours. No results for input(s): AMMONIA in the last 168 hours. Coagulation Profile: No results for input(s): INR, PROTIME in the last 168 hours. Cardiac Enzymes: No results for input(s): CKTOTAL, CKMB, CKMBINDEX, TROPONINI in the last 168 hours.  BNP (last 3 results) No results for input(s): PROBNP in the last 8760 hours. HbA1C: No results for input(s): HGBA1C in the last 72 hours. CBG: Recent Labs  Lab 09/21/19 1557 09/22/19 0806 09/22/19 1155 09/22/19 1613 09/23/19 0742  GLUCAP 319* 105* 209* 237* 130*   Lipid Profile: No results for input(s): CHOL, HDL, LDLCALC, TRIG, CHOLHDL, LDLDIRECT in the last 72 hours. Thyroid Function Tests: No results for input(s): TSH, T4TOTAL, FREET4, T3FREE, THYROIDAB in the last 72 hours. Anemia Panel: No results for input(s): VITAMINB12, FOLATE, FERRITIN, TIBC, IRON, RETICCTPCT in the last 72 hours. Urine analysis:    Component Value Date/Time   COLORURINE YELLOW (A) 06/13/2018 1551   APPEARANCEUR CLEAR (A) 06/13/2018 1551   APPEARANCEUR Cloudy (A) 01/31/2018 1321   LABSPEC 1.018 06/13/2018 1551   PHURINE 5.0 06/13/2018 [place] 06/13/2018 1551   HGBUR NEGATIVE 06/13/2018 [place] 06/13/2018 1551   BILIRUBINUR Negative 01/31/2018 1321   KETONESUR NEGATIVE 06/13/2018 1551   PROTEINUR NEGATIVE 06/13/2018 1551   NITRITE NEGATIVE 06/13/2018 [place] 06/13/2018 1551   LEUKOCYTESUR Negative 01/31/2018 1321  No results found for this or any previous visit (from the past 240 hour(s)).    Radiology Studies: No results found.  Scheduled Meds: . vitamin C  500 mg Oral Daily  . aspirin EC  81 mg Oral Daily  . carvedilol  3.125 mg Oral BID WC  . cetirizine  10 mg Oral Daily  . clopidogrel  75 mg Oral Daily  . colchicine  0.6 mg Oral Daily  . dexamethasone (DECADRON) injection  6 mg Intravenous Daily  . dicyclomine  10 mg Oral TID AC  . docusate sodium  100 mg Oral BID  . enoxaparin (LOVENOX) injection  40 mg Subcutaneous Q24H  . folic acid  1 mg Oral Daily  . furosemide  60 mg Intravenous BID  . gabapentin  600 mg Oral TID  . insulin aspart  0-20 Units Subcutaneous TID WC  . insulin aspart  0-5 Units Subcutaneous QHS  .  insulin aspart  6 Units Subcutaneous TID WC  . insulin detemir  30 Units Subcutaneous BID  . ipratropium  2 spray Each Nare QID  . isosorbide mononitrate  30 mg Oral Daily  . montelukast  10 mg Oral QHS  . pantoprazole  40 mg Oral Daily  . pravastatin  40 mg Oral Daily  . probenecid  1,000 mg Oral BID  . sodium chloride flush  3 mL Intravenous Q12H  . thiamine  100 mg Oral Daily  . zinc sulfate  220 mg Oral Daily   Continuous Infusions:   LOS: 14 days   Time spent: 35 minutes.  Patrecia Pour, MD [facility] [email or web address] 09/23/2019, 11:05 AM

## 2019-09-23 NOTE — Progress Notes (Signed)
Call placed to the patients wife with updates

## 2019-09-23 NOTE — Progress Notes (Signed)
Patient Sao2 90%-92% sitting in the chair at rest on 2 liters oxygen.  Patient ambulated in room with 2 liters oxygen and Sao2 decreased to 84%. Recovered sitting and resating with 2 liters oxygen to 90 %

## 2019-09-24 LAB — MAGNESIUM: Magnesium: 2.5 mg/dL — ABNORMAL HIGH (ref 1.7–2.4)

## 2019-09-24 LAB — BASIC METABOLIC PANEL
Anion gap: 14 (ref 5–15)
BUN: 40 mg/dL — ABNORMAL HIGH (ref 8–23)
CO2: 26 mmol/L (ref 22–32)
Calcium: 9.6 mg/dL (ref 8.9–10.3)
Chloride: 94 mmol/L — ABNORMAL LOW (ref 98–111)
Creatinine, Ser: 1.44 mg/dL — ABNORMAL HIGH (ref 0.61–1.24)
GFR calc Af Amer: 55 mL/min — ABNORMAL LOW (ref >60)
GFR calc non Af Amer: 47 mL/min — ABNORMAL LOW (ref >60)
Glucose, Bld: 91 mg/dL (ref 70–99)
Potassium: 4.2 mmol/L (ref 3.5–5.1)
Sodium: 134 mmol/L — ABNORMAL LOW (ref 135–145)

## 2019-09-24 LAB — GLUCOSE, CAPILLARY
Glucose-Capillary: 293 mg/dL — ABNORMAL HIGH (ref 70–99)
Glucose-Capillary: 189 mg/dL — ABNORMAL HIGH (ref 70–99)
Glucose-Capillary: 102 mg/dL — ABNORMAL HIGH (ref 70–99)
Glucose-Capillary: 165 mg/dL — ABNORMAL HIGH (ref 70–99)
Glucose-Capillary: 246 mg/dL — ABNORMAL HIGH (ref 70–99)
Glucose-Capillary: 248 mg/dL — ABNORMAL HIGH (ref 70–99)

## 2019-09-24 LAB — C-REACTIVE PROTEIN: CRP: 4.8 mg/dL — ABNORMAL HIGH (ref <1.0)

## 2019-09-24 MED ORDER — FUROSEMIDE 20 MG PO TABS
80.0000 mg | ORAL_TABLET | Freq: Every day | ORAL | Status: DC
  Administered 2019-09-24 – 2019-09-25 (×2): 80 mg via ORAL
  Filled 2019-09-24 (×2): qty 4

## 2019-09-24 MED ORDER — CLONIDINE HCL 0.1 MG PO TABS
0.2000 mg | ORAL_TABLET | Freq: Two times a day (BID) | ORAL | Status: DC
  Administered 2019-09-24 – 2019-09-25 (×3): 0.2 mg via ORAL
  Filled 2019-09-24 (×3): qty 2

## 2019-09-24 NOTE — Progress Notes (Signed)
Occupational Therapy Treatment Patient Details Name: Chase Cabrera MRN: [id number] DOB: [birth year] Today's Date: 09/24/2019    History of present illness 74 year old with a history of CHF, CAD, Crohn's disease, DM 2, GERD, HTN, gout, and HLD who presented to [facility] 12/22 and was admitted  with acute onset shortness of breath and generalized weakness , temperature of 102 and saturations of 90% on room air.  He was found to be Covid positive and a CXR noted pulmonary vascular congestion.  12/27 he was transferred to [facility].   OT comments  Pt making steady progress. Completed ADL at sink level using rollator as seat on 3L with SpO2 83 and 2/4 DOE. Pt required several rest breaks and mod vc on pursed lip breathing. After bathing, able to complete toilet transfer and  [facility], then ambulate back to recliner with improved DOE. Pt states he is feeling better. Feel pt will be appropriate to DC home with [facility] when medically appropriate. Will continue to follow acutely. Recommend pt ambulate with nsg staff.   Follow Up Recommendations  Home health OT;Supervision/Assistance - 24 hour    Equipment Recommendations  None recommended by OT    Recommendations for Other Services      Precautions / Restrictions Precautions Precautions: Fall Precaution Comments: O2 desats       Mobility Bed Mobility Overal bed mobility: Needs Assistance       Supine to sit: Supervision        Transfers Overall transfer level: Needs assistance   Transfers: Sit to/from Stand;Stand Pivot Transfers Sit to Stand: Supervision Stand pivot transfers: Supervision            Balance Overall balance assessment: Needs assistance   Sitting balance-Leahy Scale: Good       Standing balance-Leahy Scale: Fair                             ADL either performed or assessed with clinical judgement   ADL Overall ADL's : Needs assistance/impaired     Grooming: Standing;Supervision/safety    Upper Body Bathing: Set up;Sitting   Lower Body Bathing: Min guard;Sit to/from stand Lower Body Bathing Details (indicate cue type and reason): used rollator in front of sink     Lower Body Dressing: Minimal assistance;Sit to/from stand   Toilet Transfer: Min guard;Ambulation(rollator)   Toileting- Water quality scientist and Hygiene: Supervision/safety       Functional mobility during ADLs: Min guard;Cueing for safety(rollator)       Vision       Perception     Praxis      Cognition Arousal/Alertness: Awake/alert Behavior During Therapy: WFL for tasks assessed/performed Overall Cognitive Status: No family/caregiver present to determine baseline cognitive functioning                                 General Comments: most likely baseline        Exercises Other Exercises Other Exercises: flutter valve x 10 Other Exercises: incentive spirometer x 10 Other Exercises: Education on pursed lip breathing   Shoulder Instructions       General Comments      Pertinent Vitals/ Pain       Pain Assessment: No/denies pain  Home Living  Prior Functioning/Environment              Frequency  Min 3X/week        Progress Toward Goals  OT Goals(current goals can now be found in the care plan section)  Progress towards OT goals: Progressing toward goals  Acute Rehab OT Goals Patient Stated Goal: to go home Time For Goal Achievement: 09/27/19 Potential to Achieve Goals: Good ADL Goals Pt Will Perform Grooming: with supervision;standing Pt Will Perform Lower Body Bathing: with supervision;sitting/lateral leans;sit to/from stand Pt Will Perform Lower Body Dressing: sitting/lateral leans;sit to/from stand;with supervision Pt Will Transfer to Toilet: with min assist;bedside commode Pt Will Perform Toileting - Clothing Manipulation and hygiene: with min assist;sit to/from stand;sitting/lateral  leans Additional ADL Goal #1: Pt to demonstrate breathing exercises with 0 verbal cues. Additional ADL Goal #2: Pt to stand up to 5 min with min guard, in preparation for ADLs.  Plan Discharge plan remains appropriate;Frequency needs to be updated    Co-evaluation                 AM-PAC OT "6 Clicks" Daily Activity     Outcome Measure   Help from another person eating meals?: None Help from another person taking care of personal grooming?: A Little Help from another person toileting, which includes using toliet, bedpan, or urinal?: A Little Help from another person bathing (including washing, rinsing, drying)?: A Little Help from another person to put on and taking off regular upper body clothing?: A Little Help from another person to put on and taking off regular lower body clothing?: A Little 6 Click Score: 19    End of Session Equipment Utilized During Treatment: Oxygen(3L; rollator)  OT Visit Diagnosis: Unsteadiness on feet (R26.81);Muscle weakness (generalized) (M62.81)   Activity Tolerance Patient tolerated treatment well   Patient Left in chair;with call bell/phone within reach   Nurse Communication Mobility status        Time: 1130(1130)-1204 OT Time Calculation (min): 34 min  Charges: OT General Charges $OT Visit: 1 Visit OT Treatments $Self Care/Home Management : 23-37 mins  Maurie Boettcher, OT/L   Acute OT Clinical Specialist [place] Pager [phone] Office [phone]    [facility] 09/24/2019, 1:53 PM

## 2019-09-24 NOTE — Plan of Care (Signed)
BP (!) 146/59 (BP Location: Right Arm)   Pulse 68   Temp 98.7 F (37.1 C) (Oral)   Resp (!) 22   Ht 5' 8"  (1.727 m)   Wt 80.7 kg   SpO2 90%   BMI 27.05 kg/m    POC reviewed with patient, self-repositions, ambulation with a walker.

## 2019-09-24 NOTE — Progress Notes (Signed)
PROGRESS NOTE  Chase Cabrera  TWS:[id number] DOB: 08-10-1946 DOA: 09/09/2019 PCP: McLean-Scocuzza, Nino Glow, MD   Brief Narrative: 74 year old with a history of CHF, CAD, Crohn's disease, DM 2, GERD, HTN, gout, and HLD who presented to [facility] 12/22 with acute onset shortness of breath and generalized weakness which had been present for 4 days. In the ED he was found to have a temperature of 102 and saturations of 90% on room air.  He was found to be Covid positive and a CXR noted pulmonary vascular congestion.  He was admitted and treatment was initiated with remdesivir and Decadron. 12/27 he was transferred to [facility].  Significant Events: 12/22 admit to [facility] 12/27 transfer to [facility]  COVID-19 Specific Treatment: Decadron 12/22 > 12/28 Remdesivir 12/22 > 12/26 Actemra 12/24 Restart decadron 1/8 >>   Antimicrobials:  Azithromycin 12/22 Rocephin 12/22  Assessment & Plan: Principal Problem:   Acute respiratory failure with hypoxia [facility]) Active Problems:   Atrial fibrillation (HCC)   DM type 2 with diabetic peripheral neuropathy (HCC)   Hyperlipidemia   Essential hypertension   BPH (benign prostatic hyperplasia)   Pneumonia due to COVID-19 virus  Acute hypoxic respiratory failure due to covid-19 pneumonia:  - s/p remdesivir 12/22 - 12/26 - Was given decadron x7 days. CRP remained elevated and CXR infiltrates persisted with possible increasing LUL so restarted steroids 1/8. CRP initially responded but has risen again. Will continue steroids and monitoring. - s/p tocilizumab 12/24.  - Remains hypoxic at rest with decreased functional mobility. - Continue FV, IS, frequent mobilization. Diurese as below. - Vit C, zinc  Hyponatremia:  - Holding aldactone  Hypokalemia: Due to diuresis.  - Supplemented with improvement.   Acute kidney injury: Creatinine 1.04 putative baseline. Cr stable currently with BUN rising consistent with volume contraction. - Monitor with  diuresis  Chronic atrial fibrillation with bradycardic response:  - Continue beta blocker at reduced dose.  - Not on anticoagulation PTA. Continue plavix and lovenox DVT ppx.   Chronic combined systolic and diastolic CHF, CAD, HTN: EF 45-50% via TTE Jan 2020. Typically treated with Lasix 80 mg QOD - baseline weight approximately 86kg per old records - Need strict I/O (incompletely charted UOP) and daily weights (down 2kg from admission) - Continue coreg, decreased dose due to bradycardia though this is not severe or symptomatic. Continue imdur. - Reorder home clonidine for rising BPs.  - Cr up and UOP leveled off, weight stable, so return to home lasix, EDW prob 177lbs.    T2DM uncontrolled with peripheral neuropathy and hyperglycemia: CBGs rising after reinitiation of steroids - Continue levemir 30u BID and SSI, increased mealtime insulin again 6u > 8u TIDWC and monitor for need to augment further. Continue HS coverage. Fasting CBG at goal. - Continue neurontin  HLD:  - Continue statin  GERD, history of Crohn's:  - Continue PPI, bentyl, vitamin supplementation  BPH - Continue usual home medication  DVT prophylaxis: Lovenox Code Status: Full Family Communication: None at bedside. Wife by phone again today. Disposition Plan: Making gains in functional status over the previous days. OT feels he would be stable for home, which the patient agrees with. Will ask for repeat evaluation tomorrow and if stable, could go home with home health and supplemental oxygen tomorrow.  Consultants:   None  Procedures:   None  Antimicrobials:  Remdesivir 12/22 - 12/26  Azithromycin, ceftriaxone 12/22 x1  Subjective: Getting up frequently w/assistance PT/OT. Shortness of breath is stable from yesterday, worse with  exertion, waxing/waning, moderate for the most part, associated with very mild intermittent cough.  Objective: Vitals:   09/23/19 1932 09/24/19 0433 09/24/19 0435 09/24/19  0800  BP: (!) 146/59 (!) 141/73  (!) 154/64  Pulse: 68 (!) 54  (!) 57  Resp: (!) 22 20  20   Temp: 98.7 F (37.1 C) 97.7 F (36.5 C)  97.9 F (36.6 C)  TempSrc: Oral Oral  Oral  SpO2: 90% 90%  (!) 89%  Weight:   80.5 kg   Height:        Intake/Output Summary (Last 24 hours) at 09/24/2019 1202 Last data filed at 09/24/2019 0800 Gross per 24 hour  Intake 600 ml  Output 1150 ml  Net -550 ml   Filed Weights   09/09/19 1821 09/22/19 0808 09/24/19 0435  Weight: 82.1 kg 80.7 kg 80.5 kg   Gen: 74 y.o. male in no distress Pulm: Nonlabored breathing supplemental oxygen. Crackles stable CV: Regular rate and rhythm. No murmur, rub, or gallop. No JVD, no dependent edema. GI: Abdomen soft, non-tender, non-distended, with normoactive bowel sounds.  Ext: Warm, no deformities Skin: No rashes, lesions or ulcers on visualized skin. Neuro: Alert and oriented. No focal neurological deficits. Psych: Judgement and insight appear fair. Mood euthymic & affect congruent. Behavior is appropriate.     Data Reviewed: I have personally reviewed following labs and imaging studies  CBC: Recent Labs  Lab 09/21/19 0042  WBC 7.3  NEUTROABS 4.0  HGB 13.8  HCT 41.0  MCV 103.0*  PLT 681   Basic Metabolic Panel: Recent Labs  Lab 09/18/19 0526 09/21/19 0042 09/22/19 0211 09/23/19 0715 09/24/19 0245  NA 132* 134* 132* 134* 134*  K 3.9 3.8 3.7 3.2* 4.2  CL 97* 97* 93* 93* 94*  CO2 25 24 25 26 26   GLUCOSE 85 136* 110* 109* 91  BUN 23 22 32* 35* 40*  CREATININE 1.13 1.12 1.15 1.14 1.44*  CALCIUM 8.9 9.1 9.3 9.3 9.6  MG  --   --   --   --  2.5*   GFR: Estimated Creatinine Clearance: 42.9 mL/min (A) (by C-G formula based on SCr of 1.44 mg/dL (H)). Liver Function Tests: Recent Labs  Lab 09/18/19 0526 09/21/19 0042  AST 30 29  ALT 28 34  ALKPHOS 53 57  BILITOT 0.4 0.4  PROT 6.5 6.9  ALBUMIN 3.0* 3.1*   No results for input(s): LIPASE, AMYLASE in the last 168 hours. No results for  input(s): AMMONIA in the last 168 hours. Coagulation Profile: No results for input(s): INR, PROTIME in the last 168 hours. Cardiac Enzymes: No results for input(s): CKTOTAL, CKMB, CKMBINDEX, TROPONINI in the last 168 hours. BNP (last 3 results) No results for input(s): PROBNP in the last 8760 hours. HbA1C: No results for input(s): HGBA1C in the last 72 hours. CBG: Recent Labs  Lab 09/23/19 1154 09/23/19 1633 09/23/19 1927 09/24/19 0749 09/24/19 1146  GLUCAP 236* 269* 208* 102* 165*   Lipid Profile: No results for input(s): CHOL, HDL, LDLCALC, TRIG, CHOLHDL, LDLDIRECT in the last 72 hours. Thyroid Function Tests: No results for input(s): TSH, T4TOTAL, FREET4, T3FREE, THYROIDAB in the last 72 hours. Anemia Panel: No results for input(s): VITAMINB12, FOLATE, FERRITIN, TIBC, IRON, RETICCTPCT in the last 72 hours. Urine analysis:    Component Value Date/Time   COLORURINE YELLOW (A) 06/13/2018 1551   APPEARANCEUR CLEAR (A) 06/13/2018 1551   APPEARANCEUR Cloudy (A) 01/31/2018 1321   LABSPEC 1.018 06/13/2018 1551   PHURINE 5.0 06/13/2018 1551  GLUCOSEU NEGATIVE 06/13/2018 [place] 06/13/2018 [place] 06/13/2018 1551   BILIRUBINUR Negative 01/31/2018 [place] 06/13/2018 1551   PROTEINUR NEGATIVE 06/13/2018 1551   NITRITE NEGATIVE 06/13/2018 1551   LEUKOCYTESUR NEGATIVE 06/13/2018 1551   LEUKOCYTESUR Negative 01/31/2018 1321   No results found for this or any previous visit (from the past 240 hour(s)).    Radiology Studies: No results found.  Scheduled Meds: . vitamin C  500 mg Oral Daily  . aspirin EC  81 mg Oral Daily  . carvedilol  3.125 mg Oral BID WC  . cetirizine  10 mg Oral Daily  . cloNIDine  0.2 mg Oral BID  . clopidogrel  75 mg Oral Daily  . colchicine  0.6 mg Oral Daily  . dexamethasone (DECADRON) injection  6 mg Intravenous Daily  . dicyclomine  10 mg Oral TID AC  . docusate sodium  100 mg Oral BID  .  enoxaparin (LOVENOX) injection  40 mg Subcutaneous Q24H  . folic acid  1 mg Oral Daily  . furosemide  80 mg Oral Daily  . gabapentin  600 mg Oral TID  . insulin aspart  0-20 Units Subcutaneous TID WC  . insulin aspart  0-5 Units Subcutaneous QHS  . insulin aspart  8 Units Subcutaneous TID WC  . insulin detemir  30 Units Subcutaneous BID  . ipratropium  2 spray Each Nare QID  . isosorbide mononitrate  30 mg Oral Daily  . montelukast  10 mg Oral QHS  . pantoprazole  40 mg Oral Daily  . potassium chloride  40 mEq Oral BID  . pravastatin  40 mg Oral Daily  . probenecid  1,000 mg Oral BID  . sodium chloride flush  3 mL Intravenous Q12H  . thiamine  100 mg Oral Daily  . zinc sulfate  220 mg Oral Daily   Continuous Infusions:   LOS: 15 days   Time spent: 25 minutes.  Patrecia Pour, MD [facility] [email or web address] 09/24/2019, 12:02 PM

## 2019-09-24 NOTE — Plan of Care (Signed)
POC reviewed with patient, up with assist,  HOH.  Lab Results  Component Value Date   POCGLU 241 (A) 10/27/2017    bowel movement 1/10    Vital Signs MEWS/VS Documentation       09/23/2019 1358 09/23/2019 1508 09/23/2019 1611 09/23/2019 1932   MEWS Score:  1  1  1  1    MEWS Score Color:  [name]  [name]  Green  Green   Resp:  (!) 22  (!) 22  --  (!) 22   Pulse:  --  65  --  68   BP:  --  126/68  --  (!) 146/59   Temp:  --  98.4 F (36.9 C)  --  98.7 F (37.1 C)   O2 Device:  Nasal Cannula  Nasal Cannula  --  Nasal Cannula   O2 Flow Rate (L/min):  2 L/min  2 L/min  --  3 L/min   Level of Consciousness:  Alert  Alert  Alert  Alert            Chase Cabrera 09/24/2019,12:21 AM

## 2019-09-25 LAB — BASIC METABOLIC PANEL
Anion gap: 14 (ref 5–15)
BUN: 37 mg/dL — ABNORMAL HIGH (ref 8–23)
CO2: 24 mmol/L (ref 22–32)
Calcium: 9.3 mg/dL (ref 8.9–10.3)
Chloride: 96 mmol/L — ABNORMAL LOW (ref 98–111)
Creatinine, Ser: 1.07 mg/dL (ref 0.61–1.24)
GFR calc Af Amer: >60 mL/min (ref >60)
GFR calc non Af Amer: >60 mL/min (ref >60)
Glucose, Bld: 134 mg/dL — ABNORMAL HIGH (ref 70–99)
Potassium: 3.9 mmol/L (ref 3.5–5.1)
Sodium: 134 mmol/L — ABNORMAL LOW (ref 135–145)

## 2019-09-25 LAB — C-REACTIVE PROTEIN: CRP: 1.3 mg/dL — ABNORMAL HIGH (ref <1.0)

## 2019-09-25 LAB — GLUCOSE, CAPILLARY
Glucose-Capillary: 123 mg/dL — ABNORMAL HIGH (ref 70–99)
Glucose-Capillary: 178 mg/dL — ABNORMAL HIGH (ref 70–99)
Glucose-Capillary: 302 mg/dL — ABNORMAL HIGH (ref 70–99)

## 2019-09-25 MED ORDER — FUROSEMIDE 40 MG PO TABS: 80.0000 mg | ORAL_TABLET | ORAL | Status: AC

## 2019-09-25 MED ORDER — DEXAMETHASONE 1 MG PO TABS: ORAL_TABLET | ORAL | 0 refills | Status: AC

## 2019-09-25 MED ORDER — SPIRONOLACTONE 25 MG PO TABS: 12.5000 mg | ORAL_TABLET | Freq: Every day | ORAL | 11 refills | Status: AC

## 2019-09-25 NOTE — Discharge Summary (Signed)
Physician Discharge Summary  Chase Cabrera QMG:[id number] DOB: 1946-07-25 DOA: 09/09/2019  PCP: McLean-Scocuzza, Nino Glow, MD  Admit date: 09/09/2019 Discharge date: 09/25/2019  Admitted From: Home Disposition: Home   Recommendations for Outpatient Follow-up:  1. Follow up with PCP in 1-2 weeks 2. Please obtain CMP/CBC in one week  Home Health: PT, OT  Equipment/Devices: 3L O2 Discharge Condition: Stable, improved CODE STATUS: Full Diet recommendation: Heart healthy, carb-modified  Brief/Interim Summary: 74 year old with a history of CHF, CAD, Crohn's disease, DM 2, GERD, HTN, gout, and HLD who presented to [facility] 12/22 with acute onset shortness of breath and generalized weakness which had been present for 4 days.In the ED he was found to have a temperature of 102 and saturations of 90% on room air. He was found to be Covid positive and a CXR noted pulmonary vascular congestion. He was admitted and treatment was initiated with remdesivir and Decadron. 12/27 he was transferred to [facility].  COVID-19 Specific Treatment: Decadron 12/22 > 12/28 Remdesivir 12/22 > 12/26 Actemra 12/24 Restarted decadron 1/8 >> Plan to complete 1/23.   Antimicrobials:  Azithromycin 12/22 Rocephin 12/22  Discharge Diagnoses:  Principal Problem:   Acute respiratory failure with hypoxia [facility]) Active Problems:   Atrial fibrillation (HCC)   DM type 2 with diabetic peripheral neuropathy (HCC)   Hyperlipidemia   Essential hypertension   BPH (benign prostatic hyperplasia)   Pneumonia due to COVID-19 virus  Acute hypoxic respiratory failure due to covid-19 pneumonia:  - s/p remdesivir 12/22 - 12/26 - Was given decadron x7 days. CRP remained elevated and CXR infiltrates persisted with possible increasing LUL so restarted steroids 1/8. CRP has responded and hypoxia has stabilized. Plan to continue taper as outpatient (see below) - s/p tocilizumab 12/24.  - Remains hypoxic at rest though  functionally making significant gains and is felt by PT, OT, patient, and attending MD to be stable for home discharge. - Continue FV, IS, frequent mobilization. Diurese as below. - Vit C, zinc - Leukocytosis has resolved despite steroids, LUL opacity noted on most recent CXR, and has remained afebrile not felt to be compatible with bacterial component. Recommend recheck CXR.   Hyponatremia: Mild. No interventions planned. - Recheck at follow up  Hypokalemia: Due to diuresis and resolved with replacement.  - Recheck at follow up  Acute kidney injury: Creatinine 1.04 putative baseline, worsened with diuresis but has returned to baseline at discharge. Current CrCl indicates the patient has stage IIIa CKD.  - Monitor at follow up  Chronic atrial fibrillation with bradycardic response:  - Continue beta blocker, if bradycardic, would recommend decreasing dose. - Not on anticoagulation PTA. Continue plavix   Chronic combined systolic and diastolic CHF, CAD, HTN: EF 45-50% via TTE Jan 2020. Typically treated with Lasix 80 mg QOD - baseline weight approximately 86kg per old records - Continue coreg, imdur and antiHTN medications. - Return to home lasix, EDW felt to be 177lbs.    T2DM uncontrolled with peripheral neuropathy and hyperglycemia: CBGs rising after reinitiation of steroids - Continue home insulin regimen, anticipate improved control with tapering of steroids. Will need close PCP follow up.  - Continue neurontin  HLD:  - Continue statin  GERD, history of Crohn's:  - Continue PPI, bentyl, vitamin supplementation  BPH - Continue usual home medication  Discharge Instructions Discharge Instructions    Diet - low sodium heart healthy   Complete by: As directed    Discharge instructions   Complete by: As directed  You are being discharged from the hospital after treatment for covid-19 infection. You are felt to be stable enough to no longer require inpatient monitoring,  testing, and treatment, though you will need to follow the recommendations below: - Continue taking decadron (steroid) taper over next 10 days as directed. This was sent to your pharmacy - Per CDC guidelines, you will need to remain in isolation for 21 days from your first positive covid test. - Do not take NSAID medications (including, but not limited to, ibuprofen, advil, motrin, naproxen, aleve, goody's powder, etc.) - Follow up with your doctor in the next week via telehealth or seek medical attention right away if your symptoms get WORSE.  - Consider donating plasma after you have recovered (either 14 days after a negative test or 28 days after symptoms have completely resolved) because your antibodies to this virus may be helpful to give to others with life-threatening infections. Please go to the website www.[email or web address] if you would like to consider volunteering for plasma donation.    Directions for you at home:  Wear a facemask You should wear a facemask that covers your nose and mouth when you are in the same room with other people and when you visit a healthcare provider. People who live with or visit you should also wear a facemask while they are in the same room with you.  Separate yourself from other people in your home As much as possible, you should stay in a different room from other people in your home. Also, you should use a separate bathroom, if available.  Avoid sharing household items You should not share dishes, drinking glasses, cups, eating utensils, towels, bedding, or other items with other people in your home. After using these items, you should wash them thoroughly with soap and water.  Cover your coughs and sneezes Cover your mouth and nose with a tissue when you cough or sneeze, or you can cough or sneeze into your sleeve. Throw used tissues in a lined trash can, and immediately wash your hands with soap and water for at least 20 seconds or use  an alcohol-based hand rub.  Wash your [facility] your hands often and thoroughly with soap and water for at least 20 seconds. You can use an alcohol-based hand sanitizer if soap and water are not available and if your hands are not visibly dirty. Avoid touching your eyes, nose, and mouth with unwashed hands.  Directions for those who live with, or provide care at home for you:  Limit the number of people who have contact with the patient If possible, have only one caregiver for the patient. Other household members should stay in another home or place of residence. If this is not possible, they should stay in another room, or be separated from the patient as much as possible. Use a separate bathroom, if available. Restrict visitors who do not have an essential need to be in the home.  Ensure good ventilation Make sure that shared spaces in the home have good air flow, such as from an air conditioner or an opened window, weather permitting.  Wash your hands often Wash your hands often and thoroughly with soap and water for at least 20 seconds. You can use an alcohol based hand sanitizer if soap and water are not available and if your hands are not visibly dirty. Avoid touching your eyes, nose, and mouth with unwashed hands. Use disposable paper towels to dry your hands. If not available, use dedicated cloth  towels and replace them when they become wet.  Wear a facemask and gloves Wear a disposable facemask at all times in the room and gloves when you touch or have contact with the patient's blood, body fluids, and/or secretions or excretions, such as sweat, saliva, sputum, nasal mucus, vomit, urine, or feces.  Ensure the mask fits over your nose and mouth tightly, and do not touch it during use. Throw out disposable facemasks and gloves after using them. Do not reuse. Wash your hands immediately after removing your facemask and gloves. If your personal clothing becomes contaminated,  carefully remove clothing and launder. Wash your hands after handling contaminated clothing. Place all used disposable facemasks, gloves, and other waste in a lined container before disposing them with other household waste. Remove gloves and wash your hands immediately after handling these items.  Do not share dishes, glasses, or other household items with the patient Avoid sharing household items. You should not share dishes, drinking glasses, cups, eating utensils, towels, bedding, or other items with a patient who is confirmed to have, or being evaluated for, COVID-19 infection. After the person uses these items, you should wash them thoroughly with soap and water.  Wash laundry thoroughly Immediately remove and wash clothes or bedding that have blood, body fluids, and/or secretions or excretions, such as sweat, saliva, sputum, nasal mucus, vomit, urine, or feces, on them. Wear gloves when handling laundry from the patient. Read and follow directions on labels of laundry or clothing items and detergent. In general, wash and dry with the warmest temperatures recommended on the label.  Clean all areas the individual has used often Clean all touchable surfaces, such as counters, tabletops, doorknobs, bathroom fixtures, toilets, phones, keyboards, tablets, and bedside tables, every day. Also, clean any surfaces that may have blood, body fluids, and/or secretions or excretions on them. Wear gloves when cleaning surfaces the patient has come in contact with. Use a diluted bleach solution (e.g., dilute bleach with 1 part bleach and 10 parts water) or a household disinfectant with a label that says EPA-registered for coronaviruses. To make a bleach solution at home, add 1 tablespoon of bleach to 1 quart (4 cups) of water. For a larger supply, add  cup of bleach to 1 gallon (16 cups) of water. Read labels of cleaning products and follow recommendations provided on product labels. Labels contain  instructions for safe and effective use of the cleaning product including precautions you should take when applying the product, such as wearing gloves or eye protection and making sure you have good ventilation during use of the product. Remove gloves and wash hands immediately after cleaning.  Monitor yourself for signs and symptoms of illness Caregivers and household members are considered close contacts, should monitor their health, and will be asked to limit movement outside of the home to the extent possible. Follow the monitoring steps for close contacts listed on the symptom monitoring form.  If you have additional questions, contact your local health department or call the epidemiologist on call at [phone] (available 24/7). This guidance is subject to change. For the most up-to-date guidance from [facility], please refer to their website: YouBlogs.pl   Increase activity slowly   Complete by: As directed    MyChart COVID-19 home monitoring program   Complete by: Sep 25, 2019    Is the patient willing to use the [place] for home monitoring?: Yes     Allergies as of 09/25/2019      Reactions  Allopurinol Diarrhea, Other (See Comments), Nausea And Vomiting   Other Reaction: GI Upset   Atenolol Other (See Comments)   Other Reaction: bradycardia   Atorvastatin Other (See Comments)   Other Reaction: muscle aches   Ramipril Rash   Rosuvastatin Other (See Comments)   Muscle aches   Simvastatin Other (See Comments)   Other Reaction: MUSCLE ACHES (Zocor)   Valsartan Other (See Comments)   Other Reaction: facial swelling   Cardizem [diltiazem] Rash      Medication List    TAKE these medications   amitriptyline 50 MG tablet Commonly known as: ELAVIL Take 0.5 tablets (25 mg total) by mouth at bedtime as needed.   amLODipine 5 MG tablet Commonly known as: NORVASC Take 1 tablet (5 mg total) by mouth 2 (two)  times daily. If blood pressure >140/>90 make take another 1 pill of 5 mg   aspirin 81 MG EC tablet Take 1 tablet (81 mg total) by mouth daily.   Bentyl 10 MG capsule Generic drug: dicyclomine Take 10 mg by mouth 3 (three) times daily before meals.   carvedilol 25 MG tablet Commonly known as: COREG Take 1 tablet (25 mg total) by mouth 2 (two) times daily with a meal.   Cholecalciferol 1.25 MG (50000 UT) Tabs Take 1 tablet by mouth once a week.   cloNIDine 0.2 MG tablet Commonly known as: CATAPRES Take 1 tablet (0.2 mg total) by mouth 2 (two) times daily.   clopidogrel 75 MG tablet Commonly known as: PLAVIX Take 75 mg by mouth daily.   colchicine 0.6 MG tablet TAKE 1 TABLET BY MOUTH ONCE DAILY   dexamethasone 1 MG tablet Commonly known as: DECADRON Take 4 tablets (4 mg total) by mouth daily for 2 days, THEN 2 tablets (2 mg total) daily for 2 days, THEN 1 tablet (1 mg total) daily for 2 days, THEN 0.5 tablets (0.5 mg total) daily for 4 days. Start taking on: September 25, 2019   diclofenac sodium 1 % Gel Commonly known as: VOLTAREN Apply 2 g topically 4 (four) times daily as needed (for pain).   fluticasone 50 MCG/ACT nasal spray Commonly known as: FLONASE Place 2 sprays into both nostrils daily.   furosemide 40 MG tablet Commonly known as: LASIX Take 2 tablets (80 mg total) by mouth every other day.   gabapentin 300 MG capsule Commonly known as: NEURONTIN take 2 capsules (672m) by mouth three times a day   hydrALAZINE 100 MG tablet Commonly known as: APRESOLINE TAKE 100 MG BY MOUTH THREE TIMES A DAY   insulin lispro 100 UNIT/ML KiwkPen Commonly known as: HUMALOG Inject 14-32 Units into the skin 3 (three) times daily. (according to sliding scale - max 96u over 24 hours)   ipratropium 0.06 % nasal spray Commonly known as: Atrovent Place 2 sprays into both nostrils 4 (four) times daily.   isosorbide mononitrate 120 MG 24 hr tablet Commonly known as: IMDUR TAKE  1 TABLET BY MOUTH EVERY DAY   levocetirizine 5 MG tablet Commonly known as: Xyzal Take 1 tablet (5 mg total) by mouth every evening.   montelukast 10 MG tablet Commonly known as: SINGULAIR Take 1 tablet (10 mg total) by mouth at bedtime.   Multi-Vitamins Tabs Take 1 tablet by mouth daily.   nitroGLYCERIN 0.4 MG SL tablet Commonly known as: NITROSTAT Place 1 tablet (0.4 mg total) under the tongue every 5 (five) minutes x 3 doses as needed. For chest pain.  Call 911 if no relief after  3 tablets   Olopatadine HCl 0.6 % Soln Place 2 sprays into the nose 2 (two) times daily.   omega-3 acid ethyl esters 1 g capsule Commonly known as: LOVAZA Take 2 capsules (2 g total) by mouth 2 (two) times daily.   omeprazole 40 MG capsule Commonly known as: PRILOSEC Take 40 mg by mouth 2 (two) times daily.   pravastatin 40 MG tablet Commonly known as: PRAVACHOL Take 1 tablet (40 mg total) by mouth daily.   probenecid 500 MG tablet Commonly known as: BENEMID Take 1,000 mg by mouth 2 (two) times daily.   sodium chloride HYPERTONIC 3 % nebulizer solution Take by nebulization 2 (two) times daily.   spironolactone 25 MG tablet Commonly known as: ALDACTONE Take 0.5 tablets (12.5 mg total) by mouth daily.   Tyler Aas FlexTouch 200 UNIT/ML Sopn Generic drug: Insulin Degludec Inject 90 Units into the skin at bedtime.   vitamin C 500 MG tablet Commonly known as: ASCORBIC ACID Take 500 mg by mouth daily.   Vitamin E 400 units Tabs Take 400 Units by mouth 2 (two) times daily.            Durable Medical Equipment  (From admission, onward)         Start     Ordered   09/24/19 1419  For home use only DME oxygen  Once    Question Answer Comment  Length of Need 6 Months   Mode or (Route) Nasal cannula   Liters per Minute 3   Frequency Continuous (stationary and portable oxygen unit needed)   Oxygen delivery system Gas      09/24/19 1419         Follow-up Information     McLean-Scocuzza, Nino Glow, MD. Schedule an appointment as soon as possible for a visit in 1 week(s).   Specialty: Internal Medicine Contact information: [place] [id number] [place], [place] Follow up.   Why: Someone will call you to schedule first appointment  Contact information: [place] [phone]          Allergies  Allergen Reactions  . Allopurinol Diarrhea, Other (See Comments) and Nausea And Vomiting    Other Reaction: GI Upset  . Atenolol Other (See Comments)    Other Reaction: bradycardia  . Atorvastatin Other (See Comments)    Other Reaction: muscle aches  . Ramipril Rash  . Rosuvastatin Other (See Comments)    Muscle aches  . Simvastatin Other (See Comments)    Other Reaction: MUSCLE ACHES (Zocor)  . Valsartan Other (See Comments)    Other Reaction: facial swelling  . Cardizem [Diltiazem] Rash    Consultations:  None  Procedures/Studies: cxr 1v 5am  Result Date: 09/21/2019 CLINICAL DATA:  Acute respiratory disease due to COVID-19. EXAM: CHEST  1 VIEW COMPARISON:  09/12/2019. FINDINGS: Prior median sternotomy. Cardiomegaly. Diffuse bilateral pulmonary infiltrates/edema again noted. New infiltrate noted in the left upper lung. No prominent pleural effusion. No pneumothorax. IMPRESSION: 1.  Prior median sternotomy.  Stable cardiomegaly. 2. Diffuse bilateral pulmonary infiltrates/edema again noted. New infiltrate noted the left upper lung. Patient is known COVID-19 positive. Electronically Signed   By: Marcello Moores  Register   On: 09/21/2019 07:06   DG Chest Port 1 View  Result Date: 09/12/2019 CLINICAL DATA:  COVID-19 pneumonia. EXAM: PORTABLE CHEST 1 VIEW COMPARISON:  09/04/2019 FINDINGS: Patient rotated right. Prior median sternotomy. Midline trachea. Moderate cardiomegaly.  No pleural effusion or pneumothorax. Bibasilar interstitial opacities, new since the prior.  IMPRESSION: Bibasilar interstitial opacities, consistent with COVID-19 pneumonia. Electronically Signed   By: Abigail Miyamoto M.D.   On: 09/12/2019 08:42   DG Chest Portable 1 View  Result Date: 09/04/2019 CLINICAL DATA:  Shortness of breath and productive cough. EXAM: PORTABLE CHEST 1 VIEW COMPARISON:  Single-view of the chest 06/24/2019 and 09/16/2018. FINDINGS: There is cardiomegaly and pulmonary vascular congestion. No consolidative process, pneumothorax or effusion. No acute or focal bony abnormality. IMPRESSION: Cardiomegaly and pulmonary vascular congestion. Electronically Signed   By: Inge Rise M.D.   On: 09/04/2019 19:05      Subjective: Feels well, wants to go home. No chest pain or leg swelling/tenderness.  Discharge Exam: Vitals:   09/25/19 0357 09/25/19 0754  BP: (!) 132/48 133/63  Pulse: (!) 44 (!) 48  Resp: 17 16  Temp: 97.8 F (36.6 C) 97.6 F (36.4 C)  SpO2: 96% 95%   General: Pt is alert, awake, not in acute distress Cardiovascular: RRR, S1/S2 +, no rubs, no gallops Respiratory: Nonlabored, minimal diffuse crackles without wheezing. Abdominal: Soft, NT, ND, bowel sounds + Extremities: No significant edema, no cyanosis  Labs: BNP (last 3 results) Recent Labs    09/09/19 2100 09/10/19 0423 09/21/19 0018  BNP 91.3 122.8* 196.2*   Basic Metabolic Panel: Recent Labs  Lab 09/21/19 0042 09/22/19 0211 09/23/19 0715 09/24/19 0245 09/25/19 0445  NA 134* 132* 134* 134* 134*  K 3.8 3.7 3.2* 4.2 3.9  CL 97* 93* 93* 94* 96*  CO2 24 25 26 26 24   GLUCOSE 136* 110* 109* 91 134*  BUN 22 32* 35* 40* 37*  CREATININE 1.12 1.15 1.14 1.44* 1.07  CALCIUM 9.1 9.3 9.3 9.6 9.3  MG  --   --   --  2.5*  --    Liver Function Tests: Recent Labs  Lab 09/21/19 0042  AST 29  ALT 34  ALKPHOS 57  BILITOT 0.4  PROT 6.9  ALBUMIN 3.1*   CBC: Recent Labs  Lab 09/21/19 0042  WBC 7.3  NEUTROABS 4.0  HGB 13.8  HCT 41.0  MCV 103.0*  PLT 189   CBG: Recent Labs   Lab 09/24/19 1146 09/24/19 1632 09/24/19 2031 09/25/19 0733 09/25/19 1145  GLUCAP 165* 246* 248* 123* 178*   Time coordinating discharge: Approximately 40 minutes  Patrecia Pour, MD  [facility] 09/25/2019, 1:08 PM

## 2019-09-25 NOTE — Progress Notes (Signed)
Physical Therapy Treatment Patient Details Name: Chase Cabrera MRN: [id number] DOB: August 21, 1946 Today's Date: 09/25/2019    History of Present Illness 74 year old with a history of CHF, CAD, Crohn's disease, DM 2, GERD, HTN, gout, and HLD who presented to [facility] 12/22 and was admitted  with acute onset shortness of breath and generalized weakness , temperature of 102 and saturations of 90% on room air.  He was found to be Covid positive and a CXR noted pulmonary vascular congestion.  12/27 he was transferred to [facility].    PT Comments     the patient is much improved in mobility. Ambulated on 2 L SPO2 85%(fingr) x 80' 75% on RA for ~ 20'. Patient is hopeful for Dc to home to day.  When ear to finger sats  compared, ear probe reads 5 higher than finger.  Patient reports that he has O2 and a pulse ox at home.  Follow Up Recommendations  Home health PT;Supervision/Assistance - 24 hour     Equipment Recommendations    none   Recommendations for Other Services       Precautions / Restrictions Precautions Precautions: Fall Precaution Comments: O2 desats    Mobility  Bed Mobility   Bed Mobility: Supine to Sit     Supine to sit: Supervision        Transfers   Equipment used: Rolling walker (2 wheeled) Transfers: Sit to/from Stand Sit to Stand: Supervision         General transfer comment: no assistance  Ambulation/Gait Ambulation/Gait assistance: Supervision Gait Distance (Feet): 80 Feet Assistive device: Rolling walker (2 wheeled) Gait Pattern/deviations: Trunk flexed;Step-through pattern   Gait velocity interpretation: <1.31 ft/sec, indicative of household ambulator General Gait Details: patient manuevred in room with multiple turns, pt on 2 L Sneads   Stairs             Wheelchair Mobility    Modified Rankin (Stroke Patients Only)       Balance     Sitting balance-Leahy Scale: Good     Standing balance support: Bilateral upper extremity  supported;During functional activity Standing balance-Leahy Scale: Fair Standing balance comment: Pt required UE support for balance during static standing task.                            Cognition Arousal/Alertness: Awake/alert Behavior During Therapy: WFL for tasks assessed/performed Overall Cognitive Status: Within Functional Limits for tasks assessed                                        Exercises      General Comments        Pertinent Vitals/Pain Pain Assessment: No/denies pain    Home Living                      Prior Function            PT Goals (current goals can now be found in the care plan section) Progress towards PT goals: Progressing toward goals    Frequency    Min 3X/week      PT Plan Current plan remains appropriate    Co-evaluation              AM-PAC PT "6 Clicks" Mobility   Outcome Measure  Help needed turning from your back to your side while  in a flat bed without using bedrails?: None Help needed moving from lying on your back to sitting on the side of a flat bed without using bedrails?: None Help needed moving to and from a bed to a chair (including a wheelchair)?: A Little Help needed standing up from a chair using your arms (e.g., wheelchair or bedside chair)?: A Little Help needed to walk in hospital room?: A Little Help needed climbing 3-5 steps with a railing? : A Lot 6 Click Score: 19    End of Session Equipment Utilized During Treatment: Oxygen;Gait belt Activity Tolerance: Patient tolerated treatment well Patient left: in chair;with call bell/phone within reach;with chair alarm set Nurse Communication: Mobility status PT Visit Diagnosis: Other abnormalities of gait and mobility (R26.89);Muscle weakness (generalized) (M62.81);Difficulty in walking, not elsewhere classified (R26.2)     Time: 1062-6948 PT Time Calculation (min) (ACUTE ONLY): 39 min  Charges:  $Gait Training: 23-37  mins $Self Care/Home Management: [place] Pager [phone] Office [phone]    Claretha Cooper 09/25/2019, 12:55 PM

## 2019-09-25 NOTE — Progress Notes (Signed)
Mr. Skarzynski's wife has called and was very rude. I tried to explain that we do not have orders for discharge yet and that it is a process. She is stating that we are "not doing our job" and wants to know where her grandson can come park and wait since he is coming at 12noon. she was very beligerent and would not stop being rude. Message passed on to Dr. Bonner Puna and LCSW.

## 2019-09-25 NOTE — TOC Transition Note (Addendum)
Transition of Care [facility]) - CM/SW Discharge Note   Patient Details  Name: Chase Cabrera MRN: [id number] Date of Birth: April 18, 1946  Transition of Care [facility]) CM/SW Contact:  Atilano Median, LCSW Phone Number: 09/25/2019, 1:02 PM   Clinical Narrative:     Update 2:10pm Patient's wife stated that she would prefer to have [facility] over [facility]. Referral made and accepted by Tiffany at 2:10pm. Patient's wife Eula aware and agreeable to this plan.   Oxygen also delivered to the room prior to discharge. Magda Paganini with [facility] will have concentrator delivered to the patient's home.   Discharged home with home health services. Patient aware and agreeable to this plan. AVS updated. No other needs at this time. Case closed to this CSW.     Barriers to Discharge: Continued Medical Work up   Patient Goals and CMS Choice        Discharge Placement                       Discharge Plan and Services                          HH Arranged: PT, OT [facility] Agency: [place] ([facility]) Date HH Agency Contacted: 09/25/19 Time [place]: [place] Representative spoke with at [place]: [place] ([facility]) Interventions     Readmission Risk Interventions Readmission Risk Prevention Plan 09/10/2019 09/05/2019 09/05/2019  Transportation Screening - Complete Complete  Medication Review Press photographer) - Complete -  PCP or Specialist appointment within 3-5 days of discharge - Complete -  [place] or Home Care Consult Complete - -  SW Recovery Care/Counseling Consult - Complete -  [place] Not Applicable - -  Some recent data might be hidden

## 2019-09-25 NOTE — Care Management Important Message (Signed)
Important Message  Patient Details  Name: Chase Cabrera MRN: [id number] Date of Birth: 19-Mar-1946   Medicare Important Message Given:  Yes - Important Message mailed due to current National Emergency  Verbal consent obtained due to current National Emergency  Relationship to patient: Spouse/Significant Other Contact Name: Tanis Burnley Call Date: 09/25/19  Time: 1342 Phone: [phone] Outcome: Spoke with contact Important Message mailed to: Patient address on file(Temporary address ([address]) on file)    Delorse Lek 09/25/2019, 1:43 PM

## 2019-09-26 ENCOUNTER — Telehealth: Payer: Self-pay

## 2019-09-26 NOTE — Telephone Encounter (Signed)
Transition Care Management Follow-up Telephone Call  Date of discharge and from where: 09/25/19 [facility]  How have you been since you were released from the hospital? Appetite is good, 3L O2 in use, BM/voiding without issues. Resting.   Any questions or concerns? Awaiting Home Health PT/OT. [place] on discharge; patient prefers [facility].   Items Reviewed:  Did the pt receive and understand the discharge instructions provided? Yes, increase O2 as needed. Increase activity slowly.   Medications obtained and verified? Yes, no issues.   Any new allergies since your discharge? No.   Dietary orders reviewed? Yes, low sodium, heart healthy.   Do you have support at home? Yes, wife.  Functional Questionnaire: (I = Independent and D = Dependent) ADLs: I  Bathing/Dressing- I  Meal Prep- Wife assist  Eating- I  Maintaining continence- Dependent/pull-up brief  Transferring/Ambulation- Engineer, materials Meds- Wife manages  Follow up appointments reviewed:   PCP Hospital f/u appt confirmed?  Scheduled to see Dr. Olivia Mackie McLean-Scocuzza 10/02/19 @ 1130 via doxy [phone].  [facility] f/u appt confirmed?  None   Are transportation arrangements needed? No  If their condition worsens, is the pt aware to call PCP or go to the Emergency Dept.? Yes  Was the patient provided with contact information for the PCP's office or ED? Yes  Was to pt encouraged to call back with questions or concerns? Yes

## 2019-09-28 ENCOUNTER — Telehealth: Payer: Self-pay | Admitting: Internal Medicine

## 2019-09-28 NOTE — Telephone Encounter (Signed)
Chase Cabrera [phone] with Home health called needing Verbal order for PT 1 time a week for 1 week  3 times a week  for 1week  2 times a week for 5 weeks  1 time a week for 1 week

## 2019-09-28 NOTE — Telephone Encounter (Signed)
Verbal order has been [facility]ven

## 2019-10-02 ENCOUNTER — Other Ambulatory Visit: Payer: Self-pay

## 2019-10-02 ENCOUNTER — Ambulatory Visit (INDEPENDENT_AMBULATORY_CARE_PROVIDER_SITE_OTHER): Payer: Medicare Other | Admitting: Internal Medicine

## 2019-10-02 ENCOUNTER — Ambulatory Visit: Payer: Medicare Other

## 2019-10-02 VITALS — Ht 68.0 in | Wt 167.0 lb

## 2019-10-02 DIAGNOSIS — U071 COVID-19: Secondary | ICD-10-CM | POA: Diagnosis not present

## 2019-10-02 DIAGNOSIS — E441 Mild protein-calorie malnutrition: Secondary | ICD-10-CM | POA: Diagnosis not present

## 2019-10-02 DIAGNOSIS — M79605 Pain in left leg: Secondary | ICD-10-CM

## 2019-10-02 DIAGNOSIS — E1142 Type 2 diabetes mellitus with diabetic polyneuropathy: Secondary | ICD-10-CM

## 2019-10-02 DIAGNOSIS — R634 Abnormal weight loss: Secondary | ICD-10-CM

## 2019-10-02 DIAGNOSIS — R197 Diarrhea, unspecified: Secondary | ICD-10-CM

## 2019-10-02 DIAGNOSIS — D72829 Elevated white blood cell count, unspecified: Secondary | ICD-10-CM

## 2019-10-02 DIAGNOSIS — G629 Polyneuropathy, unspecified: Secondary | ICD-10-CM

## 2019-10-02 DIAGNOSIS — M79604 Pain in right leg: Secondary | ICD-10-CM

## 2019-10-02 MED ORDER — ALPHA-LIPOIC ACID 600 MG PO CAPS: 1.0000 | ORAL_CAPSULE | Freq: Two times a day (BID) | ORAL | 3 refills | Status: AC

## 2019-10-02 MED ORDER — GABAPENTIN 300 MG PO CAPS: ORAL_CAPSULE | ORAL | 3 refills | Status: AC

## 2019-10-02 NOTE — Progress Notes (Signed)
telephone Note  I connected with Chase Cabrera  on 10/02/19 at 12:06 PAM EST by a video enabled telemedicine application and verified that I am speaking with the correct person using two identifiers.  Location patient: home Location provider:work or home office Persons participating in the virtual visit: patient, provider, wife [name]  I discussed the limitations of evaluation and management by telemedicine and the availability of in person appointments. The patient expressed understanding and agreed to proceed.   HPI: HFU in [facility] ~3 weeks 12/27-1/12/21 due to covid and acute respiratory failure currently on 3L O2 and per pt O2 sats 92-93% and he feels 50% better now than he did when 1st home he reports still reduced appetite and lack of taste and down 30 lbs and slightly low energy and weakness. He thinks he contracted covid from 8 y.o grandson who lives in the home  Of note Mrs. Karge also my pt had fall down 5 steps and having 9/10 right sided back and low back pain getting worse and going to the ED today    ROS: See pertinent positives and negatives per HPI.  Past Medical History:  Diagnosis Date  . Arthritis   . Atrial fibrillation ([place])   . CAD (coronary artery disease)    6 STENTS  . CHF (congestive heart failure) ([place])   . Chicken pox   . Colon polyp   . Corneal dystrophy    bilateral  . COVID-19    09/09/19   . Crohn's disease ([place])   . Diabetes ([place]) 2002  . Dysrhythmia    A-FIB AND PAF  . GERD (gastroesophageal reflux disease)   . Gout   . History of kidney stones   . History of shingles   . Hyperlipemia   . Hypertension    2/2 b/l RAS >60% [place] 06/2018 s/p right stent   . Kidney stones   . Myocardial infarction ([place])    x4, last one in 2010  . Peripheral neuropathy   . Peripheral neuropathy   . Peripheral neuropathy   . PUD (peptic ulcer disease)   . Rectus sheath hematoma    02/18/18 8.9 x 4.5 cm then 02/19/18 7.9 x 4.5 cm   . Renal artery  stenosis ([place])   . Renal artery stenosis ([place])   . Salzmann's nodular dystrophy 2012  . Sleep apnea   . Spinal stenosis     Past Surgical History:  Procedure Laterality Date  . ABLATION  2012  . APPLICATION VERTERBRAL DEFECT PROSTHETIC  05/01/2013  . ARTHRODESIS ANTERIOR LUMBAR SPINE  05/01/2013  . BACK SURGERY     lumbar fusion  . CARDIAC CATHETERIZATION    . CARDIAC ELECTROPHYSIOLOGY STUDY AND ABLATION    . CARDIAC SURGERY    . CARDIOVERSION    . CHOLECYSTECTOMY    . COLONOSCOPY WITH PROPOFOL N/A 04/09/2016   Completed for chronic diarrhea.  Few scattered diverticuli.  No mucosal lesions.  Surgeon: Lollie Sails, MD;  Location: [facility] ENDOSCOPY;  Service: Endoscopy;  Laterality: N/A;  . CORNEAL EYE SURGERY Bilateral 07/28/11    09/22/2011  . CORONARY ANGIOPLASTY    . CORONARY ANGIOPLASTY WITH STENT PLACEMENT  03/28/2015   Distal 80% to normal Stent, Dilation Balloon  . CORONARY ARTERY BYPASS GRAFT     4 VESSELS  . CORONARY/GRAFT ACUTE MI REVASCULARIZATION N/A 09/16/2018   Procedure: Coronary/Graft Acute MI Revascularization;  Surgeon: Isaias Cowman, MD;  Location: [facility] CV LAB;  Service: Cardiovascular;  Laterality: N/A;  .  EYE SURGERY     bilateral cataract 2017 Dr. Megan Mans  . foot and ankle repair Right 2005  . FRACTURE SURGERY     ANKLE PLATE AND SCREWS  . GALLBLADER    . JOINT REPLACEMENT     left total hip 11/11/15  . JOINT REPLACEMENT     right total hip 12/2017 Dr. Rudene Christians   . KNEE ARTHROSCOPY Right   . LEFT HEART CATH AND CORONARY ANGIOGRAPHY N/A 09/16/2018   Procedure: LEFT HEART CATH AND CORONARY ANGIOGRAPHY;  Surgeon: Isaias Cowman, MD;  Location: [place] CV LAB;  Service: Cardiovascular;  Laterality: N/A;  . LUMBAR SPINE FUSION ONE LEVEL  05/03/2013  . RENAL ARTERY STENT Right   . ROTATOR CUFF REPAIR Right 2006  . TONSILLECTOMY    . TOTAL HIP ARTHROPLASTY Left 11/11/2015   Procedure: TOTAL HIP ARTHROPLASTY ANTERIOR APPROACH;  Surgeon:  Hessie Knows, MD;  Location: [facility] ORS;  Service: Orthopedics;  Laterality: Left;  . TOTAL HIP ARTHROPLASTY Right 12/13/2017   Procedure: TOTAL HIP ARTHROPLASTY ANTERIOR APPROACH;  Surgeon: Hessie Knows, MD;  Location: [facility] ORS;  Service: Orthopedics;  Laterality: Right;  . TRANSCATH PLACEMENT INTRAVASCULAR STENT LEG  03/2015    Family History  Problem Relation Age of Onset  . CVA Father   . Stroke Father   . Lung cancer Mother   . Arthritis Mother   . Stomach cancer Sister   . Colon cancer Sister   . Diabetes Brother     SOCIAL HX:  Lives at home with wife and grandson   Current Outpatient Medications:  .  amitriptyline (ELAVIL) 50 MG tablet, Take 1 tablet (50 mg total) by mouth at bedtime as needed., Disp: , Rfl:  .  amLODipine (NORVASC) 5 MG tablet, Take 1 tablet (5 mg total) by mouth 2 (two) times daily. If blood pressure >140/>90 make take another 1 pill of 5 mg, Disp: 180 tablet, Rfl: 3 .  aspirin EC 81 MG EC tablet, Take 1 tablet (81 mg total) by mouth daily., Disp: 30 tablet, Rfl: 0 .  carvedilol (COREG) 25 MG tablet, Take 1 tablet (25 mg total) by mouth 2 (two) times daily with a meal., Disp: 180 tablet, Rfl: 3 .  Cholecalciferol 1.25 MG (50000 UT) TABS, Take 1 tablet by mouth once a week., Disp: 13 tablet, Rfl: 1 .  cloNIDine (CATAPRES) 0.2 MG tablet, Take 1 tablet (0.2 mg total) by mouth 2 (two) times daily., Disp: 180 tablet, Rfl: 3 .  clopidogrel (PLAVIX) 75 MG tablet, Take 75 mg by mouth daily., Disp: , Rfl:  .  colchicine 0.6 MG tablet, TAKE 1 TABLET BY MOUTH ONCE DAILY (Patient taking differently: Take 0.6 mg by mouth daily. ), Disp: 60 tablet, Rfl: 0 .  dexamethasone (DECADRON) 1 MG tablet, Take 4 tablets (4 mg total) by mouth daily for 2 days, THEN 2 tablets (2 mg total) daily for 2 days, THEN 1 tablet (1 mg total) daily for 2 days, THEN 0.5 tablets (0.5 mg total) daily for 4 days., Disp: 16 tablet, Rfl: 0 .  diclofenac sodium (VOLTAREN) 1 % GEL, Apply 2 g topically 4  (four) times daily as needed (for pain). , Disp: , Rfl:  .  dicyclomine (BENTYL) 10 MG capsule, Take 10 mg by mouth 3 (three) times daily before meals. , Disp: , Rfl:  .  fluticasone (FLONASE) 50 MCG/ACT nasal spray, Place 2 sprays into both nostrils daily., Disp: 16 g, Rfl: 11 .  furosemide (LASIX) 40 MG tablet,  Take 2 tablets (80 mg total) by mouth every other day., Disp: 30 tablet, Rfl:  .  gabapentin (NEURONTIN) 300 MG capsule, take 2 capsules (660m) by mouth three times a day, Disp: 180 capsule, Rfl: 3 .  hydrALAZINE (APRESOLINE) 100 MG tablet, TAKE 100 MG BY MOUTH THREE TIMES A DAY, Disp: 270 tablet, Rfl: 3 .  insulin lispro (HUMALOG) 100 UNIT/ML KiwkPen, Inject 14-32 Units into the skin 3 (three) times daily. (according to sliding scale - max 96u over 24 hours), Disp: , Rfl:  .  ipratropium (ATROVENT) 0.06 % nasal spray, Place 2 sprays into both nostrils 4 (four) times daily., Disp: 15 mL, Rfl: 12 .  isosorbide mononitrate (IMDUR) 120 MG 24 hr tablet, TAKE 1 TABLET BY MOUTH EVERY DAY (Patient taking differently: Take 120 mg by mouth daily. ), Disp: 90 tablet, Rfl: 1 .  levocetirizine (XYZAL) 5 MG tablet, Take 1 tablet (5 mg total) by mouth every evening., Disp: , Rfl:  .  montelukast (SINGULAIR) 10 MG tablet, Take 1 tablet (10 mg total) by mouth at bedtime., Disp: 90 tablet, Rfl: 3 .  Multiple Vitamin (MULTI-VITAMINS) TABS, Take 1 tablet by mouth daily., Disp: , Rfl:  .  nitroGLYCERIN (NITROSTAT) 0.4 MG SL tablet, Place 1 tablet (0.4 mg total) under the tongue every 5 (five) minutes x 3 doses as needed. For chest pain.  Call 911 if no relief after 3 tablets, Disp: 30 tablet, Rfl: 11 .  Olopatadine HCl 0.6 % SOLN, Place 2 sprays into the nose 2 (two) times daily., Disp: 30.5 g, Rfl: 6 .  omega-3 acid ethyl esters (LOVAZA) 1 g capsule, Take 2 capsules (2 g total) by mouth 2 (two) times daily., Disp: 360 capsule, Rfl: 3 .  omeprazole (PRILOSEC) 40 MG capsule, Take 40 mg by mouth 2 (two) times  daily. , Disp: , Rfl:  .  pravastatin (PRAVACHOL) 40 MG tablet, Take 1 tablet (40 mg total) by mouth daily., Disp: 90 tablet, Rfl: 3 .  probenecid (BENEMID) 500 MG tablet, Take 1,000 mg by mouth 2 (two) times daily., Disp: , Rfl:  .  sodium chloride HYPERTONIC 3 % nebulizer solution, Take by nebulization 2 (two) times daily., Disp: 300 mL, Rfl: 11 .  spironolactone (ALDACTONE) 25 MG tablet, Take 0.5 tablets (12.5 mg total) by mouth daily., Disp: 15 tablet, Rfl: 11 .  TRESIBA FLEXTOUCH 200 UNIT/ML SOPN, Inject 90 Units into the skin at bedtime., Disp: , Rfl: 11 .  vitamin C (ASCORBIC ACID) 500 MG tablet, Take 500 mg by mouth daily. , Disp: , Rfl:  .  Vitamin E 400 units TABS, Take 400 Units by mouth 2 (two) times daily., Disp: , Rfl:  .  Alpha-Lipoic Acid 600 MG CAPS, Take 1 capsule (600 mg total) by mouth 2 (two) times daily., Disp: 180 capsule, Rfl: 3  EXAM:  VITALS per patient if applicable:  GENERAL: alert, oriented, appears well and in no acute distress  HEENT: atraumatic, conjunttiva clear, no obvious abnormalities on inspection of external nose and ears  NECK: normal movements of the head and neck  LUNGS: on inspection no signs of respiratory distress, breathing rate appears normal, no obvious gross SOB, gasping or wheezing  CV: no obvious cyanosis  MS: moves all visible extremities without noticeable abnormality  PSYCH/NEURO: pleasant and cooperative, no obvious depression or anxiety, speech and thought processing grossly intact  ASSESSMENT AND PLAN:  Discussed the following assessment and plan:  COVID-19 hosp 12/27-1/12/21 Feeling better still on 3L O2 with  sats 92-93% Will check cmp, cbc 1st feb currently pt in quarantine, he will go to hospital   DM type 2 with diabetic peripheral neuropathy A1C 7.1 09/11/19- Plan: gabapentin (NEURONTIN) 300 MG capsule, Alpha-Lipoic Acid 600 MG CAPS, amitriptyline (ELAVIL) 50 MG tablet Cont meds and f/u [facility]   Neuropathy -  Plan: amitriptyline (ELAVIL) 50 MG tablet, add alpha lipoic acid  Cont gabapentin and make sure taking pm dose wife reports at times he misses this Do arterial doppler 10/2019 to r/o PAD with h/o CAD  Weight loss -down 30 lbs since in the hospital rec pirq, ensure max protein or premier protein   HM Flu shot utd pna 23 had 02/2014 UTD prevnar Disc Tdap and shingrix in future   Vitamin D low sent 50K 1x per week 07/12/19 should still be on this until 12/2019 reassess for need  PSAhad nl 07/12/19 0.75 consider DRE in future Colonoscopy had 04/2017 [facility] GI[facility]/o crohns on mesalamine 2.4 mg qd-negative colonoscopy with multiple bxs Consider repeat DEXA reviewed DEXA +osteoporosis noted 06/07/04 CT chest5/13/19 + lung nodules With h/o pulm nodules on CXR and CT chest10/11/16and former smoker -repeat CTA chest 06/13/18 neg pulmonary nodules Dermreferred [facility] derm est as of 06/2019  -H/o skin cancer  -we discussed possible serious and likely etiologies, options for evaluation and workup, limitations of telemedicine visit vs in person visit, treatment, treatment risks and precautions. Pt prefers to treat via telemedicine empirically rather then risking or undertaking an in person visit at this moment. Patient agrees to seek prompt in person care if worsening, new symptoms arise, or if is not improving with treatment.   I discussed the assessment and treatment plan with the patient. The patient was provided an opportunity to ask questions and all were answered. The patient agreed with the plan and demonstrated an understanding of the instructions.   The patient was advised to call back or seek an in-person evaluation if the symptoms worsen or if the condition fails to improve as anticipated.  Time spent 20-25 minutes  Delorise Jackson, MD

## 2019-10-03 ENCOUNTER — Telehealth: Payer: Self-pay | Admitting: Internal Medicine

## 2019-10-03 ENCOUNTER — Encounter: Payer: Self-pay | Admitting: Internal Medicine

## 2019-10-03 MED ORDER — AMITRIPTYLINE HCL 50 MG PO TABS: 50.0000 mg | ORAL_TABLET | Freq: Every evening | ORAL | Status: AC | PRN

## 2019-10-03 NOTE — Patient Instructions (Signed)
Try Pirq shakes from [employer] Premier protein shakes  Ensure max protein shakes   2-3 x per day all with low sugar if you appetite is low    Take care  TMS Please have labs at the hospital the 1st or 2nd week of February and we will order vascular ultrasound of the arteries of your legs   Please take gabapentin dose at night as directed Alpha lipoic acid 600 mg 2x per day sent to pharmacy    Neuropathic Pain Neuropathic pain is pain caused by damage to the nerves that are responsible for certain sensations in your body (sensory nerves). The pain can be caused by:  Damage to the sensory nerves that send signals to your spinal cord and brain (peripheral nervous system).  Damage to the sensory nerves in your brain or spinal cord (central nervous system). Neuropathic pain can make you more sensitive to pain. Even a minor sensation can feel very painful. This is usually a long-term condition that can be difficult to treat. The type of pain differs from person to person. It may:  Start suddenly (acute), or it may develop slowly and last for a long time (chronic).  Come and go as damaged nerves heal, or it may stay at the same level for years.  Cause emotional distress, loss of sleep, and a lower quality of life. What are the causes? The most common cause of this condition is diabetes. Many other diseases and conditions can also cause neuropathic pain. Causes of neuropathic pain can be classified as:  Toxic. This is caused by medicines and chemicals. The most common cause of toxic neuropathic pain is damage from cancer treatments (chemotherapy).  Metabolic. This can be caused by: ? Diabetes. This is the most common disease that damages the nerves. ? Lack of vitamin B from long-term alcohol abuse.  Traumatic. Any injury that cuts, crushes, or stretches a nerve can cause damage and pain. A common example is feeling pain after losing an arm or leg (phantom limb  pain).  Compression-related. If a sensory nerve gets trapped or compressed for a long period of time, the blood supply to the nerve can be cut off.  Vascular. Many blood vessel diseases can cause neuropathic pain by decreasing blood supply and oxygen to nerves.  Autoimmune. This type of pain results from diseases in which the body's defense system (immune system) mistakenly attacks sensory nerves. Examples of autoimmune diseases that can cause neuropathic pain include lupus and multiple sclerosis.  Infectious. Many types of viral infections can damage sensory nerves and cause pain. Shingles infection is a common cause of this type of pain.  Inherited. Neuropathic pain can be a symptom of many diseases that are passed down through families (genetic). What increases the risk? You are more likely to develop this condition if:  You have diabetes.  You smoke.  You drink too much alcohol.  You are taking certain medicines, including medicines that kill cancer cells (chemotherapy) or that treat immune system disorders. What are the signs or symptoms? The main symptom is pain. Neuropathic pain is often described as:  Burning.  Shock-like.  Stinging.  Hot or cold.  Itching. How is this diagnosed? No single test can diagnose neuropathic pain. It is diagnosed based on:  Physical exam and your symptoms. Your health care provider will ask you about your pain. You may be asked to use a pain scale to describe how bad your pain is.  Tests. These may be done to see if  you have a high sensitivity to pain and to help find the cause and location of any sensory nerve damage. They include: ? Nerve conduction studies to test how well nerve signals travel through your sensory nerves (electrodiagnostic testing). ? Stimulating your sensory nerves through electrodes on your skin and measuring the response in your spinal cord and brain (somatosensory evoked potential).  Imaging studies, such  as: ? X-rays. ? CT scan. ? MRI. How is this treated? Treatment for neuropathic pain may change over time. You may need to try different treatment options or a combination of treatments. Some options include:  Treating the underlying cause of the neuropathy, such as diabetes, kidney disease, or vitamin deficiencies.  Stopping medicines that can cause neuropathy, such as chemotherapy.  Medicine to relieve pain. Medicines may include: ? Prescription or over-the-counter pain medicine. ? Anti-seizure medicine. ? Antidepressant medicines. ? Pain-relieving patches that are applied to painful areas of skin. ? A medicine to numb the area (local anesthetic), which can be injected as a nerve block.  Transcutaneous nerve stimulation. This uses electrical currents to block painful nerve signals. The treatment is painless.  Alternative treatments, such as: ? Acupuncture. ? Meditation. ? Massage. ? Physical therapy. ? Pain management programs. ? Counseling. Follow these instructions at home: Medicines   Take over-the-counter and prescription medicines only as told by your health care provider.  Do not drive or use heavy machinery while taking prescription pain medicine.  If you are taking prescription pain medicine, take actions to prevent or treat constipation. Your health care provider may recommend that you: ? Drink enough fluid to keep your urine pale yellow. ? Eat foods that are high in fiber, such as fresh fruits and vegetables, whole grains, and beans. ? Limit foods that are high in fat and processed sugars, such as fried or sweet foods. ? Take an over-the-counter or prescription medicine for constipation. Lifestyle   Have a good support system at home.  Consider joining a chronic pain support group.  Do not use any products that contain nicotine or tobacco, such as cigarettes and e-cigarettes. If you need help quitting, ask your health care provider.  Do not drink  alcohol. General instructions  Learn as much as you can about your condition.  Work closely with all your health care providers to find the treatment plan that works best for you.  Ask your health care provider what activities are safe for you.  Keep all follow-up visits as told by your health care provider. This is important. Contact a health care provider if:  Your pain treatments are not working.  You are having side effects from your medicines.  You are struggling with tiredness (fatigue), mood changes, depression, or anxiety. Summary  Neuropathic pain is pain caused by damage to the nerves that are responsible for certain sensations in your body (sensory nerves).  Neuropathic pain may come and go as damaged nerves heal, or it may stay at the same level for years.  Neuropathic pain is usually a long-term condition that can be difficult to treat. Consider joining a chronic pain support group. This information is not intended to replace advice given to you by your health care provider. Make sure you discuss any questions you have with your health care provider. Document Revised: 12/21/2018 Document Reviewed: 09/16/2017 Elsevier Patient Education  [place].

## 2019-10-03 NOTE — Telephone Encounter (Signed)
Verbal was left on VM.

## 2019-10-03 NOTE — Telephone Encounter (Signed)
Chase Cabrera [phone] called from [facility] regarding OT for once a week for three weeks and then twice a week for two weeks. Home health aid to assist with bathing for freq 1 week one two week 2 then 1 week 1. Please advise and thank you!

## 2019-10-08 ENCOUNTER — Emergency Department: Payer: Medicare Other

## 2019-10-08 ENCOUNTER — Other Ambulatory Visit: Payer: Self-pay

## 2019-10-08 ENCOUNTER — Telehealth: Payer: Self-pay | Admitting: Internal Medicine

## 2019-10-08 ENCOUNTER — Inpatient Hospital Stay
Admission: EM | Admit: 2019-10-08 | Discharge: 2019-10-15 | DRG: 193 | Disposition: E | Payer: Medicare Other | Attending: Internal Medicine | Admitting: Internal Medicine

## 2019-10-08 DIAGNOSIS — J9620 Acute and chronic respiratory failure, unspecified whether with hypoxia or hypercapnia: Secondary | ICD-10-CM

## 2019-10-08 DIAGNOSIS — Z955 Presence of coronary angioplasty implant and graft: Secondary | ICD-10-CM | POA: Diagnosis not present

## 2019-10-08 DIAGNOSIS — Z87891 Personal history of nicotine dependence: Secondary | ICD-10-CM

## 2019-10-08 DIAGNOSIS — Z87442 Personal history of urinary calculi: Secondary | ICD-10-CM | POA: Diagnosis not present

## 2019-10-08 DIAGNOSIS — I480 Paroxysmal atrial fibrillation: Secondary | ICD-10-CM | POA: Diagnosis present

## 2019-10-08 DIAGNOSIS — Z888 Allergy status to other drugs, medicaments and biological substances status: Secondary | ICD-10-CM

## 2019-10-08 DIAGNOSIS — I700 Atherosclerosis of aorta: Secondary | ICD-10-CM | POA: Diagnosis present

## 2019-10-08 DIAGNOSIS — Z79899 Other long term (current) drug therapy: Secondary | ICD-10-CM

## 2019-10-08 DIAGNOSIS — Z800 Family history of malignant neoplasm of digestive organs: Secondary | ICD-10-CM

## 2019-10-08 DIAGNOSIS — J1289 Other viral pneumonia: Principal | ICD-10-CM | POA: Diagnosis present

## 2019-10-08 DIAGNOSIS — Z9842 Cataract extraction status, left eye: Secondary | ICD-10-CM

## 2019-10-08 DIAGNOSIS — Z86010 Personal history of colonic polyps: Secondary | ICD-10-CM | POA: Diagnosis not present

## 2019-10-08 DIAGNOSIS — H9190 Unspecified hearing loss, unspecified ear: Secondary | ICD-10-CM | POA: Diagnosis present

## 2019-10-08 DIAGNOSIS — E1142 Type 2 diabetes mellitus with diabetic polyneuropathy: Secondary | ICD-10-CM | POA: Diagnosis present

## 2019-10-08 DIAGNOSIS — I110 Hypertensive heart disease with heart failure: Secondary | ICD-10-CM | POA: Diagnosis present

## 2019-10-08 DIAGNOSIS — K5900 Constipation, unspecified: Secondary | ICD-10-CM | POA: Diagnosis not present

## 2019-10-08 DIAGNOSIS — E785 Hyperlipidemia, unspecified: Secondary | ICD-10-CM | POA: Diagnosis present

## 2019-10-08 DIAGNOSIS — J8410 Pulmonary fibrosis, unspecified: Secondary | ICD-10-CM | POA: Diagnosis present

## 2019-10-08 DIAGNOSIS — G4733 Obstructive sleep apnea (adult) (pediatric): Secondary | ICD-10-CM | POA: Diagnosis present

## 2019-10-08 DIAGNOSIS — J9622 Acute and chronic respiratory failure with hypercapnia: Secondary | ICD-10-CM | POA: Diagnosis present

## 2019-10-08 DIAGNOSIS — B948 Sequelae of other specified infectious and parasitic diseases: Secondary | ICD-10-CM | POA: Diagnosis not present

## 2019-10-08 DIAGNOSIS — J441 Chronic obstructive pulmonary disease with (acute) exacerbation: Secondary | ICD-10-CM | POA: Diagnosis not present

## 2019-10-08 DIAGNOSIS — Z801 Family history of malignant neoplasm of trachea, bronchus and lung: Secondary | ICD-10-CM

## 2019-10-08 DIAGNOSIS — I2510 Atherosclerotic heart disease of native coronary artery without angina pectoris: Secondary | ICD-10-CM | POA: Diagnosis present

## 2019-10-08 DIAGNOSIS — I5043 Acute on chronic combined systolic (congestive) and diastolic (congestive) heart failure: Secondary | ICD-10-CM | POA: Diagnosis not present

## 2019-10-08 DIAGNOSIS — Z515 Encounter for palliative care: Secondary | ICD-10-CM | POA: Diagnosis not present

## 2019-10-08 DIAGNOSIS — J9601 Acute respiratory failure with hypoxia: Secondary | ICD-10-CM | POA: Diagnosis present

## 2019-10-08 DIAGNOSIS — R0602 Shortness of breath: Secondary | ICD-10-CM

## 2019-10-08 DIAGNOSIS — Z9841 Cataract extraction status, right eye: Secondary | ICD-10-CM

## 2019-10-08 DIAGNOSIS — U071 COVID-19: Secondary | ICD-10-CM | POA: Diagnosis not present

## 2019-10-08 DIAGNOSIS — Z823 Family history of stroke: Secondary | ICD-10-CM

## 2019-10-08 DIAGNOSIS — Z794 Long term (current) use of insulin: Secondary | ICD-10-CM

## 2019-10-08 DIAGNOSIS — Z8261 Family history of arthritis: Secondary | ICD-10-CM

## 2019-10-08 DIAGNOSIS — Z8711 Personal history of peptic ulcer disease: Secondary | ICD-10-CM

## 2019-10-08 DIAGNOSIS — M109 Gout, unspecified: Secondary | ICD-10-CM | POA: Diagnosis present

## 2019-10-08 DIAGNOSIS — B952 Enterococcus as the cause of diseases classified elsewhere: Secondary | ICD-10-CM | POA: Diagnosis present

## 2019-10-08 DIAGNOSIS — Z7982 Long term (current) use of aspirin: Secondary | ICD-10-CM

## 2019-10-08 DIAGNOSIS — Z66 Do not resuscitate: Secondary | ICD-10-CM | POA: Diagnosis not present

## 2019-10-08 DIAGNOSIS — Z951 Presence of aortocoronary bypass graft: Secondary | ICD-10-CM

## 2019-10-08 DIAGNOSIS — Z7189 Other specified counseling: Secondary | ICD-10-CM

## 2019-10-08 DIAGNOSIS — Z9049 Acquired absence of other specified parts of digestive tract: Secondary | ICD-10-CM

## 2019-10-08 DIAGNOSIS — I2721 Secondary pulmonary arterial hypertension: Secondary | ICD-10-CM | POA: Diagnosis present

## 2019-10-08 DIAGNOSIS — F419 Anxiety disorder, unspecified: Secondary | ICD-10-CM | POA: Diagnosis present

## 2019-10-08 DIAGNOSIS — K5090 Crohn's disease, unspecified, without complications: Secondary | ICD-10-CM | POA: Diagnosis present

## 2019-10-08 DIAGNOSIS — J470 Bronchiectasis with acute lower respiratory infection: Secondary | ICD-10-CM | POA: Diagnosis not present

## 2019-10-08 DIAGNOSIS — J9621 Acute and chronic respiratory failure with hypoxia: Secondary | ICD-10-CM | POA: Diagnosis present

## 2019-10-08 DIAGNOSIS — Z7902 Long term (current) use of antithrombotics/antiplatelets: Secondary | ICD-10-CM

## 2019-10-08 DIAGNOSIS — E1165 Type 2 diabetes mellitus with hyperglycemia: Secondary | ICD-10-CM | POA: Diagnosis present

## 2019-10-08 DIAGNOSIS — K219 Gastro-esophageal reflux disease without esophagitis: Secondary | ICD-10-CM | POA: Diagnosis present

## 2019-10-08 DIAGNOSIS — E873 Alkalosis: Secondary | ICD-10-CM | POA: Diagnosis present

## 2019-10-08 DIAGNOSIS — G319 Degenerative disease of nervous system, unspecified: Secondary | ICD-10-CM | POA: Diagnosis present

## 2019-10-08 DIAGNOSIS — R509 Fever, unspecified: Secondary | ICD-10-CM

## 2019-10-08 DIAGNOSIS — Z96643 Presence of artificial hip joint, bilateral: Secondary | ICD-10-CM | POA: Diagnosis present

## 2019-10-08 DIAGNOSIS — I252 Old myocardial infarction: Secondary | ICD-10-CM

## 2019-10-08 DIAGNOSIS — Z833 Family history of diabetes mellitus: Secondary | ICD-10-CM

## 2019-10-08 DIAGNOSIS — Z981 Arthrodesis status: Secondary | ICD-10-CM

## 2019-10-08 DIAGNOSIS — J84112 Idiopathic pulmonary fibrosis: Secondary | ICD-10-CM | POA: Diagnosis not present

## 2019-10-08 DIAGNOSIS — I10 Essential (primary) hypertension: Secondary | ICD-10-CM

## 2019-10-08 LAB — GLUCOSE, CAPILLARY: Glucose-Capillary: 249 mg/dL — ABNORMAL HIGH (ref 70–99)

## 2019-10-08 LAB — PROCALCITONIN: Procalcitonin: <0.1 ng/mL

## 2019-10-08 LAB — COMPREHENSIVE METABOLIC PANEL
ALT: 25 U/L (ref 0–44)
AST: 56 U/L — ABNORMAL HIGH (ref 15–41)
Albumin: 3 g/dL — ABNORMAL LOW (ref 3.5–5.0)
Alkaline Phosphatase: 51 U/L (ref 38–126)
Anion gap: 10 (ref 5–15)
BUN: 13 mg/dL (ref 8–23)
CO2: 28 mmol/L (ref 22–32)
Calcium: 8.6 mg/dL — ABNORMAL LOW (ref 8.9–10.3)
Chloride: 93 mmol/L — ABNORMAL LOW (ref 98–111)
Creatinine, Ser: 1.05 mg/dL (ref 0.61–1.24)
GFR calc Af Amer: >60 mL/min (ref >60)
GFR calc non Af Amer: >60 mL/min (ref >60)
Glucose, Bld: 314 mg/dL — ABNORMAL HIGH (ref 70–99)
Potassium: 3.3 mmol/L — ABNORMAL LOW (ref 3.5–5.1)
Sodium: 131 mmol/L — ABNORMAL LOW (ref 135–145)
Total Bilirubin: 1.3 mg/dL — ABNORMAL HIGH (ref 0.3–1.2)
Total Protein: 7.2 g/dL (ref 6.5–8.1)

## 2019-10-08 LAB — URINALYSIS, ROUTINE W REFLEX MICROSCOPIC
Bacteria, UA: NONE SEEN
Bilirubin Urine: NEGATIVE
Glucose, UA: >=500 mg/dL — AB
Hgb urine dipstick: NEGATIVE
Ketones, ur: NEGATIVE mg/dL
Leukocytes,Ua: NEGATIVE
Nitrite: NEGATIVE
Protein, ur: NEGATIVE mg/dL
Specific Gravity, Urine: 1.008 (ref 1.005–1.030)
Squamous Epithelial / HPF: NONE SEEN (ref 0–5)
pH: 7 (ref 5.0–8.0)

## 2019-10-08 LAB — CBC WITH DIFFERENTIAL/PLATELET
Abs Immature Granulocytes: 0.04 10*3/uL (ref 0.00–0.07)
Basophils Absolute: 0 10*3/uL (ref 0.0–0.1)
Basophils Relative: 0 %
Eosinophils Absolute: 0.1 10*3/uL (ref 0.0–0.5)
Eosinophils Relative: 1 %
HCT: 38 % — ABNORMAL LOW (ref 39.0–52.0)
Hemoglobin: 13.1 g/dL (ref 13.0–17.0)
Immature Granulocytes: 0 %
Lymphocytes Relative: 11 %
Lymphs Abs: 1.1 10*3/uL (ref 0.7–4.0)
MCH: 34.8 pg — ABNORMAL HIGH (ref 26.0–34.0)
MCHC: 34.5 g/dL (ref 30.0–36.0)
MCV: 101.1 fL — ABNORMAL HIGH (ref 80.0–100.0)
Monocytes Absolute: 1.3 10*3/uL — ABNORMAL HIGH (ref 0.1–1.0)
Monocytes Relative: 14 %
Neutro Abs: 7.2 10*3/uL (ref 1.7–7.7)
Neutrophils Relative %: 74 %
Platelets: 275 10*3/uL (ref 150–400)
RBC: 3.76 MIL/uL — ABNORMAL LOW (ref 4.22–5.81)
RDW: 14.1 % (ref 11.5–15.5)
WBC: 9.8 10*3/uL (ref 4.0–10.5)
nRBC: 0 % (ref 0.0–0.2)

## 2019-10-08 LAB — TROPONIN I (HIGH SENSITIVITY)
Troponin I (High Sensitivity): 16 ng/L (ref <18)
Troponin I (High Sensitivity): 14 ng/L (ref <18)

## 2019-10-08 LAB — C-REACTIVE PROTEIN: CRP: 12.3 mg/dL — ABNORMAL HIGH (ref <1.0)

## 2019-10-08 LAB — BRAIN NATRIURETIC PEPTIDE: B Natriuretic Peptide: 679 pg/mL — ABNORMAL HIGH (ref 0.0–100.0)

## 2019-10-08 LAB — LACTIC ACID, PLASMA: Lactic Acid, Venous: 1.9 mmol/L (ref 0.5–1.9)

## 2019-10-08 MED ORDER — PANTOPRAZOLE SODIUM 40 MG PO TBEC
40.0000 mg | DELAYED_RELEASE_TABLET | Freq: Every day | ORAL | Status: DC
  Administered 2019-10-09 – 2019-10-13 (×5): 40 mg via ORAL
  Filled 2019-10-08 (×5): qty 1

## 2019-10-08 MED ORDER — SODIUM CHLORIDE 0.9 % IV BOLUS
500.0000 mL | Freq: Once | INTRAVENOUS | Status: AC
  Administered 2019-10-08: 500 mL via INTRAVENOUS

## 2019-10-08 MED ORDER — VANCOMYCIN HCL 500 MG/100ML IV SOLN
500.0000 mg | Freq: Once | INTRAVENOUS | Status: DC
  Filled 2019-10-08: qty 100

## 2019-10-08 MED ORDER — DOCUSATE SODIUM 100 MG PO CAPS: 100.0000 mg | ORAL_CAPSULE | Freq: Two times a day (BID) | ORAL | Status: DC | PRN

## 2019-10-08 MED ORDER — ACETAMINOPHEN 500 MG PO TABS
1000.0000 mg | ORAL_TABLET | Freq: Three times a day (TID) | ORAL | Status: DC | PRN
  Administered 2019-10-12: 1000 mg via ORAL
  Filled 2019-10-08: qty 2

## 2019-10-08 MED ORDER — VANCOMYCIN HCL IN DEXTROSE 1-5 GM/200ML-% IV SOLN
1000.0000 mg | Freq: Once | INTRAVENOUS | Status: DC
  Administered 2019-10-08: 18:00:00 1000 mg via INTRAVENOUS
  Filled 2019-10-08: qty 200

## 2019-10-08 MED ORDER — PROBENECID 500 MG PO TABS
1000.0000 mg | ORAL_TABLET | Freq: Two times a day (BID) | ORAL | Status: DC
  Administered 2019-10-08 – 2019-10-13 (×10): 1000 mg via ORAL
  Filled 2019-10-08 (×14): qty 2

## 2019-10-08 MED ORDER — INSULIN GLARGINE 100 UNIT/ML ~~LOC~~ SOLN
60.0000 [IU] | Freq: Every day | SUBCUTANEOUS | Status: DC
  Administered 2019-10-08 – 2019-10-13 (×6): 60 [IU] via SUBCUTANEOUS
  Filled 2019-10-08 (×7): qty 0.6

## 2019-10-08 MED ORDER — CLOPIDOGREL BISULFATE 75 MG PO TABS
75.0000 mg | ORAL_TABLET | Freq: Every day | ORAL | Status: DC
  Administered 2019-10-09 – 2019-10-13 (×5): 75 mg via ORAL
  Filled 2019-10-08 (×5): qty 1

## 2019-10-08 MED ORDER — POLYETHYLENE GLYCOL 3350 17 G PO PACK: 17.0000 g | PACK | Freq: Two times a day (BID) | ORAL | Status: DC | PRN

## 2019-10-08 MED ORDER — DICYCLOMINE HCL 10 MG PO CAPS
10.0000 mg | ORAL_CAPSULE | Freq: Three times a day (TID) | ORAL | Status: DC
  Administered 2019-10-09 – 2019-10-13 (×13): 10 mg via ORAL
  Filled 2019-10-08 (×20): qty 1

## 2019-10-08 MED ORDER — GABAPENTIN 300 MG PO CAPS
600.0000 mg | ORAL_CAPSULE | Freq: Three times a day (TID) | ORAL | Status: DC
  Administered 2019-10-08 – 2019-10-13 (×15): 600 mg via ORAL
  Filled 2019-10-08 (×15): qty 2

## 2019-10-08 MED ORDER — COLCHICINE 0.6 MG PO TABS
0.6000 mg | ORAL_TABLET | Freq: Every day | ORAL | Status: DC
  Administered 2019-10-09 – 2019-10-13 (×5): 0.6 mg via ORAL
  Filled 2019-10-08 (×6): qty 1

## 2019-10-08 MED ORDER — INSULIN ASPART 100 UNIT/ML ~~LOC~~ SOLN
0.0000 [IU] | Freq: Three times a day (TID) | SUBCUTANEOUS | Status: DC
  Administered 2019-10-09: 4 [IU] via SUBCUTANEOUS
  Administered 2019-10-09: 7 [IU] via SUBCUTANEOUS
  Administered 2019-10-09: 13:00:00 4 [IU] via SUBCUTANEOUS
  Administered 2019-10-10: 17:00:00 15 [IU] via SUBCUTANEOUS
  Administered 2019-10-10: 08:00:00 11 [IU] via SUBCUTANEOUS
  Administered 2019-10-10: 20 [IU] via SUBCUTANEOUS
  Administered 2019-10-11 (×2): 3 [IU] via SUBCUTANEOUS
  Administered 2019-10-11 – 2019-10-12 (×3): 4 [IU] via SUBCUTANEOUS
  Administered 2019-10-13: 11 [IU] via SUBCUTANEOUS
  Filled 2019-10-08 (×12): qty 1

## 2019-10-08 MED ORDER — ASPIRIN EC 81 MG PO TBEC
81.0000 mg | DELAYED_RELEASE_TABLET | Freq: Every day | ORAL | Status: DC
  Administered 2019-10-09 – 2019-10-13 (×5): 81 mg via ORAL
  Filled 2019-10-08 (×5): qty 1

## 2019-10-08 MED ORDER — SODIUM CHLORIDE 0.9 % IV SOLN
2.0000 g | Freq: Once | INTRAVENOUS | Status: DC
  Administered 2019-10-08: 18:00:00 2 g via INTRAVENOUS
  Filled 2019-10-08: qty 2

## 2019-10-08 MED ORDER — ALUM & MAG HYDROXIDE-SIMETH 200-200-20 MG/5ML PO SUSP: 15.0000 mL | Freq: Four times a day (QID) | ORAL | Status: DC | PRN

## 2019-10-08 MED ORDER — ONDANSETRON HCL 4 MG/2ML IJ SOLN
4.0000 mg | Freq: Four times a day (QID) | INTRAMUSCULAR | Status: DC | PRN
  Administered 2019-10-13: 4 mg via INTRAVENOUS
  Filled 2019-10-08: qty 2

## 2019-10-08 MED ORDER — ONDANSETRON 4 MG PO TBDP
4.0000 mg | ORAL_TABLET | Freq: Three times a day (TID) | ORAL | Status: DC | PRN
  Administered 2019-10-11: 4 mg via ORAL
  Filled 2019-10-08 (×2): qty 1

## 2019-10-08 MED ORDER — IOHEXOL 350 MG/ML SOLN
75.0000 mL | Freq: Once | INTRAVENOUS | Status: AC | PRN
  Administered 2019-10-08: 16:00:00 75 mL via INTRAVENOUS

## 2019-10-08 MED ORDER — PRAVASTATIN SODIUM 20 MG PO TABS
40.0000 mg | ORAL_TABLET | Freq: Every day | ORAL | Status: DC
  Administered 2019-10-10 – 2019-10-12 (×3): 40 mg via ORAL
  Filled 2019-10-08 (×4): qty 2

## 2019-10-08 MED ORDER — GUAIFENESIN-DM 100-10 MG/5ML PO SYRP
10.0000 mL | ORAL_SOLUTION | Freq: Four times a day (QID) | ORAL | Status: DC | PRN
  Administered 2019-10-11 – 2019-10-12 (×2): 10 mL via ORAL
  Filled 2019-10-08 (×3): qty 10

## 2019-10-08 MED ORDER — CALCIUM CARBONATE ANTACID 500 MG PO CHEW: 1.0000 | CHEWABLE_TABLET | Freq: Three times a day (TID) | ORAL | Status: DC | PRN

## 2019-10-08 MED ORDER — ENOXAPARIN SODIUM 40 MG/0.4ML ~~LOC~~ SOLN
40.0000 mg | SUBCUTANEOUS | Status: DC
  Administered 2019-10-08: 40 mg via SUBCUTANEOUS
  Filled 2019-10-08: qty 0.4

## 2019-10-08 NOTE — Telephone Encounter (Signed)
Pt called in said got out of the hospital 3 weeks ago for covid. Has a temperature of 101, throwing up, and has 85:5 on 3 liters of oxygen. Transferred to Access Nurse.

## 2019-10-08 NOTE — Telephone Encounter (Signed)
FYI patient currently in ED.

## 2019-10-08 NOTE — ED Notes (Signed)
[place] NP messaged regarding O2 sat of 87-91% on 6L [place].

## 2019-10-08 NOTE — Telephone Encounter (Signed)
PLEASE NOTE: All timestamps contained within this report are represented as [place] Standard Time. CONFIDENTIALTY NOTICE: This fax transmission is intended only for the addressee. It contains information that is legally privileged, confidential or otherwise protected from use or disclosure. If you are not the intended recipient, you are strictly prohibited from reviewing, disclosing, copying using or disseminating any of this information or taking any action in reliance on or regarding this information. If you have received this fax in error, please notify us immediately by telephone so that we can arrange for its return to [place]. Phone: [phone], Toll-Free: [phone], Fax: [phone] Page: 1 of 2 Call Id: [id number] [place] [facility] Patient Name: Chase Cabrera. Gender: Male DOB: 22-Aug-1946 Age: 74 Y 2 M 27 D Return Phone Number: [phone] (Primary), [phone] (Secondary) Address: City/State/Zip: [place] [address] Client  Primary Care [place] [facility] - [place] Client Site [place] - Day Physician McClean-Scocuzza, Olivia Mackie Contact Type Call Who Is Calling Patient / Member / Family / Caregiver Call Type Triage / Clinical Caller Name Cace Osorto Relationship To Patient [place] Return Phone Number [phone] (Primary) Chief Complaint BREATHING - shortness of breath or sounds breathless Reason for Call Symptomatic / Request for [place] states that her grandfather had Covid and was in the hospital for 3 weeks. He has a fever of 101, he is nauseous, and his O2 is 85 with 3 liters of oxygen. [place] Not Listed [place] Translation No Nurse Assessment Nurse: Raphael Gibney, RN, Vera Date/Time (Eastern Time): 10/04/2019 1:12:35 PM Confirm and document reason for call. If symptomatic, describe symptoms. ---Caller states her  grandfather had COVID and was in the hospital for 4 weeks. He also had pneumonia. He is on oxygen. Temp 101. He is nauseated and weak. Oxygen level is 85% on 3 LPM. Has the patient had close contact with a person known or suspected to have the novel coronavirus illness OR traveled / lives in area with major community spread (including international travel) in the last 14 days from the onset of symptoms? * If Asymptomatic, screen for exposure and travel within the last 14 days. ---No Does the patient have any new or worsening symptoms? ---Yes Will a triage be completed? ---Yes Related visit to physician within the last 2 weeks? ---No Does the PT have any chronic conditions? (i.e. diabetes, asthma, this includes High risk factors for pregnancy, etc.) ---Yes List chronic conditions. ---diabetes; heart problems; CHF; MI x 5; TIA Is this a behavioral health or substance abuse call? ---No PLEASE NOTE: All timestamps contained within this report are represented as [place] Standard Time. CONFIDENTIALTY NOTICE: This fax transmission is intended only for the addressee. It contains information that is legally privileged, confidential or otherwise protected from use or disclosure. If you are not the intended recipient, you are strictly prohibited from reviewing, disclosing, copying using or disseminating any of this information or taking any action in reliance on or regarding this information. If you have received this fax in error, please notify us immediately by telephone so that we can arrange for its return to [place]. Phone: [phone], Toll-Free: [phone], Fax: [phone] Page: 2 of 2 Call Id: [id number] Guidelines Guideline Title Affirmed Question Affirmed Notes Nurse Date/Time [place]ilene Ghazi Time) Coronavirus (COVID-19) - Diagnosed or Suspected MODERATE difficulty breathing (e.g., speaks in phrases, SOB even at rest, pulse 100-120) Raphael Gibney, RN, Vanita Ingles 09/26/2019 1:15:23 PM Disp. Time Eilene Ghazi  Time) Disposition Final User 09/23/2019 1:10:48  PM Send to Urgent Queue Windy Canny 09/15/2019 1:18:25 PM Go to ED Now Yes Raphael Gibney, RN, Doreatha Lew Disagree/Comply Comply Caller Understands Yes PreDisposition Go to ED Care Advice Given Per Guideline * You need to be seen in the Emergency Department. GO TO ED NOW: NOTE TO TRIAGER - TRIAGE NURSE SHOULD NOTIFY EMERGENCY DEPARTMENT (ED): * Tell the ED you are sending a patient with suspected diagnosis of COVID-19 who is getting worse and inform them of patient's symptoms. YOU SHOULD TELL HEALTHCARE PERSONNEL THAT YOU MIGHT HAVE COVID-19: * Tell the first healthcare worker you meet that you may have COVID-19. * Tell them you have symptoms and have been sent for COVID-19 testing.

## 2019-10-08 NOTE — ED Notes (Signed)
Called respiratory to request [facility]NC

## 2019-10-08 NOTE — Telephone Encounter (Signed)
Agree with back to ed and saw this   Thanks [place]

## 2019-10-08 NOTE — H&P (Signed)
History and Physical    Chase Cabrera:[id number] DOB: 24-Mar-1946 DOA: 10/03/2019  PCP: McLean-Scocuzza, Nino Glow, MD  Patient coming from: home  I have personally briefly reviewed patient's old medical records in [place]  Chief Complaint: dyspnea  HPI: Chase Cabrera is a 74 y.o. Caucasian male with medical history significant of bronchiectasis, OSA not on CPAP, DM2 on insulin, MI, CAD status post multiple stents, HTN, gout who presented with worsening shortness of breath.  Patient had a recent diagnosis of Covid on 09/04/19 and was discharged on 09/25/2019 after receiving treatment with remdesivir, Actemra, and Decadron which he completed on 1/23.  Patient reported he was discharged on 3 L O2 and has been doing better until today when he started getting more short of breath.  Patient also had nausea but no vomiting.  Patient reported taking Lasix every day and has been having good urine output.  Per patient he had a fever of 103 this morning and had an oxygen level of 85% on his 3 L.  No chest pain, abdominal pain, diarrhea, dysuria, increased swelling.  ED Course: Initial vitals: Temp 100.7, pulse 52, BP 107/80, RR 24, satting 93% on 6 L O2.  Labs notable for normal WBC, BG 314, pro-Cal negative, trop neg, CTA showed no PE but "Chronic lung disease with progressive subpleural reticulation and patchy ground-glass opacities" and evidence of pulmonary hypertension.  Patient received one dose of Vanco and cefepime in the ED before admission.  Pt is admitted to step-down due to already being on 6L and high risk of decompensating.  Assessment/Plan Active Problems:   Acute respiratory failure with hypoxemia (HCC)  # Acute hypoxic respiratory failure # Recent COVID-19 infection # Hx of Bronchiectasis  --Fever.  Needed 6L O2 to maintain sats in low 90%. --Dx of Covid on 09/04/19 and was discharged on 09/25/2019 after receiving treatment with remdesivir, Actemra, and Decadron which  he completed on 1/23, and 3 L O2.  Was doing well until today.  DDx include sequelae of COVID, CHF, pulmonary HTN, Bronchiectasis exacerbation.  Superimposed PNA is less likely since procal is neg, and CT chest didn't show acute findings or consolidations but chronic lung disease findings.   PLAN: --continue suppl O2 to maintain sats >90% --Hold off abx --TTE --If CRP is elevated, start steroids --Considered diuretic, however, pt's low BP precludes that for now. --Consider Pulm consult if other causes of hypoxia ruled out.  # Hx of Bronchiectasis  --Last seen pulm Dr. Patsey Berthold on 08/17/19.   --Consider Pulm consult if other causes of hypoxia ruled out.  # Hx of OSA --Pt has declined CPAP in the past  # DM2 on insulin # Neuropathy --continue home Tresiba as 60u nightly (90u at home) --SSI TID --continue home gabapentin  # Hx of mild systolic CHF # Hx of pulm HTN --Last echo on 09/16/18 showed LVEF 45-50% --Hold home Lasix and other BP meds due to BP low on presentation.  Resume as BP allows. --TTE   # Hx of MI's and CAD s/p stents --continue home ASA, plavix and statin  # HTN --Hold home BP meds due to BP low on presentation.  Resume as BP allows.  # Hx of gout --continue home colchicine and Probenecid  # GERD --continue home PPI   DVT prophylaxis: Lovenox SQ Code Status: Full code  Family Communication: not today  Disposition Plan: Home  Consults called: none Admission status: Inpatient   Review of Systems: As per HPI otherwise  10 point review of systems negative.   Past Medical History:  Diagnosis Date  . Arthritis   . Atrial fibrillation ([place])   . CAD (coronary artery disease)    6 STENTS  . CHF (congestive heart failure) ([place])   . Chicken pox   . Colon polyp   . Corneal dystrophy    bilateral  . COVID-19    09/09/19   . Crohn's disease ([place])   . Diabetes ([place]) 2002  . Dysrhythmia    A-FIB AND PAF  . GERD (gastroesophageal reflux disease)   . Gout    . History of kidney stones   . History of shingles   . Hyperlipemia   . Hypertension    2/2 b/l RAS >60% [place] 06/2018 s/p right stent   . Kidney stones   . Myocardial infarction ([place])    x4, last one in 2010  . Peripheral neuropathy   . Peripheral neuropathy   . Peripheral neuropathy   . PUD (peptic ulcer disease)   . Rectus sheath hematoma    02/18/18 8.9 x 4.5 cm then 02/19/18 7.9 x 4.5 cm   . Renal artery stenosis ([place])   . Renal artery stenosis ([place])   . Salzmann's nodular dystrophy 2012  . Sleep apnea   . Spinal stenosis     Past Surgical History:  Procedure Laterality Date  . ABLATION  2012  . APPLICATION VERTERBRAL DEFECT PROSTHETIC  05/01/2013  . ARTHRODESIS ANTERIOR LUMBAR SPINE  05/01/2013  . BACK SURGERY     lumbar fusion  . CARDIAC CATHETERIZATION    . CARDIAC ELECTROPHYSIOLOGY STUDY AND ABLATION    . CARDIAC SURGERY    . CARDIOVERSION    . CHOLECYSTECTOMY    . COLONOSCOPY WITH PROPOFOL N/A 04/09/2016   Completed for chronic diarrhea.  Few scattered diverticuli.  No mucosal lesions.  Surgeon: Lollie Sails, MD;  Location: [facility] ENDOSCOPY;  Service: Endoscopy;  Laterality: N/A;  . CORNEAL EYE SURGERY Bilateral 07/28/11    09/22/2011  . CORONARY ANGIOPLASTY    . CORONARY ANGIOPLASTY WITH STENT PLACEMENT  03/28/2015   Distal 80% to normal Stent, Dilation Balloon  . CORONARY ARTERY BYPASS GRAFT     4 VESSELS  . CORONARY/GRAFT ACUTE MI REVASCULARIZATION N/A 09/16/2018   Procedure: Coronary/Graft Acute MI Revascularization;  Surgeon: Isaias Cowman, MD;  Location: [facility] CV LAB;  Service: Cardiovascular;  Laterality: N/A;  . EYE SURGERY     bilateral cataract 2017 Dr. Megan Mans  . foot and ankle repair Right 2005  . FRACTURE SURGERY     ANKLE PLATE AND SCREWS  . GALLBLADER    . JOINT REPLACEMENT     left total hip 11/11/15  . JOINT REPLACEMENT     right total hip 12/2017 Dr. Rudene Christians   . KNEE ARTHROSCOPY Right   . LEFT HEART CATH AND CORONARY ANGIOGRAPHY  N/A 09/16/2018   Procedure: LEFT HEART CATH AND CORONARY ANGIOGRAPHY;  Surgeon: Isaias Cowman, MD;  Location: [facility] CV LAB;  Service: Cardiovascular;  Laterality: N/A;  . LUMBAR SPINE FUSION ONE LEVEL  05/03/2013  . RENAL ARTERY STENT Right   . ROTATOR CUFF REPAIR Right 2006  . TONSILLECTOMY    . TOTAL HIP ARTHROPLASTY Left 11/11/2015   Procedure: TOTAL HIP ARTHROPLASTY ANTERIOR APPROACH;  Surgeon: Hessie Knows, MD;  Location: [facility] ORS;  Service: Orthopedics;  Laterality: Left;  . TOTAL HIP ARTHROPLASTY Right 12/13/2017   Procedure: TOTAL HIP ARTHROPLASTY ANTERIOR APPROACH;  Surgeon: Hessie Knows, MD;  Location: [facility] ORS;  Service: Orthopedics;  Laterality: Right;  . TRANSCATH PLACEMENT INTRAVASCULAR STENT LEG  03/2015     reports that he quit smoking about 57 years ago. His smoking use included cigarettes. He has a 3.00 pack-year smoking history. He quit smokeless tobacco use about 50 years ago.  His smokeless tobacco use included chew. He reports that he does not drink alcohol or use drugs.  Allergies  Allergen Reactions  . Allopurinol Diarrhea, Other (See Comments) and Nausea And Vomiting    Other Reaction: GI Upset  . Atenolol Other (See Comments)    Other Reaction: bradycardia  . Atorvastatin Other (See Comments)    Other Reaction: muscle aches  . Ramipril Rash  . Rosuvastatin Other (See Comments)    Muscle aches  . Simvastatin Other (See Comments)    Other Reaction: MUSCLE ACHES (Zocor)  . Valsartan Other (See Comments)    Other Reaction: facial swelling  . Cardizem [Diltiazem] Rash    Family History  Problem Relation Age of Onset  . CVA Father   . Stroke Father   . Lung cancer Mother   . Arthritis Mother   . Stomach cancer Sister   . Colon cancer Sister   . Diabetes Brother      Prior to Admission medications   Medication Sig Start Date End Date Taking? Authorizing Provider  Alpha-Lipoic Acid 600 MG CAPS Take 1 capsule (600 mg total) by mouth 2 (two)  times daily. 10/02/19   McLean-Scocuzza, Nino Glow, MD  amitriptyline (ELAVIL) 50 MG tablet Take 1 tablet (50 mg total) by mouth at bedtime as needed. 10/03/19   McLean-Scocuzza, Nino Glow, MD  amLODipine (NORVASC) 5 MG tablet Take 1 tablet (5 mg total) by mouth 2 (two) times daily. If blood pressure >140/>90 make take another 1 pill of 5 mg 05/01/18   Mayo, Pete Pelt, MD  aspirin EC 81 MG EC tablet Take 1 tablet (81 mg total) by mouth daily. 05/02/18   Mayo, Pete Pelt, MD  carvedilol (COREG) 25 MG tablet Take 1 tablet (25 mg total) by mouth 2 (two) times daily with a meal. 03/27/19   McLean-Scocuzza, Nino Glow, MD  Cholecalciferol 1.25 MG (50000 UT) TABS Take 1 tablet by mouth once a week. 07/12/19   McLean-Scocuzza, Nino Glow, MD  cloNIDine (CATAPRES) 0.2 MG tablet Take 1 tablet (0.2 mg total) by mouth 2 (two) times daily. 07/11/19   McLean-Scocuzza, Nino Glow, MD  clopidogrel (PLAVIX) 75 MG tablet Take 75 mg by mouth daily.    [provider]  colchicine 0.6 MG tablet TAKE 1 TABLET BY MOUTH ONCE DAILY Patient taking differently: Take 0.6 mg by mouth daily.  07/12/17   Leone Haven, MD  diclofenac sodium (VOLTAREN) 1 % GEL Apply 2 g topically 4 (four) times daily as needed (for pain).     [provider]  dicyclomine (BENTYL) 10 MG capsule Take 10 mg by mouth 3 (three) times daily before meals.     [provider]  fluticasone (FLONASE) 50 MCG/ACT nasal spray Place 2 sprays into both nostrils daily. 05/10/19   McLean-Scocuzza, Nino Glow, MD  furosemide (LASIX) 40 MG tablet Take 2 tablets (80 mg total) by mouth every other day. 09/25/19 09/24/20  Patrecia Pour, MD  gabapentin (NEURONTIN) 300 MG capsule take 2 capsules (621m) by mouth three times a day 10/02/19   McLean-Scocuzza, TNino Glow MD  hydrALAZINE (APRESOLINE) 100 MG tablet TAKE 100 MG BY MOUTH THREE TIMES A DAY 01/02/19  McLean-Scocuzza, Nino Glow, MD  insulin lispro (HUMALOG) 100 UNIT/ML KiwkPen Inject 14-32 Units into the skin 3  (three) times daily. (according to sliding scale - max 96u over 24 hours)    [provider]  ipratropium (ATROVENT) 0.06 % nasal spray Place 2 sprays into both nostrils 4 (four) times daily. 02/09/18   McLean-Scocuzza, Nino Glow, MD  isosorbide mononitrate (IMDUR) 120 MG 24 hr tablet TAKE 1 TABLET BY MOUTH EVERY DAY Patient taking differently: Take 120 mg by mouth daily.  10/07/17   McLean-Scocuzza, Nino Glow, MD  levocetirizine (XYZAL) 5 MG tablet Take 1 tablet (5 mg total) by mouth every evening. 02/09/18   McLean-Scocuzza, Nino Glow, MD  montelukast (SINGULAIR) 10 MG tablet Take 1 tablet (10 mg total) by mouth at bedtime. 07/03/19   McLean-Scocuzza, Nino Glow, MD  Multiple Vitamin (MULTI-VITAMINS) TABS Take 1 tablet by mouth daily. 04/15/09   [provider]  nitroGLYCERIN (NITROSTAT) 0.4 MG SL tablet Place 1 tablet (0.4 mg total) under the tongue every 5 (five) minutes x 3 doses as needed. For chest pain.  Call 911 if no relief after 3 tablets 10/03/17 11/02/21  McLean-Scocuzza, Nino Glow, MD  Olopatadine HCl 0.6 % SOLN Place 2 sprays into the nose 2 (two) times daily. 06/11/19   Tyler Pita, MD  omega-3 acid ethyl esters (LOVAZA) 1 g capsule Take 2 capsules (2 g total) by mouth 2 (two) times daily. 05/12/18   McLean-Scocuzza, Nino Glow, MD  omeprazole (PRILOSEC) 40 MG capsule Take 40 mg by mouth 2 (two) times daily.     [provider]  pravastatin (PRAVACHOL) 40 MG tablet Take 1 tablet (40 mg total) by mouth daily. 03/28/19   McLean-Scocuzza, Nino Glow, MD  probenecid (BENEMID) 500 MG tablet Take 1,000 mg by mouth 2 (two) times daily. 09/03/19   [provider]  sodium chloride HYPERTONIC 3 % nebulizer solution Take by nebulization 2 (two) times daily. 06/14/18   Juanito Doom, MD  spironolactone (ALDACTONE) 25 MG tablet Take 0.5 tablets (12.5 mg total) by mouth daily. 09/25/19 09/24/20  Patrecia Pour, MD  TRESIBA FLEXTOUCH 200 UNIT/ML SOPN Inject 90 Units into the skin at  bedtime. 04/25/18   [provider]  vitamin C (ASCORBIC ACID) 500 MG tablet Take 500 mg by mouth daily.     [provider]  Vitamin E 400 units TABS Take 400 Units by mouth 2 (two) times daily.    [provider]    Physical Exam: Vitals:   10/07/2019 1513 09/19/2019 1530 10/10/2019 1633 09/22/2019 1730  BP:  (!) 123/58  (!) 99/50  Pulse:  (!) 51  (!) 51  Resp:  (!) 35  (!) 31  Temp:   (!) 100.7 F (38.2 C)   TempSrc:   Rectal   SpO2:  94%  96%  Weight: 75.8 kg     Height: 5' 8"  (1.727 m)       Constitutional: NAD, AAOx3 HEENT: conjunctivae and lids normal, EOMI CV: RRR no M,R,G. Distal pulses +2.  No cyanosis.   RESP: fine crackles at posterior bases, normal respiratory effort, on 5L GI: +BS, NTND Extremities: No effusions, edema, or tenderness in BLE SKIN: warm, dry and intact Neuro: II - XII grossly intact.  Sensation intact Psych: Normal mood and affect.  Appropriate judgement and reason   Labs on Admission: I have personally reviewed following labs and imaging studies  CBC: Recent Labs  Lab 09/26/2019 1452  WBC 9.8  NEUTROABS  7.2  HGB 13.1  HCT 38.0*  MCV 101.1*  PLT 614   Basic Metabolic Panel: Recent Labs  Lab 09/30/2019 1452  NA 131*  K 3.3*  CL 93*  CO2 28  GLUCOSE 314*  BUN 13  CREATININE 1.05  CALCIUM 8.6*   GFR: Estimated Creatinine Clearance: 58.8 mL/min (by C-G formula based on SCr of 1.05 mg/dL). Liver Function Tests: Recent Labs  Lab 10/02/2019 1452  AST 56*  ALT 25  ALKPHOS 51  BILITOT 1.3*  PROT 7.2  ALBUMIN 3.0*   No results for input(s): LIPASE, AMYLASE in the last 168 hours. No results for input(s): AMMONIA in the last 168 hours. Coagulation Profile: No results for input(s): INR, PROTIME in the last 168 hours. Cardiac Enzymes: No results for input(s): CKTOTAL, CKMB, CKMBINDEX, TROPONINI in the last 168 hours. BNP (last 3 results) No results for input(s): PROBNP in the last 8760 hours. HbA1C: No results  for input(s): HGBA1C in the last 72 hours. CBG: No results for input(s): GLUCAP in the last 168 hours. Lipid Profile: No results for input(s): CHOL, HDL, LDLCALC, TRIG, CHOLHDL, LDLDIRECT in the last 72 hours. Thyroid Function Tests: No results for input(s): TSH, T4TOTAL, FREET4, T3FREE, THYROIDAB in the last 72 hours. Anemia Panel: No results for input(s): VITAMINB12, FOLATE, FERRITIN, TIBC, IRON, RETICCTPCT in the last 72 hours. Urine analysis:    Component Value Date/Time   COLORURINE YELLOW (A) 10/13/2019 1524   APPEARANCEUR CLEAR (A) 10/01/2019 1524   APPEARANCEUR Cloudy (A) 01/31/2018 1321   LABSPEC 1.008 10/11/2019 1524   PHURINE 7.0 10/09/2019 1524   GLUCOSEU >=500 (A) 10/11/2019 1524   HGBUR NEGATIVE 10/10/2019 1524   BILIRUBINUR NEGATIVE 10/01/2019 1524   BILIRUBINUR Negative 01/31/2018 1321   KETONESUR NEGATIVE 09/22/2019 1524   PROTEINUR NEGATIVE 09/22/2019 1524   NITRITE NEGATIVE 09/15/2019 1524   LEUKOCYTESUR NEGATIVE 09/28/2019 1524    Radiological Exams on Admission: CT Head Wo Contrast  Result Date: 10/12/2019 CLINICAL DATA:  Headache and shortness of breath. EXAM: CT HEAD WITHOUT CONTRAST TECHNIQUE: Contiguous axial images were obtained from the base of the skull through the vertex without intravenous contrast. COMPARISON:  Head CT 06/13/2018 FINDINGS: Brain: Stable age related cerebral atrophy, ventriculomegaly and periventricular white matter disease. No extra-axial fluid collections are identified. No CT findings for acute hemispheric infarction or intracranial hemorrhage. No mass lesions. The brainstem and cerebellum are normal. Vascular: No hyperdense vessels or aneurysm. Stable moderate vascular calcifications. Skull: No skull fracture or bone lesion. Sinuses/Orbits: The paranasal sinuses and mastoid air cells are clear. The globes are intact. Other: No scalp lesions or hematoma. IMPRESSION: 1. Stable age related cerebral atrophy, ventriculomegaly and  periventricular white matter disease. 2. No acute intracranial findings or mass lesions. Electronically Signed   By: Marijo Sanes M.D.   On: 09/18/2019 16:20   CT Angio Chest PE W and/or Wo Contrast  Result Date: 10/07/2019 CLINICAL DATA:  Shortness of breath and hypoxemia. Diagnosed with COVID-19 infection 3 weeks ago. History of diabetes and congestive heart failure. EXAM: CT ANGIOGRAPHY CHEST WITH CONTRAST TECHNIQUE: Multidetector CT imaging of the chest was performed using the standard protocol during bolus administration of intravenous contrast. Multiplanar CT image reconstructions and MIPs were obtained to evaluate the vascular anatomy. CONTRAST:  55m OMNIPAQUE IOHEXOL 350 MG/ML SOLN COMPARISON:  Chest CT 06/13/2018. FINDINGS: Cardiovascular: The pulmonary arteries are well opacified with contrast to the level of the subsegmental branches. There is no evidence of acute pulmonary embolism. There is stable central enlargement  of the pulmonary arteries consistent with pulmonary arterial hypertension. There is diffuse atherosclerosis of the aorta, great vessels and coronary arteries status post median sternotomy and CABG. The heart is mildly enlarged. There is no significant pericardial effusion. Mediastinum/Nodes: There are no enlarged mediastinal, hilar or axillary lymph nodes.Small chronic calcified mediastinal and hilar lymph nodes are present. The thyroid gland, trachea and esophagus demonstrate no significant findings. Lungs/Pleura: Trace bilateral pleural effusions. No pneumothorax. There is diffuse subpleural reticulation in both lungs with patchy ground-glass opacities, greatest in the upper lobes and left lower lobe. There are calcified lower lobe granulomas bilaterally. Upper abdomen: No acute findings. There is mild contour irregularity of the liver. There are scattered calcified granulomas within the liver and spleen. No adrenal mass. Musculoskeletal/Chest wall: There is no chest wall mass or  suspicious osseous finding. Review of the MIP images confirms the above findings. IMPRESSION: 1. No evidence of acute pulmonary embolism or other acute vascular process. 2. Chronic lung disease with progressive subpleural reticulation and patchy ground-glass opacities compared with previous CT of 06/13/2018. These findings likely relate to the patient's recent COVID-19 infection (residual inflammation versus postinflammatory scarring). 3. Sequela of prior granulomatous disease. 4. Stable central enlargement of the pulmonary arteries consistent with pulmonary arterial hypertension. Coronary and Aortic Atherosclerosis (ICD10-I70.0). Electronically Signed   By: Richardean Sale M.D.   On: 09/18/2019 16:29   DG Chest Portable 1 View  Result Date: 09/29/2019 CLINICAL DATA:  Shortness of breath.  Recent COVID-19 positive EXAM: PORTABLE CHEST 1 VIEW COMPARISON:  September 21, 2019. FINDINGS: There is multifocal airspace opacity throughout the lungs bilaterally with the greatest degree of opacification in the left lower lobe. Compared to the previous study, there has been slight clearing on the left with essentially stable changes on the right. Heart is borderline enlarged with pulmonary vascularity normal. Patient is status post median sternotomy. No adenopathy. There is aortic atherosclerosis. Postoperative changes noted in the right shoulder. IMPRESSION: Multifocal airspace opacity with overall slightly less opacity on the left compared to the previous study. Currently the greatest degree of airspace opacity is in the left lower lobe region. Heart borderline enlarged, stable. Postoperative changes noted. No adenopathy. Aortic Atherosclerosis (ICD10-I70.0). Electronically Signed   By: Lowella Grip III M.D.   On: 10/13/2019 15:15      Enzo Bi MD [facility]  If 7PM-7AM, please contact night-coverage 09/19/2019, 7:12 PM

## 2019-10-08 NOTE — ED Provider Notes (Addendum)
[facility] Emergency Department Provider Note  ____________________________________________   First MD Initiated Contact with Patient 09/29/2019 1458     (approximate)  I have reviewed the triage vital signs and the nursing notes.   HISTORY  Chief Complaint Shortness of Breath    HPI Chase Cabrera is a 74 y.o. male with CHF, CAD, Crohn's disease, diabetes, GERD, hypertension, hyperlipidemia, A. fib, coronavirus +3 weeks ago who presents for shortness of breath.  Patient was admitted from 12/27-1/12 for the coronavirus.  Patient was covid virus positive on 12/22.  Patient was found to be 79% on room air.  Patient temperature was 99.7 with a glucose of 347.  Per patient he had a fever of 103 this morning and had an oxygen level of 85% on his 3 L.  Patient states that he was discharged on 3 L from the hospital.  Patient was transported on a nonrebreather but is now on 6 L satting 92%.  Patient states that he has felt more short of breath today, constant, worse with exertion, better at rest.  He does also endorse having some worsening dizziness over the past few days where he is almost fallen but has not hit his head.  He denies any abdominal pain or urinary symptoms.  He denies any chest pain.          Past Medical History:  Diagnosis Date  . Arthritis   . Atrial fibrillation ([place])   . CAD (coronary artery disease)    6 STENTS  . CHF (congestive heart failure) ([place])   . Chicken pox   . Colon polyp   . Corneal dystrophy    bilateral  . COVID-19    09/09/19   . Crohn's disease ([place])   . Diabetes ([place]) 2002  . Dysrhythmia    A-FIB AND PAF  . GERD (gastroesophageal reflux disease)   . Gout   . History of kidney stones   . History of shingles   . Hyperlipemia   . Hypertension    2/2 b/l RAS >60% [place] 06/2018 s/p right stent   . Kidney stones   . Myocardial infarction ([place])    x4, last one in 2010  . Peripheral neuropathy   . Peripheral neuropathy    . Peripheral neuropathy   . PUD (peptic ulcer disease)   . Rectus sheath hematoma    02/18/18 8.9 x 4.5 cm then 02/19/18 7.9 x 4.5 cm   . Renal artery stenosis ([place])   . Renal artery stenosis ([place])   . Salzmann's nodular dystrophy 2012  . Sleep apnea   . Spinal stenosis     Patient Active Problem List   Diagnosis Date Noted  . Pressure injury of skin 09/12/2019  . Pneumonia due to COVID-19 virus 09/09/2019  . Acute respiratory failure with hypoxia ([place]) 09/05/2019  . AKI (acute kidney injury) ([place]) 09/05/2019  . GERD (gastroesophageal reflux disease) 09/05/2019  . Hyponatremia 09/05/2019  . COVID-19 09/04/2019  . Chronic diastolic CHF (congestive heart failure) ([place]) 07/05/2019  . Dementia ([place]) 02/15/2019  . BPH (benign prostatic hyperplasia) 02/15/2019  . STEMI (ST elevation myocardial infarction) ([place]) 09/16/2018  . Acute ST elevation myocardial infarction (STEMI) of lateral wall ([place]) 09/16/2018  . Diarrhea 08/09/2018  . Allergic rhinitis 08/08/2018  . Bronchiectasis without complication ([place]) 30/03/6225  . TIA (transient ischemic attack) 04/30/2018  . Abnormal thyroid function test 04/03/2018  . Leukocytosis 04/03/2018  . Hematoma of rectus sheath 02/21/2018  . Carotid artery stenosis  02/16/2018  . Altered mental status 02/09/2018  . Hypokalemia 01/20/2018  . OSA (obstructive sleep apnea) 01/12/2018  . Osteoarthritis of right hip 12/13/2017  . Intraabdominal mass 11/23/2017  . Colonic mass 11/18/2017  . Crohn's disease ([place]) 10/20/2017  . Pulmonary nodule 10/07/2017  . History of skin cancer 10/07/2017  . Rib pain on right side 03/07/2017  . Postnasal drip 09/09/2016  . Osteoarthritis 04/08/2016  . Spinal stenosis, lumbar 03/25/2016  . DM type 2 with diabetic peripheral neuropathy ([place]) 07/15/2014  . Gout 05/04/2013  . Renal artery stenosis ([place]) 07/01/2011  . Atrial fibrillation ([place]) 06/30/2011  . CAD (coronary artery disease) 06/30/2011  . Hyperlipidemia  06/30/2011  . Essential hypertension 06/30/2011    Past Surgical History:  Procedure Laterality Date  . ABLATION  2012  . APPLICATION VERTERBRAL DEFECT PROSTHETIC  05/01/2013  . ARTHRODESIS ANTERIOR LUMBAR SPINE  05/01/2013  . BACK SURGERY     lumbar fusion  . CARDIAC CATHETERIZATION    . CARDIAC ELECTROPHYSIOLOGY STUDY AND ABLATION    . CARDIAC SURGERY    . CARDIOVERSION    . CHOLECYSTECTOMY    . COLONOSCOPY WITH PROPOFOL N/A 04/09/2016   Completed for chronic diarrhea.  Few scattered diverticuli.  No mucosal lesions.  Surgeon: Lollie Sails, MD;  Location: [facility] ENDOSCOPY;  Service: Endoscopy;  Laterality: N/A;  . CORNEAL EYE SURGERY Bilateral 07/28/11    09/22/2011  . CORONARY ANGIOPLASTY    . CORONARY ANGIOPLASTY WITH STENT PLACEMENT  03/28/2015   Distal 80% to normal Stent, Dilation Balloon  . CORONARY ARTERY BYPASS GRAFT     4 VESSELS  . CORONARY/GRAFT ACUTE MI REVASCULARIZATION N/A 09/16/2018   Procedure: Coronary/Graft Acute MI Revascularization;  Surgeon: Isaias Cowman, MD;  Location: [place] [facility];  Service: Cardiovascular;  Laterality: N/A;  . EYE SURGERY     bilateral cataract 2017 Dr. Megan Mans  . foot and ankle repair Right 2005  . FRACTURE SURGERY     ANKLE PLATE AND SCREWS  . GALLBLADER    . JOINT REPLACEMENT     left total hip 11/11/15  . JOINT REPLACEMENT     right total hip 12/2017 Dr. Rudene Christians   . KNEE ARTHROSCOPY Right   . LEFT HEART CATH AND CORONARY ANGIOGRAPHY N/A 09/16/2018   Procedure: LEFT HEART CATH AND CORONARY ANGIOGRAPHY;  Surgeon: Isaias Cowman, MD;  Location: [place] [facility];  Service: Cardiovascular;  Laterality: N/A;  . LUMBAR SPINE FUSION ONE LEVEL  05/03/2013  . RENAL ARTERY STENT Right   . ROTATOR CUFF REPAIR Right 2006  . TONSILLECTOMY    . TOTAL HIP ARTHROPLASTY Left 11/11/2015   Procedure: TOTAL HIP ARTHROPLASTY ANTERIOR APPROACH;  Surgeon: Hessie Knows, MD;  Location: [facility] ORS;  Service: Orthopedics;  Laterality:  Left;  . TOTAL HIP ARTHROPLASTY Right 12/13/2017   Procedure: TOTAL HIP ARTHROPLASTY ANTERIOR APPROACH;  Surgeon: Hessie Knows, MD;  Location: [facility] ORS;  Service: Orthopedics;  Laterality: Right;  . TRANSCATH PLACEMENT INTRAVASCULAR STENT LEG  03/2015    Prior to Admission medications   Medication Sig Start Date End Date Taking? Authorizing Provider  Alpha-Lipoic Acid 600 MG CAPS Take 1 capsule (600 mg total) by mouth 2 (two) times daily. 10/02/19   McLean-Scocuzza, Nino Glow, MD  amitriptyline (ELAVIL) 50 MG tablet Take 1 tablet (50 mg total) by mouth at bedtime as needed. 10/03/19   McLean-Scocuzza, Nino Glow, MD  amLODipine (NORVASC) 5 MG tablet Take 1 tablet (5 mg total) by mouth 2 (two) times  daily. If blood pressure >140/>90 make take another 1 pill of 5 mg 05/01/18   Mayo, Pete Pelt, MD  aspirin EC 81 MG EC tablet Take 1 tablet (81 mg total) by mouth daily. 05/02/18   Mayo, Pete Pelt, MD  carvedilol (COREG) 25 MG tablet Take 1 tablet (25 mg total) by mouth 2 (two) times daily with a meal. 03/27/19   McLean-Scocuzza, Nino Glow, MD  Cholecalciferol 1.25 MG (50000 UT) TABS Take 1 tablet by mouth once a week. 07/12/19   McLean-Scocuzza, Nino Glow, MD  cloNIDine (CATAPRES) 0.2 MG tablet Take 1 tablet (0.2 mg total) by mouth 2 (two) times daily. 07/11/19   McLean-Scocuzza, Nino Glow, MD  clopidogrel (PLAVIX) 75 MG tablet Take 75 mg by mouth daily.    [provider]  colchicine 0.6 MG tablet TAKE 1 TABLET BY MOUTH ONCE DAILY Patient taking differently: Take 0.6 mg by mouth daily.  07/12/17   Leone Haven, MD  diclofenac sodium (VOLTAREN) 1 % GEL Apply 2 g topically 4 (four) times daily as needed (for pain).     [provider]  dicyclomine (BENTYL) 10 MG capsule Take 10 mg by mouth 3 (three) times daily before meals.     [provider]  fluticasone (FLONASE) 50 MCG/ACT nasal spray Place 2 sprays into both nostrils daily. 05/10/19   McLean-Scocuzza, Nino Glow, MD  furosemide  (LASIX) 40 MG tablet Take 2 tablets (80 mg total) by mouth every other day. 09/25/19 09/24/20  Patrecia Pour, MD  gabapentin (NEURONTIN) 300 MG capsule take 2 capsules (681m) by mouth three times a day 10/02/19   McLean-Scocuzza, TNino Glow MD  hydrALAZINE (APRESOLINE) 100 MG tablet TAKE 100 MG BY MOUTH THREE TIMES A DAY 01/02/19   McLean-Scocuzza, TNino Glow MD  insulin lispro (HUMALOG) 100 UNIT/ML KiwkPen Inject 14-32 Units into the skin 3 (three) times daily. (according to sliding scale - max 96u over 24 hours)    [provider]  ipratropium (ATROVENT) 0.06 % nasal spray Place 2 sprays into both nostrils 4 (four) times daily. 02/09/18   McLean-Scocuzza, TNino Glow MD  isosorbide mononitrate (IMDUR) 120 MG 24 hr tablet TAKE 1 TABLET BY MOUTH EVERY DAY Patient taking differently: Take 120 mg by mouth daily.  10/07/17   McLean-Scocuzza, TNino Glow MD  levocetirizine (XYZAL) 5 MG tablet Take 1 tablet (5 mg total) by mouth every evening. 02/09/18   McLean-Scocuzza, TNino Glow MD  montelukast (SINGULAIR) 10 MG tablet Take 1 tablet (10 mg total) by mouth at bedtime. 07/03/19   McLean-Scocuzza, TNino Glow MD  Multiple Vitamin (MULTI-VITAMINS) TABS Take 1 tablet by mouth daily. 04/15/09   [provider]  nitroGLYCERIN (NITROSTAT) 0.4 MG SL tablet Place 1 tablet (0.4 mg total) under the tongue every 5 (five) minutes x 3 doses as needed. For chest pain.  Call 911 if no relief after 3 tablets 10/03/17 11/02/21  McLean-Scocuzza, TNino Glow MD  Olopatadine HCl 0.6 % SOLN Place 2 sprays into the nose 2 (two) times daily. 06/11/19   GTyler Pita MD  omega-3 acid ethyl esters (LOVAZA) 1 g capsule Take 2 capsules (2 g total) by mouth 2 (two) times daily. 05/12/18   McLean-Scocuzza, TNino Glow MD  omeprazole (PRILOSEC) 40 MG capsule Take 40 mg by mouth 2 (two) times daily.     [provider]  pravastatin (PRAVACHOL) 40 MG tablet Take 1 tablet (40 mg total) by mouth daily. 03/28/19   McLean-Scocuzza, TNino Glow  MD  probenecid (BENEMID) 500 MG tablet Take 1,000 mg by mouth 2 (two) times daily. 09/03/19   [provider]  sodium chloride HYPERTONIC 3 % nebulizer solution Take by nebulization 2 (two) times daily. 06/14/18   Juanito Doom, MD  spironolactone (ALDACTONE) 25 MG tablet Take 0.5 tablets (12.5 mg total) by mouth daily. 09/25/19 09/24/20  Patrecia Pour, MD  TRESIBA FLEXTOUCH 200 UNIT/ML SOPN Inject 90 Units into the skin at bedtime. 04/25/18   [provider]  vitamin C (ASCORBIC ACID) 500 MG tablet Take 500 mg by mouth daily.     [provider]  Vitamin E 400 units TABS Take 400 Units by mouth 2 (two) times daily.    [provider]    Allergies Allopurinol, Atenolol, Atorvastatin, Ramipril, Rosuvastatin, Simvastatin, Valsartan, and Cardizem [diltiazem]  Family History  Problem Relation Age of Onset  . CVA Father   . Stroke Father   . Lung cancer Mother   . Arthritis Mother   . Stomach cancer Sister   . Colon cancer Sister   . Diabetes Brother     Social History Social History   Tobacco Use  . Smoking status: Former Smoker    Packs/day: 1.00    Years: 3.00    Pack years: 3.00    Types: Cigarettes    Quit date: 06/30/1962    Years since quitting: 57.3  . Smokeless tobacco: Former Systems developer    Types: Chew    Quit date: 12/05/1968  Substance Use Topics  . Alcohol use: No  . Drug use: No      Review of Systems Constitutional: Positive for fevers Eyes: No visual changes. ENT: No sore throat. Cardiovascular: No chest pain Respiratory: Positive for SOB Gastrointestinal: No abdominal pain.  No nausea, no vomiting.  No diarrhea.  No constipation. Genitourinary: Negative for dysuria. Musculoskeletal: Negative for back pain. Skin: Negative for rash. Neurological: Negative for headaches, focal weakness or numbness. All other ROS negative ____________________________________________   PHYSICAL EXAM:  VITAL SIGNS: Blood pressure 107/80,  pulse (!) 53, resp. rate 18, height 5' 8"  (1.727 m), weight 75.8 kg, SpO2 93 %.   Constitutional: Alert and oriented. Eyes: Conjunctivae are normal. EOMI. Head: Atraumatic. Nose: No congestion/rhinnorhea. Mouth/Throat: Mucous membranes are moist.   Neck: No stridor. Trachea Midline. FROM Cardiovascular: Normal rate, regular rhythm. Grossly normal heart sounds.  Good peripheral circulation. Respiratory: Mild increased work of breathing with crackles bilaterally. On 6L  Gastrointestinal: Soft and nontender. No distention. No abdominal bruits.  Musculoskeletal: No lower extremity tenderness nor edema.  No joint effusions. Neurologic:  Normal speech and language. No gross focal neurologic deficits are appreciated.  Skin:  Skin is warm, dry and intact. No rash noted. Psychiatric: Mood and affect are normal. Speech and behavior are normal. GU: Deferred   ____________________________________________   LABS (all labs ordered are listed, but only abnormal results are displayed)  Labs Reviewed  CBC WITH DIFFERENTIAL/PLATELET - Abnormal; Notable for the following components:      Result Value   RBC 3.76 (*)    HCT 38.0 (*)    MCV 101.1 (*)    MCH 34.8 (*)    Monocytes Absolute 1.3 (*)    All other components within normal limits  COMPREHENSIVE METABOLIC PANEL - Abnormal; Notable for the following components:   Sodium 131 (*)    Potassium 3.3 (*)    Chloride 93 (*)    Glucose, Bld 314 (*)    Calcium 8.6 (*)  Albumin 3.0 (*)    AST 56 (*)    Total Bilirubin 1.3 (*)    All other components within normal limits  URINALYSIS, ROUTINE W REFLEX MICROSCOPIC - Abnormal; Notable for the following components:   Color, Urine YELLOW (*)    APPearance CLEAR (*)    Glucose, UA >=500 (*)    All other components within normal limits  BRAIN NATRIURETIC PEPTIDE - Abnormal; Notable for the following components:   B Natriuretic Peptide 679.0 (*)    All other components within normal limits    CULTURE, BLOOD (ROUTINE X 2)  CULTURE, BLOOD (ROUTINE X 2)  URINE CULTURE  LACTIC ACID, PLASMA  PROCALCITONIN  LACTIC ACID, PLASMA  C-REACTIVE PROTEIN  PROCALCITONIN  TROPONIN I (HIGH SENSITIVITY)  TROPONIN I (HIGH SENSITIVITY)   ____________________________________________   ED ECG REPORT I, Vanessa [place], the attending physician, personally viewed and interpreted this ECG.  Sinus brady rate of 53, no st eleation, no twi, normal intervals.  ____________________________________________  [place], personally viewed and evaluated these images (plain radiographs) as part of my medical decision making, as well as reviewing the written report by the radiologist.  ED MD interpretation: Multifocal opacities  Official radiology report(s): DG Chest Portable 1 View  Result Date: 10/07/2019 CLINICAL DATA:  Shortness of breath.  Recent COVID-19 positive EXAM: PORTABLE CHEST 1 VIEW COMPARISON:  September 21, 2019. FINDINGS: There is multifocal airspace opacity throughout the lungs bilaterally with the greatest degree of opacification in the left lower lobe. Compared to the previous study, there has been slight clearing on the left with essentially stable changes on the right. Heart is borderline enlarged with pulmonary vascularity normal. Patient is status post median sternotomy. No adenopathy. There is aortic atherosclerosis. Postoperative changes noted in the right shoulder. IMPRESSION: Multifocal airspace opacity with overall slightly less opacity on the left compared to the previous study. Currently the greatest degree of airspace opacity is in the left lower lobe region. Heart borderline enlarged, stable. Postoperative changes noted. No adenopathy. Aortic Atherosclerosis (ICD10-I70.0). Electronically Signed   By: Lowella Grip III M.D.   On: 10/03/2019 15:15    ____________________________________________   PROCEDURES  Procedure(s) performed (including Critical  Care):  .Critical Care Performed by: Vanessa [place], MD Authorized by: Vanessa [place], MD   Critical care provider statement:    Critical care time (minutes):  35   Critical care was necessary to treat or prevent imminent or life-threatening deterioration of the following conditions:  Respiratory failure   Critical care was time spent personally by me on the following activities:  Discussions with consultants, evaluation of patient's response to treatment, examination of patient, ordering and performing treatments and interventions, ordering and review of laboratory studies, ordering and review of radiographic studies, pulse oximetry, re-evaluation of patient's condition, obtaining history from patient or surrogate and review of old charts     ____________________________________________   INITIAL IMPRESSION / ASSESSMENT AND PLAN / ED COURSE   [name] was evaluated in Emergency Department on 10/03/2019 for the symptoms described in the history of present illness. He was evaluated in the context of the global COVID-19 pandemic, which necessitated consideration that the patient might be at risk for infection with the SARS-CoV-2 virus that causes COVID-19. Institutional protocols and algorithms that pertain to the evaluation of patients at risk for COVID-19 are in a state of rapid change based on information released by regulatory bodies including the CDC and federal and state organizations. These  policies and algorithms were followed during the patient's care in the ED.     Pt presents with SOB.  Patient is 1 month post Covid but now having worsening oxygen requirement and recurrent fevers.  This is most concerning for bacterial infection versus PE.  Will get CT PE to further evaluate.  Given patient did hit his head will get CT imaging of his head.  Patient denies any abdominal tenderness to suggest that as a source of infection.  Patient placed on 6 L of oxygen due to significant  hypoxia.  PNA-will get xray to evaluation Anemia-CBC to evaluate ACS- will get trops Arrhythmia-Will get EKG and keep on monitor.   X-ray shows multifocal opacities.   5:04 PM reevaluated patient.  Continues to have no abdominal tenderness.  CT PE is negative for pulmonary embolism but there is patchy groundglass opacities.  CT head was negative.  Patient's rectal temperature was 100.7.  Patient is currently on 6 L of oxygen.  Discussed with the hospital team for admission for worsening oxygen saturation.  Will cover with broad-spectrum antibiotics in case this could be a bacterial pneumonia.  We will give 500 cc of fluid.  We will hold off on full fluid resuscitation given his BNP is elevated and he is not hypotensive or tachycardic at this time.    ____________________________________________   FINAL CLINICAL IMPRESSION(S) / ED DIAGNOSES   Final diagnoses:  Acute respiratory failure with hypoxia (HCC)  Fever, unspecified fever cause     MEDICATIONS GIVEN DURING THIS VISIT:  Medications  vancomycin (VANCOCIN) IVPB 1000 mg/200 mL premix (has no administration in time range)  ceFEPIme (MAXIPIME) 2 g in sodium chloride 0.9 % 100 mL IVPB (has no administration in time range)  iohexol (OMNIPAQUE) 350 MG/ML injection 75 mL (75 mLs Intravenous Contrast Given 10/06/2019 1606)     ED Discharge Orders    None       Note:  This document was prepared using Dragon voice recognition software and may include unintentional dictation errors.   Vanessa [place], MD 10/11/2019 1716    Vanessa [place], MD 09/24/2019 [phone]

## 2019-10-08 NOTE — Consult Note (Signed)
PHARMACY -  BRIEF ANTIBIOTIC NOTE   Pharmacy has received consult(s) for Cefepime/Vancomycin from an ED provider.  The patient's profile has been reviewed for ht/wt/allergies/indication/available labs.    One time order(s) placed for Vancomycin 1g IV x 1 and Cefepime 2g IV x 1 by ED provider  An additional 587m Vancomycin ordered to complete 15034mloading dose  Further antibiotics/pharmacy consults should be ordered by admitting physician if indicated.                       Thank you,  ChLu DuffelPharmD, BCPS Clinical Pharmacist 09/20/2019 5:18 PM

## 2019-10-08 NOTE — ED Triage Notes (Signed)
[facility] for shortness of breath.  + covid 3 weeks ago.  From home, found 79% on room air.  Hx of chf, diabetes.  117/57.  100% on NRB.   CBG 347.  Temp 99.7

## 2019-10-08 NOTE — Telephone Encounter (Signed)
Did this get transferred to you since you are here.

## 2019-10-09 ENCOUNTER — Telehealth: Payer: Self-pay | Admitting: Internal Medicine

## 2019-10-09 DIAGNOSIS — U071 COVID-19: Secondary | ICD-10-CM

## 2019-10-09 DIAGNOSIS — J9601 Acute respiratory failure with hypoxia: Secondary | ICD-10-CM

## 2019-10-09 LAB — CBC
HCT: 34.4 % — ABNORMAL LOW (ref 39.0–52.0)
Hemoglobin: 11.8 g/dL — ABNORMAL LOW (ref 13.0–17.0)
MCH: 35 pg — ABNORMAL HIGH (ref 26.0–34.0)
MCHC: 34.3 g/dL (ref 30.0–36.0)
MCV: 102.1 fL — ABNORMAL HIGH (ref 80.0–100.0)
Platelets: 271 10*3/uL (ref 150–400)
RBC: 3.37 MIL/uL — ABNORMAL LOW (ref 4.22–5.81)
RDW: 14.2 % (ref 11.5–15.5)
WBC: 11.8 10*3/uL — ABNORMAL HIGH (ref 4.0–10.5)
nRBC: 0 % (ref 0.0–0.2)

## 2019-10-09 LAB — BASIC METABOLIC PANEL
Anion gap: 9 (ref 5–15)
BUN: 15 mg/dL (ref 8–23)
CO2: 29 mmol/L (ref 22–32)
Calcium: 8.1 mg/dL — ABNORMAL LOW (ref 8.9–10.3)
Chloride: 94 mmol/L — ABNORMAL LOW (ref 98–111)
Creatinine, Ser: 1.1 mg/dL (ref 0.61–1.24)
GFR calc Af Amer: >60 mL/min (ref >60)
GFR calc non Af Amer: >60 mL/min (ref >60)
Glucose, Bld: 338 mg/dL — ABNORMAL HIGH (ref 70–99)
Potassium: 2.9 mmol/L — ABNORMAL LOW (ref 3.5–5.1)
Sodium: 132 mmol/L — ABNORMAL LOW (ref 135–145)

## 2019-10-09 LAB — GLUCOSE, CAPILLARY
Glucose-Capillary: 171 mg/dL — ABNORMAL HIGH (ref 70–99)
Glucose-Capillary: 180 mg/dL — ABNORMAL HIGH (ref 70–99)
Glucose-Capillary: 240 mg/dL — ABNORMAL HIGH (ref 70–99)
Glucose-Capillary: 284 mg/dL — ABNORMAL HIGH (ref 70–99)

## 2019-10-09 LAB — PROCALCITONIN: Procalcitonin: 0.16 ng/mL

## 2019-10-09 LAB — LACTIC ACID, PLASMA: Lactic Acid, Venous: 1.5 mmol/L (ref 0.5–1.9)

## 2019-10-09 LAB — MAGNESIUM: Magnesium: 1.4 mg/dL — ABNORMAL LOW (ref 1.7–2.4)

## 2019-10-09 MED ORDER — ENOXAPARIN SODIUM 40 MG/0.4ML ~~LOC~~ SOLN
40.0000 mg | SUBCUTANEOUS | Status: DC
  Administered 2019-10-09 – 2019-10-13 (×5): 40 mg via SUBCUTANEOUS
  Filled 2019-10-09 (×5): qty 0.4

## 2019-10-09 MED ORDER — METHYLPREDNISOLONE SODIUM SUCC 40 MG IJ SOLR
40.0000 mg | Freq: Three times a day (TID) | INTRAMUSCULAR | Status: DC
  Administered 2019-10-09 – 2019-10-14 (×16): 40 mg via INTRAVENOUS
  Filled 2019-10-09 (×17): qty 1

## 2019-10-09 MED ORDER — MAGNESIUM SULFATE 2 GM/50ML IV SOLN
2.0000 g | Freq: Once | INTRAVENOUS | Status: AC
  Administered 2019-10-09: 04:00:00 2 g via INTRAVENOUS
  Filled 2019-10-09: qty 50

## 2019-10-09 MED ORDER — POTASSIUM CHLORIDE 10 MEQ/100ML IV SOLN
10.0000 meq | INTRAVENOUS | Status: AC
  Administered 2019-10-09 (×6): 10 meq via INTRAVENOUS
  Filled 2019-10-09 (×6): qty 100

## 2019-10-09 MED ORDER — CHLORHEXIDINE GLUCONATE CLOTH 2 % EX PADS
6.0000 | MEDICATED_PAD | Freq: Every day | CUTANEOUS | Status: DC
  Administered 2019-10-09: 05:00:00 6 via TOPICAL

## 2019-10-09 MED ORDER — IPRATROPIUM-ALBUTEROL 20-100 MCG/ACT IN AERS
2.0000 | INHALATION_SPRAY | RESPIRATORY_TRACT | Status: DC
  Administered 2019-10-09 – 2019-10-10 (×8): 2 via RESPIRATORY_TRACT
  Filled 2019-10-09 (×2): qty 4

## 2019-10-09 MED ORDER — SODIUM CHLORIDE 0.9 % IV SOLN
INTRAVENOUS | Status: DC | PRN
  Administered 2019-10-09: 250 mL via INTRAVENOUS

## 2019-10-09 NOTE — Telephone Encounter (Signed)
Rodena Piety from [facility] [place], [phone].  Unable to see him last week and unable to see him this week, patient is in hospital.

## 2019-10-09 NOTE — Consult Note (Signed)
Name: Chase Cabrera MRN: [id number] DOB: Jun 12, 1946     CONSULTATION DATE: 10/09/2019 REFERRING MD : Sharyne Peach  CHIEF COMPLAINT: REsp distress  STUDIES:   CT chest Independently reviewed by Me Severe b/l infiltrates, post inflammatory fibrosis    HISTORY OF PRESENT ILLNESS: 74 yo WM with previous h/o COVID-19  Infection transferred to [place] for progressive hypoxia CT chest reviewed extensive pneumonitis and inflammation Patient being treated with IV abx and IV steroids  Patient is alert and awake, NAD 10L [place] wall high  Flow 02 sats 92%  Patient needs to move around more and use incentive spirometry Patient is hard of hearing.  He is not in any distress  COVID + 08/2019    PAST MEDICAL HISTORY :   has a past medical history of Arthritis, Atrial fibrillation ([place]), CAD (coronary artery disease), CHF (congestive heart failure) ([place]), Chicken pox, Colon polyp, Corneal dystrophy, COVID-19, Crohn's disease ([place]), Diabetes ([place]) (2002), Dysrhythmia, GERD (gastroesophageal reflux disease), Gout, History of kidney stones, History of shingles, Hyperlipemia, Hypertension, Kidney stones, Myocardial infarction [facility]), Peripheral neuropathy, Peripheral neuropathy, Peripheral neuropathy, PUD (peptic ulcer disease), Rectus sheath hematoma, Renal artery stenosis ([place]), Renal artery stenosis ([place]), Salzmann's nodular dystrophy (2012), Sleep apnea, and Spinal stenosis.  has a past surgical history that includes GALLBLADER; Cardiac catheterization; Coronary angioplasty; Coronary angioplasty with stent (03/28/2015); Cardiac electrophysiology study and ablation; Cardioversion; Back surgery; Total hip arthroplasty (Left, 11/11/2015); Rotator cuff repair (Right, 2006); Renal artery stent (Right); foot and ankle repair (Right, 2005); Tonsillectomy; Knee arthroscopy (Right); Colonoscopy with propofol (N/A, 04/09/2016); Fracture surgery; APPLICATION VERTERBRAL DEFECT PROSTHETIC (05/01/2013); ARTHRODESIS ANTERIOR  LUMBAR SPINE (05/01/2013); Cholecystectomy; CORNEAL EYE SURGERY (Bilateral, 07/28/11    09/22/2011); LUMBAR SPINE FUSION ONE LEVEL (05/03/2013); TRANSCATH PLACEMENT INTRAVASCULAR STENT LEG (03/2015); Coronary artery bypass graft; Cardiac surgery; Ablation (2012); Total hip arthroplasty (Right, 12/13/2017); Joint replacement; Joint replacement; Eye surgery; Coronary/Graft Acute MI Revascularization (N/A, 09/16/2018); and LEFT HEART CATH AND CORONARY ANGIOGRAPHY (N/A, 09/16/2018). Prior to Admission medications   Medication Sig Start Date End Date Taking? Authorizing Provider  Alpha-Lipoic Acid 600 MG CAPS Take 1 capsule (600 mg total) by mouth 2 (two) times daily. 10/02/19  Yes McLean-Scocuzza, Nino Glow, MD  amitriptyline (ELAVIL) 50 MG tablet Take 1 tablet (50 mg total) by mouth at bedtime as needed. 10/03/19  Yes McLean-Scocuzza, Nino Glow, MD  amLODipine (NORVASC) 5 MG tablet Take 1 tablet (5 mg total) by mouth 2 (two) times daily. If blood pressure >140/>90 make take another 1 pill of 5 mg 05/01/18  Yes Mayo, Pete Pelt, MD  aspirin EC 81 MG EC tablet Take 1 tablet (81 mg total) by mouth daily. 05/02/18  Yes Mayo, Pete Pelt, MD  carvedilol (COREG) 25 MG tablet Take 1 tablet (25 mg total) by mouth 2 (two) times daily with a meal. 03/27/19  Yes McLean-Scocuzza, Nino Glow, MD  Cholecalciferol 1.25 MG (50000 UT) TABS Take 1 tablet by mouth once a week. 07/12/19  Yes McLean-Scocuzza, Nino Glow, MD  cloNIDine (CATAPRES) 0.2 MG tablet Take 1 tablet (0.2 mg total) by mouth 2 (two) times daily. 07/11/19  Yes McLean-Scocuzza, Nino Glow, MD  clopidogrel (PLAVIX) 75 MG tablet Take 75 mg by mouth daily.   Yes [provider]  colchicine 0.6 MG tablet TAKE 1 TABLET BY MOUTH ONCE DAILY Patient taking differently: Take 0.6 mg by mouth daily.  07/12/17  Yes Leone Haven, MD  diclofenac sodium (VOLTAREN) 1 % GEL Apply 2 g topically 4 (four) times daily as  needed (for pain).    Yes [provider]  dicyclomine  (BENTYL) 10 MG capsule Take 10 mg by mouth 3 (three) times daily before meals.    Yes [provider]  fluticasone (FLONASE) 50 MCG/ACT nasal spray Place 2 sprays into both nostrils daily. 05/10/19  Yes McLean-Scocuzza, Nino Glow, MD  furosemide (LASIX) 40 MG tablet Take 2 tablets (80 mg total) by mouth every other day. 09/25/19 09/24/20 Yes Patrecia Pour, MD  gabapentin (NEURONTIN) 300 MG capsule take 2 capsules (626m) by mouth three times a day 10/02/19  Yes McLean-Scocuzza, TNino Glow MD  hydrALAZINE (APRESOLINE) 100 MG tablet TAKE 100 MG BY MOUTH THREE TIMES A DAY 01/02/19  Yes McLean-Scocuzza, TNino Glow MD  insulin lispro (HUMALOG) 100 UNIT/ML KiwkPen Inject 14-32 Units into the skin 3 (three) times daily. (according to sliding scale - max 96u over 24 hours)   Yes [provider]  isosorbide mononitrate (IMDUR) 120 MG 24 hr tablet TAKE 1 TABLET BY MOUTH EVERY DAY Patient taking differently: Take 120 mg by mouth daily.  10/07/17  Yes McLean-Scocuzza, TNino Glow MD  levocetirizine (XYZAL) 5 MG tablet Take 1 tablet (5 mg total) by mouth every evening. 02/09/18  Yes McLean-Scocuzza, TNino Glow MD  montelukast (SINGULAIR) 10 MG tablet Take 1 tablet (10 mg total) by mouth at bedtime. 07/03/19  Yes McLean-Scocuzza, TNino Glow MD  Multiple Vitamin (MULTI-VITAMINS) TABS Take 1 tablet by mouth daily. 04/15/09  Yes [provider]  nitroGLYCERIN (NITROSTAT) 0.4 MG SL tablet Place 1 tablet (0.4 mg total) under the tongue every 5 (five) minutes x 3 doses as needed. For chest pain.  Call 911 if no relief after 3 tablets 10/03/17 11/02/21 Yes McLean-Scocuzza, TNino Glow MD  omega-3 acid ethyl esters (LOVAZA) 1 g capsule Take 2 capsules (2 g total) by mouth 2 (two) times daily. 05/12/18  Yes McLean-Scocuzza, TNino Glow MD  omeprazole (PRILOSEC) 40 MG capsule Take 40 mg by mouth 2 (two) times daily.    Yes [provider]  pravastatin (PRAVACHOL) 40 MG tablet Take 1 tablet (40 mg total) by mouth daily.  03/28/19  Yes McLean-Scocuzza, TNino Glow MD  probenecid (BENEMID) 500 MG tablet Take 1,000 mg by mouth 2 (two) times daily. 09/03/19  Yes [provider]  sodium chloride HYPERTONIC 3 % nebulizer solution Take by nebulization 2 (two) times daily. 06/14/18  Yes MJuanito Doom MD  spironolactone (ALDACTONE) 25 MG tablet Take 0.5 tablets (12.5 mg total) by mouth daily. 09/25/19 09/24/20 Yes GPatrecia Pour MD  TRESIBA FLEXTOUCH 200 UNIT/ML SOPN Inject 90 Units into the skin at bedtime. 04/25/18  Yes [provider]  vitamin C (ASCORBIC ACID) 500 MG tablet Take 500 mg by mouth daily.    Yes [provider]  Vitamin E 400 units TABS Take 400 Units by mouth 2 (two) times daily.   Yes [provider]   Allergies  Allergen Reactions  . Allopurinol Diarrhea, Other (See Comments) and Nausea And Vomiting    Other Reaction: GI Upset  . Atenolol Other (See Comments)    Other Reaction: bradycardia  . Atorvastatin Other (See Comments)    Other Reaction: muscle aches  . Ramipril Rash  . Rosuvastatin Other (See Comments)    Muscle aches  . Simvastatin Other (See Comments)    Other Reaction: MUSCLE ACHES (Zocor)  . Valsartan Other (See Comments)    Other Reaction: facial swelling  . Cardizem [Diltiazem] Rash    FAMILY HISTORY:  family history includes Arthritis in his mother; CVA in his father; Colon cancer in his sister; Diabetes in his brother; Lung cancer in his mother; Stomach cancer in his sister; Stroke in his father. SOCIAL HISTORY:  reports that he quit smoking about 57 years ago. His smoking use included cigarettes. He has a 3.00 pack-year smoking history. He quit smokeless tobacco use about 50 years ago.  His smokeless tobacco use included chew. He reports that he does not drink alcohol or use drugs.    Review of Systems:  Gen:  Denies  fever, sweats, chills weigh loss  HEENT: Denies blurred vision, double vision, ear pain, eye pain, hearing loss, nose  bleeds, sore throat Cardiac:  No dizziness, chest pain or heaviness, chest tightness,edema, No JVD Resp:   No cough, -sputum production, -shortness of breath,-wheezing, -hemoptysis,  Gi: Denies swallowing difficulty, stomach pain, nausea or vomiting, diarrhea, constipation, bowel incontinence Gu:  Denies bladder incontinence, burning urine Ext:   Denies Joint pain, stiffness or swelling Skin: Denies  skin rash, easy bruising or bleeding or hives Endoc:  Denies polyuria, polydipsia , polyphagia or weight change Psych:   Denies depression, insomnia or hallucinations  Other:  All other systems negative     VITAL SIGNS: Temp:  [99.6 F (37.6 C)-100 F (37.8 C)] 100 F (37.8 C) (01/26 1600) Pulse Rate:  [45-60] 60 (01/26 1600) Resp:  [17-36] 35 (01/26 1600) BP: (99-140)/(47-65) 131/54 (01/26 1600) SpO2:  [89 %-96 %] 90 % (01/26 1600) Weight:  [79.4 kg] 79.4 kg (01/25 2250)     SpO2: 90 % O2 Flow Rate (L/min): 10 L/min    Physical Examination:   GENERAL:NAD, no fevers, chills, no weakness no fatigue HEAD: Normocephalic, atraumatic.  EYES: PERLA, EOMI No scleral icterus.  MOUTH: Moist mucosal membrane.  EAR, NOSE, THROAT: Clear without exudates. No external lesions.  NECK: Supple.  PULMONARY: CTA B/L + rhonchi, crackles CARDIOVASCULAR: S1 and S2. Regular rate and rhythm. No murmurs GASTROINTESTINAL: Soft, nontender, nondistended. Positive bowel sounds.  MUSCULOSKELETAL: No swelling, clubbing, or edema.  NEUROLOGIC: No gross focal neurological deficits. 5/5 strength all extremities SKIN: No ulceration, lesions, rashes, or cyanosis.  PSYCHIATRIC: Insight, judgment intact. -depression -anxiety ALL OTHER ROS ARE NEGATIVE   MEDICATIONS: I have reviewed all medications and confirmed regimen as documented    CULTURE RESULTS   Recent Results (from the past 240 hour(s))  Blood culture (routine x 2)     Status: None (Preliminary result)   Collection Time: 10/07/2019  2:59 PM    Specimen: BLOOD  Result Value Ref Range Status   Specimen Description BLOOD RIGHT ANTECUBITAL  Final   Special Requests   Final    BOTTLES DRAWN AEROBIC AND ANAEROBIC Blood Culture adequate volume   Culture   Final    NO GROWTH < 12 HOURS Performed at [facility], [address] [place], [place] [address]    Report Status PENDING  Incomplete  Blood culture (routine x 2)     Status: None (Preliminary result)   Collection Time: 09/15/2019  3:53 PM   Specimen: BLOOD  Result Value Ref Range Status   Specimen Description BLOOD BLOOD LEFT HAND  Final   Special Requests   Final    BOTTLES DRAWN AEROBIC AND ANAEROBIC Blood Culture adequate volume   Culture   Final    NO GROWTH < 12 HOURS Performed at [facility], [address] [place], [place] [address]    Report Status PENDING  Incomplete  SEVERE COVID !( POST INFECTIOUS PNEUMIONI   ASSESSMENT AND PLAN  SYNOPSIS SEVERE COVID-POST INFECTIOUS PNEUMONITIS AND POST INFLAMMATORY FIBROSIS-Patient with poor prognosis overall. Patient will need Long Term Oxygen support  Recommend Palliative Care consultation for Goals of Care Conversation   Severe COVID-19 infection,  pneumonia/pneumonitis Continue IV steroids  Aggressive pulm toilet recommended OOB to chair as tolerated  Pulmonary hygiene Continue proning as tolerated due to  hypoxia   bronchodilators (MDI) Vitamin C and zinc Antitussives    Patient can be transferred to Gen med floor.   Corrin Parker, M.D.  [facility]  Medical Director [place] Director [facility] Cardio-Pulmonary Department

## 2019-10-09 NOTE — Telephone Encounter (Signed)
Rodena Piety from [facility] [place], [phone].  Unable to see him last week and unable to see him this week, patient is in hospital.Willett Lefeber,cma

## 2019-10-09 NOTE — Progress Notes (Signed)
PROGRESS NOTE    Chase Cabrera  LXB:[id number] DOB: 11-21-1945 DOA: 10/06/2019 PCP: McLean-Scocuzza, Nino Glow, MD    Assessment & Plan:   Active Problems:   Acute respiratory failure with hypoxemia (HCC)    Chase Cabrera is a 74 y.o. Caucasian male with medical history significant of bronchiectasis, OSA not on CPAP, DM2 on insulin, MI, CAD status post multiple stents, HTN, gout who presented with worsening shortness of breath.  Pt was admitted to step-down due to already being on 6L and high risk of decompensating.   # Acute hypoxic respiratory failure # Recent COVID-19 infection # Hx of Bronchiectasis  --On presentation, had fever and needed 6L O2 to maintain sats in low 90%. --Dx of Covid on 09/04/19 and was discharged on 09/25/2019 after receiving treatment with remdesivir, Actemra, and Decadron which he completed on 1/23, and 3 L O2.  Was doing well until day of presentation.  DDx include sequelae of COVID, CHF, pulmonary HTN, Bronchiectasis exacerbation.  Superimposed PNA is less likely since procal is neg, and CT chest didn't show acute findings or consolidations but chronic lung disease findings.   --CRP 12.3, up from 1.3 on 1/12. PLAN: --continue suppl O2 to maintain sats >90% (O2 requirement had increased to 10L today) --Hold off abx --TTE --Start IV solu-medrol 40 mg q8h due to CRP elevation  --Considered diuretic, however, pt's low BP precludes that for now. --Pulm consult today  # Hx of Bronchiectasis  --Last seen pulm Dr. Patsey Berthold on 08/17/19.   --Pulm consult today  # Hx of OSA --Pt has declined CPAP in the past  # DM2 on insulin # Neuropathy --continue home Tresiba as 60u nightly (90u at home) --SSI TID --continue home gabapentin  # Hx of mild systolic CHF # Hx of pulm HTN --Last echo on 09/16/18 showed LVEF 45-50% --Hold home Lasix and other BP meds due to BP low on presentation.  Resume as BP allows. --TTE   # Hx of MI's and CAD s/p  stents --continue home ASA, plavix and statin  # HTN --Hold home BP meds due to BP low on presentation.  Resume as BP allows.  # Hx of gout --continue home colchicine and Probenecid  # GERD --continue home PPI   DVT prophylaxis: Lovenox SQ Code Status: Full code  Family Communication: not today Disposition Plan: too early to determine   Subjective and Interval History:  Overnight, O2 requirement went from 6L up to 10L  Pt complained about BP cuff near his IV site hurting his arm.  Seemed more confused and wasn't interested in answering ROS questions, but fixated on a few things that bothered him.   Objective: Vitals:   10/09/19 1500 10/09/19 1600 10/09/19 1700 10/09/19 1800  BP:  (!) 131/54  139/62  Pulse: (!) 59 60 (!) 59 (!) 58  Resp: (!) 36 (!) 35 (!) 38 (!) 28  Temp:  100 F (37.8 C)    TempSrc:  Oral    SpO2: 90% 90% 91% 94%  Weight:      Height:        Intake/Output Summary (Last 24 hours) at 10/09/2019 2032 Last data filed at 10/09/2019 1600 Gross per 24 hour  Intake 1154.79 ml  Output 900 ml  Net 254.79 ml   Filed Weights   10/11/2019 1513 09/16/2019 2250  Weight: 75.8 kg 79.4 kg    Examination:   Constitutional: NAD, alert, seemed more confused today HEENT: conjunctivae and lids normal, EOMI CV: RRR no  M,R,G. Distal pulses +2.  No cyanosis.   RESP: normal respiratory effort, on 10L GI: +BS, NTND Extremities: No effusions, edema, or tenderness in BLE SKIN: warm, dry and intact Neuro: II - XII grossly intact.  Sensation intact   Data Reviewed: I have personally reviewed following labs and imaging studies  CBC: Recent Labs  Lab 10/11/2019 1452 10/09/19 0051  WBC 9.8 11.8*  NEUTROABS 7.2  --   HGB 13.1 11.8*  HCT 38.0* 34.4*  MCV 101.1* 102.1*  PLT 275 010   Basic Metabolic Panel: Recent Labs  Lab 10/03/2019 1452 10/09/19 0051  NA 131* 132*  K 3.3* 2.9*  CL 93* 94*  CO2 28 29  GLUCOSE 314* 338*  BUN 13 15  CREATININE 1.05 1.10   CALCIUM 8.6* 8.1*  MG  --  1.4*   GFR: Estimated Creatinine Clearance: 56.1 mL/min (by C-G formula based on SCr of 1.1 mg/dL). Liver Function Tests: Recent Labs  Lab 09/26/2019 1452  AST 56*  ALT 25  ALKPHOS 51  BILITOT 1.3*  PROT 7.2  ALBUMIN 3.0*   No results for input(s): LIPASE, AMYLASE in the last 168 hours. No results for input(s): AMMONIA in the last 168 hours. Coagulation Profile: No results for input(s): INR, PROTIME in the last 168 hours. Cardiac Enzymes: No results for input(s): CKTOTAL, CKMB, CKMBINDEX, TROPONINI in the last 168 hours. BNP (last 3 results) No results for input(s): PROBNP in the last 8760 hours. HbA1C: No results for input(s): HGBA1C in the last 72 hours. CBG: Recent Labs  Lab 09/26/2019 2246 10/09/19 0804 10/09/19 1205 10/09/19 1623  GLUCAP 249* 171* 180* 240*   Lipid Profile: No results for input(s): CHOL, HDL, LDLCALC, TRIG, CHOLHDL, LDLDIRECT in the last 72 hours. Thyroid Function Tests: No results for input(s): TSH, T4TOTAL, FREET4, T3FREE, THYROIDAB in the last 72 hours. Anemia Panel: No results for input(s): VITAMINB12, FOLATE, FERRITIN, TIBC, IRON, RETICCTPCT in the last 72 hours. Sepsis Labs: Recent Labs  Lab 09/30/2019 1459 09/30/2019 1600 10/09/19 0039 10/09/19 0051  PROCALCITON <0.10  --   --  0.16  LATICACIDVEN  --  1.9 1.5  --     Recent Results (from the past 240 hour(s))  Blood culture (routine x 2)     Status: None (Preliminary result)   Collection Time: 10/11/2019  2:59 PM   Specimen: BLOOD  Result Value Ref Range Status   Specimen Description BLOOD RIGHT ANTECUBITAL  Final   Special Requests   Final    BOTTLES DRAWN AEROBIC AND ANAEROBIC Blood Culture adequate volume   Culture   Final    NO GROWTH < 12 HOURS Performed at [facility], [address] [place], [place] [address]    Report Status PENDING  Incomplete  Blood culture (routine x 2)     Status: None (Preliminary result)   Collection Time:  09/16/2019  3:53 PM   Specimen: BLOOD  Result Value Ref Range Status   Specimen Description BLOOD BLOOD LEFT HAND  Final   Special Requests   Final    BOTTLES DRAWN AEROBIC AND ANAEROBIC Blood Culture adequate volume   Culture   Final    NO GROWTH < 12 HOURS Performed at [facility], [address], [place],  [address]    Report Status PENDING  Incomplete      Radiology Studies: CT Head Wo Contrast  Result Date: 09/29/2019 CLINICAL DATA:  Headache and shortness of breath. EXAM: CT HEAD WITHOUT CONTRAST TECHNIQUE: Contiguous axial images were  obtained from the base of the skull through the vertex without intravenous contrast. COMPARISON:  Head CT 06/13/2018 FINDINGS: Brain: Stable age related cerebral atrophy, ventriculomegaly and periventricular white matter disease. No extra-axial fluid collections are identified. No CT findings for acute hemispheric infarction or intracranial hemorrhage. No mass lesions. The brainstem and cerebellum are normal. Vascular: No hyperdense vessels or aneurysm. Stable moderate vascular calcifications. Skull: No skull fracture or bone lesion. Sinuses/Orbits: The paranasal sinuses and mastoid air cells are clear. The globes are intact. Other: No scalp lesions or hematoma. IMPRESSION: 1. Stable age related cerebral atrophy, ventriculomegaly and periventricular white matter disease. 2. No acute intracranial findings or mass lesions. Electronically Signed   By: Marijo Sanes M.D.   On: 10/07/2019 16:20   CT Angio Chest PE W and/or Wo Contrast  Result Date: 09/24/2019 CLINICAL DATA:  Shortness of breath and hypoxemia. Diagnosed with COVID-19 infection 3 weeks ago. History of diabetes and congestive heart failure. EXAM: CT ANGIOGRAPHY CHEST WITH CONTRAST TECHNIQUE: Multidetector CT imaging of the chest was performed using the standard protocol during bolus administration of intravenous contrast. Multiplanar CT image reconstructions and MIPs were obtained to  evaluate the vascular anatomy. CONTRAST:  19m OMNIPAQUE IOHEXOL 350 MG/ML SOLN COMPARISON:  Chest CT 06/13/2018. FINDINGS: Cardiovascular: The pulmonary arteries are well opacified with contrast to the level of the subsegmental branches. There is no evidence of acute pulmonary embolism. There is stable central enlargement of the pulmonary arteries consistent with pulmonary arterial hypertension. There is diffuse atherosclerosis of the aorta, great vessels and coronary arteries status post median sternotomy and CABG. The heart is mildly enlarged. There is no significant pericardial effusion. Mediastinum/Nodes: There are no enlarged mediastinal, hilar or axillary lymph nodes.Small chronic calcified mediastinal and hilar lymph nodes are present. The thyroid gland, trachea and esophagus demonstrate no significant findings. Lungs/Pleura: Trace bilateral pleural effusions. No pneumothorax. There is diffuse subpleural reticulation in both lungs with patchy ground-glass opacities, greatest in the upper lobes and left lower lobe. There are calcified lower lobe granulomas bilaterally. Upper abdomen: No acute findings. There is mild contour irregularity of the liver. There are scattered calcified granulomas within the liver and spleen. No adrenal mass. Musculoskeletal/Chest wall: There is no chest wall mass or suspicious osseous finding. Review of the MIP images confirms the above findings. IMPRESSION: 1. No evidence of acute pulmonary embolism or other acute vascular process. 2. Chronic lung disease with progressive subpleural reticulation and patchy ground-glass opacities compared with previous CT of 06/13/2018. These findings likely relate to the patient's recent COVID-19 infection (residual inflammation versus postinflammatory scarring). 3. Sequela of prior granulomatous disease. 4. Stable central enlargement of the pulmonary arteries consistent with pulmonary arterial hypertension. Coronary and Aortic Atherosclerosis  (ICD10-I70.0). Electronically Signed   By: WRichardean SaleM.D.   On: 10/05/2019 16:29   DG Chest Portable 1 View  Result Date: 09/29/2019 CLINICAL DATA:  Shortness of breath.  Recent COVID-19 positive EXAM: PORTABLE CHEST 1 VIEW COMPARISON:  September 21, 2019. FINDINGS: There is multifocal airspace opacity throughout the lungs bilaterally with the greatest degree of opacification in the left lower lobe. Compared to the previous study, there has been slight clearing on the left with essentially stable changes on the right. Heart is borderline enlarged with pulmonary vascularity normal. Patient is status post median sternotomy. No adenopathy. There is aortic atherosclerosis. Postoperative changes noted in the right shoulder. IMPRESSION: Multifocal airspace opacity with overall slightly less opacity on the left compared to the previous study. Currently the greatest  degree of airspace opacity is in the left lower lobe region. Heart borderline enlarged, stable. Postoperative changes noted. No adenopathy. Aortic Atherosclerosis (ICD10-I70.0). Electronically Signed   By: Lowella Grip III M.D.   On: 09/20/2019 15:15     Scheduled Meds: . aspirin EC  81 mg Oral Daily  . Chlorhexidine Gluconate Cloth  6 each Topical Q0600  . clopidogrel  75 mg Oral Daily  . colchicine  0.6 mg Oral Daily  . dicyclomine  10 mg Oral TID AC  . enoxaparin (LOVENOX) injection  40 mg Subcutaneous Q24H  . gabapentin  600 mg Oral TID  . insulin aspart  0-20 Units Subcutaneous TID WC  . insulin glargine  60 Units Subcutaneous QHS  . Ipratropium-Albuterol  2 puff Inhalation Q4H  . methylPREDNISolone (SOLU-MEDROL) injection  40 mg Intravenous Q8H  . pantoprazole  40 mg Oral Daily  . pravastatin  40 mg Oral Daily  . probenecid  1,000 mg Oral BID   Continuous Infusions: . sodium chloride Stopped (10/09/19 1016)     LOS: 1 day     Enzo Bi, MD [facility] If 7PM-7AM, please contact night-coverage 10/09/2019, 8:32  PM

## 2019-10-10 ENCOUNTER — Inpatient Hospital Stay: Payer: Medicare Other

## 2019-10-10 DIAGNOSIS — E1165 Type 2 diabetes mellitus with hyperglycemia: Secondary | ICD-10-CM

## 2019-10-10 LAB — BLOOD GAS, ARTERIAL
Acid-Base Excess: 4.9 mmol/L — ABNORMAL HIGH (ref 0.0–2.0)
Bicarbonate: 25.1 mmol/L (ref 20.0–28.0)
FIO2: 100
O2 Saturation: 96.1 %
Patient temperature: 37
pCO2 arterial: 25 mmHg — ABNORMAL LOW (ref 32.0–48.0)
pH, Arterial: 7.61 (ref 7.350–7.450)
pO2, Arterial: 67 mmHg — ABNORMAL LOW (ref 83.0–108.0)

## 2019-10-10 LAB — BASIC METABOLIC PANEL
Anion gap: 9 (ref 5–15)
BUN: 13 mg/dL (ref 8–23)
CO2: 29 mmol/L (ref 22–32)
Calcium: 8.9 mg/dL (ref 8.9–10.3)
Chloride: 95 mmol/L — ABNORMAL LOW (ref 98–111)
Creatinine, Ser: 0.99 mg/dL (ref 0.61–1.24)
GFR calc Af Amer: >60 mL/min (ref >60)
GFR calc non Af Amer: >60 mL/min (ref >60)
Glucose, Bld: 293 mg/dL — ABNORMAL HIGH (ref 70–99)
Potassium: 3.6 mmol/L (ref 3.5–5.1)
Sodium: 133 mmol/L — ABNORMAL LOW (ref 135–145)

## 2019-10-10 LAB — GLUCOSE, CAPILLARY
Glucose-Capillary: 270 mg/dL — ABNORMAL HIGH (ref 70–99)
Glucose-Capillary: 285 mg/dL — ABNORMAL HIGH (ref 70–99)
Glucose-Capillary: 366 mg/dL — ABNORMAL HIGH (ref 70–99)
Glucose-Capillary: 345 mg/dL — ABNORMAL HIGH (ref 70–99)
Glucose-Capillary: 211 mg/dL — ABNORMAL HIGH (ref 70–99)

## 2019-10-10 LAB — PROCALCITONIN: Procalcitonin: 0.1 ng/mL

## 2019-10-10 LAB — CBC
HCT: 37.7 % — ABNORMAL LOW (ref 39.0–52.0)
Hemoglobin: 13.4 g/dL (ref 13.0–17.0)
MCH: 35.3 pg — ABNORMAL HIGH (ref 26.0–34.0)
MCHC: 35.5 g/dL (ref 30.0–36.0)
MCV: 99.2 fL (ref 80.0–100.0)
Platelets: 278 10*3/uL (ref 150–400)
RBC: 3.8 MIL/uL — ABNORMAL LOW (ref 4.22–5.81)
RDW: 13.5 % (ref 11.5–15.5)
WBC: 10.6 10*3/uL — ABNORMAL HIGH (ref 4.0–10.5)
nRBC: 0 % (ref 0.0–0.2)

## 2019-10-10 LAB — MAGNESIUM: Magnesium: 1.8 mg/dL (ref 1.7–2.4)

## 2019-10-10 MED ORDER — MORPHINE SULFATE (PF) 2 MG/ML IV SOLN
2.0000 mg | INTRAVENOUS | Status: DC | PRN
  Administered 2019-10-12 (×2): 2 mg via INTRAVENOUS
  Filled 2019-10-10 (×3): qty 1

## 2019-10-10 MED ORDER — TRAMADOL HCL 50 MG PO TABS
50.0000 mg | ORAL_TABLET | Freq: Four times a day (QID) | ORAL | Status: DC | PRN
  Administered 2019-10-12: 50 mg via ORAL
  Filled 2019-10-10: qty 1

## 2019-10-10 MED ORDER — FUROSEMIDE 10 MG/ML IJ SOLN
60.0000 mg | Freq: Once | INTRAMUSCULAR | Status: AC
  Administered 2019-10-10: 60 mg via INTRAVENOUS
  Filled 2019-10-10: qty 8

## 2019-10-10 MED ORDER — IPRATROPIUM BROMIDE 0.02 % IN SOLN: 0.5000 mg | RESPIRATORY_TRACT | Status: DC | PRN

## 2019-10-10 MED ORDER — IPRATROPIUM-ALBUTEROL 0.5-2.5 (3) MG/3ML IN SOLN
RESPIRATORY_TRACT | Status: AC
  Administered 2019-10-10: 3 mL
  Filled 2019-10-10: qty 3

## 2019-10-10 MED ORDER — INSULIN ASPART 100 UNIT/ML ~~LOC~~ SOLN
6.0000 [IU] | Freq: Three times a day (TID) | SUBCUTANEOUS | Status: DC
  Administered 2019-10-10 – 2019-10-13 (×4): 6 [IU] via SUBCUTANEOUS
  Filled 2019-10-10 (×6): qty 1

## 2019-10-10 MED ORDER — FUROSEMIDE 10 MG/ML IJ SOLN
40.0000 mg | Freq: Once | INTRAMUSCULAR | Status: AC
  Administered 2019-10-10: 19:00:00 40 mg via INTRAVENOUS
  Filled 2019-10-10: qty 4

## 2019-10-10 NOTE — Evaluation (Signed)
Physical Therapy Evaluation Patient Details Name: Chase Cabrera MRN: [id number] DOB: [birth year] Today's Date: 10/10/2019   History of Present Illness  Per MD notes: Pt is a 74 y.o. caucasian male with medical history significant of bronchiectasis, OSA not on CPAP, DM2 on insulin, MI, CAD status post multiple stents, HTN, gout who presented with worsening shortness of breath.   Pt recently diagnosed with Covid-19 on 09/04/19.  MD assessment includes: Acute hypoxic respiratory failure with recent Covid-19, h/o bronchiectasis, h/o OSA, neuropathy, h/o pulmonary HTN, and HTN.    Clinical Impression  Pt motivated to participate during the session but ultimately was limited by O2 desaturation with exertion.  Pt's SpO2 on 10L HFC at rest was 88% and remained in the upper 80's with below supine therex.  Once in sitting pt participated in minimal seated therex and then laterally scooted towards the [facility] with SpO2 dropping to 73%.  Pt was returned to supine with HOB elevated to max comfortable position with cues for PLB with SpO2 returning to mid to upper 80s in 60-90 sec, nursing notified.  Overall pt's functional mobility was primarily limited by O2 desaturation and once resolved the pt will be safe to return to his prior living situation with 24 hour supervision and HHPT services to safely address deficits listed in patient problem list for decreased caregiver assistance and eventual return to PLOF.      Follow Up Recommendations Home health PT;Supervision/Assistance - 24 hour    Equipment Recommendations  None recommended by PT    Recommendations for Other Services       Precautions / Restrictions Precautions Precautions: Fall Precaution Comments: O2 desats Restrictions Weight Bearing Restrictions: No      Mobility  Bed Mobility Overal bed mobility: Needs Assistance Bed Mobility: Supine to Sit;Sit to Supine     Supine to sit: Supervision Sit to supine: Supervision   General bed  mobility comments: Extra time and effort and cues for sequencing required  Transfers   Equipment used: None Transfers: Lateral/Scoot Transfers          Lateral/Scoot Transfers: Supervision General transfer comment: Pt able to laterally scoot x 2 feet with SBA with SpO2 dropping to 73%, further transfers/amb deferred  Ambulation/Gait             General Gait Details: Deferred secondary to O2 desaturation during lateral scooting  Stairs            Wheelchair Mobility    Modified Rankin (Stroke Patients Only)       Balance Overall balance assessment: Needs assistance Sitting-balance support: Feet supported Sitting balance-Leahy Scale: Good                                       Pertinent Vitals/Pain Pain Assessment: No/denies pain    Home Living Family/patient expects to be discharged to:: Private residence Living Arrangements: Spouse/significant other;Other relatives Available Help at Discharge: Family;Available 24 hours/day Type of Home: House Home Access: Level entry     Home Layout: One level Home Equipment: Walker - 2 wheels;Shower Public house manager - 4 wheels Additional Comments: 24/7 assistance available between spouse and grandson    Prior Function Level of Independence: Independent with assistive device(s)         Comments: Prior to recent Covid infection pt was Mod Ind with amb with a rollator limited community distances and Ind with ADLs, no fall history  Hand Dominance   Dominant Hand: Right    Extremity/Trunk Assessment   Upper Extremity Assessment Upper Extremity Assessment: Generalized weakness    Lower Extremity Assessment Lower Extremity Assessment: Generalized weakness       Communication   Communication: No difficulties  Cognition Arousal/Alertness: Awake/alert Behavior During Therapy: WFL for tasks assessed/performed Overall Cognitive Status: Within Functional Limits for tasks  assessed                                        General Comments      Exercises Total Joint Exercises Ankle Circles/Pumps: AROM;Strengthening;Both;5 reps;10 reps Quad Sets: Strengthening;Both;10 reps;5 reps Gluteal Sets: Strengthening;Both;10 reps;5 reps Heel Slides: AROM;Both;10 reps Hip ABduction/ADduction: AROM;Both;10 reps Long Arc Quad: AROM;Strengthening;Both;10 reps Knee Flexion: AROM;Strengthening;Both;10 reps Other Exercises Other Exercises: HEP education for BLE APs, QS, and GS x 10 each every 1-2 hours with counting out loud to avoid holding breath Other Exercises: PLB education and practice   Assessment/Plan    PT Assessment Patient needs continued PT services  PT Problem List Decreased strength;Decreased activity tolerance;Decreased balance;Decreased mobility;Cardiopulmonary status limiting activity       PT Treatment Interventions DME instruction;Gait training;Functional mobility training;Therapeutic activities;Therapeutic exercise;Balance training;Patient/family education    PT Goals (Current goals can be found in the Care Plan section)  Acute Rehab PT Goals Patient Stated Goal: To walk better and return home PT Goal Formulation: With patient Time For Goal Achievement: 11/20/19 Potential to Achieve Goals: Fair    Frequency Min 2X/week   Barriers to discharge        Co-evaluation               AM-PAC PT "6 Clicks" Mobility  Outcome Measure Help needed turning from your back to your side while in a flat bed without using bedrails?: A Little Help needed moving from lying on your back to sitting on the side of a flat bed without using bedrails?: A Little Help needed moving to and from a bed to a chair (including a wheelchair)?: A Little Help needed standing up from a chair using your arms (e.g., wheelchair or bedside chair)?: A Little Help needed to walk in hospital room?: Total Help needed climbing 3-5 steps with a railing? :  Total 6 Click Score: 14    End of Session Equipment Utilized During Treatment: Oxygen Activity Tolerance: Treatment limited secondary to medical complications (Comment) Patient left: in bed;with call bell/phone within reach;with bed alarm set;with family/visitor present Nurse Communication: Mobility status;Other (comment)(SpO2 decreased to 73% wtih activity) PT Visit Diagnosis: Muscle weakness (generalized) (M62.81);Difficulty in walking, not elsewhere classified (R26.2)    Time: 0177-9390 PT Time Calculation (min) (ACUTE ONLY): 30 min   Charges:   PT Evaluation $PT Eval Moderate Complexity: 1 Mod PT Treatments $Therapeutic Exercise: 8-22 mins        D. Royetta Asal PT, DPT 10/10/19, 5:19 PM

## 2019-10-10 NOTE — Progress Notes (Signed)
Patient seen for metaneb tx. Patient on 15 liter bubble HF with o2 sat noted at 85%. Patient is covid. Patient's o2 demands are not being met.  His lips and nose is blue and he is grunting.  He can not tolerate the flow of the metaneb.  I placed the patient on nrb at 15 liters with bubble high flow and sats increased to 94. He immediately began to pink up and grunting eased. I reported findings to RN as was not sure what limitations were for floor requirements.

## 2019-10-10 NOTE — Progress Notes (Addendum)
Called by RN concerned with increasing oxygen requirements and shortness of breath. Noted by respiratory signs of cyanosis prior to increasing supplemental oxygen.  Previous chest x-ray from today with worsening bilateral pulmonary infiltrates.  Was give furosemide 40 mg earlier today without good response.  ABG ordered and noted respiratory alkalosis and probable severe ARDS based on 100 % FiO2 and PO2 of 62 WBC noted to be in normal range. Procalcitonin 0.10. and afebrile.  Now patient with elevated white count and sepsis criteria. Repeat procal is pending.  D dimer more elevated   -Transfer to stepdown -Strict I & O -Additional 60 mg lasix ordered getting more adequate response. Will continue diuresis with bumex. -Bipap therapy - also given morphine to help with work of breathing despite patint saying he did not feel short of breath, his initial respiratory rate with my initial assessment almost 50. Blood cultures and lactic acid (1.7) - Started on cefepime and pharmacy consulted for vancomycin dosing for sepsis physiology presumable HCAP infection -CT  without PE but worsening bilateral pneumonitis fibrosis -Repeat ABG with improved alkalotic state as noted with normalization of pH and pCO2, however, remains hypoxic with PO2 of 57. Pumonology services officially consulted for worsening fibrosis/pneumonitis and discussed case with Darel Hong ACNP

## 2019-10-10 NOTE — Progress Notes (Signed)
[place] [facility] [place] NAME: Chase Cabrera    MR#:  [id number]  DATE OF BIRTH:  [birth year]  SUBJECTIVE:  patient seems to feel his breathing is getting better. He ate good lunch. Sats are 97% on high flow [place] 10 liters-- no respiratory distress  REVIEW OF SYSTEMS:   Review of Systems  Constitutional: Positive for malaise/fatigue. Negative for chills, fever and weight loss.  HENT: Negative for ear discharge, ear pain and nosebleeds.   Eyes: Negative for blurred vision, pain and discharge.  Respiratory: Negative for sputum production, shortness of breath, wheezing and stridor.   Cardiovascular: Negative for chest pain, palpitations, orthopnea and PND.  Gastrointestinal: Negative for abdominal pain, diarrhea, nausea and vomiting.  Genitourinary: Negative for frequency and urgency.  Musculoskeletal: Negative for back pain and joint pain.  Neurological: Positive for weakness. Negative for sensory change, speech change and focal weakness.  Psychiatric/Behavioral: Negative for depression and hallucinations. The patient is not nervous/anxious.    Tolerating Diet: Tolerating PT: SNF  DRUG ALLERGIES:   Allergies  Allergen Reactions  . Allopurinol Diarrhea, Other (See Comments) and Nausea And Vomiting    Other Reaction: GI Upset  . Atenolol Other (See Comments)    Other Reaction: bradycardia  . Atorvastatin Other (See Comments)    Other Reaction: muscle aches  . Ramipril Rash  . Rosuvastatin Other (See Comments)    Muscle aches  . Simvastatin Other (See Comments)    Other Reaction: MUSCLE ACHES (Zocor)  . Valsartan Other (See Comments)    Other Reaction: facial swelling  . Cardizem [Diltiazem] Rash    VITALS:  Blood pressure (!) 120/49, pulse 66, temperature (!) 97.5 F (36.4 C), temperature source Oral, resp. rate (!) 28, height 5' 8"  (1.727 m), weight 79.4 kg, SpO2 (!) 89 %.  PHYSICAL EXAMINATION:   Physical Exam  GENERAL:  74  y.o.-year-old patient lying in the bed with no acute distress.  EYES: Pupils equal, round, reactive to light and accommodation. No scleral icterus.   HEENT: Head atraumatic, normocephalic. Oropharynx and nasopharynx clear.  NECK:  Supple, no jugular venous distention. No thyroid enlargement, no tenderness.  LUNGS: diminished breath sounds bilaterally, no wheezing, rales, rhonchi. No use of accessory muscles of respiration.  CARDIOVASCULAR: S1, S2 normal. No murmurs, rubs, or gallops.  ABDOMEN: Soft, nontender, nondistended. Bowel sounds present. No organomegaly or mass.  EXTREMITIES: No cyanosis, clubbing or edema b/l.    NEUROLOGIC: Cranial nerves II through XII are intact. No focal Motor or sensory deficits b/l.   PSYCHIATRIC:  patient is alert and oriented x 3.  SKIN: No obvious rash, lesion, or ulcer.   LABORATORY PANEL:  CBC Recent Labs  Lab 10/10/19 0555  WBC 10.6*  HGB 13.4  HCT 37.7*  PLT 278    Chemistries  Recent Labs  Lab 09/22/2019 1452 10/09/19 0051 10/10/19 0555  NA 131*   < > 133*  K 3.3*   < > 3.6  CL 93*   < > 95*  CO2 28   < > 29  GLUCOSE 314*   < > 293*  BUN 13   < > 13  CREATININE 1.05   < > 0.99  CALCIUM 8.6*   < > 8.9  MG  --    < > 1.8  AST 56*  --   --   ALT 25  --   --   ALKPHOS 51  --   --   BILITOT 1.3*  --   --    < > =  values in this interval not displayed.   Cardiac Enzymes No results for input(s): TROPONINI in the last 168 hours. RADIOLOGY:  CT Head Wo Contrast  Result Date: 10/07/2019 CLINICAL DATA:  Headache and shortness of breath. EXAM: CT HEAD WITHOUT CONTRAST TECHNIQUE: Contiguous axial images were obtained from the base of the skull through the vertex without intravenous contrast. COMPARISON:  Head CT 06/13/2018 FINDINGS: Brain: Stable age related cerebral atrophy, ventriculomegaly and periventricular white matter disease. No extra-axial fluid collections are identified. No CT findings for acute hemispheric infarction or  intracranial hemorrhage. No mass lesions. The brainstem and cerebellum are normal. Vascular: No hyperdense vessels or aneurysm. Stable moderate vascular calcifications. Skull: No skull fracture or bone lesion. Sinuses/Orbits: The paranasal sinuses and mastoid air cells are clear. The globes are intact. Other: No scalp lesions or hematoma. IMPRESSION: 1. Stable age related cerebral atrophy, ventriculomegaly and periventricular white matter disease. 2. No acute intracranial findings or mass lesions. Electronically Signed   By: Marijo Sanes M.D.   On: 10/06/2019 16:20   CT Angio Chest PE W and/or Wo Contrast  Result Date: 10/03/2019 CLINICAL DATA:  Shortness of breath and hypoxemia. Diagnosed with COVID-19 infection 3 weeks ago. History of diabetes and congestive heart failure. EXAM: CT ANGIOGRAPHY CHEST WITH CONTRAST TECHNIQUE: Multidetector CT imaging of the chest was performed using the standard protocol during bolus administration of intravenous contrast. Multiplanar CT image reconstructions and MIPs were obtained to evaluate the vascular anatomy. CONTRAST:  12m OMNIPAQUE IOHEXOL 350 MG/ML SOLN COMPARISON:  Chest CT 06/13/2018. FINDINGS: Cardiovascular: The pulmonary arteries are well opacified with contrast to the level of the subsegmental branches. There is no evidence of acute pulmonary embolism. There is stable central enlargement of the pulmonary arteries consistent with pulmonary arterial hypertension. There is diffuse atherosclerosis of the aorta, great vessels and coronary arteries status post median sternotomy and CABG. The heart is mildly enlarged. There is no significant pericardial effusion. Mediastinum/Nodes: There are no enlarged mediastinal, hilar or axillary lymph nodes.Small chronic calcified mediastinal and hilar lymph nodes are present. The thyroid gland, trachea and esophagus demonstrate no significant findings. Lungs/Pleura: Trace bilateral pleural effusions. No pneumothorax. There is  diffuse subpleural reticulation in both lungs with patchy ground-glass opacities, greatest in the upper lobes and left lower lobe. There are calcified lower lobe granulomas bilaterally. Upper abdomen: No acute findings. There is mild contour irregularity of the liver. There are scattered calcified granulomas within the liver and spleen. No adrenal mass. Musculoskeletal/Chest wall: There is no chest wall mass or suspicious osseous finding. Review of the MIP images confirms the above findings. IMPRESSION: 1. No evidence of acute pulmonary embolism or other acute vascular process. 2. Chronic lung disease with progressive subpleural reticulation and patchy ground-glass opacities compared with previous CT of 06/13/2018. These findings likely relate to the patient's recent COVID-19 infection (residual inflammation versus postinflammatory scarring). 3. Sequela of prior granulomatous disease. 4. Stable central enlargement of the pulmonary arteries consistent with pulmonary arterial hypertension. Coronary and Aortic Atherosclerosis (ICD10-I70.0). Electronically Signed   By: WRichardean SaleM.D.   On: 09/27/2019 16:29   DG Chest Portable 1 View  Result Date: 10/03/2019 CLINICAL DATA:  Shortness of breath.  Recent COVID-19 positive EXAM: PORTABLE CHEST 1 VIEW COMPARISON:  September 21, 2019. FINDINGS: There is multifocal airspace opacity throughout the lungs bilaterally with the greatest degree of opacification in the left lower lobe. Compared to the previous study, there has been slight clearing on the left with essentially stable changes on the  right. Heart is borderline enlarged with pulmonary vascularity normal. Patient is status post median sternotomy. No adenopathy. There is aortic atherosclerosis. Postoperative changes noted in the right shoulder. IMPRESSION: Multifocal airspace opacity with overall slightly less opacity on the left compared to the previous study. Currently the greatest degree of airspace opacity is in  the left lower lobe region. Heart borderline enlarged, stable. Postoperative changes noted. No adenopathy. Aortic Atherosclerosis (ICD10-I70.0). Electronically Signed   By: Lowella Grip III M.D.   On: 10/06/2019 15:15   ASSESSMENT AND PLAN:  Len Azeez Tiptonis a 74 y.o.Caucasianmalewith medical history significant ofbronchiectasis, OSA not on CPAP, DM2 on insulin, MI, CAD status post multiple stents, HTN, gout who presented with worsening shortness of breath.  Pt was admitted to step-down due to already being on 6L and high risk of decompensating.   # Acute hypoxic respiratory failure # Recent COVID-19 infection # Hx ofBronchiectasis --On presentation, had fever and needed 6L O2 to maintain sats in low 90%. --Dxof Covid on 09/04/19 and was discharged on 09/25/2019 after receiving treatment with remdesivir,Actemra, and Decadron which he completed on 1/23, and3 L O2. Was doing well until day of presentation. -Superimposed PNA is less likely since procal is neg, and CT chest didn't show acute findings or consolidations but chronic lung disease findings.  --CRP 12.3, up from 1.3 on 1/12. --continue suppl O2 to maintain sats >90% wean down to keep sats >88% --Hold off abx --Start IV solu-medrol 40 mg q8h due to CRP elevation --change to oral in 1-2 days --Considered diuretic, however, pt's low BP precludes that for now. --Pulm consult with dr Mortimer Fries appreciated  # Hx ofBronchiectasis --Last seen pulmDr. Patsey Berthold on 08/17/19.   # Hx of OSA --Pt has declined CPAP in the past  # DM2 on insulin # Neuropathy --continue home T[place]iba[place] 60unightly (90u at home) --SSI TID --continue home gabapentin  # Hx of mild systolic CHF # Hx of pulm HTN --Last echo on 09/16/18 showed LVEF 45-50% --Hold home Lasix and other BP meds due to BP low on presentation. Resume as BP allows. --TTE   # Hx of MI's and CAD s/p stents --continue home ASA, plavix and statin  # HTN --Hold  home BP meds due to BP low on presentation. Resume as BP allows.  # Hx of gout --continue home colchicine and Probenecid  # GERD --continue home PPI  Physical therapy consultation placed  DVT prophylaxis: Lovenox SQ Code Status: Full code  Family Communication: not today Disposition Plan: rehab vs HH Barriers to d/c: pending physical therapy given high flow nasal carcinoma oxygen requirement    TOTAL TIME TAKING CARE OF THIS PATIENT: *30* minutes.  >50% time spent on counselling and coordination of care  Note: This dictation was prepared with Dragon dictation along with smaller phrase technology. Any transcriptional errors that result from this process are unintentional.  Fritzi Mandes M.D    [facility]   CC: Primary care physician; McLean-Scocuzza, Nino Glow, MDPatient ID: Chase Cabrera, male   DOB: [birth year], 74 y.o.   MRN: [id number]

## 2019-10-10 NOTE — Progress Notes (Signed)
Inpatient Diabetes Program Recommendations  AACE/ADA: New Consensus Statement on Inpatient Glycemic Control (2015)  Target Ranges:  Prepandial:   less than 140 mg/dL      Peak postprandial:   less than 180 mg/dL (1-2 hours)      Critically ill patients:  140 - 180 mg/dL   Lab Results  Component Value Date   GLUCAP 366 (H) 10/10/2019   HGBA1C 7.1 (H) 09/11/2019    Review of Glycemic Control  Results for Chase Cabrera, Chase Cabrera (MRN [id number]) as of 10/10/2019 13:19  Ref. Range 10/09/2019 23:03 10/10/2019 05:22 10/10/2019 05:55 10/10/2019 08:04 10/10/2019 11:59  Glucose-Capillary Latest Ref Range: 70 - 99 mg/dL 284 (H) 270 (H)  285 (H) 366 (H)    Diabetes history: DM2 Outpatient Diabetes medications: Lantus 24 units daily  Current orders for Inpatient glycemic control: Lantus 60 units daily + Novolog 0-20 units TID + Solumedrol 40 mg Q8H  Inpatient Diabetes Program Recommendations:     -Novolog 6 units TID with meals if eats at least 50% of meals -Novolog 0-5 units QHS  Thank you, Reche Dixon, RN, BSN Diabetes Coordinator Inpatient Diabetes Program [phone] (team pager from 8a-5p)

## 2019-10-10 NOTE — Progress Notes (Signed)
Patient desatted 60s at rest. RT at bedside. Patient placed on NRB, PRN Neb Tx given. Per Dr. Posey Pronto give 105m Lasix and repeat CXR. Patient Sats increased to 92% on 15L HFNC.   MFuller Mandril RN

## 2019-10-10 NOTE — Progress Notes (Addendum)
RT called to room to assess Patient due to low saturations. Patient sat 78% on 15l HFNC. Patient placed on NRB and order obtained for PRN treatment. Treatment given and patient returned to HFNC @ 15 LPM with saturations maintaining at 92%.

## 2019-10-10 NOTE — Progress Notes (Signed)
Pulmonary Medicine          Date: 10/10/2019,   MRN# [id number] DEMARKIS GHEEN 06-18-1946     AdmissionWeight: 75.8 kg                 CurrentWeight: 79.4 kg      CHIEF COMPLAINT:   Post -COVID19 pneumonitis   SUBJECTIVE   Patient reports clinical improvement.  He is on 10L/min [place].  He is using IS at 500cc per tidal volume max.    Discussed care plan with son at bedside.    PAST MEDICAL HISTORY   Past Medical History:  Diagnosis Date  . Arthritis   . Atrial fibrillation ([place])   . CAD (coronary artery disease)    6 STENTS  . CHF (congestive heart failure) ([place])   . Chicken pox   . Colon polyp   . Corneal dystrophy    bilateral  . COVID-19    09/09/19   . Crohn's disease ([place])   . Diabetes ([place]) 2002  . Dysrhythmia    A-FIB AND PAF  . GERD (gastroesophageal reflux disease)   . Gout   . History of kidney stones   . History of shingles   . Hyperlipemia   . Hypertension    2/2 b/l RAS >60% [place] 06/2018 s/p right stent   . Kidney stones   . Myocardial infarction ([place])    x4, last one in 2010  . Peripheral neuropathy   . Peripheral neuropathy   . Peripheral neuropathy   . PUD (peptic ulcer disease)   . Rectus sheath hematoma    02/18/18 8.9 x 4.5 cm then 02/19/18 7.9 x 4.5 cm   . Renal artery stenosis ([place])   . Renal artery stenosis ([place])   . Salzmann's nodular dystrophy 2012  . Sleep apnea   . Spinal stenosis      SURGICAL HISTORY   Past Surgical History:  Procedure Laterality Date  . ABLATION  2012  . APPLICATION VERTERBRAL DEFECT PROSTHETIC  05/01/2013  . ARTHRODESIS ANTERIOR LUMBAR SPINE  05/01/2013  . BACK SURGERY     lumbar fusion  . CARDIAC CATHETERIZATION    . CARDIAC ELECTROPHYSIOLOGY STUDY AND ABLATION    . CARDIAC SURGERY    . CARDIOVERSION    . CHOLECYSTECTOMY    . COLONOSCOPY WITH PROPOFOL N/A 04/09/2016   Completed for chronic diarrhea.  Few scattered diverticuli.  No mucosal lesions.  Surgeon: Lollie Sails, MD;   Location: [facility] ENDOSCOPY;  Service: Endoscopy;  Laterality: N/A;  . CORNEAL EYE SURGERY Bilateral 07/28/11    09/22/2011  . CORONARY ANGIOPLASTY    . CORONARY ANGIOPLASTY WITH STENT PLACEMENT  03/28/2015   Distal 80% to normal Stent, Dilation Balloon  . CORONARY ARTERY BYPASS GRAFT     4 VESSELS  . CORONARY/GRAFT ACUTE MI REVASCULARIZATION N/A 09/16/2018   Procedure: Coronary/Graft Acute MI Revascularization;  Surgeon: Isaias Cowman, MD;  Location: [place] [facility] LAB;  Service: Cardiovascular;  Laterality: N/A;  . EYE SURGERY     bilateral cataract 2017 Dr. Megan Mans  . foot and ankle repair Right 2005  . FRACTURE SURGERY     ANKLE PLATE AND SCREWS  . GALLBLADER    . JOINT REPLACEMENT     left total hip 11/11/15  . JOINT REPLACEMENT     right total hip 12/2017 Dr. Rudene Christians   . KNEE ARTHROSCOPY Right   . LEFT HEART CATH AND CORONARY ANGIOGRAPHY N/A 09/16/2018   Procedure:  LEFT HEART CATH AND CORONARY ANGIOGRAPHY;  Surgeon: Isaias Cowman, MD;  Location: [facility];  Service: Cardiovascular;  Laterality: N/A;  . LUMBAR SPINE FUSION ONE LEVEL  05/03/2013  . RENAL ARTERY STENT Right   . ROTATOR CUFF REPAIR Right 2006  . TONSILLECTOMY    . TOTAL HIP ARTHROPLASTY Left 11/11/2015   Procedure: TOTAL HIP ARTHROPLASTY ANTERIOR APPROACH;  Surgeon: Hessie Knows, MD;  Location: [facility] ORS;  Service: Orthopedics;  Laterality: Left;  . TOTAL HIP ARTHROPLASTY Right 12/13/2017   Procedure: TOTAL HIP ARTHROPLASTY ANTERIOR APPROACH;  Surgeon: Hessie Knows, MD;  Location: [facility] ORS;  Service: Orthopedics;  Laterality: Right;  . TRANSCATH PLACEMENT INTRAVASCULAR STENT LEG  03/2015     FAMILY HISTORY   Family History  Problem Relation Age of Onset  . CVA Father   . Stroke Father   . Lung cancer Mother   . Arthritis Mother   . Stomach cancer Sister   . Colon cancer Sister   . Diabetes Brother      SOCIAL HISTORY   Social History   Tobacco Use  . Smoking status: Former Smoker     Packs/day: 1.00    Years: 3.00    Pack years: 3.00    Types: Cigarettes    Quit date: 06/30/1962    Years since quitting: 57.3  . Smokeless tobacco: Former Systems developer    Types: Chew    Quit date: 12/05/1968  Substance Use Topics  . Alcohol use: No  . Drug use: No     MEDICATIONS    Home Medication:    Current Medication:  Current Facility-Administered Medications:  .  0.9 %  sodium chloride infusion, , Intravenous, PRN, Enzo Bi, MD, Stopped at 10/09/19 1016 .  acetaminophen (TYLENOL) tablet 1,000 mg, 1,000 mg, Oral, Q8H PRN, Enzo Bi, MD .  alum & mag hydroxide-simeth (MAALOX/MYLANTA) 200-200-20 MG/5ML suspension 15 mL, 15 mL, Oral, Q6H PRN, Enzo Bi, MD .  aspirin EC tablet 81 mg, 81 mg, Oral, Daily, Enzo Bi, MD, 81 mg at 10/10/19 0830 .  calcium carbonate (TUMS - dosed in mg elemental calcium) chewable tablet 200 mg of elemental calcium, 1 tablet, Oral, TID PRN, Enzo Bi, MD .  clopidogrel (PLAVIX) tablet 75 mg, 75 mg, Oral, Daily, Enzo Bi, MD, 75 mg at 10/10/19 2778 .  colchicine tablet 0.6 mg, 0.6 mg, Oral, Daily, Enzo Bi, MD, 0.6 mg at 10/10/19 2423 .  dicyclomine (BENTYL) capsule 10 mg, 10 mg, Oral, TID Bing Matter, MD, 10 mg at 10/10/19 1301 .  docusate sodium (COLACE) capsule 100 mg, 100 mg, Oral, BID PRN, Enzo Bi, MD .  enoxaparin (LOVENOX) injection 40 mg, 40 mg, Subcutaneous, Q24H, Charlett Nose, RPH, 40 mg at 10/09/19 2315 .  gabapentin (NEURONTIN) capsule 600 mg, 600 mg, Oral, TID, Enzo Bi, MD, 600 mg at 10/10/19 5361 .  guaiFENesin-dextromethorphan (ROBITUSSIN DM) 100-10 MG/5ML syrup 10 mL, 10 mL, Oral, Q6H PRN, Enzo Bi, MD .  insulin aspart (novoLOG) injection 0-20 Units, 0-20 Units, Subcutaneous, TID WC, Enzo Bi, MD, 15 Units at 10/10/19 1720 .  insulin aspart (novoLOG) injection 6 Units, 6 Units, Subcutaneous, TID WC, Patel, Sona, MD .  insulin glargine (LANTUS) injection 60 Units, 60 Units, Subcutaneous, QHS, Enzo Bi, MD, 60 Units at  10/09/19 2312 .  Ipratropium-Albuterol (COMBIVENT) respimat 2 puff, 2 puff, Inhalation, Q4H, Flora Lipps, MD, 2 puff at 10/10/19 1234 .  methylPREDNISolone sodium succinate (SOLU-MEDROL) 40 mg/mL injection 40 mg, 40 mg, Intravenous, Q8H,  Enzo Bi, MD, 40 mg at 10/10/19 0514 .  ondansetron (ZOFRAN) injection 4 mg, 4 mg, Intravenous, Q6H PRN, Enzo Bi, MD .  ondansetron (ZOFRAN-ODT) disintegrating tablet 4 mg, 4 mg, Oral, Q8H PRN, Enzo Bi, MD .  pantoprazole (PROTONIX) EC tablet 40 mg, 40 mg, Oral, Daily, Enzo Bi, MD, 40 mg at 10/10/19 [phone] .  polyethylene glycol (MIRALAX / GLYCOLAX) packet 17 g, 17 g, Oral, BID PRN, Enzo Bi, MD .  pravastatin (PRAVACHOL) tablet 40 mg, 40 mg, Oral, Daily, Enzo Bi, MD .  probenecid (BENEMID) tablet 1,000 mg, 1,000 mg, Oral, BID, Enzo Bi, MD, 1,000 mg at 10/10/19 6256    ALLERGIES   Allopurinol, Atenolol, Atorvastatin, Ramipril, Rosuvastatin, Simvastatin, Valsartan, and Cardizem [diltiazem]     REVIEW OF SYSTEMS    Review of Systems:  Gen:  Denies  fever, sweats, chills weigh loss  HEENT: Denies blurred vision, double vision, ear pain, eye pain, hearing loss, nose bleeds, sore throat Cardiac:  No dizziness, chest pain or heaviness, chest tightness,edema Resp:   Sob cough Gi: Denies swallowing difficulty, stomach pain, nausea or vomiting, diarrhea, constipation, bowel incontinence Gu:  Denies bladder incontinence, burning urine Ext:   Denies Joint pain, stiffness or swelling Skin: Denies  skin rash, easy bruising or bleeding or hives Endoc:  Denies polyuria, polydipsia , polyphagia or weight change Psych:   Denies depression, insomnia or hallucinations   Other:  All other systems negative   VS: BP (!) 120/49 (BP Location: Right Arm)   Pulse 66   Temp (!) 97.5 F (36.4 C) (Oral)   Resp (!) 28   Ht 5' 8"  (1.727 m)   Wt 79.4 kg   SpO2 (!) 89%   BMI 26.62 kg/m      PHYSICAL EXAM    GENERAL:NAD, no fevers, chills, no weakness  no fatigue HEAD: Normocephalic, atraumatic.  EYES: Pupils equal, round, reactive to light. Extraocular muscles intact. No scleral icterus.  MOUTH: Moist mucosal membrane. Dentition intact. No abscess noted.  EAR, NOSE, THROAT: Clear without exudates. No external lesions.  NECK: Supple. No thyromegaly. No nodules. No JVD.  PULMONARY: Diffuse but mild rhochi bilaterally  CARDIOVASCULAR: S1 and S2. Regular rate and rhythm. No murmurs, rubs, or gallops. No edema. Pedal pulses 2+ bilaterally.  GASTROINTESTINAL: Soft, nontender, nondistended. No masses. Positive bowel sounds. No hepatosplenomegaly.  MUSCULOSKELETAL: No swelling, clubbing, or edema. Range of motion full in all extremities.  NEUROLOGIC: Cranial nerves II through XII are intact. No gross focal neurological deficits. Sensation intact. Reflexes intact.  SKIN: No ulceration, lesions, rashes, or cyanosis. Skin warm and dry. Turgor intact.  PSYCHIATRIC: Mood, affect within normal limits. The patient is awake, alert and oriented x 3. Insight, judgment intact.       IMAGING    cxr 1v 5am  Result Date: 09/21/2019 CLINICAL DATA:  Acute respiratory disease due to COVID-19. EXAM: CHEST  1 VIEW COMPARISON:  09/12/2019. FINDINGS: Prior median sternotomy. Cardiomegaly. Diffuse bilateral pulmonary infiltrates/edema again noted. New infiltrate noted in the left upper lung. No prominent pleural effusion. No pneumothorax. IMPRESSION: 1.  Prior median sternotomy.  Stable cardiomegaly. 2. Diffuse bilateral pulmonary infiltrates/edema again noted. New infiltrate noted the left upper lung. Patient is known COVID-19 positive. Electronically Signed   By: Marcello Moores  Register   On: 09/21/2019 07:06   CT Head Wo Contrast  Result Date: 09/15/2019 CLINICAL DATA:  Headache and shortness of breath. EXAM: CT HEAD WITHOUT CONTRAST TECHNIQUE: Contiguous axial images were obtained from the base of the  skull through the vertex without intravenous contrast. COMPARISON:   Head CT 06/13/2018 FINDINGS: Brain: Stable age related cerebral atrophy, ventriculomegaly and periventricular white matter disease. No extra-axial fluid collections are identified. No CT findings for acute hemispheric infarction or intracranial hemorrhage. No mass lesions. The brainstem and cerebellum are normal. Vascular: No hyperdense vessels or aneurysm. Stable moderate vascular calcifications. Skull: No skull fracture or bone lesion. Sinuses/Orbits: The paranasal sinuses and mastoid air cells are clear. The globes are intact. Other: No scalp lesions or hematoma. IMPRESSION: 1. Stable age related cerebral atrophy, ventriculomegaly and periventricular white matter disease. 2. No acute intracranial findings or mass lesions. Electronically Signed   By: Marijo Sanes M.D.   On: 09/21/2019 16:20   CT Angio Chest PE W and/or Wo Contrast  Result Date: 09/30/2019 CLINICAL DATA:  Shortness of breath and hypoxemia. Diagnosed with COVID-19 infection 3 weeks ago. History of diabetes and congestive heart failure. EXAM: CT ANGIOGRAPHY CHEST WITH CONTRAST TECHNIQUE: Multidetector CT imaging of the chest was performed using the standard protocol during bolus administration of intravenous contrast. Multiplanar CT image reconstructions and MIPs were obtained to evaluate the vascular anatomy. CONTRAST:  25m OMNIPAQUE IOHEXOL 350 MG/ML SOLN COMPARISON:  Chest CT 06/13/2018. FINDINGS: Cardiovascular: The pulmonary arteries are well opacified with contrast to the level of the subsegmental branches. There is no evidence of acute pulmonary embolism. There is stable central enlargement of the pulmonary arteries consistent with pulmonary arterial hypertension. There is diffuse atherosclerosis of the aorta, great vessels and coronary arteries status post median sternotomy and CABG. The heart is mildly enlarged. There is no significant pericardial effusion. Mediastinum/Nodes: There are no enlarged mediastinal, hilar or axillary lymph  nodes.Small chronic calcified mediastinal and hilar lymph nodes are present. The thyroid gland, trachea and esophagus demonstrate no significant findings. Lungs/Pleura: Trace bilateral pleural effusions. No pneumothorax. There is diffuse subpleural reticulation in both lungs with patchy ground-glass opacities, greatest in the upper lobes and left lower lobe. There are calcified lower lobe granulomas bilaterally. Upper abdomen: No acute findings. There is mild contour irregularity of the liver. There are scattered calcified granulomas within the liver and spleen. No adrenal mass. Musculoskeletal/Chest wall: There is no chest wall mass or suspicious osseous finding. Review of the MIP images confirms the above findings. IMPRESSION: 1. No evidence of acute pulmonary embolism or other acute vascular process. 2. Chronic lung disease with progressive subpleural reticulation and patchy ground-glass opacities compared with previous CT of 06/13/2018. These findings likely relate to the patient's recent COVID-19 infection (residual inflammation versus postinflammatory scarring). 3. Sequela of prior granulomatous disease. 4. Stable central enlargement of the pulmonary arteries consistent with pulmonary arterial hypertension. Coronary and Aortic Atherosclerosis (ICD10-I70.0). Electronically Signed   By: WRichardean SaleM.D.   On: 10/01/2019 16:29   DG Chest Portable 1 View  Result Date: 10/05/2019 CLINICAL DATA:  Shortness of breath.  Recent COVID-19 positive EXAM: PORTABLE CHEST 1 VIEW COMPARISON:  September 21, 2019. FINDINGS: There is multifocal airspace opacity throughout the lungs bilaterally with the greatest degree of opacification in the left lower lobe. Compared to the previous study, there has been slight clearing on the left with essentially stable changes on the right. Heart is borderline enlarged with pulmonary vascularity normal. Patient is status post median sternotomy. No adenopathy. There is aortic  atherosclerosis. Postoperative changes noted in the right shoulder. IMPRESSION: Multifocal airspace opacity with overall slightly less opacity on the left compared to the previous study. Currently the greatest degree of airspace opacity is  in the left lower lobe region. Heart borderline enlarged, stable. Postoperative changes noted. No adenopathy. Aortic Atherosclerosis (ICD10-I70.0). Electronically Signed   By: Lowella Grip III M.D.   On: 10/12/2019 15:15   DG Chest Port 1 View  Result Date: 09/12/2019 CLINICAL DATA:  COVID-19 pneumonia. EXAM: PORTABLE CHEST 1 VIEW COMPARISON:  09/04/2019 FINDINGS: Patient rotated right. Prior median sternotomy. Midline trachea. Moderate cardiomegaly. No pleural effusion or pneumothorax. Bibasilar interstitial opacities, new since the prior. IMPRESSION: Bibasilar interstitial opacities, consistent with COVID-19 pneumonia. Electronically Signed   By: Abigail Miyamoto M.D.   On: 09/12/2019 08:42      ASSESSMENT/PLAN   Acute on chronic hypoxemic respiratory failure - due to pnuemonitis secondary to COVID19 infection -currently on solumedrol 40 q8h - colchicine po once daily  -albuterol nebulizer bid -chest physiotherapy - metaneb bid  -physical and occupational therapy   DM II  - please monitor blood sugars with ISS q4h while on higher dose steroids     Thank you for allowing me to participate in the care of this patient.   Patient/Family are satisfied with care plan and all questions have been answered.  This document was prepared using Dragon voice recognition software and may include unintentional dictation errors.     Ottie Glazier, M.D.  Division of [place]

## 2019-10-11 ENCOUNTER — Inpatient Hospital Stay: Payer: Medicare Other

## 2019-10-11 ENCOUNTER — Encounter: Payer: Self-pay | Admitting: Hospitalist

## 2019-10-11 ENCOUNTER — Inpatient Hospital Stay: Payer: Self-pay

## 2019-10-11 DIAGNOSIS — Z7189 Other specified counseling: Secondary | ICD-10-CM

## 2019-10-11 DIAGNOSIS — J441 Chronic obstructive pulmonary disease with (acute) exacerbation: Secondary | ICD-10-CM

## 2019-10-11 DIAGNOSIS — Z515 Encounter for palliative care: Secondary | ICD-10-CM

## 2019-10-11 DIAGNOSIS — J84112 Idiopathic pulmonary fibrosis: Secondary | ICD-10-CM

## 2019-10-11 DIAGNOSIS — Z66 Do not resuscitate: Secondary | ICD-10-CM

## 2019-10-11 LAB — BASIC METABOLIC PANEL
Anion gap: 15 (ref 5–15)
BUN: 22 mg/dL (ref 8–23)
CO2: 24 mmol/L (ref 22–32)
Calcium: 9 mg/dL (ref 8.9–10.3)
Chloride: 95 mmol/L — ABNORMAL LOW (ref 98–111)
Creatinine, Ser: 1.18 mg/dL (ref 0.61–1.24)
GFR calc Af Amer: >60 mL/min (ref >60)
GFR calc non Af Amer: >60 mL/min (ref >60)
Glucose, Bld: 178 mg/dL — ABNORMAL HIGH (ref 70–99)
Potassium: 3.6 mmol/L (ref 3.5–5.1)
Sodium: 134 mmol/L — ABNORMAL LOW (ref 135–145)

## 2019-10-11 LAB — PROCALCITONIN: Procalcitonin: 0.29 ng/mL

## 2019-10-11 LAB — URINE CULTURE: Culture: >=100000 — AB

## 2019-10-11 LAB — BLOOD GAS, ARTERIAL
Acid-Base Excess: 4.1 mmol/L — ABNORMAL HIGH (ref 0.0–2.0)
Bicarbonate: 28.5 mmol/L — ABNORMAL HIGH (ref 20.0–28.0)
Delivery systems: POSITIVE
Expiratory PAP: 5
FIO2: 1
Inspiratory PAP: 10
O2 Saturation: 90.6 %
Patient temperature: 37
RATE: 8 resp/min
pCO2 arterial: 41 mmHg (ref 32.0–48.0)
pH, Arterial: 7.45 (ref 7.350–7.450)
pO2, Arterial: 57 mmHg — ABNORMAL LOW (ref 83.0–108.0)

## 2019-10-11 LAB — TSH: TSH: 1.765 u[IU]/mL (ref 0.350–4.500)

## 2019-10-11 LAB — LACTIC ACID, PLASMA
Lactic Acid, Venous: 1.7 mmol/L (ref 0.5–1.9)
Lactic Acid, Venous: 1.3 mmol/L (ref 0.5–1.9)

## 2019-10-11 LAB — CBC
HCT: 36.5 % — ABNORMAL LOW (ref 39.0–52.0)
Hemoglobin: 12.7 g/dL — ABNORMAL LOW (ref 13.0–17.0)
MCH: 34.5 pg — ABNORMAL HIGH (ref 26.0–34.0)
MCHC: 34.8 g/dL (ref 30.0–36.0)
MCV: 99.2 fL (ref 80.0–100.0)
Platelets: 334 10*3/uL (ref 150–400)
RBC: 3.68 MIL/uL — ABNORMAL LOW (ref 4.22–5.81)
RDW: 14.2 % (ref 11.5–15.5)
WBC: 16.9 10*3/uL — ABNORMAL HIGH (ref 4.0–10.5)
nRBC: 0 % (ref 0.0–0.2)

## 2019-10-11 LAB — MAGNESIUM: Magnesium: 1.7 mg/dL (ref 1.7–2.4)

## 2019-10-11 LAB — GLUCOSE, CAPILLARY
Glucose-Capillary: 153 mg/dL — ABNORMAL HIGH (ref 70–99)
Glucose-Capillary: 160 mg/dL — ABNORMAL HIGH (ref 70–99)
Glucose-Capillary: 176 mg/dL — ABNORMAL HIGH (ref 70–99)
Glucose-Capillary: 122 mg/dL — ABNORMAL HIGH (ref 70–99)
Glucose-Capillary: 130 mg/dL — ABNORMAL HIGH (ref 70–99)

## 2019-10-11 LAB — STREP PNEUMONIAE URINARY ANTIGEN: Strep Pneumo Urinary Antigen: NEGATIVE

## 2019-10-11 LAB — FIBRIN DERIVATIVES D-DIMER (ARMC ONLY): Fibrin derivatives D-dimer (ARMC): 760.23 ng/mL (FEU) — ABNORMAL HIGH (ref 0.00–499.00)

## 2019-10-11 MED ORDER — SODIUM CHLORIDE 0.9 % IV SOLN
2.0000 g | Freq: Two times a day (BID) | INTRAVENOUS | Status: DC
  Administered 2019-10-11: 2 g via INTRAVENOUS
  Filled 2019-10-11 (×2): qty 2

## 2019-10-11 MED ORDER — ASCORBIC ACID 500 MG PO TABS
500.0000 mg | ORAL_TABLET | ORAL | Status: DC
  Administered 2019-10-11 – 2019-10-13 (×3): 500 mg via ORAL
  Filled 2019-10-11 (×3): qty 1

## 2019-10-11 MED ORDER — BUMETANIDE 0.25 MG/ML IJ SOLN
2.0000 mg | Freq: Once | INTRAMUSCULAR | Status: DC
  Filled 2019-10-11: qty 8

## 2019-10-11 MED ORDER — MORPHINE SULFATE (PF) 4 MG/ML IV SOLN
4.0000 mg | Freq: Once | INTRAVENOUS | Status: AC
  Administered 2019-10-11: 4 mg via INTRAVENOUS
  Filled 2019-10-11: qty 1

## 2019-10-11 MED ORDER — MORPHINE SULFATE (PF) 2 MG/ML IV SOLN
2.0000 mg | Freq: Once | INTRAVENOUS | Status: AC
  Administered 2019-10-11: 2 mg via INTRAVENOUS
  Filled 2019-10-11: qty 1

## 2019-10-11 MED ORDER — CHLORHEXIDINE GLUCONATE CLOTH 2 % EX PADS
6.0000 | MEDICATED_PAD | Freq: Every day | CUTANEOUS | Status: DC
  Administered 2019-10-11 – 2019-10-12 (×2): 6 via TOPICAL

## 2019-10-11 MED ORDER — MAGNESIUM SULFATE 2 GM/50ML IV SOLN
2.0000 g | Freq: Once | INTRAVENOUS | Status: AC
  Administered 2019-10-11: 2 g via INTRAVENOUS
  Filled 2019-10-11: qty 50

## 2019-10-11 MED ORDER — IOHEXOL 350 MG/ML SOLN
75.0000 mL | Freq: Once | INTRAVENOUS | Status: AC | PRN
  Administered 2019-10-11: 75 mL via INTRAVENOUS

## 2019-10-11 MED ORDER — SODIUM CHLORIDE 0.9 % IV SOLN
2.0000 g | Freq: Three times a day (TID) | INTRAVENOUS | Status: DC
  Filled 2019-10-11 (×2): qty 2

## 2019-10-11 MED ORDER — POTASSIUM CHLORIDE CRYS ER 20 MEQ PO TBCR
40.0000 meq | EXTENDED_RELEASE_TABLET | Freq: Once | ORAL | Status: AC
  Administered 2019-10-11: 40 meq via ORAL
  Filled 2019-10-11: qty 2

## 2019-10-11 MED ORDER — VANCOMYCIN HCL 1250 MG/250ML IV SOLN: 1250.0000 mg | INTRAVENOUS | Status: DC

## 2019-10-11 MED ORDER — ZINC SULFATE 220 (50 ZN) MG PO CAPS
220.0000 mg | ORAL_CAPSULE | ORAL | Status: DC
  Administered 2019-10-11 – 2019-10-13 (×3): 220 mg via ORAL
  Filled 2019-10-11 (×3): qty 1

## 2019-10-11 MED ORDER — B COMPLEX-C PO TABS
1.0000 | ORAL_TABLET | ORAL | Status: DC
  Administered 2019-10-11 – 2019-10-13 (×3): 1 via ORAL
  Filled 2019-10-11 (×4): qty 1

## 2019-10-11 MED ORDER — VANCOMYCIN HCL 2000 MG/400ML IV SOLN
2000.0000 mg | Freq: Once | INTRAVENOUS | Status: AC
  Administered 2019-10-11: 2000 mg via INTRAVENOUS
  Filled 2019-10-11: qty 400

## 2019-10-11 MED ORDER — ADULT MULTIVITAMIN W/MINERALS CH
1.0000 | ORAL_TABLET | ORAL | Status: DC
  Administered 2019-10-12 – 2019-10-13 (×2): 1 via ORAL
  Filled 2019-10-11 (×2): qty 1

## 2019-10-11 MED ORDER — BUDESONIDE 0.5 MG/2ML IN SUSP
0.5000 mg | Freq: Two times a day (BID) | RESPIRATORY_TRACT | Status: DC
  Administered 2019-10-11 – 2019-10-14 (×7): 0.5 mg via RESPIRATORY_TRACT
  Filled 2019-10-11 (×6): qty 2

## 2019-10-11 MED ORDER — AMOXICILLIN 500 MG PO CAPS
500.0000 mg | ORAL_CAPSULE | Freq: Three times a day (TID) | ORAL | Status: DC
  Administered 2019-10-11 – 2019-10-13 (×7): 500 mg via ORAL
  Filled 2019-10-11 (×11): qty 1

## 2019-10-11 MED ORDER — IPRATROPIUM-ALBUTEROL 0.5-2.5 (3) MG/3ML IN SOLN
3.0000 mL | RESPIRATORY_TRACT | Status: DC
  Administered 2019-10-11 – 2019-10-14 (×18): 3 mL via RESPIRATORY_TRACT
  Filled 2019-10-11 (×18): qty 3

## 2019-10-11 NOTE — Progress Notes (Signed)
Patient has been grunting, short of breath, labored breathing and O2 destat numerous times to the 50s. NP notified and came to see the patient at the bedside. NP placed transfer orders and patient sent to ICU.

## 2019-10-11 NOTE — Progress Notes (Signed)
PHARMACY CONSULT NOTE - FOLLOW UP  Pharmacy Consult for Electrolyte Monitoring and Replacement   Chase Cabrera is a 74 y.o. male admitted on 10/09/2019 with acute hypoxic respiratory failure. PMH includes CHF, CAD, Crohn's disease, diabetes, GERD, T2DM, MI, bronchiectasis, OSA not on CPAP, HTN, HLD, Afib, COVID-19 (08/2019). Per am rounds, discussed about administrations of furosemide 74m IV x 2 doses yesterday. Pharmacy consulted for monitoring and replacing electrolytes.   Recent Labs: Potassium (mmol/L)  Date Value  10/11/2019 3.6   Magnesium (mg/dL)  Date Value  10/11/2019 1.7   Calcium (mg/dL)  Date Value  10/11/2019 9.0   Albumin (g/dL)  Date Value  10/05/2019 3.0 (L)   Phosphorus (mg/dL)  Date Value  09/11/2019 2.7   Sodium (mmol/L)  Date Value  10/11/2019 134 (L)     Assessment: 1. Electrolytes: Electrolytes are WNL. Sodium levels have improved since yesterday. Potassium levels have been stable and WNL for the past 2 days. 2 doses of furosemide 432mIV were given to pt yesterday. Pt will receive 40 mEq x 1 dose today. Corrected calcium level is 9.8 (WNL). Phosphorus levels have not been indicated since admission. Pt also received 2 doses of magnesium sulfate 2g IV. Continue to monitor electrolytes in am labs. Will replace to achieve goals for potassium ~4, sodium ~140, and magnesium ~2.   2. Glucose: Today's range: 153-178. Glucose levels are lower than yesterday. Glucose-capillary monitored Q4H. Pt is on insulin glargine 60 units [place] QHS, along with insulin aspart 6 units [place] TID WM and insulin aspart 0-20 units [place] TID WM. Pt is on Solu-medrol 40 mg IV Q8H. Continue insulin administrations and monitor glucose levels daily.  3. Constipation: Last BM: 01/27. Pt is on docusate sodium 100 mg PO BID PRN and miralax 17g PO BID PRN for constipation. Continue to monitor.     SuRoanna BanningPharmD Candidate 10/11/2019 1:14 PM

## 2019-10-11 NOTE — Progress Notes (Signed)
[place] visited pt.'s rm. while rounding on ICU and observing a number of family members gathered.  Pt in bed w/O2 facemask; wife @ bedside; two sons and dtr. in law in rm.  Henry found two chairs for brothers; dicussed pt.'s situation w/family.  Pt. unable to speak easily due to mask.  Family shared pt. was brought to [facility] two days ago; prior [place] admission at [facility];  sent to [facility] in [place] for treatment; current admission appears to be for complications related to previous COVID.  Family allowed more than one visitor due to family meeting w/palliative care; family wanted to allow three great grandchildren to visit briefly. [place] consulted RN, charge RN, and palliative care RN and clarified [place] visitor policy to family, namely that they were granted extra visitors only for today; visitors younger than 10 usually not allowed, but palliative care RN in process of contacting [facility] to pursue possibility of exception and would follow up.  Family apparently agreeable and understanding of policy.  No further needs expressed at this time.       10/11/19 1600  Clinical Encounter Type  Visited With Patient and family together;Health care provider  Visit Type Initial;Psychological support;Social support;Critical Care  Referral From  (RT)  Spiritual Encounters  Spiritual Needs Emotional  Stress Factors  Patient Stress Factors Family relationships;Health changes;Loss of control;Major life changes  Family Stress Factors Major life changes;Loss of control;Health changes;Family relationships

## 2019-10-11 NOTE — Progress Notes (Signed)
Patient talking with family, saturations in the mid 12's. Increased to 100% and sats rose to 88-90%.

## 2019-10-11 NOTE — Progress Notes (Signed)
Patient transported to CT on Bipap with RN and NT. Patient became anxious and tachypneic immediately after transitioning from CT bed to ICU bed. Patient pulled Bipap mask off and began coughing stating he felt nauseous. SATs dropped down to 84%, Bipap mask was placed back on patient and he was transported back up to ICU, SATs returned to 97%. Patient experiences episodes of anxiousness accompanied with coughing. Patient instructed to focus on breathing slow steady breaths to normalize respirations. Patient is able to speak in full complete sentences but does have some periodic grunting with exertion. Patient now resting comfortably in bed.

## 2019-10-11 NOTE — Progress Notes (Signed)
Pharmacy Antibiotic Note  Chase Cabrera is a 74 y.o. male admitted on 09/19/2019 with pneumonia w/ increasing O2 requirements.  Pharmacy has been consulted for vanc/cefepime dosing.   Plan: Will administer vanc 2g IV load following by:  Vancomycin 1250 mg IV Q 24 hrs. Goal AUC 400-550. Expected AUC: 457.2 SCr used: 1.18 (b/l 1.1 - 1.14) Cssmin: 10.9  Will start cefepime 2g IV q12h per CrCl 30 - 60 ml/min and will continue to monitor.  Height: 5' 8"  (172.7 cm) Weight: 175 lb 0.7 oz (79.4 kg) IBW/kg (Calculated) : 68.4  Temp (24hrs), Avg:98 F (36.7 C), Min:97.5 F (36.4 C), Max:98.7 F (37.1 C)  Recent Labs  Lab 09/20/2019 1452 10/07/2019 1600 10/09/19 0039 10/09/19 0051 10/10/19 0555 10/11/19 0045  WBC 9.8  --   --  11.8* 10.6* 16.9*  CREATININE 1.05  --   --  1.10 0.99 1.18  LATICACIDVEN  --  1.9 1.5  --   --   --     Estimated Creatinine Clearance: 52.3 mL/min (by C-G formula based on SCr of 1.18 mg/dL).    Allergies  Allergen Reactions  . Allopurinol Diarrhea, Other (See Comments) and Nausea And Vomiting    Other Reaction: GI Upset  . Atenolol Other (See Comments)    Other Reaction: bradycardia  . Atorvastatin Other (See Comments)    Other Reaction: muscle aches  . Ramipril Rash  . Rosuvastatin Other (See Comments)    Muscle aches  . Simvastatin Other (See Comments)    Other Reaction: MUSCLE ACHES (Zocor)  . Valsartan Other (See Comments)    Other Reaction: facial swelling  . Cardizem [Diltiazem] Rash    Thank you for allowing pharmacy to be a part of this patient's care.  Tobie Lords, PharmD, BCPS Clinical Pharmacist 10/11/2019 3:10 AM

## 2019-10-11 NOTE — Consult Note (Signed)
Name: Chase Cabrera MRN: [id number] DOB: 1946-02-05    ADMISSION DATE:  09/19/2019 CONSULTATION DATE:  10/11/2019  REFERRING MD :  Sharion Settler, NP  CHIEF COMPLAINT:  Acute Respiratory Distress, Hypoxia  BRIEF PATIENT DESCRIPTION:  74 year old male admitted 09/26/2019 with acute on chronic hypoxic respiratory failure secondary to post COVID-19 pneumonitis/fibrosis.  On 1/28 he was noted to have acute respiratory distress and severe hypoxia requiring transfer to stepdown for BiPAP.  CTA chest 1/28 negative for PE, however with progressive pneumonitis/fibrosis. Questionable superimposed pneumonia. High risk for intubation.  SIGNIFICANT EVENTS  1/25-admission to [facility] 1/26- PCCM consulted for worsening hypoxia 1/27- Transferred to [facility] 1/28- Acute Respiratory Distress & Hypoxia; transfer back to Stepdown for BiPAP  STUDIES:  1/25- CT Head w/o Contrast>>1. Stable age related cerebral atrophy, ventriculomegaly and periventricular white matter disease. 2. No acute intracranial findings or mass lesions. 1/25- CTA Chest>> 1. No evidence of acute pulmonary embolism or other acute vascular process. 2. Chronic lung disease with progressive subpleural reticulation and patchy ground-glass opacities compared with previous CT of 06/13/2018. These findings likely relate to the patient's recent COVID-19 infection (residual inflammation versus postinflammatory scarring). 3. Sequela of prior granulomatous disease. 4. Stable central enlargement of the pulmonary arteries consistent with pulmonary arterial hypertension. Coronary and Aortic Atherosclerosis 1/28- CTA Chest>> 1. Progressive pneumonitis compared to 3 days ago. There also some coarse opacities with mild traction bronchiectasis suggesting superimposed fibrotic process. 2. Negative for pulmonary embolism.  CULTURES: Blood cultures 1/25>> no growth to date Urine 1/25>> Enterococcus faecalis Blood cultures 1/28>> Strep pneumo  urinary antigen 1/28>> Legionella urinary antigen 1/28>>  ANTIBIOTICS: Vancomycin 1/25>> 1/25 Cefepime 1/28>> Vancomycin 1/28>>  HISTORY OF PRESENT ILLNESS:   Mr. Baumbach is a 74 year old male with a past medical history notable for bronchiectasis, OSA not on CPAP, diabetes mellitus, MI, CAD, hypertension, and gout who presented to [facility] ED on 09/26/2019 with complaints of worsening shortness of breath.  He was recently diagnosed with COVID-19 on 09/04/2019 and was discharged on 09/25/2019 after being treated with remdesivir, Actemra, Decadron.  He was discharged home on 3 L nasal cannula and doing better until today when his shortness of breath worsened.  Upon presentation to the ED he was noted to be febrile with temperature 100.7, blood pressure 107/80, respiratory rate 24, and 93% on 6 L O2.  CTA chest was negative for PE, but showed chronic lung disease with progressive subpleural reticulation and patchy groundglass opacities.  He was admitted to stepdown unit by the hospitalist for further work-up and treatment of acute on chronic hypoxic respiratory failure secondary to post COVID-19 pneumonitis/fibrosis.    On 1/26 he had worsening hypoxia and increasing FiO2 requirements, therefore PCCM was consulted.  He was able to be transferred to the [facility] floor on 1/27.  However early in the morning on 1/28 he developed acute respiratory distress and worsening hypoxia with O2 sats in the 70s.  ABG with respiratory alkalosis and hypoxia.  Found to have leukocytosis with a WBC 16.9, procalcitonin 0.29, lactic acid 1.7, and D-dimer 760.  Chest x-ray showed worsening of pulmonary infiltrates.  He was given 60 mg IV Lasix, placed on IV cefepime and vancomycin, and was subsequently transferred to stepdown unit for BiPAP.  CTA chest repeated which was negative for PE, however revealed progressive pneumonitis/fibrosis.  PCCM is consulted to assist with worsening acute on chronic hypoxic respiratory failure secondary  to progressive post COVID-19 pneumonitis/fibrosis and questionable superimposed pneumonia.  PAST MEDICAL HISTORY :  has a past medical history of Arthritis, Atrial fibrillation ([place]), CAD (coronary artery disease), CHF (congestive heart failure) ([place]), Chicken pox, Colon polyp, Corneal dystrophy, COVID-19, Crohn's disease ([place]), Diabetes ([place]) (2002), Dysrhythmia, GERD (gastroesophageal reflux disease), Gout, History of kidney stones, History of shingles, Hyperlipemia, Hypertension, Kidney stones, Myocardial infarction [facility]), Peripheral neuropathy, Peripheral neuropathy, Peripheral neuropathy, PUD (peptic ulcer disease), Rectus sheath hematoma, Renal artery stenosis ([place]), Renal artery stenosis ([place]), Salzmann's nodular dystrophy (2012), Sleep apnea, and Spinal stenosis.  has a past surgical history that includes GALLBLADER; Cardiac catheterization; Coronary angioplasty; Coronary angioplasty with stent (03/28/2015); Cardiac electrophysiology study and ablation; Cardioversion; Back surgery; Total hip arthroplasty (Left, 11/11/2015); Rotator cuff repair (Right, 2006); Renal artery stent (Right); foot and ankle repair (Right, 2005); Tonsillectomy; Knee arthroscopy (Right); Colonoscopy with propofol (N/A, 04/09/2016); Fracture surgery; APPLICATION VERTERBRAL DEFECT PROSTHETIC (05/01/2013); ARTHRODESIS ANTERIOR LUMBAR SPINE (05/01/2013); Cholecystectomy; CORNEAL EYE SURGERY (Bilateral, 07/28/11    09/22/2011); LUMBAR SPINE FUSION ONE LEVEL (05/03/2013); TRANSCATH PLACEMENT INTRAVASCULAR STENT LEG (03/2015); Coronary artery bypass graft; Cardiac surgery; Ablation (2012); Total hip arthroplasty (Right, 12/13/2017); Joint replacement; Joint replacement; Eye surgery; Coronary/Graft Acute MI Revascularization (N/A, 09/16/2018); and LEFT HEART CATH AND CORONARY ANGIOGRAPHY (N/A, 09/16/2018). Prior to Admission medications   Medication Sig Start Date End Date Taking? Authorizing Provider  Alpha-Lipoic Acid 600 MG CAPS Take 1  capsule (600 mg total) by mouth 2 (two) times daily. 10/02/19  Yes McLean-Scocuzza, Nino Glow, MD  amitriptyline (ELAVIL) 50 MG tablet Take 1 tablet (50 mg total) by mouth at bedtime as needed. 10/03/19  Yes McLean-Scocuzza, Nino Glow, MD  amLODipine (NORVASC) 5 MG tablet Take 1 tablet (5 mg total) by mouth 2 (two) times daily. If blood pressure >140/>90 make take another 1 pill of 5 mg 05/01/18  Yes Mayo, Pete Pelt, MD  aspirin EC 81 MG EC tablet Take 1 tablet (81 mg total) by mouth daily. 05/02/18  Yes Mayo, Pete Pelt, MD  carvedilol (COREG) 25 MG tablet Take 1 tablet (25 mg total) by mouth 2 (two) times daily with a meal. 03/27/19  Yes McLean-Scocuzza, Nino Glow, MD  Cholecalciferol 1.25 MG (50000 UT) TABS Take 1 tablet by mouth once a week. 07/12/19  Yes McLean-Scocuzza, Nino Glow, MD  cloNIDine (CATAPRES) 0.2 MG tablet Take 1 tablet (0.2 mg total) by mouth 2 (two) times daily. 07/11/19  Yes McLean-Scocuzza, Nino Glow, MD  clopidogrel (PLAVIX) 75 MG tablet Take 75 mg by mouth daily.   Yes [provider]  colchicine 0.6 MG tablet TAKE 1 TABLET BY MOUTH ONCE DAILY Patient taking differently: Take 0.6 mg by mouth daily.  07/12/17  Yes Leone Haven, MD  diclofenac sodium (VOLTAREN) 1 % GEL Apply 2 g topically 4 (four) times daily as needed (for pain).    Yes [provider]  dicyclomine (BENTYL) 10 MG capsule Take 10 mg by mouth 3 (three) times daily before meals.    Yes [provider]  fluticasone (FLONASE) 50 MCG/ACT nasal spray Place 2 sprays into both nostrils daily. 05/10/19  Yes McLean-Scocuzza, Nino Glow, MD  furosemide (LASIX) 40 MG tablet Take 2 tablets (80 mg total) by mouth every other day. 09/25/19 09/24/20 Yes Patrecia Pour, MD  gabapentin (NEURONTIN) 300 MG capsule take 2 capsules (658m) by mouth three times a day 10/02/19  Yes McLean-Scocuzza, TNino Glow MD  hydrALAZINE (APRESOLINE) 100 MG tablet TAKE 100 MG BY MOUTH THREE TIMES A DAY 01/02/19  Yes McLean-Scocuzza, TNino Glow  MD  insulin lispro (HUMALOG) 100 UNIT/ML  KiwkPen Inject 14-32 Units into the skin 3 (three) times daily. (according to sliding scale - max 96u over 24 hours)   Yes [provider]  isosorbide mononitrate (IMDUR) 120 MG 24 hr tablet TAKE 1 TABLET BY MOUTH EVERY DAY Patient taking differently: Take 120 mg by mouth daily.  10/07/17  Yes McLean-Scocuzza, Nino Glow, MD  levocetirizine (XYZAL) 5 MG tablet Take 1 tablet (5 mg total) by mouth every evening. 02/09/18  Yes McLean-Scocuzza, Nino Glow, MD  montelukast (SINGULAIR) 10 MG tablet Take 1 tablet (10 mg total) by mouth at bedtime. 07/03/19  Yes McLean-Scocuzza, Nino Glow, MD  Multiple Vitamin (MULTI-VITAMINS) TABS Take 1 tablet by mouth daily. 04/15/09  Yes [provider]  nitroGLYCERIN (NITROSTAT) 0.4 MG SL tablet Place 1 tablet (0.4 mg total) under the tongue every 5 (five) minutes x 3 doses as needed. For chest pain.  Call 911 if no relief after 3 tablets 10/03/17 11/02/21 Yes McLean-Scocuzza, Nino Glow, MD  omega-3 acid ethyl esters (LOVAZA) 1 g capsule Take 2 capsules (2 g total) by mouth 2 (two) times daily. 05/12/18  Yes McLean-Scocuzza, Nino Glow, MD  omeprazole (PRILOSEC) 40 MG capsule Take 40 mg by mouth 2 (two) times daily.    Yes [provider]  pravastatin (PRAVACHOL) 40 MG tablet Take 1 tablet (40 mg total) by mouth daily. 03/28/19  Yes McLean-Scocuzza, Nino Glow, MD  probenecid (BENEMID) 500 MG tablet Take 1,000 mg by mouth 2 (two) times daily. 09/03/19  Yes [provider]  sodium chloride HYPERTONIC 3 % nebulizer solution Take by nebulization 2 (two) times daily. 06/14/18  Yes Juanito Doom, MD  spironolactone (ALDACTONE) 25 MG tablet Take 0.5 tablets (12.5 mg total) by mouth daily. 09/25/19 09/24/20 Yes Patrecia Pour, MD  TRESIBA FLEXTOUCH 200 UNIT/ML SOPN Inject 90 Units into the skin at bedtime. 04/25/18  Yes [provider]  vitamin C (ASCORBIC ACID) 500 MG tablet Take 500 mg by mouth daily.    Yes  [provider]  Vitamin E 400 units TABS Take 400 Units by mouth 2 (two) times daily.   Yes [provider]   Allergies  Allergen Reactions  . Allopurinol Diarrhea, Other (See Comments) and Nausea And Vomiting    Other Reaction: GI Upset  . Atenolol Other (See Comments)    Other Reaction: bradycardia  . Atorvastatin Other (See Comments)    Other Reaction: muscle aches  . Ramipril Rash  . Rosuvastatin Other (See Comments)    Muscle aches  . Simvastatin Other (See Comments)    Other Reaction: MUSCLE ACHES (Zocor)  . Valsartan Other (See Comments)    Other Reaction: facial swelling  . Cardizem [Diltiazem] Rash    FAMILY HISTORY:  family history includes Arthritis in his mother; CVA in his father; Colon cancer in his sister; Diabetes in his brother; Lung cancer in his mother; Stomach cancer in his sister; Stroke in his father. SOCIAL HISTORY:  reports that he quit smoking about 57 years ago. His smoking use included cigarettes. He has a 3.00 pack-year smoking history. He quit smokeless tobacco use about 50 years ago.  His smokeless tobacco use included chew. He reports that he does not drink alcohol or use drugs.   COVID-19 DISASTER DECLARATION:  FULL CONTACT PHYSICAL EXAMINATION WAS NOT POSSIBLE DUE TO TREATMENT OF COVID-19 AND  CONSERVATION OF PERSONAL PROTECTIVE EQUIPMENT, LIMITED EXAM FINDINGS INCLUDE-  Patient assessed or the symptoms described in the history of present illness.  In the context  of the Global COVID-19 pandemic, which necessitated consideration that the patient might be at risk for infection with the SARS-CoV-2 virus that causes COVID-19, Institutional protocols and algorithms that pertain to the evaluation of patients at risk for COVID-19 are in a state of rapid change based on information released by regulatory bodies including the CDC and federal and state organizations. These policies and algorithms were followed during the patient's care  while in hospital.  REVIEW OF SYSTEMS:  Positives in BOLD Constitutional: Negative for fever, chills, weight loss, malaise/fatigue and diaphoresis.  HENT: Negative for hearing loss, ear pain, nosebleeds, congestion, sore throat, neck pain, tinnitus and ear discharge.   Eyes: Negative for blurred vision, double vision, photophobia, pain, discharge and redness.  Respiratory: Negative for +cough, hemoptysis, sputum production, +shortness of breath, wheezing and stridor.   Cardiovascular: Negative for chest pain, palpitations, orthopnea, claudication, leg swelling and PND.  Gastrointestinal: Negative for heartburn, nausea, vomiting, abdominal pain, diarrhea, constipation, blood in stool and melena.  Genitourinary: Negative for dysuria, urgency, frequency, hematuria and flank pain.  Musculoskeletal: Negative for myalgias, back pain, joint pain and falls.  Skin: Negative for itching and rash.  Neurological: Negative for dizziness, tingling, tremors, sensory change, speech change, focal weakness, seizures, loss of consciousness, weakness and headaches.  Endo/Heme/Allergies: Negative for environmental allergies and polydipsia. Does not bruise/bleed easily.  SUBJECTIVE:  Patient reports his breathing has improved on BiPAP, currently denies shortness of breath Reports intermittent dry cough Denies chest pain, fever/chills, abdominal pain, nausea/vomiting, edema  VITAL SIGNS: Temp:  [97.5 F (36.4 C)-98.7 F (37.1 C)] 98.7 F (37.1 C) (01/28 0400) Pulse Rate:  [55-81] 55 (01/28 0400) Resp:  [20-37] 27 (01/28 0400) BP: (118-130)/(49-72) 118/59 (01/28 0400) SpO2:  [89 %-97 %] 96 % (01/28 0400) FiO2 (%):  [100 %] 100 % (01/28 0022)  PHYSICAL EXAMINATION: General: Acute on chronically ill-appearing male, sitting in bed, on BiPAP, with mild respiratory distress Neuro: Awake, alert and oriented x4, speech clear, follows commands, no focal deficits HEENT: Atraumatic, normocephalic, neck supple, no  JVD Cardiovascular: Regular rate and rhythm, S1-S2, no murmurs rubs or gallops, 2+ pulses Lungs: Coarse breath sounds throughout, no wheezing, even, BiPAP assisted, tachypnea Abdomen: Soft, nontender, nondistended, no guarding or rebound tenderness, bowel sounds positive x4 Musculoskeletal: No deformities, normal bulk and tone, no edema Skin: Warm and dry, no obvious rashes, lesions, or ulcerations  Recent Labs  Lab 10/09/19 0051 10/10/19 0555 10/11/19 0045  NA 132* 133* 134*  K 2.9* 3.6 3.6  CL 94* 95* 95*  CO2 29 29 24   BUN 15 13 22   CREATININE 1.10 0.99 1.18  GLUCOSE 338* 293* 178*   Recent Labs  Lab 10/09/19 0051 10/10/19 0555 10/11/19 0045  HGB 11.8* 13.4 12.7*  HCT 34.4* 37.7* 36.5*  WBC 11.8* 10.6* 16.9*  PLT 271 278 334   CT ANGIO CHEST PE W OR WO CONTRAST  Result Date: 10/11/2019 CLINICAL DATA:  Shortness of breath with sign 0 CIS EXAM: CT ANGIOGRAPHY CHEST WITH CONTRAST TECHNIQUE: Multidetector CT imaging of the chest was performed using the standard protocol during bolus administration of intravenous contrast. Multiplanar CT image reconstructions and MIPs were obtained to evaluate the vascular anatomy. CONTRAST:  45m OMNIPAQUE IOHEXOL 350 MG/ML SOLN COMPARISON:  Three days ago FINDINGS: Cardiovascular: Cardiomegaly. Extensive aortic and coronary atherosclerosis status post CABG. No pulmonary artery filling defect or acute aortic finding. Mediastinum/Nodes: Negative for adenopathy or mass. No pneumomediastinum Lungs/Pleura: Patchy ground-glass opacity superimposed on fibrotic appearing coarse interstitial opacities with  low lung volumes. This alveolitis appearance has progressed. No consolidation, effusion, or generalized septal thickening. Calcified granulomas in the left lower lobe. Upper Abdomen: Granulomatous calcifications. Cholecystectomy and right renal artery stenting. Musculoskeletal: No acute or aggressive finding. Review of the MIP images confirms the above  findings. IMPRESSION: 1. Progressive pneumonitis compared to 3 days ago. There also some coarse opacities with mild traction bronchiectasis suggesting superimposed fibrotic process. 2. Negative for pulmonary embolism. Electronically Signed   By: Monte Fantasia M.D.   On: 10/11/2019 05:13   DG Chest Port 1 View  Result Date: 10/10/2019 CLINICAL DATA:  Shortness of breath. Smoking history. Coronavirus infection. EXAM: PORTABLE CHEST 1 VIEW COMPARISON:  10/10/2019 FINDINGS: Radiographic worsening of widespread patchy bronchopneumonia throughout both lungs. No dense consolidation or lobar collapse. No visible effusion. Previous median sternotomy and CABG. Cardiomegaly. IMPRESSION: Radiographic worsening of widespread pulmonary infiltrates. Electronically Signed   By: Nelson Chimes M.D.   On: 10/10/2019 18:44   [place] EKG SITE RITE  Result Date: 10/11/2019 If Site Rite image not attached, placement could not be confirmed due to current cardiac rhythm.   ASSESSMENT / PLAN:  Acute on chronic hypoxic respiratory failure secondary to severe post COVID-19 pneumonitis/ fibrosis ? Superimposed Pneumonia Hx: Bronchiectasis, Pulmonary HTN, OSA -Supplemental O2 as needed to maintain O2 sats greater than 88% -BiPAP, wean as tolerated -High risk for intubation, discussed with patient he would want short-term intubation if needed -Follow intermittent ABG and chest x-ray as needed -Continue Solu-Medrol 40 mg q8 hours -Continue colchicine -Bronchodilators -Chest PT -Continue Cefepime & Vancomycin for now -Received 60 mg IV Lasix prior to transfer to Stepdown --CTA chest on 1/28 negative for PE, concerning for worsening pneumonitis with superimposed fibrosis  Leukocytosis ? Superimposed pneumonia -Monitor fever curve -Trend WBCs and procalcitonin -Started on cefepime and vancomycin by hospitalist, can consider discontinuing if procalcitonin negative   Chronic HFrEF (LVEF 45 to 50% on 09/16/2018) Hx: HTN,  CAD -Continuous cardiac monitor -Maintain MAP greater than 65 -Received 60 mg IV Lasix prior to transfer to ICU -Lasix as blood pressure and renal function permits           Disposition: Stepdown Goals of care: Full code.  Had long discussion with patient regarding CODE STATUS and that given his fibrosis if he is intubated is likely he may not be able to wean from ventilator.  Recommend palliative care consultation for goals of care. VTE prophylaxis: Lovenox subcu Updates: Updated patient at bedside 2021  Darel Hong, [facility] Pager: [phone]  10/11/2019, 5:50 AM

## 2019-10-11 NOTE — Progress Notes (Signed)
[place] [facility] [place] NAME: Chase Cabrera    MR#:  [id number]  DATE OF BIRTH:  1946/07/26  SUBJECTIVE:  patient got transferred to ICU last pm with worsening SOB and sats down in the 70's Currently on BIPAP sats >95-96% REVIEW OF SYSTEMS:   Review of Systems  Constitutional: Positive for malaise/fatigue. Negative for chills, fever and weight loss.  HENT: Negative for ear discharge, ear pain and nosebleeds.   Eyes: Negative for blurred vision, pain and discharge.  Respiratory: Positive for cough and shortness of breath. Negative for sputum production, wheezing and stridor.   Cardiovascular: Negative for chest pain, palpitations, orthopnea and PND.  Gastrointestinal: Negative for abdominal pain, diarrhea, nausea and vomiting.  Genitourinary: Negative for frequency and urgency.  Musculoskeletal: Negative for back pain and joint pain.  Neurological: Positive for weakness. Negative for sensory change, speech change and focal weakness.  Psychiatric/Behavioral: Negative for depression and hallucinations. The patient is not nervous/anxious.    Tolerating Diet: yes --some Tolerating PT: SNF  DRUG ALLERGIES:   Allergies  Allergen Reactions  . Allopurinol Diarrhea, Other (See Comments) and Nausea And Vomiting    Other Reaction: GI Upset  . Atenolol Other (See Comments)    Other Reaction: bradycardia  . Atorvastatin Other (See Comments)    Other Reaction: muscle aches  . Ramipril Rash  . Rosuvastatin Other (See Comments)    Muscle aches  . Simvastatin Other (See Comments)    Other Reaction: MUSCLE ACHES (Zocor)  . Valsartan Other (See Comments)    Other Reaction: facial swelling  . Cardizem [Diltiazem] Rash    VITALS:  Blood pressure 136/88, pulse 62, temperature 98 F (36.7 C), temperature source Axillary, resp. rate (!) 26, height 5' 8" (1.727 m), weight 79.4 kg, SpO2 91 %.  PHYSICAL EXAMINATION:   Physical Exam  GENERAL:  74  y.o.-year-old patient lying in the bed with mild to mod acute resp distress.  EYES: Pupils equal, round, reactive to light and accommodation. HEENT: Head atraumatic, normocephalic. Oropharynx and nasopharynx clear. BIPAP+  LUNGS: diminished breath sounds bilaterally, no wheezing, rales,occ rhonchi. No use of accessory muscles of respiration.  CARDIOVASCULAR: S1, S2 normal. No murmurs, rubs, or gallops.  ABDOMEN: Soft, nontender, nondistended. Bowel sounds present. No organomegaly or mass.  EXTREMITIES: No cyanosis, clubbing o -+edema b/l.    NEUROLOGIC:  Grossly No focal Motor or sensory deficits b/l.   PSYCHIATRIC:  patient is alert and awake  SKIN: No obvious rash, lesion, or ulcer.   LABORATORY PANEL:  CBC Recent Labs  Lab 10/11/19 0045  WBC 16.9*  HGB 12.7*  HCT 36.5*  PLT 334    Chemistries  Recent Labs  Lab 09/28/2019 1452 10/09/19 0051 10/11/19 0045  NA 131*   < > 134*  K 3.3*   < > 3.6  CL 93*   < > 95*  CO2 28   < > 24  GLUCOSE 314*   < > 178*  BUN 13   < > 22  CREATININE 1.05   < > 1.18  CALCIUM 8.6*   < > 9.0  MG  --    < > 1.7  AST 56*  --   --   ALT 25  --   --   ALKPHOS 51  --   --   BILITOT 1.3*  --   --    < > = values in this interval not displayed.   Cardiac Enzymes No results for  input(s): TROPONINI in the last 168 hours. RADIOLOGY:  CT ANGIO CHEST PE W OR WO CONTRAST  Result Date: 10/11/2019 CLINICAL DATA:  Shortness of breath with sign 0 CIS EXAM: CT ANGIOGRAPHY CHEST WITH CONTRAST TECHNIQUE: Multidetector CT imaging of the chest was performed using the standard protocol during bolus administration of intravenous contrast. Multiplanar CT image reconstructions and MIPs were obtained to evaluate the vascular anatomy. CONTRAST:  83m OMNIPAQUE IOHEXOL 350 MG/ML SOLN COMPARISON:  Three days ago FINDINGS: Cardiovascular: Cardiomegaly. Extensive aortic and coronary atherosclerosis status post CABG. No pulmonary artery filling defect or acute aortic  finding. Mediastinum/Nodes: Negative for adenopathy or mass. No pneumomediastinum Lungs/Pleura: Patchy ground-glass opacity superimposed on fibrotic appearing coarse interstitial opacities with low lung volumes. This alveolitis appearance has progressed. No consolidation, effusion, or generalized septal thickening. Calcified granulomas in the left lower lobe. Upper Abdomen: Granulomatous calcifications. Cholecystectomy and right renal artery stenting. Musculoskeletal: No acute or aggressive finding. Review of the MIP images confirms the above findings. IMPRESSION: 1. Progressive pneumonitis compared to 3 days ago. There also some coarse opacities with mild traction bronchiectasis suggesting superimposed fibrotic process. 2. Negative for pulmonary embolism. Electronically Signed   By: JMonte FantasiaM.D.   On: 10/11/2019 05:13   DG Chest Port 1 View  Result Date: 10/10/2019 CLINICAL DATA:  Shortness of breath. Smoking history. Coronavirus infection. EXAM: PORTABLE CHEST 1 VIEW COMPARISON:  10/04/2019 FINDINGS: Radiographic worsening of widespread patchy bronchopneumonia throughout both lungs. No dense consolidation or lobar collapse. No visible effusion. Previous median sternotomy and CABG. Cardiomegaly. IMPRESSION: Radiographic worsening of widespread pulmonary infiltrates. Electronically Signed   By: MNelson ChimesM.D.   On: 10/10/2019 18:44   [place] SITE RITE  Result Date: 10/11/2019 If Site Rite image not attached, placement could not be confirmed due to current cardiac rhythm.  ASSESSMENT AND PLAN:  Chase TibbittsTiptonis a 74y.o.Caucasianmalewith medical history significant ofbronchiectasis, OSA not on CPAP, DM2 on insulin, MI, CAD status post multiple stents, HTN, gout who presented with worsening shortness of breath.    # Acute hypoxic respiratory failure # Recent COVID-19 infection # Hx ofBronchiectasis --On presentation, had fever and needed 6L O2 to maintain sats in low 90%. --Dxof  Covid on 09/04/19 and was discharged on 09/25/2019 after receiving treatment with remdesivir,Actemra, and Decadron which he completed on 10/06/19 and3 L O2. Was doing well until day of presentation. -Superimposed PNA is less likely since procal is neg, and CT chest didn't show acute findings or consolidations but has worsening chronic lung disease findings/fibrosis/scarring --CRP 12.3, up from 1.3 on 1/12. --continue suppl O2 to maintain sats >90% wean down to keep sats >88% --Hold off abx --cont  IV solu-medrol 40 mg q8h  --recieved 100 mg lasix last pm with good uop --Pulm consult with dr kMortimer Friesappreciated. Pt needs to be in ICU for now. -On po Augmentin day 1  # Hx ofBronchiectasis --Last seen pulmDr. GPatsey Bertholdas out pt on 08/17/19.   # Hx of OSA --Pt has declined CPAP in the past  # DM2 on insulin # Neuropathy --continue homeLantusas 60unightly (90u at home) --SSI and Aspart TID --continue home gabapentin  # Hx of mild systolic CHF # Hx of pulm HTN --Last echo on 09/16/18 showed LVEF 45-50% --cont IV Lasix prn as BP allows.  # Hx of MI's and CAD s/p stents --continue home ASA, plavix and statin  # HTN --Hold home BP meds due to BP low on presentation. Resume as BP allows.  #  Hx of gout --continue home colchicine and Probenecid  # GERD --continue home PPI  PT to see once stable from respiratory wise  Palliative care to met with pt and son and pt is now DNR. He is in agreement with comfort care if his condition declines  DVT prophylaxis: Lovenox SQ Code Status: DNR (10/11/2019--d/w with palliative care) Family Communication: not today Disposition Plan: rehab vs HH Barriers to d/c: transferred to ICU given worsening respiratory status  TOTAL TIME TAKING CARE OF THIS PATIENT: *30* minutes.  >50% time spent on counselling and coordination of care  Note: This dictation was prepared with Dragon dictation along with smaller phrase technology. Any  transcriptional errors that result from this process are unintentional.  Fritzi Mandes M.D    [facility]   CC: Primary care physician; McLean-Scocuzza, Nino Glow, MDPatient ID: [name], male   DOB: 11/16/1945, 74 y.o.   MRN: [id number]

## 2019-10-11 NOTE — Consult Note (Addendum)
Consultation Note Date: 10/11/2019   Patient Name: Chase Cabrera  DOB: 1945/09/29  MRN: [id number]  Age / Sex: 74 y.o., male  PCP: McLean-Scocuzza, Nino Glow, MD Referring Physician: Fritzi Mandes, MD  Reason for Consultation: Establishing goals of care  HPI/Patient Profile: 74 y.o. male  with past medical history of bronchiectasis, MI, CAD with multiple stents, combined CHF EF 45-50%, HTN, OSA not on CPAP, diabetes, Crohn's disease, gout, recent COVID infection/pneumonia with admission 09/09/19-09/25/19 admitted on 09/14/2019 with worsening shortness of breath due to bronchiectasis requiring HCNF. Early 1/28 he required transition to ICU for BiPAP support.   Clinical Assessment and Goals of Care: Chase Cabrera is resting comfortably on BiPAP but has had some anxiety per RN. I called his wife and spoke with her about meeting with family and explained that he is now on BiPAP and high risk for intubation. Wife expresses to me that she believes that his desire was for DNR and has previously told her that he was "ready to go." I explained that this would be very reasonable as his lungs are severely damaged and he would be unlikely to successfully come off ventilator.   We gathered family together today with wife, 2 sons, daughter in law at bedside. We discussed Chase Cabrera's overall decline in respiratory status and lung function related to underlying bronchiectesis and further damage that was done from his COVID infection/pneumonia. I expressed my concern that he is requiring a significant amount of support via BiPAP. We discussed how to proceed if he were to decline with BiPAP as the next step would be a breathing machine/life support. I did explain that I would be concerned that he would likely struggle to be able to come off of a breathing machine. Chase Cabrera shares that he does NOT desire life support, breathing, machines,  CPR - DNR placed. Family confirm that these wishes are consistent with previous stated.   He does desire to continue with current level of care with hopes of improvement. He is actually tolerating BiPAP very well and able to communicate, denies any shortness of breath, and even requests "a hamburger." BiPAP is allowing him at this time an opportunity for improvement and quality time with his family. If he declines we would focus on keeping him comfortable at end of life. We did briefly discuss that the BiPAP itself can become a form of life support but there is often associated decline as he is unable to have good nutrition or move around and often this eventually leads to further complications and decline.  All questions/concerns addressed. Emotional support provided.   Primary Decision Maker PATIENT    SUMMARY OF RECOMMENDATIONS   - Continue current level of care with hopes of improvement - Comfort focus if he declines further - NO intubation  Code Status/Advance Care Planning:  DNR   Symptom Management:   Per attending and PCCM  Palliative Prophylaxis:   Aspiration, Bowel Regimen, Delirium Protocol, Frequent Pain Assessment, Oral Care and Turn Reposition  Additional Recommendations (Limitations, Scope, Preferences):  NO INTUBATION  Psycho-social/Spiritual:   Desire for further Chaplaincy support:yes  Additional Recommendations: Caregiving  Support/Resources and Grief/Bereavement Support  Prognosis:   Overall prognosis guarded with severe lung disease and high oxygen requirements and support.   Discharge Planning: To Be Determined      Primary Diagnoses: Present on Admission: . Acute respiratory failure with hypoxemia ([place])   I have reviewed the medical record, interviewed the patient and family, and examined the patient. The following aspects are pertinent.  Past Medical History:  Diagnosis Date  . Arthritis   . Atrial fibrillation ([place])   . CAD (coronary  artery disease)    6 STENTS  . CHF (congestive heart failure) ([place])   . Chicken pox   . Colon polyp   . Corneal dystrophy    bilateral  . COVID-19    09/09/19   . Crohn's disease ([place])   . Diabetes ([place]) 2002  . Dysrhythmia    A-FIB AND PAF  . GERD (gastroesophageal reflux disease)   . Gout   . History of kidney stones   . History of shingles   . Hyperlipemia   . Hypertension    2/2 b/l RAS >60% [place] 06/2018 s/p right stent   . Kidney stones   . Myocardial infarction ([place])    x4, last one in 2010  . Peripheral neuropathy   . Peripheral neuropathy   . Peripheral neuropathy   . PUD (peptic ulcer disease)   . Rectus sheath hematoma    02/18/18 8.9 x 4.5 cm then 02/19/18 7.9 x 4.5 cm   . Renal artery stenosis ([place])   . Renal artery stenosis ([place])   . Salzmann's nodular dystrophy 2012  . Sleep apnea   . Spinal stenosis    Social History   Socioeconomic History  . Marital status: Married    Spouse name: Not on file  . Number of children: Not on file  . Years of education: Not on file  . Highest education level: Not on file  Occupational History  . Not on file  Tobacco Use  . Smoking status: Former Smoker    Packs/day: 1.00    Years: 3.00    Pack years: 3.00    Types: Cigarettes    Quit date: 06/30/1962    Years since quitting: 57.3  . Smokeless tobacco: Former Systems developer    Types: [place] date: 12/05/1968  Substance and Sexual Activity  . Alcohol use: No  . Drug use: No  . Sexual activity: Not Currently  Other Topics Concern  . Not on file  Social History Narrative   Married    Social Determinants of Health   Financial Resource Strain:   . Difficulty of Paying Living Expenses: Not on file  Food Insecurity:   . Worried About Charity fundraiser in the Last Year: Not on file  . Ran Out of Food in the Last Year: Not on file  Transportation Needs:   . Lack of Transportation (Medical): Not on file  . Lack of Transportation (Non-Medical): Not on file  Physical  Activity: Inactive  . Days of Exercise per Week: 0 days  . Minutes of Exercise per Session: 0 min  Stress:   . Feeling of Stress : Not on file  Social Connections:   . Frequency of Communication with Friends and Family: Not on file  . Frequency of Social Gatherings with Friends and Family: Not on file  . Attends Religious Services: Not on file  .  Active Member of Clubs or Organizations: Not on file  . Attends Archivist Meetings: Not on file  . Marital Status: Not on file   Family History  Problem Relation Age of Onset  . CVA Father   . Stroke Father   . Lung cancer Mother   . Arthritis Mother   . Stomach cancer Sister   . Colon cancer Sister   . Diabetes Brother    Scheduled Meds: . aspirin EC  81 mg Oral Daily  . bumetanide (BUMEX) IV  2 mg Intravenous Once  . Chlorhexidine Gluconate Cloth  6 each Topical Daily  . clopidogrel  75 mg Oral Daily  . colchicine  0.6 mg Oral Daily  . dicyclomine  10 mg Oral TID AC  . enoxaparin (LOVENOX) injection  40 mg Subcutaneous Q24H  . gabapentin  600 mg Oral TID  . insulin aspart  0-20 Units Subcutaneous TID WC  . insulin aspart  6 Units Subcutaneous TID WC  . insulin glargine  60 Units Subcutaneous QHS  . Ipratropium-Albuterol  2 puff Inhalation Q4H  . methylPREDNISolone (SOLU-MEDROL) injection  40 mg Intravenous Q8H  . pantoprazole  40 mg Oral Daily  . pravastatin  40 mg Oral Daily  . probenecid  1,000 mg Oral BID   Continuous Infusions: . sodium chloride Stopped (10/09/19 1016)  . ceFEPime (MAXIPIME) IV 2 g (10/11/19 0318)  . [START ON 10/12/2019] vancomycin     PRN Meds:.sodium chloride, acetaminophen, alum & mag hydroxide-simeth, calcium carbonate, docusate sodium, guaiFENesin-dextromethorphan, ipratropium, morphine injection, ondansetron (ZOFRAN) IV, ondansetron, polyethylene glycol, traMADol Allergies  Allergen Reactions  . Allopurinol Diarrhea, Other (See Comments) and Nausea And Vomiting    Other Reaction: GI  Upset  . Atenolol Other (See Comments)    Other Reaction: bradycardia  . Atorvastatin Other (See Comments)    Other Reaction: muscle aches  . Ramipril Rash  . Rosuvastatin Other (See Comments)    Muscle aches  . Simvastatin Other (See Comments)    Other Reaction: MUSCLE ACHES (Zocor)  . Valsartan Other (See Comments)    Other Reaction: facial swelling  . Cardizem [Diltiazem] Rash   Review of Systems  Constitutional: Positive for activity change.  Respiratory: Negative for shortness of breath.   Neurological: Positive for weakness.    Physical Exam Vitals and nursing note reviewed.  Constitutional:      General: He is not in acute distress.    Appearance: He is ill-appearing.  Cardiovascular:     Rate and Rhythm: Normal rate and regular rhythm.     Comments: HR 50-60s Pulmonary:     Effort: Tachypnea present. No accessory muscle usage.     Breath sounds: Decreased breath sounds present.     Comments: Denies feeling of labored breathing Abdominal:     Palpations: Abdomen is soft.  Neurological:     Mental Status: He is alert and oriented to person, place, and time.     Vital Signs: BP 139/60   Pulse (!) 57   Temp 98.7 F (37.1 C) (Oral)   Resp (!) 30   Ht 5' 8"  (1.727 m)   Wt 79.4 kg   SpO2 96%   BMI 26.62 kg/m  Pain Scale: 0-10   Pain Score: 0-No pain   SpO2: SpO2: 96 % O2 Device:SpO2: 96 % O2 Flow Rate: .O2 Flow Rate (L/min): 15 L/min  IO: Intake/output summary:   Intake/Output Summary (Last 24 hours) at 10/11/2019 0745 Last data filed at 10/11/2019 0600 Gross per  24 hour  Intake 1022.72 ml  Output 1000 ml  Net 22.72 ml    LBM: Last BM Date: 10/10/19 Baseline Weight: Weight: 75.8 kg Most recent weight: Weight: 79.4 kg     Palliative Assessment/Data:     Time In: 1415 Time Out: 1530 Time Total: 75 min Greater than 50%  of this time was spent counseling and coordinating care related to the above assessment and plan.  Signed by: Vinie Sill, NP Palliative Medicine Team Pager # [phone] (M-F 8a-5p) Team Phone # [phone] (Nights/Weekends)

## 2019-10-11 NOTE — Progress Notes (Signed)
Spoke with Primary RN Clarene Critchley re: PICC placement, patient has 2 working PIVs, PICC might not be needed at this time. Per RN, PICC placement to put a hold until tomorrow 1/29. Will follow up.

## 2019-10-11 NOTE — Progress Notes (Signed)
PT Cancellation Note  Patient Details Name: Chase Cabrera MRN: [id number] DOB: 1946-06-17   Cancelled Treatment:    Reason Eval/Treat Not Completed: Medical issues which prohibited therapy; Pt with a decline in status and transferred to the ICU.  Will complete PT orders at this time but will reassess pt pending a change in status upon receipt of new PT orders.    Linus Salmons PT, DPT 10/11/19, 10:45 AM

## 2019-10-11 NOTE — Progress Notes (Signed)
Pharmacy Antibiotic Note  Chase Cabrera is a 74 y.o. male admitted on 09/19/2019 with acute hypoxic respiratory failure.  PMH includes CHF, CAD, Crohn's disease, diabetes, GERD, T2DM, MI, bronchiectasis, OSA not on CPAP, HTN, HLD, Afib, COVID-19 (08/2019). BCx collected on 01/25 have not shown any growth. UCx from 01/25 showed growth of E. faecalis. Procalcitonin today was 0/29. WBCs are higher than yesterday and pt is afebrile. Pt received cefepime 2g IV and loading dose of vancomycin 2g IV this morning. Per am labs, discussions were made to switch to amoxicillin to treat UTI. Pharmacy has been consulted for amoxicillin dosing.  Plan: Initiate amoxicillin 550m PO Q8H for 5 days for UTI with growth of E. faecalis. Start today 1400; End of therapy: 02/02   Will continue to monitor and de-escalate therapy according to clinical presentation and cultures.   Antibiotic day so far: 1  Height: 5' 8"  (172.7 cm) Weight: 175 lb 0.7 oz (79.4 kg) IBW/kg (Calculated) : 68.4  Temp (24hrs), Avg:98.3 F (36.8 C), Min:97.5 F (36.4 C), Max:98.7 F (37.1 C)  Recent Labs  Lab 09/24/2019 1452 09/18/2019 1600 10/09/19 0039 10/09/19 0051 10/10/19 0555 10/11/19 0045 10/11/19 0531 10/11/19 0844  WBC 9.8  --   --  11.8* 10.6* 16.9*  --   --   CREATININE 1.05  --   --  1.10 0.99 1.18  --   --   LATICACIDVEN  --  1.9 1.5  --   --   --  1.7 1.3    Estimated Creatinine Clearance: 52.3 mL/min (by C-G formula based on SCr of 1.18 mg/dL).    Allergies  Allergen Reactions  . Allopurinol Diarrhea, Other (See Comments) and Nausea And Vomiting    Other Reaction: GI Upset  . Atenolol Other (See Comments)    Other Reaction: bradycardia  . Atorvastatin Other (See Comments)    Other Reaction: muscle aches  . Ramipril Rash  . Rosuvastatin Other (See Comments)    Muscle aches  . Simvastatin Other (See Comments)    Other Reaction: MUSCLE ACHES (Zocor)  . Valsartan Other (See Comments)    Other Reaction: facial  swelling  . Cardizem [Diltiazem] Rash    Antimicrobials this admission: Vancomycin 1g IV 01/25 >> 01/25 Cefepime 2g IV 01/25 >> 01/25 Vancomycin 2g IV 01/28 >> 01/28 Cefepime 2g IV 01/28 >> 01/28 Amoxicillin 5051mPO 01/28 >> 02/02   Dose adjustments this admission: No dose adjustment needed at this time.   Microbiology results: 01/28 BCx: No growth <12HR 01/28 BCx: No growth <12HR 01/25 BCx: No growth for 3 days 01/25 BCx: No growth for 3 days 01/25 UCx: >/= 100,000 colonies/mL, E. faecalis     Thank you for allowing pharmacy to be a part of this patient's care.  SuRoanna Banning/28/2021 12:44 PM

## 2019-10-12 DIAGNOSIS — I10 Essential (primary) hypertension: Secondary | ICD-10-CM

## 2019-10-12 DIAGNOSIS — Z66 Do not resuscitate: Secondary | ICD-10-CM

## 2019-10-12 LAB — CBC
HCT: 39.8 % (ref 39.0–52.0)
Hemoglobin: 13.1 g/dL (ref 13.0–17.0)
MCH: 33.9 pg (ref 26.0–34.0)
MCHC: 32.9 g/dL (ref 30.0–36.0)
MCV: 103.1 fL — ABNORMAL HIGH (ref 80.0–100.0)
Platelets: 319 10*3/uL (ref 150–400)
RBC: 3.86 MIL/uL — ABNORMAL LOW (ref 4.22–5.81)
RDW: 14.6 % (ref 11.5–15.5)
WBC: 15 10*3/uL — ABNORMAL HIGH (ref 4.0–10.5)
nRBC: 0 % (ref 0.0–0.2)

## 2019-10-12 LAB — BASIC METABOLIC PANEL
Anion gap: 12 (ref 5–15)
BUN: 22 mg/dL (ref 8–23)
CO2: 29 mmol/L (ref 22–32)
Calcium: 8.7 mg/dL — ABNORMAL LOW (ref 8.9–10.3)
Chloride: 96 mmol/L — ABNORMAL LOW (ref 98–111)
Creatinine, Ser: 0.94 mg/dL (ref 0.61–1.24)
GFR calc Af Amer: >60 mL/min (ref >60)
GFR calc non Af Amer: >60 mL/min (ref >60)
Glucose, Bld: 145 mg/dL — ABNORMAL HIGH (ref 70–99)
Potassium: 3.8 mmol/L (ref 3.5–5.1)
Sodium: 137 mmol/L (ref 135–145)

## 2019-10-12 LAB — MAGNESIUM: Magnesium: 2.4 mg/dL (ref 1.7–2.4)

## 2019-10-12 LAB — GLUCOSE, CAPILLARY
Glucose-Capillary: 137 mg/dL — ABNORMAL HIGH (ref 70–99)
Glucose-Capillary: 153 mg/dL — ABNORMAL HIGH (ref 70–99)
Glucose-Capillary: 172 mg/dL — ABNORMAL HIGH (ref 70–99)
Glucose-Capillary: 149 mg/dL — ABNORMAL HIGH (ref 70–99)

## 2019-10-12 LAB — PHOSPHORUS: Phosphorus: 2.4 mg/dL — ABNORMAL LOW (ref 2.5–4.6)

## 2019-10-12 MED ORDER — FAMOTIDINE 20 MG PO TABS
20.0000 mg | ORAL_TABLET | Freq: Every day | ORAL | Status: DC
  Administered 2019-10-12: 20 mg via ORAL
  Filled 2019-10-12: qty 1

## 2019-10-12 MED ORDER — BENZONATATE 100 MG PO CAPS
200.0000 mg | ORAL_CAPSULE | Freq: Three times a day (TID) | ORAL | Status: DC | PRN
  Administered 2019-10-12 – 2019-10-13 (×2): 200 mg via ORAL
  Filled 2019-10-12 (×2): qty 2

## 2019-10-12 MED ORDER — POTASSIUM PHOSPHATE MONOBASIC 500 MG PO TABS
500.0000 mg | ORAL_TABLET | Freq: Three times a day (TID) | ORAL | Status: DC
  Administered 2019-10-13 (×2): 500 mg via ORAL
  Filled 2019-10-12 (×6): qty 1

## 2019-10-12 NOTE — Progress Notes (Signed)
Daily Progress Note   Patient Name: Chase Cabrera       Date: 10/12/2019 DOB: 04-24-1946  Age: 74 y.o. MRN#: [id number] Attending Physician: Chase Mandes, MD Primary Care Physician: McLean-Scocuzza, Nino Glow, MD Admit Date: 09/21/2019  Reason for Consultation/Follow-up: Establishing goals of care  Subjective: Patient resting comfortably, switched to HFNC. Son, Chase Cabrera, at bedside.   Length of Stay: 4  Current Medications: Scheduled Meds:  . amoxicillin  500 mg Oral Q8H  . vitamin C  500 mg Oral Q24H  . aspirin EC  81 mg Oral Daily  . B-complex with vitamin C  1 tablet Oral Q24H  . budesonide (PULMICORT) nebulizer solution  0.5 mg Nebulization BID  . bumetanide (BUMEX) IV  2 mg Intravenous Once  . Chlorhexidine Gluconate Cloth  6 each Topical Daily  . clopidogrel  75 mg Oral Daily  . colchicine  0.6 mg Oral Daily  . dicyclomine  10 mg Oral TID AC  . enoxaparin (LOVENOX) injection  40 mg Subcutaneous Q24H  . famotidine  20 mg Oral Daily  . gabapentin  600 mg Oral TID  . insulin aspart  0-20 Units Subcutaneous TID WC  . insulin aspart  6 Units Subcutaneous TID WC  . insulin glargine  60 Units Subcutaneous QHS  . ipratropium-albuterol  3 mL Nebulization Q4H  . methylPREDNISolone (SOLU-MEDROL) injection  40 mg Intravenous Q8H  . multivitamin with minerals  1 tablet Oral Q24H  . pantoprazole  40 mg Oral Daily  . pravastatin  40 mg Oral Daily  . probenecid  1,000 mg Oral BID  . zinc sulfate  220 mg Oral Q24H    Continuous Infusions: . sodium chloride Stopped (10/09/19 1016)    PRN Meds: sodium chloride, acetaminophen, alum & mag hydroxide-simeth, calcium carbonate, docusate sodium, guaiFENesin-dextromethorphan, ipratropium, morphine injection, ondansetron (ZOFRAN) IV, ondansetron,  polyethylene glycol, traMADol  Physical Exam Constitutional:      General: He is not in acute distress. HENT:     Head: Normocephalic and atraumatic.  Cardiovascular:     Rate and Rhythm: Normal rate.  Pulmonary:     Effort: Pulmonary effort is normal.  Musculoskeletal:     Right lower leg: No edema.     Left lower leg: No edema.  Skin:    General: Skin is warm and dry.  Neurological:     Mental Status: He is alert.     Comments: Pleasantly confused            Vital Signs: BP (!) 169/86   Pulse 66   Temp 98.9 F (37.2 C) (Oral)   Resp (!) 31   Ht 5' 8"  (1.727 m)   Wt 79.4 kg   SpO2 96%   BMI 26.62 kg/m  SpO2: SpO2: 96 % O2 Device: O2 Device: Bi-PAP O2 Flow Rate: O2 Flow Rate (L/min): 15 L/min  Intake/output summary:   Intake/Output Summary (Last 24 hours) at 10/12/2019 1010 Last data filed at 10/12/2019 0500 Gross per 24 hour  Intake 110 ml  Output 1300 ml  Net -1190 ml   LBM: Last BM Date: 10/10/19 Baseline Weight: Weight: 75.8 kg Most recent weight: Weight: 79.4 kg       Palliative  Assessment/Data: PPS 40%      Patient Active Problem List   Diagnosis Date Noted  . Goals of care, counseling/discussion   . DNR (do not resuscitate)   . Palliative care encounter   . Uncontrolled type 2 diabetes mellitus with hyperglycemia (Chase Cabrera)   . Acute respiratory failure with hypoxemia (Chase Cabrera) 09/25/2019  . Pressure injury of skin 09/12/2019  . Pneumonia due to COVID-19 virus 09/09/2019  . Acute respiratory failure with hypoxia ([place]) 09/05/2019  . AKI (acute kidney injury) (Chase Cabrera) 09/05/2019  . GERD (gastroesophageal reflux disease) 09/05/2019  . Hyponatremia 09/05/2019  . COVID-19 09/04/2019  . Chronic diastolic CHF (congestive heart failure) (Chase Cabrera) 07/05/2019  . Dementia (Chase Cabrera) 02/15/2019  . BPH (benign prostatic hyperplasia) 02/15/2019  . STEMI (ST elevation myocardial infarction) (Chase Cabrera) 09/16/2018  . Acute ST elevation myocardial infarction (STEMI) of lateral wall  (Chase Cabrera) 09/16/2018  . Diarrhea 08/09/2018  . Allergic rhinitis 08/08/2018  . Bronchiectasis without complication (Chase Cabrera) 17/91/5056  . TIA (transient ischemic attack) 04/30/2018  . Abnormal thyroid function test 04/03/2018  . Leukocytosis 04/03/2018  . Hematoma of rectus sheath 02/21/2018  . Carotid artery stenosis 02/16/2018  . Altered mental status 02/09/2018  . Hypokalemia 01/20/2018  . OSA (obstructive sleep apnea) 01/12/2018  . Osteoarthritis of right hip 12/13/2017  . Intraabdominal mass 11/23/2017  . Colonic mass 11/18/2017  . Crohn's disease (Chase Cabrera) 10/20/2017  . Pulmonary nodule 10/07/2017  . History of skin cancer 10/07/2017  . Rib pain on right side 03/07/2017  . Postnasal drip 09/09/2016  . Osteoarthritis 04/08/2016  . Spinal stenosis, lumbar 03/25/2016  . DM type 2 with diabetic peripheral neuropathy (Chase Cabrera) 07/15/2014  . Gout 05/04/2013  . Renal artery stenosis (Chase Cabrera) 07/01/2011  . Atrial fibrillation (Chase Cabrera) 06/30/2011  . CAD (coronary artery disease) 06/30/2011  . Hyperlipidemia 06/30/2011  . HTN (hypertension), benign 06/30/2011    Palliative Care Assessment & Plan   HPI: 74 y.o. male  with past medical history of bronchiectasis, MI, CAD with multiple stents, combined CHF EF 45-50%, HTN, OSA not on CPAP, diabetes, Crohn's disease, gout, recent COVID infection/pneumonia with admission 09/09/19-09/25/19 admitted on 09/15/2019 with worsening shortness of breath due to bronchiectasis requiring HFNC. Early 1/28 he required transition to ICU for BiPAP support.   Assessment: Follow up with patient today - he has been transitioned off of bipap to HFNC, now eating. Appears comfortable.   Visitation policy questions addressed.   For now, goals remain clear to continue current care but if [name] were to worsen he would not be interested in intubation and would want [place] to focus on keeping him comfortable at end of life.   Discussed that PMT will follow up again next  week.  Recommendations/Plan:  Continue current care - DNI/DNR - comfort focus if he declines  PMT to follow up next week  Goals of Care and Additional Recommendations:  No intubation  Code Status:  DNR  Prognosis:   Guarded prognosis d/t severe lung disease and high oxygen requirements  Discharge Planning:  To Be Determined  Care plan was discussed with RN and son  Thank you for allowing the Palliative Medicine Team to assist in the care of this patient.   Total Time 15 minutes Prolonged Time Billed  no       Greater than 50%  of this time was spent counseling and coordinating care related to the above assessment and plan.  Juel Burrow, DNP, [facility] Palliative Medicine Team Team Phone # [phone]  Pager [phone]

## 2019-10-12 NOTE — Progress Notes (Signed)
[place] visited pt. while rounding on ICU as follow up from yesterday's visit; pt. in bed sitting up; no longer wearing O2 mask; able to speak and reported 'feeling better'.  Pt.'s son Marlou Sa) @ bedside working on laptop.  Pt. and son appear to be coping effectively; son understands one-visitor policy; pt. requested diet cola, which [facility] procured after checking with charge RN.  No further needs expressed at this time.       10/12/19 1500  Clinical Encounter Type  Visited With Patient and family together;Health care provider  Visit Type Follow-up;Social support  Referral From  (RT)  Spiritual Encounters  Spiritual Needs Emotional  Stress Factors  Patient Stress Factors Health changes;Major life changes  Family Stress Factors Major life changes;Health changes

## 2019-10-12 NOTE — Consult Note (Signed)
PHARMACY CONSULT NOTE - FOLLOW UP  Pharmacy Consult for Electrolyte Monitoring and Replacement   Chase C Tiptonis a69 y.o.maleadmittedon 01/06/2021with acute hypoxic respiratory failure.PMH includes CHF, CAD, Crohn's disease, diabetes, GERD, T2DM, MI, bronchiectasis, OSA not on CPAP, HTN, HLD, Afib, COVID-19 (08/2019). Pharmacy consulted for monitoring and replacing electrolytes.  Recent Labs: Potassium (mmol/L)  Date Value  10/12/2019 3.8   Magnesium (mg/dL)  Date Value  10/12/2019 2.4   Calcium (mg/dL)  Date Value  10/12/2019 8.7 (L)   Albumin (g/dL)  Date Value  10/09/2019 3.0 (L)   Phosphorus (mg/dL)  Date Value  10/12/2019 2.4 (L)   Sodium (mmol/L)  Date Value  10/12/2019 137     Assessment: 1. Electrolytes: Electrolytes WNL. Corrected calcium is 9.5 (WNL). Pt received 1 dose of potassium chloride 40 mEq yesterday. Will receive potassium phosphate 500 mg PO TID x 3 doses. Will continue to monitor electrolytes in am labs. Will replace electrolytes to achieve potassium ~4, magnesium ~2, and sodium ~140.   2. Glucose:  Today's range: 137-153. Glucose-capillary monitored Q4H. Pt is on insulin glargine 60 units [place] QHS, insulin aspart 0-20 units [place] TID WM, and insulin aspart 6 units [place] TID WM. Pt is also on Solu-Medrol 24m IV Q8H. Continue insulin administrations and monitor glucose levels daily.   3. Constipation: Last BM: 01/27. Pt is on docusate sodium 1075mPO BID PRN and miralax PO BID PRN. Continue to monitor.     SuRoanna BanningPharmD Candidate 10/12/2019 2:24 PM

## 2019-10-12 NOTE — Progress Notes (Addendum)
Switched from BiPAP to HF oxygen of 90%. Remained calm and oriented all day until attempted OOB.  Patients ability to continue on HF oxygen decreased as he became tired. Patient has no oxygen reserve to ambulate or move much in bed. After becoming confused and combative placed back on BiPAP at 100%. Patient tired from a busy day on HF oxygen.

## 2019-10-12 NOTE — Progress Notes (Signed)
[facility] [place] NAME: Chase Cabrera    MR#:  [id number]  DATE OF BIRTH:  06/05/1946  SUBJECTIVE:  patient now on HFNC 85 % fio2  Son at bedside Feels better today although desats easily with minimal exertion REVIEW OF SYSTEMS:   Review of Systems  Constitutional: Positive for malaise/fatigue. Negative for chills, fever and weight loss.  HENT: Negative for ear discharge, ear pain and nosebleeds.   Eyes: Negative for blurred vision, pain and discharge.  Respiratory: Positive for shortness of breath. Negative for sputum production, wheezing and stridor.   Cardiovascular: Negative for chest pain, palpitations, orthopnea and PND.  Gastrointestinal: Negative for abdominal pain, diarrhea, nausea and vomiting.  Genitourinary: Negative for frequency and urgency.  Musculoskeletal: Negative for back pain and joint pain.  Neurological: Positive for weakness. Negative for sensory change, speech change and focal weakness.  Psychiatric/Behavioral: Negative for depression and hallucinations. The patient is not nervous/anxious.    Tolerating Diet: yes --some Tolerating PT: SNF  DRUG ALLERGIES:   Allergies  Allergen Reactions  . Allopurinol Diarrhea, Other (See Comments) and Nausea And Vomiting    Other Reaction: GI Upset  . Atenolol Other (See Comments)    Other Reaction: bradycardia  . Atorvastatin Other (See Comments)    Other Reaction: muscle aches  . Ramipril Rash  . Rosuvastatin Other (See Comments)    Muscle aches  . Simvastatin Other (See Comments)    Other Reaction: MUSCLE ACHES (Zocor)  . Valsartan Other (See Comments)    Other Reaction: facial swelling  . Cardizem [Diltiazem] Rash    VITALS:  Blood pressure (!) 132/59, pulse 73, temperature 98.5 F (36.9 C), resp. rate (!) 29, height 5' 8"  (1.727 m), weight 79.4 kg, SpO2 (!) 88 %.  PHYSICAL EXAMINATION:   Physical Exam  GENERAL:  74 y.o.-year-old patient lying in the  bed with mild acute resp distress.  EYES: Pupils equal, round, reactive to light and accommodation. HEENT: Head atraumatic, normocephalic. Oropharynx and nasopharynx clear. HFNC+  LUNGS: diminished breath sounds bilaterally, no wheezing, rales,occ rhonchi. No use of accessory muscles of respiration.  CARDIOVASCULAR: S1, S2 normal. No murmurs, rubs, or gallops.  ABDOMEN: Soft, nontender, nondistended. Bowel sounds present. No organomegaly or mass.  EXTREMITIES: No cyanosis, clubbing o -+edema b/l.    NEUROLOGIC:  Grossly No focal Motor or sensory deficits b/l.   PSYCHIATRIC:  patient is alert and awake  SKIN: No obvious rash, lesion, or ulcer.   LABORATORY PANEL:  CBC Recent Labs  Lab 10/12/19 0416  WBC 15.0*  HGB 13.1  HCT 39.8  PLT 319    Chemistries  Recent Labs  Lab 09/14/2019 1452 10/09/19 0051 10/12/19 0416  NA 131*   < > 137  K 3.3*   < > 3.8  CL 93*   < > 96*  CO2 28   < > 29  GLUCOSE 314*   < > 145*  BUN 13   < > 22  CREATININE 1.05   < > 0.94  CALCIUM 8.6*   < > 8.7*  MG  --    < > 2.4  AST 56*  --   --   ALT 25  --   --   ALKPHOS 51  --   --   BILITOT 1.3*  --   --    < > = values in this interval not displayed.   Cardiac Enzymes No results for input(s): TROPONINI in the last 168  hours. RADIOLOGY:  CT ANGIO CHEST PE W OR WO CONTRAST  Result Date: 10/11/2019 CLINICAL DATA:  Shortness of breath with sign 0 CIS EXAM: CT ANGIOGRAPHY CHEST WITH CONTRAST TECHNIQUE: Multidetector CT imaging of the chest was performed using the standard protocol during bolus administration of intravenous contrast. Multiplanar CT image reconstructions and MIPs were obtained to evaluate the vascular anatomy. CONTRAST:  72m OMNIPAQUE IOHEXOL 350 MG/ML SOLN COMPARISON:  Three days ago FINDINGS: Cardiovascular: Cardiomegaly. Extensive aortic and coronary atherosclerosis status post CABG. No pulmonary artery filling defect or acute aortic finding. Mediastinum/Nodes: Negative for adenopathy  or mass. No pneumomediastinum Lungs/Pleura: Patchy ground-glass opacity superimposed on fibrotic appearing coarse interstitial opacities with low lung volumes. This alveolitis appearance has progressed. No consolidation, effusion, or generalized septal thickening. Calcified granulomas in the left lower lobe. Upper Abdomen: Granulomatous calcifications. Cholecystectomy and right renal artery stenting. Musculoskeletal: No acute or aggressive finding. Review of the MIP images confirms the above findings. IMPRESSION: 1. Progressive pneumonitis compared to 3 days ago. There also some coarse opacities with mild traction bronchiectasis suggesting superimposed fibrotic process. 2. Negative for pulmonary embolism. Electronically Signed   By: JMonte FantasiaM.D.   On: 10/11/2019 05:13   DG Chest Port 1 View  Result Date: 10/10/2019 CLINICAL DATA:  Shortness of breath. Smoking history. Coronavirus infection. EXAM: PORTABLE CHEST 1 VIEW COMPARISON:  09/21/2019 FINDINGS: Radiographic worsening of widespread patchy bronchopneumonia throughout both lungs. No dense consolidation or lobar collapse. No visible effusion. Previous median sternotomy and CABG. Cardiomegaly. IMPRESSION: Radiographic worsening of widespread pulmonary infiltrates. Electronically Signed   By: MNelson ChimesM.D.   On: 10/10/2019 18:44   [place] SITE RITE  Result Date: 10/11/2019 If Site Rite image not attached, placement could not be confirmed due to current cardiac rhythm.  ASSESSMENT AND PLAN:  Chase BeseckerTiptonis a 74y.o.Caucasianmalewith medical history significant ofbronchiectasis, OSA not on CPAP, DM2 on insulin, MI, CAD status post multiple stents, HTN, gout who presented with worsening shortness of breath.    # Acute hypoxic respiratory failure # Recent COVID-19 infection # Hx ofBronchiectasis --On presentation, had fever and needed 6L O2 to maintain sats in low 90%. --Dxof Covid on 09/04/19 and was discharged on 09/25/2019  after receiving treatment with remdesivir,Actemra, and Decadron which he completed on 10/06/19 and3 L O2. Was doing well until day of presentation. -Superimposed PNA is less likely since procal is neg, and CT chest didn't show acute findings or consolidations but has worsening chronic lung disease findings/fibrosis/scarring --CRP 12.3, up from 1.3 on 1/12. --continue suppl O2 to maintain sats >90% wean down to keep sats >88%. Currently on HFNC with prn BIPAP --Hold off abx --cont  IV solu-medrol 40 mg q8h  --recieved 100 mg lasix last pm with good uop --Pulm consult with dr kMortimer Friesappreciated. Pt needs to be in ICU for now. -On po Augmentin day 2  # Hx ofBronchiectasis  # Hx of OSA --Pt has declined CPAP in the past  # DM2 on insulin # Neuropathy --continue homeLantusas 60unightly (90u at home) --SSI and Aspart TID --continue home gabapentin  # Hx of mild systolic CHF # Hx of pulm HTN --Last echo on 09/16/18 showed LVEF 45-50% --cont IV Lasix prn as BP allows.  # Hx of MI's and CAD s/p stents --continue home ASA, plavix and statin  # HTN --Hold home BP meds due to BP low on presentation. Resume as BP allows.  # Hx of gout --continue home colchicine and Probenecid  #  GERD --continue home PPI  PT to see once stable from respiratory wise  Palliative care to met with pt and son and pt is now DNR. He is in agreement with comfort care if his condition declines  DVT prophylaxis: Lovenox SQ Code Status: DNR (10/11/2019--d/w with palliative care) Family Communication: son at bedside Disposition Plan: TBD mostly rehab Barriers to d/c: transferred to ICU given worsening respiratory status  TOTAL TIME TAKING CARE OF THIS PATIENT: *25* minutes.  >50% time spent on counselling and coordination of care  Note: This dictation was prepared with Dragon dictation along with smaller phrase technology. Any transcriptional errors that result from this process are  unintentional.  Fritzi Mandes M.D    [facility]   CC: Primary care physician; McLean-Scocuzza, Nino Glow, MDPatient ID: [name], male   DOB: 1946-08-01, 74 y.o.   MRN: [id number]

## 2019-10-12 NOTE — Progress Notes (Signed)
Attempted OOB to charge. Patient did not tolerate. ,Placed back on BiPAP at 100% until RT could come assess why the HF oxygen was beeping . Problem solved with HF oxygen. Prn Morphine given to ,slow respirations and calm patient.

## 2019-10-12 NOTE — Progress Notes (Signed)
Patient requested to be placed back on HHFNC, WOB improved.

## 2019-10-12 NOTE — Progress Notes (Signed)
Pharmacy Antibiotic Note  Chase Cabrera is a 74 y.o. male admitted on 10/06/2019 with acute hypoxic respiratory failure. PMH includes CHF, CAD, Crohn's disease, diabetes, GERD, T2DM, MI, bronchiectasis, OSA not on CPAP, HTN, HLD, Afib, COVID-19 (08/2019). BCx collected on 01/25 have not shown any growth. UCx from 01/25 showed growth of E. faecalis. Procalcitonin today was 0/29. WBCs are lower than yesterday and pt is afebrile. Per am labs yesterday, discussions were made to switch to amoxicillin to treat UTI. Pharmacy has been consulted for amoxicillin dosing.  Plan: Initiate amoxicillin 520m PO Q8H for 5 days for UTI with growth of E. faecalis. End of therapy: 02/02   Will continue to monitor and de-escalate therapy according to clinical presentation and cultures.   Antibiotic day so far: 2 out of 5  Height: 5' 8"  (172.7 cm) Weight: 175 lb 0.7 oz (79.4 kg) IBW/kg (Calculated) : 68.4  Temp (24hrs), Avg:98.7 F (37.1 C), Min:98.5 F (36.9 C), Max:98.9 F (37.2 C)  Recent Labs  Lab 09/14/2019 1452 09/26/2019 1600 10/09/19 0039 10/09/19 0051 10/10/19 0555 10/11/19 0045 10/11/19 0531 10/11/19 0844 10/12/19 0416  WBC 9.8  --   --  11.8* 10.6* 16.9*  --   --  15.0*  CREATININE 1.05  --   --  1.10 0.99 1.18  --   --  0.94  LATICACIDVEN  --  1.9 1.5  --   --   --  1.7 1.3  --     Estimated Creatinine Clearance: 65.7 mL/min (by C-G formula based on SCr of 0.94 mg/dL).    Allergies  Allergen Reactions  . Allopurinol Diarrhea, Other (See Comments) and Nausea And Vomiting    Other Reaction: GI Upset  . Atenolol Other (See Comments)    Other Reaction: bradycardia  . Atorvastatin Other (See Comments)    Other Reaction: muscle aches  . Ramipril Rash  . Rosuvastatin Other (See Comments)    Muscle aches  . Simvastatin Other (See Comments)    Other Reaction: MUSCLE ACHES (Zocor)  . Valsartan Other (See Comments)    Other Reaction: facial swelling  . Cardizem [Diltiazem] Rash     Antimicrobials this admission: Vancomycin 1g IV 01/25 >> 01/25 Cefepime IV 01/25 >> 01/25 Vancomycin IV 01/28 >> 01/28 Cefepime IV 01/28 >> 01/28 Amoxicillin PO 01/28 >> 02/02   Dose adjustments this admission: No dose adjustment needed at this time.   Microbiology results: 01/28 BCx: No growth for 1 day 01/28 BCx: No growth 1 day 01/25 BCx: No growth for 4 days 01/25 BCx: No growth for 4 days 01/25 UCx: >/= 100,000 colonies/mL, E. faecalis     Thank you for allowing pharmacy to be a part of this patient's care.  SRoanna Banning PharmD Candidate 10/12/2019 2:16 PM

## 2019-10-12 NOTE — Progress Notes (Signed)
Spoke with Primary RN re: PICC order, patient has 2 working PIVs. PICC might not be needed. Will follow up.

## 2019-10-12 NOTE — Progress Notes (Addendum)
RN attempted to get patient up and to chair at bedside but patient became hypoxic and SOB. RT called to come in and assess patient and bipap/ HHFNC. Left pt on bipap at this time to help his WOB and oxygen saturation.

## 2019-10-12 NOTE — Consult Note (Signed)
Name: JEREME LOREN MRN: [id number] DOB: [birth year]    ADMISSION DATE:  10/07/2019 CONSULTATION DATE:  10/11/2019  REFERRING MD :  Sharion Settler, NP  CHIEF COMPLAINT:  Acute Respiratory Distress, Hypoxia  BRIEF PATIENT DESCRIPTION:  74 year old male admitted 10/01/2019 with acute on chronic hypoxic respiratory failure secondary to post COVID-19 pneumonitis/fibrosis.  On 1/28 he was noted to have acute respiratory distress and severe hypoxia requiring transfer to stepdown for BiPAP.  CTA chest 1/28 negative for PE, however with progressive pneumonitis/fibrosis. Questionable superimposed pneumonia. High risk for intubation.  SIGNIFICANT EVENTS  1/25-admission to [place] 1/26- PCCM consulted for worsening hypoxia 1/27- Transferred to [place] 1/28- Acute Respiratory Distress & Hypoxia; transfer back to Stepdown for BiPAP  STUDIES:  1/25- CT Head w/o Contrast>>1. Stable age related cerebral atrophy, ventriculomegaly and periventricular white matter disease. 2. No acute intracranial findings or mass lesions. 1/25- CTA Chest>> 1. No evidence of acute pulmonary embolism or other acute vascular process. 2. Chronic lung disease with progressive subpleural reticulation and patchy ground-glass opacities compared with previous CT of 06/13/2018. These findings likely relate to the patient's recent COVID-19 infection (residual inflammation versus postinflammatory scarring). 3. Sequela of prior granulomatous disease. 4. Stable central enlargement of the pulmonary arteries consistent with pulmonary arterial hypertension. Coronary and Aortic Atherosclerosis 1/28- CTA Chest>> 1. Progressive pneumonitis compared to 3 days ago. There also some coarse opacities with mild traction bronchiectasis suggesting superimposed fibrotic process. 2. Negative for pulmonary embolism.  CULTURES: Blood cultures 1/25>> no growth to date Urine 1/25>> Enterococcus faecalis Blood cultures 1/28>> Strep pneumo  urinary antigen 1/28>> Legionella urinary antigen 1/28>>  ANTIBIOTICS: Vancomycin 1/25>> 1/25 Cefepime 1/28>> Vancomycin 1/28>>  CC FOLLOW UP RESP FAILURE   HISTORY OF PRESENT ILLNESS:   Remains on biPAP Patient is DNR/DNI Plan to wean off biPAP     PAST MEDICAL HISTORY :   has a past medical history of Arthritis, Atrial fibrillation ([place]), CAD (coronary artery disease), CHF (congestive heart failure) ([place]), Chicken pox, Colon polyp, Corneal dystrophy, COVID-19, Crohn's disease ([place]), Diabetes ([place]) (2002), Dysrhythmia, GERD (gastroesophageal reflux disease), Gout, History of kidney stones, History of shingles, Hyperlipemia, Hypertension, Kidney stones, Myocardial infarction ([place]), Peripheral neuropathy, Peripheral neuropathy, Peripheral neuropathy, PUD (peptic ulcer disease), Rectus sheath hematoma, Renal artery stenosis ([place]), Renal artery stenosis ([place]), Salzmann's nodular dystrophy (2012), Sleep apnea, and Spinal stenosis.  has a past surgical history that includes GALLBLADER; Cardiac catheterization; Coronary angioplasty; Coronary angioplasty with stent (03/28/2015); Cardiac electrophysiology study and ablation; Cardioversion; Back surgery; Total hip arthroplasty (Left, 11/11/2015); Rotator cuff repair (Right, 2006); Renal artery stent (Right); foot and ankle repair (Right, 2005); Tonsillectomy; Knee arthroscopy (Right); Colonoscopy with propofol (N/A, 04/09/2016); Fracture surgery; APPLICATION VERTERBRAL DEFECT PROSTHETIC (05/01/2013); ARTHRODESIS ANTERIOR LUMBAR SPINE (05/01/2013); Cholecystectomy; CORNEAL EYE SURGERY (Bilateral, 07/28/11    09/22/2011); LUMBAR SPINE FUSION ONE LEVEL (05/03/2013); TRANSCATH PLACEMENT INTRAVASCULAR STENT LEG (03/2015); Coronary artery bypass graft; Cardiac surgery; Ablation (2012); Total hip arthroplasty (Right, 12/13/2017); Joint replacement; Joint replacement; Eye surgery; Coronary/Graft Acute MI Revascularization (N/A, 09/16/2018); and LEFT HEART CATH AND  CORONARY ANGIOGRAPHY (N/A, 09/16/2018). Prior to Admission medications   Medication Sig Start Date End Date Taking? Authorizing Provider  Alpha-Lipoic Acid 600 MG CAPS Take 1 capsule (600 mg total) by mouth 2 (two) times daily. 10/02/19  Yes McLean-Scocuzza, Nino Glow, MD  amitriptyline (ELAVIL) 50 MG tablet Take 1 tablet (50 mg total) by mouth at bedtime as needed. 10/03/19  Yes McLean-Scocuzza, Nino Glow, MD  amLODipine (NORVASC) 5 MG tablet Take 1  tablet (5 mg total) by mouth 2 (two) times daily. If blood pressure >140/>90 make take another 1 pill of 5 mg 05/01/18  Yes Mayo, Pete Pelt, MD  aspirin EC 81 MG EC tablet Take 1 tablet (81 mg total) by mouth daily. 05/02/18  Yes Mayo, Pete Pelt, MD  carvedilol (COREG) 25 MG tablet Take 1 tablet (25 mg total) by mouth 2 (two) times daily with a meal. 03/27/19  Yes McLean-Scocuzza, Nino Glow, MD  Cholecalciferol 1.25 MG (50000 UT) TABS Take 1 tablet by mouth once a week. 07/12/19  Yes McLean-Scocuzza, Nino Glow, MD  cloNIDine (CATAPRES) 0.2 MG tablet Take 1 tablet (0.2 mg total) by mouth 2 (two) times daily. 07/11/19  Yes McLean-Scocuzza, Nino Glow, MD  clopidogrel (PLAVIX) 75 MG tablet Take 75 mg by mouth daily.   Yes [provider]  colchicine 0.6 MG tablet TAKE 1 TABLET BY MOUTH ONCE DAILY Patient taking differently: Take 0.6 mg by mouth daily.  07/12/17  Yes Leone Haven, MD  diclofenac sodium (VOLTAREN) 1 % GEL Apply 2 g topically 4 (four) times daily as needed (for pain).    Yes [provider]  dicyclomine (BENTYL) 10 MG capsule Take 10 mg by mouth 3 (three) times daily before meals.    Yes [provider]  fluticasone (FLONASE) 50 MCG/ACT nasal spray Place 2 sprays into both nostrils daily. 05/10/19  Yes McLean-Scocuzza, Nino Glow, MD  furosemide (LASIX) 40 MG tablet Take 2 tablets (80 mg total) by mouth every other day. 09/25/19 09/24/20 Yes Patrecia Pour, MD  gabapentin (NEURONTIN) 300 MG capsule take 2 capsules (647m) by mouth  three times a day 10/02/19  Yes McLean-Scocuzza, TNino Glow MD  hydrALAZINE (APRESOLINE) 100 MG tablet TAKE 100 MG BY MOUTH THREE TIMES A DAY 01/02/19  Yes McLean-Scocuzza, TNino Glow MD  insulin lispro (HUMALOG) 100 UNIT/ML KiwkPen Inject 14-32 Units into the skin 3 (three) times daily. (according to sliding scale - max 96u over 24 hours)   Yes [provider]  isosorbide mononitrate (IMDUR) 120 MG 24 hr tablet TAKE 1 TABLET BY MOUTH EVERY DAY Patient taking differently: Take 120 mg by mouth daily.  10/07/17  Yes McLean-Scocuzza, TNino Glow MD  levocetirizine (XYZAL) 5 MG tablet Take 1 tablet (5 mg total) by mouth every evening. 02/09/18  Yes McLean-Scocuzza, TNino Glow MD  montelukast (SINGULAIR) 10 MG tablet Take 1 tablet (10 mg total) by mouth at bedtime. 07/03/19  Yes McLean-Scocuzza, TNino Glow MD  Multiple Vitamin (MULTI-VITAMINS) TABS Take 1 tablet by mouth daily. 04/15/09  Yes [provider]  nitroGLYCERIN (NITROSTAT) 0.4 MG SL tablet Place 1 tablet (0.4 mg total) under the tongue every 5 (five) minutes x 3 doses as needed. For chest pain.  Call 911 if no relief after 3 tablets 10/03/17 11/02/21 Yes McLean-Scocuzza, TNino Glow MD  omega-3 acid ethyl esters (LOVAZA) 1 g capsule Take 2 capsules (2 g total) by mouth 2 (two) times daily. 05/12/18  Yes McLean-Scocuzza, TNino Glow MD  omeprazole (PRILOSEC) 40 MG capsule Take 40 mg by mouth 2 (two) times daily.    Yes [provider]  pravastatin (PRAVACHOL) 40 MG tablet Take 1 tablet (40 mg total) by mouth daily. 03/28/19  Yes McLean-Scocuzza, TNino Glow MD  probenecid (BENEMID) 500 MG tablet Take 1,000 mg by mouth 2 (two) times daily. 09/03/19  Yes [provider]  sodium chloride HYPERTONIC 3 % nebulizer solution Take by nebulization 2 (two) times daily. 06/14/18  Yes McQuaid,  Ronie Spies, MD  spironolactone (ALDACTONE) 25 MG tablet Take 0.5 tablets (12.5 mg total) by mouth daily. 09/25/19 09/24/20 Yes Patrecia Pour, MD  TRESIBA FLEXTOUCH  200 UNIT/ML SOPN Inject 90 Units into the skin at bedtime. 04/25/18  Yes [provider]  vitamin C (ASCORBIC ACID) 500 MG tablet Take 500 mg by mouth daily.    Yes [provider]  Vitamin E 400 units TABS Take 400 Units by mouth 2 (two) times daily.   Yes [provider]   Allergies  Allergen Reactions  . Allopurinol Diarrhea, Other (See Comments) and Nausea And Vomiting    Other Reaction: GI Upset  . Atenolol Other (See Comments)    Other Reaction: bradycardia  . Atorvastatin Other (See Comments)    Other Reaction: muscle aches  . Ramipril Rash  . Rosuvastatin Other (See Comments)    Muscle aches  . Simvastatin Other (See Comments)    Other Reaction: MUSCLE ACHES (Zocor)  . Valsartan Other (See Comments)    Other Reaction: facial swelling  . Cardizem [Diltiazem] Rash    FAMILY HISTORY:  family history includes Arthritis in his mother; CVA in his father; Colon cancer in his sister; Diabetes in his brother; Lung cancer in his mother; Stomach cancer in his sister; Stroke in his father. SOCIAL HISTORY:  reports that he quit smoking about 57 years ago. His smoking use included cigarettes. He has a 3.00 pack-year smoking history. He quit smokeless tobacco use about 50 years ago.  His smokeless tobacco use included chew. He reports that he does not drink alcohol or use drugs.    REVIEW OF SYSTEMS  PATIENT IS UNABLE TO PROVIDE COMPLETE REVIEW OF SYSTEM S DUE TO SEVERE CRITICAL ILLNESS   VITAL SIGNS: Temp:  [98 F (36.7 C)-98.9 F (37.2 C)] 98.9 F (37.2 C) (01/29 0000) Pulse Rate:  [53-71] 61 (01/29 0700) Resp:  [23-33] 31 (01/29 0700) BP: (93-170)/(51-94) 149/91 (01/29 0700) SpO2:  [89 %-98 %] 89 % (01/29 0700) FiO2 (%):  [70 %-100 %] 90 % (01/29 0600)  PHYSICAL EXAMINATION:  GENERAL:critically ill appearing, +resp distress HEAD: Normocephalic, atraumatic.  EYES: Pupils equal, round, reactive to light.  No scleral icterus.  MOUTH: Moist mucosal  membrane. NECK: Supple. No thyromegaly. No nodules. No JVD.  PULMONARY: +rhonchi, +wheezing CARDIOVASCULAR: S1 and S2. Regular rate and rhythm. No murmurs, rubs, or gallops.  GASTROINTESTINAL: Soft, nontender, -distended. Positive bowel sounds.  MUSCULOSKELETAL: No swelling, clubbing, or edema.  NEUROLOGIC: alert and awake SKIN:intact,warm,dry    Recent Labs  Lab 10/10/19 0555 10/11/19 0045 10/12/19 0416  NA 133* 134* 137  K 3.6 3.6 3.8  CL 95* 95* 96*  CO2 29 24 29   BUN 13 22 22   CREATININE 0.99 1.18 0.94  GLUCOSE 293* 178* 145*   Recent Labs  Lab 10/10/19 0555 10/11/19 0045 10/12/19 0416  HGB 13.4 12.7* 13.1  HCT 37.7* 36.5* 39.8  WBC 10.6* 16.9* 15.0*  PLT 278 334 319   CT ANGIO CHEST PE W OR WO CONTRAST  Result Date: 10/11/2019 CLINICAL DATA:  Shortness of breath with sign 0 CIS EXAM: CT ANGIOGRAPHY CHEST WITH CONTRAST TECHNIQUE: Multidetector CT imaging of the chest was performed using the standard protocol during bolus administration of intravenous contrast. Multiplanar CT image reconstructions and MIPs were obtained to evaluate the vascular anatomy. CONTRAST:  51m OMNIPAQUE IOHEXOL 350 MG/ML SOLN COMPARISON:  Three days ago FINDINGS: Cardiovascular: Cardiomegaly. Extensive aortic and coronary atherosclerosis status post CABG. No pulmonary artery filling defect  or acute aortic finding. Mediastinum/Nodes: Negative for adenopathy or mass. No pneumomediastinum Lungs/Pleura: Patchy ground-glass opacity superimposed on fibrotic appearing coarse interstitial opacities with low lung volumes. This alveolitis appearance has progressed. No consolidation, effusion, or generalized septal thickening. Calcified granulomas in the left lower lobe. Upper Abdomen: Granulomatous calcifications. Cholecystectomy and right renal artery stenting. Musculoskeletal: No acute or aggressive finding. Review of the MIP images confirms the above findings. IMPRESSION: 1. Progressive pneumonitis compared  to 3 days ago. There also some coarse opacities with mild traction bronchiectasis suggesting superimposed fibrotic process. 2. Negative for pulmonary embolism. Electronically Signed   By: Monte Fantasia M.D.   On: 10/11/2019 05:13   DG Chest Port 1 View  Result Date: 10/10/2019 CLINICAL DATA:  Shortness of breath. Smoking history. Coronavirus infection. EXAM: PORTABLE CHEST 1 VIEW COMPARISON:  09/20/2019 FINDINGS: Radiographic worsening of widespread patchy bronchopneumonia throughout both lungs. No dense consolidation or lobar collapse. No visible effusion. Previous median sternotomy and CABG. Cardiomegaly. IMPRESSION: Radiographic worsening of widespread pulmonary infiltrates. Electronically Signed   By: Nelson Chimes M.D.   On: 10/10/2019 18:44   [place] EKG SITE RITE  Result Date: 10/11/2019 If Site Rite image not attached, placement could not be confirmed due to current cardiac rhythm.   ASSESSMENT / PLAN:  Acute on chronic hypoxic respiratory failure secondary to severe post COVID-19 pneumonitis/ fibrosis ? Superimposed Pneumonia Hx: Bronchiectasis, Pulmonary HTN, OSA Severe ACUTE Hypoxic and Hypercapnic Respiratory Failure Wean off biPAP BD therapy  Severe COVID-19 infection, ARDS and pneumonia/pneumonitis Continue IV steroids  Aggressive pulm toilet recommended Pulmonary hygiene Continue proning as tolerated due to severe hypoxia   Maintain airborne and contact precautions  As needed bronchodilators (MDI) Vitamin C and zinc Antitussives High risk for intubation and death Patient is DNR/DNI   ACUTE SYSTOLIC CARDIAC FAILURE- EF 45% -oxygen as needed -Lasix as tolerated   ELECTROLYTES -follow labs as needed -replace as needed -pharmacy consultation and following     DVT/GI PRX ordered TRANSFUSIONS AS NEEDED MONITOR FSBS ASSESS the need for LABS as needed     Corrin Parker, M.D.  [facility]  Medical Director [place] Director [place] Department

## 2019-10-13 ENCOUNTER — Inpatient Hospital Stay: Payer: Medicare Other

## 2019-10-13 LAB — GLUCOSE, CAPILLARY
Glucose-Capillary: 158 mg/dL — ABNORMAL HIGH (ref 70–99)
Glucose-Capillary: 260 mg/dL — ABNORMAL HIGH (ref 70–99)
Glucose-Capillary: 125 mg/dL — ABNORMAL HIGH (ref 70–99)
Glucose-Capillary: 151 mg/dL — ABNORMAL HIGH (ref 70–99)

## 2019-10-13 LAB — CBC WITH DIFFERENTIAL/PLATELET
Abs Immature Granulocytes: 0.1 10*3/uL — ABNORMAL HIGH (ref 0.00–0.07)
Basophils Absolute: 0 10*3/uL (ref 0.0–0.1)
Basophils Relative: 0 %
Eosinophils Absolute: 0 10*3/uL (ref 0.0–0.5)
Eosinophils Relative: 0 %
HCT: 35 % — ABNORMAL LOW (ref 39.0–52.0)
Hemoglobin: 11.7 g/dL — ABNORMAL LOW (ref 13.0–17.0)
Immature Granulocytes: 1 %
Lymphocytes Relative: 8 %
Lymphs Abs: 0.9 10*3/uL (ref 0.7–4.0)
MCH: 34.2 pg — ABNORMAL HIGH (ref 26.0–34.0)
MCHC: 33.4 g/dL (ref 30.0–36.0)
MCV: 102.3 fL — ABNORMAL HIGH (ref 80.0–100.0)
Monocytes Absolute: 0.8 10*3/uL (ref 0.1–1.0)
Monocytes Relative: 7 %
Neutro Abs: 10.1 10*3/uL — ABNORMAL HIGH (ref 1.7–7.7)
Neutrophils Relative %: 84 %
Platelets: 306 10*3/uL (ref 150–400)
RBC: 3.42 MIL/uL — ABNORMAL LOW (ref 4.22–5.81)
RDW: 14.6 % (ref 11.5–15.5)
WBC: 11.9 10*3/uL — ABNORMAL HIGH (ref 4.0–10.5)
nRBC: 0 % (ref 0.0–0.2)

## 2019-10-13 LAB — CULTURE, BLOOD (ROUTINE X 2)
Culture: NO GROWTH
Culture: NO GROWTH
Special Requests: ADEQUATE
Special Requests: ADEQUATE

## 2019-10-13 LAB — PHOSPHORUS: Phosphorus: 2.2 mg/dL — ABNORMAL LOW (ref 2.5–4.6)

## 2019-10-13 LAB — BASIC METABOLIC PANEL
Anion gap: 11 (ref 5–15)
BUN: 24 mg/dL — ABNORMAL HIGH (ref 8–23)
CO2: 28 mmol/L (ref 22–32)
Calcium: 8.6 mg/dL — ABNORMAL LOW (ref 8.9–10.3)
Chloride: 98 mmol/L (ref 98–111)
Creatinine, Ser: 0.91 mg/dL (ref 0.61–1.24)
GFR calc Af Amer: >60 mL/min (ref >60)
GFR calc non Af Amer: >60 mL/min (ref >60)
Glucose, Bld: 158 mg/dL — ABNORMAL HIGH (ref 70–99)
Potassium: 4.5 mmol/L (ref 3.5–5.1)
Sodium: 137 mmol/L (ref 135–145)

## 2019-10-13 LAB — MAGNESIUM: Magnesium: 2.2 mg/dL (ref 1.7–2.4)

## 2019-10-13 MED ORDER — MORPHINE SULFATE (PF) 4 MG/ML IV SOLN
INTRAVENOUS | Status: AC
  Filled 2019-10-13: qty 1

## 2019-10-13 MED ORDER — FENTANYL CITRATE (PF) 100 MCG/2ML IJ SOLN
INTRAMUSCULAR | Status: AC
  Filled 2019-10-13: qty 2

## 2019-10-13 MED ORDER — MORPHINE SULFATE (PF) 2 MG/ML IV SOLN: 2.0000 mg | INTRAVENOUS | Status: DC

## 2019-10-13 MED ORDER — DEXTROSE 50 % IV SOLN: 12.5000 g | INTRAVENOUS | Status: DC

## 2019-10-13 MED ORDER — DEXMEDETOMIDINE HCL IN NACL 400 MCG/100ML IV SOLN
0.4000 ug/kg/h | INTRAVENOUS | Status: DC
  Administered 2019-10-13: 0.4 ug/kg/h via INTRAVENOUS
  Administered 2019-10-14: 0.7 ug/kg/h via INTRAVENOUS
  Filled 2019-10-13 (×3): qty 100

## 2019-10-13 MED ORDER — HALOPERIDOL LACTATE 5 MG/ML IJ SOLN: 2.0000 mg | Freq: Once | INTRAMUSCULAR | Status: DC

## 2019-10-13 MED ORDER — FENTANYL CITRATE (PF) 100 MCG/2ML IJ SOLN
12.5000 ug | Freq: Once | INTRAMUSCULAR | Status: AC
  Administered 2019-10-13: 12.5 ug via INTRAVENOUS

## 2019-10-13 NOTE — Consult Note (Signed)
PHARMACY CONSULT NOTE - FOLLOW UP  Pharmacy Consult for Electrolyte Monitoring and Replacement   Montre C Tiptonis a1 y.o.maleadmittedon 01/19/2021with acute hypoxic respiratory failure.PMH includes CHF, CAD, Crohn's disease, diabetes, GERD, T2DM, MI, bronchiectasis, OSA not on CPAP, HTN, HLD, Afib, COVID-19 (08/2019). Pharmacy consulted for monitoring and replacing electrolytes.  Recent Labs: Potassium (mmol/L)  Date Value  10/13/2019 4.5   Magnesium (mg/dL)  Date Value  10/13/2019 2.2   Calcium (mg/dL)  Date Value  10/13/2019 8.6 (L)   Albumin (g/dL)  Date Value  09/19/2019 3.0 (L)   Phosphorus (mg/dL)  Date Value  10/13/2019 2.2 (L)   Sodium (mmol/L)  Date Value  10/13/2019 137     Assessment: 1. Electrolytes: Phos 2.2.  *Patient has not received the KPhos po that was ordered yesterday 1/29 yet.( potassium phosphate 500 mg PO TID x 3 doses).   Will continue to monitor electrolytes in am labs. Will replace electrolytes to achieve potassium ~4, magnesium ~2, and sodium ~140.   2. Glucose:  Today's range: 137-172. Glucose-capillary monitored Q4H. Pt is on insulin glargine 60 units [place] QHS, insulin aspart 0-20 units [place] TID WM, and insulin aspart 6 units [place] TID WM. Pt is also on Solu-Medrol 68m IV Q8H. Continue insulin administrations and monitor glucose levels daily.   3. Constipation: Last BM: 01/27. Pt is on docusate sodium 1015mPO BID PRN and miralax PO BID PRN. Continue to monitor.    KrChinita GreenlandharmD Clinical Pharmacist 10/13/2019

## 2019-10-13 NOTE — Progress Notes (Addendum)
19:52 - Patient pulled off High Flow Nasal Cannula and began to desaturate into the 60s. Patient experiencing increased work of breathing and therefor placed back on Bipap. Patient tolerating Bipap well at this time. SATs have improved into the 80s and continue to slowly climb. Will continue to monitor. RN aware.  20:17 - Patient became more agitated stating he was nauseous, pulling at Bipap mask. Patient taken off of Bipap and placed on HHFNC and NonRebreather Mask. RN and NP at bedside.

## 2019-10-13 NOTE — Consult Note (Signed)
Name: BURTON GAHAN MRN: [id number] DOB: 10-30-1945    ADMISSION DATE:  09/21/2019 CONSULTATION DATE:  10/11/2019  REFERRING MD :  Sharion Settler, NP  CHIEF COMPLAINT:  Acute Respiratory Distress, Hypoxia  BRIEF PATIENT DESCRIPTION:  74 year old male admitted 10/02/2019 with acute on chronic hypoxic respiratory failure secondary to post COVID-19 pneumonitis/fibrosis.  On 1/28 he was noted to have acute respiratory distress and severe hypoxia requiring transfer to stepdown for BiPAP.  CTA chest 1/28 negative for PE, however with progressive pneumonitis/fibrosis. Questionable superimposed pneumonia. High risk for intubation.  SIGNIFICANT EVENTS  1/25-admission to [place] 1/26- PCCM consulted for worsening hypoxia 1/27- Transferred to [place] 1/28- Acute Respiratory Distress & Hypoxia; transfer back to Stepdown for BiPAP  STUDIES:  1/25- CT Head w/o Contrast>>1. Stable age related cerebral atrophy, ventriculomegaly and periventricular white matter disease. 2. No acute intracranial findings or mass lesions. 1/25- CTA Chest>> 1. No evidence of acute pulmonary embolism or other acute vascular process. 2. Chronic lung disease with progressive subpleural reticulation and patchy ground-glass opacities compared with previous CT of 06/13/2018. These findings likely relate to the patient's recent COVID-19 infection (residual inflammation versus postinflammatory scarring). 3. Sequela of prior granulomatous disease. 4. Stable central enlargement of the pulmonary arteries consistent with pulmonary arterial hypertension. Coronary and Aortic Atherosclerosis 1/28- CTA Chest>> 1. Progressive pneumonitis compared to 3 days ago. There also some coarse opacities with mild traction bronchiectasis suggesting superimposed fibrotic process. 2. Negative for pulmonary embolism.  CULTURES: Blood cultures 1/25>> no growth to date Urine 1/25>> Enterococcus faecalis Blood cultures 1/28>> Strep pneumo  urinary antigen 1/28>> Legionella urinary antigen 1/28>>  ANTIBIOTICS: Vancomycin 1/25>> 1/25 Cefepime 1/28>> Vancomycin 1/28>>  CC Follow up resp failure  HPI Remains on biPAP Patient is DNR/DNI resp distress    Review of Systems:  Gen:  Denies  fever, sweats, chills weight loss  HEENT: Denies blurred vision, double vision, ear pain, eye pain, hearing loss, nose bleeds, sore throat Cardiac:  No dizziness, chest pain or heaviness, chest tightness,edema, No JVD Resp: +SOB  Other:  All other systems negative    VITAL SIGNS: Temp:  [98.2 F (36.8 C)-98.5 F (36.9 C)] 98.2 F (36.8 C) (01/30 0336) Pulse Rate:  [55-81] 58 (01/30 0600) Resp:  [18-44] 22 (01/30 0600) BP: (92-169)/(59-110) 145/70 (01/30 0600) SpO2:  [82 %-98 %] 93 % (01/30 0600) FiO2 (%):  [85 %-100 %] 100 % (01/30 0430)  PHYSICAL EXAMINATION:  GENERAL:critically ill appearing, +resp distress HEAD: Normocephalic, atraumatic.  EYES: Pupils equal, round, reactive to light.  No scleral icterus.  MOUTH: Moist mucosal membrane. NECK: Supple. No thyromegaly. No nodules. No JVD.  PULMONARY: +rhonchi, +wheezing CARDIOVASCULAR: S1 and S2. Regular rate and rhythm. No murmurs, rubs, or gallops.  GASTROINTESTINAL: Soft, nontender, -distended. Positive bowel sounds.  MUSCULOSKELETAL: No swelling, clubbing, or edema.  NEUROLOGIC: alert and awake SKIN:intact,warm,dry     Recent Labs  Lab 10/11/19 0045 10/12/19 0416 10/13/19 0439  NA 134* 137 137  K 3.6 3.8 4.5  CL 95* 96* 98  CO2 24 29 28   BUN 22 22 24*  CREATININE 1.18 0.94 0.91  GLUCOSE 178* 145* 158*   Recent Labs  Lab 10/11/19 0045 10/12/19 0416 10/13/19 0439  HGB 12.7* 13.1 11.7*  HCT 36.5* 39.8 35.0*  WBC 16.9* 15.0* 11.9*  PLT 334 319 306   No results found.  ASSESSMENT / PLAN:  Severe COVID-19 infection, ARDS and pneumonia/pneumonitis Continue IV steroids  Aggressive pulm toilet recommended OOB to chair as tolerated  Pulmonary  hygiene Continue proning as tolerated due to severe hypoxia   Maintain airborne and contact precautions  As needed bronchodilators (MDI) Vitamin C and zinc Antitussives High risk for intubation and death  Severe ACUTE Hypoxic and Hypercapnic Respiratory Failure Wean off biPAP as tolerated PATIENT IS DNR/DNI   ACUTE SYSTOLIC CARDIAC FAILURE- EF 45% -oxygen as needed -Lasix as tolerated   ELECTROLYTES -follow labs as needed -replace as needed -pharmacy consultation and following    DVT/GI PRX ordered TRANSFUSIONS AS NEEDED MONITOR FSBS ASSESS the need for LABS as needed    REMAINS SD STATUS  Corrin Parker, M.D.  [facility]  Medical Director [place] Director [facility] Cardio-Pulmonary Department

## 2019-10-13 NOTE — Progress Notes (Signed)
Chase Cabrera NAME: Chase Cabrera    MR#:  [id number]  DATE OF BIRTH:  [birth year]  SUBJECTIVE:  patient now on HFNC 50 L/min 100% Fio2. Sats 84-90% desats easily Son at bedside  REVIEW OF SYSTEMS:   Review of Systems  Constitutional: Positive for malaise/fatigue. Negative for chills, fever and weight loss.  HENT: Negative for ear discharge, ear pain and nosebleeds.   Eyes: Negative for blurred vision, pain and discharge.  Respiratory: Positive for shortness of breath. Negative for sputum production, wheezing and stridor.   Cardiovascular: Negative for chest pain, palpitations, orthopnea and PND.  Gastrointestinal: Negative for abdominal pain, diarrhea, nausea and vomiting.  Genitourinary: Negative for frequency and urgency.  Musculoskeletal: Negative for back pain and joint pain.  Neurological: Positive for weakness. Negative for sensory change, speech change and focal weakness.  Psychiatric/Behavioral: Negative for depression and hallucinations. The patient is not nervous/anxious.    Tolerating Diet: yes --some Tolerating PT: SNF  DRUG ALLERGIES:   Allergies  Allergen Reactions  . Allopurinol Diarrhea, Other (See Comments) and Nausea And Vomiting    Other Reaction: GI Upset  . Atenolol Other (See Comments)    Other Reaction: bradycardia  . Atorvastatin Other (See Comments)    Other Reaction: muscle aches  . Ramipril Rash  . Rosuvastatin Other (See Comments)    Muscle aches  . Simvastatin Other (See Comments)    Other Reaction: MUSCLE ACHES (Zocor)  . Valsartan Other (See Comments)    Other Reaction: facial swelling  . Cardizem [Diltiazem] Rash    VITALS:  Blood pressure 136/71, pulse 80, temperature 98 F (36.7 C), temperature source Axillary, resp. rate (!) 25, height 5' 8"  (1.727 m), weight 79.4 kg, SpO2 (!) 89 %.  PHYSICAL EXAMINATION:   Physical Exam  GENERAL:  74 y.o.-year-old patient lying in the bed  with mild acute resp distress.  EYES: Pupils equal, round, reactive to light and accommodation. HEENT: Head atraumatic, normocephalic. Oropharynx and nasopharynx clear. HFNC+  LUNGS: diminished breath sounds bilaterally, no wheezing, rales,occ rhonchi. No use of accessory muscles of respiration.  CARDIOVASCULAR: S1, S2 normal. No murmurs, rubs, or gallops.  ABDOMEN: Soft, nontender, nondistended. Bowel sounds present. No organomegaly or mass.  EXTREMITIES: No cyanosis, clubbing o -+edema b/l.    NEUROLOGIC:  Grossly No focal Motor or sensory deficits b/l.  deconditioned PSYCHIATRIC:  patient is alert and awake  SKIN: No obvious rash, lesion, or ulcer.   LABORATORY PANEL:  CBC Recent Labs  Lab 10/13/19 0439  WBC 11.9*  HGB 11.7*  HCT 35.0*  PLT 306    Chemistries  Recent Labs  Lab 09/26/2019 1452 10/09/19 0051 10/13/19 0439  NA 131*   < > 137  K 3.3*   < > 4.5  CL 93*   < > 98  CO2 28   < > 28  GLUCOSE 314*   < > 158*  BUN 13   < > 24*  CREATININE 1.05   < > 0.91  CALCIUM 8.6*   < > 8.6*  MG  --    < > 2.2  AST 56*  --   --   ALT 25  --   --   ALKPHOS 51  --   --   BILITOT 1.3*  --   --    < > = values in this interval not displayed.   Cardiac Enzymes No results for input(s): TROPONINI in the last 168 hours. RADIOLOGY:  No results found. ASSESSMENT AND PLAN:  Chase Cabrera Tiptonis a 74 y.o.Caucasianmalewith medical history significant ofbronchiectasis, OSA not on CPAP, DM2 on insulin, MI, CAD status post multiple stents, HTN, gout who presented with worsening shortness of breath.    # Acute hypoxic respiratory failure # Recent COVID-19 infection # Hx ofBronchiectasis --On presentation, had fever and needed 6L O2 to maintain sats in low 90%. --Dxof Covid on 09/04/19 and was discharged on 09/25/2019 after receiving treatment with remdesivir,Actemra, and Decadron which he completed on 10/06/19 and3 L O2. Was doing well until day of presentation. -Superimposed  PNA is less likely since procal is neg, and CT chest didn't show acute findings or consolidations but has worsening chronic lung disease findings/fibrosis/scarring --continue suppl O2 to maintain sats >90% wean down to keep sats >88%. Currently on HFNC with prn BIPAP --cont  IV solu-medrol 40 mg q8h  --recieved 100 mg lasix last pm with good uop --Pulm consult with dr Mortimer Fries appreciated. Pt needs to be in ICU for now. -On po Augmentin day 3  # Hx ofBronchiectasis  # Hx of OSA --Pt has declined CPAP in the past  # DM2 on insulin # Neuropathy --continue homeLantusas 60unightly (90u at home) --SSI and Aspart TID --continue home gabapentin  # Hx of mild systolic CHF # Hx of pulm HTN --Last echo on 09/16/18 showed LVEF 45-50% --cont IV Lasix prn as BP allows.  # Hx of MI's and CAD s/p stents --continue home ASA, plavix and statin  # HTN --Hold home BP meds due to BP low on presentation. Resume as BP allows.  # Hx of gout --continue home colchicine and Probenecid  # GERD --continue home PPI  PT to see once stable from respiratory wise  Palliative care to met with pt and son and pt is now DNR. He is in agreement with comfort care if his condition declines  DVT prophylaxis: Lovenox SQ Code Status: DNR (10/11/2019--d/w with palliative care) Family Communication: son at bedside Disposition Plan:  rehab, ?LTAC Barriers to d/c: transferred to ICU given worsening respiratory status  TOTAL TIME TAKING CARE OF THIS PATIENT: *25* minutes.  >50% time spent on counselling and coordination of care  Note: This dictation was prepared with Dragon dictation along with smaller phrase technology. Any transcriptional errors that result from this process are unintentional.  Fritzi Mandes M.D    [facility]   CC: Primary care physician; McLean-Scocuzza, Nino Glow, MDPatient ID: Chase Cabrera, male   DOB: [birth year], 74 y.o.   MRN: [id number]

## 2019-10-13 NOTE — Progress Notes (Signed)
Pharmacy Antibiotic Note  Chase Cabrera is a 74 y.o. male admitted on 09/29/2019 with acute hypoxic respiratory failure. PMH includes CHF, CAD, Crohn's disease, diabetes, GERD, T2DM, MI, bronchiectasis, OSA not on CPAP, HTN, HLD, Afib, COVID-19 (08/2019). BCx collected on 01/25 have not shown any growth. UCx from 01/25 showed growth of E. faecalis. Procalcitonin today was 0/29. WBCs are lower than yesterday and pt is afebrile. Per am labs yesterday, discussions were made to switch to amoxicillin to treat UTI. Pharmacy has been consulted for amoxicillin dosing.  Plan: Initiate amoxicillin 516m PO Q8H for 5 days for UTI with growth of E. faecalis. End of therapy: 02/02   Will continue to monitor and de-escalate therapy according to clinical presentation and cultures.   Antibiotic day so far: 3 out of 5  Height: 5' 8"  (172.7 cm) Weight: 175 lb 0.7 oz (79.4 kg) IBW/kg (Calculated) : 68.4  Temp (24hrs), Avg:98.3 F (36.8 C), Min:98.2 F (36.8 C), Max:98.5 F (36.9 C)  Recent Labs  Lab 10/05/2019 1452 10/13/2019 1600 10/09/19 0039 10/09/19 0051 10/10/19 0555 10/11/19 0045 10/11/19 0531 10/11/19 0844 10/12/19 0416 10/13/19 0439  WBC   < >  --   --  11.8* 10.6* 16.9*  --   --  15.0* 11.9*  CREATININE   < >  --   --  1.10 0.99 1.18  --   --  0.94 0.91  LATICACIDVEN  --  1.9 1.5  --   --   --  1.7 1.3  --   --    < > = values in this interval not displayed.    Estimated Creatinine Clearance: 67.9 mL/min (by C-G formula based on SCr of 0.91 mg/dL).    Allergies  Allergen Reactions  . Allopurinol Diarrhea, Other (See Comments) and Nausea And Vomiting    Other Reaction: GI Upset  . Atenolol Other (See Comments)    Other Reaction: bradycardia  . Atorvastatin Other (See Comments)    Other Reaction: muscle aches  . Ramipril Rash  . Rosuvastatin Other (See Comments)    Muscle aches  . Simvastatin Other (See Comments)    Other Reaction: MUSCLE ACHES (Zocor)  . Valsartan Other (See  Comments)    Other Reaction: facial swelling  . Cardizem [Diltiazem] Rash    Antimicrobials this admission: Vancomycin 1g IV 01/25 >> 01/25 Cefepime IV 01/25 >> 01/25 Vancomycin IV 01/28 >> 01/28 Cefepime IV 01/28 >> 01/28 Amoxicillin PO 01/28 >> 02/02   Dose adjustments this admission: No dose adjustment needed at this time.   Microbiology results: 01/28 BCx: No growth for 1 day 01/28 BCx: No growth 1 day 01/25 BCx: No growth for 4 days 01/25 BCx: No growth for 4 days 01/25 UCx: >/= 100,000 colonies/mL, E. faecalis     Thank you for allowing pharmacy to be a part of this patient's care.  KChinita GreenlandPharmD Clinical Pharmacist 10/13/2019

## 2019-10-14 DIAGNOSIS — Z515 Encounter for palliative care: Secondary | ICD-10-CM

## 2019-10-14 LAB — BASIC METABOLIC PANEL
Anion gap: 8 (ref 5–15)
BUN: 26 mg/dL — ABNORMAL HIGH (ref 8–23)
CO2: 28 mmol/L (ref 22–32)
Calcium: 8.9 mg/dL (ref 8.9–10.3)
Chloride: 98 mmol/L (ref 98–111)
Creatinine, Ser: 0.9 mg/dL (ref 0.61–1.24)
GFR calc Af Amer: >60 mL/min (ref >60)
GFR calc non Af Amer: >60 mL/min (ref >60)
Glucose, Bld: 210 mg/dL — ABNORMAL HIGH (ref 70–99)
Potassium: 5.2 mmol/L — ABNORMAL HIGH (ref 3.5–5.1)
Sodium: 134 mmol/L — ABNORMAL LOW (ref 135–145)

## 2019-10-14 LAB — CBC WITH DIFFERENTIAL/PLATELET
Abs Immature Granulocytes: 0.09 10*3/uL — ABNORMAL HIGH (ref 0.00–0.07)
Basophils Absolute: 0 10*3/uL (ref 0.0–0.1)
Basophils Relative: 0 %
Eosinophils Absolute: 0 10*3/uL (ref 0.0–0.5)
Eosinophils Relative: 0 %
HCT: 36.2 % — ABNORMAL LOW (ref 39.0–52.0)
Hemoglobin: 12.1 g/dL — ABNORMAL LOW (ref 13.0–17.0)
Immature Granulocytes: 1 %
Lymphocytes Relative: 7 %
Lymphs Abs: 0.9 10*3/uL (ref 0.7–4.0)
MCH: 34.2 pg — ABNORMAL HIGH (ref 26.0–34.0)
MCHC: 33.4 g/dL (ref 30.0–36.0)
MCV: 102.3 fL — ABNORMAL HIGH (ref 80.0–100.0)
Monocytes Absolute: 1 10*3/uL (ref 0.1–1.0)
Monocytes Relative: 8 %
Neutro Abs: 11.2 10*3/uL — ABNORMAL HIGH (ref 1.7–7.7)
Neutrophils Relative %: 84 %
Platelets: 355 10*3/uL (ref 150–400)
RBC: 3.54 MIL/uL — ABNORMAL LOW (ref 4.22–5.81)
RDW: 14.6 % (ref 11.5–15.5)
WBC: 13.2 10*3/uL — ABNORMAL HIGH (ref 4.0–10.5)
nRBC: 0 % (ref 0.0–0.2)

## 2019-10-14 LAB — PHOSPHORUS: Phosphorus: 2.6 mg/dL (ref 2.5–4.6)

## 2019-10-14 LAB — LEGIONELLA PNEUMOPHILA SEROGP 1 UR AG: L. pneumophila Serogp 1 Ur Ag: NEGATIVE

## 2019-10-14 LAB — GLUCOSE, CAPILLARY: Glucose-Capillary: 167 mg/dL — ABNORMAL HIGH (ref 70–99)

## 2019-10-14 MED ORDER — FENTANYL 2500MCG IN NS 250ML (10MCG/ML) PREMIX INFUSION
0.0000 ug/h | INTRAVENOUS | Status: DC
  Administered 2019-10-14: 50 ug/h via INTRAVENOUS
  Filled 2019-10-14: qty 250

## 2019-10-14 MED ORDER — FENTANYL CITRATE (PF) 100 MCG/2ML IJ SOLN: 50.0000 ug | INTRAMUSCULAR | Status: DC | PRN

## 2019-10-14 MED ORDER — MIDAZOLAM HCL 2 MG/2ML IJ SOLN: 2.0000 mg | INTRAMUSCULAR | Status: DC | PRN

## 2019-10-14 MED ORDER — GLYCOPYRROLATE 1 MG PO TABS
1.0000 mg | ORAL_TABLET | ORAL | Status: DC | PRN
  Filled 2019-10-14: qty 1

## 2019-10-14 MED ORDER — DEXTROSE 5 % IV SOLN: INTRAVENOUS | Status: DC

## 2019-10-14 MED ORDER — ACETAMINOPHEN 325 MG PO TABS: 650.0000 mg | ORAL_TABLET | Freq: Four times a day (QID) | ORAL | Status: DC | PRN

## 2019-10-14 MED ORDER — MIDAZOLAM HCL 2 MG/2ML IJ SOLN: 2.0000 mg | Freq: Once | INTRAMUSCULAR | Status: DC

## 2019-10-14 MED ORDER — FENTANYL BOLUS VIA INFUSION
100.0000 ug | INTRAVENOUS | Status: DC | PRN
  Filled 2019-10-14: qty 100

## 2019-10-14 MED ORDER — POLYVINYL ALCOHOL 1.4 % OP SOLN
1.0000 [drp] | Freq: Four times a day (QID) | OPHTHALMIC | Status: DC | PRN
  Filled 2019-10-14: qty 15

## 2019-10-14 MED ORDER — DIPHENHYDRAMINE HCL 50 MG/ML IJ SOLN: 25.0000 mg | INTRAMUSCULAR | Status: DC | PRN

## 2019-10-14 MED ORDER — GLYCOPYRROLATE 0.2 MG/ML IJ SOLN: 0.2000 mg | INTRAMUSCULAR | Status: DC | PRN

## 2019-10-14 MED ORDER — MIDAZOLAM HCL 2 MG/2ML IJ SOLN
2.0000 mg | INTRAMUSCULAR | Status: DC | PRN
  Administered 2019-10-14: 4 mg via INTRAVENOUS
  Filled 2019-10-14: qty 4

## 2019-10-14 MED ORDER — ACETAMINOPHEN 650 MG RE SUPP: 650.0000 mg | Freq: Four times a day (QID) | RECTAL | Status: DC | PRN

## 2019-10-15 NOTE — Progress Notes (Signed)
CRITICAL CARE NOTE  74 year old male admitted 10/06/2019 with acute on chronic hypoxic respiratory failure secondary to post COVID-19 pneumonitis/fibrosis.  On 1/28 he was noted to have acute respiratory distress and severe hypoxia requiring transfer to stepdown for BiPAP.  CTA chest 1/28 negative for PE, however with progressive pneumonitis/fibrosis. Questionable superimposed pneumonia. High risk for intubation.  SIGNIFICANT EVENTS  1/25-admission to [facility] 1/26- PCCM consulted for worsening hypoxia 1/27- Transferred to [facility] 1/28- Acute Respiratory Distress & Hypoxia; transfer back to Stepdown for BiPAP 1/31 severe resp distress, suffering on biPAP  STUDIES:  1/25- CT Head w/o Contrast>>1. Stable age related cerebral atrophy, ventriculomegaly and periventricular white matter disease. 2. No acute intracranial findings or mass lesions. 1/25- CTA Chest>> 1. No evidence of acute pulmonary embolism or other acute vascular process. 2. Chronic lung disease with progressive subpleural reticulation and patchy ground-glass opacities compared with previous CT of 06/13/2018. These findings likely relate to the patient's recent COVID-19 infection (residual inflammation versus postinflammatory scarring). 3. Sequela of prior granulomatous disease. 4. Stable central enlargement of the pulmonary arteries consistent with pulmonary arterial hypertension. Coronary and Aortic Atherosclerosis 1/28- CTA Chest>> 1. Progressive pneumonitis compared to 3 days ago. There also some coarse opacities with mild traction bronchiectasis suggesting superimposed fibrotic process. 2. Negative for pulmonary embolism.  CULTURES: Blood cultures 1/25>> no growth to date Urine 1/25>> Enterococcus faecalis Blood cultures 1/28>> Strep pneumo urinary antigen 1/28>> Legionella urinary antigen 1/28>>  ANTIBIOTICS: Vancomycin 1/25>> 1/25 Cefepime 1/28>> Vancomycin 1/28>>    CC  follow up respiratory  failure  SUBJECTIVE Patient remains critically ill Prognosis is guarded Patient on biPAP Suffering and suffocating recommend comfort care measures   BP (!) 159/73   Pulse (!) 49   Temp 98.3 F (36.8 C) (Axillary)   Resp (!) 30   Ht 5' 8"  (1.727 m)   Wt 79.4 kg   SpO2 97%   BMI 26.62 kg/m    I/O last 3 completed shifts: In: 529.8 [P.O.:390; I.V.:139.8] Out: 550 [Urine:550] No intake/output data recorded.  SpO2: 97 % O2 Flow Rate (L/min): 55 L/min FiO2 (%): 90 %    REVIEW OF SYSTEMS  PATIENT IS UNABLE TO PROVIDE COMPLETE REVIEW OF SYSTEMS DUE TO SEVERE CRITICAL ILLNESS   PHYSICAL EXAMINATION:  GENERAL:critically ill appearing, +resp distress HEAD: Normocephalic, atraumatic.  EYES: Pupils equal, round, reactive to light.  No scleral icterus.  MOUTH: Moist mucosal membrane. NECK: Supple.  PULMONARY: +rhonchi, +wheezing CARDIOVASCULAR: S1 and S2. Regular rate and rhythm. No murmurs, rubs, or gallops.  GASTROINTESTINAL: Soft, nontender, -distended.  Positive bowel sounds.   MUSCULOSKELETAL: No swelling, clubbing, or edema.  NEUROLOGIC: obtunded, GCS<8 SKIN:intact,warm,dry  MEDICATIONS: I have reviewed all medications and confirmed regimen as documented   CULTURE RESULTS   Recent Results (from the past 240 hour(s))  Blood culture (routine x 2)     Status: None   Collection Time: 09/17/2019  2:59 PM   Specimen: BLOOD  Result Value Ref Range Status   Specimen Description BLOOD RIGHT ANTECUBITAL  Final   Special Requests   Final    BOTTLES DRAWN AEROBIC AND ANAEROBIC Blood Culture adequate volume   Culture   Final    NO GROWTH 5 DAYS Performed at [facility], [address], [place], [place] [address]    Report Status 10/13/2019 FINAL  Final  Urine culture     Status: Abnormal   Collection Time: 09/16/2019  3:24 PM   Specimen: Urine, Random  Result Value Ref Range Status  Specimen Description   Final    URINE, RANDOM Performed at [facility], [address] [place], [place] [address]    Special Requests   Final    NONE Performed at [facility], [address] [place], [place] [address]    Culture >=100,000 COLONIES/mL ENTEROCOCCUS FAECALIS (A)  Final   Report Status 10/11/2019 FINAL  Final   Organism ID, Bacteria ENTEROCOCCUS FAECALIS (A)  Final      Susceptibility   Enterococcus faecalis - MIC*    AMPICILLIN <=2 SENSITIVE Sensitive     NITROFURANTOIN <=16 SENSITIVE Sensitive     VANCOMYCIN 1 SENSITIVE Sensitive     * >=100,000 COLONIES/mL ENTEROCOCCUS FAECALIS  Blood culture (routine x 2)     Status: None   Collection Time: 09/17/2019  3:53 PM   Specimen: BLOOD  Result Value Ref Range Status   Specimen Description BLOOD BLOOD LEFT HAND  Final   Special Requests   Final    BOTTLES DRAWN AEROBIC AND ANAEROBIC Blood Culture adequate volume   Culture   Final    NO GROWTH 5 DAYS Performed at [facility], [address] [place], [place] [address]    Report Status 10/13/2019 FINAL  Final  CULTURE, BLOOD (ROUTINE X 2) w Reflex to ID Panel     Status: None (Preliminary result)   Collection Time: 10/11/19  3:24 AM   Specimen: BLOOD  Result Value Ref Range Status   Specimen Description BLOOD BLOOD RIGHT HAND  Final   Special Requests   Final    BOTTLES DRAWN AEROBIC AND ANAEROBIC Blood Culture adequate volume   Culture   Final    NO GROWTH 3 DAYS Performed at [facility], [address] [place], [place] [address]    Report Status PENDING  Incomplete  CULTURE, BLOOD (ROUTINE X 2) w Reflex to ID Panel     Status: None (Preliminary result)   Collection Time: 10/11/19  3:24 AM   Specimen: BLOOD  Result Value Ref Range Status   Specimen Description BLOOD BLOOD RIGHT HAND  Final   Special Requests   Final    BOTTLES DRAWN AEROBIC AND ANAEROBIC Blood Culture adequate volume   Culture   Final    NO GROWTH 3 DAYS Performed at [facility], [address]  [place], [place] [address]    Report Status PENDING  Incomplete          IMAGING    DG Chest Port 1 View  Result Date: 10/13/2019 CLINICAL DATA:  Acute on chronic respiratory failure. EXAM: PORTABLE CHEST 1 VIEW COMPARISON:  10/10/2019 FINDINGS: There are worsening bilateral airspace opacities most evident in the right lower lung zone. No pneumothorax. There may be small bilateral pleural effusions. There is no pneumothorax. The heart size remains enlarged. The patient is status post prior median sternotomy. IMPRESSION: Interval worsening of bilateral airspace opacities, most evident in the right lower lung zone. Electronically Signed   By: Constance Holster M.D.   On: 10/13/2019 20:25       ASSESSMENT AND PLAN SYNOPSIS Severe COVID-19 infection, ARDS and pneumonia/pneumonitis Continue IV steroids  Aggressive pulm toilet recommended Pulmonary hygiene Continue proning as tolerated due to severe hypoxia   Maintain airborne and contact precautions  As needed bronchodilators (MDI) Vitamin C and zinc Antitussives High risk for intubation and death    Severe ACUTE Hypoxic and Hypercapnic Respiratory Failure biPAP as needed   NEUROLOGY Pain and suffering Agitated Fentanyl as needed  CARDIAC ICU monitoring  ID -continue IV abx as prescibed -follow up cultures  GI GI PROPHYLAXIS as indicated  NUTRITIONAL STATUS DIET-->NPO Constipation protocol as indicated  ENDO - will use ICU hypoglycemic\Hyperglycemia protocol if indicated   ELECTROLYTES -follow labs as needed -replace as needed -pharmacy consultation and following   DVT/GI PRX ordered TRANSFUSIONS AS NEEDED MONITOR FSBS ASSESS the need for LABS as needed   Critical Care Time devoted to patient care services described in this note is 32 minutes.   Overall, patient is critically ill, prognosis is guarded.  Patient with Multiorgan failure and at high risk for cardiac arrest and death.    RECOMMEND  COMFORT CARE MEASURES  Meaghen Vecchiarelli Patricia Pesa, M.D.  [facility]  Medical Director [place] Director [facility] Cardio-Pulmonary Department

## 2019-10-15 NOTE — Death Summary Note (Signed)
DEATH SUMMARY   Patient Details  Name: Chase Cabrera MRN: [id number] DOB: 1946-05-28  Admission/Discharge Information   Admit Date:  11-01-19  Date of Death:   11/07/2019   Time of Death:  1PM  Length of Stay: 6  Referring Physician: McLean-Scocuzza, Nino Glow, MD   Reason(s) for Hospitalization  RESP FAILURE COVID 19 pneumonia  Diagnoses  Preliminary cause of death: COVID 17 INFECTION, PNEUMONIA< COPD, POST INFLAMMATORY FIBROSIS Secondary Diagnoses (including complications and co-morbidities):  Active Problems:   HTN (hypertension), benign   Acute respiratory failure with hypoxemia ([facility])   Uncontrolled type 2 diabetes mellitus with hyperglycemia ([facility])   Goals of care, counseling/discussion   DNR (do not resuscitate)   Palliative care encounter   Brief Hospital Course (including significant findings, care, treatment, and services provided and events leading to death)   74 year old male admitted Nov 01, 2019 with acute on chronic hypoxic respiratory failure secondary to post COVID-19 pneumonitis/fibrosis.  On 1/28 he was noted to have acute respiratory distress and severe hypoxia requiring transfer to stepdown for BiPAP.  CTA chest 1/28 negative for PE, however with progressive pneumonitis/fibrosis. Questionable superimposed pneumonia. High risk for intubation.  SIGNIFICANT EVENTS  1/25-admission to [facility] 1/26- PCCM consulted for worsening hypoxia 1/27- Transferred to [facility] 1/28- Acute Respiratory Distress & Hypoxia; transfer back to Stepdown for BiPAP  STUDIES:  10-31-2022- CT Head w/o Contrast>>1. Stable age related cerebral atrophy, ventriculomegaly and periventricular white matter disease. 2. No acute intracranial findings or mass lesions. 2022/10/31- CTA Chest>> 1. No evidence of acute pulmonary embolism or other acute vascular process. 2. Chronic lung disease with progressive subpleural reticulation and patchy ground-glass opacities compared with previous CT  of 06/13/2018. These findings likely relate to the patient's recent COVID-19 infection (residual inflammation versus postinflammatory scarring). 3. Sequela of prior granulomatous disease. 4. Stable central enlargement of the pulmonary arteries consistent with pulmonary arterial hypertension. Coronary and Aortic Atherosclerosis 1/28- CTA Chest>> 1. Progressive pneumonitis compared to 3 days ago. There also some coarse opacities with mild traction bronchiectasis suggesting superimposed fibrotic process. 2. Negative for pulmonary embolism.  CULTURES: Blood cultures 1/25>> no growth to date Urine 1/25>> Enterococcus faecalis Blood cultures 1/28>> Strep pneumo urinary antigen 1/28>> Legionella urinary antigen 1/28>>  ANTIBIOTICS: Vancomycin 10/31/2022>> 10-31-22 Cefepime 1/28>> Vancomycin 1/28>>  Family At bedside, clinical status relayed to family  Updated and notified of patients medical condition-  Progressive multiorgan failure with very low chance of meaningful recovery.  Patient is in dying  Process.  Family understands the situation.  They have consented and agreed to DNR/DNI and would like to proceed with Comfort care measures.   Family are satisfied with Plan of action and management. All questions answered      Pertinent Labs and Studies  Significant Diagnostic Studies cxr 1v 5am  Result Date: 2019/10/15 CLINICAL DATA:  Acute respiratory disease due to COVID-19. EXAM: CHEST  1 VIEW COMPARISON:  09/12/2019. FINDINGS: Prior median sternotomy. Cardiomegaly. Diffuse bilateral pulmonary infiltrates/edema again noted. New infiltrate noted in the left upper lung. No prominent pleural effusion. No pneumothorax. IMPRESSION: 1.  Prior median sternotomy.  Stable cardiomegaly. 2. Diffuse bilateral pulmonary infiltrates/edema again noted. New infiltrate noted the left upper lung. Patient is known COVID-19 positive. Electronically Signed   By: Marcello Moores  Register   On: 2019/10/15 07:06   CT  Head Wo Contrast  Result Date: 2019/11/01 CLINICAL DATA:  Headache and shortness of breath. EXAM: CT HEAD WITHOUT CONTRAST TECHNIQUE: Contiguous axial images were obtained from the base of the skull  through the vertex without intravenous contrast. COMPARISON:  Head CT 06/13/2018 FINDINGS: Brain: Stable age related cerebral atrophy, ventriculomegaly and periventricular white matter disease. No extra-axial fluid collections are identified. No CT findings for acute hemispheric infarction or intracranial hemorrhage. No mass lesions. The brainstem and cerebellum are normal. Vascular: No hyperdense vessels or aneurysm. Stable moderate vascular calcifications. Skull: No skull fracture or bone lesion. Sinuses/Orbits: The paranasal sinuses and mastoid air cells are clear. The globes are intact. Other: No scalp lesions or hematoma. IMPRESSION: 1. Stable age related cerebral atrophy, ventriculomegaly and periventricular white matter disease. 2. No acute intracranial findings or mass lesions. Electronically Signed   By: Marijo Sanes M.D.   On: 09/28/2019 16:20   CT ANGIO CHEST PE W OR WO CONTRAST  Result Date: 10/11/2019 CLINICAL DATA:  Shortness of breath with sign 0 CIS EXAM: CT ANGIOGRAPHY CHEST WITH CONTRAST TECHNIQUE: Multidetector CT imaging of the chest was performed using the standard protocol during bolus administration of intravenous contrast. Multiplanar CT image reconstructions and MIPs were obtained to evaluate the vascular anatomy. CONTRAST:  24m OMNIPAQUE IOHEXOL 350 MG/ML SOLN COMPARISON:  Three days ago FINDINGS: Cardiovascular: Cardiomegaly. Extensive aortic and coronary atherosclerosis status post CABG. No pulmonary artery filling defect or acute aortic finding. Mediastinum/Nodes: Negative for adenopathy or mass. No pneumomediastinum Lungs/Pleura: Patchy ground-glass opacity superimposed on fibrotic appearing coarse interstitial opacities with low lung volumes. This alveolitis appearance has  progressed. No consolidation, effusion, or generalized septal thickening. Calcified granulomas in the left lower lobe. Upper Abdomen: Granulomatous calcifications. Cholecystectomy and right renal artery stenting. Musculoskeletal: No acute or aggressive finding. Review of the MIP images confirms the above findings. IMPRESSION: 1. Progressive pneumonitis compared to 3 days ago. There also some coarse opacities with mild traction bronchiectasis suggesting superimposed fibrotic process. 2. Negative for pulmonary embolism. Electronically Signed   By: JMonte FantasiaM.D.   On: 10/11/2019 05:13   CT Angio Chest PE W and/or Wo Contrast  Result Date: 10/10/2019 CLINICAL DATA:  Shortness of breath and hypoxemia. Diagnosed with COVID-19 infection 3 weeks ago. History of diabetes and congestive heart failure. EXAM: CT ANGIOGRAPHY CHEST WITH CONTRAST TECHNIQUE: Multidetector CT imaging of the chest was performed using the standard protocol during bolus administration of intravenous contrast. Multiplanar CT image reconstructions and MIPs were obtained to evaluate the vascular anatomy. CONTRAST:  790mOMNIPAQUE IOHEXOL 350 MG/ML SOLN COMPARISON:  Chest CT 06/13/2018. FINDINGS: Cardiovascular: The pulmonary arteries are well opacified with contrast to the level of the subsegmental branches. There is no evidence of acute pulmonary embolism. There is stable central enlargement of the pulmonary arteries consistent with pulmonary arterial hypertension. There is diffuse atherosclerosis of the aorta, great vessels and coronary arteries status post median sternotomy and CABG. The heart is mildly enlarged. There is no significant pericardial effusion. Mediastinum/Nodes: There are no enlarged mediastinal, hilar or axillary lymph nodes.Small chronic calcified mediastinal and hilar lymph nodes are present. The thyroid gland, trachea and esophagus demonstrate no significant findings. Lungs/Pleura: Trace bilateral pleural effusions. No  pneumothorax. There is diffuse subpleural reticulation in both lungs with patchy ground-glass opacities, greatest in the upper lobes and left lower lobe. There are calcified lower lobe granulomas bilaterally. Upper abdomen: No acute findings. There is mild contour irregularity of the liver. There are scattered calcified granulomas within the liver and spleen. No adrenal mass. Musculoskeletal/Chest wall: There is no chest wall mass or suspicious osseous finding. Review of the MIP images confirms the above findings. IMPRESSION: 1. No evidence of acute pulmonary  embolism or other acute vascular process. 2. Chronic lung disease with progressive subpleural reticulation and patchy ground-glass opacities compared with previous CT of 06/13/2018. These findings likely relate to the patient's recent COVID-19 infection (residual inflammation versus postinflammatory scarring). 3. Sequela of prior granulomatous disease. 4. Stable central enlargement of the pulmonary arteries consistent with pulmonary arterial hypertension. Coronary and Aortic Atherosclerosis (ICD10-I70.0). Electronically Signed   By: Richardean Sale M.D.   On: 09/28/2019 16:29   DG Chest Port 1 View  Result Date: 10/13/2019 CLINICAL DATA:  Acute on chronic respiratory failure. EXAM: PORTABLE CHEST 1 VIEW COMPARISON:  10/10/2019 FINDINGS: There are worsening bilateral airspace opacities most evident in the right lower lung zone. No pneumothorax. There may be small bilateral pleural effusions. There is no pneumothorax. The heart size remains enlarged. The patient is status post prior median sternotomy. IMPRESSION: Interval worsening of bilateral airspace opacities, most evident in the right lower lung zone. Electronically Signed   By: Constance Holster M.D.   On: 10/13/2019 20:25   DG Chest Port 1 View  Result Date: 10/10/2019 CLINICAL DATA:  Shortness of breath. Smoking history. Coronavirus infection. EXAM: PORTABLE CHEST 1 VIEW COMPARISON:  09/24/2019  FINDINGS: Radiographic worsening of widespread patchy bronchopneumonia throughout both lungs. No dense consolidation or lobar collapse. No visible effusion. Previous median sternotomy and CABG. Cardiomegaly. IMPRESSION: Radiographic worsening of widespread pulmonary infiltrates. Electronically Signed   By: Nelson Chimes M.D.   On: 10/10/2019 18:44   DG Chest Portable 1 View  Result Date: 09/24/2019 CLINICAL DATA:  Shortness of breath.  Recent COVID-19 positive EXAM: PORTABLE CHEST 1 VIEW COMPARISON:  September 21, 2019. FINDINGS: There is multifocal airspace opacity throughout the lungs bilaterally with the greatest degree of opacification in the left lower lobe. Compared to the previous study, there has been slight clearing on the left with essentially stable changes on the right. Heart is borderline enlarged with pulmonary vascularity normal. Patient is status post median sternotomy. No adenopathy. There is aortic atherosclerosis. Postoperative changes noted in the right shoulder. IMPRESSION: Multifocal airspace opacity with overall slightly less opacity on the left compared to the previous study. Currently the greatest degree of airspace opacity is in the left lower lobe region. Heart borderline enlarged, stable. Postoperative changes noted. No adenopathy. Aortic Atherosclerosis (ICD10-I70.0). Electronically Signed   By: Lowella Grip III M.D.   On: 09/27/2019 15:15   [place] EKG SITE RITE  Result Date: 10/11/2019 If Site Rite image not attached, placement could not be confirmed due to current cardiac rhythm.   Microbiology Recent Results (from the past 240 hour(s))  Blood culture (routine x 2)     Status: None   Collection Time: 10/09/2019  2:59 PM   Specimen: BLOOD  Result Value Ref Range Status   Specimen Description BLOOD RIGHT ANTECUBITAL  Final   Special Requests   Final    BOTTLES DRAWN AEROBIC AND ANAEROBIC Blood Culture adequate volume   Culture   Final    NO GROWTH 5 DAYS Performed at  [facility], [address], [place], [place] [address]    Report Status 10/13/2019 FINAL  Final  Urine culture     Status: Abnormal   Collection Time: 10/11/2019  3:24 PM   Specimen: Urine, Random  Result Value Ref Range Status   Specimen Description   Final    URINE, RANDOM Performed at [facility], [address], [place], [place] [address]    Special Requests   Final    NONE  Performed at [facility], [address] [place], [place] [address]    Culture >=100,000 COLONIES/mL ENTEROCOCCUS FAECALIS (A)  Final   Report Status 10/11/2019 FINAL  Final   Organism ID, Bacteria ENTEROCOCCUS FAECALIS (A)  Final      Susceptibility   Enterococcus faecalis - MIC*    AMPICILLIN <=2 SENSITIVE Sensitive     NITROFURANTOIN <=16 SENSITIVE Sensitive     VANCOMYCIN 1 SENSITIVE Sensitive     * >=100,000 COLONIES/mL ENTEROCOCCUS FAECALIS  Blood culture (routine x 2)     Status: None   Collection Time: 10/12/2019  3:53 PM   Specimen: BLOOD  Result Value Ref Range Status   Specimen Description BLOOD BLOOD LEFT HAND  Final   Special Requests   Final    BOTTLES DRAWN AEROBIC AND ANAEROBIC Blood Culture adequate volume   Culture   Final    NO GROWTH 5 DAYS Performed at [facility], [address] [place], [place] [address]    Report Status 10/13/2019 FINAL  Final  CULTURE, BLOOD (ROUTINE X 2) w Reflex to ID Panel     Status: None (Preliminary result)   Collection Time: 10/11/19  3:24 AM   Specimen: BLOOD  Result Value Ref Range Status   Specimen Description BLOOD BLOOD RIGHT HAND  Final   Special Requests   Final    BOTTLES DRAWN AEROBIC AND ANAEROBIC Blood Culture adequate volume   Culture   Final    NO GROWTH 3 DAYS Performed at [facility], [address] [place], [place] [address]    Report Status PENDING  Incomplete  CULTURE, BLOOD (ROUTINE X 2) w Reflex to ID Panel     Status: None (Preliminary result)   Collection Time: 10/11/19   3:24 AM   Specimen: BLOOD  Result Value Ref Range Status   Specimen Description BLOOD BLOOD RIGHT HAND  Final   Special Requests   Final    BOTTLES DRAWN AEROBIC AND ANAEROBIC Blood Culture adequate volume   Culture   Final    NO GROWTH 3 DAYS Performed at [facility], [address] [place], [place] [address]    Report Status PENDING  Incomplete    Lab Basic Metabolic Panel: Recent Labs  Lab 10/09/19 0051 10/09/19 0051 10/10/19 0555 10/11/19 0045 10/12/19 0416 10/13/19 0439 2019/10/21 0406  NA 132*   < > 133* 134* 137 137 134*  K 2.9*   < > 3.6 3.6 3.8 4.5 5.2*  CL 94*   < > 95* 95* 96* 98 98  CO2 29   < > 29 24 29 28 28   GLUCOSE 338*   < > 293* 178* 145* 158* 210*  BUN 15   < > 13 22 22  24* 26*  CREATININE 1.10   < > 0.99 1.18 0.94 0.91 0.90  CALCIUM 8.1*   < > 8.9 9.0 8.7* 8.6* 8.9  MG 1.4*  --  1.8 1.7 2.4 2.2  --   PHOS  --   --   --   --  2.4* 2.2* 2.6   < > = values in this interval not displayed.   Liver Function Tests: Recent Labs  Lab 09/17/2019 1452  AST 56*  ALT 25  ALKPHOS 51  BILITOT 1.3*  PROT 7.2  ALBUMIN 3.0*   No results for input(s): LIPASE, AMYLASE in the last 168 hours. No results for input(s): AMMONIA in the last 168 hours. CBC: Recent Labs  Lab 09/30/2019 1452 10/09/19 0051  10/10/19 0555 10/11/19 0045 10/12/19 0416 10/13/19 0439 10/15/19 0406  WBC 9.8   < > 10.6* 16.9* 15.0* 11.9* 13.2*  NEUTROABS 7.2  --   --   --   --  10.1* 11.2*  HGB 13.1   < > 13.4 12.7* 13.1 11.7* 12.1*  HCT 38.0*   < > 37.7* 36.5* 39.8 35.0* 36.2*  MCV 101.1*   < > 99.2 99.2 103.1* 102.3* 102.3*  PLT 275   < > 278 334 319 306 355   < > = values in this interval not displayed.   Cardiac Enzymes: No results for input(s): CKTOTAL, CKMB, CKMBINDEX, TROPONINI in the last 168 hours. Sepsis Labs: Recent Labs  Lab 09/27/2019 1452 10/12/2019 1459 10/04/2019 1600 10/09/19 0039 10/09/19 0051 10/09/19 0051 10/10/19 0555 10/10/19 0555 10/11/19 0045  10/11/19 0531 10/11/19 0844 10/12/19 0416 10/13/19 0439 2019-10-15 0406  PROCALCITON  --  <0.10  --   --  0.16  --  0.10  --   --  0.29  --   --   --   --   WBC   < >  --   --   --  11.8*   < > 10.6*   < > 16.9*  --   --  15.0* 11.9* 13.2*  LATICACIDVEN  --   --  1.9 1.5  --   --   --   --   --  1.7 1.3  --   --   --    < > = values in this interval not displayed.    Flora Lipps 10-15-19, 12:24 PM

## 2019-10-15 NOTE — Progress Notes (Signed)
Family At bedside, clinical status relayed to family  Updated and notified of patients medical condition-  Progressive multiorgan failure with very low chance of meaningful recovery.  Patient is in dying  Process.  Family understands the situation.  They have consented and agreed to DNR/DNI and would like to proceed with Comfort care measures.   Family are satisfied with Plan of action and management. All questions answered  Corrin Parker, M.D.  [facility]  Medical Director [place] Director [facility] Cardio-Pulmonary Department

## 2019-10-15 NOTE — Progress Notes (Signed)
Family was at bedside.  Patient passed away at 12:03, pronounced by Apolinar Junes and Lorn Junes.  Phillis Knack, RN

## 2019-10-15 DEATH — deceased

## 2019-10-16 LAB — CULTURE, BLOOD (ROUTINE X 2)
Culture: NO GROWTH
Culture: NO GROWTH
Special Requests: ADEQUATE
Special Requests: ADEQUATE

## 2019-10-17 ENCOUNTER — Telehealth: Payer: Self-pay | Admitting: Pulmonary Disease

## 2019-10-17 NOTE — Telephone Encounter (Signed)
Spoke to pt's wife, Eula(DPR) and gave our condolences.  Dr. Patsey Berthold has been made aware verbally.  Nothing further is needed.

## 2019-11-14 ENCOUNTER — Ambulatory Visit: Payer: Medicare Other | Admitting: Internal Medicine

## 2020-01-13 IMAGING — DX DG ANKLE COMPLETE 3+V*L*
3 series · 3 of 3 positions shown · non-contrast
Comparison: None.

CLINICAL DATA: Fall, left leg pain

EXAM:
LEFT ANKLE COMPLETE - 3+ VIEW

[ankle ap]
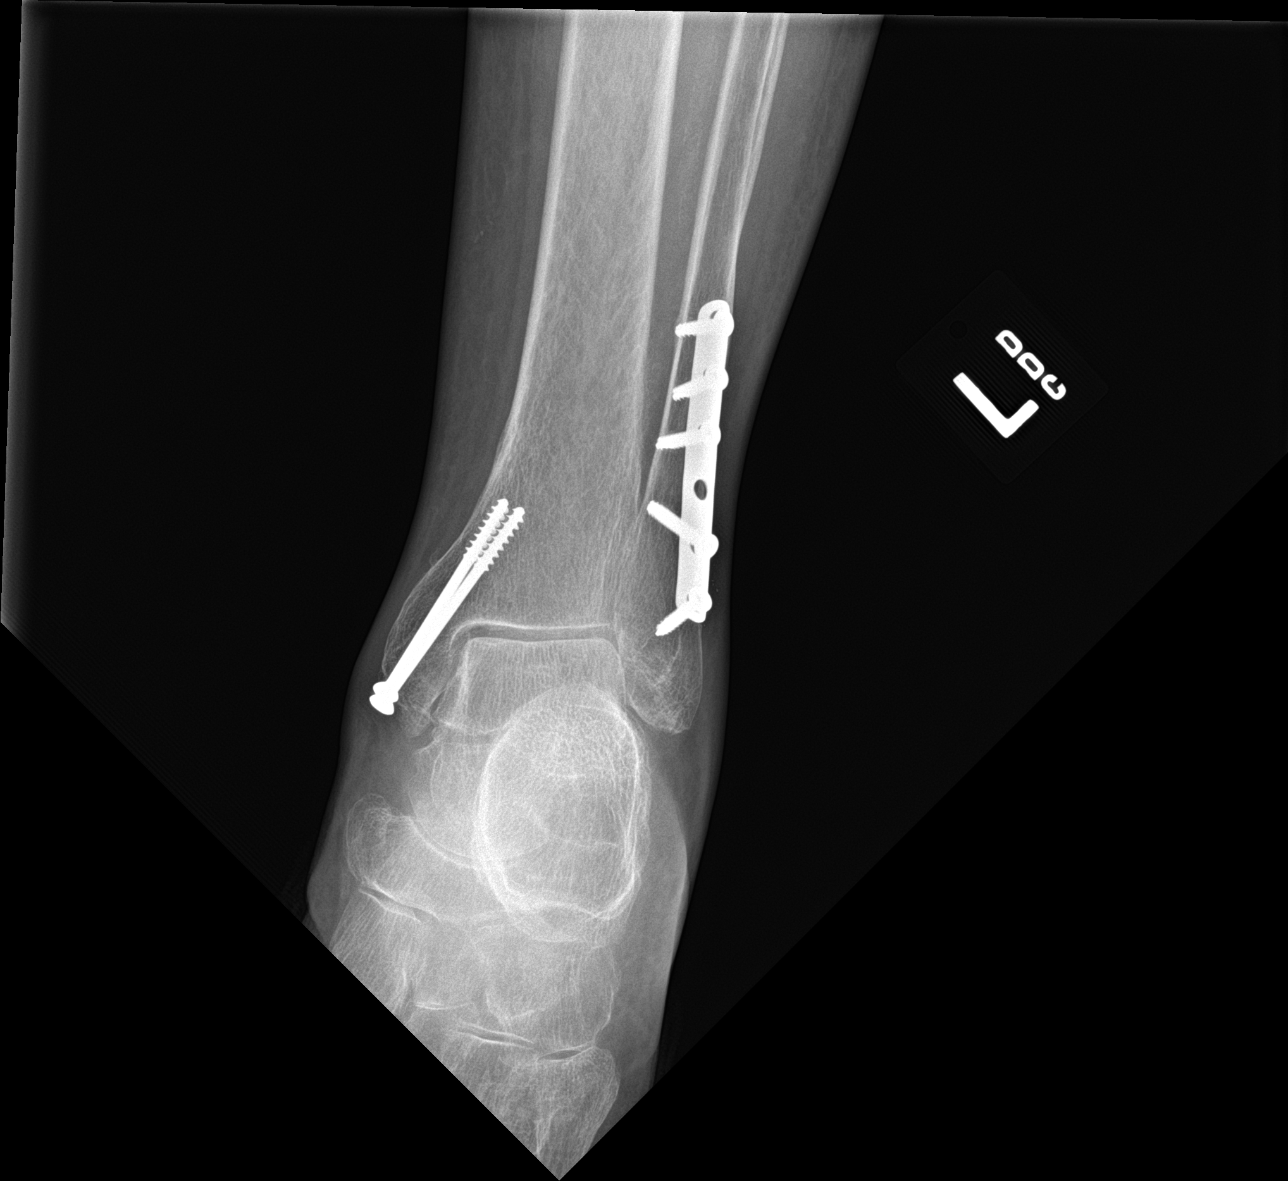

[ankle obl]
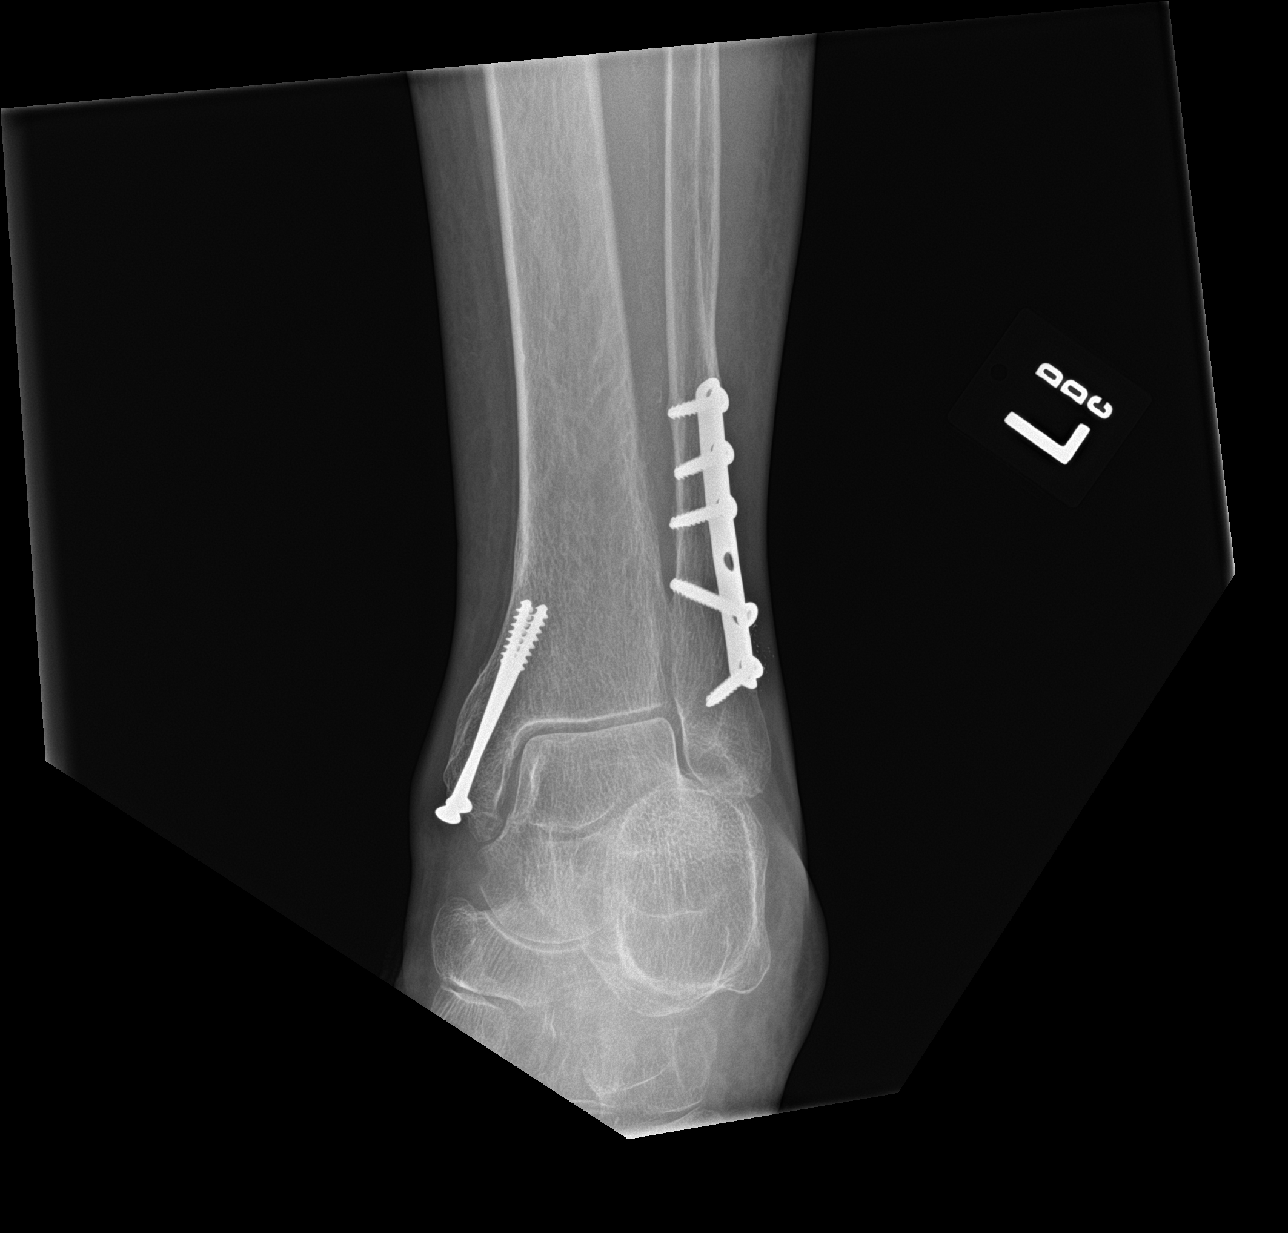

[ankle lat]
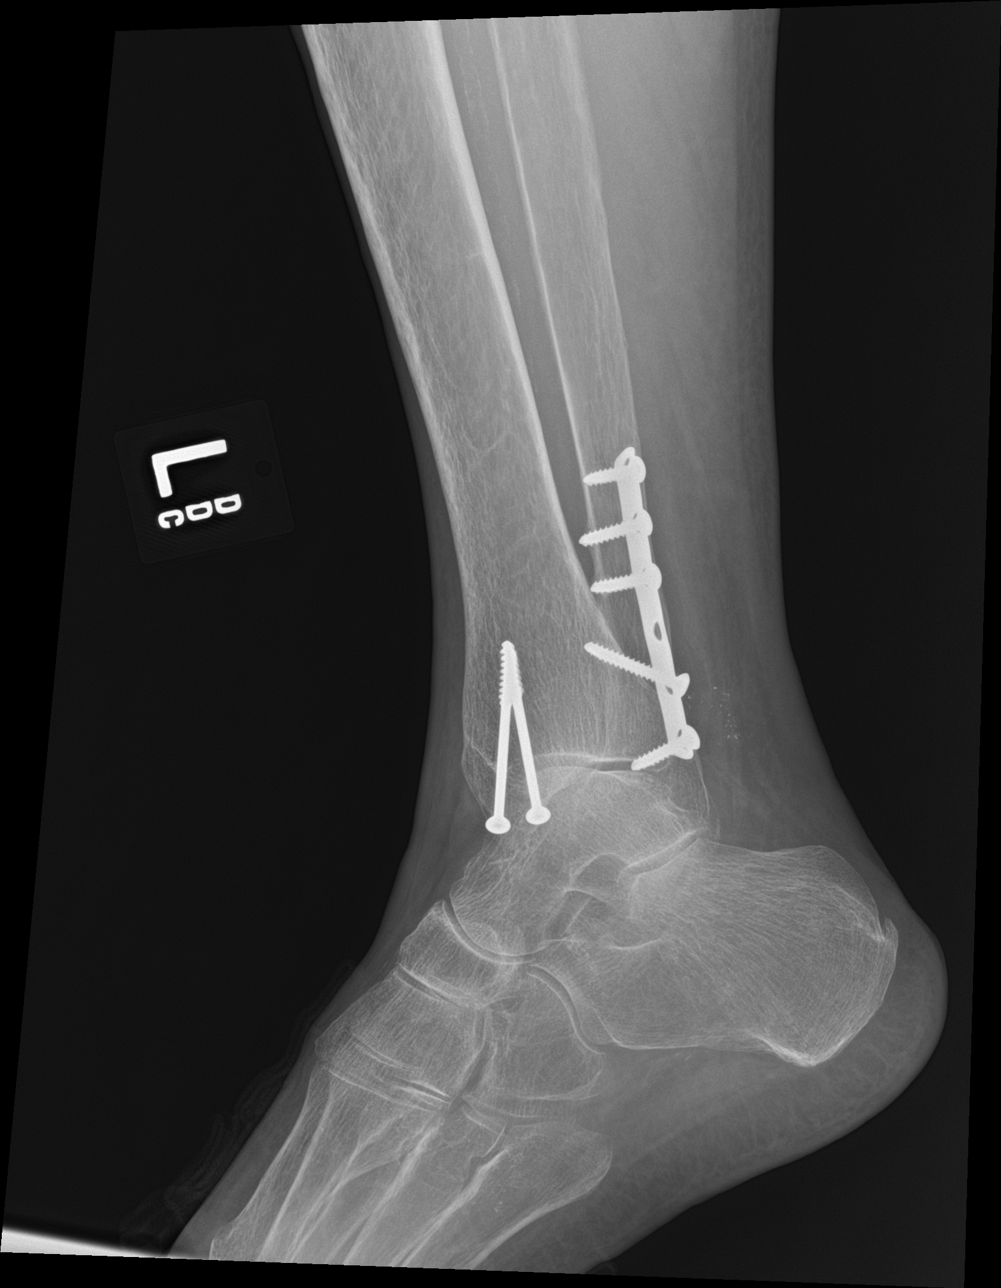

[3 of 3 positions shown; findings below may reference images not displayed]

FINDINGS: Lateral plate and screw fixation of the distal fibula.

Two medial malleolar screws.

No fracture or dislocation is seen.

The ankle mortise is intact.

The visualized soft tissues are unremarkable.
IMPRESSION: No fracture or dislocation is seen.

Status post ORIF of the distal fibula and medial malleolus.

## 2020-01-13 IMAGING — DX DG KNEE 1-2V*L*
2 series · 2 of 2 positions shown · non-contrast
Comparison: None.

CLINICAL DATA: Fell in shower.  LEFT knee pain.

EXAM:
LEFT KNEE - 1-2 VIEW

[knee ap]
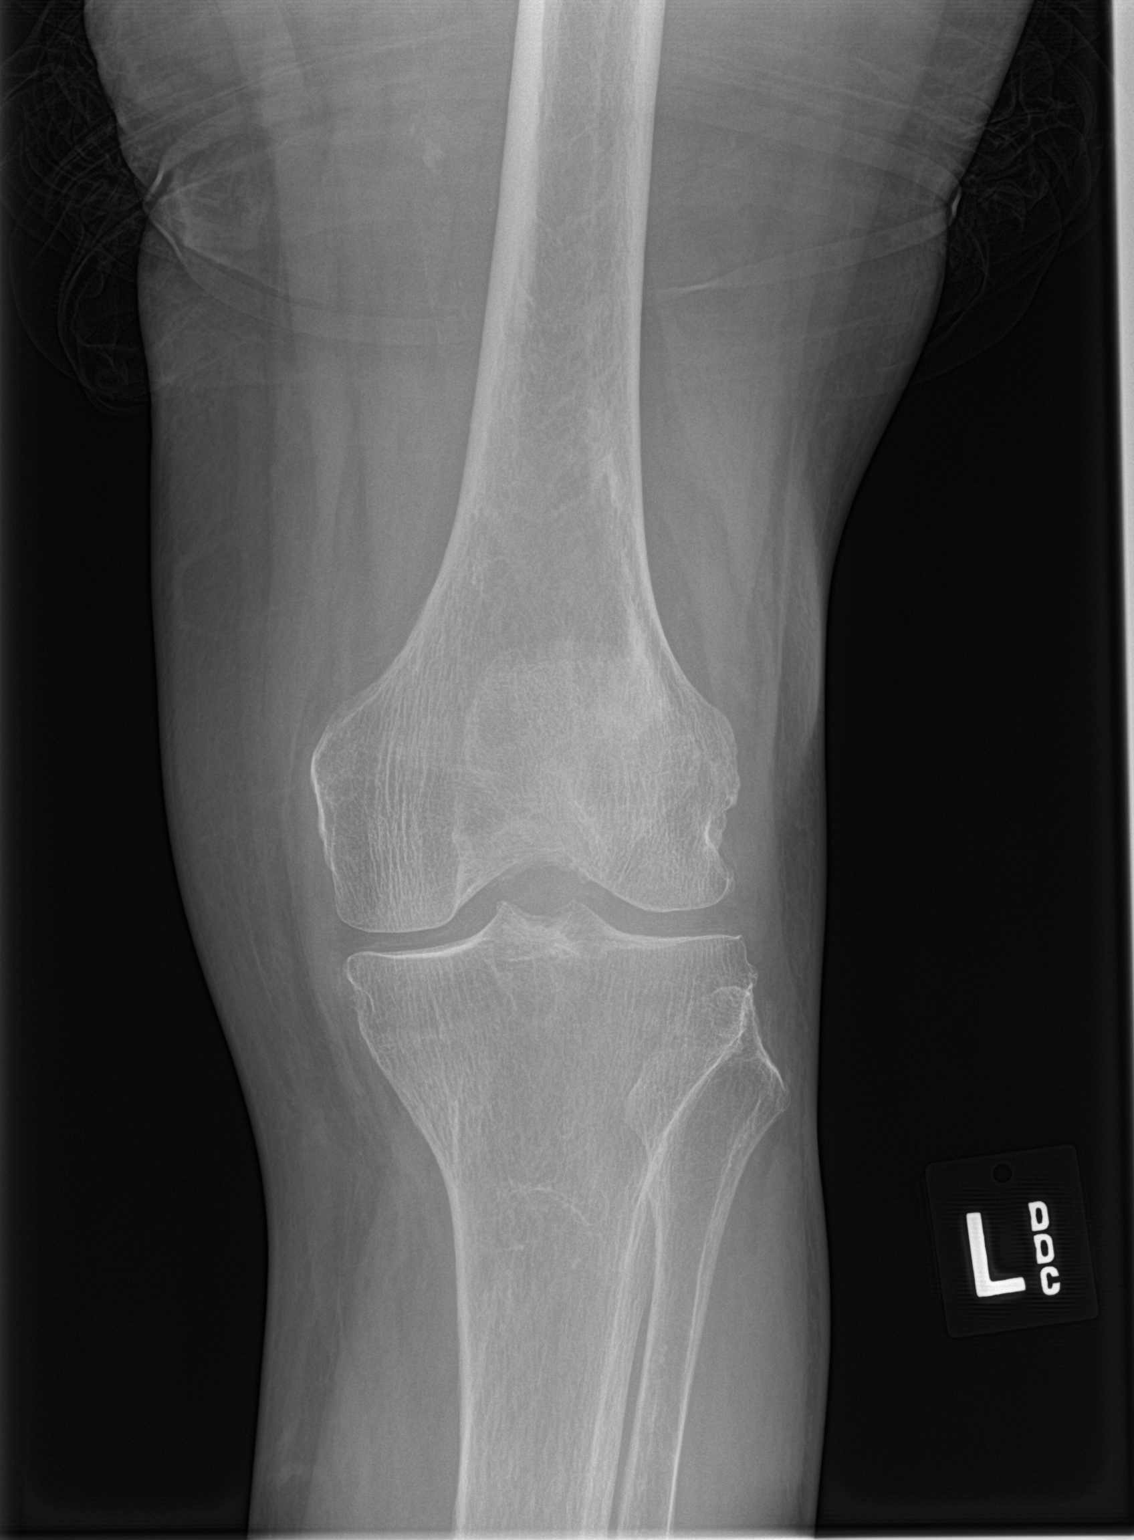

[knee lat]
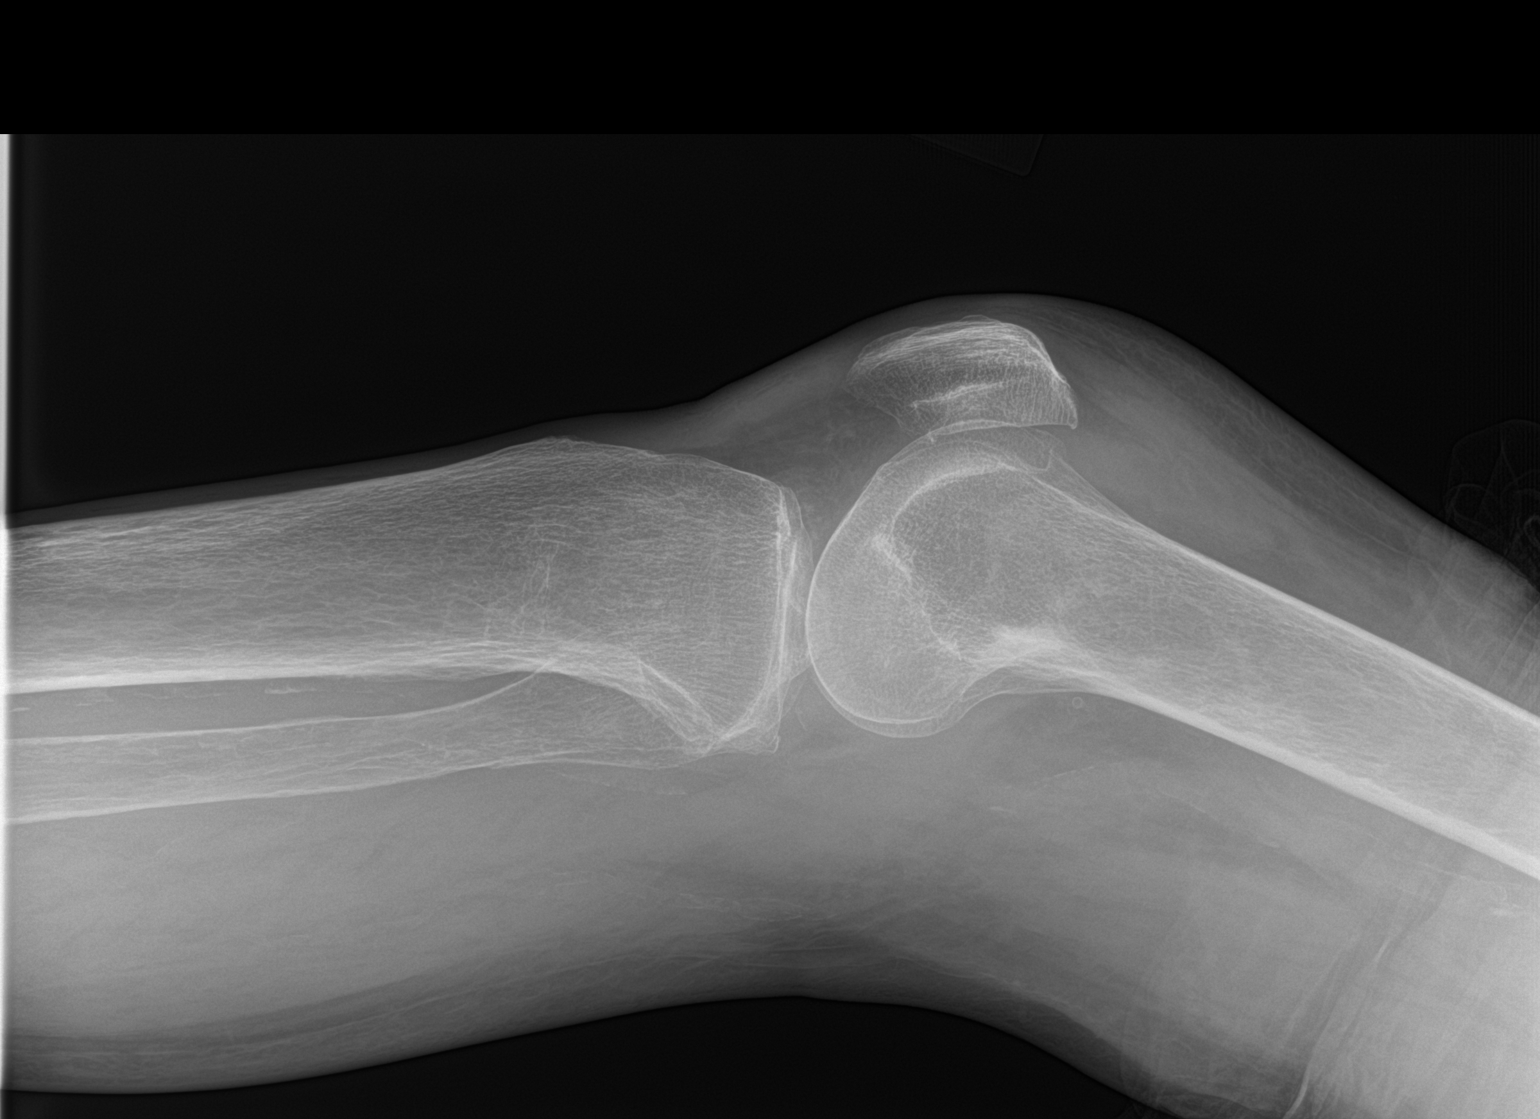

[2 of 2 positions shown; findings below may reference images not displayed]

FINDINGS: No evidence of fracture, dislocation, or joint effusion. Osteopenia.
No advanced arthropathy or other focal bone abnormality. Small
suprapatellar joint effusion. Mild vascular calcifications.
IMPRESSION: Small suprapatellar joint effusion without acute osseous process.

## 2020-07-04 ENCOUNTER — Ambulatory Visit: Payer: Medicare Other
